# Patient Record
Sex: Female | Born: 1945 | Race: White | Hispanic: No | Marital: Married | Smoking: Former smoker
Health system: Southern US, Community
[De-identification: ages and names within clinical notes are randomized; demographics above are authoritative.]

## PROBLEM LIST (undated history)

## (undated) DIAGNOSIS — I4729 Other ventricular tachycardia: Secondary | ICD-10-CM

## (undated) DIAGNOSIS — I5022 Chronic systolic (congestive) heart failure: Secondary | ICD-10-CM

## (undated) DIAGNOSIS — H40009 Preglaucoma, unspecified, unspecified eye: Secondary | ICD-10-CM

## (undated) DIAGNOSIS — Z9289 Personal history of other medical treatment: Secondary | ICD-10-CM

## (undated) DIAGNOSIS — I428 Other cardiomyopathies: Secondary | ICD-10-CM

## (undated) DIAGNOSIS — I2510 Atherosclerotic heart disease of native coronary artery without angina pectoris: Secondary | ICD-10-CM

## (undated) DIAGNOSIS — J189 Pneumonia, unspecified organism: Secondary | ICD-10-CM

## (undated) DIAGNOSIS — I471 Supraventricular tachycardia: Secondary | ICD-10-CM

## (undated) DIAGNOSIS — Z8719 Personal history of other diseases of the digestive system: Secondary | ICD-10-CM

## (undated) DIAGNOSIS — J302 Other seasonal allergic rhinitis: Secondary | ICD-10-CM

## (undated) DIAGNOSIS — T8859XA Other complications of anesthesia, initial encounter: Secondary | ICD-10-CM

## (undated) DIAGNOSIS — G629 Polyneuropathy, unspecified: Secondary | ICD-10-CM

## (undated) DIAGNOSIS — R000 Tachycardia, unspecified: Secondary | ICD-10-CM

## (undated) DIAGNOSIS — R0600 Dyspnea, unspecified: Secondary | ICD-10-CM

## (undated) DIAGNOSIS — Z8701 Personal history of pneumonia (recurrent): Secondary | ICD-10-CM

## (undated) DIAGNOSIS — I447 Left bundle-branch block, unspecified: Secondary | ICD-10-CM

## (undated) DIAGNOSIS — T4145XA Adverse effect of unspecified anesthetic, initial encounter: Secondary | ICD-10-CM

## (undated) DIAGNOSIS — Z9889 Other specified postprocedural states: Secondary | ICD-10-CM

## (undated) DIAGNOSIS — Z95810 Presence of automatic (implantable) cardiac defibrillator: Secondary | ICD-10-CM

## (undated) DIAGNOSIS — I472 Ventricular tachycardia: Secondary | ICD-10-CM

## (undated) DIAGNOSIS — I4719 Other supraventricular tachycardia: Secondary | ICD-10-CM

## (undated) DIAGNOSIS — R112 Nausea with vomiting, unspecified: Secondary | ICD-10-CM

## (undated) DIAGNOSIS — M069 Rheumatoid arthritis, unspecified: Secondary | ICD-10-CM

## (undated) DIAGNOSIS — E119 Type 2 diabetes mellitus without complications: Secondary | ICD-10-CM

## (undated) DIAGNOSIS — D649 Anemia, unspecified: Secondary | ICD-10-CM

## (undated) DIAGNOSIS — E785 Hyperlipidemia, unspecified: Secondary | ICD-10-CM

## (undated) DIAGNOSIS — I4711 Inappropriate sinus tachycardia, so stated: Secondary | ICD-10-CM

## (undated) DIAGNOSIS — N189 Chronic kidney disease, unspecified: Secondary | ICD-10-CM

## (undated) DIAGNOSIS — E0590 Thyrotoxicosis, unspecified without thyrotoxic crisis or storm: Secondary | ICD-10-CM

## (undated) DIAGNOSIS — K219 Gastro-esophageal reflux disease without esophagitis: Secondary | ICD-10-CM

## (undated) DIAGNOSIS — M109 Gout, unspecified: Secondary | ICD-10-CM

## (undated) DIAGNOSIS — I219 Acute myocardial infarction, unspecified: Secondary | ICD-10-CM

## (undated) DIAGNOSIS — H3530 Unspecified macular degeneration: Secondary | ICD-10-CM

## (undated) DIAGNOSIS — I10 Essential (primary) hypertension: Secondary | ICD-10-CM

## (undated) HISTORY — DX: Type 2 diabetes mellitus without complications: E11.9

## (undated) HISTORY — PX: FRACTURE SURGERY: SHX138

## (undated) HISTORY — DX: Inappropriate sinus tachycardia, so stated: I47.11

## (undated) HISTORY — PX: EYE SURGERY: SHX253

## (undated) HISTORY — PX: FOOT SURGERY: SHX648

## (undated) HISTORY — PX: BACK SURGERY: SHX140

## (undated) HISTORY — PX: TONSILLECTOMY: SUR1361

## (undated) HISTORY — PX: APPENDECTOMY: SHX54

## (undated) HISTORY — DX: Other cardiomyopathies: I42.8

## (undated) HISTORY — DX: Rheumatoid arthritis, unspecified: M06.9

## (undated) HISTORY — DX: Atherosclerotic heart disease of native coronary artery without angina pectoris: I25.10

## (undated) HISTORY — DX: Other ventricular tachycardia: I47.29

## (undated) HISTORY — PX: HERNIA REPAIR: SHX51

## (undated) HISTORY — DX: Preglaucoma, unspecified, unspecified eye: H40.009

## (undated) HISTORY — PX: ABDOMINAL HYSTERECTOMY: SHX81

## (undated) HISTORY — DX: Supraventricular tachycardia: I47.1

## (undated) HISTORY — DX: Polyneuropathy, unspecified: G62.9

## (undated) HISTORY — DX: Other supraventricular tachycardia: I47.19

## (undated) HISTORY — DX: Chronic systolic (congestive) heart failure: I50.22

## (undated) HISTORY — PX: PTCA: SHX146

## (undated) HISTORY — DX: Tachycardia, unspecified: R00.0

## (undated) HISTORY — DX: Left bundle-branch block, unspecified: I44.7

## (undated) HISTORY — DX: Essential (primary) hypertension: I10

## (undated) HISTORY — DX: Ventricular tachycardia: I47.2

## (undated) HISTORY — PX: CHOLECYSTECTOMY: SHX55

## (undated) HISTORY — DX: Anemia, unspecified: D64.9

## (undated) HISTORY — DX: Hyperlipidemia, unspecified: E78.5

## (undated) MED FILL — Ferumoxytol Inj 510 MG/17ML (30 MG/ML) (Elemental Fe): INTRAVENOUS | Qty: 17 | Status: AC

---

## 1994-11-20 HISTORY — PX: CORONARY ARTERY BYPASS GRAFT: SHX141

## 2001-10-20 ENCOUNTER — Emergency Department (HOSPITAL_COMMUNITY): Admission: EM | Admit: 2001-10-20 | Discharge: 2001-10-20 | Payer: Self-pay | Admitting: Emergency Medicine

## 2002-04-24 ENCOUNTER — Ambulatory Visit (HOSPITAL_COMMUNITY): Admission: RE | Admit: 2002-04-24 | Discharge: 2002-04-24 | Payer: Self-pay | Admitting: Family Medicine

## 2002-04-24 ENCOUNTER — Encounter: Payer: Self-pay | Admitting: Family Medicine

## 2004-02-24 ENCOUNTER — Ambulatory Visit (HOSPITAL_COMMUNITY): Admission: RE | Admit: 2004-02-24 | Discharge: 2004-02-24 | Payer: Self-pay

## 2004-03-29 ENCOUNTER — Encounter: Admission: RE | Admit: 2004-03-29 | Discharge: 2004-06-27 | Payer: Self-pay | Admitting: Family Medicine

## 2004-04-28 ENCOUNTER — Ambulatory Visit (HOSPITAL_COMMUNITY): Admission: RE | Admit: 2004-04-28 | Discharge: 2004-04-28 | Payer: Self-pay | Admitting: Family Medicine

## 2004-08-25 ENCOUNTER — Encounter: Admission: RE | Admit: 2004-08-25 | Discharge: 2004-11-23 | Payer: Self-pay | Admitting: Family Medicine

## 2004-12-12 ENCOUNTER — Ambulatory Visit: Payer: Self-pay | Admitting: Internal Medicine

## 2004-12-19 ENCOUNTER — Ambulatory Visit: Payer: Self-pay

## 2005-02-08 ENCOUNTER — Ambulatory Visit: Payer: Self-pay | Admitting: Cardiovascular Disease

## 2005-02-13 ENCOUNTER — Ambulatory Visit: Payer: Self-pay | Admitting: Cardiology

## 2005-02-24 ENCOUNTER — Ambulatory Visit (HOSPITAL_COMMUNITY): Admission: RE | Admit: 2005-02-24 | Discharge: 2005-02-24 | Payer: Self-pay | Admitting: Family Medicine

## 2005-03-06 ENCOUNTER — Encounter: Admission: RE | Admit: 2005-03-06 | Discharge: 2005-03-06 | Payer: Self-pay | Admitting: Family Medicine

## 2005-08-02 ENCOUNTER — Ambulatory Visit: Payer: Self-pay | Admitting: Cardiology

## 2005-08-07 ENCOUNTER — Ambulatory Visit: Payer: Self-pay | Admitting: Cardiology

## 2005-08-31 ENCOUNTER — Encounter: Admission: RE | Admit: 2005-08-31 | Discharge: 2005-08-31 | Payer: Self-pay | Admitting: Family Medicine

## 2006-02-05 ENCOUNTER — Ambulatory Visit: Payer: Self-pay | Admitting: Cardiology

## 2006-02-08 ENCOUNTER — Ambulatory Visit: Payer: Self-pay | Admitting: Cardiology

## 2006-02-27 ENCOUNTER — Encounter: Admission: RE | Admit: 2006-02-27 | Discharge: 2006-02-27 | Payer: Self-pay | Admitting: Family Medicine

## 2006-07-24 ENCOUNTER — Ambulatory Visit: Payer: Self-pay | Admitting: Cardiology

## 2006-07-26 ENCOUNTER — Ambulatory Visit: Payer: Self-pay | Admitting: Cardiology

## 2006-10-21 ENCOUNTER — Emergency Department (HOSPITAL_COMMUNITY): Admission: EM | Admit: 2006-10-21 | Discharge: 2006-10-21 | Payer: Self-pay | Admitting: Emergency Medicine

## 2007-02-25 ENCOUNTER — Ambulatory Visit: Payer: Self-pay

## 2007-02-25 LAB — CONVERTED CEMR LAB
ALT: 21 units/L (ref 0–40)
AST: 24 units/L (ref 0–37)
Albumin: 4 g/dL (ref 3.5–5.2)
Alkaline Phosphatase: 36 units/L — ABNORMAL LOW (ref 39–117)
Bilirubin, Direct: 0.1 mg/dL (ref 0.0–0.3)
Cholesterol: 105 mg/dL (ref 0–200)
HDL: 34.7 mg/dL — ABNORMAL LOW (ref >39.0)
LDL Cholesterol: 40 mg/dL (ref 0–99)
Total Bilirubin: 0.7 mg/dL (ref 0.3–1.2)
Total CHOL/HDL Ratio: 3
Total Protein: 7.5 g/dL (ref 6.0–8.3)
Triglycerides: 152 mg/dL — ABNORMAL HIGH (ref 0–149)
VLDL: 30 mg/dL (ref 0–40)

## 2007-02-28 ENCOUNTER — Ambulatory Visit: Payer: Self-pay | Admitting: Cardiology

## 2007-03-04 ENCOUNTER — Ambulatory Visit (HOSPITAL_COMMUNITY): Admission: RE | Admit: 2007-03-04 | Discharge: 2007-03-04 | Payer: Self-pay | Admitting: Family Medicine

## 2007-08-26 ENCOUNTER — Ambulatory Visit: Payer: Self-pay | Admitting: Cardiology

## 2007-08-26 LAB — CONVERTED CEMR LAB
ALT: 21 units/L (ref 0–35)
AST: 27 units/L (ref 0–37)
Albumin: 4 g/dL (ref 3.5–5.2)
Alkaline Phosphatase: 38 units/L — ABNORMAL LOW (ref 39–117)
Bilirubin, Direct: 0.1 mg/dL (ref 0.0–0.3)
Cholesterol: 112 mg/dL (ref 0–200)
HDL: 30 mg/dL — ABNORMAL LOW (ref >39.0)
LDL Cholesterol: 51 mg/dL (ref 0–99)
Total Bilirubin: 0.9 mg/dL (ref 0.3–1.2)
Total CHOL/HDL Ratio: 3.7
Total Protein: 7.4 g/dL (ref 6.0–8.3)
Triglycerides: 156 mg/dL — ABNORMAL HIGH (ref 0–149)
VLDL: 31 mg/dL (ref 0–40)

## 2007-08-29 ENCOUNTER — Ambulatory Visit: Payer: Self-pay | Admitting: Cardiology

## 2007-12-19 ENCOUNTER — Ambulatory Visit: Payer: Self-pay

## 2007-12-19 LAB — CONVERTED CEMR LAB
ALT: 23 units/L (ref 0–35)
AST: 24 units/L (ref 0–37)
Albumin: 4 g/dL (ref 3.5–5.2)
Alkaline Phosphatase: 32 units/L — ABNORMAL LOW (ref 39–117)
Bilirubin, Direct: 0.2 mg/dL (ref 0.0–0.3)
Cholesterol: 112 mg/dL (ref 0–200)
HDL: 30.2 mg/dL — ABNORMAL LOW (ref >39.0)
LDL Cholesterol: 46 mg/dL (ref 0–99)
Total Bilirubin: 0.8 mg/dL (ref 0.3–1.2)
Total CHOL/HDL Ratio: 3.7
Total Protein: 7.2 g/dL (ref 6.0–8.3)
Triglycerides: 177 mg/dL — ABNORMAL HIGH (ref 0–149)
VLDL: 35 mg/dL (ref 0–40)

## 2008-01-02 ENCOUNTER — Ambulatory Visit: Payer: Self-pay | Admitting: Cardiology

## 2008-08-14 ENCOUNTER — Encounter: Admission: RE | Admit: 2008-08-14 | Discharge: 2008-08-14 | Payer: Self-pay | Admitting: Family Medicine

## 2008-08-24 ENCOUNTER — Emergency Department (HOSPITAL_COMMUNITY): Admission: EM | Admit: 2008-08-24 | Discharge: 2008-08-24 | Payer: Self-pay | Admitting: Emergency Medicine

## 2008-09-01 ENCOUNTER — Encounter: Admission: RE | Admit: 2008-09-01 | Discharge: 2008-09-01 | Payer: Self-pay | Admitting: Neurological Surgery

## 2009-04-23 ENCOUNTER — Encounter: Admission: RE | Admit: 2009-04-23 | Discharge: 2009-04-23 | Payer: Self-pay | Admitting: Family Medicine

## 2009-07-07 ENCOUNTER — Encounter: Admission: RE | Admit: 2009-07-07 | Discharge: 2009-07-07 | Payer: Self-pay | Admitting: Family Medicine

## 2009-07-28 DIAGNOSIS — Z951 Presence of aortocoronary bypass graft: Secondary | ICD-10-CM | POA: Insufficient documentation

## 2009-07-28 DIAGNOSIS — E785 Hyperlipidemia, unspecified: Secondary | ICD-10-CM | POA: Insufficient documentation

## 2009-07-28 DIAGNOSIS — E119 Type 2 diabetes mellitus without complications: Secondary | ICD-10-CM | POA: Insufficient documentation

## 2009-07-28 DIAGNOSIS — Z9889 Other specified postprocedural states: Secondary | ICD-10-CM | POA: Insufficient documentation

## 2009-07-28 DIAGNOSIS — Z9089 Acquired absence of other organs: Secondary | ICD-10-CM | POA: Insufficient documentation

## 2010-02-01 ENCOUNTER — Encounter: Admission: RE | Admit: 2010-02-01 | Discharge: 2010-02-01 | Payer: Self-pay | Admitting: Family Medicine

## 2010-02-02 ENCOUNTER — Ambulatory Visit: Payer: Self-pay | Admitting: Cardiology

## 2010-02-02 DIAGNOSIS — R0602 Shortness of breath: Secondary | ICD-10-CM | POA: Insufficient documentation

## 2010-02-10 ENCOUNTER — Encounter (HOSPITAL_COMMUNITY): Admission: RE | Admit: 2010-02-10 | Discharge: 2010-03-12 | Payer: Self-pay | Admitting: Cardiology

## 2010-02-10 ENCOUNTER — Ambulatory Visit: Payer: Self-pay | Admitting: Cardiology

## 2010-12-22 NOTE — Letter (Signed)
Summary: [place] [facility] (Nuc Med Stress)  [facility]  [address], [place] [address]   Phone: [phone]  Fax: [phone]    Nuclear Medicine 1-Day Stress Test Information Sheet  Re:     Katherine Larson   DOB:     23-Nov-1945 MRN:     [id number] Weight:  Appointment Date: Register at: Appointment Time: Referring MD:  ___Exercise Stress  __Adenosine   __Dobutamine  _X_Lexiscan  __Persantine   __Thallium  Urgency: ____1 (next day)   ____2 (one week)    ____3 (PRN)  Patient will receive Follow Up call with results: Patient needs follow-up appointment:  Instructions regarding medication:  How to prepare for your stress test: 1. DO NOT eat or dring 6 hours prior to your arrival time. This includes no caffeine (coffee, tea, sodas, chocolate) if you were instructed to take your medications, drink water with it. 2. DO NOT use any tobacco products for at leaset 8 hours prior to arrival. 3. DO NOT wear dresses or any clothing that may have metal clasps or buttons. 4. Wear short sleeve shirts, loose clothing, and comfortalbe walking shoes. 5. DO NOT use lotions, oils or powder on your chest before the test. 6. The test will take approximately 3-4 hours from the time you arrive until completion. 7. To register the day of the test, go to the Short Stay entrance at [facility]. 8. If you must cancel your test, call [phone] as soon as you are aware. 9. Please do not take Metformin the night before or the morning of stress test.  After you arrive for test:   When you arrive at [facility], you will go to Short Stay to be registered. They will then send you to Radiology to check in. The Nuclear Medicine Tech will get you and start an IV in your arm or hand. A small amount of a radioactive tracer will then be injected into your IV. This tracer will then have to circulate for 30-45 minutes. During this time you will wait in the waiting room and you will  be able to drink something without caffeine. A series of pictures will be taken of your heart follwoing this waiting period. After the 1st set of pictures you will go to the stress lab to get ready for your stress test. During the stress test, another small amount of a radioactive tracer will be injected through your IV. When the stress test is complete, there is a short rest period while your heart rate and blood pressure will be monitored. When this monitoring period is complete you will have another set of pictrues taken. (The same as the 1st set of pictures). These pictures are taken between 15 minutes and 1 hour after the stress test. The time depends on the type of stress test you had. Your doctor will inform you of your test results within 7 days after test.    The possibilities of certain changes are possible during the test. They include abnormal blood pressure and disorders of the heart. Side effects of persantine or adenosine can include flushing, chest pain, shortness of breath, stomach tightness, headache and light-headedness. These side effects usually do not last long and are self-resolving. Every effort will be made to keep you comfortable and to minimize complications by obtaining a medical history and by close observation during the test. Emergency equipment, medications, and trained personnel are available to deal with any unusual situation which may arise.  Please notify office at least 48 hours in advance if you are unable to keep this appt.

## 2010-12-22 NOTE — Assessment & Plan Note (Signed)
Summary: per pt phone call to re-establish/tg  Medications Added AMITRIPTYLINE HCL 10 MG TABS (AMITRIPTYLINE HCL) 1 tab two times a day PREDNISONE 10 MG TABS (PREDNISONE) take 2 tabs daily VICODIN 5-500 MG TABS (HYDROCODONE-ACETAMINOPHEN) take 1 tab at bedtime SINGULAIR 10 MG TABS (MONTELUKAST SODIUM) take 1 tab daily SYMBICORT 160-4.5 MCG/ACT AERO (BUDESONIDE-FORMOTEROL FUMARATE) use 2puffs two times a day METOPROLOL SUCCINATE 50 MG XR24H-TAB (METOPROLOL SUCCINATE) take 1 tablet by mouth once daily      Allergies Added: ! PENICILLIN ! BIAXIN  Visit Type:  Follow-up Primary Provider:  cynthia Larson [employer] [address])  CC:  sob due to asthma.  History of Present Illness: Katherine Larson comes in today after a prolonged absence from our practice.  Her primary care physician, Dr. Cliffton Larson, told her she needed to come back to see [place].  She had coronary bypass grafting in 1996. I do not have any details.  At that time she was a smoker. She quit on that day.  Her other risk factors include diabetes, hypertension, obesity, and family history.  Other than dyspnea on exertion and shortness of breath, she does not have any symptoms of ischemia. She has not had a functional study in years.  She has orthopnea, PND or peripheral edema. She's had no palpitations, syncope.  Current Medications (verified): 1)  Niaspan 1000 Mg Cr-Tabs (Niacin (Antihyperlipidemic)) .... 2 Tab At Bedtime 2)  Amitriptyline Hcl 10 Mg Tabs (Amitriptyline Hcl) .Marland Kitchen.. 1 Tab Two Times A Day 3)  Metoclopramide Hcl 5 Mg Tabs (Metoclopramide Hcl) .Marland Kitchen.. 1 Tab Two Times A Day 4)  Aspirin Ec 325 Mg Tbec (Aspirin) .... Take One Tablet By Mouth Daily 5)  Crestor 20 Mg Tabs (Rosuvastatin Calcium) .Marland Kitchen.. 1 Tab Once Daily 6)  Tricor 145 Mg Tabs (Fenofibrate) .Marland Kitchen.. 1 Tab Once Daily 7)  Hyzaar 100-25 Mg Tabs (Losartan Potassium-Hctz) .Marland Kitchen.. 1 Tab Once Daily 8)  Fish Oil 1000 Mg Caps (Omega-3 Fatty Acids) .Marland Kitchen.. 1 Cap Three Times A Day 9)   Metformin Hcl 1000 Mg Tabs (Metformin Hcl) .... 2 Tabs Two Times A Day 10)  Actos 45 Mg Tabs (Pioglitazone Hcl) .Marland Kitchen.. 1 Tab Once Daily 11)  Allegra 180 Mg Tabs (Fexofenadine Hcl) .Marland Kitchen.. 1 Tab Once Daily 12)  Prednisone 10 Mg Tabs (Prednisone) .... Take 2 Tabs Daily 13)  Vicodin 5-500 Mg Tabs (Hydrocodone-Acetaminophen) .... Take 1 Tab At Bedtime 14)  Singulair 10 Mg Tabs (Montelukast Sodium) .... Take 1 Tab Daily 15)  Symbicort 160-4.5 Mcg/act Aero (Budesonide-Formoterol Fumarate) .... Use 2puffs Two Times A Day 16)  Metoprolol Succinate 50 Mg Xr24h-Tab (Metoprolol Succinate) .... Take 1 Tablet By Mouth Once Daily  Allergies (verified): 1)  ! Penicillin 2)  ! Biaxin  Past History:  Past Medical History: Last updated: 08/05/2009 VENTRICULAR TACHYCARDIA HX OF NONSUSTAINED (ICD-427.1) DIABETES MELLITUS (ICD-250.00) CORONARY ARTERY BYPASS GRAFT, HX OF (ICD-V45.81) CORONARY ARTERY DISEASE (ICD-414.00) HYPERLIPIDEMIA (ICD-272.4)    Past Surgical History: Last updated: 08/05/09 HERNIORRHAPHY, HX OF (ICD-V45.89) CHOLECYSTECTOMY, HX OF (ICD-V45.79) HYSTERECTOMY, HX OF (ICD-V88.01) PERCUTANEOUS TRANSLUMINAL CORONARY ANGIOPLASTY, HX OF (ICD-V45.82)  Family History: Last updated: August 05, 2009 Father:Died at age 26 of MI Mother:Hx of Mi age 31  Social History: Last updated: 05-Aug-2009 Married   Review of Systems       nother than history of present illness  Vital Signs:  Patient profile:   65 year old female Height:      64 inches Weight:      199 pounds BMI:  34.28 Pulse rate:   109 / minute BP sitting:   131 / 75  (right arm)  Vitals Entered By: Katherine Saa, CNA (February 02, 2010 10:43 AM)  Physical Exam  General:  obese.  obese.   Head:  normocephalic and atraumatic Eyes:  wears glass Mouth:  Teeth, gums and palate normal. Oral mucosa normal. Neck:  Neck supple, no JVD. No masses, thyromegaly or abnormal cervical nodes. Chest Katherine Larson:  well-healed median  sternotomy Lungs:  Clear bilaterally to auscultation and percussion. Heart:  PMI nondisplaced,normal S1-S2, no gallop or rub. Her heart rates running about 100 210. EKG confirmed sinus tachycardia Abdomen:  Bowel sounds positive; abdomen soft and non-tender without masses, organomegaly, or hernias noted. No hepatosplenomegaly. Msk:  Back normal, normal gait. Muscle strength and tone normal. Pulses:  pulses normal in all 4 extremities Extremities:  No clubbing or cyanosis. Neurologic:  Alert and oriented x 3. Skin:  Intact without lesions or rashes. Psych:  Normal affect.   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 With her dyspnea on exertion and shortness of breath, not to mention her grafting 65 years old, will repeat a study. We can assess left ventricular systolic function and a question of whether she's had an anterior Katherine Larson MI in the past. Regardless, she needs to be on a beta blocker with her history of coronary artery disease, resting tachycardia, history of nonsustained VT. I prescribed metoprolol succinate 50 mg a day Her updated medication list for this problem includes:    Aspirin Ec 325 Mg Tbec (Aspirin) .Marland Kitchen... Take one tablet by mouth daily    Metoprolol Succinate 50 Mg Xr24h-tab (Metoprolol succinate) .Marland Kitchen... Take 1 tablet by mouth once daily  Problem # 2:  DYSPNEA (ICD-786.05) Assessment: Deteriorated  Her updated medication list for this problem includes:    Aspirin Ec 325 Mg Tbec (Aspirin) .Marland Kitchen... Take one tablet by mouth daily    Hyzaar 100-25 Mg Tabs (Losartan potassium-hctz) .Marland Kitchen... 1 tab  once daily    Metoprolol Succinate 50 Mg Xr24h-tab (Metoprolol succinate) .Marland Kitchen... Take 1 tablet by mouth once daily  Orders: Nuclear Stress Test (Nuc Stress Test)  Problem # 3:  VENTRICULAR TACHYCARDIA HX OF NONSUSTAINED (ICD-427.1) Assessment: Unchanged  Her updated medication list for this problem includes:    Aspirin Ec 325 Mg Tbec (Aspirin) .Marland Kitchen... Take one tablet by mouth daily    Metoprolol Succinate 50 Mg Xr24h-tab (Metoprolol succinate) .Marland Kitchen... Take 1 tablet by mouth once daily  Problem # 4:  PERCUTANEOUS TRANSLUMINAL CORONARY ANGIOPLASTY, HX OF (ICD-V45.82) Assessment: Unchanged  Problem # 5:  DIABETES MELLITUS (ICD-250.00)  Her updated medication list for this problem includes:    Aspirin Ec 325 Mg Tbec (Aspirin) .Marland Kitchen... Take one tablet by mouth daily    Hyzaar 100-25 Mg Tabs (Losartan potassium-hctz) .Marland Kitchen... 1 tab once daily    Metformin Hcl 1000 Mg Tabs (Metformin hcl) .Marland Kitchen... 2 tabs two times a day    Actos 45 Mg Tabs (Pioglitazone hcl) .Marland Kitchen... 1 tab once daily  Problem # 6:  HYPERLIPIDEMIA (ICD-272.4)  Her updated medication list for this problem  includes:    Niaspan 1000 Mg Cr-tabs (Niacin (antihyperlipidemic)) .Marland Kitchen... 2 tab at bedtime    Crestor 20 Mg Tabs (Rosuvastatin calcium) .Marland Kitchen... 1 tab once daily    Tricor 145 Mg Tabs (Fenofibrate) .Marland Kitchen... 1 tab once daily  Patient Instructions: 1)  Your physician recommends that you schedule a follow-up appointment in: 2 years 2)  Your physician has requested that you have an adenosine myoview.  For further information please visit [email or web address]  Please follow instruction sheet, as given. 3)  Your physician has recommended you make the following change in your medication: Start taking  Metoprolol 50mg  by mouth once daily  Prescriptions: METOPROLOL SUCCINATE 50 MG XR24H-TAB (METOPROLOL SUCCINATE) take 1 tablet by mouth once daily  #30 x 11   Entered by:   Larita Fife Via LPN   Authorized by:   Gaylord Shih, MD, [facility]   Signed by:   Larita Fife Via  LPN on 04/54/0981   Method used:   Electronically to        [facility] [phone]* (retail)       [address] [place], [place]  [address]       Ph: [phone]       Fax: [phone]   RxID:   [id number]   Appended Document: per pt phone call to re-establish/tg good. no change in treatment.

## 2011-03-14 ENCOUNTER — Institutional Professional Consult (permissible substitution): Payer: Self-pay | Admitting: Internal Medicine

## 2011-04-04 NOTE — Assessment & Plan Note (Signed)
[facility]                               LIPID CLINIC NOTE   Katherine Larson, Katherine Larson                      MRN:          [id number]  DATE:08/29/2007                            DOB:          [birth year]    CARDIOLOGIST:  Katherine Sans. Wall, MD, [facility].   Katherine Larson is seen back in the lipid clinic for further evaluation,  medication titration associated with her hyperlipidemia.  She states  that she expects her numbers are not perfect today as she has he denies  less walking activity.  She has had no weakness, fatigue, or other  problems.  She has been following a low fat, low cholesterol diet.  She  does not smoke.   PAST MEDICAL HISTORY:  Pertinent for:  1. Hyperlipidemia.  2. Documented coronary disease, status post coronary artery bypass      grafting in 1996.   CURRENT MEDICATIONS:  1. Amitriptyline 10 mg daily at bedtime.  2. Aspirin 325 mg daily.  3. Niaspan 2 grams daily at bedtime.  4. Crestor 20 mg daily.  5. TriCor 145 mg daily.  6. Hyzaar 100/25, one tablet daily.  7. Fish oil 3 grams daily at bedtime.  8. Metformin 2 grams twice daily with meals.  9. Actos 45 mg p.o. daily.  10.Allegra 180 mg daily.  11.Metoclopramide 5 mg daily prior to a meal.  12.She has sublingual nitroglycerin which she has not used since her      last visit.   REVIEW OF SYMPTOMS:  As stated above and otherwise negative.   VITAL SIGNS:  Weight today in the office is 218 pounds, blood pressure  is 124/70, respirations are 16 , heart rate is 68.   LABORATORY:  Reveals normal LFTs.  A total cholesterol pretty much  unchanged.  Triglycerides are 156, HDL cholesterol is 30, and LDL is at  goal.   ASSESSMENT:  The patient meets primary and secondary goals for lipid-  lowering therapies at this time.  Her HDL has dropped probably due to  her decreased exercise.   The patient will work on this, continue her medications, samples have  been given to facilitate  compliance.  She will follow up in 3-4 months.      Shelby Dubin, PharmD, BCPS, CPP  Electronically Signed      Rollene Rotunda, MD, [facility]  Electronically Signed   MP/MedQ  DD: 08/29/2007  DT: 08/29/2007  Job #: [id number]   cc:   Katherine C. Wall, MD, [facility]

## 2011-04-04 NOTE — Assessment & Plan Note (Signed)
[facility]                               LIPID CLINIC NOTE   VERNICA, WACHTEL                      MRN:          [id number]  DATE:01/02/2008                            DOB:          1946/01/22    [name] is seen back in lipid clinic for further evaluation and  medication titration, associated with her hyperlipidemia.  She has been  feeling and doing well, overall.  She has had no muscle aches, pains,  weakness, fatigue or other problems.  She is seen today with Idalia Needle, PharmD candidate.  Ms. Messing states that she has been walking,  though this has been somewhat limited, due to sickness in her extended  family and the cold weather.  She has been working, though, to improve  her overall level of activity.  She continues to follow a low-fat, low-  cholesterol diet.   PAST MEDICAL HISTORY:  Pertinent for hyperlipidemia.   CURRENT MEDICATIONS INCLUDE:  1. Amitriptyline 10 mg daily at bedtime.  2. Aspirin 325 mg daily.  3. Niaspan 2 g daily at bedtime.  4. Crestor 20 mg daily.  5. Tricor 145 mg daily.  6. Hyzaar 100/25 daily.  7. Fish oil 3 g daily at bedtime.  8. Metformin 2 g twice daily with meals.  9. Actos 45 mg daily.  10.Allegra 180 mg daily.  11.Metoclopramide 5 mg daily before meals.   REVIEW OF SYSTEMS:  As stated in the HPI and otherwise negative.   PHYSICAL EXAM:  The patient is well-appearing, in no acute distress  today.  Weight today in the office is 184 pounds.  Blood pressure is 122/64,  heart rate is 86, respirations are 16.   LABORATORY DATA:  Appended to the chart.   ASSESSMENT:  The patient is on close to maximal therapy at this time and  is not at all goals.  However, exercise has fallen off somewhat, which  has always done a good job on Mrs. Blumenfeld's HDL cholesterol.   PLAN:  We will re-work to improve exercise.  We will continue current  therapies.  We will follow up in three months.      Shelby Dubin, PharmD, BCPS, CPP  Electronically Signed      Rollene Rotunda, MD, [facility]  Electronically Signed   MP/MedQ  DD: 01/09/2008  DT: 01/10/2008  Job #: [id number]   cc:   Thomas C. Wall, MD, [facility]

## 2011-04-07 NOTE — Assessment & Plan Note (Signed)
[facility]                              CARDIOLOGY OFFICE NOTE   CARLENE, BICKLEY                      MRN:          [id number]  DATE:07/26/2006                            DOB:          Aug 25, 1946    Return office visit for lipid clinic.   PAST MEDICAL HISTORY:  1. Coronary artery disease, status post coronary artery bypass graft.  2. Hyperlipidemia.  3. Diabetes mellitus.   MEDICATIONS:  1. Amitriptyline 10 mg daily.  2. Metoclopramide 5 mg twice daily.  3. Aspirin 325 mg daily.  4. Niaspan 2 gram daily.  5. Crestor 20 mg daily.  6. TriCor 145 mg daily.  7. Hyzaar 100/25 mg daily.  8. Fish oil 3 grams daily.  9. Metformin 2 grams twice daily with meals.  10.Actos 45 mg daily.  11.Allegra 180 mg daily.   VITAL SIGNS:  Weight 187 pounds, blood pressure 120/68, heart rate 80.   LABORATORY DATA:  Total cholesterol 106, triglyceride 153, HDL 30, LDL 45.  LFTs within normal limits.   ASSESSMENT:  Katherine Larson is a very pleasant woman who returns to the lipid  clinic today with no chest pain, no shortness of breath, no muscle aches or  pains.  She states compliances with current medication regimen; however, she  is very noncompliant with her exercise regimen.  Given the extreme heat over  the last 2 months or so, she has not been able to walk outside as she had  previously been doing; therefore, she has had about a 10-pound weight gain  since March when I last saw her.  She says that she has continued on her low-  fat diet.  She is low carbohydrates and low salt, no fried foods.  She  grills everything at home and eats steamed vegetables with very little  seasoning or sodium added to it.  Occasionally she does eat a grilled  chicken sandwich as fast food but not as rare.  I have encouraged her to  restart her exercise regimen, a minimal of twice a week, going to [employer],  which is the closest large inside building to her, and walking  for about 20  minutes prior to her beginning her shopping.  She said that she will begin  doing that and, once the weather starts cooling off, she will restart her  exercise regimen outside.  I have challenged her that in the next 6 months,  prior to her next visit, she lose the 10 pounds that she had gained in the  previous 6 months, to continue her low-fat diet.  Her total cholesterol is  at goal of less than 200, triglycerides just above goal of less than 150,  HDL is less than goal of greater than 40, and LDL is at goal of less than  70.   PLAN:  1. Continue current medication regimen.  2. Restart exercise regimen.  3. Continue low-fat diet.  4. Followup visit in 6 months for lipid panel and LFTs and make      adjustments that will be needed at that time.  Leota Sauers, PharmD                            Jesse Sans. Daleen Squibb, MD, [facility]   LC/MedQ  DD:  07/26/2006  DT:  07/26/2006  Job #:  [id number]

## 2011-04-07 NOTE — Assessment & Plan Note (Signed)
[facility]                               LIPID CLINIC NOTE   Katherine Larson, Katherine Larson                      MRN:          [id number]  DATE:02/28/2007                            DOB:          January 03, 1946    Katherine Larson comes in today for followup of her hypercholesterolemia.  She has been compliant and tolerating her current cholesterol medicines,  which include:  1. Niaspan 2 gm daily.  2. Crestor 20 mg daily.  3. Tricor 145 mg daily.  4. Fish oil 3 gm daily.   Other medications have not changed.  The include:  1. Amitriptyline.  2. Metoclopramide.  3. Aspirin 325 mg.  4. Hyzaar 10/25.  5. Metformin.  6. Actos.  7. Allegra.   PHYSICAL EXAMINATION:  Includes a weight of 192 pounds.  Blood pressure  is 130/84.  Heart rate is 88.  Laboratory data includes total cholesterol of 106, triglycerides 153,  HDL 30, LDL 45.  Liver function tests are within normal limits.  Katherine Larson has been continuing to try to follow a heart-healthy diet,  but has been eating more fast food lately due to some construction on  their house.  She admits to not exercising much lately, not walking as  much as she had in the past, partially due to her husband not being able  to join her due to gout, which has resolved now.  Her weight is up by 5 pounds from her previous visit.  Triglycerides are  about the same, and slightly above the goal of less than 160.  HDL is  down from 34.7 to 30, and is not at goal of greater than 40.  LDL is 45,  which is at goal of less than 70.   PLAN:  Continue the same medications.  We encouraged her to continue to  choose low-fat diet options, and minimize the amount of fast food.  We  asked her to increase her exercise back to her previous 30 minutes per  day.  Patient agreed to that, and also has set a personal goal of losing  10 to 15 pounds by her next appointment with [place].  We scheduled followup  lipid and liver panel in 6 months.  We are  hoping that continuing with  current medications, and improving  lifestyle choices will drive her HDL up, and triglycerides down further.  This patient was seen along with Vickie Epley, PharmD candidate, and  De Hollingshead, PharmD candidate.      Charolotte Eke, PharmD  Electronically Signed      Rollene Rotunda, MD, [facility]  Electronically Signed   TP/MedQ  DD: 02/28/2007  DT: 02/28/2007  Job #: [id number]

## 2011-04-07 NOTE — Assessment & Plan Note (Signed)
[facility]                                 ON-CALL NOTE   RASHELL, SHAMBAUGH                      MRN:          [id number]  DATE:03/30/2007                            DOB:          09-09-1946    Patient of Dr. Daleen Squibb.   TELEPHONE NUMBER:  [phone]   Katherine Larson called this evening regarding pain under her rib. Katherine Larson states that  Katherine Larson has had a cold for the past several days. Katherine Larson was seen by her  primary care physician who told her Katherine Larson has bronchitis. Katherine Larson has been  prescribed Levaquin which Katherine Larson has been taking and also ibuprofen and  Advair inhaler. With this, Katherine Larson has had improvement of her symptoms, but  Katherine Larson continues to cough frequently. Katherine Larson reports that when Katherine Larson coughs Katherine Larson  has pain underneath her ribs. Katherine Larson thought that this was potentially some  chest discomfort; and says Katherine Larson did take a nitroglycerin which may have  slightly improved the pain, however it persisted with coughing. Katherine Larson is  calling in this evening regarding this discomfort. 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 Katherine Larson has done quite  well since then. Katherine Larson has not had any angina and Katherine Larson has been able to  maintain her normal activities without limitations due to chest  discomfort. Her main complaint currently is this coughing and rib pain.  I explained to Ms. Celmer that it sounds like her rib discomfort is  most likely related to her current bronchitis and muscle irritation from  coughing and the infection. However, if Katherine Larson is concerned about it or if  the pain persists and does not improve with her current medical regimen,  then Katherine Larson should come in and be evaluated. If Katherine Larson has any further  questions, Katherine Larson is instructed to call me or to go directly to the  emergency room for further evaluation.     Lorain Childes, MD  Electronically Signed    CGF/MedQ  DD: 03/30/2007  DT: 03/31/2007   Job #: [id number]   cc:   Thomas C. Wall, MD, [facility]

## 2011-04-21 ENCOUNTER — Institutional Professional Consult (permissible substitution): Payer: Self-pay | Admitting: Internal Medicine

## 2011-05-25 ENCOUNTER — Institutional Professional Consult (permissible substitution): Payer: Self-pay | Admitting: Internal Medicine

## 2011-06-23 ENCOUNTER — Institutional Professional Consult (permissible substitution): Payer: Self-pay | Admitting: Internal Medicine

## 2011-08-22 LAB — CBC
HCT: 34.6 — ABNORMAL LOW
Hemoglobin: 12
MCHC: 34.7
MCV: 90.5
Platelets: 261
RBC: 3.83 — ABNORMAL LOW
RDW: 14.5
WBC: 9

## 2011-08-22 LAB — COMPREHENSIVE METABOLIC PANEL
ALT: 18
AST: 23
Albumin: 4.2
Alkaline Phosphatase: 35 — ABNORMAL LOW
BUN: 15
CO2: 22
Calcium: 9.6
Chloride: 103
Creatinine, Ser: 0.75
GFR calc Af Amer: >60
GFR calc non Af Amer: >60
Glucose, Bld: 134 — ABNORMAL HIGH
Potassium: 3.6
Sodium: 138
Total Bilirubin: 0.7
Total Protein: 7.5

## 2011-08-22 LAB — POCT CARDIAC MARKERS
CKMB, poc: <1 — ABNORMAL LOW
Myoglobin, poc: 143
Troponin i, poc: <0.05

## 2011-08-22 LAB — DIFFERENTIAL
Basophils Absolute: 0
Basophils Relative: 0
Eosinophils Absolute: 0.2
Eosinophils Relative: 2
Lymphocytes Relative: 30
Lymphs Abs: 2.7
Monocytes Absolute: 0.6
Monocytes Relative: 6
Neutro Abs: 5.6
Neutrophils Relative %: 62

## 2011-08-25 ENCOUNTER — Telehealth: Payer: Self-pay | Admitting: Cardiology

## 2011-08-25 NOTE — Telephone Encounter (Signed)
[facility] called regarding patient needing to be off asa for 5 days for a knee procedure. Nurse was made aware that this message will be send to MD and his nurse's desktop for recommendation.

## 2011-08-25 NOTE — Telephone Encounter (Signed)
pls fax to sara at [phone] needs to go off asa 5 days , left knee orthoscopic

## 2011-08-28 ENCOUNTER — Encounter: Payer: Self-pay | Admitting: *Deleted

## 2011-08-28 ENCOUNTER — Telehealth: Payer: Self-pay | Admitting: *Deleted

## 2011-08-28 ENCOUNTER — Telehealth: Payer: Self-pay | Admitting: Cardiology

## 2011-08-28 NOTE — Telephone Encounter (Signed)
PT IS HAVING RIGHTZ KNEE ARTHROSCOPY AND NEEDS TO COME OFF ASPRIN FROM NOW TILL Thursday.

## 2011-08-28 NOTE — Telephone Encounter (Signed)
Pt needs a note faxed to Agustin Cree [phone] stating ok for her to have arthroscopy.  They have to have something in writing.

## 2011-08-28 NOTE — Telephone Encounter (Signed)
I spoke with Dr. Daleen Squibb about pt upcoming arthroscopic knee surgery agrees with plan. Mylo Red RN

## 2011-08-28 NOTE — Telephone Encounter (Signed)
I spoke with pt and she has not been having any anginal symptoms.  She is due to see Dr. Daleen Squibb 01/2012. She is having a torn cartilage repaired on Thursday.  She will stop her Aspirin today and restart after surgery per md. Mylo Red RN

## 2011-08-28 NOTE — Telephone Encounter (Signed)
I have called surgical office and was unable to receive an answer to whom would be performing surgery in order to fax clearance form.  I spoke to Katherine Larson earlier this morning and she will stop her aspirin today.  I called Katherine Larson back and Katherine Larson states Dr. Jerl Santos is doing surgery. Clearance note faxed and Dr. Daleen Squibb is aware. Mylo Red RN

## 2011-08-29 NOTE — Telephone Encounter (Signed)
Okay to stop ASA for procedure. Will need to start back as soon as possible after ok by orthopedist.  Joni Reining NP

## 2011-08-31 ENCOUNTER — Encounter: Payer: Self-pay | Admitting: Physician Assistant

## 2011-08-31 ENCOUNTER — Ambulatory Visit (INDEPENDENT_AMBULATORY_CARE_PROVIDER_SITE_OTHER): Payer: Commercial Managed Care - PPO | Admitting: Physician Assistant

## 2011-08-31 VITALS — BP 150/90 | HR 118 | Resp 18 | Ht 64.0 in | Wt 188.0 lb

## 2011-08-31 DIAGNOSIS — I498 Other specified cardiac arrhythmias: Secondary | ICD-10-CM

## 2011-08-31 DIAGNOSIS — I10 Essential (primary) hypertension: Secondary | ICD-10-CM

## 2011-08-31 DIAGNOSIS — E785 Hyperlipidemia, unspecified: Secondary | ICD-10-CM

## 2011-08-31 DIAGNOSIS — R000 Tachycardia, unspecified: Secondary | ICD-10-CM | POA: Insufficient documentation

## 2011-08-31 DIAGNOSIS — I2510 Atherosclerotic heart disease of native coronary artery without angina pectoris: Secondary | ICD-10-CM

## 2011-08-31 MED ORDER — METOPROLOL SUCCINATE ER 50 MG PO TB24: 50.0000 mg | ORAL_TABLET | Freq: Every day | ORAL | Status: DC

## 2011-08-31 NOTE — Assessment & Plan Note (Signed)
She had low risk Myoview last year. Remain on ASA. As above, if echo ok and HR better with Toprol, I would suspect she can be cleared for surgery.

## 2011-08-31 NOTE — Assessment & Plan Note (Signed)
Etiology not clear.  This is not a new problem.  Question if heart rate driven by formoterol.  Beta blocker was recommended in the past and she is no longer taking.  No alarm symptoms noted.  EKG is unchanged.  I recommend she restart Toprol XL 50 mg QD.  Patient notes recent TSH with her PCP was normal.  Obtain 2D echo.  Follow up in 1 week.  Of note, she lives in [place].  She was in [place] today for her surgery only.  It is hard for her to come to [place].  Will have her follow up in [place] [facility].  If echo normal and heart rate better, she should be able to be cleared for surgery.

## 2011-08-31 NOTE — Progress Notes (Signed)
History of Present Illness: Primary Cardiologist:  Dr. Valera Castle   Katherine Larson is a 65 y.o. female who was added on to my schedule today for sinus tachycardia.  She has a h/o CAD, s/p CABG in 1996, HTN, HLP and DM2.  She last saw Dr. Daleen Squibb in [place] in 3/11 for follow up.  She had a myoview after that visit that was felt to be low risk.  She was noted to have a high basal heart rate at that time and Toprol XL 50 mg QD was added.  She has not been seen since.  She ran out of the Toprol some time ago and has not had it refilled.  She has a torn meniscus and [facility]  She was noted to have sinus tachy with a HR 128 and the surgery was cancelled.  The patient denies chest pain, shortness of breath, syncope, orthopnea, PND or significant pedal edema.  She was fairly active until her knee injury recently.  She has been able to achieve 4 METS or greater prior to her knee injury.  She denies pleuritic chest pain. No recent travels or hospitalizations.  No palpitations.    Past Medical History  Diagnosis Date  . NSVT (nonsustained ventricular tachycardia)   . DM2 (diabetes mellitus, type 2)   . CAD (coronary artery disease)     s/p CABG 1996;  Myoview 3/11: mod partially reversible AS perfusion defect, most likely related to variable breast attenuation although an element of scar with peri-infarct ischemia also possible, EF 54% - felt to be low risk  . HTN (hypertension)   . HLD (hyperlipidemia)   . Asthma   . Neuropathy     Current Outpatient Prescriptions  Medication Sig Dispense Refill  . amitriptyline (ELAVIL) 10 MG tablet Take 10 mg by mouth 2 (two) times daily.        Marland Kitchen aspirin 325 MG tablet Take 325 mg by mouth daily.        . budesonide-formoterol (SYMBICORT) 160-4.5 MCG/ACT inhaler Inhale 2 puffs into the lungs 2 (two) times daily.        . fenofibrate (TRICOR) 145 MG tablet Take 145 mg by mouth daily.        . fexofenadine  (ALLEGRA) 180 MG tablet Take 180 mg by mouth daily.        Marland Kitchen HYDROcodone-acetaminophen (VICODIN) 5-500 MG per tablet Take 1 tablet by mouth at bedtime.        Marland Kitchen losartan-hydrochlorothiazide (HYZAAR) 100-25 MG per tablet Take 1 tablet by mouth daily.        . metFORMIN (GLUCOPHAGE) 1000 MG tablet Take 2,000 mg by mouth 2 (two) times daily with a meal.        . metoCLOPramide (REGLAN) 5 MG tablet Take 5 mg by mouth 2 (two) times daily.        . montelukast (SINGULAIR) 10 MG tablet Take 10 mg by mouth at bedtime.        . niacin (NIASPAN) 1000 MG CR tablet Take 2,000 mg by mouth at bedtime.        . nitroGLYCERIN (NITROSTAT) 0.4 MG SL tablet Place 0.4 mg under the tongue every 5 (five) minutes as needed.        . Omega-3 Fatty Acids (FISH OIL) 1000 MG CAPS Take 1 capsule by mouth 3 (three) times daily.        . pioglitazone (ACTOS) 45 MG tablet Take 45 mg  by mouth daily.        . rosuvastatin (CRESTOR) 20 MG tablet Take 20 mg by mouth daily.        . metoprolol (TOPROL XL) 50 MG 24 hr tablet Take 1 tablet (50 mg total) by mouth daily.  30 tablet  6    Allergies: Allergies  Allergen Reactions  . Clarithromycin   . Penicillins     Social hx:  Non smoker  ROS:  Please see the history of present illness.  No reported snoring or apneic episodes.  No daytime hypersomnolence.  All other systems reviewed and negative.   Vital Signs: BP 150/90  Pulse 118  Resp 18  Ht 5\' 4"  (1.626 m)  Wt 188 lb (85.276 kg)  BMI 32.27 kg/m2  PHYSICAL EXAM: Well nourished, well developed, in no acute distress HEENT: normal Neck: no JVD at 90 degrees Cardiac:  normal S1, S2; RRR; no murmur; no gallops Lungs:  clear to auscultation bilaterally, no wheezing, rhonchi or rales Abd: soft, nontender Ext: no edema Skin: warm and dry Neuro:  CNs 2-12 intact, no focal abnormalities noted Psych: normal affect  EKG:  Sinus tachy, HR 119, normal axis, NSSTTW changes, no significant change since prior  tracing  ASSESSMENT AND PLAN:

## 2011-08-31 NOTE — Patient Instructions (Signed)
Your physician recommends that you schedule a follow-up appointment in: 1 WEEK IN [place]   Your physician has recommended you make the following change in your medication: START TOPROL XL 50 MG 1 TABLET DAILY  Your physician has requested that you have an echocardiogram DX SINUS TACHYCARDIA 785.0 THIS IS TO BE DONE IN THE [place] OFFICE PER PT REQUEST. Echocardiography is a painless test that uses sound waves to create images of your heart. It provides your doctor with information about the size and shape of your heart and how well your heart's chambers and valves are working. This procedure takes approximately one hour. There are no restrictions for this procedure.

## 2011-08-31 NOTE — Assessment & Plan Note (Signed)
Managed by PCP

## 2011-09-01 ENCOUNTER — Telehealth: Payer: Self-pay | Admitting: Cardiology

## 2011-09-01 ENCOUNTER — Ambulatory Visit (HOSPITAL_COMMUNITY)
Admission: RE | Admit: 2011-09-01 | Discharge: 2011-09-01 | Disposition: A | Discharge status: Home / Self Care | Payer: Commercial Managed Care - PPO | Source: Ambulatory Visit | Attending: Physician Assistant | Admitting: Physician Assistant

## 2011-09-01 DIAGNOSIS — E119 Type 2 diabetes mellitus without complications: Secondary | ICD-10-CM | POA: Insufficient documentation

## 2011-09-01 DIAGNOSIS — I517 Cardiomegaly: Secondary | ICD-10-CM

## 2011-09-01 DIAGNOSIS — I2510 Atherosclerotic heart disease of native coronary artery without angina pectoris: Secondary | ICD-10-CM | POA: Insufficient documentation

## 2011-09-01 DIAGNOSIS — Z01810 Encounter for preprocedural cardiovascular examination: Secondary | ICD-10-CM | POA: Insufficient documentation

## 2011-09-01 DIAGNOSIS — I10 Essential (primary) hypertension: Secondary | ICD-10-CM

## 2011-09-01 NOTE — Telephone Encounter (Signed)
I called [facility] in [place]. Prescription was picked up yesterday. I spoke with pt husband. He states that it was not sent in. We discussed the name of the medication again.  He found that he had picked up the medication along with his own prescriptions. Pt will start toprol today. Mylo Red RN

## 2011-09-01 NOTE — Progress Notes (Signed)
*  PRELIMINARY RESULTS* Echocardiogram 2D Echocardiogram has been performed.  Conrad Whitfield 09/01/2011, 11:53 AM

## 2011-09-01 NOTE — Telephone Encounter (Signed)
Pt saw Scott weaver yesterday and a rx for metoprolol [facility] [place] [address] and of this morning it is not there please call

## 2011-09-05 ENCOUNTER — Ambulatory Visit (INDEPENDENT_AMBULATORY_CARE_PROVIDER_SITE_OTHER): Payer: Commercial Managed Care - PPO | Admitting: Adult Health

## 2011-09-05 ENCOUNTER — Encounter: Payer: Self-pay | Admitting: Adult Health

## 2011-09-05 VITALS — BP 124/65 | HR 73 | Resp 18 | Ht 64.0 in | Wt 195.0 lb

## 2011-09-05 DIAGNOSIS — I2510 Atherosclerotic heart disease of native coronary artery without angina pectoris: Secondary | ICD-10-CM

## 2011-09-05 NOTE — Progress Notes (Signed)
HPI: Katherine Larson is a 65 y/o patient of Dr.Wall we are seeing ongoing treatment and assessment of CAD with CABG in 1996, Hypertension, HLP, and diabetes. She is to be scheduled for Left knee meniscus repair per Dr. Fara Chute at [facility]. She was seen by Tereso Newcomer, PA on Aug 31, 2011 after pre-operative evaluation by Dr. Fara Chute because she was found to be very tachycardic.  She had not been taking Toprol as directed as she did not have a Rx for this.  On visit with Mr. Alben Spittle, Katherine Larson, she was restarted on Toprol XL 50 mg daily. A 2-D echo was ordered. She is here discuss results, check status and be cleared for surgery.    She is doing well and has had no complaints of tachycardia or palpitations. She is tolerating Toprol well.  She continues to have pain in her Left knee.  Allergies  Allergen Reactions  . Clarithromycin   . Penicillins     Current Outpatient Prescriptions  Medication Sig Dispense Refill  . amitriptyline (ELAVIL) 10 MG tablet Take 10 mg by mouth 2 (two) times daily.        Marland Kitchen aspirin 325 MG tablet Take 325 mg by mouth daily.        . budesonide-formoterol (SYMBICORT) 160-4.5 MCG/ACT inhaler Inhale 2 puffs into the lungs 2 (two) times daily.        . fenofibrate (TRICOR) 145 MG tablet Take 145 mg by mouth daily.        . fexofenadine (ALLEGRA) 180 MG tablet Take 180 mg by mouth daily.        Marland Kitchen HYDROcodone-acetaminophen (VICODIN) 5-500 MG per tablet Take 1 tablet by mouth at bedtime.        Marland Kitchen losartan-hydrochlorothiazide (HYZAAR) 100-25 MG per tablet Take 1 tablet by mouth daily.        . metFORMIN (GLUCOPHAGE) 1000 MG tablet Take 2,000 mg by mouth 2 (two) times daily with a meal.        . metoCLOPramide (REGLAN) 5 MG tablet Take 5 mg by mouth 2 (two) times daily.        . metoprolol (TOPROL XL) 50 MG 24 hr tablet Take 1 tablet (50 mg total) by mouth daily.  30 tablet  6  . montelukast (SINGULAIR) 10 MG tablet Take 10 mg by mouth at bedtime.        . niacin (NIASPAN)  1000 MG CR tablet Take 2,000 mg by mouth at bedtime.        . nitroGLYCERIN (NITROSTAT) 0.4 MG SL tablet Place 0.4 mg under the tongue every 5 (five) minutes as needed.        . Omega-3 Fatty Acids (FISH OIL) 1000 MG CAPS Take 1 capsule by mouth 3 (three) times daily.        . pioglitazone (ACTOS) 45 MG tablet Take 45 mg by mouth daily.        . rosuvastatin (CRESTOR) 20 MG tablet Take 20 mg by mouth daily.          Past Medical History  Diagnosis Date  . NSVT (nonsustained ventricular tachycardia)   . DM2 (diabetes mellitus, type 2)   . CAD (coronary artery disease)     s/p CABG 1996;  Myoview 3/11: mod partially reversible AS perfusion defect, most likely related to variable breast attenuation although an element of scar with peri-infarct ischemia also possible, EF 54% - felt to be low risk  . HTN (hypertension)   . HLD (hyperlipidemia)   .  Asthma   . Neuropathy     Past Surgical History  Procedure Date  . Hernia repair   . Cholecystectomy   . Vesicovaginal fistula closure w/ tah   . Ptca     WUJ:WJXBJY of systems complete and found to be negative unless listed above PHYSICAL EXAM BP 124/65  Pulse 73  Resp 18  Ht 5\' 4"  (1.626 m)  Wt 88.451 kg (195 lb)  BMI 33.47 kg/m2  General: Well developed, well nourished, in no acute distress Obese Head: Eyes PERRLA, No xanthomas.   Normal cephalic and atramatic  Lungs: Clear bilaterally to auscultation and percussion. Heart: HRRR S1 S2, without MRG.  Pulses are 2+ & equal.            No carotid bruit. No JVD.  No abdominal bruits. No femoral bruits. Abdomen: Bowel sounds are positive, abdomen soft and non-tender without masses or                  Hernia's noted. Msk:  Back normal, normal gait. Normal strength and tone for age. Extremities: No clubbing, cyanosis or edema.  DP +1 soreness with moving left knee and leg. Neuro: Alert and oriented X 3. Psych:  Good affect, responds appropriately    ASSESSMENT AND PLAN

## 2011-09-05 NOTE — Patient Instructions (Signed)
**Note De-identified Yader Criger Obfuscation** Your physician recommends that you continue on your current medications as directed. Please refer to the Current Medication list given to you today.  Your physician recommends that you schedule a follow-up appointment in: 4 months  

## 2011-09-05 NOTE — Assessment & Plan Note (Signed)
Heart rate and BP are well controlled on Toprol XL 50 mg daily. She has had a recent stress myoview in March of 2011 which was negative for ischemia. Review of her echo demonstrates Normal EF of 55%-60% with grade I diastolic dysfx. Mild AoV calcifications.  She is cleared for surgery from a cardiac standpoint. She is advised to continue taking all medications as directed perioperatively, especially the BB.  She verbalizes understanding. She will follow-up with [place] in 4 months.

## 2011-10-16 ENCOUNTER — Encounter: Payer: Self-pay | Admitting: Physician Assistant

## 2011-11-21 HISTORY — PX: COLONOSCOPY: SHX174

## 2011-11-21 HISTORY — PX: ESOPHAGOGASTRODUODENOSCOPY: SHX1529

## 2012-01-26 ENCOUNTER — Ambulatory Visit (INDEPENDENT_AMBULATORY_CARE_PROVIDER_SITE_OTHER): Payer: Commercial Managed Care - PPO | Admitting: Cardiology

## 2012-01-26 ENCOUNTER — Encounter: Payer: Self-pay | Admitting: Cardiology

## 2012-01-26 VITALS — BP 130/86 | HR 86 | Ht 64.0 in | Wt 188.0 lb

## 2012-01-26 DIAGNOSIS — E119 Type 2 diabetes mellitus without complications: Secondary | ICD-10-CM

## 2012-01-26 DIAGNOSIS — Z951 Presence of aortocoronary bypass graft: Secondary | ICD-10-CM

## 2012-01-26 DIAGNOSIS — E785 Hyperlipidemia, unspecified: Secondary | ICD-10-CM

## 2012-01-26 DIAGNOSIS — I2510 Atherosclerotic heart disease of native coronary artery without angina pectoris: Secondary | ICD-10-CM

## 2012-01-26 MED ORDER — NITROGLYCERIN 0.4 MG SL SUBL: 0.4000 mg | SUBLINGUAL_TABLET | SUBLINGUAL | Status: DC | PRN

## 2012-01-26 NOTE — Progress Notes (Signed)
Addended by: Reather Laurence A on: 01/26/2012 11:02 AM   Modules accepted: Orders

## 2012-01-26 NOTE — Patient Instructions (Signed)
Your physician recommends that you schedule a follow-up appointment in: 2 years  

## 2012-01-26 NOTE — Assessment & Plan Note (Signed)
Stable. Has done well with taking good care of herself. RTC in 2 years.

## 2012-01-26 NOTE — Progress Notes (Signed)
HPI Katherine Larson returns for the evaluation and management of her CAD. She is totally asx with no CP, DOE, or other ischemic equivalents.  Her last HgbA1c was 6%. No evidence of any diabetic complications. Very compliant. Followed by Dr C. White of [facility].  Past Medical History  Diagnosis Date  . NSVT (nonsustained ventricular tachycardia)   . DM2 (diabetes mellitus, type 2)   . CAD (coronary artery disease)     s/p CABG 1996;  Myoview 3/11: mod partially reversible AS perfusion defect, most likely related to variable breast attenuation although an element of scar with peri-infarct ischemia also possible, EF 54% - felt to be low risk  . HTN (hypertension)   . HLD (hyperlipidemia)   . Asthma   . Neuropathy     Current Outpatient Prescriptions  Medication Sig Dispense Refill  . amitriptyline (ELAVIL) 10 MG tablet Take 10 mg by mouth 2 (two) times daily.        Marland Kitchen aspirin 325 MG tablet Take 325 mg by mouth daily.        . budesonide-formoterol (SYMBICORT) 160-4.5 MCG/ACT inhaler Inhale 2 puffs into the lungs 2 (two) times daily.        . fenofibrate (TRICOR) 145 MG tablet Take 145 mg by mouth daily.        . fexofenadine (ALLEGRA) 180 MG tablet Take 180 mg by mouth daily.        Marland Kitchen HYDROcodone-acetaminophen (VICODIN) 5-500 MG per tablet Take 1 tablet by mouth at bedtime.        Marland Kitchen losartan-hydrochlorothiazide (HYZAAR) 100-25 MG per tablet Take 1 tablet by mouth daily.        . metFORMIN (GLUCOPHAGE) 1000 MG tablet Take 2,000 mg by mouth 2 (two) times daily with a meal.        . metoCLOPramide (REGLAN) 5 MG tablet Take 5 mg by mouth 2 (two) times daily.        . metoprolol (TOPROL XL) 50 MG 24 hr tablet Take 1 tablet (50 mg total) by mouth daily.  30 tablet  6  . montelukast (SINGULAIR) 10 MG tablet Take 10 mg by mouth at bedtime.        . niacin (NIASPAN) 1000 MG CR tablet Take 2,000 mg by mouth at bedtime.        . nitroGLYCERIN (NITROSTAT) 0.4 MG SL tablet Place 0.4 mg under the tongue  every 5 (five) minutes as needed.        . Omega-3 Fatty Acids (FISH OIL) 1000 MG CAPS Take 1 capsule by mouth 3 (three) times daily.        . pioglitazone (ACTOS) 45 MG tablet Take 45 mg by mouth daily.        . rosuvastatin (CRESTOR) 20 MG tablet Take 20 mg by mouth daily.          Allergies  Allergen Reactions  . Clarithromycin   . Penicillins     Family History  Problem Relation Age of Onset  . Heart attack Father   . Heart attack Mother     History   Social History  . Marital Status: Married    Spouse Name: N/A    Number of Children: N/A  . Years of Education: N/A   Occupational History  . Not on file.   Social History Main Topics  . Smoking status: Former Smoker    Quit date: 11/20/1994  . Smokeless tobacco: Not on file  . Alcohol Use: No  . Drug  Use: No  . Sexually Active: Not on file   Other Topics Concern  . Not on file   Social History Narrative  . No narrative on file    ROS ALL NEGATIVE EXCEPT THOSE NOTED IN HPI  PE  General Appearance: well developed, well nourished in no acute distress, obese HEENT: symmetrical face, PERRLA, good dentition  Neck: no JVD, thyromegaly, or adenopathy, trachea midline Chest: symmetric without deformity Cardiac: PMI non-displaced, RRR, normal S1, S2, no gallop or murmur Lung: clear to ausculation and percussion VascularLE pulses reduced but present, good capillary refill Abdominal: nondistended, nontender, good bowel sounds, no HSM, no bruits Extremities: no cyanosis, clubbing or edema, no sign of DVT, no varicosities  Skin: normal color, no rashes Neuro: alert and oriented x 3, non-focal Pysch: normal affect  EKG  BMET    Component Value Date/Time   NA 138 08/24/2008 1510   K 3.6 08/24/2008 1510   CL 103 08/24/2008 1510   CO2 22 08/24/2008 1510   GLUCOSE 134* 08/24/2008 1510   BUN 15 08/24/2008 1510   CREATININE 0.75 08/24/2008 1510   CALCIUM 9.6 08/24/2008 1510   GFRNONAA >60 08/24/2008 1510   GFRAA  Value:  >60        The eGFR has been calculated using the MDRD equation. This calculation has not been validated in all clinical 08/24/2008 1510    Lipid Panel     Component Value Date/Time   CHOL 112 12/19/2007 0910   TRIG 177* 12/19/2007 0910   HDL 30.2* 12/19/2007 0910   CHOLHDL 3.7 CALC 12/19/2007 0910   VLDL 35 12/19/2007 0910   LDLCALC 46 12/19/2007 0910    CBC    Component Value Date/Time   WBC 9.0 08/24/2008 1510   RBC 3.83* 08/24/2008 1510   HGB 12.0 08/24/2008 1510   HCT 34.6* 08/24/2008 1510   PLT 261 08/24/2008 1510   MCV 90.5 08/24/2008 1510   MCHC 34.7 08/24/2008 1510   RDW 14.5 08/24/2008 1510   LYMPHSABS 2.7 08/24/2008 1510   MONOABS 0.6 08/24/2008 1510   EOSABS 0.2 08/24/2008 1510   BASOSABS 0.0 08/24/2008 1510

## 2012-01-31 ENCOUNTER — Encounter: Payer: Self-pay | Admitting: Cardiology

## 2012-04-09 ENCOUNTER — Other Ambulatory Visit: Payer: Self-pay | Admitting: Physician Assistant

## 2012-06-06 ENCOUNTER — Other Ambulatory Visit: Payer: Self-pay | Admitting: Family Medicine

## 2012-06-06 DIAGNOSIS — M545 Low back pain, unspecified: Secondary | ICD-10-CM

## 2012-06-07 ENCOUNTER — Ambulatory Visit
Admission: RE | Admit: 2012-06-07 | Discharge: 2012-06-07 | Disposition: A | Discharge status: Home / Self Care | Payer: Commercial Managed Care - PPO | Source: Ambulatory Visit | Attending: Family Medicine | Admitting: Family Medicine

## 2012-06-07 DIAGNOSIS — M545 Low back pain, unspecified: Secondary | ICD-10-CM

## 2012-08-12 ENCOUNTER — Other Ambulatory Visit: Payer: Self-pay | Admitting: Family Medicine

## 2012-08-12 DIAGNOSIS — D649 Anemia, unspecified: Secondary | ICD-10-CM

## 2012-08-15 ENCOUNTER — Ambulatory Visit
Admission: RE | Admit: 2012-08-15 | Discharge: 2012-08-15 | Disposition: A | Discharge status: Home / Self Care | Payer: Commercial Managed Care - PPO | Source: Ambulatory Visit | Attending: Family Medicine | Admitting: Family Medicine

## 2012-08-15 DIAGNOSIS — D649 Anemia, unspecified: Secondary | ICD-10-CM

## 2012-08-15 MED ORDER — IOHEXOL 300 MG/ML  SOLN
100.0000 mL | Freq: Once | INTRAMUSCULAR | Status: AC | PRN
  Administered 2012-08-15: 100 mL via INTRAVENOUS

## 2012-11-02 ENCOUNTER — Other Ambulatory Visit: Payer: Self-pay | Admitting: Physician Assistant

## 2012-11-20 HISTORY — PX: GIVENS CAPSULE STUDY: SHX5432

## 2012-11-28 ENCOUNTER — Telehealth: Payer: Self-pay | Admitting: Internal Medicine

## 2012-11-28 NOTE — Telephone Encounter (Signed)
LVOM for pt return call in re to referral.

## 2012-12-02 ENCOUNTER — Telehealth: Payer: Self-pay | Admitting: Internal Medicine

## 2012-12-02 NOTE — Telephone Encounter (Signed)
C/D 12/02/12 for appt.12/17/12

## 2012-12-16 ENCOUNTER — Other Ambulatory Visit: Payer: Self-pay | Admitting: Gastroenterology

## 2012-12-16 ENCOUNTER — Other Ambulatory Visit: Payer: Self-pay | Admitting: Medical Oncology

## 2012-12-16 ENCOUNTER — Ambulatory Visit
Admission: RE | Admit: 2012-12-16 | Discharge: 2012-12-16 | Disposition: A | Discharge status: Home / Self Care | Payer: Commercial Managed Care - PPO | Source: Ambulatory Visit | Attending: Gastroenterology | Admitting: Gastroenterology

## 2012-12-16 DIAGNOSIS — K625 Hemorrhage of anus and rectum: Secondary | ICD-10-CM

## 2012-12-16 DIAGNOSIS — T189XXA Foreign body of alimentary tract, part unspecified, initial encounter: Secondary | ICD-10-CM

## 2012-12-16 NOTE — Progress Notes (Signed)
erroneous

## 2012-12-17 ENCOUNTER — Other Ambulatory Visit (HOSPITAL_BASED_OUTPATIENT_CLINIC_OR_DEPARTMENT_OTHER): Payer: Commercial Managed Care - PPO | Admitting: Lab

## 2012-12-17 ENCOUNTER — Encounter: Payer: Self-pay | Admitting: Internal Medicine

## 2012-12-17 ENCOUNTER — Other Ambulatory Visit: Payer: Self-pay | Admitting: Internal Medicine

## 2012-12-17 ENCOUNTER — Ambulatory Visit: Payer: Commercial Managed Care - PPO

## 2012-12-17 ENCOUNTER — Ambulatory Visit (HOSPITAL_BASED_OUTPATIENT_CLINIC_OR_DEPARTMENT_OTHER): Payer: Commercial Managed Care - PPO | Admitting: Internal Medicine

## 2012-12-17 ENCOUNTER — Telehealth: Payer: Self-pay | Admitting: Internal Medicine

## 2012-12-17 VITALS — BP 135/78 | HR 112 | Temp 98.4°F | Resp 20 | Ht 64.0 in | Wt 186.1 lb

## 2012-12-17 DIAGNOSIS — D649 Anemia, unspecified: Secondary | ICD-10-CM

## 2012-12-17 DIAGNOSIS — D72829 Elevated white blood cell count, unspecified: Secondary | ICD-10-CM

## 2012-12-17 DIAGNOSIS — I2510 Atherosclerotic heart disease of native coronary artery without angina pectoris: Secondary | ICD-10-CM

## 2012-12-17 DIAGNOSIS — E119 Type 2 diabetes mellitus without complications: Secondary | ICD-10-CM

## 2012-12-17 LAB — CBC & DIFF AND RETIC
BASO%: 0.2 % (ref 0.0–2.0)
Basophils Absolute: 0 10*3/uL (ref 0.0–0.1)
EOS%: 1.9 % (ref 0.0–7.0)
Eosinophils Absolute: 0.3 10*3/uL (ref 0.0–0.5)
HCT: 32.4 % — ABNORMAL LOW (ref 34.8–46.6)
HGB: 10.2 g/dL — ABNORMAL LOW (ref 11.6–15.9)
Immature Retic Fract: 27.9 % — ABNORMAL HIGH (ref 1.60–10.00)
LYMPH%: 21 % (ref 14.0–49.7)
MCH: 25.8 pg (ref 25.1–34.0)
MCHC: 31.5 g/dL (ref 31.5–36.0)
MCV: 82 fL (ref 79.5–101.0)
MONO#: 0.7 10*3/uL (ref 0.1–0.9)
MONO%: 5.3 % (ref 0.0–14.0)
NEUT#: 10 10*3/uL — ABNORMAL HIGH (ref 1.5–6.5)
NEUT%: 71.6 % (ref 38.4–76.8)
Platelets: 358 10*3/uL (ref 145–400)
RBC: 3.95 10*6/uL (ref 3.70–5.45)
RDW: 16.9 % — ABNORMAL HIGH (ref 11.2–14.5)
Retic %: 2.05 % (ref 0.70–2.10)
Retic Ct Abs: 80.98 10*3/uL (ref 33.70–90.70)
WBC: 13.9 10*3/uL — ABNORMAL HIGH (ref 3.9–10.3)
lymph#: 2.9 10*3/uL (ref 0.9–3.3)

## 2012-12-17 LAB — COMPREHENSIVE METABOLIC PANEL (CC13)
ALT: 10 U/L (ref 0–55)
AST: 14 U/L (ref 5–34)
Albumin: 3.5 g/dL (ref 3.5–5.0)
Alkaline Phosphatase: 48 U/L (ref 40–150)
BUN: 19.3 mg/dL (ref 7.0–26.0)
CO2: 21 mEq/L — ABNORMAL LOW (ref 22–29)
Calcium: 10.1 mg/dL (ref 8.4–10.4)
Chloride: 102 mEq/L (ref 98–107)
Creatinine: 0.9 mg/dL (ref 0.6–1.1)
Glucose: 115 mg/dl — ABNORMAL HIGH (ref 70–99)
Potassium: 4.3 mEq/L (ref 3.5–5.1)
Sodium: 136 mEq/L (ref 136–145)
Total Bilirubin: 0.41 mg/dL (ref 0.20–1.20)
Total Protein: 8 g/dL (ref 6.4–8.3)

## 2012-12-17 LAB — LACTATE DEHYDROGENASE (CC13): LDH: 203 U/L (ref 125–245)

## 2012-12-17 NOTE — Telephone Encounter (Signed)
appt made and printed for pt aom °

## 2012-12-17 NOTE — Progress Notes (Signed)
[facility] [phone]   [phone]  CONSULT NOTE  REFERRING PHYSICIAN: Dr. Laurann Montana  REASON FOR CONSULTATION:  67 years old white female with anemia and leukocytosis.  HPI Katherine Larson is a 67 y.o. female was past medical history significant for coronary heart disease status post myocardial infarction, ventricular tachycardia, diabetes mellitus, hypertension, dyslipidemia, asthma and peripheral neuropathy. The patient was seen by her primary care physician Dr. Cliffton Asters for routine evaluation in October of 2013 and CBC on 09/16/2012 showed white blood count of 10.5, hemoglobin 9.3, hematocrit 30.0% and platelets count of 281,000. Repeat CBC on 11/04/2012 showed elevated white blood count of 12.0, hemoglobin 10.1, hematocrit 30.7% and platelets count of 304,000. The patient was referred to me today for further evaluation and recommendation regarding her persistent anemia and leukocytosis. 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 She also had fecal Hemoccult positive test. She underwent gastrointestinal evaluation by Dr. Ewing Schlein. She had upper endoscopy and colonoscopy performed last year that was unremarkable according to the patient. She also recently underwent capsule endoscopy but she did not pass the capsule out. A recent abdominal x-ray showed a radiopaque object overlies the right colon with 2 radiopaque structures overlying the distal transverse colon. This may be related to ingested capsule. The patient is currently on oral iron tablets in the form of Nu-iron 1 tablet by mouth daily and she has been taking it for the last year. She denied having any significant complaints today except for intermittent lower abdominal pain as well as fatigue and arthralgia. She has occasional lightheadedness especially when she changes position.   @SFHPI @  Past Medical History  Diagnosis Date  . NSVT  (nonsustained ventricular tachycardia)   . DM2 (diabetes mellitus, type 2)   . CAD (coronary artery disease)     s/p CABG 1996;  Myoview 3/11: mod partially reversible AS perfusion defect, most likely related to variable breast attenuation although an element of scar with peri-infarct ischemia also possible, EF 54% - felt to be low risk  . HTN (hypertension)   . HLD (hyperlipidemia)   . Asthma   . Neuropathy     Past Surgical History  Procedure Date  . Hernia repair   . Cholecystectomy   . Vesicovaginal fistula closure w/ tah   . Ptca     Family History  Problem Relation Age of Onset  . Heart attack Father   . Heart attack Mother     Social History History  Substance Use Topics  . Smoking status: Former Smoker    Quit date: 11/20/1994  . Smokeless tobacco: Not on file  . Alcohol Use: No    Allergies  Allergen Reactions  . Clarithromycin   . Penicillins     Current Outpatient Prescriptions  Medication Sig Dispense Refill  . amitriptyline (ELAVIL) 10 MG tablet Take 10 mg by mouth 2 (two) times daily.        Marland Kitchen aspirin 325 MG tablet Take 325 mg by mouth daily.        . budesonide-formoterol (SYMBICORT) 160-4.5 MCG/ACT inhaler Inhale 2 puffs into the lungs 2 (two) times daily.        . fenofibrate (TRICOR) 145 MG tablet Take 145 mg by mouth daily.        . fexofenadine (ALLEGRA) 180 MG tablet Take 180 mg by mouth daily.        Marland Kitchen HYDROcodone-acetaminophen (VICODIN) 5-500 MG per tablet Take  1 tablet by mouth at bedtime.        Marland Kitchen losartan-hydrochlorothiazide (HYZAAR) 100-25 MG per tablet Take 1 tablet by mouth daily.        . metFORMIN (GLUCOPHAGE) 1000 MG tablet Take 2,000 mg by mouth 2 (two) times daily with a meal.        . metoCLOPramide (REGLAN) 5 MG tablet Take 5 mg by mouth 2 (two) times daily.        . metoprolol succinate (TOPROL-XL) 50 MG 24 hr tablet TAKE 1 TABLET (50 MG TOTAL) BY MOUTH DAILY.  30 tablet  6  . montelukast (SINGULAIR) 10 MG tablet Take 10 mg by  mouth at bedtime.        . niacin (NIASPAN) 1000 MG CR tablet Take 2,000 mg by mouth at bedtime.        . nitroGLYCERIN (NITROSTAT) 0.4 MG SL tablet Place 1 tablet (0.4 mg total) under the tongue every 5 (five) minutes as needed.  100 tablet  3  . Omega-3 Fatty Acids (FISH OIL) 1000 MG CAPS Take 1 capsule by mouth 3 (three) times daily.        . pioglitazone (ACTOS) 45 MG tablet Take 45 mg by mouth daily.        . rosuvastatin (CRESTOR) 20 MG tablet Take 20 mg by mouth daily.          Review of Systems  A comprehensive review of systems was negative except for: Constitutional: positive for fatigue Respiratory: positive for dyspnea on exertion Gastrointestinal: positive for abdominal pain Musculoskeletal: positive for arthralgias  Physical Exam  NFA:OZHYQ, healthy, no distress, well nourished and well developed SKIN: skin color, texture, turgor are normal HEAD: Normocephalic, No masses, lesions, tenderness or abnormalities EYES: normal, PERRLA EARS: External ears normal OROPHARYNX:no exudate and no erythema  NECK: supple, no adenopathy LYMPH:  no palpable lymphadenopathy, no hepatosplenomegaly BREAST:not examined LUNGS: clear to auscultation  HEART: regular rate & rhythm, no murmurs and no gallops ABDOMEN:abdomen soft, non-tender, normal bowel sounds and no masses or organomegaly BACK: Back symmetric, no curvature. EXTREMITIES:no edema, no skin discoloration, no clubbing  NEURO: alert & oriented x 3 with fluent speech, no focal motor/sensory deficits  PERFORMANCE STATUS: ECOG 1  LABORATORY DATA: Lab Results  Component Value Date   WBC 13.9* 12/17/2012   HGB 10.2* 12/17/2012   HCT 32.4* 12/17/2012   MCV 82.0 12/17/2012   PLT 358 12/17/2012      Chemistry      Component Value Date/Time   NA 136 12/17/2012 0953   NA 138 08/24/2008 1510   K 4.3 12/17/2012 0953   K 3.6 08/24/2008 1510   CL 102 12/17/2012 0953   CL 103 08/24/2008 1510   CO2 21* 12/17/2012 0953   CO2 22 08/24/2008  1510   BUN 19.3 12/17/2012 0953   BUN 15 08/24/2008 1510   CREATININE 0.9 12/17/2012 0953   CREATININE 0.75 08/24/2008 1510      Component Value Date/Time   CALCIUM 10.1 12/17/2012 0953   CALCIUM 9.6 08/24/2008 1510   ALKPHOS 48 12/17/2012 0953   ALKPHOS 35* 08/24/2008 1510   AST 14 12/17/2012 0953   AST 23 08/24/2008 1510   ALT 10 12/17/2012 0953   ALT 18 08/24/2008 1510   BILITOT 0.41 12/17/2012 0953   BILITOT 0.7 08/24/2008 1510       RADIOGRAPHIC STUDIES: Dg Abd 1 View  12/16/2012  *RADIOLOGY REPORT*  Clinical Data: Endoscopic capsule small 12/04/2012.  ABDOMEN - 1 VIEW  Comparison: 08/15/2012 CT.  Findings: Radiopaque object overlies the right colon with two radiopaque structures overlying the distal transverse colon.  This may be related to ingested capsule.  These findings are not seen on the prior CT.  Moderate amount of stool throughout the colon.  Granuloma right buttock region.  Post cholecystectomy.  Increased markings lung bases.  Mild levoscoliosis.  Vascular calcifications.  IMPRESSION: Radiopaque object overlies the right colon with two radiopaque structures overlying the distal transverse colon.  This may be related to ingested capsule.  These findings are not seen on the prior CT.  Moderate amount of stool throughout the colon.   Original Report Authenticated By: Lacy Duverney, M.D.     ASSESSMENT: This is a very pleasant 67 years old white female with leukocytosis and anemia. Her leukocytosis most likely reactive in nature especially with her recent inflammatory process and a steroid injection. Her anemia is most likely iron deficiency anemia secondary to gastrointestinal blood loss of unknown etiology not responding to oral iron preparation.  PLAN: I have a lengthy discussion with the patient today about her condition. I ordered several studies to evaluate her anemia including repeat CBC, comprehensive metabolic panel, LDH, iron study and ferritin, serum folate, B12 level, serum  erythropoietin as well as serum protein electrophoreses. For the persistent leukocytosis I will order a molecular study with BCR/ABL to rule out myeloproliferative disorder. She was advised to continue on Nu-iron for now but I may consider her for intravenous iron infusion if no improvement by her next visit. I would see the patient back for followup visit in 3 weeks for evaluation and discussion of the pending lab results. The patient was advised to call immediately if she has any concerning symptoms in the interval.  All questions were answered. The patient knows to call the clinic with any problems, questions or concerns. We can certainly see the patient much sooner if necessary.  Thank you so much for allowing me to participate in the care of Katherine Larson. I will continue to follow up the patient with you and assist in her care.  I spent 25 minutes counseling the patient face to face. The total time spent in the appointment was 50 minutes.  Evangeline Utley K. 12/17/2012, 11:00 AM

## 2012-12-17 NOTE — Progress Notes (Signed)
Checked in new pt with no financial concerns. °

## 2012-12-17 NOTE — Patient Instructions (Signed)
You have persistent anemia most likely secondary to iron deficiency from gastrointestinal bleed. You also have persistent leukocytosis most likely reactive in nature secondary to inflammatory process and recently steroid injections. I ordered several studies today to evaluate your condition. Followup in 3 week

## 2012-12-18 LAB — FOLATE: Folate: 9.7 ng/mL

## 2012-12-18 LAB — IRON AND TIBC
Iron: 76 ug/dL (ref 42–145)
UIBC: >575 ug/dL — ABNORMAL HIGH (ref 125–400)

## 2012-12-18 LAB — ERYTHROPOIETIN: Erythropoietin: 26.8 m[IU]/mL — ABNORMAL HIGH (ref 2.6–18.5)

## 2012-12-18 LAB — FERRITIN: Ferritin: 17 ng/mL (ref 10–291)

## 2012-12-18 LAB — VITAMIN B12: Vitamin B-12: 633 pg/mL (ref 211–911)

## 2012-12-19 LAB — PROTEIN ELECTROPHORESIS, SERUM
Albumin ELP: 48.8 % — ABNORMAL LOW (ref 55.8–66.1)
Alpha-1-Globulin: 5.9 % — ABNORMAL HIGH (ref 2.9–4.9)
Alpha-2-Globulin: 16.2 % — ABNORMAL HIGH (ref 7.1–11.8)
Beta 2: 6.6 % — ABNORMAL HIGH (ref 3.2–6.5)
Beta Globulin: 10.6 % — ABNORMAL HIGH (ref 4.7–7.2)
Gamma Globulin: 11.9 % (ref 11.1–18.8)
Total Protein, Serum Electrophoresis: 7.6 g/dL (ref 6.0–8.3)

## 2012-12-25 ENCOUNTER — Ambulatory Visit
Admission: RE | Admit: 2012-12-25 | Discharge: 2012-12-25 | Disposition: A | Discharge status: Home / Self Care | Payer: Medicare Other | Source: Ambulatory Visit | Attending: Gastroenterology | Admitting: Gastroenterology

## 2012-12-25 ENCOUNTER — Other Ambulatory Visit: Payer: Self-pay | Admitting: Gastroenterology

## 2012-12-25 DIAGNOSIS — IMO0002 Reserved for concepts with insufficient information to code with codable children: Secondary | ICD-10-CM

## 2013-01-08 ENCOUNTER — Other Ambulatory Visit (HOSPITAL_BASED_OUTPATIENT_CLINIC_OR_DEPARTMENT_OTHER): Payer: Medicare Other | Admitting: Lab

## 2013-01-08 ENCOUNTER — Encounter: Payer: Self-pay | Admitting: Internal Medicine

## 2013-01-08 ENCOUNTER — Telehealth: Payer: Self-pay | Admitting: Internal Medicine

## 2013-01-08 ENCOUNTER — Ambulatory Visit (HOSPITAL_BASED_OUTPATIENT_CLINIC_OR_DEPARTMENT_OTHER): Payer: Medicare Other | Admitting: Internal Medicine

## 2013-01-08 VITALS — BP 125/68 | HR 120 | Temp 98.8°F | Resp 20 | Ht 64.0 in | Wt 183.3 lb

## 2013-01-08 DIAGNOSIS — D638 Anemia in other chronic diseases classified elsewhere: Secondary | ICD-10-CM

## 2013-01-08 DIAGNOSIS — D649 Anemia, unspecified: Secondary | ICD-10-CM

## 2013-01-08 DIAGNOSIS — D72829 Elevated white blood cell count, unspecified: Secondary | ICD-10-CM

## 2013-01-08 LAB — CBC WITH DIFFERENTIAL/PLATELET
BASO%: 0.3 % (ref 0.0–2.0)
Basophils Absolute: 0 10*3/uL (ref 0.0–0.1)
EOS%: 1.3 % (ref 0.0–7.0)
Eosinophils Absolute: 0.2 10*3/uL (ref 0.0–0.5)
HCT: 30.3 % — ABNORMAL LOW (ref 34.8–46.6)
HGB: 9.9 g/dL — ABNORMAL LOW (ref 11.6–15.9)
LYMPH%: 20.1 % (ref 14.0–49.7)
MCH: 25.6 pg (ref 25.1–34.0)
MCHC: 32.5 g/dL (ref 31.5–36.0)
MCV: 78.7 fL — ABNORMAL LOW (ref 79.5–101.0)
MONO#: 0.9 10*3/uL (ref 0.1–0.9)
MONO%: 6.7 % (ref 0.0–14.0)
NEUT#: 9.6 10*3/uL — ABNORMAL HIGH (ref 1.5–6.5)
NEUT%: 71.6 % (ref 38.4–76.8)
Platelets: 453 10*3/uL — ABNORMAL HIGH (ref 145–400)
RBC: 3.85 10*6/uL (ref 3.70–5.45)
RDW: 16.4 % — ABNORMAL HIGH (ref 11.2–14.5)
WBC: 13.3 10*3/uL — ABNORMAL HIGH (ref 3.9–10.3)
lymph#: 2.7 10*3/uL (ref 0.9–3.3)

## 2013-01-08 NOTE — Patient Instructions (Signed)
No significant abnormalities on the previous bloodwork except for the persistent anemia and reactive leukocytosis. We discussed intravenous iron infusion. Followup in 2 months with repeat CBC and iron study.

## 2013-01-08 NOTE — Telephone Encounter (Signed)
gv and printed pt appt schedule for Feb and April...tia added fera

## 2013-01-08 NOTE — Progress Notes (Signed)
[facility] [phone]   [phone]  OFFICE PROGRESS NOTE  [name], MD [address] [place] [address]  DIAGNOSIS:  #1 reactive leukocytosis. #2 anemia of chronic disease plus/minus iron deficiency.  PRIOR THERAPY: None  CURRENT THERAPY:Nu-Iron 150 mg by mouth daily.  INTERVAL HISTORY: Katherine Larson 67 y.o. female returns to the clinic today for followup visit accompanied by her husband. The patient is feeling fine today with no specific complaints except for mild aching pain. She denied having any significant fatigue or weakness. She denied having any bleeding issues. She had several studies to evaluate the etiology of her leukocytosis as well as anemia and she is here today for evaluation and discussion of her lab results and recommendation regarding treatment of her condition. Serum protein electrophoreses showed no detectable M spike. Molecular study for BCR/ABL showed no detectable abnormalities. Erythropoietin was 26.8, serum iron 76, ferritin 17, folate 9.7, Vitamin B12 633.  MEDICAL HISTORY: Past Medical History  Diagnosis Date  . NSVT (nonsustained ventricular tachycardia)   . DM2 (diabetes mellitus, type 2)   . CAD (coronary artery disease)     s/p CABG 1996;  Myoview 3/11: mod partially reversible AS perfusion defect, most likely related to variable breast attenuation although an element of scar with peri-infarct ischemia also possible, EF 54% - felt to be low risk  . HTN (hypertension)   . HLD (hyperlipidemia)   . Asthma   . Neuropathy     ALLERGIES:  is allergic to biaxin and penicillins.  MEDICATIONS:  Current Outpatient Prescriptions  Medication Sig Dispense Refill  . amitriptyline (ELAVIL) 10 MG tablet Take 10 mg by mouth 2 (two) times daily.        Marland Kitchen aspirin 325 MG tablet Take 325 mg by mouth daily.        . budesonide-formoterol (SYMBICORT) 160-4.5 MCG/ACT inhaler Inhale 2 puffs into the lungs 2  (two) times daily.        . fenofibrate (TRICOR) 145 MG tablet Take 145 mg by mouth daily.        . fexofenadine (ALLEGRA) 180 MG tablet Take 180 mg by mouth daily.        Marland Kitchen HYDROcodone-acetaminophen (VICODIN) 5-500 MG per tablet Take 1 tablet by mouth at bedtime.        Marland Kitchen losartan-hydrochlorothiazide (HYZAAR) 100-25 MG per tablet Take 1 tablet by mouth daily.        . metFORMIN (GLUCOPHAGE) 1000 MG tablet Take 2,000 mg by mouth 2 (two) times daily with a meal.        . metoCLOPramide (REGLAN) 5 MG tablet Take 5 mg by mouth 2 (two) times daily.        . metoprolol succinate (TOPROL-XL) 50 MG 24 hr tablet TAKE 1 TABLET (50 MG TOTAL) BY MOUTH DAILY.  30 tablet  6  . montelukast (SINGULAIR) 10 MG tablet Take 10 mg by mouth at bedtime.        . niacin (NIASPAN) 1000 MG CR tablet Take 2,000 mg by mouth at bedtime.        . Omega-3 Fatty Acids (FISH OIL) 1000 MG CAPS Take 1 capsule by mouth 3 (three) times daily.        . pioglitazone (ACTOS) 45 MG tablet Take 45 mg by mouth daily.        . rosuvastatin (CRESTOR) 20 MG tablet Take 20 mg by mouth daily.        Marland Kitchen  nitroGLYCERIN (NITROSTAT) 0.4 MG SL tablet Place 1 tablet (0.4 mg total) under the tongue every 5 (five) minutes as needed.  100 tablet  3   No current facility-administered medications for this visit.    SURGICAL HISTORY:  Past Surgical History  Procedure Laterality Date  . Hernia repair    . Cholecystectomy    . Vesicovaginal fistula closure w/ tah    . Ptca      REVIEW OF SYSTEMS:  A comprehensive review of systems was negative except for: Constitutional: positive for fatigue   PHYSICAL EXAMINATION: General appearance: alert, cooperative and no distress Head: Normocephalic, without obvious abnormality, atraumatic Neck: no adenopathy Lymph nodes: Cervical, supraclavicular, and axillary nodes normal. Resp: clear to auscultation bilaterally Cardio: regular rate and rhythm, S1, S2 normal, no murmur, click, rub or gallop GI: soft,  non-tender; bowel sounds normal; no masses,  no organomegaly Extremities: extremities normal, atraumatic, no cyanosis or edema  ECOG PERFORMANCE STATUS: 1 - Symptomatic but completely ambulatory  Blood pressure 125/68, pulse 120, temperature 98.8 F (37.1 C), temperature source Oral, resp. rate 20, height 5\' 4"  (1.626 m), weight 183 lb 4.8 oz (83.144 kg).  LABORATORY DATA: Lab Results  Component Value Date   WBC 13.3* 01/08/2013   HGB 9.9* 01/08/2013   HCT 30.3* 01/08/2013   MCV 78.7* 01/08/2013   PLT 453* 01/08/2013      Chemistry      Component Value Date/Time   NA 136 12/17/2012 0953   NA 138 08/24/2008 1510   K 4.3 12/17/2012 0953   K 3.6 08/24/2008 1510   CL 102 12/17/2012 0953   CL 103 08/24/2008 1510   CO2 21* 12/17/2012 0953   CO2 22 08/24/2008 1510   BUN 19.3 12/17/2012 0953   BUN 15 08/24/2008 1510   CREATININE 0.9 12/17/2012 0953   CREATININE 0.75 08/24/2008 1510      Component Value Date/Time   CALCIUM 10.1 12/17/2012 0953   CALCIUM 9.6 08/24/2008 1510   ALKPHOS 48 12/17/2012 0953   ALKPHOS 35* 08/24/2008 1510   AST 14 12/17/2012 0953   AST 23 08/24/2008 1510   ALT 10 12/17/2012 0953   ALT 18 08/24/2008 1510   BILITOT 0.41 12/17/2012 0953   BILITOT 0.7 08/24/2008 1510       RADIOGRAPHIC STUDIES: Dg Abd 1 View  12/25/2012  *RADIOLOGY REPORT*  Clinical Data: Evaluate for Endoscopy capsule.  ABDOMEN - 1 VIEW  Comparison: 12/16/2012.  Findings: Scattered radiopaque densities are noted in the abdomen but none of these appear to be metallic. The bowel gas pattern is unremarkable.  The bony structures are stable.  IMPRESSION: Scattered radiodensities without definite metallic foreign body to suggest an endoscopy capsule.   Original Report Authenticated By: Rudie Meyer, M.D.    Dg Abd 1 View  12/18/2012  **ADDENDUM** CREATED: 12/18/2012 10:54:03  Original report read by Dr. Constance Goltz.  Addendum by Dr. Purcell Mouton.  I was asked to review this study on 12/18/2012. Aside from cholecystectomy clips,  there are no metallic foreign bodies within the abdomen.  The radiodensities do not have the typical appearance of an ingested small bowel capsule and are probably pill fragments. Correlation with the appearance of the administered capsule recommended.  **END ADDENDUM** SIGNED BY: Gerrianne Scale, M.D.   12/16/2012  *RADIOLOGY REPORT*  Clinical Data: Endoscopic capsule small 12/04/2012.  ABDOMEN - 1 VIEW  Comparison: 08/15/2012 CT.  Findings: Radiopaque object overlies the right colon with two radiopaque structures overlying the distal transverse colon.  This may be related to ingested capsule.  These findings are not seen on the prior CT.  Moderate amount of stool throughout the colon.  Granuloma right buttock region.  Post cholecystectomy.  Increased markings lung bases.  Mild levoscoliosis.  Vascular calcifications.  IMPRESSION: Radiopaque object overlies the right colon with two radiopaque structures overlying the distal transverse colon.  This may be related to ingested capsule.  These findings are not seen on the prior CT.  Moderate amount of stool throughout the colon.   Original Report Authenticated By: Lacy Duverney, M.D.     ASSESSMENT: This is a very pleasant 67 years old white female with reactive leukocytosis as well as anemia of chronic disease plus/minus iron deficiency. The patient is currently on Nu-iron orally but was no significant improvement in her anemia.  PLAN: I discussed the lab result with the patient and her husband today. I recommended for her to consider intravenous iron infusion in the form of Feraheme 510 mg intravenously x2 doses of weekly basis. I discussed with the patient adverse effect of this treatment including the risk of an anaphylactic reaction in addition to fatigue and weakness after the infusion. She would like to proceed with treatment as planned. She is expected to start the first dose of this treatment on 01/10/2013. She would come back for followup visit in 2  months with repeat CBC and iron study. She was advised to call me immediately if she has any concerning symptoms in the interval.  All questions were answered. The patient knows to call the clinic with any problems, questions or concerns. We can certainly see the patient much sooner if necessary.

## 2013-01-10 ENCOUNTER — Ambulatory Visit (HOSPITAL_BASED_OUTPATIENT_CLINIC_OR_DEPARTMENT_OTHER): Payer: Medicare Other

## 2013-01-10 VITALS — BP 122/81 | HR 103 | Temp 98.2°F

## 2013-01-10 DIAGNOSIS — D649 Anemia, unspecified: Secondary | ICD-10-CM

## 2013-01-10 MED ORDER — SODIUM CHLORIDE 0.9 % IJ SOLN
3.0000 mL | Freq: Once | INTRAMUSCULAR | Status: DC | PRN
  Filled 2013-01-10: qty 10

## 2013-01-10 MED ORDER — SODIUM CHLORIDE 0.9 % IV SOLN
Freq: Once | INTRAVENOUS | Status: AC
  Administered 2013-01-10: 15:00:00 via INTRAVENOUS

## 2013-01-10 MED ORDER — FERUMOXYTOL INJECTION 510 MG/17 ML
510.0000 mg | Freq: Once | INTRAVENOUS | Status: AC
  Administered 2013-01-10: 510 mg via INTRAVENOUS
  Filled 2013-01-10: qty 17

## 2013-01-10 NOTE — Patient Instructions (Addendum)
Ferumoxytol injection What is this medicine? FERUMOXYTOL is an iron complex. Iron is used to make healthy red blood cells, which carry oxygen and nutrients throughout the body. This medicine is used to treat iron deficiency anemia in people with chronic kidney disease. This medicine may be used for other purposes; ask your health care provider or pharmacist if you have questions. What should I tell my health care provider before I take this medicine? They need to know if you have any of these conditions: -anemia not caused by low iron levels -high levels of iron in the blood -magnetic resonance imaging (MRI) test scheduled -an unusual or allergic reaction to iron, other medicines, foods, dyes, or preservatives -pregnant or trying to get pregnant -breast-feeding How should I use this medicine? This medicine is for infusion into a vein. It is given by a health care professional in a hospital or clinic setting. Talk to your pediatrician regarding the use of this medicine in children. Special care may be needed. Overdosage: If you think you've taken too much of this medicine contact a poison control center or emergency room at once. Overdosage: If you think you have taken too much of this medicine contact a poison control center or emergency room at once. NOTE: This medicine is only for you. Do not share this medicine with others. What if I miss a dose? It is important not to miss your dose. Call your doctor or health care professional if you are unable to keep an appointment. What may interact with this medicine? This medicine may interact with the following medications: -other iron products This list may not describe all possible interactions. Give your health care provider a list of all the medicines, herbs, non-prescription drugs, or dietary supplements you use. Also tell them if you smoke, drink alcohol, or use illegal drugs. Some items may interact with your medicine. What should I watch  for while using this medicine? Visit your doctor or healthcare professional regularly. Tell your doctor or healthcare professional if your symptoms do not start to get better or if they get worse. You may need blood work done while you are taking this medicine. You may need to follow a special diet. Talk to your doctor. Foods that contain iron include: whole grains/cereals, dried fruits, beans, or peas, leafy green vegetables, and organ meats (liver, kidney). What side effects may I notice from receiving this medicine? Side effects that you should report to your doctor or health care professional as soon as possible: -allergic reactions like skin rash, itching or hives, swelling of the face, lips, or tongue -breathing problems -changes in blood pressure -feeling faint or lightheaded, falls -fever or chills -flushing, sweating, or hot feelings -swelling of the ankles or feet Side effects that usually do not require medical attention (Report these to your doctor or health care professional if they continue or are bothersome.): -diarrhea -headache -nausea, vomiting -stomach pain This list may not describe all possible side effects. Call your doctor for medical advice about side effects. You may report side effects to FDA at 1-800-FDA-1088. Where should I keep my medicine? This drug is given in a hospital or clinic and will not be stored at home. NOTE: This sheet is a summary. It may not cover all possible information. If you have questions about this medicine, talk to your doctor, pharmacist, or health care provider.  2012, [employer] Standard. (07/29/2008 9:48:25 PM) 

## 2013-01-10 NOTE — Progress Notes (Signed)
BP 75/47 prior to start of Rituxan. Spoke with Kristen Curcio,NP and ok to proceed. NS fluids at 100/hr started.

## 2013-01-17 ENCOUNTER — Ambulatory Visit (HOSPITAL_BASED_OUTPATIENT_CLINIC_OR_DEPARTMENT_OTHER): Payer: Medicare Other

## 2013-01-17 VITALS — BP 144/63 | HR 99 | Temp 98.6°F

## 2013-01-17 DIAGNOSIS — D649 Anemia, unspecified: Secondary | ICD-10-CM

## 2013-01-17 MED ORDER — FERUMOXYTOL INJECTION 510 MG/17 ML
510.0000 mg | Freq: Once | INTRAVENOUS | Status: AC
  Administered 2013-01-17: 510 mg via INTRAVENOUS
  Filled 2013-01-17: qty 17

## 2013-01-17 NOTE — Patient Instructions (Addendum)
Ferumoxytol injection What is this medicine? FERUMOXYTOL is an iron complex. Iron is used to make healthy red blood cells, which carry oxygen and nutrients throughout the body. This medicine is used to treat iron deficiency anemia in people with chronic kidney disease. This medicine may be used for other purposes; ask your health care provider or pharmacist if you have questions. What should I tell my health care provider before I take this medicine? They need to know if you have any of these conditions: -anemia not caused by low iron levels -high levels of iron in the blood -magnetic resonance imaging (MRI) test scheduled -an unusual or allergic reaction to iron, other medicines, foods, dyes, or preservatives -pregnant or trying to get pregnant -breast-feeding How should I use this medicine? This medicine is for infusion into a vein. It is given by a health care professional in a hospital or clinic setting. Talk to your pediatrician regarding the use of this medicine in children. Special care may be needed. Overdosage: If you think you've taken too much of this medicine contact a poison control center or emergency room at once. Overdosage: If you think you have taken too much of this medicine contact a poison control center or emergency room at once. NOTE: This medicine is only for you. Do not share this medicine with others. What if I miss a dose? It is important not to miss your dose. Call your doctor or health care professional if you are unable to keep an appointment. What may interact with this medicine? This medicine may interact with the following medications: -other iron products This list may not describe all possible interactions. Give your health care provider a list of all the medicines, herbs, non-prescription drugs, or dietary supplements you use. Also tell them if you smoke, drink alcohol, or use illegal drugs. Some items may interact with your medicine. What should I watch  for while using this medicine? Visit your doctor or healthcare professional regularly. Tell your doctor or healthcare professional if your symptoms do not start to get better or if they get worse. You may need blood work done while you are taking this medicine. You may need to follow a special diet. Talk to your doctor. Foods that contain iron include: whole grains/cereals, dried fruits, beans, or peas, leafy green vegetables, and organ meats (liver, kidney). What side effects may I notice from receiving this medicine? Side effects that you should report to your doctor or health care professional as soon as possible: -allergic reactions like skin rash, itching or hives, swelling of the face, lips, or tongue -breathing problems -changes in blood pressure -feeling faint or lightheaded, falls -fever or chills -flushing, sweating, or hot feelings -swelling of the ankles or feet Side effects that usually do not require medical attention (Report these to your doctor or health care professional if they continue or are bothersome.): -diarrhea -headache -nausea, vomiting -stomach pain This list may not describe all possible side effects. Call your doctor for medical advice about side effects. You may report side effects to FDA at 1-800-FDA-1088. Where should I keep my medicine? This drug is given in a hospital or clinic and will not be stored at home. NOTE: This sheet is a summary. It may not cover all possible information. If you have questions about this medicine, talk to your doctor, pharmacist, or health care provider.  2013, [employer]vier/[employer] Standard. (07/29/2008 9:48:25 PM)  

## 2013-02-04 ENCOUNTER — Telehealth: Payer: Self-pay | Admitting: *Deleted

## 2013-02-04 NOTE — Telephone Encounter (Signed)
Lab work from [facility] given to Dr Donnald Garre to review.  SLJ

## 2013-02-05 ENCOUNTER — Other Ambulatory Visit: Payer: Self-pay | Admitting: Cardiology

## 2013-02-06 ENCOUNTER — Telehealth: Payer: Self-pay | Admitting: *Deleted

## 2013-02-06 NOTE — Telephone Encounter (Signed)
Per Dr Donnald Garre, okay to use iron study drawn 02/03/13 for f/u scheduled in April.  Called left msg to inform pt that she will not need labs drawn on 4/21, she will only need to come to f/u.  SLJ

## 2013-02-06 NOTE — Telephone Encounter (Signed)
error 

## 2013-03-10 ENCOUNTER — Encounter: Payer: Self-pay | Admitting: Internal Medicine

## 2013-03-10 ENCOUNTER — Other Ambulatory Visit: Payer: Medicare Other | Admitting: Lab

## 2013-03-10 ENCOUNTER — Ambulatory Visit (HOSPITAL_BASED_OUTPATIENT_CLINIC_OR_DEPARTMENT_OTHER): Payer: Medicare Other | Admitting: Internal Medicine

## 2013-03-10 ENCOUNTER — Telehealth: Payer: Self-pay | Admitting: Internal Medicine

## 2013-03-10 VITALS — BP 113/77 | HR 97 | Temp 98.2°F | Resp 18 | Ht 64.0 in | Wt 181.1 lb

## 2013-03-10 DIAGNOSIS — D638 Anemia in other chronic diseases classified elsewhere: Secondary | ICD-10-CM

## 2013-03-10 DIAGNOSIS — D72829 Elevated white blood cell count, unspecified: Secondary | ICD-10-CM

## 2013-03-10 DIAGNOSIS — D649 Anemia, unspecified: Secondary | ICD-10-CM

## 2013-03-10 NOTE — Patient Instructions (Addendum)
Your anemia is better but he continues to have persistent elevated white blood count. Continue on oral iron tablets for now with repeat CBC in 3 months.

## 2013-03-10 NOTE — Progress Notes (Signed)
[facility] [phone]   Fax:([phone]  OFFICE PROGRESS NOTE  Katherine Bradford, MD [address] [place] [address]  DIAGNOSIS:  #1 reactive leukocytosis.  #2 anemia of chronic disease plus/minus iron deficiency.   PRIOR THERAPY: None   CURRENT THERAPY:Nu-Iron 150 mg by mouth daily.  INTERVAL HISTORY: Katherine Larson 67 y.o. female returns to the clinic today for follow up visit accompanied by her daughter. The patient is feeling fine today with no specific complaints. She denied having any significant weight loss or night sweats. She has no bruises or ecchymosis. She has no bleeding issues. The patient has no significant chest pain, shortness breath, cough or hemoptysis. She had repeat CBC performed recently at her primary care physician's office and she is here for evaluation and discussion of her lab results. It showed normal hemoglobin and hematocrit as well as iron study but persistent elevation of the white blood count to over 19,000.   MEDICAL HISTORY: Past Medical History  Diagnosis Date  . NSVT (nonsustained ventricular tachycardia)   . DM2 (diabetes mellitus, type 2)   . CAD (coronary artery disease)     s/p CABG 1996;  Myoview 3/11: mod partially reversible AS perfusion defect, most likely related to variable breast attenuation although an element of scar with peri-infarct ischemia also possible, EF 54% - felt to be low risk  . HTN (hypertension)   . HLD (hyperlipidemia)   . Asthma   . Neuropathy     ALLERGIES:  is allergic to biaxin and penicillins.  MEDICATIONS:  Current Outpatient Prescriptions  Medication Sig Dispense Refill  . amitriptyline (ELAVIL) 10 MG tablet Take 10 mg by mouth 2 (two) times daily.        Marland Kitchen aspirin 325 MG tablet Take 325 mg by mouth daily.        . budesonide-formoterol (SYMBICORT) 160-4.5 MCG/ACT inhaler Inhale 2 puffs into the lungs 2 (two) times daily.        . fenofibrate (TRICOR) 145 MG  tablet Take 145 mg by mouth daily.        . fexofenadine (ALLEGRA) 180 MG tablet Take 180 mg by mouth daily.        Marland Kitchen losartan-hydrochlorothiazide (HYZAAR) 100-25 MG per tablet Take 1 tablet by mouth daily.        . metFORMIN (GLUCOPHAGE) 1000 MG tablet Take 2,000 mg by mouth 2 (two) times daily with a meal.        . metoCLOPramide (REGLAN) 5 MG tablet Take 5 mg by mouth 2 (two) times daily.        . metoprolol succinate (TOPROL-XL) 50 MG 24 hr tablet TAKE 1 TABLET (50 MG TOTAL) BY MOUTH DAILY.  30 tablet  6  . montelukast (SINGULAIR) 10 MG tablet Take 10 mg by mouth at bedtime.        . niacin (NIASPAN) 1000 MG CR tablet Take 2,000 mg by mouth at bedtime.        Marland Kitchen NITROSTAT 0.4 MG SL tablet PLACE 1 TABLET (0.4 MG TOTAL) UNDER THE TONGUE EVERY 5 (FIVE) MINUTES AS NEEDED.  100 tablet  0  . Omega-3 Fatty Acids (FISH OIL) 1000 MG CAPS Take 1 capsule by mouth 3 (three) times daily.        . pioglitazone (ACTOS) 45 MG tablet Take 45 mg by mouth daily.        . rosuvastatin (CRESTOR) 20 MG tablet Take 20 mg by mouth daily.        Marland Kitchen  traMADol (ULTRAM) 50 MG tablet Take 50 mg by mouth every 8 (eight) hours as needed for pain.       No current facility-administered medications for this visit.    SURGICAL HISTORY:  Past Surgical History  Procedure Laterality Date  . Hernia repair    . Cholecystectomy    . Vesicovaginal fistula closure w/ tah    . Ptca      REVIEW OF SYSTEMS:  A comprehensive review of systems was negative.   PHYSICAL EXAMINATION: General appearance: alert, cooperative and no distress Head: Normocephalic, without obvious abnormality, atraumatic Neck: no adenopathy Lymph nodes: Cervical, supraclavicular, and axillary nodes normal. Resp: clear to auscultation bilaterally Cardio: regular rate and rhythm, S1, S2 normal, no murmur, click, rub or gallop GI: soft, non-tender; bowel sounds normal; no masses,  no organomegaly Extremities: extremities normal, atraumatic, no cyanosis or  edema  ECOG PERFORMANCE STATUS: 0 - Asymptomatic  Blood pressure 113/77, pulse 97, temperature 98.2 F (36.8 C), temperature source Oral, resp. rate 18, height 5\' 4"  (1.626 m), weight 181 lb 1.6 oz (82.146 kg).  LABORATORY DATA: Lab Results  Component Value Date   WBC 13.3* 01/08/2013   HGB 9.9* 01/08/2013   HCT 30.3* 01/08/2013   MCV 78.7* 01/08/2013   PLT 453* 01/08/2013      Chemistry      Component Value Date/Time   NA 136 12/17/2012 0953   NA 138 08/24/2008 1510   K 4.3 12/17/2012 0953   K 3.6 08/24/2008 1510   CL 102 12/17/2012 0953   CL 103 08/24/2008 1510   CO2 21* 12/17/2012 0953   CO2 22 08/24/2008 1510   BUN 19.3 12/17/2012 0953   BUN 15 08/24/2008 1510   CREATININE 0.9 12/17/2012 0953   CREATININE 0.75 08/24/2008 1510      Component Value Date/Time   CALCIUM 10.1 12/17/2012 0953   CALCIUM 9.6 08/24/2008 1510   ALKPHOS 48 12/17/2012 0953   ALKPHOS 35* 08/24/2008 1510   AST 14 12/17/2012 0953   AST 23 08/24/2008 1510   ALT 10 12/17/2012 0953   ALT 18 08/24/2008 1510   BILITOT 0.41 12/17/2012 0953   BILITOT 0.7 08/24/2008 1510       RADIOGRAPHIC STUDIES: No results found.  ASSESSMENT: this is a very pleasant 67 years old white female with persistent leukocytosis most likely reactive in nature as the molecular studies for BCR/ABL showed no detectable copies. Her hemoglobin and hematocrit are within the normal range.  PLAN: I recommended for the patient to continue on observation for now for the reactive leukocytosis. I also advised her to continue taking the oral and tablet on daily basis. I would see her back for follow up visit in 3 months for reevaluation and repeat blood work. If she continues to have persistent leukocytosis, I may consider the patient for a bone marrow biopsy and aspirate to rule out any other abnormalities. The patient agreed to the current plan.  All questions were answered. The patient knows to call the clinic with any problems, questions or concerns. We  can certainly see the patient much sooner if necessary.

## 2013-03-10 NOTE — Telephone Encounter (Signed)
gv pt appt schedule for July.  

## 2013-04-07 ENCOUNTER — Ambulatory Visit
Admission: RE | Admit: 2013-04-07 | Discharge: 2013-04-07 | Disposition: A | Discharge status: Home / Self Care | Payer: Medicare Other | Source: Ambulatory Visit | Attending: Family Medicine | Admitting: Family Medicine

## 2013-04-07 ENCOUNTER — Other Ambulatory Visit: Payer: Self-pay | Admitting: Family Medicine

## 2013-04-07 DIAGNOSIS — M542 Cervicalgia: Secondary | ICD-10-CM

## 2013-04-07 DIAGNOSIS — R079 Chest pain, unspecified: Secondary | ICD-10-CM

## 2013-04-07 MED ORDER — IOHEXOL 350 MG/ML SOLN
125.0000 mL | Freq: Once | INTRAVENOUS | Status: AC | PRN
  Administered 2013-04-07: 125 mL via INTRAVENOUS

## 2013-04-09 ENCOUNTER — Other Ambulatory Visit: Payer: Self-pay | Admitting: Physical Medicine and Rehabilitation

## 2013-04-09 DIAGNOSIS — M545 Low back pain, unspecified: Secondary | ICD-10-CM

## 2013-04-09 DIAGNOSIS — M1990 Unspecified osteoarthritis, unspecified site: Secondary | ICD-10-CM

## 2013-04-17 ENCOUNTER — Ambulatory Visit
Admission: RE | Admit: 2013-04-17 | Discharge: 2013-04-17 | Disposition: A | Discharge status: Home / Self Care | Payer: Medicare Other | Source: Ambulatory Visit | Attending: Physical Medicine and Rehabilitation | Admitting: Physical Medicine and Rehabilitation

## 2013-04-17 DIAGNOSIS — M545 Low back pain, unspecified: Secondary | ICD-10-CM

## 2013-04-17 DIAGNOSIS — M1990 Unspecified osteoarthritis, unspecified site: Secondary | ICD-10-CM

## 2013-06-01 ENCOUNTER — Other Ambulatory Visit: Payer: Self-pay | Admitting: Neurological Surgery

## 2013-06-01 DIAGNOSIS — M549 Dorsalgia, unspecified: Secondary | ICD-10-CM

## 2013-06-02 ENCOUNTER — Other Ambulatory Visit: Payer: Self-pay | Admitting: Physician Assistant

## 2013-06-04 ENCOUNTER — Inpatient Hospital Stay: Admission: RE | Admit: 2013-06-04 | Payer: Medicare Other | Source: Ambulatory Visit

## 2013-06-04 ENCOUNTER — Ambulatory Visit
Admission: RE | Admit: 2013-06-04 | Discharge: 2013-06-04 | Disposition: A | Discharge status: Home / Self Care | Payer: Medicare Other | Source: Ambulatory Visit | Attending: Neurological Surgery | Admitting: Neurological Surgery

## 2013-06-04 VITALS — BP 134/67 | HR 104

## 2013-06-04 DIAGNOSIS — M549 Dorsalgia, unspecified: Secondary | ICD-10-CM

## 2013-06-04 MED ORDER — IOHEXOL 180 MG/ML  SOLN
15.0000 mL | Freq: Once | INTRAMUSCULAR | Status: AC | PRN
  Administered 2013-06-04: 15 mL via INTRATHECAL

## 2013-06-04 MED ORDER — DIAZEPAM 5 MG PO TABS
5.0000 mg | ORAL_TABLET | Freq: Once | ORAL | Status: AC
  Administered 2013-06-04: 5 mg via ORAL

## 2013-06-04 NOTE — Progress Notes (Signed)
Patient states she has been off Amitriptyline for the past two days.

## 2013-06-09 ENCOUNTER — Ambulatory Visit (HOSPITAL_BASED_OUTPATIENT_CLINIC_OR_DEPARTMENT_OTHER): Payer: Medicare Other | Admitting: Internal Medicine

## 2013-06-09 ENCOUNTER — Other Ambulatory Visit (HOSPITAL_BASED_OUTPATIENT_CLINIC_OR_DEPARTMENT_OTHER): Payer: Medicare Other | Admitting: Lab

## 2013-06-09 ENCOUNTER — Telehealth: Payer: Self-pay | Admitting: Internal Medicine

## 2013-06-09 ENCOUNTER — Encounter: Payer: Self-pay | Admitting: Internal Medicine

## 2013-06-09 VITALS — BP 138/69 | HR 110 | Temp 98.5°F | Resp 19 | Ht 64.0 in | Wt 172.9 lb

## 2013-06-09 DIAGNOSIS — D72829 Elevated white blood cell count, unspecified: Secondary | ICD-10-CM

## 2013-06-09 DIAGNOSIS — D649 Anemia, unspecified: Secondary | ICD-10-CM

## 2013-06-09 LAB — CBC WITH DIFFERENTIAL/PLATELET
BASO%: 0.4 % (ref 0.0–2.0)
Basophils Absolute: 0 10*3/uL (ref 0.0–0.1)
EOS%: 1.6 % (ref 0.0–7.0)
Eosinophils Absolute: 0.2 10*3/uL (ref 0.0–0.5)
HCT: 34.8 % (ref 34.8–46.6)
HGB: 11.8 g/dL (ref 11.6–15.9)
LYMPH%: 17.3 % (ref 14.0–49.7)
MCH: 31.8 pg (ref 25.1–34.0)
MCHC: 33.9 g/dL (ref 31.5–36.0)
MCV: 93.8 fL (ref 79.5–101.0)
MONO#: 0.7 10*3/uL (ref 0.1–0.9)
MONO%: 5.3 % (ref 0.0–14.0)
NEUT#: 9.9 10*3/uL — ABNORMAL HIGH (ref 1.5–6.5)
NEUT%: 75.4 % (ref 38.4–76.8)
Platelets: 205 10*3/uL (ref 145–400)
RBC: 3.71 10*6/uL (ref 3.70–5.45)
RDW: 14.4 % (ref 11.2–14.5)
WBC: 13.2 10*3/uL — ABNORMAL HIGH (ref 3.9–10.3)
lymph#: 2.3 10*3/uL (ref 0.9–3.3)

## 2013-06-09 LAB — COMPREHENSIVE METABOLIC PANEL (CC13)
ALT: 20 U/L (ref 0–55)
AST: 16 U/L (ref 5–34)
Albumin: 3.2 g/dL — ABNORMAL LOW (ref 3.5–5.0)
Alkaline Phosphatase: 42 U/L (ref 40–150)
BUN: 11.5 mg/dL (ref 7.0–26.0)
CO2: 21 mEq/L — ABNORMAL LOW (ref 22–29)
Calcium: 9.1 mg/dL (ref 8.4–10.4)
Chloride: 105 mEq/L (ref 98–109)
Creatinine: 0.7 mg/dL (ref 0.6–1.1)
Glucose: 156 mg/dl — ABNORMAL HIGH (ref 70–140)
Potassium: 3.3 mEq/L — ABNORMAL LOW (ref 3.5–5.1)
Sodium: 140 mEq/L (ref 136–145)
Total Bilirubin: 0.44 mg/dL (ref 0.20–1.20)
Total Protein: 6.7 g/dL (ref 6.4–8.3)

## 2013-06-09 LAB — LACTATE DEHYDROGENASE (CC13): LDH: 219 U/L (ref 125–245)

## 2013-06-09 NOTE — Progress Notes (Signed)
[facility] Telephone:([phone]   Fax:(336) [phone]  OFFICE PROGRESS NOTE  [name], MD [address] [place] [address]  DIAGNOSIS:  #1 reactive leukocytosis.  #2 anemia of chronic disease plus/minus iron deficiency.   PRIOR THERAPY: None   CURRENT THERAPY:Nu-Iron 150 mg by mouth daily.   INTERVAL HISTORY: Katherine Larson 67 y.o. female returns to the clinic today for followup visit accompanied her husband. The patient is feeling fine today with no specific complaints except for mild fatigue. She denied having any bleeding issues. She denied having any weight loss or night sweats. She has no palpable lymphadenopathy. The patient has repeat CBC and iron study performed earlier today and she is here for evaluation and discussion of her lab results.  MEDICAL HISTORY: Past Medical History  Diagnosis Date  . NSVT (nonsustained ventricular tachycardia)   . DM2 (diabetes mellitus, type 2)   . CAD (coronary artery disease)     s/p CABG 1996;  Myoview 3/11: mod partially reversible AS perfusion defect, most likely related to variable breast attenuation although an element of scar with peri-infarct ischemia also possible, EF 54% - felt to be low risk  . HTN (hypertension)   . HLD (hyperlipidemia)   . Asthma   . Neuropathy     ALLERGIES:  is allergic to biaxin and penicillins.  MEDICATIONS:  Current Outpatient Prescriptions  Medication Sig Dispense Refill  . amitriptyline (ELAVIL) 10 MG tablet Take 10 mg by mouth 2 (two) times daily.        Marland Kitchen aspirin 325 MG tablet Take 325 mg by mouth daily.        . budesonide-formoterol (SYMBICORT) 160-4.5 MCG/ACT inhaler Inhale 2 puffs into the lungs 2 (two) times daily.        . fenofibrate (TRICOR) 145 MG tablet Take 145 mg by mouth daily.        . fexofenadine (ALLEGRA) 180 MG tablet Take 180 mg by mouth daily.        Marland Kitchen losartan-hydrochlorothiazide (HYZAAR) 100-25 MG per tablet Take 1 tablet by mouth  daily.        . metFORMIN (GLUCOPHAGE) 1000 MG tablet Take 2,000 mg by mouth 2 (two) times daily with a meal.        . metoCLOPramide (REGLAN) 5 MG tablet Take 5 mg by mouth 2 (two) times daily.        . metoprolol succinate (TOPROL-XL) 50 MG 24 hr tablet TAKE 1 TABLET EVERY DAY  30 tablet  5  . montelukast (SINGULAIR) 10 MG tablet Take 10 mg by mouth at bedtime.        . niacin (NIASPAN) 1000 MG CR tablet Take 2,000 mg by mouth at bedtime.        Marland Kitchen NITROSTAT 0.4 MG SL tablet PLACE 1 TABLET (0.4 MG TOTAL) UNDER THE TONGUE EVERY 5 (FIVE) MINUTES AS NEEDED.  100 tablet  0  . Omega-3 Fatty Acids (FISH OIL) 1000 MG CAPS Take 1 capsule by mouth 3 (three) times daily.        . pioglitazone (ACTOS) 45 MG tablet Take 45 mg by mouth daily.        . rosuvastatin (CRESTOR) 20 MG tablet Take 20 mg by mouth daily.        . traMADol (ULTRAM) 50 MG tablet Take 50 mg by mouth every 8 (eight) hours as needed for pain.       No current facility-administered medications for this visit.  SURGICAL HISTORY:  Past Surgical History  Procedure Laterality Date  . Hernia repair    . Cholecystectomy    . Vesicovaginal fistula closure w/ tah    . Ptca      REVIEW OF SYSTEMS:  A comprehensive review of systems was negative except for: Constitutional: positive for fatigue   PHYSICAL EXAMINATION: General appearance: alert, cooperative, fatigued and no distress Head: Normocephalic, without obvious abnormality, atraumatic Neck: no adenopathy Lymph nodes: Cervical, supraclavicular, and axillary nodes normal. Resp: clear to auscultation bilaterally Cardio: regular rate and rhythm, S1, S2 normal, no murmur, click, rub or gallop GI: soft, non-tender; bowel sounds normal; no masses,  no organomegaly Extremities: extremities normal, atraumatic, no cyanosis or edema  ECOG PERFORMANCE STATUS: 1 - Symptomatic but completely ambulatory  Blood pressure 138/69, pulse 110, temperature 98.5 F (36.9 C), temperature source  Oral, resp. rate 19, height 5\' 4"  (1.626 m), weight 172 lb 14.4 oz (78.427 kg).  LABORATORY DATA: Lab Results  Component Value Date   WBC 13.3* 01/08/2013   HGB 9.9* 01/08/2013   HCT 30.3* 01/08/2013   MCV 78.7* 01/08/2013   PLT 453* 01/08/2013      Chemistry      Component Value Date/Time   NA 136 12/17/2012 0953   NA 138 08/24/2008 1510   K 4.3 12/17/2012 0953   K 3.6 08/24/2008 1510   CL 102 12/17/2012 0953   CL 103 08/24/2008 1510   CO2 21* 12/17/2012 0953   CO2 22 08/24/2008 1510   BUN 19.3 12/17/2012 0953   BUN 15 08/24/2008 1510   CREATININE 0.9 12/17/2012 0953   CREATININE 0.75 08/24/2008 1510      Component Value Date/Time   CALCIUM 10.1 12/17/2012 0953   CALCIUM 9.6 08/24/2008 1510   ALKPHOS 48 12/17/2012 0953   ALKPHOS 35* 08/24/2008 1510   AST 14 12/17/2012 0953   AST 23 08/24/2008 1510   ALT 10 12/17/2012 0953   ALT 18 08/24/2008 1510   BILITOT 0.41 12/17/2012 0953   BILITOT 0.7 08/24/2008 1510       RADIOGRAPHIC STUDIES: Ct Lumbar Spine W Contrast  06/04/2013   *RADIOLOGY REPORT*  MYELOGRAM INJECTION  Technique:  Informed consent was obtained from the patient prior to the procedure, including potential complications of headache, allergy, infection and pain. Specific instructions were given regarding 24 hour bedrest post procedure to prevent post-LP headache.  A timeout procedure was performed.  With the patient prone, the lower back was prepped with Betadine.  1% Lidocaine was used for local anesthesia.  Lumbar puncture was performed by the radiologist at the L3-L4 level using a 22 gauge needle with return of clear CSF.  15 cc of Omnipaque 180 was injected into the subarachnoid space .  IMPRESSION: Successful injection of  intrathecal contrast for myelography.  MYELOGRAM LUMBAR  Clinical Data:  Low back pain.  Left greater than right leg pain.  Comparison: MRI lumbar spine 04/17/2013.  Findings: Good opacification lumbar subarachnoid space.  Moderate stenosis L3-L4 with bilateral L4  nerve root effacement slightly worse on the left. 1 mm anterolisthesis with the patient recumbent for  myelography.  This is unchanged with the patient upright in neutral and extension.  This increases to 2 mm in flexion.  This stenosis at L3-L4 is worst in extension due to a combination of ligamentum flavum hypertrophy and central protrusion.  Mild bulge L2-L3.  Moderate vascular calcification.  Gastrointestinal surgical clips.  Fluoroscopy Time:  1 minute and 16 seconds  IMPRESSION: As  above.  CT MYELOGRAPHY LUMBAR SPINE  Technique: Multidetector CT imaging of the lumbar spine was performed following myelography.  Multiplanar CT image reconstructions were also generated.  Findings:  No prevertebral or paraspinous masses.  Nonaneurysmal atherosclerotic calcification of the aorta.  Normal conus.  L1-2: Mild peripheral annular calcification is not associated with disc protrusion or stenosis.  L2-3: Mild peripheral annular calcification is not associated disc protrusion or stenosis.  L3-4: 1 mm facet mediated anterolisthesis. Mild central canal stenosis.  Advanced facet arthropathy and left greater than right ligamentum flavum hypertrophy.  Focal area of ligamentum flavum hypertrophy in the left paramedian dorsal epidural space, 8 mm diameter, represents a synovial cyst, extends also into the left lateral recess and in combination with a broad-based central disc protrusion showing mild peripheral annular calcification, results in left greater than right L4 nerve root impingement.  There is left-sided neural foraminal narrowing which likely displaces the left L3 nerve root.  L4-5: Mild bulge.  Mild facet arthropathy.  No impingement.  L5-S1: Severe right-sided facet arthropathy.  Mild bulge.  No impingement.  Good general agreement when compared with prior MR.  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  Mild dynamic instability with the patient standing in flexion,  up to 2 mm slip.  Stenosis at L3-L4 is worst with the patient standing in extension.  Left greater than right L4 and left L3 nerve root impingement are likely.  See discussion above.   Original Report Authenticated By: Davonna Belling, M.D.   Dg Myelogram Lumbar  06/04/2013   *RADIOLOGY REPORT*  MYELOGRAM INJECTION  Technique:  Informed consent was obtained from the patient prior to the procedure, including potential complications of headache, allergy, infection and pain. Specific instructions were given regarding 24 hour bedrest post procedure to prevent post-LP headache.  A timeout procedure was performed.  With the patient prone, the lower back was prepped with Betadine.  1% Lidocaine was used for local anesthesia.  Lumbar puncture was performed by the radiologist at the L3-L4 level using a 22 gauge needle with return of clear CSF.  15 cc of Omnipaque 180 was injected into the subarachnoid space .  IMPRESSION: Successful injection of  intrathecal contrast for myelography.  MYELOGRAM LUMBAR  Clinical Data:  Low back pain.  Left greater than right leg pain.  Comparison: MRI lumbar spine 04/17/2013.  Findings: Good opacification lumbar subarachnoid space.  Moderate stenosis L3-L4 with bilateral L4 nerve root effacement slightly worse on the left. 1 mm anterolisthesis with the patient recumbent for  myelography.  This is unchanged with the patient upright in neutral and extension.  This increases to 2 mm in flexion.  This stenosis at L3-L4 is worst in extension due to a combination of ligamentum flavum hypertrophy and central protrusion.  Mild bulge L2-L3.  Moderate vascular calcification.  Gastrointestinal surgical clips.  Fluoroscopy Time:  1 minute and 16 seconds  IMPRESSION: As above.  CT MYELOGRAPHY LUMBAR SPINE  Technique: Multidetector CT imaging of the lumbar spine was performed following myelography.  Multiplanar CT image  reconstructions were also generated.  Findings:  No prevertebral or paraspinous masses.  Nonaneurysmal atherosclerotic calcification of the aorta.  Normal conus.  L1-2: Mild peripheral annular calcification is not associated with disc protrusion or stenosis.  L2-3: Mild peripheral annular calcification is not associated disc protrusion or stenosis.  L3-4: 1 mm facet mediated anterolisthesis. Mild central  canal stenosis.  Advanced facet arthropathy and left greater than right ligamentum flavum hypertrophy.  Focal area of ligamentum flavum hypertrophy in the left paramedian dorsal epidural space, 8 mm diameter, represents a synovial cyst, extends also into the left lateral recess and in combination with a broad-based central disc protrusion showing mild peripheral annular calcification, results in left greater than right L4 nerve root impingement.  There is left-sided neural foraminal narrowing which likely displaces the left L3 nerve root.  L4-5: Mild bulge.  Mild facet arthropathy.  No impingement.  L5-S1: Severe right-sided facet arthropathy.  Mild bulge.  No impingement.  Good general agreement when compared with prior MR.  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  Mild dynamic instability with the patient standing in flexion,  up to 2 mm slip.  Stenosis at L3-L4 is worst with the patient standing in extension.  Left greater than right L4 and left L3 nerve root impingement are likely.  See discussion above.   Original Report Authenticated By: Davonna Belling, M.D.    ASSESSMENT AND PLAN: This is a very pleasant 67 years old white female with anemia of chronic disease plus/minus deficiency currently on Nu-iron and tolerating it fairly well. Her hemoglobin and hematocrit are within the acceptable range today. I recommended for the patient to continue her current treatment with  Nu-Iron. I would see her back for followup visit in 6 months with repeat CBC, comprehensive metabolic panel, LDH as well as iron study and ferritin. The patient was advised to call immediately she has any concerning symptoms in the interval.  All questions were answered. The patient knows to call the clinic with any problems, questions or concerns. We can certainly see the patient much sooner if necessary.

## 2013-06-09 NOTE — Patient Instructions (Signed)
Continue oral iron tablet.  Followup visit in 6 months with repeat CBC and iron study.

## 2013-06-09 NOTE — Telephone Encounter (Signed)
gv and printed appt sched and avs for pt  °

## 2013-07-16 ENCOUNTER — Encounter: Payer: Self-pay | Admitting: Cardiovascular Disease

## 2013-07-16 ENCOUNTER — Ambulatory Visit (INDEPENDENT_AMBULATORY_CARE_PROVIDER_SITE_OTHER): Payer: Medicare Other | Admitting: Cardiovascular Disease

## 2013-07-16 VITALS — BP 152/84 | HR 144 | Ht 64.0 in | Wt 167.0 lb

## 2013-07-16 DIAGNOSIS — E119 Type 2 diabetes mellitus without complications: Secondary | ICD-10-CM

## 2013-07-16 DIAGNOSIS — I2510 Atherosclerotic heart disease of native coronary artery without angina pectoris: Secondary | ICD-10-CM

## 2013-07-16 DIAGNOSIS — R0602 Shortness of breath: Secondary | ICD-10-CM

## 2013-07-16 DIAGNOSIS — Z951 Presence of aortocoronary bypass graft: Secondary | ICD-10-CM

## 2013-07-16 DIAGNOSIS — Z01810 Encounter for preprocedural cardiovascular examination: Secondary | ICD-10-CM

## 2013-07-16 DIAGNOSIS — I4892 Unspecified atrial flutter: Secondary | ICD-10-CM

## 2013-07-16 MED ORDER — METOPROLOL SUCCINATE ER 100 MG PO TB24: ORAL_TABLET | ORAL | Status: DC

## 2013-07-16 MED ORDER — RIVAROXABAN 20 MG PO TABS: 20.0000 mg | ORAL_TABLET | Freq: Every day | ORAL | Status: DC

## 2013-07-16 NOTE — Addendum Note (Signed)
Addended by: Thompson Grayer on: 07/16/2013 12:32 PM   Modules accepted: Orders

## 2013-07-16 NOTE — Assessment & Plan Note (Signed)
No history of COPD  Needs PFT;s with bronchodilators  Echo to r/o CHF and assess EF.  She has f/u with Aram Beecham white for infection

## 2013-07-16 NOTE — Progress Notes (Signed)
Patient ID: Katherine Larson, female   DOB: February 24, 1946, 67 y.o.   MRN: [id number] Katherine Larson is a 67 y/o patient of Dr.Wall we are seeing ongoing treatment and assessment of CAD with CABG in 1996, Hypertension, HLP, and diabetes. She is to be scheduled for Left knee meniscus repair per Dr. Fara Chute at [facility]. She was seen by Tereso Newcomer, PA on Aug 31, 2011 after pre-operative evaluation by Dr. Fara Chute because she was found to be very tachycardic. She had not been taking Toprol as directed as she did not have a Rx for this  Subsequently did well with TKR  Echo was normal   Now with back problems. Needs ? fusiion with Dr Yetta Barre with [facility].  Has had recent UTI.  Some dyspnea and wheezing.  In office she was in atrial tachycardia/flutter at rate of 144  She was unaware of this  ROS: Denies fever, malais, weight loss, blurry vision, decreased visual acuity, cough, sputum, SOB, hemoptysis, pleuritic pain, palpitaitons, heartburn, abdominal pain, melena, lower extremity edema, claudication, or rash.  All other systems reviewed and negative  General: Affect appropriate Healthy:  appears stated age HEENT: normal Neck supple with no adenopathy JVP normal no bruits no thyromegaly Lungs rhonchi and  wheezing and good diaphragmatic motion Heart:  S1/S2 no murmur, no rub, gallop or click PMI normal Abdomen: benighn, BS positve, no tenderness, no AAA no bruit.  No HSM or HJR Distal pulses intact with no bruits No edema Neuro non-focal Skin warm and dry No muscular weakness   Current Outpatient Prescriptions  Medication Sig Dispense Refill  . amitriptyline (ELAVIL) 10 MG tablet Take 10 mg by mouth 2 (two) times daily.        . fenofibrate (TRICOR) 145 MG tablet Take 145 mg by mouth daily.        . fexofenadine (ALLEGRA) 180 MG tablet Take 180 mg by mouth daily.        Marland Kitchen losartan-hydrochlorothiazide (HYZAAR) 100-25 MG per tablet Take 1 tablet by mouth daily.        . metFORMIN  (GLUCOPHAGE) 1000 MG tablet Take 2,000 mg by mouth 2 (two) times daily with a meal.        . metoCLOPramide (REGLAN) 5 MG tablet Take 5 mg by mouth 2 (two) times daily.        . metoprolol succinate (TOPROL-XL) 50 MG 24 hr tablet TAKE 1 TABLET EVERY DAY  30 tablet  5  . montelukast (SINGULAIR) 10 MG tablet Take 10 mg by mouth at bedtime.        . niacin (NIASPAN) 1000 MG CR tablet Take 2,000 mg by mouth at bedtime.        . nitrofurantoin (MACRODANTIN) 100 MG capsule Take 100 mg by mouth every 12 (twelve) hours.      Marland Kitchen NITROSTAT 0.4 MG SL tablet PLACE 1 TABLET (0.4 MG TOTAL) UNDER THE TONGUE EVERY 5 (FIVE) MINUTES AS NEEDED.  100 tablet  0  . Omega-3 Fatty Acids (FISH OIL) 1000 MG CAPS Take 1 capsule by mouth 3 (three) times daily.        . pioglitazone (ACTOS) 45 MG tablet Take 45 mg by mouth daily.        . rosuvastatin (CRESTOR) 20 MG tablet Take 20 mg by mouth daily.         No current facility-administered medications for this visit.    Allergies  Biaxin and Penicillins  Electrocardiogram:  Atrial tach/flutter rate 141  Nonspecific STT wave  changes  Assessment and Plan

## 2013-07-16 NOTE — Addendum Note (Signed)
Addended by: Thompson Grayer on: 07/16/2013 12:38 PM   Modules accepted: Orders

## 2013-07-16 NOTE — Assessment & Plan Note (Signed)
Coronary diseae is stable but surgery will have to be put off until UTI, wheezing and flutter taken care of.  Need for anticoagulation will postpone surgery the longest.  No need for myovue preop.

## 2013-07-16 NOTE — Assessment & Plan Note (Signed)
New diagnosis  No palpitations per patient  Will increase toprol to 100mg   Check echo for EF and atrial sizes Start xarelto 20mg   No contraindication to anticoagulaiton.  BMET today  F/U with Dr Ladona Ridgel in 3 weeks to see if she needs [facility] or ablation.

## 2013-07-16 NOTE — Patient Instructions (Addendum)
Your physician recommends that you schedule a follow-up appointment in: With Dr Ladona Ridgel  Your physician has requested that you have an echocardiogram. Echocardiography is a painless test that uses sound waves to create images of your heart. It provides your doctor with information about the size and shape of your heart and how well your heart's chambers and valves are working. This procedure takes approximately one hour. There are no restrictions for this procedure.  Your physician recommends that you return for lab work today. BMET  Your physician has recommended you make the following change in your medication:  1.Start Toprol 100mg  Daily 2. Start xarelto 20 mg daily.

## 2013-07-17 LAB — BASIC METABOLIC PANEL
BUN: 9 mg/dL (ref 6–23)
CO2: 23 mEq/L (ref 19–32)
Calcium: 9.1 mg/dL (ref 8.4–10.5)
Chloride: 99 mEq/L (ref 96–112)
Creat: 0.44 mg/dL — ABNORMAL LOW (ref 0.50–1.10)
Glucose, Bld: 108 mg/dL — ABNORMAL HIGH (ref 70–99)
Potassium: 3.8 mEq/L (ref 3.5–5.3)
Sodium: 136 mEq/L (ref 135–145)

## 2013-07-22 ENCOUNTER — Ambulatory Visit (INDEPENDENT_AMBULATORY_CARE_PROVIDER_SITE_OTHER): Payer: Medicare Other | Admitting: Internal Medicine

## 2013-07-22 ENCOUNTER — Encounter: Payer: Self-pay | Admitting: Internal Medicine

## 2013-07-22 VITALS — BP 134/76 | HR 101 | Ht 64.0 in | Wt 165.2 lb

## 2013-07-22 DIAGNOSIS — I4892 Unspecified atrial flutter: Secondary | ICD-10-CM

## 2013-07-22 DIAGNOSIS — I2510 Atherosclerotic heart disease of native coronary artery without angina pectoris: Secondary | ICD-10-CM

## 2013-07-22 DIAGNOSIS — Z01810 Encounter for preprocedural cardiovascular examination: Secondary | ICD-10-CM

## 2013-07-22 NOTE — Progress Notes (Signed)
HPI Katherine Larson is referred today for evaluation of atrial flutter. The patient is a 67 year old woman with known coronary artery disease, status post bypass surgery in 1996. She has diabetes and hypertension. The patient  Was found to have atrial flutter/tachycardia with a rapid ventricular response several days ago and returns today for additional evaluation. She was asymptomatic. She denies chest pain or shortness of breath. She has never had syncope. She is pending back surgery. Prior to her bypass, she had substernal chest discomfort with exertion. During her episode of tachycardia, she had none of this. She does not experience palpitations whatsoever. After being seen by Dr. Eden Emms, her dose of metoprolol was increased to 100 mg daily, and she was begun on anticoagulation. Allergies  Allergen Reactions  . Biaxin [Clarithromycin] Rash and Other (See Comments)    Blisters in mouth   . Penicillins Rash and Other (See Comments)    Blisters in mouth      Current Outpatient Prescriptions  Medication Sig Dispense Refill  . amitriptyline (ELAVIL) 10 MG tablet Take 10 mg by mouth 2 (two) times daily.        Marland Kitchen atorvastatin (LIPITOR) 40 MG tablet Take 40 mg by mouth daily.      . fenofibrate (TRICOR) 145 MG tablet Take 145 mg by mouth daily.        . fexofenadine (ALLEGRA) 180 MG tablet Take 180 mg by mouth daily.        Marland Kitchen losartan-hydrochlorothiazide (HYZAAR) 100-25 MG per tablet Take 1 tablet by mouth daily.        . metFORMIN (GLUCOPHAGE) 1000 MG tablet Take 2,000 mg by mouth 2 (two) times daily with a meal.        . metoCLOPramide (REGLAN) 5 MG tablet Take 5 mg by mouth 2 (two) times daily.        . metoprolol succinate (TOPROL-XL) 100 MG 24 hr tablet TAKE 1 TABLET EVERY DAY  30 tablet  5  . niacin (NIASPAN) 1000 MG CR tablet Take 2,000 mg by mouth at bedtime.        . nitrofurantoin (MACRODANTIN) 100 MG capsule Take 100 mg by mouth every 12 (twelve) hours.      Marland Kitchen NITROSTAT 0.4 MG SL tablet  PLACE 1 TABLET (0.4 MG TOTAL) UNDER THE TONGUE EVERY 5 (FIVE) MINUTES AS NEEDED.  100 tablet  0  . Omega-3 Fatty Acids (FISH OIL) 1000 MG CAPS Take 1 capsule by mouth 3 (three) times daily.        . pioglitazone (ACTOS) 45 MG tablet Take 45 mg by mouth daily.        . Rivaroxaban (XARELTO) 20 MG TABS tablet Take 1 tablet (20 mg total) by mouth daily.  30 tablet  3   No current facility-administered medications for this visit.     Past Medical History  Diagnosis Date  . NSVT (nonsustained ventricular tachycardia)   . DM2 (diabetes mellitus, type 2)   . CAD (coronary artery disease)     s/p CABG 1996;  Myoview 3/11: mod partially reversible AS perfusion defect, most likely related to variable breast attenuation although an element of scar with peri-infarct ischemia also possible, EF 54% - felt to be low risk  . HTN (hypertension)   . HLD (hyperlipidemia)   . Asthma   . Neuropathy     ROS:   All systems reviewed and negative except as noted in the HPI.   Past Surgical History  Procedure Laterality Date  . Hernia  repair    . Cholecystectomy    . Vesicovaginal fistula closure w/ tah    . Ptca       Family History  Problem Relation Age of Onset  . Heart attack Father   . Heart attack Mother      History   Social History  . Marital Status: Married    Spouse Name: N/A    Number of Children: N/A  . Years of Education: N/A   Occupational History  . Not on file.   Social History Main Topics  . Smoking status: Former Smoker    Quit date: 11/20/1994  . Smokeless tobacco: Not on file  . Alcohol Use: No  . Drug Use: No  . Sexual Activity: Not on file   Other Topics Concern  . Not on file   Social History Narrative  . No narrative on file     BP 134/76  Pulse 101  Ht 5\' 4"  (1.626 m)  Wt 165 lb 3.2 oz (74.934 kg)  BMI 28.34 kg/m2  Physical Exam:  Well appearing middle-age woman, NAD HEENT: Unremarkable Neck:  7 cm JVD, no thyromegally Back:  No CVA  tenderness Lungs:  Clear with no wheezes, rales, or rhonchi. HEART:  Regular rate rhythm, no murmurs, no rubs, no clicks Abd:  soft, positive bowel sounds, no organomegally, no rebound, no guarding Ext:  2 plus pulses, no edema, no cyanosis, no clubbing Skin:  No rashes no nodules Neuro:  CN II through XII intact, motor grossly intact  EKG - normal sinus rhythm at 100 beats per minute, nonspecific T wave abnormality.   Assess/Plan:

## 2013-07-22 NOTE — Assessment & Plan Note (Signed)
She denies anginal symptoms. No change in medical therapy. 

## 2013-07-22 NOTE — Patient Instructions (Addendum)
Your physician recommends that you schedule a follow-up appointment in: 3-4 months is [place] with Dr Ladona Ridgel  Low risk from a cardiovascular standpoint for surgery.  Okay to proceed.  May hold Xarelto 3 days prior to procedure and resume the day after.

## 2013-07-22 NOTE — Assessment & Plan Note (Signed)
The patient is pending spine surgery. She is low risk for major cardiovascular complications, and I do not think additional testing as ordered.

## 2013-07-22 NOTE — Assessment & Plan Note (Signed)
Her ECG is either due to atrial tachycardia or atrial flutter. I've discussed the treatment options with the patient. She has reverted back to sinus rhythm on beta blocker therapy, and then placed on anticoagulation. Because she has no symptoms, I would not recommend any additional treatment at this time. I have recommended that the patient check her heart rate daily and to notify us if her heart rate is much over 100 beats per minute at rest.

## 2013-07-23 ENCOUNTER — Ambulatory Visit (HOSPITAL_COMMUNITY)
Admission: RE | Admit: 2013-07-23 | Discharge: 2013-07-23 | Disposition: A | Discharge status: Home / Self Care | Payer: Medicare Other | Source: Ambulatory Visit | Attending: Cardiovascular Disease | Admitting: Cardiovascular Disease

## 2013-07-23 DIAGNOSIS — Z951 Presence of aortocoronary bypass graft: Secondary | ICD-10-CM

## 2013-07-23 DIAGNOSIS — E119 Type 2 diabetes mellitus without complications: Secondary | ICD-10-CM

## 2013-07-23 DIAGNOSIS — R0609 Other forms of dyspnea: Secondary | ICD-10-CM | POA: Insufficient documentation

## 2013-07-23 DIAGNOSIS — R0602 Shortness of breath: Secondary | ICD-10-CM

## 2013-07-23 DIAGNOSIS — I4892 Unspecified atrial flutter: Secondary | ICD-10-CM

## 2013-07-23 DIAGNOSIS — Z01810 Encounter for preprocedural cardiovascular examination: Secondary | ICD-10-CM

## 2013-07-23 DIAGNOSIS — I10 Essential (primary) hypertension: Secondary | ICD-10-CM | POA: Insufficient documentation

## 2013-07-23 DIAGNOSIS — I2510 Atherosclerotic heart disease of native coronary artery without angina pectoris: Secondary | ICD-10-CM

## 2013-07-23 DIAGNOSIS — R0989 Other specified symptoms and signs involving the circulatory and respiratory systems: Secondary | ICD-10-CM | POA: Insufficient documentation

## 2013-07-23 DIAGNOSIS — I359 Nonrheumatic aortic valve disorder, unspecified: Secondary | ICD-10-CM

## 2013-07-23 MED ORDER — ALBUTEROL SULFATE (5 MG/ML) 0.5% IN NEBU
2.5000 mg | INHALATION_SOLUTION | Freq: Once | RESPIRATORY_TRACT | Status: AC
  Administered 2013-07-23: 2.5 mg via RESPIRATORY_TRACT

## 2013-07-23 NOTE — Progress Notes (Signed)
*  PRELIMINARY RESULTS* Echocardiogram 2D Echocardiogram has been performed.  Katherine Larson 07/23/2013, 1:48 PM

## 2013-07-31 ENCOUNTER — Telehealth: Payer: Self-pay | Admitting: Cardiovascular Disease

## 2013-07-31 NOTE — Telephone Encounter (Signed)
New Problem  Pt state Neurosurgeon Dr. Yetta Barre @ [facility] has not received results for clearance.   Fax #[phone]

## 2013-07-31 NOTE — Telephone Encounter (Signed)
Lm w/ pt's mobile phone that Dr. Eden Emms not in office today.  Will forward note Scherrie Bateman, LPN for follow up 9/12

## 2013-08-01 ENCOUNTER — Other Ambulatory Visit: Payer: Self-pay | Admitting: Neurological Surgery

## 2013-08-01 NOTE — Telephone Encounter (Signed)
DR Lubertha Basque LAST  OFFICE NOTE  FAXED TO  LISTED FAX NUMBER PT  MAY PROCEED  WITH PROCEDURE PER DR TAYLOR./CY

## 2013-08-04 NOTE — Procedures (Signed)
[name], Katherine Larson               ACCOUNT NO.:  [id number]  MEDICAL RECORD NO.:  [id number]  LOCATION:                            FACILITY:  [place]  PHYSICIAN:  Sharell Hilmer L. Juanetta Gosling, M.D.DATE OF BIRTH:  November 08, 1946  DATE OF PROCEDURE: DATE OF DISCHARGE:                           PULMONARY FUNCTION TEST   REASON FOR PULMONARY FUNCTION TESTING:  Unspecified.  1. Spirometry shows a mild-to-moderate ventilatory defect without     definite airflow obstruction. 2. Lung volumes are normal. 3. DLCO is mildly to moderately reduced. 4. Airway resistance is normal. 5. There is no significant bronchodilator improvement.     [name] L. Juanetta Gosling, M.D.     ELH/MEDQ  D:  07/31/2013  T:  08/01/2013  Job:  [id number]  cc:   Noralyn Pick. Eden Emms, MD, [facility]

## 2013-08-06 LAB — PULMONARY FUNCTION TEST

## 2013-08-07 ENCOUNTER — Encounter (HOSPITAL_COMMUNITY): Payer: Self-pay

## 2013-08-11 ENCOUNTER — Encounter (HOSPITAL_COMMUNITY)
Admission: RE | Admit: 2013-08-11 | Discharge: 2013-08-11 | Disposition: A | Discharge status: Home / Self Care | Payer: Medicare Other | Source: Ambulatory Visit | Attending: Neurological Surgery | Admitting: Neurological Surgery

## 2013-08-11 ENCOUNTER — Encounter (HOSPITAL_COMMUNITY): Payer: Self-pay

## 2013-08-11 HISTORY — DX: Other specified postprocedural states: Z98.890

## 2013-08-11 HISTORY — DX: Other complications of anesthesia, initial encounter: T88.59XA

## 2013-08-11 HISTORY — DX: Other seasonal allergic rhinitis: J30.2

## 2013-08-11 HISTORY — DX: Adverse effect of unspecified anesthetic, initial encounter: T41.45XA

## 2013-08-11 HISTORY — DX: Nausea with vomiting, unspecified: R11.2

## 2013-08-11 HISTORY — DX: Gastro-esophageal reflux disease without esophagitis: K21.9

## 2013-08-11 LAB — PROTIME-INR
INR: 1.23 (ref 0.00–1.49)
Prothrombin Time: 15.2 seconds (ref 11.6–15.2)

## 2013-08-11 LAB — CBC WITH DIFFERENTIAL/PLATELET
Basophils Absolute: 0 K/uL (ref 0.0–0.1)
Basophils Relative: 0 % (ref 0–1)
Eosinophils Absolute: 0.2 K/uL (ref 0.0–0.7)
Eosinophils Relative: 2 % (ref 0–5)
HCT: 31.1 % — ABNORMAL LOW (ref 36.0–46.0)
Hemoglobin: 10.3 g/dL — ABNORMAL LOW (ref 12.0–15.0)
Lymphocytes Relative: 23 % (ref 12–46)
Lymphs Abs: 2.1 K/uL (ref 0.7–4.0)
MCH: 29 pg (ref 26.0–34.0)
MCHC: 33.1 g/dL (ref 30.0–36.0)
MCV: 87.6 fL (ref 78.0–100.0)
Monocytes Absolute: 0.6 K/uL (ref 0.1–1.0)
Monocytes Relative: 7 % (ref 3–12)
Neutro Abs: 6.3 K/uL (ref 1.7–7.7)
Neutrophils Relative %: 68 % (ref 43–77)
Platelets: 477 K/uL — ABNORMAL HIGH (ref 150–400)
RBC: 3.55 MIL/uL — ABNORMAL LOW (ref 3.87–5.11)
RDW: 15.6 % — ABNORMAL HIGH (ref 11.5–15.5)
WBC: 9.2 K/uL (ref 4.0–10.5)

## 2013-08-11 LAB — TYPE AND SCREEN
ABO/RH(D): A POS
Antibody Screen: NEGATIVE

## 2013-08-11 LAB — BASIC METABOLIC PANEL
BUN: 9 mg/dL (ref 6–23)
CO2: 21 mEq/L (ref 19–32)
Calcium: 8.7 mg/dL (ref 8.4–10.5)
Chloride: 99 mEq/L (ref 96–112)
Creatinine, Ser: 0.49 mg/dL — ABNORMAL LOW (ref 0.50–1.10)
GFR calc Af Amer: >90 mL/min (ref >90)
GFR calc non Af Amer: >90 mL/min (ref >90)
Glucose, Bld: 102 mg/dL — ABNORMAL HIGH (ref 70–99)
Potassium: 4 mEq/L (ref 3.5–5.1)
Sodium: 133 mEq/L — ABNORMAL LOW (ref 135–145)

## 2013-08-11 LAB — SURGICAL PCR SCREEN
MRSA, PCR: NEGATIVE
Staphylococcus aureus: NEGATIVE

## 2013-08-11 LAB — ABO/RH: ABO/RH(D): A POS

## 2013-08-11 NOTE — Pre-Procedure Instructions (Addendum)
Katherine Larson  08/11/2013   Your procedure is scheduled on:  Thursday, September 25th.  Report to [facility], Main Entrance or Entrance "A" at 7:00 AM.  Call this number if you have problems the morning of surgery: [phone]   Remember:   Do not eat food or drink liquids after midnight.   Take these medicines the morning of surgery with A SIP OF WATER:fexofenadine (ALLEGRA) ,metoCLOPramide (REGLAN),,metoprolol succinate (TOPROL-XL).  Take if needed: oxyCODONE-acetaminophen (PERCOCET/ROXICET),nitroGLYCERIN (NITROSTAT).  Stop Xarelto 3 days prior as prescribed by Dr Ladona Ridgel. Stop vitamins and all herbal medications.   Do not wear jewelry, make-up or nail polish.  Do not wear lotions, powders, or perfumes. You may wear deodorant.  Do not shave 48 hours prior to surgery. Men may shave face and neck.  Do not bring valuables to the hospital.  [facility] is not responsible  for any belongings or valuables.  Contacts, dentures or bridgework may not be worn into surgery.  Leave suitcase in the car. After surgery it may be brought to your room.  For patients admitted to the hospital, checkout time is 11:00 AM the day of discharge.   Special Instructions: Shower using CHG 2 nights before surgery and the night before surgery.  If you shower the day of surgery use CHG.  Use special wash - you have one bottle of CHG for all showers.  You should use approximately 1/3 of the bottle for each shower.   Please read over the following fact sheets that you were given: Pain Booklet, Coughing and Deep Breathing, Blood Transfusion Information and Surgical Site Infection Prevention

## 2013-08-13 MED ORDER — DEXAMETHASONE SODIUM PHOSPHATE 10 MG/ML IJ SOLN
10.0000 mg | INTRAMUSCULAR | Status: AC
  Administered 2013-08-14: 10 mg via INTRAVENOUS
  Filled 2013-08-13: qty 1

## 2013-08-13 MED ORDER — VANCOMYCIN HCL IN DEXTROSE 1-5 GM/200ML-% IV SOLN
1000.0000 mg | INTRAVENOUS | Status: AC
  Administered 2013-08-14: 1000 mg via INTRAVENOUS
  Filled 2013-08-13: qty 200

## 2013-08-14 ENCOUNTER — Encounter (HOSPITAL_COMMUNITY)
Admission: RE | Disposition: A | Discharge status: Home / Self Care | Payer: Self-pay | Source: Ambulatory Visit | Attending: Neurological Surgery

## 2013-08-14 ENCOUNTER — Inpatient Hospital Stay (HOSPITAL_COMMUNITY)
Admission: RE | Admit: 2013-08-14 | Discharge: 2013-08-16 | DRG: 460 | Disposition: A | Discharge status: Home / Self Care | Payer: Medicare Other | Source: Ambulatory Visit | Attending: Neurological Surgery | Admitting: Neurological Surgery

## 2013-08-14 ENCOUNTER — Inpatient Hospital Stay (HOSPITAL_COMMUNITY): Payer: Medicare Other | Admitting: Certified Registered"

## 2013-08-14 ENCOUNTER — Encounter (HOSPITAL_COMMUNITY): Payer: Self-pay | Admitting: *Deleted

## 2013-08-14 ENCOUNTER — Inpatient Hospital Stay (HOSPITAL_COMMUNITY): Payer: Medicare Other

## 2013-08-14 ENCOUNTER — Encounter (HOSPITAL_COMMUNITY): Payer: Self-pay | Admitting: Certified Registered"

## 2013-08-14 DIAGNOSIS — M48061 Spinal stenosis, lumbar region without neurogenic claudication: Principal | ICD-10-CM | POA: Diagnosis present

## 2013-08-14 DIAGNOSIS — Q762 Congenital spondylolisthesis: Secondary | ICD-10-CM

## 2013-08-14 DIAGNOSIS — M7130 Other bursal cyst, unspecified site: Secondary | ICD-10-CM | POA: Diagnosis present

## 2013-08-14 DIAGNOSIS — M4716 Other spondylosis with myelopathy, lumbar region: Secondary | ICD-10-CM

## 2013-08-14 HISTORY — PX: MAXIMUM ACCESS (MAS)POSTERIOR LUMBAR INTERBODY FUSION (PLIF) 1 LEVEL: SHX6368

## 2013-08-14 LAB — GLUCOSE, CAPILLARY
Glucose-Capillary: 102 mg/dL — ABNORMAL HIGH (ref 70–99)
Glucose-Capillary: 143 mg/dL — ABNORMAL HIGH (ref 70–99)
Glucose-Capillary: 149 mg/dL — ABNORMAL HIGH (ref 70–99)
Glucose-Capillary: 100 mg/dL — ABNORMAL HIGH (ref 70–99)

## 2013-08-14 SURGERY — FOR MAXIMUM ACCESS (MAS) POSTERIOR LUMBAR INTERBODY FUSION (PLIF) 1 LEVEL
Anesthesia: General | Site: Back | Wound class: Clean

## 2013-08-14 MED ORDER — DEXAMETHASONE SODIUM PHOSPHATE 4 MG/ML IJ SOLN
4.0000 mg | Freq: Four times a day (QID) | INTRAMUSCULAR | Status: DC
  Filled 2013-08-14 (×8): qty 1

## 2013-08-14 MED ORDER — HYDROMORPHONE HCL PF 1 MG/ML IJ SOLN
0.2500 mg | INTRAMUSCULAR | Status: DC | PRN
  Administered 2013-08-14 (×2): 0.5 mg via INTRAVENOUS

## 2013-08-14 MED ORDER — PROPOFOL 10 MG/ML IV BOLUS
INTRAVENOUS | Status: DC | PRN
  Administered 2013-08-14: 120 mg via INTRAVENOUS

## 2013-08-14 MED ORDER — DM-GUAIFENESIN ER 30-600 MG PO TB12
1.0000 | ORAL_TABLET | Freq: Two times a day (BID) | ORAL | Status: DC
  Administered 2013-08-14 – 2013-08-16 (×4): 1 via ORAL
  Filled 2013-08-14 (×7): qty 1

## 2013-08-14 MED ORDER — ONDANSETRON HCL 4 MG/2ML IJ SOLN
INTRAMUSCULAR | Status: DC | PRN
  Administered 2013-08-14: 4 mg via INTRAVENOUS

## 2013-08-14 MED ORDER — OXYCODONE-ACETAMINOPHEN 5-325 MG PO TABS
1.0000 | ORAL_TABLET | ORAL | Status: DC | PRN
  Administered 2013-08-14 – 2013-08-16 (×7): 2 via ORAL
  Filled 2013-08-14 (×7): qty 2

## 2013-08-14 MED ORDER — SODIUM CHLORIDE 0.9 % IR SOLN
Status: DC | PRN
  Administered 2013-08-14: 10:00:00

## 2013-08-14 MED ORDER — THROMBIN 20000 UNITS EX SOLR
CUTANEOUS | Status: DC | PRN
  Administered 2013-08-14: 10:00:00 via TOPICAL

## 2013-08-14 MED ORDER — METFORMIN HCL 500 MG PO TABS
2000.0000 mg | ORAL_TABLET | Freq: Two times a day (BID) | ORAL | Status: DC
  Administered 2013-08-14 – 2013-08-16 (×4): 2000 mg via ORAL
  Filled 2013-08-14 (×6): qty 4

## 2013-08-14 MED ORDER — SODIUM CHLORIDE 0.9 % IJ SOLN: 3.0000 mL | INTRAMUSCULAR | Status: DC | PRN

## 2013-08-14 MED ORDER — LOSARTAN POTASSIUM 50 MG PO TABS
100.0000 mg | ORAL_TABLET | Freq: Every day | ORAL | Status: DC
  Administered 2013-08-15 – 2013-08-16 (×2): 100 mg via ORAL
  Filled 2013-08-14 (×2): qty 2

## 2013-08-14 MED ORDER — METHOCARBAMOL 100 MG/ML IJ SOLN
500.0000 mg | Freq: Once | INTRAVENOUS | Status: AC
  Administered 2013-08-14: 500 mg via INTRAVENOUS
  Filled 2013-08-14: qty 5

## 2013-08-14 MED ORDER — MIDAZOLAM HCL 5 MG/5ML IJ SOLN
INTRAMUSCULAR | Status: DC | PRN
  Administered 2013-08-14: 2 mg via INTRAVENOUS

## 2013-08-14 MED ORDER — ONDANSETRON HCL 4 MG/2ML IJ SOLN: 4.0000 mg | INTRAMUSCULAR | Status: DC | PRN

## 2013-08-14 MED ORDER — ALBUMIN HUMAN 5 % IV SOLN
INTRAVENOUS | Status: DC | PRN
  Administered 2013-08-14: 11:00:00 via INTRAVENOUS

## 2013-08-14 MED ORDER — BUPIVACAINE HCL (PF) 0.25 % IJ SOLN
INTRAMUSCULAR | Status: DC | PRN
  Administered 2013-08-14: 4 mL

## 2013-08-14 MED ORDER — METHOCARBAMOL 100 MG/ML IJ SOLN
500.0000 mg | Freq: Four times a day (QID) | INTRAVENOUS | Status: DC | PRN
  Filled 2013-08-14: qty 5

## 2013-08-14 MED ORDER — 0.9 % SODIUM CHLORIDE (POUR BTL) OPTIME
TOPICAL | Status: DC | PRN
  Administered 2013-08-14: 1000 mL

## 2013-08-14 MED ORDER — LIDOCAINE HCL (CARDIAC) 20 MG/ML IV SOLN
INTRAVENOUS | Status: DC | PRN
  Administered 2013-08-14: 40 mg via INTRAVENOUS

## 2013-08-14 MED ORDER — LACTATED RINGERS IV SOLN
INTRAVENOUS | Status: DC
  Administered 2013-08-14: 08:00:00 via INTRAVENOUS

## 2013-08-14 MED ORDER — SODIUM CHLORIDE 0.9 % IJ SOLN
3.0000 mL | Freq: Two times a day (BID) | INTRAMUSCULAR | Status: DC
  Administered 2013-08-15 (×2): 3 mL via INTRAVENOUS

## 2013-08-14 MED ORDER — METOPROLOL SUCCINATE ER 100 MG PO TB24
100.0000 mg | ORAL_TABLET | Freq: Every day | ORAL | Status: DC
  Administered 2013-08-15 – 2013-08-16 (×2): 100 mg via ORAL
  Filled 2013-08-14 (×4): qty 1

## 2013-08-14 MED ORDER — DEXAMETHASONE 4 MG PO TABS
4.0000 mg | ORAL_TABLET | Freq: Four times a day (QID) | ORAL | Status: DC
  Administered 2013-08-14 – 2013-08-16 (×8): 4 mg via ORAL
  Filled 2013-08-14 (×11): qty 1

## 2013-08-14 MED ORDER — HYDROCHLOROTHIAZIDE 25 MG PO TABS
25.0000 mg | ORAL_TABLET | Freq: Every day | ORAL | Status: DC
  Administered 2013-08-15 – 2013-08-16 (×2): 25 mg via ORAL
  Filled 2013-08-14 (×2): qty 1

## 2013-08-14 MED ORDER — ONDANSETRON HCL 4 MG/2ML IJ SOLN: 4.0000 mg | Freq: Once | INTRAMUSCULAR | Status: DC | PRN

## 2013-08-14 MED ORDER — MORPHINE SULFATE 2 MG/ML IJ SOLN
1.0000 mg | INTRAMUSCULAR | Status: DC | PRN
  Administered 2013-08-14: 2 mg via INTRAVENOUS
  Administered 2013-08-14: 4 mg via INTRAVENOUS
  Administered 2013-08-15 (×2): 2 mg via INTRAVENOUS
  Filled 2013-08-14: qty 1
  Filled 2013-08-14: qty 2
  Filled 2013-08-14 (×2): qty 1

## 2013-08-14 MED ORDER — FENTANYL CITRATE 0.05 MG/ML IJ SOLN
INTRAMUSCULAR | Status: DC | PRN
  Administered 2013-08-14 (×2): 100 ug via INTRAVENOUS
  Administered 2013-08-14: 50 ug via INTRAVENOUS
  Administered 2013-08-14: 100 ug via INTRAVENOUS

## 2013-08-14 MED ORDER — HYDROMORPHONE HCL PF 1 MG/ML IJ SOLN
INTRAMUSCULAR | Status: AC
  Filled 2013-08-14: qty 1

## 2013-08-14 MED ORDER — ACETAMINOPHEN 325 MG PO TABS: 650.0000 mg | ORAL_TABLET | ORAL | Status: DC | PRN

## 2013-08-14 MED ORDER — METOCLOPRAMIDE HCL 5 MG PO TABS
5.0000 mg | ORAL_TABLET | Freq: Two times a day (BID) | ORAL | Status: DC
Start: 2013-08-14 — End: 2013-08-16
  Administered 2013-08-14 – 2013-08-16 (×4): 5 mg via ORAL
  Filled 2013-08-14 (×6): qty 1

## 2013-08-14 MED ORDER — SCOPOLAMINE 1 MG/3DAYS TD PT72
1.0000 | MEDICATED_PATCH | TRANSDERMAL | Status: AC
  Administered 2013-08-14: 1 via TRANSDERMAL
  Filled 2013-08-14: qty 1

## 2013-08-14 MED ORDER — PHENOL 1.4 % MT LIQD: 1.0000 | OROMUCOSAL | Status: DC | PRN

## 2013-08-14 MED ORDER — NIACIN ER (ANTIHYPERLIPIDEMIC) 500 MG PO TBCR
2000.0000 mg | EXTENDED_RELEASE_TABLET | Freq: Every day | ORAL | Status: DC
  Administered 2013-08-14: 2000 mg via ORAL
  Filled 2013-08-14 (×3): qty 4

## 2013-08-14 MED ORDER — POTASSIUM CHLORIDE IN NACL 20-0.9 MEQ/L-% IV SOLN
INTRAVENOUS | Status: DC
  Administered 2013-08-14 – 2013-08-15 (×2): via INTRAVENOUS
  Filled 2013-08-14 (×5): qty 1000

## 2013-08-14 MED ORDER — ACETAMINOPHEN 650 MG RE SUPP: 650.0000 mg | RECTAL | Status: DC | PRN

## 2013-08-14 MED ORDER — MENTHOL 3 MG MT LOZG: 1.0000 | LOZENGE | OROMUCOSAL | Status: DC | PRN

## 2013-08-14 MED ORDER — CEFAZOLIN SODIUM 1-5 GM-% IV SOLN
1.0000 g | Freq: Three times a day (TID) | INTRAVENOUS | Status: AC
  Administered 2013-08-14 (×2): 1 g via INTRAVENOUS
  Filled 2013-08-14 (×2): qty 50

## 2013-08-14 MED ORDER — LACTATED RINGERS IV SOLN
INTRAVENOUS | Status: DC | PRN
  Administered 2013-08-14 (×2): via INTRAVENOUS

## 2013-08-14 MED ORDER — SUCCINYLCHOLINE CHLORIDE 20 MG/ML IJ SOLN
INTRAMUSCULAR | Status: DC | PRN
  Administered 2013-08-14: 100 mg via INTRAVENOUS

## 2013-08-14 MED ORDER — BISACODYL 5 MG PO TBEC: 5.0000 mg | DELAYED_RELEASE_TABLET | Freq: Every day | ORAL | Status: DC | PRN

## 2013-08-14 MED ORDER — PIOGLITAZONE HCL 45 MG PO TABS
45.0000 mg | ORAL_TABLET | Freq: Every day | ORAL | Status: DC
  Administered 2013-08-15 – 2013-08-16 (×2): 45 mg via ORAL
  Filled 2013-08-14 (×3): qty 1

## 2013-08-14 MED ORDER — SODIUM CHLORIDE 0.9 % IV SOLN
250.0000 mL | INTRAVENOUS | Status: DC
Start: 2013-08-14 — End: 2013-08-16

## 2013-08-14 MED ORDER — HYDROMORPHONE HCL PF 1 MG/ML IJ SOLN
INTRAMUSCULAR | Status: DC | PRN
  Administered 2013-08-14: 1 mg via INTRAVENOUS

## 2013-08-14 MED ORDER — FENOFIBRATE 54 MG PO TABS
54.0000 mg | ORAL_TABLET | Freq: Every day | ORAL | Status: DC
  Administered 2013-08-14 – 2013-08-16 (×3): 54 mg via ORAL
  Filled 2013-08-14 (×3): qty 1

## 2013-08-14 MED ORDER — METHOCARBAMOL 500 MG PO TABS
500.0000 mg | ORAL_TABLET | Freq: Four times a day (QID) | ORAL | Status: DC | PRN
  Administered 2013-08-14 – 2013-08-15 (×2): 500 mg via ORAL
  Filled 2013-08-14 (×2): qty 1

## 2013-08-14 MED ORDER — NITROGLYCERIN 0.4 MG SL SUBL: 0.4000 mg | SUBLINGUAL_TABLET | SUBLINGUAL | Status: DC | PRN

## 2013-08-14 MED ORDER — AMITRIPTYLINE HCL 10 MG PO TABS
20.0000 mg | ORAL_TABLET | Freq: Every day | ORAL | Status: DC
  Administered 2013-08-14 – 2013-08-15 (×2): 20 mg via ORAL
  Filled 2013-08-14 (×2): qty 2

## 2013-08-14 MED ORDER — SENNA 8.6 MG PO TABS
1.0000 | ORAL_TABLET | Freq: Two times a day (BID) | ORAL | Status: DC
  Administered 2013-08-14 (×2): 8.6 mg via ORAL
  Filled 2013-08-14 (×5): qty 1

## 2013-08-14 MED ORDER — PHENYLEPHRINE HCL 10 MG/ML IJ SOLN
10.0000 mg | INTRAVENOUS | Status: DC | PRN
  Administered 2013-08-14: 5 ug/min via INTRAVENOUS

## 2013-08-14 MED ORDER — LOSARTAN POTASSIUM-HCTZ 100-25 MG PO TABS: 1.0000 | ORAL_TABLET | Freq: Every day | ORAL | Status: DC

## 2013-08-14 SURGICAL SUPPLY — 64 items
BAG DECANTER FOR FLEXI CONT (MISCELLANEOUS) ×2 IMPLANT
BENZOIN TINCTURE PRP APPL 2/3 (GAUZE/BANDAGES/DRESSINGS) ×2 IMPLANT
BLADE SURG ROTATE 9660 (MISCELLANEOUS) IMPLANT
BONE MATRIX OSTEOCEL PRO MED (Bone Implant) ×2 IMPLANT
BUR MATCHSTICK NEURO 3.0 LAGG (BURR) ×2 IMPLANT
CAGE COROENT MP 11X23X9 (Cage) ×4 IMPLANT
CANISTER SUCTION 2500CC (MISCELLANEOUS) ×2 IMPLANT
CLIP NEUROVISION LG (CLIP) ×2 IMPLANT
CLOTH BEACON ORANGE TIMEOUT ST (SAFETY) ×2 IMPLANT
CONT SPEC 4OZ CLIKSEAL STRL BL (MISCELLANEOUS) ×4 IMPLANT
COVER BACK TABLE 24X17X13 BIG (DRAPES) IMPLANT
COVER TABLE BACK 60X90 (DRAPES) ×2 IMPLANT
DRAPE C-ARM 42X72 X-RAY (DRAPES) ×2 IMPLANT
DRAPE C-ARMOR (DRAPES) ×2 IMPLANT
DRAPE LAPAROTOMY 100X72X124 (DRAPES) ×2 IMPLANT
DRAPE POUCH INSTRU U-SHP 10X18 (DRAPES) ×2 IMPLANT
DRAPE SURG 17X23 STRL (DRAPES) ×2 IMPLANT
DRESSING TELFA 8X3 (GAUZE/BANDAGES/DRESSINGS) ×2 IMPLANT
DRSG OPSITE 4X5.5 SM (GAUZE/BANDAGES/DRESSINGS) ×4 IMPLANT
DURAPREP 26ML APPLICATOR (WOUND CARE) ×2 IMPLANT
ELECT REM PT RETURN 9FT ADLT (ELECTROSURGICAL) ×2
ELECTRODE REM PT RTRN 9FT ADLT (ELECTROSURGICAL) ×1 IMPLANT
EVACUATOR 1/8 PVC DRAIN (DRAIN) ×2 IMPLANT
GAUZE SPONGE 4X4 16PLY XRAY LF (GAUZE/BANDAGES/DRESSINGS) IMPLANT
GLOVE BIO SURGEON STRL SZ8 (GLOVE) ×2 IMPLANT
GLOVE BIOGEL PI IND STRL 7.0 (GLOVE) ×4 IMPLANT
GLOVE BIOGEL PI INDICATOR 7.0 (GLOVE) ×4
GLOVE ECLIPSE 7.5 STRL STRAW (GLOVE) ×2 IMPLANT
GLOVE INDICATOR 7.5 STRL GRN (GLOVE) ×2 IMPLANT
GLOVE SS BIOGEL STRL SZ 6.5 (GLOVE) ×4 IMPLANT
GLOVE SUPERSENSE BIOGEL SZ 6.5 (GLOVE) ×4
GOWN BRE IMP SLV AUR LG STRL (GOWN DISPOSABLE) ×4 IMPLANT
GOWN BRE IMP SLV AUR XL STRL (GOWN DISPOSABLE) ×6 IMPLANT
GOWN STRL REIN 2XL LVL4 (GOWN DISPOSABLE) IMPLANT
HEMOSTAT POWDER KIT SURGIFOAM (HEMOSTASIS) IMPLANT
KIT BASIN OR (CUSTOM PROCEDURE TRAY) ×2 IMPLANT
KIT NEEDLE NVM5 EMG ELECT (KITS) ×1 IMPLANT
KIT NEEDLE NVM5 EMG ELECTRODE (KITS) ×1
KIT ROOM TURNOVER OR (KITS) ×2 IMPLANT
MILL MEDIUM DISP (BLADE) ×2 IMPLANT
NEEDLE HYPO 25X1 1.5 SAFETY (NEEDLE) ×2 IMPLANT
NS IRRIG 1000ML POUR BTL (IV SOLUTION) ×2 IMPLANT
PACK LAMINECTOMY NEURO (CUSTOM PROCEDURE TRAY) ×2 IMPLANT
PAD ARMBOARD 7.5X6 YLW CONV (MISCELLANEOUS) ×6 IMPLANT
ROD 5.5X40MM (Rod) ×4 IMPLANT
SCREW LOCK (Screw) ×4 IMPLANT
SCREW LOCK FXNS SPNE MAS PL (Screw) ×4 IMPLANT
SCREW MAS PLIF 5.5X30 (Screw) ×2 IMPLANT
SCREW PAS PLIF 5X30 (Screw) ×2 IMPLANT
SCREW SHANK 5.0X30MM (Screw) ×4 IMPLANT
SCREW TULIP 5.5 (Screw) ×4 IMPLANT
SPONGE LAP 4X18 X RAY DECT (DISPOSABLE) IMPLANT
SPONGE SURGIFOAM ABS GEL 100 (HEMOSTASIS) ×2 IMPLANT
STRIP CLOSURE SKIN 1/2X4 (GAUZE/BANDAGES/DRESSINGS) ×2 IMPLANT
SUT VIC AB 0 CT1 18XCR BRD8 (SUTURE) ×1 IMPLANT
SUT VIC AB 0 CT1 8-18 (SUTURE) ×1
SUT VIC AB 2-0 CP2 18 (SUTURE) ×2 IMPLANT
SUT VIC AB 3-0 SH 8-18 (SUTURE) ×4 IMPLANT
SYR 20ML ECCENTRIC (SYRINGE) ×2 IMPLANT
TOWEL OR 17X24 6PK STRL BLUE (TOWEL DISPOSABLE) ×2 IMPLANT
TOWEL OR 17X26 10 PK STRL BLUE (TOWEL DISPOSABLE) ×2 IMPLANT
TRAY FOLEY BAG SILVER LF 14FR (CATHETERS) ×2 IMPLANT
TRAY FOLEY CATH 14FRSI W/METER (CATHETERS) ×2 IMPLANT
WATER STERILE IRR 1000ML POUR (IV SOLUTION) ×2 IMPLANT

## 2013-08-14 NOTE — Transfer of Care (Signed)
Immediate Anesthesia Transfer of Care Note  Patient: Krystal Eaton  Procedure(s) Performed: Procedure(s) with comments:  MAXIMUM ACCESS SURGERY(MAS) POSTERIOR LUMBAR INTERBODY FUSION LUMBAR THREE-FOUR  (N/A) -  MAXIMUM ACCESS SURGERY(MAS) POSTERIOR LUMBAR INTERBODY FUSION LUMBAR THREE-FOUR   Patient Location: PACU  Anesthesia Type:General  Level of Consciousness: sedated  Airway & Oxygen Therapy: Patient Spontanous Breathing and Patient connected to nasal cannula oxygen  Post-op Assessment: Report given to PACU RN and Post -op Vital signs reviewed and stable  Post vital signs: Reviewed and stable  Complications: No apparent anesthesia complications

## 2013-08-14 NOTE — H&P (Signed)
Subjective: Patient is a 67 y.o. female admitted for PLIF L3-4. Onset of symptoms was several months ago, gradually worsening since that time.  The pain is rated severe, and is located at the across the lower back and radiates to legs. The pain is described as aching and occurs all day. The symptoms have been progressive. Symptoms are exacerbated by exercise. 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   Past Medical History  Diagnosis Date  . NSVT (nonsustained ventricular tachycardia)   . DM2 (diabetes mellitus, type 2)   . CAD (coronary artery disease)     s/p CABG 1996;  Myoview 3/11: mod partially reversible AS perfusion defect, most likely related to variable breast attenuation although an element of scar with peri-infarct ischemia also possible, EF 54% - felt to be low risk  . HTN (hypertension)   . HLD (hyperlipidemia)   . Asthma   . Neuropathy   . Complication of anesthesia   . PONV (postoperative nausea and vomiting)   . Shortness of breath     with activity  . Seasonal allergies   . GERD (gastroesophageal reflux disease)   . Constipation   . Diarrhea     Past Surgical History  Procedure Laterality Date  . Cholecystectomy    . Vesicovaginal fistula closure w/ tah    . Ptca    . Coronary artery bypass graft  1996    2 vessels  . Hernia repair      hital hernia  . Eye surgery Bilateral   . Abdominal hysterectomy    . Fracture surgery Right     arm    Prior to Admission medications   Medication Sig Start Date End Date Taking? Authorizing Provider  amitriptyline (ELAVIL) 10 MG tablet Take 20 mg by mouth daily.    Yes Historical Provider, MD  atorvastatin (LIPITOR) 40 MG tablet Take 40 mg by mouth daily.   Yes Historical Provider, MD  dextromethorphan-guaiFENesin (MUCINEX DM) 30-600 MG per 12 hr tablet Take 1 tablet by mouth every 12 (twelve) hours.   Yes Historical Provider, MD  fenofibrate (TRICOR) 145 MG tablet Take 145 mg by mouth daily.     Yes  Historical Provider, MD  fexofenadine (ALLEGRA) 180 MG tablet Take 180 mg by mouth daily.     Yes Historical Provider, MD  losartan-hydrochlorothiazide (HYZAAR) 100-25 MG per tablet Take 1 tablet by mouth daily.     Yes Historical Provider, MD  metFORMIN (GLUCOPHAGE) 1000 MG tablet Take 2,000 mg by mouth 2 (two) times daily with a meal.     Yes Historical Provider, MD  metoCLOPramide (REGLAN) 5 MG tablet Take 5 mg by mouth 2 (two) times daily.    Yes Historical Provider, MD  metoprolol succinate (TOPROL-XL) 100 MG 24 hr tablet Take 100 mg by mouth daily. Take with or immediately following a meal.   Yes Historical Provider, MD  niacin (NIASPAN) 1000 MG CR tablet Take 2,000 mg by mouth at bedtime.     Yes Historical Provider, MD  Omega-3 Fatty Acids (FISH OIL) 1000 MG CAPS Take 1 capsule by mouth 3 (three) times daily.     Yes Historical Provider, MD  oxyCODONE-acetaminophen (PERCOCET/ROXICET) 5-325 MG per tablet Take 1-2 tablets by mouth every 6 (six) hours as needed for pain.   Yes Historical Provider, MD  pioglitazone (ACTOS) 45 MG tablet Take 45 mg by mouth daily.     Yes Historical Provider, MD  Rivaroxaban (XARELTO) 20 MG TABS tablet Take 1  tablet (20 mg total) by mouth daily. 07/16/13  Yes Wendall Stade, MD  nitroGLYCERIN (NITROSTAT) 0.4 MG SL tablet Place 0.4 mg under the tongue every 5 (five) minutes as needed for chest pain.    Historical Provider, MD   Allergies  Allergen Reactions  . Biaxin [Clarithromycin] Rash and Other (See Comments)    Blisters in mouth   . Penicillins Rash and Other (See Comments)    Blisters in mouth     History  Substance Use Topics  . Smoking status: Former Smoker -- 15 years    Quit date: 11/20/1994  . Smokeless tobacco: Not on file  . Alcohol Use: No    Family History  Problem Relation Age of Onset  . Heart attack Father   . Heart attack Mother      Review of Systems  Positive ROS: neg  All other systems have been reviewed and were otherwise  negative with the exception of those mentioned in the HPI and as above.  Objective: Vital signs in last 24 hours: Temp:  [98.3 F (36.8 C)] 98.3 F (36.8 C) (09/25 0725) Pulse Rate:  [104] 104 (09/25 0725) Resp:  [18] 18 (09/25 0725) BP: (131)/(53) 131/53 mmHg (09/25 0725) SpO2:  [96 %] 96 % (09/25 0725)  General Appearance: Alert, cooperative, no distress, appears stated age Head: Normocephalic, without obvious abnormality, atraumatic Eyes: PERRL, conjunctiva/corneas clear, EOM's intact    Neck: Supple, symmetrical, trachea midline Back: Symmetric, no curvature, ROM normal, no CVA tenderness Lungs:  respirations unlabored Heart: Regular rate and rhythm Abdomen: Soft, non-tender Extremities: Extremities normal, atraumatic, no cyanosis or edema Pulses: 2+ and symmetric all extremities Skin: Skin color, texture, turgor normal, no rashes or lesions  NEUROLOGIC:   Mental status: Alert and oriented x4,  no aphasia, good attention span, fund of knowledge, and memory Motor Exam - grossly normal Sensory Exam - grossly normal Reflexes: trace Coordination - grossly normal Gait - grossly normal Balance - grossly normal Cranial Nerves: I: smell Not tested  II: visual acuity  OS: nl    OD: nl  II: visual fields Full to confrontation  II: pupils Equal, round, reactive to light  III,VII: ptosis None  III,IV,VI: extraocular muscles  Full ROM  V: mastication Normal  V: facial light touch sensation  Normal  V,VII: corneal reflex  Present  VII: facial muscle function - upper  Normal  VII: facial muscle function - lower Normal  VIII: hearing Not tested  IX: soft palate elevation  Normal  IX,X: gag reflex Present  XI: trapezius strength  5/5  XI: sternocleidomastoid strength 5/5  XI: neck flexion strength  5/5  XII: tongue strength  Normal    Data Review Lab Results  Component Value Date   WBC 9.2 08/11/2013   HGB 10.3* 08/11/2013   HCT 31.1* 08/11/2013   MCV 87.6 08/11/2013   PLT  477* 08/11/2013   Lab Results  Component Value Date   NA 133* 08/11/2013   K 4.0 08/11/2013   CL 99 08/11/2013   CO2 21 08/11/2013   BUN 9 08/11/2013   CREATININE 0.49* 08/11/2013   GLUCOSE 102* 08/11/2013   Lab Results  Component Value Date   INR 1.23 08/11/2013    Assessment/Plan: Patient admitted for PLIF L3-4. Patient has failed a reasonable attempt at conservative therapy.  I explained the condition and procedure to the patient and answered any questions.  Patient wishes to proceed with procedure as planned. Understands risks/ benefits and typical outcomes of  procedure.   Katherine Larson S 08/14/2013 9:14 AM

## 2013-08-14 NOTE — Anesthesia Postprocedure Evaluation (Signed)
  Anesthesia Post-op Note  Patient: Katherine Larson  Procedure(s) Performed: Procedure(s) with comments:  MAXIMUM ACCESS SURGERY(MAS) POSTERIOR LUMBAR INTERBODY FUSION LUMBAR THREE-FOUR  (N/A) -  MAXIMUM ACCESS SURGERY(MAS) POSTERIOR LUMBAR INTERBODY FUSION LUMBAR THREE-FOUR   Patient Location: PACU  Anesthesia Type:General  Level of Consciousness: awake, alert  and oriented  Airway and Oxygen Therapy: Patient Spontanous Breathing and Patient connected to nasal cannula oxygen  Post-op Pain: mild  Post-op Assessment: Post-op Vital signs reviewed, Patient's Cardiovascular Status Stable, Respiratory Function Stable, Patent Airway and Pain level controlled  Post-op Vital Signs: stable  Complications: No apparent anesthesia complications

## 2013-08-14 NOTE — Anesthesia Preprocedure Evaluation (Addendum)
Anesthesia Evaluation  Patient identified by MRN, date of birth, ID band Patient awake    Reviewed: Allergy & Precautions, H&P , NPO status , Patient's Chart, lab work & pertinent test results  History of Anesthesia Complications (+) PONV  Airway Mallampati: II      Dental  (+) Edentulous Upper, Edentulous Lower and Dental Advisory Given   Pulmonary asthma ,  breath sounds clear to auscultation        Cardiovascular hypertension, Pt. on medications + CAD + dysrhythmias Atrial Fibrillation Rhythm:Regular Rate:Normal     Neuro/Psych    GI/Hepatic Neg liver ROS, GERD-  Controlled and Medicated,  Endo/Other  diabetes  Renal/GU negative Renal ROS     Musculoskeletal   Abdominal (+) + obese,   Peds  Hematology   Anesthesia Other Findings   Reproductive/Obstetrics                          Anesthesia Physical Anesthesia Plan  ASA: III  Anesthesia Plan: General   Post-op Pain Management:    Induction: Intravenous  Airway Management Planned: Oral ETT  Additional Equipment:   Intra-op Plan:   Post-operative Plan: Extubation in OR  Informed Consent: I have reviewed the patients History and Physical, chart, labs and discussed the procedure including the risks, benefits and alternatives for the proposed anesthesia with the patient or authorized representative who has indicated his/her understanding and acceptance.     Plan Discussed with: CRNA and Anesthesiologist  Anesthesia Plan Comments: (Spondylolisthesis L3-4 CAD S/P CABG 1996. No angina, myoview (-) 01/2010, Nl EF by ECHO 07/23/13, felt to be low risk by Dr. Ladona Ridgel Recent H/O atrial flutter, resolved spontaneously, treated with Xarelto, held since 9/21 Type 2 DM glucose 102 Hypertension   Plan GA with oral ETT  Abrasion on right eye lid present on arrival to holding- pt reports from waxing eyebrow    Kipp Brood )       Anesthesia Quick Evaluation

## 2013-08-14 NOTE — Op Note (Signed)
08/14/2013  12:34 PM  PATIENT:  Katherine Larson  67 y.o. 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 Decompressive lumbar laminectomy L3-4 requiring more work than would be required for a simple exposure of the disk for PLIF in order to adequately decompress the neural elements and address the spinal stenosis 2. Posterior lumbar interbody fusion L3-4 using PEEK interbody cages packed with morcellized allograft and autograft 3. Posterior fixation L3-4 using pedicle screws.    SURGEON:  Marikay Alar, MD  ASSISTANTS: Dr. Phoebe Perch  ANESTHESIA:  General  EBL: 100 ml  Total I/O In: 1250 [I.V.:1000; IV Piggyback:250] Out: 240 [Urine:140; Blood:100]  BLOOD ADMINISTERED:none  DRAINS: Hemovac   INDICATION FOR PROCEDURE: This patient had long history of back with bilateral leg pain. She had a CT myelogram showed severe spinal stenosis at L3-4 with synovial cyst and segmental instability with a grade 1 spondylolisthesis. She tried medical management quite some time without significant relief. I recommended a decompression and instrumented fusion to address her stenosis and segmental instability. Patient understood the risks, benefits, and alternatives and potential outcomes and wished to proceed.  PROCEDURE DETAILS:  The patient was brought to the operating room. After induction of generalized endotracheal anesthesia the patient was rolled into the prone position on chest rolls and all pressure points were padded. The patient's lumbar region was cleaned and then prepped with DuraPrep and draped in the usual sterile fashion. Anesthesia was injected and then a dorsal midline incision was made and carried down to the lumbosacral fascia. The fascia was opened and the paraspinous musculature was taken down in a subperiosteal fashion to expose L3-4. A self-retaining  retractor was placed. Intraoperative fluoroscopy confirmed my level, and I started with placement of the L3 cortical pedicle screws. The pedicle screw entry zones were identified utilizing surface landmarks and  AP and lateral fluoroscopy. I scored the cortex with the high-speed drill and then used the hand drill and EMG monitoring to drill an upward and outward direction into the pedicle. I then tapped line to line, and the tap was also monitored. I then placed a 5-0 by 30 mm cortical pedicle screw into the pedicles of L3 bilaterally. I then turned my attention to the decompression and the spinous process was removed and complete lumbar laminectomies, hemi- facetectomies, and foraminotomies were performed at L3-4. She had obvious instability and diastases of L3-4 facet on the right. The patient had significant spinal stenosis and this required more work than would be required for a simple exposure of the disc for posterior lumbar interbody fusion. Much more generous decompression was undertaken in order to adequately decompress the neural elements and address the patient's leg pain. The yellow ligament was removed to expose the underlying dura and nerve roots, and generous foraminotomies were performed to adequately decompress the neural elements. Both the exiting and traversing nerve roots were decompressed on both sides until a coronary dilator passed easily along the nerve roots. Once the decompression was complete, I turned my attention to the posterior lower lumbar interbody fusion. The epidural venous vasculature was coagulated and cut sharply. Disc space was incised and the initial discectomy was performed with pituitary rongeurs. The disc space was distracted with sequential distractors to a height of 12 mm. We then used a series of scrapers and shavers to prepare the endplates for fusion. The midline was prepared with Epstein curettes.  Once the complete discectomy was finished, we packed an appropriate  sized peek interbody cage with local autograft and morcellized allograft, gently retracted the nerve root, and tapped the cage into position at L3-4.  The midline between the cages was packed with morselized autograft and allograft. We then turned our attention to the placement of the lower pedicle screws. The pedicle screw entry zones were identified utilizing surface landmarks and fluoroscopy. I drilled into each pedicle utilizing the hand drill and EMG monitoring, and tapped each pedicle with the appropriate tap. We palpated with a ball probe to assure no break in the cortex. We then placed via-0 x 30 mm pedicle screws into the pedicles bilaterally at L4. We then placed lordotic rods into the multiaxial screw heads of the pedicle screws and locked these in position with the locking caps and anti-torque device. We then checked our construct with AP and lateral fluoroscopy. Irrigated with copious amounts of bacitracin-containing saline solution. Placed a medium Hemovac drain through separate stab incision. Inspected the nerve roots once again to assure adequate decompression, lined to the dura with Gelfoam, and closed the muscle and the fascia with 0 Vicryl. Closed the subcutaneous tissues with 2-0 Vicryl and subcuticular tissues with 3-0 Vicryl. The skin was closed with benzoin and Steri-Strips. Dressing was then applied, the patient was awakened from general anesthesia and transported to the recovery room in stable condition. At the end of the procedure all sponge, needle and instrument counts were correct.   PLAN OF CARE: Admit to inpatient   PATIENT DISPOSITION:  PACU - hemodynamically stable.   Delay start of Pharmacological VTE agent (>24hrs) due to surgical blood loss or risk of bleeding:  yes

## 2013-08-14 NOTE — Preoperative (Signed)
Beta Blockers   Reason not to administer Beta Blockers:Not Applicable 

## 2013-08-14 NOTE — Progress Notes (Signed)
UR COMPLETED  

## 2013-08-14 NOTE — Anesthesia Procedure Notes (Signed)
Procedure Name: Intubation Date/Time: 08/14/2013 9:30 AM Performed by: Armandina Gemma Pre-anesthesia Checklist: Patient identified, Timeout performed, Emergency Drugs available, Suction available and Patient being monitored Patient Re-evaluated:Patient Re-evaluated prior to inductionOxygen Delivery Method: Circle system utilized Preoxygenation: Pre-oxygenation with 100% oxygen Intubation Type: IV induction Ventilation: Mask ventilation without difficulty Laryngoscope Size: Miller and 2 Grade View: Grade I Tube type: Oral Tube size: 7.0 mm Number of attempts: 1 Airway Equipment and Method: Stylet Placement Confirmation: ETT inserted through vocal cords under direct vision,  breath sounds checked- equal and bilateral and positive ETCO2 Secured at: 22 cm Tube secured with: Tape Dental Injury: Teeth and Oropharynx as per pre-operative assessment  Comments: IV induction Joslin intubation AM CRNA atraumatic - no teeth present, no damage to gums or lips

## 2013-08-15 ENCOUNTER — Encounter (HOSPITAL_COMMUNITY): Payer: Self-pay | Admitting: Neurological Surgery

## 2013-08-15 LAB — GLUCOSE, CAPILLARY
Glucose-Capillary: 139 mg/dL — ABNORMAL HIGH (ref 70–99)
Glucose-Capillary: 141 mg/dL — ABNORMAL HIGH (ref 70–99)
Glucose-Capillary: 148 mg/dL — ABNORMAL HIGH (ref 70–99)

## 2013-08-15 MED ORDER — AMITRIPTYLINE HCL 10 MG PO TABS
20.0000 mg | ORAL_TABLET | Freq: Every day | ORAL | Status: DC
  Filled 2013-08-15: qty 2

## 2013-08-15 NOTE — Evaluation (Signed)
Physical Therapy Evaluation Patient Details Name: Katherine Larson MRN: [id number] DOB: 02-25-1946 Today's Date: 08/15/2013 Time: 7829-5621 PT Time Calculation (min): 21 min  PT Assessment / Plan / Recommendation History of Present Illness  67 yo female s/p PLIF L3-4 with brace  Clinical Impression  Patient is s/p above surgery resulting in the deficits listed below (see PT Problem List). Patient will benefit from skilled PT to increase their independence and safety with mobility (while adhering to their precautions) to allow discharge home with family      PT Assessment  Patient needs continued PT services    Follow Up Recommendations  Home health PT;Supervision/Assistance - 24 hour    Does the patient have the potential to tolerate intense rehabilitation      Barriers to Discharge        Equipment Recommendations  None recommended by PT    Recommendations for Other Services     Frequency Min 5X/week    Precautions / Restrictions Precautions Precautions: Back Precaution Booklet Issued: Yes (comment) Precaution Comments: pt able to state 1/3 precautions from earlier education by OT Required Braces or Orthoses: Spinal Brace Spinal Brace: Lumbar corset;Applied in sitting position   Pertinent Vitals/Pain 5/10 back pain; patient repositioned for comfort       Mobility  Bed Mobility Bed Mobility: Not assessed Transfers Transfers: Sit to Stand;Stand to Sit Sit to Stand: 4: Min assist;With upper extremity assist Stand to Sit: 4: Min guard;With upper extremity assist;To chair/3-in-1 Details for Transfer Assistance: vc for proper alignment prior to stand (out to front edge of chair) and for safe use of RW/hand placement Ambulation/Gait Ambulation/Gait Assistance: 4: Min guard Ambulation Distance (Feet): 300 Feet Assistive device: Rolling walker Ambulation/Gait Assistance Details: vc for safe use of RW/sequencing; vc for upright posture and keeping RW closer to her  body Gait Pattern: Step-through pattern;Decreased stride length;Trunk flexed    Exercises     PT Diagnosis: Difficulty walking;Acute pain  PT Problem List: Decreased activity tolerance;Decreased balance;Decreased mobility;Decreased knowledge of use of DME;Decreased knowledge of precautions;Pain PT Treatment Interventions: DME instruction;Gait training;Stair training;Functional mobility training;Therapeutic activities;Patient/family education     PT Goals(Current goals can be found in the care plan section) Acute Rehab PT Goals Patient Stated Goal: to go home soon PT Goal Formulation: With patient Time For Goal Achievement: 08/20/13 Potential to Achieve Goals: Good  Visit Information  Last PT Received On: 08/15/13 Assistance Needed: +1 History of Present Illness: 67 yo female s/p PLIF L3-4 with brace       Prior Functioning  Home Living Family/patient expects to be discharged to:: Private residence Living Arrangements: Spouse/significant other;Children Available Help at Discharge: Family;Available 24 hours/day Type of Home: House Home Access: Stairs to enter [employer] of Steps: 2 Entrance Stairs-Rails: None Home Layout: One level Home Equipment: Toilet riser;Walker - 2 wheels Prior Function Level of Independence: Independent Communication Communication: No difficulties Dominant Hand: Right    Cognition  Cognition Arousal/Alertness: Awake/alert Behavior During Therapy: WFL for tasks assessed/performed Overall Cognitive Status: Within Functional Limits for tasks assessed    Extremity/Trunk Assessment Upper Extremity Assessment Upper Extremity Assessment: Defer to OT evaluation RUE Deficits / Details: rotator cuff injury with AROM ~70 degrees, pt uses neck flexion to reach hair LUE Deficits / Details: AROM ~90 degrees difficulty with internal rotation to don brace Lower Extremity Assessment Lower Extremity Assessment: Overall WFL for tasks  assessed Cervical / Trunk Assessment Cervical / Trunk Assessment: Kyphotic   Balance    End of Session PT -  End of Session Equipment Utilized During Treatment: Gait belt;Back brace Activity Tolerance: Patient tolerated treatment well Patient left: in chair;with call bell/phone within reach;with family/visitor present  GP     Katherine Larson 08/15/2013, 12:15 PM Pager [phone]

## 2013-08-15 NOTE — Progress Notes (Signed)
Patient ID: Katherine Larson, female   DOB: [birth year], 67 y.o.   MRN: [id number] Doing very well. No leg pain. Back sore. Good strength. Walked 300 feet with PT. Pleased with progress. Home tomorrow?

## 2013-08-15 NOTE — Care Management Note (Signed)
    Page 1 of 2   08/16/2013     5:27:23 PM   CARE MANAGEMENT NOTE 08/16/2013  Patient:  Katherine Larson, Katherine Larson   Account Number:  [id number]  Date Initiated:  08/15/2013  Documentation initiated by:  Elmer Bales  Subjective/Objective Assessment:   Patient admitted for lumbar laminectomy. Lives at home with husband     Action/Plan:   Will follow for discharge needs.   Anticipated DC Date:  08/15/2013   Anticipated DC Plan:  HOME W HOME HEALTH SERVICES      DC Planning Services  CM consult      Choice offered to / List presented to:     DME arranged  WALKER - Lavone Nian      DME agency  [facility]     HH arranged  HH-2 PT  HH-3 OT      [facility] agency  [facility]   Status of service:  Completed, signed off Medicare Important Message given?   (If response is "NO", the following Medicare IM given date fields will be blank) Date Medicare IM given:   Date Additional Medicare IM given:    Discharge Disposition:  HOME W HOME HEALTH SERVICES  Per UR Regulation:  Reviewed for med. necessity/level of care/duration of stay  If discussed at Long Length of Stay Meetings, dates discussed:    Comments:  08/16/13 17:24 CM called [facility] to notify of discharge.  No other CM needs were communicated.  Freddy Jaksch, BSN, [place] [phone].  08/15/13 1420 Elmer Bales RN, MSN, CM- Met with patient and husband to discuss home health needs.  Patient has chosen to use [facility] for PT/OT. Mary with [facility] was notified and accepted the referral.  Patient is requesting the rolling walker that was recommended.  Order was already in the system. Anticipate discharge home tomorrow.

## 2013-08-15 NOTE — Evaluation (Signed)
Occupational Therapy Evaluation Patient Details Name: Katherine Larson MRN: [id number] DOB: [birth year] Today's Date: 08/15/2013 Time: 4098-1191 OT Time Calculation (min): 29 min  OT Assessment / Plan / Recommendation History of present illness 67 yo female s/p PLIF L3-4 with brace   Clinical Impression   Patient is s/p PLIF L3-4 surgery resulting in functional limitations due to the deficits listed below (see OT problem list).  Patient will benefit from skilled OT acutely to increase independence and safety with ADLS to allow discharge HHOT.     OT Assessment  Patient needs continued OT Services    Follow Up Recommendations  Home health OT;Supervision - Intermittent    Barriers to Discharge      Equipment Recommendations  Other (comment) (RW maybe she wants to ask her husband first)    Recommendations for Other Services    Frequency  Min 2X/week    Precautions / Restrictions Precautions Precautions: Back Precaution Comments: handout provided Required Braces or Orthoses: Spinal Brace Spinal Brace: Lumbar corset;Applied in sitting position   Pertinent Vitals/Pain Minimal pain not rated    ADL  Eating/Feeding: Modified independent Where Assessed - Eating/Feeding: Chair Grooming: Denture care;Teeth care;Wash/dry hands;Wash/dry face;Supervision/safety Where Assessed - Grooming: Unsupported standing Upper Body Bathing: Chest;Left arm;Right arm;Abdomen;Min guard Where Assessed - Upper Body Bathing: Unsupported sitting Lower Body Bathing: +1 Total assistance Where Assessed - Lower Body Bathing: Supported sit to stand Upper Body Dressing: Minimal assistance (don brace) Where Assessed - Upper Body Dressing: Unsupported sitting Lower Body Dressing: +1 Total assistance Where Assessed - Lower Body Dressing: Supported sit to stand (unable to cross bil LE) Toilet Transfer: Minimal assistance Toilet Transfer Method: Sit to stand Toilet Transfer Equipment: Raised toilet seat with  arms (or 3-in-1 over toilet) Tub/Shower Transfer: Min guard Tub/Shower Transfer Method: Science writer: Walk in shower Equipment Used: Gait belt;Back brace;Rolling walker Transfers/Ambulation Related to ADLs: Pt ambulated in room at supervision level. Pt educated on safe use and placement of RW ADL Comments: Pt educated on back precautions with handout. Pt educated on don brace by placing brace over head due to difficulty with internal rotation at shoulder. Pt able to apply brace using this method. pt will have (A) of husband at home. Pt requesting hhot at this time. Pt verbalized sequence for don doff brace but with fair return demo . Pt could benefit from reinforcement with more repetition. Pt completed oral care at sink level using cups for rinsing and prevent bending. Pt with son arriving at end of session. Son educated on back handout and encouraged to practice information with patient. Pt should shower with family member present once allowed .    OT Diagnosis: Generalized weakness;Acute pain  OT Problem List: Decreased strength;Decreased activity tolerance;Impaired balance (sitting and/or standing);Decreased knowledge of use of DME or AE;Decreased safety awareness;Cardiopulmonary status limiting activity;Pain OT Treatment Interventions: Self-care/ADL training;Therapeutic exercise;Manual therapy;Therapeutic activities;Patient/family education;Balance training   OT Goals(Current goals can be found in the care plan section) Acute Rehab OT Goals Patient Stated Goal: to go home soon OT Goal Formulation: With patient Time For Goal Achievement: 08/29/13 Potential to Achieve Goals: Good  Visit Information  Last OT Received On: 08/15/13 Assistance Needed: +1 History of Present Illness: 67 yo female s/p PLIF L3-4 with brace       Prior Functioning     Home Living Family/patient expects to be discharged to:: Private residence Living Arrangements: Spouse/significant  other;Children Available Help at Discharge: Family;Available PRN/intermittently Type of Home: House Home Access: Stairs to  enter Entrance Stairs-Number of Steps: 2 Entrance Stairs-Rails: None Home Layout: One level Home Equipment: Toilet riser;Walker - 2 wheels Prior Function Level of Independence: Independent Communication Communication: No difficulties Dominant Hand: Right         Vision/Perception Vision - History Baseline Vision: No visual deficits Patient Visual Report: No change from baseline   Cognition  Cognition Arousal/Alertness: Awake/alert Behavior During Therapy: WFL for tasks assessed/performed Overall Cognitive Status: Within Functional Limits for tasks assessed    Extremity/Trunk Assessment Upper Extremity Assessment Upper Extremity Assessment: RUE deficits/detail;LUE deficits/detail RUE Deficits / Details: rotator cuff injury with AROM ~70 degrees, pt uses neck flexion to reach hair LUE Deficits / Details: AROM ~90 degrees difficulty with internal rotation to don brace Lower Extremity Assessment Lower Extremity Assessment: Defer to PT evaluation Cervical / Trunk Assessment Cervical / Trunk Assessment: Kyphotic     Mobility Bed Mobility Bed Mobility: Not assessed Transfers Transfers: Sit to Stand;Stand to Sit Sit to Stand: 4: Min assist;With upper extremity assist;From bed Stand to Sit: 4: Min guard;With upper extremity assist;To chair/3-in-1 Details for Transfer Assistance: cues for hand placement and avoid pulling on RW     Exercise     Balance     End of Session OT - End of Session Activity Tolerance: Patient tolerated treatment well Patient left: in chair;with call bell/phone within reach;with family/visitor present  GO     Boone Master B 08/15/2013, 10:32 AM Pager: [phone]

## 2013-08-16 MED ORDER — METHOCARBAMOL 500 MG PO TABS: 500.0000 mg | ORAL_TABLET | Freq: Four times a day (QID) | ORAL | Status: DC | PRN

## 2013-08-16 MED ORDER — OXYCODONE-ACETAMINOPHEN 5-325 MG PO TABS: 1.0000 | ORAL_TABLET | ORAL | Status: DC | PRN

## 2013-08-16 NOTE — Plan of Care (Signed)
Problem: Acute Rehab PT Goals(only PT should resolve) Goal: Pt Will Go Sit To Supine/Side Outcome: Not Met (add Reason) Requires min assist sit to supine for LE's Goal: Patient Will Transfer Sit To/From Stand Outcome: Adequate for Discharge Husband able to assist sit to/from stand Goal: Pt Will Go Up/Down Stairs Outcome: Adequate for Discharge Husband able to provide mod assist for up/down steps Goal: Pt Will Verbalize and Adhere to Precautions While PT Will Verbalize and Adhere to Precautions While Performing Mobility  Outcome: Not Met (add Reason) Able to recall 2/3 back precautions without cueing; able to recall 3rd with cueing

## 2013-08-16 NOTE — Progress Notes (Signed)
Patient D/Cthis morning, assessments remaining as at now. Awaiting on her equipment. Education, paperwork and prescription given. All questions answered.

## 2013-08-16 NOTE — Progress Notes (Signed)
Occupational Therapy Treatment Patient Details Name: Katherine Larson MRN: [id number] DOB: 1946-09-13 Today's Date: 08/16/2013 Time: 1240-1311 OT Time Calculation (min): 31 min  OT Assessment / Plan / Recommendation  History of present illness 68 yo female s/p PLIF L3-4 with brace   OT comments  Practiced bed mobility, donning/doffing brace, toileting, and grooming. OT provided education to patient and spoke with family too. Pt planning to d/c home with husband/family available to assist. Recommending HHOT.   Follow Up Recommendations  Home health OT;Supervision/Assistance - 24 hour    Barriers to Discharge       Equipment Recommendations  Other (comment) (RW delivered to room during session; AE-toilet aid)    Recommendations for Other Services    Frequency Min 2X/week   Progress towards OT Goals Progress towards OT goals: Progressing toward goals  Plan Discharge plan needs to be updated    Precautions / Restrictions Precautions Precautions: Back Precaution Comments: Pt able to state 3/3 back precautions. Required Braces or Orthoses: Spinal Brace Spinal Brace: Lumbar corset;Applied in sitting position Restrictions Weight Bearing Restrictions: No   Pertinent Vitals/Pain Pain 3/10 in back. Repositioned.    ADL  Grooming: Performed;Wash/dry hands;Brushing hair;Minimal assistance;Supervision/safety;Min guard;Set up (see ADL comments) Where Assessed - Grooming: Unsupported standing;Supported standing;Other (comment) (also sitting on 3 in 1) Upper Body Dressing: Minimal assistance (back brace) Where Assessed - Upper Body Dressing: Unsupported sitting Toilet Transfer: Minimal assistance;Min guard Toilet Transfer Method: Sit to stand (Min guard-stand to sit with cues to control descent) Toilet Transfer Equipment: Comfort height toilet;Grab bars Toileting - Clothing Manipulation and Hygiene: Moderate assistance Where Assessed - Toileting Clothing Manipulation and Hygiene: Sit on  3-in-1 or toilet;Sit to stand from 3-in-1 or toilet (hygiene-sitting and clothing-sit to stand) Equipment Used: Gait belt;Back brace;Rolling walker Transfers/Ambulation Related to ADLs: Min guard for ambulation. Min A/Min guard for transfers. ADL Comments: Pt practiced bed mobility. Pt requiring assistance to stand. Educated that AE was available for LB ADLs but pt states husband is going to be helping her-husband agreeable. Educated on safe shoes and using bag on walker for safety. Educated use of two cups for teeth care and putting items on side of dominant hand for grooming to avoid breaking precautions. Pt ambulated to bathroom and OT assisted with clothing as she was breaking precautions and also with hygiene. Pt wiping sitting down, but pt states she felt like she was bending. OT assisted with rest of hygiene with her standing and educated on toilet aid to assist with this at home. Pt brushed hair sitting on 3 in 1-supervision/setup and then brushed hair at sink-OT helped position patient to square her up with mirror to help keep back straight (Min A) and also washed hands standing at sink (supported and unsupported) at Min guard/supervision level.    OT Diagnosis:    OT Problem List:   OT Treatment Interventions:     OT Goals(current goals can now be found in the care plan section) Acute Rehab OT Goals Patient Stated Goal: pt ready to go home-waiting on walker OT Goal Formulation: With patient Time For Goal Achievement: 08/29/13 Potential to Achieve Goals: Good ADL Goals Pt Will Perform Upper Body Bathing: with modified independence;sitting (don doff brace) Pt Will Perform Lower Body Bathing: with modified independence;sit to/from stand;with adaptive equipment Additional ADL Goal #1: Pt will perform bed mobility MOD I with HOB 15 degrees or less  Visit Information  Last OT Received On: 08/16/13 Assistance Needed: +1 History of Present Illness: 67 yo female  s/p PLIF L3-4 with brace     Subjective Data      Prior Functioning       Cognition  Cognition Arousal/Alertness: Awake/alert Behavior During Therapy: WFL for tasks assessed/performed Overall Cognitive Status: Within Functional Limits for tasks assessed    Mobility  Bed Mobility Bed Mobility: Rolling Left;Left Sidelying to Sit;Sitting - Scoot to [place] of Bed;Sit to Sidelying Left;Rolling Right Rolling Right: 4: Min assist Rolling Left: 5: Supervision Left Sidelying to Sit: 4: Min assist Sitting - Scoot to Edge of Bed: 5: Supervision Sit to Sidelying Left: 4: Min assist Details for Bed Mobility Assistance: A to raise leg onto bed and also A to raise trunk from sidelying position. Min A to keep legs and trunk in alignment when rolling onto back Transfers Transfers: Sit to Stand;Stand to Sit Sit to Stand: 4: Min assist;With upper extremity assist;From bed;From toilet Stand to Sit: 4: Min guard;With upper extremity assist;To toilet;To chair/3-in-1;To bed Details for Transfer Assistance: husband able to assist sit to stand as needed- when standing from the bed towards beginning of session husband assisted and said he helped a lot-OT also helped minimally. When OT helped for the rest of session-Min A level. Cues to control descent when sitting.     Exercises         End of Session OT - End of Session Equipment Utilized During Treatment: Rolling walker;Back brace;Gait belt Activity Tolerance: Patient tolerated treatment well Patient left: in chair;with family/visitor present Nurse Communication: Other (comment) (ready for d/c after our session)  GO     Earlie Raveling OTR/L [phone] 08/16/2013, 2:19 PM

## 2013-08-16 NOTE — Discharge Summary (Signed)
Physician Discharge Summary  Patient ID: Katherine Larson MRN: [id number] DOB/AGE: 1946/01/04 67 y.o.  Admit date: 08/14/2013 Discharge date: 08/16/2013  Admission Diagnoses: Lumbar spondylosis with radiculopathy L3-L4, lumbar stenosis  Discharge Diagnoses: Lumbar spondylosis with radiculopathy L3-L4, lumbar stenosis  Active Problems:   * No active hospital problems. *   Discharged Condition: good  Hospital Course: A shunt was admitted to undergo surgical decompression and arthrodesis at L3-L4. She tolerated her surgery well. Initial wound drainage from the Hemovac was large but this subsequently decreased and the drain is removed.  Consults: None  Significant Diagnostic Studies: None  Treatments: surgery: Decompression L3-L4 with decompression of L3 and L4 nerve roots individually posterior lumbar interbody arthrodesis L3-L4 posterior lateral arthrodesis with local autograft and allograft L3-L4 pedicle screw fixation L3-L4  Discharge Exam: Blood pressure 151/72, pulse 81, temperature 97.9 F (36.6 C), temperature source Oral, resp. rate 18, height 5\' 5"  (1.651 m), weight 72.576 kg (160 lb), SpO2 96.00%. Incision is clean and dry. Motor function is good in lower extremities with modest weakness in quadriceps bilaterally. Absent patella reflexes.  Disposition: Discharge home  Discharge Orders   Future Appointments Provider Department Dept Phone   12/10/2013 10:00 AM Krista Blue [facility] MEDICAL ONCOLOGY [phone]   12/10/2013 10:30 AM Si Gaul, MD [facility] MEDICAL ONCOLOGY [phone]   Future Orders Complete By Expires   Call MD for:  redness, tenderness, or signs of infection (pain, swelling, redness, odor or green/yellow discharge around incision site)  As directed    Call MD for:  severe uncontrolled pain  As directed    Call MD for:  temperature >100.4  As directed    Diet - low sodium heart healthy  As directed    Increase  activity slowly  As directed        Medication List         amitriptyline 10 MG tablet  Commonly known as:  ELAVIL  Take 20 mg by mouth daily.     atorvastatin 40 MG tablet  Commonly known as:  LIPITOR  Take 40 mg by mouth daily.     dextromethorphan-guaiFENesin 30-600 MG per 12 hr tablet  Commonly known as:  MUCINEX DM  Take 1 tablet by mouth every 12 (twelve) hours.     fenofibrate 145 MG tablet  Commonly known as:  TRICOR  Take 145 mg by mouth daily.     fexofenadine 180 MG tablet  Commonly known as:  ALLEGRA  Take 180 mg by mouth daily.     Fish Oil 1000 MG Caps  Take 1 capsule by mouth 3 (three) times daily.     losartan-hydrochlorothiazide 100-25 MG per tablet  Commonly known as:  HYZAAR  Take 1 tablet by mouth daily.     metFORMIN 1000 MG tablet  Commonly known as:  GLUCOPHAGE  Take 2,000 mg by mouth 2 (two) times daily with a meal.     methocarbamol 500 MG tablet  Commonly known as:  ROBAXIN  Take 1 tablet (500 mg total) by mouth every 6 (six) hours as needed (spasm).     metoCLOPramide 5 MG tablet  Commonly known as:  REGLAN  Take 5 mg by mouth 2 (two) times daily.     metoprolol succinate 100 MG 24 hr tablet  Commonly known as:  TOPROL-XL  Take 100 mg by mouth daily. Take with or immediately following a meal.     niacin 1000 MG CR tablet  Commonly  known as:  NIASPAN  Take 2,000 mg by mouth at bedtime.     nitroGLYCERIN 0.4 MG SL tablet  Commonly known as:  NITROSTAT  Place 0.4 mg under the tongue every 5 (five) minutes as needed for chest pain.     oxyCODONE-acetaminophen 5-325 MG per tablet  Commonly known as:  PERCOCET/ROXICET  Take 1-2 tablets by mouth every 4 (four) hours as needed.     oxyCODONE-acetaminophen 5-325 MG per tablet  Commonly known as:  PERCOCET/ROXICET  Take 1-2 tablets by mouth every 6 (six) hours as needed for pain.     pioglitazone 45 MG tablet  Commonly known as:  ACTOS  Take 45 mg by mouth daily.     Rivaroxaban  20 MG Tabs tablet  Commonly known as:  XARELTO  Take 1 tablet (20 mg total) by mouth daily.         SignedStefani Dama 08/16/2013, 10:08 AM

## 2013-08-16 NOTE — Progress Notes (Signed)
Physical Therapy Treatment Patient Details Name: Katherine Larson MRN: [id number] DOB: [birth year] Today's Date: 08/16/2013 Time: 4696-2952 PT Time Calculation (min): 22 min  PT Assessment / Plan / Recommendation  History of Present Illness 67 yo female s/p PLIF L3-4 with brace   PT Comments   Patient continues to improve with mobility.  Note patient for d/c today. Patient does require assistance for bed mobility, sit to/from stand and for up/down steps.  Husband assisted with all mobility today and appears safe to assist.  Recommend patient have continued supervision/assistance at home until more independent.  Recommend HHPT to continue to progress mobility and achieve independence.    Follow Up Recommendations  Home health PT;Supervision/Assistance - 24 hour     Does the patient have the potential to tolerate intense rehabilitation     Barriers to Discharge        Equipment Recommendations  Rolling walker with 5" wheels;3in1 (PT)    Recommendations for Other Services    Frequency Min 5X/week   Progress towards PT Goals Progress towards PT goals: Progressing toward goals  Plan Current plan remains appropriate    Precautions / Restrictions Precautions Precautions: Back Precaution Comments: able to recall 2/3 today Required Braces or Orthoses: Spinal Brace Spinal Brace: Lumbar corset;Applied in sitting position Restrictions Weight Bearing Restrictions: No   Pertinent Vitals/Pain 2/10 pain    Mobility  Bed Mobility Bed Mobility: Rolling Left;Rolling Right;Left Sidelying to Sit;Sit to Sidelying Left Rolling Right: 6: Modified independent (Device/Increase time) Rolling Left: 6: Modified independent (Device/Increase time) Left Sidelying to Sit: 6: Modified independent (Device/Increase time) Sit to Sidelying Left: 4: Min assist Details for Bed Mobility Assistance: required assistance for raise LE to bed Transfers Transfers: Sit to Stand;Stand to Sit Sit to Stand: 4: Min  assist;With upper extremity assist Stand to Sit: 5: Supervision;With upper extremity assist Details for Transfer Assistance: husband able to assist sit to stand as needed Ambulation/Gait Ambulation/Gait Assistance: 5: Supervision Ambulation Distance (Feet): 100 Feet Assistive device: Rolling walker Gait Pattern: Step-through pattern Stairs: Yes Stairs Assistance: 3: Mod assist Stairs Assistance Details (indicate cue type and reason): husband provided hand-held assist up/down one 3" step.  Patient unable to step up 6" step.  Reports her steps at home are 3" steps.  Stair Management Technique: Step to pattern;Other (comment) Number of Stairs: 1    Exercises     PT Diagnosis:    PT Problem List:   PT Treatment Interventions:     PT Goals (current goals can now be found in the care plan section)    Visit Information  Last PT Received On: 08/16/13 Assistance Needed: +1 History of Present Illness: 67 yo female s/p PLIF L3-4 with brace    Subjective Data      Cognition  Cognition Arousal/Alertness: Awake/alert Overall Cognitive Status: Within Functional Limits for tasks assessed    Balance  Balance Balance Assessed: No  End of Session PT - End of Session Equipment Utilized During Treatment: Back brace Activity Tolerance: Patient tolerated treatment well Patient left: with call bell/phone within reach;with family/visitor present;in bed Nurse Communication: Mobility status;Other (comment) (equipment needs for d/c)   GP     Olivia Canter, [place] [phone] 08/16/2013, 11:32 AM

## 2013-08-18 LAB — GLUCOSE, CAPILLARY: Glucose-Capillary: 148 mg/dL — ABNORMAL HIGH (ref 70–99)

## 2013-09-09 ENCOUNTER — Other Ambulatory Visit: Payer: Self-pay | Admitting: Family Medicine

## 2013-09-09 DIAGNOSIS — R918 Other nonspecific abnormal finding of lung field: Secondary | ICD-10-CM

## 2013-09-11 ENCOUNTER — Other Ambulatory Visit: Payer: Medicare Other

## 2013-09-22 ENCOUNTER — Other Ambulatory Visit: Payer: Self-pay

## 2013-09-22 MED ORDER — METOPROLOL SUCCINATE ER 100 MG PO TB24: 100.0000 mg | ORAL_TABLET | Freq: Every day | ORAL | Status: DC

## 2013-09-22 NOTE — Telephone Encounter (Signed)
Refill authorization for Metoprolol  e[facility]scribed to [facility]

## 2013-09-22 NOTE — Telephone Encounter (Signed)
Refill authorization for Metoprolol Succ ER 100 mg po e-scribed to [facility] [place] [id number]

## 2013-09-25 ENCOUNTER — Other Ambulatory Visit: Payer: Self-pay

## 2013-10-08 ENCOUNTER — Ambulatory Visit
Admission: RE | Admit: 2013-10-08 | Discharge: 2013-10-08 | Disposition: A | Discharge status: Home / Self Care | Payer: Medicare Other | Source: Ambulatory Visit | Attending: Family Medicine | Admitting: Family Medicine

## 2013-10-08 DIAGNOSIS — R918 Other nonspecific abnormal finding of lung field: Secondary | ICD-10-CM

## 2013-10-17 ENCOUNTER — Telehealth: Payer: Self-pay | Admitting: *Deleted

## 2013-10-17 NOTE — Telephone Encounter (Signed)
Lab work (CBC, iron study, CMET) from Dr [name] office at [facility] dated 10/15/13 given to Dr Donnald Garre to review. SLJ

## 2013-12-10 ENCOUNTER — Encounter: Payer: Self-pay | Admitting: Internal Medicine

## 2013-12-10 ENCOUNTER — Other Ambulatory Visit: Payer: Self-pay | Admitting: Medical Oncology

## 2013-12-10 ENCOUNTER — Ambulatory Visit (HOSPITAL_BASED_OUTPATIENT_CLINIC_OR_DEPARTMENT_OTHER): Payer: Medicare Other | Admitting: Internal Medicine

## 2013-12-10 ENCOUNTER — Other Ambulatory Visit (HOSPITAL_BASED_OUTPATIENT_CLINIC_OR_DEPARTMENT_OTHER): Payer: Medicare Other

## 2013-12-10 ENCOUNTER — Telehealth: Payer: Self-pay | Admitting: Internal Medicine

## 2013-12-10 VITALS — BP 120/61 | HR 75 | Temp 98.0°F | Resp 18 | Ht 65.0 in | Wt 157.8 lb

## 2013-12-10 DIAGNOSIS — D649 Anemia, unspecified: Secondary | ICD-10-CM

## 2013-12-10 DIAGNOSIS — E119 Type 2 diabetes mellitus without complications: Secondary | ICD-10-CM

## 2013-12-10 DIAGNOSIS — R5383 Other fatigue: Secondary | ICD-10-CM

## 2013-12-10 DIAGNOSIS — R5381 Other malaise: Secondary | ICD-10-CM

## 2013-12-10 DIAGNOSIS — I2510 Atherosclerotic heart disease of native coronary artery without angina pectoris: Secondary | ICD-10-CM

## 2013-12-10 DIAGNOSIS — D72829 Elevated white blood cell count, unspecified: Secondary | ICD-10-CM

## 2013-12-10 DIAGNOSIS — I10 Essential (primary) hypertension: Secondary | ICD-10-CM

## 2013-12-10 LAB — COMPREHENSIVE METABOLIC PANEL (CC13)
ALT: 15 U/L (ref 0–55)
AST: 17 U/L (ref 5–34)
Albumin: 3.5 g/dL (ref 3.5–5.0)
Alkaline Phosphatase: 36 U/L — ABNORMAL LOW (ref 40–150)
Anion Gap: 9 mEq/L (ref 3–11)
BUN: 33.8 mg/dL — ABNORMAL HIGH (ref 7.0–26.0)
CO2: 23 mEq/L (ref 22–29)
Calcium: 9.8 mg/dL (ref 8.4–10.4)
Chloride: 104 mEq/L (ref 98–109)
Creatinine: 0.9 mg/dL (ref 0.6–1.1)
Glucose: 89 mg/dl (ref 70–140)
Potassium: 4.8 mEq/L (ref 3.5–5.1)
Sodium: 136 mEq/L (ref 136–145)
Total Bilirubin: 0.37 mg/dL (ref 0.20–1.20)
Total Protein: 6.9 g/dL (ref 6.4–8.3)

## 2013-12-10 LAB — LACTATE DEHYDROGENASE (CC13): LDH: 151 U/L (ref 125–245)

## 2013-12-10 LAB — CBC WITH DIFFERENTIAL/PLATELET
BASO%: 0.2 % (ref 0.0–2.0)
Basophils Absolute: 0 10*3/uL (ref 0.0–0.1)
EOS%: 1.9 % (ref 0.0–7.0)
Eosinophils Absolute: 0.3 10*3/uL (ref 0.0–0.5)
HCT: 32.4 % — ABNORMAL LOW (ref 34.8–46.6)
HGB: 10.2 g/dL — ABNORMAL LOW (ref 11.6–15.9)
LYMPH%: 37.2 % (ref 14.0–49.7)
MCH: 28.7 pg (ref 25.1–34.0)
MCHC: 31.5 g/dL (ref 31.5–36.0)
MCV: 91.3 fL (ref 79.5–101.0)
MONO#: 0.9 10*3/uL (ref 0.1–0.9)
MONO%: 6.6 % (ref 0.0–14.0)
NEUT#: 7.3 10*3/uL — ABNORMAL HIGH (ref 1.5–6.5)
NEUT%: 54.1 % (ref 38.4–76.8)
Platelets: 373 10*3/uL (ref 145–400)
RBC: 3.55 10*6/uL — ABNORMAL LOW (ref 3.70–5.45)
RDW: 16.7 % — ABNORMAL HIGH (ref 11.2–14.5)
WBC: 13.5 10*3/uL — ABNORMAL HIGH (ref 3.9–10.3)
lymph#: 5 10*3/uL — ABNORMAL HIGH (ref 0.9–3.3)

## 2013-12-10 LAB — IRON AND TIBC CHCC
%SAT: 16 % — ABNORMAL LOW (ref 21–57)
Iron: 83 ug/dL (ref 41–142)
TIBC: 513 ug/dL — ABNORMAL HIGH (ref 236–444)
UIBC: 430 ug/dL — ABNORMAL HIGH (ref 120–384)

## 2013-12-10 LAB — FERRITIN CHCC: Ferritin: 167 ng/ml (ref 9–269)

## 2013-12-10 NOTE — Patient Instructions (Signed)
Followup visit in 6 months with repeat CBC, iron study and ferritin.

## 2013-12-10 NOTE — Progress Notes (Signed)
[place] [phone]   Fax:([phone]) [place], [place] [address]  DIAGNOSIS:  #1 reactive leukocytosis.  #2 anemia of chronic disease plus/minus iron deficiency.   PRIOR THERAPY: None   CURRENT THERAPY:Nu-Iron 150 mg by mouth daily.   INTERVAL HISTORY: Katherine Larson 68 y.o. female returns to the clinic today for followup visit accompanied by her husband. The patient is feeling fine today with no specific complaints except for fatigue. She denied having any bleeding issues, bruises or ecchymosis She denied having any weight loss or night sweats. She has no palpable lymphadenopathy. The patient has repeat CBC and iron study performed earlier today and she is here for evaluation and discussion of her lab results.  MEDICAL HISTORY: Past Medical History  Diagnosis Date  . NSVT (nonsustained ventricular tachycardia)   . DM2 (diabetes mellitus, type 2)   . CAD (coronary artery disease)     s/p CABG 1996;  Myoview 3/11: mod partially reversible AS perfusion defect, most likely related to variable breast attenuation although an element of scar with peri-infarct ischemia also possible, EF 54% - felt to be low risk  . HTN (hypertension)   . HLD (hyperlipidemia)   . Asthma   . Neuropathy   . Complication of anesthesia   . PONV (postoperative nausea and vomiting)   . Shortness of breath     with activity  . Seasonal allergies   . GERD (gastroesophageal reflux disease)   . Constipation   . Diarrhea     ALLERGIES:  is allergic to biaxin and penicillins.  MEDICATIONS:  Current Outpatient Prescriptions  Medication Sig Dispense Refill  . amitriptyline (ELAVIL) 10 MG tablet Take 20 mg by mouth daily.       Marland Kitchen atorvastatin (LIPITOR) 40 MG tablet Take 40 mg by mouth daily.      Marland Kitchen dextromethorphan-guaiFENesin (MUCINEX DM) 30-600 MG per 12 hr tablet Take 1 tablet by mouth every 12  (twelve) hours.      . fenofibrate (TRICOR) 145 MG tablet Take 145 mg by mouth daily.        . fexofenadine (ALLEGRA) 180 MG tablet Take 180 mg by mouth daily.        Marland Kitchen losartan-hydrochlorothiazide (HYZAAR) 100-25 MG per tablet Take 1 tablet by mouth daily.        . metFORMIN (GLUCOPHAGE) 1000 MG tablet Take 2,000 mg by mouth 2 (two) times daily with a meal.        . metoCLOPramide (REGLAN) 5 MG tablet Take 5 mg by mouth 2 (two) times daily.       . metoprolol succinate (TOPROL-XL) 100 MG 24 hr tablet Take 1 tablet (100 mg total) by mouth daily. Take with or immediately following a meal.  30 tablet  6  . niacin (NIASPAN) 1000 MG CR tablet Take 2,000 mg by mouth at bedtime.        . Omega-3 Fatty Acids (FISH OIL) 1000 MG CAPS Take 1 capsule by mouth 3 (three) times daily.        . pioglitazone (ACTOS) 45 MG tablet Take 45 mg by mouth daily.        . Rivaroxaban (XARELTO) 20 MG TABS tablet Take 1 tablet (20 mg total) by mouth daily.  30 tablet  3  . nitroGLYCERIN (NITROSTAT) 0.4 MG SL tablet Place 0.4 mg under the tongue every 5 (five) minutes as needed for  chest pain.       No current facility-administered medications for this visit.    SURGICAL HISTORY:  Past Surgical History  Procedure Laterality Date  . Cholecystectomy    . Vesicovaginal fistula closure w/ tah    . Ptca    . Coronary artery bypass graft  1996    2 vessels  . Hernia repair      hital hernia  . Eye surgery Bilateral   . Abdominal hysterectomy    . Fracture surgery Right     arm  . Maximum access (mas)posterior lumbar interbody fusion (plif) 1 level N/A 08/14/2013    Procedure:  MAXIMUM ACCESS SURGERY(MAS) POSTERIOR LUMBAR INTERBODY FUSION LUMBAR THREE-FOUR ;  Surgeon: Eustace Moore, MD;  Location: [facility];  Service: Neurosurgery;  Laterality: N/A;   MAXIMUM ACCESS SURGERY(MAS) POSTERIOR LUMBAR INTERBODY FUSION LUMBAR THREE-FOUR     REVIEW OF SYSTEMS:  A comprehensive review of systems was negative except for:  Constitutional: positive for fatigue   PHYSICAL EXAMINATION: General appearance: alert, cooperative, fatigued and no distress Head: Normocephalic, without obvious abnormality, atraumatic Neck: no adenopathy Lymph nodes: Cervical, supraclavicular, and axillary nodes normal. Resp: clear to auscultation bilaterally Cardio: regular rate and rhythm, S1, S2 normal, no murmur, click, rub or gallop GI: soft, non-tender; bowel sounds normal; no masses,  no organomegaly Extremities: extremities normal, atraumatic, no cyanosis or edema  ECOG PERFORMANCE STATUS: 1 - Symptomatic but completely ambulatory  Blood pressure 120/61, pulse 75, temperature 98 F (36.7 C), temperature source Oral, resp. rate 18, height 5\' 5"  (1.651 m), weight 157 lb 12.8 oz (71.578 kg), SpO2 100.00%.  LABORATORY DATA: Lab Results  Component Value Date   WBC 13.5* 12/10/2013   HGB 10.2* 12/10/2013   HCT 32.4* 12/10/2013   MCV 91.3 12/10/2013   PLT 373 12/10/2013      Chemistry      Component Value Date/Time   NA 136 12/10/2013 1004   NA 133* 08/11/2013 1451   K 4.8 12/10/2013 1004   K 4.0 08/11/2013 1451   CL 99 08/11/2013 1451   CL 102 12/17/2012 0953   CO2 23 12/10/2013 1004   CO2 21 08/11/2013 1451   BUN 33.8* 12/10/2013 1004   BUN 9 08/11/2013 1451   CREATININE 0.9 12/10/2013 1004   CREATININE 0.49* 08/11/2013 1451   CREATININE 0.44* 07/16/2013 1228      Component Value Date/Time   CALCIUM 9.8 12/10/2013 1004   CALCIUM 8.7 08/11/2013 1451   ALKPHOS 36* 12/10/2013 1004   ALKPHOS 35* 08/24/2008 1510   AST 17 12/10/2013 1004   AST 23 08/24/2008 1510   ALT 15 12/10/2013 1004   ALT 18 08/24/2008 1510   BILITOT 0.37 12/10/2013 1004   BILITOT 0.7 08/24/2008 1510     Other studies: Ferritin 167, serum iron 83, total iron binding capacity 513 and iron saturation 16%.  RADIOGRAPHIC STUDIES:  ASSESSMENT AND PLAN: This is a very pleasant 68 years old white female with anemia of chronic disease plus/minus deficiency currently on  Nu-iron and tolerating it fairly well. Her hemoglobin and hematocrit are within the acceptable range today. I recommended for the patient to continue her current treatment with Nu-Iron. I would see her back for followup visit in 6 months with repeat CBC, comprehensive metabolic panel, LDH as well as iron study and ferritin. The patient was advised to call immediately she has any concerning symptoms in the interval.  All questions were answered. The patient knows to call the clinic  with any problems, questions or concerns. We can certainly see the patient much sooner if necessary.  Disclaimer: This note was dictated with voice recognition software. Similar sounding words can inadvertently be transcribed and may not be corrected upon review.

## 2013-12-10 NOTE — Telephone Encounter (Signed)
s.w. pt and advised on July appt....pt ok and aware °

## 2013-12-22 ENCOUNTER — Telehealth: Payer: Self-pay | Admitting: *Deleted

## 2013-12-22 NOTE — Telephone Encounter (Signed)
Office note from Dr Trudie Reed at [facility] dated 12/22/13 given to Dr Vista Mink to review.  SLJ

## 2014-02-27 ENCOUNTER — Other Ambulatory Visit: Payer: Self-pay | Admitting: *Deleted

## 2014-02-27 DIAGNOSIS — Z951 Presence of aortocoronary bypass graft: Secondary | ICD-10-CM

## 2014-02-27 DIAGNOSIS — Z01810 Encounter for preprocedural cardiovascular examination: Secondary | ICD-10-CM

## 2014-02-27 DIAGNOSIS — R0602 Shortness of breath: Secondary | ICD-10-CM

## 2014-02-27 DIAGNOSIS — I4892 Unspecified atrial flutter: Secondary | ICD-10-CM

## 2014-02-27 DIAGNOSIS — I2510 Atherosclerotic heart disease of native coronary artery without angina pectoris: Secondary | ICD-10-CM

## 2014-02-27 DIAGNOSIS — E119 Type 2 diabetes mellitus without complications: Secondary | ICD-10-CM

## 2014-02-27 MED ORDER — RIVAROXABAN 20 MG PO TABS: 20.0000 mg | ORAL_TABLET | Freq: Every day | ORAL | Status: DC

## 2014-04-19 ENCOUNTER — Other Ambulatory Visit: Payer: Self-pay | Admitting: Cardiovascular Disease

## 2014-06-09 ENCOUNTER — Ambulatory Visit (HOSPITAL_BASED_OUTPATIENT_CLINIC_OR_DEPARTMENT_OTHER): Payer: Medicare Other | Admitting: Internal Medicine

## 2014-06-09 ENCOUNTER — Encounter: Payer: Self-pay | Admitting: Internal Medicine

## 2014-06-09 ENCOUNTER — Telehealth: Payer: Self-pay | Admitting: Internal Medicine

## 2014-06-09 ENCOUNTER — Other Ambulatory Visit (HOSPITAL_BASED_OUTPATIENT_CLINIC_OR_DEPARTMENT_OTHER): Payer: Medicare Other

## 2014-06-09 VITALS — BP 136/43 | HR 73 | Temp 98.5°F | Resp 18 | Ht 65.0 in | Wt 183.8 lb

## 2014-06-09 DIAGNOSIS — D509 Iron deficiency anemia, unspecified: Secondary | ICD-10-CM

## 2014-06-09 DIAGNOSIS — D638 Anemia in other chronic diseases classified elsewhere: Secondary | ICD-10-CM

## 2014-06-09 DIAGNOSIS — D72829 Elevated white blood cell count, unspecified: Secondary | ICD-10-CM

## 2014-06-09 DIAGNOSIS — D649 Anemia, unspecified: Secondary | ICD-10-CM

## 2014-06-09 LAB — CBC WITH DIFFERENTIAL/PLATELET
BASO%: 0.6 % (ref 0.0–2.0)
Basophils Absolute: 0 10*3/uL (ref 0.0–0.1)
EOS%: 1.9 % (ref 0.0–7.0)
Eosinophils Absolute: 0.1 10*3/uL (ref 0.0–0.5)
HCT: 30.2 % — ABNORMAL LOW (ref 34.8–46.6)
HGB: 9.8 g/dL — ABNORMAL LOW (ref 11.6–15.9)
LYMPH%: 27.2 % (ref 14.0–49.7)
MCH: 33.4 pg (ref 25.1–34.0)
MCHC: 32.4 g/dL (ref 31.5–36.0)
MCV: 103.1 fL — ABNORMAL HIGH (ref 79.5–101.0)
MONO#: 0.3 10*3/uL (ref 0.1–0.9)
MONO%: 4.6 % (ref 0.0–14.0)
NEUT#: 4.7 10*3/uL (ref 1.5–6.5)
NEUT%: 65.7 % (ref 38.4–76.8)
Platelets: 262 10*3/uL (ref 145–400)
RBC: 2.93 10*6/uL — ABNORMAL LOW (ref 3.70–5.45)
RDW: 15 % — ABNORMAL HIGH (ref 11.2–14.5)
WBC: 7.2 10*3/uL (ref 3.9–10.3)
lymph#: 2 10*3/uL (ref 0.9–3.3)

## 2014-06-09 LAB — FERRITIN CHCC: Ferritin: 136 ng/ml (ref 9–269)

## 2014-06-09 LAB — IRON AND TIBC CHCC
%SAT: 17 % — ABNORMAL LOW (ref 21–57)
Iron: 85 ug/dL (ref 41–142)
TIBC: 506 ug/dL — ABNORMAL HIGH (ref 236–444)
UIBC: 422 ug/dL — ABNORMAL HIGH (ref 120–384)

## 2014-06-09 NOTE — Progress Notes (Signed)
[place] [phone]   Fax:([phone]) [place], [place] [phone]  DIAGNOSIS:  #1 reactive leukocytosis.  #2 anemia of chronic disease plus/minus iron deficiency.   PRIOR THERAPY: None   CURRENT THERAPY:Nu-Iron 150 mg by mouth daily.   INTERVAL HISTORY: Katherine Larson 68 y.o. female returns to the clinic today for followup visit accompanied by her husband. The patient is feeling fine today with no specific complaints except for fatigue. She is tolerating her treatment with oral iron tablet fairly well with no significant adverse effects. She denied having any bleeding issues, bruises or ecchymosis She denied having any weight loss or night sweats. She has no palpable lymphadenopathy. The patient has repeat CBC and iron study performed earlier today and she is here for evaluation and discussion of her lab results.  MEDICAL HISTORY: Past Medical History  Diagnosis Date  . NSVT (nonsustained ventricular tachycardia)   . DM2 (diabetes mellitus, type 2)   . CAD (coronary artery disease)     s/p CABG 1996;  Myoview 3/11: mod partially reversible AS perfusion defect, most likely related to variable breast attenuation although an element of scar with peri-infarct ischemia also possible, EF 54% - felt to be low risk  . HTN (hypertension)   . HLD (hyperlipidemia)   . Asthma   . Neuropathy   . Complication of anesthesia   . PONV (postoperative nausea and vomiting)   . Shortness of breath     with activity  . Seasonal allergies   . GERD (gastroesophageal reflux disease)   . Constipation   . Diarrhea     ALLERGIES:  is allergic to biaxin and penicillins.  MEDICATIONS:  Current Outpatient Prescriptions  Medication Sig Dispense Refill  . amitriptyline (ELAVIL) 10 MG tablet Take 20 mg by mouth daily.       Marland Kitchen atorvastatin (LIPITOR) 40 MG tablet Take 40 mg by mouth daily.      Marland Kitchen  dextromethorphan-guaiFENesin (MUCINEX DM) 30-600 MG per 12 hr tablet Take 1 tablet by mouth every 12 (twelve) hours.      . fenofibrate (TRICOR) 145 MG tablet Take 145 mg by mouth daily.        . fexofenadine (ALLEGRA) 180 MG tablet Take 180 mg by mouth daily.        Marland Kitchen FOLIC ACID PO Take 3 capsules by mouth daily.      Marland Kitchen losartan-hydrochlorothiazide (HYZAAR) 100-25 MG per tablet Take 1 tablet by mouth daily.        . metFORMIN (GLUCOPHAGE) 1000 MG tablet Take 2,000 mg by mouth 2 (two) times daily with a meal.        . metoCLOPramide (REGLAN) 5 MG tablet Take 5 mg by mouth 2 (two) times daily.       . metoprolol succinate (TOPROL-XL) 100 MG 24 hr tablet TAKE 1 TABLET BY MOUTH EVERY DAY  30 tablet  6  . niacin (NIASPAN) 1000 MG CR tablet Take 2,000 mg by mouth at bedtime.        . nitroGLYCERIN (NITROSTAT) 0.4 MG SL tablet Place 0.4 mg under the tongue every 5 (five) minutes as needed for chest pain.      . Omega-3 Fatty Acids (FISH OIL) 1000 MG CAPS Take 1 capsule by mouth 3 (three) times daily.        . pioglitazone (ACTOS) 45 MG tablet Take 45 mg by mouth daily.        Marland Kitchen  Rivaroxaban (XARELTO) 20 MG TABS tablet Take 1 tablet (20 mg total) by mouth daily.  30 tablet  3   No current facility-administered medications for this visit.    SURGICAL HISTORY:  Past Surgical History  Procedure Laterality Date  . Cholecystectomy    . Vesicovaginal fistula closure w/ tah    . Ptca    . Coronary artery bypass graft  1996    2 vessels  . Hernia repair      hital hernia  . Eye surgery Bilateral   . Abdominal hysterectomy    . Fracture surgery Right     arm  . Maximum access (mas)posterior lumbar interbody fusion (plif) 1 level N/A 08/14/2013    Procedure:  MAXIMUM ACCESS SURGERY(MAS) POSTERIOR LUMBAR INTERBODY FUSION LUMBAR THREE-FOUR ;  Surgeon: Eustace Moore, MD;  Location: [facility];  Service: Neurosurgery;  Laterality: N/A;   MAXIMUM ACCESS SURGERY(MAS) POSTERIOR LUMBAR INTERBODY FUSION LUMBAR  THREE-FOUR     REVIEW OF SYSTEMS:  A comprehensive review of systems was negative except for: Constitutional: positive for fatigue   PHYSICAL EXAMINATION: General appearance: alert, cooperative, fatigued and no distress Head: Normocephalic, without obvious abnormality, atraumatic Neck: no adenopathy Lymph nodes: Cervical, supraclavicular, and axillary nodes normal. Resp: clear to auscultation bilaterally Cardio: regular rate and rhythm, S1, S2 normal, no murmur, click, rub or gallop GI: soft, non-tender; bowel sounds normal; no masses,  no organomegaly Extremities: extremities normal, atraumatic, no cyanosis or edema  ECOG PERFORMANCE STATUS: 1 - Symptomatic but completely ambulatory  Blood pressure 136/43, pulse 73, temperature 98.5 F (36.9 C), temperature source Oral, resp. rate 18, height $RemoveBe'5\' 5"'jprLEdYEI$  (1.651 m), weight 183 lb 12.8 oz (83.371 kg), SpO2 99.00%.  LABORATORY DATA: Lab Results  Component Value Date   WBC 7.2 06/09/2014   HGB 9.8* 06/09/2014   HCT 30.2* 06/09/2014   MCV 103.1* 06/09/2014   PLT 262 06/09/2014      Chemistry      Component Value Date/Time   NA 136 12/10/2013 1004   NA 133* 08/11/2013 1451   K 4.8 12/10/2013 1004   K 4.0 08/11/2013 1451   CL 99 08/11/2013 1451   CL 102 12/17/2012 0953   CO2 23 12/10/2013 1004   CO2 21 08/11/2013 1451   BUN 33.8* 12/10/2013 1004   BUN 9 08/11/2013 1451   CREATININE 0.9 12/10/2013 1004   CREATININE 0.49* 08/11/2013 1451   CREATININE 0.44* 07/16/2013 1228      Component Value Date/Time   CALCIUM 9.8 12/10/2013 1004   CALCIUM 8.7 08/11/2013 1451   ALKPHOS 36* 12/10/2013 1004   ALKPHOS 35* 08/24/2008 1510   AST 17 12/10/2013 1004   AST 23 08/24/2008 1510   ALT 15 12/10/2013 1004   ALT 18 08/24/2008 1510   BILITOT 0.37 12/10/2013 1004   BILITOT 0.7 08/24/2008 1510     Other studies: Ferritin 167, serum iron 83, total iron binding capacity 513 and iron saturation 16%.  RADIOGRAPHIC STUDIES:  ASSESSMENT AND PLAN: This is a very  pleasant 68 years old white female with anemia of chronic disease plus/minus deficiency currently on Nu-iron and tolerating it fairly well.  I recommended for the patient to continue her current treatment with Nu-Iron. I would see her back for followup visit in 6 months with repeat CBC, comprehensive metabolic panel, LDH as well as iron study and ferritin. The patient was advised to call immediately she has any concerning symptoms in the interval. She continues to have mild anemia with  hemoglobin down to 9.8. Her iron study is unremarkable today. I recommended for the patient to consider a bone marrow biopsy and aspirate for further evaluation of her anemia. I would see her back for followup visit in one month's for reevaluation and discussion of her biopsy results. She was advised to call immediately if she has any concerning symptoms in the interval. All questions were answered. The patient knows to call the clinic with any problems, questions or concerns. We can certainly see the patient much sooner if necessary.  Disclaimer: This note was dictated with voice recognition software. Similar sounding words can inadvertently be transcribed and may not be corrected upon review.

## 2014-06-09 NOTE — Telephone Encounter (Signed)
Pt confirmed labs/ov per 07/21 POF, gave pt AVS...KJ °

## 2014-06-15 ENCOUNTER — Telehealth: Payer: Self-pay | Admitting: Internal Medicine

## 2014-06-15 NOTE — Telephone Encounter (Signed)
New message     Patient need to stop blood thinner prior to exam -    Procedure  8/3.   Can put information into epic.

## 2014-06-15 NOTE — Telephone Encounter (Signed)
Katherine Antis, MD wanting to hold Xarelto 24 hours prior and restart the following day. Will get approval and let her know

## 2014-06-16 ENCOUNTER — Encounter (HOSPITAL_COMMUNITY): Payer: Self-pay | Admitting: Pharmacy Technician

## 2014-06-17 ENCOUNTER — Other Ambulatory Visit: Payer: Self-pay | Admitting: Radiology

## 2014-06-18 ENCOUNTER — Other Ambulatory Visit: Payer: Self-pay | Admitting: Radiology

## 2014-06-18 ENCOUNTER — Other Ambulatory Visit: Payer: Self-pay | Admitting: Cardiology

## 2014-06-18 ENCOUNTER — Telehealth: Payer: Self-pay | Admitting: Medical Oncology

## 2014-06-18 NOTE — Telephone Encounter (Signed)
Discussed with Dr Rayann Heman, ok to hold Xarelto 24 hours prior to procedure and resume as indicated by MD doing procedure after.

## 2014-06-18 NOTE — Telephone Encounter (Signed)
Pt has questions about meds and prep for Bm asp and biopsy. I left voice message at short stay to call pt.

## 2014-06-22 ENCOUNTER — Ambulatory Visit (HOSPITAL_COMMUNITY)
Admission: RE | Admit: 2014-06-22 | Discharge: 2014-06-22 | Disposition: A | Discharge status: Home / Self Care | Payer: Medicare Other | Source: Ambulatory Visit | Attending: Internal Medicine | Admitting: Internal Medicine

## 2014-06-22 ENCOUNTER — Encounter (HOSPITAL_COMMUNITY): Payer: Self-pay

## 2014-06-22 DIAGNOSIS — D539 Nutritional anemia, unspecified: Secondary | ICD-10-CM | POA: Insufficient documentation

## 2014-06-22 DIAGNOSIS — E1149 Type 2 diabetes mellitus with other diabetic neurological complication: Secondary | ICD-10-CM | POA: Insufficient documentation

## 2014-06-22 DIAGNOSIS — I2510 Atherosclerotic heart disease of native coronary artery without angina pectoris: Secondary | ICD-10-CM | POA: Diagnosis not present

## 2014-06-22 DIAGNOSIS — D704 Cyclic neutropenia: Secondary | ICD-10-CM | POA: Insufficient documentation

## 2014-06-22 DIAGNOSIS — I10 Essential (primary) hypertension: Secondary | ICD-10-CM | POA: Insufficient documentation

## 2014-06-22 DIAGNOSIS — D638 Anemia in other chronic diseases classified elsewhere: Secondary | ICD-10-CM | POA: Insufficient documentation

## 2014-06-22 DIAGNOSIS — I472 Ventricular tachycardia, unspecified: Secondary | ICD-10-CM | POA: Insufficient documentation

## 2014-06-22 DIAGNOSIS — J45909 Unspecified asthma, uncomplicated: Secondary | ICD-10-CM | POA: Insufficient documentation

## 2014-06-22 DIAGNOSIS — Z87891 Personal history of nicotine dependence: Secondary | ICD-10-CM | POA: Diagnosis not present

## 2014-06-22 DIAGNOSIS — K219 Gastro-esophageal reflux disease without esophagitis: Secondary | ICD-10-CM | POA: Insufficient documentation

## 2014-06-22 DIAGNOSIS — I4729 Other ventricular tachycardia: Secondary | ICD-10-CM | POA: Insufficient documentation

## 2014-06-22 DIAGNOSIS — E1142 Type 2 diabetes mellitus with diabetic polyneuropathy: Secondary | ICD-10-CM | POA: Insufficient documentation

## 2014-06-22 DIAGNOSIS — Z79899 Other long term (current) drug therapy: Secondary | ICD-10-CM | POA: Insufficient documentation

## 2014-06-22 DIAGNOSIS — E785 Hyperlipidemia, unspecified: Secondary | ICD-10-CM | POA: Diagnosis not present

## 2014-06-22 DIAGNOSIS — Z951 Presence of aortocoronary bypass graft: Secondary | ICD-10-CM | POA: Diagnosis not present

## 2014-06-22 DIAGNOSIS — Z9089 Acquired absence of other organs: Secondary | ICD-10-CM | POA: Insufficient documentation

## 2014-06-22 DIAGNOSIS — D649 Anemia, unspecified: Secondary | ICD-10-CM

## 2014-06-22 LAB — CBC
HCT: 27.5 % — ABNORMAL LOW (ref 36.0–46.0)
Hemoglobin: 9 g/dL — ABNORMAL LOW (ref 12.0–15.0)
MCH: 34 pg (ref 26.0–34.0)
MCHC: 32.7 g/dL (ref 30.0–36.0)
MCV: 103.8 fL — ABNORMAL HIGH (ref 78.0–100.0)
Platelets: 254 10*3/uL (ref 150–400)
RBC: 2.65 MIL/uL — ABNORMAL LOW (ref 3.87–5.11)
RDW: 15.1 % (ref 11.5–15.5)
WBC: 6.3 10*3/uL (ref 4.0–10.5)

## 2014-06-22 LAB — GLUCOSE, CAPILLARY: Glucose-Capillary: 106 mg/dL — ABNORMAL HIGH (ref 70–99)

## 2014-06-22 LAB — PROTIME-INR
INR: 0.99 (ref 0.00–1.49)
Prothrombin Time: 13.1 seconds (ref 11.6–15.2)

## 2014-06-22 LAB — BONE MARROW EXAM

## 2014-06-22 LAB — APTT: aPTT: 27 seconds (ref 24–37)

## 2014-06-22 MED ORDER — FENTANYL CITRATE 0.05 MG/ML IJ SOLN
INTRAMUSCULAR | Status: AC
  Filled 2014-06-22: qty 6

## 2014-06-22 MED ORDER — MIDAZOLAM HCL 2 MG/2ML IJ SOLN
INTRAMUSCULAR | Status: AC
  Filled 2014-06-22: qty 6

## 2014-06-22 MED ORDER — SODIUM CHLORIDE 0.9 % IV SOLN
INTRAVENOUS | Status: DC
  Administered 2014-06-22: 08:00:00 via INTRAVENOUS

## 2014-06-22 MED ORDER — FENTANYL CITRATE 0.05 MG/ML IJ SOLN
INTRAMUSCULAR | Status: AC | PRN
  Administered 2014-06-22 (×2): 25 ug via INTRAVENOUS
  Administered 2014-06-22: 50 ug via INTRAVENOUS

## 2014-06-22 MED ORDER — MIDAZOLAM HCL 2 MG/2ML IJ SOLN
INTRAMUSCULAR | Status: AC | PRN
  Administered 2014-06-22 (×2): 0.5 mg via INTRAVENOUS
  Administered 2014-06-22: 1 mg via INTRAVENOUS

## 2014-06-22 NOTE — H&P (Signed)
Agree.  Patient seen.  For CT guided bone marrow biopsy today.

## 2014-06-22 NOTE — H&P (Signed)
Chief Complaint: "I'm here for a bone marrow biopsy" Referring Physician:Mohamed HPI: Katherine Larson is an 68 y.o. female patient being worked up for anemia of chronic disease. She is referred for bone marrow biopsy. PMHx, meds, labs reviewed. She did hold her Xarelto for the past 24hrs.  Past Medical History:  Past Medical History  Diagnosis Date  . NSVT (nonsustained ventricular tachycardia)   . DM2 (diabetes mellitus, type 2)   . CAD (coronary artery disease)     s/p CABG 1996;  Myoview 3/11: mod partially reversible AS perfusion defect, most likely related to variable breast attenuation although an element of scar with peri-infarct ischemia also possible, EF 54% - felt to be low risk  . HTN (hypertension)   . HLD (hyperlipidemia)   . Asthma   . Neuropathy   . Complication of anesthesia   . PONV (postoperative nausea and vomiting)   . Shortness of breath     with activity  . Seasonal allergies   . GERD (gastroesophageal reflux disease)   . Constipation   . Diarrhea   . Arthritis     RA    Past Surgical History:  Past Surgical History  Procedure Laterality Date  . Cholecystectomy    . Vesicovaginal fistula closure w/ tah    . Ptca    . Coronary artery bypass graft  1996    2 vessels  . Hernia repair      hital hernia  . Eye surgery Bilateral   . Abdominal hysterectomy    . Fracture surgery Right     arm  . Maximum access (mas)posterior lumbar interbody fusion (plif) 1 level N/A 08/14/2013    Procedure:  MAXIMUM ACCESS SURGERY(MAS) POSTERIOR LUMBAR INTERBODY FUSION LUMBAR THREE-FOUR ;  Surgeon: Tia Alert, MD;  Location: [facility];  Service: Neurosurgery;  Laterality: N/A;   MAXIMUM ACCESS SURGERY(MAS) POSTERIOR LUMBAR INTERBODY FUSION LUMBAR THREE-FOUR     Family History:  Family History  Problem Relation Age of Onset  . Heart attack Father   . Heart attack Mother     Social History:  reports that she quit smoking about 19 years ago. She does not have any  smokeless tobacco history on file. She reports that she does not drink alcohol or use illicit drugs.  Allergies:  Allergies  Allergen Reactions  . Biaxin [Clarithromycin] Rash and Other (See Comments)    Blisters in mouth   . Penicillins Rash and Other (See Comments)    Blisters in mouth     Medications:   Medication List    ASK your doctor about these medications       allopurinol 300 MG tablet  Commonly known as:  ZYLOPRIM  Take 300 mg by mouth daily.     amitriptyline 10 MG tablet  Commonly known as:  ELAVIL  Take 20 mg by mouth at bedtime.     atorvastatin 80 MG tablet  Commonly known as:  LIPITOR  Take 80 mg by mouth daily.     DERMEND BRUISE FORMULA EX  Apply 1 application topically daily as needed (Bruises).     dextromethorphan-guaiFENesin 30-600 MG per 12 hr tablet  Commonly known as:  MUCINEX DM  Take 1 tablet by mouth 2 (two) times daily as needed for cough.     fenofibrate 160 MG tablet  Take 160 mg by mouth at bedtime.     fexofenadine 180 MG tablet  Commonly known as:  ALLEGRA  Take 180 mg by mouth daily.  Fish Oil 1000 MG Caps  Take 3,000 mg by mouth daily.     folic acid 1 MG tablet  Commonly known as:  FOLVITE  Take 3 mg by mouth daily.     furosemide 20 MG tablet  Commonly known as:  LASIX  Take 20 mg by mouth daily.     iron polysaccharides 150 MG capsule  Commonly known as:  NIFEREX  Take 150 mg by mouth daily.     losartan-hydrochlorothiazide 100-25 MG per tablet  Commonly known as:  HYZAAR  Take 1 tablet by mouth every morning.     MACULAR VITAMIN BENEFIT PO  Take 2 tablets by mouth daily.     meloxicam 15 MG tablet  Commonly known as:  MOBIC  Take 15 mg by mouth daily.     metFORMIN 1000 MG tablet  Commonly known as:  GLUCOPHAGE  Take 1,000 mg by mouth 2 (two) times daily with a meal.     methotrexate 2.5 MG tablet  Take 20 mg by mouth once a week. Wednesday     metoCLOPramide 5 MG tablet  Commonly known as:   REGLAN  Take 5 mg by mouth at bedtime.     metoprolol succinate 50 MG 24 hr tablet  Commonly known as:  TOPROL-XL  Take 50 mg by mouth every morning. Take with or immediately following a meal.     montelukast 10 MG tablet  Commonly known as:  SINGULAIR  Take 10 mg by mouth at bedtime.     NITROSTAT 0.4 MG SL tablet  Generic drug:  nitroGLYCERIN  PLACE 1 TABLET (0.4 MG TOTAL) UNDER THE TONGUE EVERY 5 (FIVE) MINUTES AS NEEDED.     nitroGLYCERIN 0.4 MG SL tablet  Commonly known as:  NITROSTAT  Place 0.4 mg under the tongue every 5 (five) minutes as needed for chest pain.     pantoprazole 40 MG tablet  Commonly known as:  PROTONIX  Take 40 mg by mouth 2 (two) times daily.     pioglitazone 15 MG tablet  Commonly known as:  ACTOS  Take 15 mg by mouth daily.     predniSONE 5 MG tablet  Commonly known as:  DELTASONE  Take 2.5-5 mg by mouth daily. Alternates between 2.5mg  and 5 mg daily.     rivaroxaban 20 MG Tabs tablet  Commonly known as:  XARELTO  Take 1 tablet (20 mg total) by mouth daily.     SYSTANE OP  Apply 1 drop to eye 3 (three) times daily as needed (Dry Eyes).        Please HPI for pertinent positives, otherwise complete 10 system ROS negative.  Physical Exam: BP 139/71  Pulse 85  Temp(Src) 98.2 F (36.8 C) (Oral)  Resp 16  Ht 5\' 5"  (1.651 m)  Wt 183 lb (83.008 kg)  BMI 30.45 kg/m2  SpO2 99% Body mass index is 30.45 kg/(m^2).   General Appearance:  Alert, cooperative, no distress, appears stated age  Head:  Normocephalic, without obvious abnormality, atraumatic  ENT: Unremarkable  Neck: Supple, symmetrical, trachea midline  Lungs:   Clear to auscultation bilaterally, no w/r/r, respirations unlabored without use of accessory muscles.  Chest Wall:  No tenderness or deformity  Heart:  Regular rate and rhythm, S1, S2 normal, no murmur, rub or gallop.  Neurologic: Normal affect, no gross deficits.   Results for orders placed during the hospital encounter  of 06/22/14 (from the past 48 hour(s))  APTT     Status: None  Collection Time    06/22/14  7:55 AM      Result Value Ref Range   aPTT 27  24 - 37 seconds  CBC     Status: Abnormal   Collection Time    06/22/14  7:55 AM      Result Value Ref Range   WBC 6.3  4.0 - 10.5 K/uL   RBC 2.65 (*) 3.87 - 5.11 MIL/uL   Hemoglobin 9.0 (*) 12.0 - 15.0 g/dL   HCT 27.5 (*) 36.0 - 46.0 %   MCV 103.8 (*) 78.0 - 100.0 fL   MCH 34.0  26.0 - 34.0 pg   MCHC 32.7  30.0 - 36.0 g/dL   RDW 15.1  11.5 - 15.5 %   Platelets 254  150 - 400 K/uL  PROTIME-INR     Status: None   Collection Time    06/22/14  7:55 AM      Result Value Ref Range   Prothrombin Time 13.1  11.6 - 15.2 seconds   INR 0.99  0.00 - 1.49   No results found.  Assessment/Plan Anemia of chronic disease. For CT guided bone marrow biopsy Discussed procedure, risks, complications, use of sedation Labs reviewed. Consent signed in chart  Ascencion Dike PA-C 06/22/2014, 8:29 AM

## 2014-06-22 NOTE — Discharge Instructions (Signed)
Bone Marrow Aspiration, Bone Marrow Biopsy °Care After °Read the instructions outlined below and refer to this sheet in the next few weeks. These discharge instructions provide you with general information on caring for yourself after you leave the hospital. Your caregiver may also give you specific instructions. While your treatment has been planned according to the most current medical practices available, unavoidable complications occasionally occur. If you have any problems or questions after discharge, call your caregiver. °FINDING OUT THE RESULTS OF YOUR TEST °Not all test results are available during your visit. If your test results are not back during the visit, make an appointment with your caregiver to find out the results. Do not assume everything is normal if you have not heard from your caregiver or the medical facility. It is important for you to follow up on all of your test results.  °HOME CARE INSTRUCTIONS  °You have had sedation and may be sleepy or dizzy. Your thinking may not be as clear as usual. For the next 24 hours: °· Only take over-the-counter or prescription medicines for pain, discomfort, and or fever as directed by your caregiver. °· Do not drink alcohol. °· Do not smoke. °· Do not drive. °· Do not make important legal decisions. °· Do not operate heavy machinery. °· Do not care for small children by yourself. °· Keep your dressing clean and dry. You may replace dressing with a bandage after 24 hours. °· You may take a bath or shower after 24 hours. °· Use an ice pack for 20 minutes every 2 hours while awake for pain as needed. °SEEK MEDICAL CARE IF:  °· There is redness, swelling, or increasing pain at the biopsy site. °· There is pus coming from the biopsy site. °· There is drainage from a biopsy site lasting longer than one day. °· An unexplained oral temperature above 102° F (38.9° C) develops. °SEEK IMMEDIATE MEDICAL CARE IF:  °· You develop a rash. °· You have difficulty  breathing. °· You develop any reaction or side effects to medications given. °Document Released: 05/26/2005 Document Revised: 01/29/2012 Document Reviewed: 11/03/2008 °ExitCare® Patient Information ©2015 [employer]. This information is not intended to replace advice given to you by your health care provider. Make sure you discuss any questions you have with your health care provider. °Conscious Sedation °Sedation is the use of medicines to promote relaxation and relieve discomfort and anxiety. Conscious sedation is a type of sedation. Under conscious sedation you are less alert than normal but are still able to respond to instructions or stimulation. Conscious sedation is used during short medical and dental procedures. It is milder than deep sedation or general anesthesia and allows you to return to your regular activities sooner.  °LET YOUR HEALTH CARE PROVIDER KNOW ABOUT:  °· Any allergies you have. °· All medicines you are taking, including vitamins, herbs, eye drops, creams, and over-the-counter medicines. °· Use of steroids (by mouth or creams). °· Previous problems you or members of your family have had with the use of anesthetics. °· Any blood disorders you have. °· Previous surgeries you have had. °· Medical conditions you have. °· Possibility of pregnancy, if this applies. °· Use of cigarettes, alcohol, or illegal drugs. °RISKS AND COMPLICATIONS °Generally, this is a safe procedure. However, as with any procedure, problems can occur. Possible problems include: °· Oversedation. °· Trouble breathing on your own. You may need to have a breathing tube until you are awake and breathing on your own. °· Allergic reaction   to any of the medicines used for the procedure. °BEFORE THE PROCEDURE °· You may have blood tests done. These tests can help show how well your kidneys and liver are working. They can also show how well your blood clots. °· A physical exam will be done.   °· Only take medicines as directed by  your health care provider. You may need to stop taking medicines (such as blood thinners, aspirin, or nonsteroidal anti-inflammatory drugs) before the procedure.   °· Do not eat or drink at least 6 hours before the procedure or as directed by your health care provider. °· Arrange for a responsible adult, family member, or friend to take you home after the procedure. He or she should stay with you for at least 24 hours after the procedure, until the medicine has worn off. °PROCEDURE  °· An intravenous (IV) catheter will be inserted into one of your veins. Medicine will be able to flow directly into your body through this catheter. You may be given medicine through this tube to help prevent pain and help you relax. °· The medical or dental procedure will be done. °AFTER THE PROCEDURE °· You will stay in a recovery area until the medicine has worn off. Your blood pressure and pulse will be checked.   °·  Depending on the procedure you had, you may be allowed to go home when you can tolerate liquids and your pain is under control. °Document Released: 08/01/2001 Document Revised: 11/11/2013 Document Reviewed: 07/14/2013 °ExitCare® Patient Information ©2015 [employer]. This information is not intended to replace advice given to you by your health care provider. Make sure you discuss any questions you have with your health care provider. ° °

## 2014-06-22 NOTE — Procedures (Signed)
Procedure:  CT guided bone marrow biopsy Findings:  Aspirate and core biopsy of right iliac bone marrow obtained. No complications.

## 2014-06-23 LAB — GLUCOSE, CAPILLARY: Glucose-Capillary: 37 mg/dL — CL (ref 70–99)

## 2014-06-29 ENCOUNTER — Telehealth: Payer: Self-pay | Admitting: Medical Oncology

## 2014-06-29 NOTE — Telephone Encounter (Signed)
Asking for bone marrow results. I told her to call back Thursday . Note to [place].

## 2014-07-01 LAB — CHROMOSOME ANALYSIS, BONE MARROW

## 2014-07-01 LAB — TISSUE HYBRIDIZATION (BONE MARROW)-NCBH

## 2014-07-03 ENCOUNTER — Telehealth: Payer: Self-pay | Admitting: Medical Oncology

## 2014-07-03 NOTE — Telephone Encounter (Signed)
Message copied by Ardeen Garland on Fri Jul 03, 2014 11:59 AM ------      Message from: Curt Bears      Created: Wed Jul 01, 2014  5:37 PM       Will discuss next visit.       ----- Message -----         From: Ardeen Garland, RN         Sent: 06/29/2014  12:31 PM           To: Curt Bears, MD            Asking for bone marrow results. Next ov 8/24       ------

## 2014-07-03 NOTE — Telephone Encounter (Signed)
Message copied by Ardeen Garland on Fri Jul 03, 2014 11:17 AM ------      Message from: Curt Bears      Created: Wed Jul 01, 2014  5:37 PM       Will discuss next visit.       ----- Message -----         From: Ardeen Garland, RN         Sent: 06/29/2014  12:31 PM           To: Curt Bears, MD            Asking for bone marrow results. Next ov 8/24       ------

## 2014-07-03 NOTE — Telephone Encounter (Signed)
Pt.notified

## 2014-07-13 ENCOUNTER — Encounter (HOSPITAL_COMMUNITY): Payer: Self-pay

## 2014-07-13 ENCOUNTER — Ambulatory Visit (HOSPITAL_BASED_OUTPATIENT_CLINIC_OR_DEPARTMENT_OTHER): Payer: Medicare Other | Admitting: Internal Medicine

## 2014-07-13 ENCOUNTER — Other Ambulatory Visit (HOSPITAL_BASED_OUTPATIENT_CLINIC_OR_DEPARTMENT_OTHER): Payer: Medicare Other

## 2014-07-13 VITALS — BP 128/54 | HR 76 | Temp 98.4°F | Resp 19 | Ht 65.0 in | Wt 190.3 lb

## 2014-07-13 DIAGNOSIS — D638 Anemia in other chronic diseases classified elsewhere: Secondary | ICD-10-CM

## 2014-07-13 DIAGNOSIS — D649 Anemia, unspecified: Secondary | ICD-10-CM

## 2014-07-13 LAB — CBC WITH DIFFERENTIAL/PLATELET
BASO%: 0.3 % (ref 0.0–2.0)
Basophils Absolute: 0 10*3/uL (ref 0.0–0.1)
EOS%: 2.5 % (ref 0.0–7.0)
Eosinophils Absolute: 0.2 10*3/uL (ref 0.0–0.5)
HCT: 29.3 % — ABNORMAL LOW (ref 34.8–46.6)
HGB: 9.3 g/dL — ABNORMAL LOW (ref 11.6–15.9)
LYMPH%: 20.4 % (ref 14.0–49.7)
MCH: 33.2 pg (ref 25.1–34.0)
MCHC: 31.7 g/dL (ref 31.5–36.0)
MCV: 104.6 fL — ABNORMAL HIGH (ref 79.5–101.0)
MONO#: 0.3 10*3/uL (ref 0.1–0.9)
MONO%: 3.7 % (ref 0.0–14.0)
NEUT#: 6.7 10*3/uL — ABNORMAL HIGH (ref 1.5–6.5)
NEUT%: 73.1 % (ref 38.4–76.8)
Platelets: 245 10*3/uL (ref 145–400)
RBC: 2.8 10*6/uL — ABNORMAL LOW (ref 3.70–5.45)
RDW: 15.6 % — ABNORMAL HIGH (ref 11.2–14.5)
WBC: 9.2 10*3/uL (ref 3.9–10.3)
lymph#: 1.9 10*3/uL (ref 0.9–3.3)

## 2014-07-13 NOTE — Progress Notes (Signed)
[place] Telephone:([phone]   Fax:(336) [place], Katherine Larson  DIAGNOSIS:  #1 reactive leukocytosis. Resloved.  #2 anemia of chronic disease plus/minus iron deficiency.   PRIOR THERAPY: None   CURRENT THERAPY:Nu-Iron 150 mg by mouth daily.   INTERVAL HISTORY: Katherine Larson 68 y.o. female returns to the clinic today for followup visit accompanied by her husband. The patient is feeling fine today with no specific complaints except for fatigue. She recently underwent a bone marrow biopsy and aspirate for evaluation of her persistent anemia and she is here today for evaluation and discussion of her biopsy results. She is tolerating her treatment with oral iron tablet fairly well with no significant adverse effects. She denied having any bleeding issues, bruises or ecchymosis She denied having any weight loss or night sweats. She has no palpable lymphadenopathy.  MEDICAL HISTORY: Past Medical History  Diagnosis Date  . NSVT (nonsustained ventricular tachycardia)   . DM2 (diabetes mellitus, type 2)   . CAD (coronary artery disease)     s/p CABG 1996;  Myoview 3/11: mod partially reversible AS perfusion defect, most likely related to variable breast attenuation although an element of scar with peri-infarct ischemia also possible, EF 54% - felt to be low risk  . HTN (hypertension)   . HLD (hyperlipidemia)   . Asthma   . Neuropathy   . Complication of anesthesia   . PONV (postoperative nausea and vomiting)   . Shortness of breath     with activity  . Seasonal allergies   . GERD (gastroesophageal reflux disease)   . Constipation   . Diarrhea   . Arthritis     RA    ALLERGIES:  is allergic to biaxin and penicillins.  MEDICATIONS:  Current Outpatient Prescriptions  Medication Sig Dispense Refill  . allopurinol (ZYLOPRIM) 300 MG tablet Take 300 mg by mouth daily.        Marland Kitchen amitriptyline (ELAVIL) 10 MG tablet Take 20 mg by mouth at bedtime.       . fenofibrate 160 MG tablet Take 160 mg by mouth at bedtime.      . fexofenadine (ALLEGRA) 180 MG tablet Take 180 mg by mouth daily.        . folic acid (FOLVITE) 1 MG tablet Take 3 mg by mouth daily.      . furosemide (LASIX) 20 MG tablet Take 20 mg by mouth daily.      . iron polysaccharides (NIFEREX) 150 MG capsule Take 150 mg by mouth daily.      Marland Kitchen losartan-hydrochlorothiazide (HYZAAR) 100-25 MG per tablet Take 1 tablet by mouth every morning.       . meloxicam (MOBIC) 15 MG tablet Take 15 mg by mouth daily.      . metFORMIN (GLUCOPHAGE) 1000 MG tablet Take 1,000 mg by mouth 2 (two) times daily with a meal.       . methotrexate 2.5 MG tablet Take 20 mg by mouth once a week. Wednesday      . metoCLOPramide (REGLAN) 5 MG tablet Take 5 mg by mouth at bedtime.       . metoprolol succinate (TOPROL-XL) 50 MG 24 hr tablet Take 50 mg by mouth every morning. Take with or immediately following a meal.      . montelukast (SINGULAIR) 10 MG tablet Take 10 mg by mouth at bedtime.      Marland Kitchen  Multiple Vitamins-Minerals (MACULAR VITAMIN BENEFIT PO) Take 2 tablets by mouth daily.      . nitroGLYCERIN (NITROSTAT) 0.4 MG SL tablet Place 0.4 mg under the tongue every 5 (five) minutes as needed for chest pain.      . Omega-3 Fatty Acids (FISH OIL) 1000 MG CAPS Take 3,000 mg by mouth daily.      . pantoprazole (PROTONIX) 40 MG tablet Take 40 mg by mouth 2 (two) times daily.      . pioglitazone (ACTOS) 15 MG tablet Take 15 mg by mouth daily.      Vladimir Faster Glycol-Propyl Glycol (SYSTANE OP) Apply 1 drop to eye 3 (three) times daily as needed (Dry Eyes).      . predniSONE (DELTASONE) 5 MG tablet Take 2.5-5 mg by mouth daily. Alternates between 2.5mg  and 5 mg daily.      . Rivaroxaban (XARELTO) 20 MG TABS tablet Take 1 tablet (20 mg total) by mouth daily.  30 tablet  3  . atorvastatin (LIPITOR) 80 MG tablet Take 80 mg by mouth daily.      Marland Kitchen  dextromethorphan-guaiFENesin (MUCINEX DM) 30-600 MG per 12 hr tablet Take 1 tablet by mouth 2 (two) times daily as needed for cough.       . Emollient (DERMEND BRUISE FORMULA EX) Apply 1 application topically daily as needed (Bruises).       No current facility-administered medications for this visit.    SURGICAL HISTORY:  Past Surgical History  Procedure Laterality Date  . Cholecystectomy    . Vesicovaginal fistula closure w/ tah    . Ptca    . Coronary artery bypass graft  1996    2 vessels  . Hernia repair      hital hernia  . Eye surgery Bilateral   . Abdominal hysterectomy    . Fracture surgery Right     arm  . Maximum access (mas)posterior lumbar interbody fusion (plif) 1 level N/A 08/14/2013    Procedure:  MAXIMUM ACCESS SURGERY(MAS) POSTERIOR LUMBAR INTERBODY FUSION LUMBAR THREE-FOUR ;  Surgeon: Eustace Moore, MD;  Location: [facility];  Service: Neurosurgery;  Laterality: N/A;   MAXIMUM ACCESS SURGERY(MAS) POSTERIOR LUMBAR INTERBODY FUSION LUMBAR THREE-FOUR     REVIEW OF SYSTEMS:  A comprehensive review of systems was negative except for: Constitutional: positive for fatigue   PHYSICAL EXAMINATION: General appearance: alert, cooperative, fatigued and no distress Head: Normocephalic, without obvious abnormality, atraumatic Neck: no adenopathy Lymph nodes: Cervical, supraclavicular, and axillary nodes normal. Resp: clear to auscultation bilaterally Cardio: regular rate and rhythm, S1, S2 normal, no murmur, click, rub or gallop GI: soft, non-tender; bowel sounds normal; no masses,  no organomegaly Extremities: extremities normal, atraumatic, no cyanosis or edema  ECOG PERFORMANCE STATUS: 1 - Symptomatic but completely ambulatory  Blood pressure 128/54, pulse 76, temperature 98.4 F (36.9 C), temperature source Oral, resp. rate 19, height 5\' 5"  (1.651 m), weight 190 lb 4.8 oz (86.32 kg).  LABORATORY DATA: Lab Results  Component Value Date   WBC 9.2 07/13/2014   HGB  9.3* 07/13/2014   HCT 29.3* 07/13/2014   MCV 104.6* 07/13/2014   PLT 245 07/13/2014      Chemistry      Component Value Date/Time   NA 136 12/10/2013 1004   NA 133* 08/11/2013 1451   K 4.8 12/10/2013 1004   K 4.0 08/11/2013 1451   CL 99 08/11/2013 1451   CL 102 12/17/2012 0953   CO2 23 12/10/2013 1004   CO2  21 08/11/2013 1451   BUN 33.8* 12/10/2013 1004   BUN 9 08/11/2013 1451   CREATININE 0.9 12/10/2013 1004   CREATININE 0.49* 08/11/2013 1451   CREATININE 0.44* 07/16/2013 1228      Component Value Date/Time   CALCIUM 9.8 12/10/2013 1004   CALCIUM 8.7 08/11/2013 1451   ALKPHOS 36* 12/10/2013 1004   ALKPHOS 35* 08/24/2008 1510   AST 17 12/10/2013 1004   AST 23 08/24/2008 1510   ALT 15 12/10/2013 1004   ALT 18 08/24/2008 1510   BILITOT 0.37 12/10/2013 1004   BILITOT 0.7 08/24/2008 1510       BONE MARROW REPORT FINAL DIAGNOSIS Diagnosis Bone Marrow, Aspirate,Biopsy, and Clot, right iliac - SLIGHTLY HYPERCELLULAR BONE MARROW FOR AGE WITH TRILINEAGE HEMATOPOIESIS AND SUBTLE DYSPOIESIS, SEE COMMENT. PERIPHERAL BLOOD: - MACROCYTIC ANEMIA. - NEUTROPHILIC LEFT SHIFT. Diagnosis Note The bone marrow is slightly hypercellular for age with trilineage hematopoiesis. There is subtle dyspoiesis present primarily involving the erythroid and to a lesser extent the granulocytic cell lines with no increase in blastic cells. The differential diagnosis includes non-clonal processes including nutritional deficiency (vitamin B12, folate), medication, alcohol, toxins, infection, etc. as well as clonal myelodysplastic process. The findings are limited and hence correlation with cytogenetic studies is strongly recommended. (BNS:ecj 06/23/2014) Susanne Greenhouse MD Pathologist, Electronic Signature (Case signed 06/23/2014)  RADIOGRAPHIC STUDIES:  ASSESSMENT AND PLAN: This is a very pleasant 68 years old white female with anemia of chronic disease plus/minus deficiency currently on Nu-iron and tolerating it fairly well.   Her recent bone marrow biopsy and aspirate showed no significant abnormalities and the findings were nonspecific including nutritional deficiency, medications, alcohol, toxins and infection as well as clonal myelodysplastic process. The cytogenetics showed no abnormality. I discussed the biopsy results with the patient and her husband. I recommended for her to continue on the iron tablets for now. I would see her back for followup visit in 3 months with repeat CBC, iron study and ferritin. She was advised to call immediately if she has any concerning symptoms in the interval. All questions were answered. The patient knows to call the clinic with any problems, questions or concerns. We can certainly see the patient much sooner if necessary.  Disclaimer: This note was dictated with voice recognition software. Similar sounding words can inadvertently be transcribed and may not be corrected upon review.

## 2014-07-14 ENCOUNTER — Telehealth: Payer: Self-pay | Admitting: Internal Medicine

## 2014-07-14 NOTE — Telephone Encounter (Signed)
s.w. pt and advised on NOV appts.....pt ok and aware... °

## 2014-07-23 ENCOUNTER — Other Ambulatory Visit: Payer: Self-pay | Admitting: Internal Medicine

## 2014-07-24 ENCOUNTER — Other Ambulatory Visit: Payer: Self-pay

## 2014-07-24 DIAGNOSIS — R0602 Shortness of breath: Secondary | ICD-10-CM

## 2014-07-24 DIAGNOSIS — E119 Type 2 diabetes mellitus without complications: Secondary | ICD-10-CM

## 2014-07-24 DIAGNOSIS — Z951 Presence of aortocoronary bypass graft: Secondary | ICD-10-CM

## 2014-07-24 DIAGNOSIS — Z01810 Encounter for preprocedural cardiovascular examination: Secondary | ICD-10-CM

## 2014-07-24 MED ORDER — RIVAROXABAN 20 MG PO TABS: 20.0000 mg | ORAL_TABLET | Freq: Every day | ORAL | Status: DC

## 2014-08-17 ENCOUNTER — Telehealth: Payer: Self-pay | Admitting: Internal Medicine

## 2014-08-17 NOTE — Telephone Encounter (Signed)
Pt states she has not taken Xarelto for 2 weeks because she is in the doughnut hole and it is too expensive.  Pt is asking for a less expensive alternative for Xarelto.  Pt advised I will forward to Dr [name] for review and recommendations.

## 2014-08-17 NOTE — Telephone Encounter (Signed)
New message    The cost of xarelto is too expensive - looking for a gentric brand.

## 2014-08-18 NOTE — Telephone Encounter (Signed)
Spoke with patient and will leave sample out front for patient

## 2014-09-16 ENCOUNTER — Telehealth: Payer: Self-pay | Admitting: Internal Medicine

## 2014-09-16 ENCOUNTER — Other Ambulatory Visit: Payer: Self-pay | Admitting: Family Medicine

## 2014-09-16 DIAGNOSIS — R918 Other nonspecific abnormal finding of lung field: Secondary | ICD-10-CM

## 2014-09-16 NOTE — Telephone Encounter (Signed)
changed time of  11/17 to 1:45pm. s/w pt she is aware. confirmed w/pt new time for 11/17 and also 11/10 appt.

## 2014-09-28 ENCOUNTER — Telehealth: Payer: Self-pay | Admitting: Internal Medicine

## 2014-09-28 NOTE — Telephone Encounter (Signed)
Pt called lft msg to cancel labs for 11/11 due to husband in hospital, I called lft msg for pt r/s labs for 11/13 if she couldn't do this to call to r/s and that we may need to r/s MD visit .Marland Kitchen... KJ

## 2014-09-29 ENCOUNTER — Other Ambulatory Visit: Payer: Medicare Other

## 2014-10-02 ENCOUNTER — Other Ambulatory Visit (HOSPITAL_BASED_OUTPATIENT_CLINIC_OR_DEPARTMENT_OTHER): Payer: Medicare Other

## 2014-10-02 DIAGNOSIS — D649 Anemia, unspecified: Secondary | ICD-10-CM

## 2014-10-02 DIAGNOSIS — D638 Anemia in other chronic diseases classified elsewhere: Secondary | ICD-10-CM

## 2014-10-02 LAB — IRON AND TIBC CHCC
%SAT: 20 % — ABNORMAL LOW (ref 21–57)
Iron: 97 ug/dL (ref 41–142)
TIBC: 491 ug/dL — ABNORMAL HIGH (ref 236–444)
UIBC: 393 ug/dL — ABNORMAL HIGH (ref 120–384)

## 2014-10-02 LAB — CBC WITH DIFFERENTIAL/PLATELET
BASO%: 0.4 % (ref 0.0–2.0)
Basophils Absolute: 0 10*3/uL (ref 0.0–0.1)
EOS%: 4.4 % (ref 0.0–7.0)
Eosinophils Absolute: 0.3 10*3/uL (ref 0.0–0.5)
HCT: 30.2 % — ABNORMAL LOW (ref 34.8–46.6)
HGB: 9.5 g/dL — ABNORMAL LOW (ref 11.6–15.9)
LYMPH%: 25.5 % (ref 14.0–49.7)
MCH: 33.1 pg (ref 25.1–34.0)
MCHC: 31.5 g/dL (ref 31.5–36.0)
MCV: 105.2 fL — ABNORMAL HIGH (ref 79.5–101.0)
MONO#: 0.5 10*3/uL (ref 0.1–0.9)
MONO%: 6.7 % (ref 0.0–14.0)
NEUT#: 4.9 10*3/uL (ref 1.5–6.5)
NEUT%: 63 % (ref 38.4–76.8)
Platelets: 243 10*3/uL (ref 145–400)
RBC: 2.87 10*6/uL — ABNORMAL LOW (ref 3.70–5.45)
RDW: 14.6 % — ABNORMAL HIGH (ref 11.2–14.5)
WBC: 7.8 10*3/uL (ref 3.9–10.3)
lymph#: 2 10*3/uL (ref 0.9–3.3)

## 2014-10-02 LAB — FERRITIN CHCC: Ferritin: 108 ng/ml (ref 9–269)

## 2014-10-06 ENCOUNTER — Telehealth: Payer: Self-pay | Admitting: Internal Medicine

## 2014-10-06 ENCOUNTER — Encounter: Payer: Self-pay | Admitting: Internal Medicine

## 2014-10-06 ENCOUNTER — Ambulatory Visit (HOSPITAL_BASED_OUTPATIENT_CLINIC_OR_DEPARTMENT_OTHER): Payer: Medicare Other | Admitting: Internal Medicine

## 2014-10-06 VITALS — BP 153/82 | HR 86 | Temp 98.7°F | Resp 19 | Ht 65.0 in | Wt 187.7 lb

## 2014-10-06 DIAGNOSIS — D539 Nutritional anemia, unspecified: Secondary | ICD-10-CM

## 2014-10-06 DIAGNOSIS — D638 Anemia in other chronic diseases classified elsewhere: Secondary | ICD-10-CM

## 2014-10-06 NOTE — Progress Notes (Signed)
[place] Telephone:(336) [phone]   Fax:(336) [place], [place] [address]  DIAGNOSIS:  #1 reactive leukocytosis. Resloved.  #2 anemia of chronic disease plus/minus iron deficiency.   PRIOR THERAPY: None   CURRENT THERAPY:Nu-Iron 150 mg by mouth daily.   INTERVAL HISTORY: Katherine Larson 68 y.o. female returns to the clinic today for followup visit accompanied by her husband. The patient is feeling fine today with no specific complaints except for persistent fatigue.she is currently on Nu-Iron and is tolerating her treatment with oral iron tablet fairly well with no significant adverse effects. She denied having any bleeding issues, bruises or ecchymosis She denied having any weight loss or night sweats. She has no palpable lymphadenopathy. She had repeat CBC and iron study performed recently and she is here for evaluation and discussion of her lab results.  MEDICAL HISTORY: Past Medical History  Diagnosis Date  . NSVT (nonsustained ventricular tachycardia)   . DM2 (diabetes mellitus, type 2)   . CAD (coronary artery disease)     s/p CABG 1996;  Myoview 3/11: mod partially reversible AS perfusion defect, most likely related to variable breast attenuation although an element of scar with peri-infarct ischemia also possible, EF 54% - felt to be low risk  . HTN (hypertension)   . HLD (hyperlipidemia)   . Asthma   . Neuropathy   . Complication of anesthesia   . PONV (postoperative nausea and vomiting)   . Shortness of breath     with activity  . Seasonal allergies   . GERD (gastroesophageal reflux disease)   . Constipation   . Diarrhea   . Arthritis     RA    ALLERGIES:  is allergic to biaxin and penicillins.  MEDICATIONS:  Current Outpatient Prescriptions  Medication Sig Dispense Refill  . allopurinol (ZYLOPRIM) 300 MG tablet Take 300 mg by mouth daily.    Marland Kitchen amitriptyline  (ELAVIL) 10 MG tablet Take 20 mg by mouth at bedtime.     Marland Kitchen atorvastatin (LIPITOR) 80 MG tablet Take 80 mg by mouth daily.    Marland Kitchen dextromethorphan-guaiFENesin (MUCINEX DM) 30-600 MG per 12 hr tablet Take 1 tablet by mouth 2 (two) times daily as needed for cough.     . Emollient (DERMEND BRUISE FORMULA EX) Apply 1 application topically daily as needed (Bruises).    . fenofibrate 160 MG tablet Take 160 mg by mouth at bedtime.    . fexofenadine (ALLEGRA) 180 MG tablet Take 180 mg by mouth daily.      . folic acid (FOLVITE) 1 MG tablet Take 3 mg by mouth daily.    . furosemide (LASIX) 20 MG tablet Take 20 mg by mouth daily.    . iron polysaccharides (NIFEREX) 150 MG capsule Take 150 mg by mouth daily.    Marland Kitchen losartan-hydrochlorothiazide (HYZAAR) 100-25 MG per tablet Take 1 tablet by mouth every morning.     . meloxicam (MOBIC) 15 MG tablet Take 15 mg by mouth daily.    . metFORMIN (GLUCOPHAGE) 1000 MG tablet Take 1,000 mg by mouth 2 (two) times daily with a meal.     . methotrexate 2.5 MG tablet Take 20 mg by mouth once a week. Wednesday    . metoCLOPramide (REGLAN) 5 MG tablet Take 5 mg by mouth at bedtime.     . metoprolol succinate (TOPROL-XL) 50 MG 24 hr tablet Take 50 mg by mouth  every morning. Take with or immediately following a meal.    . montelukast (SINGULAIR) 10 MG tablet Take 10 mg by mouth at bedtime.    . Multiple Vitamins-Minerals (MACULAR VITAMIN BENEFIT PO) Take 2 tablets by mouth daily.    . nitroGLYCERIN (NITROSTAT) 0.4 MG SL tablet Place 0.4 mg under the tongue every 5 (five) minutes as needed for chest pain.    . Omega-3 Fatty Acids (FISH OIL) 1000 MG CAPS Take 3,000 mg by mouth daily.    . pantoprazole (PROTONIX) 40 MG tablet Take 40 mg by mouth 2 (two) times daily.    . pioglitazone (ACTOS) 15 MG tablet Take 15 mg by mouth daily.    Vladimir Faster Glycol-Propyl Glycol (SYSTANE OP) Apply 1 drop to eye 3 (three) times daily as needed (Dry Eyes).    . predniSONE (DELTASONE) 5 MG tablet  Take 2.5-5 mg by mouth daily. Alternates between 2.5mg  and 5 mg daily.    . rivaroxaban (XARELTO) 20 MG TABS tablet Take 1 tablet (20 mg total) by mouth daily. 30 tablet 0   No current facility-administered medications for this visit.    SURGICAL HISTORY:  Past Surgical History  Procedure Laterality Date  . Cholecystectomy    . Vesicovaginal fistula closure w/ tah    . Ptca    . Coronary artery bypass graft  1996    2 vessels  . Hernia repair      hital hernia  . Eye surgery Bilateral   . Abdominal hysterectomy    . Fracture surgery Right     arm  . Maximum access (mas)posterior lumbar interbody fusion (plif) 1 level N/A 08/14/2013    Procedure:  MAXIMUM ACCESS SURGERY(MAS) POSTERIOR LUMBAR INTERBODY FUSION LUMBAR THREE-FOUR ;  Surgeon: Eustace Moore, MD;  Location: [facility];  Service: Neurosurgery;  Laterality: N/A;   MAXIMUM ACCESS SURGERY(MAS) POSTERIOR LUMBAR INTERBODY FUSION LUMBAR THREE-FOUR     REVIEW OF SYSTEMS:  A comprehensive review of systems was negative except for: Constitutional: positive for fatigue   PHYSICAL EXAMINATION: General appearance: alert, cooperative, fatigued and no distress Head: Normocephalic, without obvious abnormality, atraumatic Neck: no adenopathy Lymph nodes: Cervical, supraclavicular, and axillary nodes normal. Resp: clear to auscultation bilaterally Cardio: regular rate and rhythm, S1, S2 normal, no murmur, click, rub or gallop GI: soft, non-tender; bowel sounds normal; no masses,  no organomegaly Extremities: extremities normal, atraumatic, no cyanosis or edema  ECOG PERFORMANCE STATUS: 1 - Symptomatic but completely ambulatory  Blood pressure 153/82, pulse 86, temperature 98.7 F (37.1 C), temperature source Oral, resp. rate 19, height 5\' 5"  (1.651 m), weight 187 lb 11.2 oz (85.14 kg), SpO2 97 %.  LABORATORY DATA: Lab Results  Component Value Date   WBC 7.8 10/02/2014   HGB 9.5* 10/02/2014   HCT 30.2* 10/02/2014   MCV 105.2*  10/02/2014   PLT 243 10/02/2014      Chemistry      Component Value Date/Time   NA 136 12/10/2013 1004   NA 133* 08/11/2013 1451   K 4.8 12/10/2013 1004   K 4.0 08/11/2013 1451   CL 99 08/11/2013 1451   CL 102 12/17/2012 0953   CO2 23 12/10/2013 1004   CO2 21 08/11/2013 1451   BUN 33.8* 12/10/2013 1004   BUN 9 08/11/2013 1451   CREATININE 0.9 12/10/2013 1004   CREATININE 0.49* 08/11/2013 1451   CREATININE 0.44* 07/16/2013 1228      Component Value Date/Time   CALCIUM 9.8 12/10/2013 1004  CALCIUM 8.7 08/11/2013 1451   ALKPHOS 36* 12/10/2013 1004   ALKPHOS 35* 08/24/2008 1510   AST 17 12/10/2013 1004   AST 23 08/24/2008 1510   ALT 15 12/10/2013 1004   ALT 18 08/24/2008 1510   BILITOT 0.37 12/10/2013 1004   BILITOT 0.7 08/24/2008 1510       BONE MARROW REPORT FINAL DIAGNOSIS RADIOGRAPHIC STUDIES:  ASSESSMENT AND PLAN: This is a very pleasant 68 years old white female with anemia of chronic disease plus/minus deficiency currently on Nu-iron and tolerating it fairly well.  Her recent CBC showed stable hemoglobin and hematocrit and she has normal iron study. I recommended for her to continue on the iron tablets for now. I would see her back for followup visit in 3 months with repeat CBC, iron study and ferritin. She was advised to call immediately if she has any concerning symptoms in the interval. All questions were answered. The patient knows to call the clinic with any problems, questions or concerns. We can certainly see the patient much sooner if necessary.  Disclaimer: This note was dictated with voice recognition software. Similar sounding words can inadvertently be transcribed and may not be corrected upon review.

## 2014-10-06 NOTE — Telephone Encounter (Signed)
Pt confirmed labs/ov per 11/17 POF, gave pt AVS..... KJ °

## 2014-10-12 ENCOUNTER — Inpatient Hospital Stay: Admission: RE | Admit: 2014-10-12 | Payer: Medicare Other | Source: Ambulatory Visit

## 2014-10-14 ENCOUNTER — Ambulatory Visit
Admission: RE | Admit: 2014-10-14 | Discharge: 2014-10-14 | Disposition: A | Discharge status: Home / Self Care | Payer: Medicare Other | Source: Ambulatory Visit | Attending: Family Medicine | Admitting: Family Medicine

## 2014-10-14 DIAGNOSIS — R918 Other nonspecific abnormal finding of lung field: Secondary | ICD-10-CM

## 2014-11-08 ENCOUNTER — Other Ambulatory Visit: Payer: Self-pay | Admitting: Cardiovascular Disease

## 2014-12-06 ENCOUNTER — Other Ambulatory Visit: Payer: Self-pay | Admitting: Cardiovascular Disease

## 2015-01-14 ENCOUNTER — Other Ambulatory Visit: Payer: Self-pay | Admitting: Family Medicine

## 2015-01-14 DIAGNOSIS — N183 Chronic kidney disease, stage 3 unspecified: Secondary | ICD-10-CM

## 2015-01-19 ENCOUNTER — Ambulatory Visit
Admission: RE | Admit: 2015-01-19 | Discharge: 2015-01-19 | Disposition: A | Discharge status: Home / Self Care | Payer: Medicare Other | Source: Ambulatory Visit | Attending: Family Medicine | Admitting: Family Medicine

## 2015-01-19 DIAGNOSIS — N183 Chronic kidney disease, stage 3 unspecified: Secondary | ICD-10-CM

## 2015-02-05 ENCOUNTER — Telehealth: Payer: Self-pay | Admitting: Internal Medicine

## 2015-02-05 NOTE — Telephone Encounter (Signed)
left voicemail for pt regarding to May appt 5.18 appt moved to 5.23 due to MD out of the office....mailed pt appt sched and letter

## 2015-03-02 ENCOUNTER — Encounter: Payer: Self-pay | Admitting: Internal Medicine

## 2015-03-11 ENCOUNTER — Telehealth: Payer: Self-pay | Admitting: Internal Medicine

## 2015-03-11 NOTE — Telephone Encounter (Signed)
returned pt call and confirmed that new # is updated...pt ok and aware

## 2015-03-18 ENCOUNTER — Other Ambulatory Visit: Payer: Self-pay | Admitting: Gastroenterology

## 2015-03-18 ENCOUNTER — Ambulatory Visit
Admission: RE | Admit: 2015-03-18 | Discharge: 2015-03-18 | Disposition: A | Discharge status: Home / Self Care | Payer: Medicare Other | Source: Ambulatory Visit | Attending: Gastroenterology | Admitting: Gastroenterology

## 2015-03-18 DIAGNOSIS — R109 Unspecified abdominal pain: Secondary | ICD-10-CM

## 2015-03-18 DIAGNOSIS — K5909 Other constipation: Secondary | ICD-10-CM

## 2015-04-01 ENCOUNTER — Other Ambulatory Visit: Payer: Self-pay | Admitting: Gastroenterology

## 2015-04-01 ENCOUNTER — Telehealth: Payer: Self-pay | Admitting: Internal Medicine

## 2015-04-01 DIAGNOSIS — R1084 Generalized abdominal pain: Secondary | ICD-10-CM

## 2015-04-01 DIAGNOSIS — R140 Abdominal distension (gaseous): Secondary | ICD-10-CM

## 2015-04-01 NOTE — Telephone Encounter (Signed)
s.w. pt and r/s appt to june due to pt having back problems

## 2015-04-02 ENCOUNTER — Other Ambulatory Visit: Payer: Medicare Other

## 2015-04-07 ENCOUNTER — Ambulatory Visit: Payer: Medicare Other | Admitting: Internal Medicine

## 2015-04-07 ENCOUNTER — Other Ambulatory Visit: Payer: Medicare Other

## 2015-04-12 ENCOUNTER — Ambulatory Visit: Payer: Medicare Other | Admitting: Internal Medicine

## 2015-04-13 ENCOUNTER — Ambulatory Visit
Admission: RE | Admit: 2015-04-13 | Discharge: 2015-04-13 | Disposition: A | Discharge status: Home / Self Care | Payer: Medicare Other | Source: Ambulatory Visit | Attending: Gastroenterology | Admitting: Gastroenterology

## 2015-04-13 DIAGNOSIS — R1084 Generalized abdominal pain: Secondary | ICD-10-CM

## 2015-04-13 DIAGNOSIS — R140 Abdominal distension (gaseous): Secondary | ICD-10-CM

## 2015-04-13 MED ORDER — IOHEXOL 300 MG/ML  SOLN
100.0000 mL | Freq: Once | INTRAMUSCULAR | Status: AC | PRN
  Administered 2015-04-13: 100 mL via INTRAVENOUS

## 2015-04-29 ENCOUNTER — Other Ambulatory Visit (HOSPITAL_BASED_OUTPATIENT_CLINIC_OR_DEPARTMENT_OTHER): Payer: Medicare Other

## 2015-04-29 DIAGNOSIS — D539 Nutritional anemia, unspecified: Secondary | ICD-10-CM

## 2015-04-29 DIAGNOSIS — D638 Anemia in other chronic diseases classified elsewhere: Secondary | ICD-10-CM | POA: Diagnosis not present

## 2015-04-29 LAB — IRON AND TIBC CHCC
%SAT: 23 % (ref 21–57)
Iron: 125 ug/dL (ref 41–142)
TIBC: 551 ug/dL — ABNORMAL HIGH (ref 236–444)
UIBC: 426 ug/dL — ABNORMAL HIGH (ref 120–384)

## 2015-04-29 LAB — CBC WITH DIFFERENTIAL/PLATELET
BASO%: 1.1 % (ref 0.0–2.0)
Basophils Absolute: 0.1 10*3/uL (ref 0.0–0.1)
EOS%: 3.2 % (ref 0.0–7.0)
Eosinophils Absolute: 0.2 10*3/uL (ref 0.0–0.5)
HCT: 33.6 % — ABNORMAL LOW (ref 34.8–46.6)
HGB: 11 g/dL — ABNORMAL LOW (ref 11.6–15.9)
LYMPH%: 35.3 % (ref 14.0–49.7)
MCH: 30.6 pg (ref 25.1–34.0)
MCHC: 32.8 g/dL (ref 31.5–36.0)
MCV: 93.3 fL (ref 79.5–101.0)
MONO#: 0.6 10*3/uL (ref 0.1–0.9)
MONO%: 10 % (ref 0.0–14.0)
NEUT#: 2.9 10*3/uL (ref 1.5–6.5)
NEUT%: 50.4 % (ref 38.4–76.8)
Platelets: 295 10*3/uL (ref 145–400)
RBC: 3.6 10*6/uL — ABNORMAL LOW (ref 3.70–5.45)
RDW: 15.6 % — ABNORMAL HIGH (ref 11.2–14.5)
WBC: 5.8 10*3/uL (ref 3.9–10.3)
lymph#: 2 10*3/uL (ref 0.9–3.3)

## 2015-04-29 LAB — FERRITIN CHCC: Ferritin: 308 ng/ml — ABNORMAL HIGH (ref 9–269)

## 2015-05-06 ENCOUNTER — Telehealth: Payer: Self-pay | Admitting: Internal Medicine

## 2015-05-06 ENCOUNTER — Encounter: Payer: Self-pay | Admitting: Internal Medicine

## 2015-05-06 ENCOUNTER — Ambulatory Visit (HOSPITAL_BASED_OUTPATIENT_CLINIC_OR_DEPARTMENT_OTHER): Payer: Medicare Other | Admitting: Internal Medicine

## 2015-05-06 VITALS — BP 139/65 | HR 88 | Temp 98.1°F | Resp 18 | Ht 65.0 in | Wt 169.1 lb

## 2015-05-06 DIAGNOSIS — D638 Anemia in other chronic diseases classified elsewhere: Secondary | ICD-10-CM | POA: Diagnosis not present

## 2015-05-06 DIAGNOSIS — D539 Nutritional anemia, unspecified: Secondary | ICD-10-CM

## 2015-05-06 NOTE — Telephone Encounter (Signed)
Pt confirmed labs/ov per 06/16 POF, gave pt AVS and Calender....... KJ °

## 2015-05-06 NOTE — Progress Notes (Signed)
[facility] [phone]   Fax:([phone]) [place], [place] [address]  DIAGNOSIS:  #1 reactive leukocytosis. Resloved.  #2 anemia of chronic disease plus/minus iron deficiency.   PRIOR THERAPY: None   CURRENT THERAPY:Nu-Iron 150 mg by mouth daily.   INTERVAL HISTORY: Katherine Larson 69 y.o. female returns to the clinic today for followup visit accompanied by her husband. The patient is feeling fine today with no specific complaints. She has improvement in her fatigue. She is currently on Nu-Iron and is tolerating her treatment with oral iron tablet fairly well with no significant adverse effects. She denied having any bleeding issues, bruises or ecchymosis She denied having any weight loss or night sweats. She has no palpable lymphadenopathy. She had repeat CBC and iron study performed recently and she is here for evaluation and discussion of her lab results.  MEDICAL HISTORY: Past Medical History  Diagnosis Date  . NSVT (nonsustained ventricular tachycardia)   . DM2 (diabetes mellitus, type 2)   . CAD (coronary artery disease)     s/p CABG 1996;  Myoview 3/11: mod partially reversible AS perfusion defect, most likely related to variable breast attenuation although an element of scar with peri-infarct ischemia also possible, EF 54% - felt to be low risk  . HTN (hypertension)   . HLD (hyperlipidemia)   . Asthma   . Neuropathy   . Complication of anesthesia   . PONV (postoperative nausea and vomiting)   . Shortness of breath     with activity  . Seasonal allergies   . GERD (gastroesophageal reflux disease)   . Constipation   . Diarrhea   . Arthritis     RA    ALLERGIES:  is allergic to biaxin and penicillins.  MEDICATIONS:  Current Outpatient Prescriptions  Medication Sig Dispense Refill  . allopurinol (ZYLOPRIM) 300 MG tablet Take 300 mg by mouth daily.    Marland Kitchen  amitriptyline (ELAVIL) 10 MG tablet Take 20 mg by mouth at bedtime.     Marland Kitchen atorvastatin (LIPITOR) 80 MG tablet Take 80 mg by mouth daily.    . fenofibrate 160 MG tablet Take 160 mg by mouth at bedtime.    . fexofenadine (ALLEGRA) 180 MG tablet Take 180 mg by mouth daily.      . furosemide (LASIX) 20 MG tablet Take 20 mg by mouth daily.    Marland Kitchen losartan-hydrochlorothiazide (HYZAAR) 100-25 MG per tablet Take 1 tablet by mouth every morning.     . metoCLOPramide (REGLAN) 5 MG tablet Take 5 mg by mouth at bedtime.     . metoprolol succinate (TOPROL-XL) 100 MG 24 hr tablet TAKE 1 TABLET BY MOUTH EVERY DAY 30 tablet 11  . metoprolol succinate (TOPROL-XL) 50 MG 24 hr tablet Take 50 mg by mouth every morning. Take with or immediately following a meal.    . montelukast (SINGULAIR) 10 MG tablet Take 10 mg by mouth at bedtime.    . Multiple Vitamins-Minerals (MACULAR VITAMIN BENEFIT PO) Take 2 tablets by mouth daily.    . Omega-3 Fatty Acids (FISH OIL) 1000 MG CAPS Take 3,000 mg by mouth daily.    . pantoprazole (PROTONIX) 40 MG tablet Take 40 mg by mouth 2 (two) times daily.    . pioglitazone (ACTOS) 15 MG tablet Take 15 mg by mouth daily.    Vladimir Faster Glycol-Propyl Glycol (SYSTANE OP) Apply 1 drop to eye 3 (  three) times daily as needed (Dry Eyes).    . nitroGLYCERIN (NITROSTAT) 0.4 MG SL tablet Place 0.4 mg under the tongue every 5 (five) minutes as needed for chest pain.     No current facility-administered medications for this visit.    SURGICAL HISTORY:  Past Surgical History  Procedure Laterality Date  . Cholecystectomy    . Vesicovaginal fistula closure w/ tah    . Ptca    . Coronary artery bypass graft  1996    2 vessels  . Hernia repair      hital hernia  . Eye surgery Bilateral   . Abdominal hysterectomy    . Fracture surgery Right     arm  . Maximum access (mas)posterior lumbar interbody fusion (plif) 1 level N/A 08/14/2013    Procedure:  MAXIMUM ACCESS SURGERY(MAS) POSTERIOR LUMBAR  INTERBODY FUSION LUMBAR THREE-FOUR ;  Surgeon: Eustace Moore, MD;  Location: [facility];  Service: Neurosurgery;  Laterality: N/A;   MAXIMUM ACCESS SURGERY(MAS) POSTERIOR LUMBAR INTERBODY FUSION LUMBAR THREE-FOUR     REVIEW OF SYSTEMS:  A comprehensive review of systems was negative except for: Constitutional: positive for fatigue   PHYSICAL EXAMINATION: General appearance: alert, cooperative, fatigued and no distress Head: Normocephalic, without obvious abnormality, atraumatic Neck: no adenopathy Lymph nodes: Cervical, supraclavicular, and axillary nodes normal. Resp: clear to auscultation bilaterally Cardio: regular rate and rhythm, S1, S2 normal, no murmur, click, rub or gallop GI: soft, non-tender; bowel sounds normal; no masses,  no organomegaly Extremities: extremities normal, atraumatic, no cyanosis or edema  ECOG PERFORMANCE STATUS: 1 - Symptomatic but completely ambulatory  Blood pressure 139/65, pulse 88, temperature 98.1 F (36.7 C), temperature source Oral, resp. rate 18, height 5\' 5"  (1.651 m), weight 169 lb 1.6 oz (76.703 kg), SpO2 98 %.  LABORATORY DATA: Lab Results  Component Value Date   WBC 5.8 04/29/2015   HGB 11.0* 04/29/2015   HCT 33.6* 04/29/2015   MCV 93.3 04/29/2015   PLT 295 04/29/2015      Chemistry      Component Value Date/Time   NA 136 12/10/2013 1004   NA 133* 08/11/2013 1451   K 4.8 12/10/2013 1004   K 4.0 08/11/2013 1451   CL 99 08/11/2013 1451   CL 102 12/17/2012 0953   CO2 23 12/10/2013 1004   CO2 21 08/11/2013 1451   BUN 33.8* 12/10/2013 1004   BUN 9 08/11/2013 1451   CREATININE 0.9 12/10/2013 1004   CREATININE 0.49* 08/11/2013 1451   CREATININE 0.44* 07/16/2013 1228      Component Value Date/Time   CALCIUM 9.8 12/10/2013 1004   CALCIUM 8.7 08/11/2013 1451   ALKPHOS 36* 12/10/2013 1004   ALKPHOS 35* 08/24/2008 1510   AST 17 12/10/2013 1004   AST 23 08/24/2008 1510   ALT 15 12/10/2013 1004   ALT 18 08/24/2008 1510   BILITOT  0.37 12/10/2013 1004   BILITOT 0.7 08/24/2008 1510       BONE MARROW REPORT FINAL DIAGNOSIS RADIOGRAPHIC STUDIES:  ASSESSMENT AND PLAN: This is a very pleasant 69 years old white female with anemia of chronic disease plus/minus deficiency currently on Nu-iron and tolerating it fairly well.  Her recent CBC showed improvement in hemoglobin and hematocrit and she has normal iron study. I recommended for her to continue on the iron tablets for now. I would see her back for followup visit in 6 months with repeat CBC, iron study and ferritin. She was advised to call immediately if she has  any concerning symptoms in the interval. All questions were answered. The patient knows to call the clinic with any problems, questions or concerns. We can certainly see the patient much sooner if necessary.  Disclaimer: This note was dictated with voice recognition software. Similar sounding words can inadvertently be transcribed and may not be corrected upon review.

## 2015-05-12 NOTE — Progress Notes (Signed)
Patient ID: Katherine Larson, female   DOB: 1946/04/04, 69 y.o.   MRN: [id number] Katherine Larson is a 69 y.o. patient of Dr.Wall we are seeing ongoing treatment and assessment of CAD with CABG in 1996, Hypertension, HLP, and diabetes. Last seen August 2014 Had atrial tachycardia/flutter On chronic anticoagulation.  Seen by Dr [name] who did not want to proceed with ablation since patient asymptomatic and no angina with rapid rates In office today had HRsl  Over 100  She gets fatigue and dyspnea.  Does not note chest pain or palpitations   ROS: Denies fever, malais, weight loss, blurry vision, decreased visual acuity, cough, sputum, SOB, hemoptysis, pleuritic pain, palpitaitons, heartburn, abdominal pain, melena, lower extremity edema, claudication, or rash.  All other systems reviewed and negative  General: Affect appropriate Healthy:  appears stated age 47: normal Neck supple with no adenopathy JVP normal no bruits no thyromegaly Lungs rhonchi and  wheezing and good diaphragmatic motion Heart:  S1/S2 no murmur, no rub, gallop or click PMI normal Abdomen: benighn, BS positve, no tenderness, no AAA no bruit.  No HSM or HJR Distal pulses intact with no bruits No edema Neuro non-focal Skin warm and dry No muscular weakness   Current Outpatient Prescriptions  Medication Sig Dispense Refill  . allopurinol (ZYLOPRIM) 300 MG tablet Take 300 mg by mouth daily.    Marland Kitchen amitriptyline (ELAVIL) 10 MG tablet Take 20 mg by mouth at bedtime.     Marland Kitchen atorvastatin (LIPITOR) 80 MG tablet Take 80 mg by mouth daily.    . fenofibrate 160 MG tablet Take 160 mg by mouth at bedtime.    . fexofenadine (ALLEGRA) 180 MG tablet Take 180 mg by mouth daily.      . furosemide (LASIX) 20 MG tablet Take 20 mg by mouth daily.    . metoCLOPramide (REGLAN) 5 MG tablet Take 10 mg by mouth at bedtime.     . metoprolol succinate (TOPROL-XL) 50 MG 24 hr tablet Take 50 mg by mouth every morning. Take with or immediately  following a meal.    . montelukast (SINGULAIR) 10 MG tablet Take 10 mg by mouth at bedtime.    . Multiple Vitamins-Minerals (MACULAR VITAMIN BENEFIT PO) Take 2 tablets by mouth daily.    . nitroGLYCERIN (NITROSTAT) 0.4 MG SL tablet Place 0.4 mg under the tongue every 5 (five) minutes as needed for chest pain.    . pantoprazole (PROTONIX) 40 MG tablet Take 40 mg by mouth 2 (two) times daily.    . pioglitazone (ACTOS) 15 MG tablet Take 15 mg by mouth daily.    Vladimir Faster Glycol-Propyl Glycol (SYSTANE OP) Apply 1 drop to eye 3 (three) times daily as needed (Dry Eyes).    Marland Kitchen losartan-hydrochlorothiazide (HYZAAR) 100-25 MG per tablet Take 1 tablet by mouth every morning.      No current facility-administered medications for this visit.    Allergies  Biaxin and Penicillins  Electrocardiogram:  07/16/13  Atrial tach/flutter rate 141  Nonspecific STT wave changes  07/22/13  SR rate 101 nonspecific ST changes   05/14/15 SR rate 99 nonspecific ST changes QT 362  Assessment and Plan Tachycardia:  Refer back to [facility]. ? Start antiarrhythmic vs ablation.  Change beta blocker to lopressor 75 bid CAD:  Post CABG no chest pain non ischemic myovue 2015 GERD:  Continue PPI improved DM:  On Actos  No volume overload f/u primary  Chol:  On statin labs with primary  [place]

## 2015-05-14 ENCOUNTER — Encounter: Payer: Self-pay | Admitting: Cardiovascular Disease

## 2015-05-14 ENCOUNTER — Ambulatory Visit (INDEPENDENT_AMBULATORY_CARE_PROVIDER_SITE_OTHER): Payer: Medicare Other | Admitting: Cardiovascular Disease

## 2015-05-14 VITALS — BP 140/80 | HR 101 | Ht 64.5 in | Wt 167.0 lb

## 2015-05-14 DIAGNOSIS — Z136 Encounter for screening for cardiovascular disorders: Secondary | ICD-10-CM

## 2015-05-14 MED ORDER — METOPROLOL TARTRATE 25 MG PO TABS: 75.0000 mg | ORAL_TABLET | Freq: Two times a day (BID) | ORAL | Status: DC

## 2015-05-14 NOTE — Patient Instructions (Signed)
Your physician recommends that you schedule a follow-up appointment in: next available with Dr [name], 1 year with Dr [name] Cancel    STOP Toprol   START Lopressor 75 mg twice a day    Thank you for choosing [facility] !

## 2015-05-28 ENCOUNTER — Ambulatory Visit: Payer: Medicare Other | Admitting: Cardiovascular Disease

## 2015-06-07 ENCOUNTER — Encounter: Payer: Self-pay | Admitting: Internal Medicine

## 2015-06-07 ENCOUNTER — Ambulatory Visit (INDEPENDENT_AMBULATORY_CARE_PROVIDER_SITE_OTHER): Payer: Medicare Other | Admitting: Internal Medicine

## 2015-06-07 VITALS — BP 128/72 | HR 76 | Ht 64.5 in | Wt 167.0 lb

## 2015-06-07 DIAGNOSIS — R000 Tachycardia, unspecified: Secondary | ICD-10-CM | POA: Insufficient documentation

## 2015-06-07 DIAGNOSIS — R197 Diarrhea, unspecified: Secondary | ICD-10-CM | POA: Insufficient documentation

## 2015-06-07 DIAGNOSIS — I471 Supraventricular tachycardia: Secondary | ICD-10-CM | POA: Diagnosis not present

## 2015-06-07 DIAGNOSIS — K5900 Constipation, unspecified: Secondary | ICD-10-CM

## 2015-06-07 NOTE — Patient Instructions (Signed)
  Your physician recommends that you schedule a follow-up appointment in: as needed  Your physician recommends that you continue on your current medications as directed. Please refer to the Current Medication list given to you today.   You have been referred to GI Dr Olevia Perches on [address] in [place] from [facility]     Thank you for choosing [facility] !

## 2015-06-07 NOTE — Assessment & Plan Note (Signed)
The patient notes both diarrhea and constipation. She has request a referral to a [facility] specialist. She also has a h/o anemia.

## 2015-06-07 NOTE — Assessment & Plan Note (Signed)
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 I have recommended a period of watchful waiting. She does not abuse caffeine. She will continue her current meds. I do not need to see her back unless she has recurrent tachycardia.

## 2015-06-07 NOTE — Progress Notes (Signed)
HPI Mrs. Counts returns today for evaluation of atrial tachycardia. I saw her 2 years ago with this and she was asymptomatic and did not perue evaluation. She has been stable in the interim. Her main complaint today is diarrhea and constipation. She denies palpitations. No sob though she is sedentary.  Allergies  Allergen Reactions  . Biaxin [Clarithromycin] Rash and Other (See Comments)    Blisters in mouth   . Penicillins Rash and Other (See Comments)    Blisters in mouth      Current Outpatient Prescriptions  Medication Sig Dispense Refill  . allopurinol (ZYLOPRIM) 300 MG tablet Take 300 mg by mouth daily.    Marland Kitchen amitriptyline (ELAVIL) 10 MG tablet Take 20 mg by mouth at bedtime.     Marland Kitchen atorvastatin (LIPITOR) 80 MG tablet Take 80 mg by mouth daily.    . fenofibrate 160 MG tablet Take 160 mg by mouth at bedtime.    . fexofenadine (ALLEGRA) 180 MG tablet Take 180 mg by mouth daily.      . furosemide (LASIX) 20 MG tablet Take 20 mg by mouth daily.    Marland Kitchen losartan-hydrochlorothiazide (HYZAAR) 100-25 MG per tablet Take 1 tablet by mouth every morning.     . metoCLOPramide (REGLAN) 5 MG tablet Take 10 mg by mouth at bedtime.     . metoprolol tartrate (LOPRESSOR) 25 MG tablet Take 3 tablets (75 mg total) by mouth 2 (two) times daily. 180 tablet 3  . montelukast (SINGULAIR) 10 MG tablet Take 10 mg by mouth at bedtime.    . Multiple Vitamins-Minerals (MACULAR VITAMIN BENEFIT PO) Take 2 tablets by mouth daily.    . nitroGLYCERIN (NITROSTAT) 0.4 MG SL tablet Place 0.4 mg under the tongue every 5 (five) minutes as needed for chest pain.    . Omega-3 Fatty Acids (FISH OIL) 1000 MG CAPS Take 1,000 mg by mouth 3 (three) times daily with meals.    . pantoprazole (PROTONIX) 40 MG tablet Take 40 mg by mouth 2 (two) times daily.    . pioglitazone (ACTOS) 15 MG tablet Take 15 mg by mouth daily.    Vladimir Faster Glycol-Propyl Glycol (SYSTANE OP) Apply 1 drop to eye 3 (three) times daily as needed (Dry Eyes).      No current facility-administered medications for this visit.     Past Medical History  Diagnosis Date  . NSVT (nonsustained ventricular tachycardia)   . DM2 (diabetes mellitus, type 2)   . CAD (coronary artery disease)     s/p CABG 1996;  Myoview 3/11: mod partially reversible AS perfusion defect, most likely related to variable breast attenuation although an element of scar with peri-infarct ischemia also possible, EF 54% - felt to be low risk  . HTN (hypertension)   . HLD (hyperlipidemia)   . Asthma   . Neuropathy   . Complication of anesthesia   . PONV (postoperative nausea and vomiting)   . Shortness of breath     with activity  . Seasonal allergies   . GERD (gastroesophageal reflux disease)   . Constipation   . Diarrhea   . Arthritis     RA    ROS:   All systems reviewed and negative except as noted in the HPI.   Past Surgical History  Procedure Laterality Date  . Cholecystectomy    . Vesicovaginal fistula closure w/ tah    . Ptca    . Coronary artery bypass graft  1996    2 vessels  . Hernia  repair      hital hernia  . Eye surgery Bilateral   . Abdominal hysterectomy    . Fracture surgery Right     arm  . Maximum access (mas)posterior lumbar interbody fusion (plif) 1 level N/A 08/14/2013    Procedure:  MAXIMUM ACCESS SURGERY(MAS) POSTERIOR LUMBAR INTERBODY FUSION LUMBAR THREE-FOUR ;  Surgeon: Eustace Moore, MD;  Location: [facility];  Service: Neurosurgery;  Laterality: N/A;   MAXIMUM ACCESS SURGERY(MAS) POSTERIOR LUMBAR INTERBODY FUSION LUMBAR THREE-FOUR      Family History  Problem Relation Age of Onset  . Heart attack Father   . Heart attack Mother      History   Social History  . Marital Status: Married    Spouse Name: N/A  . Number of Children: N/A  . Years of Education: N/A   Occupational History  . Not on file.   Social History Main Topics  . Smoking status: Former Smoker -- 15 years    Quit date: 11/20/1994  . Smokeless tobacco:  Not on file  . Alcohol Use: No  . Drug Use: No  . Sexual Activity: Not on file   Other Topics Concern  . Not on file   Social History Narrative     BP 128/72 mmHg  Pulse 76  Ht 5' 4.5" (1.638 m)  Wt 167 lb (75.751 kg)  BMI 28.23 kg/m2  SpO2 97%  Physical Exam:  Well appearing 69 yo woman, NAD HEENT: Unremarkable Neck:  7 cm JVD, no thyromegally Back:  No CVA tenderness Lungs:  Clear with no wheezes, rales, or rhonchi. HEART:  Regular rate rhythm, no murmurs, no rubs, no clicks Abd:  soft, positive bowel sounds, no organomegally, no rebound, no guarding Ext:  2 plus pulses, no edema, no cyanosis, no clubbing Skin:  No rashes no nodules Neuro:  CN II through XII intact, motor grossly intact  EKG from 04/2015 - sinus tachy at 100/min.   Assess/Plan:

## 2015-07-09 ENCOUNTER — Telehealth: Payer: Self-pay | Admitting: Gastroenterology

## 2015-07-09 ENCOUNTER — Telehealth: Payer: Self-pay

## 2015-07-09 NOTE — Telephone Encounter (Signed)
Rec'd from [facility] forward 40 pages to GI Historical Provider

## 2015-07-09 NOTE — Telephone Encounter (Signed)
Patient would like to transfer care from [facility]. She says her reason is, because she feels like her doctor wasn't doing enough for her. She states he only wants to give me medicine. Records given to [facility] for Dr. Ardis Hughs to review as [facility] of the Day.

## 2015-07-16 NOTE — Telephone Encounter (Signed)
Records have been reviewed and denied by both Dr. Ardis Hughs and Dr. Henrene Pastor.

## 2015-08-26 ENCOUNTER — Encounter (HOSPITAL_COMMUNITY): Payer: Self-pay

## 2015-08-26 ENCOUNTER — Observation Stay (HOSPITAL_COMMUNITY)
Admission: EM | Admit: 2015-08-26 | Discharge: 2015-08-27 | Disposition: A | Discharge status: Home / Self Care | Payer: Medicare Other | Attending: Internal Medicine | Admitting: Internal Medicine

## 2015-08-26 ENCOUNTER — Emergency Department (HOSPITAL_COMMUNITY): Payer: Medicare Other

## 2015-08-26 DIAGNOSIS — K219 Gastro-esophageal reflux disease without esophagitis: Secondary | ICD-10-CM | POA: Insufficient documentation

## 2015-08-26 DIAGNOSIS — E119 Type 2 diabetes mellitus without complications: Secondary | ICD-10-CM | POA: Insufficient documentation

## 2015-08-26 DIAGNOSIS — Z79899 Other long term (current) drug therapy: Secondary | ICD-10-CM | POA: Insufficient documentation

## 2015-08-26 DIAGNOSIS — I472 Ventricular tachycardia: Secondary | ICD-10-CM | POA: Insufficient documentation

## 2015-08-26 DIAGNOSIS — Z880 Allergy status to penicillin: Secondary | ICD-10-CM | POA: Diagnosis not present

## 2015-08-26 DIAGNOSIS — R51 Headache: Secondary | ICD-10-CM | POA: Diagnosis not present

## 2015-08-26 DIAGNOSIS — G629 Polyneuropathy, unspecified: Secondary | ICD-10-CM | POA: Diagnosis not present

## 2015-08-26 DIAGNOSIS — J45909 Unspecified asthma, uncomplicated: Secondary | ICD-10-CM | POA: Insufficient documentation

## 2015-08-26 DIAGNOSIS — I10 Essential (primary) hypertension: Secondary | ICD-10-CM | POA: Diagnosis not present

## 2015-08-26 DIAGNOSIS — M546 Pain in thoracic spine: Secondary | ICD-10-CM | POA: Diagnosis not present

## 2015-08-26 DIAGNOSIS — Z87891 Personal history of nicotine dependence: Secondary | ICD-10-CM | POA: Insufficient documentation

## 2015-08-26 DIAGNOSIS — M549 Dorsalgia, unspecified: Secondary | ICD-10-CM | POA: Diagnosis present

## 2015-08-26 DIAGNOSIS — I2510 Atherosclerotic heart disease of native coronary artery without angina pectoris: Secondary | ICD-10-CM | POA: Diagnosis not present

## 2015-08-26 DIAGNOSIS — R0602 Shortness of breath: Secondary | ICD-10-CM | POA: Diagnosis not present

## 2015-08-26 DIAGNOSIS — R519 Headache, unspecified: Secondary | ICD-10-CM

## 2015-08-26 DIAGNOSIS — M109 Gout, unspecified: Secondary | ICD-10-CM | POA: Insufficient documentation

## 2015-08-26 DIAGNOSIS — K5900 Constipation, unspecified: Secondary | ICD-10-CM | POA: Diagnosis not present

## 2015-08-26 DIAGNOSIS — M1990 Unspecified osteoarthritis, unspecified site: Secondary | ICD-10-CM | POA: Insufficient documentation

## 2015-08-26 DIAGNOSIS — E785 Hyperlipidemia, unspecified: Secondary | ICD-10-CM | POA: Diagnosis not present

## 2015-08-26 HISTORY — DX: Gout, unspecified: M10.9

## 2015-08-26 LAB — CBC WITH DIFFERENTIAL/PLATELET
Basophils Absolute: 0 10*3/uL (ref 0.0–0.1)
Basophils Relative: 0 %
Eosinophils Absolute: 0.5 10*3/uL (ref 0.0–0.7)
Eosinophils Relative: 4 %
HCT: 37.4 % (ref 36.0–46.0)
Hemoglobin: 12.4 g/dL (ref 12.0–15.0)
Lymphocytes Relative: 17 %
Lymphs Abs: 1.9 10*3/uL (ref 0.7–4.0)
MCH: 31.2 pg (ref 26.0–34.0)
MCHC: 33.2 g/dL (ref 30.0–36.0)
MCV: 94 fL (ref 78.0–100.0)
Monocytes Absolute: 1 10*3/uL (ref 0.1–1.0)
Monocytes Relative: 9 %
Neutro Abs: 7.7 10*3/uL (ref 1.7–7.7)
Neutrophils Relative %: 70 %
Platelets: 198 10*3/uL (ref 150–400)
RBC: 3.98 MIL/uL (ref 3.87–5.11)
RDW: 14.6 % (ref 11.5–15.5)
WBC: 11.1 10*3/uL — ABNORMAL HIGH (ref 4.0–10.5)

## 2015-08-26 LAB — I-STAT TROPONIN, ED: Troponin i, poc: 0 ng/mL (ref 0.00–0.08)

## 2015-08-26 LAB — TROPONIN I
Troponin I: <0.03 ng/mL (ref <0.031)
Troponin I: <0.03 ng/mL (ref <0.031)

## 2015-08-26 LAB — BASIC METABOLIC PANEL
Anion gap: 8 (ref 5–15)
BUN: 14 mg/dL (ref 6–20)
CO2: 24 mmol/L (ref 22–32)
Calcium: 8 mg/dL — ABNORMAL LOW (ref 8.9–10.3)
Chloride: 106 mmol/L (ref 101–111)
Creatinine, Ser: 0.54 mg/dL (ref 0.44–1.00)
GFR calc Af Amer: >60 mL/min (ref >60)
GFR calc non Af Amer: >60 mL/min (ref >60)
Glucose, Bld: 126 mg/dL — ABNORMAL HIGH (ref 65–99)
Potassium: 3.3 mmol/L — ABNORMAL LOW (ref 3.5–5.1)
Sodium: 138 mmol/L (ref 135–145)

## 2015-08-26 LAB — D-DIMER, QUANTITATIVE (NOT AT ARMC): D-Dimer, Quant: 0.45 ug/mL-FEU (ref 0.00–0.48)

## 2015-08-26 MED ORDER — ALBUTEROL SULFATE HFA 108 (90 BASE) MCG/ACT IN AERS: 2.0000 | INHALATION_SPRAY | Freq: Four times a day (QID) | RESPIRATORY_TRACT | Status: DC | PRN

## 2015-08-26 MED ORDER — MOMETASONE FURO-FORMOTEROL FUM 100-5 MCG/ACT IN AERO
2.0000 | INHALATION_SPRAY | Freq: Two times a day (BID) | RESPIRATORY_TRACT | Status: DC
  Administered 2015-08-26 – 2015-08-27 (×2): 2 via RESPIRATORY_TRACT
  Filled 2015-08-26: qty 8.8

## 2015-08-26 MED ORDER — ALLOPURINOL 300 MG PO TABS
300.0000 mg | ORAL_TABLET | Freq: Every day | ORAL | Status: DC
  Administered 2015-08-27: 300 mg via ORAL
  Filled 2015-08-26: qty 1

## 2015-08-26 MED ORDER — HYDROCODONE-ACETAMINOPHEN 5-325 MG PO TABS
1.0000 | ORAL_TABLET | Freq: Four times a day (QID) | ORAL | Status: DC | PRN
  Administered 2015-08-26 – 2015-08-27 (×3): 1 via ORAL
  Filled 2015-08-26 (×3): qty 1

## 2015-08-26 MED ORDER — HYDROXYCHLOROQUINE SULFATE 200 MG PO TABS
400.0000 mg | ORAL_TABLET | Freq: Every day | ORAL | Status: DC
  Filled 2015-08-26 (×2): qty 2

## 2015-08-26 MED ORDER — PANTOPRAZOLE SODIUM 40 MG PO TBEC
40.0000 mg | DELAYED_RELEASE_TABLET | Freq: Two times a day (BID) | ORAL | Status: DC
  Administered 2015-08-26 – 2015-08-27 (×2): 40 mg via ORAL
  Filled 2015-08-26 (×2): qty 1

## 2015-08-26 MED ORDER — METOPROLOL TARTRATE 50 MG PO TABS
75.0000 mg | ORAL_TABLET | Freq: Two times a day (BID) | ORAL | Status: DC
  Administered 2015-08-26 – 2015-08-27 (×2): 75 mg via ORAL
  Filled 2015-08-26 (×4): qty 1

## 2015-08-26 MED ORDER — MONTELUKAST SODIUM 10 MG PO TABS
10.0000 mg | ORAL_TABLET | Freq: Every day | ORAL | Status: DC
  Administered 2015-08-26: 10 mg via ORAL
  Filled 2015-08-26: qty 1

## 2015-08-26 MED ORDER — ONDANSETRON HCL 4 MG/2ML IJ SOLN: 4.0000 mg | Freq: Four times a day (QID) | INTRAMUSCULAR | Status: DC | PRN

## 2015-08-26 MED ORDER — ACETAMINOPHEN 325 MG PO TABS
650.0000 mg | ORAL_TABLET | ORAL | Status: DC | PRN
Start: 2015-08-26 — End: 2015-08-27

## 2015-08-26 MED ORDER — HYDROXYCHLOROQUINE SULFATE 200 MG PO TABS
400.0000 mg | ORAL_TABLET | Freq: Every day | ORAL | Status: DC
  Administered 2015-08-26: 400 mg via ORAL
  Filled 2015-08-26 (×4): qty 2

## 2015-08-26 MED ORDER — MORPHINE SULFATE (PF) 2 MG/ML IV SOLN: 2.0000 mg | INTRAVENOUS | Status: DC | PRN

## 2015-08-26 MED ORDER — AMITRIPTYLINE HCL 10 MG PO TABS
20.0000 mg | ORAL_TABLET | Freq: Every day | ORAL | Status: DC
  Administered 2015-08-26: 20 mg via ORAL
  Filled 2015-08-26: qty 2

## 2015-08-26 MED ORDER — FUROSEMIDE 20 MG PO TABS
20.0000 mg | ORAL_TABLET | Freq: Every day | ORAL | Status: DC
  Administered 2015-08-27: 20 mg via ORAL
  Filled 2015-08-26: qty 1

## 2015-08-26 MED ORDER — ATORVASTATIN CALCIUM 40 MG PO TABS
80.0000 mg | ORAL_TABLET | Freq: Every day | ORAL | Status: DC
  Administered 2015-08-26: 80 mg via ORAL
  Filled 2015-08-26: qty 2

## 2015-08-26 MED ORDER — ASPIRIN EC 81 MG PO TBEC
81.0000 mg | DELAYED_RELEASE_TABLET | Freq: Every day | ORAL | Status: DC
  Administered 2015-08-27: 81 mg via ORAL
  Filled 2015-08-26: qty 1

## 2015-08-26 MED ORDER — LEFLUNOMIDE 20 MG PO TABS
20.0000 mg | ORAL_TABLET | Freq: Every day | ORAL | Status: DC
  Administered 2015-08-27: 20 mg via ORAL
  Filled 2015-08-26 (×5): qty 1

## 2015-08-26 MED ORDER — ASPIRIN 81 MG PO CHEW
324.0000 mg | CHEWABLE_TABLET | Freq: Once | ORAL | Status: AC
  Administered 2015-08-26: 324 mg via ORAL
  Filled 2015-08-26: qty 4

## 2015-08-26 MED ORDER — FLUTICASONE PROPIONATE 50 MCG/ACT NA SUSP
2.0000 | Freq: Every day | NASAL | Status: DC
  Administered 2015-08-27: 2 via NASAL
  Filled 2015-08-26: qty 16

## 2015-08-26 MED ORDER — ACETAMINOPHEN 325 MG PO TABS: 650.0000 mg | ORAL_TABLET | Freq: Four times a day (QID) | ORAL | Status: DC | PRN

## 2015-08-26 MED ORDER — METOCLOPRAMIDE HCL 10 MG PO TABS
10.0000 mg | ORAL_TABLET | Freq: Every day | ORAL | Status: DC
  Administered 2015-08-26: 10 mg via ORAL
  Filled 2015-08-26: qty 1

## 2015-08-26 MED ORDER — GI COCKTAIL ~~LOC~~: 30.0000 mL | Freq: Four times a day (QID) | ORAL | Status: DC | PRN

## 2015-08-26 MED ORDER — ASPIRIN EC 325 MG PO TBEC: 325.0000 mg | DELAYED_RELEASE_TABLET | Freq: Every day | ORAL | Status: DC

## 2015-08-26 MED ORDER — NITROGLYCERIN 0.4 MG SL SUBL: 0.4000 mg | SUBLINGUAL_TABLET | SUBLINGUAL | Status: DC | PRN

## 2015-08-26 MED ORDER — POLYVINYL ALCOHOL 1.4 % OP SOLN: OPHTHALMIC | Status: DC | PRN

## 2015-08-26 MED ORDER — ALBUTEROL SULFATE (2.5 MG/3ML) 0.083% IN NEBU: 2.5000 mg | INHALATION_SOLUTION | Freq: Four times a day (QID) | RESPIRATORY_TRACT | Status: DC | PRN

## 2015-08-26 MED ORDER — HYDROCODONE-ACETAMINOPHEN 5-325 MG PO TABS
1.0000 | ORAL_TABLET | Freq: Once | ORAL | Status: AC
  Administered 2015-08-26: 1 via ORAL
  Filled 2015-08-26: qty 1

## 2015-08-26 NOTE — H&P (Signed)
History and Physical  Katherine Larson IHK:[id number] DOB: [birth year] DOA: 08/26/2015  Referring physician: Forde Dandy, MD PCP: [name], MD   Chief Complaint: SOB, back pain   HPI:  69 year old female with a hx of HTN, DM type 2, CAD s/p CABG, and HLD presented with headache, elevated blood pressure, back pain and SOB. While in the ED, patient was noted to be hypertensive at 197/82. Otherwise, her vitals were stable. Labs revealed a mild leukocytosis at 11.1, negative D-dimer and negative troponin. CXR was unremarkable. She was referred for observation for back pain and possible anginal equivalent.  SOB, nausea, and intrascapular back pain began 2 weeks ago, associated with bronchitis. Her back pain is burning and is worse with movement and while taking a deep breath. Her SOB is worse on exertion. She saw her PCP two weeks ago who diagnosed her with bronchitis and started her on Prednisone and Levaquin which she took as prescribed (finished 9/30). Patient only had temporary relief of her symptoms so she returned to the urgent care clinic on 10/5 where she was diagnosed with pleurisy. Last night, her symptoms worsened, prompting her visit to the ED. She also complains of a right sided frontal HA that began at 3am this morning and elevated BP (200s/92 at home). She denies any fever, chills, vomiting,  visual changes, numbness, one sided weakness, or tingling.  Patient has mild CP only while she coughs and mild centralized abdominal pain.   In the emergency department VSS. Afebrile, not hypoxic Pertinent labs: CBC unremarkable, Potassium 3.3 - BMP otherwise unremarkable,   D-dimer 0.45 EKG: Independently reviewed. SR Imaging: independently reviewed. CXR; no acute disease  Review of Systems:  Positive for SOB, back pain, HA, CP while coughing, abdominal pain, and hypertension Negative for fever, visual changes, sore throat, rash ,  dysuria, bleeding, n/v.  Past Medical History  Diagnosis  Date  . NSVT (nonsustained ventricular tachycardia) ([place])   . DM2 (diabetes mellitus, type 2) ([place])   . CAD (coronary artery disease)     s/p CABG 1996;  Myoview 3/11: mod partially reversible AS perfusion defect, most likely related to variable breast attenuation although an element of scar with peri-infarct ischemia also possible, EF 54% - felt to be low risk  . HTN (hypertension)   . HLD (hyperlipidemia)   . Asthma   . Neuropathy ([place])   . Complication of anesthesia   . PONV (postoperative nausea and vomiting)   . Shortness of breath     with activity  . Seasonal allergies   . GERD (gastroesophageal reflux disease)   . Constipation   . Diarrhea   . Arthritis     RA  . Gout     Past Surgical History  Procedure Laterality Date  . Cholecystectomy    . Vesicovaginal fistula closure w/ tah    . Ptca    . Coronary artery bypass graft  1996    2 vessels  . Hernia repair      hital hernia  . Eye surgery Bilateral   . Abdominal hysterectomy    . Fracture surgery Right     arm  . Maximum access (mas)posterior lumbar interbody fusion (plif) 1 level N/A 08/14/2013    Procedure:  MAXIMUM ACCESS SURGERY(MAS) POSTERIOR LUMBAR INTERBODY FUSION LUMBAR THREE-FOUR ;  Surgeon: Eustace Moore, MD;  Location: [facility];  Service: Neurosurgery;  Laterality: N/A;   MAXIMUM ACCESS SURGERY(MAS) POSTERIOR LUMBAR INTERBODY FUSION LUMBAR THREE-FOUR   . Appendectomy  Social History:  reports that she quit smoking about 20 years ago. She does not have any smokeless tobacco history on file. She reports that she does not drink alcohol or use illicit drugs. lives with their spouse Self-care  Allergies  Allergen Reactions  . Biaxin [Clarithromycin] Rash and Other (See Comments)    Blisters in mouth   . Penicillins Rash and Other (See Comments)    Blisters in mouth     Family History  Problem Relation Age of Onset  . Heart attack Father   . Heart attack Mother      Prior to Admission  medications   Medication Sig Start Date End Date Taking? Authorizing Provider  albuterol (PROVENTIL HFA;VENTOLIN HFA) 108 (90 BASE) MCG/ACT inhaler Inhale 2 puffs into the lungs every 6 (six) hours as needed for wheezing or shortness of breath.   Yes Historical Provider, MD  allopurinol (ZYLOPRIM) 300 MG tablet Take 300 mg by mouth daily.   Yes Historical Provider, MD  amitriptyline (ELAVIL) 10 MG tablet Take 20 mg by mouth at bedtime.    Yes Historical Provider, MD  atorvastatin (LIPITOR) 80 MG tablet Take 80 mg by mouth daily.   Yes Historical Provider, MD  fexofenadine (ALLEGRA) 180 MG tablet Take 180 mg by mouth daily.     Yes Historical Provider, MD  fluticasone (FLONASE) 50 MCG/ACT nasal spray Place 2 sprays into both nostrils daily.   Yes Historical Provider, MD  furosemide (LASIX) 20 MG tablet Take 20 mg by mouth daily.   Yes Historical Provider, MD  hydroxychloroquine (PLAQUENIL) 200 MG tablet Take 400 mg by mouth daily.   Yes Historical Provider, MD  iron polysaccharides (NIFEREX) 150 MG capsule Take 150 mg by mouth daily.   Yes Historical Provider, MD  leflunomide (ARAVA) 20 MG tablet Take 20 mg by mouth daily.   Yes Historical Provider, MD  metoCLOPramide (REGLAN) 5 MG tablet Take 10 mg by mouth at bedtime.    Yes Historical Provider, MD  metoprolol tartrate (LOPRESSOR) 25 MG tablet Take 3 tablets (75 mg total) by mouth 2 (two) times daily. 05/14/15  Yes Josue Hector, MD  mometasone-formoterol (DULERA) 100-5 MCG/ACT AERO Inhale 2 puffs into the lungs 2 (two) times daily.   Yes Historical Provider, MD  montelukast (SINGULAIR) 10 MG tablet Take 10 mg by mouth at bedtime.   Yes Historical Provider, MD  Multiple Vitamins-Minerals (MACULAR VITAMIN BENEFIT PO) Take 2 tablets by mouth daily.   Yes Historical Provider, MD  naproxen (NAPROSYN) 500 MG tablet Take 500 mg by mouth 2 (two) times daily with a meal.   Yes Historical Provider, MD  nitroGLYCERIN (NITROSTAT) 0.4 MG SL tablet Place 0.4  mg under the tongue every 5 (five) minutes as needed for chest pain.   Yes Historical Provider, MD  Omega-3 Fatty Acids (FISH OIL) 1000 MG CAPS Take 1,000 mg by mouth 3 (three) times daily with meals.   Yes Historical Provider, MD  pantoprazole (PROTONIX) 40 MG tablet Take 40 mg by mouth 2 (two) times daily.   Yes Historical Provider, MD  Polyethyl Glycol-Propyl Glycol (SYSTANE OP) Apply 1 drop to eye 3 (three) times daily as needed (Dry Eyes).   Yes Historical Provider, MD  triamcinolone cream (KENALOG) 0.1 % Apply 1 application topically 2 (two) times daily.   Yes Historical Provider, MD   Physical Exam: Filed Vitals:   08/26/15 0900 08/26/15 0930 08/26/15 1000 08/26/15 1105  BP: 168/67 149/53 153/59 144/59  Pulse: 76 70 71 73  Temp:      TempSrc:      Resp: 19 19 12 12   Height:      Weight:      SpO2: 95% 94% 95% 96%    Vss, afebrile, not hypoxic General:  Appears comfortable, calm. Eyes: PERRL, normal lids, irises ENT: grossly normal hearing, lips, tongue Neck: no LAD, masses, thyromegaly Cardiovascular: Regular rate and rhythm, no murmur, rub or gallop. No lower extremity edema. Respiratory: Clear to auscultation bilaterally, no wheezes, rales or rhonchi. Normal respiratory effort. Abdomen: soft, ntnd, normal BS, no rebound or guarding.  Skin: no rash or induration  Musculoskeletal: grossly normal tone bilateral upper and lower extremities Psychiatric: grossly normal mood and affect, speech fluent and appropriate Neurologic: grossly non-focal.   Wt Readings from Last 3 Encounters:  08/26/15 73.936 kg (163 lb)  06/07/15 75.751 kg (167 lb)  05/14/15 75.751 kg (167 lb)    Labs on Admission:  Basic Metabolic Panel:  Recent Labs Lab 08/26/15 0750  NA 138  K 3.3*  CL 106  CO2 24  GLUCOSE 126*  BUN 14  CREATININE 0.54  CALCIUM 8.0*      CBC:  Recent Labs Lab 08/26/15 0750  WBC 11.1*  NEUTROABS 7.7  HGB 12.4  HCT 37.4  MCV 94.0  PLT 198      Recent  Labs  08/26/15 0838  TROPIPOC 0.00       Radiological Exams on Admission: Dg Chest 2 View  08/26/2015   CLINICAL DATA:  69 year old female with shortness of breath, headache, pleurisy. Initial encounter.  EXAM: CHEST  2 VIEW  COMPARISON:  07/2015 and earlier.  FINDINGS: Stable lung volumes. Sequelae of CABG. Normal cardiac size and mediastinal contours. Visualized tracheal air column is within normal limits. No pneumothorax, pulmonary edema, pleural effusion or acute pulmonary opacity. Cholecystectomy and other epigastric surgical clips appears stable. Partially visible lumbar fusion hardware. Osteopenia. Chronic anterior right lower rib fractures. No acute osseous abnormality identified. Calcified atherosclerosis of the aorta.  IMPRESSION: No acute cardiopulmonary abnormality.   Electronically Signed   By: Genevie Ann M.D.   On: 08/26/2015 08:14      Principal Problem:   Back pain Active Problems:   Coronary atherosclerosis of native coronary artery   DYSPNEA   Headache   HTN (hypertension)   DM type 2 (diabetes mellitus, type 2) ([facility])   Assessment/Plan 1. Pleurisy and thoracic back painin. EKG non-acute, troponin negative. Doubt ACS / anginal component. Likely related to recent bronchitis. Ddimer negative, no edema.  2. Frontal HA without focal findings, nearly resolved, correlating with decreased blood pressure. Likely related to elevated blood pressure.  3. Accelerated HTN, resolved. 4. Essential hypertension. 5. DM type 2, stable.   6. CAD, s/p CABG   Appears stable, HA nearly resolved. Back pain atypical. Give ASA.  Obs to medical bed. Serial troponin and monitor on Tele.   Supportive care, monitor blood pressure  Anticipate discharge tomorrow if continues to improve.  Code Status: Full  DVT prophylaxis:SCDs Family Communication: Husband at bedside. Discussed with patient who understands and has no concerns at this time. Disposition Plan/Anticipated LOS: Admit to  medical bed.   Time spent: 55 minutes  Murray Hodgkins, MD  [facility] Pager [phone] 08/26/2015, 12:45 PM   By signing my name below, I, Rosalie Doctor attest that this documentation has been prepared under the direction and in the presence of Murray Hodgkins, MD Electronically signed: Rosalie Doctor, Scribe.  08/26/2015 12:41 pm  I personally  performed the services described in this documentation. All medical record entries made by the scribe were at my direction. I have reviewed the chart and agree that the record reflects my personal performance and is accurate and complete. Murray Hodgkins, MD

## 2015-08-26 NOTE — ED Notes (Signed)
I went to the walk in clinic last night and they said I had pleurisy. I was checking my blood pressure last night and it was high and I have a headache per pt.

## 2015-08-26 NOTE — ED Provider Notes (Signed)
CSN: [id number]     Arrival date & time 08/26/15  [id number] History   First MD Initiated Contact with Patient 08/26/15 [phone]     Chief Complaint  Patient presents with  . Hypertension     (Consider location/radiation/quality/duration/timing/severity/associated sxs/prior Treatment) HPI 69 year old female who presents with concern of elevated blood pressure. She has a history of hypertension, type 2 diabetes, CAD status post CABG, and hyperlipidemia. Reports 2 week history of intrascapular back pain and shortness of breath. I initially had felt congested and had seen her primary care doctor. Had a negative chest x-ray was diagnosed with likely bronchitis and was started on prednisone and Levaquin, which she has finished now. She continues to have persistent symptoms, not improved by those medications. Has felt short of breath with activity. Back pain is worse when she takes a deep breath in, but also noticeable when she moves. Yesterday evening went to urgent care to have a reevaluation and was discharged with pleurisy. She had difficulty sleeping last night, and developed tension headache and elevated blood pressure of 200 over 80s. HA 6/10 in severity, not of sudden onset maximal intensity, without vision/speech changes, numbness/weakness, gait instability.  She came to the ED for reevaluation. She has been compliant with medications and has had last medication changes in August 2016. Denies cough, fever, chills, or night sweats. No coughing, sore throat, runny nose.  Denies lower extremity swelling or pain. No prior history of thromboembolic disease, recent immobolization, malignancy, or strong family history of thromboembolic disease.   Past Medical History  Diagnosis Date  . NSVT (nonsustained ventricular tachycardia) ([place])   . DM2 (diabetes mellitus, type 2) ([place])   . CAD (coronary artery disease)     s/p CABG 1996;  Myoview 3/11: mod partially reversible AS perfusion defect, most likely related  to variable breast attenuation although an element of scar with peri-infarct ischemia also possible, EF 54% - felt to be low risk  . HTN (hypertension)   . HLD (hyperlipidemia)   . Asthma   . Neuropathy ([place])   . Complication of anesthesia   . PONV (postoperative nausea and vomiting)   . Shortness of breath     with activity  . Seasonal allergies   . GERD (gastroesophageal reflux disease)   . Constipation   . Diarrhea   . Arthritis     RA   Past Surgical History  Procedure Laterality Date  . Cholecystectomy    . Vesicovaginal fistula closure w/ tah    . Ptca    . Coronary artery bypass graft  1996    2 vessels  . Hernia repair      hital hernia  . Eye surgery Bilateral   . Abdominal hysterectomy    . Fracture surgery Right     arm  . Maximum access (mas)posterior lumbar interbody fusion (plif) 1 level N/A 08/14/2013    Procedure:  MAXIMUM ACCESS SURGERY(MAS) POSTERIOR LUMBAR INTERBODY FUSION LUMBAR THREE-FOUR ;  Surgeon: Eustace Moore, MD;  Location: [place] [facility];  Service: Neurosurgery;  Laterality: N/A;   MAXIMUM ACCESS SURGERY(MAS) POSTERIOR LUMBAR INTERBODY FUSION LUMBAR THREE-FOUR    Family History  Problem Relation Age of Onset  . Heart attack Father   . Heart attack Mother    Social History  Substance Use Topics  . Smoking status: Former Smoker -- 15 years    Quit date: 11/20/1994  . Smokeless tobacco: None  . Alcohol Use: No   OB History    No data  available     Review of Systems 10/14 systems reviewed and are negative other than those stated in the HPI   Allergies  Biaxin and Penicillins  Home Medications   Prior to Admission medications   Medication Sig Start Date End Date Taking? Authorizing Provider  albuterol (PROVENTIL HFA;VENTOLIN HFA) 108 (90 BASE) MCG/ACT inhaler Inhale 2 puffs into the lungs every 6 (six) hours as needed for wheezing or shortness of breath.   Yes Historical Provider, MD  allopurinol (ZYLOPRIM) 300 MG tablet Take 300 mg by  mouth daily.   Yes Historical Provider, MD  amitriptyline (ELAVIL) 10 MG tablet Take 20 mg by mouth at bedtime.    Yes Historical Provider, MD  atorvastatin (LIPITOR) 80 MG tablet Take 80 mg by mouth daily.   Yes Historical Provider, MD  fexofenadine (ALLEGRA) 180 MG tablet Take 180 mg by mouth daily.     Yes Historical Provider, MD  fluticasone (FLONASE) 50 MCG/ACT nasal spray Place 2 sprays into both nostrils daily.   Yes Historical Provider, MD  furosemide (LASIX) 20 MG tablet Take 20 mg by mouth daily.   Yes Historical Provider, MD  hydroxychloroquine (PLAQUENIL) 200 MG tablet Take 400 mg by mouth daily.   Yes Historical Provider, MD  iron polysaccharides (NIFEREX) 150 MG capsule Take 150 mg by mouth daily.   Yes Historical Provider, MD  leflunomide (ARAVA) 20 MG tablet Take 20 mg by mouth daily.   Yes Historical Provider, MD  metoCLOPramide (REGLAN) 5 MG tablet Take 10 mg by mouth at bedtime.    Yes Historical Provider, MD  metoprolol tartrate (LOPRESSOR) 25 MG tablet Take 3 tablets (75 mg total) by mouth 2 (two) times daily. 05/14/15  Yes Josue Hector, MD  mometasone-formoterol (DULERA) 100-5 MCG/ACT AERO Inhale 2 puffs into the lungs 2 (two) times daily.   Yes Historical Provider, MD  montelukast (SINGULAIR) 10 MG tablet Take 10 mg by mouth at bedtime.   Yes Historical Provider, MD  Multiple Vitamins-Minerals (MACULAR VITAMIN BENEFIT PO) Take 2 tablets by mouth daily.   Yes Historical Provider, MD  naproxen (NAPROSYN) 500 MG tablet Take 500 mg by mouth 2 (two) times daily with a meal.   Yes Historical Provider, MD  nitroGLYCERIN (NITROSTAT) 0.4 MG SL tablet Place 0.4 mg under the tongue every 5 (five) minutes as needed for chest pain.   Yes Historical Provider, MD  Omega-3 Fatty Acids (FISH OIL) 1000 MG CAPS Take 1,000 mg by mouth 3 (three) times daily with meals.   Yes Historical Provider, MD  pantoprazole (PROTONIX) 40 MG tablet Take 40 mg by mouth 2 (two) times daily.   Yes Historical  Provider, MD  Polyethyl Glycol-Propyl Glycol (SYSTANE OP) Apply 1 drop to eye 3 (three) times daily as needed (Dry Eyes).   Yes Historical Provider, MD  triamcinolone cream (KENALOG) 0.1 % Apply 1 application topically 2 (two) times daily.   Yes Historical Provider, MD   BP 168/67 mmHg  Pulse 76  Temp(Src) 98.5 F (36.9 C) (Oral)  Resp 19  Ht 5' 4.5" (1.638 m)  Wt 163 lb (73.936 kg)  BMI 27.56 kg/m2  SpO2 95% Physical Exam Physical Exam  Nursing note and vitals reviewed. Constitutional: Well developed, well nourished, non-toxic, and in no acute distress Head: Normocephalic and atraumatic.  Mouth/Throat: Oropharynx is clear and moist.  Neck: Normal range of motion. Neck supple. No meningismus. Cardiovascular: Normal rate and regular rhythm.  No edema. +2 symmetric peripheral pulses.  Pulmonary/Chest: Effort normal and  breath sounds normal.  Abdominal: Soft. There is no tenderness. There is no rebound and no guarding.  Musculoskeletal: Normal range of motion. No back tenderness. Neurological: Alert, no facial droop, fluent speech, moves all extremities symmetrically Skin: Skin is warm and dry.  Psychiatric: Cooperative  ED Course  Procedures (including critical care time) Labs Review Labs Reviewed  CBC WITH DIFFERENTIAL/PLATELET - Abnormal; Notable for the following:    WBC 11.1 (*)    All other components within normal limits  BASIC METABOLIC PANEL - Abnormal; Notable for the following:    Potassium 3.3 (*)    Glucose, Bld 126 (*)    Calcium 8.0 (*)    All other components within normal limits  D-DIMER, QUANTITATIVE (NOT AT [facility])  Randolm Idol, ED    Imaging Review Dg Chest 2 View  08/26/2015   CLINICAL DATA:  69 year old female with shortness of breath, headache, pleurisy. Initial encounter.  EXAM: CHEST  2 VIEW  COMPARISON:  07/2015 and earlier.  FINDINGS: Stable lung volumes. Sequelae of CABG. Normal cardiac size and mediastinal contours. Visualized tracheal air  column is within normal limits. No pneumothorax, pulmonary edema, pleural effusion or acute pulmonary opacity. Cholecystectomy and other epigastric surgical clips appears stable. Partially visible lumbar fusion hardware. Osteopenia. Chronic anterior right lower rib fractures. No acute osseous abnormality identified. Calcified atherosclerosis of the aorta.  IMPRESSION: No acute cardiopulmonary abnormality.   Electronically Signed   By: Genevie Ann M.D.   On: 08/26/2015 08:14   I have personally reviewed and evaluated these images and lab results as part of my medical decision-making.   EKG Interpretation   Date/Time:  Thursday August 26 2015 07:20:56 EDT Ventricular Rate:  78 PR Interval:  160 QRS Duration: 85 QT Interval:  400 QTC Calculation: 456 R Axis:   83 Text Interpretation:  Sinus rhythm Borderline right axis deviation  Probable anteroseptal infarct, old No significant change since last  tracing Confirmed by Maxmillian Carsey MD, Shardee Dieu [phone]) on 08/26/2015 7:24:14 AM      MDM   Final diagnoses:  Essential hypertension  Midline thoracic back pain  Shortness of breath   69 year old female with multiple cardiac risk factors who presents 2 weeks upper back pain and sob. She appears well and VS aside from HTN 190s SBP is stable. Lungs clear. Not fluid overloaded and no history of CHF. Exam otherwise unremarkable. EKG non-ischemic.  Despite treatment for bronchitis, continued symptoms and no other symptoms of cough, congestion, or other URI symptoms during illness. C/f atypical ACS presentation. D-dimer is negative and ruled out for PE as she has no major risk factors. 2 weeks of ongoing symptoms, and looks well, I doubt that this is dissection. Back pain also reported worse with movement, and likely musculoskeletal in nature. Troponin negative, and chest x-ray showing no acute cardiopulmonary processes. Given her significant risk factors and a heart score 4, I am concerned that with her hypertension her  shortness of breath with minimal exertion may be an anginal equivalent. Will admit to observation for cardiac rule out under telemetry. Discussed with [facility].   Forde Dandy, MD 08/26/15 [phone]

## 2015-08-27 DIAGNOSIS — M5489 Other dorsalgia: Secondary | ICD-10-CM

## 2015-08-27 DIAGNOSIS — R51 Headache: Secondary | ICD-10-CM

## 2015-08-27 DIAGNOSIS — I10 Essential (primary) hypertension: Secondary | ICD-10-CM

## 2015-08-27 DIAGNOSIS — R0602 Shortness of breath: Secondary | ICD-10-CM | POA: Diagnosis not present

## 2015-08-27 LAB — TROPONIN I: Troponin I: <0.03 ng/mL (ref <0.031)

## 2015-08-27 MED ORDER — HYDROCODONE-ACETAMINOPHEN 5-325 MG PO TABS: 1.0000 | ORAL_TABLET | Freq: Four times a day (QID) | ORAL | Status: DC | PRN

## 2015-08-27 NOTE — Progress Notes (Signed)
Discharge instruction reviewed with patient. Prescription given to patient. No distress noted. 

## 2015-08-27 NOTE — Care Management Note (Signed)
Case Management Note  Patient Details  Name: Katherine Larson MRN: [id number] Date of Birth: Nov 26, 1945  Expected Discharge Date:  08/28/15               Expected Discharge Plan:  Home/Self Care  In-House Referral:  NA  Discharge planning Services  CM Consult  Post Acute Care Choice:  NA Choice offered to:  NA  DME Arranged:    DME Agency:     HH Arranged:    [place] Agency:     Status of Service:  Completed, signed off  Medicare Important Message Given:    Date Medicare IM Given:    Medicare IM give by:    Date Additional Medicare IM Given:    Additional Medicare Important Message give by:     If discussed at [place] of Stay Meetings, dates discussed:    Additional Comments: Admitted with back pain, pt is from home, lives with her husband and is ind at baseline. Pt has no HH services or DME's prior to admission. Pt plans to return home with self care. Pt hopeful she will be discharged today. No CM needs noted.  Sherald Barge, RN 08/27/2015, 11:24 AM

## 2015-08-27 NOTE — Discharge Summary (Signed)
Physician Discharge Summary  ANAGABRIELA JOKERST VVO:[id number] DOB: 1946-08-25 DOA: 08/26/2015  PCP: [name], MD  Admit date: 08/26/2015 Discharge date: 08/27/2015  Time spent: 35 minutes  Recommendations for Outpatient Follow-up:  1. Follow up with PCP in 1-2 weeks.   Discharge Diagnoses:  Principal Problem:   Back pain Active Problems:   Coronary atherosclerosis of native coronary artery   DYSPNEA   Headache   HTN (hypertension)   DM type 2 (diabetes mellitus, type 2) ([place])   Discharge Condition: Improved  Diet recommendation: Heart healthy  Filed Weights   08/26/15 0646  Weight: 73.936 kg (163 lb)    History of present illness:  69 year old female with a hx of HTN, DM type 2, CAD s/p CABG, and HLD presented with headache, elevated blood pressure, back pain and SOB. While in the ED, patient was noted to be hypertensive at 197/82. Otherwise, her vitals were stable. Labs revealed a mild leukocytosis at 11.1, negative D-dimer and negative troponin. CXR was unremarkable. She was referred for observation for back pain and possible anginal equivalent.   Hospital Course:  69 y/o female with a recent diagnosis of bronchitis and pleurisy presented with back pain and SOB. Patient reported her back pain began shortly after she started coughing. CXR revealed no acute abnormality. She remained afebrile with a mildly elevated WBC. SOB and thoracic back pain improved significantly with analgesics and albuterol inhaler. Patient ruled out for ACS. EKG was non-acute, serial troponins and d-dimer were negative.  Will continue to treat supportively. Patient feels significantly improved and is ready for discharge home.   Frontal HA likely related to significantly elevated blood pressure. HA improved in correlation with BP normalizing. No acute neurological deficits.   1. Accelerated HTN, likely due to increased pain. Resolved. 2. DM type 2, stable.  3. CAD, s/p CABG   Procedures:    none  Consultations:   none  Discharge Exam: Filed Vitals:   08/27/15 0629  BP: 127/62  Pulse: 75  Temp: 98.7 F (37.1 C)  Resp: 16     General: NAD, looks comfortable  Cardiovascular: RRR, S1, S2   Respiratory: clear bilaterally, No wheezing, rales or rhonchi  Abdomen: soft, non tender, no distention , bowel sounds normal  Musculoskeletal: No edema b/l  Discharge Instructions   Discharge Instructions    Diet - low sodium heart healthy    Complete by:  As directed      Increase activity slowly    Complete by:  As directed           Current Discharge Medication List    START taking these medications   Details  HYDROcodone-acetaminophen (NORCO/VICODIN) 5-325 MG tablet Take 1 tablet by mouth every 6 (six) hours as needed for moderate pain. Qty: 30 tablet, Refills: 0      CONTINUE these medications which have NOT CHANGED   Details  albuterol (PROVENTIL HFA;VENTOLIN HFA) 108 (90 BASE) MCG/ACT inhaler Inhale 2 puffs into the lungs every 6 (six) hours as needed for wheezing or shortness of breath.    allopurinol (ZYLOPRIM) 300 MG tablet Take 300 mg by mouth daily.    amitriptyline (ELAVIL) 10 MG tablet Take 20 mg by mouth at bedtime.     atorvastatin (LIPITOR) 80 MG tablet Take 80 mg by mouth daily.    fexofenadine (ALLEGRA) 180 MG tablet Take 180 mg by mouth daily.      fluticasone (FLONASE) 50 MCG/ACT nasal spray Place 2 sprays into both nostrils daily.  furosemide (LASIX) 20 MG tablet Take 20 mg by mouth daily.    hydroxychloroquine (PLAQUENIL) 200 MG tablet Take 400 mg by mouth daily.    iron polysaccharides (NIFEREX) 150 MG capsule Take 150 mg by mouth daily.    leflunomide (ARAVA) 20 MG tablet Take 20 mg by mouth daily.    metoCLOPramide (REGLAN) 5 MG tablet Take 10 mg by mouth at bedtime.     metoprolol tartrate (LOPRESSOR) 25 MG tablet Take 3 tablets (75 mg total) by mouth 2 (two) times daily. Qty: 180 tablet, Refills: 3     mometasone-formoterol (DULERA) 100-5 MCG/ACT AERO Inhale 2 puffs into the lungs 2 (two) times daily.    montelukast (SINGULAIR) 10 MG tablet Take 10 mg by mouth at bedtime.    Multiple Vitamins-Minerals (MACULAR VITAMIN BENEFIT PO) Take 2 tablets by mouth daily.    naproxen (NAPROSYN) 500 MG tablet Take 500 mg by mouth 2 (two) times daily with a meal.    nitroGLYCERIN (NITROSTAT) 0.4 MG SL tablet Place 0.4 mg under the tongue every 5 (five) minutes as needed for chest pain.    Omega-3 Fatty Acids (FISH OIL) 1000 MG CAPS Take 1,000 mg by mouth 3 (three) times daily with meals.    pantoprazole (PROTONIX) 40 MG tablet Take 40 mg by mouth 2 (two) times daily.    Polyethyl Glycol-Propyl Glycol (SYSTANE OP) Apply 1 drop to eye 3 (three) times daily as needed (Dry Eyes).    triamcinolone cream (KENALOG) 0.1 % Apply 1 application topically 2 (two) times daily.       Allergies  Allergen Reactions  . Biaxin [Clarithromycin] Rash and Other (See Comments)    Blisters in mouth   . Penicillins Rash and Other (See Comments)    Blisters in mouth       The results of significant diagnostics from this hospitalization (including imaging, microbiology, ancillary and laboratory) are listed below for reference.    Significant Diagnostic Studies: Dg Chest 2 View  08/26/2015   CLINICAL DATA:  69 year old female with shortness of breath, headache, pleurisy. Initial encounter.  EXAM: CHEST  2 VIEW  COMPARISON:  07/2015 and earlier.  FINDINGS: Stable lung volumes. Sequelae of CABG. Normal cardiac size and mediastinal contours. Visualized tracheal air column is within normal limits. No pneumothorax, pulmonary edema, pleural effusion or acute pulmonary opacity. Cholecystectomy and other epigastric surgical clips appears stable. Partially visible lumbar fusion hardware. Osteopenia. Chronic anterior right lower rib fractures. No acute osseous abnormality identified. Calcified atherosclerosis of the aorta.   IMPRESSION: No acute cardiopulmonary abnormality.   Electronically Signed   By: Genevie Ann M.D.   On: 08/26/2015 08:14       Labs: Basic Metabolic Panel:  Recent Labs Lab 08/26/15 0750  NA 138  K 3.3*  CL 106  CO2 24  GLUCOSE 126*  BUN 14  CREATININE 0.54  CALCIUM 8.0*    CBC:  Recent Labs Lab 08/26/15 0750  WBC 11.1*  NEUTROABS 7.7  HGB 12.4  HCT 37.4  MCV 94.0  PLT 198   Cardiac Enzymes:  Recent Labs Lab 08/26/15 1532 08/26/15 2028 08/27/15 0314  TROPONINI <0.03 <0.03 <0.03     Signed:  Jolaine Artist Memon. MD  [facility] 08/27/2015, 12:11 PM    By signing my name below, I, Rosalie Doctor, attest that this documentation has been prepared under the direction and in the presence of [facility]. MD Electronically Signed: Rosalie Doctor, Scribe. 08/27/2015 11:49am  I, Dr. Kathie Dike, personally performed the  services described in this documentaiton. All medical record entries made by the scribe were at my direction and in my presence. I have reviewed the chart and agree that the record reflects my personal performance and is accurate and complete  Kathie Dike, MD, 08/27/2015 12:13 PM

## 2015-09-02 ENCOUNTER — Other Ambulatory Visit: Payer: Self-pay | Admitting: Family Medicine

## 2015-09-02 ENCOUNTER — Ambulatory Visit
Admission: RE | Admit: 2015-09-02 | Discharge: 2015-09-02 | Disposition: A | Discharge status: Home / Self Care | Payer: Medicare Other | Source: Ambulatory Visit | Attending: Family Medicine | Admitting: Family Medicine

## 2015-09-02 DIAGNOSIS — R0781 Pleurodynia: Secondary | ICD-10-CM

## 2015-09-02 MED ORDER — IOPAMIDOL (ISOVUE-370) INJECTION 76%
80.0000 mL | Freq: Once | INTRAVENOUS | Status: AC | PRN
  Administered 2015-09-02: 80 mL via INTRAVENOUS

## 2015-09-11 ENCOUNTER — Other Ambulatory Visit: Payer: Self-pay | Admitting: Cardiovascular Disease

## 2015-09-15 ENCOUNTER — Other Ambulatory Visit: Payer: Self-pay | Admitting: Orthopedic Surgery

## 2015-09-15 DIAGNOSIS — M545 Low back pain, unspecified: Secondary | ICD-10-CM

## 2015-09-16 ENCOUNTER — Ambulatory Visit
Admission: RE | Admit: 2015-09-16 | Discharge: 2015-09-16 | Disposition: A | Discharge status: Home / Self Care | Payer: Medicare Other | Source: Ambulatory Visit | Attending: Orthopedic Surgery | Admitting: Orthopedic Surgery

## 2015-09-16 DIAGNOSIS — M545 Low back pain, unspecified: Secondary | ICD-10-CM

## 2015-09-21 ENCOUNTER — Other Ambulatory Visit: Payer: Self-pay | Admitting: Orthopedic Surgery

## 2015-09-21 DIAGNOSIS — IMO0002 Reserved for concepts with insufficient information to code with codable children: Secondary | ICD-10-CM

## 2015-11-01 ENCOUNTER — Other Ambulatory Visit (HOSPITAL_BASED_OUTPATIENT_CLINIC_OR_DEPARTMENT_OTHER): Payer: Medicare Other

## 2015-11-01 ENCOUNTER — Other Ambulatory Visit: Payer: Self-pay | Admitting: Family Medicine

## 2015-11-01 DIAGNOSIS — D539 Nutritional anemia, unspecified: Secondary | ICD-10-CM

## 2015-11-01 DIAGNOSIS — N644 Mastodynia: Secondary | ICD-10-CM

## 2015-11-01 DIAGNOSIS — D638 Anemia in other chronic diseases classified elsewhere: Secondary | ICD-10-CM

## 2015-11-01 LAB — CBC WITH DIFFERENTIAL/PLATELET
BASO%: 0.5 % (ref 0.0–2.0)
Basophils Absolute: 0 10*3/uL (ref 0.0–0.1)
EOS%: 4.3 % (ref 0.0–7.0)
Eosinophils Absolute: 0.4 10*3/uL (ref 0.0–0.5)
HCT: 37.4 % (ref 34.8–46.6)
HGB: 12.4 g/dL (ref 11.6–15.9)
LYMPH%: 34.9 % (ref 14.0–49.7)
MCH: 30.4 pg (ref 25.1–34.0)
MCHC: 33.2 g/dL (ref 31.5–36.0)
MCV: 91.7 fL (ref 79.5–101.0)
MONO#: 0.7 10*3/uL (ref 0.1–0.9)
MONO%: 8.3 % (ref 0.0–14.0)
NEUT#: 4.3 10*3/uL (ref 1.5–6.5)
NEUT%: 52 % (ref 38.4–76.8)
Platelets: 205 10*3/uL (ref 145–400)
RBC: 4.08 10*6/uL (ref 3.70–5.45)
RDW: 15 % — ABNORMAL HIGH (ref 11.2–14.5)
WBC: 8.3 10*3/uL (ref 3.9–10.3)
lymph#: 2.9 10*3/uL (ref 0.9–3.3)

## 2015-11-01 LAB — FERRITIN: Ferritin: 211 ng/ml (ref 9–269)

## 2015-11-01 LAB — IRON AND TIBC
%SAT: 30 % (ref 21–57)
Iron: 115 ug/dL (ref 41–142)
TIBC: 380 ug/dL (ref 236–444)
UIBC: 265 ug/dL (ref 120–384)

## 2015-11-05 ENCOUNTER — Ambulatory Visit
Admission: RE | Admit: 2015-11-05 | Discharge: 2015-11-05 | Disposition: A | Discharge status: Home / Self Care | Payer: Medicare Other | Source: Ambulatory Visit | Attending: Family Medicine | Admitting: Family Medicine

## 2015-11-05 DIAGNOSIS — N644 Mastodynia: Secondary | ICD-10-CM

## 2015-11-08 ENCOUNTER — Encounter: Payer: Self-pay | Admitting: Internal Medicine

## 2015-11-08 ENCOUNTER — Ambulatory Visit (HOSPITAL_BASED_OUTPATIENT_CLINIC_OR_DEPARTMENT_OTHER): Payer: Medicare Other | Admitting: Internal Medicine

## 2015-11-08 VITALS — BP 132/60 | HR 84 | Temp 98.2°F | Resp 17 | Ht 64.5 in | Wt 159.3 lb

## 2015-11-08 DIAGNOSIS — G8929 Other chronic pain: Secondary | ICD-10-CM | POA: Diagnosis not present

## 2015-11-08 DIAGNOSIS — M545 Low back pain: Secondary | ICD-10-CM

## 2015-11-08 DIAGNOSIS — D638 Anemia in other chronic diseases classified elsewhere: Secondary | ICD-10-CM | POA: Diagnosis not present

## 2015-11-08 DIAGNOSIS — D509 Iron deficiency anemia, unspecified: Secondary | ICD-10-CM

## 2015-11-08 NOTE — Progress Notes (Signed)
[facility] Telephone:([phone]) [phone]   Fax:([phone]) [place], [place] [phone]  DIAGNOSIS:  #1 reactive leukocytosis. Resloved.  #2 anemia of chronic disease plus/minus iron deficiency.   PRIOR THERAPY: None   CURRENT THERAPY:Nu-Iron 150 mg by mouth daily.   INTERVAL HISTORY: Katherine Larson 69 y.o. female returns to the clinic today for followup visit accompanied by her husband. The patient is feeling fine today with no specific complaints except for the chronic back pain. She has improvement in her fatigue. She is currently on Nu-Iron and is tolerating her treatment with oral iron tablet fairly well with no significant adverse effects. She denied having any bleeding issues, bruises or ecchymosis She denied having any weight loss or night sweats. She has no palpable lymphadenopathy. She had repeat CBC and iron study performed recently and she is here for evaluation and discussion of her lab results.  MEDICAL HISTORY: Past Medical History  Diagnosis Date  . NSVT (nonsustained ventricular tachycardia) ([place])   . DM2 (diabetes mellitus, type 2) ([place])   . CAD (coronary artery disease)     s/p CABG 1996;  Myoview 3/11: mod partially reversible AS perfusion defect, most likely related to variable breast attenuation although an element of scar with peri-infarct ischemia also possible, EF 54% - felt to be low risk  . HTN (hypertension)   . HLD (hyperlipidemia)   . Asthma   . Neuropathy ([place])   . Complication of anesthesia   . PONV (postoperative nausea and vomiting)   . Shortness of breath     with activity  . Seasonal allergies   . GERD (gastroesophageal reflux disease)   . Constipation   . Diarrhea   . Arthritis     RA  . Gout     ALLERGIES:  is allergic to biaxin and penicillins.  MEDICATIONS:  Current Outpatient Prescriptions  Medication Sig Dispense Refill  . albuterol  (PROVENTIL HFA;VENTOLIN HFA) 108 (90 BASE) MCG/ACT inhaler Inhale 2 puffs into the lungs every 6 (six) hours as needed for wheezing or shortness of breath.    . allopurinol (ZYLOPRIM) 300 MG tablet Take 300 mg by mouth daily.    Marland Kitchen amitriptyline (ELAVIL) 10 MG tablet Take 20 mg by mouth at bedtime.     Marland Kitchen atorvastatin (LIPITOR) 80 MG tablet Take 80 mg by mouth daily.    . fexofenadine (ALLEGRA) 180 MG tablet Take 180 mg by mouth daily.      . fluticasone (FLONASE) 50 MCG/ACT nasal spray Place 2 sprays into both nostrils daily.    . furosemide (LASIX) 20 MG tablet Take 20 mg by mouth daily.    . hydroxychloroquine (PLAQUENIL) 200 MG tablet Take 400 mg by mouth daily.    . iron polysaccharides (NIFEREX) 150 MG capsule Take 150 mg by mouth daily.    Marland Kitchen leflunomide (ARAVA) 20 MG tablet Take 20 mg by mouth daily.    . metoCLOPramide (REGLAN) 5 MG tablet Take 10 mg by mouth at bedtime.     . metoprolol tartrate (LOPRESSOR) 25 MG tablet TAKE 3 TABLETS(75 MG) BY MOUTH TWICE DAILY 180 tablet 8  . mometasone-formoterol (DULERA) 100-5 MCG/ACT AERO Inhale 2 puffs into the lungs 2 (two) times daily.    . montelukast (SINGULAIR) 10 MG tablet Take 10 mg by mouth at bedtime.    . Multiple Vitamins-Minerals (MACULAR VITAMIN BENEFIT PO) Take 2 tablets by mouth daily.    Marland Kitchen  Omega-3 Fatty Acids (FISH OIL) 1000 MG CAPS Take 1,000 mg by mouth 3 (three) times daily with meals.    Marland Kitchen oxyCODONE-acetaminophen (PERCOCET/ROXICET) 5-325 MG tablet   0  . pantoprazole (PROTONIX) 40 MG tablet Take 40 mg by mouth 2 (two) times daily.    Vladimir Faster Glycol-Propyl Glycol (SYSTANE OP) Apply 1 drop to eye 3 (three) times daily as needed (Dry Eyes).    . triamcinolone cream (KENALOG) 0.1 % Apply 1 application topically 2 (two) times daily.    . nitroGLYCERIN (NITROSTAT) 0.4 MG SL tablet Place 0.4 mg under the tongue every 5 (five) minutes as needed for chest pain. Reported on 11/08/2015     No current facility-administered medications  for this visit.    SURGICAL HISTORY:  Past Surgical History  Procedure Laterality Date  . Cholecystectomy    . Vesicovaginal fistula closure w/ tah    . Ptca    . Coronary artery bypass graft  1996    2 vessels  . Hernia repair      hital hernia  . Eye surgery Bilateral   . Abdominal hysterectomy    . Fracture surgery Right     arm  . Maximum access (mas)posterior lumbar interbody fusion (plif) 1 level N/A 08/14/2013    Procedure:  MAXIMUM ACCESS SURGERY(MAS) POSTERIOR LUMBAR INTERBODY FUSION LUMBAR THREE-FOUR ;  Surgeon: Eustace Moore, MD;  Location: [facility];  Service: Neurosurgery;  Laterality: N/A;   MAXIMUM ACCESS SURGERY(MAS) POSTERIOR LUMBAR INTERBODY FUSION LUMBAR THREE-FOUR   . Appendectomy      REVIEW OF SYSTEMS:  A comprehensive review of systems was negative except for: Musculoskeletal: positive for back pain   PHYSICAL EXAMINATION: General appearance: alert, cooperative, fatigued and no distress Head: Normocephalic, without obvious abnormality, atraumatic Neck: no adenopathy Lymph nodes: Cervical, supraclavicular, and axillary nodes normal. Resp: clear to auscultation bilaterally Cardio: regular rate and rhythm, S1, S2 normal, no murmur, click, rub or gallop GI: soft, non-tender; bowel sounds normal; no masses,  no organomegaly Extremities: extremities normal, atraumatic, no cyanosis or edema  ECOG PERFORMANCE STATUS: 1 - Symptomatic but completely ambulatory  Blood pressure 132/60, pulse 84, temperature 98.2 F (36.8 C), temperature source Oral, resp. rate 17, height 5' 4.5" (1.638 m), weight 159 lb 4.8 oz (72.258 kg), SpO2 97 %.  LABORATORY DATA: Lab Results  Component Value Date   WBC 8.3 11/01/2015   HGB 12.4 11/01/2015   HCT 37.4 11/01/2015   MCV 91.7 11/01/2015   PLT 205 11/01/2015      Chemistry      Component Value Date/Time   NA 138 08/26/2015 0750   NA 136 12/10/2013 1004   K 3.3* 08/26/2015 0750   K 4.8 12/10/2013 1004   CL 106  08/26/2015 0750   CL 102 12/17/2012 0953   CO2 24 08/26/2015 0750   CO2 23 12/10/2013 1004   BUN 14 08/26/2015 0750   BUN 33.8* 12/10/2013 1004   CREATININE 0.54 08/26/2015 0750   CREATININE 0.9 12/10/2013 1004   CREATININE 0.44* 07/16/2013 1228      Component Value Date/Time   CALCIUM 8.0* 08/26/2015 0750   CALCIUM 9.8 12/10/2013 1004   ALKPHOS 36* 12/10/2013 1004   ALKPHOS 35* 08/24/2008 1510   AST 17 12/10/2013 1004   AST 23 08/24/2008 1510   ALT 15 12/10/2013 1004   ALT 18 08/24/2008 1510   BILITOT 0.37 12/10/2013 1004   BILITOT 0.7 08/24/2008 1510       BONE MARROW REPORT  FINAL DIAGNOSIS RADIOGRAPHIC STUDIES:  ASSESSMENT AND PLAN: This is a very pleasant 69 years old white female with anemia of chronic disease plus/minus deficiency currently on Nu-iron and tolerating it fairly well.  Her recent CBC showed normal hemoglobin and hematocrit and  normal iron study. I recommended for her to continue on the iron tablets for now. The patient has been doing fine and recommended for her to continue her routine follow-up visit with her primary care physician at this point. I'll be happy to see her in the future if needed. She was advised to call immediately if she has any concerning symptoms in the interval. All questions were answered. The patient knows to call the clinic with any problems, questions or concerns. We can certainly see the patient much sooner if necessary.  Disclaimer: This note was dictated with voice recognition software. Similar sounding words can inadvertently be transcribed and may not be corrected upon review.

## 2015-12-08 ENCOUNTER — Encounter: Payer: Self-pay | Admitting: Gastroenterology

## 2015-12-08 ENCOUNTER — Ambulatory Visit (INDEPENDENT_AMBULATORY_CARE_PROVIDER_SITE_OTHER): Payer: Medicare Other | Admitting: Gastroenterology

## 2015-12-08 VITALS — BP 120/71 | HR 83 | Temp 97.8°F | Ht 64.5 in | Wt 151.7 lb

## 2015-12-08 DIAGNOSIS — R933 Abnormal findings on diagnostic imaging of other parts of digestive tract: Secondary | ICD-10-CM

## 2015-12-08 DIAGNOSIS — K5900 Constipation, unspecified: Secondary | ICD-10-CM | POA: Diagnosis not present

## 2015-12-08 DIAGNOSIS — R1314 Dysphagia, pharyngoesophageal phase: Secondary | ICD-10-CM

## 2015-12-08 DIAGNOSIS — R1013 Epigastric pain: Secondary | ICD-10-CM | POA: Diagnosis not present

## 2015-12-08 DIAGNOSIS — D649 Anemia, unspecified: Secondary | ICD-10-CM

## 2015-12-08 DIAGNOSIS — K219 Gastro-esophageal reflux disease without esophagitis: Secondary | ICD-10-CM | POA: Diagnosis not present

## 2015-12-08 DIAGNOSIS — R1310 Dysphagia, unspecified: Secondary | ICD-10-CM | POA: Insufficient documentation

## 2015-12-08 DIAGNOSIS — R1030 Lower abdominal pain, unspecified: Secondary | ICD-10-CM

## 2015-12-08 DIAGNOSIS — R634 Abnormal weight loss: Secondary | ICD-10-CM

## 2015-12-08 DIAGNOSIS — R1319 Other dysphagia: Secondary | ICD-10-CM | POA: Insufficient documentation

## 2015-12-08 LAB — CBC WITH DIFFERENTIAL/PLATELET
Basophils Absolute: 0 10*3/uL (ref 0.0–0.1)
Basophils Relative: 0 % (ref 0–1)
Eosinophils Absolute: 0.3 10*3/uL (ref 0.0–0.7)
Eosinophils Relative: 3 % (ref 0–5)
HCT: 38.2 % (ref 36.0–46.0)
Hemoglobin: 12.7 g/dL (ref 12.0–15.0)
Lymphocytes Relative: 31 % (ref 12–46)
Lymphs Abs: 2.9 10*3/uL (ref 0.7–4.0)
MCH: 30.9 pg (ref 26.0–34.0)
MCHC: 33.2 g/dL (ref 30.0–36.0)
MCV: 92.9 fL (ref 78.0–100.0)
MPV: 10 fL (ref 8.6–12.4)
Monocytes Absolute: 0.8 10*3/uL (ref 0.1–1.0)
Monocytes Relative: 9 % (ref 3–12)
Neutro Abs: 5.3 10*3/uL (ref 1.7–7.7)
Neutrophils Relative %: 57 % (ref 43–77)
Platelets: 258 10*3/uL (ref 150–400)
RBC: 4.11 MIL/uL (ref 3.87–5.11)
RDW: 14.3 % (ref 11.5–15.5)
WBC: 9.3 10*3/uL (ref 4.0–10.5)

## 2015-12-08 MED ORDER — LUBIPROSTONE 24 MCG PO CAPS
24.0000 ug | ORAL_CAPSULE | Freq: Two times a day (BID) | ORAL | Status: DC
Start: 2015-12-08 — End: 2015-12-29

## 2015-12-08 MED ORDER — OMEPRAZOLE 20 MG PO CPDR: 20.0000 mg | DELAYED_RELEASE_CAPSULE | Freq: Two times a day (BID) | ORAL | Status: DC

## 2015-12-08 NOTE — Patient Instructions (Addendum)
1. We will retrieve copy of your records and decide next step.  2. In meantime, please stop pantoprazole and dulcolax.  3. Start Amitiza 84mcg twice daily with food. Samples provided. rx sent to pharmacy. 4. Start omeprazole twice daily 30 minutes before a meal for GERD. RX sent to pharmacy.  5. Please have additional labs done.

## 2015-12-08 NOTE — Progress Notes (Signed)
Primary Care Physician:  [name], MD  Primary Gastroenterologist:  Garfield Cornea, MD   Chief Complaint  Patient presents with  . Abdominal Pain  . Gastroesophageal Reflux  . Nausea    no vomiting  . Constipation    HPI:  Katherine Larson is a 70 y.o. female here as a new patient for further evaluation of abdominal pain, GERD, constipation, nausea at the request of Dr. Dema Severin with [facility].   Patient has previously been followed by Dr. Watt Climes and [name] for at least 10 years. Patient states that he will no longer see her. Her records were sent to [facility] but they declined to accept her as a new patient.   We have requested her GI records but have not received at time of her appointment today. She gives a history of chronic GERD, anemia. She has a history of anemia mixed with IDA, previously required iron infusions by Dr. Earlie Server, last infusion about one year ago, has been followed closely currently on oral supplements. Previous workup reportedly included EGD, colonoscopy, Givens capsule endoscopy.  Patient reports chronic GERD. She states her symptoms are refractory to therapy. She takes pantoprazole 40 mg twice daily but also takes Reglan 5-10 mg at bedtime. She reports starting Reglan about 13 years ago under the direction of her PCP for nighttime reflux. One year ago she states she was having diarrhea. She saw Dr. Watt Climes who provide her medication which caused constipation. Constipation has been worse over the past 3-4 months. Started narcotics for back pain back in the fall of last year. Takes four pain pills daily. Currently utilizing Dulcolax daily. Previously failed MiraLAX. Denies blood in the stool or melena. With regards to her reflux she has only and on pantoprazole. Has not tried any other PPIs. She reports a 50 pound weight loss in the last 6 months. States she is not able to eat. Complains of postprandial epigastric pain and lower abdominal pain. Nausea without  vomiting. Complains of solid food dysphagia especially meat and bread. Pretty much avoids these foods. Complains of soreness in the epigastrium and radiating into both lower ribs. Denies dysuria.  She has been suffering from back pain. Between back pain and abdominal pain she gets woken up every night. States that there are no surgical fixes. She wonders if she is rejecting the hardware in her spine. She states she rejected cardiac stent previously and is subsequently had a coronary artery bypass grafting. Is going to pain management clinic tomorrow to establish care.     Current Outpatient Prescriptions  Medication Sig Dispense Refill  . albuterol (PROVENTIL HFA;VENTOLIN HFA) 108 (90 BASE) MCG/ACT inhaler Inhale 2 puffs into the lungs every 6 (six) hours as needed for wheezing or shortness of breath.    . allopurinol (ZYLOPRIM) 300 MG tablet Take 300 mg by mouth daily.    Marland Kitchen amitriptyline (ELAVIL) 10 MG tablet Take 20 mg by mouth at bedtime.     Marland Kitchen atorvastatin (LIPITOR) 80 MG tablet Take 80 mg by mouth daily.    . fexofenadine (ALLEGRA) 180 MG tablet Take 180 mg by mouth daily.      . fluticasone (FLONASE) 50 MCG/ACT nasal spray Place 2 sprays into both nostrils daily.    . furosemide (LASIX) 20 MG tablet Take 20 mg by mouth daily.    . hydroxychloroquine (PLAQUENIL) 200 MG tablet Take 400 mg by mouth daily.    . iron polysaccharides (NIFEREX) 150 MG capsule Take 150 mg by mouth daily.    Marland Kitchen  leflunomide (ARAVA) 20 MG tablet Take 20 mg by mouth daily.    Marland Kitchen losartan (COZAAR) 100 MG tablet Take 100 mg by mouth daily.    . metoCLOPramide (REGLAN) 5 MG tablet Take 10 mg by mouth at bedtime.     . metoprolol tartrate (LOPRESSOR) 25 MG tablet TAKE 3 TABLETS(75 MG) BY MOUTH TWICE DAILY 180 tablet 8  . mometasone-formoterol (DULERA) 100-5 MCG/ACT AERO Inhale 2 puffs into the lungs 2 (two) times daily.    . montelukast (SINGULAIR) 10 MG tablet Take 10 mg by mouth at bedtime.    . Multiple  Vitamins-Minerals (MACULAR VITAMIN BENEFIT PO) Take 2 tablets by mouth daily.    . nitroGLYCERIN (NITROSTAT) 0.4 MG SL tablet Place 0.4 mg under the tongue every 5 (five) minutes as needed for chest pain. Reported on 11/08/2015    . Omega-3 Fatty Acids (FISH OIL) 1000 MG CAPS Take 1,000 mg by mouth 3 (three) times daily with meals.    Marland Kitchen oxyCODONE-acetaminophen (PERCOCET/ROXICET) 5-325 MG tablet   0  . pantoprazole (PROTONIX) 40 MG tablet Take 40 mg by mouth 2 (two) times daily.    Vladimir Faster Glycol-Propyl Glycol (SYSTANE OP) Apply 1 drop to eye 3 (three) times daily as needed (Dry Eyes).    . triamcinolone cream (KENALOG) 0.1 % Apply 1 application topically 2 (two) times daily.     No current facility-administered medications for this visit.    Allergies as of 12/08/2015 - Review Complete 12/08/2015  Allergen Reaction Noted  . Biaxin [clarithromycin] Rash and Other (See Comments) 02/02/2010  . Penicillins Rash and Other (See Comments) 02/02/2010    Past Medical History  Diagnosis Date  . NSVT (nonsustained ventricular tachycardia) ([place])   . DM2 (diabetes mellitus, type 2) ([place])   . CAD (coronary artery disease)     s/p CABG 1996;  Myoview 3/11: mod partially reversible AS perfusion defect, most likely related to variable breast attenuation although an element of scar with peri-infarct ischemia also possible, EF 54% - felt to be low risk  . HTN (hypertension)   . HLD (hyperlipidemia)   . Asthma   . Neuropathy ([place])   . Complication of anesthesia   . PONV (postoperative nausea and vomiting)   . Shortness of breath     with activity  . Seasonal allergies   . GERD (gastroesophageal reflux disease)   . Constipation   . Diarrhea   . Rheumatoid arthritis ([facility])     RA  . Gout   . Anemia     anemia of chronic disease +/- IDA followed by Dr. Earlie Server. received iron infusions in the past    Past Surgical History  Procedure Laterality Date  . Cholecystectomy    . Ptca    . Coronary  artery bypass graft  1996    2 vessels  . Hernia repair      hiatal hernia  . Eye surgery Bilateral   . Abdominal hysterectomy    . Fracture surgery Right     arm  . Maximum access (mas)posterior lumbar interbody fusion (plif) 1 level N/A 08/14/2013    Procedure:  MAXIMUM ACCESS SURGERY(MAS) POSTERIOR LUMBAR INTERBODY FUSION LUMBAR THREE-FOUR ;  Surgeon: Eustace Moore, MD;  Location: [place] [facility];  Service: Neurosurgery;  Laterality: N/A;   MAXIMUM ACCESS SURGERY(MAS) POSTERIOR LUMBAR INTERBODY FUSION LUMBAR THREE-FOUR   . Appendectomy    . Colonoscopy  2013    Dr. Watt Climes  . Esophagogastroduodenoscopy  2013    Dr. Watt Climes  .  Givens capsule study      Dr. Watt Climes    Family History  Problem Relation Age of Onset  . Heart attack Father   . Heart attack Mother   . Colon cancer Neg Hx   . Bladder Cancer Brother   . Cervical cancer Daughter     ?    Social History   Social History  . Marital Status: Married    Spouse Name: N/A  . Number of Children: 3  . Years of Education: N/A   Occupational History  . Not on file.   Social History Main Topics  . Smoking status: Former Smoker -- 15 years    Quit date: 11/20/1994  . Smokeless tobacco: Not on file  . Alcohol Use: No  . Drug Use: No  . Sexual Activity: Not on file   Other Topics Concern  . Not on file   Social History Narrative   2 living children, one deceased age 57         ROS:  General: Negative for anorexia,  fever, chills, fatigue, weakness. See hpi Eyes: Negative for vision changes.  ENT: Negative for hoarseness,  nasal congestion. See hpi CV: Negative for chest pain, angina, palpitations, dyspnea on exertion, peripheral edema.  Respiratory: Negative for dyspnea at rest, dyspnea on exertion, cough, sputum, wheezing.  GI: See history of present illness. GU:  Negative for dysuria, hematuria, urinary incontinence, urinary frequency, nocturnal urination.  MS: +chronic joint and back pain.  Derm: Negative for  rash or itching.  Neuro: Negative for weakness, abnormal sensation, seizure, frequent headaches, memory loss, confusion.  Psych: Negative for anxiety, depression, suicidal ideation, hallucinations.  Endo: see hpi.  Heme: Negative for bruising or bleeding. Allergy: Negative for rash or hives.    Physical Examination:  BP 120/71 mmHg  Pulse 83  Temp(Src) 97.8 F (36.6 C) (Oral)  Ht 5' 4.5" (1.638 m)  Wt 151 lb 11.2 oz (68.811 kg)  BMI 25.65 kg/m2   General: Well-nourished, well-developed in no acute distress. Accompanied by spouse.  Head: Normocephalic, atraumatic.   Eyes: Conjunctiva pink, no icterus. Mouth: Oropharyngeal mucosa moist and pink , no lesions erythema or exudate. Neck: Supple without thyromegaly, masses, or lymphadenopathy.  Lungs: Clear to auscultation bilaterally.  Heart: Regular rate and rhythm, no murmurs rubs or gallops.  Abdomen: Bowel sounds are normal, mild epigastric and lower abdominal tenderness, nondistended, no hepatosplenomegaly or masses, no abdominal bruits or    hernia , no rebound or guarding.   Rectal: not performed Extremities: No lower extremity edema. No clubbing or deformities.  Neuro: Alert and oriented x 4 , grossly normal neurologically.  Skin: Warm and dry, no rash or jaundice.   Psych: Alert and cooperative, normal mood and affect.  Labs: September 2016 white blood cell count 8400, hemoglobin 12, hematocrit 35.2, MCV 92.1 back in August 2016 her hemoglobin was 11.26 June 2015 total bilirubin 0.6, alkaline phosphatase 51, AST 28, ALT 16, calcium 9.8, hemoglobin A1c 4.6, creatinine 0.91 February 2016, ferritin 119  Imaging Studies: 04/13/15 CT A/P W CM  CLINICAL DATA: Lower abdominal pain and bloating for 1 month.  EXAM: CT ABDOMEN AND PELVIS WITH CONTRAST  TECHNIQUE: Multidetector CT imaging of the abdomen and pelvis was performed using the standard protocol following bolus administration of intravenous  contrast.  CONTRAST: 19mL OMNIPAQUE IOHEXOL 300 MG/ML SOLN  COMPARISON: 08/15/2012  FINDINGS: Lower chest: Borderline wall thickening in the distal esophagus, similar to prior exam from 3 years ago. There is mild lingular  atelectasis.  Hepatobiliary: Cholecystectomy  Pancreas: Unremarkable  Spleen: Unremarkable  Adrenals/Urinary Tract: Benign 0.9 by 1.4 cm cyst posteriorly in the right mid kidney.  Stomach/Bowel: Transverse duodenal diverticulum just below the pancreatic head. Sigmoid colon diverticulosis.  Vascular/Lymphatic: Porta hepatis node, 1.1 cm on image 22 series 2, formerly 1.0 cm.  Aortoiliac atherosclerotic vascular disease. Portacaval node 1.4 cm, formerly 1.3 cm.  Reproductive: Uterus absent. Ovaries normal.  Other: No supplemental non-categorized findings.  Musculoskeletal: Moderate degenerative arthropathy of both hips. Mild chronic deformity of the pubic symphysis. Posterolateral rod and pedicle screw fixation at L3-4 with posterior decompression.  IMPRESSION: 1. No cause for the patient's lower abdominal pain and bloating is identified. 2. Various findings include chronic mild distal esophageal wall thickening (esophagitis not excluded); mild but essentially stable enlargement of a porta hepatis and portacaval lymph node; sigmoid diverticulosis without active diverticulitis ; moderate degenerative arthropathy of both hips; and mild chronic deformity of the pubic symphysis, possibly from remote trauma.   Electronically Signed  By: Van Clines M.D.  On: 04/13/2015 12:54  Impression/plan:  70 year old female who presents today transfer GI care. Previously followed by Dr. Watt Climes. We have requested records for review. She has a history of chronic anemia/IDA followed by hematology (Dr. Earlie Server). Previously required IV iron infusions. Sounds like she had an extensive workup including EGD, colonoscopy, small bowel capsule  endoscopy. Her hemoglobin has been stable. She has been on oral iron supplements. She presents with complaints of refractory GERD along with esophageal dysphagia to solid foods. Notably CT scan last year showed persistent chronic mild distal esophageal wall thickening seen previously on CT 3 years prior. Currently on pantoprazole twice a day and Reglan at bedtime with significant breakthrough symptoms. She enforces a 50 pound weight loss over the past 6 months which she relates to diminished oral intake in the setting of postprandial epigastric pain and nausea. She also notes chronic constipation has worsened with the addition of pain medication in the past 4 months.  Discussed at length with patient and spouse today. We will go ahead and make some medication changes to see if he can get her GERD and constipation under better control. Switch from pantoprazole to omeprazole 20 mg twice a day 30 minutes before a meal, Amitiza 24 g twice a day with food for constipation. Continue Reglan for now at bedtime. In the meantime we will review her records as they become available. We will update labs and screening for celiac as well as check lipase. At a minimum she will likely need EGD with dilation. Further recommendations to follow.

## 2015-12-09 LAB — COMPREHENSIVE METABOLIC PANEL
ALT: 14 U/L (ref 6–29)
AST: 17 U/L (ref 10–35)
Albumin: 4.2 g/dL (ref 3.6–5.1)
Alkaline Phosphatase: 79 U/L (ref 33–130)
BUN: 12 mg/dL (ref 7–25)
CO2: 26 mmol/L (ref 20–31)
Calcium: 9.4 mg/dL (ref 8.6–10.4)
Chloride: 102 mmol/L (ref 98–110)
Creat: 0.9 mg/dL (ref 0.50–0.99)
Glucose, Bld: 102 mg/dL — ABNORMAL HIGH (ref 65–99)
Potassium: 3.8 mmol/L (ref 3.5–5.3)
Sodium: 139 mmol/L (ref 135–146)
Total Bilirubin: 0.5 mg/dL (ref 0.2–1.2)
Total Protein: 6.4 g/dL (ref 6.1–8.1)

## 2015-12-09 LAB — LIPASE: Lipase: 15 U/L (ref 7–60)

## 2015-12-09 LAB — IGA: IgA: 171 mg/dL (ref 69–380)

## 2015-12-09 LAB — TSH: TSH: 0.571 u[IU]/mL (ref 0.350–4.500)

## 2015-12-10 LAB — TISSUE TRANSGLUTAMINASE, IGA: Tissue Transglutaminase Ab, IgA: 1 U/mL (ref <4)

## 2015-12-13 ENCOUNTER — Telehealth: Payer: Self-pay | Admitting: Internal Medicine

## 2015-12-13 NOTE — Telephone Encounter (Signed)
Pt is asking if she can get more samples of amitiza to hold her over until her insurance gets straighten out. Please call [phone] Patient also asking if Dr Watt Climes had faxed her records over to [place]. I think they have been, but will have to check.

## 2015-12-13 NOTE — Telephone Encounter (Signed)
PA was approved. Pt is aware. Katherine Larson said the records are here. Pt wants to know if she needs to come back for a follow up ov?

## 2015-12-14 ENCOUNTER — Ambulatory Visit: Payer: Medicare Other | Admitting: Gastroenterology

## 2015-12-14 ENCOUNTER — Other Ambulatory Visit: Payer: Self-pay

## 2015-12-14 ENCOUNTER — Encounter: Payer: Self-pay | Admitting: Gastroenterology

## 2015-12-14 DIAGNOSIS — R1319 Other dysphagia: Secondary | ICD-10-CM

## 2015-12-14 DIAGNOSIS — K219 Gastro-esophageal reflux disease without esophagitis: Secondary | ICD-10-CM

## 2015-12-14 DIAGNOSIS — R1310 Dysphagia, unspecified: Secondary | ICD-10-CM

## 2015-12-14 DIAGNOSIS — R634 Abnormal weight loss: Secondary | ICD-10-CM

## 2015-12-14 DIAGNOSIS — R1013 Epigastric pain: Secondary | ICD-10-CM

## 2015-12-14 DIAGNOSIS — K2289 Other specified disease of esophagus: Secondary | ICD-10-CM

## 2015-12-14 DIAGNOSIS — K228 Other specified diseases of esophagus: Secondary | ICD-10-CM

## 2015-12-14 NOTE — Progress Notes (Signed)
Katherine Larson is requesting records from [facility] for EGD and small bowel capsule report again

## 2015-12-14 NOTE — Telephone Encounter (Signed)
Noted. Records are here. I will review today and send addendum to OV note with instructions. She will not need follow up OV. We will plan on further work up first.

## 2015-12-14 NOTE — Progress Notes (Signed)
Reviewed records received from Dr. Perley Jain office. Received only colonoscopy from 2013 and pathology report from EGD from 2013. Past surgical history updated. Did not receive Givens capsule endoscopy.  Colonoscopy from 2013 showed hemorrhoids, diverticulosis, hyperplastic polyp. EGD path showed gastritis.   1. Recommend EGD with Dr. Gala Romney for refractory GERD, esophageal dysphagia, unintentional weight loss, epigastric pain, esophageal wall thickening on CT.Phenergan 12.5 mg 30 minutes before the procedure for polypharmacy. 2. Please let her know her labs look great! 3. Please request EGD report and small bowel capsule report again. We did not get it last time.

## 2015-12-14 NOTE — Progress Notes (Signed)
Quick Note:  See addendum to OV note. ______

## 2015-12-14 NOTE — Progress Notes (Signed)
Pt is aware and instructions sent to patient.   Pt is set for 01/05/2016 @ 0930

## 2015-12-15 NOTE — Progress Notes (Signed)
CC'D TO PCP °

## 2015-12-16 ENCOUNTER — Telehealth: Payer: Self-pay | Admitting: Internal Medicine

## 2015-12-16 NOTE — Telephone Encounter (Signed)
Pt was seen last week and called today to say that the medicine [facility] put her on (omperazole) wasn't helping her any. Please advise if there is anything else she can take and would we have samples? [phone]

## 2015-12-16 NOTE — Telephone Encounter (Signed)
She is on the schedule for EGD since this telephone note.

## 2015-12-16 NOTE — Telephone Encounter (Signed)
Lets give her Linzess 268mcg samples #10. Take one daily on empty stomach.

## 2015-12-16 NOTE — Telephone Encounter (Signed)
Routing to [facility] 

## 2015-12-16 NOTE — Telephone Encounter (Signed)
Pt aware. Any further testing?

## 2015-12-16 NOTE — Telephone Encounter (Signed)
We can give her #10 Dexilant 60mg  samples to take once daily before breakfast instead of the omeprazole. DOES NOT NEED TO TAKE TWICE DAILY.

## 2015-12-16 NOTE — Telephone Encounter (Signed)
Pt is aware. Samples are at the front desk. Pt said she forgot to ask about something else for constipation. She is taking amitiza 56mcg daily and hasnt had a bm in 4 days. She has taken dulcolax and miralax in the past.

## 2015-12-17 NOTE — Telephone Encounter (Signed)
Tried to call pt- # was busy

## 2015-12-20 NOTE — Telephone Encounter (Signed)
pts husband is aware. 

## 2015-12-22 ENCOUNTER — Telehealth: Payer: Self-pay

## 2015-12-22 ENCOUNTER — Other Ambulatory Visit: Payer: Self-pay

## 2015-12-22 MED ORDER — DEXLANSOPRAZOLE 60 MG PO CPDR: 60.0000 mg | DELAYED_RELEASE_CAPSULE | Freq: Every day | ORAL | Status: DC

## 2015-12-22 MED ORDER — SUCRALFATE 1 GM/10ML PO SUSP: 1.0000 g | Freq: Four times a day (QID) | ORAL | Status: DC

## 2015-12-22 NOTE — Telephone Encounter (Signed)
RX for Carafate and Dexilant sent to pharmacy.

## 2015-12-22 NOTE — Telephone Encounter (Signed)
LMOM to call back

## 2015-12-22 NOTE — Telephone Encounter (Signed)
Pt's husband called saying that she was getting worst. They wanted to move up her EGD from 01/05/16. I was able to move her to 12/29/15. They are aware of the new date and time. I will send out new instructions. She also need a RX of Dexilant send in to  [facility]. She said that it is not helping that much. Could she try something like Carafate to see if that would help? Please advise

## 2015-12-22 NOTE — Telephone Encounter (Signed)
Pt called back to make let know she received the message

## 2015-12-23 ENCOUNTER — Emergency Department (HOSPITAL_COMMUNITY): Payer: Medicare Other

## 2015-12-23 ENCOUNTER — Emergency Department (HOSPITAL_COMMUNITY)
Admission: EM | Admit: 2015-12-23 | Discharge: 2015-12-23 | Disposition: A | Discharge status: Home / Self Care | Payer: Medicare Other | Attending: Emergency Medicine | Admitting: Emergency Medicine

## 2015-12-23 ENCOUNTER — Encounter (HOSPITAL_COMMUNITY): Payer: Self-pay | Admitting: Cardiology

## 2015-12-23 DIAGNOSIS — Z880 Allergy status to penicillin: Secondary | ICD-10-CM | POA: Diagnosis not present

## 2015-12-23 DIAGNOSIS — E785 Hyperlipidemia, unspecified: Secondary | ICD-10-CM | POA: Insufficient documentation

## 2015-12-23 DIAGNOSIS — K219 Gastro-esophageal reflux disease without esophagitis: Secondary | ICD-10-CM | POA: Diagnosis not present

## 2015-12-23 DIAGNOSIS — M069 Rheumatoid arthritis, unspecified: Secondary | ICD-10-CM | POA: Diagnosis not present

## 2015-12-23 DIAGNOSIS — D649 Anemia, unspecified: Secondary | ICD-10-CM | POA: Insufficient documentation

## 2015-12-23 DIAGNOSIS — Z7952 Long term (current) use of systemic steroids: Secondary | ICD-10-CM | POA: Insufficient documentation

## 2015-12-23 DIAGNOSIS — E119 Type 2 diabetes mellitus without complications: Secondary | ICD-10-CM | POA: Insufficient documentation

## 2015-12-23 DIAGNOSIS — J45909 Unspecified asthma, uncomplicated: Secondary | ICD-10-CM | POA: Diagnosis not present

## 2015-12-23 DIAGNOSIS — Z951 Presence of aortocoronary bypass graft: Secondary | ICD-10-CM | POA: Diagnosis not present

## 2015-12-23 DIAGNOSIS — Z79899 Other long term (current) drug therapy: Secondary | ICD-10-CM | POA: Diagnosis not present

## 2015-12-23 DIAGNOSIS — I2510 Atherosclerotic heart disease of native coronary artery without angina pectoris: Secondary | ICD-10-CM | POA: Insufficient documentation

## 2015-12-23 DIAGNOSIS — K5900 Constipation, unspecified: Secondary | ICD-10-CM | POA: Diagnosis not present

## 2015-12-23 DIAGNOSIS — M109 Gout, unspecified: Secondary | ICD-10-CM | POA: Diagnosis not present

## 2015-12-23 DIAGNOSIS — I10 Essential (primary) hypertension: Secondary | ICD-10-CM | POA: Diagnosis not present

## 2015-12-23 DIAGNOSIS — G629 Polyneuropathy, unspecified: Secondary | ICD-10-CM | POA: Diagnosis not present

## 2015-12-23 DIAGNOSIS — Z87891 Personal history of nicotine dependence: Secondary | ICD-10-CM | POA: Diagnosis not present

## 2015-12-23 DIAGNOSIS — Z7951 Long term (current) use of inhaled steroids: Secondary | ICD-10-CM | POA: Insufficient documentation

## 2015-12-23 DIAGNOSIS — R1033 Periumbilical pain: Secondary | ICD-10-CM | POA: Diagnosis present

## 2015-12-23 LAB — CBC WITH DIFFERENTIAL/PLATELET
Basophils Absolute: 0 10*3/uL (ref 0.0–0.1)
Basophils Relative: 0 %
Eosinophils Absolute: 0.2 10*3/uL (ref 0.0–0.7)
Eosinophils Relative: 2 %
HCT: 38.8 % (ref 36.0–46.0)
Hemoglobin: 13.2 g/dL (ref 12.0–15.0)
Lymphocytes Relative: 20 %
Lymphs Abs: 1.9 10*3/uL (ref 0.7–4.0)
MCH: 30.8 pg (ref 26.0–34.0)
MCHC: 34 g/dL (ref 30.0–36.0)
MCV: 90.7 fL (ref 78.0–100.0)
Monocytes Absolute: 0.7 10*3/uL (ref 0.1–1.0)
Monocytes Relative: 7 %
Neutro Abs: 6.7 10*3/uL (ref 1.7–7.7)
Neutrophils Relative %: 71 %
Platelets: 241 10*3/uL (ref 150–400)
RBC: 4.28 MIL/uL (ref 3.87–5.11)
RDW: 13.9 % (ref 11.5–15.5)
WBC: 9.4 10*3/uL (ref 4.0–10.5)

## 2015-12-23 LAB — COMPREHENSIVE METABOLIC PANEL
ALT: 20 U/L (ref 14–54)
AST: 21 U/L (ref 15–41)
Albumin: 4.2 g/dL (ref 3.5–5.0)
Alkaline Phosphatase: 75 U/L (ref 38–126)
Anion gap: 11 (ref 5–15)
BUN: 12 mg/dL (ref 6–20)
CO2: 20 mmol/L — ABNORMAL LOW (ref 22–32)
Calcium: 9.2 mg/dL (ref 8.9–10.3)
Chloride: 105 mmol/L (ref 101–111)
Creatinine, Ser: 0.72 mg/dL (ref 0.44–1.00)
GFR calc Af Amer: >60 mL/min (ref >60)
GFR calc non Af Amer: >60 mL/min (ref >60)
Glucose, Bld: 129 mg/dL — ABNORMAL HIGH (ref 65–99)
Potassium: 4 mmol/L (ref 3.5–5.1)
Sodium: 136 mmol/L (ref 135–145)
Total Bilirubin: 0.9 mg/dL (ref 0.3–1.2)
Total Protein: 7 g/dL (ref 6.5–8.1)

## 2015-12-23 LAB — LIPASE, BLOOD: Lipase: 24 U/L (ref 11–51)

## 2015-12-23 MED ORDER — PANTOPRAZOLE SODIUM 40 MG IV SOLR
40.0000 mg | Freq: Once | INTRAVENOUS | Status: AC
  Administered 2015-12-23: 40 mg via INTRAVENOUS
  Filled 2015-12-23: qty 40

## 2015-12-23 MED ORDER — ONDANSETRON HCL 4 MG/2ML IJ SOLN
4.0000 mg | Freq: Once | INTRAMUSCULAR | Status: AC
  Administered 2015-12-23: 4 mg via INTRAVENOUS
  Filled 2015-12-23: qty 2

## 2015-12-23 MED ORDER — HYDROMORPHONE HCL 1 MG/ML IJ SOLN
1.0000 mg | Freq: Once | INTRAMUSCULAR | Status: AC
  Administered 2015-12-23: 1 mg via INTRAVENOUS
  Filled 2015-12-23: qty 1

## 2015-12-23 MED ORDER — HYDROCODONE-ACETAMINOPHEN 5-325 MG PO TABS: 1.0000 | ORAL_TABLET | Freq: Four times a day (QID) | ORAL | Status: DC | PRN

## 2015-12-23 MED ORDER — IOHEXOL 300 MG/ML  SOLN
100.0000 mL | Freq: Once | INTRAMUSCULAR | Status: AC | PRN
  Administered 2015-12-23: 100 mL via INTRAVENOUS

## 2015-12-23 MED ORDER — DIATRIZOATE MEGLUMINE & SODIUM 66-10 % PO SOLN
ORAL | Status: AC
  Filled 2015-12-23: qty 30

## 2015-12-23 NOTE — Discharge Instructions (Signed)
Start taking her Carafate. Follow-up with your GI doctor next week for recheck

## 2015-12-23 NOTE — ED Notes (Signed)
Pt states she has had ongoing "heartburn and back pain for months,"  Pain  is getting worse.  States pain now is in abdomen and going up to under ribs.  C/o chest tightness.

## 2015-12-23 NOTE — ED Notes (Signed)
Pt ambulated to bathroom 

## 2015-12-23 NOTE — ED Provider Notes (Signed)
CSN: [id number]     Arrival date & time 12/23/15  0901 History  By signing my name below, I, Katherine Larson, attest that this documentation has been prepared under the direction and in the presence of Milton Ferguson, MD. Electronically Signed: Terressa Larson, ED Scribe. 12/23/2015. 9:34 AM.  Chief Complaint  Patient presents with  . Chest Pain  . Back Pain   Patient is a 70 y.o. female presenting with abdominal pain. The history is provided by the patient. No language interpreter was used.  Abdominal Pain Pain location:  Periumbilical Pain quality: aching   Pain radiates to:  Epigastric region Pain severity:  Severe Duration: Months. Timing:  Intermittent Progression:  Worsening Chronicity:  Chronic Context: medication withdrawal (ran out of carafate)   Ineffective treatments: Dexilant. Associated symptoms: no diarrhea and no hematuria    PCP: [name], MD HPI Comments: Katherine Larson is a 70 y.o. female, with PMHx noted below including GERD, who presents to the Emergency Department complaining of chronic heartburn with associated periumbilical abd pain radiating to the epigastric region onset months ago. Pt reports taking dexilant for her Sx for the past week without improvement. Pt also reports taking carafate for her Sx, but reports she has been out of said meds.   Past Medical History  Diagnosis Date  . NSVT (nonsustained ventricular tachycardia) ([place])   . DM2 (diabetes mellitus, type 2) ([place])   . CAD (coronary artery disease)     s/p CABG 1996;  Myoview 3/11: mod partially reversible AS perfusion defect, most likely related to variable breast attenuation although an element of scar with peri-infarct ischemia also possible, EF 54% - felt to be low risk  . HTN (hypertension)   . HLD (hyperlipidemia)   . Asthma   . Neuropathy ([place])   . Complication of anesthesia   . PONV (postoperative nausea and vomiting)   . Shortness of breath     with activity  . Seasonal allergies    . GERD (gastroesophageal reflux disease)   . Constipation   . Diarrhea   . Rheumatoid arthritis ([facility])     RA  . Gout   . Anemia     anemia of chronic disease +/- IDA followed by Dr. Earlie Server. received iron infusions in the past  . Borderline glaucoma    Past Surgical History  Procedure Laterality Date  . Cholecystectomy    . Ptca    . Coronary artery bypass graft  1996    2 vessels  . Hernia repair      hiatal hernia  . Eye surgery Bilateral   . Abdominal hysterectomy    . Fracture surgery Right     arm  . Maximum access (mas)posterior lumbar interbody fusion (plif) 1 level N/A 08/14/2013    Procedure:  MAXIMUM ACCESS SURGERY(MAS) POSTERIOR LUMBAR INTERBODY FUSION LUMBAR THREE-FOUR ;  Surgeon: Eustace Moore, MD;  Location: [facility];  Service: Neurosurgery;  Laterality: N/A;   MAXIMUM ACCESS SURGERY(MAS) POSTERIOR LUMBAR INTERBODY FUSION LUMBAR THREE-FOUR   . Appendectomy    . Colonoscopy  11/2011    Dr. Magod:ext/int hemorrhoids, diverticulosis sigmoid colon and distal desc colon, mid-desc colon hyperplastic polyps.   . Esophagogastroduodenoscopy  11/2011    Dr. Watt Climes: gastritis per path and OV note but op note not available  . Givens capsule study       Dr. Watt Climes   Family History  Problem Relation Age of Onset  . Heart attack Father   . Heart attack  Mother   . Colon cancer Neg Hx   . Bladder Cancer Brother   . Cervical cancer Daughter     ?   Social History  Substance Use Topics  . Smoking status: Former Smoker -- 15 years    Quit date: 11/20/1994  . Smokeless tobacco: None  . Alcohol Use: No   OB History    No data available     Review of Systems  Constitutional: Negative for appetite change.  HENT: Negative for congestion, ear discharge and sinus pressure.   Eyes: Negative for discharge.  Gastrointestinal: Positive for abdominal pain. Negative for diarrhea.  Genitourinary: Negative for frequency and hematuria.  Skin: Negative for rash.  Neurological:  Negative for seizures.  Psychiatric/Behavioral: Negative for hallucinations.   Allergies  Biaxin and Penicillins  Home Medications   Prior to Admission medications   Medication Sig Start Date End Date Taking? Authorizing Provider  albuterol (PROVENTIL HFA;VENTOLIN HFA) 108 (90 BASE) MCG/ACT inhaler Inhale 2 puffs into the lungs every 6 (six) hours as needed for wheezing or shortness of breath.    Historical Provider, MD  allopurinol (ZYLOPRIM) 300 MG tablet Take 300 mg by mouth daily.    Historical Provider, MD  amitriptyline (ELAVIL) 10 MG tablet Take 20 mg by mouth at bedtime.     Historical Provider, MD  atorvastatin (LIPITOR) 80 MG tablet Take 80 mg by mouth daily.    Historical Provider, MD  dexlansoprazole (DEXILANT) 60 MG capsule Take 1 capsule (60 mg total) by mouth daily. 12/22/15   Mahala Menghini, PA-C  fexofenadine (ALLEGRA) 180 MG tablet Take 180 mg by mouth daily.      Historical Provider, MD  fluticasone (FLONASE) 50 MCG/ACT nasal spray Place 2 sprays into both nostrils daily.    Historical Provider, MD  furosemide (LASIX) 20 MG tablet Take 20 mg by mouth daily.    Historical Provider, MD  hydroxychloroquine (PLAQUENIL) 200 MG tablet Take 400 mg by mouth daily.    Historical Provider, MD  iron polysaccharides (NIFEREX) 150 MG capsule Take 150 mg by mouth daily.    Historical Provider, MD  leflunomide (ARAVA) 20 MG tablet Take 20 mg by mouth daily.    Historical Provider, MD  Linaclotide Rolan Lipa) 290 MCG CAPS capsule Take 290 mcg by mouth daily.    Historical Provider, MD  losartan (COZAAR) 100 MG tablet Take 100 mg by mouth daily.    Historical Provider, MD  lubiprostone (AMITIZA) 24 MCG capsule Take 1 capsule (24 mcg total) by mouth 2 (two) times daily with a meal. Patient not taking: Reported on 12/20/2015 12/08/15   Mahala Menghini, PA-C  metoCLOPramide (REGLAN) 5 MG tablet Take 10 mg by mouth at bedtime.     Historical Provider, MD  metoprolol tartrate (LOPRESSOR) 25 MG tablet  TAKE 3 TABLETS(75 MG) BY MOUTH TWICE DAILY 09/13/15   Josue Hector, MD  mometasone-formoterol [facility]) 100-5 MCG/ACT AERO Inhale 2 puffs into the lungs 2 (two) times daily.    Historical Provider, MD  montelukast (SINGULAIR) 10 MG tablet Take 10 mg by mouth at bedtime.    Historical Provider, MD  Multiple Vitamins-Minerals (MACULAR VITAMIN BENEFIT PO) Take 2 tablets by mouth daily.    Historical Provider, MD  nitroGLYCERIN (NITROSTAT) 0.4 MG SL tablet Place 0.4 mg under the tongue every 5 (five) minutes as needed for chest pain. Reported on 11/08/2015    Historical Provider, MD  Omega-3 Fatty Acids (FISH OIL) 1000 MG CAPS Take 1,000 mg by mouth  3 (three) times daily with meals.    Historical Provider, MD  oxyCODONE-acetaminophen (PERCOCET/ROXICET) 5-325 MG tablet Take 1 tablet by mouth every 6 (six) hours as needed.  09/07/15   Historical Provider, MD  Polyethyl Glycol-Propyl Glycol (SYSTANE OP) Apply 1 drop to eye 3 (three) times daily as needed (D[place] Eyes).    Historical Provider, MD  sucralfate (CARAFATE) 1 GM/10ML suspension Take 10 mLs (1 g total) by mouth 4 (four) times daily. 12/22/15   Mahala Menghini, PA-C  triamcinolone cream (KENALOG) 0.1 % Apply 1 application topically 2 (two) times daily.    Historical Provider, MD   Triage Vitals: BP 178/82 mmHg  Pulse 86  Temp(Src) 97.8 F (36.6 C) (Oral)  Resp 25  Ht 5' 4.5" (1.638 m)  Wt 145 lb (65.772 kg)  BMI 24.51 kg/m2  SpO2 97% Physical Exam  Constitutional: She is oriented to person, place, and time. She appears well-developed.  HENT:  Head: Normocephalic.  Eyes: Conjunctivae and EOM are normal. No scleral icterus.  Neck: Neck supple. No thyromegaly present.  Cardiovascular: Normal rate and regular rhythm.  Exam reveals no gallop and no friction rub.   No murmur heard. Pulmonary/Chest: No stridor. She has no wheezes. She has no rales. She exhibits no tenderness.  Abdominal: She exhibits no distension. There is tenderness in the  periumbilical area. There is no rebound.  Musculoskeletal: Normal range of motion. She exhibits no edema.  Lymphadenopathy:    She has no cervical adenopathy.  Neurological: She is oriented to person, place, and time. She exhibits normal muscle tone. Coordination normal.  Skin: No rash noted. No erythema.  Psychiatric: She has a normal mood and affect. Her behavior is normal.    ED Course  Procedures (including critical care time) DIAGNOSTIC STUDIES: Oxygen Saturation is 97% on ra, nl by my interpretation.    COORDINATION OF CARE: 9:27 AM: Discussed treatment plan which includes meds, imaging and labs with pt at bedside; patient verbalizes understanding and agrees with treatment plan.  Labs Review Labs Reviewed  CBC WITH DIFFERENTIAL/PLATELET  COMPREHENSIVE METABOLIC PANEL  LIPASE, BLOOD    Imaging Review No results found. I have personally reviewed and evaluated these images and lab results as part of my medical decision-making.  MDM   Final diagnoses:  None    Labs and CT scan unremarkable. Symptoms are consistent with GERD. She is on a protein pump inhibitor and is supposed to start Carafate but has not been able to get it yet. I instructed her to get her Carafate follow-up with her GI doctor next week we will give her some Vicodin for discomfort also  The chart was scribed for me under my direct supervision.  I personally performed the history, physical, and medical decision making and all procedures in the evaluation of this patient.Milton Ferguson, MD 12/23/15 [phone]

## 2015-12-23 NOTE — Telephone Encounter (Signed)
Pt's husband called this morning to let us know that they are going to the ER because the pain is unbearable.

## 2015-12-23 NOTE — Telephone Encounter (Signed)
Reviewed.    Agree.

## 2015-12-23 NOTE — ED Notes (Signed)
Pt made aware to return if symptoms worsen or if any life threatening symptoms occur.   

## 2015-12-23 NOTE — Telephone Encounter (Signed)
Noted. Thanks.

## 2015-12-23 NOTE — Telephone Encounter (Signed)
Received fax from [facility] not covered by insurance. Called [facility], asked them to change it to the pill form and have pt crush it and mix with small amount of water or applesauce. Pharmacy confirmed pill form was covered by insurance.

## 2015-12-23 NOTE — Telephone Encounter (Signed)
Reviewed er records from today. No significant findings. egd as planned.

## 2015-12-29 ENCOUNTER — Ambulatory Visit (HOSPITAL_COMMUNITY)
Admission: RE | Admit: 2015-12-29 | Discharge: 2015-12-29 | Disposition: A | Discharge status: Home / Self Care | Payer: Medicare Other | Source: Ambulatory Visit | Attending: Internal Medicine | Admitting: Internal Medicine

## 2015-12-29 ENCOUNTER — Encounter (HOSPITAL_COMMUNITY): Payer: Self-pay | Admitting: *Deleted

## 2015-12-29 ENCOUNTER — Encounter (HOSPITAL_COMMUNITY)
Admission: RE | Disposition: A | Discharge status: Home / Self Care | Payer: Self-pay | Source: Ambulatory Visit | Attending: Internal Medicine

## 2015-12-29 DIAGNOSIS — E119 Type 2 diabetes mellitus without complications: Secondary | ICD-10-CM | POA: Insufficient documentation

## 2015-12-29 DIAGNOSIS — R634 Abnormal weight loss: Secondary | ICD-10-CM

## 2015-12-29 DIAGNOSIS — K2970 Gastritis, unspecified, without bleeding: Secondary | ICD-10-CM | POA: Diagnosis not present

## 2015-12-29 DIAGNOSIS — E785 Hyperlipidemia, unspecified: Secondary | ICD-10-CM | POA: Insufficient documentation

## 2015-12-29 DIAGNOSIS — M069 Rheumatoid arthritis, unspecified: Secondary | ICD-10-CM | POA: Insufficient documentation

## 2015-12-29 DIAGNOSIS — Z951 Presence of aortocoronary bypass graft: Secondary | ICD-10-CM | POA: Diagnosis not present

## 2015-12-29 DIAGNOSIS — R1310 Dysphagia, unspecified: Secondary | ICD-10-CM | POA: Diagnosis not present

## 2015-12-29 DIAGNOSIS — K449 Diaphragmatic hernia without obstruction or gangrene: Secondary | ICD-10-CM | POA: Insufficient documentation

## 2015-12-29 DIAGNOSIS — K219 Gastro-esophageal reflux disease without esophagitis: Secondary | ICD-10-CM | POA: Insufficient documentation

## 2015-12-29 DIAGNOSIS — Z808 Family history of malignant neoplasm of other organs or systems: Secondary | ICD-10-CM | POA: Diagnosis not present

## 2015-12-29 DIAGNOSIS — Z87891 Personal history of nicotine dependence: Secondary | ICD-10-CM | POA: Diagnosis not present

## 2015-12-29 DIAGNOSIS — Z8052 Family history of malignant neoplasm of bladder: Secondary | ICD-10-CM | POA: Insufficient documentation

## 2015-12-29 DIAGNOSIS — Z79899 Other long term (current) drug therapy: Secondary | ICD-10-CM | POA: Diagnosis not present

## 2015-12-29 DIAGNOSIS — I10 Essential (primary) hypertension: Secondary | ICD-10-CM | POA: Insufficient documentation

## 2015-12-29 DIAGNOSIS — K2289 Other specified disease of esophagus: Secondary | ICD-10-CM

## 2015-12-29 DIAGNOSIS — K3189 Other diseases of stomach and duodenum: Secondary | ICD-10-CM | POA: Diagnosis not present

## 2015-12-29 DIAGNOSIS — R1013 Epigastric pain: Secondary | ICD-10-CM | POA: Diagnosis not present

## 2015-12-29 DIAGNOSIS — I2510 Atherosclerotic heart disease of native coronary artery without angina pectoris: Secondary | ICD-10-CM | POA: Diagnosis not present

## 2015-12-29 DIAGNOSIS — K5710 Diverticulosis of small intestine without perforation or abscess without bleeding: Secondary | ICD-10-CM | POA: Diagnosis not present

## 2015-12-29 DIAGNOSIS — J45909 Unspecified asthma, uncomplicated: Secondary | ICD-10-CM | POA: Insufficient documentation

## 2015-12-29 DIAGNOSIS — R1319 Other dysphagia: Secondary | ICD-10-CM

## 2015-12-29 DIAGNOSIS — K228 Other specified diseases of esophagus: Secondary | ICD-10-CM

## 2015-12-29 HISTORY — PX: ESOPHAGOGASTRODUODENOSCOPY: SHX5428

## 2015-12-29 LAB — GLUCOSE, CAPILLARY: Glucose-Capillary: 125 mg/dL — ABNORMAL HIGH (ref 65–99)

## 2015-12-29 SURGERY — EGD (ESOPHAGOGASTRODUODENOSCOPY)
Anesthesia: Moderate Sedation

## 2015-12-29 MED ORDER — ONDANSETRON HCL 4 MG/2ML IJ SOLN
INTRAMUSCULAR | Status: AC
  Filled 2015-12-29: qty 2

## 2015-12-29 MED ORDER — SODIUM CHLORIDE 0.9 % IV SOLN
INTRAVENOUS | Status: DC
Start: 2015-12-29 — End: 2015-12-29

## 2015-12-29 MED ORDER — PROMETHAZINE HCL 25 MG/ML IJ SOLN: 12.5000 mg | Freq: Once | INTRAMUSCULAR | Status: DC

## 2015-12-29 MED ORDER — MEPERIDINE HCL 100 MG/ML IJ SOLN
INTRAMUSCULAR | Status: AC
  Filled 2015-12-29: qty 2

## 2015-12-29 MED ORDER — ONDANSETRON HCL 4 MG/2ML IJ SOLN
INTRAMUSCULAR | Status: DC | PRN
  Administered 2015-12-29: 4 mg via INTRAVENOUS

## 2015-12-29 MED ORDER — LIDOCAINE VISCOUS 2 % MT SOLN
OROMUCOSAL | Status: AC
  Filled 2015-12-29: qty 15

## 2015-12-29 MED ORDER — PROMETHAZINE HCL 25 MG/ML IJ SOLN
INTRAMUSCULAR | Status: AC
  Filled 2015-12-29: qty 1

## 2015-12-29 MED ORDER — PROMETHAZINE HCL 25 MG/ML IJ SOLN
INTRAMUSCULAR | Status: DC | PRN
  Administered 2015-12-29: 12.5 mg via INTRAVENOUS

## 2015-12-29 MED ORDER — SODIUM CHLORIDE 0.9% FLUSH
INTRAVENOUS | Status: AC
  Filled 2015-12-29: qty 10

## 2015-12-29 MED ORDER — LIDOCAINE VISCOUS 2 % MT SOLN
OROMUCOSAL | Status: DC | PRN
  Administered 2015-12-29: 5 mL via OROMUCOSAL

## 2015-12-29 MED ORDER — MEPERIDINE HCL 100 MG/ML IJ SOLN
INTRAMUSCULAR | Status: DC | PRN
  Administered 2015-12-29: 50 mg
  Administered 2015-12-29: 50 mg via INTRAVENOUS

## 2015-12-29 MED ORDER — MIDAZOLAM HCL 5 MG/5ML IJ SOLN
INTRAMUSCULAR | Status: DC | PRN
  Administered 2015-12-29 (×2): 2 mg via INTRAVENOUS

## 2015-12-29 MED ORDER — MIDAZOLAM HCL 5 MG/5ML IJ SOLN
INTRAMUSCULAR | Status: AC
  Filled 2015-12-29: qty 10

## 2015-12-29 NOTE — Op Note (Signed)
[place], [id number]   ENDOSCOPY PROCEDURE REPORT  PATIENT: Katherine Larson, Katherine Larson  MR#: [id number] BIRTHDATE: 12/12/1945 , 89  yrs. old GENDER: female ENDOSCOPIST: R.  Garfield Cornea, MD FACP FACG REFERRED BY:  Harlan Stains, M.D. PROCEDURE DATE:  01-08-2016 PROCEDURE:  EGD w/ biopsy and Maloney dilation of esophagus INDICATIONS:  Esophageal dysphagia; epigastric pain. MEDICATIONS: Versed 4 mg IV and Demerol 100 mg IV in divided doses. Zofran 4 mg IV.  Phenergan 12.5 mg IV.  Xylocaine gel orally. ASA CLASS:      Class II  CONSENT: The risks, benefits, limitations, alternatives and imponderables have been discussed.  The potential for biopsy, esophogeal dilation, etc. have also been reviewed.  Questions have been answered.  All parties agreeable.  Please see the history and physical in the medical record for more information.  DESCRIPTION OF PROCEDURE: After the risks benefits and alternatives of the procedure were thoroughly explained, informed consent was obtained.  The [id number] endoscope was introduced through the mouth and advanced to the second portion of the duodenum , limited by Without limitations. The instrument was slowly withdrawn as the mucosa was fully examined. Estimated blood loss is zero unless otherwise noted in this procedure report.    Normal, patent appearing tubular esophagus.  Stomach empty.  2 cm hiatal hernia present.  Gastric mucosa injected with patchy erythema.  No ulcer infiltrating process.  Somewhat polypoid antral mucosa.  No ulcer infiltrating process.  Patent pylorus. Examination bulb and second portion revealed 2 large duodenal diverticula; otherwise, the remainder of the duodenal mucosa seen appeared normal.  Scope was withdrawn and a 54 [place] Maloney dilator was passed to full insertion. A look back revealed no apparent complication related to this maneuver. Subsequently, biopsies of abnormal  gastric mucosa taken.          The scope was then withdrawn from the patient and the procedure completed.  COMPLICATIONS: There were no immediate complications.  ENDOSCOPIC IMPRESSION: Normal-appearing tubular esophagus status post Maloney dilation. Hiatal hernia. Abnormal appearing gastric mucosa?"status post biopsy. Duodenal diverticula  RECOMMENDATIONS: Follow-up on pathology. Further recommendations to follow.  REPEAT EXAM:  eSigned:  R. Garfield Cornea, MD Rosalita Chessman [facility] 01-08-16 2:42 PM    CC:  CPT CODES: ICD CODES:  The ICD and CPT codes recommended by this software are interpretations from the data that the clinical staff has captured with the software.  The verification of the translation of this report to the ICD and CPT codes and modifiers is the sole responsibility of the health care institution and practicing physician where this report was generated.  [employer]. will not be held responsible for the validity of the ICD and CPT codes included on this report.  AMA assumes no liability for data contained or not contained herein. CPT is a Designer, television/film set of the [employer].

## 2015-12-29 NOTE — H&P (View-Only) (Signed)
Primary Care Physician:  [name], MD  Primary Gastroenterologist:  Garfield Cornea, MD   Chief Complaint  Patient presents with  . Abdominal Pain  . Gastroesophageal Reflux  . Nausea    no vomiting  . Constipation    HPI:  Katherine Larson is a 70 y.o. female here as a new patient for further evaluation of abdominal pain, GERD, constipation, nausea at the request of Dr. Dema Severin with [facility].   Patient has previously been followed by Dr. Watt Climes and [name] for at least 10 years. Patient states that he will no longer see her. Her records were sent to [name] but they declined to accept her as a new patient.   We have requested her GI records but have not received at time of her appointment today. She gives a history of chronic GERD, anemia. She has a history of anemia mixed with IDA, previously required iron infusions by Dr. Earlie Server, last infusion about one year ago, has been followed closely currently on oral supplements. Previous workup reportedly included EGD, colonoscopy, Givens capsule endoscopy.  Patient reports chronic GERD. She states her symptoms are refractory to therapy. She takes pantoprazole 40 mg twice daily but also takes Reglan 5-10 mg at bedtime. She reports starting Reglan about 13 years ago under the direction of her PCP for nighttime reflux. One year ago she states she was having diarrhea. She saw Dr. Watt Climes who provide her medication which caused constipation. Constipation has been worse over the past 3-4 months. Started narcotics for back pain back in the fall of last year. Takes four pain pills daily. Currently utilizing Dulcolax daily. Previously failed MiraLAX. Denies blood in the stool or melena. With regards to her reflux she has only and on pantoprazole. Has not tried any other PPIs. She reports a 50 pound weight loss in the last 6 months. States she is not able to eat. Complains of postprandial epigastric pain and lower abdominal pain. Nausea without  vomiting. Complains of solid food dysphagia especially meat and bread. Pretty much avoids these foods. Complains of soreness in the epigastrium and radiating into both lower ribs. Denies dysuria.  She has been suffering from back pain. Between back pain and abdominal pain she gets woken up every night. States that there are no surgical fixes. She wonders if she is rejecting the hardware in her spine. She states she rejected cardiac stent previously and is subsequently had a coronary artery bypass grafting. Is going to pain management clinic tomorrow to establish care.     Current Outpatient Prescriptions  Medication Sig Dispense Refill  . albuterol (PROVENTIL HFA;VENTOLIN HFA) 108 (90 BASE) MCG/ACT inhaler Inhale 2 puffs into the lungs every 6 (six) hours as needed for wheezing or shortness of breath.    . allopurinol (ZYLOPRIM) 300 MG tablet Take 300 mg by mouth daily.    Marland Kitchen amitriptyline (ELAVIL) 10 MG tablet Take 20 mg by mouth at bedtime.     Marland Kitchen atorvastatin (LIPITOR) 80 MG tablet Take 80 mg by mouth daily.    . fexofenadine (ALLEGRA) 180 MG tablet Take 180 mg by mouth daily.      . fluticasone (FLONASE) 50 MCG/ACT nasal spray Place 2 sprays into both nostrils daily.    . furosemide (LASIX) 20 MG tablet Take 20 mg by mouth daily.    . hydroxychloroquine (PLAQUENIL) 200 MG tablet Take 400 mg by mouth daily.    . iron polysaccharides (NIFEREX) 150 MG capsule Take 150 mg by mouth daily.    Marland Kitchen  leflunomide (ARAVA) 20 MG tablet Take 20 mg by mouth daily.    Marland Kitchen losartan (COZAAR) 100 MG tablet Take 100 mg by mouth daily.    . metoCLOPramide (REGLAN) 5 MG tablet Take 10 mg by mouth at bedtime.     . metoprolol tartrate (LOPRESSOR) 25 MG tablet TAKE 3 TABLETS(75 MG) BY MOUTH TWICE DAILY 180 tablet 8  . mometasone-formoterol (DULERA) 100-5 MCG/ACT AERO Inhale 2 puffs into the lungs 2 (two) times daily.    . montelukast (SINGULAIR) 10 MG tablet Take 10 mg by mouth at bedtime.    . Multiple  Vitamins-Minerals (MACULAR VITAMIN BENEFIT PO) Take 2 tablets by mouth daily.    . nitroGLYCERIN (NITROSTAT) 0.4 MG SL tablet Place 0.4 mg under the tongue every 5 (five) minutes as needed for chest pain. Reported on 11/08/2015    . Omega-3 Fatty Acids (FISH OIL) 1000 MG CAPS Take 1,000 mg by mouth 3 (three) times daily with meals.    Marland Kitchen oxyCODONE-acetaminophen (PERCOCET/ROXICET) 5-325 MG tablet   0  . pantoprazole (PROTONIX) 40 MG tablet Take 40 mg by mouth 2 (two) times daily.    Vladimir Faster Glycol-Propyl Glycol (SYSTANE OP) Apply 1 drop to eye 3 (three) times daily as needed (Dry Eyes).    . triamcinolone cream (KENALOG) 0.1 % Apply 1 application topically 2 (two) times daily.     No current facility-administered medications for this visit.    Allergies as of 12/08/2015 - Review Complete 12/08/2015  Allergen Reaction Noted  . Biaxin [clarithromycin] Rash and Other (See Comments) 02/02/2010  . Penicillins Rash and Other (See Comments) 02/02/2010    Past Medical History  Diagnosis Date  . NSVT (nonsustained ventricular tachycardia) ([place])   . DM2 (diabetes mellitus, type 2) ([place])   . CAD (coronary artery disease)     s/p CABG 1996;  Myoview 3/11: mod partially reversible AS perfusion defect, most likely related to variable breast attenuation although an element of scar with peri-infarct ischemia also possible, EF 54% - felt to be low risk  . HTN (hypertension)   . HLD (hyperlipidemia)   . Asthma   . Neuropathy ([place])   . Complication of anesthesia   . PONV (postoperative nausea and vomiting)   . Shortness of breath     with activity  . Seasonal allergies   . GERD (gastroesophageal reflux disease)   . Constipation   . Diarrhea   . Rheumatoid arthritis ([facility])     RA  . Gout   . Anemia     anemia of chronic disease +/- IDA followed by Dr. Earlie Server. received iron infusions in the past    Past Surgical History  Procedure Laterality Date  . Cholecystectomy    . Ptca    . Coronary  artery bypass graft  1996    2 vessels  . Hernia repair      hiatal hernia  . Eye surgery Bilateral   . Abdominal hysterectomy    . Fracture surgery Right     arm  . Maximum access (mas)posterior lumbar interbody fusion (plif) 1 level N/A 08/14/2013    Procedure:  MAXIMUM ACCESS SURGERY(MAS) POSTERIOR LUMBAR INTERBODY FUSION LUMBAR THREE-FOUR ;  Surgeon: Eustace Moore, MD;  Location: [facility];  Service: Neurosurgery;  Laterality: N/A;   MAXIMUM ACCESS SURGERY(MAS) POSTERIOR LUMBAR INTERBODY FUSION LUMBAR THREE-FOUR   . Appendectomy    . Colonoscopy  2013    Dr. Watt Climes  . Esophagogastroduodenoscopy  2013    Dr. Watt Climes  .  Givens capsule study      Dr. Watt Climes    Family History  Problem Relation Age of Onset  . Heart attack Father   . Heart attack Mother   . Colon cancer Neg Hx   . Bladder Cancer Brother   . Cervical cancer Daughter     ?    Social History   Social History  . Marital Status: Married    Spouse Name: N/A  . Number of Children: 3  . Years of Education: N/A   Occupational History  . Not on file.   Social History Main Topics  . Smoking status: Former Smoker -- 15 years    Quit date: 11/20/1994  . Smokeless tobacco: Not on file  . Alcohol Use: No  . Drug Use: No  . Sexual Activity: Not on file   Other Topics Concern  . Not on file   Social History Narrative   2 living children, one deceased age 25         ROS:  General: Negative for anorexia,  fever, chills, fatigue, weakness. See hpi Eyes: Negative for vision changes.  ENT: Negative for hoarseness,  nasal congestion. See hpi CV: Negative for chest pain, angina, palpitations, dyspnea on exertion, peripheral edema.  Respiratory: Negative for dyspnea at rest, dyspnea on exertion, cough, sputum, wheezing.  GI: See history of present illness. GU:  Negative for dysuria, hematuria, urinary incontinence, urinary frequency, nocturnal urination.  MS: +chronic joint and back pain.  Derm: Negative for  rash or itching.  Neuro: Negative for weakness, abnormal sensation, seizure, frequent headaches, memory loss, confusion.  Psych: Negative for anxiety, depression, suicidal ideation, hallucinations.  Endo: see hpi.  Heme: Negative for bruising or bleeding. Allergy: Negative for rash or hives.    Physical Examination:  BP 120/71 mmHg  Pulse 83  Temp(Src) 97.8 F (36.6 C) (Oral)  Ht 5' 4.5" (1.638 m)  Wt 151 lb 11.2 oz (68.811 kg)  BMI 25.65 kg/m2   General: Well-nourished, well-developed in no acute distress. Accompanied by spouse.  Head: Normocephalic, atraumatic.   Eyes: Conjunctiva pink, no icterus. Mouth: Oropharyngeal mucosa moist and pink , no lesions erythema or exudate. Neck: Supple without thyromegaly, masses, or lymphadenopathy.  Lungs: Clear to auscultation bilaterally.  Heart: Regular rate and rhythm, no murmurs rubs or gallops.  Abdomen: Bowel sounds are normal, mild epigastric and lower abdominal tenderness, nondistended, no hepatosplenomegaly or masses, no abdominal bruits or    hernia , no rebound or guarding.   Rectal: not performed Extremities: No lower extremity edema. No clubbing or deformities.  Neuro: Alert and oriented x 4 , grossly normal neurologically.  Skin: Warm and dry, no rash or jaundice.   Psych: Alert and cooperative, normal mood and affect.  Labs: September 2016 white blood cell count 8400, hemoglobin 12, hematocrit 35.2, MCV 92.1 back in August 2016 her hemoglobin was 11.26 June 2015 total bilirubin 0.6, alkaline phosphatase 51, AST 28, ALT 16, calcium 9.8, hemoglobin A1c 4.6, creatinine 0.91 February 2016, ferritin 119  Imaging Studies: 04/13/15 CT A/P W CM  CLINICAL DATA: Lower abdominal pain and bloating for 1 month.  EXAM: CT ABDOMEN AND PELVIS WITH CONTRAST  TECHNIQUE: Multidetector CT imaging of the abdomen and pelvis was performed using the standard protocol following bolus administration of intravenous  contrast.  CONTRAST: 141mL OMNIPAQUE IOHEXOL 300 MG/ML SOLN  COMPARISON: 08/15/2012  FINDINGS: Lower chest: Borderline wall thickening in the distal esophagus, similar to prior exam from 3 years ago. There is mild lingular  atelectasis.  Hepatobiliary: Cholecystectomy  Pancreas: Unremarkable  Spleen: Unremarkable  Adrenals/Urinary Tract: Benign 0.9 by 1.4 cm cyst posteriorly in the right mid kidney.  Stomach/Bowel: Transverse duodenal diverticulum just below the pancreatic head. Sigmoid colon diverticulosis.  Vascular/Lymphatic: Porta hepatis node, 1.1 cm on image 22 series 2, formerly 1.0 cm.  Aortoiliac atherosclerotic vascular disease. Portacaval node 1.4 cm, formerly 1.3 cm.  Reproductive: Uterus absent. Ovaries normal.  Other: No supplemental non-categorized findings.  Musculoskeletal: Moderate degenerative arthropathy of both hips. Mild chronic deformity of the pubic symphysis. Posterolateral rod and pedicle screw fixation at L3-4 with posterior decompression.  IMPRESSION: 1. No cause for the patient's lower abdominal pain and bloating is identified. 2. Various findings include chronic mild distal esophageal wall thickening (esophagitis not excluded); mild but essentially stable enlargement of a porta hepatis and portacaval lymph node; sigmoid diverticulosis without active diverticulitis ; moderate degenerative arthropathy of both hips; and mild chronic deformity of the pubic symphysis, possibly from remote trauma.   Electronically Signed  By: Van Clines M.D.  On: 04/13/2015 12:54  Impression/plan:  70 year old female who presents today transfer GI care. Previously followed by Dr. Watt Climes. We have requested records for review. She has a history of chronic anemia/IDA followed by hematology (Dr. Earlie Server). Previously required IV iron infusions. Sounds like she had an extensive workup including EGD, colonoscopy, small bowel capsule  endoscopy. Her hemoglobin has been stable. She has been on oral iron supplements. She presents with complaints of refractory GERD along with esophageal dysphagia to solid foods. Notably CT scan last year showed persistent chronic mild distal esophageal wall thickening seen previously on CT 3 years prior. Currently on pantoprazole twice a day and Reglan at bedtime with significant breakthrough symptoms. She enforces a 50 pound weight loss over the past 6 months which she relates to diminished oral intake in the setting of postprandial epigastric pain and nausea. She also notes chronic constipation has worsened with the addition of pain medication in the past 4 months.  Discussed at length with patient and spouse today. We will go ahead and make some medication changes to see if he can get her GERD and constipation under better control. Switch from pantoprazole to omeprazole 20 mg twice a day 30 minutes before a meal, Amitiza 24 g twice a day with food for constipation. Continue Reglan for now at bedtime. In the meantime we will review her records as they become available. We will update labs and screening for celiac as well as check lipase. At a minimum she will likely need EGD with dilation. Further recommendations to follow.

## 2015-12-29 NOTE — Discharge Instructions (Signed)

## 2015-12-29 NOTE — Interval H&P Note (Signed)
History and Physical Interval Note:  12/29/2015 2:08 PM  Katherine Larson  has presented today for surgery, with the diagnosis of GERD, dysphagia, unintentional weight loss, epigastric pain, esophageal wall thickening on CT  The various methods of treatment have been discussed with the patient and family. After consideration of risks, benefits and other options for treatment, the patient has consented to  Procedure(s) with comments: ESOPHAGOGASTRODUODENOSCOPY (EGD) (N/A) - 0930 - moved to 2/8 @ 1:45 - office to notify pt as a surgical intervention .  The patient's history has been reviewed, patient examined, no change in status, stable for surgery.  I have reviewed the patient's chart and labs.  Questions were answered to the patient's satisfaction.     Katherine Larson  No change. Dysphagia persists. EGD with ED as feasible/appropriate.  The risks, benefits, limitations, alternatives and imponderables have been reviewed with the patient. Potential for esophageal dilation, biopsy, etc. have also been reviewed.  Questions have been answered. All parties agreeable.

## 2016-01-04 ENCOUNTER — Encounter (HOSPITAL_COMMUNITY): Payer: Self-pay | Admitting: Internal Medicine

## 2016-01-05 ENCOUNTER — Telehealth: Payer: Self-pay

## 2016-01-05 ENCOUNTER — Encounter: Payer: Self-pay | Admitting: Internal Medicine

## 2016-01-05 NOTE — Telephone Encounter (Signed)
APPT MADE

## 2016-01-05 NOTE — Telephone Encounter (Signed)
Letter mailed to the pt. 

## 2016-01-05 NOTE — Telephone Encounter (Signed)
Per RMR-  Send letter to patient.  Send copy of letter with path to referring provider and PCP.    Needs ov in 1 mo w extender

## 2016-01-06 ENCOUNTER — Other Ambulatory Visit: Payer: Self-pay

## 2016-01-07 MED ORDER — LINACLOTIDE 290 MCG PO CAPS: 290.0000 ug | ORAL_CAPSULE | Freq: Every day | ORAL | Status: DC

## 2016-01-08 ENCOUNTER — Encounter (HOSPITAL_COMMUNITY): Payer: Self-pay | Admitting: *Deleted

## 2016-01-08 ENCOUNTER — Emergency Department (HOSPITAL_COMMUNITY)
Admission: EM | Admit: 2016-01-08 | Discharge: 2016-01-09 | Disposition: A | Discharge status: Home / Self Care | Payer: Medicare Other | Attending: Emergency Medicine | Admitting: Emergency Medicine

## 2016-01-08 DIAGNOSIS — M069 Rheumatoid arthritis, unspecified: Secondary | ICD-10-CM | POA: Insufficient documentation

## 2016-01-08 DIAGNOSIS — I2510 Atherosclerotic heart disease of native coronary artery without angina pectoris: Secondary | ICD-10-CM | POA: Insufficient documentation

## 2016-01-08 DIAGNOSIS — K219 Gastro-esophageal reflux disease without esophagitis: Secondary | ICD-10-CM | POA: Diagnosis not present

## 2016-01-08 DIAGNOSIS — E785 Hyperlipidemia, unspecified: Secondary | ICD-10-CM | POA: Insufficient documentation

## 2016-01-08 DIAGNOSIS — K5909 Other constipation: Secondary | ICD-10-CM | POA: Insufficient documentation

## 2016-01-08 DIAGNOSIS — M549 Dorsalgia, unspecified: Secondary | ICD-10-CM | POA: Insufficient documentation

## 2016-01-08 DIAGNOSIS — G629 Polyneuropathy, unspecified: Secondary | ICD-10-CM | POA: Insufficient documentation

## 2016-01-08 DIAGNOSIS — Z87891 Personal history of nicotine dependence: Secondary | ICD-10-CM | POA: Insufficient documentation

## 2016-01-08 DIAGNOSIS — E119 Type 2 diabetes mellitus without complications: Secondary | ICD-10-CM | POA: Diagnosis not present

## 2016-01-08 DIAGNOSIS — I10 Essential (primary) hypertension: Secondary | ICD-10-CM | POA: Insufficient documentation

## 2016-01-08 DIAGNOSIS — Z79899 Other long term (current) drug therapy: Secondary | ICD-10-CM | POA: Insufficient documentation

## 2016-01-08 DIAGNOSIS — G8929 Other chronic pain: Secondary | ICD-10-CM | POA: Insufficient documentation

## 2016-01-08 DIAGNOSIS — J45909 Unspecified asthma, uncomplicated: Secondary | ICD-10-CM | POA: Insufficient documentation

## 2016-01-08 MED ORDER — ONDANSETRON 4 MG PO TBDP
4.0000 mg | ORAL_TABLET | Freq: Once | ORAL | Status: AC
  Administered 2016-01-09: 4 mg via ORAL
  Filled 2016-01-08: qty 1

## 2016-01-08 MED ORDER — HYDROMORPHONE HCL 1 MG/ML IJ SOLN
1.0000 mg | Freq: Once | INTRAMUSCULAR | Status: AC
  Administered 2016-01-09: 1 mg via INTRAMUSCULAR
  Filled 2016-01-08: qty 1

## 2016-01-08 NOTE — ED Provider Notes (Signed)
By signing my name below, I, Hansel Feinstein, attest that this documentation has been prepared under the direction and in the presence of [place], DO. Electronically Signed: Hansel Feinstein, ED Scribe. 01/08/2016. 11:48 PM.   TIME SEEN: 11:36 PM   CHIEF COMPLAINT:  Chief Complaint  Patient presents with  . Back Pain    HPI:  HPI Comments: Katherine Larson is a 70 y.o. female with h/o type II DM, HTN, HLD, GERD, rheumatoid arthritis on Plaquenil who presents to the Emergency Department complaining of moderate, gradually worsening  generalized back pain onset 6 months ago. Pt states associated seconds worth of tingling to both of her upper extremities intermittently for the past 6 months as well but not currently. No numbness or focal weakness. No bowel or bladder incontinence.  She also complains of constipation and has been taking Linzess with some relief. Pt denies recent trauma, injury, heavy lifting, falls, twisting, bending. Pt notes she had back surgery by Dr. Ronnald Ramp at [facility] years ago and was informed she had arthritis in her back. She states she had epidural injections to the lower back by [facility] several weeks ago with no relief. Per pt she was then prescribed Nucynta and she states this does not alleviate her pain. She last had MRI on 09/16/2015 showing post-surgical changes at L3/L4 where she had fusion.   She ambulates without difficulty and without an assistive device. PCP is Dr. Dema Severin. She denies fever, numbness or paresthesia to the lower extremities, weakness, bowel or bladder incontinence, dysuria, difficulty urinating.   States she has vomited today. No abdominal distention. She is passing gas. No abdominal pain.  ROS: See HPI Constitutional: no fever  Eyes: no drainage  ENT: no runny nose   Cardiovascular:  no chest pain  Resp: no SOB  GI: no vomiting. Positive for constipation  GU: no dysuria Integumentary: no rash  Allergy: no hives   Musculoskeletal: no leg swelling. Positive for back pain  Neurological: no slurred speech, weakness. Positive for numbness and paresthesia to the upper extremities (not lower) ROS otherwise negative  PAST MEDICAL HISTORY/PAST SURGICAL HISTORY:  Past Medical History  Diagnosis Date  . NSVT (nonsustained ventricular tachycardia) ([place])   . DM2 (diabetes mellitus, type 2) ([place])   . CAD (coronary artery disease)     s/p CABG 1996;  Myoview 3/11: mod partially reversible AS perfusion defect, most likely related to variable breast attenuation although an element of scar with peri-infarct ischemia also possible, EF 54% - felt to be low risk  . HTN (hypertension)   . HLD (hyperlipidemia)   . Asthma   . Neuropathy ([place])   . Complication of anesthesia   . PONV (postoperative nausea and vomiting)   . Shortness of breath     with activity  . Seasonal allergies   . GERD (gastroesophageal reflux disease)   . Constipation   . Diarrhea   . Rheumatoid arthritis (H[facility])     RA  . Gout   . Anemia     anemia of chronic disease +/- IDA followed by Dr. Earlie Server. received iron infusions in the past  . Borderline glaucoma     MEDICATIONS:  Prior to Admission medications   Medication Sig Start Date End Date Taking? Authorizing Provider  albuterol (PROVENTIL HFA;VENTOLIN HFA) 108 (90 BASE) MCG/ACT inhaler Inhale 2 puffs into the lungs every 6 (six) hours as needed for wheezing or shortness of breath.    Historical Provider, MD  allopurinol (ZYLOPRIM) 300  MG tablet Take 300 mg by mouth daily.    Historical Provider, MD  amitriptyline (ELAVIL) 10 MG tablet Take 20 mg by mouth at bedtime.     Historical Provider, MD  atorvastatin (LIPITOR) 80 MG tablet Take 80 mg by mouth daily.    Historical Provider, MD  dexlansoprazole (DEXILANT) 60 MG capsule Take 1 capsule (60 mg total) by mouth daily. 12/22/15   Mahala Menghini, PA-C  fexofenadine (ALLEGRA) 180 MG tablet Take 180 mg by mouth daily.      Historical  Provider, MD  fluticasone (FLONASE) 50 MCG/ACT nasal spray Place 2 sprays into both nostrils daily.    Historical Provider, MD  furosemide (LASIX) 20 MG tablet Take 20 mg by mouth daily.    Historical Provider, MD  HYDROcodone-acetaminophen (NORCO/VICODIN) 5-325 MG tablet Take 1 tablet by mouth every 6 (six) hours as needed. 12/23/15   Milton Ferguson, MD  hydroxychloroquine (PLAQUENIL) 200 MG tablet Take 400 mg by mouth daily.    Historical Provider, MD  iron polysaccharides (NIFEREX) 150 MG capsule Take 150 mg by mouth daily.    Historical Provider, MD  lansoprazole (PREVACID) 15 MG capsule Take 15 mg by mouth daily at 12 noon.    Historical Provider, MD  leflunomide (ARAVA) 20 MG tablet Take 20 mg by mouth daily.    Historical Provider, MD  Linaclotide Rolan Lipa) 290 MCG CAPS capsule Take 1 capsule (290 mcg total) by mouth daily. 01/07/16   Mahala Menghini, PA-C  losartan (COZAAR) 100 MG tablet Take 100 mg by mouth daily.    Historical Provider, MD  metoCLOPramide (REGLAN) 5 MG tablet Take 10 mg by mouth at bedtime.     Historical Provider, MD  metoprolol tartrate (LOPRESSOR) 25 MG tablet TAKE 3 TABLETS(75 MG) BY MOUTH TWICE DAILY 09/13/15   Josue Hector, MD  mometasone-formoterol [facility]) 100-5 MCG/ACT AERO Inhale 2 puffs into the lungs 2 (two) times daily.    Historical Provider, MD  montelukast (SINGULAIR) 10 MG tablet Take 10 mg by mouth at bedtime.    Historical Provider, MD  Multiple Vitamins-Minerals (MACULAR VITAMIN BENEFIT PO) Take 2 tablets by mouth daily.    Historical Provider, MD  nitroGLYCERIN (NITROSTAT) 0.4 MG SL tablet Place 0.4 mg under the tongue every 5 (five) minutes as needed for chest pain. Reported on 11/08/2015    Historical Provider, MD  Omega-3 Fatty Acids (FISH OIL) 1000 MG CAPS Take 1,000 mg by mouth 3 (three) times daily with meals.    Historical Provider, MD  omeprazole (PRILOSEC) 20 MG capsule Take 20 mg by mouth 2 (two) times daily before a meal.    Historical  Provider, MD  oxyCODONE-acetaminophen (PERCOCET/ROXICET) 5-325 MG tablet Take 1 tablet by mouth every 6 (six) hours as needed.  09/07/15   Historical Provider, MD  Polyethyl Glycol-Propyl Glycol (SYSTANE OP) Apply 1 drop to eye 3 (three) times daily as needed (Dry Eyes).    Historical Provider, MD  sucralfate (CARAFATE) 1 GM/10ML suspension Take 10 mLs (1 g total) by mouth 4 (four) times daily. 12/22/15   Mahala Menghini, PA-C  triamcinolone cream (KENALOG) 0.1 % Apply 1 application topically 2 (two) times daily.    Historical Provider, MD    ALLERGIES:  Allergies  Allergen Reactions  . Biaxin [Clarithromycin] Rash and Other (See Comments)    Blisters in mouth   . Penicillins Rash and Other (See Comments)   SOCIAL HISTORY:  Social History  Substance Use Topics  . Smoking status: Former  Smoker -- 15 years    Quit date: 11/20/1994  . Smokeless tobacco: Not on file  . Alcohol Use: No    FAMILY HISTORY: Family History  Problem Relation Age of Onset  . Heart attack Father   . Heart attack Mother   . Colon cancer Neg Hx   . Bladder Cancer Brother   . Cervical cancer Daughter     ?    EXAM: BP 177/78 mmHg  Pulse 93  Temp(Src) 98.4 F (36.9 C) (Oral)  Resp 16  Ht 5\' 4"  (1.626 m)  Wt 140 lb (63.504 kg)  BMI 24.02 kg/m2  SpO2 98% CONSTITUTIONAL: Alert and oriented and responds appropriately to questions. Chronically ill-appearing, appears uncomfortable, afebrile and nontoxic-appearing HEAD: Normocephalic EYES: Conjunctivae clear, PERRL ENT: normal nose; no rhinorrhea; moist mucous membranes; pharynx without lesions noted NECK: Supple, no meningismus, no LAD  CARD: RRR; S1 and S2 appreciated; no murmurs, no clicks, no rubs, no gallops RESP: Normal chest excursion without splinting or tachypnea; breath sounds clear and equal bilaterally; no wheezes, no rhonchi, no rales, no hypoxia or respiratory distress, speaking full sentences ABD/GI: Normal bowel sounds; non-distended; soft,  non-tender, no rebound, no guarding, no peritoneal signs; no tympany or fluid wave BACK:  Diffusely tender anywhere with palpation with no midline step off or deformity. The back appears normal, there is no CVA tenderness EXT: Normal ROM in all joints; non-tender to palpation; no edema; normal capillary refill; no cyanosis, no calf tenderness or swelling    SKIN: Normal color for age and race; warm; no rash or lesions noted NEURO: Moves all extremities equally, sensation to light touch intact diffusely, cranial nerves II through XII intact. Strength 5/5 in all 4 extremities. No saddle anesthesia. 2+DTRs in bilateral upper and lower extremities. No clonus.  PSYCH: The patient's mood and manner are appropriate. Grooming and personal hygiene are appropriate.  MEDICAL DECISION MAKING: Patient here with complaints of chronic back pain. She is diffusely tender anywhere I palpate her back. She has no fever, neurologic deficits on exam. States that she is on Nucynta and is followed by a pain clinic. States this is not helping her pain. Had an MRI of her lumbar spine in October that showed postsurgical changes but no other acute abnormality. I do not feel at this time that she has cauda equina, spinal stenosis, epidural abscess or hematoma, transverse myelitis or discitis. I do not feel she needs an emergent MRI of her spine. No injury to suggest fracture. Will give IM Dilaudid in the emergency department for pain control and reassess. As for her constipation I suspect this is secondary to chronic opiate use. Advised her to start using earwax and Colace regularly, increasing her water and fiber intake. Abdominal exam is completely benign with no distention and no tenderness. Doubt bowel obstruction. She is passing gas normally.  ED PROGRESS: Pt reports feeling much better after IM Dilaudid. She is able to ambulate without difficulty. I feel she is safe to be discharged home with close outpatient follow-up. She has a  neurosurgeon, pain specialist and PCP. Have advised her to call her pain management doctor on Monday morning. Given she is in a pain clinic and receiving chronic pain medication and will not discharge her with narcotics and she is comfortable with this plan. Discussed with patient and husband return precautions. She verbalizes understanding and is comfortable with this plan.    I personally performed the services described in this documentation, which was scribed in my presence.  The recorded information has been reviewed and is accurate.   [place], DO 01/09/16 [phone]

## 2016-01-08 NOTE — ED Notes (Signed)
Pt c/o generalized back pain x months. Pt states she was told by the pain clinic she had arthritis. Pt states the pain medicine Nucynta isn't helping. Pt reports being constipated and having 2 episodes of vomiting today.

## 2016-01-09 MED ORDER — DOCUSATE SODIUM 100 MG PO CAPS: 100.0000 mg | ORAL_CAPSULE | Freq: Two times a day (BID) | ORAL | Status: DC

## 2016-01-09 MED ORDER — POLYETHYLENE GLYCOL 3350 17 G PO PACK: 17.0000 g | PACK | Freq: Every day | ORAL | Status: DC

## 2016-01-09 NOTE — Discharge Instructions (Signed)
Chronic Back Pain  When back pain lasts longer than 3 months, it is called chronic back pain.People with chronic back pain often go through certain periods that are more intense (flare-ups).  CAUSES Chronic back pain can be caused by wear and tear (degeneration) on different structures in your back. These structures include:  The bones of your spine (vertebrae) and the joints surrounding your spinal cord and nerve roots (facets).  The strong, fibrous tissues that connect your vertebrae (ligaments). Degeneration of these structures may result in pressure on your nerves. This can lead to constant pain. HOME CARE INSTRUCTIONS  Avoid bending, heavy lifting, prolonged sitting, and activities which make the problem worse.  Take brief periods of rest throughout the day to reduce your pain. Lying down or standing usually is better than sitting while you are resting.  Take over-the-counter or prescription medicines only as directed by your caregiver. SEEK IMMEDIATE MEDICAL CARE IF:   You have weakness or numbness in one of your legs or feet.  You have trouble controlling your bladder or bowels.  You have nausea, vomiting, abdominal pain, shortness of breath, or fainting.   This information is not intended to replace advice given to you by your health care provider. Make sure you discuss any questions you have with your health care provider.   Document Released: 12/14/2004 Document Revised: 01/29/2012 Document Reviewed: 04/26/2015 Elsevier Interactive Patient Education 2016 [employer].  Constipation, Adult Constipation is when a person has fewer than three bowel movements a week, has difficulty having a bowel movement, or has stools that are dry, hard, or larger than normal. As people grow older, constipation is more common. A low-fiber diet, not taking in enough fluids, and taking certain medicines may make constipation worse.  CAUSES   Certain medicines, such as antidepressants, pain  medicine, iron supplements, antacids, and water pills.   Certain diseases, such as diabetes, irritable bowel syndrome (IBS), thyroid disease, or depression.   Not drinking enough water.   Not eating enough fiber-rich foods.   Stress or travel.   Lack of physical activity or exercise.   Ignoring the urge to have a bowel movement.   Using laxatives too much.  SIGNS AND SYMPTOMS   Having fewer than three bowel movements a week.   Straining to have a bowel movement.   Having stools that are hard, dry, or larger than normal.   Feeling full or bloated.   Pain in the lower abdomen.   Not feeling relief after having a bowel movement.  DIAGNOSIS  Your health care provider will take a medical history and perform a physical exam. Further testing may be done for severe constipation. Some tests may include:  A barium enema X-ray to examine your rectum, colon, and, sometimes, your small intestine.   A sigmoidoscopy to examine your lower colon.   A colonoscopy to examine your entire colon. TREATMENT  Treatment will depend on the severity of your constipation and what is causing it. Some dietary treatments include drinking more fluids and eating more fiber-rich foods. Lifestyle treatments may include regular exercise. If these diet and lifestyle recommendations do not help, your health care provider may recommend taking over-the-counter laxative medicines to help you have bowel movements. Prescription medicines may be prescribed if over-the-counter medicines do not work.  HOME CARE INSTRUCTIONS   Eat foods that have a lot of fiber, such as fruits, vegetables, whole grains, and beans.  Limit foods high in fat and processed sugars, such as french fries, hamburgers,  cookies, candies, and soda.   A fiber supplement may be added to your diet if you cannot get enough fiber from foods.   Drink enough fluids to keep your urine clear or pale yellow.   Exercise regularly or  as directed by your health care provider.   Go to the restroom when you have the urge to go. Do not hold it.   Only take over-the-counter or prescription medicines as directed by your health care provider. Do not take other medicines for constipation without talking to your health care provider first.  [place] IF:   You have bright red blood in your stool.   Your constipation lasts for more than 4 days or gets worse.   You have abdominal or rectal pain.   You have thin, pencil-like stools.   You have unexplained weight loss. MAKE SURE YOU:   Understand these instructions.  Will watch your condition.  Will get help right away if you are not doing well or get worse.   This information is not intended to replace advice given to you by your health care provider. Make sure you discuss any questions you have with your health care provider.   Document Released: 08/04/2004 Document Revised: 11/27/2014 Document Reviewed: 08/18/2013 Elsevier Interactive Patient Education [employer].

## 2016-01-12 ENCOUNTER — Encounter: Payer: Self-pay | Admitting: Gastroenterology

## 2016-01-12 NOTE — Progress Notes (Signed)
Received EGD and small bowel capsule reports and PSH updated. She has gastric ulcer.

## 2016-01-13 ENCOUNTER — Other Ambulatory Visit: Payer: Self-pay | Admitting: Family Medicine

## 2016-01-13 DIAGNOSIS — M549 Dorsalgia, unspecified: Secondary | ICD-10-CM

## 2016-01-14 ENCOUNTER — Other Ambulatory Visit: Payer: Self-pay | Admitting: Family Medicine

## 2016-01-14 DIAGNOSIS — M542 Cervicalgia: Secondary | ICD-10-CM

## 2016-01-14 DIAGNOSIS — M549 Dorsalgia, unspecified: Secondary | ICD-10-CM

## 2016-01-18 ENCOUNTER — Encounter (HOSPITAL_COMMUNITY): Payer: Self-pay

## 2016-01-18 ENCOUNTER — Emergency Department (HOSPITAL_COMMUNITY): Payer: Medicare Other

## 2016-01-18 ENCOUNTER — Inpatient Hospital Stay (HOSPITAL_COMMUNITY)
Admission: EM | Admit: 2016-01-18 | Discharge: 2016-01-24 | DRG: 871 | Disposition: A | Discharge status: Home / Self Care | Payer: Medicare Other | Attending: Internal Medicine | Admitting: Internal Medicine

## 2016-01-18 DIAGNOSIS — E0520 Thyrotoxicosis with toxic multinodular goiter without thyrotoxic crisis or storm: Secondary | ICD-10-CM | POA: Diagnosis present

## 2016-01-18 DIAGNOSIS — R0902 Hypoxemia: Secondary | ICD-10-CM

## 2016-01-18 DIAGNOSIS — E871 Hypo-osmolality and hyponatremia: Secondary | ICD-10-CM | POA: Diagnosis present

## 2016-01-18 DIAGNOSIS — E869 Volume depletion, unspecified: Secondary | ICD-10-CM | POA: Diagnosis present

## 2016-01-18 DIAGNOSIS — I011 Acute rheumatic endocarditis: Secondary | ICD-10-CM | POA: Diagnosis present

## 2016-01-18 DIAGNOSIS — E785 Hyperlipidemia, unspecified: Secondary | ICD-10-CM | POA: Diagnosis present

## 2016-01-18 DIAGNOSIS — J45909 Unspecified asthma, uncomplicated: Secondary | ICD-10-CM | POA: Diagnosis present

## 2016-01-18 DIAGNOSIS — A419 Sepsis, unspecified organism: Secondary | ICD-10-CM | POA: Diagnosis not present

## 2016-01-18 DIAGNOSIS — J189 Pneumonia, unspecified organism: Secondary | ICD-10-CM | POA: Diagnosis present

## 2016-01-18 DIAGNOSIS — E0590 Thyrotoxicosis, unspecified without thyrotoxic crisis or storm: Secondary | ICD-10-CM | POA: Diagnosis present

## 2016-01-18 DIAGNOSIS — G8929 Other chronic pain: Secondary | ICD-10-CM | POA: Diagnosis present

## 2016-01-18 DIAGNOSIS — Z8249 Family history of ischemic heart disease and other diseases of the circulatory system: Secondary | ICD-10-CM

## 2016-01-18 DIAGNOSIS — D638 Anemia in other chronic diseases classified elsewhere: Secondary | ICD-10-CM | POA: Diagnosis present

## 2016-01-18 DIAGNOSIS — J441 Chronic obstructive pulmonary disease with (acute) exacerbation: Secondary | ICD-10-CM | POA: Diagnosis present

## 2016-01-18 DIAGNOSIS — E1165 Type 2 diabetes mellitus with hyperglycemia: Secondary | ICD-10-CM | POA: Diagnosis present

## 2016-01-18 DIAGNOSIS — K219 Gastro-esophageal reflux disease without esophagitis: Secondary | ICD-10-CM | POA: Diagnosis present

## 2016-01-18 DIAGNOSIS — T380X5A Adverse effect of glucocorticoids and synthetic analogues, initial encounter: Secondary | ICD-10-CM | POA: Diagnosis present

## 2016-01-18 DIAGNOSIS — I10 Essential (primary) hypertension: Secondary | ICD-10-CM | POA: Diagnosis present

## 2016-01-18 DIAGNOSIS — R000 Tachycardia, unspecified: Secondary | ICD-10-CM | POA: Diagnosis present

## 2016-01-18 DIAGNOSIS — I2510 Atherosclerotic heart disease of native coronary artery without angina pectoris: Secondary | ICD-10-CM | POA: Diagnosis present

## 2016-01-18 DIAGNOSIS — Z8711 Personal history of peptic ulcer disease: Secondary | ICD-10-CM

## 2016-01-18 DIAGNOSIS — H409 Unspecified glaucoma: Secondary | ICD-10-CM | POA: Diagnosis present

## 2016-01-18 DIAGNOSIS — I472 Ventricular tachycardia: Secondary | ICD-10-CM | POA: Diagnosis present

## 2016-01-18 DIAGNOSIS — J440 Chronic obstructive pulmonary disease with acute lower respiratory infection: Secondary | ICD-10-CM | POA: Diagnosis present

## 2016-01-18 DIAGNOSIS — Z951 Presence of aortocoronary bypass graft: Secondary | ICD-10-CM

## 2016-01-18 DIAGNOSIS — Z87891 Personal history of nicotine dependence: Secondary | ICD-10-CM

## 2016-01-18 DIAGNOSIS — E876 Hypokalemia: Secondary | ICD-10-CM | POA: Diagnosis present

## 2016-01-18 DIAGNOSIS — M545 Low back pain: Secondary | ICD-10-CM | POA: Diagnosis present

## 2016-01-18 DIAGNOSIS — Z8049 Family history of malignant neoplasm of other genital organs: Secondary | ICD-10-CM

## 2016-01-18 DIAGNOSIS — Z8052 Family history of malignant neoplasm of bladder: Secondary | ICD-10-CM

## 2016-01-18 DIAGNOSIS — M069 Rheumatoid arthritis, unspecified: Secondary | ICD-10-CM | POA: Diagnosis present

## 2016-01-18 DIAGNOSIS — E119 Type 2 diabetes mellitus without complications: Secondary | ICD-10-CM

## 2016-01-18 HISTORY — DX: Thyrotoxicosis, unspecified without thyrotoxic crisis or storm: E05.90

## 2016-01-18 LAB — COMPREHENSIVE METABOLIC PANEL
ALT: 23 U/L (ref 14–54)
AST: 24 U/L (ref 15–41)
Albumin: 2.9 g/dL — ABNORMAL LOW (ref 3.5–5.0)
Alkaline Phosphatase: 99 U/L (ref 38–126)
Anion gap: 12 (ref 5–15)
BUN: 10 mg/dL (ref 6–20)
CO2: 22 mmol/L (ref 22–32)
Calcium: 8 mg/dL — ABNORMAL LOW (ref 8.9–10.3)
Chloride: 96 mmol/L — ABNORMAL LOW (ref 101–111)
Creatinine, Ser: 0.54 mg/dL (ref 0.44–1.00)
GFR calc Af Amer: >60 mL/min (ref >60)
GFR calc non Af Amer: >60 mL/min (ref >60)
Glucose, Bld: 187 mg/dL — ABNORMAL HIGH (ref 65–99)
Potassium: 3.3 mmol/L — ABNORMAL LOW (ref 3.5–5.1)
Sodium: 130 mmol/L — ABNORMAL LOW (ref 135–145)
Total Bilirubin: 0.6 mg/dL (ref 0.3–1.2)
Total Protein: 6.7 g/dL (ref 6.5–8.1)

## 2016-01-18 LAB — CBC
HCT: 32.6 % — ABNORMAL LOW (ref 36.0–46.0)
Hemoglobin: 11.4 g/dL — ABNORMAL LOW (ref 12.0–15.0)
MCH: 31.1 pg (ref 26.0–34.0)
MCHC: 35 g/dL (ref 30.0–36.0)
MCV: 88.8 fL (ref 78.0–100.0)
Platelets: 191 10*3/uL (ref 150–400)
RBC: 3.67 MIL/uL — ABNORMAL LOW (ref 3.87–5.11)
RDW: 14.2 % (ref 11.5–15.5)
WBC: 10.8 10*3/uL — ABNORMAL HIGH (ref 4.0–10.5)

## 2016-01-18 LAB — I-STAT CG4 LACTIC ACID, ED: Lactic Acid, Venous: 1.26 mmol/L (ref 0.5–2.0)

## 2016-01-18 LAB — BLOOD GAS, ARTERIAL
Acid-base deficit: 1.9 mmol/L (ref 0.0–2.0)
Bicarbonate: 23.5 mEq/L (ref 20.0–24.0)
Drawn by: 382351
FIO2: 0.28
O2 Saturation: 95 %
Patient temperature: 37
pCO2 arterial: 26.8 mmHg — ABNORMAL LOW (ref 35.0–45.0)
pH, Arterial: 7.502 — ABNORMAL HIGH (ref 7.350–7.450)
pO2, Arterial: 81.2 mmHg (ref 80.0–100.0)

## 2016-01-18 LAB — LIPASE, BLOOD: Lipase: 18 U/L (ref 11–51)

## 2016-01-18 MED ORDER — VANCOMYCIN HCL IN DEXTROSE 1-5 GM/200ML-% IV SOLN
1000.0000 mg | Freq: Once | INTRAVENOUS | Status: AC
  Administered 2016-01-18: 1000 mg via INTRAVENOUS
  Filled 2016-01-18: qty 200

## 2016-01-18 MED ORDER — IPRATROPIUM-ALBUTEROL 0.5-2.5 (3) MG/3ML IN SOLN
3.0000 mL | Freq: Once | RESPIRATORY_TRACT | Status: AC
  Administered 2016-01-18: 3 mL via RESPIRATORY_TRACT
  Filled 2016-01-18: qty 3

## 2016-01-18 MED ORDER — SODIUM CHLORIDE 0.9 % IV BOLUS (SEPSIS)
1000.0000 mL | INTRAVENOUS | Status: AC
  Administered 2016-01-18 – 2016-01-19 (×2): 1000 mL via INTRAVENOUS

## 2016-01-18 MED ORDER — LEVOFLOXACIN IN D5W 750 MG/150ML IV SOLN
750.0000 mg | Freq: Once | INTRAVENOUS | Status: AC
  Administered 2016-01-18: 750 mg via INTRAVENOUS
  Filled 2016-01-18: qty 150

## 2016-01-18 MED ORDER — ACETAMINOPHEN 500 MG PO TABS
1000.0000 mg | ORAL_TABLET | Freq: Once | ORAL | Status: AC
  Administered 2016-01-18: 1000 mg via ORAL
  Filled 2016-01-18: qty 2

## 2016-01-18 NOTE — ED Provider Notes (Addendum)
CSN: [id number]     Arrival date & time 01/18/16  2054 History  By signing my name below, I, Helane Gunther, attest that this documentation has been prepared under the direction and in the presence of Fredia Sorrow, MD. Electronically Signed: Helane Gunther, ED Scribe. 01/18/2016. 10:58 PM.     Chief Complaint  Patient presents with  . Generalized Body Aches   The history is provided by the patient. No language interpreter was used.   HPI Comments: ELDINE LIVA is a 70 y.o. female former smoker with a PMHX of asthma, CAD, HTN, HLD, and DM, as well as a PSHx of CABG who presents to the Emergency Department complaining of generalized body aches onset 1 week ago, worsening significantly as of yesterday. She reports associated fever, fatigue, worsening, SOB, dry cough, congestion, nausea, vomiting (tonight), and diarrhea (4x tonight). She notes she was put on Tamiflu by her PCP for 5 days last week, which she has finished taking. She states she is not on oxygen at home. Pt denies bloody stool and hematemesis. She denies a PMHx of CHF.  Pt is allergic to penicillin and Biaxin.  Past Medical History  Diagnosis Date  . NSVT (nonsustained ventricular tachycardia) ([place])   . DM2 (diabetes mellitus, type 2) ([place])   . CAD (coronary artery disease)     s/p CABG 1996;  Myoview 3/11: mod partially reversible AS perfusion defect, most likely related to variable breast attenuation although an element of scar with peri-infarct ischemia also possible, EF 54% - felt to be low risk  . HTN (hypertension)   . HLD (hyperlipidemia)   . Asthma   . Neuropathy ([place])   . Complication of anesthesia   . PONV (postoperative nausea and vomiting)   . Shortness of breath     with activity  . Seasonal allergies   . GERD (gastroesophageal reflux disease)   . Constipation   . Diarrhea   . Rheumatoid arthritis ([facility])     RA  . Gout   . Anemia     anemia of chronic disease +/- IDA followed by Dr. Earlie Server. received  iron infusions in the past  . Borderline glaucoma    Past Surgical History  Procedure Laterality Date  . Cholecystectomy    . Ptca    . Coronary artery bypass graft  1996    2 vessels  . Hernia repair      hiatal hernia  . Eye surgery Bilateral   . Abdominal hysterectomy    . Fracture surgery Right     arm  . Maximum access (mas)posterior lumbar interbody fusion (plif) 1 level N/A 08/14/2013    Procedure:  MAXIMUM ACCESS SURGERY(MAS) POSTERIOR LUMBAR INTERBODY FUSION LUMBAR THREE-FOUR ;  Surgeon: Eustace Moore, MD;  Location: [facility];  Service: Neurosurgery;  Laterality: N/A;   MAXIMUM ACCESS SURGERY(MAS) POSTERIOR LUMBAR INTERBODY FUSION LUMBAR THREE-FOUR   . Appendectomy    . Colonoscopy  11/2011    Dr. Magod:ext/int hemorrhoids, diverticulosis sigmoid colon and distal desc colon, mid-desc colon hyperplastic polyps.   . Esophagogastroduodenoscopy  11/2011    Dr. Watt Climes: small hiatal hernia, one non-bleeding superficial gastric ulcer, medium-sized diverticulum in area of papilla  . [place] capsule study  11/2012    Dr. Watt Climes: minimal gastritis, normal small bowel capsule  . Esophagogastroduodenoscopy N/A 12/29/2015    Procedure: ESOPHAGOGASTRODUODENOSCOPY (EGD);  Surgeon: Daneil Dolin, MD;  Location: [facility];  Service: Endoscopy;  Laterality: N/A;  0930 - moved to 2/8 @  1:45 - office to notify pt   Family History  Problem Relation Age of Onset  . Heart attack Father   . Heart attack Mother   . Colon cancer Neg Hx   . Bladder Cancer Brother   . Cervical cancer Daughter     ?   Social History  Substance Use Topics  . Smoking status: Former Smoker -- 15 years    Quit date: 11/20/1994  . Smokeless tobacco: None  . Alcohol Use: No   OB History    No data available     Review of Systems  Constitutional: Positive for fever, chills and fatigue.  HENT: Positive for congestion.   Eyes: Positive for visual disturbance (blurred vision).  Respiratory: Positive for cough  and shortness of breath.   Cardiovascular: Positive for chest pain. Negative for leg swelling.  Gastrointestinal: Positive for nausea, vomiting, abdominal pain and diarrhea.  Genitourinary: Positive for dysuria.  Musculoskeletal: Positive for myalgias.  Skin: Negative for rash.  Neurological: Positive for headaches.    Allergies  Biaxin and Penicillins  Home Medications   Prior to Admission medications   Medication Sig Start Date End Date Taking? Authorizing Provider  albuterol (PROVENTIL HFA;VENTOLIN HFA) 108 (90 BASE) MCG/ACT inhaler Inhale 2 puffs into the lungs every 6 (six) hours as needed for wheezing or shortness of breath.   Yes Historical Provider, MD  allopurinol (ZYLOPRIM) 300 MG tablet Take 300 mg by mouth daily.   Yes Historical Provider, MD  amitriptyline (ELAVIL) 10 MG tablet Take 20 mg by mouth at bedtime.    Yes Historical Provider, MD  atorvastatin (LIPITOR) 80 MG tablet Take 80 mg by mouth daily.   Yes Historical Provider, MD  docusate sodium (COLACE) 100 MG capsule Take 1 capsule (100 mg total) by mouth every 12 (twelve) hours. 01/09/16  Yes Kristen N Ward, DO  DULoxetine (CYMBALTA) 30 MG capsule Take 60 mg by mouth daily.   Yes Historical Provider, MD  fexofenadine (ALLEGRA) 180 MG tablet Take 180 mg by mouth daily.     Yes Historical Provider, MD  fluticasone (FLONASE) 50 MCG/ACT nasal spray Place 2 sprays into both nostrils daily.   Yes Historical Provider, MD  furosemide (LASIX) 20 MG tablet Take 20 mg by mouth daily.   Yes Historical Provider, MD  hydroxychloroquine (PLAQUENIL) 200 MG tablet Take 400 mg by mouth daily.   Yes Historical Provider, MD  iron polysaccharides (NIFEREX) 150 MG capsule Take 150 mg by mouth daily.   Yes Historical Provider, MD  leflunomide (ARAVA) 20 MG tablet Take 20 mg by mouth daily.   Yes Historical Provider, MD  losartan (COZAAR) 100 MG tablet Take 100 mg by mouth daily.   Yes Historical Provider, MD  metoCLOPramide (REGLAN) 5 MG  tablet Take 10 mg by mouth at bedtime.    Yes Historical Provider, MD  metoprolol tartrate (LOPRESSOR) 25 MG tablet TAKE 3 TABLETS(75 MG) BY MOUTH TWICE DAILY 09/13/15  Yes Josue Hector, MD  mometasone-formoterol [facility]) 100-5 MCG/ACT AERO Inhale 2 puffs into the lungs daily as needed for wheezing or shortness of breath.    Yes Historical Provider, MD  montelukast (SINGULAIR) 10 MG tablet Take 10 mg by mouth at bedtime.   Yes Historical Provider, MD  Multiple Vitamins-Minerals (MACULAR VITAMIN BENEFIT PO) Take 2 tablets by mouth daily.   Yes Historical Provider, MD  nitroGLYCERIN (NITROSTAT) 0.4 MG SL tablet Place 0.4 mg under the tongue every 5 (five) minutes as needed for chest pain. Reported on  11/08/2015   Yes Historical Provider, MD  Omega-3 Fatty Acids (FISH OIL) 1000 MG CAPS Take 1,000 mg by mouth 3 (three) times daily with meals.   Yes Historical Provider, MD  omeprazole (PRILOSEC) 20 MG capsule Take 20 mg by mouth 2 (two) times daily before a meal.   Yes Historical Provider, MD  Polyethyl Glycol-Propyl Glycol (SYSTANE OP) Apply 1 drop to eye 3 (three) times daily as needed (Dry Eyes).   Yes Historical Provider, MD  polyethylene glycol (MIRALAX) packet Take 17 g by mouth daily. 01/09/16  Yes Kristen N Ward, DO  sucralfate (CARAFATE) 1 GM/10ML suspension Take 10 mLs (1 g total) by mouth 4 (four) times daily. 12/22/15  Yes Mahala Menghini, PA-C  Tapentadol HCl (NUCYNTA) 75 MG TABS Take 75 mg by mouth every 8 (eight) hours as needed (for pain).   Yes Historical Provider, MD  triamcinolone cream (KENALOG) 0.1 % Apply 1 application topically daily as needed (for irritation).    Yes Historical Provider, MD  HYDROcodone-acetaminophen (NORCO/VICODIN) 5-325 MG tablet Take 1 tablet by mouth every 8 (eight) hours as needed. 01/07/16   Historical Provider, MD  oseltamivir (TAMIFLU) 75 MG capsule Take 75 mg by mouth daily. Reported on 01/18/2016 01/13/16   Historical Provider, MD   BP 135/67 mmHg  Pulse 120   Temp(Src) 99.8 F (37.7 C) (Oral)  Resp 24  Ht 5\' 4"  (1.626 m)  Wt 63.504 kg  BMI 24.02 kg/m2  SpO2 92% Physical Exam  Constitutional: She is oriented to person, place, and time. She appears well-developed and well-nourished.  HENT:  Head: Normocephalic and atraumatic.  Dry mucous membranes  Eyes: Conjunctivae are normal. Right eye exhibits no discharge. Left eye exhibits no discharge.  Cardiovascular: Normal rate, regular rhythm and normal heart sounds.   Pulmonary/Chest: Effort normal. No respiratory distress. She has wheezes. She has rhonchi.  Bilateral rhonchi, mild wheeze  Abdominal: Soft. Bowel sounds are normal.  Neurological: She is alert and oriented to person, place, and time. No cranial nerve deficit. She exhibits normal muscle tone. Coordination normal.  Skin: Skin is warm and dry. No rash noted. She is not diaphoretic. No erythema.  Psychiatric: She has a normal mood and affect.  Nursing note and vitals reviewed.   ED Course  Procedures  DIAGNOSTIC STUDIES: Oxygen Saturation is 92% on 2 L/min, low by my interpretation.    COORDINATION OF CARE: 10:47 PM - Discussed XR results of PNA and plans to admit. Pt advised of plan for treatment and pt agrees.  Labs Review Labs Reviewed  COMPREHENSIVE METABOLIC PANEL - Abnormal; Notable for the following:    Sodium 130 (*)    Potassium 3.3 (*)    Chloride 96 (*)    Glucose, Bld 187 (*)    Calcium 8.0 (*)    Albumin 2.9 (*)    All other components within normal limits  CBC - Abnormal; Notable for the following:    WBC 10.8 (*)    RBC 3.67 (*)    Hemoglobin 11.4 (*)    HCT 32.6 (*)    All other components within normal limits  BLOOD GAS, ARTERIAL - Abnormal; Notable for the following:    pH, Arterial 7.502 (*)    pCO2 arterial 26.8 (*)    All other components within normal limits  CULTURE, BLOOD (ROUTINE X 2)  CULTURE, BLOOD (ROUTINE X 2)  URINE CULTURE  LIPASE, BLOOD  URINALYSIS, ROUTINE W REFLEX MICROSCOPIC  (NOT AT [facility])  INFLUENZA PANEL BY  PCR (TYPE A & B, H1N1)  I-STAT CG4 LACTIC ACID, ED   Results for orders placed or performed during the hospital encounter of 01/18/16  Lipase, blood  Result Value Ref Range   Lipase 18 11 - 51 U/L  Comprehensive metabolic panel  Result Value Ref Range   Sodium 130 (L) 135 - 145 mmol/L   Potassium 3.3 (L) 3.5 - 5.1 mmol/L   Chloride 96 (L) 101 - 111 mmol/L   CO2 22 22 - 32 mmol/L   Glucose, Bld 187 (H) 65 - 99 mg/dL   BUN 10 6 - 20 mg/dL   Creatinine, Ser 0.54 0.44 - 1.00 mg/dL   Calcium 8.0 (L) 8.9 - 10.3 mg/dL   Total Protein 6.7 6.5 - 8.1 g/dL   Albumin 2.9 (L) 3.5 - 5.0 g/dL   AST 24 15 - 41 U/L   ALT 23 14 - 54 U/L   Alkaline Phosphatase 99 38 - 126 U/L   Total Bilirubin 0.6 0.3 - 1.2 mg/dL   GFR calc non Af Amer >60 >60 mL/min   GFR calc Af Amer >60 >60 mL/min   Anion gap 12 5 - 15  CBC  Result Value Ref Range   WBC 10.8 (H) 4.0 - 10.5 K/uL   RBC 3.67 (L) 3.87 - 5.11 MIL/uL   Hemoglobin 11.4 (L) 12.0 - 15.0 g/dL   HCT 32.6 (L) 36.0 - 46.0 %   MCV 88.8 78.0 - 100.0 fL   MCH 31.1 26.0 - 34.0 pg   MCHC 35.0 30.0 - 36.0 g/dL   RDW 14.2 11.5 - 15.5 %   Platelets 191 150 - 400 K/uL  Blood gas, arterial ([facility] & [facility] ONLY)  Result Value Ref Range   FIO2 0.28    Delivery systems NASAL CANNULA    pH, Arterial 7.502 (H) 7.350 - 7.450   pCO2 arterial 26.8 (L) 35.0 - 45.0 mmHg   pO2, Arterial 81.2 80.0 - 100.0 mmHg   Bicarbonate 23.5 20.0 - 24.0 mEq/L   Acid-base deficit 1.9 0.0 - 2.0 mmol/L   O2 Saturation 95.0 %   Patient temperature 37.0    Collection site RIGHT RADIAL    Drawn by [id number]    Sample type ARTERIAL DRAW    Allens test (pass/fail) PASS PASS  I-Stat CG4 Lactic Acid, ED  (not at  [facility])  Result Value Ref Range   Lactic Acid, Venous 1.26 0.5 - 2.0 mmol/L     Imaging Review Dg Chest 2 View  01/18/2016  CLINICAL DATA:  70 year old female with flu-like symptoms and fever. EXAM: CHEST  2 VIEW COMPARISON:  CT dated 09/02/2015  FINDINGS: Two views of the chest demonstrate bibasilar airspace densities most compatible with pneumonia. Clinical correlation and follow-up to resolution recommended. There is no pleural effusion or pneumothorax. The cardiac silhouette is within normal limits. Median sternotomy wires and CABG vascular clips noted. There is osteopenia with degenerative changes of the spine. No acute fracture. IMPRESSION: Bibasilar airspace densities most compatible with pneumonia. Follow-up recommended. Electronically Signed   By: Anner Crete M.D.   On: 01/18/2016 22:23   I have personally reviewed and evaluated these images and lab results as part of my medical decision-making.   EKG Interpretation   Date/Time:  Tuesday January 18 2016 23:06:32 EST Ventricular Rate:  124 PR Interval:  140 QRS Duration: 84 QT Interval:  330 QTC Calculation: 474 R Axis:   81 Text Interpretation:  Sinus tachycardia Borderline right axis deviation  Probable anteroseptal  infarct, old Nonspecific repol abnormality, lateral  leads Baseline wander in lead(s) V3 Confirmed by Camry Theiss  MD, Tyrie Porzio  [phone]) on 01/18/2016 11:12:25 PM       CRITICAL CARE Performed by: Fredia Sorrow Total critical care time: 30 minutes Critical care time was exclusive of separately billable procedures and treating other patients. Critical care was necessary to treat or prevent imminent or life-threatening deterioration. Critical care was time spent personally by me on the following activities: development of treatment plan with patient and/or surrogate as well as nursing, discussions with consultants, evaluation of patient's response to treatment, examination of patient, obtaining history from patient or surrogate, ordering and performing treatments and interventions, ordering and review of laboratory studies, ordering and review of radiographic studies, pulse oximetry and re-evaluation of patient's condition.     MDM   Final diagnoses:   CAP (community acquired pneumonia)  Sepsis, due to unspecified organism [facility])  Hypoxia    Patient with a history consistent with flulike illness for the past week. Started on Tamiflu by her primary care doctor. Finish the course of that with no real improvement today started feeling worse with 3 current some fevers shortness of breath. Arrived with significant tachycardia no hypotension. Chest x-ray showing bilateral pneumonia. Hypoxia requiring 2 L of oxygen. Satting about 92% on 2 L. Lactic acid is pending but patient started on septic parameters for pre-sepsis. Started on antibiotics for pneumonia. Patient without a recent hospitalization but be consistent with community acquired pneumonia. Patient has not been on antibiotics recently.  Blood gas without evidence of acidosis elevated carbon dioxide or significant hypoxia on 2 L.   The patient will require admission.  Flu screen will be repeated.  Suspect patient had flulike illness and then went into pneumonia. Would explain her getting worse.      I personally performed the services described in this documentation, which was scribed in my presence. The recorded information has been reviewed and is accurate.     Fredia Sorrow, MD 01/18/16 [place], MD 01/18/16 [phone]

## 2016-01-18 NOTE — ED Notes (Addendum)
States she was seen here last week and dx with the flu. States she is no better after taking Tamiflu. Reports of cough, fever and vomiting that started tonight.

## 2016-01-19 DIAGNOSIS — J45909 Unspecified asthma, uncomplicated: Secondary | ICD-10-CM | POA: Diagnosis present

## 2016-01-19 DIAGNOSIS — I471 Supraventricular tachycardia: Secondary | ICD-10-CM | POA: Diagnosis not present

## 2016-01-19 DIAGNOSIS — G8929 Other chronic pain: Secondary | ICD-10-CM | POA: Diagnosis present

## 2016-01-19 DIAGNOSIS — E119 Type 2 diabetes mellitus without complications: Secondary | ICD-10-CM

## 2016-01-19 DIAGNOSIS — H409 Unspecified glaucoma: Secondary | ICD-10-CM | POA: Diagnosis present

## 2016-01-19 DIAGNOSIS — A419 Sepsis, unspecified organism: Secondary | ICD-10-CM | POA: Diagnosis present

## 2016-01-19 DIAGNOSIS — E0520 Thyrotoxicosis with toxic multinodular goiter without thyrotoxic crisis or storm: Secondary | ICD-10-CM | POA: Diagnosis present

## 2016-01-19 DIAGNOSIS — K219 Gastro-esophageal reflux disease without esophagitis: Secondary | ICD-10-CM | POA: Diagnosis present

## 2016-01-19 DIAGNOSIS — J441 Chronic obstructive pulmonary disease with (acute) exacerbation: Secondary | ICD-10-CM | POA: Diagnosis present

## 2016-01-19 DIAGNOSIS — Z8052 Family history of malignant neoplasm of bladder: Secondary | ICD-10-CM | POA: Diagnosis not present

## 2016-01-19 DIAGNOSIS — J440 Chronic obstructive pulmonary disease with acute lower respiratory infection: Secondary | ICD-10-CM | POA: Diagnosis present

## 2016-01-19 DIAGNOSIS — E1165 Type 2 diabetes mellitus with hyperglycemia: Secondary | ICD-10-CM | POA: Diagnosis present

## 2016-01-19 DIAGNOSIS — Z8249 Family history of ischemic heart disease and other diseases of the circulatory system: Secondary | ICD-10-CM | POA: Diagnosis not present

## 2016-01-19 DIAGNOSIS — E869 Volume depletion, unspecified: Secondary | ICD-10-CM | POA: Diagnosis present

## 2016-01-19 DIAGNOSIS — D638 Anemia in other chronic diseases classified elsewhere: Secondary | ICD-10-CM | POA: Diagnosis present

## 2016-01-19 DIAGNOSIS — E876 Hypokalemia: Secondary | ICD-10-CM | POA: Diagnosis present

## 2016-01-19 DIAGNOSIS — M545 Low back pain: Secondary | ICD-10-CM | POA: Diagnosis present

## 2016-01-19 DIAGNOSIS — Z951 Presence of aortocoronary bypass graft: Secondary | ICD-10-CM | POA: Diagnosis not present

## 2016-01-19 DIAGNOSIS — R0902 Hypoxemia: Secondary | ICD-10-CM | POA: Diagnosis present

## 2016-01-19 DIAGNOSIS — Z8049 Family history of malignant neoplasm of other genital organs: Secondary | ICD-10-CM | POA: Diagnosis not present

## 2016-01-19 DIAGNOSIS — I2510 Atherosclerotic heart disease of native coronary artery without angina pectoris: Secondary | ICD-10-CM | POA: Diagnosis present

## 2016-01-19 DIAGNOSIS — E871 Hypo-osmolality and hyponatremia: Secondary | ICD-10-CM | POA: Diagnosis present

## 2016-01-19 DIAGNOSIS — M069 Rheumatoid arthritis, unspecified: Secondary | ICD-10-CM | POA: Diagnosis present

## 2016-01-19 DIAGNOSIS — T380X5A Adverse effect of glucocorticoids and synthetic analogues, initial encounter: Secondary | ICD-10-CM | POA: Diagnosis present

## 2016-01-19 DIAGNOSIS — I10 Essential (primary) hypertension: Secondary | ICD-10-CM | POA: Diagnosis present

## 2016-01-19 DIAGNOSIS — Z87891 Personal history of nicotine dependence: Secondary | ICD-10-CM | POA: Diagnosis not present

## 2016-01-19 DIAGNOSIS — Z8711 Personal history of peptic ulcer disease: Secondary | ICD-10-CM | POA: Diagnosis not present

## 2016-01-19 DIAGNOSIS — I472 Ventricular tachycardia: Secondary | ICD-10-CM | POA: Diagnosis present

## 2016-01-19 DIAGNOSIS — E785 Hyperlipidemia, unspecified: Secondary | ICD-10-CM | POA: Diagnosis present

## 2016-01-19 DIAGNOSIS — J189 Pneumonia, unspecified organism: Secondary | ICD-10-CM | POA: Diagnosis present

## 2016-01-19 LAB — URINALYSIS, ROUTINE W REFLEX MICROSCOPIC
Bilirubin Urine: NEGATIVE
Glucose, UA: NEGATIVE mg/dL
Hgb urine dipstick: NEGATIVE
Ketones, ur: 15 mg/dL — AB
Nitrite: NEGATIVE
Protein, ur: 30 mg/dL — AB
Specific Gravity, Urine: 1.025 (ref 1.005–1.030)
pH: 6 (ref 5.0–8.0)

## 2016-01-19 LAB — VITAMIN B12: Vitamin B-12: 353 pg/mL (ref 180–914)

## 2016-01-19 LAB — URINE MICROSCOPIC-ADD ON

## 2016-01-19 LAB — TSH: TSH: 0.092 u[IU]/mL — ABNORMAL LOW (ref 0.350–4.500)

## 2016-01-19 LAB — MRSA PCR SCREENING: MRSA by PCR: NEGATIVE

## 2016-01-19 LAB — IRON AND TIBC
Iron: 20 ug/dL — ABNORMAL LOW (ref 28–170)
Saturation Ratios: 9 % — ABNORMAL LOW (ref 10.4–31.8)
TIBC: 225 ug/dL — ABNORMAL LOW (ref 250–450)
UIBC: 205 ug/dL

## 2016-01-19 LAB — FERRITIN: Ferritin: 1003 ng/mL — ABNORMAL HIGH (ref 11–307)

## 2016-01-19 LAB — BRAIN NATRIURETIC PEPTIDE: B Natriuretic Peptide: 931 pg/mL — ABNORMAL HIGH (ref 0.0–100.0)

## 2016-01-19 LAB — GLUCOSE, CAPILLARY: Glucose-Capillary: 87 mg/dL (ref 65–99)

## 2016-01-19 MED ORDER — MOMETASONE FURO-FORMOTEROL FUM 100-5 MCG/ACT IN AERO
2.0000 | INHALATION_SPRAY | Freq: Two times a day (BID) | RESPIRATORY_TRACT | Status: DC
  Administered 2016-01-19 – 2016-01-24 (×11): 2 via RESPIRATORY_TRACT
  Filled 2016-01-19: qty 8.8

## 2016-01-19 MED ORDER — POLYETHYLENE GLYCOL 3350 17 G PO PACK
17.0000 g | PACK | Freq: Every day | ORAL | Status: DC
  Filled 2016-01-19: qty 1

## 2016-01-19 MED ORDER — POLYVINYL ALCOHOL 1.4 % OP SOLN: 1.0000 [drp] | OPHTHALMIC | Status: DC | PRN

## 2016-01-19 MED ORDER — HYDROCODONE-ACETAMINOPHEN 5-325 MG PO TABS
1.0000 | ORAL_TABLET | ORAL | Status: DC | PRN
Start: 2016-01-19 — End: 2016-01-24
  Administered 2016-01-19 – 2016-01-24 (×8): 1 via ORAL
  Filled 2016-01-19 (×8): qty 1

## 2016-01-19 MED ORDER — ENOXAPARIN SODIUM 40 MG/0.4ML ~~LOC~~ SOLN
40.0000 mg | SUBCUTANEOUS | Status: DC
  Administered 2016-01-19 – 2016-01-22 (×4): 40 mg via SUBCUTANEOUS
  Filled 2016-01-19 (×4): qty 0.4

## 2016-01-19 MED ORDER — ALLOPURINOL 300 MG PO TABS
300.0000 mg | ORAL_TABLET | Freq: Every day | ORAL | Status: DC
  Administered 2016-01-19 – 2016-01-24 (×6): 300 mg via ORAL
  Filled 2016-01-19 (×6): qty 1

## 2016-01-19 MED ORDER — INSULIN ASPART 100 UNIT/ML ~~LOC~~ SOLN
0.0000 [IU] | Freq: Every day | SUBCUTANEOUS | Status: DC
  Administered 2016-01-21: 2 [IU] via SUBCUTANEOUS

## 2016-01-19 MED ORDER — ALBUTEROL SULFATE (2.5 MG/3ML) 0.083% IN NEBU
3.0000 mL | INHALATION_SOLUTION | Freq: Four times a day (QID) | RESPIRATORY_TRACT | Status: DC | PRN
  Administered 2016-01-19 – 2016-01-21 (×4): 3 mL via RESPIRATORY_TRACT
  Filled 2016-01-19 (×4): qty 3

## 2016-01-19 MED ORDER — LEVOFLOXACIN IN D5W 750 MG/150ML IV SOLN
750.0000 mg | INTRAVENOUS | Status: DC
  Administered 2016-01-19 – 2016-01-23 (×5): 750 mg via INTRAVENOUS
  Filled 2016-01-19 (×5): qty 150

## 2016-01-19 MED ORDER — ATORVASTATIN CALCIUM 40 MG PO TABS
80.0000 mg | ORAL_TABLET | Freq: Every day | ORAL | Status: DC
  Administered 2016-01-19 – 2016-01-24 (×6): 80 mg via ORAL
  Filled 2016-01-19 (×6): qty 2

## 2016-01-19 MED ORDER — METOCLOPRAMIDE HCL 10 MG PO TABS
10.0000 mg | ORAL_TABLET | Freq: Every day | ORAL | Status: DC
  Administered 2016-01-19 – 2016-01-23 (×5): 10 mg via ORAL
  Filled 2016-01-19 (×5): qty 1

## 2016-01-19 MED ORDER — INSULIN ASPART 100 UNIT/ML ~~LOC~~ SOLN
0.0000 [IU] | Freq: Three times a day (TID) | SUBCUTANEOUS | Status: DC
  Administered 2016-01-20: 2 [IU] via SUBCUTANEOUS
  Administered 2016-01-20 – 2016-01-22 (×6): 3 [IU] via SUBCUTANEOUS
  Administered 2016-01-22: 5 [IU] via SUBCUTANEOUS
  Administered 2016-01-23 (×2): 3 [IU] via SUBCUTANEOUS
  Administered 2016-01-23: 5 [IU] via SUBCUTANEOUS
  Administered 2016-01-24: 3 [IU] via SUBCUTANEOUS

## 2016-01-19 MED ORDER — GUAIFENESIN-DM 100-10 MG/5ML PO SYRP
5.0000 mL | ORAL_SOLUTION | ORAL | Status: DC | PRN
Start: 2016-01-19 — End: 2016-01-24
  Administered 2016-01-19: 5 mL via ORAL
  Filled 2016-01-19: qty 5

## 2016-01-19 MED ORDER — POTASSIUM CHLORIDE CRYS ER 20 MEQ PO TBCR
40.0000 meq | EXTENDED_RELEASE_TABLET | Freq: Every day | ORAL | Status: DC
  Administered 2016-01-19 – 2016-01-21 (×3): 40 meq via ORAL
  Filled 2016-01-19 (×3): qty 2

## 2016-01-19 MED ORDER — METOPROLOL TARTRATE 25 MG PO TABS
25.0000 mg | ORAL_TABLET | Freq: Two times a day (BID) | ORAL | Status: DC
  Administered 2016-01-19 – 2016-01-20 (×3): 25 mg via ORAL
  Filled 2016-01-19 (×3): qty 1

## 2016-01-19 MED ORDER — POLYETHYL GLYCOL-PROPYL GLYCOL 0.4-0.3 % OP GEL
Freq: Once | OPHTHALMIC | Status: DC
  Filled 2016-01-19: qty 10

## 2016-01-19 MED ORDER — ACETAMINOPHEN 325 MG PO TABS: 650.0000 mg | ORAL_TABLET | Freq: Four times a day (QID) | ORAL | Status: DC | PRN

## 2016-01-19 MED ORDER — DULOXETINE HCL 60 MG PO CPEP
60.0000 mg | ORAL_CAPSULE | Freq: Every day | ORAL | Status: DC
Start: 2016-01-19 — End: 2016-01-24
  Administered 2016-01-19 – 2016-01-24 (×6): 60 mg via ORAL
  Filled 2016-01-19 (×6): qty 1

## 2016-01-19 MED ORDER — POTASSIUM CHLORIDE IN NACL 20-0.9 MEQ/L-% IV SOLN
INTRAVENOUS | Status: DC
  Administered 2016-01-19 – 2016-01-21 (×2): via INTRAVENOUS

## 2016-01-19 MED ORDER — POLYETHYLENE GLYCOL 3350 17 G PO PACK
17.0000 g | PACK | Freq: Every day | ORAL | Status: DC
  Administered 2016-01-19 – 2016-01-20 (×2): 17 g via ORAL
  Filled 2016-01-19 (×3): qty 1

## 2016-01-19 MED ORDER — DOCUSATE SODIUM 100 MG PO CAPS
100.0000 mg | ORAL_CAPSULE | Freq: Two times a day (BID) | ORAL | Status: DC
  Administered 2016-01-20 – 2016-01-23 (×6): 100 mg via ORAL
  Filled 2016-01-19 (×6): qty 1

## 2016-01-19 MED ORDER — OMEGA-3-ACID ETHYL ESTERS 1 G PO CAPS
1.0000 g | ORAL_CAPSULE | Freq: Every day | ORAL | Status: DC
  Administered 2016-01-19 – 2016-01-24 (×6): 1 g via ORAL
  Filled 2016-01-19 (×6): qty 1

## 2016-01-19 MED ORDER — PANTOPRAZOLE SODIUM 40 MG PO TBEC
40.0000 mg | DELAYED_RELEASE_TABLET | Freq: Every day | ORAL | Status: DC
Start: 2016-01-19 — End: 2016-01-24
  Administered 2016-01-19 – 2016-01-24 (×6): 40 mg via ORAL
  Filled 2016-01-19 (×6): qty 1

## 2016-01-19 MED ORDER — ACETAMINOPHEN 650 MG RE SUPP: 650.0000 mg | Freq: Four times a day (QID) | RECTAL | Status: DC | PRN

## 2016-01-19 MED ORDER — FLUTICASONE PROPIONATE 50 MCG/ACT NA SUSP
2.0000 | Freq: Every day | NASAL | Status: DC
  Administered 2016-01-19 – 2016-01-24 (×6): 2 via NASAL
  Filled 2016-01-19: qty 16

## 2016-01-19 MED ORDER — MORPHINE SULFATE (PF) 2 MG/ML IV SOLN
2.0000 mg | INTRAVENOUS | Status: DC | PRN
Start: 2016-01-19 — End: 2016-01-24
  Administered 2016-01-19 – 2016-01-23 (×19): 2 mg via INTRAVENOUS
  Filled 2016-01-19 (×19): qty 1

## 2016-01-19 MED ORDER — LOSARTAN POTASSIUM 50 MG PO TABS
100.0000 mg | ORAL_TABLET | Freq: Every day | ORAL | Status: DC
  Administered 2016-01-19 – 2016-01-20 (×2): 100 mg via ORAL
  Filled 2016-01-19 (×2): qty 2

## 2016-01-19 NOTE — H&P (Signed)
[facility] History and Physical  Katherine Larson [id number] DOB: 1946/01/13    PCP:   [name], MD   Chief Complaint: malaise, coughs, and fever.   HPI: Katherine Larson is an 70 y.o. female with hx of NSVT, DM2, CAD, HTN, HLD, asthma, RA, gout, recently diagnosed with Influenza, and finished her 5 days of Tamiflu, presented to the ER tonight with general malaise, myalgia, coughs, fever and chills.  She was SOB, but denied CP.  Work up in the ER included a CXR which showed bilateral airspace disease, WBC of 10.8K, K of 3.3, and normal renal Fx.  She was given IV Lucianne Lei and Levoquin in the ER, and IVF.  ABG 7.5/27/POx=81 on .28 % oxygen. Her Lactic acid was normal.  IVF was given, and hospitalist was asked to admit her for PNA with possible early sepsis.   Rewiew of Systems:  Constitutional: Negative for malaise, fever and chills. No significant weight loss or weight gain Eyes: Negative for eye pain, redness and discharge, diplopia, visual changes, or flashes of light. ENMT: Negative for ear pain, hoarseness, nasal congestion, sinus pressure and sore throat. No headaches; tinnitus, drooling, or problem swallowing. Cardiovascular: Negative for chest pain, palpitations, diaphoresis,  and peripheral edema. ; No orthopnea, PND Respiratory: Negative for  hemoptysis, wheezing and stridor. No pleuritic chestpain. Gastrointestinal: Negative for nausea, vomiting, diarrhea, constipation, abdominal pain, melena, blood in stool, hematemesis, jaundice and rectal bleeding.    Genitourinary: Negative for frequency, dysuria, incontinence,flank pain and hematuria; Musculoskeletal: Negative for back pain and neck pain. Negative for swelling and trauma.;  Skin: . Negative for pruritus, rash, abrasions, bruising and skin lesion.; ulcerations Neuro: Negative for headache, lightheadedness and neck stiffness. Negative for weakness, altered level of consciousness , altered mental status, burning feet,  involuntary movement, seizure and syncope.  Psych: negative for anxiety, depression, insomnia, tearfulness, panic attacks, hallucinations, paranoia, suicidal or homicidal ideation    Past Medical History  Diagnosis Date  . NSVT (nonsustained ventricular tachycardia) ([place])   . DM2 (diabetes mellitus, type 2) ([place])   . CAD (coronary artery disease)     s/p CABG 1996;  Myoview 3/11: mod partially reversible AS perfusion defect, most likely related to variable breast attenuation although an element of scar with peri-infarct ischemia also possible, EF 54% - felt to be low risk  . HTN (hypertension)   . HLD (hyperlipidemia)   . Asthma   . Neuropathy ([place])   . Complication of anesthesia   . PONV (postoperative nausea and vomiting)   . Shortness of breath     with activity  . Seasonal allergies   . GERD (gastroesophageal reflux disease)   . Constipation   . Diarrhea   . Rheumatoid arthritis ([facility])     RA  . Gout   . Anemia     anemia of chronic disease +/- IDA followed by Dr. Earlie Server. received iron infusions in the past  . Borderline glaucoma     Past Surgical History  Procedure Laterality Date  . Cholecystectomy    . Ptca    . Coronary artery bypass graft  1996    2 vessels  . Hernia repair      hiatal hernia  . Eye surgery Bilateral   . Abdominal hysterectomy    . Fracture surgery Right     arm  . Maximum access (mas)posterior lumbar interbody fusion (plif) 1 level N/A 08/14/2013    Procedure:  MAXIMUM ACCESS SURGERY(MAS) POSTERIOR LUMBAR INTERBODY FUSION LUMBAR THREE-FOUR ;  Surgeon: Eustace Moore, MD;  Location: [facility]URO ORS;  Service: Neurosurgery;  Laterality: N/A;   MAXIMUM ACCESS SURGERY(MAS) POSTERIOR LUMBAR INTERBODY FUSION LUMBAR THREE-FOUR   . Appendectomy    . Colonoscopy  11/2011    Dr. Magod:ext/int hemorrhoids, diverticulosis sigmoid colon and distal desc colon, mid-desc colon hyperplastic polyps.   . Esophagogastroduodenoscopy  11/2011    Dr. Watt Climes: small hiatal  hernia, one non-bleeding superficial gastric ulcer, medium-sized diverticulum in area of papilla  . Givens capsule study  11/2012    Dr. Watt Climes: minimal gastritis, normal small bowel capsule  . Esophagogastroduodenoscopy N/A 12/29/2015    Procedure: ESOPHAGOGASTRODUODENOSCOPY (EGD);  Surgeon: Daneil Dolin, MD;  Location: [facility];  Service: Endoscopy;  Laterality: N/A;  0930 - moved to 2/8 @ 1:45 - office to notify pt    Medications:  HOME MEDS: Prior to Admission medications   Medication Sig Start Date End Date Taking? Authorizing Provider  albuterol (PROVENTIL HFA;VENTOLIN HFA) 108 (90 BASE) MCG/ACT inhaler Inhale 2 puffs into the lungs every 6 (six) hours as needed for wheezing or shortness of breath.   Yes Historical Provider, MD  allopurinol (ZYLOPRIM) 300 MG tablet Take 300 mg by mouth daily.   Yes Historical Provider, MD  amitriptyline (ELAVIL) 10 MG tablet Take 20 mg by mouth at bedtime.    Yes Historical Provider, MD  atorvastatin (LIPITOR) 80 MG tablet Take 80 mg by mouth daily.   Yes Historical Provider, MD  docusate sodium (COLACE) 100 MG capsule Take 1 capsule (100 mg total) by mouth every 12 (twelve) hours. 01/09/16  Yes Kristen N Ward, DO  DULoxetine (CYMBALTA) 30 MG capsule Take 60 mg by mouth daily.   Yes Historical Provider, MD  fexofenadine (ALLEGRA) 180 MG tablet Take 180 mg by mouth daily.     Yes Historical Provider, MD  fluticasone (FLONASE) 50 MCG/ACT nasal spray Place 2 sprays into both nostrils daily.   Yes Historical Provider, MD  furosemide (LASIX) 20 MG tablet Take 20 mg by mouth daily.   Yes Historical Provider, MD  hydroxychloroquine (PLAQUENIL) 200 MG tablet Take 400 mg by mouth daily.   Yes Historical Provider, MD  iron polysaccharides (NIFEREX) 150 MG capsule Take 150 mg by mouth daily.   Yes Historical Provider, MD  leflunomide (ARAVA) 20 MG tablet Take 20 mg by mouth daily.   Yes Historical Provider, MD  losartan (COZAAR) 100 MG tablet Take 100 mg by  mouth daily.   Yes Historical Provider, MD  metoCLOPramide (REGLAN) 5 MG tablet Take 10 mg by mouth at bedtime.    Yes Historical Provider, MD  metoprolol tartrate (LOPRESSOR) 25 MG tablet TAKE 3 TABLETS(75 MG) BY MOUTH TWICE DAILY 09/13/15  Yes Josue Hector, MD  mometasone-formoterol [facility]) 100-5 MCG/ACT AERO Inhale 2 puffs into the lungs daily as needed for wheezing or shortness of breath.    Yes Historical Provider, MD  montelukast (SINGULAIR) 10 MG tablet Take 10 mg by mouth at bedtime.   Yes Historical Provider, MD  Multiple Vitamins-Minerals (MACULAR VITAMIN BENEFIT PO) Take 2 tablets by mouth daily.   Yes Historical Provider, MD  nitroGLYCERIN (NITROSTAT) 0.4 MG SL tablet Place 0.4 mg under the tongue every 5 (five) minutes as needed for chest pain. Reported on 11/08/2015   Yes Historical Provider, MD  Omega-3 Fatty Acids (FISH OIL) 1000 MG CAPS Take 1,000 mg by mouth 3 (three) times daily with meals.   Yes Historical Provider, MD  omeprazole (PRILOSEC) 20 MG capsule Take  20 mg by mouth 2 (two) times daily before a meal.   Yes Historical Provider, MD  Polyethyl Glycol-Propyl Glycol (SYSTANE OP) Apply 1 drop to eye 3 (three) times daily as needed (Dry Eyes).   Yes Historical Provider, MD  polyethylene glycol (MIRALAX) packet Take 17 g by mouth daily. 01/09/16  Yes Kristen N Ward, DO  sucralfate (CARAFATE) 1 GM/10ML suspension Take 10 mLs (1 g total) by mouth 4 (four) times daily. 12/22/15  Yes Mahala Menghini, PA-C  Tapentadol HCl (NUCYNTA) 75 MG TABS Take 75 mg by mouth every 8 (eight) hours as needed (for pain).   Yes Historical Provider, MD  triamcinolone cream (KENALOG) 0.1 % Apply 1 application topically daily as needed (for irritation).    Yes Historical Provider, MD  HYDROcodone-acetaminophen (NORCO/VICODIN) 5-325 MG tablet Take 1 tablet by mouth every 8 (eight) hours as needed. 01/07/16   Historical Provider, MD  oseltamivir (TAMIFLU) 75 MG capsule Take 75 mg by mouth daily. Reported on  01/18/2016 01/13/16   Historical Provider, MD     Allergies:  Allergies  Allergen Reactions  . Biaxin [Clarithromycin] Rash and Other (See Comments)    Blisters in mouth   . Penicillins Rash and Other (See Comments)    Social History:   reports that she quit smoking about 21 years ago. She does not have any smokeless tobacco history on file. She reports that she does not drink alcohol or use illicit drugs.  Family History: Family History  Problem Relation Age of Onset  . Heart attack Father   . Heart attack Mother   . Colon cancer Neg Hx   . Bladder Cancer Brother   . Cervical cancer Daughter     ?     Physical Exam: Filed Vitals:   01/18/16 2200 01/18/16 2230 01/18/16 2251 01/18/16 2300  BP: 145/79 152/75  135/67  Pulse: 132 120  120  Temp:  99.8 F (37.7 C)    TempSrc:  Oral    Resp:    24  Height:      Weight:      SpO2: 90% 92% 95% 92%   Blood pressure 135/67, pulse 120, temperature 99.8 F (37.7 C), temperature source Oral, resp. rate 24, height 5\' 4"  (1.626 m), weight 63.504 kg (140 lb), SpO2 92 %.  GEN:  Pleasant patient lying in the stretcher in no acute distress; cooperative with exam. PSYCH:  alert and oriented x4; does not appear anxious or depressed; affect is appropriate. HEENT: Mucous membranes pink and anicteric; PERRLA; EOM intact; no cervical lymphadenopathy nor thyromegaly or carotid bruit; no JVD; There were no stridor. Neck is very supple. Breasts:: Not examined CHEST WALL: No tenderness CHEST: Normal respiration, clear to auscultation bilaterally.  HEART: Regular rhythm but tachycardic.  There are no murmur, rub, or gallops.   BACK: No kyphosis or scoliosis; no CVA tenderness ABDOMEN: soft and non-tender; no masses, no organomegaly, normal abdominal bowel sounds; no pannus; no intertriginous candida. There is no rebound and no distention. Rectal Exam: Not done EXTREMITIES: No bone or joint deformity; age-appropriate arthropathy of the hands and  knees; no edema; no ulcerations.  There is no calf tenderness. Genitalia: not examined PULSES: 2+ and symmetric SKIN: Normal hydration no rash or ulceration CNS: Cranial nerves 2-12 grossly intact no focal lateralizing neurologic deficit.  Speech is fluent; uvula elevated with phonation, facial symmetry and tongue midline. DTR are normal bilaterally, cerebella exam is intact, barbinski is negative and strengths are equaled bilaterally.  No  sensory loss.   Labs on Admission:  Basic Metabolic Panel:  Recent Labs Lab 01/18/16 2140  NA 130*  K 3.3*  CL 96*  CO2 22  GLUCOSE 187*  BUN 10  CREATININE 0.54  CALCIUM 8.0*   Liver Function Tests:  Recent Labs Lab 01/18/16 2140  AST 24  ALT 23  ALKPHOS 99  BILITOT 0.6  PROT 6.7  ALBUMIN 2.9*    Recent Labs Lab 01/18/16 2140  LIPASE 18   CBC:  Recent Labs Lab 01/18/16 2140  WBC 10.8*  HGB 11.4*  HCT 32.6*  MCV 88.8  PLT 191   Radiological Exams on Admission: Dg Chest 2 View  01/18/2016  CLINICAL DATA:  70 year old female with flu-like symptoms and fever. EXAM: CHEST  2 VIEW COMPARISON:  CT dated 09/02/2015 FINDINGS: Two views of the chest demonstrate bibasilar airspace densities most compatible with pneumonia. Clinical correlation and follow-up to resolution recommended. There is no pleural effusion or pneumothorax. The cardiac silhouette is within normal limits. Median sternotomy wires and CABG vascular clips noted. There is osteopenia with degenerative changes of the spine. No acute fracture. IMPRESSION: Bibasilar airspace densities most compatible with pneumonia. Follow-up recommended. Electronically Signed   By: Anner Crete M.D.   On: 01/18/2016 22:23    EKG: Independently reviewed.    Assessment/Plan Present on Admission:  . CAP (community acquired pneumonia) . Atrial tachycardia ([place]) . GERD (gastroesophageal reflux disease) . HTN (hypertension)   PLAN:   CAP:  Will continue with IVF and IV Levoquin.   BC were obtained.  Her BP was OK.  HTN:  BP is OK, will continue with her anti HTN meds.  Influenza:  She has finished her Tamiflu course.   Hypokalemia:  Will continue with supplement.   Other plans as per orders. Code Status: FULL Haskel Khan, MD. FACP [facility] Pager [phone] 7pm to 7am.  01/19/2016, 12:07 AM

## 2016-01-19 NOTE — Progress Notes (Addendum)
Patient is a 70 year old woman with a history of NSVT, DM 2, CAD, and recent treatment with Tamiflu for influenza, who was admitted by Dr. Marin Comment this morning for shortness of breath. Patient was found to have pneumonia. She was briefly seen and examined. Her chart, vital signs, laboratory studies were reviewed. Agree with management with additions below.  -Due to the patient's hyponatremia, will start gentle IV fluids with normal saline. -We'll order an anemia panel and TSH for further evaluation. -Patient may have a UTI which will be covered by Levaquin already started for pneumonia. -We'll start sliding scale NovoLog for hyperglycemia/diabetes mellitus.

## 2016-01-19 NOTE — ED Notes (Signed)
Per hospitalist pt tested and treated for flu

## 2016-01-19 NOTE — ED Notes (Signed)
Patient assisted with bedpan, tolerated well 

## 2016-01-19 NOTE — Progress Notes (Signed)
Pharmacy Antibiotic Note  Katherine Larson is a 70 y.o. female admitted on 01/18/2016 with pneumonia.  Pharmacy has been consulted for [facility] dosing.  Plan: Levaquin 750mg  IV q24hrs Monitor labs, renal fxn, progress and c/s  Height: 5' 4.5" (163.8 cm) Weight: 139 lb 15.9 oz (63.5 kg) IBW/kg (Calculated) : 55.85  Temp (24hrs), Avg:98.9 F (37.2 C), Min:97.7 F (36.5 C), Max:99.8 F (37.7 C)   Recent Labs Lab 01/18/16 2140 01/18/16 2314  WBC 10.8*  --   CREATININE 0.54  --   LATICACIDVEN  --  1.26    Estimated Creatinine Clearance: 59.4 mL/min (by C-G formula based on Cr of 0.54).    Allergies  Allergen Reactions  . Biaxin [Clarithromycin] Rash and Other (See Comments)    Blisters in mouth   . Penicillins Rash and Other (See Comments)    Blisters in mouth Has patient had a PCN reaction causing immediate rash, facial/tongue/throat swelling, SOB or lightheadedness with hypotension: yes Has patient had a PCN reaction causing severe rash involving mucus membranes or skin necrosis: no Has patient had a PCN reaction that required hospitalization no Has patient had a PCN reaction occurring within the last 10 years: no If all of the above answers are "NO", then may proceed   Anti-infectives    Start     Dose/Rate Route Frequency Ordered Stop   01/19/16 2100  levofloxacin (LEVAQUIN) IVPB 750 mg     750 mg 100 mL/hr over 90 Minutes Intravenous Every 24 hours 01/19/16 0733     01/18/16 2300  levofloxacin (LEVAQUIN) IVPB 750 mg     750 mg 100 mL/hr over 90 Minutes Intravenous  Once 01/18/16 2253 01/19/16 0045   01/18/16 2300  vancomycin (VANCOCIN) IVPB 1000 mg/200 mL premix     1,000 mg 200 mL/hr over 60 Minutes Intravenous  Once 01/18/16 2253 01/19/16 0233     Dose adjustments this admission: n/a  Microbiology results: 3/1 BCx: pending 3/1 UCx: pending  3/1 MRSA PCR: NEGATIVE  Thank you for allowing pharmacy to be a part of this patient's care.  Hart Robinsons  A 01/19/2016 10:27 AM

## 2016-01-19 NOTE — Care Management Note (Signed)
Case Management Note  Patient Details  Name: Katherine Larson MRN: [id number] Date of Birth: 22-Apr-1946  Subjective/Objective:                  Pt admitted with CAP. Pt is from home, lives with husband and is ind with ADL's. Pt has cane and walker to use if needed but was not using them prior to admission. Pt has no HH or home O2/neb prior to admission. Pt has PCP and transportation.   Action/Plan: Pt plans to return home with self care. Will cont to follow for DC planning.    Expected Discharge Date:    01/22/2016              Expected Discharge Plan:  Home/Self Care  In-House Referral:  NA  Discharge planning Services  CM Consult  Post Acute Care Choice:  NA Choice offered to:  NA  DME Arranged:    DME Agency:     HH Arranged:    HH Agency:     Status of Service:  In process, will continue to follow  Medicare Important Message Given:    Date Medicare IM Given:    Medicare IM give by:    Date Additional Medicare IM Given:    Additional Medicare Important Message give by:     If discussed at [place] of Stay Meetings, dates discussed:    Additional Comments:  Sherald Barge, RN 01/19/2016, 3:15 PM

## 2016-01-20 DIAGNOSIS — E119 Type 2 diabetes mellitus without complications: Secondary | ICD-10-CM

## 2016-01-20 DIAGNOSIS — I10 Essential (primary) hypertension: Secondary | ICD-10-CM

## 2016-01-20 DIAGNOSIS — I011 Acute rheumatic endocarditis: Secondary | ICD-10-CM | POA: Diagnosis present

## 2016-01-20 DIAGNOSIS — R000 Tachycardia, unspecified: Secondary | ICD-10-CM

## 2016-01-20 DIAGNOSIS — E876 Hypokalemia: Secondary | ICD-10-CM

## 2016-01-20 DIAGNOSIS — E871 Hypo-osmolality and hyponatremia: Secondary | ICD-10-CM

## 2016-01-20 LAB — BASIC METABOLIC PANEL
Anion gap: 10 (ref 5–15)
BUN: 6 mg/dL (ref 6–20)
CO2: 23 mmol/L (ref 22–32)
Calcium: 8 mg/dL — ABNORMAL LOW (ref 8.9–10.3)
Chloride: 106 mmol/L (ref 101–111)
Creatinine, Ser: 0.39 mg/dL — ABNORMAL LOW (ref 0.44–1.00)
GFR calc Af Amer: >60 mL/min (ref >60)
GFR calc non Af Amer: >60 mL/min (ref >60)
Glucose, Bld: 126 mg/dL — ABNORMAL HIGH (ref 65–99)
Potassium: 3.6 mmol/L (ref 3.5–5.1)
Sodium: 139 mmol/L (ref 135–145)

## 2016-01-20 LAB — CBC
HCT: 32.3 % — ABNORMAL LOW (ref 36.0–46.0)
Hemoglobin: 10.9 g/dL — ABNORMAL LOW (ref 12.0–15.0)
MCH: 30.8 pg (ref 26.0–34.0)
MCHC: 33.7 g/dL (ref 30.0–36.0)
MCV: 91.2 fL (ref 78.0–100.0)
Platelets: 273 10*3/uL (ref 150–400)
RBC: 3.54 MIL/uL — ABNORMAL LOW (ref 3.87–5.11)
RDW: 14.4 % (ref 11.5–15.5)
WBC: 11.1 10*3/uL — ABNORMAL HIGH (ref 4.0–10.5)

## 2016-01-20 LAB — HEMOGLOBIN A1C
Hgb A1c MFr Bld: 5.8 % — ABNORMAL HIGH (ref 4.8–5.6)
Mean Plasma Glucose: 120 mg/dL

## 2016-01-20 LAB — GLUCOSE, CAPILLARY
Glucose-Capillary: 120 mg/dL — ABNORMAL HIGH (ref 65–99)
Glucose-Capillary: 111 mg/dL — ABNORMAL HIGH (ref 65–99)
Glucose-Capillary: 120 mg/dL — ABNORMAL HIGH (ref 65–99)
Glucose-Capillary: 106 mg/dL — ABNORMAL HIGH (ref 65–99)
Glucose-Capillary: 121 mg/dL — ABNORMAL HIGH (ref 65–99)
Glucose-Capillary: 156 mg/dL — ABNORMAL HIGH (ref 65–99)
Glucose-Capillary: 172 mg/dL — ABNORMAL HIGH (ref 65–99)

## 2016-01-20 LAB — URINE CULTURE

## 2016-01-20 LAB — T4, FREE: Free T4: 2.08 ng/dL — ABNORMAL HIGH (ref 0.61–1.12)

## 2016-01-20 MED ORDER — LOSARTAN POTASSIUM 50 MG PO TABS
50.0000 mg | ORAL_TABLET | Freq: Every day | ORAL | Status: DC
  Administered 2016-01-21 – 2016-01-24 (×4): 50 mg via ORAL
  Filled 2016-01-20 (×4): qty 1

## 2016-01-20 MED ORDER — METOPROLOL TARTRATE 50 MG PO TABS
50.0000 mg | ORAL_TABLET | Freq: Two times a day (BID) | ORAL | Status: DC
  Administered 2016-01-20 – 2016-01-21 (×2): 50 mg via ORAL
  Filled 2016-01-20 (×2): qty 1

## 2016-01-20 MED ORDER — METHYLPREDNISOLONE SODIUM SUCC 40 MG IJ SOLR
40.0000 mg | Freq: Three times a day (TID) | INTRAMUSCULAR | Status: DC
  Administered 2016-01-20 – 2016-01-21 (×3): 40 mg via INTRAVENOUS
  Filled 2016-01-20 (×3): qty 1

## 2016-01-20 NOTE — Progress Notes (Addendum)
[facility] PROGRESS NOTE  Katherine Larson [id number] DOB: 09-Feb-1946 DOA: 01/18/2016 PCP: [name], MD    Code Status: Full code Family Communication: Family not available Disposition Plan: Discharge when clinically appropriate, possibly in 2-3 days.   Consultants:  None  Procedures:  None  Antibiotics:  IV Levaquin 01/19/16>>  HPI/Subjective: Patient says that she is breathing a little better, but still has some chest congestion. She complains of low back pain which is chronic.  Objective: Filed Vitals:   01/20/16 0600 01/20/16 0821  BP: 127/81   Pulse: 129   Temp:  97.1 F (36.2 C)  Resp: 33   02 sat 97% on Delta 02  Intake/Output Summary (Last 24 hours) at 01/20/16 0946 Last data filed at 01/20/16 [id number]  Gross per 24 hour  Intake   1994 ml  Output    125 ml  Net   1869 ml   Filed Weights   01/18/16 2112 01/19/16 0430 01/20/16 0500  Weight: 63.504 kg (140 lb) 63.5 kg (139 lb 15.9 oz) 64.3 kg (141 lb 12.1 oz)    Exam:   General:  Ill-appearing [name] in no acute distress.  Cardiovascular: S1, S2, with tachycardia.  Respiratory: Few scattered crackles and upper airway wheezes. Breathing nonlabored at rest.  Abdomen: Positive bowel sounds, soft, nontender, nondistended.  Musculoskeletal/extremities: Mild tenderness of her lower back with no acute hot red joints; no pedal edema.  Neurologic: She is alert and oriented 3. Cranial nerves II through XII grossly intact.   Data Reviewed: Basic Metabolic Panel:  Recent Labs Lab 01/18/16 2140 01/20/16 0455  NA 130* 139  K 3.3* 3.6  CL 96* 106  CO2 22 23  GLUCOSE 187* 126*  BUN 10 6  CREATININE 0.54 0.39*  CALCIUM 8.0* 8.0*   Liver Function Tests:  Recent Labs Lab 01/18/16 2140  AST 24  ALT 23  ALKPHOS 99  BILITOT 0.6  PROT 6.7  ALBUMIN 2.9*    Recent Labs Lab 01/18/16 2140  LIPASE 18   No results for input(s): AMMONIA in the last 168  hours. CBC:  Recent Labs Lab 01/18/16 2140 01/20/16 0455  WBC 10.8* 11.1*  HGB 11.4* 10.9*  HCT 32.6* 32.3*  MCV 88.8 91.2  PLT 191 273   Cardiac Enzymes: No results for input(s): CKTOTAL, CKMB, CKMBINDEX, TROPONINI in the last 168 hours. BNP (last 3 results)  Recent Labs  01/19/16 1040  BNP 931.0*    ProBNP (last 3 results) No results for input(s): PROBNP in the last 8760 hours.  CBG:  Recent Labs Lab 01/19/16 0746 01/19/16 1130 01/19/16 1528 01/19/16 2140 01/20/16 0729  GLUCAP 87 120* 111* 120* 106*    Recent Results (from the past 240 hour(s))  Blood Culture (routine x 2)     Status: None (Preliminary result)   Collection Time: 01/18/16 10:58 PM  Result Value Ref Range Status   Specimen Description BLOOD LEFT ANTECUBITAL  Final   Special Requests BOTTLES DRAWN AEROBIC AND ANAEROBIC 8CC EACH  Final   Culture NO GROWTH < 12 HOURS  Final   Report Status PENDING  Incomplete  Blood Culture (routine x 2)     Status: None (Preliminary result)   Collection Time: 01/18/16 11:15 PM  Result Value Ref Range Status   Specimen Description BLOOD RIGHT FOREARM  Final   Special Requests BOTTLES DRAWN AEROBIC AND ANAEROBIC 6CC EACH  Final   Culture NO GROWTH < 12 HOURS  Final   Report Status PENDING  Incomplete  MRSA PCR Screening     Status: None   Collection Time: 01/19/16  4:37 AM  Result Value Ref Range Status   MRSA by PCR NEGATIVE NEGATIVE Final    Comment:        The GeneXpert MRSA Assay (FDA approved for NASAL specimens only), is one component of a comprehensive MRSA colonization surveillance program. It is not intended to diagnose MRSA infection nor to guide or monitor treatment for MRSA infections.      Studies: Dg Chest 2 View  01/18/2016  CLINICAL DATA:  70 year old female with flu-like symptoms and fever. EXAM: CHEST  2 VIEW COMPARISON:  CT dated 09/02/2015 FINDINGS: Two views of the chest demonstrate bibasilar airspace densities most compatible  with pneumonia. Clinical correlation and follow-up to resolution recommended. There is no pleural effusion or pneumothorax. The cardiac silhouette is within normal limits. Median sternotomy wires and CABG vascular clips noted. There is osteopenia with degenerative changes of the spine. No acute fracture. IMPRESSION: Bibasilar airspace densities most compatible with pneumonia. Follow-up recommended. Electronically Signed   By: Anner Crete M.D.   On: 01/18/2016 22:23    Scheduled Meds: . allopurinol  300 mg Oral Daily  . atorvastatin  80 mg Oral Daily  . docusate sodium  100 mg Oral Q12H  . DULoxetine  60 mg Oral Daily  . enoxaparin (LOVENOX) injection  40 mg Subcutaneous Q24H  . fluticasone  2 spray Each Nare Daily  . insulin aspart  0-15 Units Subcutaneous TID WC  . insulin aspart  0-5 Units Subcutaneous QHS  . levofloxacin (LEVAQUIN) IV  750 mg Intravenous Q24H  . losartan  100 mg Oral Daily  . metoCLOPramide  10 mg Oral QHS  . metoprolol tartrate  25 mg Oral BID  . mometasone-formoterol  2 puff Inhalation BID  . omega-3 acid ethyl esters  1 g Oral Daily  . pantoprazole  40 mg Oral Daily  . polyethylene glycol  17 g Oral QHS  . potassium chloride  40 mEq Oral Daily   Continuous Infusions: . 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 Community acquired pneumonia. On admission, the patient's chest x-ray revealed bibasilar airspace densities compatible with pneumonia. She was borderline febrile in the ED. Her white blood cell count was marginally elevated. She had recently completed a course of Tamiflu for influenza. She was  started on IV Levaquin. Oxygen was started and titrated to keep her oxygen saturations greater than 90%. She is still symptomatic and has a few wheezes on exam.   Possible COPD with mild exacerbation. Patient has a few wheezes on exam, which could be secondary to pneumonia. However, she uses Dulera and albuterol inhaler chronically. She also takes Singulair. She denies COPD, but may have chronic asthmatic bronchitis. Dulera inhaler was started on admission. Will restart Singulair. -We'll start Xopenex nebulizer cautiously in the setting of sinus tachycardia. Will add small dosing of IV Solu-Medrol.  Sinus tachycardia. Patient was noted to have tachycardia in the ED and it has persisted. EKG today reveals sinus tachycardia with a heart rate of 110 bpm. She was started on  gentle IV fluids for possible mild volume depletion. The dose of metoprolol was decreased, so the tachycardia may be secondary to rebound effects. She also complains of low back pain which may increase her heart rate. -TSH was ordered and was low at 0.92. Will order free T4 and free T3. ? Hyperthyroidism -We will increase metoprolol dosing 50 mg twice a day (her home dose of 75 mg daily).  Hypertension. The patient is treated with metoprolol and Cozaar chronically. Her blood pressures have been low normal, but is improving. -We will increase metoprolol to 50 mg twice a day and decrease Cozaar from 100-50 mg daily due to her persistent tachycardia.  Diet-controlled diabetes mellitus. Sliding scale NovoLog was started for an elevated blood glucose. Her hemoglobin A1c was 5.8. -We'll adjust insulin with the start of IV Solu-Medrol.  Hyponatremia. Patient serum sodium was 130 on admission. She was started on normal saline infusion. Her serum sodium has improved. Will continue gentle IV fluids. Hypokalemia. Patient's serum potassium was 3.3 on admission. She was started on potassium chloride. Her serum potassium has  improved.  Normocytic anemia. Patient's hemoglobin ranges from 10.9-11. Anemia panel reveals a total iron of 20, TIBC of 225, ferritin of 1003, and vitamin B12 of 353. -We'll restart iron supplementation at the time of discharge.  Rheumatoid arthritis. The patient has chronic pain from RA. She is treated with Plaquenil and Arava. Both are being held due to her infection. We'll treat her pain with as needed opiates.      Time spent: 35 minutes     [facility] Pager [phone]. If 7PM-7AM, please contact night-coverage at [email or web address], password [facility] 01/20/2016, 9:46 AM  LOS: 1 day

## 2016-01-20 NOTE — Progress Notes (Signed)
**Note De-Identified Mitchell Iwanicki Obfuscation** Abnormal ABG results reported to MD

## 2016-01-21 ENCOUNTER — Inpatient Hospital Stay (HOSPITAL_COMMUNITY): Payer: Medicare Other

## 2016-01-21 ENCOUNTER — Encounter (HOSPITAL_COMMUNITY): Payer: Self-pay | Admitting: Internal Medicine

## 2016-01-21 DIAGNOSIS — J441 Chronic obstructive pulmonary disease with (acute) exacerbation: Secondary | ICD-10-CM

## 2016-01-21 DIAGNOSIS — E0590 Thyrotoxicosis, unspecified without thyrotoxic crisis or storm: Secondary | ICD-10-CM

## 2016-01-21 DIAGNOSIS — J189 Pneumonia, unspecified organism: Secondary | ICD-10-CM

## 2016-01-21 HISTORY — DX: Thyrotoxicosis, unspecified without thyrotoxic crisis or storm: E05.90

## 2016-01-21 LAB — CBC
HCT: 32.1 % — ABNORMAL LOW (ref 36.0–46.0)
Hemoglobin: 10.8 g/dL — ABNORMAL LOW (ref 12.0–15.0)
MCH: 30.9 pg (ref 26.0–34.0)
MCHC: 33.6 g/dL (ref 30.0–36.0)
MCV: 92 fL (ref 78.0–100.0)
Platelets: 306 10*3/uL (ref 150–400)
RBC: 3.49 MIL/uL — ABNORMAL LOW (ref 3.87–5.11)
RDW: 14.6 % (ref 11.5–15.5)
WBC: 5.5 10*3/uL (ref 4.0–10.5)

## 2016-01-21 LAB — BASIC METABOLIC PANEL
Anion gap: 9 (ref 5–15)
BUN: 8 mg/dL (ref 6–20)
CO2: 24 mmol/L (ref 22–32)
Calcium: 8.1 mg/dL — ABNORMAL LOW (ref 8.9–10.3)
Chloride: 106 mmol/L (ref 101–111)
Creatinine, Ser: 0.4 mg/dL — ABNORMAL LOW (ref 0.44–1.00)
GFR calc Af Amer: >60 mL/min (ref >60)
GFR calc non Af Amer: >60 mL/min (ref >60)
Glucose, Bld: 152 mg/dL — ABNORMAL HIGH (ref 65–99)
Potassium: 4.5 mmol/L (ref 3.5–5.1)
Sodium: 139 mmol/L (ref 135–145)

## 2016-01-21 LAB — T3, FREE: T3, Free: 2.5 pg/mL (ref 2.0–4.4)

## 2016-01-21 LAB — GLUCOSE, CAPILLARY
Glucose-Capillary: 158 mg/dL — ABNORMAL HIGH (ref 65–99)
Glucose-Capillary: 185 mg/dL — ABNORMAL HIGH (ref 65–99)
Glucose-Capillary: 166 mg/dL — ABNORMAL HIGH (ref 65–99)
Glucose-Capillary: 207 mg/dL — ABNORMAL HIGH (ref 65–99)

## 2016-01-21 MED ORDER — DILTIAZEM HCL 30 MG PO TABS
30.0000 mg | ORAL_TABLET | Freq: Three times a day (TID) | ORAL | Status: DC
  Administered 2016-01-21 – 2016-01-23 (×6): 30 mg via ORAL
  Filled 2016-01-21 (×6): qty 1

## 2016-01-21 MED ORDER — TIOTROPIUM BROMIDE MONOHYDRATE 18 MCG IN CAPS
18.0000 ug | ORAL_CAPSULE | Freq: Every day | RESPIRATORY_TRACT | Status: DC
  Administered 2016-01-21 – 2016-01-24 (×4): 18 ug via RESPIRATORY_TRACT
  Filled 2016-01-21: qty 5

## 2016-01-21 MED ORDER — METHYLPREDNISOLONE SODIUM SUCC 125 MG IJ SOLR
60.0000 mg | Freq: Three times a day (TID) | INTRAMUSCULAR | Status: DC
Start: 2016-01-21 — End: 2016-01-24
  Administered 2016-01-21 – 2016-01-24 (×9): 60 mg via INTRAVENOUS
  Filled 2016-01-21 (×9): qty 2

## 2016-01-21 MED ORDER — MONTELUKAST SODIUM 10 MG PO TABS
10.0000 mg | ORAL_TABLET | Freq: Every day | ORAL | Status: DC
  Administered 2016-01-21 – 2016-01-23 (×3): 10 mg via ORAL
  Filled 2016-01-21 (×3): qty 1

## 2016-01-21 MED ORDER — ALBUTEROL SULFATE (2.5 MG/3ML) 0.083% IN NEBU
2.5000 mg | INHALATION_SOLUTION | Freq: Two times a day (BID) | RESPIRATORY_TRACT | Status: DC
Start: 2016-01-21 — End: 2016-01-21

## 2016-01-21 MED ORDER — METOPROLOL TARTRATE 25 MG PO TABS
25.0000 mg | ORAL_TABLET | Freq: Two times a day (BID) | ORAL | Status: DC
  Administered 2016-01-21 – 2016-01-24 (×6): 25 mg via ORAL
  Filled 2016-01-21 (×5): qty 1

## 2016-01-21 MED ORDER — FUROSEMIDE 20 MG PO TABS
20.0000 mg | ORAL_TABLET | Freq: Every day | ORAL | Status: DC
  Administered 2016-01-21 – 2016-01-24 (×4): 20 mg via ORAL
  Filled 2016-01-21 (×4): qty 1

## 2016-01-21 MED ORDER — LEVALBUTEROL HCL 0.63 MG/3ML IN NEBU
0.6300 mg | INHALATION_SOLUTION | RESPIRATORY_TRACT | Status: DC
Start: 2016-01-21 — End: 2016-01-22
  Administered 2016-01-21 – 2016-01-22 (×9): 0.63 mg via RESPIRATORY_TRACT
  Filled 2016-01-21 (×9): qty 3

## 2016-01-21 MED ORDER — SODIUM CHLORIDE 0.45 % IV SOLN
INTRAVENOUS | Status: DC
  Administered 2016-01-21: 11:00:00 via INTRAVENOUS

## 2016-01-21 NOTE — Consult Note (Signed)
Subjective:    Patient ID: Katherine Larson, female    DOB: [birth year], PCP Katherine Schwalbe, MD.   Past Medical History  Diagnosis Date  . NSVT (nonsustained ventricular tachycardia) ([place])   . DM2 (diabetes mellitus, type 2) ([place])   . CAD (coronary artery disease)     s/p CABG 1996;  Myoview 3/11: mod partially reversible AS perfusion defect, most likely related to variable breast attenuation although an element of scar with peri-infarct ischemia also possible, EF 54% - felt to be low risk  . HTN (hypertension)   . HLD (hyperlipidemia)   . Asthma   . Neuropathy ([place])   . Complication of anesthesia   . PONV (postoperative nausea and vomiting)   . Shortness of breath     with activity  . Seasonal allergies   . GERD (gastroesophageal reflux disease)   . Constipation   . Diarrhea   . Rheumatoid arthritis (HCC)     RA  . Gout   . Anemia     anemia of chronic disease +/- IDA followed by Dr. Earlie Larson. received iron infusions in the past  . Borderline glaucoma   . Hyperthyroidism 01/21/2016   Past Surgical History  Procedure Laterality Date  . Cholecystectomy    . Ptca    . Coronary artery bypass graft  1996    2 vessels  . Hernia repair      hiatal hernia  . Eye surgery Bilateral   . Abdominal hysterectomy    . Fracture surgery Right     arm  . Maximum access (mas)posterior lumbar interbody fusion (plif) 1 level N/A 08/14/2013    Procedure:  MAXIMUM ACCESS SURGERY(MAS) POSTERIOR LUMBAR INTERBODY FUSION LUMBAR THREE-FOUR ;  Surgeon: Katherine Moore, MD;  Location: [facility];  Service: Neurosurgery;  Laterality: N/A;   MAXIMUM ACCESS SURGERY(MAS) POSTERIOR LUMBAR INTERBODY FUSION LUMBAR THREE-FOUR   . Appendectomy    . Colonoscopy  11/2011    Katherine Larson:ext/int hemorrhoids, diverticulosis sigmoid colon and distal desc colon, mid-desc colon hyperplastic polyps.   . Esophagogastroduodenoscopy  11/2011    Katherine Larson: small hiatal hernia, one non-bleeding superficial gastric ulcer,  medium-sized diverticulum in area of papilla  . Givens capsule study  11/2012    Katherine Larson: minimal gastritis, normal small bowel capsule  . Esophagogastroduodenoscopy N/A 12/29/2015    Procedure: ESOPHAGOGASTRODUODENOSCOPY (EGD);  Surgeon: Katherine Dolin, MD;  Location[facility];  Service: Endoscopy;  Laterality: N/A;  0930 - moved to 2/8 @ 1:45 - office to notify pt   Social History   Social History  . Marital Status: Married    Spouse Name: N/A  . Number of Children: 3  . Years of Education: N/A   Social History Main Topics  . Smoking status: Former Smoker -- 15 years    Quit date: 11/20/1994  . Smokeless tobacco: None  . Alcohol Use: No  . Drug Use: No  . Sexual Activity: Not Asked   Other Topics Concern  . None   Social History Narrative   2 living children, one deceased age 65      @ENCMED @ ALLERGIES:  VACCINATION STATUS:  There is no immunization history on file for this patient.  HPI Katherine Larson is a 70 y.o. female with hx of  ventricular tachycardia, type 2 diabetes, coronary artery disease s/p CABG, hypertension, hyperlipidemia, asthma. She presented to the ER 2 nights ago as of general malaise, myalgia, coughs, fever and chills associated with shortness of  breath. -She was subsequently diagnosed with community-acquired pneumonia and admitted. Subsequent workup in the hospital showed that she did have suppressed TSH and elevated free T4. Endocrine consult was initiated. Further interview with the patient she reports she has been losing weight about 24 pounds since December, palpitations, tremors, heat intolerance with hot flashes. She does have family history of thyroid problem in her mother. She was never diagnosed with thyroid dysfunction. -As part of her inpatient workup she underwent CT scan of abdomen with contrast medium. The patient denies personal history of goiter. Patient is not on any anti-thyroid medications nor on any thyroid hormone  supplements.  Review of Systems  Constitutional: + weight loss, + fatigue, + subjective hyperthermia Eyes: no blurry vision, no xerophthalmia ENT: no sore throat, no nodules palpated in throat, no dysphagia/odynophagia, no hoarseness Cardiovascular: no CP, +SOB, +alpitations Respiratory: +cough, +SOB Gastrointestinal: she has had vaguely localized abdominal pain,  no N/V/D/C Musculoskeletal: no muscle/joint aches Skin: no rashes Neurological: + tremors Psychiatric: no depression/anxiety  Objective:    BP 150/79 mmHg  Pulse 108  Temp(Src) 97.2 F (36.2 C) (Oral)  Resp 14  Ht 5' 4.5" (1.638 m)  Wt 143 lb 4.8 oz (65 kg)  BMI 24.23 kg/m2  SpO2 98%  Wt Readings from Last 3 Encounters:  01/21/16 143 lb 4.8 oz (65 kg)  01/08/16 140 lb (63.504 kg)  12/29/15 145 lb (65.772 kg)    Physical Exam Constitutional: overweight, in NAD Eyes: PERRLA, EOMI, no exophthalmos ENT: moist mucous membranes, no thyromegaly, no cervical lymphadenopathy Cardiovascular: RRR, No MRG Respiratory: CTA B Gastrointestinal: abdomen soft, NT, ND, BS+ Musculoskeletal: no deformities, strength intact in all 4 Skin: moist, warm, no rashes Neurological:  + tremor with outstretched hands, DTR are brisk  in all  Extremities.  Results for orders placed or performed during the hospital encounter of 01/18/16  Blood Culture (routine x 2)  Result Value Ref Range   Specimen Description BLOOD LEFT ANTECUBITAL    Special Requests BOTTLES DRAWN AEROBIC AND ANAEROBIC 8CC EACH    Culture NO GROWTH 3 DAYS    Report Status PENDING   Blood Culture (routine x 2)  Result Value Ref Range   Specimen Description BLOOD RIGHT FOREARM    Special Requests BOTTLES DRAWN AEROBIC AND ANAEROBIC 6CC EACH    Culture NO GROWTH 3 DAYS    Report Status PENDING   Urine culture  Result Value Ref Range   Specimen Description URINE, CLEAN CATCH    Special Requests NONE    Culture      MULTIPLE SPECIES PRESENT, SUGGEST  RECOLLECTION Performed at [facility]    Report Status 01/20/2016 FINAL   MRSA PCR Screening  Result Value Ref Range   MRSA by PCR NEGATIVE NEGATIVE  Lipase, blood  Result Value Ref Range   Lipase 18 11 - 51 U/L  Comprehensive metabolic panel  Result Value Ref Range   Sodium 130 (L) 135 - 145 mmol/L   Potassium 3.3 (L) 3.5 - 5.1 mmol/L   Chloride 96 (L) 101 - 111 mmol/L   CO2 22 22 - 32 mmol/L   Glucose, Bld 187 (H) 65 - 99 mg/dL   BUN 10 6 - 20 mg/dL   Creatinine, Ser 0.54 0.44 - 1.00 mg/dL   Calcium 8.0 (L) 8.9 - 10.3 mg/dL   Total Protein 6.7 6.5 - 8.1 g/dL   Albumin 2.9 (L) 3.5 - 5.0 g/dL   AST 24 15 - 41 U/L   ALT 23  14 - 54 U/L   Alkaline Phosphatase 99 38 - 126 U/L   Total Bilirubin 0.6 0.3 - 1.2 mg/dL   GFR calc non Af Amer >60 >60 mL/min   GFR calc Af Amer >60 >60 mL/min   Anion gap 12 5 - 15  CBC  Result Value Ref Range   WBC 10.8 (H) 4.0 - 10.5 K/uL   RBC 3.67 (L) 3.87 - 5.11 MIL/uL   Hemoglobin 11.4 (L) 12.0 - 15.0 g/dL   HCT 32.6 (L) 36.0 - 46.0 %   MCV 88.8 78.0 - 100.0 fL   MCH 31.1 26.0 - 34.0 pg   MCHC 35.0 30.0 - 36.0 g/dL   RDW 14.2 11.5 - 15.5 %   Platelets 191 150 - 400 K/uL  Urinalysis, Routine w reflex microscopic (not at [facility])  Result Value Ref Range   Color, Urine YELLOW YELLOW   APPearance CLEAR CLEAR   Specific Gravity, Urine 1.025 1.005 - 1.030   pH 6.0 5.0 - 8.0   Glucose, UA NEGATIVE NEGATIVE mg/dL   Hgb urine dipstick NEGATIVE NEGATIVE   Bilirubin Urine NEGATIVE NEGATIVE   Ketones, ur 15 (A) NEGATIVE mg/dL   Protein, ur 30 (A) NEGATIVE mg/dL   Nitrite NEGATIVE NEGATIVE   Leukocytes, UA LARGE (A) NEGATIVE  Blood gas, arterial ([facility] ONLY)  Result Value Ref Range   FIO2 0.28    Delivery systems NASAL CANNULA    pH, Arterial 7.502 (H) 7.350 - 7.450   pCO2 arterial 26.8 (L) 35.0 - 45.0 mmHg   pO2, Arterial 81.2 80.0 - 100.0 mmHg   Bicarbonate 23.5 20.0 - 24.0 mEq/L   Acid-base deficit 1.9 0.0 - 2.0 mmol/L   O2  Saturation 95.0 %   Patient temperature 37.0    Collection site RIGHT RADIAL    Drawn by [id number]    Sample type ARTERIAL DRAW    Allens test (pass/fail) PASS PASS  Urine microscopic-add on  Result Value Ref Range   Squamous Epithelial / LPF TOO NUMEROUS TO COUNT (A) NONE SEEN   WBC, UA TOO NUMEROUS TO COUNT 0 - 5 WBC/hpf   RBC / HPF 0-5 0 - 5 RBC/hpf   Bacteria, UA MANY (A) NONE SEEN  Glucose, capillary  Result Value Ref Range   Glucose-Capillary 87 65 - 99 mg/dL  TSH  Result Value Ref Range   TSH 0.092 (L) 0.350 - 4.500 uIU/mL  Ferritin  Result Value Ref Range   Ferritin 1003 (H) 11 - 307 ng/mL  Iron and TIBC  Result Value Ref Range   Iron 20 (L) 28 - 170 ug/dL   TIBC 225 (L) 250 - 450 ug/dL   Saturation Ratios 9 (L) 10.4 - 31.8 %   UIBC 205 ug/dL  Vitamin B12  Result Value Ref Range   Vitamin B-12 353 180 - 914 pg/mL  Hemoglobin A1c  Result Value Ref Range   Hgb A1c MFr Bld 5.8 (H) 4.8 - 5.6 %   Mean Plasma Glucose 120 mg/dL  Brain natriuretic peptide  Result Value Ref Range   B Natriuretic Peptide 931.0 (H) 0.0 - 100.0 pg/mL  Basic metabolic panel  Result Value Ref Range   Sodium 139 135 - 145 mmol/L   Potassium 3.6 3.5 - 5.1 mmol/L   Chloride 106 101 - 111 mmol/L   CO2 23 22 - 32 mmol/L   Glucose, Bld 126 (H) 65 - 99 mg/dL   BUN 6 6 - 20 mg/dL   Creatinine, Ser  0.39 (L) 0.44 - 1.00 mg/dL   Calcium 8.0 (L) 8.9 - 10.3 mg/dL   GFR calc non Af Amer >60 >60 mL/min   GFR calc Af Amer >60 >60 mL/min   Anion gap 10 5 - 15  CBC  Result Value Ref Range   WBC 11.1 (H) 4.0 - 10.5 K/uL   RBC 3.54 (L) 3.87 - 5.11 MIL/uL   Hemoglobin 10.9 (L) 12.0 - 15.0 g/dL   HCT 32.3 (L) 36.0 - 46.0 %   MCV 91.2 78.0 - 100.0 fL   MCH 30.8 26.0 - 34.0 pg   MCHC 33.7 30.0 - 36.0 g/dL   RDW 14.4 11.5 - 15.5 %   Platelets 273 150 - 400 K/uL  Glucose, capillary  Result Value Ref Range   Glucose-Capillary 120 (H) 65 - 99 mg/dL  Glucose, capillary  Result Value Ref Range    Glucose-Capillary 111 (H) 65 - 99 mg/dL   Comment 1 Notify RN    Comment 2 Document in Chart   Glucose, capillary  Result Value Ref Range   Glucose-Capillary 120 (H) 65 - 99 mg/dL  Glucose, capillary  Result Value Ref Range   Glucose-Capillary 106 (H) 65 - 99 mg/dL   Comment 1 Notify RN    Comment 2 Document in Chart   T4, free  Result Value Ref Range   Free T4 2.08 (H) 0.61 - 1.12 ng/dL  T3, free  Result Value Ref Range   T3, Free 2.5 2.0 - 4.4 pg/mL  Glucose, capillary  Result Value Ref Range   Glucose-Capillary 121 (H) 65 - 99 mg/dL   Comment 1 Notify RN    Comment 2 Document in Chart   Glucose, capillary  Result Value Ref Range   Glucose-Capillary 156 (H) 65 - 99 mg/dL   Comment 1 Notify RN    Comment 2 Document in Chart   Basic metabolic panel  Result Value Ref Range   Sodium 139 135 - 145 mmol/L   Potassium 4.5 3.5 - 5.1 mmol/L   Chloride 106 101 - 111 mmol/L   CO2 24 22 - 32 mmol/L   Glucose, Bld 152 (H) 65 - 99 mg/dL   BUN 8 6 - 20 mg/dL   Creatinine, Ser 0.40 (L) 0.44 - 1.00 mg/dL   Calcium 8.1 (L) 8.9 - 10.3 mg/dL   GFR calc non Af Amer >60 >60 mL/min   GFR calc Af Amer >60 >60 mL/min   Anion gap 9 5 - 15  CBC  Result Value Ref Range   WBC 5.5 4.0 - 10.5 K/uL   RBC 3.49 (L) 3.87 - 5.11 MIL/uL   Hemoglobin 10.8 (L) 12.0 - 15.0 g/dL   HCT 32.1 (L) 36.0 - 46.0 %   MCV 92.0 78.0 - 100.0 fL   MCH 30.9 26.0 - 34.0 pg   MCHC 33.6 30.0 - 36.0 g/dL   RDW 14.6 11.5 - 15.5 %   Platelets 306 150 - 400 K/uL  Glucose, capillary  Result Value Ref Range   Glucose-Capillary 172 (H) 65 - 99 mg/dL  Glucose, capillary  Result Value Ref Range   Glucose-Capillary 158 (H) 65 - 99 mg/dL   Comment 1 Notify RN   Glucose, capillary  Result Value Ref Range   Glucose-Capillary 185 (H) 65 - 99 mg/dL   Comment 1 Notify RN   I-Stat CG4 Lactic Acid, ED  (not at  [facility])  Result Value Ref Range   Lactic Acid, Venous 1.26 0.5 - 2.0  mmol/L   Complete Blood Count (Most  recent): Lab Results  Component Value Date   WBC 5.5 01/21/2016   HGB 10.8* 01/21/2016   HCT 32.1* 01/21/2016   MCV 92.0 01/21/2016   PLT 306 01/21/2016   Chemistry (most recent): Lab Results  Component Value Date   NA 139 01/21/2016   K 4.5 01/21/2016   CL 106 01/21/2016   CO2 24 01/21/2016   BUN 8 01/21/2016   CREATININE 0.40* 01/21/2016   Diabetic Labs (most recent): Lab Results  Component Value Date   HGBA1C 5.8* 01/19/2016      Assessment & Plan:   Hyperthyroidism:  Patient's history and most recent labs are reviewed. 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 It will be done tomorrow a.m. -The potential risks of untreated thyrotoxicosis and the need for definitive therapy have been discussed in detail with the patient. Given her history of ventricular tachycardia and coronary artery disease, she would need definitive antithyroid therapy. -Ideally the diagnosis has to be confirmed with thyroid uptake and scan,  the problem with this patient is the fact that she had exposure to IV contrast during this hospitalization which would make it difficult to obtain thyroid uptake and scan for another 8 week. She cannot wait 8 weeks without intervention. -If  repeat labs tomorrow still show hyperthyroidism, she will be initiated on Tapazole ( which she can be discharged with with a follow-up appointment with me in 1 week after discharge) until we are able to obtain thyroid uptake and scan and deliver I 131 ablative therapy down the road.    I will follow-up her thyroid function tests in the morning.  Glade Lloyd, MD Phone: [phone]  Fax: [phone]   01/21/2016, 1:40 PM

## 2016-01-21 NOTE — Progress Notes (Signed)
Pt a/o.vss. IV patent. No complaints of any distress. Consult called to Dr. Dorris Fetch. Pt transferred to room 305. Report called to [place], RN.

## 2016-01-21 NOTE — Progress Notes (Addendum)
[facility] PROGRESS NOTE  Katherine Larson [id number] DOB: [birth year] DOA: 01/18/2016 PCP: [name], MD    Code Status: Full code Family Communication: Family not available Disposition Plan: Discharge when clinically appropriate, possibly in 2-3 days.   Consultants:  None  Procedures:  None  Antibiotics:  IV Levaquin 01/19/16>>  HPI/Subjective: Patient says that she is feeling better since the steroids were started. The morphine is helping with her chronic low back pain.  Objective: Filed Vitals:   01/21/16 0800 01/21/16 0818  BP: 129/73   Pulse: 118   Temp:  97.8 F (36.6 C)  Resp: 29   02 sat 96% on [place] 02  Intake/Output Summary (Last 24 hours) at 01/21/16 1001 Last data filed at 01/21/16 0600  Gross per 24 hour  Intake   1599 ml  Output   1150 ml  Net    449 ml   Filed Weights   01/19/16 0430 01/20/16 0500 01/21/16 0500  Weight: 63.5 kg (139 lb 15.9 oz) 64.3 kg (141 lb 12.1 oz) 65 kg (143 lb 4.8 oz)    Exam:   General:  Ill-appearing [name] in no acute distress.  Cardiovascular: S1, S2, with tachycardia.  Respiratory: Bilateral rhonchi and occasional upper airway wheezes. Breathing nonlabored at rest.  Abdomen: Positive bowel sounds, soft, nontender, nondistended.  Musculoskeletal/extremities: Mild tenderness of her lower back with no acute hot red joints; no pedal edema.  Neurologic: She is alert and oriented 3. Cranial nerves II through XII grossly intact.   Data Reviewed: Basic Metabolic Panel:  Recent Labs Lab 01/18/16 2140 01/20/16 0455 01/21/16 0429  NA 130* 139 139  K 3.3* 3.6 4.5  CL 96* 106 106  CO2 22 23 24   GLUCOSE 187* 126* 152*  BUN 10 6 8   CREATININE 0.54 0.39* 0.40*  CALCIUM 8.0* 8.0* 8.1*   Liver Function Tests:  Recent Labs Lab 01/18/16 2140  AST 24  ALT 23  ALKPHOS 99  BILITOT 0.6  PROT 6.7  ALBUMIN 2.9*    Recent Labs Lab 01/18/16 2140  LIPASE 18   No results  for input(s): AMMONIA in the last 168 hours. CBC:  Recent Labs Lab 01/18/16 2140 01/20/16 0455 01/21/16 0429  WBC 10.8* 11.1* 5.5  HGB 11.4* 10.9* 10.8*  HCT 32.6* 32.3* 32.1*  MCV 88.8 91.2 92.0  PLT 191 273 306   Cardiac Enzymes: No results for input(s): CKTOTAL, CKMB, CKMBINDEX, TROPONINI in the last 168 hours. BNP (last 3 results)  Recent Labs  01/19/16 1040  BNP 931.0*    ProBNP (last 3 results) No results for input(s): PROBNP in the last 8760 hours.  CBG:  Recent Labs Lab 01/20/16 0729 01/20/16 1137 01/20/16 1607 01/20/16 2119 01/21/16 0725  GLUCAP 106* 121* 156* 172* 158*    Recent Results (from the past 240 hour(s))  Blood Culture (routine x 2)     Status: None (Preliminary result)   Collection Time: 01/18/16 10:58 PM  Result Value Ref Range Status   Specimen Description BLOOD LEFT ANTECUBITAL  Final   Special Requests BOTTLES DRAWN AEROBIC AND ANAEROBIC [place]  Final   Culture NO GROWTH 3 DAYS  Final   Report Status PENDING  Incomplete  Blood Culture (routine x 2)     Status: None (Preliminary result)   Collection Time: 01/18/16 11:15 PM  Result Value Ref Range Status   Specimen Description BLOOD RIGHT FOREARM  Final   Special Requests BOTTLES DRAWN AEROBIC AND ANAEROBIC [place]  Final  Culture NO GROWTH 3 DAYS  Final   Report Status PENDING  Incomplete  Urine culture     Status: None   Collection Time: 01/19/16  1:30 AM  Result Value Ref Range Status   Specimen Description URINE, CLEAN CATCH  Final   Special Requests NONE  Final   Culture   Final    MULTIPLE SPECIES PRESENT, SUGGEST RECOLLECTION Performed at [facility]    Report Status 01/20/2016 FINAL  Final  MRSA PCR Screening     Status: None   Collection Time: 01/19/16  4:37 AM  Result Value Ref Range Status   MRSA by PCR NEGATIVE NEGATIVE Final    Comment:        The GeneXpert MRSA Assay (FDA approved for NASAL specimens only), is one component of a comprehensive MRSA  colonization surveillance program. It is not intended to diagnose MRSA infection nor to guide or monitor treatment for MRSA infections.      Studies: No results found.  Scheduled Meds: . albuterol  2.5 mg Nebulization BID  . allopurinol  300 mg Oral Daily  . atorvastatin  80 mg Oral Daily  . docusate sodium  100 mg Oral Q12H  . DULoxetine  60 mg Oral Daily  . enoxaparin (LOVENOX) injection  40 mg Subcutaneous Q24H  . fluticasone  2 spray Each Nare Daily  . insulin aspart  0-15 Units Subcutaneous TID WC  . insulin aspart  0-5 Units Subcutaneous QHS  . levofloxacin (LEVAQUIN) IV  750 mg Intravenous Q24H  . losartan  50 mg Oral Daily  . methylPREDNISolone (SOLU-MEDROL) injection  40 mg Intravenous 3 times per day  . metoCLOPramide  10 mg Oral QHS  . metoprolol tartrate  50 mg Oral BID  . mometasone-formoterol  2 puff Inhalation BID  . omega-3 acid ethyl esters  1 g Oral Daily  . pantoprazole  40 mg Oral Daily  . polyethylene glycol  17 g Oral QHS  . potassium chloride  40 mEq Oral Daily   Continuous Infusions: . 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 Community acquired pneumonia. On admission, the patient's chest x-ray revealed bibasilar airspace densities compatible with pneumonia. She was borderline febrile in the ED. Her white blood cell count was marginally elevated. She had recently completed a course of Tamiflu for influenza. She was started on IV Levaquin. Oxygen was started and titrated to keep her oxygen saturations greater than 90%. She is still symptomatic and has more rhonchi on exam which is indicative of  COPD or bronchitis or opening up of her airways or from the increase in dose of metoprolol.   COPD with exacerbation. Patient had a few wheezes on exam, which could be secondary to pneumonia. She developed some rhonchi. However, she uses Dulera and albuterol inhaler chronically. She also takes Singulair. She denies COPD, but may have chronic asthmatic bronchitis. Dulera inhaler was started on admission. IV Solu-Medrol was started and will be titrated up to 60 mg every 8 hours. Will restart Singulair. -We'll start Xopenex nebulizer cautiously in the setting of sinus tachycardia. -We'll decrease dose of metoprolol due  to increase in her bronchospasms.  Sinus tachycardia. Patient was noted to have tachycardia in the ED and it has persisted. EKG today revealed sinus tachycardia with a heart rate of 110 bpm. She was started on gentle IV fluids for possible mild volume depletion. The dose of metoprolol was decreased on admission so the tachycardia could've been from the rebound effects. She also complained of low back pain which may increase her heart rate. -Metoprolol was increased to 50 mg twice a day which may be contributing to her bronchospasms. We'll decrease the dose to 25 mg twice a day and start small dosing of Cardizem. -TSH was ordered and was low at 0.92. Free T4 was elevated at 2.08 and free T3 within normal limits; query new diagnosis of hyperthyroidism which could be contributing to her tachycardia.  Newly diagnosed hyperthyroidism. Patient's TSH was low at 0.92 and her free T4 was elevated at 2.08. Free T3 was within normal limits. [facility] consult endocrinology.  Hypertension. The patient is treated with metoprolol and Cozaar chronically. Her blood pressures have been low normal, but are improving. -Metoprolol was increased to 50 mg twice a day in an attempt to decrease her heart rate. Due to the increase in her rhonchus wheezing, will decrease metoprolol to 25 mg twice a day and start  small dosing of diltiazem. Cozaar was decreased from 100 to 50 mg daily.  Diet-controlled diabetes mellitus. Sliding scale NovoLog was started for an elevated blood glucose. Her hemoglobin A1c was 5.8. -We'll adjust insulin with the start of IV Solu-Medrol.  Hyponatremia. Patient serum sodium was 130 on admission. She was started on normal saline infusion. Her serum sodium has improved. Will continue gentle IV fluids. Hypokalemia. Patient's serum potassium was 3.3 on admission. She was started on potassium chloride. Her serum potassium has improved.  Normocytic anemia. Patient's hemoglobin ranges from 10.9-11. Anemia panel reveals a total iron of 20, TIBC of 225, ferritin of 1003, and vitamin B12 of 353. -We'll restart iron supplementation at the time of discharge.  Rheumatoid arthritis. The patient has chronic pain from RA. She is treated with Plaquenil and Arava. Both are being held due to her infection. We'll treat her pain with as needed opiates.      Time spent: 35 minutes     [place] [facility] Pager [phone]. If 7PM-7AM, please contact night-coverage at [email or web address], password [facility] 01/21/2016, 10:01 AM  LOS: 2 days

## 2016-01-22 LAB — GLUCOSE, CAPILLARY
Glucose-Capillary: 166 mg/dL — ABNORMAL HIGH (ref 65–99)
Glucose-Capillary: 188 mg/dL — ABNORMAL HIGH (ref 65–99)
Glucose-Capillary: 201 mg/dL — ABNORMAL HIGH (ref 65–99)
Glucose-Capillary: 187 mg/dL — ABNORMAL HIGH (ref 65–99)

## 2016-01-22 LAB — T4, FREE: Free T4: 1.77 ng/dL — ABNORMAL HIGH (ref 0.61–1.12)

## 2016-01-22 LAB — TSH: TSH: 0.129 u[IU]/mL — ABNORMAL LOW (ref 0.350–4.500)

## 2016-01-22 MED ORDER — ALBUTEROL SULFATE (2.5 MG/3ML) 0.083% IN NEBU
2.5000 mg | INHALATION_SOLUTION | RESPIRATORY_TRACT | Status: DC | PRN
  Administered 2016-01-23: 2.5 mg via RESPIRATORY_TRACT

## 2016-01-22 MED ORDER — METHIMAZOLE 5 MG PO TABS
10.0000 mg | ORAL_TABLET | Freq: Every day | ORAL | Status: DC
  Administered 2016-01-22 – 2016-01-24 (×3): 10 mg via ORAL
  Filled 2016-01-22: qty 1
  Filled 2016-01-22: qty 2
  Filled 2016-01-22: qty 1
  Filled 2016-01-22: qty 2
  Filled 2016-01-22: qty 1

## 2016-01-22 MED ORDER — LEVALBUTEROL HCL 0.63 MG/3ML IN NEBU
0.6300 mg | INHALATION_SOLUTION | RESPIRATORY_TRACT | Status: DC
  Administered 2016-01-23 – 2016-01-24 (×4): 0.63 mg via RESPIRATORY_TRACT
  Filled 2016-01-22 (×5): qty 3

## 2016-01-22 NOTE — Progress Notes (Addendum)
[facility] PROGRESS NOTE  Katherine Larson [id number] DOB: [birth year] DOA: 01/18/2016 PCP: [name], MD    Code Status: Full code Family Communication: Family not available Disposition Plan: Discharge when clinically appropriate, possibly in 2-3 days.   Consultants:  Endocrinology   Procedures:  None  Antibiotics:  IV Levaquin 01/19/16>>  HPI/Subjective: Patient has no complaints of chest pain or shortness of breath or palpitations at rest.   Objective: Filed Vitals:   01/21/16 2134 01/22/16 0617  BP: 151/75 140/74  Pulse: 109 102  Temp: 98.3 F (36.8 C) 98.3 F (36.8 C)  Resp: 20 17  02 sat 98% on [place] 02  Intake/Output Summary (Last 24 hours) at 01/22/16 1156 Last data filed at 01/22/16 0900  Gross per 24 hour  Intake 1406.75 ml  Output      0 ml  Net 1406.75 ml   Filed Weights   01/20/16 0500 01/21/16 0500 01/21/16 1752  Weight: 64.3 kg (141 lb 12.1 oz) 65 kg (143 lb 4.8 oz) 67.4 kg (148 lb 9.4 oz)    Exam:   General:  Ill-appearing [name] in no acute distress.  Cardiovascular: S1, S2, with borderline tachycardia.  Respiratory: Few scattered rhonchus wheezes, decreased from yesterday.  Breathing nonlabored at rest.  Abdomen: Positive bowel sounds, soft, nontender, nondistended.  Musculoskeletal/extremities: Mild tenderness of her lower back with no acute hot red joints; no pedal edema.  Neurologic: She is alert and oriented 3. Cranial nerves II through XII grossly intact.   Data Reviewed: Basic Metabolic Panel:  Recent Labs Lab 01/18/16 2140 01/20/16 0455 01/21/16 0429  NA 130* 139 139  K 3.3* 3.6 4.5  CL 96* 106 106  CO2 22 23 24   GLUCOSE 187* 126* 152*  BUN 10 6 8   CREATININE 0.54 0.39* 0.40*  CALCIUM 8.0* 8.0* 8.1*   Liver Function Tests:  Recent Labs Lab 01/18/16 2140  AST 24  ALT 23  ALKPHOS 99  BILITOT 0.6  PROT 6.7  ALBUMIN 2.9*    Recent Labs Lab 01/18/16 2140  LIPASE 18    No results for input(s): AMMONIA in the last 168 hours. CBC:  Recent Labs Lab 01/18/16 2140 01/20/16 0455 01/21/16 0429  WBC 10.8* 11.1* 5.5  HGB 11.4* 10.9* 10.8*  HCT 32.6* 32.3* 32.1*  MCV 88.8 91.2 92.0  PLT 191 273 306   Cardiac Enzymes: No results for input(s): CKTOTAL, CKMB, CKMBINDEX, TROPONINI in the last 168 hours. BNP (last 3 results)  Recent Labs  01/19/16 1040  BNP 931.0*    ProBNP (last 3 results) No results for input(s): PROBNP in the last 8760 hours.  CBG:  Recent Labs Lab 01/21/16 1114 01/21/16 1623 01/21/16 1955 01/22/16 0738 01/22/16 1102  GLUCAP 185* 166* 207* 166* 188*    Recent Results (from the past 240 hour(s))  Blood Culture (routine x 2)     Status: None (Preliminary result)   Collection Time: 01/18/16 10:58 PM  Result Value Ref Range Status   Specimen Description BLOOD LEFT ANTECUBITAL  Final   Special Requests BOTTLES DRAWN AEROBIC AND ANAEROBIC 8CC EACH  Final   Culture NO GROWTH 4 DAYS  Final   Report Status PENDING  Incomplete  Blood Culture (routine x 2)     Status: None (Preliminary result)   Collection Time: 01/18/16 11:15 PM  Result Value Ref Range Status   Specimen Description BLOOD RIGHT FOREARM  Final   Special Requests BOTTLES DRAWN AEROBIC AND ANAEROBIC [place]  Final  Culture NO GROWTH 4 DAYS  Final   Report Status PENDING  Incomplete  Urine culture     Status: None   Collection Time: 01/19/16  1:30 AM  Result Value Ref Range Status   Specimen Description URINE, CLEAN CATCH  Final   Special Requests NONE  Final   Culture   Final    MULTIPLE SPECIES PRESENT, SUGGEST RECOLLECTION Performed at [facility]    Report Status 01/20/2016 FINAL  Final  MRSA PCR Screening     Status: None   Collection Time: 01/19/16  4:37 AM  Result Value Ref Range Status   MRSA by PCR NEGATIVE NEGATIVE Final    Comment:        The GeneXpert MRSA Assay (FDA approved for NASAL specimens only), is one component of  a comprehensive MRSA colonization surveillance program. It is not intended to diagnose MRSA infection nor to guide or monitor treatment for MRSA infections.      Studies: Dg Chest 2 View  01/21/2016  CLINICAL DATA:  Shortness of breath, pneumonia EXAM: CHEST  2 VIEW COMPARISON:  01/18/2016 FINDINGS: Mild patchy bilateral lower lobe opacities, suspicious for pneumonia, unchanged. Possible small left pleural effusion. No pneumothorax. The heart is normal in size. Postsurgical changes related to prior CABG. Visualized osseous structures are within normal limits. IMPRESSION: Mild patchy bilateral lower lobe opacities, suspicious for pneumonia, unchanged. Possible small left pleural effusion. Electronically Signed   By: Julian Hy M.D.   On: 01/21/2016 14:40   US Thyroid  01/21/2016  CLINICAL DATA:  Hyperthyroidism. Tachycardia, diabetes, hypertension, anemia. EXAM: THYROID ULTRASOUND TECHNIQUE: Ultrasound examination of the thyroid gland and adjacent soft tissues was performed. COMPARISON:  None. FINDINGS: Right thyroid lobe Measurements: 54 x 23 x 19 mm. Mildly inhomogeneous background parenchyma. 4 x 3 x 3 mm hypoechoic nodule or cyst, inferior pole. Left thyroid lobe Measurements: 49 x 19 x 19 mm. 4 x 3 x 3 mm hypoechoic nodule or cyst, mid lobe. Isthmus Thickness: 9 mm.  6 x 3 x 6 mm hypoechoic nodule, right of midline. Lymphadenopathy None visualized. IMPRESSION: 1. 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 Findings do not meet current consensus criteria for biopsy. Follow-up by clinical exam is recommended. If patient has known risk factors for thyroid carcinoma, consider follow-up ultrasound in 12 months. If patient is clinically hyperthyroid, consider nuclear medicine thyroid uptake and scan. This recommendation follows the consensus statement: Management of Thyroid Nodules Detected as [place]: Society of Radiologists in [place]. Radiology 2005;  [id number]. Electronically Signed   By: Lucrezia Europe M.D.   On: 01/21/2016 13:26    Scheduled Meds: . allopurinol  300 mg Oral Daily  . atorvastatin  80 mg Oral Daily  . diltiazem  30 mg Oral 3 times per day  . docusate sodium  100 mg Oral Q12H  . DULoxetine  60 mg Oral Daily  . enoxaparin (LOVENOX) injection  40 mg Subcutaneous Q24H  . fluticasone  2 spray Each Nare Daily  . furosemide  20 mg Oral Daily  . insulin aspart  0-15 Units Subcutaneous TID WC  . insulin aspart  0-5 Units Subcutaneous QHS  . levalbuterol  0.63 mg Nebulization Q4H  . levofloxacin (LEVAQUIN) IV  750 mg Intravenous Q24H  . losartan  50 mg Oral Daily  . methylPREDNISolone (SOLU-MEDROL) injection  60 mg Intravenous 3 times per day  . metoCLOPramide  10 mg Oral QHS  . metoprolol tartrate  25 mg Oral BID  .  mometasone-formoterol  2 puff Inhalation BID  . montelukast  10 mg Oral QHS  . omega-3 acid ethyl esters  1 g Oral Daily  . pantoprazole  40 mg Oral Daily  . polyethylene glycol  17 g Oral QHS  . tiotropium  18 mcg Inhalation Daily   Continuous Infusions: . sodium chloride 65 mL/hr at 01/21/16 1103   Assessment and plan:  Principal Problem:   CAP (community acquired pneumonia) Active Problems:   COPD exacerbation ([place])   Diet-controlled diabetes mellitus ([place])   Sinus tachycardia ([place])   Essential hypertension   DM type 2 (diabetes mellitus, type 2) ([place])   GERD (gastroesophageal reflux disease)   Hypokalemia   Hyponatremia   Rheumatoid aortitis (HCC)   Hyperthyroidism  1. CAP. On admission, the patient's chest x-ray revealed bibasilar airspace densities compatible with pneumonia. She was borderline febrile in the ED. Her white blood cell count was marginally elevated. She had recently completed a course of Tamiflu for influenza. She was started on IV Levaquin. Oxygen was started and titrated to keep her oxygen saturations greater than 90%. She is still symptomatic and has a few wheezes on exam.    Possible COPD with mild exacerbation. Patient had a few wheezes on exam, which could be secondary to pneumonia. She developed some rhonchi. However, she uses Dulera and albuterol inhaler chronically. She also takes Singulair. She denies COPD, but may have chronic asthmatic bronchitis. Dulera inhaler was started on admission. IV Solu-Medrol was started and will be titrated up to 60 mg every 8 hours. Singulair was restarted. -Xopenex nebulizer added cautiously in the setting of sinus tachycardia. Dose of metoprolol was decreased slightly to decrease beta agonist induced bronchospasms.  Sinus tachycardia; likely secondary to thyrotoxicosis.  Patient was noted to have tachycardia in the ED and it has persisted. EKG today revealed sinus tachycardia with a heart rate of 110 bpm. She was started on gentle IV fluids for possible mild volume depletion. The initial impression was that she was tachycardic from her respiratory illness or from chronic pain or from the initial decreased dose of metoprolol on admission. The  -Metoprolol was decreased back to 25 mg twice a day and diltiazem was started for treatment of hypertension and tachycardia. -TSH was ordered and was low at 0.92. Free T4 was elevated at 2.08 and free T3 within normal limits. -Thyroid ultrasound was ordered and revealed small bilateral nodules or cysts which did not meet the consensus for biopsy; follow-up ultrasound in 12 months was recommended. -Endocrinologist, Dr. Dorris Fetch was consulted. He believed she had thyrotoxicosis. He ordered another set of thyroid function tests and if they are still consistent with hyperthyroidism, he recommended starting Tapazole. He will follow-up with her in the outpatient setting following discharge.   Newly diagnosed hyperthyroidism/thyrotoxicosis.  As above. Follow-up thyroid function test results pending.  Hypertension. The patient is treated with metoprolol and Cozaar chronically. Her blood pressures have  been low normal, but are improving. -Metoprolol was increased to 50 mg twice a day in an attempt to decrease her heart rate. Due to the increase in her rhonchus wheezing, will decrease metoprolol to 25 mg twice a day and started small dosing of diltiazem. Cozaar was decreased from 100 to 50 mg daily.  Diet-controlled diabetes mellitus. Sliding scale NovoLog was started for an elevated blood glucose. Her hemoglobin A1c was 5.8. -We'll adjust insulin with the start of IV Solu-Medrol.  Hyponatremia. Patient serum sodium was 130 on admission. She was started on normal saline infusion.  Her serum sodium has improved. Will continue gentle IV fluids. Hypokalemia. Patient's serum potassium was 3.3 on admission. She was started on potassium chloride. Her serum potassium has improved.  Normocytic anemia. Patient's hemoglobin ranges from 10.9-11. Anemia panel reveals a total iron of 20, TIBC of 225, ferritin of 1003, and vitamin B12 of 353. -We'll restart iron supplementation at the time of discharge.  Rheumatoid arthritis. The patient has chronic pain from RA. She is treated with Plaquenil and Arava. Both are being held due to her infection. We'll treat her pain with as needed opiates.      Time spent: 25 minutes     [place] [facility] Pager [phone]. If 7PM-7AM, please contact night-coverage at [email or web address], password [facility] 01/22/2016, 11:56 AM  LOS: 3 days

## 2016-01-22 NOTE — Progress Notes (Signed)
Patient O2 sats RA 96% Patient RA 96% while ambulating.

## 2016-01-23 ENCOUNTER — Other Ambulatory Visit: Payer: Medicare Other

## 2016-01-23 LAB — GLUCOSE, CAPILLARY
Glucose-Capillary: 165 mg/dL — ABNORMAL HIGH (ref 65–99)
Glucose-Capillary: 196 mg/dL — ABNORMAL HIGH (ref 65–99)
Glucose-Capillary: 214 mg/dL — ABNORMAL HIGH (ref 65–99)
Glucose-Capillary: 201 mg/dL — ABNORMAL HIGH (ref 65–99)

## 2016-01-23 LAB — T3, FREE: T3, Free: 2.7 pg/mL (ref 2.0–4.4)

## 2016-01-23 LAB — THYROID PEROXIDASE ANTIBODY: Thyroperoxidase Ab SerPl-aCnc: <6 IU/mL (ref 0–34)

## 2016-01-23 MED ORDER — INSULIN GLARGINE 100 UNIT/ML ~~LOC~~ SOLN
12.0000 [IU] | Freq: Every day | SUBCUTANEOUS | Status: DC
  Filled 2016-01-23 (×2): qty 0.12

## 2016-01-23 MED ORDER — DILTIAZEM HCL 60 MG PO TABS
60.0000 mg | ORAL_TABLET | Freq: Three times a day (TID) | ORAL | Status: DC
  Administered 2016-01-23 – 2016-01-24 (×3): 60 mg via ORAL
  Filled 2016-01-23 (×3): qty 1

## 2016-01-23 MED ORDER — POLYSACCHARIDE IRON COMPLEX 150 MG PO CAPS
150.0000 mg | ORAL_CAPSULE | Freq: Every day | ORAL | Status: DC
  Administered 2016-01-24: 150 mg via ORAL
  Filled 2016-01-23: qty 1

## 2016-01-23 NOTE — Progress Notes (Addendum)
[facility] PROGRESS NOTE  Katherine Larson [id number] DOB: 01-14-1946 DOA: 01/18/2016 PCP: [name], MD    Code Status: Full code Family Communication: Family not available Disposition Plan: Discharge when clinically appropriate, possibly in the next 24 hours   Consultants:  Endocrinology   Procedures:  None  Antibiotics:  IV Levaquin 01/19/16>>  HPI/Subjective: Patient denies short of breath at rest, but does become a little short of breath with activity.   Objective: Filed Vitals:   01/22/16 2140 01/23/16 0528  BP: 133/65 147/80  Pulse: 107 115  Temp: 98.3 F (36.8 C) 98.3 F (36.8 C)  Resp: 17 19  02 sat 95% on room air.  Intake/Output Summary (Last 24 hours) at 01/23/16 1157 Last data filed at 01/23/16 0941  Gross per 24 hour  Intake    600 ml  Output    900 ml  Net   -300 ml   Filed Weights   01/20/16 0500 01/21/16 0500 01/21/16 1752  Weight: 64.3 kg (141 lb 12.1 oz) 65 kg (143 lb 4.8 oz) 67.4 kg (148 lb 9.4 oz)    Exam:   General:  [name] in no acute distress; she looks better.  Cardiovascular: S1, S2, with borderline tachycardia.  Respiratory: Few scattered rhonchi decreased from yesterday.  Breathing nonlabored at rest.  Abdomen: Positive bowel sounds, soft, nontender, nondistended.  Musculoskeletal/extremities: Mild tenderness of her lower back with no acute hot red joints; no pedal edema.  Neurologic: She is alert and oriented 3. Cranial nerves II through XII grossly intact.   Data Reviewed: Basic Metabolic Panel:  Recent Labs Lab 01/18/16 2140 01/20/16 0455 01/21/16 0429  NA 130* 139 139  K 3.3* 3.6 4.5  CL 96* 106 106  CO2 22 23 24   GLUCOSE 187* 126* 152*  BUN 10 6 8   CREATININE 0.54 0.39* 0.40*  CALCIUM 8.0* 8.0* 8.1*   Liver Function Tests:  Recent Labs Lab 01/18/16 2140  AST 24  ALT 23  ALKPHOS 99  BILITOT 0.6  PROT 6.7  ALBUMIN 2.9*    Recent Labs Lab 01/18/16 2140   LIPASE 18   No results for input(s): AMMONIA in the last 168 hours. CBC:  Recent Labs Lab 01/18/16 2140 01/20/16 0455 01/21/16 0429  WBC 10.8* 11.1* 5.5  HGB 11.4* 10.9* 10.8*  HCT 32.6* 32.3* 32.1*  MCV 88.8 91.2 92.0  PLT 191 273 306   Cardiac Enzymes: No results for input(s): CKTOTAL, CKMB, CKMBINDEX, TROPONINI in the last 168 hours. BNP (last 3 results)  Recent Labs  01/19/16 1040  BNP 931.0*    ProBNP (last 3 results) No results for input(s): PROBNP in the last 8760 hours.  CBG:  Recent Labs Lab 01/22/16 0738 01/22/16 1102 01/22/16 1709 01/22/16 2138 01/23/16 0758  GLUCAP 166* 188* 201* 187* 165*    Recent Results (from the past 240 hour(s))  Blood Culture (routine x 2)     Status: None (Preliminary result)   Collection Time: 01/18/16 10:58 PM  Result Value Ref Range Status   Specimen Description BLOOD LEFT ANTECUBITAL  Final   Special Requests BOTTLES DRAWN AEROBIC AND ANAEROBIC 8CC EACH  Final   Culture NO GROWTH 4 DAYS  Final   Report Status PENDING  Incomplete  Blood Culture (routine x 2)     Status: None (Preliminary result)   Collection Time: 01/18/16 11:15 PM  Result Value Ref Range Status   Specimen Description BLOOD RIGHT FOREARM  Final   Special Requests BOTTLES DRAWN AEROBIC  AND ANAEROBIC 6CC EACH  Final   Culture NO GROWTH 4 DAYS  Final   Report Status PENDING  Incomplete  Urine culture     Status: None   Collection Time: 01/19/16  1:30 AM  Result Value Ref Range Status   Specimen Description URINE, CLEAN CATCH  Final   Special Requests NONE  Final   Culture   Final    MULTIPLE SPECIES PRESENT, SUGGEST RECOLLECTION Performed at [facility]    Report Status 01/20/2016 FINAL  Final  MRSA PCR Screening     Status: None   Collection Time: 01/19/16  4:37 AM  Result Value Ref Range Status   MRSA by PCR NEGATIVE NEGATIVE Final    Comment:        The GeneXpert MRSA Assay (FDA approved for NASAL specimens only), is one  component of a comprehensive MRSA colonization surveillance program. It is not intended to diagnose MRSA infection nor to guide or monitor treatment for MRSA infections.      Studies: Dg Chest 2 View  01/21/2016  CLINICAL DATA:  Shortness of breath, pneumonia EXAM: CHEST  2 VIEW COMPARISON:  01/18/2016 FINDINGS: Mild patchy bilateral lower lobe opacities, suspicious for pneumonia, unchanged. Possible small left pleural effusion. No pneumothorax. The heart is normal in size. Postsurgical changes related to prior CABG. Visualized osseous structures are within normal limits. IMPRESSION: Mild patchy bilateral lower lobe opacities, suspicious for pneumonia, unchanged. Possible small left pleural effusion. Electronically Signed   By: Julian Hy M.D.   On: 01/21/2016 14:40   US Thyroid  01/21/2016  CLINICAL DATA:  Hyperthyroidism. Tachycardia, diabetes, hypertension, anemia. EXAM: THYROID ULTRASOUND TECHNIQUE: Ultrasound examination of the thyroid gland and adjacent soft tissues was performed. COMPARISON:  None. FINDINGS: Right thyroid lobe Measurements: 54 x 23 x 19 mm. Mildly inhomogeneous background parenchyma. 4 x 3 x 3 mm hypoechoic nodule or cyst, inferior pole. Left thyroid lobe Measurements: 49 x 19 x 19 mm. 4 x 3 x 3 mm hypoechoic nodule or cyst, mid lobe. Isthmus Thickness: 9 mm.  6 x 3 x 6 mm hypoechoic nodule, right of midline. Lymphadenopathy None visualized. IMPRESSION: 1. 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 Findings do not meet current consensus criteria for biopsy. Follow-up by clinical exam is recommended. If patient has known risk factors for thyroid carcinoma, consider follow-up ultrasound in 12 months. If patient is clinically hyperthyroid, consider nuclear medicine thyroid uptake and scan. This recommendation follows the consensus statement: Management of Thyroid Nodules Detected as [place]: Society of Radiologists in [place]. Radiology  2005; [id number]. Electronically Signed   By: Lucrezia Europe M.D.   On: 01/21/2016 13:26    Scheduled Meds: . allopurinol  300 mg Oral Daily  . atorvastatin  80 mg Oral Daily  . diltiazem  30 mg Oral 3 times per day  . docusate sodium  100 mg Oral Q12H  . DULoxetine  60 mg Oral Daily  . enoxaparin (LOVENOX) injection  40 mg Subcutaneous Q24H  . fluticasone  2 spray Each Nare Daily  . furosemide  20 mg Oral Daily  . insulin aspart  0-15 Units Subcutaneous TID WC  . levalbuterol  0.63 mg Nebulization Q4H WA  . levofloxacin (LEVAQUIN) IV  750 mg Intravenous Q24H  . losartan  50 mg Oral Daily  . methimazole  10 mg Oral Daily  . methylPREDNISolone (SOLU-MEDROL) injection  60 mg Intravenous 3 times per day  . metoCLOPramide  10 mg Oral QHS  .  metoprolol tartrate  25 mg Oral BID  . mometasone-formoterol  2 puff Inhalation BID  . montelukast  10 mg Oral QHS  . omega-3 acid ethyl esters  1 g Oral Daily  . pantoprazole  40 mg Oral Daily  . polyethylene glycol  17 g Oral QHS  . 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 CAP On admission, the patient's chest x-ray revealed bibasilar airspace densities compatible with pneumonia. She was borderline febrile in the ED. Her white blood cell count was marginally elevated. She had recently completed a course of Tamiflu for influenza. She was started on IV Levaquin. Oxygen was started and titrated to keep her oxygen saturations greater than 90%. She is less symptomatic and her oxygen saturations have improved on room air, but she still has some  rhonchi. -Continue current management.   Possible COPD with mild exacerbation. Patient had a few wheezes on exam, which could be secondary to pneumonia. She developed some rhonchi. However, she uses Dulera and albuterol inhaler chronically. She also takes Singulair. She denies COPD, but may have chronic asthmatic bronchitis. Dulera inhaler was started on admission. IV Solu-Medrol was started and was titrated up to 60 mg every 8 hours. Singulair was restarted. -Xopenex nebulizer was started cautiously in the setting of sinus tachycardia. Dose of metoprolol was decreased slightly to decrease beta agonist induced bronchospasms.  Sinus tachycardia; likely secondary to thyrotoxicosis.  Patient was noted to have tachycardia in the ED and it has persisted. EKG today revealed sinus tachycardia with a heart rate of 110 bpm. She was started on gentle IV fluids for possible mild volume depletion. The initial impression was that she was tachycardic from her respiratory illness or from chronic pain or from the initial decreased dose of metoprolol on admission.   -Metoprolol was decreased back to 25 mg twice a day and diltiazem was started for treatment of hypertension and tachycardia. -TSH was ordered and was low at 0.92. Free T4 was elevated at 2.08 and free T3 within normal limits. -Thyroid ultrasound was ordered and revealed small bilateral nodules or cysts which did not meet the consensus for biopsy; follow-up ultrasound in 12 months was recommended. -Endocrinologist, Dr. Dorris Fetch was consulted. He believed she had thyrotoxicosis. He ordered another set of thyroid function tests and if they are still consistent with hyperthyroidism, he recommended starting Tapazole. He will follow-up with her in the outpatient setting following discharge.  -To follow-up thyroid indices revealed hyperthyroidism. Tapazole was started. -We'll increase diltiazem to 60 mg every 8 hours as she is still slightly tachycardic.  Newly  diagnosed hyperthyroidism/thyrotoxicosis.  As above. Tapazole was started.  Hypertension. The patient is treated with metoprolol and Cozaar chronically. Her blood pressures have been low normal, but are improving. -Metoprolol was increased to 50 mg twice a day in an attempt to decrease her heart rate. Due to the increase in her rhonchus wheezing,  metoprolol was decreased to 25 mg twice a day and started small dosing of diltiazem. Cozaar was decreased from 100 to 50 mg daily.  Diet-controlled diabetes mellitus. Sliding scale NovoLog was started for  an elevated blood glucose. Her hemoglobin A1c was 5.8. -We'll adjust insulin with the start of IV Solu-Medrol.  Hyponatremia. Patient serum sodium was 130 on admission. She was started on normal saline infusion. Her serum sodium has improved. Will continue gentle IV fluids. Hypokalemia. Patient's serum potassium was 3.3 on admission. She was started on potassium chloride. Her serum potassium has improved.  Normocytic anemia. Patient's hemoglobin ranges from 10.9-11. Anemia panel reveals a total iron of 20, TIBC of 225, ferritin of 1003, and vitamin B12 of 353. -We will iron supplementation.  Rheumatoid arthritis. The patient has chronic pain from RA. She is treated with Plaquenil and Arava. Both are being held due to her infection. Continue as needed opiates for pain. We'll restart her chronic medications at the time of discharge.      Time spent: 25 minutes     [facility] Pager [phone]. If 7PM-7AM, please contact night-coverage at [email or web address], password [facility] 01/23/2016, 11:57 AM  LOS: 4 days

## 2016-01-24 DIAGNOSIS — A419 Sepsis, unspecified organism: Secondary | ICD-10-CM | POA: Insufficient documentation

## 2016-01-24 LAB — CBC
HCT: 30.2 % — ABNORMAL LOW (ref 36.0–46.0)
Hemoglobin: 10.1 g/dL — ABNORMAL LOW (ref 12.0–15.0)
MCH: 31.1 pg (ref 26.0–34.0)
MCHC: 33.4 g/dL (ref 30.0–36.0)
MCV: 92.9 fL (ref 78.0–100.0)
Platelets: 364 10*3/uL (ref 150–400)
RBC: 3.25 MIL/uL — ABNORMAL LOW (ref 3.87–5.11)
RDW: 14.3 % (ref 11.5–15.5)
WBC: 10.3 10*3/uL (ref 4.0–10.5)

## 2016-01-24 LAB — CULTURE, BLOOD (ROUTINE X 2)
Culture: NO GROWTH
Culture: NO GROWTH

## 2016-01-24 LAB — GLUCOSE, CAPILLARY: Glucose-Capillary: 180 mg/dL — ABNORMAL HIGH (ref 65–99)

## 2016-01-24 LAB — BASIC METABOLIC PANEL
Anion gap: 9 (ref 5–15)
BUN: 20 mg/dL (ref 6–20)
CO2: 27 mmol/L (ref 22–32)
Calcium: 8.1 mg/dL — ABNORMAL LOW (ref 8.9–10.3)
Chloride: 101 mmol/L (ref 101–111)
Creatinine, Ser: 0.53 mg/dL (ref 0.44–1.00)
GFR calc Af Amer: >60 mL/min (ref >60)
GFR calc non Af Amer: >60 mL/min (ref >60)
Glucose, Bld: 217 mg/dL — ABNORMAL HIGH (ref 65–99)
Potassium: 3.9 mmol/L (ref 3.5–5.1)
Sodium: 137 mmol/L (ref 135–145)

## 2016-01-24 LAB — THYROGLOBULIN ANTIBODY: Thyroglobulin Antibody: <1 IU/mL (ref 0.0–0.9)

## 2016-01-24 MED ORDER — PREDNISONE 10 MG PO TABS: ORAL_TABLET | ORAL | Status: DC

## 2016-01-24 MED ORDER — LEVOFLOXACIN 750 MG PO TABS: 750.0000 mg | ORAL_TABLET | Freq: Every day | ORAL | Status: DC

## 2016-01-24 MED ORDER — METOPROLOL TARTRATE 25 MG PO TABS: 25.0000 mg | ORAL_TABLET | Freq: Two times a day (BID) | ORAL | Status: DC

## 2016-01-24 MED ORDER — LEVALBUTEROL HCL 0.63 MG/3ML IN NEBU: 0.6300 mg | INHALATION_SOLUTION | Freq: Two times a day (BID) | RESPIRATORY_TRACT | Status: DC

## 2016-01-24 MED ORDER — ALBUTEROL SULFATE HFA 108 (90 BASE) MCG/ACT IN AERS: INHALATION_SPRAY | RESPIRATORY_TRACT | Status: DC

## 2016-01-24 MED ORDER — LEVOFLOXACIN IN D5W 750 MG/150ML IV SOLN: 750.0000 mg | Freq: Every day | INTRAVENOUS | Status: DC

## 2016-01-24 MED ORDER — METHIMAZOLE 10 MG PO TABS: 10.0000 mg | ORAL_TABLET | Freq: Every day | ORAL | Status: DC

## 2016-01-24 MED ORDER — DILTIAZEM HCL ER COATED BEADS 120 MG PO CP24: 120.0000 mg | ORAL_CAPSULE | Freq: Every day | ORAL | Status: DC

## 2016-01-24 NOTE — Discharge Summary (Signed)
Physician Discharge Summary  Katherine Larson [id number] DOB: 07-13-1946 DOA: 01/18/2016  PCP: Katherine Schwalbe, MD  Admit date: 01/18/2016 Discharge date: 01/24/2016  Time spent: Greater than 30 minutes  Recommendations for Outpatient Follow-up:  1. Recommend reassessment of the patient's heart rate with the addition of diltiazem and Tapazole. Diltiazem could possibly be discontinued if she begins to run bradycardic. 2. Tapazole started for hyperthyroid induced tachycardia and mild thyrotoxicosis.     Discharge Diagnoses:  1. Sepsis secondary to community acquired pneumonia 2. COPD with exacerbation. 3. Newly diagnosed hyperthyroidism/thyrotoxicosis. 4. Sinus tachycardia secondary to hyperthyroidism. 5. Hypertension. 6. Diet-controlled diabetes with steroid-induced hyperglycemia. 7. Hyponatremia. 8. Hypokalemia. 9. Normocytic anemia. 10. Rheumatoid arthritis. 11. GERD.  Discharge Condition: Improved.  Diet recommendation: Heart healthy/carbohydrate modified.  Filed Weights   01/20/16 0500 01/21/16 0500 01/21/16 1752  Weight: 64.3 kg (141 lb 12.1 oz) 65 kg (143 lb 4.8 oz) 67.4 kg (148 lb 9.4 oz)    History of present illness:  Patient is a 70 year old woman with rheumatoid arthritis, diet-controlled diabetes, CAD, NSVT, and bronchitic asthma, who had recently finished a course of Tamiflu for influenza. She presented to the ED on 01/18/16 with a chief complaint of cough, fever, and malaise. In the ED, the chest x-ray showed bilateral airspace disease. She was admitted for further evaluation and management.  Hospital Course:  1. CAP On admission, the patient's chest x-ray revealed bibasilar airspace densities compatible with pneumonia. She was borderline febrile in the ED. Her white blood cell count was marginally elevated. She had recently completed a course of Tamiflu for influenza. She was started on IV Levaquin. Oxygen was started and titrated to keep her oxygen saturations  greater than 90%. She improved clinically and symptomatically. She remained afebrile. Her white blood cell count normalized. She was saturating in the mid to upper 90s on room air at rest and with ambulation at the time of discharge.-She was discharged on 3 more days of Levaquin.   Bronchitic COPD exacerbation. Patient had a few wheezes on exam, which could have been secondary to pneumonia. She developed some rhonchi. She uses Dulera and albuterol inhaler and Singulair chronically at home. Dulera inhaler was started on admission. IV Solu-Medrol was started and was titrated up to 60 mg every 8 hours. -Xopenex nebulizer was started cautiously in the setting of sinus tachycardia. Dose of metoprolol was decreased slightly to decrease beta agonist induced bronchospasms. -Patient improved clinically and symptomatically. She still had a few crackles on exam, but the extent of the wheezing has subsided. Her oxygen saturations on room air remained in the mid 90s even with ambulation. -She was discharged on a prednisone taper and was instructed to continue her chronic medications.  Sinus tachycardia; likely secondary to newly diagnosed hyperthyroidism/thyrotoxicosis.  Patient was noted to have tachycardia in the ED and it has persisted. EKG revealed sinus tachycardia with a heart rate of 110 bpm. She was started on gentle IV fluids for presumed mild volume depletion. The initial impression was that she was tachycardic from her respiratory illness or from chronic pain or from the initial decreased dose of metoprolol on admission.  -Metoprolol was decreased  to 25 mg twice a day due to wheezing and diltiazem was started for treatment of hypertension and tachycardia. -TSH was ordered and was low at 0.92. Free T4 was elevated at 2.08 and free T3 within normal limits. -Thyroid ultrasound was ordered and revealed small bilateral nodules or cysts which did not meet the consensus for biopsy. Follow-up  ultrasound in 12  months was recommended. -Endocrinologist, Katherine Larson was consulted. He believed she had thyrotoxicosis. He ordered another set of thyroid function tests and if they were still consistent with hyperthyroidism, he recommended starting Tapazole. -The follow-up thyroid indices still revealed hyperthyroidism. Tapazole was started. -She was less tachycardic on a combination of metoprolol and diltiazem. -She will follow-up with Katherine Larson for further recommendations and management.   Hypertension. The patient is treated with metoprolol and Cozaar chronically. Her blood pressures were initially low-normal, but it started trending up. -Metoprolol was decreased due to her diffuse rhonchus wheezing. Diltiazem was added for tachycardia and blood pressure control. Cozaar was continued.  -She was discharged on a lower dose of metoprolol, once daily diltiazem, and Cozaar. Her blood pressure was stable at the time of discharge.  Diet-controlled diabetes mellitus. Sliding scale NovoLog was started for an elevated blood glucose on IV Solu-Medrol. Her hemoglobin A1c was 5.8. Patient was advised to follow a low sugar diet at the time of discharge. It is anticipated that her CBGs will decrease or normalized off of the prednisone taper.  Hyponatremia. Patient serum sodium was 130 on admission. She was started on normal saline infusion. Her serum sodium has improved.   Hypokalemia. Patient's serum potassium was 3.3 on admission. She was started on potassium chloride. Her serum potassium has improved.  Normocytic anemia. Patient's hemoglobin ranges from 10.9-11. Anemia panel reveals a total iron of 20, TIBC of 225, ferritin of 1003, and vitamin B12 of 353. She was restarted on Niferex.  Rheumatoid arthritis. The patient has chronic pain from RA. She is treated with Plaquenil and Arava. Both were withheld due to her infection. Opiates were ordered as needed for pain. At the time of discharge, she was instructed to  restart her chronic medications.     Procedures:  None  Consultations:  Endocrinologist, Katherine Larson  Discharge Exam: Filed Vitals:   01/23/16 1531 01/24/16 0618  BP: 137/58 140/74  Pulse: 101 97  Temp: 98.8 F (37.1 C) 98.3 F (36.8 C)  Resp: 20 17   oxygen saturation 94% on room air.  General: Pleasant alert 70 year old woman sitting in the chair, in no acute distress. Cardiovascular: S1, S2, with borderline tachycardia and a soft systolic murmur. Respiratory: Few scattered crackles bilaterally; breathing nonlabored. Extremities: No pedal edema.  Discharge Instructions   Discharge Instructions    Diet - low sodium heart healthy    Complete by:  As directed      Discharge instructions    Complete by:  As directed   You have some new medications and some changes in your other chronic medications. Follow instructions as prescribed. Try to follow a low sugar diet while you're on prednisone.     Increase activity slowly    Complete by:  As directed           Discharge Medication List as of 01/24/2016 12:27 PM    START taking these medications   Details  diltiazem (CARDIZEM CD) 120 MG 24 hr capsule Take 1 capsule (120 mg total) by mouth daily. New add on blood pressure medicine., Starting 01/24/2016, Until Discontinued, Print    levofloxacin (LEVAQUIN) 750 MG tablet Take 1 tablet (750 mg total) by mouth daily. Antibiotic to take as directed for 3 days., Starting 01/24/2016, Until Discontinued, Print    methimazole (TAPAZOLE) 10 MG tablet Take 1 tablet (10 mg total) by mouth daily. For treatment of your thyroid disorder., Starting 01/24/2016, Until Discontinued, Print  predniSONE (DELTASONE) 10 MG tablet Starting tomorrow on 01/25/2016, take 6 tablets daily for 1 day; then 5 tablets the next day for 1 day; then 4 tablets the next day for 1 day; then 3 tablets the next day for 1 day; then 2 tablets the next day for 1 day; then 1 tablet the next day for 1 da y; then stop., Print       CONTINUE these medications which have CHANGED   Details  albuterol (PROVENTIL HFA;VENTOLIN HFA) 108 (90 Base) MCG/ACT inhaler Take 2 puffs 3 times a day and every 4 hours as needed., No Print    metoprolol tartrate (LOPRESSOR) 25 MG tablet Take 1 tablet (25 mg total) by mouth 2 (two) times daily., Starting 01/24/2016, Until Discontinued, No Print      CONTINUE these medications which have NOT CHANGED   Details  allopurinol (ZYLOPRIM) 300 MG tablet Take 300 mg by mouth daily., Until Discontinued, Historical Med    amitriptyline (ELAVIL) 10 MG tablet Take 20 mg by mouth at bedtime. , Until Discontinued, Historical Med    atorvastatin (LIPITOR) 80 MG tablet Take 80 mg by mouth daily., Until Discontinued, Historical Med    docusate sodium (COLACE) 100 MG capsule Take 1 capsule (100 mg total) by mouth every 12 (twelve) hours., Starting 01/09/2016, Until Discontinued, Print    DULoxetine (CYMBALTA) 30 MG capsule Take 60 mg by mouth daily., Until Discontinued, Historical Med    fexofenadine (ALLEGRA) 180 MG tablet Take 180 mg by mouth daily.  , Until Discontinued, Historical Med    fluticasone (FLONASE) 50 MCG/ACT nasal spray Place 2 sprays into both nostrils daily., Until Discontinued, Historical Med    furosemide (LASIX) 20 MG tablet Take 20 mg by mouth daily., Until Discontinued, Historical Med    hydroxychloroquine (PLAQUENIL) 200 MG tablet Take 400 mg by mouth daily., Until Discontinued, Historical Med    iron polysaccharides (NIFEREX) 150 MG capsule Take 150 mg by mouth daily., Until Discontinued, Historical Med    leflunomide (ARAVA) 20 MG tablet Take 20 mg by mouth daily., Until Discontinued, Historical Med    losartan (COZAAR) 100 MG tablet Take 100 mg by mouth daily., Until Discontinued, Historical Med    metoCLOPramide (REGLAN) 5 MG tablet Take 10 mg by mouth at bedtime. , Until Discontinued, Historical Med    mometasone-formoterol (DULERA) 100-5 MCG/ACT AERO Inhale 2 puffs  into the lungs daily as needed for wheezing or shortness of breath. , Until Discontinued, Historical Med    montelukast (SINGULAIR) 10 MG tablet Take 10 mg by mouth at bedtime., Until Discontinued, Historical Med    Multiple Vitamins-Minerals (MACULAR VITAMIN BENEFIT PO) Take 2 tablets by mouth daily., Until Discontinued, Historical Med    nitroGLYCERIN (NITROSTAT) 0.4 MG SL tablet Place 0.4 mg under the tongue every 5 (five) minutes as needed for chest pain. Reported on 11/08/2015, Until Discontinued, Historical Med    Omega-3 Fatty Acids (FISH OIL) 1000 MG CAPS Take 1,000 mg by mouth 3 (three) times daily with meals., Until Discontinued, Historical Med    omeprazole (PRILOSEC) 20 MG capsule Take 20 mg by mouth 2 (two) times daily before a meal., Until Discontinued, Historical Med    Polyethyl Glycol-Propyl Glycol (SYSTANE OP) Apply 1 drop to eye 3 (three) times daily as needed (Dry Eyes)., Until Discontinued, Historical Med    polyethylene glycol (MIRALAX) packet Take 17 g by mouth daily., Starting 01/09/2016, Until Discontinued, Print    sucralfate (CARAFATE) 1 GM/10ML suspension Take 10 mLs (1 g  total) by mouth 4 (four) times daily., Starting 12/22/2015, Until Discontinued, Normal    Tapentadol HCl (NUCYNTA) 75 MG TABS Take 75 mg by mouth every 8 (eight) hours as needed (for pain)., Until Discontinued, Historical Med    triamcinolone cream (KENALOG) 0.1 % Apply 1 application topically daily as needed (for irritation). , Until Discontinued, Historical Med    HYDROcodone-acetaminophen (NORCO/VICODIN) 5-325 MG tablet Take 1 tablet by mouth every 8 (eight) hours as needed., Starting 01/07/2016, Until Discontinued, Historical Med      STOP taking these medications     oseltamivir (TAMIFLU) 75 MG capsule        Allergies  Allergen Reactions  . Biaxin [Clarithromycin] Rash and Other (See Comments)    Blisters in mouth    Follow-up Information    Follow up with Katherine Lloyd, MD In 1 week.    Specialty:  Endocrinology   Contact information:   [place] [address] [phone]       Follow up with Katherine Schwalbe, MD On 02/07/2016.   Specialty:  Family Medicine   Why:  at 12:15 pm   Contact information:   [address] [place] [address] [phone]        The results of significant diagnostics from this hospitalization (including imaging, microbiology, ancillary and laboratory) are listed below for reference.    Significant Diagnostic Studies: Dg Chest 2 View  01/21/2016  CLINICAL DATA:  Shortness of breath, pneumonia EXAM: CHEST  2 VIEW COMPARISON:  01/18/2016 FINDINGS: Mild patchy bilateral lower lobe opacities, suspicious for pneumonia, unchanged. Possible small left pleural effusion. No pneumothorax. The heart is normal in size. Postsurgical changes related to prior CABG. Visualized osseous structures are within normal limits. IMPRESSION: Mild patchy bilateral lower lobe opacities, suspicious for pneumonia, unchanged. Possible small left pleural effusion. Electronically Signed   By: Julian Hy M.D.   On: 01/21/2016 14:40   Dg Chest 2 View  01/18/2016  CLINICAL DATA:  70 year old female with flu-like symptoms and fever. EXAM: CHEST  2 VIEW COMPARISON:  CT dated 09/02/2015 FINDINGS: Two views of the chest demonstrate bibasilar airspace densities most compatible with pneumonia. Clinical correlation and follow-up to resolution recommended. There is no pleural effusion or pneumothorax. The cardiac silhouette is within normal limits. Median sternotomy wires and CABG vascular clips noted. There is osteopenia with degenerative changes of the spine. No acute fracture. IMPRESSION: Bibasilar airspace densities most compatible with pneumonia. Follow-up recommended. Electronically Signed   By: Anner Crete M.D.   On: 01/18/2016 22:23   US Thyroid  01/21/2016  CLINICAL DATA:  Hyperthyroidism. Tachycardia, diabetes, hypertension, anemia.  EXAM: THYROID ULTRASOUND TECHNIQUE: Ultrasound examination of the thyroid gland and adjacent soft tissues was performed. COMPARISON:  None. FINDINGS: Right thyroid lobe Measurements: 54 x 23 x 19 mm. Mildly inhomogeneous background parenchyma. 4 x 3 x 3 mm hypoechoic nodule or cyst, inferior pole. Left thyroid lobe Measurements: 49 x 19 x 19 mm. 4 x 3 x 3 mm hypoechoic nodule or cyst, mid lobe. Isthmus Thickness: 9 mm.  6 x 3 x 6 mm hypoechoic nodule, right of midline. Lymphadenopathy None visualized. IMPRESSION: 1. 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 Findings do not meet current consensus criteria for biopsy. Follow-up by clinical exam is recommended. If patient has known risk factors for thyroid carcinoma, consider follow-up ultrasound in 12 months. If patient is clinically hyperthyroid, consider nuclear medicine thyroid uptake and scan. This recommendation follows the consensus statement: Management of  Thyroid Nodules Detected as [place]: Society of Radiologists in [place]. Radiology 2005; [id number]. Electronically Signed   By: Lucrezia Europe M.D.   On: 01/21/2016 13:26    Microbiology: Recent Results (from the past 240 hour(s))  Blood Culture (routine x 2)     Status: None   Collection Time: 01/18/16 10:58 PM  Result Value Ref Range Status   Specimen Description BLOOD LEFT ANTECUBITAL  Final   Special Requests BOTTLES DRAWN AEROBIC AND ANAEROBIC 8CC EACH  Final   Culture NO GROWTH 6 DAYS  Final   Report Status 01/24/2016 FINAL  Final  Blood Culture (routine x 2)     Status: None   Collection Time: 01/18/16 11:15 PM  Result Value Ref Range Status   Specimen Description BLOOD RIGHT FOREARM  Final   Special Requests BOTTLES DRAWN AEROBIC AND ANAEROBIC [place]  Final   Culture NO GROWTH 6 DAYS  Final   Report Status 01/24/2016 FINAL  Final  Urine culture     Status: None   Collection Time: 01/19/16  1:30 AM  Result Value Ref Range Status   Specimen  Description URINE, CLEAN CATCH  Final   Special Requests NONE  Final   Culture   Final    MULTIPLE SPECIES PRESENT, SUGGEST RECOLLECTION Performed at [facility]    Report Status 01/20/2016 FINAL  Final  MRSA PCR Screening     Status: None   Collection Time: 01/19/16  4:37 AM  Result Value Ref Range Status   MRSA by PCR NEGATIVE NEGATIVE Final    Comment:        The GeneXpert MRSA Assay (FDA approved for NASAL specimens only), is one component of a comprehensive MRSA colonization surveillance program. It is not intended to diagnose MRSA infection nor to guide or monitor treatment for MRSA infections.      Labs: Basic Metabolic Panel:  Recent Labs Lab 01/18/16 2140 01/20/16 0455 01/21/16 0429 01/24/16 0541  NA 130* 139 139 137  K 3.3* 3.6 4.5 3.9  CL 96* 106 106 101  CO2 22 23 24 27   GLUCOSE 187* 126* 152* 217*  BUN 10 6 8 20   CREATININE 0.54 0.39* 0.40* 0.53  CALCIUM 8.0* 8.0* 8.1* 8.1*   Liver Function Tests:  Recent Labs Lab 01/18/16 2140  AST 24  ALT 23  ALKPHOS 99  BILITOT 0.6  PROT 6.7  ALBUMIN 2.9*    Recent Labs Lab 01/18/16 2140  LIPASE 18   No results for input(s): AMMONIA in the last 168 hours. CBC:  Recent Labs Lab 01/18/16 2140 01/20/16 0455 01/21/16 0429 01/24/16 0541  WBC 10.8* 11.1* 5.5 10.3  HGB 11.4* 10.9* 10.8* 10.1*  HCT 32.6* 32.3* 32.1* 30.2*  MCV 88.8 91.2 92.0 92.9  PLT 191 273 306 364   Cardiac Enzymes: No results for input(s): CKTOTAL, CKMB, CKMBINDEX, TROPONINI in the last 168 hours. BNP: BNP (last 3 results)  Recent Labs  01/19/16 1040  BNP 931.0*    ProBNP (last 3 results) No results for input(s): PROBNP in the last 8760 hours.  CBG:  Recent Labs Lab 01/23/16 0758 01/23/16 1159 01/23/16 1534 01/23/16 2021 01/24/16 0729  GLUCAP 165* 196* 214* 201* 180*       Signed:  Juan Olthoff MD.  [facility] 01/24/2016, 4:38 PM

## 2016-01-24 NOTE — Care Management Note (Signed)
Case Management Note  Patient Details  Name: Katherine Larson MRN: [id number] Date of Birth: June 22, 1946   Expected Discharge Date:       01/24/2016           Expected Discharge Plan:  Home/Self Care  In-House Referral:  NA  Discharge planning Services  CM Consult  Post Acute Care Choice:  NA Choice offered to:  NA  DME Arranged:    DME Agency:     HH Arranged:    [place] Agency:     Status of Service:  Completed, signed off  Medicare Important Message Given:  Yes Date Medicare IM Given:    Medicare IM give by:    Date Additional Medicare IM Given:    Additional Medicare Important Message give by:     If discussed at [place] of Stay Meetings, dates discussed:    Additional Comments: Pt discharging home today with self care. No CM needs.  Sherald Barge, RN 01/24/2016, 10:39 AM

## 2016-01-24 NOTE — Progress Notes (Signed)
Discharge instructions and prescriptions given, verbalized understanding, out in stable condition via w/c with staff. 

## 2016-01-24 NOTE — Care Management Important Message (Signed)
Important Message  Patient Details  Name: Katherine Larson MRN: [id number] Date of Birth: May 18, 1946   Medicare Important Message Given:  Yes    Sherald Barge, RN 01/24/2016, 10:38 AM

## 2016-01-26 ENCOUNTER — Ambulatory Visit: Payer: Medicare Other | Admitting: Gastroenterology

## 2016-02-07 ENCOUNTER — Other Ambulatory Visit: Payer: Self-pay | Admitting: Family Medicine

## 2016-02-07 ENCOUNTER — Ambulatory Visit
Admission: RE | Admit: 2016-02-07 | Discharge: 2016-02-07 | Disposition: A | Discharge status: Home / Self Care | Payer: Medicare Other | Source: Ambulatory Visit | Attending: Family Medicine | Admitting: Family Medicine

## 2016-02-07 DIAGNOSIS — J189 Pneumonia, unspecified organism: Secondary | ICD-10-CM

## 2016-02-14 ENCOUNTER — Encounter: Payer: Self-pay | Admitting: "Endocrinology

## 2016-02-14 ENCOUNTER — Ambulatory Visit (INDEPENDENT_AMBULATORY_CARE_PROVIDER_SITE_OTHER): Payer: Medicare Other | Admitting: "Endocrinology

## 2016-02-14 VITALS — BP 178/96 | HR 96 | Resp 18 | Ht 64.0 in | Wt 137.0 lb

## 2016-02-14 DIAGNOSIS — E785 Hyperlipidemia, unspecified: Secondary | ICD-10-CM

## 2016-02-14 DIAGNOSIS — E119 Type 2 diabetes mellitus without complications: Secondary | ICD-10-CM | POA: Diagnosis not present

## 2016-02-14 DIAGNOSIS — I10 Essential (primary) hypertension: Secondary | ICD-10-CM

## 2016-02-14 DIAGNOSIS — E0590 Thyrotoxicosis, unspecified without thyrotoxic crisis or storm: Secondary | ICD-10-CM | POA: Diagnosis not present

## 2016-02-14 NOTE — Progress Notes (Signed)
Subjective:    Patient ID: Katherine Larson, female    DOB: 1946/10/18, PCP [name], MD.   Past Medical History  Diagnosis Date  . NSVT (nonsustained ventricular tachycardia) ([place])   . DM2 (diabetes mellitus, type 2) ([place])   . CAD (coronary artery disease)     s/p CABG 1996;  Myoview 3/11: mod partially reversible AS perfusion defect, most likely related to variable breast attenuation although an element of scar with peri-infarct ischemia also possible, EF 54% - felt to be low risk  . HTN (hypertension)   . HLD (hyperlipidemia)   . Asthma   . Neuropathy ([place])   . Complication of anesthesia   . PONV (postoperative nausea and vomiting)   . Shortness of breath     with activity  . Seasonal allergies   . GERD (gastroesophageal reflux disease)   . Constipation   . Diarrhea   . Rheumatoid arthritis (HCC)     RA  . Gout   . Anemia     anemia of chronic disease +/- IDA followed by Dr. Earlie Server. received iron infusions in the past  . Borderline glaucoma   . Hyperthyroidism 01/21/2016   Past Surgical History  Procedure Laterality Date  . Cholecystectomy    . Ptca    . Coronary artery bypass graft  1996    2 vessels  . Hernia repair      hiatal hernia  . Eye surgery Bilateral   . Abdominal hysterectomy    . Fracture surgery Right     arm  . Maximum access (mas)posterior lumbar interbody fusion (plif) 1 level N/A 08/14/2013    Procedure:  MAXIMUM ACCESS SURGERY(MAS) POSTERIOR LUMBAR INTERBODY FUSION LUMBAR THREE-FOUR ;  Surgeon: Eustace Moore, MD;  Location: [place] [facility];  Service: Neurosurgery;  Laterality: N/A;   MAXIMUM ACCESS SURGERY(MAS) POSTERIOR LUMBAR INTERBODY FUSION LUMBAR THREE-FOUR   . Appendectomy    . Colonoscopy  11/2011    Dr. Magod:ext/int hemorrhoids, diverticulosis sigmoid colon and distal desc colon, mid-desc colon hyperplastic polyps.   . Esophagogastroduodenoscopy  11/2011    Dr. Watt Climes: small hiatal hernia, one non-bleeding superficial gastric ulcer,  medium-sized diverticulum in area of papilla  . Givens capsule study  11/2012    Dr. Watt Climes: minimal gastritis, normal small bowel capsule  . Esophagogastroduodenoscopy N/A 12/29/2015    Procedure: ESOPHAGOGASTRODUODENOSCOPY (EGD);  Surgeon: Daneil Dolin, MD;  [facility];  Service: Endoscopy;  Laterality: N/A;  0930 - moved to 2/8 @ 1:45 - office to notify pt   Social History   Social History  . Marital Status: Married    Spouse Name: N/A  . Number of Children: 3  . Years of Education: N/A   Social History Main Topics  . Smoking status: Former Smoker -- 15 years    Quit date: 11/20/1994  . Smokeless tobacco: None  . Alcohol Use: No  . Drug Use: No  . Sexual Activity: Not Asked   Other Topics Concern  . None   Social History Narrative   2 living children, one deceased age 42      Outpatient Encounter Prescriptions as of 02/14/2016  Medication Sig  . albuterol (PROVENTIL HFA;VENTOLIN HFA) 108 (90 Base) MCG/ACT inhaler Take 2 puffs 3 times a day and every 4 hours as needed.  Marland Kitchen allopurinol (ZYLOPRIM) 300 MG tablet Take 300 mg by mouth daily.  Marland Kitchen amitriptyline (ELAVIL) 10 MG tablet Take 20 mg by mouth at bedtime.   Marland Kitchen  atorvastatin (LIPITOR) 80 MG tablet Take 80 mg by mouth daily.  Marland Kitchen diltiazem (CARDIZEM CD) 120 MG 24 hr capsule Take 1 capsule (120 mg total) by mouth daily. New add on blood pressure medicine.  . docusate sodium (COLACE) 100 MG capsule Take 1 capsule (100 mg total) by mouth every 12 (twelve) hours.  . DULoxetine (CYMBALTA) 30 MG capsule Take 60 mg by mouth daily.  . fexofenadine (ALLEGRA) 180 MG tablet Take 180 mg by mouth daily.    . fluticasone (FLONASE) 50 MCG/ACT nasal spray Place 2 sprays into both nostrils daily.  . furosemide (LASIX) 20 MG tablet Take 20 mg by mouth daily.  Marland Kitchen HYDROcodone-acetaminophen (NORCO/VICODIN) 5-325 MG tablet Take 1 tablet by mouth every 8 (eight) hours as needed.  . hydroxychloroquine (PLAQUENIL) 200 MG tablet Take 400 mg by  mouth daily.  . iron polysaccharides (NIFEREX) 150 MG capsule Take 150 mg by mouth daily.  Marland Kitchen leflunomide (ARAVA) 20 MG tablet Take 20 mg by mouth daily.  Marland Kitchen losartan (COZAAR) 100 MG tablet Take 100 mg by mouth daily.  . methimazole (TAPAZOLE) 10 MG tablet Take 1 tablet (10 mg total) by mouth daily. For treatment of your thyroid disorder.  . metoCLOPramide (REGLAN) 5 MG tablet Take 10 mg by mouth at bedtime.   . metoprolol tartrate (LOPRESSOR) 25 MG tablet Take 1 tablet (25 mg total) by mouth 2 (two) times daily.  . mometasone-formoterol (DULERA) 100-5 MCG/ACT AERO Inhale 2 puffs into the lungs daily as needed for wheezing or shortness of breath.   . montelukast (SINGULAIR) 10 MG tablet Take 10 mg by mouth at bedtime.  . Multiple Vitamins-Minerals (MACULAR VITAMIN BENEFIT PO) Take 2 tablets by mouth daily.  . nitroGLYCERIN (NITROSTAT) 0.4 MG SL tablet Place 0.4 mg under the tongue every 5 (five) minutes as needed for chest pain. Reported on 11/08/2015  . Omega-3 Fatty Acids (FISH OIL) 1000 MG CAPS Take 1,000 mg by mouth 3 (three) times daily with meals.  Marland Kitchen omeprazole (PRILOSEC) 20 MG capsule Take 20 mg by mouth 2 (two) times daily before a meal.  . Polyethyl Glycol-Propyl Glycol (SYSTANE OP) Apply 1 drop to eye 3 (three) times daily as needed (Dry Eyes).  . polyethylene glycol (MIRALAX) packet Take 17 g by mouth daily.  . sucralfate (CARAFATE) 1 GM/10ML suspension Take 10 mLs (1 g total) by mouth 4 (four) times daily.  . Tapentadol HCl (NUCYNTA) 75 MG TABS Take 75 mg by mouth every 8 (eight) hours as needed (for pain).  . triamcinolone cream (KENALOG) 0.1 % Apply 1 application topically daily as needed (for irritation).   . [DISCONTINUED] levofloxacin (LEVAQUIN) 750 MG tablet Take 1 tablet (750 mg total) by mouth daily. Antibiotic to take as directed for 3 days.  . [DISCONTINUED] predniSONE (DELTASONE) 10 MG tablet Starting tomorrow on 01/25/2016, take 6 tablets daily for 1 day; then 5 tablets the  next day for 1 day; then 4 tablets the next day for 1 day; then 3 tablets the next day for 1 day; then 2 tablets the next day for 1 day; then 1 tablet the next day for 1 day; then stop.   No facility-administered encounter medications on file as of 02/14/2016.   ALLERGIES:  VACCINATION STATUS:  There is no immunization history on file for this patient.  HPI  NICCI MOSSER is a 70 y.o. female with hx of ventricular tachycardia, type 2 diabetes, coronary artery disease s/p CABG, hypertension, hyperlipidemia, asthma. She Was recently hospitalized for  community-acquired pneumonia at [facility].  - Due to history of unexplained weight loss and palpitations she underwent thyroid function test which showed evidence of hyperthyroidism.  -She did not undergo thyroid uptake and scan due to the fact that she was exposed to IV contrast in the workup of abdominal pain.  -She was discharged on Tapazole 10 mg by mouth daily associated with beta blocker. She has seen significant symptomatic relief since discharge. She does not have new complaints today.    -  she has lost approximately 25 pounds since December associated with palpitations, tremors, heat intolerance and hot flashes.  She does have family history of thyroid problem in her mother. The patient denies personal history of goiter.   Review of Systems Constitutional: + weight loss, + fatigue, + subjective hyperthermia Eyes: no blurry vision, no xerophthalmia ENT: no sore throat, no nodules palpated in throat, no dysphagia/odynophagia, no hoarseness Cardiovascular: no CP, +SOB, +alpitations Respiratory: +cough, +SOB Gastrointestinal: she has had vaguely localized abdominal pain, no N/V/D/C Musculoskeletal: no muscle/joint aches Skin: no rashes Neurological: + tremors Psychiatric: no depression/anxiety Objective:    BP 178/96 mmHg  Pulse 96  Resp 18  Ht 5\' 4"  (1.626 m)  Wt 137 lb (62.143 kg)  BMI 23.50 kg/m2  SpO2 100%   Wt Readings from Last 3 Encounters:  02/14/16 137 lb (62.143 kg)  01/21/16 148 lb 9.4 oz (67.4 kg)  01/08/16 140 lb (63.504 kg)    Physical Exam   Constitutional: overweight, in NAD Eyes: PERRLA, EOMI, no exophthalmos ENT: moist mucous membranes, no thyromegaly, no cervical lymphadenopathy Cardiovascular: RRR, No MRG Respiratory: CTA B Gastrointestinal: abdomen soft, NT, ND, BS+ Musculoskeletal: no deformities, strength intact in all 4 Skin: moist, warm, no rashes Neurological: + tremor with outstretched hands Which is improving from last encounter, DTR are brisk in all Extremities.  Complete Blood Count (Most recent): Lab Results  Component Value Date   WBC 10.3 01/24/2016   HGB 10.1* 01/24/2016   HCT 30.2* 01/24/2016   MCV 92.9 01/24/2016   PLT 364 01/24/2016   Chemistry (most recent): Lab Results  Component Value Date   NA 137 01/24/2016   K 3.9 01/24/2016   CL 101 01/24/2016   CO2 27 01/24/2016   BUN 20 01/24/2016   CREATININE 0.53 01/24/2016   Diabetic Labs (most recent): Lab Results  Component Value Date   HGBA1C 5.8* 01/19/2016   Lipid Panel     Component Value Date/Time   CHOL 112 12/19/2007 0910   TRIG 177* 12/19/2007 0910   HDL 30.2* 12/19/2007 0910   CHOLHDL 3.7 CALC 12/19/2007 0910   VLDL 35 12/19/2007 0910   LDLCALC 46 12/19/2007 0910    Results for MERYEM, NEELEY (MRN [id number]) as of 02/14/2016 10:04  Ref. Range 01/19/2016 12:00 01/20/2016 04:55 01/21/2016 04:29 01/22/2016 06:10 01/24/2016 05:41  TSH Latest Ref Range: 0.350-4.500 uIU/mL    0.129 (L)   T4,Free(Direct) Latest Ref Range: 0.61-1.12 ng/dL    1.77 (H)   Triiodothyronine,Free,Serum Latest Ref Range: 2.0-4.4 pg/mL 2.5   2.7   Thyroperoxidase Ab SerPl-aCnc Latest Ref Range: 0-34 IU/mL    <6   Thyroglobulin Antibody Latest Ref Range: 0.0-0.9 IU/mL    <1.0     Assessment & Plan:   1. Hyperthyroidism  Patient's history and most recent labs are reviewed. She was diagnosed with  hyperthyroidism when she was hospitalized early March 2017. -Due to the fact that she was exposed to IV contrast, thyroid uptake and scan was  not performed. She was initiated on therapy with Tapazole 10 mg by mouth daily and maintained on metoprolol. She has improved symptomatically.  -Given her cardiac history, she would benefit from RAI thyroid ablative therapy. I will proceed to obtain thyroid uptake and scan in preparation for this. I advised her to stop Tapazole today to avoid interference in the uptake study, will perform thyroid uptake and scan in a week time and patient will return in 2 weeks for treatment decision. I have advised her to remain on metoprolol.   2. Type 2 diabetes mellitus without complication, without long-term current use of insulin (HCC) -Controlled without medications.  3. Essential hypertension - Not on target however patient has multiple medications for high blood pressure. I advised her to be consistent in her medications.  4. Hyperlipidemia -She is advised to remain on atorvastatin 80 mg by mouth daily at bedtime.  - I advised patient to maintain close follow up with [name], MD for primary care needs.  Follow up plan: Return in about 2 weeks (around 02/28/2016) for overactive thyroid.  Glade Lloyd, MD Phone: [phone]  Fax: [phone]   02/14/2016, 10:01 AM

## 2016-02-14 NOTE — Patient Instructions (Signed)
Please be advised to stop Tapazole today 02/14/2016. He will be called for spatial thyroid scan in 1 week. you will return for a follow-up visit in 2 weeks.

## 2016-02-18 ENCOUNTER — Ambulatory Visit
Admission: RE | Admit: 2016-02-18 | Discharge: 2016-02-18 | Disposition: A | Discharge status: Home / Self Care | Payer: Medicare Other | Source: Ambulatory Visit | Attending: Family Medicine | Admitting: Family Medicine

## 2016-02-18 DIAGNOSIS — M542 Cervicalgia: Secondary | ICD-10-CM

## 2016-02-18 DIAGNOSIS — M549 Dorsalgia, unspecified: Secondary | ICD-10-CM

## 2016-02-22 ENCOUNTER — Encounter (HOSPITAL_COMMUNITY)
Admission: RE | Admit: 2016-02-22 | Discharge: 2016-02-22 | Disposition: A | Discharge status: Home / Self Care | Payer: Medicare Other | Source: Ambulatory Visit | Attending: "Endocrinology | Admitting: "Endocrinology

## 2016-02-22 ENCOUNTER — Encounter (HOSPITAL_COMMUNITY): Payer: Self-pay

## 2016-02-22 DIAGNOSIS — E0590 Thyrotoxicosis, unspecified without thyrotoxic crisis or storm: Secondary | ICD-10-CM | POA: Insufficient documentation

## 2016-02-22 MED ORDER — SODIUM IODIDE I 131 CAPSULE
14.0000 | Freq: Once | INTRAVENOUS | Status: AC | PRN
  Administered 2016-02-22: 14 via ORAL

## 2016-02-23 ENCOUNTER — Encounter (HOSPITAL_COMMUNITY)
Admission: RE | Admit: 2016-02-23 | Discharge: 2016-02-23 | Disposition: A | Discharge status: Home / Self Care | Payer: Medicare Other | Source: Ambulatory Visit | Attending: "Endocrinology | Admitting: "Endocrinology

## 2016-02-23 DIAGNOSIS — E0590 Thyrotoxicosis, unspecified without thyrotoxic crisis or storm: Secondary | ICD-10-CM | POA: Diagnosis not present

## 2016-02-23 MED ORDER — SODIUM PERTECHNETATE TC 99M INJECTION
10.0000 | Freq: Once | INTRAVENOUS | Status: AC | PRN
  Administered 2016-02-23: 10 via INTRAVENOUS

## 2016-03-01 ENCOUNTER — Encounter: Payer: Self-pay | Admitting: "Endocrinology

## 2016-03-01 ENCOUNTER — Ambulatory Visit (INDEPENDENT_AMBULATORY_CARE_PROVIDER_SITE_OTHER): Payer: Medicare Other | Admitting: "Endocrinology

## 2016-03-01 VITALS — BP 136/83 | HR 106 | Ht 64.0 in | Wt 133.0 lb

## 2016-03-01 DIAGNOSIS — E0590 Thyrotoxicosis, unspecified without thyrotoxic crisis or storm: Secondary | ICD-10-CM | POA: Diagnosis not present

## 2016-03-01 MED ORDER — METOPROLOL TARTRATE 50 MG PO TABS: 50.0000 mg | ORAL_TABLET | Freq: Two times a day (BID) | ORAL | Status: DC

## 2016-03-01 MED ORDER — METHIMAZOLE 10 MG PO TABS: 10.0000 mg | ORAL_TABLET | Freq: Every day | ORAL | Status: DC

## 2016-03-01 NOTE — Progress Notes (Signed)
Subjective:    Patient ID: Katherine Larson, female    DOB: 09/27/1946, PCP [name], MD.   Past Medical History  Diagnosis Date  . NSVT (nonsustained ventricular tachycardia) ([place])   . DM2 (diabetes mellitus, type 2) ([place])   . CAD (coronary artery disease)     s/p CABG 1996;  Myoview 3/11: mod partially reversible AS perfusion defect, most likely related to variable breast attenuation although an element of scar with peri-infarct ischemia also possible, EF 54% - felt to be low risk  . HTN (hypertension)   . HLD (hyperlipidemia)   . Asthma   . Neuropathy ([place])   . Complication of anesthesia   . PONV (postoperative nausea and vomiting)   . Shortness of breath     with activity  . Seasonal allergies   . GERD (gastroesophageal reflux disease)   . Constipation   . Diarrhea   . Rheumatoid arthritis (HCC)     RA  . Gout   . Anemia     anemia of chronic disease +/- IDA followed by Dr. Earlie Server. received iron infusions in the past  . Borderline glaucoma   . Hyperthyroidism 01/21/2016   Past Surgical History  Procedure Laterality Date  . Cholecystectomy    . Ptca    . Coronary artery bypass graft  1996    2 vessels  . Hernia repair      hiatal hernia  . Eye surgery Bilateral   . Abdominal hysterectomy    . Fracture surgery Right     arm  . Maximum access (mas)posterior lumbar interbody fusion (plif) 1 level N/A 08/14/2013    Procedure:  MAXIMUM ACCESS SURGERY(MAS) POSTERIOR LUMBAR INTERBODY FUSION LUMBAR THREE-FOUR ;  Surgeon: Eustace Moore, MD;  Location: [place] [facility];  Service: Neurosurgery;  Laterality: N/A;   MAXIMUM ACCESS SURGERY(MAS) POSTERIOR LUMBAR INTERBODY FUSION LUMBAR THREE-FOUR   . Appendectomy    . Colonoscopy  11/2011    Dr. Magod:ext/int hemorrhoids, diverticulosis sigmoid colon and distal desc colon, mid-desc colon hyperplastic polyps.   . Esophagogastroduodenoscopy  11/2011    Dr. Watt Climes: small hiatal hernia, one non-bleeding superficial gastric ulcer,  medium-sized diverticulum in area of papilla  . Givens capsule study  11/2012    Dr. Watt Climes: minimal gastritis, normal small bowel capsule  . Esophagogastroduodenoscopy N/A 12/29/2015    Procedure: ESOPHAGOGASTRODUODENOSCOPY (EGD);  Surgeon: Daneil Dolin, MD;  [facility];  Service: Endoscopy;  Laterality: N/A;  0930 - moved to 2/8 @ 1:45 - office to notify pt   Social History   Social History  . Marital Status: Married    Spouse Name: N/A  . Number of Children: 3  . Years of Education: N/A   Social History Main Topics  . Smoking status: Former Smoker -- 15 years    Quit date: 11/20/1994  . Smokeless tobacco: None  . Alcohol Use: No  . Drug Use: No  . Sexual Activity: Not Asked   Other Topics Concern  . None   Social History Narrative   2 living children, one deceased age 30      Outpatient Encounter Prescriptions as of 03/01/2016  Medication Sig  . albuterol (PROVENTIL HFA;VENTOLIN HFA) 108 (90 Base) MCG/ACT inhaler Take 2 puffs 3 times a day and every 4 hours as needed.  Marland Kitchen allopurinol (ZYLOPRIM) 300 MG tablet Take 300 mg by mouth daily.  Marland Kitchen amitriptyline (ELAVIL) 10 MG tablet Take 20 mg by mouth at bedtime.   Marland Kitchen  atorvastatin (LIPITOR) 80 MG tablet Take 80 mg by mouth daily.  Marland Kitchen diltiazem (CARDIZEM CD) 120 MG 24 hr capsule Take 1 capsule (120 mg total) by mouth daily. New add on blood pressure medicine.  . docusate sodium (COLACE) 100 MG capsule Take 1 capsule (100 mg total) by mouth every 12 (twelve) hours.  . DULoxetine (CYMBALTA) 30 MG capsule Take 60 mg by mouth daily.  . fexofenadine (ALLEGRA) 180 MG tablet Take 180 mg by mouth daily.    . fluticasone (FLONASE) 50 MCG/ACT nasal spray Place 2 sprays into both nostrils daily.  . furosemide (LASIX) 20 MG tablet Take 20 mg by mouth daily.  Marland Kitchen HYDROcodone-acetaminophen (NORCO/VICODIN) 5-325 MG tablet Take 1 tablet by mouth every 8 (eight) hours as needed.  . hydroxychloroquine (PLAQUENIL) 200 MG tablet Take 400 mg by  mouth daily.  . iron polysaccharides (NIFEREX) 150 MG capsule Take 150 mg by mouth daily.  Marland Kitchen leflunomide (ARAVA) 20 MG tablet Take 20 mg by mouth daily.  Marland Kitchen losartan (COZAAR) 100 MG tablet Take 100 mg by mouth daily.  . methimazole (TAPAZOLE) 10 MG tablet Take 1 tablet (10 mg total) by mouth daily. For treatment of your thyroid disorder.  . metoCLOPramide (REGLAN) 5 MG tablet Take 10 mg by mouth at bedtime.   . metoprolol tartrate (LOPRESSOR) 50 MG tablet Take 1 tablet (50 mg total) by mouth 2 (two) times daily.  . mometasone-formoterol (DULERA) 100-5 MCG/ACT AERO Inhale 2 puffs into the lungs daily as needed for wheezing or shortness of breath.   . montelukast (SINGULAIR) 10 MG tablet Take 10 mg by mouth at bedtime.  . Multiple Vitamins-Minerals (MACULAR VITAMIN BENEFIT PO) Take 2 tablets by mouth daily.  . nitroGLYCERIN (NITROSTAT) 0.4 MG SL tablet Place 0.4 mg under the tongue every 5 (five) minutes as needed for chest pain. Reported on 11/08/2015  . Omega-3 Fatty Acids (FISH OIL) 1000 MG CAPS Take 1,000 mg by mouth 3 (three) times daily with meals.  Marland Kitchen omeprazole (PRILOSEC) 20 MG capsule Take 20 mg by mouth 2 (two) times daily before a meal.  . Polyethyl Glycol-Propyl Glycol (SYSTANE OP) Apply 1 drop to eye 3 (three) times daily as needed (Dry Eyes).  . polyethylene glycol (MIRALAX) packet Take 17 g by mouth daily.  . sucralfate (CARAFATE) 1 GM/10ML suspension Take 10 mLs (1 g total) by mouth 4 (four) times daily.  . Tapentadol HCl (NUCYNTA) 75 MG TABS Take 75 mg by mouth every 8 (eight) hours as needed (for pain).  . triamcinolone cream (KENALOG) 0.1 % Apply 1 application topically daily as needed (for irritation).   . [DISCONTINUED] methimazole (TAPAZOLE) 10 MG tablet Take 1 tablet (10 mg total) by mouth daily. For treatment of your thyroid disorder.  . [DISCONTINUED] metoprolol tartrate (LOPRESSOR) 25 MG tablet Take 1 tablet (25 mg total) by mouth 2 (two) times daily.   No  facility-administered encounter medications on file as of 03/01/2016.   ALLERGIES:  VACCINATION STATUS:  There is no immunization history on file for this patient.  HPI  Katherine Larson is a 70 y.o. female with hx of ventricular tachycardia, type 2 diabetes, coronary artery disease s/p CABG, hypertension, hyperlipidemia, asthma. She Was recently hospitalized for  community-acquired pneumonia at [facility].  - Due to history of unexplained weight loss and palpitations she underwent thyroid function test which showed evidence of hyperthyroidism. -After brief treatment with Tapazole, she underwent thyroid uptake and scan which showed low uptake of 15% with no evidence  of focal abnormality.  -  she has lost approximately 25 pounds since December associated with palpitations, tremors, heat intolerance and hot flashes.  She does have family history of thyroid problem in her mother. The patient denies personal history of goiter.   Review of Systems Constitutional: + weight loss, + fatigue, + subjective hyperthermia Eyes: no blurry vision, no xerophthalmia ENT: no sore throat, no nodules palpated in throat, no dysphagia/odynophagia, no hoarseness Cardiovascular: no CP, +SOB, +alpitations Respiratory: +cough, +SOB Gastrointestinal: she has had vaguely localized abdominal pain, no N/V/D/C Musculoskeletal: no muscle/joint aches Skin: no rashes Neurological: + tremors Psychiatric: no depression/anxiety Objective:    BP 136/83 mmHg  Pulse 106  Ht 5\' 4"  (1.626 m)  Wt 133 lb (60.328 kg)  BMI 22.82 kg/m2  SpO2 98%  Wt Readings from Last 3 Encounters:  03/01/16 133 lb (60.328 kg)  02/14/16 137 lb (62.143 kg)  01/21/16 148 lb 9.4 oz (67.4 kg)    Physical Exam   Constitutional: overweight, in NAD Eyes: PERRLA, EOMI, no exophthalmos ENT: moist mucous membranes, no thyromegaly, no cervical lymphadenopathy Cardiovascular: RRR, No MRG Respiratory: CTA B Gastrointestinal: abdomen  soft, NT, ND, BS+ Musculoskeletal: no deformities, strength intact in all 4 Skin: moist, warm, no rashes Neurological:  Mild tremor with outstretched hands Which is improving from last encounter, DTR are brisk in all Extremities.  Complete Blood Count (Most recent): Lab Results  Component Value Date   WBC 10.3 01/24/2016   HGB 10.1* 01/24/2016   HCT 30.2* 01/24/2016   MCV 92.9 01/24/2016   PLT 364 01/24/2016   Chemistry (most recent): Lab Results  Component Value Date   NA 137 01/24/2016   K 3.9 01/24/2016   CL 101 01/24/2016   CO2 27 01/24/2016   BUN 20 01/24/2016   CREATININE 0.53 01/24/2016   Diabetic Labs (most recent): Lab Results  Component Value Date   HGBA1C 5.8* 01/19/2016   Lipid Panel     Component Value Date/Time   CHOL 112 12/19/2007 0910   TRIG 177* 12/19/2007 0910   HDL 30.2* 12/19/2007 0910   CHOLHDL 3.7 CALC 12/19/2007 0910   VLDL 35 12/19/2007 0910   LDLCALC 46 12/19/2007 0910    Results for Katherine Larson, Katherine Larson (MRN [id number]) as of 02/14/2016 10:04  Ref. Range 01/19/2016 12:00 01/20/2016 04:55 01/21/2016 04:29 01/22/2016 06:10 01/24/2016 05:41  TSH Latest Ref Range: 0.350-4.500 uIU/mL    0.129 (L)   T4,Free(Direct) Latest Ref Range: 0.61-1.12 ng/dL    1.77 (H)   Triiodothyronine,Free,Serum Latest Ref Range: 2.0-4.4 pg/mL 2.5   2.7   Thyroperoxidase Ab SerPl-aCnc Latest Ref Range: 0-34 IU/mL    <6   Thyroglobulin Antibody Latest Ref Range: 0.0-0.9 IU/mL    <1.0    Thyroid uptake and scan at 15% uniform  Assessment & Plan:   1. Hyperthyroidism  Patient's history and most recent labs are reviewed. She was diagnosed with hyperthyroidism when she was hospitalized early March 2017. - Her thyroid uptake and scan does not suggest significantly active thyroid. However, given her cardiac history, she would benefit from antithyroid therapy. I decided to hold off on RAI thyroid ablative therapy.   I advised her to resume Tapazole 10 mg by mouth daily. Side  effects and precautions discussed with her. I advised her to increase her metoprolol to 50 minute grams by mouth twice a day to help with palpitations.   2. Type 2 diabetes mellitus without complication, without long-term current use of insulin (HCC) -Controlled without medications.  3. Essential hypertension - Not on target however patient has multiple medications for high blood pressure. I advised her to be consistent in her medications.  4. Hyperlipidemia -She is advised to remain on atorvastatin 80 mg by mouth daily at bedtime.  - I advised patient to maintain close follow up with [name], MD for primary care needs.  Follow up plan: Return in about 10 weeks (around 05/10/2016) for overactive thyroid.  Glade Lloyd, MD Phone: [phone]  Fax: [phone]   03/01/2016, 4:38 PM

## 2016-03-07 ENCOUNTER — Telehealth: Payer: Self-pay

## 2016-03-07 ENCOUNTER — Ambulatory Visit (INDEPENDENT_AMBULATORY_CARE_PROVIDER_SITE_OTHER): Payer: Medicare Other | Admitting: Gastroenterology

## 2016-03-07 ENCOUNTER — Encounter: Payer: Self-pay | Admitting: Gastroenterology

## 2016-03-07 VITALS — BP 125/73 | HR 94 | Temp 97.7°F | Ht 64.5 in | Wt 134.4 lb

## 2016-03-07 DIAGNOSIS — G8929 Other chronic pain: Secondary | ICD-10-CM | POA: Diagnosis not present

## 2016-03-07 DIAGNOSIS — R1030 Lower abdominal pain, unspecified: Secondary | ICD-10-CM

## 2016-03-07 DIAGNOSIS — R1013 Epigastric pain: Secondary | ICD-10-CM | POA: Diagnosis not present

## 2016-03-07 DIAGNOSIS — R634 Abnormal weight loss: Secondary | ICD-10-CM | POA: Diagnosis not present

## 2016-03-07 NOTE — Telephone Encounter (Signed)
Per Lorriane Shire at [facility] # is [id number] Valid from 03/07/2016-04/05/2016  Pt is set for CTA on 03/10/2016 @ 215pm  Pt is aware

## 2016-03-07 NOTE — Patient Instructions (Signed)
1. CT scan to evaluate blood vessels feeding your intestines as discussed. We will call you with results within 5-7 business days after your test.

## 2016-03-07 NOTE — Progress Notes (Signed)
cc'ed to pcp °

## 2016-03-07 NOTE — Progress Notes (Signed)
Primary Care Physician: [name], MD  Primary Gastroenterologist:  Garfield Cornea, MD   Chief Complaint  Patient presents with  . Follow-up    abdominal pain    HPI: Katherine Larson is a 70 y.o. female here For follow-up. She presented to establish care with [place] back in January. She has an extensive GI history, see office visit note from 12/08/2015 for complete details. Specifically has chronic abdominal pain, GERD, unintentional weight loss, solid food dysphagia. 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 Duodenal diverticula noted. She notes her dysphagia has improved status post dilation.  CT done through the ER on 12/23/2015 with contrast with old partial compression deformity of T11 vertebral body slightly increased since May 2016 CT, no explanation for patient's upper abdominal pain.  Patient was hospitalized back in March with sepsis secondary to community-acquired pneumonia, newly diagnosed hyperthyroidism/thyrotoxicosis.  Patient reports that her abdominal pain remains unchanged. It's fairly constant but definitely worse with meals. Patient wait 190 pounds back in August 2015. June 2016 she weighed 169 pounds. January 2017 she weighed 151 pounds. Today she weighs 134 pounds. Total of 65 pounds in less than 2 years. No vomiting. BM regular. No significant diarrhea. No melena, brbpr. Occasional heartburn. Carafate helps her abdominal pain somewhat. She takes it 2-3 times per day. Remains on Reglan. On omeprazole as well.     Current Outpatient Prescriptions  Medication Sig Dispense Refill  . albuterol (PROVENTIL HFA;VENTOLIN HFA) 108 (90 Base) MCG/ACT inhaler Take 2 puffs 3 times a day and every 4 hours as needed.    Marland Kitchen allopurinol (ZYLOPRIM) 300 MG tablet Take 300 mg by mouth daily.    Marland Kitchen  amitriptyline (ELAVIL) 10 MG tablet Take 20 mg by mouth at bedtime.     Marland Kitchen atorvastatin (LIPITOR) 80 MG tablet Take 80 mg by mouth daily.    Marland Kitchen diltiazem (CARDIZEM CD) 120 MG 24 hr capsule Take 1 capsule (120 mg total) by mouth daily. New add on blood pressure medicine. 30 capsule 3  . docusate sodium (COLACE) 100 MG capsule Take 1 capsule (100 mg total) by mouth every 12 (twelve) hours. 60 capsule 1  . fexofenadine (ALLEGRA) 180 MG tablet Take 180 mg by mouth daily.      . fluticasone (FLONASE) 50 MCG/ACT nasal spray Place 2 sprays into both nostrils daily.    . furosemide (LASIX) 20 MG tablet Take 20 mg by mouth daily.    . hydroxychloroquine (PLAQUENIL) 200 MG tablet Take 400 mg by mouth daily.    . iron polysaccharides (NIFEREX) 150 MG capsule Take 150 mg by mouth daily.    Marland Kitchen leflunomide (ARAVA) 20 MG tablet Take 20 mg by mouth daily.    . methimazole (TAPAZOLE) 10 MG tablet Take 1 tablet (10 mg total) by mouth daily. For treatment of your thyroid disorder. 30 tablet 3  . metoCLOPramide (REGLAN) 5 MG tablet Take 10 mg by mouth at bedtime.     . metoprolol tartrate (LOPRESSOR) 50 MG tablet Take 1 tablet (50 mg total) by mouth 2 (two) times daily. 60 tablet 2  . mometasone-formoterol (DULERA) 100-5 MCG/ACT AERO Inhale 2 puffs into the lungs daily as needed for wheezing or shortness of breath.     . montelukast (SINGULAIR) 10 MG tablet Take 10 mg by mouth at bedtime.    Marland Kitchen  Multiple Vitamins-Minerals (MACULAR VITAMIN BENEFIT PO) Take 2 tablets by mouth daily.    . Omega-3 Fatty Acids (FISH OIL) 1000 MG CAPS Take 1,000 mg by mouth 3 (three) times daily with meals.    Marland Kitchen omeprazole (PRILOSEC) 20 MG capsule Take 20 mg by mouth 2 (two) times daily before a meal.    . polyethylene glycol (MIRALAX) packet Take 17 g by mouth daily. 60 each 1  . sucralfate (CARAFATE) 1 GM/10ML suspension Take 10 mLs (1 g total) by mouth 4 (four) times daily. 420 mL 1  . Tapentadol HCl (NUCYNTA) 75 MG TABS Take 75 mg by mouth  every 8 (eight) hours as needed (for pain).    . triamcinolone cream (KENALOG) 0.1 % Apply 1 application topically daily as needed (for irritation).     . nitroGLYCERIN (NITROSTAT) 0.4 MG SL tablet Place 0.4 mg under the tongue every 5 (five) minutes as needed for chest pain. Reported on 03/07/2016    . Polyethyl Glycol-Propyl Glycol (SYSTANE OP) Apply 1 drop to eye 3 (three) times daily as needed (Dry Eyes).    . [DISCONTINUED] dexlansoprazole (DEXILANT) 60 MG capsule Take 1 capsule (60 mg total) by mouth daily. (Patient not taking: Reported on 01/08/2016) 30 capsule 5  . [DISCONTINUED] Linaclotide (LINZESS) 290 MCG CAPS capsule Take 1 capsule (290 mcg total) by mouth daily. (Patient not taking: Reported on 01/08/2016) 90 capsule 1   No current facility-administered medications for this visit.    Allergies as of 03/07/2016 - Review Complete 03/07/2016  Allergen Reaction Noted  . Biaxin [clarithromycin] Rash and Other (See Comments) 02/02/2010  . Penicillins Rash and Other (See Comments) 02/02/2010    ROS:  General: Negative for fever, chills, fatigue. See history of present illness  ENT: Negative for hoarseness, difficulty swallowing , nasal congestion. CV: Negative for chest pain, angina, palpitations, dyspnea on exertion, peripheral edema.  Respiratory: Negative for dyspnea at rest, dyspnea on exertion, cough, sputum, wheezing.  GI: See history of present illness. GU:  Negative for dysuria, hematuria, urinary incontinence, urinary frequency, nocturnal urination.  Endo: See history of present illness    Physical Examination:   BP 125/73 mmHg  Pulse 94  Temp(Src) 97.7 F (36.5 C) (Oral)  Ht 5' 4.5" (1.638 m)  Wt 134 lb 6.4 oz (60.963 kg)  BMI 22.72 kg/m2  General: Well-nourished, well-developed in no acute distress. Accompanied by husband Eyes: No icterus. Mouth: Oropharyngeal mucosa moist and pink , no lesions erythema or exudate. Lungs: Clear to auscultation bilaterally.    Heart: Regular rate and rhythm, no murmurs rubs or gallops.  Abdomen: Bowel sounds are normal, mild to moderate tenderness throughout the abdomen with palpation, increased the epigastrium, nondistended, no hepatosplenomegaly or masses, no abdominal bruits or hernia , no rebound or guarding.   Extremities: No lower extremity edema. No clubbing or deformities. Neuro: Alert and oriented x 4   Skin: Warm and dry, no jaundice.   Psych: Alert and cooperative, normal mood and affect.  Labs:  Lab Results  Component Value Date   CREATININE 0.53 01/24/2016   BUN 20 01/24/2016   NA 137 01/24/2016   K 3.9 01/24/2016   CL 101 01/24/2016   CO2 27 01/24/2016   Lab Results  Component Value Date   ALT 23 01/18/2016   AST 24 01/18/2016   ALKPHOS 99 01/18/2016   BILITOT 0.6 01/18/2016   Lab Results  Component Value Date   WBC 10.3 01/24/2016   HGB 10.1* 01/24/2016   HCT 30.2*  01/24/2016   MCV 92.9 01/24/2016   PLT 364 01/24/2016   Lab Results  Component Value Date   IRON 20* 01/19/2016   TIBC 225* 01/19/2016   FERRITIN 1003* 01/19/2016    Imaging Studies: Dg Chest 2 View  02/07/2016  CLINICAL DATA:  Recent pneumonia . EXAM: CHEST  2 VIEW COMPARISON:  01/21/2016 . FINDINGS: Mediastinum hilar structures are normal. Interim near complete clearing of bibasilar infiltrates. Prior CABG. Heart size normal. No pleural effusion or pneumothorax. Surgical clips right upper quadrant. Prior lumbar spine fusion . IMPRESSION: Interim near complete clearing of bibasilar pulmonary infiltrates. Electronically Signed   By: Marcello Moores  Register   On: 02/07/2016 15:46   Mr Cervical Spine Wo Contrast  02/19/2016  CLINICAL DATA:  Severe back pain radiating to the extremities. EXAM: MRI CERVICAL AND thoracic SPINE WITHOUT CONTRAST TECHNIQUE: Multiplanar and multiecho pulse sequences of the cervical spine, to include the craniocervical junction and cervicothoracic junction, and thoracic spine, were obtained without  intravenous contrast. COMPARISON:  05/21/2013 FINDINGS: MRI CERVICAL SPINE FINDINGS Foramen magnum is widely patent.  C1-2 and C2-3 are normal. C3-4: Chronic disc degeneration with loss of height. No canal or foraminal stenosis. C4-5: Retrolisthesis of 1 mm. Chronic disc degeneration with endplate osteophytes and bulging of the disc. Narrowing of the subarachnoid space surrounding the cord but no cord compression. Foraminal stenosis bilaterally that could affect either or both C5 nerve roots. C5-6: Spondylosis with endplate osteophytes and bulging of the disc. Narrowing of the ventral subarachnoid space but no compression of the cord. Foraminal stenosis bilaterally that could affect either or both C6 nerve roots. C6-7: Spondylosis with endplate osteophytes and bulging of the disc. Mild narrowing of the subarachnoid space but no compressive effect upon the cord. Mild foraminal narrowing bilaterally without visible neural compression. C7-T1:  No disc pathology.  Minimal facet hypertrophy.  No stenosis. No abnormal cord signal. Mild generalized worsening since the study of 05/21/2013 but without dominant change. MRI thoracic SPINE FINDINGS Thoracic curvature convex to the right. Other than the curvature, there is no significant finding at T10 or above. The intervertebral discs do not show any bulge or herniation. There is no stenosis of the canal or foramina. No focal osseous finding. The T10-11 disc bulges mildly but there is no stenosis. There is an old healed superior endplate compression deformity at T11 with loss of height anteriorly of 1/3. No residual edema. Mild facet hypertrophy at this level. T11-12 and T12-L1 appear normal. No abnormal cord signal. IMPRESSION: Cervical region: Degenerative spondylosis from C3-4 through C6-7. No cord compression or abnormal cord finding. Foraminal stenosis bilaterally at C4-5 and C5-6 that would have potential to affect the C5 and C6 nerve roots. Thoracic region: Curvature  convex to the right. Old healed compression fracture at T11 which looks like a benign fracture. No canal compromise or neural compression. No abnormal cord finding. Electronically Signed   By: Nelson Chimes M.D.   On: 02/19/2016 08:17   Mr Thoracic Spine Wo Contrast  02/19/2016  CLINICAL DATA:  Severe back pain radiating to the extremities. EXAM: MRI CERVICAL AND thoracic SPINE WITHOUT CONTRAST TECHNIQUE: Multiplanar and multiecho pulse sequences of the cervical spine, to include the craniocervical junction and cervicothoracic junction, and thoracic spine, were obtained without intravenous contrast. COMPARISON:  05/21/2013 FINDINGS: MRI CERVICAL SPINE FINDINGS Foramen magnum is widely patent.  C1-2 and C2-3 are normal. C3-4: Chronic disc degeneration with loss of height. No canal or foraminal stenosis. C4-5: Retrolisthesis of 1 mm. Chronic disc  degeneration with endplate osteophytes and bulging of the disc. Narrowing of the subarachnoid space surrounding the cord but no cord compression. Foraminal stenosis bilaterally that could affect either or both C5 nerve roots. C5-6: Spondylosis with endplate osteophytes and bulging of the disc. Narrowing of the ventral subarachnoid space but no compression of the cord. Foraminal stenosis bilaterally that could affect either or both C6 nerve roots. C6-7: Spondylosis with endplate osteophytes and bulging of the disc. Mild narrowing of the subarachnoid space but no compressive effect upon the cord. Mild foraminal narrowing bilaterally without visible neural compression. C7-T1:  No disc pathology.  Minimal facet hypertrophy.  No stenosis. No abnormal cord signal. Mild generalized worsening since the study of 05/21/2013 but without dominant change. MRI thoracic SPINE FINDINGS Thoracic curvature convex to the right. Other than the curvature, there is no significant finding at T10 or above. The intervertebral discs do not show any bulge or herniation. There is no stenosis of the  canal or foramina. No focal osseous finding. The T10-11 disc bulges mildly but there is no stenosis. There is an old healed superior endplate compression deformity at T11 with loss of height anteriorly of 1/3. No residual edema. Mild facet hypertrophy at this level. T11-12 and T12-L1 appear normal. No abnormal cord signal. IMPRESSION: Cervical region: Degenerative spondylosis from C3-4 through C6-7. No cord compression or abnormal cord finding. Foraminal stenosis bilaterally at C4-5 and C5-6 that would have potential to affect the C5 and C6 nerve roots. Thoracic region: Curvature convex to the right. Old healed compression fracture at T11 which looks like a benign fracture. No canal compromise or neural compression. No abnormal cord finding. Electronically Signed   By: Nelson Chimes M.D.   On: 02/19/2016 08:17   Nm Thyroid Sng Uptake W/imaging  02/23/2016  CLINICAL DATA:  Weight loss. Trembling and cold sweats. Decreased TSH. EXAM: THYROID SCAN AND UPTAKE - 24 HOURS TECHNIQUE: Following the per oral administration of I-131 sodium iodide, the patient returned at 24 hours and uptake measurements were acquired with the uptake probe centered on the neck. Thyroid imaging was performed following the intravenous administration of the Tc-66m Pertechnetate. RADIOPHARMACEUTICALS:  14.0 MicroCuries I-131 sodium iodide orally and 10.0 mCi Technetium-29m pertechnetate IV COMPARISON:  None FINDINGS: The 24 hour radioactive iodine uptake is equal to 15%. On the thyroid scan there is uniform activity within both lobes. No dominant hot or cold nodules identified. IMPRESSION: 1. Normal 24 hour radioactive iodine uptake and scan. Electronically Signed   By: Kerby Moors M.D.   On: 02/23/2016 16:01

## 2016-03-07 NOTE — Assessment & Plan Note (Signed)
70 year old female with chronic GI issues as previously outlined who presents for ongoing chronic abdominal pain, worse postprandially, ongoing abnormal weight loss. Since her last office visit, she was hospitalized with sepsis related to pneumonia and thyrotoxicosis. Her EGD showed chronic inactive gastritis but difficult to explain her symptoms on these findings. History is complicated by chronic back pain. Discussed with patient today, weight loss is very concerning. Need to consider chronic mesenteric ischemia in light of her significant atherosclerotic disease. Suspect her abdominal pain is multifactorial. Plan for CTA abdomen pelvis in the near future.

## 2016-03-10 ENCOUNTER — Ambulatory Visit (HOSPITAL_COMMUNITY)
Admission: RE | Admit: 2016-03-10 | Discharge: 2016-03-10 | Disposition: A | Discharge status: Home / Self Care | Payer: Medicare Other | Source: Ambulatory Visit | Attending: Gastroenterology | Admitting: Gastroenterology

## 2016-03-10 DIAGNOSIS — M4854XA Collapsed vertebra, not elsewhere classified, thoracic region, initial encounter for fracture: Secondary | ICD-10-CM | POA: Insufficient documentation

## 2016-03-10 DIAGNOSIS — G8929 Other chronic pain: Secondary | ICD-10-CM | POA: Insufficient documentation

## 2016-03-10 DIAGNOSIS — Z9049 Acquired absence of other specified parts of digestive tract: Secondary | ICD-10-CM | POA: Diagnosis not present

## 2016-03-10 DIAGNOSIS — R1030 Lower abdominal pain, unspecified: Secondary | ICD-10-CM | POA: Diagnosis not present

## 2016-03-10 DIAGNOSIS — Z90710 Acquired absence of both cervix and uterus: Secondary | ICD-10-CM | POA: Diagnosis not present

## 2016-03-10 DIAGNOSIS — K5730 Diverticulosis of large intestine without perforation or abscess without bleeding: Secondary | ICD-10-CM | POA: Insufficient documentation

## 2016-03-10 DIAGNOSIS — R634 Abnormal weight loss: Secondary | ICD-10-CM

## 2016-03-10 DIAGNOSIS — M5136 Other intervertebral disc degeneration, lumbar region: Secondary | ICD-10-CM | POA: Diagnosis not present

## 2016-03-10 DIAGNOSIS — I7090 Unspecified atherosclerosis: Secondary | ICD-10-CM | POA: Insufficient documentation

## 2016-03-10 DIAGNOSIS — K5710 Diverticulosis of small intestine without perforation or abscess without bleeding: Secondary | ICD-10-CM | POA: Insufficient documentation

## 2016-03-10 DIAGNOSIS — R1013 Epigastric pain: Secondary | ICD-10-CM | POA: Insufficient documentation

## 2016-03-10 LAB — POCT I-STAT CREATININE: Creatinine, Ser: 0.5 mg/dL (ref 0.44–1.00)

## 2016-03-10 MED ORDER — IOPAMIDOL (ISOVUE-370) INJECTION 76%
100.0000 mL | Freq: Once | INTRAVENOUS | Status: AC | PRN
  Administered 2016-03-10: 100 mL via INTRAVENOUS

## 2016-03-14 NOTE — Progress Notes (Signed)
Quick Note:  Please let patient know that the blood vessels going to her intestines are wide open. 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 The internal iliac arteries are more heavily disease. There is likely high-grade stenosis proximally on the left which supplies pelvic organs, buttocks, thigh, etc. (SHE NEEDS TO FOLLOW UP WITH HER PCP FOR RECOMMENDATIONS)  Nothing to explain her weight loss and upper abdominal pain. Cannot rule out some pelvic pain related to above mentioned stenosis.   Before considering tertiary referral for second opinion, I would like for her to return to see Dr. Gala Romney in the office within the next 2-3 weeks. ______

## 2016-03-24 ENCOUNTER — Encounter: Payer: Self-pay | Admitting: Internal Medicine

## 2016-03-24 ENCOUNTER — Ambulatory Visit (INDEPENDENT_AMBULATORY_CARE_PROVIDER_SITE_OTHER): Payer: Medicare Other | Admitting: Internal Medicine

## 2016-03-24 VITALS — BP 144/84 | HR 99 | Temp 97.4°F | Ht 64.5 in | Wt 136.2 lb

## 2016-03-24 DIAGNOSIS — K219 Gastro-esophageal reflux disease without esophagitis: Secondary | ICD-10-CM | POA: Diagnosis not present

## 2016-03-24 DIAGNOSIS — R1084 Generalized abdominal pain: Secondary | ICD-10-CM | POA: Diagnosis not present

## 2016-03-24 DIAGNOSIS — K5909 Other constipation: Secondary | ICD-10-CM | POA: Diagnosis not present

## 2016-03-24 NOTE — Progress Notes (Signed)
Primary Care Physician:  [name], MD Primary Gastroenterologist:  Dr. Gala Romney  Pre-Procedure History & Physical: HPI:  Katherine Larson is a 70 y.o. female here for further evaluation of the history of abdominal pain. States it strts in her epigastric area and may radiate into her suprapubic area  - worsened by eating. Gets nauseated but no vomiting. Takes Reglan at night this helps her reflux symptoms on top of omeprazole 20 mg twice daily. No dysphagia since her esophagus was dilated earlier in the year. She has had an extensive evaluation here and elsewhere as documented in our recent office notes.Recent CTA negative or mesenteric ischemia - high grade iliac lesion on the left  Significant weight loss documented in the past several months although she's actually gained 2 pounds since her last office visit.  Past Medical History  Diagnosis Date  . NSVT (nonsustained ventricular tachycardia) ([place])   . DM2 (diabetes mellitus, type 2) ([place])   . CAD (coronary artery disease)     s/p CABG 1996;  Myoview 3/11: mod partially reversible AS perfusion defect, most likely related to variable breast attenuation although an element of scar with peri-infarct ischemia also possible, EF 54% - felt to be low risk  . HTN (hypertension)   . HLD (hyperlipidemia)   . Asthma   . Neuropathy ([place])   . Complication of anesthesia   . PONV (postoperative nausea and vomiting)   . Shortness of breath     with activity  . Seasonal allergies   . GERD (gastroesophageal reflux disease)   . Constipation   . Diarrhea   . Rheumatoid arthritis ([facility])     RA  . Gout   . Anemia     anemia of chronic disease +/- IDA followed by Dr. Earlie Server. received iron infusions in the past  . Borderline glaucoma   . Hyperthyroidism 01/21/2016    Past Surgical History  Procedure Laterality Date  . Cholecystectomy    . Ptca    . Coronary artery bypass graft  1996    2 vessels  . Hernia repair      hiatal hernia  . Eye  surgery Bilateral   . Abdominal hysterectomy    . Fracture surgery Right     arm  . Maximum access (mas)posterior lumbar interbody fusion (plif) 1 level N/A 08/14/2013    Procedure:  MAXIMUM ACCESS SURGERY(MAS) POSTERIOR LUMBAR INTERBODY FUSION LUMBAR THREE-FOUR ;  Surgeon: Eustace Moore, MD;  Location: [facility];  Service: Neurosurgery;  Laterality: N/A;   MAXIMUM ACCESS SURGERY(MAS) POSTERIOR LUMBAR INTERBODY FUSION LUMBAR THREE-FOUR   . Appendectomy    . Colonoscopy  11/2011    Dr. Magod:ext/int hemorrhoids, diverticulosis sigmoid colon and distal desc colon, mid-desc colon hyperplastic polyps.   . Esophagogastroduodenoscopy  11/2011    Dr. Watt Climes: small hiatal hernia, one non-bleeding superficial gastric ulcer, medium-sized diverticulum in area of papilla  . Givens capsule study  11/2012    Dr. Watt Climes: minimal gastritis, normal small bowel capsule  . Esophagogastroduodenoscopy N/A 12/29/2015    Dr. Gala Romney: Chronic inactive gastritis, normal esophagus status post dilation, duodenal diverticula    Prior to Admission medications   Medication Sig Start Date End Date Taking? Authorizing Provider  albuterol (PROVENTIL HFA;VENTOLIN HFA) 108 (90 Base) MCG/ACT inhaler Take 2 puffs 3 times a day and every 4 hours as needed. 01/24/16  Yes Rexene Alberts, MD  allopurinol (ZYLOPRIM) 300 MG tablet Take 300 mg by mouth daily.   Yes Historical Provider,  MD  amitriptyline (ELAVIL) 10 MG tablet Take 20 mg by mouth at bedtime.    Yes Historical Provider, MD  atorvastatin (LIPITOR) 80 MG tablet Take 80 mg by mouth daily.   Yes Historical Provider, MD  diltiazem (CARDIZEM CD) 120 MG 24 hr capsule Take 1 capsule (120 mg total) by mouth daily. New add on blood pressure medicine. 01/24/16  Yes Rexene Alberts, MD  docusate sodium (COLACE) 100 MG capsule Take 1 capsule (100 mg total) by mouth every 12 (twelve) hours. 01/09/16  Yes Kristen N Ward, DO  fexofenadine (ALLEGRA) 180 MG tablet Take 180 mg by mouth daily.     Yes  Historical Provider, MD  fluticasone (FLONASE) 50 MCG/ACT nasal spray Place 2 sprays into both nostrils daily.   Yes Historical Provider, MD  HYDROcodone-acetaminophen (NORCO) 7.5-325 MG tablet Take 1 tablet by mouth every 6 (six) hours as needed for moderate pain.   Yes Historical Provider, MD  hydroxychloroquine (PLAQUENIL) 200 MG tablet Take 400 mg by mouth daily.   Yes Historical Provider, MD  iron polysaccharides (NIFEREX) 150 MG capsule Take 150 mg by mouth daily.   Yes Historical Provider, MD  leflunomide (ARAVA) 20 MG tablet Take 20 mg by mouth daily.   Yes Historical Provider, MD  methimazole (TAPAZOLE) 10 MG tablet Take 1 tablet (10 mg total) by mouth daily. For treatment of your thyroid disorder. 03/01/16  Yes Cassandria Anger, MD  metoCLOPramide (REGLAN) 5 MG tablet Take 10 mg by mouth at bedtime.    Yes Historical Provider, MD  metoprolol tartrate (LOPRESSOR) 50 MG tablet Take 1 tablet (50 mg total) by mouth 2 (two) times daily. 03/01/16  Yes Cassandria Anger, MD  montelukast (SINGULAIR) 10 MG tablet Take 10 mg by mouth at bedtime.   Yes Historical Provider, MD  Multiple Vitamins-Minerals (MACULAR VITAMIN BENEFIT PO) Take 2 tablets by mouth daily.   Yes Historical Provider, MD  Polyethyl Glycol-Propyl Glycol (SYSTANE OP) Apply 1 drop to eye 3 (three) times daily as needed (Dry Eyes).   Yes Historical Provider, MD  polyethylene glycol (MIRALAX) packet Take 17 g by mouth daily. 01/09/16  Yes Kristen N Ward, DO  sucralfate (CARAFATE) 1 GM/10ML suspension Take 10 mLs (1 g total) by mouth 4 (four) times daily. 12/22/15  Yes Mahala Menghini, PA-C  triamcinolone cream (KENALOG) 0.1 % Apply 1 application topically daily as needed (for irritation).    Yes Historical Provider, MD  furosemide (LASIX) 20 MG tablet Take 20 mg by mouth daily. Reported on 03/24/2016    Historical Provider, MD  mometasone-formoterol (DULERA) 100-5 MCG/ACT AERO Inhale 2 puffs into the lungs daily as needed for wheezing  or shortness of breath. Reported on 03/24/2016    Historical Provider, MD  nitroGLYCERIN (NITROSTAT) 0.4 MG SL tablet Place 0.4 mg under the tongue every 5 (five) minutes as needed for chest pain. Reported on 03/24/2016    Historical Provider, MD  Omega-3 Fatty Acids (FISH OIL) 1000 MG CAPS Take 1,000 mg by mouth 3 (three) times daily with meals. Reported on 03/24/2016    Historical Provider, MD  omeprazole (PRILOSEC) 20 MG capsule Take 20 mg by mouth 2 (two) times daily before a meal. Reported on 03/24/2016    Historical Provider, MD  Tapentadol HCl (NUCYNTA) 75 MG TABS Take 75 mg by mouth every 8 (eight) hours as needed (for pain). Reported on 03/24/2016    Historical Provider, MD    Allergies as of 03/24/2016 - Review Complete 03/24/2016  Allergen  Reaction Noted  . Biaxin [clarithromycin] Rash and Other (See Comments) 02/02/2010  . Penicillins Rash and Other (See Comments) 02/02/2010    Family History  Problem Relation Age of Onset  . Heart attack Father   . Heart attack Mother   . Colon cancer Neg Hx   . Bladder Cancer Brother   . Cervical cancer Daughter     ?    Social History   Social History  . Marital Status: Married    Spouse Name: N/A  . Number of Children: 3  . Years of Education: N/A   Occupational History  . Not on file.   Social History Main Topics  . Smoking status: Former Smoker -- 15 years    Quit date: 11/20/1994  . Smokeless tobacco: Not on file     Comment: Quit in 1996  . Alcohol Use: No  . Drug Use: No  . Sexual Activity: Not on file   Other Topics Concern  . Not on file   Social History Narrative   2 living children, one deceased age 81       Review of Systems: See HPI, otherwise negative ROS  Physical Exam: BP 144/84 mmHg  Pulse 99  Temp(Src) 97.4 F (36.3 C) (Oral)  Ht 5' 4.5" (1.638 m)  Wt 136 lb 3.2 oz (61.78 kg)  BMI 23.03 kg/m2 General:   Alert,  Well-developed, well-nourished, pleasant and cooperative in NAD.  Accompanied by her  husband Skin:  Intact without significant lesions or rashes. Neck:  Supple; no masses or thyromegaly. No significant cervical adenopathy. Lungs:  Clear throughout to auscultation.   No wheezes, crackles, or rhonchi. No acute distress. Heart:  Regular rate and rhythm; no murmurs, clicks, rubs,  or gallops. Abdomen: Non-distended, normal bowel sounds.  Soft and nontender without appreciable mass or hepatosplenomegaly.  Pulses:  Normal pulses noted. Extremities:  Without clubbing or edema.  Impression: Pleasant 70 year old lady with generalized postprandial abdominal pain in the setting of GERD and constipation along with significant, well documented, weight loss. CT angiogram did not demonstrate any significant lesions which would suggest mesenteric ischemia. She and her husband are frustrated as to the cause of her symptoms. Recent thyrotoxicosis has resolved.  I do suspect a musculoskeletal/neuropathic component. I doubt we are missing a significant occult underlying disease process.  Recommendations:  Continue omeprazole for now.  Warned about side effects of Reglan  Continue Colace.  Follow-up with PCP regarding Left iliac lesion  As discussed, she has had an extensive evaluation  of her abdominal pain without a cause being found. I feel it would be beneficial for her to see a gastroenterologist at [facility] for second opinion. This will help [place] manage symptoms locally.  After seen at [facility], we will schedule a follow-up appointment here      Notice: This dictation was prepared with Dragon dictation along with smaller phrase technology. Any transcriptional errors that result from this process are unintentional and may not be corrected upon review.

## 2016-03-24 NOTE — Patient Instructions (Signed)
Please continue omeprazole for now.  Continue Colace.  As discussed, you have had an extensive evaluation  of your abdominal pain without a cause being found. I feel it would be beneficial for you to see a gastroenterologist at [facility] for second opinion. This will help [place] manage your symptoms locally.  After you been seen at [facility], we will schedule a follow-up appointment here

## 2016-03-27 ENCOUNTER — Other Ambulatory Visit: Payer: Self-pay

## 2016-03-27 DIAGNOSIS — R109 Unspecified abdominal pain: Secondary | ICD-10-CM

## 2016-03-27 DIAGNOSIS — Z7689 Persons encountering health services in other specified circumstances: Secondary | ICD-10-CM

## 2016-03-28 ENCOUNTER — Telehealth: Payer: Self-pay | Admitting: Internal Medicine

## 2016-03-28 NOTE — Telephone Encounter (Signed)
PATIENT HUSBAND, WILLIAM, CALLED INQUIRING ON THE REFERRAL TO [facility] WE WERE TO DO FOR HER.  [phone]

## 2016-03-28 NOTE — Telephone Encounter (Signed)
Pt is aware of appt at [facility] on 06/08 with Dr. Steward Drone

## 2016-04-29 ENCOUNTER — Emergency Department (HOSPITAL_COMMUNITY): Payer: Medicare Other

## 2016-04-29 ENCOUNTER — Encounter (HOSPITAL_COMMUNITY): Payer: Self-pay | Admitting: *Deleted

## 2016-04-29 ENCOUNTER — Inpatient Hospital Stay
Admission: AD | Admit: 2016-04-29 | Payer: Self-pay | Source: Other Acute Inpatient Hospital | Admitting: Cardiovascular Disease

## 2016-04-29 ENCOUNTER — Inpatient Hospital Stay (HOSPITAL_COMMUNITY)
Admission: EM | Admit: 2016-04-29 | Discharge: 2016-05-03 | DRG: 286 | Disposition: A | Discharge status: Home / Self Care | Payer: Medicare Other | Attending: Cardiovascular Disease | Admitting: Cardiovascular Disease

## 2016-04-29 DIAGNOSIS — M0530 Rheumatoid heart disease with rheumatoid arthritis of unspecified site: Secondary | ICD-10-CM | POA: Diagnosis present

## 2016-04-29 DIAGNOSIS — K2950 Unspecified chronic gastritis without bleeding: Secondary | ICD-10-CM | POA: Diagnosis present

## 2016-04-29 DIAGNOSIS — E1140 Type 2 diabetes mellitus with diabetic neuropathy, unspecified: Secondary | ICD-10-CM | POA: Diagnosis present

## 2016-04-29 DIAGNOSIS — I5041 Acute combined systolic (congestive) and diastolic (congestive) heart failure: Secondary | ICD-10-CM | POA: Diagnosis present

## 2016-04-29 DIAGNOSIS — J810 Acute pulmonary edema: Secondary | ICD-10-CM | POA: Diagnosis not present

## 2016-04-29 DIAGNOSIS — D638 Anemia in other chronic diseases classified elsewhere: Secondary | ICD-10-CM | POA: Diagnosis present

## 2016-04-29 DIAGNOSIS — M069 Rheumatoid arthritis, unspecified: Secondary | ICD-10-CM | POA: Diagnosis present

## 2016-04-29 DIAGNOSIS — E876 Hypokalemia: Secondary | ICD-10-CM | POA: Diagnosis present

## 2016-04-29 DIAGNOSIS — H409 Unspecified glaucoma: Secondary | ICD-10-CM | POA: Diagnosis present

## 2016-04-29 DIAGNOSIS — Z951 Presence of aortocoronary bypass graft: Secondary | ICD-10-CM

## 2016-04-29 DIAGNOSIS — Z87891 Personal history of nicotine dependence: Secondary | ICD-10-CM

## 2016-04-29 DIAGNOSIS — I2510 Atherosclerotic heart disease of native coronary artery without angina pectoris: Secondary | ICD-10-CM | POA: Diagnosis present

## 2016-04-29 DIAGNOSIS — I447 Left bundle-branch block, unspecified: Secondary | ICD-10-CM | POA: Diagnosis present

## 2016-04-29 DIAGNOSIS — E0590 Thyrotoxicosis, unspecified without thyrotoxic crisis or storm: Secondary | ICD-10-CM | POA: Diagnosis present

## 2016-04-29 DIAGNOSIS — Z79899 Other long term (current) drug therapy: Secondary | ICD-10-CM

## 2016-04-29 DIAGNOSIS — Z7951 Long term (current) use of inhaled steroids: Secondary | ICD-10-CM | POA: Diagnosis not present

## 2016-04-29 DIAGNOSIS — E119 Type 2 diabetes mellitus without complications: Secondary | ICD-10-CM

## 2016-04-29 DIAGNOSIS — R1013 Epigastric pain: Secondary | ICD-10-CM

## 2016-04-29 DIAGNOSIS — K219 Gastro-esophageal reflux disease without esophagitis: Secondary | ICD-10-CM | POA: Diagnosis present

## 2016-04-29 DIAGNOSIS — R0602 Shortness of breath: Secondary | ICD-10-CM | POA: Diagnosis present

## 2016-04-29 DIAGNOSIS — J302 Other seasonal allergic rhinitis: Secondary | ICD-10-CM | POA: Diagnosis present

## 2016-04-29 DIAGNOSIS — E785 Hyperlipidemia, unspecified: Secondary | ICD-10-CM | POA: Diagnosis present

## 2016-04-29 DIAGNOSIS — I25110 Atherosclerotic heart disease of native coronary artery with unstable angina pectoris: Secondary | ICD-10-CM | POA: Diagnosis not present

## 2016-04-29 DIAGNOSIS — I509 Heart failure, unspecified: Secondary | ICD-10-CM

## 2016-04-29 DIAGNOSIS — I5022 Chronic systolic (congestive) heart failure: Secondary | ICD-10-CM | POA: Diagnosis present

## 2016-04-29 DIAGNOSIS — N189 Chronic kidney disease, unspecified: Secondary | ICD-10-CM | POA: Diagnosis present

## 2016-04-29 DIAGNOSIS — R0600 Dyspnea, unspecified: Secondary | ICD-10-CM

## 2016-04-29 DIAGNOSIS — G8929 Other chronic pain: Secondary | ICD-10-CM | POA: Diagnosis present

## 2016-04-29 DIAGNOSIS — I10 Essential (primary) hypertension: Secondary | ICD-10-CM | POA: Diagnosis present

## 2016-04-29 DIAGNOSIS — I011 Acute rheumatic endocarditis: Secondary | ICD-10-CM | POA: Diagnosis present

## 2016-04-29 DIAGNOSIS — I471 Supraventricular tachycardia: Secondary | ICD-10-CM | POA: Diagnosis present

## 2016-04-29 DIAGNOSIS — E1122 Type 2 diabetes mellitus with diabetic chronic kidney disease: Secondary | ICD-10-CM | POA: Diagnosis present

## 2016-04-29 DIAGNOSIS — I249 Acute ischemic heart disease, unspecified: Secondary | ICD-10-CM | POA: Diagnosis not present

## 2016-04-29 DIAGNOSIS — I5042 Chronic combined systolic (congestive) and diastolic (congestive) heart failure: Secondary | ICD-10-CM | POA: Diagnosis present

## 2016-04-29 DIAGNOSIS — I428 Other cardiomyopathies: Secondary | ICD-10-CM | POA: Diagnosis present

## 2016-04-29 DIAGNOSIS — I130 Hypertensive heart and chronic kidney disease with heart failure and stage 1 through stage 4 chronic kidney disease, or unspecified chronic kidney disease: Secondary | ICD-10-CM | POA: Diagnosis present

## 2016-04-29 DIAGNOSIS — I5021 Acute systolic (congestive) heart failure: Secondary | ICD-10-CM | POA: Diagnosis not present

## 2016-04-29 DIAGNOSIS — Z981 Arthrodesis status: Secondary | ICD-10-CM | POA: Diagnosis not present

## 2016-04-29 LAB — CBC WITH DIFFERENTIAL/PLATELET
Basophils Absolute: 0 10*3/uL (ref 0.0–0.1)
Basophils Relative: 0 %
Eosinophils Absolute: 0.1 10*3/uL (ref 0.0–0.7)
Eosinophils Relative: 2 %
HCT: 34.6 % — ABNORMAL LOW (ref 36.0–46.0)
Hemoglobin: 11.7 g/dL — ABNORMAL LOW (ref 12.0–15.0)
Lymphocytes Relative: 23 %
Lymphs Abs: 1.8 10*3/uL (ref 0.7–4.0)
MCH: 31.5 pg (ref 26.0–34.0)
MCHC: 33.8 g/dL (ref 30.0–36.0)
MCV: 93.3 fL (ref 78.0–100.0)
Monocytes Absolute: 0.8 10*3/uL (ref 0.1–1.0)
Monocytes Relative: 10 %
Neutro Abs: 5.2 10*3/uL (ref 1.7–7.7)
Neutrophils Relative %: 65 %
Platelets: 261 10*3/uL (ref 150–400)
RBC: 3.71 MIL/uL — ABNORMAL LOW (ref 3.87–5.11)
RDW: 13.8 % (ref 11.5–15.5)
WBC: 8 10*3/uL (ref 4.0–10.5)

## 2016-04-29 LAB — COMPREHENSIVE METABOLIC PANEL
ALT: 13 U/L — ABNORMAL LOW (ref 14–54)
AST: 18 U/L (ref 15–41)
Albumin: 3.2 g/dL — ABNORMAL LOW (ref 3.5–5.0)
Alkaline Phosphatase: 76 U/L (ref 38–126)
Anion gap: 10 (ref 5–15)
BUN: 11 mg/dL (ref 6–20)
CO2: 23 mmol/L (ref 22–32)
Calcium: 8.3 mg/dL — ABNORMAL LOW (ref 8.9–10.3)
Chloride: 109 mmol/L (ref 101–111)
Creatinine, Ser: 0.41 mg/dL — ABNORMAL LOW (ref 0.44–1.00)
GFR calc Af Amer: >60 mL/min (ref >60)
GFR calc non Af Amer: >60 mL/min (ref >60)
Glucose, Bld: 147 mg/dL — ABNORMAL HIGH (ref 65–99)
Potassium: 2.8 mmol/L — ABNORMAL LOW (ref 3.5–5.1)
Sodium: 142 mmol/L (ref 135–145)
Total Bilirubin: 0.4 mg/dL (ref 0.3–1.2)
Total Protein: 6.4 g/dL — ABNORMAL LOW (ref 6.5–8.1)

## 2016-04-29 LAB — CBC
HCT: 34 % — ABNORMAL LOW (ref 36.0–46.0)
Hemoglobin: 11.2 g/dL — ABNORMAL LOW (ref 12.0–15.0)
MCH: 30.4 pg (ref 26.0–34.0)
MCHC: 32.9 g/dL (ref 30.0–36.0)
MCV: 92.4 fL (ref 78.0–100.0)
Platelets: 229 10*3/uL (ref 150–400)
RBC: 3.68 MIL/uL — ABNORMAL LOW (ref 3.87–5.11)
RDW: 13.6 % (ref 11.5–15.5)
WBC: 6.5 10*3/uL (ref 4.0–10.5)

## 2016-04-29 LAB — GLUCOSE, CAPILLARY
Glucose-Capillary: 103 mg/dL — ABNORMAL HIGH (ref 65–99)
Glucose-Capillary: 94 mg/dL (ref 65–99)
Glucose-Capillary: 119 mg/dL — ABNORMAL HIGH (ref 65–99)

## 2016-04-29 LAB — URINALYSIS, ROUTINE W REFLEX MICROSCOPIC
Bilirubin Urine: NEGATIVE
Glucose, UA: NEGATIVE mg/dL
Hgb urine dipstick: NEGATIVE
Ketones, ur: NEGATIVE mg/dL
Leukocytes, UA: NEGATIVE
Nitrite: NEGATIVE
Protein, ur: NEGATIVE mg/dL
Specific Gravity, Urine: 1.01 (ref 1.005–1.030)
pH: 5 (ref 5.0–8.0)

## 2016-04-29 LAB — D-DIMER, QUANTITATIVE (NOT AT ARMC): D-Dimer, Quant: 0.61 ug/mL-FEU — ABNORMAL HIGH (ref 0.00–0.50)

## 2016-04-29 LAB — TROPONIN I: Troponin I: <0.03 ng/mL (ref <0.031)

## 2016-04-29 LAB — BRAIN NATRIURETIC PEPTIDE: B Natriuretic Peptide: 1552 pg/mL — ABNORMAL HIGH (ref 0.0–100.0)

## 2016-04-29 LAB — T4, FREE: Free T4: 1.01 ng/dL (ref 0.61–1.12)

## 2016-04-29 LAB — CREATININE, SERUM
Creatinine, Ser: 0.61 mg/dL (ref 0.44–1.00)
GFR calc Af Amer: >60 mL/min (ref >60)
GFR calc non Af Amer: >60 mL/min (ref >60)

## 2016-04-29 LAB — TSH: TSH: 1.793 u[IU]/mL (ref 0.350–4.500)

## 2016-04-29 MED ORDER — PANTOPRAZOLE SODIUM 40 MG PO TBEC
40.0000 mg | DELAYED_RELEASE_TABLET | Freq: Every day | ORAL | Status: DC
  Administered 2016-04-29 – 2016-05-03 (×5): 40 mg via ORAL
  Filled 2016-04-29 (×5): qty 1

## 2016-04-29 MED ORDER — POTASSIUM CHLORIDE 20 MEQ/15ML (10%) PO SOLN
10.0000 meq | Freq: Once | ORAL | Status: AC
  Administered 2016-04-29: 10 meq via ORAL
  Filled 2016-04-29: qty 30

## 2016-04-29 MED ORDER — ALLOPURINOL 300 MG PO TABS
300.0000 mg | ORAL_TABLET | Freq: Every day | ORAL | Status: DC
  Administered 2016-04-29 – 2016-05-03 (×4): 300 mg via ORAL
  Filled 2016-04-29 (×3): qty 1
  Filled 2016-04-29: qty 3
  Filled 2016-04-29: qty 1

## 2016-04-29 MED ORDER — HYDROCODONE-ACETAMINOPHEN 7.5-325 MG PO TABS
1.0000 | ORAL_TABLET | Freq: Four times a day (QID) | ORAL | Status: DC | PRN
  Administered 2016-04-29 – 2016-05-03 (×13): 1 via ORAL
  Filled 2016-04-29 (×14): qty 1

## 2016-04-29 MED ORDER — NITROGLYCERIN IN D5W 200-5 MCG/ML-% IV SOLN
0.0000 ug/min | INTRAVENOUS | Status: DC
  Filled 2016-04-29: qty 250

## 2016-04-29 MED ORDER — ONDANSETRON HCL 4 MG/2ML IJ SOLN
4.0000 mg | Freq: Once | INTRAMUSCULAR | Status: AC
  Administered 2016-04-29: 4 mg via INTRAVENOUS

## 2016-04-29 MED ORDER — HYDROXYCHLOROQUINE SULFATE 200 MG PO TABS
400.0000 mg | ORAL_TABLET | Freq: Every day | ORAL | Status: DC
  Administered 2016-04-29 – 2016-05-03 (×5): 400 mg via ORAL
  Filled 2016-04-29 (×5): qty 2

## 2016-04-29 MED ORDER — ACETAMINOPHEN 325 MG PO TABS
650.0000 mg | ORAL_TABLET | ORAL | Status: DC | PRN
  Filled 2016-04-29: qty 2

## 2016-04-29 MED ORDER — SUCRALFATE 1 GM/10ML PO SUSP
1.0000 g | Freq: Four times a day (QID) | ORAL | Status: DC
  Administered 2016-04-29 – 2016-05-03 (×12): 1 g via ORAL
  Filled 2016-04-29 (×12): qty 10

## 2016-04-29 MED ORDER — DILTIAZEM HCL ER COATED BEADS 120 MG PO CP24
120.0000 mg | ORAL_CAPSULE | Freq: Every day | ORAL | Status: DC
  Administered 2016-04-29 – 2016-05-02 (×4): 120 mg via ORAL
  Filled 2016-04-29 (×4): qty 1

## 2016-04-29 MED ORDER — ALBUTEROL SULFATE (2.5 MG/3ML) 0.083% IN NEBU: 2.5000 mg | INHALATION_SOLUTION | Freq: Four times a day (QID) | RESPIRATORY_TRACT | Status: DC | PRN

## 2016-04-29 MED ORDER — LORATADINE 10 MG PO TABS
10.0000 mg | ORAL_TABLET | Freq: Every day | ORAL | Status: DC
  Administered 2016-04-29 – 2016-05-03 (×5): 10 mg via ORAL
  Filled 2016-04-29 (×5): qty 1

## 2016-04-29 MED ORDER — SODIUM CHLORIDE 0.9 % IV SOLN: 250.0000 mL | INTRAVENOUS | Status: DC | PRN

## 2016-04-29 MED ORDER — POTASSIUM CHLORIDE CRYS ER 20 MEQ PO TBCR
40.0000 meq | EXTENDED_RELEASE_TABLET | Freq: Once | ORAL | Status: AC
  Administered 2016-04-29: 40 meq via ORAL
  Filled 2016-04-29: qty 2

## 2016-04-29 MED ORDER — POLYETHYL GLYCOL-PROPYL GLYCOL 0.4-0.3 % OP GEL
Freq: Three times a day (TID) | OPHTHALMIC | Status: DC | PRN
  Filled 2016-04-29: qty 10

## 2016-04-29 MED ORDER — POTASSIUM CHLORIDE CRYS ER 20 MEQ PO TBCR
20.0000 meq | EXTENDED_RELEASE_TABLET | Freq: Three times a day (TID) | ORAL | Status: DC
  Administered 2016-04-29 – 2016-05-03 (×12): 20 meq via ORAL
  Filled 2016-04-29 (×11): qty 1

## 2016-04-29 MED ORDER — METOPROLOL TARTRATE 50 MG PO TABS
50.0000 mg | ORAL_TABLET | Freq: Two times a day (BID) | ORAL | Status: DC
Start: 2016-04-29 — End: 2016-05-02
  Administered 2016-04-29 – 2016-05-02 (×6): 50 mg via ORAL
  Filled 2016-04-29 (×6): qty 1

## 2016-04-29 MED ORDER — ASPIRIN EC 81 MG PO TBEC
81.0000 mg | DELAYED_RELEASE_TABLET | Freq: Every day | ORAL | Status: DC
  Administered 2016-04-30 – 2016-05-03 (×4): 81 mg via ORAL
  Filled 2016-04-29 (×4): qty 1

## 2016-04-29 MED ORDER — AMITRIPTYLINE HCL 10 MG PO TABS
20.0000 mg | ORAL_TABLET | Freq: Every day | ORAL | Status: DC
  Administered 2016-04-29 – 2016-05-02 (×4): 20 mg via ORAL
  Filled 2016-04-29 (×4): qty 2

## 2016-04-29 MED ORDER — POLYVINYL ALCOHOL 1.4 % OP SOLN
1.0000 [drp] | OPHTHALMIC | Status: DC | PRN
  Administered 2016-04-30: 1 [drp] via OPHTHALMIC
  Filled 2016-04-29: qty 15

## 2016-04-29 MED ORDER — METHIMAZOLE 10 MG PO TABS
10.0000 mg | ORAL_TABLET | Freq: Every day | ORAL | Status: DC
  Administered 2016-04-29 – 2016-05-03 (×5): 10 mg via ORAL
  Filled 2016-04-29 (×5): qty 1

## 2016-04-29 MED ORDER — HYDROCODONE-ACETAMINOPHEN 7.5-325 MG PO TABS
1.0000 | ORAL_TABLET | Freq: Four times a day (QID) | ORAL | Status: DC | PRN
  Administered 2016-04-29: 1 via ORAL
  Filled 2016-04-29: qty 1

## 2016-04-29 MED ORDER — TRIAMCINOLONE ACETONIDE 0.1 % EX CREA
1.0000 "application " | TOPICAL_CREAM | Freq: Every day | CUTANEOUS | Status: DC | PRN
  Filled 2016-04-29: qty 15

## 2016-04-29 MED ORDER — LEFLUNOMIDE 20 MG PO TABS
20.0000 mg | ORAL_TABLET | Freq: Every day | ORAL | Status: DC
  Administered 2016-04-29 – 2016-05-03 (×5): 20 mg via ORAL
  Filled 2016-04-29 (×5): qty 1

## 2016-04-29 MED ORDER — MONTELUKAST SODIUM 10 MG PO TABS
10.0000 mg | ORAL_TABLET | Freq: Every day | ORAL | Status: DC
  Administered 2016-04-29 – 2016-05-02 (×4): 10 mg via ORAL
  Filled 2016-04-29 (×4): qty 1

## 2016-04-29 MED ORDER — DOCUSATE SODIUM 100 MG PO CAPS
100.0000 mg | ORAL_CAPSULE | Freq: Two times a day (BID) | ORAL | Status: DC
  Administered 2016-04-29 – 2016-05-03 (×9): 100 mg via ORAL
  Filled 2016-04-29 (×9): qty 1

## 2016-04-29 MED ORDER — ATORVASTATIN CALCIUM 80 MG PO TABS
80.0000 mg | ORAL_TABLET | Freq: Every day | ORAL | Status: DC
  Administered 2016-04-29 – 2016-05-03 (×5): 80 mg via ORAL
  Filled 2016-04-29 (×5): qty 1

## 2016-04-29 MED ORDER — METOCLOPRAMIDE HCL 10 MG PO TABS
10.0000 mg | ORAL_TABLET | Freq: Every day | ORAL | Status: DC
  Administered 2016-04-29 – 2016-05-02 (×4): 10 mg via ORAL
  Filled 2016-04-29 (×4): qty 1

## 2016-04-29 MED ORDER — ONDANSETRON HCL 4 MG/2ML IJ SOLN
INTRAMUSCULAR | Status: AC
  Administered 2016-04-29: 4 mg via INTRAVENOUS
  Filled 2016-04-29: qty 2

## 2016-04-29 MED ORDER — SODIUM CHLORIDE 0.9% FLUSH
3.0000 mL | Freq: Two times a day (BID) | INTRAVENOUS | Status: DC
  Administered 2016-04-29 – 2016-05-03 (×7): 3 mL via INTRAVENOUS

## 2016-04-29 MED ORDER — FLUTICASONE PROPIONATE 50 MCG/ACT NA SUSP
2.0000 | Freq: Every day | NASAL | Status: DC
  Administered 2016-04-29 – 2016-05-03 (×5): 2 via NASAL
  Filled 2016-04-29: qty 16

## 2016-04-29 MED ORDER — FUROSEMIDE 10 MG/ML IJ SOLN
60.0000 mg | Freq: Every day | INTRAMUSCULAR | Status: DC
  Administered 2016-04-29 – 2016-05-02 (×4): 60 mg via INTRAVENOUS
  Filled 2016-04-29 (×4): qty 6

## 2016-04-29 MED ORDER — NITROGLYCERIN 0.4 MG SL SUBL: 0.4000 mg | SUBLINGUAL_TABLET | SUBLINGUAL | Status: DC | PRN

## 2016-04-29 MED ORDER — SODIUM CHLORIDE 0.9% FLUSH: 3.0000 mL | INTRAVENOUS | Status: DC | PRN

## 2016-04-29 MED ORDER — POLYSACCHARIDE IRON COMPLEX 150 MG PO CAPS
150.0000 mg | ORAL_CAPSULE | Freq: Every day | ORAL | Status: DC
  Administered 2016-04-29 – 2016-05-03 (×5): 150 mg via ORAL
  Filled 2016-04-29 (×5): qty 1

## 2016-04-29 MED ORDER — FUROSEMIDE 10 MG/ML IJ SOLN
60.0000 mg | Freq: Once | INTRAMUSCULAR | Status: AC
  Administered 2016-04-29: 60 mg via INTRAVENOUS
  Filled 2016-04-29: qty 6

## 2016-04-29 MED ORDER — ONDANSETRON HCL 4 MG/2ML IJ SOLN: 4.0000 mg | Freq: Four times a day (QID) | INTRAMUSCULAR | Status: DC | PRN

## 2016-04-29 MED ORDER — ASPIRIN 81 MG PO CHEW
324.0000 mg | CHEWABLE_TABLET | Freq: Once | ORAL | Status: AC
  Administered 2016-04-29: 324 mg via ORAL
  Filled 2016-04-29: qty 4

## 2016-04-29 MED ORDER — ENOXAPARIN SODIUM 40 MG/0.4ML ~~LOC~~ SOLN
40.0000 mg | SUBCUTANEOUS | Status: DC
  Administered 2016-04-29 – 2016-04-30 (×2): 40 mg via SUBCUTANEOUS
  Filled 2016-04-29 (×4): qty 0.4

## 2016-04-29 MED ORDER — POLYETHYLENE GLYCOL 3350 17 G PO PACK
17.0000 g | PACK | Freq: Every day | ORAL | Status: DC
  Filled 2016-04-29 (×5): qty 1

## 2016-04-29 NOTE — Consult Note (Signed)
Medical consultation in the emergency department  Katherine Larson [id number] DOB: 10-Feb-1946 DOA: 04/29/2016  PCP: [name], MD  Requesting physician:Dr. [name] Cardiologist: Dr. [name]. Has appt with Dr. [name] Cancel 6/14 Patient coming from: Home  Chief Complaint: short of breath  HPI:  70 year old woman with history of coronary artery disease, CABG, grade 1 diastolic dysfunction by echo 2014 presented with 48 hours of increasing shortness of breath, chest tightness and orthopnea. Initial evaluation revealed pulmonary edema, elevated BNP, new left bundle branch block, accelerated hypertension. Referred for acute CHF and further treatment.  Patient with history of coronary artery disease but has been asymptomatic for some time, has not taken nitroglycerin in quite some time. Over the last 48 hours she's had increasing shortness of breath, made worse especially by exertion, somewhat improved by rest. She's had new onset orthopnea, persistent/constant chest tightness which was somewhat improved with nitroglycerin this morning. Her symptoms became much worse over the last 12 hours precipitating her visit to the emergency department. She reports diaphoresis, clamminess, nausea and vomiting and some left jaw pain earlier this a.m. Currently still has some chest tightness but this is improved, shortness of breath has improved after administration of IV Lasix.  ED Course: Respiratory rate 30s, heart rate 100s, hypertensive, no hypoxia. Presented with shortness of breath, exam and x-ray suggested CHF and hypertension. Started on nitroglycerin infusion, IV Lasix and aspirin given. Pertinent labs: Troponin negative, BNP 1552, potassium 2.8, hemoglobin 11.7, d-dimer 0.61 EKG: Independently reviewed. Sinus tachycardia, left bundle branch block. Second EKG: Sinus tachycardia, left bundle branch block. Compared to previous study 01/20/2016, left bundle branch block is new. Anteroseptal MI appears  old. Imaging: independently reviewed. Chest x-ray shows pulmonary edema.  Review of Systems:  Negative for fever, new visual changes, sore throat, rash, new muscle aches, bleeding  Positive for diaphoresis, dysuria, nausea and vomiting.  Past Medical History  Diagnosis Date  . NSVT (nonsustained ventricular tachycardia) ([place])   . DM2 (diabetes mellitus, type 2) ([place])   . CAD (coronary artery disease)     s/p CABG 1996;  Myoview 3/11: mod partially reversible AS perfusion defect, most likely related to variable breast attenuation although an element of scar with peri-infarct ischemia also possible, EF 54% - felt to be low risk  . HTN (hypertension)   . HLD (hyperlipidemia)   . Asthma   . Neuropathy ([place])   . Complication of anesthesia   . PONV (postoperative nausea and vomiting)   . Shortness of breath     with activity  . Seasonal allergies   . GERD (gastroesophageal reflux disease)   . Constipation   . Diarrhea   . Rheumatoid arthritis ([facility])     RA  . Gout   . Anemia     anemia of chronic disease +/- IDA followed by Dr. Earlie Server. received iron infusions in the past  . Borderline glaucoma   . Hyperthyroidism 01/21/2016    Past Surgical History  Procedure Laterality Date  . Cholecystectomy    . Ptca    . Coronary artery bypass graft  1996    2 vessels  . Hernia repair      hiatal hernia  . Eye surgery Bilateral   . Abdominal hysterectomy    . Fracture surgery Right     arm  . Maximum access (mas)posterior lumbar interbody fusion (plif) 1 level N/A 08/14/2013    Procedure:  MAXIMUM ACCESS SURGERY(MAS) POSTERIOR LUMBAR INTERBODY FUSION LUMBAR THREE-FOUR ;  Surgeon: Eustace Moore,  MD;  Location: [facility];  Service: Neurosurgery;  Laterality: N/A;   MAXIMUM ACCESS SURGERY(MAS) POSTERIOR LUMBAR INTERBODY FUSION LUMBAR THREE-FOUR   . Appendectomy    . Colonoscopy  11/2011    Dr. Magod:ext/int hemorrhoids, diverticulosis sigmoid colon and distal desc colon, mid-desc colon  hyperplastic polyps.   . Esophagogastroduodenoscopy  11/2011    Dr. Watt Climes: small hiatal hernia, one non-bleeding superficial gastric ulcer, medium-sized diverticulum in area of papilla  . [place]ens capsule study  11/2012    Dr. Watt Climes: minimal gastritis, normal small bowel capsule  . Esophagogastroduodenoscopy N/A 12/29/2015    Dr. Gala Romney: Chronic inactive gastritis, normal esophagus status post dilation, duodenal diverticula  . Back surgery       reports that she quit smoking about 21 years ago. She does not have any smokeless tobacco history on file. She reports that she does not drink alcohol or use illicit drugs.   Allergies  Allergen Reactions  . Biaxin [Clarithromycin] Rash and Other (See Comments)    Blisters in mouth   . Penicillins Rash and Other (See Comments)    Blisters in mouth Has patient had a PCN reaction causing immediate rash, facial/tongue/throat swelling, SOB or lightheadedness with hypotension: Yesyes Has patient had a PCN reaction causing severe rash involving mucus membranes or skin necrosis: Nono Has patient had a PCN reaction that required hospitalization Nono Has patient had a PCN reaction occurring within the last 10 years: Nono If all of the above answers are "NO", then may proceed    Family History  Problem Relation Age of Onset  . Heart attack Father   . Heart attack Mother   . Colon cancer Neg Hx   . Bladder Cancer Brother   . Cervical cancer Daughter     ?     Prior to Admission medications   Medication Sig Start Date End Date Taking? Authorizing Provider  albuterol (PROVENTIL HFA;VENTOLIN HFA) 108 (90 Base) MCG/ACT inhaler Take 2 puffs 3 times a day and every 4 hours as needed. 01/24/16   Rexene Alberts, MD  allopurinol (ZYLOPRIM) 300 MG tablet Take 300 mg by mouth daily.    Historical Provider, MD  amitriptyline (ELAVIL) 10 MG tablet Take 20 mg by mouth at bedtime.     Historical Provider, MD  atorvastatin (LIPITOR) 80 MG tablet Take 80 mg by mouth  daily.    Historical Provider, MD  diltiazem (CARDIZEM CD) 120 MG 24 hr capsule Take 1 capsule (120 mg total) by mouth daily. New add on blood pressure medicine. 01/24/16   Rexene Alberts, MD  docusate sodium (COLACE) 100 MG capsule Take 1 capsule (100 mg total) by mouth every 12 (twelve) hours. 01/09/16   Kristen N Ward, DO  fexofenadine (ALLEGRA) 180 MG tablet Take 180 mg by mouth daily.      Historical Provider, MD  fluticasone (FLONASE) 50 MCG/ACT nasal spray Place 2 sprays into both nostrils daily.    Historical Provider, MD  furosemide (LASIX) 20 MG tablet Take 20 mg by mouth daily. Reported on 03/24/2016    Historical Provider, MD  HYDROcodone-acetaminophen (NORCO) 7.5-325 MG tablet Take 1 tablet by mouth every 6 (six) hours as needed for moderate pain.    Historical Provider, MD  hydroxychloroquine (PLAQUENIL) 200 MG tablet Take 400 mg by mouth daily.    Historical Provider, MD  iron polysaccharides (NIFEREX) 150 MG capsule Take 150 mg by mouth daily.    Historical Provider, MD  leflunomide (ARAVA) 20 MG tablet Take 20 mg by  mouth daily.    Historical Provider, MD  methimazole (TAPAZOLE) 10 MG tablet Take 1 tablet (10 mg total) by mouth daily. For treatment of your thyroid disorder. 03/01/16   Cassandria Anger, MD  metoCLOPramide (REGLAN) 5 MG tablet Take 10 mg by mouth at bedtime.     Historical Provider, MD  metoprolol tartrate (LOPRESSOR) 50 MG tablet Take 1 tablet (50 mg total) by mouth 2 (two) times daily. 03/01/16   Cassandria Anger, MD  mometasone-formoterol (DULERA) 100-5 MCG/ACT AERO Inhale 2 puffs into the lungs daily as needed for wheezing or shortness of breath. Reported on 03/24/2016    Historical Provider, MD  montelukast (SINGULAIR) 10 MG tablet Take 10 mg by mouth at bedtime.    Historical Provider, MD  Multiple Vitamins-Minerals (MACULAR VITAMIN BENEFIT PO) Take 2 tablets by mouth daily.    Historical Provider, MD  nitroGLYCERIN (NITROSTAT) 0.4 MG SL tablet Place 0.4 mg under  the tongue every 5 (five) minutes as needed for chest pain. Reported on 03/24/2016    Historical Provider, MD  Omega-3 Fatty Acids (FISH OIL) 1000 MG CAPS Take 1,000 mg by mouth 3 (three) times daily with meals. Reported on 03/24/2016    Historical Provider, MD  omeprazole (PRILOSEC) 20 MG capsule Take 20 mg by mouth 2 (two) times daily before a meal. Reported on 03/24/2016    Historical Provider, MD  Polyethyl Glycol-Propyl Glycol (SYSTANE OP) Apply 1 drop to eye 3 (three) times daily as needed ([place]).    Historical Provider, MD  polyethylene glycol (MIRALAX) packet Take 17 g by mouth daily. 01/09/16   Kristen N Ward, DO  sucralfate (CARAFATE) 1 GM/10ML suspension Take 10 mLs (1 g total) by mouth 4 (four) times daily. 12/22/15   Mahala Menghini, PA-C  Tapentadol HCl (NUCYNTA) 75 MG TABS Take 75 mg by mouth every 8 (eight) hours as needed (for pain). Reported on 03/24/2016    Historical Provider, MD  triamcinolone cream (KENALOG) 0.1 % Apply 1 application topically daily as needed (for irritation).     Historical Provider, MD    Physical Exam: Filed Vitals:   04/29/16 0630 04/29/16 0645 04/29/16 0700 04/29/16 0737  BP: 179/91 179/106 179/99 166/87  Pulse: 111 126 120 117  Temp:      Resp: 26 22 39 32  Height:      Weight:      SpO2: 94% 91% 92% 91%   Constitutional:  . Appears calm and comfortable Eyes:  . PERRL and irises appear normal . Normal conjunctivae and lids ENMT:  . external ears, nose appear normal . grossly normal hearing . Lips appear normal Neck:  . neck appears normal, no masses, normal ROM, supple . no thyromegaly Respiratory:  . CTA bilaterally, no w/r/r.  . Respiratory effort normal. No retractions or accessory muscle use Cardiovascular:  . RRR, no m/r/g . No LE extremity edema   Abdomen:  . Abdomen appears normal; Mild generalized tenderness. . No hernias Musculoskeletal:  . Digits/nails: no clubbing, cyanosis, petechiae, infection . RUE, LUE, RLE, LLE    o strength and tone normal, no atrophy, no abnormal movements o No tenderness, masses Skin:  . No rashes, lesions, ulcers . palpation of skin: no induration or nodules Neurologic:  . Grossly normal Psychiatric:  . judgement and insight appear normal . Mental status o Mood, affect appropriate  Wt Readings from Last 3 Encounters:  04/29/16 63.957 kg (141 lb)  03/24/16 61.78 kg (136 lb 3.2 oz)  03/07/16  60.963 kg (134 lb 6.4 oz)    I have personally reviewed following labs and imaging studies  Labs on Admission:  CBC:  Recent Labs Lab 04/29/16 0600  WBC 8.0  NEUTROABS 5.2  HGB 11.7*  HCT 34.6*  MCV 93.3  PLT 0000000   Basic Metabolic Panel:  Recent Labs Lab 04/29/16 0600  NA 142  K 2.8*  CL 109  CO2 23  GLUCOSE 147*  BUN 11  CREATININE 0.41*  CALCIUM 8.3*   Liver Function Tests:  Recent Labs Lab 04/29/16 0600  AST 18  ALT 13*  ALKPHOS 76  BILITOT 0.4  PROT 6.4*  ALBUMIN 3.2*   Cardiac Enzymes:  Recent Labs Lab 04/29/16 0600  TROPONINI <0.03   Urine analysis:    Component Value Date/Time   COLORURINE YELLOW 01/19/2016 0130   APPEARANCEUR CLEAR 01/19/2016 0130   LABSPEC 1.025 01/19/2016 0130   PHURINE 6.0 01/19/2016 0130   GLUCOSEU NEGATIVE 01/19/2016 0130   HGBUR NEGATIVE 01/19/2016 0130   BILIRUBINUR NEGATIVE 01/19/2016 0130   KETONESUR 15* 01/19/2016 0130   PROTEINUR 30* 01/19/2016 0130   NITRITE NEGATIVE 01/19/2016 0130   LEUKOCYTESUR LARGE* 01/19/2016 0130   Radiological Exams on Admission: Dg Chest 2 View  04/29/2016  CLINICAL DATA:  Acute onset of shortness of breath and upper abdominal swelling. Initial encounter. EXAM: CHEST  2 VIEW COMPARISON:  Chest radiograph performed 02/07/2016 FINDINGS: Small bilateral pleural effusions are noted. Increased interstitial markings are seen, with underlying vascular congestion, concerning for mild pulmonary edema. No pneumothorax is seen. The heart is mildly enlarged. The patient is status post  median sternotomy. No acute osseous abnormalities are identified. Postoperative change is noted at the upper abdomen, and along the lumbar spine. IMPRESSION: Small bilateral pleural effusions. Vascular congestion and mild cardiomegaly, with increased interstitial markings, concerning for mild pulmonary edema. Electronically Signed   By: Garald Balding M.D.   On: 04/29/2016 06:33    EKG: Independently reviewed. As above.  Principal Problem:   Pulmonary edema Active Problems:   Coronary atherosclerosis of native coronary artery   DM type 2 (diabetes mellitus, type 2) (HCC)   Hypokalemia   Abdominal pain, chronic, epigastric   LBBB (left bundle branch block)   Assessment/Plan 1. Acute pulmonary edema, possible ACS, suspected acute diastolic congestive heart failure with new left bundle branch block and accelerated hypertension in the context of coronary artery disease, history of CABG. Symptoms of shortness of breath, orthopnea, "chest tightness" diaphoresis. BNP 1552. Initial Troponin negative. Improved after IV Lasix. 2. New left bundle branch block 3. Elevated d-dimer. Well's criteria low probability 1.5. No history of DVT. No history to suggest VTE. D-dimer within normal limits when adjusted for age. 4. Hypokalemia. Repleted in the emergency department. 5. Diabetes mellitus type 2 with neuropathy. Stable. Anion gap within normal limits. Random blood sugar less than 200. 6. Coronary artery disease, CABG 1996, nonsustained ventricular tachycardia, hypertension, hyperlipidemia 7. Rheumatoid arthritis on Plaquenil. 8. Hyperthyroidism on Tapazole and beta blocker.   With concern for ACS, new left bundle branch block and new heart failure, discussed with on-call cardiology Dr. [name] Cancel who recommends transfer to his service at [facility].  Discussed with patient who agrees  Continue diuresis. Patient has had aspirin and is currently stable for transfer.  Other comorbidities appears stable at  this time. We'll sign off. Please contact [place] at [phone] if we can be of further assistance.  DVT prophylaxis: Lovenox Code Status: Full Family Communication: Husband at bedside Disposition Plan:  Transfer to cardiology service at [facility] called: Cardiology    Time spent: 60 minutes  Murray Hodgkins, MD  [facility] Direct contact: [phone] --[place] [place]  --[email or web address]; password [email or web address]  7PM-7AM contact night coverage as above  04/29/2016, 8:50 AM

## 2016-04-29 NOTE — ED Notes (Signed)
Pt's husband states she had the pt take two nitro tablets around 1:15am

## 2016-04-29 NOTE — ED Notes (Addendum)
Pt reports getting sob since Thursday. Pt reports feeling tight in her chest and burning (like when she had pneumonia). Pt states she also tripped over her dog earlier and hit the front of her head and nose.

## 2016-04-29 NOTE — ED Notes (Signed)
[facility] at bedside 

## 2016-04-29 NOTE — ED Notes (Signed)
Dr Goodrich at bedside

## 2016-04-29 NOTE — ED Provider Notes (Addendum)
CSN: [id number]     Arrival date & time 04/29/16  0546 History   First MD Initiated Contact with Patient 04/29/16 0555     Chief Complaint  Patient presents with  . Shortness of Breath    HPI Patient presents to the emergency department with increasing shortness breath over the past 48 hours.  She has had some cough and feels like her chest is tight and burning similar to when she had prior pneumonia.  She reports chills without documented fever.  No productive cough.  Denies unilateral leg swelling.  She does have a history of seasonal allergies for which she is on Allegra and Flonase.  Husband reports that he had the patient take 2 nitroglycerin pills at 1:15 the morning she does have a history of nonsustained ventricular tachycardia as well as a history of coronary artery disease status post CABG in 1996   Past Medical History  Diagnosis Date  . NSVT (nonsustained ventricular tachycardia) ([place])   . DM2 (diabetes mellitus, type 2) ([place])   . CAD (coronary artery disease)     s/p CABG 1996;  Myoview 3/11: mod partially reversible AS perfusion defect, most likely related to variable breast attenuation although an element of scar with peri-infarct ischemia also possible, EF 54% - felt to be low risk  . HTN (hypertension)   . HLD (hyperlipidemia)   . Asthma   . Neuropathy ([place])   . Complication of anesthesia   . PONV (postoperative nausea and vomiting)   . Shortness of breath     with activity  . Seasonal allergies   . GERD (gastroesophageal reflux disease)   . Constipation   . Diarrhea   . Rheumatoid arthritis ([facility])     RA  . Gout   . Anemia     anemia of chronic disease +/- IDA followed by Dr. Earlie Server. received iron infusions in the past  . Borderline glaucoma   . Hyperthyroidism 01/21/2016   Past Surgical History  Procedure Laterality Date  . Cholecystectomy    . Ptca    . Coronary artery bypass graft  1996    2 vessels  . Hernia repair      hiatal hernia  . Eye surgery  Bilateral   . Abdominal hysterectomy    . Fracture surgery Right     arm  . Maximum access (mas)posterior lumbar interbody fusion (plif) 1 level N/A 08/14/2013    Procedure:  MAXIMUM ACCESS SURGERY(MAS) POSTERIOR LUMBAR INTERBODY FUSION LUMBAR THREE-FOUR ;  Surgeon: Eustace Moore, MD;  Location: [facility];  Service: Neurosurgery;  Laterality: N/A;   MAXIMUM ACCESS SURGERY(MAS) POSTERIOR LUMBAR INTERBODY FUSION LUMBAR THREE-FOUR   . Appendectomy    . Colonoscopy  11/2011    Dr. Magod:ext/int hemorrhoids, diverticulosis sigmoid colon and distal desc colon, mid-desc colon hyperplastic polyps.   . Esophagogastroduodenoscopy  11/2011    Dr. Watt Climes: small hiatal hernia, one non-bleeding superficial gastric ulcer, medium-sized diverticulum in area of papilla  . [place] capsule study  11/2012    Dr. Watt Climes: minimal gastritis, normal small bowel capsule  . Esophagogastroduodenoscopy N/A 12/29/2015    Dr. Gala Romney: Chronic inactive gastritis, normal esophagus status post dilation, duodenal diverticula   Family History  Problem Relation Age of Onset  . Heart attack Father   . Heart attack Mother   . Colon cancer Neg Hx   . Bladder Cancer Brother   . Cervical cancer Daughter     ?   Social History  Substance Use Topics  .  Smoking status: Former Smoker -- 15 years    Quit date: 11/20/1994  . Smokeless tobacco: None     Comment: Quit in 1996  . Alcohol Use: No   OB History    No data available     Review of Systems  All other systems reviewed and are negative.     Allergies  Biaxin and Penicillins  Home Medications   Prior to Admission medications   Medication Sig Start Date End Date Taking? Authorizing Provider  albuterol (PROVENTIL HFA;VENTOLIN HFA) 108 (90 Base) MCG/ACT inhaler Take 2 puffs 3 times a day and every 4 hours as needed. 01/24/16   Rexene Alberts, MD  allopurinol (ZYLOPRIM) 300 MG tablet Take 300 mg by mouth daily.    Historical Provider, MD  amitriptyline (ELAVIL) 10 MG  tablet Take 20 mg by mouth at bedtime.     Historical Provider, MD  atorvastatin (LIPITOR) 80 MG tablet Take 80 mg by mouth daily.    Historical Provider, MD  diltiazem (CARDIZEM CD) 120 MG 24 hr capsule Take 1 capsule (120 mg total) by mouth daily. New add on blood pressure medicine. 01/24/16   Rexene Alberts, MD  docusate sodium (COLACE) 100 MG capsule Take 1 capsule (100 mg total) by mouth every 12 (twelve) hours. 01/09/16   Kristen N Ward, DO  fexofenadine (ALLEGRA) 180 MG tablet Take 180 mg by mouth daily.      Historical Provider, MD  fluticasone (FLONASE) 50 MCG/ACT nasal spray Place 2 sprays into both nostrils daily.    Historical Provider, MD  furosemide (LASIX) 20 MG tablet Take 20 mg by mouth daily. Reported on 03/24/2016    Historical Provider, MD  HYDROcodone-acetaminophen (NORCO) 7.5-325 MG tablet Take 1 tablet by mouth every 6 (six) hours as needed for moderate pain.    Historical Provider, MD  hydroxychloroquine (PLAQUENIL) 200 MG tablet Take 400 mg by mouth daily.    Historical Provider, MD  iron polysaccharides (NIFEREX) 150 MG capsule Take 150 mg by mouth daily.    Historical Provider, MD  leflunomide (ARAVA) 20 MG tablet Take 20 mg by mouth daily.    Historical Provider, MD  methimazole (TAPAZOLE) 10 MG tablet Take 1 tablet (10 mg total) by mouth daily. For treatment of your thyroid disorder. 03/01/16   Cassandria Anger, MD  metoCLOPramide (REGLAN) 5 MG tablet Take 10 mg by mouth at bedtime.     Historical Provider, MD  metoprolol tartrate (LOPRESSOR) 50 MG tablet Take 1 tablet (50 mg total) by mouth 2 (two) times daily. 03/01/16   Cassandria Anger, MD  mometasone-formoterol (DULERA) 100-5 MCG/ACT AERO Inhale 2 puffs into the lungs daily as needed for wheezing or shortness of breath. Reported on 03/24/2016    Historical Provider, MD  montelukast (SINGULAIR) 10 MG tablet Take 10 mg by mouth at bedtime.    Historical Provider, MD  Multiple Vitamins-Minerals (MACULAR VITAMIN BENEFIT  PO) Take 2 tablets by mouth daily.    Historical Provider, MD  nitroGLYCERIN (NITROSTAT) 0.4 MG SL tablet Place 0.4 mg under the tongue every 5 (five) minutes as needed for chest pain. Reported on 03/24/2016    Historical Provider, MD  Omega-3 Fatty Acids (FISH OIL) 1000 MG CAPS Take 1,000 mg by mouth 3 (three) times daily with meals. Reported on 03/24/2016    Historical Provider, MD  omeprazole (PRILOSEC) 20 MG capsule Take 20 mg by mouth 2 (two) times daily before a meal. Reported on 03/24/2016    Historical Provider, MD  Polyethyl Glycol-Propyl Glycol (SYSTANE OP) Apply 1 drop to eye 3 (three) times daily as needed (Dry Eyes).    Historical Provider, MD  polyethylene glycol (MIRALAX) packet Take 17 g by mouth daily. 01/09/16   Kristen N Ward, DO  sucralfate (CARAFATE) 1 GM/10ML suspension Take 10 mLs (1 g total) by mouth 4 (four) times daily. 12/22/15   Mahala Menghini, PA-C  Tapentadol HCl (NUCYNTA) 75 MG TABS Take 75 mg by mouth every 8 (eight) hours as needed (for pain). Reported on 03/24/2016    Historical Provider, MD  triamcinolone cream (KENALOG) 0.1 % Apply 1 application topically daily as needed (for irritation).     Historical Provider, MD   BP 163/82 mmHg  Pulse 110  Resp 28  Ht 5' 4.5" (1.638 m)  Wt 141 lb (63.957 kg)  BMI 23.84 kg/m2  SpO2 90% Physical Exam  Constitutional: She is oriented to person, place, and time. She appears well-developed and well-nourished. No distress.  HENT:  Head: Normocephalic and atraumatic.  Eyes: EOM are normal.  Neck: Normal range of motion.  Cardiovascular: Normal rate, regular rhythm and normal heart sounds.   Pulmonary/Chest: Effort normal and breath sounds normal.  Abdominal: Soft. She exhibits no distension. There is no tenderness.  Musculoskeletal: Normal range of motion.  Neurological: She is alert and oriented to person, place, and time.  Skin: Skin is warm and dry.  Psychiatric: She has a normal mood and affect. Judgment normal.  Nursing note  and vitals reviewed.   ED Course  Procedures (including critical care time)  CRITICAL CARE Performed by: Hoy Morn Total critical care time: 32 minutes Critical care time was exclusive of separately billable procedures and treating other patients. Critical care was necessary to treat or prevent imminent or life-threatening deterioration. Critical care was time spent personally by me on the following activities: development of treatment plan with patient and/or surrogate as well as nursing, discussions with consultants, evaluation of patient's response to treatment, examination of patient, obtaining history from patient or surrogate, ordering and performing treatments and interventions, ordering and review of laboratory studies, ordering and review of radiographic studies, pulse oximetry and re-evaluation of patient's condition.    Labs Review Labs Reviewed  CBC WITH DIFFERENTIAL/PLATELET - Abnormal; Notable for the following:    RBC 3.71 (*)    Hemoglobin 11.7 (*)    HCT 34.6 (*)    All other components within normal limits  COMPREHENSIVE METABOLIC PANEL - Abnormal; Notable for the following:    Potassium 2.8 (*)    Glucose, Bld 147 (*)    Creatinine, Ser 0.41 (*)    Calcium 8.3 (*)    Total Protein 6.4 (*)    Albumin 3.2 (*)    ALT 13 (*)    All other components within normal limits  BRAIN NATRIURETIC PEPTIDE - Abnormal; Notable for the following:    B Natriuretic Peptide 1552.0 (*)    All other components within normal limits  D-DIMER, QUANTITATIVE (NOT AT [facility]) - Abnormal; Notable for the following:    D-Dimer, Quant 0.61 (*)    All other components within normal limits  TROPONIN I    Imaging Review Dg Chest 2 View  04/29/2016  CLINICAL DATA:  Acute onset of shortness of breath and upper abdominal swelling. Initial encounter. EXAM: CHEST  2 VIEW COMPARISON:  Chest radiograph performed 02/07/2016 FINDINGS: Small bilateral pleural effusions are noted. Increased  interstitial markings are seen, with underlying vascular congestion, concerning for mild pulmonary edema. No  pneumothorax is seen. The heart is mildly enlarged. The patient is status post median sternotomy. No acute osseous abnormalities are identified. Postoperative change is noted at the upper abdomen, and along the lumbar spine. IMPRESSION: Small bilateral pleural effusions. Vascular congestion and mild cardiomegaly, with increased interstitial markings, concerning for mild pulmonary edema. Electronically Signed   By: Garald Balding M.D.   On: 04/29/2016 06:33   I have personally reviewed and evaluated these images and lab results as part of my medical decision-making.   EKG Interpretation   Date/Time:  Saturday April 29 2016 05:55:54 EDT Ventricular Rate:  110 PR Interval:  123 QRS Duration: 140 QT Interval:  387 QTC Calculation: 524 R Axis:   121 Text Interpretation:  Sinus tachycardia Consider left ventricular  hypertrophy Probable lateral infarct, age indeterminate Borderline T  abnormalities, lateral leads Prolonged QT interval LBBB is new since prior  tracing Confirmed by Jaicey Sweaney  MD, Melenie Minniear ([id number]) on 04/29/2016 6:33:38 AM      MDM   Final diagnoses:  Dyspnea  Acute pulmonary edema ([place])    Patient worsening shortness of breath.  May represent acute congestive heart failure.  She has rales bilaterally and has evidence of small bilateral pleural effusions and vascular congestion with pulmonary edema on chest x-ray.  She was hypertensive on arrival and started on nitroglycerin drip.  IV Lasix and IV nitroglycerin now.  Aspirin given. Bundle branch block.  Patient is improving.  She'll benefit from hospitalization and IV diuresis and repeat echocardiogram as her last echo in 2014 demonstrated EF of 55%  7:39 AM Patient beginning to feel better after IV nitroglycerin and IV Lasix.  Patient will need repeat echocardiogram.  Admission to the hospital.    [name], MD 04/29/16  [place], MD 04/29/16 [place], MD 04/29/16 [phone]

## 2016-04-29 NOTE — ED Notes (Signed)
Pt oxygen 84% on RA after ambulation to the restroom. Pt placed on 2L with increase in oxygen to 92%.

## 2016-04-29 NOTE — ED Notes (Signed)
Pt became diaphoretic and nauseated with 1 episode of emesis.

## 2016-04-29 NOTE — ED Notes (Signed)
This nurse attempted to get IV twice before giving nitro IV. Second nurse requested.

## 2016-04-29 NOTE — H&P (Deleted)
Medical consultation in the emergency department  Katherine Larson [id number] DOB: 1946/09/17 DOA: 04/29/2016  PCP: [name], MD  Requesting physician:Dr. [name] Cardiologist: Dr. [name]. Has appt with Dr. [name] Cancel 6/14 Patient coming from: Home  Chief Complaint: short of breath  HPI:  70 year old woman with history of coronary artery disease, CABG, grade 1 diastolic dysfunction by echo 2014 presented with 48 hours of increasing shortness of breath, chest tightness and orthopnea. Initial evaluation revealed pulmonary edema, elevated BNP, new left bundle branch block, accelerated hypertension. Referred for acute CHF and further treatment.  Patient with history of coronary artery disease but has been asymptomatic for some time, has not taken nitroglycerin in quite some time. Over the last 48 hours she's had increasing shortness of breath, made worse especially by exertion, somewhat improved by rest. She's had new onset orthopnea, persistent/constant chest tightness which was somewhat improved with nitroglycerin this morning. Her symptoms became much worse over the last 12 hours precipitating her visit to the emergency department. She reports diaphoresis, clamminess, nausea and vomiting and some left jaw pain earlier this a.m. Currently still has some chest tightness but this is improved, shortness of breath has improved after administration of IV Lasix.  ED Course: Respiratory rate 30s, heart rate 100s, hypertensive, no hypoxia. Presented with shortness of breath, exam and x-ray suggested CHF and hypertension. Started on nitroglycerin infusion, IV Lasix and aspirin given. Pertinent labs: Troponin negative, BNP 1552, potassium 2.8, hemoglobin 11.7, d-dimer 0.61 EKG: Independently reviewed. Sinus tachycardia, left bundle branch block. Second EKG: Sinus tachycardia, left bundle branch block. Compared to previous study 01/20/2016, left bundle branch block is new. Anteroseptal MI appears  old. Imaging: independently reviewed. Chest x-ray shows pulmonary edema.  Review of Systems:  Negative for fever, new visual changes, sore throat, rash, new muscle aches, bleeding  Positive for diaphoresis, dysuria, nausea and vomiting.  Past Medical History  Diagnosis Date  . NSVT (nonsustained ventricular tachycardia) ([place])   . DM2 (diabetes mellitus, type 2) ([place])   . CAD (coronary artery disease)     s/p CABG 1996;  Myoview 3/11: mod partially reversible AS perfusion defect, most likely related to variable breast attenuation although an element of scar with peri-infarct ischemia also possible, EF 54% - felt to be low risk  . HTN (hypertension)   . HLD (hyperlipidemia)   . Asthma   . Neuropathy ([place])   . Complication of anesthesia   . PONV (postoperative nausea and vomiting)   . Shortness of breath     with activity  . Seasonal allergies   . GERD (gastroesophageal reflux disease)   . Constipation   . Diarrhea   . Rheumatoid arthritis ([facility])     RA  . Gout   . Anemia     anemia of chronic disease +/- IDA followed by Dr. Earlie Server. received iron infusions in the past  . Borderline glaucoma   . Hyperthyroidism 01/21/2016    Past Surgical History  Procedure Laterality Date  . Cholecystectomy    . Ptca    . Coronary artery bypass graft  1996    2 vessels  . Hernia repair      hiatal hernia  . Eye surgery Bilateral   . Abdominal hysterectomy    . Fracture surgery Right     arm  . Maximum access (mas)posterior lumbar interbody fusion (plif) 1 level N/A 08/14/2013    Procedure:  MAXIMUM ACCESS SURGERY(MAS) POSTERIOR LUMBAR INTERBODY FUSION LUMBAR THREE-FOUR ;  Surgeon: Eustace Moore,  MD;  Location: [facility];  Service: Neurosurgery;  Laterality: N/A;   MAXIMUM ACCESS SURGERY(MAS) POSTERIOR LUMBAR INTERBODY FUSION LUMBAR THREE-FOUR   . Appendectomy    . Colonoscopy  11/2011    Dr. Magod:ext/int hemorrhoids, diverticulosis sigmoid colon and distal desc colon, mid-desc colon  hyperplastic polyps.   . Esophagogastroduodenoscopy  11/2011    Dr. Watt Climes: small hiatal hernia, one non-bleeding superficial gastric ulcer, medium-sized diverticulum in area of papilla  . [place]ens capsule study  11/2012    Dr. Watt Climes: minimal gastritis, normal small bowel capsule  . Esophagogastroduodenoscopy N/A 12/29/2015    Dr. Gala Romney: Chronic inactive gastritis, normal esophagus status post dilation, duodenal diverticula  . Back surgery       reports that she quit smoking about 21 years ago. She does not have any smokeless tobacco history on file. She reports that she does not drink alcohol or use illicit drugs.   Allergies  Allergen Reactions  . Biaxin [Clarithromycin] Rash and Other (See Comments)    Blisters in mouth   . Penicillins Rash and Other (See Comments)    Blisters in mouth Has patient had a PCN reaction causing immediate rash, facial/tongue/throat swelling, SOB or lightheadedness with hypotension: Yesyes Has patient had a PCN reaction causing severe rash involving mucus membranes or skin necrosis: Nono Has patient had a PCN reaction that required hospitalization Nono Has patient had a PCN reaction occurring within the last 10 years: Nono If all of the above answers are "NO", then may proceed    Family History  Problem Relation Age of Onset  . Heart attack Father   . Heart attack Mother   . Colon cancer Neg Hx   . Bladder Cancer Brother   . Cervical cancer Daughter     ?     Prior to Admission medications   Medication Sig Start Date End Date Taking? Authorizing Provider  albuterol (PROVENTIL HFA;VENTOLIN HFA) 108 (90 Base) MCG/ACT inhaler Take 2 puffs 3 times a day and every 4 hours as needed. 01/24/16   Rexene Alberts, MD  allopurinol (ZYLOPRIM) 300 MG tablet Take 300 mg by mouth daily.    Historical Provider, MD  amitriptyline (ELAVIL) 10 MG tablet Take 20 mg by mouth at bedtime.     Historical Provider, MD  atorvastatin (LIPITOR) 80 MG tablet Take 80 mg by mouth  daily.    Historical Provider, MD  diltiazem (CARDIZEM CD) 120 MG 24 hr capsule Take 1 capsule (120 mg total) by mouth daily. New add on blood pressure medicine. 01/24/16   Rexene Alberts, MD  docusate sodium (COLACE) 100 MG capsule Take 1 capsule (100 mg total) by mouth every 12 (twelve) hours. 01/09/16   Kristen N Ward, DO  fexofenadine (ALLEGRA) 180 MG tablet Take 180 mg by mouth daily.      Historical Provider, MD  fluticasone (FLONASE) 50 MCG/ACT nasal spray Place 2 sprays into both nostrils daily.    Historical Provider, MD  furosemide (LASIX) 20 MG tablet Take 20 mg by mouth daily. Reported on 03/24/2016    Historical Provider, MD  HYDROcodone-acetaminophen (NORCO) 7.5-325 MG tablet Take 1 tablet by mouth every 6 (six) hours as needed for moderate pain.    Historical Provider, MD  hydroxychloroquine (PLAQUENIL) 200 MG tablet Take 400 mg by mouth daily.    Historical Provider, MD  iron polysaccharides (NIFEREX) 150 MG capsule Take 150 mg by mouth daily.    Historical Provider, MD  leflunomide (ARAVA) 20 MG tablet Take 20 mg by  mouth daily.    Historical Provider, MD  methimazole (TAPAZOLE) 10 MG tablet Take 1 tablet (10 mg total) by mouth daily. For treatment of your thyroid disorder. 03/01/16   Cassandria Anger, MD  metoCLOPramide (REGLAN) 5 MG tablet Take 10 mg by mouth at bedtime.     Historical Provider, MD  metoprolol tartrate (LOPRESSOR) 50 MG tablet Take 1 tablet (50 mg total) by mouth 2 (two) times daily. 03/01/16   Cassandria Anger, MD  mometasone-formoterol (DULERA) 100-5 MCG/ACT AERO Inhale 2 puffs into the lungs daily as needed for wheezing or shortness of breath. Reported on 03/24/2016    Historical Provider, MD  montelukast (SINGULAIR) 10 MG tablet Take 10 mg by mouth at bedtime.    Historical Provider, MD  Multiple Vitamins-Minerals (MACULAR VITAMIN BENEFIT PO) Take 2 tablets by mouth daily.    Historical Provider, MD  nitroGLYCERIN (NITROSTAT) 0.4 MG SL tablet Place 0.4 mg under  the tongue every 5 (five) minutes as needed for chest pain. Reported on 03/24/2016    Historical Provider, MD  Omega-3 Fatty Acids (FISH OIL) 1000 MG CAPS Take 1,000 mg by mouth 3 (three) times daily with meals. Reported on 03/24/2016    Historical Provider, MD  omeprazole (PRILOSEC) 20 MG capsule Take 20 mg by mouth 2 (two) times daily before a meal. Reported on 03/24/2016    Historical Provider, MD  Polyethyl Glycol-Propyl Glycol (SYSTANE OP) Apply 1 drop to eye 3 (three) times daily as needed ([place]).    Historical Provider, MD  polyethylene glycol (MIRALAX) packet Take 17 g by mouth daily. 01/09/16   Kristen N Ward, DO  sucralfate (CARAFATE) 1 GM/10ML suspension Take 10 mLs (1 g total) by mouth 4 (four) times daily. 12/22/15   Mahala Menghini, PA-C  Tapentadol HCl (NUCYNTA) 75 MG TABS Take 75 mg by mouth every 8 (eight) hours as needed (for pain). Reported on 03/24/2016    Historical Provider, MD  triamcinolone cream (KENALOG) 0.1 % Apply 1 application topically daily as needed (for irritation).     Historical Provider, MD    Physical Exam: Filed Vitals:   04/29/16 0630 04/29/16 0645 04/29/16 0700 04/29/16 0737  BP: 179/91 179/106 179/99 166/87  Pulse: 111 126 120 117  Temp:      Resp: 26 22 39 32  Height:      Weight:      SpO2: 94% 91% 92% 91%   Constitutional:  . Appears calm and comfortable Eyes:  . PERRL and irises appear normal . Normal conjunctivae and lids ENMT:  . external ears, nose appear normal . grossly normal hearing . Lips appear normal Neck:  . neck appears normal, no masses, normal ROM, supple . no thyromegaly Respiratory:  . CTA bilaterally, no w/r/r.  . Respiratory effort normal. No retractions or accessory muscle use Cardiovascular:  . RRR, no m/r/g . No LE extremity edema   Abdomen:  . Abdomen appears normal; Mild generalized tenderness. . No hernias Musculoskeletal:  . Digits/nails: no clubbing, cyanosis, petechiae, infection . RUE, LUE, RLE, LLE    o strength and tone normal, no atrophy, no abnormal movements o No tenderness, masses Skin:  . No rashes, lesions, ulcers . palpation of skin: no induration or nodules Neurologic:  . Grossly normal Psychiatric:  . judgement and insight appear normal . Mental status o Mood, affect appropriate  Wt Readings from Last 3 Encounters:  04/29/16 63.957 kg (141 lb)  03/24/16 61.78 kg (136 lb 3.2 oz)  03/07/16  60.963 kg (134 lb 6.4 oz)    I have personally reviewed following labs and imaging studies  Labs on Admission:  CBC:  Recent Labs Lab 04/29/16 0600  WBC 8.0  NEUTROABS 5.2  HGB 11.7*  HCT 34.6*  MCV 93.3  PLT 0000000   Basic Metabolic Panel:  Recent Labs Lab 04/29/16 0600  NA 142  K 2.8*  CL 109  CO2 23  GLUCOSE 147*  BUN 11  CREATININE 0.41*  CALCIUM 8.3*   Liver Function Tests:  Recent Labs Lab 04/29/16 0600  AST 18  ALT 13*  ALKPHOS 76  BILITOT 0.4  PROT 6.4*  ALBUMIN 3.2*   Cardiac Enzymes:  Recent Labs Lab 04/29/16 0600  TROPONINI <0.03   Urine analysis:    Component Value Date/Time   COLORURINE YELLOW 01/19/2016 0130   APPEARANCEUR CLEAR 01/19/2016 0130   LABSPEC 1.025 01/19/2016 0130   PHURINE 6.0 01/19/2016 0130   GLUCOSEU NEGATIVE 01/19/2016 0130   HGBUR NEGATIVE 01/19/2016 0130   BILIRUBINUR NEGATIVE 01/19/2016 0130   KETONESUR 15* 01/19/2016 0130   PROTEINUR 30* 01/19/2016 0130   NITRITE NEGATIVE 01/19/2016 0130   LEUKOCYTESUR LARGE* 01/19/2016 0130   Radiological Exams on Admission: Dg Chest 2 View  04/29/2016  CLINICAL DATA:  Acute onset of shortness of breath and upper abdominal swelling. Initial encounter. EXAM: CHEST  2 VIEW COMPARISON:  Chest radiograph performed 02/07/2016 FINDINGS: Small bilateral pleural effusions are noted. Increased interstitial markings are seen, with underlying vascular congestion, concerning for mild pulmonary edema. No pneumothorax is seen. The heart is mildly enlarged. The patient is status post  median sternotomy. No acute osseous abnormalities are identified. Postoperative change is noted at the upper abdomen, and along the lumbar spine. IMPRESSION: Small bilateral pleural effusions. Vascular congestion and mild cardiomegaly, with increased interstitial markings, concerning for mild pulmonary edema. Electronically Signed   By: Garald Balding M.D.   On: 04/29/2016 06:33    EKG: Independently reviewed. As above.  Principal Problem:   Pulmonary edema Active Problems:   Coronary atherosclerosis of native coronary artery   DM type 2 (diabetes mellitus, type 2) (HCC)   Hypokalemia   Abdominal pain, chronic, epigastric   LBBB (left bundle branch block)   Assessment/Plan 1. Acute pulmonary edema, possible ACS, suspected acute diastolic congestive heart failure with new left bundle branch block and accelerated hypertension in the context of coronary artery disease, history of CABG. Symptoms of shortness of breath, orthopnea, "chest tightness" diaphoresis. BNP 1552. Initial Troponin negative. Improved after IV Lasix. 2. New left bundle branch block 3. Elevated d-dimer. Well's criteria low probability 1.5. No history of DVT. No history to suggest VTE. D-dimer within normal limits when adjusted for age. 4. Hypokalemia. Repleted in the emergency department. 5. Diabetes mellitus type 2 with neuropathy. Stable. Anion gap within normal limits. Random blood sugar less than 200. 6. Coronary artery disease, CABG 1996, nonsustained ventricular tachycardia, hypertension, hyperlipidemia 7. Rheumatoid arthritis on Plaquenil. 8. Hyperthyroidism on Tapazole and beta blocker.   With concern for ACS, new left bundle branch block and new heart failure, discussed with on-call cardiology Dr. [name] Cancel who recommends transfer to his service at [facility].  Discussed with patient who agrees  Continue diuresis. Patient has had aspirin and is currently stable for transfer.  Other comorbidities appears stable at  this time. We'll sign off. Please contact [place] at [phone] if we can be of further assistance.  DVT prophylaxis: Lovenox Code Status: Full Family Communication: Husband at bedside Disposition Plan:  Transfer to cardiology service at [facility] called: Cardiology    Time spent: 60 minutes  Murray Hodgkins, MD  [facility] Direct contact: [phone] --Via [place]  --[email or web address]; password [email or web address]  7PM-7AM contact night coverage as above  04/29/2016, 8:22 AM

## 2016-04-29 NOTE — ED Notes (Signed)
Holding Nitro via orders from Dr. Venora Maples.

## 2016-04-29 NOTE — H&P (Signed)
Physician History and Physical     Patient ID: Katherine Larson MRN: [id number] DOB/AGE: [birth year] 70 y.o. Admit date: 04/29/2016  Primary Care Physician: [name], MD Primary Cardiologist: [name] Cancel  Principal Problem:   Pulmonary edema Active Problems:   Coronary atherosclerosis of native coronary artery   DM type 2 (diabetes mellitus, type 2) (HCC)   Hypokalemia   Abdominal pain, chronic, epigastric   LBBB (left bundle branch block)   HPI:  70 y.o. transferred from [facility] for Dyspnea, new onset CHF and LBBB with SSCP.  Patient has been ill last 3 days. Increasing fatigue and dyspnea. SSCP somewhat worse With movement and deep breath. No cough sputum or fever.  Distant history of CABG in 1996 Low risk myovue in 2011.  She has chronic back pain operated on by Dr Ronnald Ramp And takes Norco 4x/day and sees pain management. On arrival to [facility] she is chest pain free. Some dyspnea but complains mostly of back pain now. Has seen Dr [name] in Past for NSVT, Atrial tachycardia and inappropriate sinus tachycardia.  CRF;s HTN, DM and elevated lipids. Compliant with meds  Enzymes negative BNP 1552 CXR  Reviewed   Small bilateral pleural effusions. Vascular congestion and mild cardiomegaly, with increased interstitial markings, concerning for mild pulmonary edema.  Review of systems complete and found to be negative unless listed above   Past Medical History  Diagnosis Date  . NSVT (nonsustained ventricular tachycardia) ([place])   . DM2 (diabetes mellitus, type 2) ([place])   . CAD (coronary artery disease)     s/p CABG 1996;  Myoview 3/11: mod partially reversible AS perfusion defect, most likely related to variable breast attenuation although an element of scar with peri-infarct ischemia also possible, EF 54% - felt to be low risk  . HTN (hypertension)   . HLD (hyperlipidemia)   . Asthma   . Neuropathy ([place])   . Complication of anesthesia   . PONV (postoperative nausea and vomiting)   .  Shortness of breath     with activity  . Seasonal allergies   . GERD (gastroesophageal reflux disease)   . Constipation   . Diarrhea   . Rheumatoid arthritis (HCC)     RA  . Gout   . Anemia     anemia of chronic disease +/- IDA followed by Dr. Earlie Server. received iron infusions in the past  . Borderline glaucoma   . Hyperthyroidism 01/21/2016    Family History  Problem Relation Age of Onset  . Heart attack Father   . Heart attack Mother   . Colon cancer Neg Hx   . Bladder Cancer Brother   . Cervical cancer Daughter     ?    Social History   Social History  . Marital Status: Married    Spouse Name: N/A  . Number of Children: 3  . Years of Education: N/A   Occupational History  . Not on file.   Social History Main Topics  . Smoking status: Former Smoker -- 15 years    Quit date: 11/20/1994  . Smokeless tobacco: Not on file     Comment: Quit in 1996  . Alcohol Use: No  . Drug Use: No  . Sexual Activity: Not on file   Other Topics Concern  . Not on file   Social History Narrative   2 living children, one deceased age 16       Past Surgical History  Procedure Laterality Date  . Cholecystectomy    . Ptca    .  Coronary artery bypass graft  1996    2 vessels  . Hernia repair      hiatal hernia  . Eye surgery Bilateral   . Abdominal hysterectomy    . Fracture surgery Right     arm  . Maximum access (mas)posterior lumbar interbody fusion (plif) 1 level N/A 08/14/2013    Procedure:  MAXIMUM ACCESS SURGERY(MAS) POSTERIOR LUMBAR INTERBODY FUSION LUMBAR THREE-FOUR ;  Surgeon: Eustace Moore, MD;  Location: [facility];  Service: Neurosurgery;  Laterality: N/A;   MAXIMUM ACCESS SURGERY(MAS) POSTERIOR LUMBAR INTERBODY FUSION LUMBAR THREE-FOUR   . Appendectomy    . Colonoscopy  11/2011    Dr. Magod:ext/int hemorrhoids, diverticulosis sigmoid colon and distal desc colon, mid-desc colon hyperplastic polyps.   . Esophagogastroduodenoscopy  11/2011    Dr. Watt Climes: small hiatal  hernia, one non-bleeding superficial gastric ulcer, medium-sized diverticulum in area of papilla  . Givens capsule study  11/2012    Dr. Watt Climes: minimal gastritis, normal small bowel capsule  . Esophagogastroduodenoscopy N/A 12/29/2015    Dr. Gala Romney: Chronic inactive gastritis, normal esophagus status post dilation, duodenal diverticula  . Back surgery       Prescriptions prior to admission  Medication Sig Dispense Refill Last Dose  . acetaminophen (TYLENOL) 325 MG tablet Take 325 mg by mouth every 6 (six) hours as needed.   04/28/2016 at Unknown time  . albuterol (PROVENTIL HFA;VENTOLIN HFA) 108 (90 Base) MCG/ACT inhaler Take 2 puffs 3 times a day and every 4 hours as needed.   04/29/2016 at Unknown time  . allopurinol (ZYLOPRIM) 300 MG tablet Take 300 mg by mouth daily.   04/28/2016 at Unknown time  . amitriptyline (ELAVIL) 10 MG tablet Take 20 mg by mouth at bedtime.    04/28/2016 at Unknown time  . atorvastatin (LIPITOR) 80 MG tablet Take 80 mg by mouth daily.   04/28/2016 at Unknown time  . diltiazem (CARDIZEM CD) 120 MG 24 hr capsule Take 1 capsule (120 mg total) by mouth daily. New add on blood pressure medicine. 30 capsule 3 04/28/2016 at Unknown time  . docusate sodium (COLACE) 100 MG capsule Take 1 capsule (100 mg total) by mouth every 12 (twelve) hours. 60 capsule 1 04/28/2016 at Unknown time  . fexofenadine (ALLEGRA) 180 MG tablet Take 180 mg by mouth daily.     04/28/2016 at Unknown time  . fluticasone (FLONASE) 50 MCG/ACT nasal spray Place 2 sprays into both nostrils daily.   04/28/2016 at Unknown time  . furosemide (LASIX) 20 MG tablet Take 20 mg by mouth daily. Reported on 03/24/2016   04/28/2016 at Unknown time  . HYDROcodone-acetaminophen (NORCO) 7.5-325 MG tablet Take 1 tablet by mouth every 6 (six) hours as needed for moderate pain.   04/28/2016 at Unknown time  . hydroxychloroquine (PLAQUENIL) 200 MG tablet Take 400 mg by mouth daily.   04/28/2016 at Unknown time  . iron polysaccharides (NIFEREX) 150 MG  capsule Take 150 mg by mouth daily.   04/28/2016 at Unknown time  . leflunomide (ARAVA) 20 MG tablet Take 20 mg by mouth daily.   04/28/2016 at Unknown time  . methimazole (TAPAZOLE) 10 MG tablet Take 1 tablet (10 mg total) by mouth daily. For treatment of your thyroid disorder. 30 tablet 3 04/28/2016 at Unknown time  . metoCLOPramide (REGLAN) 5 MG tablet Take 10 mg by mouth at bedtime.    04/28/2016 at Unknown time  . metoprolol tartrate (LOPRESSOR) 50 MG tablet Take 1 tablet (50 mg total) by  mouth 2 (two) times daily. 60 tablet 2 04/28/2016 at 1730  . montelukast (SINGULAIR) 10 MG tablet Take 10 mg by mouth at bedtime.   04/28/2016 at Unknown time  . Multiple Vitamins-Minerals (MACULAR VITAMIN BENEFIT PO) Take 2 tablets by mouth daily.   04/28/2016 at Unknown time  . nitroGLYCERIN (NITROSTAT) 0.4 MG SL tablet Place 0.4 mg under the tongue every 5 (five) minutes as needed for chest pain. Reported on 03/24/2016   04/29/2016 at Unknown time  . Omega-3 Fatty Acids (FISH OIL) 1000 MG CAPS Take 1,000 mg by mouth 3 (three) times daily with meals. Reported on 03/24/2016   04/28/2016 at Unknown time  . omeprazole (PRILOSEC) 20 MG capsule Take 20 mg by mouth 2 (two) times daily before a meal. Reported on 03/24/2016   04/28/2016 at Unknown time  . Polyethyl Glycol-Propyl Glycol (SYSTANE OP) Apply 1 drop to eye 3 (three) times daily as needed (Dry Eyes).   04/28/2016 at Unknown time  . polyethylene glycol (MIRALAX) packet Take 17 g by mouth daily. 60 each 1 04/28/2016 at Unknown time  . sucralfate (CARAFATE) 1 GM/10ML suspension Take 10 mLs (1 g total) by mouth 4 (four) times daily. 420 mL 1 04/28/2016 at Unknown time  . triamcinolone cream (KENALOG) 0.1 % Apply 1 application topically daily as needed (for irritation).    04/28/2016 at Unknown time  . mometasone-formoterol (DULERA) 100-5 MCG/ACT AERO Inhale 2 puffs into the lungs daily as needed for wheezing or shortness of breath. Reported on 04/29/2016   Not Taking at Unknown time  .  Tapentadol HCl (NUCYNTA) 75 MG TABS Take 75 mg by mouth every 8 (eight) hours as needed (for pain). Reported on 04/29/2016   Not Taking at Unknown time    Physical Exam: Blood pressure 159/77, pulse 108, temperature 98.8 F (37.1 C), temperature source Oral, resp. rate 18, height 5\' 4"  (1.626 m), weight 62.234 kg (137 lb 3.2 oz), SpO2 99 %.    Affect appropriate Healthy:  appears stated age 29: normal Neck supple with no adenopathy JVP elevated  no bruits no thyromegaly Lungs basilar rales exp wheezing and good diaphragmatic motion Heart:  S1/S2 SEM  murmur, no rub, gallop or click PMI normal Abdomen: benighn, BS positve, no tenderness, no AAA no bruit.  No HSM or HJR Distal pulses intact with no bruits No edema Neuro non-focal Skin warm and dry No muscular weakness  No current facility-administered medications on file prior to encounter.   Current Outpatient Prescriptions on File Prior to Encounter  Medication Sig Dispense Refill  . albuterol (PROVENTIL HFA;VENTOLIN HFA) 108 (90 Base) MCG/ACT inhaler Take 2 puffs 3 times a day and every 4 hours as needed.    Marland Kitchen allopurinol (ZYLOPRIM) 300 MG tablet Take 300 mg by mouth daily.    Marland Kitchen amitriptyline (ELAVIL) 10 MG tablet Take 20 mg by mouth at bedtime.     Marland Kitchen atorvastatin (LIPITOR) 80 MG tablet Take 80 mg by mouth daily.    Marland Kitchen diltiazem (CARDIZEM CD) 120 MG 24 hr capsule Take 1 capsule (120 mg total) by mouth daily. New add on blood pressure medicine. 30 capsule 3  . docusate sodium (COLACE) 100 MG capsule Take 1 capsule (100 mg total) by mouth every 12 (twelve) hours. 60 capsule 1  . fexofenadine (ALLEGRA) 180 MG tablet Take 180 mg by mouth daily.      . fluticasone (FLONASE) 50 MCG/ACT nasal spray Place 2 sprays into both nostrils daily.    . furosemide (LASIX)  20 MG tablet Take 20 mg by mouth daily. Reported on 03/24/2016    . HYDROcodone-acetaminophen (NORCO) 7.5-325 MG tablet Take 1 tablet by mouth every 6 (six) hours as needed for  moderate pain.    . hydroxychloroquine (PLAQUENIL) 200 MG tablet Take 400 mg by mouth daily.    . iron polysaccharides (NIFEREX) 150 MG capsule Take 150 mg by mouth daily.    Marland Kitchen leflunomide (ARAVA) 20 MG tablet Take 20 mg by mouth daily.    . methimazole (TAPAZOLE) 10 MG tablet Take 1 tablet (10 mg total) by mouth daily. For treatment of your thyroid disorder. 30 tablet 3  . metoCLOPramide (REGLAN) 5 MG tablet Take 10 mg by mouth at bedtime.     . metoprolol tartrate (LOPRESSOR) 50 MG tablet Take 1 tablet (50 mg total) by mouth 2 (two) times daily. 60 tablet 2  . montelukast (SINGULAIR) 10 MG tablet Take 10 mg by mouth at bedtime.    . Multiple Vitamins-Minerals (MACULAR VITAMIN BENEFIT PO) Take 2 tablets by mouth daily.    . nitroGLYCERIN (NITROSTAT) 0.4 MG SL tablet Place 0.4 mg under the tongue every 5 (five) minutes as needed for chest pain. Reported on 03/24/2016    . Omega-3 Fatty Acids (FISH OIL) 1000 MG CAPS Take 1,000 mg by mouth 3 (three) times daily with meals. Reported on 03/24/2016    . omeprazole (PRILOSEC) 20 MG capsule Take 20 mg by mouth 2 (two) times daily before a meal. Reported on 03/24/2016    . Polyethyl Glycol-Propyl Glycol (SYSTANE OP) Apply 1 drop to eye 3 (three) times daily as needed (Dry Eyes).    . polyethylene glycol (MIRALAX) packet Take 17 g by mouth daily. 60 each 1  . sucralfate (CARAFATE) 1 GM/10ML suspension Take 10 mLs (1 g total) by mouth 4 (four) times daily. 420 mL 1  . triamcinolone cream (KENALOG) 0.1 % Apply 1 application topically daily as needed (for irritation).     . mometasone-formoterol (DULERA) 100-5 MCG/ACT AERO Inhale 2 puffs into the lungs daily as needed for wheezing or shortness of breath. Reported on 04/29/2016    . Tapentadol HCl (NUCYNTA) 75 MG TABS Take 75 mg by mouth every 8 (eight) hours as needed (for pain). Reported on 04/29/2016    . [DISCONTINUED] dexlansoprazole (DEXILANT) 60 MG capsule Take 1 capsule (60 mg total) by mouth daily. (Patient  not taking: Reported on 01/08/2016) 30 capsule 5  . [DISCONTINUED] Linaclotide (LINZESS) 290 MCG CAPS capsule Take 1 capsule (290 mcg total) by mouth daily. (Patient not taking: Reported on 01/08/2016) 90 capsule 1    Labs:   Lab Results  Component Value Date   WBC 8.0 04/29/2016   HGB 11.7* 04/29/2016   HCT 34.6* 04/29/2016   MCV 93.3 04/29/2016   PLT 261 04/29/2016    Recent Labs Lab 04/29/16 0600  NA 142  K 2.8*  CL 109  CO2 23  BUN 11  CREATININE 0.41*  CALCIUM 8.3*  PROT 6.4*  BILITOT 0.4  ALKPHOS 76  ALT 13*  AST 18  GLUCOSE 147*   Lab Results  Component Value Date   TROPONINI <0.03 04/29/2016   TROPONINI <0.03 08/27/2015   TROPONINI <0.03 08/26/2015     Lab Results  Component Value Date   CHOL 112 12/19/2007   CHOL 112 08/26/2007   CHOL 105 02/25/2007   Lab Results  Component Value Date   HDL 30.2* 12/19/2007   HDL 30.0* 08/26/2007   HDL 34.7* 02/25/2007  Lab Results  Component Value Date   LDLCALC 46 12/19/2007   LDLCALC 51 08/26/2007   LDLCALC 40 02/25/2007   Lab Results  Component Value Date   TRIG 177* 12/19/2007   TRIG 156* 08/26/2007   TRIG 152* 02/25/2007   Lab Results  Component Value Date   CHOLHDL 3.7 CALC 12/19/2007   CHOLHDL 3.7 CALC 08/26/2007   CHOLHDL 3.0 CALC 02/25/2007   No results found for: LDLDIRECT     Radiology: Dg Chest 2 View  04/29/2016  CLINICAL DATA:  Acute onset of shortness of breath and upper abdominal swelling. Initial encounter. EXAM: CHEST  2 VIEW COMPARISON:  Chest radiograph performed 02/07/2016 FINDINGS: Small bilateral pleural effusions are noted. Increased interstitial markings are seen, with underlying vascular congestion, concerning for mild pulmonary edema. No pneumothorax is seen. The heart is mildly enlarged. The patient is status post median sternotomy. No acute osseous abnormalities are identified. Postoperative change is noted at the upper abdomen, and along the lumbar spine. IMPRESSION: Small  bilateral pleural effusions. Vascular congestion and mild cardiomegaly, with increased interstitial markings, concerning for mild pulmonary edema. Electronically Signed   By: Garald Balding M.D.   On: 04/29/2016 06:33    EKG:  ? Atrial tachcyardia LBBB  ASSESSMENT AND PLAN:  CHF:  EF normal by echo in 2014.  Suppl K continue diuresis LBBB new Old grafts will need right and left heart cath on Monday Update echo.    Rhythm:  In SR/ST on telemetry now not clear if she had atrial tachycardia on ECG with rate related LBBB will repeat.  Continue lopressor 50 bid  CAD: Distant CABG with abnormal myovue 2011 ? Shifting breast artifact.  Given ECG changes, rhythm abnormality and new CHF favor right and left Cath on Monday No heparin as enzymes negative so far.    Thyroid:  On tapasole last TSH still suppressed may be related to arrhythmia continue beta blocker and tapazole Check TSH/T4  Lab Results  Component Value Date   TSH 0.129* 01/22/2016   Back Pain: chronic told nurse she could have her Norco q 6 hrs     Signed: Collier Salina Nishan6/08/2016, 1:49 PM

## 2016-04-29 NOTE — ED Notes (Signed)
Family came out to nursing desk reports that pt is throwing up, Dr Venora Maples notified, additional orders received,

## 2016-04-30 ENCOUNTER — Inpatient Hospital Stay (HOSPITAL_COMMUNITY): Payer: Medicare Other

## 2016-04-30 DIAGNOSIS — I509 Heart failure, unspecified: Secondary | ICD-10-CM

## 2016-04-30 DIAGNOSIS — I5021 Acute systolic (congestive) heart failure: Secondary | ICD-10-CM

## 2016-04-30 LAB — ECHOCARDIOGRAM COMPLETE
AO mean calculated velocity dopler: 108 cm/s
AV Area VTI index: 0.98 cm2/m2
AV Area VTI: 1.59 cm2
AV Area mean vel: 1.53 cm2
AV Mean grad: 6 mmHg
AV Peak grad: 9 mmHg
AV VEL mean LVOT/AV: 0.6
AV area mean vel ind: 0.92 cm2/m2
AV peak Index: 0.95
AV pk vel: 151 cm/s
AV vel: 1.64
Ao pk vel: 0.63 m/s
Ao-asc: 28 cm
E decel time: 176 msec
E/e' ratio: 16.41
FS: 17 % — AB (ref 28–44)
Height: 64 in
IVS/LV PW RATIO, ED: 0.87
LA ID, A-P, ES: 43 mm
LA diam end sys: 43 mm
LA diam index: 2.58 cm/m2
LA vol A4C: 74.5 ml
LA vol index: 40.3 mL/m2
LA vol: 67.2 mL
LV E/e' medial: 16.41
LV E/e'average: 16.41
LV PW d: 12.3 mm — AB (ref 0.6–1.1)
LV dias vol index: 70 mL/m2
LV dias vol: 116 mL — AB (ref 46–106)
LV e' LATERAL: 7.07 cm/s
LV sys vol index: 41 mL/m2
LV sys vol: 68 mL — AB (ref 14–42)
LVOT SV: 48 mL
LVOT VTI: 18.9 cm
LVOT area: 2.54 cm2
LVOT diameter: 18 mm
LVOT peak VTI: 0.65 cm
LVOT peak vel: 94.8 cm/s
MV Dec: 176
MV Peak grad: 5 mmHg
MV pk A vel: 110 m/s
MV pk E vel: 116 m/s
P 1/2 time: 409 ms
PV Reg grad dias: 14 mmHg
PV Reg vel dias: 189 cm/s
Simpson's disk: 41
Stroke v: 48 ml
TAPSE: 17.1 mm
TDI e' lateral: 7.07
TDI e' medial: 4.57
VTI: 29.2 cm
Valve area index: 0.98
Valve area: 1.64 cm2
Weight: 2155.2 oz

## 2016-04-30 LAB — URINE CULTURE: Culture: <10000 — AB

## 2016-04-30 LAB — BASIC METABOLIC PANEL
Anion gap: 9 (ref 5–15)
BUN: 6 mg/dL (ref 6–20)
CO2: 27 mmol/L (ref 22–32)
Calcium: 8.4 mg/dL — ABNORMAL LOW (ref 8.9–10.3)
Chloride: 105 mmol/L (ref 101–111)
Creatinine, Ser: 0.55 mg/dL (ref 0.44–1.00)
GFR calc Af Amer: >60 mL/min (ref >60)
GFR calc non Af Amer: >60 mL/min (ref >60)
Glucose, Bld: 104 mg/dL — ABNORMAL HIGH (ref 65–99)
Potassium: 3.1 mmol/L — ABNORMAL LOW (ref 3.5–5.1)
Sodium: 141 mmol/L (ref 135–145)

## 2016-04-30 LAB — GLUCOSE, CAPILLARY
Glucose-Capillary: 91 mg/dL (ref 65–99)
Glucose-Capillary: 98 mg/dL (ref 65–99)
Glucose-Capillary: 108 mg/dL — ABNORMAL HIGH (ref 65–99)

## 2016-04-30 MED ORDER — SODIUM CHLORIDE 0.9% FLUSH: 3.0000 mL | INTRAVENOUS | Status: DC | PRN

## 2016-04-30 MED ORDER — SODIUM CHLORIDE 0.9 % WEIGHT BASED INFUSION
1.0000 mL/kg/h | INTRAVENOUS | Status: DC
  Administered 2016-05-01: 1 mL/kg/h via INTRAVENOUS

## 2016-04-30 MED ORDER — SODIUM CHLORIDE 0.9 % IV SOLN: 250.0000 mL | INTRAVENOUS | Status: DC | PRN

## 2016-04-30 MED ORDER — SODIUM CHLORIDE 0.9 % WEIGHT BASED INFUSION
3.0000 mL/kg/h | INTRAVENOUS | Status: DC
  Administered 2016-05-01: 3 mL/kg/h via INTRAVENOUS

## 2016-04-30 MED ORDER — ASPIRIN 81 MG PO CHEW
81.0000 mg | CHEWABLE_TABLET | ORAL | Status: AC
  Administered 2016-05-01: 81 mg via ORAL
  Filled 2016-04-30: qty 1

## 2016-04-30 MED ORDER — SODIUM CHLORIDE 0.9% FLUSH
3.0000 mL | Freq: Two times a day (BID) | INTRAVENOUS | Status: DC
  Administered 2016-04-30: 3 mL via INTRAVENOUS

## 2016-04-30 NOTE — Progress Notes (Signed)
Patient ID: Katherine Larson, female   DOB: Mar 23, 1946, 70 y.o.   MRN: [id number]    Subjective:  Denies SSCP, palpitations or Dyspnea   Objective:  Filed Vitals:   04/29/16 1034 04/29/16 1700 04/29/16 2022 04/30/16 0532  BP: 159/77 162/74 153/73 124/63  Pulse: 108  109 90  Temp: 98.8 F (37.1 C)  99.1 F (37.3 C) 98.6 F (37 C)  TempSrc: Oral  Oral Oral  Resp: 18  18 18   Height: 5\' 4"  (1.626 m)     Weight: 62.234 kg (137 lb 3.2 oz)   61.1 kg (134 lb 11.2 oz)  SpO2: 99%  95% 98%    Intake/Output from previous day:  Intake/Output Summary (Last 24 hours) at 04/30/16 1019 Last data filed at 04/30/16 0900  Gross per 24 hour  Intake    880 ml  Output   1550 ml  Net   -670 ml    Physical Exam: Affect appropriate Healthy: appears stated age HEENT: normal Neck supple with no adenopathy JVP elevated no bruits no thyromegaly Lungs basilar rales exp wheezing and good diaphragmatic motion Heart: S1/S2 SEM murmur, no rub, gallop or click PMI normal Abdomen: benighn, BS positve, no tenderness, no AAA no bruit. No HSM or HJR Distal pulses intact with no bruits No edema Neuro non-focal Skin warm and dry No muscular weakness  Lab Results: Basic Metabolic Panel:  Recent Labs  04/29/16 0600 04/29/16 1846 04/30/16 0331  NA 142  --  141  K 2.8*  --  3.1*  CL 109  --  105  CO2 23  --  27  GLUCOSE 147*  --  104*  BUN 11  --  6  CREATININE 0.41* 0.61 0.55  CALCIUM 8.3*  --  8.4*   Liver Function Tests:  Recent Labs  04/29/16 0600  AST 18  ALT 13*  ALKPHOS 76  BILITOT 0.4  PROT 6.4*  ALBUMIN 3.2*   No results for input(s): LIPASE, AMYLASE in the last 72 hours. CBC:  Recent Labs  04/29/16 0600 04/29/16 1846  WBC 8.0 6.5  NEUTROABS 5.2  --   HGB 11.7* 11.2*  HCT 34.6* 34.0*  MCV 93.3 92.4  PLT 261 229   Cardiac Enzymes:  Recent Labs  04/29/16 0600  TROPONINI <0.03   BNP: Invalid input(s): POCBNP D-Dimer:  Recent Labs  04/29/16 0600    DDIMER 0.61*   Hemoglobin A1C: No results for input(s): HGBA1C in the last 72 hours. Fasting Lipid Panel: No results for input(s): CHOL, HDL, LDLCALC, TRIG, CHOLHDL, LDLDIRECT in the last 72 hours. Thyroid Function Tests:  Recent Labs  04/29/16 1846  TSH 1.793   Anemia Panel: No results for input(s): VITAMINB12, FOLATE, FERRITIN, TIBC, IRON, RETICCTPCT in the last 72 hours.  Imaging: Dg Chest 2 View  04/29/2016  CLINICAL DATA:  Acute onset of shortness of breath and upper abdominal swelling. Initial encounter. EXAM: CHEST  2 VIEW COMPARISON:  Chest radiograph performed 02/07/2016 FINDINGS: Small bilateral pleural effusions are noted. Increased interstitial markings are seen, with underlying vascular congestion, concerning for mild pulmonary edema. No pneumothorax is seen. The heart is mildly enlarged. The patient is status post median sternotomy. No acute osseous abnormalities are identified. Postoperative change is noted at the upper abdomen, and along the lumbar spine. IMPRESSION: Small bilateral pleural effusions. Vascular congestion and mild cardiomegaly, with increased interstitial markings, concerning for mild pulmonary edema. Electronically Signed   By: Garald Balding M.D.   On: 04/29/2016 06:33  Cardiac Studies:  ECG:  Tachycardia ? Flutter LBBB   Telemetry:  SR rate 90's LBBB   Echo: being done prelim EF 35-40%   Medications:   . allopurinol  300 mg Oral Daily  . amitriptyline  20 mg Oral QHS  . aspirin EC  81 mg Oral Daily  . atorvastatin  80 mg Oral Daily  . diltiazem  120 mg Oral Daily  . docusate sodium  100 mg Oral Q12H  . enoxaparin (LOVENOX) injection  40 mg Subcutaneous Q24H  . fluticasone  2 spray Each Nare Daily  . furosemide  60 mg Intravenous Daily  . hydroxychloroquine  400 mg Oral Daily  . iron polysaccharides  150 mg Oral Daily  . leflunomide  20 mg Oral Daily  . loratadine  10 mg Oral Daily  . methimazole  10 mg Oral Daily  . metoCLOPramide  10  mg Oral QHS  . metoprolol tartrate  50 mg Oral BID  . montelukast  10 mg Oral QHS  . pantoprazole  40 mg Oral Daily  . polyethylene glycol  17 g Oral Daily  . potassium chloride  20 mEq Oral TID  . sodium chloride flush  3 mL Intravenous Q12H  . sucralfate  1 g Oral QID     . nitroGLYCERIN      Assessment/Plan:  CHF: EF normal by echo in 2014. Suppl K continue diuresis LBBB new Old grafts will need right and left heart cath on Monday Update echo. At bedside preliminary EF 35-40%   Rhythm: In SR/ST on telemetry now not clear if she had atrial tachycardia on ECG with rate related LBBB will repeat. Continue lopressor 50 bid Telemetry this  Am with SR   CAD: Distant CABG with abnormal myovue 2011 ? Shifting breast artifact. Given ECG changes, rhythm abnormality and new CHF favor right and left Cath on Monday No heparin as enzymes negative so far.   Thyroid: Beta blocker and tapazole TSH normal now 1.79         Back Pain: chronic told nurse she could have her Norco q 6 hrs      Orders for cath written and patient placed on board  Jenkins Rouge 04/30/2016, 10:19 AM

## 2016-04-30 NOTE — Progress Notes (Signed)
*  PRELIMINARY RESULTS* Echocardiogram 2D Echocardiogram has been performed.  Beryle Beams 04/30/2016, 11:14 AM

## 2016-05-01 ENCOUNTER — Encounter (HOSPITAL_COMMUNITY)
Admission: EM | Disposition: A | Discharge status: Home / Self Care | Payer: Self-pay | Source: Home / Self Care | Attending: Cardiovascular Disease

## 2016-05-01 DIAGNOSIS — I5041 Acute combined systolic (congestive) and diastolic (congestive) heart failure: Secondary | ICD-10-CM

## 2016-05-01 DIAGNOSIS — J810 Acute pulmonary edema: Secondary | ICD-10-CM | POA: Insufficient documentation

## 2016-05-01 HISTORY — PX: CARDIAC CATHETERIZATION: SHX172

## 2016-05-01 LAB — POCT I-STAT 3, ART BLOOD GAS (G3+)
Acid-Base Excess: 5 mmol/L — ABNORMAL HIGH (ref 0.0–2.0)
Bicarbonate: 28.7 mEq/L — ABNORMAL HIGH (ref 20.0–24.0)
O2 Saturation: 91 %
TCO2: 30 mmol/L (ref 0–100)
pCO2 arterial: 38.5 mmHg (ref 35.0–45.0)
pH, Arterial: 7.482 — ABNORMAL HIGH (ref 7.350–7.450)
pO2, Arterial: 56 mmHg — ABNORMAL LOW (ref 80.0–100.0)

## 2016-05-01 LAB — POCT I-STAT 3, VENOUS BLOOD GAS (G3P V)
Acid-Base Excess: 5 mmol/L — ABNORMAL HIGH (ref 0.0–2.0)
Bicarbonate: 29.6 mEq/L — ABNORMAL HIGH (ref 20.0–24.0)
O2 Saturation: 56 %
TCO2: 31 mmol/L (ref 0–100)
pCO2, Ven: 43.2 mmHg — ABNORMAL LOW (ref 45.0–50.0)
pH, Ven: 7.444 — ABNORMAL HIGH (ref 7.250–7.300)
pO2, Ven: 28 mmHg — ABNORMAL LOW (ref 31.0–45.0)

## 2016-05-01 LAB — BASIC METABOLIC PANEL
Anion gap: 11 (ref 5–15)
BUN: 10 mg/dL (ref 6–20)
CO2: 27 mmol/L (ref 22–32)
Calcium: 8.5 mg/dL — ABNORMAL LOW (ref 8.9–10.3)
Chloride: 102 mmol/L (ref 101–111)
Creatinine, Ser: 0.56 mg/dL (ref 0.44–1.00)
GFR calc Af Amer: >60 mL/min (ref >60)
GFR calc non Af Amer: >60 mL/min (ref >60)
Glucose, Bld: 105 mg/dL — ABNORMAL HIGH (ref 65–99)
Potassium: 4.2 mmol/L (ref 3.5–5.1)
Sodium: 140 mmol/L (ref 135–145)

## 2016-05-01 LAB — GLUCOSE, CAPILLARY
Glucose-Capillary: 111 mg/dL — ABNORMAL HIGH (ref 65–99)
Glucose-Capillary: 113 mg/dL — ABNORMAL HIGH (ref 65–99)
Glucose-Capillary: 82 mg/dL (ref 65–99)
Glucose-Capillary: 93 mg/dL (ref 65–99)
Glucose-Capillary: 116 mg/dL — ABNORMAL HIGH (ref 65–99)

## 2016-05-01 LAB — PROTIME-INR
INR: 1.15 (ref 0.00–1.49)
Prothrombin Time: 14.9 seconds (ref 11.6–15.2)

## 2016-05-01 SURGERY — RIGHT/LEFT HEART CATH AND CORONARY/GRAFT ANGIOGRAPHY
Anesthesia: LOCAL

## 2016-05-01 MED ORDER — FENTANYL CITRATE (PF) 100 MCG/2ML IJ SOLN
INTRAMUSCULAR | Status: AC
  Filled 2016-05-01: qty 2

## 2016-05-01 MED ORDER — SODIUM CHLORIDE 0.9% FLUSH: 3.0000 mL | INTRAVENOUS | Status: DC | PRN

## 2016-05-01 MED ORDER — FENTANYL CITRATE (PF) 100 MCG/2ML IJ SOLN
INTRAMUSCULAR | Status: DC | PRN
  Administered 2016-05-01: 25 ug via INTRAVENOUS

## 2016-05-01 MED ORDER — SODIUM CHLORIDE 0.9 % WEIGHT BASED INFUSION
3.0000 mL/kg/h | INTRAVENOUS | Status: AC
  Administered 2016-05-01: 3 mL/kg/h via INTRAVENOUS

## 2016-05-01 MED ORDER — IOPAMIDOL (ISOVUE-370) INJECTION 76%
INTRAVENOUS | Status: DC | PRN
  Administered 2016-05-01: 30 mL via INTRA_ARTERIAL

## 2016-05-01 MED ORDER — HEPARIN (PORCINE) IN NACL 2-0.9 UNIT/ML-% IJ SOLN
INTRAMUSCULAR | Status: DC | PRN
  Administered 2016-05-01: 1500 mL

## 2016-05-01 MED ORDER — MIDAZOLAM HCL 2 MG/2ML IJ SOLN
INTRAMUSCULAR | Status: AC
  Filled 2016-05-01: qty 2

## 2016-05-01 MED ORDER — SODIUM CHLORIDE 0.9 % IV SOLN: 250.0000 mL | INTRAVENOUS | Status: DC | PRN

## 2016-05-01 MED ORDER — SODIUM CHLORIDE 0.9% FLUSH
3.0000 mL | Freq: Two times a day (BID) | INTRAVENOUS | Status: DC
  Administered 2016-05-01 – 2016-05-03 (×4): 3 mL via INTRAVENOUS

## 2016-05-01 MED ORDER — HEPARIN (PORCINE) IN NACL 2-0.9 UNIT/ML-% IJ SOLN
INTRAMUSCULAR | Status: AC
  Filled 2016-05-01: qty 1500

## 2016-05-01 MED ORDER — LIDOCAINE HCL (PF) 1 % IJ SOLN
INTRAMUSCULAR | Status: AC
  Filled 2016-05-01: qty 30

## 2016-05-01 MED ORDER — LIDOCAINE HCL (PF) 1 % IJ SOLN
INTRAMUSCULAR | Status: DC | PRN
  Administered 2016-05-01: 23 mL

## 2016-05-01 MED ORDER — IOPAMIDOL (ISOVUE-370) INJECTION 76%
INTRAVENOUS | Status: AC
  Filled 2016-05-01: qty 125

## 2016-05-01 MED ORDER — MIDAZOLAM HCL 2 MG/2ML IJ SOLN
INTRAMUSCULAR | Status: DC | PRN
Start: 2016-05-01 — End: 2016-05-01
  Administered 2016-05-01: 1 mg via INTRAVENOUS

## 2016-05-01 SURGICAL SUPPLY — 9 items
CATH INFINITI 5FR MULTPACK ANG (CATHETERS) ×2 IMPLANT
CATH SWAN GANZ 7F STRAIGHT (CATHETERS) ×2 IMPLANT
KIT HEART LEFT (KITS) ×2 IMPLANT
KIT HEART RIGHT NAMIC (KITS) ×2 IMPLANT
PACK CARDIAC CATHETERIZATION (CUSTOM PROCEDURE TRAY) ×2 IMPLANT
SHEATH PINNACLE 5F 10CM (SHEATH) ×2 IMPLANT
SHEATH PINNACLE 7F 10CM (SHEATH) ×2 IMPLANT
TRANSDUCER W/STOPCOCK (MISCELLANEOUS) ×4 IMPLANT
WIRE EMERALD 3MM-J .035X150CM (WIRE) ×2 IMPLANT

## 2016-05-01 NOTE — Clinical Documentation Improvement (Signed)
Cardiology Family Medicine  Can the diagnosis of CHF be further specified? Please document response in next progress note. Thank you!    Acuity - Acute, Chronic, Acute on Chronic   Type - Systolic, Diastolic, Systolic and Diastolic  Other  Clinically Undetermined  Document any associated diagnoses/conditions  Supporting Information:  BNP = 1552  Echo:  prelim EF 35-40%;  EF normal by echo in 2014 per Cardiology progress note on 04/30/16  Being treated with: IV Lasix 60 mg daily and Lopressor 50 mg twice daily  Please exercise your independent, professional judgment when responding. A specific answer is not anticipated or expected.  Thank You,  Zoila Shutter RN, BSN, [place] [phone]; Cell: [phone]

## 2016-05-01 NOTE — Interval H&P Note (Signed)
History and Physical Interval Note:  05/01/2016 1:53 PM  Katherine Larson  has presented today for surgery, with the diagnosis of new left bundle-branch block, known CAD-CABG and now CHF - cardiac myopathy.  The various methods of treatment have been discussed with the patient and family. After consideration of risks, benefits and other options for treatment, the patient has consented to  Procedure(s): Right/Left Heart Cath and Coronary/Graft Angiography (N/A) With Possible Percutaneous Coronary Intervention as a surgical intervention .  The patient's history has been reviewed, patient examined, no change in status, stable for surgery.  I have reviewed the patient's chart and labs.  Questions were answered to the patient's satisfaction.    Cardiomyopathies (Right and Left Heart Catheterization OR  Right Heart Catheterization Alone With/Without Left Ventriculography and Coronary Angiography)   Cath Lab Visit (complete for each Cath Lab visit)  Clinical Evaluation Leading to the Procedure:   ACS: Yes.    Non-ACS:     Anginal Classification: CCS III - dyspnea and chest tightness  Anti-ischemic medical therapy: Maximal Therapy (2 or more classes of medications)  Non-Invasive Test Results: Equivocal test results - newly noted reduced EF on echo  Prior CABG: Previous CABG  Patient Information:    Known or suspected cardiomyopathy with or without heart failure  AUC Score:   A (7)   Indication:   93  AUC FOR PCI IF Indicated.   Glenetta Hew

## 2016-05-01 NOTE — Care Management Important Message (Signed)
Important Message  Patient Details  Name: Katherine Larson MRN: [id number] Date of Birth: 07-20-1946   Medicare Important Message Given:  Yes    [name] 05/01/2016, 1:49 PM

## 2016-05-01 NOTE — Clinical Documentation Improvement (Signed)
Cardiology  Can the diagnosis of CRF documented in H&P be further specified? Please document findings in next progress note. Thank you!   CKD Stage I - GFR greater than or equal to 90  CKD Stage II - GFR 60-89  CKD Stage III - GFR 30-59  CKD Stage IV - GFR 15-29  CKD Stage V - GFR < 15  ESRD (End Stage Renal Disease)  Other condition  Unable to clinically determine  Supporting Information: : (risk factors, signs and symptoms, diagnostics, treatment)  White female  GFR's for current admission running > 60.  Please exercise your independent, professional judgment when responding. A specific answer is not anticipated or expected.  Thank You, Zoila Shutter RN, BSN, [place] [phone]; Cell: [phone]

## 2016-05-01 NOTE — H&P (View-Only) (Signed)
Patient ID: Katherine Larson, female   DOB: [birth year], 70 y.o.   MRN: [id number]    Subjective:  Denies SSCP, palpitations or Dyspnea   Objective:  Filed Vitals:   04/29/16 1034 04/29/16 1700 04/29/16 2022 04/30/16 0532  BP: 159/77 162/74 153/73 124/63  Pulse: 108  109 90  Temp: 98.8 F (37.1 C)  99.1 F (37.3 C) 98.6 F (37 C)  TempSrc: Oral  Oral Oral  Resp: 18  18 18   Height: 5\' 4"  (1.626 m)     Weight: 62.234 kg (137 lb 3.2 oz)   61.1 kg (134 lb 11.2 oz)  SpO2: 99%  95% 98%    Intake/Output from previous day:  Intake/Output Summary (Last 24 hours) at 04/30/16 1019 Last data filed at 04/30/16 0900  Gross per 24 hour  Intake    880 ml  Output   1550 ml  Net   -670 ml    Physical Exam: Affect appropriate Healthy: appears stated age HEENT: normal Neck supple with no adenopathy JVP elevated no bruits no thyromegaly Lungs basilar rales exp wheezing and good diaphragmatic motion Heart: S1/S2 SEM murmur, no rub, gallop or click PMI normal Abdomen: benighn, BS positve, no tenderness, no AAA no bruit. No HSM or HJR Distal pulses intact with no bruits No edema Neuro non-focal Skin warm and dry No muscular weakness  Lab Results: Basic Metabolic Panel:  Recent Labs  04/29/16 0600 04/29/16 1846 04/30/16 0331  NA 142  --  141  K 2.8*  --  3.1*  CL 109  --  105  CO2 23  --  27  GLUCOSE 147*  --  104*  BUN 11  --  6  CREATININE 0.41* 0.61 0.55  CALCIUM 8.3*  --  8.4*   Liver Function Tests:  Recent Labs  04/29/16 0600  AST 18  ALT 13*  ALKPHOS 76  BILITOT 0.4  PROT 6.4*  ALBUMIN 3.2*   No results for input(s): LIPASE, AMYLASE in the last 72 hours. CBC:  Recent Labs  04/29/16 0600 04/29/16 1846  WBC 8.0 6.5  NEUTROABS 5.2  --   HGB 11.7* 11.2*  HCT 34.6* 34.0*  MCV 93.3 92.4  PLT 261 229   Cardiac Enzymes:  Recent Labs  04/29/16 0600  TROPONINI <0.03   BNP: Invalid input(s): POCBNP D-Dimer:  Recent Labs  04/29/16 0600    DDIMER 0.61*   Hemoglobin A1C: No results for input(s): HGBA1C in the last 72 hours. Fasting Lipid Panel: No results for input(s): CHOL, HDL, LDLCALC, TRIG, CHOLHDL, LDLDIRECT in the last 72 hours. Thyroid Function Tests:  Recent Labs  04/29/16 1846  TSH 1.793   Anemia Panel: No results for input(s): VITAMINB12, FOLATE, FERRITIN, TIBC, IRON, RETICCTPCT in the last 72 hours.  Imaging: Dg Chest 2 View  04/29/2016  CLINICAL DATA:  Acute onset of shortness of breath and upper abdominal swelling. Initial encounter. EXAM: CHEST  2 VIEW COMPARISON:  Chest radiograph performed 02/07/2016 FINDINGS: Small bilateral pleural effusions are noted. Increased interstitial markings are seen, with underlying vascular congestion, concerning for mild pulmonary edema. No pneumothorax is seen. The heart is mildly enlarged. The patient is status post median sternotomy. No acute osseous abnormalities are identified. Postoperative change is noted at the upper abdomen, and along the lumbar spine. IMPRESSION: Small bilateral pleural effusions. Vascular congestion and mild cardiomegaly, with increased interstitial markings, concerning for mild pulmonary edema. Electronically Signed   By: Garald Balding M.D.   On: 04/29/2016 06:33  Cardiac Studies:  ECG:  Tachycardia ? Flutter LBBB   Telemetry:  SR rate 90's LBBB   Echo: being done prelim EF 35-40%   Medications:   . allopurinol  300 mg Oral Daily  . amitriptyline  20 mg Oral QHS  . aspirin EC  81 mg Oral Daily  . atorvastatin  80 mg Oral Daily  . diltiazem  120 mg Oral Daily  . docusate sodium  100 mg Oral Q12H  . enoxaparin (LOVENOX) injection  40 mg Subcutaneous Q24H  . fluticasone  2 spray Each Nare Daily  . furosemide  60 mg Intravenous Daily  . hydroxychloroquine  400 mg Oral Daily  . iron polysaccharides  150 mg Oral Daily  . leflunomide  20 mg Oral Daily  . loratadine  10 mg Oral Daily  . methimazole  10 mg Oral Daily  . metoCLOPramide  10  mg Oral QHS  . metoprolol tartrate  50 mg Oral BID  . montelukast  10 mg Oral QHS  . pantoprazole  40 mg Oral Daily  . polyethylene glycol  17 g Oral Daily  . potassium chloride  20 mEq Oral TID  . sodium chloride flush  3 mL Intravenous Q12H  . sucralfate  1 g Oral QID     . nitroGLYCERIN      Assessment/Plan:  CHF: EF normal by echo in 2014. Suppl K continue diuresis LBBB new Old grafts will need right and left heart cath on Monday Update echo. At bedside preliminary EF 35-40%   Rhythm: In SR/ST on telemetry now not clear if she had atrial tachycardia on ECG with rate related LBBB will repeat. Continue lopressor 50 bid Telemetry this  Am with SR   CAD: Distant CABG with abnormal myovue 2011 ? Shifting breast artifact. Given ECG changes, rhythm abnormality and new CHF favor right and left Cath on Monday No heparin as enzymes negative so far.   Thyroid: Beta blocker and tapazole TSH normal now 1.79         Back Pain: chronic told nurse she could have her Norco q 6 hrs      Orders for cath written and patient placed on board  Jenkins Rouge 04/30/2016, 10:19 AM

## 2016-05-01 NOTE — Progress Notes (Signed)
For cath today.  Jeff Niall Illes, MD    

## 2016-05-02 ENCOUNTER — Encounter (HOSPITAL_COMMUNITY): Payer: Self-pay | Admitting: Cardiology

## 2016-05-02 LAB — BASIC METABOLIC PANEL
Anion gap: 10 (ref 5–15)
BUN: 10 mg/dL (ref 6–20)
CO2: 27 mmol/L (ref 22–32)
Calcium: 8.6 mg/dL — ABNORMAL LOW (ref 8.9–10.3)
Chloride: 102 mmol/L (ref 101–111)
Creatinine, Ser: 0.61 mg/dL (ref 0.44–1.00)
GFR calc Af Amer: >60 mL/min (ref >60)
GFR calc non Af Amer: >60 mL/min (ref >60)
Glucose, Bld: 92 mg/dL (ref 65–99)
Potassium: 4.4 mmol/L (ref 3.5–5.1)
Sodium: 139 mmol/L (ref 135–145)

## 2016-05-02 LAB — GLUCOSE, CAPILLARY
Glucose-Capillary: 115 mg/dL — ABNORMAL HIGH (ref 65–99)
Glucose-Capillary: 87 mg/dL (ref 65–99)
Glucose-Capillary: 130 mg/dL — ABNORMAL HIGH (ref 65–99)

## 2016-05-02 MED ORDER — LISINOPRIL 2.5 MG PO TABS
2.5000 mg | ORAL_TABLET | Freq: Two times a day (BID) | ORAL | Status: DC
  Administered 2016-05-02 – 2016-05-03 (×2): 2.5 mg via ORAL
  Filled 2016-05-02 (×2): qty 1

## 2016-05-02 MED ORDER — CARVEDILOL 12.5 MG PO TABS
12.5000 mg | ORAL_TABLET | Freq: Two times a day (BID) | ORAL | Status: DC
  Administered 2016-05-02 – 2016-05-03 (×2): 12.5 mg via ORAL
  Filled 2016-05-02 (×2): qty 1

## 2016-05-02 MED ORDER — FUROSEMIDE 40 MG PO TABS
40.0000 mg | ORAL_TABLET | Freq: Two times a day (BID) | ORAL | Status: DC
  Administered 2016-05-02 – 2016-05-03 (×2): 40 mg via ORAL
  Filled 2016-05-02 (×2): qty 1

## 2016-05-02 NOTE — Progress Notes (Signed)
Patient Name: Katherine Larson Date of Encounter: 05/02/2016  Principal Problem:   Acute combined systolic and diastolic heart failure (HCC) Active Problems:   Coronary atherosclerosis of native coronary artery   DM type 2 (diabetes mellitus, type 2) (HCC)   Hypokalemia   Abdominal pain, chronic, epigastric   LBBB (left bundle branch block)   Acute pulmonary edema [facility])   Primary Cardiologist: Dr [name] Cancel  Patient Profile: 70 y.o. With hx CABG, 1996, NSVT and atrial tach, HTN, DM, HLD, transferred from [facility] for Dyspnea, new onset CHF and LBBB with SSCP  SUBJECTIVE: Still SOB with minimal activity, such as up to BR, not on O2 at home. No chest pain. Has abd pain, referred to [facility] for ?mesenteric ischemia>>mesenteric vascular disease but no high grade stenosis per Dr Serena Croissant note.  OBJECTIVE Filed Vitals:   05/01/16 2015 05/01/16 2038 05/02/16 0012 05/02/16 0431  BP:  105/51 113/59 113/74  Pulse:  83 88 86  Temp:  98.1 F (36.7 C) 98.4 F (36.9 C) 98.3 F (36.8 C)  TempSrc:  Oral Oral Oral  Resp:  18 16 16   Height:      Weight:    130 lb 11.2 oz (59.285 kg)  SpO2: 95% 98% 94% 97%    Intake/Output Summary (Last 24 hours) at 05/02/16 1346 Last data filed at 05/02/16 1300  Gross per 24 hour  Intake   1020 ml  Output   2550 ml  Net  -1530 ml   Filed Weights   04/30/16 0532 05/01/16 0631 05/02/16 0431  Weight: 134 lb 11.2 oz (61.1 kg) 133 lb 8 oz (60.555 kg) 130 lb 11.2 oz (59.285 kg)    PHYSICAL EXAM General: Well developed, well nourished, female in no acute distress. Head: Normocephalic, atraumatic.  Neck: Supple without bruits, JVD not elevated Lungs:  Resp regular and unlabored, few rales bases, good air exchange. Heart: RRR, S1, S2, no S3, S4, soft murmur; no rub. Abdomen: Soft, non-tender, non-distended, BS + x 4.  Extremities: No clubbing, cyanosis, edema. R groin cath site without ecchymosis, hematoma or bruit Neuro: Alert and oriented X 3. Moves all  extremities spontaneously. Psych: Normal affect.  LABS: CBC:  Recent Labs  04/29/16 1846  WBC 6.5  HGB 11.2*  HCT 34.0*  MCV 92.4  PLT 229   INR:  Recent Labs  05/01/16 0228  INR [id number]   Basic Metabolic Panel:  Recent Labs  05/01/16 0228 05/02/16 0236  NA 140 139  K 4.2 4.4  CL 102 102  CO2 27 27  GLUCOSE 105* 92  BUN 10 10  CREATININE 0.56 0.61  CALCIUM 8.5* 8.6*   BNP:  B NATRIURETIC PEPTIDE  Date/Time Value Ref Range Status  04/29/2016 06:00 AM 1552.0* 0.0 - 100.0 pg/mL Final  01/19/2016 10:40 AM 931.0* 0.0 - 100.0 pg/mL Final   Thyroid Function Tests:  Recent Labs  04/29/16 1846  TSH 1.793   TELE:  SR, ST   ECHO: 06/13 - Left ventricle: The cavity size was severely dilated. Wall  thickness was normal. Systolic function was severely reduced. The  estimated ejection fraction was in the range of 25% to 30%.  Diffuse hypokinesis. Doppler parameters are consistent with both  elevated ventricular end-diastolic filling pressure and elevated  left atrial filling pressure. - Aortic valve: There was trivial regurgitation. Valve area (VTI):  1.64 cm^2. Valve area (Vmax): 1.59 cm^2. Valve area (Vmean): 1.53 cm^2. - Mitral valve: There was mild regurgitation. - Left atrium: The  atrium was moderately dilated. - Atrial septum: No defect or patent foramen ovale was identified.  Cath: 06/12 1. Mid LAD lesion, 100% stenosed. The lesion was previously treated with a bare metal stent greater than two years ago. 2. Sequential LIMA-D1-LAD was injected is large, and is anatomically normal. Anastomosis is distal to occluded LAD stent 3. Essentially normal right heart cath pressures with normal LVEDP Based on the evidence of widely patent LIMA graft and native arteries nongrafted, the sudden drop in EF is likely nonischemic in nature.  Right Heart Pressures Normal right heart pressures Elevated LV EDP consistent with volume overload. Borderline elevated LVEDP  of 14 with PCWP of 7-  She appears to be relatively euvolemic and adequately diuresed. Plan:  Sheath removal in Cath Lab with manual pressure for hemostasis.  Return to nursing unit for continued management by primary service.  Expect that she should be ready for discharge soon, pending reevaluation by primary team.  Current Medications:  . allopurinol  300 mg Oral Daily  . amitriptyline  20 mg Oral QHS  . aspirin EC  81 mg Oral Daily  . atorvastatin  80 mg Oral Daily  . diltiazem  120 mg Oral Daily  . docusate sodium  100 mg Oral Q12H  . enoxaparin (LOVENOX) injection  40 mg Subcutaneous Q24H  . fluticasone  2 spray Each Nare Daily  . furosemide  60 mg Intravenous Daily  . hydroxychloroquine  400 mg Oral Daily  . iron polysaccharides  150 mg Oral Daily  . leflunomide  20 mg Oral Daily  . loratadine  10 mg Oral Daily  . methimazole  10 mg Oral Daily  . metoCLOPramide  10 mg Oral QHS  . metoprolol tartrate  50 mg Oral BID  . montelukast  10 mg Oral QHS  . pantoprazole  40 mg Oral Daily  . polyethylene glycol  17 g Oral Daily  . potassium chloride  20 mEq Oral TID  . sodium chloride flush  3 mL Intravenous Q12H  . sodium chloride flush  3 mL Intravenous Q12H  . sucralfate  1 g Oral QID   . nitroGLYCERIN      ASSESSMENT AND PLAN: Principal Problem:   Acute combined systolic and diastolic heart failure (HCC) - weight down 11 lbs since admit, I/O neg 3.7 L - LVEDP 14 and PCWP 7 at cath 06/12 - however, pt still c/o SOB with minimal exertion - continue IV Lasix for now, ck sats with ambulation off O2 - if desats with ambulation, will need to ck ABG +/- d-dimer or CTA chest  Active Problems:   Coronary atherosclerosis of native coronary artery - no ischemic sx - on ASA, Lipitor, metoprolol    DM type 2 (diabetes mellitus, type 2) (HCC) - add SSI    Hypokalemia - improved w/ supplement    Abdominal pain, chronic, epigastric - has seen GI here and at [facility] - no  critical mesenteric stenosis, abd [place] ok - f/u as OP    LBBB (left bundle branch block) - follow    Acute pulmonary edema ([place]) - improved, see above  Plan - ambulate and see how tolerated, may need PT/OT eval  Signed, Barrett, Rhonda , PA-C 1:46 PM 05/02/2016  Agree with note by Rosaria Ferries PA-C  NISCM . Graft patent. Will D/C Cardizem, change metop to coreg. She has had a good diuresis. Transition to PO lasix. Ambulate. Check O2 sats while ambulating on RA. Prob home tomorrow.   Pearletha Forge.  Gwenlyn Found, M.D., FACP, [facility] [place] [address] [place], [place]  [id number]  [phone] 05/02/2016 1:50 PM

## 2016-05-03 ENCOUNTER — Ambulatory Visit: Payer: Medicare Other | Admitting: Cardiovascular Disease

## 2016-05-03 DIAGNOSIS — I428 Other cardiomyopathies: Secondary | ICD-10-CM

## 2016-05-03 LAB — BASIC METABOLIC PANEL
Anion gap: 9 (ref 5–15)
BUN: 12 mg/dL (ref 6–20)
CO2: 28 mmol/L (ref 22–32)
Calcium: 9 mg/dL (ref 8.9–10.3)
Chloride: 98 mmol/L — ABNORMAL LOW (ref 101–111)
Creatinine, Ser: 0.85 mg/dL (ref 0.44–1.00)
GFR calc Af Amer: >60 mL/min (ref >60)
GFR calc non Af Amer: >60 mL/min (ref >60)
Glucose, Bld: 124 mg/dL — ABNORMAL HIGH (ref 65–99)
Potassium: 4.4 mmol/L (ref 3.5–5.1)
Sodium: 135 mmol/L (ref 135–145)

## 2016-05-03 LAB — GLUCOSE, CAPILLARY
Glucose-Capillary: 124 mg/dL — ABNORMAL HIGH (ref 65–99)
Glucose-Capillary: 109 mg/dL — ABNORMAL HIGH (ref 65–99)

## 2016-05-03 MED ORDER — FUROSEMIDE 40 MG PO TABS: 40.0000 mg | ORAL_TABLET | Freq: Two times a day (BID) | ORAL | Status: DC

## 2016-05-03 MED ORDER — POTASSIUM CHLORIDE CRYS ER 20 MEQ PO TBCR: 20.0000 meq | EXTENDED_RELEASE_TABLET | Freq: Two times a day (BID) | ORAL | Status: DC

## 2016-05-03 MED ORDER — CARVEDILOL 12.5 MG PO TABS: 12.5000 mg | ORAL_TABLET | Freq: Two times a day (BID) | ORAL | Status: DC

## 2016-05-03 MED ORDER — ASPIRIN 81 MG PO TBEC: 81.0000 mg | DELAYED_RELEASE_TABLET | Freq: Every day | ORAL | Status: DC

## 2016-05-03 NOTE — Discharge Instructions (Signed)
Heart Failure  Heart failure means your heart has trouble pumping blood. This makes it hard for your body to work well. Heart failure is usually a long-term (chronic) condition. You must take good care of yourself and follow your doctor's treatment plan.  HOME CARE   Take your heart medicine as told by your doctor.    Do not stop taking medicine unless your doctor tells you to.    Do not skip any dose of medicine.    Refill your medicines before they run out.    Take other medicines only as told by your doctor or pharmacist.   Stay active if told by your doctor. The elderly and people with severe heart failure should talk with a doctor about physical activity.   Eat heart-healthy foods. Choose foods that are without trans fat and are low in saturated fat, cholesterol, and salt (sodium). This includes fresh or frozen fruits and vegetables, fish, lean meats, fat-free or low-fat dairy foods, whole grains, and high-fiber foods. Lentils and dried peas and beans (legumes) are also good choices.   Limit salt if told by your doctor.   Cook in a healthy way. Roast, grill, broil, bake, poach, steam, or stir-fry foods.   Limit fluids as told by your doctor.   Weigh yourself every morning. Do this after you pee (urinate) and before you eat breakfast. Write down your weight to give to your doctor.   Take your blood pressure and write it down if your doctor tells you to.   Ask your doctor how to check your pulse. Check your pulse as told.   Lose weight if told by your doctor.   Stop smoking or chewing tobacco. Do not use gum or patches that help you quit without your doctor's approval.   Schedule and go to doctor visits as told.   Nonpregnant women should have no more than 1 drink a day. Men should have no more than 2 drinks a day. Talk to your doctor about drinking alcohol.   Stop illegal drug use.   Stay current with shots (immunizations).   Manage your health conditions as told by your doctor.   Learn to  manage your stress.   Rest when you are tired.   If it is really hot outside:    Avoid intense activities.    Use air conditioning or fans, or get in a cooler place.    Avoid caffeine and alcohol.    Wear loose-fitting, lightweight, and light-colored clothing.   If it is really cold outside:    Avoid intense activities.    Layer your clothing.    Wear mittens or gloves, a hat, and a scarf when going outside.    Avoid alcohol.   Learn about heart failure and get support as needed.   Get help to maintain or improve your quality of life and your ability to care for yourself as needed.  GET HELP IF:    You gain weight quickly.   You are more short of breath than usual.   You cannot do your normal activities.   You tire easily.   You cough more than normal, especially with activity.   You have any or more puffiness (swelling) in areas such as your hands, feet, ankles, or belly (abdomen).   You cannot sleep because it is hard to breathe.   You feel like your heart is beating fast (palpitations).   You get dizzy or light-headed when you stand up.  GET HELP   RIGHT AWAY IF:    You have trouble breathing.   There is a change in mental status, such as becoming less alert or not being able to focus.   You have chest pain or discomfort.   You faint.  MAKE SURE YOU:    Understand these instructions.   Will watch your condition.   Will get help right away if you are not doing well or get worse.     This information is not intended to replace advice given to you by your health care provider. Make sure you discuss any questions you have with your health care provider.     Document Released: 08/15/2008 Document Revised: 11/27/2014 Document Reviewed: 12/23/2012  Elsevier Interactive Patient Education 2016 [employer]

## 2016-05-03 NOTE — Care Management Note (Signed)
Case Management Note  Patient Details  Name: Katherine Larson MRN: [id number] Date of Birth: 06-18-1946  Subjective/Objective:     CHF               Action/Plan: Patient lives at home with her spouse, new CHF; she eats a heart healthy diet, does not eat out a lot; CM talked to the patient about weighing herself daily and writing it down to monitor her weight gain. Spouse has CHF and knows about daily weight, diet and he was reinforcing information to the patient about her plan of care. Patient PCP is Dr Harlan Stains; private insurance with [facility] with prescription drug coverage; pharmacy of choice is [facility]. She is independent of her ADL's; no needs identified at this time.  Expected Discharge Date:     05/03/2016             Expected Discharge Plan:  Home/Self Care  Discharge planning Services  CM Consult  Choice offered to:  Patient, Spouse  Status of Service:  Completed, signed off  Medicare Important Message Given:  Yes  Sherrilyn Rist [phone] 05/03/2016, 11:39 AM

## 2016-05-03 NOTE — Progress Notes (Signed)
Patient Name: Katherine Larson Date of Encounter: 05/03/2016  Principal Problem:   Acute combined systolic and diastolic heart failure Katherine Larson) Active Problems:   Hx of CABG '96- cath 05/01/16   NICM- new drop in EF 25-30%   Essential hypertension   DM type 2 (diabetes mellitus, type 2) (HCC)   Abdominal pain, chronic, epigastric   Hyperlipidemia   Hypokalemia   Rheumatoid aortitis (HCC)   LBBB (left bundle branch block)   Primary Cardiologist: Dr Katherine Larson  Patient Profile: 70 y.o. With hx CABG, 1996, NSVT and atrial tach, HTN, DM, HLD, transferred from [facility] for Dyspnea, new onset CHF and LBBB with SSCP  SUBJECTIVE: Still SOB with minimal activity, such as up to BR, not on O2 at home. No chest pain. Has abd pain, referred to Katherine Larson for ?mesenteric ischemia>>mesenteric vascular disease but no high grade stenosis per Dr Katherine Larson note.  OBJECTIVE Filed Vitals:   05/02/16 1444 05/02/16 2007 05/03/16 0601 05/03/16 0945  BP: 120/64 106/62 114/59 115/57  Pulse: 88 94 94   Temp: 98.4 F (36.9 C) 98.6 F (37 C) 98.4 F (36.9 C)   TempSrc: Oral Oral Oral   Resp: 18 18 18    Height:      Weight:   129 lb 9.6 oz (58.786 kg)   SpO2: 94% 97% 98%     Intake/Output Summary (Last 24 hours) at 05/03/16 1120 Last data filed at 05/03/16 0952  Gross per 24 hour  Intake   1440 ml  Output   1200 ml  Net    240 ml   Filed Weights   05/01/16 0631 05/02/16 0431 05/03/16 0601  Weight: 133 lb 8 oz (60.555 kg) 130 lb 11.2 oz (59.285 kg) 129 lb 9.6 oz (58.786 kg)    PHYSICAL EXAM General: Well developed, well nourished, female in no acute distress. Head: Normocephalic, atraumatic.  Neck: Supple without bruits, JVD not elevated Lungs:  Resp regular and unlabored, few rales bases, good air exchange. Heart: RRR, S1, S2, no S3, S4, soft murmur; no rub. Abdomen: Soft, non-tender, non-distended, BS + x 4.  Extremities: No clubbing, cyanosis, edema. R groin cath site without ecchymosis,  hematoma or bruit Neuro: Alert and oriented X 3. Moves all extremities spontaneously. Psych: Normal affect.  LABS: CBC: No results for input(s): WBC, NEUTROABS, HGB, HCT, MCV, PLT in the last 72 hours. INR:  Recent Labs  05/01/16 0228  INR [id number]   Basic Metabolic Panel:  Recent Labs  05/02/16 0236 05/03/16 0325  NA 139 135  K 4.4 4.4  CL 102 98*  CO2 27 28  GLUCOSE 92 124*  BUN 10 12  CREATININE 0.61 0.85  CALCIUM 8.6* 9.0   BNP:  B NATRIURETIC PEPTIDE  Date/Time Value Ref Range Status  04/29/2016 06:00 AM 1552.0* 0.0 - 100.0 pg/mL Final  01/19/2016 10:40 AM 931.0* 0.0 - 100.0 pg/mL Final   Thyroid Function Tests: No results for input(s): TSH, T4TOTAL, T3FREE, THYROIDAB in the last 72 hours.  Invalid input(s): FREET3 TELE:  SR, ST   ECHO: 06/13 - Left ventricle: The cavity size was severely dilated. Wall  thickness was normal. Systolic function was severely reduced. The  estimated ejection fraction was in the range of 25% to 30%.  Diffuse hypokinesis. Doppler parameters are consistent with both  elevated ventricular end-diastolic filling pressure and elevated  left atrial filling pressure. - Aortic valve: There was trivial regurgitation. Valve area (VTI):  1.64 cm^2. Valve area (Vmax): 1.59 cm^2. Valve area (  Vmean): 1.53 cm^2. - Mitral valve: There was mild regurgitation. - Left atrium: The atrium was moderately dilated. - Atrial septum: No defect or patent foramen ovale was identified.  Cath: 06/12 1. Mid Katherine Larson lesion, 100% stenosed. The lesion was previously treated with a bare metal stent greater than two years ago. 2. Sequential LIMA-D1-Katherine Larson was injected is large, and is anatomically normal. Anastomosis is distal to occluded Katherine Larson stent 3. Essentially normal right heart cath pressures with normal LVEDP Based on the evidence of widely patent LIMA graft and native arteries nongrafted, the sudden drop in EF is likely nonischemic in nature.  Right Heart  Pressures Normal right heart pressures Elevated LV EDP consistent with volume overload. Borderline elevated LVEDP of 14 with PCWP of 7-  She appears to be relatively euvolemic and adequately diuresed. Plan:  Sheath removal in Cath Lab with manual pressure for hemostasis.  Return to nursing unit for continued management by primary service.  Expect that she should be ready for discharge soon, pending reevaluation by primary team.  Current Medications:  . allopurinol  300 mg Oral Daily  . amitriptyline  20 mg Oral QHS  . aspirin EC  81 mg Oral Daily  . atorvastatin  80 mg Oral Daily  . carvedilol  12.5 mg Oral BID WC  . docusate sodium  100 mg Oral Q12H  . enoxaparin (Katherine Larson) injection  40 mg Subcutaneous Q24H  . fluticasone  2 spray Each Nare Daily  . furosemide  40 mg Oral BID  . hydroxychloroquine  400 mg Oral Daily  . iron polysaccharides  150 mg Oral Daily  . leflunomide  20 mg Oral Daily  . lisinopril  2.5 mg Oral BID  . loratadine  10 mg Oral Daily  . methimazole  10 mg Oral Daily  . metoCLOPramide  10 mg Oral QHS  . montelukast  10 mg Oral QHS  . pantoprazole  40 mg Oral Daily  . polyethylene glycol  17 g Oral Daily  . potassium chloride  20 mEq Oral TID  . potassium chloride  20 mEq Oral BID  . sodium chloride flush  3 mL Intravenous Q12H  . sodium chloride flush  3 mL Intravenous Q12H  . sucralfate  1 g Oral QID   . nitroGLYCERIN      ASSESSMENT AND PLAN: Principal Problem:   Acute combined systolic and diastolic heart failure (HCC) - weight down 12 lbs since admit, I/O neg 3.7 L - LVEDP 14 and PCWP 7 at cath 06/12   Active Problems:   Coronary atherosclerosis of native coronary artery - no ischemic sx - on ASA, Lipitor, metoprolol    DM type 2 (diabetes mellitus, type 2) ([place]) - add SSI    Hypokalemia - improved w/ supplement    Abdominal pain, chronic, epigastric - has seen GI here and at [facility] - no critical mesenteric stenosis, abd [place] ok - f/u as  OP    LBBB (left bundle branch block) - follow    Acute pulmonary edema ([place]) - improved, see above  Plan -Home today-TOC f/u with labs in 7-10 days in [place]. Decrease K+ to 20 meq BID.  [name] Form , PA-C 11:20 AM 05/03/2016 Patient seen and examined. I agree with the assessment and plan as detailed above. See also my additional thoughts below.   Patient is stable and ready to go home.  Dola Argyle, MD, [facility] 05/03/2016 12:54 PM

## 2016-05-03 NOTE — Progress Notes (Signed)
D/c instructions discussed with pt. All questions answered and pt verbalized understanding. Pt d/c home via [facility] with her husband.

## 2016-05-03 NOTE — Discharge Summary (Signed)
Patient ID: Katherine Larson,  MRN: [id number], DOB/AGE: Apr 05, 1946 70 y.o.  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 With hx CABG 1996, NSVT and atrial tach, HTN, DM, HLD, transferred from [facility] 04/29/16 for CHF with a new drop in her EF mand chest pain. Pt has diuresed 12 lbs. Cath 05/01/16 revealed an occluded native LAD at a prior stent site. She had a widely patent LIMA graft to her LAD. She had normal Rt heart pressures. It was felt that the drop in her EF was secondary to NICM. He medications were adjusted and we feel she is stable for discharge 05/03/16. She'll need a TOC f/u in 7-10 days with labs.   Discharge Vitals:  Blood pressure 115/57, pulse 94, temperature 98.4 F (36.9 C), temperature source Oral, resp. rate 18, height 5\' 4"  (1.626 m), weight 129 lb 9.6 oz (58.786 kg), SpO2 98 %.    Labs: Results for orders placed or performed during the hospital encounter of 04/29/16 (from the past 24 hour(s))  Glucose, capillary     Status: Abnormal   Collection Time: 05/02/16 11:30 AM  Result Value Ref Range   Glucose-Capillary 115 (H) 65 - 99 mg/dL   Comment 1 Notify RN   Glucose, capillary     Status: None   Collection Time: 05/02/16  5:09 PM  Result Value Ref Range   Glucose-Capillary 87 65 - 99 mg/dL  Glucose, capillary     Status: Abnormal   Collection Time: 05/02/16  8:57 PM  Result Value Ref Range   Glucose-Capillary 130  (H) 65 - 99 mg/dL  Basic metabolic panel     Status: Abnormal   Collection Time: 05/03/16  3:25 AM  Result Value Ref Range   Sodium 135 135 - 145 mmol/L   Potassium 4.4 3.5 - 5.1 mmol/L   Chloride 98 (L) 101 - 111 mmol/L   CO2 28 22 - 32 mmol/L   Glucose, Bld 124 (H) 65 - 99 mg/dL   BUN 12 6 - 20 mg/dL   Creatinine, Ser 0.85 0.44 - 1.00 mg/dL   Calcium 9.0 8.9 - 10.3 mg/dL   GFR calc non Af Amer >60 >60 mL/min   GFR calc Af Amer >60 >60 mL/min   Anion gap 9 5 - 15  Glucose, capillary     Status: Abnormal   Collection Time: 05/03/16  6:07 AM  Result Value Ref Range   Glucose-Capillary 124 (H) 65 - 99 mg/dL    Disposition:      Follow-up Information    Follow up with Jory Sims, NP.   Specialties:  Nurse Practitioner, Radiology, Cardiology   Why:  office will call you   Contact information:   [place] [address] [phone]       Discharge Medications:    Medication List    STOP taking these medications  diltiazem 120 MG 24 hr capsule  Commonly known as:  CARDIZEM CD     metoprolol 50 MG tablet  Commonly known as:  LOPRESSOR      TAKE these medications        acetaminophen 325 MG tablet  Commonly known as:  TYLENOL  Take 325 mg by mouth every 6 (six) hours as needed.     albuterol 108 (90 Base) MCG/ACT inhaler  Commonly known as:  PROVENTIL HFA;VENTOLIN HFA  Take 2 puffs 3 times a day and every 4 hours as needed.     allopurinol 300 MG tablet  Commonly known as:  ZYLOPRIM  Take 300 mg by mouth daily.     amitriptyline 10 MG tablet  Commonly known as:  ELAVIL  Take 20 mg by mouth at bedtime.     aspirin 81 MG EC tablet  Take 1 tablet (81 mg total) by mouth daily.     atorvastatin 80 MG tablet  Commonly known as:  LIPITOR  Take 80 mg by mouth daily.     carvedilol 12.5 MG tablet  Commonly known as:  COREG  Take 1 tablet (12.5 mg total) by mouth 2 (two) times daily with a meal.     docusate sodium 100 MG capsule    Commonly known as:  COLACE  Take 1 capsule (100 mg total) by mouth every 12 (twelve) hours.     fexofenadine 180 MG tablet  Commonly known as:  ALLEGRA  Take 180 mg by mouth daily.     Fish Oil 1000 MG Caps  Take 1,000 mg by mouth 3 (three) times daily with meals. Reported on 03/24/2016     fluticasone 50 MCG/ACT nasal spray  Commonly known as:  FLONASE  Place 2 sprays into both nostrils daily.     furosemide 40 MG tablet  Commonly known as:  LASIX  Take 1 tablet (40 mg total) by mouth 2 (two) times daily.     HYDROcodone-acetaminophen 7.5-325 MG tablet  Commonly known as:  NORCO  Take 1 tablet by mouth every 6 (six) hours as needed for moderate pain.     hydroxychloroquine 200 MG tablet  Commonly known as:  PLAQUENIL  Take 400 mg by mouth daily.     iron polysaccharides 150 MG capsule  Commonly known as:  NIFEREX  Take 150 mg by mouth daily.     leflunomide 20 MG tablet  Commonly known as:  ARAVA  Take 20 mg by mouth daily.     MACULAR VITAMIN BENEFIT PO  Take 2 tablets by mouth daily.     methimazole 10 MG tablet  Commonly known as:  TAPAZOLE  Take 1 tablet (10 mg total) by mouth daily. For treatment of your thyroid disorder.     metoCLOPramide 5 MG tablet  Commonly known as:  REGLAN  Take 10 mg by mouth at bedtime.     mometasone-formoterol 100-5 MCG/ACT Aero  Commonly known as:  DULERA  Inhale 2 puffs into the lungs daily as needed for wheezing or shortness of breath. Reported on 04/29/2016     montelukast 10 MG tablet  Commonly known as:  SINGULAIR  Take 10 mg by mouth at bedtime.     nitroGLYCERIN 0.4 MG SL tablet  Commonly known as:  NITROSTAT  Place 0.4 mg under the tongue every 5 (five) minutes as needed for chest pain. Reported on 03/24/2016     NUCYNTA 75 MG Tabs  Generic drug:  Tapentadol HCl  Take 75  mg by mouth every 8 (eight) hours as needed (for pain). Reported on 04/29/2016     omeprazole 20 MG capsule  Commonly known as:  PRILOSEC  Take 20  mg by mouth 2 (two) times daily before a meal. Reported on 03/24/2016     polyethylene glycol packet  Commonly known as:  MIRALAX  Take 17 g by mouth daily.     sucralfate 1 GM/10ML suspension  Commonly known as:  CARAFATE  Take 10 mLs (1 g total) by mouth 4 (four) times daily.     SYSTANE OP  Apply 1 drop to eye 3 (three) times daily as needed (Dry Eyes).     triamcinolone cream 0.1 %  Commonly known as:  KENALOG  Apply 1 application topically daily as needed (for irritation).         Duration of Discharge Encounter: Greater than 30 minutes including physician time.  [name] Form PA-C 05/03/2016 11:19 AM  Patient seen and examined. I agree with the assessment and plan as detailed above. See also my additional thoughts below.   I made the decision for discharge. I agree with note and plans above.  Dola Argyle, MD, [facility] 05/03/2016 12:55 PM

## 2016-05-10 ENCOUNTER — Other Ambulatory Visit (HOSPITAL_COMMUNITY)
Admission: RE | Admit: 2016-05-10 | Discharge: 2016-05-10 | Disposition: A | Discharge status: Home / Self Care | Payer: Medicare Other | Source: Ambulatory Visit | Attending: Physician Assistant | Admitting: Physician Assistant

## 2016-05-10 ENCOUNTER — Encounter: Payer: Self-pay | Admitting: Physician Assistant

## 2016-05-10 ENCOUNTER — Ambulatory Visit (INDEPENDENT_AMBULATORY_CARE_PROVIDER_SITE_OTHER): Payer: Medicare Other | Admitting: Physician Assistant

## 2016-05-10 ENCOUNTER — Ambulatory Visit: Payer: Medicare Other | Admitting: "Endocrinology

## 2016-05-10 VITALS — BP 126/82 | HR 130 | Ht 64.5 in | Wt 128.6 lb

## 2016-05-10 DIAGNOSIS — I10 Essential (primary) hypertension: Secondary | ICD-10-CM

## 2016-05-10 DIAGNOSIS — I428 Other cardiomyopathies: Secondary | ICD-10-CM | POA: Diagnosis not present

## 2016-05-10 DIAGNOSIS — I25810 Atherosclerosis of coronary artery bypass graft(s) without angina pectoris: Secondary | ICD-10-CM

## 2016-05-10 DIAGNOSIS — I429 Cardiomyopathy, unspecified: Secondary | ICD-10-CM | POA: Diagnosis not present

## 2016-05-10 DIAGNOSIS — I5042 Chronic combined systolic (congestive) and diastolic (congestive) heart failure: Secondary | ICD-10-CM | POA: Diagnosis not present

## 2016-05-10 DIAGNOSIS — R300 Dysuria: Secondary | ICD-10-CM

## 2016-05-10 DIAGNOSIS — R000 Tachycardia, unspecified: Secondary | ICD-10-CM

## 2016-05-10 DIAGNOSIS — I110 Hypertensive heart disease with heart failure: Secondary | ICD-10-CM | POA: Diagnosis present

## 2016-05-10 LAB — BASIC METABOLIC PANEL
Anion gap: 10 (ref 5–15)
BUN: 15 mg/dL (ref 6–20)
CO2: 27 mmol/L (ref 22–32)
Calcium: 8.7 mg/dL — ABNORMAL LOW (ref 8.9–10.3)
Chloride: 98 mmol/L — ABNORMAL LOW (ref 101–111)
Creatinine, Ser: 0.68 mg/dL (ref 0.44–1.00)
GFR calc Af Amer: >60 mL/min (ref >60)
GFR calc non Af Amer: >60 mL/min (ref >60)
Glucose, Bld: 137 mg/dL — ABNORMAL HIGH (ref 65–99)
Potassium: 3.5 mmol/L (ref 3.5–5.1)
Sodium: 135 mmol/L (ref 135–145)

## 2016-05-10 MED ORDER — CARVEDILOL 25 MG PO TABS: 25.0000 mg | ORAL_TABLET | Freq: Two times a day (BID) | ORAL | Status: DC

## 2016-05-10 NOTE — Patient Instructions (Signed)
Your physician recommends that you schedule a follow-up appointment with Dr. [name] Cancel   Your physician has recommended you make the following change in your medication:  Increase Coreg to 25 mg Two Times Daily  Your physician recommends that you have lab work done today.  Your physician recommends that you schedule a follow-up appointment on Friday for an EKG.   Call the office if your heart rate continues to stay above 100.   Thank you for choosing [facility]!

## 2016-05-10 NOTE — Progress Notes (Signed)
Cardiology Office Note    Date:  05/10/2016   ID:  Katherine Larson, DOB September 24, 1946, MRN [id number]  PCP:  [name], MD  Cardiologist: Dr. Fidel Levy) EPS: Dr. [name]  Chief complaint: palpitations shortness of breath  History of Present Illness:  Katherine Larson is a 70 y.o. female With hx CABG 1996, NSVT and atrial tach, HTN, DM, HLD, transferred from [facility] 04/29/16 for CHF with a new drop in her EF mand chest pain. Pt has diuresed 12 lbs. Cath 05/01/16 revealed an occluded native LAD at a prior stent site. She had a widely patent LIMA graft to her LAD. She had normal Rt heart pressures. It was felt that the drop in her EF was secondary to NICM. He medications were adjusted and we feel she is stable for discharge 05/03/16.  Patient last saw Dr. [name] 05/2015 because of a history of atrial tachycardia. 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 He did not change medications and recommended a period of watchful waiting.   Patient comes in for 7 day TOC posthospitalization visit. She says her heart has been racing since she was in the hospital. Her metoprolol was changed to Coreg. Her heart rates have been between 115 and 132 bpm. She is short of breath with this. She is also weak. She denies any chest pain, dizziness or presyncope. She also complains of decreased urine output despite Lasix and burning. She said she had a UTI prior to going into the hospital and finished taken her antibiotics but is still burning. She sees Dr. Mercy Moore for this. She has not contacted him. She also has chronic back pain and is scheduled for an implant for pain control in July. She also has chronic abdominal pain and is getting a second opinion at [facility] next week for possible mesenteric ischemia.      Past Medical History  Diagnosis Date  . NSVT (nonsustained ventricular tachycardia) ([place])   . DM2 (diabetes mellitus, type 2) ([place])   . CAD (coronary artery  disease)     s/p CABG 1996;  Myoview 3/11: mod partially reversible AS perfusion defect, most likely related to variable breast attenuation although an element of scar with peri-infarct ischemia also possible, EF 54% - felt to be low risk  . HTN (hypertension)   . HLD (hyperlipidemia)   . Asthma   . Neuropathy ([place])   . Complication of anesthesia   . PONV (postoperative nausea and vomiting)   . Shortness of breath     with activity  . Seasonal allergies   . GERD (gastroesophageal reflux disease)   . Constipation   . Diarrhea   . Rheumatoid arthritis (HCC)     RA  . Gout   . Anemia     anemia of chronic disease +/- IDA followed by Dr. Earlie Server. received iron infusions in the past  . Borderline glaucoma   . Hyperthyroidism 01/21/2016    Past Surgical History  Procedure Laterality Date  . Cholecystectomy    . Ptca    . Coronary artery bypass graft  1996    2 vessels  . Hernia repair      hiatal hernia  . Eye surgery Bilateral   . Abdominal hysterectomy    . Fracture surgery Right     arm  . Maximum access (mas)posterior lumbar interbody fusion (plif) 1 level N/A 08/14/2013    Procedure:  MAXIMUM ACCESS SURGERY(MAS) POSTERIOR LUMBAR INTERBODY FUSION LUMBAR  THREE-FOUR ;  Surgeon: Eustace Moore, MD;  Location: [facility] NEURO ORS;  Service: Neurosurgery;  Laterality: N/A;   MAXIMUM ACCESS SURGERY(MAS) POSTERIOR LUMBAR INTERBODY FUSION LUMBAR THREE-FOUR   . Appendectomy    . Colonoscopy  11/2011    Dr. Magod:ext/int hemorrhoids, diverticulosis sigmoid colon and distal desc colon, mid-desc colon hyperplastic polyps.   . Esophagogastroduodenoscopy  11/2011    Dr. Watt Climes: small hiatal hernia, one non-bleeding superficial gastric ulcer, medium-sized diverticulum in area of papilla  . [place]ivens capsule study  11/2012    Dr. Watt Climes: minimal gastritis, normal small bowel capsule  . Esophagogastroduodenoscopy N/A 12/29/2015    Dr. Gala Romney: Chronic inactive gastritis, normal esophagus status post dilation,  duodenal diverticula  . Back surgery    . Cardiac catheterization N/A 05/01/2016    Procedure: Right/Left Heart Cath and Coronary/Graft Angiography;  Surgeon: Leonie Man, MD;  Location: [facility] CV LAB;  Service: Cardiovascular;  Laterality: N/A;    Current Medications: Outpatient Prescriptions Prior to Visit  Medication Sig Dispense Refill  . acetaminophen (TYLENOL) 325 MG tablet Take 325 mg by mouth every 6 (six) hours as needed.    Marland Kitchen albuterol (PROVENTIL HFA;VENTOLIN HFA) 108 (90 Base) MCG/ACT inhaler Take 2 puffs 3 times a day and every 4 hours as needed.    Marland Kitchen allopurinol (ZYLOPRIM) 300 MG tablet Take 300 mg by mouth daily.    Marland Kitchen amitriptyline (ELAVIL) 10 MG tablet Take 20 mg by mouth at bedtime.     Marland Kitchen aspirin EC 81 MG EC tablet Take 1 tablet (81 mg total) by mouth daily.    Marland Kitchen atorvastatin (LIPITOR) 80 MG tablet Take 80 mg by mouth daily.    Marland Kitchen docusate sodium (COLACE) 100 MG capsule Take 1 capsule (100 mg total) by mouth every 12 (twelve) hours. 60 capsule 1  . fexofenadine (ALLEGRA) 180 MG tablet Take 180 mg by mouth daily.      . fluticasone (FLONASE) 50 MCG/ACT nasal spray Place 2 sprays into both nostrils daily.    . furosemide (LASIX) 40 MG tablet Take 1 tablet (40 mg total) by mouth 2 (two) times daily. 60 tablet 11  . HYDROcodone-acetaminophen (NORCO) 7.5-325 MG tablet Take 1 tablet by mouth every 6 (six) hours as needed for moderate pain.    . hydroxychloroquine (PLAQUENIL) 200 MG tablet Take 400 mg by mouth daily.    . iron polysaccharides (NIFEREX) 150 MG capsule Take 150 mg by mouth daily.    Marland Kitchen leflunomide (ARAVA) 20 MG tablet Take 20 mg by mouth daily.    . methimazole (TAPAZOLE) 10 MG tablet Take 1 tablet (10 mg total) by mouth daily. For treatment of your thyroid disorder. 30 tablet 3  . metoCLOPramide (REGLAN) 5 MG tablet Take 10 mg by mouth at bedtime.     . montelukast (SINGULAIR) 10 MG tablet Take 10 mg by mouth at bedtime.    . Multiple Vitamins-Minerals  (MACULAR VITAMIN BENEFIT PO) Take 2 tablets by mouth daily.    . nitroGLYCERIN (NITROSTAT) 0.4 MG SL tablet Place 0.4 mg under the tongue every 5 (five) minutes as needed for chest pain. Reported on 03/24/2016    . Omega-3 Fatty Acids (FISH OIL) 1000 MG CAPS Take 1,000 mg by mouth 3 (three) times daily with meals. Reported on 03/24/2016    . omeprazole (PRILOSEC) 20 MG capsule Take 20 mg by mouth 2 (two) times daily before a meal. Reported on 03/24/2016    . Polyethyl Glycol-Propyl Glycol (SYSTANE OP) Apply 1 drop  to eye 3 (three) times daily as needed (Dry Eyes).    . polyethylene glycol (MIRALAX) packet Take 17 g by mouth daily. 60 each 1  . potassium chloride SA (K-DUR,KLOR-CON) 20 MEQ tablet Take 1 tablet (20 mEq total) by mouth 2 (two) times daily. 40 tablet 11  . sucralfate (CARAFATE) 1 GM/10ML suspension Take 10 mLs (1 g total) by mouth 4 (four) times daily. 420 mL 1  . triamcinolone cream (KENALOG) 0.1 % Apply 1 application topically daily as needed (for irritation).     . carvedilol (COREG) 12.5 MG tablet Take 1 tablet (12.5 mg total) by mouth 2 (two) times daily with a meal. 60 tablet 11  . mometasone-formoterol (DULERA) 100-5 MCG/ACT AERO Inhale 2 puffs into the lungs daily as needed for wheezing or shortness of breath. Reported on 04/29/2016    . Tapentadol HCl (NUCYNTA) 75 MG TABS Take 75 mg by mouth every 8 (eight) hours as needed (for pain). Reported on 04/29/2016     No facility-administered medications prior to visit.     Allergies:   Biaxin and Penicillins   Social History   Social History  . Marital Status: Married    Spouse Name: N/A  . Number of Children: 3  . Years of Education: N/A   Social History Main Topics  . Smoking status: Former Smoker -- 15 years    Quit date: 11/20/1994  . Smokeless tobacco: None     Comment: Quit in 1996  . Alcohol Use: No  . Drug Use: No  . Sexual Activity: Not Asked   Other Topics Concern  . None   Social History Narrative   2 living  children, one deceased age 62        Family History:  The patient's    family history includes Bladder Cancer in her brother; Cervical cancer in her daughter; Heart attack in her father and mother. There is no history of Colon cancer.   ROS:   Please see the history of present illness.    Review of Systems  Constitution: Positive for weakness and malaise/fatigue.  Cardiovascular: Positive for dyspnea on exertion and palpitations.  Respiratory: Positive for shortness of breath.   Musculoskeletal: Positive for back pain and muscle weakness.  Gastrointestinal: Positive for abdominal pain.  Genitourinary: Positive for dysuria and hesitancy.   All other systems reviewed and are negative.   PHYSICAL EXAM:   VS:  BP 126/82 mmHg  Pulse 130  Ht 5' 4.5" (1.638 m)  Wt 128 lb 9.6 oz (58.333 kg)  BMI 21.74 kg/m2  SpO2 96%  Physical Exam  GEN: Well nourished, well developed, in no acute distress Neck: no JVD, carotid bruits, or masses Cardiac:RRR; no murmurs, rubs, or gallops  Respiratory:  clear to auscultation bilaterally, normal work of breathing GI: soft, nontender, nondistended, + BS Ext: without cyanosis, clubbing, or edema, Good distal pulses bilaterally MS: no deformity or atrophy Skin: warm and dry, no rash Neuro:  Alert and Oriented x 3, Strength and sensation are intact Psych: euthymic mood, full affect  Wt Readings from Last 3 Encounters:  05/10/16 128 lb 9.6 oz (58.333 kg)  05/03/16 129 lb 9.6 oz (58.786 kg)  03/24/16 136 lb 3.2 oz (61.78 kg)      Studies/Labs Reviewed:   EKG:  EKG is  ordered today.  The ekg ordered today demonstrates Sinus tachycardia at 130 bpm  Recent Labs: 04/29/2016: ALT 13*; B Natriuretic Peptide 1552.0*; Hemoglobin 11.2*; Platelets 229; TSH 1.793 05/03/2016: BUN 12;  Creatinine, Ser 0.85; Potassium 4.4; Sodium 135   Lipid Panel    Component Value Date/Time   CHOL 112 12/19/2007 0910   TRIG 177* 12/19/2007 0910   HDL 30.2* 12/19/2007 0910    CHOLHDL 3.7 CALC 12/19/2007 0910   VLDL 35 12/19/2007 0910   LDLCALC 46 12/19/2007 0910    Additional studies/ records that were reviewed today include:  ECHO: 05/02/16 - Left ventricle: The cavity size was severely dilated. Wall   thickness was normal. Systolic function was severely reduced. The   estimated ejection fraction was in the range of 25% to 30%.   Diffuse hypokinesis. Doppler parameters are consistent with both   elevated ventricular end-diastolic filling pressure and elevated   left atrial filling pressure. - Aortic valve: There was trivial regurgitation. Valve area (VTI):   1.64 cm^2. Valve area (Vmax): 1.59 cm^2. Valve area (Vmean): 1.53 cm^2. - Mitral valve: There was mild regurgitation. - Left atrium: The atrium was moderately dilated. - Atrial septum: No defect or patent foramen ovale was identified.  Cath: 05/01/16 1. Mid LAD lesion, 100% stenosed. The lesion was previously treated with a bare metal stent greater than two years ago. 2. Sequential LIMA-D1-LAD was injected is large, and is anatomically normal. Anastomosis is distal to occluded LAD stent 3. Essentially normal right heart cath pressures with normal LVEDP Based on the evidence of widely patent LIMA graft and native arteries nongrafted, the sudden drop in EF is likely nonischemic in nature.    Right Heart Pressures Normal right heart pressures Elevated LV EDP consistent with volume overload. Borderline elevated LVEDP of 14 with PCWP of 7-   She appears to be relatively euvolemic and adequately diuresed. Plan:  Sheath removal in Cath Lab with manual pressure for hemostasis.  Return to nursing unit for continued management by primary service.  Expect that she should be ready for discharge soon, pending reevaluation by primary team.      ASSESSMENT:    1. Chronic combined systolic and diastolic CHF (congestive heart failure) ([place])   2. NICM (nonischemic cardiomyopathy) ([place])   3. Coronary artery  disease involving coronary bypass graft of native heart without angina pectoris   4. Essential hypertension   5. Sinus tachycardia ([place])   6. Dysuria      PLAN:  In order of problems listed above:  Chronic combined systolic and diastolic heart failure EF is now 25-30%. Heart failure is compensated today. Her weight is 128 pounds. I will check a renal function to see if I need to decrease her diuretic.  Nonischemic cardiomyopathy EF 25-30% which is new for her cardiac catheterization didn't demonstrate any progression of CAD please see below for details  [id number] 05/01/16 revealed an occluded native LAD at a prior stent site. She had a widely patent LIMA graft to her LAD. She had normal Rt heart pressures.  Hypertension controlled  Sinus tachycardia at 130 bpm I suspect this is due to change in her beta blocker. Increase carvedilol to 25 mg twice a day. Return Friday for an EKG. Follow-up with Dr. [name] Cancel next week here or in [place] recommend contacting Dr. Mercy Moore who treated her for a UTI prior to hospitalization.   Medication Adjustments/Labs and Tests Ordered: Current medicines are reviewed at length with the patient today.  Concerns regarding medicines are outlined above.  Medication changes, Labs and Tests ordered today are listed in the Patient Instructions below. Patient Instructions  Your physician recommends that you schedule a follow-up appointment with Dr.  [place]   Your physician has recommended you make the following change in your medication:  Increase Coreg to 25 mg Two Times Daily  Your physician recommends that you have lab work done today.  Your physician recommends that you schedule a follow-up appointment on Friday for an EKG.   Call the office if your heart rate continues to stay above 100.   Thank you for choosing [facility]!           Sumner Boast, PA-C  05/10/2016 12:50 PM    [facility]  [facility] [address], [place], [place]  [address] Phone: [phone]; Fax: [phone]

## 2016-05-12 ENCOUNTER — Ambulatory Visit (INDEPENDENT_AMBULATORY_CARE_PROVIDER_SITE_OTHER): Payer: Medicare Other

## 2016-05-12 VITALS — BP 110/64 | HR 104 | Ht 64.5 in | Wt 127.0 lb

## 2016-05-12 DIAGNOSIS — I4892 Unspecified atrial flutter: Secondary | ICD-10-CM | POA: Diagnosis not present

## 2016-05-12 NOTE — Progress Notes (Signed)
Pt came in today for an EKG. Upon arrival pt complained of dizziness and stated that she had passed out yesterday. EKG completed. Done orthostatics per verbal order from Jory Sims, N.P.  Lasix stopped per verbal order until follow up on Wed. 6/28 with Dayna Dunn.

## 2016-05-12 NOTE — Patient Instructions (Signed)
Medication Instructions:  Stop lasix ( furosemide) until next visit   Labwork: none  Testing/Procedures: none  Follow-Up: Keep your follow up  appointment with Melina Copa, P.A. On Wednesday June 28th @ 1:30.  Any Other Special Instructions Will Be Listed Below (If Applicable).     If you need a refill on your cardiac medications before your next appointment, please call your pharmacy.

## 2016-05-17 ENCOUNTER — Other Ambulatory Visit (HOSPITAL_COMMUNITY)
Admission: RE | Admit: 2016-05-17 | Discharge: 2016-05-17 | Disposition: A | Discharge status: Home / Self Care | Payer: Medicare Other | Source: Ambulatory Visit | Attending: Physician Assistant | Admitting: Physician Assistant

## 2016-05-17 ENCOUNTER — Ambulatory Visit (HOSPITAL_COMMUNITY)
Admission: RE | Admit: 2016-05-17 | Discharge: 2016-05-17 | Disposition: A | Discharge status: Home / Self Care | Payer: Medicare Other | Source: Ambulatory Visit | Attending: Physician Assistant | Admitting: Physician Assistant

## 2016-05-17 ENCOUNTER — Ambulatory Visit (INDEPENDENT_AMBULATORY_CARE_PROVIDER_SITE_OTHER): Payer: Medicare Other

## 2016-05-17 ENCOUNTER — Ambulatory Visit (INDEPENDENT_AMBULATORY_CARE_PROVIDER_SITE_OTHER): Payer: Medicare Other | Admitting: Physician Assistant

## 2016-05-17 ENCOUNTER — Encounter: Payer: Self-pay | Admitting: Physician Assistant

## 2016-05-17 ENCOUNTER — Telehealth: Payer: Self-pay | Admitting: *Deleted

## 2016-05-17 VITALS — BP 150/82 | HR 110 | Ht 64.5 in | Wt 132.0 lb

## 2016-05-17 DIAGNOSIS — I2510 Atherosclerotic heart disease of native coronary artery without angina pectoris: Secondary | ICD-10-CM | POA: Insufficient documentation

## 2016-05-17 DIAGNOSIS — I4729 Other ventricular tachycardia: Secondary | ICD-10-CM

## 2016-05-17 DIAGNOSIS — R000 Tachycardia, unspecified: Secondary | ICD-10-CM

## 2016-05-17 DIAGNOSIS — R55 Syncope and collapse: Secondary | ICD-10-CM | POA: Diagnosis not present

## 2016-05-17 DIAGNOSIS — I5022 Chronic systolic (congestive) heart failure: Secondary | ICD-10-CM

## 2016-05-17 DIAGNOSIS — I471 Supraventricular tachycardia: Secondary | ICD-10-CM

## 2016-05-17 DIAGNOSIS — I472 Ventricular tachycardia: Secondary | ICD-10-CM

## 2016-05-17 LAB — BASIC METABOLIC PANEL
Anion gap: 5 (ref 5–15)
BUN: 9 mg/dL (ref 6–20)
CO2: 24 mmol/L (ref 22–32)
Calcium: 8.6 mg/dL — ABNORMAL LOW (ref 8.9–10.3)
Chloride: 107 mmol/L (ref 101–111)
Creatinine, Ser: 0.53 mg/dL (ref 0.44–1.00)
GFR calc Af Amer: >60 mL/min (ref >60)
GFR calc non Af Amer: >60 mL/min (ref >60)
Glucose, Bld: 104 mg/dL — ABNORMAL HIGH (ref 65–99)
Potassium: 4.2 mmol/L (ref 3.5–5.1)
Sodium: 136 mmol/L (ref 135–145)

## 2016-05-17 LAB — MAGNESIUM: Magnesium: 1.7 mg/dL (ref 1.7–2.4)

## 2016-05-17 MED ORDER — IOPAMIDOL (ISOVUE-370) INJECTION 76%
100.0000 mL | Freq: Once | INTRAVENOUS | Status: AC | PRN
  Administered 2016-05-17: 100 mL via INTRAVENOUS

## 2016-05-17 MED ORDER — MAGNESIUM OXIDE 400 MG PO TABS: 400.0000 mg | ORAL_TABLET | Freq: Every day | ORAL | Status: DC

## 2016-05-17 NOTE — Progress Notes (Signed)
Cardiology Office Note    Date:  05/17/2016  ID:  Katherine Larson, DOB 07/10/1946, MRN [id number] PCP:  [name], MD  Cardiologist:  Dr. [name] Cancel Linna Hoff), EP - Dr. [name]   Chief Complaint: f/u elevated HR, syncope  History of Present Illness:  Katherine Larson is a 70 y.o. female with history of CAD s/p CABG 1996, NSVT, paroxysmal atrial tach, inappropriate sinus tachycardia, HTN, DM, HLD, chronic systolic CHF, LBBB (during 04/2016 admission), hyperthyroidism (on Tapazole), chronic back pain, chronic abdominal pain (mesenteric vascular disease but no high grade stenosis per [facility] note), rheumatoid arthritis, anemia of chronic disease +/- IDA who presents for follow-up. She previously saw Dr. [name] in 2016 because of history of atrial tachycardia. 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 He did not change medications and recommended a period of watchful waiting. More recently she was admitted 04/29/16 for chest pain and new drop in her EF. Her EKG was felt possibly atrial tach with LBBB, with SR/ST on telemetry per notes. 2D Echo 04/30/16: EF 25-30%, diffuse HK, elevated LVEDP, mild MR, mod LAE. Cath 05/01/16 revealed an occluded native LAD at a prior stent site. She had a widely patent sequential LIMA-D1-LAD. She had normal Rt heart pressures indicating adequate diuresis. It was felt that the drop in her EF was secondary to NICM. Her metoprolol was changed to carvedilol. Discharge weight 129lb. She was seen back for a TOC visit 05/10/16 at which time HR was 115-130s. Coreg was increased. F/u BMET after OV 6/21 showed K 3.5, Cr 0.68. Recent labs otherwise notable for TSH 1.79, Hgb 11 (chronically 10-11). She presented to the office for f/u EKG 05/12/16 at which time she complained of dizziness. EKG showed sinus tachycardia (reviewed EKG with Dr. Harl Bowie since the encounter is listed under flutter by nurse tech). BP 110/64, HR 104. Orthostatic BP went from  120/70 lying to 104/58 sitting with HR 102->109. She was instructed to stop Lasix.  She presents back to clinic for follow-up. She continues to feel her HR elevated. This is associated with some dyspnea. She occasionally feels a tightness on the side of her left rib. This is not worse with exertion. No LEE. No nausea, vomiting, or bleeding. On 05/11/16 (the day before recent f/u EKG), she reports an episode of syncope. She had just sat up from lying down on the couch when she apparently went out. She doesn't recall any details of what happened - her husband said her eyes were open and he had a hard time arousing her for several seconds. When she came to she felt normal. She denies any CP, SOB, or palpitations preceding the event. It has not happened since that time. She denies any dizziness today. EKG shows borderline sinus tach 100bpm with narrow complex QRS. No LEE or orthopnea.   Past Medical History  Diagnosis Date  . NSVT (nonsustained ventricular tachycardia) ([place])   . DM2 (diabetes mellitus, type 2) ([place])   . CAD (coronary artery disease)     a. s/p CABG 1996. b. low risk nuc 2011. c. LHC 04/2016 due to drop in EF -> occluded native LAD,  widely patent sequential LIMA-D1-LAD.  Marland Kitchen HTN (hypertension)   . HLD (hyperlipidemia)   . Asthma   . Neuropathy ([place])   . Complication of anesthesia   . PONV (postoperative nausea and vomiting)   . Seasonal allergies   . GERD (gastroesophageal reflux disease)   . Rheumatoid arthritis (  HCC)     RA  . Gout   . Anemia     anemia of chronic disease +/- IDA followed by Dr. Earlie Server. received iron infusions in the past  . Borderline glaucoma   . Hyperthyroidism 01/21/2016  . PAT (paroxysmal atrial tachycardia) ([place])   . Inappropriate sinus tachycardia ([place])   . Chronic systolic CHF (congestive heart failure) ([place])   . NICM (nonischemic cardiomyopathy) ([place])     a. Dx 04/2016 - EF 25-30%, diffuse HK, elevated LVEDP, mild MR, mod LAE.  Marland Kitchen LBBB (left bundle  branch block)     a. Seen in 04/2016    Past Surgical History  Procedure Laterality Date  . Cholecystectomy    . Ptca    . Coronary artery bypass graft  1996    2 vessels  . Hernia repair      hiatal hernia  . Eye surgery Bilateral   . Abdominal hysterectomy    . Fracture surgery Right     arm  . Maximum access (mas)posterior lumbar interbody fusion (plif) 1 level N/A 08/14/2013    Procedure:  MAXIMUM ACCESS SURGERY(MAS) POSTERIOR LUMBAR INTERBODY FUSION LUMBAR THREE-FOUR ;  Surgeon: Eustace Moore, MD;  Location: [facility];  Service: Neurosurgery;  Laterality: N/A;   MAXIMUM ACCESS SURGERY(MAS) POSTERIOR LUMBAR INTERBODY FUSION LUMBAR THREE-FOUR   . Appendectomy    . Colonoscopy  11/2011    Dr. Magod:ext/int hemorrhoids, diverticulosis sigmoid colon and distal desc colon, mid-desc colon hyperplastic polyps.   . Esophagogastroduodenoscopy  11/2011    Dr. Watt Climes: small hiatal hernia, one non-bleeding superficial gastric ulcer, medium-sized diverticulum in area of papilla  . Givens capsule study  11/2012    Dr. Watt Climes: minimal gastritis, normal small bowel capsule  . Esophagogastroduodenoscopy N/A 12/29/2015    Dr. Gala Romney: Chronic inactive gastritis, normal esophagus status post dilation, duodenal diverticula  . Back surgery    . Cardiac catheterization N/A 05/01/2016    Procedure: Right/Left Heart Cath and Coronary/Graft Angiography;  Surgeon: Leonie Man, MD;  Location: [facility];  Service: Cardiovascular;  Laterality: N/A;    Current Medications: Outpatient Prescriptions Prior to Visit  Medication Sig Dispense Refill  . acetaminophen (TYLENOL) 325 MG tablet Take 325 mg by mouth every 6 (six) hours as needed.    Marland Kitchen albuterol (PROVENTIL HFA;VENTOLIN HFA) 108 (90 Base) MCG/ACT inhaler Take 2 puffs 3 times a day and every 4 hours as needed.    Marland Kitchen allopurinol (ZYLOPRIM) 300 MG tablet Take 300 mg by mouth daily.    Marland Kitchen amitriptyline (ELAVIL) 10 MG tablet Take 20 mg by mouth at  bedtime.     Marland Kitchen aspirin EC 81 MG EC tablet Take 1 tablet (81 mg total) by mouth daily.    Marland Kitchen atorvastatin (LIPITOR) 80 MG tablet Take 80 mg by mouth daily.    . carvedilol (COREG) 25 MG tablet Take 1 tablet (25 mg total) by mouth 2 (two) times daily. 180 tablet 3  . docusate sodium (COLACE) 100 MG capsule Take 1 capsule (100 mg total) by mouth every 12 (twelve) hours. 60 capsule 1  . fexofenadine (ALLEGRA) 180 MG tablet Take 180 mg by mouth daily.      . fluticasone (FLONASE) 50 MCG/ACT nasal spray Place 2 sprays into both nostrils daily.    Marland Kitchen HYDROcodone-acetaminophen (NORCO) 7.5-325 MG tablet Take 1 tablet by mouth every 6 (six) hours as needed for moderate pain.    . hydroxychloroquine (PLAQUENIL) 200 MG tablet Take 400 mg  by mouth daily.    . iron polysaccharides (NIFEREX) 150 MG capsule Take 150 mg by mouth daily.    Marland Kitchen leflunomide (ARAVA) 20 MG tablet Take 20 mg by mouth daily.    . methimazole (TAPAZOLE) 10 MG tablet Take 1 tablet (10 mg total) by mouth daily. For treatment of your thyroid disorder. 30 tablet 3  . metoCLOPramide (REGLAN) 5 MG tablet Take 10 mg by mouth at bedtime.     . montelukast (SINGULAIR) 10 MG tablet Take 10 mg by mouth at bedtime.    . Multiple Vitamins-Minerals (MACULAR VITAMIN BENEFIT PO) Take 2 tablets by mouth daily.    . nitroGLYCERIN (NITROSTAT) 0.4 MG SL tablet Place 0.4 mg under the tongue every 5 (five) minutes as needed for chest pain. Reported on 03/24/2016    . Omega-3 Fatty Acids (FISH OIL) 1000 MG CAPS Take 1,000 mg by mouth 3 (three) times daily with meals. Reported on 03/24/2016    . omeprazole (PRILOSEC) 20 MG capsule Take 20 mg by mouth 2 (two) times daily before a meal. Reported on 03/24/2016    . Polyethyl Glycol-Propyl Glycol (SYSTANE OP) Apply 1 drop to eye 3 (three) times daily as needed (Dry Eyes).    . polyethylene glycol (MIRALAX) packet Take 17 g by mouth daily. 60 each 1  . potassium chloride SA (K-DUR,KLOR-CON) 20 MEQ tablet Take 1 tablet (20 mEq  total) by mouth 2 (two) times daily. 40 tablet 11  . sucralfate (CARAFATE) 1 GM/10ML suspension Take 10 mLs (1 g total) by mouth 4 (four) times daily. 420 mL 1  . triamcinolone cream (KENALOG) 0.1 % Apply 1 application topically daily as needed (for irritation).     . furosemide (LASIX) 40 MG tablet Take 1 tablet (40 mg total) by mouth 2 (two) times daily. (Patient taking differently: Take 20 mg by mouth 2 (two) times daily. ) 60 tablet 11   No facility-administered medications prior to visit.     Allergies:   Biaxin and Penicillins   Social History   Social History  . Marital Status: Married    Spouse Name: N/A  . Number of Children: 3  . Years of Education: N/A   Social History Main Topics  . Smoking status: Former Smoker -- 15 years    Quit date: 11/20/1994  . Smokeless tobacco: None     Comment: Quit in 1996  . Alcohol Use: No  . Drug Use: No  . Sexual Activity: Not Asked   Other Topics Concern  . None   Social History Narrative   2 living children, one deceased age 5        Family History:  The patient's family history includes Bladder Cancer in her brother; Cervical cancer in her daughter; Heart attack in her father and mother. There is no history of Colon cancer.   ROS:   Please see the history of present illness.  All other systems are reviewed and otherwise negative.    PHYSICAL EXAM:   VS:  BP 150/82 mmHg  Pulse 110  Ht 5' 4.5" (1.638 m)  Wt 132 lb (59.875 kg)  BMI 22.32 kg/m2  SpO2 95%  BMI: Body mass index is 22.32 kg/(m^2). GEN: Well nourished, well developed WF, in no acute distress HEENT: normocephalic, atraumatic Neck: no JVD, carotid bruits, or masses Cardiac: RRR borderline tachycardic; no murmurs, rubs, or gallops, no edema  Respiratory:  clear to auscultation bilaterally, normal work of breathing GI: soft, nontender, nondistended, + BS MS: no deformity  or atrophy Skin: warm and dry, no rash Neuro:  Alert and Oriented x 3, Strength and  sensation are intact, follows commands Psych: euthymic mood, full affect  Wt Readings from Last 3 Encounters:  05/17/16 132 lb (59.875 kg)  05/12/16 127 lb (57.607 kg)  05/10/16 128 lb 9.6 oz (58.333 kg)      Studies/Labs Reviewed:   EKG:  EKG was ordered today and personally reviewed by me and demonstratesNSR 100bpm, QTc 472ms, QRs 73ms, nonspecific ST-T changes  Recent Labs: 04/29/2016: ALT 13*; B Natriuretic Peptide 1552.0*; Hemoglobin 11.2*; Platelets 229; TSH 1.793 05/10/2016: BUN 15; Creatinine, Ser 0.68; Potassium 3.5; Sodium 135   Lipid Panel    Component Value Date/Time   CHOL 112 12/19/2007 0910   TRIG 177* 12/19/2007 0910   HDL 30.2* 12/19/2007 0910   CHOLHDL 3.7 CALC 12/19/2007 0910   VLDL 35 12/19/2007 0910   LDLCALC 46 12/19/2007 0910    Additional studies/ records that were reviewed today include: Summarized above.    ASSESSMENT & PLAN:   1. Syncope - occurred after sitting up from lying down position. She did not seek care the day this occurred. Orthostatic vital signs taken the following day showed a drop in BP from 123456 systolic; Lasix stopped. Could represent orthostatic reaction but certainly concerning in a patient with intermittent LBBB and low EF. Will place 30 day monitor. I advised her not to drive for at least 6 months pending further evaluation. Will refer back to EP ASAP for their input. Check BMET/Mg since Lasix was stopped and she remains on potassium. Given her persistently elevated HR, complaints of SOB, and recently elevated d-dimer, will also check CT angio stat to exclude PE. 2. Chronic systolic CHF/NICM - continue to hold Lasix for now. BP is slightly elevated but given recent syncope and possible orthostasis I do not wish to advance any medications right now. Will defer to EP to weigh in on when ICD would be warranted. 3. Inappropriate sinus tachycardia - await EP input. 4. Paroxysmal atrial tach - will evaluate by monitor.  5. CAD -  continue ASA, BB, statin for now. Recent cath as above. 6. NSVT - check lytes as above and continue BB.  Disposition: F/u with Dr. [name] ASAP - if no availability within the next week, will try to schedule with EP APP on a day when Dr. [name] is in the office.   Medication Adjustments/Labs and Tests Ordered: Current medicines are reviewed at length with the patient today.  Concerns regarding medicines are outlined above. Medication changes, Labs and Tests ordered today are summarized above and listed in the Patient Instructions accessible in Encounters.   Signed, Melina Copa PA-C  05/17/2016 2:06 PM    [facility] in [place], [place] [address] Ph: [phone]; Fax [phone]

## 2016-05-17 NOTE — Patient Instructions (Signed)
You have been referred to Electrophysiology (Dr. [name]).  Your physician recommends that you continue on your current medications as directed. Please refer to the Current Medication list given to you today.  Your physician recommends that you have lab work today.  Your physician has recommended that you wear an event monitor. Event monitors are medical devices that record the heart's electrical activity. Doctors most often [place] these monitors to diagnose arrhythmias. Arrhythmias are problems with the speed or rhythm of the heartbeat. The monitor is a small, portable device. You can wear one while you do your normal daily activities. This is usually used to diagnose what is causing palpitations/syncope (passing out).  If you need a refill on your cardiac medications before your next appointment, please call your pharmacy.  Thank you for choosing [facility]!

## 2016-05-17 NOTE — Telephone Encounter (Signed)
-----   Message from Charlie Pitter, [place] sent at 05/17/2016  4:08 PM EDT ----- Please have patient start MagOx 400mg  daily. Mg borderline low. (sending to [place] in [place] who I am working with today) Melina Copa PA-C

## 2016-05-19 ENCOUNTER — Ambulatory Visit (INDEPENDENT_AMBULATORY_CARE_PROVIDER_SITE_OTHER): Payer: Medicare Other | Admitting: Internal Medicine

## 2016-05-19 ENCOUNTER — Encounter: Payer: Self-pay | Admitting: Internal Medicine

## 2016-05-19 VITALS — BP 122/78 | HR 96 | Ht 64.0 in | Wt 134.0 lb

## 2016-05-19 DIAGNOSIS — I5040 Unspecified combined systolic (congestive) and diastolic (congestive) heart failure: Secondary | ICD-10-CM | POA: Diagnosis not present

## 2016-05-19 NOTE — Progress Notes (Signed)
HPI Katherine Larson returns today for evaluation of SVT and CHF. She has multiple medical problems but was in the hospital several weeks ago with CHF and found to have LV dysfunction which is new. She had a heart cath which showed patent LIMA to LAD. She has been on medical therapy though no ARB or ACE. She has severe back pain and is undergoing evaluation and is pending a neural stimulator placement.  Allergies  Allergen Reactions  . Biaxin [Clarithromycin] Rash and Other (See Comments)    Blisters in mouth   . Penicillins Rash and Other (See Comments)    Blisters in mouth Has patient had a PCN reaction causing immediate rash, facial/tongue/throat swelling, SOB or lightheadedness with hypotension: [place] Has patient had a PCN reaction causing severe rash involving mucus membranes or skin necrosis: Nono Has patient had a PCN reaction that required hospitalization Nono Has patient had a PCN reaction occurring within the last 10 years: Nono If all of the above answers are "NO", then may proceed     Current Outpatient Prescriptions  Medication Sig Dispense Refill  . acetaminophen (TYLENOL) 325 MG tablet Take 325 mg by mouth every 6 (six) hours as needed.    Marland Kitchen albuterol (PROVENTIL HFA;VENTOLIN HFA) 108 (90 Base) MCG/ACT inhaler Take 2 puffs 3 times a day and every 4 hours as needed.    Marland Kitchen allopurinol (ZYLOPRIM) 300 MG tablet Take 300 mg by mouth daily.    Marland Kitchen amitriptyline (ELAVIL) 10 MG tablet Take 20 mg by mouth at bedtime.     Marland Kitchen aspirin EC 81 MG EC tablet Take 1 tablet (81 mg total) by mouth daily.    Marland Kitchen atorvastatin (LIPITOR) 80 MG tablet Take 80 mg by mouth daily.    . carvedilol (COREG) 25 MG tablet Take 1 tablet (25 mg total) by mouth 2 (two) times daily. 180 tablet 3  . docusate sodium (COLACE) 100 MG capsule Take 1 capsule (100 mg total) by mouth every 12 (twelve) hours. 60 capsule 1  . fexofenadine (ALLEGRA) 180 MG tablet Take 180 mg by mouth daily.      . fluticasone (FLONASE) 50 MCG/ACT  nasal spray Place 2 sprays into both nostrils daily.    . furosemide (LASIX) 20 MG tablet Take 20 mg by mouth 2 (two) times daily.    Marland Kitchen HYDROcodone-acetaminophen (NORCO) 7.5-325 MG tablet Take 1 tablet by mouth every 6 (six) hours as needed for moderate pain.    . hydroxychloroquine (PLAQUENIL) 200 MG tablet Take 400 mg by mouth daily.    . iron polysaccharides (NIFEREX) 150 MG capsule Take 150 mg by mouth daily.    Marland Kitchen leflunomide (ARAVA) 20 MG tablet Take 20 mg by mouth daily.    . magnesium oxide (MAG-OX) 400 MG tablet Take 1 tablet (400 mg total) by mouth daily. 30 tablet 11  . methimazole (TAPAZOLE) 10 MG tablet Take 1 tablet (10 mg total) by mouth daily. For treatment of your thyroid disorder. 30 tablet 3  . metoCLOPramide (REGLAN) 5 MG tablet Take 10 mg by mouth at bedtime.     . montelukast (SINGULAIR) 10 MG tablet Take 10 mg by mouth at bedtime.    . Multiple Vitamins-Minerals (MACULAR VITAMIN BENEFIT PO) Take 2 tablets by mouth daily.    . nitroGLYCERIN (NITROSTAT) 0.4 MG SL tablet Place 0.4 mg under the tongue every 5 (five) minutes as needed for chest pain. Reported on 03/24/2016    . Omega-3 Fatty Acids (FISH OIL) 1000 MG CAPS Take 1,000  mg by mouth 3 (three) times daily with meals. Reported on 03/24/2016    . omeprazole (PRILOSEC) 20 MG capsule Take 20 mg by mouth 2 (two) times daily before a meal. Reported on 03/24/2016    . Polyethyl Glycol-Propyl Glycol (SYSTANE OP) Apply 1 drop to eye 3 (three) times daily as needed (Dry Eyes).    . polyethylene glycol (MIRALAX) packet Take 17 g by mouth daily. 60 each 1  . potassium chloride SA (K-DUR,KLOR-CON) 20 MEQ tablet Take 1 tablet (20 mEq total) by mouth 2 (two) times daily. 40 tablet 11  . sucralfate (CARAFATE) 1 GM/10ML suspension Take 10 mLs (1 g total) by mouth 4 (four) times daily. 420 mL 1  . triamcinolone cream (KENALOG) 0.1 % Apply 1 application topically daily as needed (for irritation).     . [DISCONTINUED] dexlansoprazole (DEXILANT)  60 MG capsule Take 1 capsule (60 mg total) by mouth daily. (Patient not taking: Reported on 01/08/2016) 30 capsule 5  . [DISCONTINUED] Linaclotide (LINZESS) 290 MCG CAPS capsule Take 1 capsule (290 mcg total) by mouth daily. (Patient not taking: Reported on 01/08/2016) 90 capsule 1   No current facility-administered medications for this visit.     Past Medical History  Diagnosis Date  . NSVT (nonsustained ventricular tachycardia) ([place])   . DM2 (diabetes mellitus, type 2) ([place])   . CAD (coronary artery disease)     a. s/p CABG 1996. b. low risk nuc 2011. c. LHC 04/2016 due to drop in EF -> occluded native LAD,  widely patent sequential LIMA-D1-LAD.  Marland Kitchen HTN (hypertension)   . HLD (hyperlipidemia)   . Asthma   . Neuropathy ([place])   . Complication of anesthesia   . PONV (postoperative nausea and vomiting)   . Seasonal allergies   . GERD (gastroesophageal reflux disease)   . Rheumatoid arthritis (HCC)     RA  . Gout   . Anemia     anemia of chronic disease +/- IDA followed by Dr. Earlie Server. received iron infusions in the past  . Borderline glaucoma   . Hyperthyroidism 01/21/2016  . PAT (paroxysmal atrial tachycardia) ([place])   . Inappropriate sinus tachycardia ([place])   . Chronic systolic CHF (congestive heart failure) ([place])   . NICM (nonischemic cardiomyopathy) ([place])     a. Dx 04/2016 - EF 25-30%, diffuse HK, elevated LVEDP, mild MR, mod LAE.  Marland Kitchen LBBB (left bundle branch block)     a. Seen in 04/2016    ROS:   All systems reviewed and negative except as noted in the HPI.   Past Surgical History  Procedure Laterality Date  . Cholecystectomy    . Ptca    . Coronary artery bypass graft  1996    2 vessels  . Hernia repair      hiatal hernia  . Eye surgery Bilateral   . Abdominal hysterectomy    . Fracture surgery Right     arm  . Maximum access (mas)posterior lumbar interbody fusion (plif) 1 level N/A 08/14/2013    Procedure:  MAXIMUM ACCESS SURGERY(MAS) POSTERIOR LUMBAR INTERBODY  FUSION LUMBAR THREE-FOUR ;  Surgeon: Eustace Moore, MD;  Location: [facility];  Service: Neurosurgery;  Laterality: N/A;   MAXIMUM ACCESS SURGERY(MAS) POSTERIOR LUMBAR INTERBODY FUSION LUMBAR THREE-FOUR   . Appendectomy    . Colonoscopy  11/2011    Dr. Magod:ext/int hemorrhoids, diverticulosis sigmoid colon and distal desc colon, mid-desc colon hyperplastic polyps.   . Esophagogastroduodenoscopy  11/2011    Dr. Watt Climes: small  hiatal hernia, one non-bleeding superficial gastric ulcer, medium-sized diverticulum in area of papilla  . Givens capsule study  11/2012    Dr. Watt Climes: minimal gastritis, normal small bowel capsule  . Esophagogastroduodenoscopy N/A 12/29/2015    Dr. Gala Romney: Chronic inactive gastritis, normal esophagus status post dilation, duodenal diverticula  . Back surgery    . Cardiac catheterization N/A 05/01/2016    Procedure: Right/Left Heart Cath and Coronary/Graft Angiography;  Surgeon: Leonie Man, MD;  Location: [facility];  Service: Cardiovascular;  Laterality: N/A;     Family History  Problem Relation Age of Onset  . Heart attack Father   . Heart attack Mother   . Colon cancer Neg Hx   . Bladder Cancer Brother   . Cervical cancer Daughter     ?     Social History   Social History  . Marital Status: Married    Spouse Name: N/A  . Number of Children: 3  . Years of Education: N/A   Occupational History  . Not on file.   Social History Main Topics  . Smoking status: Former Smoker -- 15 years    Quit date: 11/20/1994  . Smokeless tobacco: Not on file     Comment: Quit in 1996  . Alcohol Use: No  . Drug Use: No  . Sexual Activity: Not on file   Other Topics Concern  . Not on file   Social History Narrative   2 living children, one deceased age 55        BP 122/78 mmHg  Pulse 96  Ht 5\' 4"  (1.626 m)  Wt 134 lb (60.782 kg)  BMI 22.99 kg/m2  SpO2 98%  Physical Exam:  Well appearing 70 yo woman, NAD HEENT: Unremarkable Neck:  7 cm JVD, no  thyromegally Back:  No CVA tenderness Lungs:  Clear with no wheezes, rales, or rhonchi. HEART:  Regular rate rhythm, no murmurs, no rubs, no clicks Abd:  soft, positive bowel sounds, no organomegally, no rebound, no guarding Ext:  2 plus pulses, no edema, no cyanosis, no clubbing Skin:  No rashes no nodules Neuro:  CN II through XII intact, motor grossly intact   Assess/Plan: 1. Chronic systolic heart failure - she was found to have severe LV dysfunction at her hospitalization several weeks ago. She is on medical therapy and her symptoms are stable class 2.  We will plan to repeat her echo in 4 months. 2. Atrial tachycardia - in the past this has been made worse by pain. She is currently wearing a cardiac monitor. Will make a disposition on her arrhythmias after she finishes the monitor. 3. Back pain - she is pending stimulator placement.  4. Diarrhea - this is chronic but appears to be stable. I suspect the norco is helping this.  Katherine Larson.D.

## 2016-05-19 NOTE — Patient Instructions (Signed)
Your physician wants you to follow-up in: Nov. With Dr. [name]. You will receive a reminder letter in the mail two months in advance. If you don't receive a letter, please call our office to schedule the follow-up appointment.  Your physician has requested that you have an echocardiogram. Echocardiography is a painless test that uses sound waves to create images of your heart. It provides your doctor with information about the size and shape of your heart and how well your heart's chambers and valves are working. This procedure takes approximately one hour. There are no restrictions for this procedure.  Your physician recommends that you continue on your current medications as directed. Please refer to the Current Medication list given to you today.  If you need a refill on your cardiac medications before your next appointment, please call your pharmacy.  Thank you for choosing [facility]!

## 2016-05-20 HISTORY — PX: OTHER SURGICAL HISTORY: SHX169

## 2016-05-29 ENCOUNTER — Other Ambulatory Visit: Payer: Self-pay | Admitting: Gastroenterology

## 2016-05-29 ENCOUNTER — Other Ambulatory Visit: Payer: Self-pay | Admitting: Cardiovascular Disease

## 2016-06-01 ENCOUNTER — Other Ambulatory Visit: Payer: Self-pay | Admitting: Anesthesiology

## 2016-06-02 ENCOUNTER — Telehealth: Payer: Self-pay | Admitting: Internal Medicine

## 2016-06-02 NOTE — Telephone Encounter (Signed)
Husband concerned that if patient has spinal stimulator it may effect any future ICD placement patient needs. I reassured him all issues would be addressed by Dr [name] if that were to be the case

## 2016-06-02 NOTE — Telephone Encounter (Signed)
pls call the pt's husband concerning an upcoming procedure, he has some questions concerning possible pacemaker

## 2016-06-04 ENCOUNTER — Other Ambulatory Visit: Payer: Self-pay | Admitting: Gastroenterology

## 2016-06-06 ENCOUNTER — Encounter: Payer: Self-pay | Admitting: Internal Medicine

## 2016-06-08 NOTE — Pre-Procedure Instructions (Signed)
Katherine Larson  06/08/2016      [id number] - [place] AT [address] [place] [address] Phone: [phone] Fax: [phone]  [facility], [place] - [address] AT [address] [place] [address] Phone: [phone] Fax: [phone]    Your procedure is scheduled on Friday, July 28.   Report to [facility] Admitting at 7:30 A.M.   Call this number if you have problems the morning of surgery:  [phone]   Remember:  Do not eat food or drink liquids after midnight.  Take these medicines the morning of surgery with A SIP OF WATER: acetaminophen (tylenol), albuterol if needed (please bring inhaler to hospital with you), allopurinol (zyloprim), amitriptyline (elavil), carvedilol (coreg), flonase if needed, hydrocodone (norco) if needed, leflunomide (Arava), methimazole (Tapazole), omeprazole (prilosec)  7 days prior to surgery STOP taking any Aspirin, Aleve, Naproxen, Ibuprofen, Motrin, Advil, Goody's, BC's, all herbal medications, fish oil, and all vitamins    Do not wear jewelry, make-up or nail polish.  Do not wear lotions, powders, or perfumes.  You may NOT wear deoderant.  Do not shave 48 hours prior to surgery.  Men may shave face and neck.  Do not bring valuables to the hospital.  [facility] is not responsible for any belongings or valuables.  Contacts, dentures or bridgework may not be worn into surgery.  Leave your suitcase in the car.  After surgery it may be brought to your room.  For patients admitted to the hospital, discharge time will be determined by your treatment team.  Patients discharged the day of surgery will not be allowed to drive home.    Special instructions:    [facility]- Preparing For Surgery  Before surgery, you can play an important role. Because skin is not sterile, your skin needs to be as free of  germs as possible. You can reduce the number of germs on your skin by washing with CHG (chlorahexidine gluconate) Soap before surgery.  CHG is an antiseptic cleaner which kills germs and bonds with the skin to continue killing germs even after washing.  Please do not use if you have an allergy to CHG or antibacterial soaps. If your skin becomes reddened/irritated stop using the CHG.  Do not shave (including legs and underarms) for at least 48 hours prior to first CHG shower. It is OK to shave your face.  Please follow these instructions carefully.   1. Shower the NIGHT BEFORE SURGERY and the MORNING OF SURGERY with CHG.   2. If you chose to wash your hair, wash your hair first as usual with your normal shampoo.  3. After you shampoo, rinse your hair and body thoroughly to remove the shampoo.  4. Use CHG as you would any other liquid soap. You can apply CHG directly to the skin and wash gently with a scrungie or a clean washcloth.   5. Apply the CHG Soap to your body ONLY FROM THE NECK DOWN.  Do not use on open wounds or open sores. Avoid contact with your eyes, ears, mouth and genitals (private parts). Wash genitals (private parts) with your normal soap.  6. Wash thoroughly, paying special attention to the area where your surgery will be performed.  7. Thoroughly rinse your body with warm water from the neck down.  8. DO NOT shower/wash with your  normal soap after using and rinsing off the CHG Soap.  9. Pat yourself dry with a CLEAN TOWEL.   10. Wear CLEAN PAJAMAS   11. Place CLEAN SHEETS on your bed the night of your first shower and DO NOT SLEEP WITH PETS.    Day of Surgery: Do not apply any deodorants/lotions. Please wear clean clothes to the hospital/surgery center.

## 2016-06-09 ENCOUNTER — Encounter (HOSPITAL_COMMUNITY): Payer: Self-pay

## 2016-06-09 ENCOUNTER — Encounter (HOSPITAL_COMMUNITY)
Admission: RE | Admit: 2016-06-09 | Discharge: 2016-06-09 | Disposition: A | Discharge status: Home / Self Care | Payer: Medicare Other | Source: Ambulatory Visit | Attending: Anesthesiology | Admitting: Anesthesiology

## 2016-06-09 DIAGNOSIS — I10 Essential (primary) hypertension: Secondary | ICD-10-CM | POA: Insufficient documentation

## 2016-06-09 DIAGNOSIS — E785 Hyperlipidemia, unspecified: Secondary | ICD-10-CM | POA: Diagnosis not present

## 2016-06-09 DIAGNOSIS — Z951 Presence of aortocoronary bypass graft: Secondary | ICD-10-CM | POA: Diagnosis not present

## 2016-06-09 DIAGNOSIS — Z01818 Encounter for other preprocedural examination: Secondary | ICD-10-CM | POA: Diagnosis present

## 2016-06-09 DIAGNOSIS — Z981 Arthrodesis status: Secondary | ICD-10-CM | POA: Diagnosis not present

## 2016-06-09 DIAGNOSIS — K219 Gastro-esophageal reflux disease without esophagitis: Secondary | ICD-10-CM | POA: Diagnosis not present

## 2016-06-09 DIAGNOSIS — M545 Low back pain: Secondary | ICD-10-CM | POA: Insufficient documentation

## 2016-06-09 DIAGNOSIS — Z01812 Encounter for preprocedural laboratory examination: Secondary | ICD-10-CM | POA: Diagnosis not present

## 2016-06-09 DIAGNOSIS — E119 Type 2 diabetes mellitus without complications: Secondary | ICD-10-CM | POA: Insufficient documentation

## 2016-06-09 DIAGNOSIS — I2510 Atherosclerotic heart disease of native coronary artery without angina pectoris: Secondary | ICD-10-CM | POA: Diagnosis not present

## 2016-06-09 DIAGNOSIS — Z87891 Personal history of nicotine dependence: Secondary | ICD-10-CM | POA: Insufficient documentation

## 2016-06-09 DIAGNOSIS — I428 Other cardiomyopathies: Secondary | ICD-10-CM | POA: Insufficient documentation

## 2016-06-09 DIAGNOSIS — M069 Rheumatoid arthritis, unspecified: Secondary | ICD-10-CM | POA: Insufficient documentation

## 2016-06-09 HISTORY — DX: Personal history of other diseases of the digestive system: Z87.19

## 2016-06-09 HISTORY — DX: Acute myocardial infarction, unspecified: I21.9

## 2016-06-09 HISTORY — DX: Personal history of pneumonia (recurrent): Z87.01

## 2016-06-09 HISTORY — DX: Chronic kidney disease, unspecified: N18.9

## 2016-06-09 LAB — BASIC METABOLIC PANEL
Anion gap: 5 (ref 5–15)
BUN: 8 mg/dL (ref 6–20)
CO2: 26 mmol/L (ref 22–32)
Calcium: 9.1 mg/dL (ref 8.9–10.3)
Chloride: 109 mmol/L (ref 101–111)
Creatinine, Ser: 0.56 mg/dL (ref 0.44–1.00)
GFR calc Af Amer: >60 mL/min (ref >60)
GFR calc non Af Amer: >60 mL/min (ref >60)
Glucose, Bld: 98 mg/dL (ref 65–99)
Potassium: 3.7 mmol/L (ref 3.5–5.1)
Sodium: 140 mmol/L (ref 135–145)

## 2016-06-09 LAB — CBC
HCT: 35.9 % — ABNORMAL LOW (ref 36.0–46.0)
Hemoglobin: 11.1 g/dL — ABNORMAL LOW (ref 12.0–15.0)
MCH: 29.9 pg (ref 26.0–34.0)
MCHC: 30.9 g/dL (ref 30.0–36.0)
MCV: 96.8 fL (ref 78.0–100.0)
Platelets: 184 10*3/uL (ref 150–400)
RBC: 3.71 MIL/uL — ABNORMAL LOW (ref 3.87–5.11)
RDW: 14.3 % (ref 11.5–15.5)
WBC: 6 10*3/uL (ref 4.0–10.5)

## 2016-06-09 LAB — SURGICAL PCR SCREEN
MRSA, PCR: NEGATIVE
Staphylococcus aureus: NEGATIVE

## 2016-06-09 LAB — GLUCOSE, CAPILLARY: Glucose-Capillary: 122 mg/dL — ABNORMAL HIGH (ref 65–99)

## 2016-06-09 NOTE — Progress Notes (Addendum)
PCP: Harlan Stains, MD at [facility] Cardiologist: Dr. [name] Pt recently admitted for new CHF CXR: 04/29/16 Chest CT: 05/17/16 Echo: 04/30/16 Cath: 05/01/16  Willeen Cass notified of pt recent admission. Pt states she has not had any recent chest pain and has not felt her heart race since her last visit with Dr. [name]. Pt is currently wearing event monitor. To send back via mail on Thursday July 27 per pt.   Pt last A1c requested from MD and pt educated to check CBG 2 days prior to procedure 4x/day to make sure CBG is not greater than 250 or less than 70. Pt verbalized understanding.

## 2016-06-09 NOTE — Progress Notes (Signed)
   How to Manage Your Diabetes Before and After Surgery  Why is it important to control my blood sugar before and after surgery? . Improving blood sugar levels before and after surgery helps healing and can limit problems. . A way of improving blood sugar control is eating a healthy diet by: o  Eating less sugar and carbohydrates o  Increasing activity/exercise o  Talking with your doctor about reaching your blood sugar goals . High blood sugars (greater than 180 mg/dL) can raise your risk of infections and slow your recovery, so you will need to focus on controlling your diabetes during the weeks before surgery. . Make sure that the doctor who takes care of your diabetes knows about your planned surgery including the date and location.  How do I manage my blood sugar before surgery? . Check your blood sugar at least 4 times a day, starting 2 days before surgery, to make sure that the level is not too high or low. o Check your blood sugar the morning of your surgery when you wake up and every 2 hours until you get to the Short Stay unit. . If your blood sugar is less than 70 mg/dL, you will need to treat for low blood sugar: o Do not take insulin. o Treat a low blood sugar (less than 70 mg/dL) with  cup of clear juice (cranberry or apple), 4 glucose tablets, OR glucose gel. o Recheck blood sugar in 15 minutes after treatment (to make sure it is greater than 70 mg/dL). If your blood sugar is not greater than 70 mg/dL on recheck, call [phone] for further instructions. . Report your blood sugar to the short stay nurse when you get to Short Stay.  . If you are admitted to the hospital after surgery: o Your blood sugar will be checked by the staff and you will probably be given insulin after surgery (instead of oral diabetes medicines) to make sure you have good blood sugar levels. o The goal for blood sugar control after surgery is 80-180 mg/dL.           

## 2016-06-09 NOTE — Progress Notes (Signed)
Anesthesia Chart Review:  Pt is a 70 year old female scheduled for lumbar spinal cord stimulator insertion on 06/16/2016 with Clydell Hakim, MD.   EP cardiologist is Cristopher Peru, MD, who is aware pt is pending for this procedure.   Cardiologist is Jenkins Rouge, MD, last office visit with Melina Copa, PA on 05/17/16.  Pt reported syncope, 30 day heart monitor ordered. Pt scheduled to turn it in 06/15/16. At prior cards visit 05/10/16 with Ermalinda Barrios, PA, pt had reported tachycardia and her carvedilol was increased.   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 Former smoker BMI 23.5. S/p PLIF 08/14/13  Pt hospitalized 6/10-6/14/17 for acute combined systolic and diastolic HF, newly identified nonischemic cardiomyopathy (EF 25-30%). 12 lbs diuresed off of patient during hospital stay.   Pt reported at PAT 06/09/16 that she has had no further tachycardia/palpitations since 05/17/16, has been weighing herself daily and denies weight gain, denies peripheral edema, denies SOB except her usual SOB with exertion, denies syncope or dizziness since one episode documented 05/17/16  Preoperative labs reviewed.    Chest x-ray 04/29/16 reviewed. Small bilateral pleural effusions. Vascular congestion and mild cardiomegaly, with increased interstitial markings, concerning for mild pulmonary edema.  CT angio chest 05/17/16: No pulmonary embolus. No acute abnormality identified.  EKG 05/17/16: Sinus  Rhythm. Old anteroseptal infarct. Nonspecific T-abnormality.   Right and left cardiac cath 05/01/16:  1. Mid LAD lesion, 100% stenosed. The lesion was previously treated with a bare metal stent greater than two years ago. 2. Sequential LIMA-D1-LAD was injected is large, and is anatomically normal. Anastomosis is distal to occluded LAD  stent 3. Essentially normal right heart cath pressures with normal LVEDP  Echo 04/30/16:  - Left ventricle: The cavity size was severely dilated. Wall thickness was normal. Systolic function was severely reduced. The estimated ejection fraction was in the range of 25% to 30%. Diffuse hypokinesis. Doppler parameters are consistent with both elevated ventricular end-diastolic filling pressure and elevated left atrial filling pressure. - Aortic valve: There was trivial regurgitation. Valve area (VTI): 1.64 cm^2. Valve area (Vmax): 1.59 cm^2. Valve area (Vmean): 1.53cm^2. - Mitral valve: There was mild regurgitation. - Left atrium: The atrium was moderately dilated. - Atrial septum: No defect or patent foramen ovale was identified.  Reviewed case with Dr. Orene Desanctis.   If no changes, I anticipate pt can proceed with surgery as scheduled.   Willeen Cass, FNP-BC [facility]/Anesthesiology Phone: [phone] 06/09/2016 4:29 PM

## 2016-06-12 ENCOUNTER — Telehealth: Payer: Self-pay | Admitting: Internal Medicine

## 2016-06-12 NOTE — Telephone Encounter (Signed)
New Message  Pt husband call requesting to speak with RN about pt upcoming surgery with some concerns. Please call back to discuss

## 2016-06-13 NOTE — Telephone Encounter (Signed)
Spoke with Janett Billow at [facility]. Notified her that pt has questions regarding upcoming surgery. Janett Billow states that she will call her now.

## 2016-06-13 NOTE — Progress Notes (Signed)
Error. See note from Kabbe, FNP-BC.  [facility] Short Stay Center/Anesthesiology Phone [phone] 06/13/2016 5:19 PM

## 2016-06-15 MED ORDER — VANCOMYCIN HCL IN DEXTROSE 1-5 GM/200ML-% IV SOLN
1000.0000 mg | INTRAVENOUS | Status: AC
  Administered 2016-06-16: 1000 mg via INTRAVENOUS
  Filled 2016-06-15: qty 200

## 2016-06-15 NOTE — H&P (Signed)
Katherine Larson is an 70 y.o. female.   Chief Complaint: back pain with radiation into the lower extremities HPI: 70 year old woman with a past medical history of lumbarspondylolisthesis and stenosis.  She underwent surgical decompression at L3 L4, with fusion, in 2014.  Since that time, however, she is continued to have back pain, with radiation into the lower extremities.  She has failed more conservative interventional approaches at  managing her residual postlaminectomy syndrome type symptoms, and has demonstrated either poor tolerance to, or side effects from, multiple medication trials.  It was thus felt that a trial of SCS therapy might be beneficial for her.  She underwent SCS trial recently, and had an 80% or greater improvement in her overall symptoms.  She was able to reduce her medication by half.  She increased her overall activity levels substantially.  She was quite pleased with the outcome of her trial.  She is now scheduled for permanent implantation.  Past Medical History:  Diagnosis Date  . Anemia    anemia of chronic disease +/- IDA followed by Dr. Earlie Server. received iron infusions in the past  . Asthma   . Borderline glaucoma   . CAD (coronary artery disease)    a. s/p CABG 1996. b. low risk nuc 2011. c. LHC 04/2016 due to drop in EF -> occluded native LAD,  widely patent sequential LIMA-D1-LAD.  Marland Kitchen Chronic kidney disease    "mild stage of kidney disease"  . Chronic systolic CHF (congestive heart failure) ([place])   . Complication of anesthesia   . DM2 (diabetes mellitus, type 2) ([place])    not on any medicine for this at this time, 05/2016  . GERD (gastroesophageal reflux disease)   . Gout   . History of hiatal hernia    in 35s  . History of pneumonia    March, 2016  . HLD (hyperlipidemia)   . HTN (hypertension)   . Hyperthyroidism 01/21/2016  . Inappropriate sinus tachycardia ([place])   . LBBB (left bundle branch block)    a. Seen in 04/2016  . Myocardial infarction ([place])    1996  . Neuropathy ([place])   . NICM (nonischemic cardiomyopathy) ([place])    a. Dx 04/2016 - EF 25-30%, diffuse HK, elevated LVEDP, mild MR, mod LAE.  Marland Kitchen NSVT (nonsustained ventricular tachycardia) ([place])   . PAT (paroxysmal atrial tachycardia) ([place])   . PONV (postoperative nausea and vomiting)    "after just about every surgery I've had"  . Rheumatoid arthritis ([facility])    RA  . Seasonal allergies     Past Surgical History:  Procedure Laterality Date  . ABDOMINAL HYSTERECTOMY    . APPENDECTOMY    . BACK SURGERY    . CARDIAC CATHETERIZATION N/A 05/01/2016   Procedure: Right/Left Heart Cath and Coronary/Graft Angiography;  Surgeon: Leonie Man, MD;  Location: [facility];  Service: Cardiovascular;  Laterality: N/A;  . CHOLECYSTECTOMY    . COLONOSCOPY  11/2011   Dr. Magod:ext/int hemorrhoids, diverticulosis sigmoid colon and distal desc colon, mid-desc colon hyperplastic polyps.   . CORONARY ARTERY BYPASS GRAFT  1996   2 vessels  . ESOPHAGOGASTRODUODENOSCOPY  11/2011   Dr. Watt Climes: small hiatal hernia, one non-bleeding superficial gastric ulcer, medium-sized diverticulum in area of papilla  . ESOPHAGOGASTRODUODENOSCOPY N/A 12/29/2015   Dr. Gala Romney: Chronic inactive gastritis, normal esophagus status post dilation, duodenal diverticula  . EYE SURGERY Bilateral   . FRACTURE SURGERY Right    arm  . GIVENS CAPSULE STUDY  11/2012  Dr. Watt Climes: minimal gastritis, normal small bowel capsule  . HERNIA REPAIR     hiatal hernia  . MAXIMUM ACCESS (MAS)POSTERIOR LUMBAR INTERBODY FUSION (PLIF) 1 LEVEL N/A 08/14/2013   Procedure:  MAXIMUM ACCESS SURGERY(MAS) POSTERIOR LUMBAR INTERBODY FUSION LUMBAR THREE-FOUR ;  Surgeon: Eustace Moore, MD;  Location: [facility];  Service: Neurosurgery;  Laterality: N/A;   MAXIMUM ACCESS SURGERY(MAS) POSTERIOR LUMBAR INTERBODY FUSION LUMBAR THREE-FOUR   . PTCA    . TONSILLECTOMY      Family History  Problem Relation Age of Onset  . Heart attack Father   . Heart  attack Mother   . Colon cancer Neg Hx   . Bladder Cancer Brother   . Cervical cancer Daughter     ?   Social History:  reports that she quit smoking about 21 years ago. She quit after 15.00 years of use. She does not have any smokeless tobacco history on file. She reports that she does not drink alcohol or use drugs.  Allergies:  Allergies  Allergen Reactions  . Biaxin [Clarithromycin] Rash and Other (See Comments)    Blisters in mouth   . Penicillins Rash and Other (See Comments)    Blisters in mouth Has patient had a PCN reaction causing immediate rash, facial/tongue/throat swelling, SOB or lightheadedness with hypotension: Yesyes Has patient had a PCN reaction causing severe rash involving mucus membranes or skin necrosis: Nono Has patient had a PCN reaction that required hospitalization Nono Has patient had a PCN reaction occurring within the last 10 years: Nono If all of the above answers are "NO", then may proceed    Medications Prior to Admission  Medication Sig Dispense Refill  . acetaminophen (TYLENOL) 325 MG tablet Take 325 mg by mouth every 6 (six) hours as needed for mild pain.     Marland Kitchen albuterol (PROVENTIL HFA;VENTOLIN HFA) 108 (90 Base) MCG/ACT inhaler Take 2 puffs 3 times a day and every 4 hours as needed. (Patient taking differently: Inhale into the lungs. Take 2 puffs 3 times a day and every 4 hours as needed.)    . allopurinol (ZYLOPRIM) 300 MG tablet Take 300 mg by mouth daily.    Marland Kitchen amitriptyline (ELAVIL) 10 MG tablet Take 20 mg by mouth at bedtime.     Marland Kitchen aspirin EC 81 MG EC tablet Take 1 tablet (81 mg total) by mouth daily. (Patient taking differently: Take 81 mg by mouth every morning. )    . atorvastatin (LIPITOR) 80 MG tablet Take 80 mg by mouth daily.    . carvedilol (COREG) 25 MG tablet Take 1 tablet (25 mg total) by mouth 2 (two) times daily. 180 tablet 3  . docusate sodium (COLACE) 100 MG capsule Take 1 capsule (100 mg total) by mouth every 12 (twelve) hours.  60 capsule 1  . fexofenadine (ALLEGRA) 180 MG tablet Take 180 mg by mouth daily.      . fluticasone (FLONASE) 50 MCG/ACT nasal spray Place 2 sprays into both nostrils daily.    . furosemide (LASIX) 20 MG tablet Take 20 mg by mouth 2 (two) times daily.    Marland Kitchen HYDROcodone-acetaminophen (NORCO) 7.5-325 MG tablet Take 1 tablet by mouth every 6 (six) hours as needed for moderate pain.    . hydroxychloroquine (PLAQUENIL) 200 MG tablet Take 400 mg by mouth daily.    . iron polysaccharides (NIFEREX) 150 MG capsule Take 150 mg by mouth daily.    Marland Kitchen leflunomide (ARAVA) 20 MG tablet Take 20 mg by mouth  daily.    . magnesium oxide (MAG-OX) 400 MG tablet Take 1 tablet (400 mg total) by mouth daily. 30 tablet 11  . methimazole (TAPAZOLE) 10 MG tablet Take 1 tablet (10 mg total) by mouth daily. For treatment of your thyroid disorder. 30 tablet 3  . metoCLOPramide (REGLAN) 5 MG tablet Take 10 mg by mouth at bedtime.     . montelukast (SINGULAIR) 10 MG tablet Take 10 mg by mouth at bedtime.    . Multiple Vitamins-Minerals (MACULAR VITAMIN BENEFIT PO) Take 2 tablets by mouth daily.    . nitroGLYCERIN (NITROSTAT) 0.4 MG SL tablet Place 0.4 mg under the tongue every 5 (five) minutes as needed for chest pain. Reported on 03/24/2016    . Omega-3 Fatty Acids (FISH OIL) 1000 MG CAPS Take 1,000 mg by mouth 3 (three) times daily with meals. Reported on 03/24/2016    . omeprazole (PRILOSEC) 20 MG capsule Take 20 mg by mouth 2 (two) times daily before a meal. Reported on 03/24/2016    . omeprazole (PRILOSEC) 20 MG capsule TAKE 1 CAPSULE(20 MG) BY MOUTH TWICE DAILY BEFORE A MEAL 60 capsule 5  . Polyethyl Glycol-Propyl Glycol (SYSTANE OP) Apply 1 drop to eye 3 (three) times daily as needed (Dry Eyes).    . potassium chloride SA (K-DUR,KLOR-CON) 20 MEQ tablet Take 1 tablet (20 mEq total) by mouth 2 (two) times daily. (Patient taking differently: Take 20 mEq by mouth 2 (two) times daily as needed (when taking Lasix). ) 40 tablet 11  .  sucralfate (CARAFATE) 1 g tablet TAKE 1 TABLET BY MOUTH FOUR TIMES DAILY. CRUSH PILLS AND ADD TO SMALL AMOUNT OF WATER OR APPLESAUCE 120 tablet 1  . polyethylene glycol (MIRALAX) packet Take 17 g by mouth daily. (Patient taking differently: Take 17 g by mouth daily as needed for mild constipation. ) 60 each 1  . triamcinolone cream (KENALOG) 0.1 % Apply 1 application topically daily as needed (for irritation).       Results for orders placed or performed during the hospital encounter of 06/16/16 (from the past 48 hour(s))  Glucose, capillary     Status: Abnormal   Collection Time: 06/16/16  7:14 AM  Result Value Ref Range   Glucose-Capillary 138 (H) 65 - 99 mg/dL   No results found.  Review of Systems  Constitutional: Negative.   HENT: Negative.   Eyes: Negative.   Respiratory: Negative.   Cardiovascular: Negative.   Gastrointestinal: Negative.   Genitourinary: Negative.   Musculoskeletal: Negative.   Skin: Negative.   Neurological: Negative.   Endo/Heme/Allergies: Negative.   Psychiatric/Behavioral: Negative.     Blood pressure (!) 143/58, pulse (!) 101, temperature 98.8 F (37.1 C), resp. rate 20, height 5' 4.5" (1.638 m), weight 62.6 kg (138 lb), SpO2 98 %. Physical Exam  Constitutional: She is oriented to person, place, and time. She appears well-developed and well-nourished.  HENT:  Head: Normocephalic and atraumatic.  Eyes: EOM are normal. Pupils are equal, round, and reactive to light.  Neck: Normal range of motion.  Cardiovascular: Normal rate.   Respiratory: Effort normal.  GI: Soft.  Musculoskeletal: Normal range of motion.  Neurological: She is alert and oriented to person, place, and time.  Skin: Skin is warm and dry.  Psychiatric: She has a normal mood and affect. Her behavior is normal. Thought content normal.     Assessment/Plan ASSESSMENT: Chronic pain syndrome, lumbar postlaminectomy syndrome,back pain.  PLAN: SCS implant,[employer]  Bonna Gains, MD 06/16/2016, 9:29  AM

## 2016-06-16 ENCOUNTER — Ambulatory Visit (HOSPITAL_COMMUNITY): Payer: Medicare Other

## 2016-06-16 ENCOUNTER — Ambulatory Visit (HOSPITAL_COMMUNITY)
Admission: RE | Admit: 2016-06-16 | Discharge: 2016-06-16 | Disposition: A | Discharge status: Home / Self Care | Payer: Medicare Other | Source: Ambulatory Visit | Attending: Anesthesiology | Admitting: Anesthesiology

## 2016-06-16 ENCOUNTER — Ambulatory Visit (HOSPITAL_COMMUNITY): Payer: Medicare Other | Admitting: Vascular Surgery

## 2016-06-16 ENCOUNTER — Ambulatory Visit (HOSPITAL_COMMUNITY): Payer: Medicare Other | Admitting: Certified Registered Nurse Anesthetist

## 2016-06-16 ENCOUNTER — Encounter (HOSPITAL_COMMUNITY)
Admission: RE | Disposition: A | Discharge status: Home / Self Care | Payer: Self-pay | Source: Ambulatory Visit | Attending: Anesthesiology

## 2016-06-16 ENCOUNTER — Encounter (HOSPITAL_COMMUNITY): Payer: Self-pay | Admitting: General Practice

## 2016-06-16 DIAGNOSIS — M109 Gout, unspecified: Secondary | ICD-10-CM | POA: Diagnosis not present

## 2016-06-16 DIAGNOSIS — G894 Chronic pain syndrome: Secondary | ICD-10-CM | POA: Diagnosis not present

## 2016-06-16 DIAGNOSIS — E0590 Thyrotoxicosis, unspecified without thyrotoxic crisis or storm: Secondary | ICD-10-CM | POA: Diagnosis not present

## 2016-06-16 DIAGNOSIS — E1140 Type 2 diabetes mellitus with diabetic neuropathy, unspecified: Secondary | ICD-10-CM | POA: Diagnosis not present

## 2016-06-16 DIAGNOSIS — Z79899 Other long term (current) drug therapy: Secondary | ICD-10-CM | POA: Diagnosis not present

## 2016-06-16 DIAGNOSIS — I252 Old myocardial infarction: Secondary | ICD-10-CM | POA: Diagnosis not present

## 2016-06-16 DIAGNOSIS — E785 Hyperlipidemia, unspecified: Secondary | ICD-10-CM | POA: Diagnosis not present

## 2016-06-16 DIAGNOSIS — M961 Postlaminectomy syndrome, not elsewhere classified: Secondary | ICD-10-CM | POA: Insufficient documentation

## 2016-06-16 DIAGNOSIS — J449 Chronic obstructive pulmonary disease, unspecified: Secondary | ICD-10-CM | POA: Insufficient documentation

## 2016-06-16 DIAGNOSIS — Z981 Arthrodesis status: Secondary | ICD-10-CM | POA: Diagnosis not present

## 2016-06-16 DIAGNOSIS — Z7982 Long term (current) use of aspirin: Secondary | ICD-10-CM | POA: Diagnosis not present

## 2016-06-16 DIAGNOSIS — I130 Hypertensive heart and chronic kidney disease with heart failure and stage 1 through stage 4 chronic kidney disease, or unspecified chronic kidney disease: Secondary | ICD-10-CM | POA: Diagnosis not present

## 2016-06-16 DIAGNOSIS — M5416 Radiculopathy, lumbar region: Secondary | ICD-10-CM | POA: Diagnosis not present

## 2016-06-16 DIAGNOSIS — D638 Anemia in other chronic diseases classified elsewhere: Secondary | ICD-10-CM | POA: Diagnosis not present

## 2016-06-16 DIAGNOSIS — E1122 Type 2 diabetes mellitus with diabetic chronic kidney disease: Secondary | ICD-10-CM | POA: Diagnosis not present

## 2016-06-16 DIAGNOSIS — Z87891 Personal history of nicotine dependence: Secondary | ICD-10-CM | POA: Diagnosis not present

## 2016-06-16 DIAGNOSIS — I428 Other cardiomyopathies: Secondary | ICD-10-CM | POA: Insufficient documentation

## 2016-06-16 DIAGNOSIS — K219 Gastro-esophageal reflux disease without esophagitis: Secondary | ICD-10-CM | POA: Insufficient documentation

## 2016-06-16 DIAGNOSIS — M069 Rheumatoid arthritis, unspecified: Secondary | ICD-10-CM | POA: Diagnosis not present

## 2016-06-16 DIAGNOSIS — Z7951 Long term (current) use of inhaled steroids: Secondary | ICD-10-CM | POA: Insufficient documentation

## 2016-06-16 DIAGNOSIS — I2510 Atherosclerotic heart disease of native coronary artery without angina pectoris: Secondary | ICD-10-CM | POA: Diagnosis not present

## 2016-06-16 DIAGNOSIS — I5022 Chronic systolic (congestive) heart failure: Secondary | ICD-10-CM | POA: Insufficient documentation

## 2016-06-16 DIAGNOSIS — N189 Chronic kidney disease, unspecified: Secondary | ICD-10-CM | POA: Diagnosis not present

## 2016-06-16 DIAGNOSIS — Z951 Presence of aortocoronary bypass graft: Secondary | ICD-10-CM | POA: Insufficient documentation

## 2016-06-16 DIAGNOSIS — Z419 Encounter for procedure for purposes other than remedying health state, unspecified: Secondary | ICD-10-CM

## 2016-06-16 HISTORY — PX: SPINAL CORD STIMULATOR INSERTION: SHX5378

## 2016-06-16 LAB — GLUCOSE, CAPILLARY
Glucose-Capillary: 138 mg/dL — ABNORMAL HIGH (ref 65–99)
Glucose-Capillary: 140 mg/dL — ABNORMAL HIGH (ref 65–99)

## 2016-06-16 SURGERY — INSERTION, SPINAL CORD STIMULATOR, LUMBAR
Anesthesia: Monitor Anesthesia Care

## 2016-06-16 MED ORDER — MIDAZOLAM HCL 2 MG/2ML IJ SOLN
INTRAMUSCULAR | Status: AC
  Filled 2016-06-16: qty 2

## 2016-06-16 MED ORDER — FENTANYL CITRATE (PF) 250 MCG/5ML IJ SOLN
INTRAMUSCULAR | Status: AC
  Filled 2016-06-16: qty 5

## 2016-06-16 MED ORDER — ONDANSETRON HCL 4 MG/2ML IJ SOLN
INTRAMUSCULAR | Status: DC | PRN
  Administered 2016-06-16: 4 mg via INTRAVENOUS

## 2016-06-16 MED ORDER — SODIUM CHLORIDE 0.9 % IR SOLN
Status: DC | PRN
  Administered 2016-06-16: 500 mL

## 2016-06-16 MED ORDER — BUPIVACAINE-EPINEPHRINE (PF) 0.5% -1:200000 IJ SOLN
INTRAMUSCULAR | Status: DC | PRN
  Administered 2016-06-16: 22 mL via PERINEURAL

## 2016-06-16 MED ORDER — CHLORHEXIDINE GLUCONATE CLOTH 2 % EX PADS: 6.0000 | MEDICATED_PAD | Freq: Once | CUTANEOUS | Status: DC

## 2016-06-16 MED ORDER — DEXMEDETOMIDINE HCL IN NACL 200 MCG/50ML IV SOLN
INTRAVENOUS | Status: DC | PRN
  Administered 2016-06-16: 1.2 ug/kg/h via INTRAVENOUS

## 2016-06-16 MED ORDER — PHENYLEPHRINE HCL 10 MG/ML IJ SOLN
INTRAMUSCULAR | Status: DC | PRN
  Administered 2016-06-16: 80 ug via INTRAVENOUS

## 2016-06-16 MED ORDER — HYDROCODONE-ACETAMINOPHEN 7.5-325 MG PO TABS: 1.0000 | ORAL_TABLET | ORAL | 0 refills | Status: DC | PRN

## 2016-06-16 MED ORDER — PHENYLEPHRINE 40 MCG/ML (10ML) SYRINGE FOR IV PUSH (FOR BLOOD PRESSURE SUPPORT)
PREFILLED_SYRINGE | INTRAVENOUS | Status: AC
  Filled 2016-06-16: qty 20

## 2016-06-16 MED ORDER — ONDANSETRON HCL 4 MG/2ML IJ SOLN
INTRAMUSCULAR | Status: AC
  Filled 2016-06-16: qty 2

## 2016-06-16 MED ORDER — BACITRACIN-NEOMYCIN-POLYMYXIN OINTMENT TUBE
TOPICAL_OINTMENT | CUTANEOUS | Status: DC | PRN
  Administered 2016-06-16: 1 via TOPICAL

## 2016-06-16 MED ORDER — PHENYLEPHRINE 40 MCG/ML (10ML) SYRINGE FOR IV PUSH (FOR BLOOD PRESSURE SUPPORT)
PREFILLED_SYRINGE | INTRAVENOUS | Status: AC
  Filled 2016-06-16: qty 10

## 2016-06-16 MED ORDER — PROPOFOL 500 MG/50ML IV EMUL: INTRAVENOUS | Status: DC | PRN

## 2016-06-16 MED ORDER — LACTATED RINGERS IV SOLN
INTRAVENOUS | Status: DC
  Administered 2016-06-16: 08:00:00 via INTRAVENOUS

## 2016-06-16 MED ORDER — MIDAZOLAM HCL 5 MG/5ML IJ SOLN
INTRAMUSCULAR | Status: DC | PRN
  Administered 2016-06-16: 2 mg via INTRAVENOUS

## 2016-06-16 MED ORDER — FENTANYL CITRATE (PF) 100 MCG/2ML IJ SOLN
INTRAMUSCULAR | Status: DC | PRN
  Administered 2016-06-16 (×2): 50 ug via INTRAVENOUS

## 2016-06-16 MED ORDER — 0.9 % SODIUM CHLORIDE (POUR BTL) OPTIME
TOPICAL | Status: DC | PRN
  Administered 2016-06-16: 1000 mL

## 2016-06-16 MED ORDER — PROPOFOL 10 MG/ML IV BOLUS
INTRAVENOUS | Status: DC | PRN
  Administered 2016-06-16: 30 mg via INTRAVENOUS
  Administered 2016-06-16: 20 mg via INTRAVENOUS

## 2016-06-16 SURGICAL SUPPLY — 62 items
ANCHOR CLIK X NEURO (Stimulator) ×2 IMPLANT
BAG DECANTER FOR FLEXI CONT (MISCELLANEOUS) ×2 IMPLANT
BENZOIN TINCTURE PRP APPL 2/3 (GAUZE/BANDAGES/DRESSINGS) IMPLANT
BINDER ABDOMINAL 12 ML 46-62 (SOFTGOODS) ×2 IMPLANT
BLADE CLIPPER SURG (BLADE) IMPLANT
CABLE OR STIMULATOR 2X8 61 (WIRE) ×4 IMPLANT
CHLORAPREP W/TINT 26ML (MISCELLANEOUS) ×2 IMPLANT
CLIP TI WIDE RED SMALL 6 (CLIP) IMPLANT
DRAPE C-ARM 42X72 X-RAY (DRAPES) ×2 IMPLANT
DRAPE C-ARMOR (DRAPES) ×2 IMPLANT
DRAPE LAPAROTOMY 100X72X124 (DRAPES) ×2 IMPLANT
DRAPE POUCH INSTRU U-SHP 10X18 (DRAPES) ×2 IMPLANT
DRAPE SURG 17X23 STRL (DRAPES) ×2 IMPLANT
DRSG OPSITE POSTOP 3X4 (GAUZE/BANDAGES/DRESSINGS) ×4 IMPLANT
DRSG OPSITE POSTOP 4X6 (GAUZE/BANDAGES/DRESSINGS) IMPLANT
ELECT REM PT RETURN 9FT ADLT (ELECTROSURGICAL) ×2
ELECTRODE REM PT RTRN 9FT ADLT (ELECTROSURGICAL) ×1 IMPLANT
GAUZE SPONGE 4X4 16PLY XRAY LF (GAUZE/BANDAGES/DRESSINGS) ×2 IMPLANT
GLOVE BIOGEL PI IND STRL 7.5 (GLOVE) ×1 IMPLANT
GLOVE BIOGEL PI INDICATOR 7.5 (GLOVE) ×1
GLOVE ECLIPSE 7.5 STRL STRAW (GLOVE) ×2 IMPLANT
GLOVE EXAM NITRILE LRG STRL (GLOVE) IMPLANT
GLOVE EXAM NITRILE MD LF STRL (GLOVE) IMPLANT
GLOVE EXAM NITRILE XL STR (GLOVE) IMPLANT
GLOVE EXAM NITRILE XS STR PU (GLOVE) IMPLANT
GOWN STRL REUS W/ TWL LRG LVL3 (GOWN DISPOSABLE) IMPLANT
GOWN STRL REUS W/ TWL XL LVL3 (GOWN DISPOSABLE) IMPLANT
GOWN STRL REUS W/TWL 2XL LVL3 (GOWN DISPOSABLE) IMPLANT
GOWN STRL REUS W/TWL LRG LVL3 (GOWN DISPOSABLE)
GOWN STRL REUS W/TWL XL LVL3 (GOWN DISPOSABLE)
IPG PRECISION SPECTRA (Stimulator) ×2 IMPLANT
KIT BASIN OR (CUSTOM PROCEDURE TRAY) ×2 IMPLANT
KIT CHARGING (KITS) ×1
KIT CHARGING PRECISION NEURO (KITS) ×1 IMPLANT
KIT PAT PROGRAM FREELINK (KITS) ×1 IMPLANT
KIT ROOM TURNOVER OR (KITS) ×2 IMPLANT
LEAD INFINION CX PERC 70CM (Cable) ×4 IMPLANT
LIQUID BAND (GAUZE/BANDAGES/DRESSINGS) ×2 IMPLANT
NEEDLE 18GX1X1/2 (RX/OR ONLY) (NEEDLE) IMPLANT
NEEDLE ENTRADA 4.5IN (NEEDLE) ×4 IMPLANT
NEEDLE HYPO 25X1 1.5 SAFETY (NEEDLE) ×2 IMPLANT
NS IRRIG 1000ML POUR BTL (IV SOLUTION) ×2 IMPLANT
PACK LAMINECTOMY NEURO (CUSTOM PROCEDURE TRAY) ×2 IMPLANT
PAD ARMBOARD 7.5X6 YLW CONV (MISCELLANEOUS) ×4 IMPLANT
REMOTE CONTROL KIT (KITS) ×2
SPONGE LAP 4X18 X RAY DECT (DISPOSABLE) ×2 IMPLANT
SPONGE SURGIFOAM ABS GEL SZ50 (HEMOSTASIS) IMPLANT
STAPLER SKIN PROX WIDE 3.9 (STAPLE) ×2 IMPLANT
STRIP CLOSURE SKIN 1/2X4 (GAUZE/BANDAGES/DRESSINGS) IMPLANT
SUT MNCRL AB 4-0 PS2 18 (SUTURE) IMPLANT
SUT SILK 0 (SUTURE) ×1
SUT SILK 0 MO-6 18XCR BRD 8 (SUTURE) ×1 IMPLANT
SUT SILK 0 TIES 10X30 (SUTURE) IMPLANT
SUT SILK 2 0 TIES 10X30 (SUTURE) IMPLANT
SUT VIC AB 2-0 CP2 18 (SUTURE) ×10 IMPLANT
SYR EPIDURAL 5ML GLASS (SYRINGE) ×2 IMPLANT
SYRINGE 10CC LL (SYRINGE) ×2 IMPLANT
TOOL LONG TUNNEL (SPINAL CORD STIMULATOR) ×2 IMPLANT
TOWEL OR 17X24 6PK STRL BLUE (TOWEL DISPOSABLE) ×2 IMPLANT
TOWEL OR 17X26 10 PK STRL BLUE (TOWEL DISPOSABLE) ×2 IMPLANT
WATER STERILE IRR 1000ML POUR (IV SOLUTION) ×2 IMPLANT
YANKAUER SUCT BULB TIP NO VENT (SUCTIONS) ×2 IMPLANT

## 2016-06-16 NOTE — Op Note (Signed)
PREOP DX: 1) lumbago  2) lumbar radiculopathy  3) lumbar post-laminectomy syndrome  4) chronic pain  POSTOP DX: 1) lumbago  2) lumbar radiculopathy  3) lumbar post-laminectomy syndrome  4) chronic pain PROCEDURES PERFORMED:1) intraop fluoro 2) placement of 2 16 contact [employer] Infinion leads 3) placement of Spectra SCS generator  SURGEON:Jalexus Brett  ASSISTANT: NONE  ANESTHESIA: MAC  EBL: <20cc  DESCRIPTION OF PROCEDURE: After a discussion of risks, benefits and alternatives, informed consent was obtained. The patient was taken to the OR, turned prone onto a Jackson table, all pressure points padded, SCD's placed, and an adequate plane of anesthesia induced. A timeout was taken to verify the correct patient, position, personnel, availability of appropriate equipment, and administration of perioperative antibiotics.  The thoracic and lumbar areas were widely prepped with chloraprep and draped into a sterile field. Fluoroscopy was used to plan a right paramedian incision at the L1-L2/3 levels, and an incision made with a 10 blade and carried down to the dorsolumbar fascia with the bovie and blunt dissection. Retractors were placed and a 14g [employer] tuohy needle placed into the epidural space at the T12-L1 interspace using biplanar fluoro and loss-of-resistance technique. The needle was aspirated without any return of fluid. A [employer] INFINION lead was introduced and under live AP fluoro advanced until the distal-most contact overlay the  T6-7 disc space with the rest of the contacts distributed over the T7 and T8 vertebral bodies in a position just right of anatomic midline. A second Infinion lead was placed just left of anatomic midline in the same levels using the same technique. The patient was awakened and the leads tested; impedances were  good, and the patient reported good coverage with amplitudes in the 6-9 mA range. 0 silk sutures were placed in the fascia adjacent to the needles. The needles and stylets were removed under fluoroscopy with no lead migration noted. Leads were then fixed to the fascia by chest tube-type fixation into position with the sutures; repeat images were obtained to verify that there had been no lead migration.  The incision was inspected and hemostasis obtained with the bipolar cautery.  Attention was then turned to creation of a subcutaneous pocket. At the right flank/buttock, a 3 cm incision was made with a 10 blade and using the bovie and blunt dissection a pocket of size appropriate to place a SCS generator. The pocket was trialed, and found to be of adequate size. The pocket was inspected for hemostasis, which was found to be excellent. Using reverse seldinger technique, the leads were tunneled to the pocket site, and the leads inserted into the SCS generator. Impedances were checked, and all found to be excellent. The leads were then all fixed into position with a self-torquing wrench. The wiring was all carefully coiled, placed behind the generator and placed in the pocket.  Both incisions were copiously irrigated with bacitracin-containing irrigation. The lumbar incision was closed in 2 deep layers of interrupted 2-0 vicryl and the skin closed with staples. The pocket incision was closed with a deeper layer of 2-0 vicryl interrupted sutures, and the skin closed with staples. Sterile dressings were applied. Needle, sponge, and instrument counts were correct x2 at the end of the case.  The patient was then carefully awakened from anesthesia, turned supine, an abdominal binder placed, and the patient taken to the recovery room where he underwent complex spinal cord stimulator programming.  COMPLICATIONS: NONE  CONDITION: Stable throughout the course of the procedure and immediately  afterward  DISPOSITION:  discharge to home, with antibiotics and pain medicine. Discussed care with the patient and family member. Followup in clinic will be scheduled in 10-14 days.

## 2016-06-16 NOTE — Anesthesia Procedure Notes (Signed)
Procedure Name: MAC Date/Time: 06/16/2016 10:08 AM Performed by: Salli Quarry Mallorie Norrod Pre-anesthesia Checklist: Patient identified, Emergency Drugs available, Suction available and Patient being monitored Patient Re-evaluated:Patient Re-evaluated prior to inductionOxygen Delivery Method: Simple face mask

## 2016-06-16 NOTE — Transfer of Care (Signed)
Immediate Anesthesia Transfer of Care Note  Patient: Katherine Larson  Procedure(s) Performed: Procedure(s) with comments: [place] (N/A) - [place]  Patient Location: PACU  Anesthesia Type:MAC  Level of Consciousness: awake, alert , oriented and patient cooperative  Airway & Oxygen Therapy: Patient Spontanous Breathing  Post-op Assessment: Report given to RN and Post -op Vital signs reviewed and stable  Post vital signs: Reviewed and stable  Last Vitals:  Vitals:   06/16/16 0718  BP: (!) 143/58  Pulse: (!) 101  Resp: 20  Temp: 37.1 C    Last Pain:  Vitals:   06/16/16 0726  PainSc: 7       Patients Stated Pain Goal: 4 ([id number])  Complications: No apparent anesthesia complications

## 2016-06-16 NOTE — Anesthesia Preprocedure Evaluation (Signed)
Anesthesia Evaluation  Patient identified by MRN, date of birth, ID band Patient awake    Reviewed: Allergy & Precautions, NPO status , Patient's Chart, lab work & pertinent test results  History of Anesthesia Complications (+) PONV and history of anesthetic complications  Airway Mallampati: II  TM Distance: >3 FB Neck ROM: Full    Dental   Pulmonary shortness of breath, asthma , COPD, former smoker,    breath sounds clear to auscultation       Cardiovascular + CAD, + Past MI and + Peripheral Vascular Disease   Rhythm:Regular Rate:Normal     Neuro/Psych  Headaches,    GI/Hepatic hiatal hernia, GERD  ,  Endo/Other  diabetesHyperthyroidism   Renal/GU      Musculoskeletal  (+) Arthritis ,   Abdominal   Peds  Hematology  (+) anemia ,   Anesthesia Other Findings   Reproductive/Obstetrics                             Anesthesia Physical Anesthesia Plan  ASA: III  Anesthesia Plan: MAC   Post-op Pain Management:    Induction: Intravenous  Airway Management Planned: Simple Face Mask  Additional Equipment:   Intra-op Plan:   Post-operative Plan:   Informed Consent:   Plan Discussed with: CRNA  Anesthesia Plan Comments:         Anesthesia Quick Evaluation

## 2016-06-16 NOTE — Discharge Instructions (Signed)
Dr. Nickole Adamek Post-Op Orders ° °• Ice Pack - 20 minutes on (in a pillow case), and 20 minutes off. Wear the ice pack UNDER the binder. °• Follow up in office, they will call you for an appointment in 10 days to 2 weeks. °• Increase activity gradually.   °• No lifting anything heavier than a gallon of milk (10 pounds) until seen in the office. °• Advance diet slowly as tolerated. °• Dressing care:  Keep dressing dry for 3 days, and on Post-op day 4, may shower. °• Call for fever, drainage, and redness. °• No swimming or bathing in a bathtub (do not get into standing water). °•  °

## 2016-06-16 NOTE — Anesthesia Postprocedure Evaluation (Signed)
Anesthesia Post Note  Patient: Katherine Larson  Procedure(s) Performed: Procedure(s) (LRB): LUMBAR SPINAL CORD STIMULATOR INSERTION (N/A)  Patient location during evaluation: PACU Anesthesia Type: MAC Level of consciousness: awake and alert Pain management: pain level controlled Vital Signs Assessment: post-procedure vital signs reviewed and stable Respiratory status: spontaneous breathing, nonlabored ventilation, respiratory function stable and patient connected to nasal cannula oxygen Cardiovascular status: stable and blood pressure returned to baseline Anesthetic complications: no    Last Vitals:  Vitals:   06/16/16 1210 06/16/16 1225  BP:  (!) 104/52  Pulse:  79  Resp:  19  Temp: 36.2 C     Last Pain:  Vitals:   06/16/16 0726  PainSc: 7                  Robertta Halfhill,JAMES TERRILL

## 2016-06-19 ENCOUNTER — Encounter (HOSPITAL_COMMUNITY): Payer: Self-pay | Admitting: Anesthesiology

## 2016-07-11 NOTE — Progress Notes (Signed)
Cardiology Office Note    Date:  07/13/2016  ID:  AMILEY CUCCIA, DOB 10/26/1946, MRN [id number] PCP:  [name], MD  Cardiologist:  Dr. [name] Cancel Linna Hoff), EP - Dr. [name]   Chief Complaint: f/u elevated HR, syncope  History of Present Illness:  DORSA OBENAUER is a 70 y.o. female with history of CAD s/p CABG 1996, NSVT, paroxysmal atrial tach, inappropriate sinus tachycardia, HTN, DM, HLD, chronic systolic CHF, LBBB (during 04/2016 admission), hyperthyroidism (on Tapazole), chronic back pain, chronic abdominal pain (mesenteric vascular disease but no high grade stenosis per [facility] note), rheumatoid arthritis, anemia of chronic disease +/- IDA who presents for follow-up. Primarily seen by Dr [name]   Seen in 2016 because of history of atrial tachycardia. 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 He did not change medications and recommended a period of watchful waiting. More recently she was admitted 04/29/16 for chest pain and new drop in her EF. Her EKG was felt possibly atrial tach with LBBB, with SR/ST on telemetry per notes. 2D Echo 04/30/16: EF 25-30%, diffuse HK, elevated LVEDP, mild MR, mod LAE. Cath 05/01/16 revealed an occluded native LAD at a prior stent site. She had a widely patent sequential LIMA-D1-LAD. She had normal Rt heart pressures indicating adequate diuresis. It was felt that the drop in her EF was secondary to NICM. Her metoprolol was changed to carvedilol. Discharge weight 129lb. She was seen back for a TOC visit 05/10/16 at which time HR was 115-130s. Coreg was increased. F/u BMET after OV 6/21 showed K 3.5, Cr 0.68. Recent labs otherwise notable for TSH 1.79, Hgb 11 (chronically 10-11). She presented to the office for f/u EKG 05/12/16 at which time she complained of dizziness. EKG showed sinus tachycardia  BP 110/64, HR 104. Orthostatic BP went from 120/70 lying to 104/58 sitting with HR 102->109. She was instructed to stop  Lasix.  Last seen by Dr [name] 05/19/16 and still no definitive plan for BiV AICD  Had syncopal spell June 2017 event monitor showed no arrhythmia  Past Medical History:  Diagnosis Date  . Anemia    anemia of chronic disease +/- IDA followed by Dr. Earlie Server. received iron infusions in the past  . Asthma   . Borderline glaucoma   . CAD (coronary artery disease)    a. s/p CABG 1996. b. low risk nuc 2011. c. LHC 04/2016 due to drop in EF -> occluded native LAD,  widely patent sequential LIMA-D1-LAD.  Marland Kitchen Chronic kidney disease    "mild stage of kidney disease"  . Chronic systolic CHF (congestive heart failure) ([place])   . Complication of anesthesia   . DM2 (diabetes mellitus, type 2) ([place])    not on any medicine for this at this time, 05/2016  . GERD (gastroesophageal reflux disease)   . Gout   . History of hiatal hernia    in 88s  . History of pneumonia    March, 2016  . HLD (hyperlipidemia)   . HTN (hypertension)   . Hyperthyroidism 01/21/2016  . Inappropriate sinus tachycardia ([place])   . LBBB (left bundle branch block)    a. Seen in 04/2016  . Myocardial infarction ([place])    1996  . Neuropathy ([place])   . NICM (nonischemic cardiomyopathy) ([place])    a. Dx 04/2016 - EF 25-30%, diffuse HK, elevated LVEDP, mild MR, mod LAE.  Marland Kitchen NSVT (nonsustained ventricular tachycardia) ([place])   . PAT (paroxysmal atrial tachycardia) (  [place])   . PONV (postoperative nausea and vomiting)    "after just about every surgery I've had"  . Rheumatoid arthritis ([facility])    RA  . Seasonal allergies     Past Surgical History:  Procedure Laterality Date  . ABDOMINAL HYSTERECTOMY    . APPENDECTOMY    . BACK SURGERY    . CARDIAC CATHETERIZATION N/A 05/01/2016   Procedure: Right/Left Heart Cath and Coronary/Graft Angiography;  Surgeon: Leonie Man, MD;  Location: [facility];  Service: Cardiovascular;  Laterality: N/A;  . CHOLECYSTECTOMY    . COLONOSCOPY  11/2011   Dr. Magod:ext/int hemorrhoids, diverticulosis  sigmoid colon and distal desc colon, mid-desc colon hyperplastic polyps.   . CORONARY ARTERY BYPASS GRAFT  1996   2 vessels  . ESOPHAGOGASTRODUODENOSCOPY  11/2011   Dr. Watt Climes: small hiatal hernia, one non-bleeding superficial gastric ulcer, medium-sized diverticulum in area of papilla  . ESOPHAGOGASTRODUODENOSCOPY N/A 12/29/2015   Dr. Gala Romney: Chronic inactive gastritis, normal esophagus status post dilation, duodenal diverticula  . EYE SURGERY Bilateral   . FRACTURE SURGERY Right    arm  . GIVENS CAPSULE STUDY  11/2012   Dr. Watt Climes: minimal gastritis, normal small bowel capsule  . HERNIA REPAIR     hiatal hernia  . MAXIMUM ACCESS (MAS)POSTERIOR LUMBAR INTERBODY FUSION (PLIF) 1 LEVEL N/A 08/14/2013   Procedure:  MAXIMUM ACCESS SURGERY(MAS) POSTERIOR LUMBAR INTERBODY FUSION LUMBAR THREE-FOUR ;  Surgeon: Eustace Moore, MD;  Location: [facility];  Service: Neurosurgery;  Laterality: N/A;   MAXIMUM ACCESS SURGERY(MAS) POSTERIOR LUMBAR INTERBODY FUSION LUMBAR THREE-FOUR   . PTCA    . SPINAL CORD STIMULATOR INSERTION N/A 06/16/2016   Procedure: LUMBAR SPINAL CORD STIMULATOR INSERTION;  Surgeon: Clydell Hakim, MD;  Location: [facility];  Service: Neurosurgery;  Laterality: N/A;  LUMBAR SPINAL CORD STIMULATOR INSERTION  . tens  05/2016  . TONSILLECTOMY      Current Medications: Outpatient Medications Prior to Visit  Medication Sig Dispense Refill  . acetaminophen (TYLENOL) 325 MG tablet Take 325 mg by mouth every 6 (six) hours as needed for mild pain.     Marland Kitchen albuterol (PROVENTIL HFA;VENTOLIN HFA) 108 (90 Base) MCG/ACT inhaler Take 2 puffs 3 times a day and every 4 hours as needed. (Patient taking differently: Inhale into the lungs. Take 2 puffs 3 times a day and every 4 hours as needed.)    . allopurinol (ZYLOPRIM) 300 MG tablet Take 300 mg by mouth daily.    Marland Kitchen amitriptyline (ELAVIL) 10 MG tablet Take 20 mg by mouth at bedtime.     Marland Kitchen aspirin EC 81 MG EC tablet Take 1 tablet (81 mg total) by mouth  daily. (Patient taking differently: Take 81 mg by mouth every morning. )    . atorvastatin (LIPITOR) 80 MG tablet Take 80 mg by mouth daily.    . carvedilol (COREG) 25 MG tablet Take 1 tablet (25 mg total) by mouth 2 (two) times daily. 180 tablet 3  . docusate sodium (COLACE) 100 MG capsule Take 1 capsule (100 mg total) by mouth every 12 (twelve) hours. 60 capsule 1  . fexofenadine (ALLEGRA) 180 MG tablet Take 180 mg by mouth daily.      . fluticasone (FLONASE) 50 MCG/ACT nasal spray Place 2 sprays into both nostrils daily.    . furosemide (LASIX) 20 MG tablet Take 20 mg by mouth 2 (two) times daily.    Marland Kitchen HYDROcodone-acetaminophen (NORCO) 7.5-325 MG tablet Take 1 tablet by mouth every 4 (  four) hours as needed for moderate pain. 50 tablet 0  . hydroxychloroquine (PLAQUENIL) 200 MG tablet Take 400 mg by mouth daily.    . iron polysaccharides (NIFEREX) 150 MG capsule Take 150 mg by mouth daily.    Marland Kitchen leflunomide (ARAVA) 20 MG tablet Take 20 mg by mouth daily.    . magnesium oxide (MAG-OX) 400 MG tablet Take 1 tablet (400 mg total) by mouth daily. 30 tablet 11  . methimazole (TAPAZOLE) 10 MG tablet Take 1 tablet (10 mg total) by mouth daily. For treatment of your thyroid disorder. 30 tablet 3  . metoCLOPramide (REGLAN) 5 MG tablet Take 10 mg by mouth at bedtime.     . montelukast (SINGULAIR) 10 MG tablet Take 10 mg by mouth at bedtime.    . Multiple Vitamins-Minerals (MACULAR VITAMIN BENEFIT PO) Take 2 tablets by mouth daily.    . nitroGLYCERIN (NITROSTAT) 0.4 MG SL tablet Place 0.4 mg under the tongue every 5 (five) minutes as needed for chest pain. Reported on 03/24/2016    . Omega-3 Fatty Acids (FISH OIL) 1000 MG CAPS Take 1,000 mg by mouth 3 (three) times daily with meals. Reported on 03/24/2016    . omeprazole (PRILOSEC) 20 MG capsule Take 20 mg by mouth 2 (two) times daily before a meal. Reported on 03/24/2016    . omeprazole (PRILOSEC) 20 MG capsule TAKE 1 CAPSULE(20 MG) BY MOUTH TWICE DAILY BEFORE A  MEAL 60 capsule 5  . Polyethyl Glycol-Propyl Glycol (SYSTANE OP) Apply 1 drop to eye 3 (three) times daily as needed ([place]).    . polyethylene glycol (MIRALAX) packet Take 17 g by mouth daily. (Patient taking differently: Take 17 g by mouth daily as needed for mild constipation. ) 60 each 1  . potassium chloride SA (K-DUR,KLOR-CON) 20 MEQ tablet Take 1 tablet (20 mEq total) by mouth 2 (two) times daily. (Patient taking differently: Take 20 mEq by mouth 2 (two) times daily as needed (when taking Lasix). ) 40 tablet 11  . sucralfate (CARAFATE) 1 g tablet TAKE 1 TABLET BY MOUTH FOUR TIMES DAILY. CRUSH PILLS AND ADD TO SMALL AMOUNT OF WATER OR APPLESAUCE 120 tablet 1  . triamcinolone cream (KENALOG) 0.1 % Apply 1 application topically daily as needed (for irritation).      No facility-administered medications prior to visit.      Allergies:   Biaxin [clarithromycin] and Penicillins   Social History   Social History  . Marital status: Married    Spouse name: N/A  . Number of children: 3  . Years of education: N/A   Social History Main Topics  . Smoking status: Former Smoker    Years: 15.00    Quit date: 11/20/1994  . Smokeless tobacco: Never Used     Comment: Quit in 1996  . Alcohol use No  . Drug use: No  . Sexual activity: Not Asked   Other Topics Concern  . None   Social History Narrative   2 living children, one deceased age 109        Family History:  The patient's family history includes Bladder Cancer in her brother; Cervical cancer in her daughter; Heart attack in her father and mother.   ROS:   Please see the history of present illness.  All other systems are reviewed and otherwise negative.    PHYSICAL EXAM:   VS:  BP 136/80   Pulse 90   Ht 5\' 4"  (1.626 m)   Wt 137 lb (62.1 kg)  SpO2 97%   BMI 23.52 kg/m   BMI: Body mass index is 23.52 kg/m. GEN: Well nourished, well developed WF, in no acute distress  HEENT: normocephalic, atraumatic Neck: no JVD,  carotid bruits, or masses Cardiac: RRR borderline tachycardic; no murmurs, rubs, or gallops, no edema  Respiratory:  clear to auscultation bilaterally, normal work of breathing GI: soft, nontender, nondistended, + BS MS: no deformity or atrophy  Skin: warm and dry, no rash Neuro:  Alert and Oriented x 3, Strength and sensation are intact, follows commands Psych: euthymic mood, full affect  Wt Readings from Last 3 Encounters:  07/13/16 137 lb (62.1 kg)  06/16/16 138 lb (62.6 kg)  06/09/16 138 lb 9.6 oz (62.9 kg)      Studies/Labs Reviewed:   EKG:  EKG was ordered today and personally reviewed by me and demonstratesNSR 100bpm, QTc 455ms, QRs 50ms, nonspecific ST-T changes  Recent Labs: 04/29/2016: ALT 13; B Natriuretic Peptide 1,552.0; TSH 1.793 05/17/2016: Magnesium 1.7 06/09/2016: BUN 8; Creatinine, Ser 0.56; Hemoglobin 11.1; Platelets 184; Potassium 3.7; Sodium 140   Lipid Panel    Component Value Date/Time   CHOL 112 12/19/2007 0910   TRIG 177 (H) 12/19/2007 0910   HDL 30.2 (L) 12/19/2007 0910   CHOLHDL 3.7 CALC 12/19/2007 0910   VLDL 35 12/19/2007 0910   LDLCALC 46 12/19/2007 0910    Additional studies/ records that were reviewed today include: Summarized above.    ASSESSMENT & PLAN:   1. Chronic systolic CHF/NICM - continue current meds EF 25-30% last echo 04/30/16 will repeat September and have her f/u with [facility] ? biV AICD  2. Paroxysmal atrial tach - f/u EP event monitor benign  3. CAD - continue ASA, BB, statin for now. Recent cath as above.patent LIMA 4. NSVT - continue beta blocker  5. HTN:  Well controlled.  Continue current medications and low sodium Dash type diet.   6. DM:  Discussed low carb diet.  Target hemoglobin A1c is 6.5 or less.  Continue current medications. 7. Chol:  F/u labs with primary continue statin     Jenkins Rouge

## 2016-07-13 ENCOUNTER — Encounter: Payer: Self-pay | Admitting: Cardiovascular Disease

## 2016-07-13 ENCOUNTER — Ambulatory Visit (INDEPENDENT_AMBULATORY_CARE_PROVIDER_SITE_OTHER): Payer: Medicare Other | Admitting: Cardiovascular Disease

## 2016-07-13 VITALS — BP 136/80 | HR 90 | Ht 64.0 in | Wt 137.0 lb

## 2016-07-13 DIAGNOSIS — I255 Ischemic cardiomyopathy: Secondary | ICD-10-CM

## 2016-07-13 NOTE — Patient Instructions (Addendum)
Your physician recommends that you schedule a follow-up appointment in:  6-8 weeks Dr [name], as needed with Dr [name] Cancel   Your physician recommends that you continue on your current medications as directed. Please refer to the Current Medication list given to you today.   Your physician has requested that you have an echocardiogram in early September. Echocardiography is a painless test that uses sound waves to create images of your heart. It provides your doctor with information about the size and shape of your heart and how well your heart's chambers and valves are working. This procedure takes approximately one hour. There are no restrictions for this procedure.     Thank you for choosing [facility] !

## 2016-07-21 ENCOUNTER — Ambulatory Visit (HOSPITAL_COMMUNITY)
Admission: RE | Admit: 2016-07-21 | Discharge: 2016-07-21 | Disposition: A | Discharge status: Home / Self Care | Payer: Medicare Other | Source: Ambulatory Visit | Attending: Cardiovascular Disease | Admitting: Cardiovascular Disease

## 2016-07-21 DIAGNOSIS — I059 Rheumatic mitral valve disease, unspecified: Secondary | ICD-10-CM | POA: Diagnosis not present

## 2016-07-21 DIAGNOSIS — Z951 Presence of aortocoronary bypass graft: Secondary | ICD-10-CM | POA: Insufficient documentation

## 2016-07-21 DIAGNOSIS — I255 Ischemic cardiomyopathy: Secondary | ICD-10-CM | POA: Diagnosis not present

## 2016-07-21 DIAGNOSIS — E119 Type 2 diabetes mellitus without complications: Secondary | ICD-10-CM | POA: Insufficient documentation

## 2016-07-21 DIAGNOSIS — I4892 Unspecified atrial flutter: Secondary | ICD-10-CM | POA: Insufficient documentation

## 2016-07-21 DIAGNOSIS — E785 Hyperlipidemia, unspecified: Secondary | ICD-10-CM | POA: Insufficient documentation

## 2016-07-21 LAB — ECHOCARDIOGRAM COMPLETE
AO mean calculated velocity dopler: 87.3 cm/s
AV Area VTI index: 0.95 cm2/m2
AV Area VTI: 1.82 cm2
AV Area mean vel: 1.75 cm2
AV Mean grad: 4 mmHg
AV Peak grad: 8 mmHg
AV VEL mean LVOT/AV: 0.69
AV area mean vel ind: 1.04 cm2/m2
AV peak Index: 1.08
AV pk vel: 138 cm/s
AV vel: 1.6
Ao pk vel: 0.72 m/s
E decel time: 106 msec
E/e' ratio: 22.41
FS: 10 % — AB (ref 28–44)
IVS/LV PW RATIO, ED: 0.95
LA ID, A-P, ES: 38 mm
LA diam end sys: 38 mm
LA diam index: 2.26 cm/m2
LA vol A4C: 35.8 ml
LA vol index: 24.7 mL/m2
LA vol: 41.5 mL
LV E/e' medial: 22.41
LV E/e'average: 22.41
LV PW d: 10.4 mm — AB (ref 0.6–1.1)
LV dias vol index: 50 mL/m2
LV dias vol: 84 mL (ref 46–106)
LV e' LATERAL: 5.22 cm/s
LV sys vol index: 34 mL/m2
LV sys vol: 57 mL — AB (ref 14–42)
LVOT SV: 46 mL
LVOT VTI: 18.1 cm
LVOT area: 2.54 cm2
LVOT diameter: 18 mm
LVOT peak VTI: 0.63 cm
LVOT peak grad rest: 4 mmHg
LVOT peak vel: 99 cm/s
Lateral S' vel: 8.05 cm/s
MV Dec: 106
MV Peak grad: 5 mmHg
MV pk E vel: 117 m/s
P 1/2 time: 564 ms
RV sys press: 22 mmHg
Reg peak vel: 216 cm/s
Simpson's disk: 33
Stroke v: 28 ml
TAPSE: 13.4 mm
TDI e' lateral: 5.22
TDI e' medial: 3.81
TR max vel: 216 cm/s
VTI: 28.7 cm
Valve area index: 0.95
Valve area: 1.6 cm2

## 2016-07-21 NOTE — Progress Notes (Signed)
*  PRELIMINARY RESULTS* Echocardiogram 2D Echocardiogram has been performed.  Samuel Germany 07/21/2016, 11:22 AM

## 2016-07-26 ENCOUNTER — Other Ambulatory Visit: Payer: Self-pay | Admitting: "Endocrinology

## 2016-07-26 LAB — TSH: TSH: 8.55 mIU/L — ABNORMAL HIGH

## 2016-07-26 LAB — T4, FREE: Free T4: 0.7 ng/dL — ABNORMAL LOW (ref 0.8–1.8)

## 2016-08-03 ENCOUNTER — Ambulatory Visit (INDEPENDENT_AMBULATORY_CARE_PROVIDER_SITE_OTHER): Payer: Medicare Other | Admitting: "Endocrinology

## 2016-08-03 ENCOUNTER — Encounter: Payer: Self-pay | Admitting: "Endocrinology

## 2016-08-03 VITALS — BP 144/84 | HR 92 | Wt 136.8 lb

## 2016-08-03 DIAGNOSIS — E0590 Thyrotoxicosis, unspecified without thyrotoxic crisis or storm: Secondary | ICD-10-CM | POA: Diagnosis not present

## 2016-08-03 DIAGNOSIS — E785 Hyperlipidemia, unspecified: Secondary | ICD-10-CM

## 2016-08-03 DIAGNOSIS — I10 Essential (primary) hypertension: Secondary | ICD-10-CM

## 2016-08-03 DIAGNOSIS — E119 Type 2 diabetes mellitus without complications: Secondary | ICD-10-CM | POA: Diagnosis not present

## 2016-08-03 NOTE — Progress Notes (Signed)
Subjective:    Patient ID: Katherine Larson, female    DOB: 10-23-1946, PCP [name], MD.   Past Medical History:  Diagnosis Date  . Anemia    anemia of chronic disease +/- IDA followed by Dr. Earlie Server. received iron infusions in the past  . Asthma   . Borderline glaucoma   . CAD (coronary artery disease)    a. s/p CABG 1996. b. low risk nuc 2011. c. LHC 04/2016 due to drop in EF -> occluded native LAD,  widely patent sequential LIMA-D1-LAD.  Marland Kitchen Chronic kidney disease    "mild stage of kidney disease"  . Chronic systolic CHF (congestive heart failure) ([place])   . Complication of anesthesia   . DM2 (diabetes mellitus, type 2) ([place])    not on any medicine for this at this time, 05/2016  . GERD (gastroesophageal reflux disease)   . Gout   . History of hiatal hernia    in 92s  . History of pneumonia    March, 2016  . HLD (hyperlipidemia)   . HTN (hypertension)   . Hyperthyroidism 01/21/2016  . Inappropriate sinus tachycardia ([place])   . LBBB (left bundle branch block)    a. Seen in 04/2016  . Myocardial infarction ([place])    1996  . Neuropathy ([place])   . NICM (nonischemic cardiomyopathy) ([place])    a. Dx 04/2016 - EF 25-30%, diffuse HK, elevated LVEDP, mild MR, mod LAE.  Marland Kitchen NSVT (nonsustained ventricular tachycardia) ([place])   . PAT (paroxysmal atrial tachycardia) ([place])   . PONV (postoperative nausea and vomiting)    "after just about every surgery I've had"  . Rheumatoid arthritis (HCC)    RA  . Seasonal allergies    Past Surgical History:  Procedure Laterality Date  . ABDOMINAL HYSTERECTOMY    . APPENDECTOMY    . BACK SURGERY    . CARDIAC CATHETERIZATION N/A 05/01/2016   Procedure: Right/Left Heart Cath and Coronary/Graft Angiography;  Surgeon: Leonie Man, MD;  Location: [facility];  Service: Cardiovascular;  Laterality: N/A;  . CHOLECYSTECTOMY    . COLONOSCOPY  11/2011   Dr. Magod:ext/int hemorrhoids, diverticulosis sigmoid colon and distal desc colon, mid-desc  colon hyperplastic polyps.   . CORONARY ARTERY BYPASS GRAFT  1996   2 vessels  . ESOPHAGOGASTRODUODENOSCOPY  11/2011   Dr. Watt Climes: small hiatal hernia, one non-bleeding superficial gastric ulcer, medium-sized diverticulum in area of papilla  . ESOPHAGOGASTRODUODENOSCOPY N/A 12/29/2015   Dr. Gala Romney: Chronic inactive gastritis, normal esophagus status post dilation, duodenal diverticula  . EYE SURGERY Bilateral   . FRACTURE SURGERY Right    arm  . GIVENS CAPSULE STUDY  11/2012   Dr. Watt Climes: minimal gastritis, normal small bowel capsule  . HERNIA REPAIR     hiatal hernia  . MAXIMUM ACCESS (MAS)POSTERIOR LUMBAR INTERBODY FUSION (PLIF) 1 LEVEL N/A 08/14/2013   Procedure:  MAXIMUM ACCESS SURGERY(MAS) POSTERIOR LUMBAR INTERBODY FUSION LUMBAR THREE-FOUR ;  Surgeon: Eustace Moore, MD;  Location: [facility];  Service: Neurosurgery;  Laterality: N/A;   MAXIMUM ACCESS SURGERY(MAS) POSTERIOR LUMBAR INTERBODY FUSION LUMBAR THREE-FOUR   . PTCA    . SPINAL CORD STIMULATOR INSERTION N/A 06/16/2016   Procedure: LUMBAR SPINAL CORD STIMULATOR INSERTION;  Surgeon: Clydell Hakim, MD;  Location: [facility];  Service: Neurosurgery;  Laterality: N/A;  LUMBAR SPINAL CORD STIMULATOR INSERTION  . tens  05/2016  . TONSILLECTOMY     Social History   Social History  . Marital status:  Married    Spouse name: N/A  . Number of children: 3  . Years of education: N/A   Social History Main Topics  . Smoking status: Former Smoker    Years: 15.00    Quit date: 11/20/1994  . Smokeless tobacco: Never Used     Comment: Quit in 1996  . Alcohol use No  . Drug use: No  . Sexual activity: Not Asked   Other Topics Concern  . None   Social History Narrative   2 living children, one deceased age 3      Outpatient Encounter Prescriptions as of 08/03/2016  Medication Sig  . acetaminophen (TYLENOL) 325 MG tablet Take 325 mg by mouth every 6 (six) hours as needed for mild pain.   Marland Kitchen albuterol (PROVENTIL HFA;VENTOLIN HFA) 108 (90  Base) MCG/ACT inhaler Take 2 puffs 3 times a day and every 4 hours as needed. (Patient taking differently: Inhale into the lungs. Take 2 puffs 3 times a day and every 4 hours as needed.)  . allopurinol (ZYLOPRIM) 300 MG tablet Take 300 mg by mouth daily.  Marland Kitchen amitriptyline (ELAVIL) 10 MG tablet Take 20 mg by mouth at bedtime.   Marland Kitchen aspirin EC 81 MG EC tablet Take 1 tablet (81 mg total) by mouth daily. (Patient taking differently: Take 81 mg by mouth every morning. )  . atorvastatin (LIPITOR) 80 MG tablet Take 80 mg by mouth daily.  . carvedilol (COREG) 25 MG tablet Take 1 tablet (25 mg total) by mouth 2 (two) times daily.  Marland Kitchen docusate sodium (COLACE) 100 MG capsule Take 1 capsule (100 mg total) by mouth every 12 (twelve) hours.  . fexofenadine (ALLEGRA) 180 MG tablet Take 180 mg by mouth daily.    . fluticasone (FLONASE) 50 MCG/ACT nasal spray Place 2 sprays into both nostrils daily.  . furosemide (LASIX) 20 MG tablet Take 20 mg by mouth 2 (two) times daily.  Marland Kitchen HYDROcodone-acetaminophen (NORCO) 7.5-325 MG tablet Take 1 tablet by mouth every 4 (four) hours as needed for moderate pain.  . hydroxychloroquine (PLAQUENIL) 200 MG tablet Take 400 mg by mouth daily.  . iron polysaccharides (NIFEREX) 150 MG capsule Take 150 mg by mouth daily.  Marland Kitchen leflunomide (ARAVA) 20 MG tablet Take 20 mg by mouth daily.  . magnesium oxide (MAG-OX) 400 MG tablet Take 1 tablet (400 mg total) by mouth daily.  . metoCLOPramide (REGLAN) 5 MG tablet Take 10 mg by mouth at bedtime.   . montelukast (SINGULAIR) 10 MG tablet Take 10 mg by mouth at bedtime.  . Multiple Vitamins-Minerals (MACULAR VITAMIN BENEFIT PO) Take 2 tablets by mouth daily.  . nitroGLYCERIN (NITROSTAT) 0.4 MG SL tablet Place 0.4 mg under the tongue every 5 (five) minutes as needed for chest pain. Reported on 03/24/2016  . Omega-3 Fatty Acids (FISH OIL) 1000 MG CAPS Take 1,000 mg by mouth 3 (three) times daily with meals. Reported on 03/24/2016  . omeprazole (PRILOSEC)  20 MG capsule Take 20 mg by mouth 2 (two) times daily before a meal. Reported on 03/24/2016  . omeprazole (PRILOSEC) 20 MG capsule TAKE 1 CAPSULE(20 MG) BY MOUTH TWICE DAILY BEFORE A MEAL  . Polyethyl Glycol-Propyl Glycol (SYSTANE OP) Apply 1 drop to eye 3 (three) times daily as needed (Dry Eyes).  . polyethylene glycol (MIRALAX) packet Take 17 g by mouth daily. (Patient taking differently: Take 17 g by mouth daily as needed for mild constipation. )  . potassium chloride SA (K-DUR,KLOR-CON) 20 MEQ tablet Take 1 tablet (  20 mEq total) by mouth 2 (two) times daily. (Patient taking differently: Take 20 mEq by mouth 2 (two) times daily as needed (when taking Lasix). )  . sucralfate (CARAFATE) 1 g tablet TAKE 1 TABLET BY MOUTH FOUR TIMES DAILY. CRUSH PILLS AND ADD TO SMALL AMOUNT OF WATER OR APPLESAUCE  . triamcinolone cream (KENALOG) 0.1 % Apply 1 application topically daily as needed (for irritation).   . [DISCONTINUED] methimazole (TAPAZOLE) 10 MG tablet Take 1 tablet (10 mg total) by mouth daily. For treatment of your thyroid disorder.   No facility-administered encounter medications on file as of 08/03/2016.    ALLERGIES:  VACCINATION STATUS:  There is no immunization history on file for this patient.  HPI  Katherine Larson is a 70 y.o. female with hx of ventricular tachycardia, type 2 diabetes, coronary artery disease s/p CABG, hypertension, hyperlipidemia, asthma. She Was recently hospitalized for  community-acquired pneumonia at [facility].  - Due to history of unexplained weight loss and palpitations she underwent thyroid function test which showed evidence of hyperthyroidism. -After brief treatment with Tapazole, she underwent thyroid uptake and scan which showed low uptake of 15% with no evidence of focal abnormality.  -  she has lost approximately 25 pounds since December associated with palpitations, tremors, heat intolerance and hot flashes.  She does have family history of  thyroid problem in her mother. The patient denies personal history of goiter.   Review of Systems Constitutional: - steady weight since last visit, + fatigue, - subjective hyperthermia Eyes: no blurry vision, no xerophthalmia ENT: no sore throat, no nodules palpated in throat, no dysphagia/odynophagia, no hoarseness Cardiovascular: no CP, +SOB, +alpitations Respiratory: +cough, +SOB Gastrointestinal: no N/V/D/C Musculoskeletal: no muscle/joint aches Skin: no rashes Neurological: + tremors Psychiatric: no depression/anxiety Objective:    BP (!) 144/84   Pulse 92   Wt 136 lb 12.8 oz (62.1 kg)   BMI 23.48 kg/m   Wt Readings from Last 3 Encounters:  08/03/16 136 lb 12.8 oz (62.1 kg)  07/13/16 137 lb (62.1 kg)  06/16/16 138 lb (62.6 kg)    Physical Exam   Constitutional: overweight, in NAD Eyes: PERRLA, EOMI, no exophthalmos ENT: moist mucous membranes, no thyromegaly, no cervical lymphadenopathy Cardiovascular: RRR, No MRG Respiratory: CTA B Gastrointestinal: abdomen soft, NT, ND, BS+ Musculoskeletal: no deformities, strength intact in all 4 Skin: moist, warm, no rashes Neurological:  Mild tremor with outstretched hands Which is improving from last encounter, DTR are brisk in all Extremities.  Complete Blood Count (Most recent): Lab Results  Component Value Date   WBC 6.0 06/09/2016   HGB 11.1 (L) 06/09/2016   HCT 35.9 (L) 06/09/2016   MCV 96.8 06/09/2016   PLT 184 06/09/2016   Chemistry (most recent): Lab Results  Component Value Date   NA 140 06/09/2016   K 3.7 06/09/2016   CL 109 06/09/2016   CO2 26 06/09/2016   BUN 8 06/09/2016   CREATININE 0.56 06/09/2016   Diabetic Labs (most recent): Lab Results  Component Value Date   HGBA1C 5.8 (H) 01/19/2016   Lipid Panel     Component Value Date/Time   CHOL 112 12/19/2007 0910   TRIG 177 (H) 12/19/2007 0910   HDL 30.2 (L) 12/19/2007 0910   CHOLHDL 3.7 CALC 12/19/2007 0910   VLDL 35 12/19/2007 0910    LDLCALC 46 12/19/2007 0910   Results for LINDSAY, FREVERT (MRN [id number]) as of 08/03/2016 10:04  Ref. Range 07/26/2016 11:34  TSH Latest Units: mIU/L  8.55 (H)  T4,Free(Direct) Latest Ref Range: 0.8 - 1.8 ng/dL 0.7 (L)    Thyroid uptake and scan at 15% uniform  Assessment & Plan:   1. Hyperthyroidism  - She did have adequate response to therapy with methimazole. Her labs are now showing over therapy with increased TSH above target.  - I will proceed to stop methimazole at this time. She will need repeat thyroid function test in 3 months with office visit.  2. Type 2 diabetes mellitus without complication, without long-term current use of insulin (HCC) -Controlled without medications. - She did not have recent A1c to review today.  3. Essential hypertension - Not on target however patient has multiple medications for high blood pressure. I advised her to be consistent in her medications.  4. Hyperlipidemia -She is advised to remain on atorvastatin 80 mg by mouth daily at bedtime.  - I advised patient to maintain close follow up with [name], MD for primary care needs.  Follow up plan: Return in about 3 months (around 11/02/2016) for follow up with pre-visit labs.  Glade Lloyd, MD Phone: [phone]  Fax: [phone]   08/03/2016, 10:10 AM

## 2016-08-05 ENCOUNTER — Other Ambulatory Visit: Payer: Self-pay | Admitting: Nurse Practitioner

## 2016-09-11 ENCOUNTER — Ambulatory Visit (INDEPENDENT_AMBULATORY_CARE_PROVIDER_SITE_OTHER): Payer: Medicare Other | Admitting: Internal Medicine

## 2016-09-11 ENCOUNTER — Encounter: Payer: Self-pay | Admitting: Internal Medicine

## 2016-09-11 VITALS — BP 116/58 | HR 95 | Ht 64.5 in | Wt 143.0 lb

## 2016-09-11 DIAGNOSIS — Z79899 Other long term (current) drug therapy: Secondary | ICD-10-CM

## 2016-09-11 MED ORDER — LISINOPRIL 5 MG PO TABS: 5.0000 mg | ORAL_TABLET | Freq: Every day | ORAL | 3 refills | Status: DC

## 2016-09-11 NOTE — Progress Notes (Signed)
HPI Katherine Larson returns today for evaluation of SVT and CHF. She is a pleasant 70 yo woman with an ICM, with patent LIMA to LAD, who had been referred to me to evaluate her for possible ICD. She had a repeat echo which demonstrated an EF of 35%. She has not yet been on an ACE inhibitor as her BP had been a little soft. She denies syncope and has had no chest pain. Her heart palpitations are quiet.    Current Outpatient Prescriptions  Medication Sig Dispense Refill  . acetaminophen (TYLENOL) 325 MG tablet Take 325 mg by mouth every 6 (six) hours as needed for mild pain.     Marland Kitchen albuterol (PROVENTIL HFA;VENTOLIN HFA) 108 (90 Base) MCG/ACT inhaler Take 2 puffs 3 times a day and every 4 hours as needed. (Patient taking differently: Inhale into the lungs. Take 2 puffs 3 times a day and every 4 hours as needed.)    . allopurinol (ZYLOPRIM) 300 MG tablet Take 300 mg by mouth daily.    Marland Kitchen amitriptyline (ELAVIL) 10 MG tablet Take 20 mg by mouth at bedtime.     Marland Kitchen aspirin EC 81 MG EC tablet Take 1 tablet (81 mg total) by mouth daily. (Patient taking differently: Take 81 mg by mouth every morning. )    . atorvastatin (LIPITOR) 80 MG tablet Take 80 mg by mouth daily.    . carvedilol (COREG) 25 MG tablet Take 1 tablet (25 mg total) by mouth 2 (two) times daily. 180 tablet 3  . docusate sodium (COLACE) 100 MG capsule Take 1 capsule (100 mg total) by mouth every 12 (twelve) hours. 60 capsule 1  . fexofenadine (ALLEGRA) 180 MG tablet Take 180 mg by mouth daily.      . fluticasone (FLONASE) 50 MCG/ACT nasal spray Place 2 sprays into both nostrils daily.    . furosemide (LASIX) 20 MG tablet Take 20 mg by mouth 2 (two) times daily.    Marland Kitchen HYDROcodone-acetaminophen (NORCO) 7.5-325 MG tablet Take 1 tablet by mouth every 4 (four) hours as needed for moderate pain. 50 tablet 0  . hydroxychloroquine (PLAQUENIL) 200 MG tablet Take 400 mg by mouth daily.    . iron polysaccharides (NIFEREX) 150 MG capsule Take 150 mg by mouth  daily.    Marland Kitchen leflunomide (ARAVA) 20 MG tablet Take 20 mg by mouth daily.    . magnesium oxide (MAG-OX) 400 MG tablet Take 1 tablet (400 mg total) by mouth daily. 30 tablet 11  . metoCLOPramide (REGLAN) 5 MG tablet Take 10 mg by mouth at bedtime.     . montelukast (SINGULAIR) 10 MG tablet Take 10 mg by mouth at bedtime.    . Multiple Vitamins-Minerals (MACULAR VITAMIN BENEFIT PO) Take 2 tablets by mouth daily.    . nitroGLYCERIN (NITROSTAT) 0.4 MG SL tablet Place 0.4 mg under the tongue every 5 (five) minutes as needed for chest pain. Reported on 03/24/2016    . Omega-3 Fatty Acids (FISH OIL) 1000 MG CAPS Take 1,000 mg by mouth 3 (three) times daily with meals. Reported on 03/24/2016    . omeprazole (PRILOSEC) 20 MG capsule Take 20 mg by mouth 2 (two) times daily before a meal. Reported on 03/24/2016    . omeprazole (PRILOSEC) 20 MG capsule TAKE 1 CAPSULE(20 MG) BY MOUTH TWICE DAILY BEFORE A MEAL 60 capsule 5  . Polyethyl Glycol-Propyl Glycol (SYSTANE OP) Apply 1 drop to eye 3 (three) times daily as needed (Dry Eyes).    . polyethylene glycol (  MIRALAX) packet Take 17 g by mouth daily. (Patient taking differently: Take 17 g by mouth daily as needed for mild constipation. ) 60 each 1  . potassium chloride SA (K-DUR,KLOR-CON) 20 MEQ tablet Take 1 tablet (20 mEq total) by mouth 2 (two) times daily. (Patient taking differently: Take 20 mEq by mouth 2 (two) times daily as needed (when taking Lasix). ) 40 tablet 11  . sucralfate (CARAFATE) 1 g tablet TAKE 1 TABLET BY MOUTH FOUR TIMES DAILY. CRUSH PILLS AND ADD TO A SMALL AMOUNT OF WATER OR APPLESAUSCE 120 tablet 0  . triamcinolone cream (KENALOG) 0.1 % Apply 1 application topically daily as needed (for irritation).      No current facility-administered medications for this visit.      Past Medical History:  Diagnosis Date  . Anemia    anemia of chronic disease +/- IDA followed by Dr. Earlie Server. received iron infusions in the past  . Asthma   . Borderline  glaucoma   . CAD (coronary artery disease)    a. s/p CABG 1996. b. low risk nuc 2011. c. LHC 04/2016 due to drop in EF -> occluded native LAD,  widely patent sequential LIMA-D1-LAD.  Marland Kitchen Chronic kidney disease    "mild stage of kidney disease"  . Chronic systolic CHF (congestive heart failure) ([place])   . Complication of anesthesia   . DM2 (diabetes mellitus, type 2) ([place])    not on any medicine for this at this time, 05/2016  . GERD (gastroesophageal reflux disease)   . Gout   . History of hiatal hernia    in 42s  . History of pneumonia    March, 2016  . HLD (hyperlipidemia)   . HTN (hypertension)   . Hyperthyroidism 01/21/2016  . Inappropriate sinus tachycardia   . LBBB (left bundle branch block)    a. Seen in 04/2016  . Myocardial infarction    1996  . Neuropathy ([place])   . NICM (nonischemic cardiomyopathy) ([place])    a. Dx 04/2016 - EF 25-30%, diffuse HK, elevated LVEDP, mild MR, mod LAE.  Marland Kitchen NSVT (nonsustained ventricular tachycardia) ([place])   . PAT (paroxysmal atrial tachycardia) ([place])   . PONV (postoperative nausea and vomiting)    "after just about every surgery I've had"  . Rheumatoid arthritis (HCC)    RA  . Seasonal allergies     ROS:   All systems reviewed and negative except as noted in the HPI.   Past Surgical History:  Procedure Laterality Date  . ABDOMINAL HYSTERECTOMY    . APPENDECTOMY    . BACK SURGERY    . CARDIAC CATHETERIZATION N/A 05/01/2016   Procedure: Right/Left Heart Cath and Coronary/Graft Angiography;  Surgeon: Leonie Man, MD;  Location: [facility];  Service: Cardiovascular;  Laterality: N/A;  . CHOLECYSTECTOMY    . COLONOSCOPY  11/2011   Dr. Magod:ext/int hemorrhoids, diverticulosis sigmoid colon and distal desc colon, mid-desc colon hyperplastic polyps.   . CORONARY ARTERY BYPASS GRAFT  1996   2 vessels  . ESOPHAGOGASTRODUODENOSCOPY  11/2011   Dr. Watt Climes: small hiatal hernia, one non-bleeding superficial gastric ulcer, medium-sized  diverticulum in area of papilla  . ESOPHAGOGASTRODUODENOSCOPY N/A 12/29/2015   Dr. Gala Romney: Chronic inactive gastritis, normal esophagus status post dilation, duodenal diverticula  . EYE SURGERY Bilateral   . FRACTURE SURGERY Right    arm  . GIVENS CAPSULE STUDY  11/2012   Dr. Watt Climes: minimal gastritis, normal small bowel capsule  . HERNIA REPAIR  hiatal hernia  . MAXIMUM ACCESS (MAS)POSTERIOR LUMBAR INTERBODY FUSION (PLIF) 1 LEVEL N/A 08/14/2013   Procedure:  MAXIMUM ACCESS SURGERY(MAS) POSTERIOR LUMBAR INTERBODY FUSION LUMBAR THREE-FOUR ;  Surgeon: Eustace Moore, MD;  Location: [place];  Service: Neurosurgery;  Laterality: N/A;   MAXIMUM ACCESS SURGERY(MAS) POSTERIOR LUMBAR INTERBODY FUSION LUMBAR THREE-FOUR   . PTCA    . SPINAL CORD STIMULATOR INSERTION N/A 06/16/2016   Procedure: LUMBAR SPINAL CORD STIMULATOR INSERTION;  Surgeon: Clydell Hakim, MD;  Location: [place] [facility];  Service: Neurosurgery;  Laterality: N/A;  LUMBAR SPINAL CORD STIMULATOR INSERTION  . tens  05/2016  . TONSILLECTOMY       Family History  Problem Relation Age of Onset  . Heart attack Father   . Heart attack Mother   . Bladder Cancer Brother   . Cervical cancer Daughter     ?  . Colon cancer Neg Hx      Social History   Social History  . Marital status: Married    Spouse name: N/A  . Number of children: 3  . Years of education: N/A   Occupational History  . Not on file.   Social History Main Topics  . Smoking status: Former Smoker    Years: 15.00    Quit date: 11/20/1994  . Smokeless tobacco: Never Used     Comment: Quit in 1996  . Alcohol use No  . Drug use: No  . Sexual activity: Not on file   Other Topics Concern  . Not on file   Social History Narrative   2 living children, one deceased age 79        BP (!) 116/58   Pulse 95   Ht 5' 4.5" (1.638 m)   Wt 143 lb (64.9 kg)   SpO2 96%   BMI 24.17 kg/m   Physical Exam:  Well appearing 70 yo woman, NAD HEENT: Unremarkable Neck:   7 cm JVD, no thyromegally Back:  No CVA tenderness Lungs:  Clear with no wheezes, rales, or rhonchi. HEART:  Regular rate rhythm, no murmurs, no rubs, no clicks Abd:  soft, positive bowel sounds, no organomegally, no rebound, no guarding Ext:  2 plus pulses, no edema, no cyanosis, no clubbing Skin:  No rashes no nodules Neuro:  CN II through XII intact, motor grossly intact   Assess/Plan: 1. Chronic systolic heart failure - she was found to have severe LV dysfunction at her hospitalization several month ago. She is on medical therapy and her symptoms are stable class 2. I have asked her to start lisinopril. Hopefully her soft BP will be ok. We will plan to repeat her echo in 4 months. 2. Atrial tachycardia - she had very little atrial tachycardia when she last wore a cardiac monitor. No additional treatment at this time. 3. Diarrhea - this is chronic but appears to be stable. I suspect the norco is helping this.  Mikle Bosworth.D.

## 2016-09-11 NOTE — Patient Instructions (Signed)
Your physician wants you to follow-up in: February with Dr. [name]. You will receive a reminder letter in the mail two months in advance. If you don't receive a letter, please call our office to schedule the follow-up appointment.   Your physician has recommended you make the following change in your medication:   Lisinopril 5 mg Daily   Your physician recommends that you return for lab work in:2 Weeks  BNP   If you need a refill on your cardiac medications before your next appointment, please call your pharmacy.  Thank you for choosing [facility]!

## 2016-09-25 ENCOUNTER — Other Ambulatory Visit (HOSPITAL_COMMUNITY): Payer: Medicare Other

## 2016-11-07 ENCOUNTER — Ambulatory Visit: Payer: Medicare Other | Admitting: "Endocrinology

## 2016-11-22 ENCOUNTER — Other Ambulatory Visit: Payer: Self-pay | Admitting: "Endocrinology

## 2016-11-22 LAB — COMPREHENSIVE METABOLIC PANEL
ALT: 16 U/L (ref 6–29)
AST: 16 U/L (ref 10–35)
Albumin: 4.1 g/dL (ref 3.6–5.1)
Alkaline Phosphatase: 80 U/L (ref 33–130)
BUN: 25 mg/dL (ref 7–25)
CO2: 24 mmol/L (ref 20–31)
Calcium: 9 mg/dL (ref 8.6–10.4)
Chloride: 104 mmol/L (ref 98–110)
Creat: 0.84 mg/dL (ref 0.50–0.99)
Glucose, Bld: 101 mg/dL — ABNORMAL HIGH (ref 65–99)
Potassium: 4.9 mmol/L (ref 3.5–5.3)
Sodium: 138 mmol/L (ref 135–146)
Total Bilirubin: 0.6 mg/dL (ref 0.2–1.2)
Total Protein: 6.3 g/dL (ref 6.1–8.1)

## 2016-11-22 LAB — T4, FREE: Free T4: 1 ng/dL (ref 0.8–1.8)

## 2016-11-22 LAB — HEMOGLOBIN A1C
Hgb A1c MFr Bld: 5.5 % (ref <5.7)
Mean Plasma Glucose: 111 mg/dL

## 2016-11-22 LAB — TSH: TSH: 0.69 mIU/L

## 2016-11-28 ENCOUNTER — Ambulatory Visit (INDEPENDENT_AMBULATORY_CARE_PROVIDER_SITE_OTHER): Payer: Medicare Other | Admitting: "Endocrinology

## 2016-11-28 ENCOUNTER — Encounter: Payer: Self-pay | Admitting: "Endocrinology

## 2016-11-28 VITALS — BP 113/71 | HR 80 | Ht 64.5 in | Wt 148.0 lb

## 2016-11-28 DIAGNOSIS — E0590 Thyrotoxicosis, unspecified without thyrotoxic crisis or storm: Secondary | ICD-10-CM | POA: Diagnosis not present

## 2016-11-28 DIAGNOSIS — E119 Type 2 diabetes mellitus without complications: Secondary | ICD-10-CM

## 2016-11-28 NOTE — Progress Notes (Signed)
Subjective:    Patient ID: Katherine Larson, female    DOB: 1946/06/03, PCP [name], MD.   Past Medical History:  Diagnosis Date  . Anemia    anemia of chronic disease +/- IDA followed by Dr. Earlie Server. received iron infusions in the past  . Asthma   . Borderline glaucoma   . CAD (coronary artery disease)    a. s/p CABG 1996. b. low risk nuc 2011. c. LHC 04/2016 due to drop in EF -> occluded native LAD,  widely patent sequential LIMA-D1-LAD.  Marland Kitchen Chronic kidney disease    "mild stage of kidney disease"  . Chronic systolic CHF (congestive heart failure) ([place])   . Complication of anesthesia   . DM2 (diabetes mellitus, type 2) ([place])    not on any medicine for this at this time, 05/2016  . GERD (gastroesophageal reflux disease)   . Gout   . History of hiatal hernia    in 69s  . History of pneumonia    March, 2016  . HLD (hyperlipidemia)   . HTN (hypertension)   . Hyperthyroidism 01/21/2016  . Inappropriate sinus tachycardia   . LBBB (left bundle branch block)    a. Seen in 04/2016  . Myocardial infarction    1996  . Neuropathy ([place])   . NICM (nonischemic cardiomyopathy) ([place])    a. Dx 04/2016 - EF 25-30%, diffuse HK, elevated LVEDP, mild MR, mod LAE.  Marland Kitchen NSVT (nonsustained ventricular tachycardia) ([place])   . PAT (paroxysmal atrial tachycardia) ([place])   . PONV (postoperative nausea and vomiting)    "after just about every surgery I've had"  . Rheumatoid arthritis (HCC)    RA  . Seasonal allergies    Past Surgical History:  Procedure Laterality Date  . ABDOMINAL HYSTERECTOMY    . APPENDECTOMY    . BACK SURGERY    . CARDIAC CATHETERIZATION N/A 05/01/2016   Procedure: Right/Left Heart Cath and Coronary/Graft Angiography;  Surgeon: Leonie Man, MD;  Location: [facility];  Service: Cardiovascular;  Laterality: N/A;  . CHOLECYSTECTOMY    . COLONOSCOPY  11/2011   Dr. Magod:ext/int hemorrhoids, diverticulosis sigmoid colon and distal desc colon, mid-desc colon  hyperplastic polyps.   . CORONARY ARTERY BYPASS GRAFT  1996   2 vessels  . ESOPHAGOGASTRODUODENOSCOPY  11/2011   Dr. Watt Climes: small hiatal hernia, one non-bleeding superficial gastric ulcer, medium-sized diverticulum in area of papilla  . ESOPHAGOGASTRODUODENOSCOPY N/A 12/29/2015   Dr. Gala Romney: Chronic inactive gastritis, normal esophagus status post dilation, duodenal diverticula  . EYE SURGERY Bilateral   . FRACTURE SURGERY Right    arm  . GIVENS CAPSULE STUDY  11/2012   Dr. Watt Climes: minimal gastritis, normal small bowel capsule  . HERNIA REPAIR     hiatal hernia  . MAXIMUM ACCESS (MAS)POSTERIOR LUMBAR INTERBODY FUSION (PLIF) 1 LEVEL N/A 08/14/2013   Procedure:  MAXIMUM ACCESS SURGERY(MAS) POSTERIOR LUMBAR INTERBODY FUSION LUMBAR THREE-FOUR ;  Surgeon: Eustace Moore, MD;  Location: [facility];  Service: Neurosurgery;  Laterality: N/A;   MAXIMUM ACCESS SURGERY(MAS) POSTERIOR LUMBAR INTERBODY FUSION LUMBAR THREE-FOUR   . PTCA    . SPINAL CORD STIMULATOR INSERTION N/A 06/16/2016   Procedure: LUMBAR SPINAL CORD STIMULATOR INSERTION;  Surgeon: Clydell Hakim, MD;  Location: [facility];  Service: Neurosurgery;  Laterality: N/A;  LUMBAR SPINAL CORD STIMULATOR INSERTION  . tens  05/2016  . TONSILLECTOMY     Social History   Social History  . Marital status: Married  Spouse name: N/A  . Number of children: 3  . Years of education: N/A   Social History Main Topics  . Smoking status: Former Smoker    Years: 15.00    Quit date: 11/20/1994  . Smokeless tobacco: Never Used     Comment: Quit in 1996  . Alcohol use No  . Drug use: No  . Sexual activity: Not Asked   Other Topics Concern  . None   Social History Narrative   2 living children, one deceased age 58      Outpatient Encounter Prescriptions as of 11/28/2016  Medication Sig  . acetaminophen (TYLENOL) 325 MG tablet Take 325 mg by mouth every 6 (six) hours as needed for mild pain.   Marland Kitchen albuterol (PROVENTIL HFA;VENTOLIN HFA) 108 (90 Base)  MCG/ACT inhaler Take 2 puffs 3 times a day and every 4 hours as needed. (Patient taking differently: Inhale into the lungs. Take 2 puffs 3 times a day and every 4 hours as needed.)  . allopurinol (ZYLOPRIM) 300 MG tablet Take 300 mg by mouth daily.  Marland Kitchen amitriptyline (ELAVIL) 10 MG tablet Take 20 mg by mouth at bedtime.   Marland Kitchen aspirin EC 81 MG EC tablet Take 1 tablet (81 mg total) by mouth daily. (Patient taking differently: Take 81 mg by mouth every morning. )  . atorvastatin (LIPITOR) 80 MG tablet Take 80 mg by mouth daily.  . carvedilol (COREG) 25 MG tablet Take 1 tablet (25 mg total) by mouth 2 (two) times daily.  Marland Kitchen docusate sodium (COLACE) 100 MG capsule Take 1 capsule (100 mg total) by mouth every 12 (twelve) hours.  . fexofenadine (ALLEGRA) 180 MG tablet Take 180 mg by mouth daily.    . fluticasone (FLONASE) 50 MCG/ACT nasal spray Place 2 sprays into both nostrils daily.  . furosemide (LASIX) 20 MG tablet Take 20 mg by mouth 2 (two) times daily.  Marland Kitchen HYDROcodone-acetaminophen (NORCO) 7.5-325 MG tablet Take 1 tablet by mouth every 4 (four) hours as needed for moderate pain.  . hydroxychloroquine (PLAQUENIL) 200 MG tablet Take 400 mg by mouth daily.  . iron polysaccharides (NIFEREX) 150 MG capsule Take 150 mg by mouth daily.  Marland Kitchen leflunomide (ARAVA) 20 MG tablet Take 20 mg by mouth daily.  Marland Kitchen lisinopril (PRINIVIL,ZESTRIL) 5 MG tablet Take 1 tablet (5 mg total) by mouth daily.  . magnesium oxide (MAG-OX) 400 MG tablet Take 1 tablet (400 mg total) by mouth daily.  . metoCLOPramide (REGLAN) 5 MG tablet Take 10 mg by mouth at bedtime.   . montelukast (SINGULAIR) 10 MG tablet Take 10 mg by mouth at bedtime.  . Multiple Vitamins-Minerals (MACULAR VITAMIN BENEFIT PO) Take 2 tablets by mouth daily.  . nitroGLYCERIN (NITROSTAT) 0.4 MG SL tablet Place 0.4 mg under the tongue every 5 (five) minutes as needed for chest pain. Reported on 03/24/2016  . Omega-3 Fatty Acids (FISH OIL) 1000 MG CAPS Take 1,000 mg by  mouth 3 (three) times daily with meals. Reported on 03/24/2016  . omeprazole (PRILOSEC) 20 MG capsule Take 20 mg by mouth 2 (two) times daily before a meal. Reported on 03/24/2016  . Polyethyl Glycol-Propyl Glycol (SYSTANE OP) Apply 1 drop to eye 3 (three) times daily as needed (Dry Eyes).  . polyethylene glycol (MIRALAX) packet Take 17 g by mouth daily. (Patient taking differently: Take 17 g by mouth daily as needed for mild constipation. )  . potassium chloride SA (K-DUR,KLOR-CON) 20 MEQ tablet Take 1 tablet (20 mEq total) by mouth 2 (  two) times daily. (Patient taking differently: Take 20 mEq by mouth 2 (two) times daily as needed (when taking Lasix). )  . sucralfate (CARAFATE) 1 g tablet TAKE 1 TABLET BY MOUTH FOUR TIMES DAILY. CRUSH PILLS AND ADD TO A SMALL AMOUNT OF WATER OR APPLESAUSCE  . triamcinolone cream (KENALOG) 0.1 % Apply 1 application topically daily as needed (for irritation).   . [DISCONTINUED] omeprazole (PRILOSEC) 20 MG capsule TAKE 1 CAPSULE(20 MG) BY MOUTH TWICE DAILY BEFORE A MEAL   No facility-administered encounter medications on file as of 11/28/2016.    ALLERGIES:  VACCINATION STATUS:  There is no immunization history on file for this patient.  HPI  Katherine Larson is a 71 y.o. female with hx of ventricular tachycardia, type 2 diabetes, coronary artery disease s/p CABG, hypertension, hyperlipidemia, asthma. - She is being seen in follow-up for recent history of hyperthyroidism status post therapy with methimazole. - She continued to feel better, reversed the weight that she lost. - She has no new complaints today.  She does have family history of thyroid problem in her mother. The patient denies personal history of goiter.   Review of Systems Constitutional: - steady weight since last visit, -  fatigue, - subjective hyperthermia Eyes: no blurry vision, no xerophthalmia ENT: no sore throat, no nodules palpated in throat, no dysphagia/odynophagia, no  hoarseness Cardiovascular: no CP, +SOB, +alpitations Respiratory: +cough, +SOB Gastrointestinal: no N/V/D/C Musculoskeletal: no muscle/joint aches Skin: no rashes Neurological: + tremors Psychiatric: no depression/anxiety Objective:    BP 113/71   Pulse 80   Ht 5' 4.5" (1.638 m)   Wt 148 lb (67.1 kg)   BMI 25.01 kg/m   Wt Readings from Last 3 Encounters:  11/28/16 148 lb (67.1 kg)  09/11/16 143 lb (64.9 kg)  08/03/16 136 lb 12.8 oz (62.1 kg)    Physical Exam   Constitutional:  in NAD Eyes: PERRLA, EOMI, no exophthalmos ENT: moist mucous membranes, no thyromegaly, no cervical lymphadenopathy Cardiovascular: RRR, No MRG Respiratory: CTA B Gastrointestinal: abdomen soft, NT, ND, BS+ Musculoskeletal: no deformities, strength intact in all 4 Skin: moist, warm, no rashes Neurological:  Mild tremor with outstretched hands Which is improving from last encounter, DTR are brisk in all Extremities.  Complete Blood Count (Most recent): Lab Results  Component Value Date   WBC 6.0 06/09/2016   HGB 11.1 (L) 06/09/2016   HCT 35.9 (L) 06/09/2016   MCV 96.8 06/09/2016   PLT 184 06/09/2016   Chemistry (most recent): Lab Results  Component Value Date   NA 138 11/22/2016   K 4.9 11/22/2016   CL 104 11/22/2016   CO2 24 11/22/2016   BUN 25 11/22/2016   CREATININE 0.84 11/22/2016   Diabetic Labs (most recent): Lab Results  Component Value Date   HGBA1C 5.5 11/22/2016   HGBA1C 5.8 (H) 01/19/2016   Lipid Panel     Component Value Date/Time   CHOL 112 12/19/2007 0910   TRIG 177 (H) 12/19/2007 0910   HDL 30.2 (L) 12/19/2007 0910   CHOLHDL 3.7 CALC 12/19/2007 0910   VLDL 35 12/19/2007 0910   LDLCALC 46 12/19/2007 0910    Results for XITLALY, HANDLER (MRN [id number]) as of 11/28/2016 11:41  Ref. Range 01/22/2016 06:10 04/29/2016 18:46 07/26/2016 11:34 11/22/2016 11:34  TSH Latest Units: mIU/L 0.129 (L) 1.793 8.55 (H) 0.69  Triiodothyronine,Free,Serum Latest Ref Range: 2.0 - 4.4  pg/mL 2.7     T4,Free(Direct) Latest Ref Range: 0.8 - 1.8 ng/dL 1.77 (H) 1.01 0.7 (  L) 1.0    Thyroid uptake and scan at 15% uniform  Assessment & Plan:   1. Hyperthyroidism  - She did have adequate response to therapy with methimazole. Her labs are now showing normal thyroid function test.  - I will proceed without medication at this time. She will need repeat thyroid function test in 6 months with office visit.  2. Type 2 diabetes mellitus without complication, without long-term current use of insulin (HCC) -Controlled without medications. -  Her labs shows that her A1c is 5.5%.  3. Essential hypertension - Not on target however patient has multiple medications for high blood pressure. I advised her to be consistent in her medications.  4. Hyperlipidemia -She is advised to remain on atorvastatin 80 mg by mouth daily at bedtime.  - I advised patient to maintain close follow up with [name], MD for primary care needs.  Follow up plan: Return in about 6 months (around 05/28/2017) for follow up with pre-visit labs.  Glade Lloyd, MD Phone: [phone]  Fax: [phone]   11/28/2016, 11:45 AM

## 2016-12-17 ENCOUNTER — Other Ambulatory Visit: Payer: Self-pay | Admitting: Gastroenterology

## 2016-12-26 ENCOUNTER — Other Ambulatory Visit: Payer: Self-pay | Admitting: Family Medicine

## 2016-12-26 DIAGNOSIS — Z1231 Encounter for screening mammogram for malignant neoplasm of breast: Secondary | ICD-10-CM

## 2017-01-15 ENCOUNTER — Encounter: Payer: Self-pay | Admitting: Internal Medicine

## 2017-01-15 ENCOUNTER — Ambulatory Visit (INDEPENDENT_AMBULATORY_CARE_PROVIDER_SITE_OTHER): Payer: Medicare Other | Admitting: Internal Medicine

## 2017-01-15 VITALS — BP 130/80 | HR 81 | Ht 64.5 in | Wt 151.0 lb

## 2017-01-15 DIAGNOSIS — I5041 Acute combined systolic (congestive) and diastolic (congestive) heart failure: Secondary | ICD-10-CM

## 2017-01-15 NOTE — Progress Notes (Signed)
HPI Katherine Larson returns today for evaluation of SVT and CHF. She is a pleasant 71 yo woman with an ICM, with patent LIMA to LAD, who had been referred to me to evaluate her for possible ICD. She had an echo which demonstrated an EF of 35%. She was initially not on maximal medical therapy but has been started on lisinopril. She feels well. No edema. No syncope and no chest pain.    Current Outpatient Prescriptions  Medication Sig Dispense Refill  . acetaminophen (TYLENOL) 325 MG tablet Take 325 mg by mouth every 6 (six) hours as needed for mild pain.     Marland Kitchen albuterol (PROVENTIL HFA;VENTOLIN HFA) 108 (90 Base) MCG/ACT inhaler Take 2 puffs 3 times a day and every 4 hours as needed. (Patient taking differently: Inhale into the lungs. Take 2 puffs 3 times a day and every 4 hours as needed.)    . allopurinol (ZYLOPRIM) 300 MG tablet Take 300 mg by mouth daily.    Marland Kitchen amitriptyline (ELAVIL) 10 MG tablet Take 20 mg by mouth at bedtime.     Marland Kitchen aspirin EC 81 MG EC tablet Take 1 tablet (81 mg total) by mouth daily. (Patient taking differently: Take 81 mg by mouth every morning. )    . atorvastatin (LIPITOR) 80 MG tablet Take 80 mg by mouth daily.    . carvedilol (COREG) 25 MG tablet Take 1 tablet (25 mg total) by mouth 2 (two) times daily. 180 tablet 3  . docusate sodium (COLACE) 100 MG capsule Take 1 capsule (100 mg total) by mouth every 12 (twelve) hours. 60 capsule 1  . fexofenadine (ALLEGRA) 180 MG tablet Take 180 mg by mouth daily.      . fluticasone (FLONASE) 50 MCG/ACT nasal spray Place 2 sprays into both nostrils daily.    . furosemide (LASIX) 20 MG tablet Take 20 mg by mouth 2 (two) times daily.    Marland Kitchen HYDROcodone-acetaminophen (NORCO) 7.5-325 MG tablet Take 1 tablet by mouth every 4 (four) hours as needed for moderate pain. 50 tablet 0  . hydroxychloroquine (PLAQUENIL) 200 MG tablet Take 400 mg by mouth daily.    . iron polysaccharides (NIFEREX) 150 MG capsule Take 150 mg by mouth daily.    Marland Kitchen  leflunomide (ARAVA) 20 MG tablet Take 20 mg by mouth daily.    . magnesium oxide (MAG-OX) 400 MG tablet Take 1 tablet (400 mg total) by mouth daily. 30 tablet 11  . metoCLOPramide (REGLAN) 5 MG tablet Take 10 mg by mouth at bedtime.     . montelukast (SINGULAIR) 10 MG tablet Take 10 mg by mouth at bedtime.    . Multiple Vitamins-Minerals (MACULAR VITAMIN BENEFIT PO) Take 2 tablets by mouth daily.    . nitroGLYCERIN (NITROSTAT) 0.4 MG SL tablet Place 0.4 mg under the tongue every 5 (five) minutes as needed for chest pain. Reported on 03/24/2016    . Omega-3 Fatty Acids (FISH OIL) 1000 MG CAPS Take 1,000 mg by mouth 3 (three) times daily with meals. Reported on 03/24/2016    . omeprazole (PRILOSEC) 20 MG capsule Take 20 mg by mouth 2 (two) times daily before a meal. Reported on 03/24/2016    . omeprazole (PRILOSEC) 20 MG capsule TAKE 1 CAPSULE(20 MG) BY MOUTH TWICE DAILY BEFORE A MEAL 60 capsule 3  . Polyethyl Glycol-Propyl Glycol (SYSTANE OP) Apply 1 drop to eye 3 (three) times daily as needed (Dry Eyes).    . polyethylene glycol (MIRALAX) packet Take 17 g by mouth  daily. (Patient taking differently: Take 17 g by mouth daily as needed for mild constipation. ) 60 each 1  . potassium chloride SA (K-DUR,KLOR-CON) 20 MEQ tablet Take 1 tablet (20 mEq total) by mouth 2 (two) times daily. (Patient taking differently: Take 20 mEq by mouth 2 (two) times daily as needed (when taking Lasix). ) 40 tablet 11  . sucralfate (CARAFATE) 1 g tablet TAKE 1 TABLET BY MOUTH FOUR TIMES DAILY. CRUSH PILLS AND ADD TO A SMALL AMOUNT OF WATER OR APPLESAUSCE 120 tablet 0  . triamcinolone cream (KENALOG) 0.1 % Apply 1 application topically daily as needed (for irritation).     Marland Kitchen lisinopril (PRINIVIL,ZESTRIL) 5 MG tablet Take 1 tablet (5 mg total) by mouth daily. 90 tablet 3   No current facility-administered medications for this visit.      Past Medical History:  Diagnosis Date  . Anemia    anemia of chronic disease +/- IDA  followed by Dr. Earlie Server. received iron infusions in the past  . Asthma   . Borderline glaucoma   . CAD (coronary artery disease)    a. s/p CABG 1996. b. low risk nuc 2011. c. LHC 04/2016 due to drop in EF -> occluded native LAD,  widely patent sequential LIMA-D1-LAD.  Marland Kitchen Chronic kidney disease    "mild stage of kidney disease"  . Chronic systolic CHF (congestive heart failure) ([place])   . Complication of anesthesia   . DM2 (diabetes mellitus, type 2) ([place])    not on any medicine for this at this time, 05/2016  . GERD (gastroesophageal reflux disease)   . Gout   . History of hiatal hernia    in 59s  . History of pneumonia    March, 2016  . HLD (hyperlipidemia)   . HTN (hypertension)   . Hyperthyroidism 01/21/2016  . Inappropriate sinus tachycardia   . LBBB (left bundle branch block)    a. Seen in 04/2016  . Myocardial infarction    1996  . Neuropathy ([place])   . NICM (nonischemic cardiomyopathy) ([place])    a. Dx 04/2016 - EF 25-30%, diffuse HK, elevated LVEDP, mild MR, mod LAE.  Marland Kitchen NSVT (nonsustained ventricular tachycardia) ([place])   . PAT (paroxysmal atrial tachycardia) ([place])   . PONV (postoperative nausea and vomiting)    "after just about every surgery I've had"  . Rheumatoid arthritis (HCC)    RA  . Seasonal allergies     ROS:   All systems reviewed and negative except as noted in the HPI.   Past Surgical History:  Procedure Laterality Date  . ABDOMINAL HYSTERECTOMY    . APPENDECTOMY    . BACK SURGERY    . CARDIAC CATHETERIZATION N/A 05/01/2016   Procedure: Right/Left Heart Cath and Coronary/Graft Angiography;  Surgeon: Leonie Man, MD;  Location: [facility];  Service: Cardiovascular;  Laterality: N/A;  . CHOLECYSTECTOMY    . COLONOSCOPY  11/2011   Dr. Magod:ext/int hemorrhoids, diverticulosis sigmoid colon and distal desc colon, mid-desc colon hyperplastic polyps.   . CORONARY ARTERY BYPASS GRAFT  1996   2 vessels  . ESOPHAGOGASTRODUODENOSCOPY  11/2011   Dr.  Watt Climes: small hiatal hernia, one non-bleeding superficial gastric ulcer, medium-sized diverticulum in area of papilla  . ESOPHAGOGASTRODUODENOSCOPY N/A 12/29/2015   Dr. Gala Romney: Chronic inactive gastritis, normal esophagus status post dilation, duodenal diverticula  . EYE SURGERY Bilateral   . FRACTURE SURGERY Right    arm  . GIVENS CAPSULE STUDY  11/2012   Dr. Watt Climes: minimal  gastritis, normal small bowel capsule  . HERNIA REPAIR     hiatal hernia  . MAXIMUM ACCESS (MAS)POSTERIOR LUMBAR INTERBODY FUSION (PLIF) 1 LEVEL N/A 08/14/2013   Procedure:  MAXIMUM ACCESS SURGERY(MAS) POSTERIOR LUMBAR INTERBODY FUSION LUMBAR THREE-FOUR ;  Surgeon: Eustace Moore, MD;  Location: [facility];  Service: Neurosurgery;  Laterality: N/A;   MAXIMUM ACCESS SURGERY(MAS) POSTERIOR LUMBAR INTERBODY FUSION LUMBAR THREE-FOUR   . PTCA    . SPINAL CORD STIMULATOR INSERTION N/A 06/16/2016   Procedure: LUMBAR SPINAL CORD STIMULATOR INSERTION;  Surgeon: Clydell Hakim, MD;  Location: [facility];  Service: Neurosurgery;  Laterality: N/A;  LUMBAR SPINAL CORD STIMULATOR INSERTION  . tens  05/2016  . TONSILLECTOMY       Family History  Problem Relation Age of Onset  . Heart attack Father   . Heart attack Mother   . Bladder Cancer Brother   . Cervical cancer Daughter     ?  . Colon cancer Neg Hx      Social History   Social History  . Marital status: Married    Spouse name: N/A  . Number of children: 3  . Years of education: N/A   Occupational History  . Not on file.   Social History Main Topics  . Smoking status: Former Smoker    Years: 15.00    Quit date: 11/20/1994  . Smokeless tobacco: Never Used     Comment: Quit in 1996  . Alcohol use No  . Drug use: No  . Sexual activity: Not on file   Other Topics Concern  . Not on file   Social History Narrative   2 living children, one deceased age 49        BP 130/80   Pulse 81   Ht 5' 4.5" (1.638 m)   Wt 151 lb (68.5 kg)   SpO2 98%   BMI 25.52 kg/m    Physical Exam:  Well appearing 71 yo woman, NAD HEENT: Unremarkable Neck:  7 cm JVD, no thyromegally Back:  No CVA tenderness Lungs:  Clear with no wheezes, rales, or rhonchi. HEART:  Regular rate rhythm, no murmurs, no rubs, no clicks Abd:  soft, positive bowel sounds, no organomegally, no rebound, no guarding Ext:  2 plus pulses, no edema, no cyanosis, no clubbing Skin:  No rashes no nodules Neuro:  CN II through XII intact, motor grossly intact  ECG - reviewed - nsr  Assess/Plan: 1. Chronic systolic heart failure - she is well compensated and on maximal medical therapy. Will recheck her 2D echo. If her EF is less than or equal to 35%, will plan to place an ICD.  2. Atrial tachycardia - she had very little atrial tachycardia when she last wore a cardiac monitor. No additional treatment at this time. She is currently asymptomatic. 3. Diarrhea - this is chronic but appears to be stable. I suspect the norco is helping this.  Mikle Bosworth.D.

## 2017-01-15 NOTE — Patient Instructions (Signed)
Your physician recommends that you schedule a follow-up appointment based on test results   Your physician recommends that you continue on your current medications as directed. Please refer to the Current Medication list given to you today.  Your physician has requested that you have an echocardiogram. Echocardiography is a painless test that uses sound waves to create images of your heart. It provides your doctor with information about the size and shape of your heart and how well your heart's chambers and valves are working. This procedure takes approximately one hour. There are no restrictions for this procedure.  If you need a refill on your cardiac medications before your next appointment, please call your pharmacy.  Thank you for choosing [facility]!

## 2017-01-19 ENCOUNTER — Ambulatory Visit (HOSPITAL_COMMUNITY)
Admission: RE | Admit: 2017-01-19 | Discharge: 2017-01-19 | Disposition: A | Discharge status: Home / Self Care | Payer: Medicare Other | Source: Ambulatory Visit | Attending: Internal Medicine | Admitting: Internal Medicine

## 2017-01-19 DIAGNOSIS — I110 Hypertensive heart disease with heart failure: Secondary | ICD-10-CM | POA: Diagnosis not present

## 2017-01-19 DIAGNOSIS — E119 Type 2 diabetes mellitus without complications: Secondary | ICD-10-CM | POA: Insufficient documentation

## 2017-01-19 DIAGNOSIS — I358 Other nonrheumatic aortic valve disorders: Secondary | ICD-10-CM | POA: Insufficient documentation

## 2017-01-19 DIAGNOSIS — Z951 Presence of aortocoronary bypass graft: Secondary | ICD-10-CM | POA: Diagnosis not present

## 2017-01-19 DIAGNOSIS — I5041 Acute combined systolic (congestive) and diastolic (congestive) heart failure: Secondary | ICD-10-CM | POA: Diagnosis not present

## 2017-01-19 DIAGNOSIS — I429 Cardiomyopathy, unspecified: Secondary | ICD-10-CM | POA: Diagnosis present

## 2017-01-19 DIAGNOSIS — E785 Hyperlipidemia, unspecified: Secondary | ICD-10-CM | POA: Diagnosis not present

## 2017-01-19 DIAGNOSIS — I348 Other nonrheumatic mitral valve disorders: Secondary | ICD-10-CM | POA: Insufficient documentation

## 2017-01-19 LAB — ECHOCARDIOGRAM COMPLETE
E decel time: 246 msec
E/e' ratio: 11.28
FS: 25 % — AB (ref 28–44)
IVS/LV PW RATIO, ED: 0.99
LA ID, A-P, ES: 36 mm
LA diam end sys: 36 mm
LA diam index: 2.06 cm/m2
LA vol A4C: 39.3 ml
LA vol index: 23.5 mL/m2
LA vol: 41.2 mL
LV E/e' medial: 11.28
LV E/e'average: 11.28
LV PW d: 10.1 mm — AB (ref 0.6–1.1)
LV dias vol index: 43 mL/m2
LV dias vol: 76 mL (ref 46–106)
LV e' LATERAL: 7.18 cm/s
LV sys vol index: 20 mL/m2
LV sys vol: 35 mL (ref 14–42)
LVOT SV: 54 mL
LVOT VTI: 21.1 cm
LVOT area: 2.54 cm2
LVOT diameter: 18 mm
LVOT peak grad rest: 3 mmHg
LVOT peak vel: 90.1 cm/s
Lateral S' vel: 9.14 cm/s
MV Dec: 246
MV Peak grad: 3 mmHg
MV pk A vel: 114 m/s
MV pk E vel: 81 m/s
RV sys press: 16 mmHg
Reg peak vel: 180 cm/s
Simpson's disk: 54
Stroke v: 41 ml
TAPSE: 14.6 mm
TDI e' lateral: 7.18
TDI e' medial: 6.42
TR max vel: 180 cm/s

## 2017-01-19 NOTE — Progress Notes (Signed)
*  PRELIMINARY RESULTS* Echocardiogram 2D Echocardiogram has been performed.  Katherine Larson 01/19/2017, 11:05 AM

## 2017-01-25 ENCOUNTER — Ambulatory Visit
Admission: RE | Admit: 2017-01-25 | Discharge: 2017-01-25 | Disposition: A | Discharge status: Home / Self Care | Payer: Medicare Other | Source: Ambulatory Visit | Attending: Family Medicine | Admitting: Family Medicine

## 2017-01-25 DIAGNOSIS — Z1231 Encounter for screening mammogram for malignant neoplasm of breast: Secondary | ICD-10-CM

## 2017-02-16 IMAGING — DX DG CHEST 2V
2 series · 2 of 2 positions shown · non-contrast
Comparison: 01/18/2016

CLINICAL DATA: Shortness of breath, pneumonia

EXAM:
CHEST  2 VIEW

[chest pa]
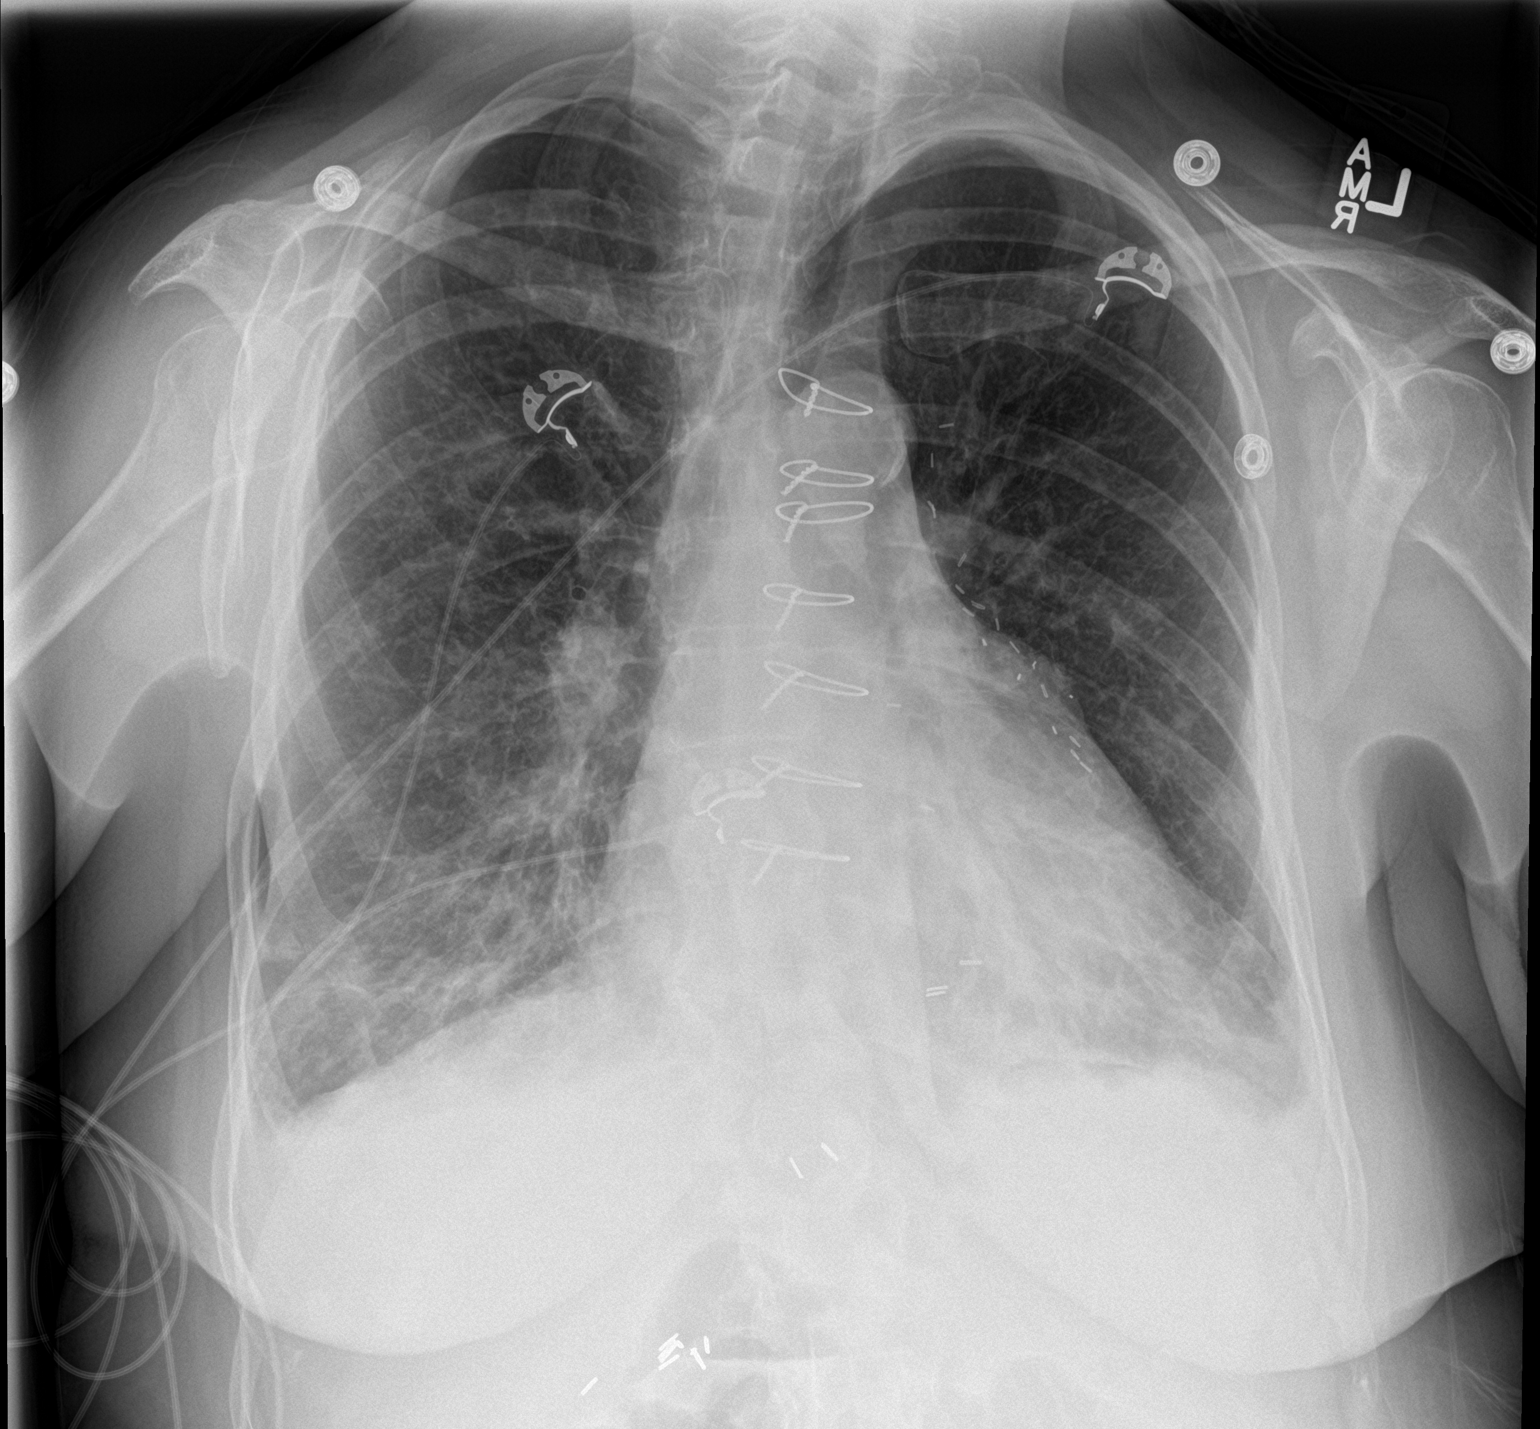

[chest lat]
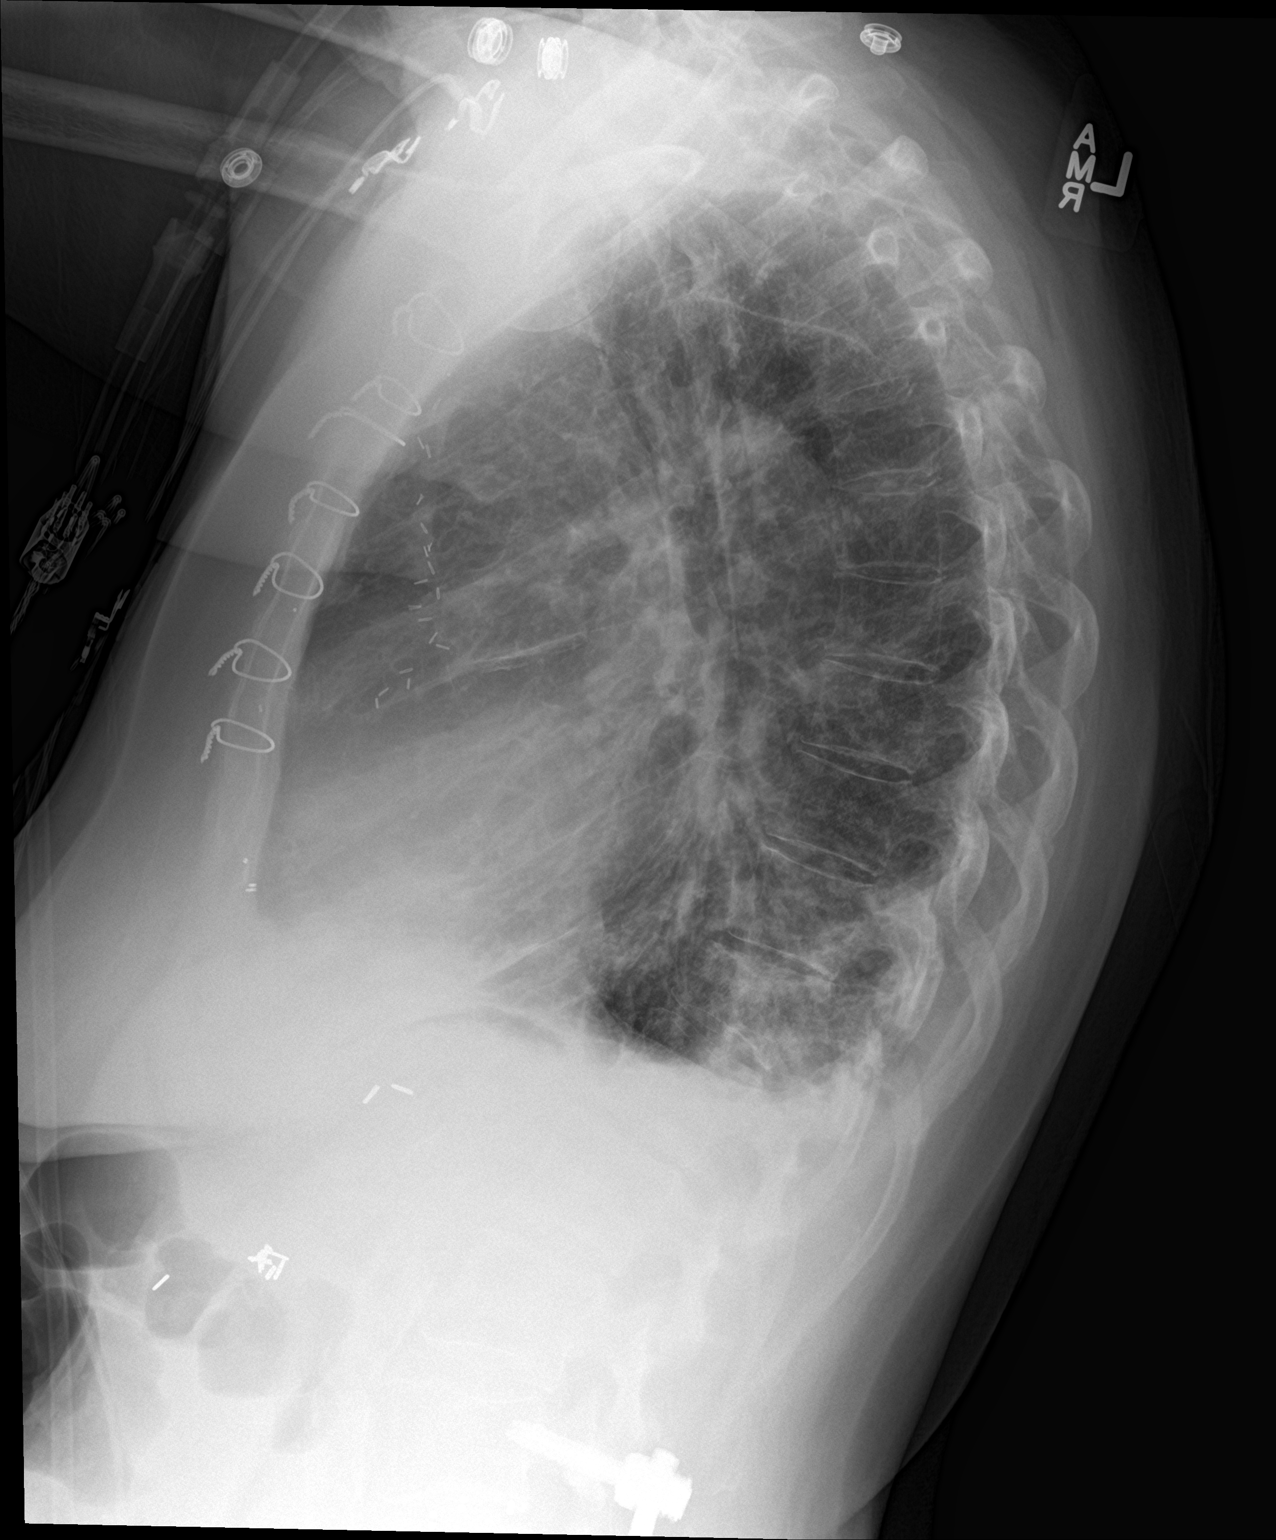

[2 of 2 positions shown; findings below may reference images not displayed]

FINDINGS: Mild patchy bilateral lower lobe opacities, suspicious for
pneumonia, unchanged. Possible small left pleural effusion. No
pneumothorax.

The heart is normal in size. Postsurgical changes related to prior
CABG.

Visualized osseous structures are within normal limits.
IMPRESSION: Mild patchy bilateral lower lobe opacities, suspicious for
pneumonia, unchanged. Possible small left pleural effusion.

## 2017-03-25 ENCOUNTER — Encounter: Payer: Self-pay | Admitting: Internal Medicine

## 2017-03-27 MED ORDER — CARVEDILOL 12.5 MG PO TABS
12.5000 mg | ORAL_TABLET | Freq: Two times a day (BID) | ORAL | 6 refills | Status: DC
Start: 2017-03-27 — End: 2017-05-03

## 2017-03-27 NOTE — Telephone Encounter (Signed)
Advised patient to decrease Carvedilol to 12.5 BID per Dr. [name]. Asked pt to let office know if this does not improve BPs.  Patient verbalized understanding and agreeable to plan.

## 2017-04-14 ENCOUNTER — Other Ambulatory Visit: Payer: Self-pay | Admitting: Gastroenterology

## 2017-05-03 ENCOUNTER — Other Ambulatory Visit: Payer: Self-pay

## 2017-05-03 MED ORDER — CARVEDILOL 12.5 MG PO TABS: 12.5000 mg | ORAL_TABLET | Freq: Two times a day (BID) | ORAL | 3 refills | Status: DC

## 2017-05-03 MED ORDER — LISINOPRIL 5 MG PO TABS: 5.0000 mg | ORAL_TABLET | Freq: Every day | ORAL | 3 refills | Status: DC

## 2017-05-03 NOTE — Telephone Encounter (Signed)
Refilled per fax to [facility]

## 2017-05-10 ENCOUNTER — Other Ambulatory Visit: Payer: Self-pay | Admitting: Physician Assistant

## 2017-05-24 ENCOUNTER — Other Ambulatory Visit: Payer: Self-pay | Admitting: "Endocrinology

## 2017-05-24 LAB — COMPREHENSIVE METABOLIC PANEL
ALT: 13 U/L (ref 6–29)
AST: 15 U/L (ref 10–35)
Albumin: 4.2 g/dL (ref 3.6–5.1)
Alkaline Phosphatase: 91 U/L (ref 33–130)
BUN: 17 mg/dL (ref 7–25)
CO2: 26 mmol/L (ref 20–31)
Calcium: 9 mg/dL (ref 8.6–10.4)
Chloride: 101 mmol/L (ref 98–110)
Creat: 0.82 mg/dL (ref 0.50–0.99)
Glucose, Bld: 92 mg/dL (ref 65–99)
Potassium: 4.4 mmol/L (ref 3.5–5.3)
Sodium: 137 mmol/L (ref 135–146)
Total Bilirubin: 0.5 mg/dL (ref 0.2–1.2)
Total Protein: 6.2 g/dL (ref 6.1–8.1)

## 2017-05-24 LAB — TSH: TSH: 0.56 mIU/L

## 2017-05-24 LAB — T4, FREE: Free T4: 1 ng/dL (ref 0.8–1.8)

## 2017-05-25 LAB — HEMOGLOBIN A1C
Hgb A1c MFr Bld: 5.6 % (ref <5.7)
Mean Plasma Glucose: 114 mg/dL

## 2017-05-26 IMAGING — DX DG CHEST 2V
2 series · 2 of 2 positions shown · non-contrast
Comparison: Chest radiograph performed 02/07/2016

CLINICAL DATA: Acute onset of shortness of breath and upper
abdominal swelling. Initial encounter.

EXAM:
CHEST  2 VIEW

[chest pa]
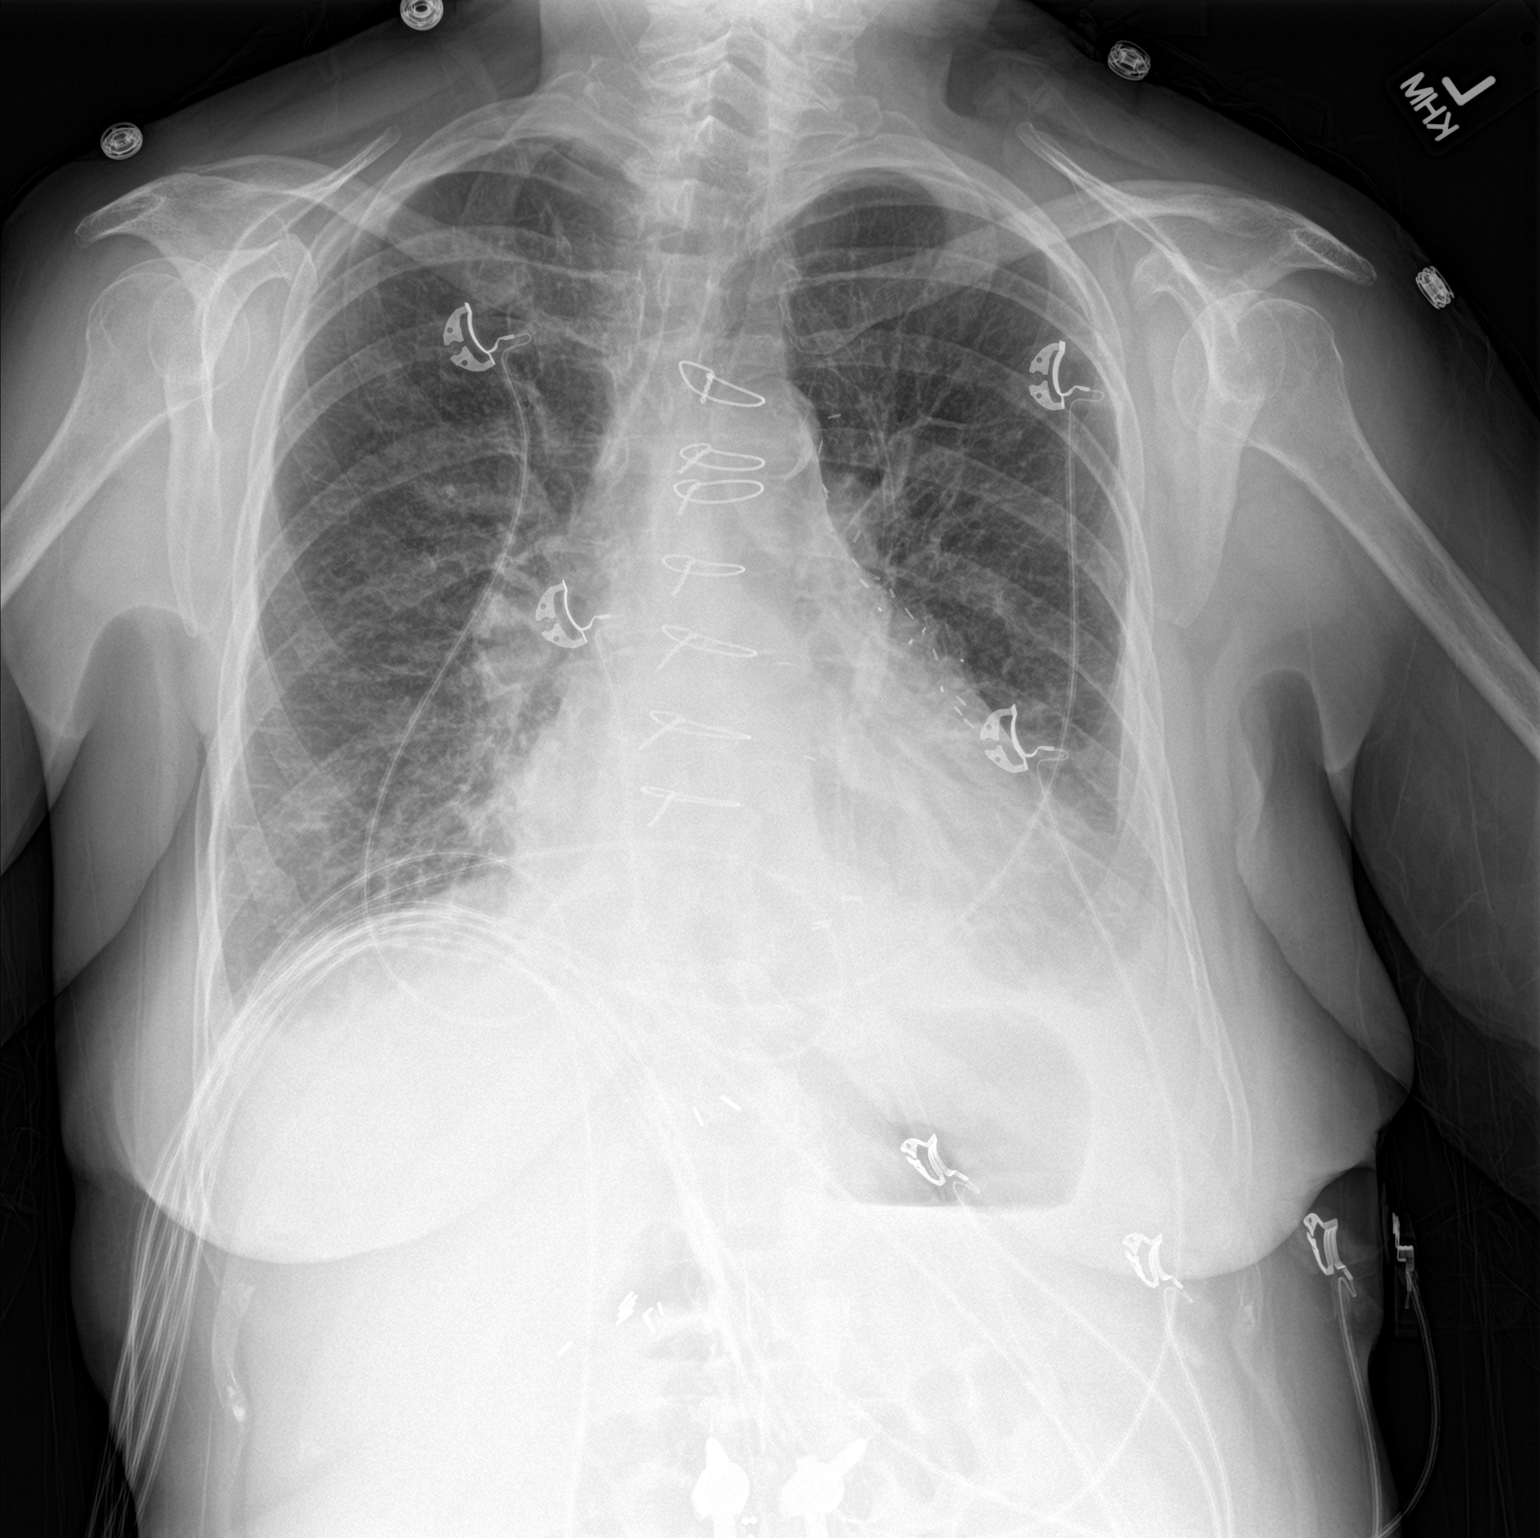

[chest lat]
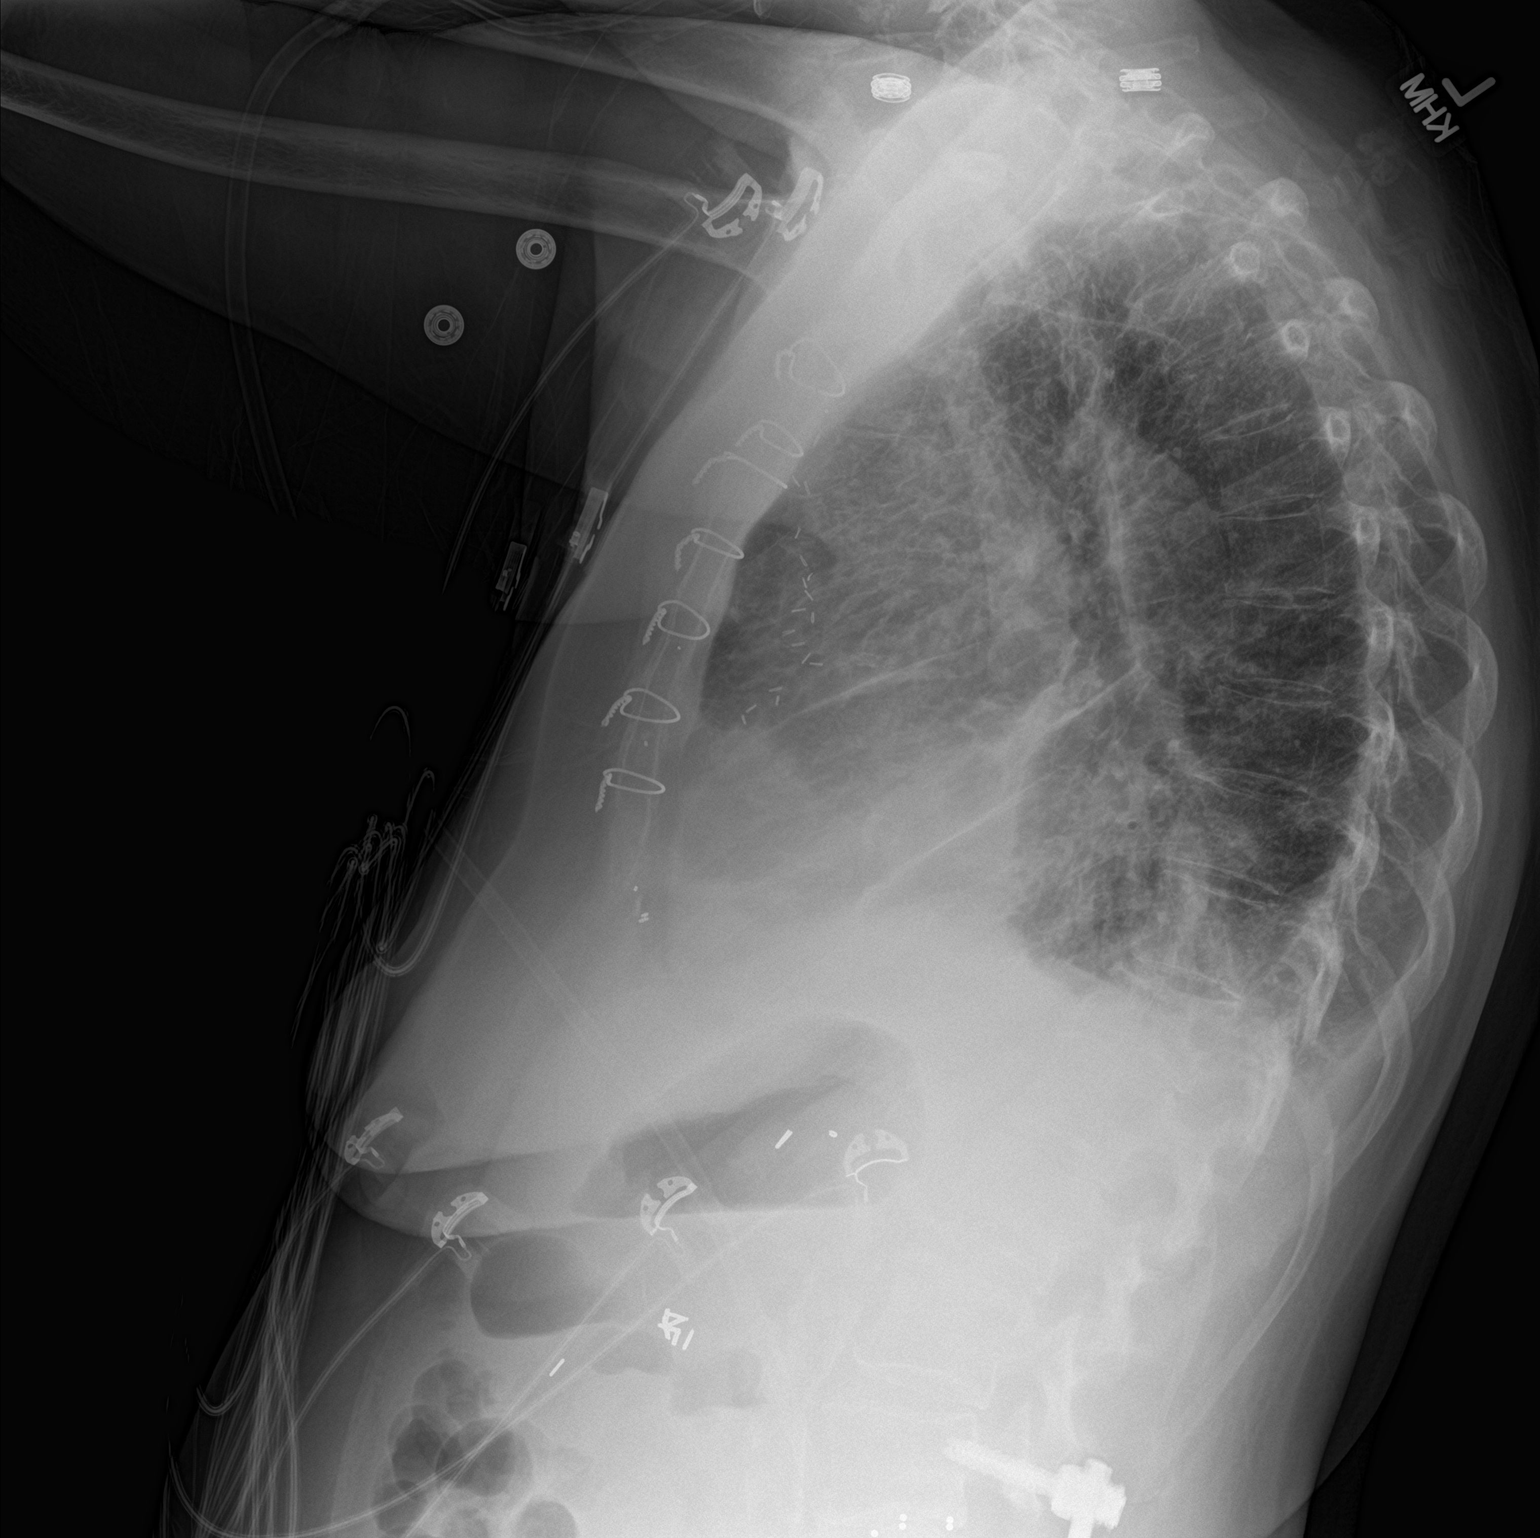

[2 of 2 positions shown; findings below may reference images not displayed]

FINDINGS: Small bilateral pleural effusions are noted. Increased interstitial
markings are seen, with underlying vascular congestion, concerning
for mild pulmonary edema. No pneumothorax is seen.

The heart is mildly enlarged. The patient is status post median
sternotomy. No acute osseous abnormalities are identified.
Postoperative change is noted at the upper abdomen, and along the
lumbar spine.
IMPRESSION: Small bilateral pleural effusions. Vascular congestion and mild
cardiomegaly, with increased interstitial markings, concerning for
mild pulmonary edema.

## 2017-05-28 ENCOUNTER — Other Ambulatory Visit: Payer: Self-pay | Admitting: Physician Assistant

## 2017-05-29 ENCOUNTER — Ambulatory Visit (INDEPENDENT_AMBULATORY_CARE_PROVIDER_SITE_OTHER): Payer: Medicare Other | Admitting: "Endocrinology

## 2017-05-29 ENCOUNTER — Encounter: Payer: Self-pay | Admitting: "Endocrinology

## 2017-05-29 VITALS — BP 111/71 | HR 69 | Ht 64.5 in | Wt 160.0 lb

## 2017-05-29 DIAGNOSIS — E0590 Thyrotoxicosis, unspecified without thyrotoxic crisis or storm: Secondary | ICD-10-CM | POA: Diagnosis not present

## 2017-05-29 DIAGNOSIS — I10 Essential (primary) hypertension: Secondary | ICD-10-CM

## 2017-05-29 DIAGNOSIS — E119 Type 2 diabetes mellitus without complications: Secondary | ICD-10-CM | POA: Diagnosis not present

## 2017-05-29 DIAGNOSIS — E782 Mixed hyperlipidemia: Secondary | ICD-10-CM | POA: Diagnosis not present

## 2017-05-29 NOTE — Progress Notes (Signed)
Subjective:    Patient ID: Katherine Larson, female    DOB: 1946/04/10, PCP Harlan Stains, MD.   Past Medical History:  Diagnosis Date  . Anemia    anemia of chronic disease +/- IDA followed by Dr. Earlie Server. received iron infusions in the past  . Asthma   . Borderline glaucoma   . CAD (coronary artery disease)    a. s/p CABG 1996. b. low risk nuc 2011. c. LHC 04/2016 due to drop in EF -> occluded native LAD,  widely patent sequential LIMA-D1-LAD.  Marland Kitchen Chronic kidney disease    "mild stage of kidney disease"  . Chronic systolic CHF (congestive heart failure) ([place])   . Complication of anesthesia   . DM2 (diabetes mellitus, type 2) ([place])    not on any medicine for this at this time, 05/2016  . GERD (gastroesophageal reflux disease)   . Gout   . History of hiatal hernia    in 59s  . History of pneumonia    March, 2016  . HLD (hyperlipidemia)   . HTN (hypertension)   . Hyperthyroidism 01/21/2016  . Inappropriate sinus tachycardia   . LBBB (left bundle branch block)    a. Seen in 04/2016  . Myocardial infarction ([place])    1996  . Neuropathy   . NICM (nonischemic cardiomyopathy) ([place])    a. Dx 04/2016 - EF 25-30%, diffuse HK, elevated LVEDP, mild MR, mod LAE.  Marland Kitchen NSVT (nonsustained ventricular tachycardia) ([place])   . PAT (paroxysmal atrial tachycardia) ([place])   . PONV (postoperative nausea and vomiting)    "after just about every surgery I've had"  . Rheumatoid arthritis (HCC)    RA  . Seasonal allergies    Past Surgical History:  Procedure Laterality Date  . ABDOMINAL HYSTERECTOMY    . APPENDECTOMY    . BACK SURGERY    . CARDIAC CATHETERIZATION N/A 05/01/2016   Procedure: Right/Left Heart Cath and Coronary/Graft Angiography;  Surgeon: Leonie Man, MD;  Location: [facility];  Service: Cardiovascular;  Laterality: N/A;  . CHOLECYSTECTOMY    . COLONOSCOPY  11/2011   Dr. Magod:ext/int hemorrhoids, diverticulosis sigmoid colon and distal desc colon, mid-desc colon  hyperplastic polyps.   . CORONARY ARTERY BYPASS GRAFT  1996   2 vessels  . ESOPHAGOGASTRODUODENOSCOPY  11/2011   Dr. Watt Climes: small hiatal hernia, one non-bleeding superficial gastric ulcer, medium-sized diverticulum in area of papilla  . ESOPHAGOGASTRODUODENOSCOPY N/A 12/29/2015   Dr. Gala Romney: Chronic inactive gastritis, normal esophagus status post dilation, duodenal diverticula  . EYE SURGERY Bilateral   . FRACTURE SURGERY Right    arm  . GIVENS CAPSULE STUDY  11/2012   Dr. Watt Climes: minimal gastritis, normal small bowel capsule  . HERNIA REPAIR     hiatal hernia  . MAXIMUM ACCESS (MAS)POSTERIOR LUMBAR INTERBODY FUSION (PLIF) 1 LEVEL N/A 08/14/2013   Procedure:  MAXIMUM ACCESS SURGERY(MAS) POSTERIOR LUMBAR INTERBODY FUSION LUMBAR THREE-FOUR ;  Surgeon: Eustace Moore, MD;  Location: [facility];  Service: Neurosurgery;  Laterality: N/A;   MAXIMUM ACCESS SURGERY(MAS) POSTERIOR LUMBAR INTERBODY FUSION LUMBAR THREE-FOUR   . PTCA    . SPINAL CORD STIMULATOR INSERTION N/A 06/16/2016   Procedure: LUMBAR SPINAL CORD STIMULATOR INSERTION;  Surgeon: Clydell Hakim, MD;  Location: [facility];  Service: Neurosurgery;  Laterality: N/A;  LUMBAR SPINAL CORD STIMULATOR INSERTION  . tens  05/2016  . TONSILLECTOMY     Social History   Social History  . Marital status: Married  Spouse name: N/A  . Number of children: 3  . Years of education: N/A   Social History Main Topics  . Smoking status: Former Smoker    Years: 15.00    Quit date: 11/20/1994  . Smokeless tobacco: Never Used     Comment: Quit in 1996  . Alcohol use No  . Drug use: No  . Sexual activity: Not Asked   Other Topics Concern  . None   Social History Narrative   2 living children, one deceased age 62      Outpatient Encounter Prescriptions as of 05/29/2017  Medication Sig  . acetaminophen (TYLENOL) 325 MG tablet Take 325 mg by mouth every 6 (six) hours as needed for mild pain.   Marland Kitchen albuterol (PROVENTIL HFA;VENTOLIN HFA) 108 (90 Base)  MCG/ACT inhaler Take 2 puffs 3 times a day and every 4 hours as needed. (Patient taking differently: Inhale into the lungs. Take 2 puffs 3 times a day and every 4 hours as needed.)  . allopurinol (ZYLOPRIM) 300 MG tablet Take 300 mg by mouth daily.  Marland Kitchen amitriptyline (ELAVIL) 10 MG tablet Take 20 mg by mouth at bedtime.   Marland Kitchen aspirin EC 81 MG EC tablet Take 1 tablet (81 mg total) by mouth daily. (Patient taking differently: Take 81 mg by mouth every morning. )  . atorvastatin (LIPITOR) 80 MG tablet Take 80 mg by mouth daily.  . carvedilol (COREG) 12.5 MG tablet Take 1 tablet (12.5 mg total) by mouth 2 (two) times daily.  . carvedilol (COREG) 25 MG tablet TAKE 1 TABLET BY MOUTH TWICE DAILY  . docusate sodium (COLACE) 100 MG capsule Take 1 capsule (100 mg total) by mouth every 12 (twelve) hours.  . fexofenadine (ALLEGRA) 180 MG tablet Take 180 mg by mouth daily.    . fluticasone (FLONASE) 50 MCG/ACT nasal spray Place 2 sprays into both nostrils daily.  . furosemide (LASIX) 20 MG tablet Take 20 mg by mouth 2 (two) times daily.  Marland Kitchen HYDROcodone-acetaminophen (NORCO) 7.5-325 MG tablet Take 1 tablet by mouth every 4 (four) hours as needed for moderate pain.  . hydroxychloroquine (PLAQUENIL) 200 MG tablet Take 400 mg by mouth daily.  . iron polysaccharides (NIFEREX) 150 MG capsule Take 150 mg by mouth daily.  Marland Kitchen leflunomide (ARAVA) 20 MG tablet Take 20 mg by mouth daily.  Marland Kitchen lisinopril (PRINIVIL,ZESTRIL) 5 MG tablet Take 1 tablet (5 mg total) by mouth daily.  Marland Kitchen MAGNESIUM-OXIDE 400 (241.3 Mg) MG tablet TAKE 1 TABLET BY MOUTH DAILY  . metoCLOPramide (REGLAN) 5 MG tablet Take 10 mg by mouth at bedtime.   . montelukast (SINGULAIR) 10 MG tablet Take 10 mg by mouth at bedtime.  . Multiple Vitamins-Minerals (MACULAR VITAMIN BENEFIT PO) Take 2 tablets by mouth daily.  . nitroGLYCERIN (NITROSTAT) 0.4 MG SL tablet Place 0.4 mg under the tongue every 5 (five) minutes as needed for chest pain. Reported on 03/24/2016  .  Omega-3 Fatty Acids (FISH OIL) 1000 MG CAPS Take 1,000 mg by mouth 3 (three) times daily with meals. Reported on 03/24/2016  . omeprazole (PRILOSEC) 20 MG capsule TAKE 1 CAPSULE(20 MG) BY MOUTH TWICE DAILY BEFORE A MEAL  . Polyethyl Glycol-Propyl Glycol (SYSTANE OP) Apply 1 drop to eye 3 (three) times daily as needed (Dry Eyes).  . polyethylene glycol (MIRALAX) packet Take 17 g by mouth daily. (Patient taking differently: Take 17 g by mouth daily as needed for mild constipation. )  . potassium chloride SA (K-DUR,KLOR-CON) 20 MEQ tablet Take 1  tablet (20 mEq total) by mouth 2 (two) times daily. (Patient taking differently: Take 20 mEq by mouth 2 (two) times daily as needed (when taking Lasix). )  . sucralfate (CARAFATE) 1 g tablet TAKE 1 TABLET BY MOUTH FOUR TIMES DAILY. CRUSH PILLS AND ADD TO A SMALL AMOUNT OF WATER OR APPLESAUSCE  . triamcinolone cream (KENALOG) 0.1 % Apply 1 application topically daily as needed (for irritation).   . [DISCONTINUED] omeprazole (PRILOSEC) 20 MG capsule Take 20 mg by mouth 2 (two) times daily before a meal. Reported on 03/24/2016   No facility-administered encounter medications on file as of 05/29/2017.    ALLERGIES:  VACCINATION STATUS:  There is no immunization history on file for this patient.  HPI  Katherine Larson is a 71 y.o. female with hx of ventricular tachycardia, type 2 diabetes, coronary artery disease s/p CABG, hypertension, hyperlipidemia, asthma. - She is being seen in follow-up for recent history of hyperthyroidism status post therapy with methimazole. - She continued to feel better, reversed the weight that she lost. - She has no new complaints today. She does have family history of thyroid problem in her mother. The patient denies personal history of goiter.   Review of Systems Constitutional: - steady weight gain But still below her routine weight of 190 pounds, -  fatigue, - subjective hyperthermia Eyes: no blurry vision, no  xerophthalmia ENT: no sore throat, no nodules palpated in throat, no dysphagia/odynophagia, no hoarseness Cardiovascular: no CP, +SOB, +alpitations Respiratory: +cough, +SOB Gastrointestinal: no N/V/D/C Musculoskeletal: no muscle/joint aches Skin: no rashes Neurological: + tremors Psychiatric: no depression/anxiety Objective:    BP 111/71   Pulse 69   Ht 5' 4.5" (1.638 m)   Wt 160 lb (72.6 kg)   BMI 27.04 kg/m   Wt Readings from Last 3 Encounters:  05/29/17 160 lb (72.6 kg)  01/15/17 151 lb (68.5 kg)  11/28/16 148 lb (67.1 kg)    Physical Exam   Constitutional:  in NAD Eyes: PERRLA, EOMI, no exophthalmos ENT: moist mucous membranes, no thyromegaly, no cervical lymphadenopathy Cardiovascular: RRR, No MRG Respiratory: CTA B Gastrointestinal: abdomen soft, NT, ND, BS+ Musculoskeletal: no deformities, strength intact in all 4 Skin: moist, warm, no rashes Neurological:  Mild tremor with outstretched hands Which is improving from last encounter, DTR are brisk in all Extremities.  Complete Blood Count (Most recent): Lab Results  Component Value Date   WBC 6.0 06/09/2016   HGB 11.1 (L) 06/09/2016   HCT 35.9 (L) 06/09/2016   MCV 96.8 06/09/2016   PLT 184 06/09/2016   Chemistry (most recent): Lab Results  Component Value Date   NA 137 05/24/2017   K 4.4 05/24/2017   CL 101 05/24/2017   CO2 26 05/24/2017   BUN 17 05/24/2017   CREATININE 0.82 05/24/2017   Diabetic Labs (most recent): Lab Results  Component Value Date   HGBA1C 5.6 05/24/2017   HGBA1C 5.5 11/22/2016   HGBA1C 5.8 (H) 01/19/2016   Lipid Panel     Component Value Date/Time   CHOL 112 12/19/2007 0910   TRIG 177 (H) 12/19/2007 0910   HDL 30.2 (L) 12/19/2007 0910   CHOLHDL 3.7 CALC 12/19/2007 0910   VLDL 35 12/19/2007 0910   LDLCALC 46 12/19/2007 0910   Results for Katherine Larson, Katherine Larson (MRN [id number]) as of 05/29/2017 11:11  Ref. Range 07/26/2016 11:34 11/22/2016 11:34 05/24/2017 10:15  TSH Latest  Units: mIU/L 8.55 (H) 0.69 0.56  T4,Free(Direct) Latest Ref Range: 0.8 - 1.8 ng/dL 0.7 (L)  1.0 1.0    Thyroid uptake and scan at 15% uniform  Assessment & Plan:   1. Hyperthyroidism  - She did have adequate response to therapy with methimazole. Her labs are now showing normal thyroid function test.  - I will proceed without medication at this time. She will need repeat thyroid function test in 12 months with office visit.  2. Type 2 diabetes mellitus without complication, without long-term current use of insulin (HCC)  -Controlled without medications. -  Her labs shows that her A1c is 5.6%.  3. Essential hypertension -  on target, I advised her to be consistent in her medications.  4. Hyperlipidemia -She is advised to remain on atorvastatin 80 mg by mouth daily at bedtime.  - I advised patient to maintain close follow up with Harlan Stains, MD for primary care needs.  Follow up plan: Return in about 1 year (around 05/29/2018) for follow up with pre-visit labs.  Glade Lloyd, MD Phone: [phone]  Fax: [phone]   05/29/2017, 11:20 AM

## 2017-06-14 ENCOUNTER — Ambulatory Visit (INDEPENDENT_AMBULATORY_CARE_PROVIDER_SITE_OTHER): Payer: Medicare Other | Admitting: Internal Medicine

## 2017-06-14 ENCOUNTER — Encounter: Payer: Self-pay | Admitting: Internal Medicine

## 2017-06-14 VITALS — BP 122/68 | HR 88 | Ht 64.5 in | Wt 161.0 lb

## 2017-06-14 DIAGNOSIS — I4892 Unspecified atrial flutter: Secondary | ICD-10-CM | POA: Diagnosis not present

## 2017-06-14 NOTE — Patient Instructions (Signed)
Medication Instructions:  Your physician recommends that you continue on your current medications as directed. Please refer to the Current Medication list given to you today.   Labwork: NONE  Testing/Procedures: NONE  Follow-Up: Your physician recommends that you schedule a follow-up appointment with Dr. [name] as Needed.  Your physician recommends that you schedule a follow-up appointment with Dr. [name] Cancel in 6 Months   Any Other Special Instructions Will Be Listed Below (If Applicable).     If you need a refill on your cardiac medications before your next appointment, please call your pharmacy.

## 2017-06-14 NOTE — Progress Notes (Signed)
HPI Katherine Larson returns today for evaluation of SVT and CHF. She is a pleasant 71 yo woman with an ICM, with patent LIMA to LAD, who had been referred to me to evaluate her for possible ICD. She had an echo which demonstrated an EF of 35%. She was initially not on maximal medical therapy but has been started on lisinopril. She feels well. No edema. No syncope and no chest pain. Her repeat EF is 45-50%. Her heart failure symptoms are class 2A. She has had a spinal stimulator in the interim and feels well.    Current Outpatient Prescriptions  Medication Sig Dispense Refill  . acetaminophen (TYLENOL) 325 MG tablet Take 325 mg by mouth every 6 (six) hours as needed for mild pain.     Marland Kitchen albuterol (PROVENTIL HFA;VENTOLIN HFA) 108 (90 Base) MCG/ACT inhaler Take 2 puffs 3 times a day and every 4 hours as needed. (Patient taking differently: Inhale into the lungs. Take 2 puffs 3 times a day and every 4 hours as needed.)    . allopurinol (ZYLOPRIM) 300 MG tablet Take 300 mg by mouth daily.    Marland Kitchen amitriptyline (ELAVIL) 10 MG tablet Take 20 mg by mouth at bedtime.     Marland Kitchen aspirin EC 81 MG EC tablet Take 1 tablet (81 mg total) by mouth daily. (Patient taking differently: Take 81 mg by mouth every morning. )    . atorvastatin (LIPITOR) 80 MG tablet Take 80 mg by mouth daily.    . carvedilol (COREG) 12.5 MG tablet Take 1 tablet (12.5 mg total) by mouth 2 (two) times daily. 180 tablet 3  . carvedilol (COREG) 25 MG tablet TAKE 1 TABLET BY MOUTH TWICE DAILY 180 tablet 1  . docusate sodium (COLACE) 100 MG capsule Take 1 capsule (100 mg total) by mouth every 12 (twelve) hours. 60 capsule 1  . fexofenadine (ALLEGRA) 180 MG tablet Take 180 mg by mouth daily.      . fluticasone (FLONASE) 50 MCG/ACT nasal spray Place 2 sprays into both nostrils daily.    . furosemide (LASIX) 20 MG tablet Take 20 mg by mouth 2 (two) times daily.    Marland Kitchen HYDROcodone-acetaminophen (NORCO) 7.5-325 MG tablet Take 1 tablet by mouth every 4 (four)  hours as needed for moderate pain. 50 tablet 0  . hydroxychloroquine (PLAQUENIL) 200 MG tablet Take 400 mg by mouth daily.    . iron polysaccharides (NIFEREX) 150 MG capsule Take 150 mg by mouth daily.    Marland Kitchen leflunomide (ARAVA) 20 MG tablet Take 20 mg by mouth daily.    Marland Kitchen lisinopril (PRINIVIL,ZESTRIL) 5 MG tablet Take 1 tablet (5 mg total) by mouth daily. 90 tablet 3  . MAGNESIUM-OXIDE 400 (241.3 Mg) MG tablet TAKE 1 TABLET BY MOUTH DAILY 30 tablet 0  . metoCLOPramide (REGLAN) 5 MG tablet Take 10 mg by mouth at bedtime.     . montelukast (SINGULAIR) 10 MG tablet Take 10 mg by mouth at bedtime.    . Multiple Vitamins-Minerals (MACULAR VITAMIN BENEFIT PO) Take 2 tablets by mouth daily.    . nitroGLYCERIN (NITROSTAT) 0.4 MG SL tablet Place 0.4 mg under the tongue every 5 (five) minutes as needed for chest pain. Reported on 03/24/2016    . Omega-3 Fatty Acids (FISH OIL) 1000 MG CAPS Take 1,000 mg by mouth 3 (three) times daily with meals. Reported on 03/24/2016    . omeprazole (PRILOSEC) 20 MG capsule TAKE 1 CAPSULE(20 MG) BY MOUTH TWICE DAILY BEFORE A MEAL 60 capsule 5  .  Polyethyl Glycol-Propyl Glycol (SYSTANE OP) Apply 1 drop to eye 3 (three) times daily as needed (Dry Eyes).    . polyethylene glycol (MIRALAX) packet Take 17 g by mouth daily. (Patient taking differently: Take 17 g by mouth daily as needed for mild constipation. ) 60 each 1  . potassium chloride SA (K-DUR,KLOR-CON) 20 MEQ tablet Take 1 tablet (20 mEq total) by mouth 2 (two) times daily. (Patient taking differently: Take 20 mEq by mouth 2 (two) times daily as needed (when taking Lasix). ) 40 tablet 11  . sucralfate (CARAFATE) 1 g tablet TAKE 1 TABLET BY MOUTH FOUR TIMES DAILY. CRUSH PILLS AND ADD TO A SMALL AMOUNT OF WATER OR APPLESAUSCE 120 tablet 0  . triamcinolone cream (KENALOG) 0.1 % Apply 1 application topically daily as needed (for irritation).      No current facility-administered medications for this visit.      Past Medical  History:  Diagnosis Date  . Anemia    anemia of chronic disease +/- IDA followed by Dr. Earlie Server. received iron infusions in the past  . Asthma   . Borderline glaucoma   . CAD (coronary artery disease)    a. s/p CABG 1996. b. low risk nuc 2011. c. LHC 04/2016 due to drop in EF -> occluded native LAD,  widely patent sequential LIMA-D1-LAD.  Marland Kitchen Chronic kidney disease    "mild stage of kidney disease"  . Chronic systolic CHF (congestive heart failure) ([place])   . Complication of anesthesia   . DM2 (diabetes mellitus, type 2) ([place])    not on any medicine for this at this time, 05/2016  . GERD (gastroesophageal reflux disease)   . Gout   . History of hiatal hernia    in 69s  . History of pneumonia    March, 2016  . HLD (hyperlipidemia)   . HTN (hypertension)   . Hyperthyroidism 01/21/2016  . Inappropriate sinus tachycardia   . LBBB (left bundle branch block)    a. Seen in 04/2016  . Myocardial infarction ([place])    1996  . Neuropathy   . NICM (nonischemic cardiomyopathy) ([place])    a. Dx 04/2016 - EF 25-30%, diffuse HK, elevated LVEDP, mild MR, mod LAE.  Marland Kitchen NSVT (nonsustained ventricular tachycardia) ([place])   . PAT (paroxysmal atrial tachycardia) ([place])   . PONV (postoperative nausea and vomiting)    "after just about every surgery I've had"  . Rheumatoid arthritis (HCC)    RA  . Seasonal allergies     ROS:   All systems reviewed and negative except as noted in the HPI.   Past Surgical History:  Procedure Laterality Date  . ABDOMINAL HYSTERECTOMY    . APPENDECTOMY    . BACK SURGERY    . CARDIAC CATHETERIZATION N/A 05/01/2016   Procedure: Right/Left Heart Cath and Coronary/Graft Angiography;  Surgeon: Leonie Man, MD;  Location: [facility];  Service: Cardiovascular;  Laterality: N/A;  . CHOLECYSTECTOMY    . COLONOSCOPY  11/2011   Dr. Magod:ext/int hemorrhoids, diverticulosis sigmoid colon and distal desc colon, mid-desc colon hyperplastic polyps.   . CORONARY ARTERY BYPASS  GRAFT  1996   2 vessels  . ESOPHAGOGASTRODUODENOSCOPY  11/2011   Dr. Watt Climes: small hiatal hernia, one non-bleeding superficial gastric ulcer, medium-sized diverticulum in area of papilla  . ESOPHAGOGASTRODUODENOSCOPY N/A 12/29/2015   Dr. Gala Romney: Chronic inactive gastritis, normal esophagus status post dilation, duodenal diverticula  . EYE SURGERY Bilateral   . FRACTURE SURGERY Right    arm  .  GIVENS CAPSULE STUDY  11/2012   Dr. Watt Climes: minimal gastritis, normal small bowel capsule  . HERNIA REPAIR     hiatal hernia  . MAXIMUM ACCESS (MAS)POSTERIOR LUMBAR INTERBODY FUSION (PLIF) 1 LEVEL N/A 08/14/2013   Procedure:  MAXIMUM ACCESS SURGERY(MAS) POSTERIOR LUMBAR INTERBODY FUSION LUMBAR THREE-FOUR ;  Surgeon: Eustace Moore, MD;  Location: [facility];  Service: Neurosurgery;  Laterality: N/A;   MAXIMUM ACCESS SURGERY(MAS) POSTERIOR LUMBAR INTERBODY FUSION LUMBAR THREE-FOUR   . PTCA    . SPINAL CORD STIMULATOR INSERTION N/A 06/16/2016   Procedure: LUMBAR SPINAL CORD STIMULATOR INSERTION;  Surgeon: Clydell Hakim, MD;  Location: [facility];  Service: Neurosurgery;  Laterality: N/A;  LUMBAR SPINAL CORD STIMULATOR INSERTION  . tens  05/2016  . TONSILLECTOMY       Family History  Problem Relation Age of Onset  . Heart attack Father   . Heart attack Mother   . Bladder Cancer Brother   . Cervical cancer Daughter        ?  . Colon cancer Neg Hx      Social History   Social History  . Marital status: Married    Spouse name: N/A  . Number of children: 3  . Years of education: N/A   Occupational History  . Not on file.   Social History Main Topics  . Smoking status: Former Smoker    Years: 15.00    Quit date: 11/20/1994  . Smokeless tobacco: Never Used     Comment: Quit in 1996  . Alcohol use No  . Drug use: No  . Sexual activity: Yes   Other Topics Concern  . Not on file   Social History Narrative   2 living children, one deceased age 47        BP 122/68   Pulse 88   Ht 5'  4.5" (1.638 m)   Wt 161 lb (73 kg)   SpO2 98%   BMI 27.21 kg/m   Physical Exam:  Well appearing 71 yo woman, NAD HEENT: Unremarkable Neck:  no JVD, no thyromegally Back:  No CVA tenderness Lungs:  Clear with no wheezes, rales, or rhonchi. HEART:  Regular rate rhythm, no murmurs, no rubs, no clicks Abd:  soft, positive bowel sounds, no organomegally, no rebound, no guarding Ext:  2 plus pulses, no edema, no cyanosis, no clubbing Skin:  No rashes no nodules Neuro:  CN II through XII intact, motor grossly intact  ECG - reviewed - nsr with noise artifact  Assess/Plan: 1. Chronic systolic heart failure - she is well compensated and on maximal medical therapy. Her EF is 45-50%. No indication for an ICD. I have encouraged the patient to continue her current meds, maintain a low sodium diet, and start walking. 2. Atrial tachycardia - she had very little atrial tachycardia when she last wore a cardiac monitor. No additional treatment at this time. She is currently asymptomatic. 3. Arthritis - she is s/p spinal stimulator and her symptoms are improved. I encouraged her to increase her physical activity.  Mikle Bosworth.D.

## 2017-06-22 ENCOUNTER — Other Ambulatory Visit: Payer: Self-pay | Admitting: *Deleted

## 2017-06-22 ENCOUNTER — Encounter: Payer: Self-pay | Admitting: Internal Medicine

## 2017-06-22 MED ORDER — NITROGLYCERIN 0.4 MG SL SUBL: 0.4000 mg | SUBLINGUAL_TABLET | SUBLINGUAL | 99 refills | Status: DC | PRN

## 2017-06-26 ENCOUNTER — Other Ambulatory Visit: Payer: Self-pay | Admitting: Physician Assistant

## 2017-06-27 NOTE — Telephone Encounter (Signed)
This is a [facility] pt.  °

## 2017-07-25 ENCOUNTER — Other Ambulatory Visit: Payer: Self-pay | Admitting: Physician Assistant

## 2017-09-26 IMAGING — US US THYROID
1 series · 14 of 25 positions shown · non-contrast
Comparison: None.

CLINICAL DATA: Hyperthyroidism. Tachycardia, diabetes,
hypertension, anemia.

EXAM:
THYROID ULTRASOUND
TECHNIQUE: Ultrasound examination of the thyroid gland and adjacent soft
tissues was performed.

[Series 1: us thyroid · 0.06mm/px · 14 of 143 slices shown]
[im 1/143]
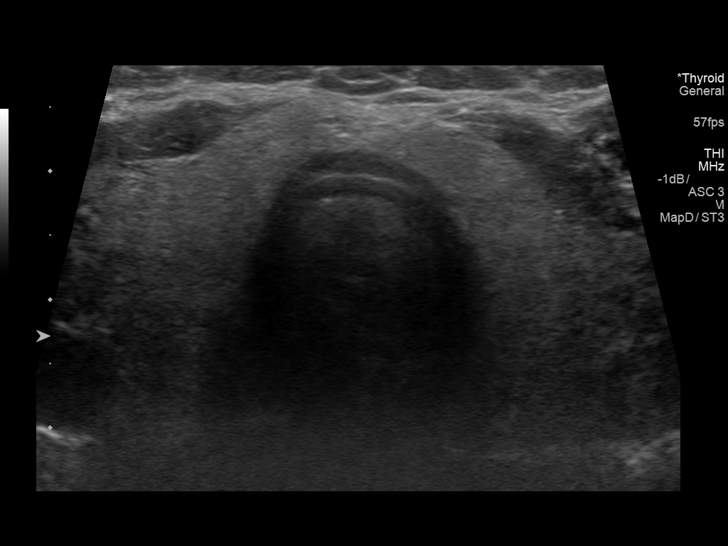
[im 12/143]
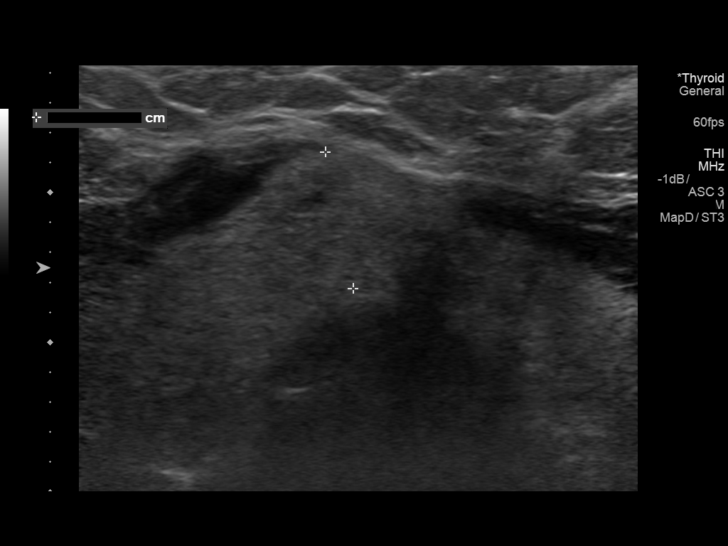
[im 24/143]
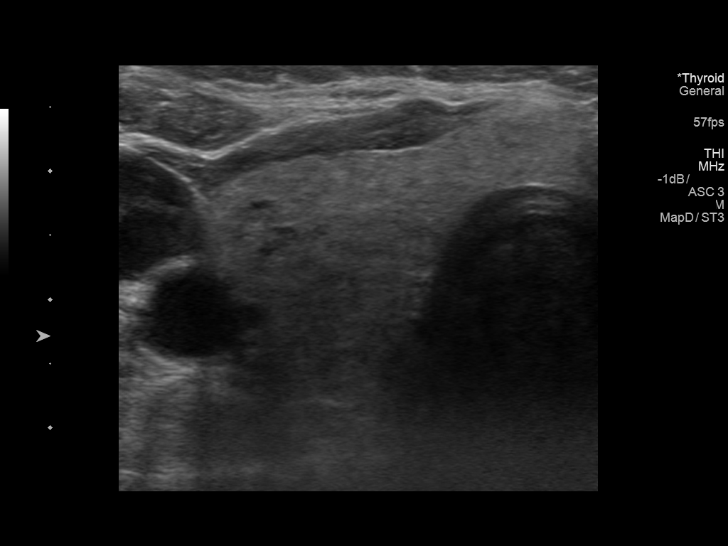
[im 36/143]
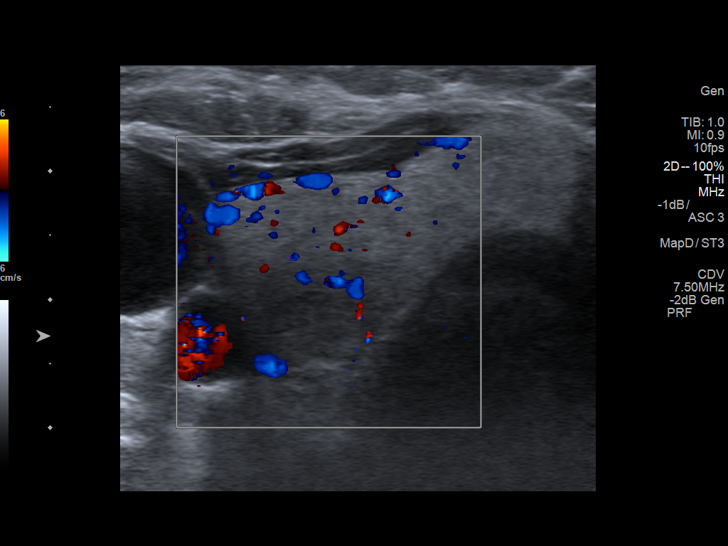
[im 48/143]
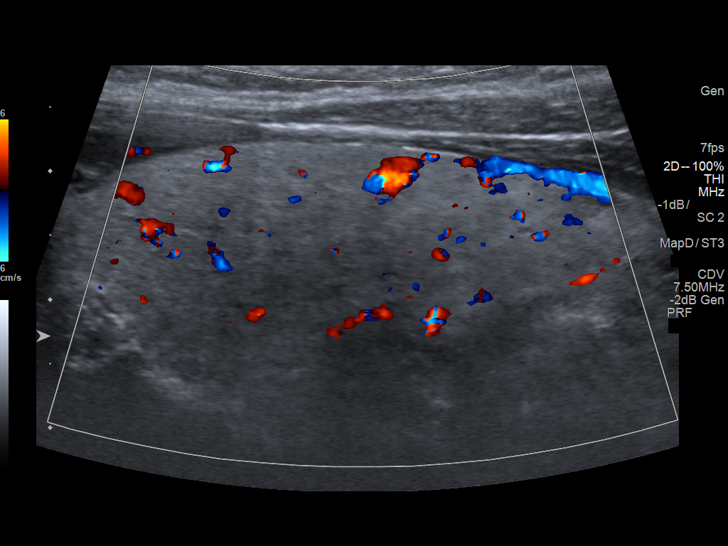
[im 54/143]
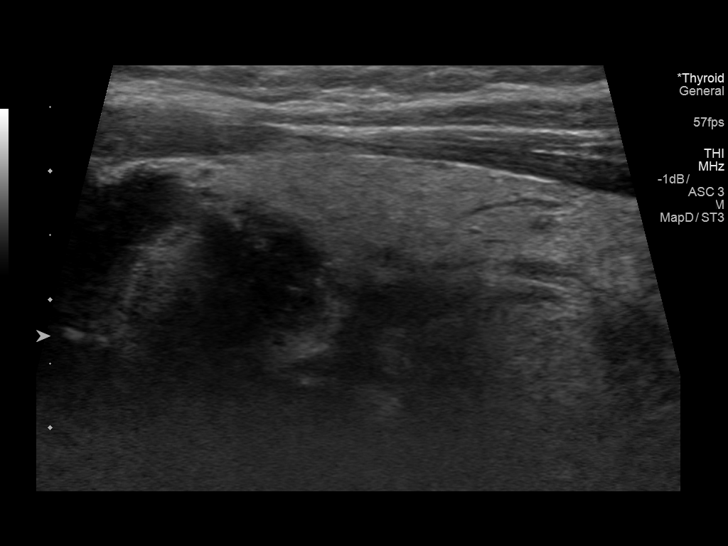
[im 66/143]
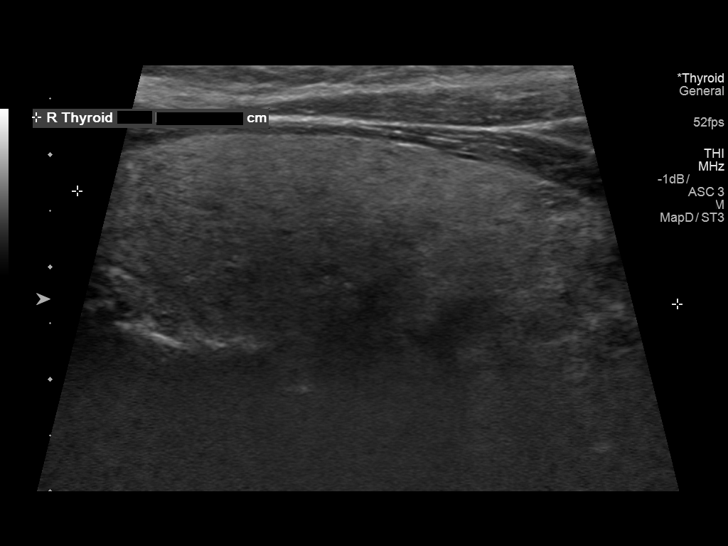
[im 77/143]
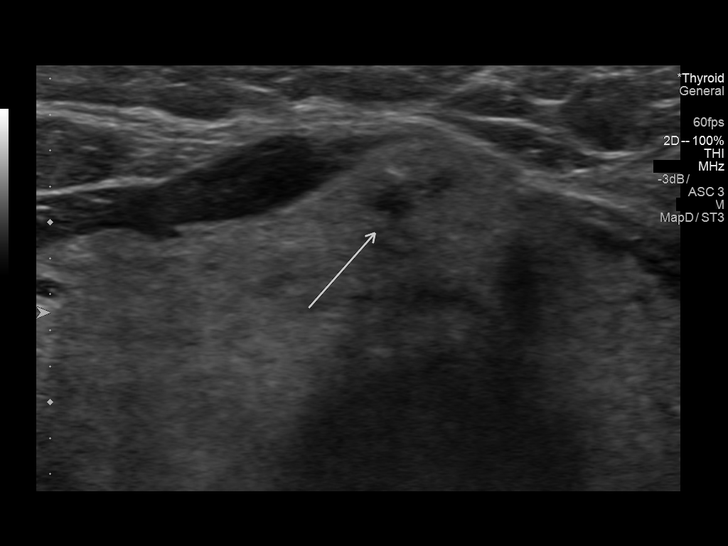
[im 89/143]
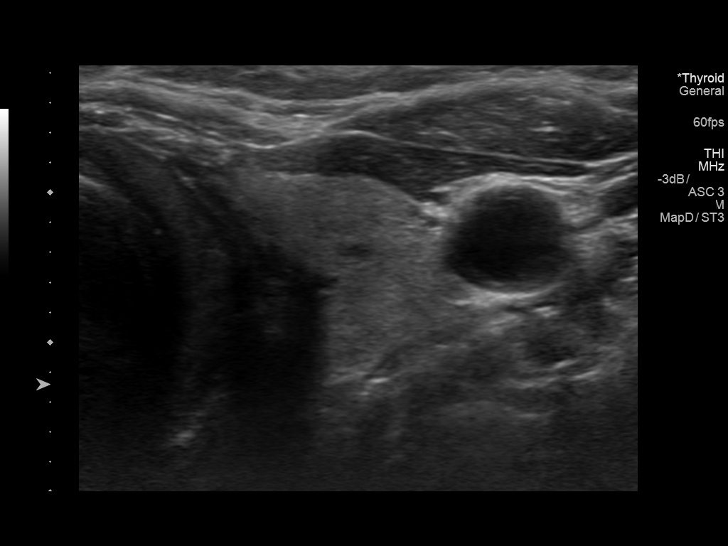
[im 95/143]
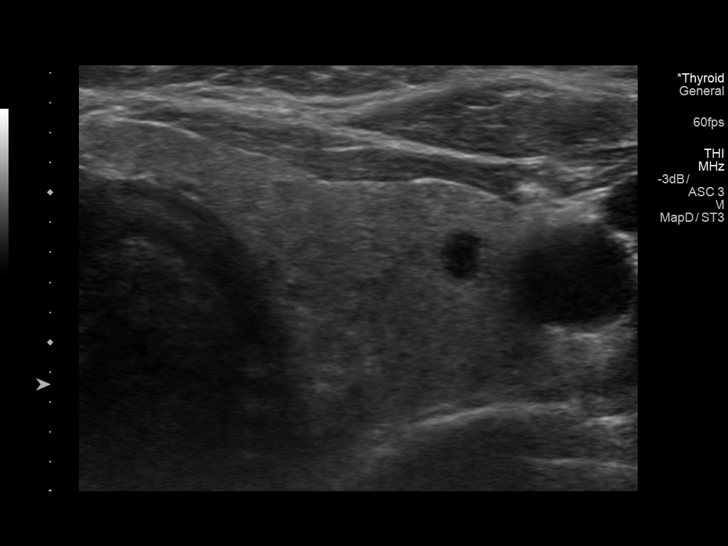
[im 107/143]
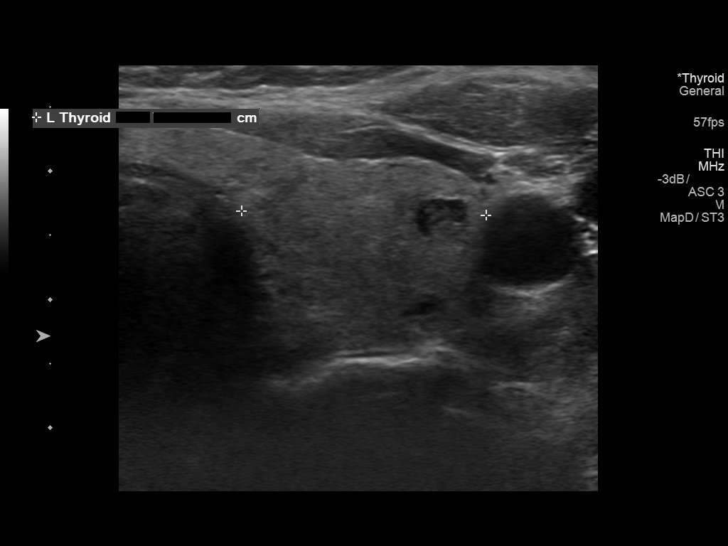
[im 119/143]
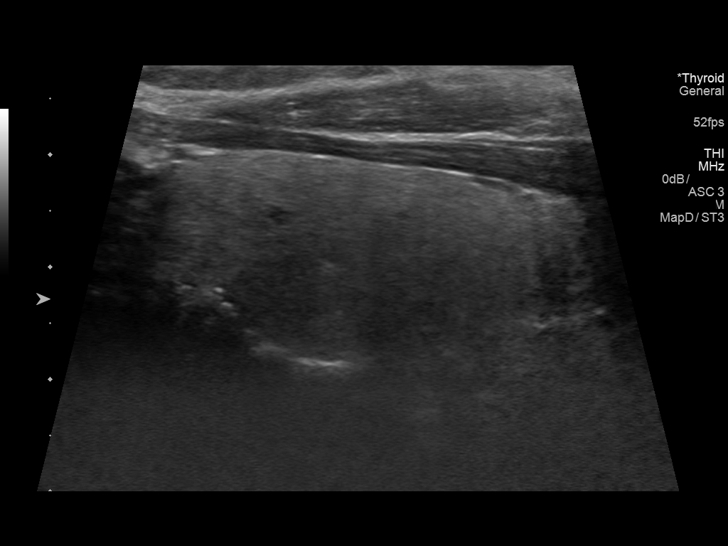
[im 131/143]
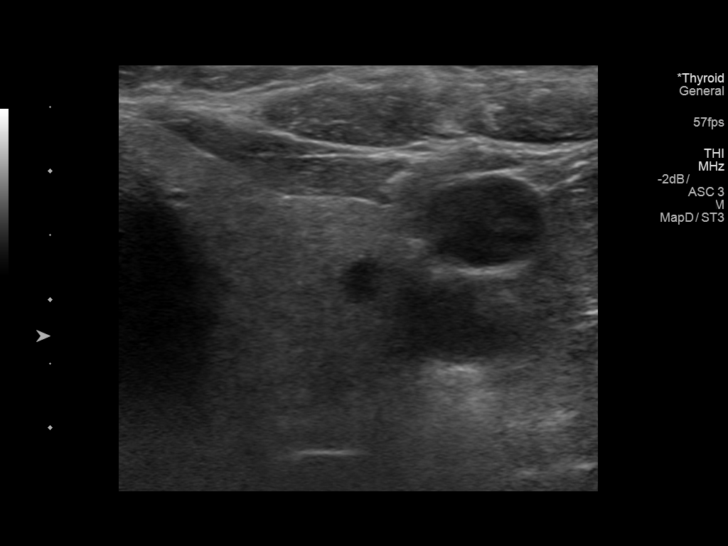
[im 143/143]
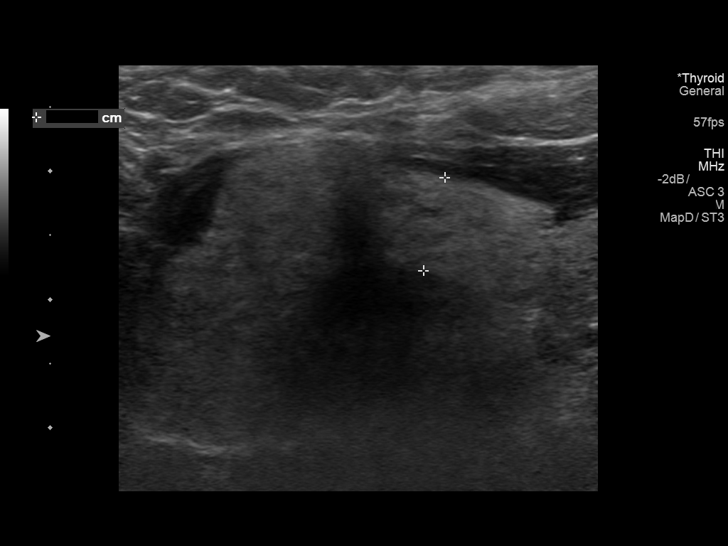

[14 of 25 positions shown; findings below may reference images not displayed]

FINDINGS: Right thyroid lobe

Measurements: 54 x 23 x 19 mm. Mildly inhomogeneous background
parenchyma. 4 x 3 x 3 mm hypoechoic nodule or cyst, inferior pole.

Left thyroid lobe

Measurements: 49 x 19 x 19 mm. 4 x 3 x 3 mm hypoechoic nodule or
cyst, mid lobe.

Isthmus

Thickness: 9 mm.  6 x 3 x 6 mm hypoechoic nodule, right of midline.

Lymphadenopathy

None visualized.
IMPRESSION: 1. Thyromegaly with small bilateral nodules or cysts. Findings do
not meet current consensus criteria for biopsy. Follow-up by
clinical exam is recommended. If patient has known risk factors for
thyroid carcinoma, consider follow-up ultrasound in 12 months. If
patient is clinically hyperthyroid, consider nuclear medicine
thyroid uptake and scan. This recommendation follows the consensus
statement: Management of Thyroid Nodules Detected as US: Society of
Radiologists in Ultrasound Consensus Conference Statement. Radiology

## 2017-12-24 ENCOUNTER — Other Ambulatory Visit: Payer: Self-pay | Admitting: Family Medicine

## 2017-12-24 DIAGNOSIS — Z139 Encounter for screening, unspecified: Secondary | ICD-10-CM

## 2018-01-12 ENCOUNTER — Other Ambulatory Visit: Payer: Self-pay | Admitting: Cardiovascular Disease

## 2018-01-28 ENCOUNTER — Ambulatory Visit
Admission: RE | Admit: 2018-01-28 | Discharge: 2018-01-28 | Disposition: A | Discharge status: Home / Self Care | Payer: Medicare Other | Source: Ambulatory Visit | Attending: Family Medicine | Admitting: Family Medicine

## 2018-01-28 DIAGNOSIS — Z139 Encounter for screening, unspecified: Secondary | ICD-10-CM

## 2018-02-02 NOTE — Progress Notes (Signed)
Cardiology Office Note    Date:  02/05/2018  ID:  TIMISHA MONDRY, DOB Oct 24, 1946, MRN [id number] PCP:  Harlan Stains, MD  Cardiologist:  Dr. [name] Cancel Linna Hoff), EP - Dr. [name]   Chief Complaint: CABG   History of Present Illness:    72 y.o. CABG with LIMA 1996 Most recent EF 45-50% NSVT, PAT, LBBB Hyperthyroidism activity limited by chronic back pain. Seen a lot by GT for evaluation AICD not needed. Tends to be tachycardic    Had syncopal spell June 2017 event monitor showed no arrhythmia Cath 05/01/16 patent LIMA normal right heart pressures No significant circumflex or RCA disease  No chest pain Husband having some hip issues Gave him Zenia Resides name  Past Medical History:  Diagnosis Date  . Anemia    anemia of chronic disease +/- IDA followed by Dr. Earlie Server. received iron infusions in the past  . Asthma   . Borderline glaucoma   . CAD (coronary artery disease)    a. s/p CABG 1996. b. low risk nuc 2011. c. LHC 04/2016 due to drop in EF -> occluded native LAD,  widely patent sequential LIMA-D1-LAD.  Marland Kitchen Chronic kidney disease    "mild stage of kidney disease"  . Chronic systolic CHF (congestive heart failure) ([place])   . Complication of anesthesia   . DM2 (diabetes mellitus, type 2) ([place])    not on any medicine for this at this time, 05/2016  . GERD (gastroesophageal reflux disease)   . Gout   . History of hiatal hernia    in 76s  . History of pneumonia    March, 2016  . HLD (hyperlipidemia)   . HTN (hypertension)   . Hyperthyroidism 01/21/2016  . Inappropriate sinus tachycardia   . LBBB (left bundle branch block)    a. Seen in 04/2016  . Myocardial infarction ([place])    1996  . Neuropathy   . NICM (nonischemic cardiomyopathy) ([place])    a. Dx 04/2016 - EF 25-30%, diffuse HK, elevated LVEDP, mild MR, mod LAE.  Marland Kitchen NSVT (nonsustained ventricular tachycardia) ([place])   . PAT (paroxysmal atrial tachycardia) ([place])   . PONV (postoperative nausea and vomiting)    "after just  about every surgery I've had"  . Rheumatoid arthritis (HCC)    RA  . Seasonal allergies     Past Surgical History:  Procedure Laterality Date  . ABDOMINAL HYSTERECTOMY    . APPENDECTOMY    . BACK SURGERY    . CARDIAC CATHETERIZATION N/A 05/01/2016   Procedure: Right/Left Heart Cath and Coronary/Graft Angiography;  Surgeon: Leonie Man, MD;  Location: [facility];  Service: Cardiovascular;  Laterality: N/A;  . CHOLECYSTECTOMY    . COLONOSCOPY  11/2011   Dr. Magod:ext/int hemorrhoids, diverticulosis sigmoid colon and distal desc colon, mid-desc colon hyperplastic polyps.   . CORONARY ARTERY BYPASS GRAFT  1996   2 vessels  . ESOPHAGOGASTRODUODENOSCOPY  11/2011   Dr. Watt Climes: small hiatal hernia, one non-bleeding superficial gastric ulcer, medium-sized diverticulum in area of papilla  . ESOPHAGOGASTRODUODENOSCOPY N/A 12/29/2015   Dr. Gala Romney: Chronic inactive gastritis, normal esophagus status post dilation, duodenal diverticula  . EYE SURGERY Bilateral   . FRACTURE SURGERY Right    arm  . GIVENS CAPSULE STUDY  11/2012   Dr. Watt Climes: minimal gastritis, normal small bowel capsule  . HERNIA REPAIR     hiatal hernia  . MAXIMUM ACCESS (MAS)POSTERIOR LUMBAR INTERBODY FUSION (PLIF) 1 LEVEL N/A 08/14/2013   Procedure:  MAXIMUM ACCESS SURGERY(MAS)  POSTERIOR LUMBAR INTERBODY FUSION LUMBAR THREE-FOUR ;  Surgeon: Eustace Moore, MD;  Location: [facility];  Service: Neurosurgery;  Laterality: N/A;   MAXIMUM ACCESS SURGERY(MAS) POSTERIOR LUMBAR INTERBODY FUSION LUMBAR THREE-FOUR   . PTCA    . SPINAL CORD STIMULATOR INSERTION N/A 06/16/2016   Procedure: LUMBAR SPINAL CORD STIMULATOR INSERTION;  Surgeon: Clydell Hakim, MD;  Location: [facility];  Service: Neurosurgery;  Laterality: N/A;  LUMBAR SPINAL CORD STIMULATOR INSERTION  . tens  05/2016  . TONSILLECTOMY      Current Medications: Outpatient Medications Prior to Visit  Medication Sig Dispense Refill  . acetaminophen (TYLENOL) 325 MG tablet  Take 325 mg by mouth every 6 (six) hours as needed for mild pain.     Marland Kitchen albuterol (PROVENTIL HFA;VENTOLIN HFA) 108 (90 Base) MCG/ACT inhaler Take 2 puffs 3 times a day and every 4 hours as needed. (Patient taking differently: Inhale into the lungs. Take 2 puffs 3 times a day and every 4 hours as needed.)    . allopurinol (ZYLOPRIM) 300 MG tablet Take 300 mg by mouth daily.    Marland Kitchen amitriptyline (ELAVIL) 10 MG tablet Take 20 mg by mouth at bedtime.     Marland Kitchen aspirin EC 81 MG EC tablet Take 1 tablet (81 mg total) by mouth daily. (Patient taking differently: Take 81 mg by mouth every morning. )    . atorvastatin (LIPITOR) 80 MG tablet Take 80 mg by mouth daily.    . carvedilol (COREG) 12.5 MG tablet Take 1 tablet (12.5 mg total) by mouth 2 (two) times daily. 180 tablet 3  . fexofenadine (ALLEGRA) 180 MG tablet Take 180 mg by mouth daily.      . fluticasone (FLONASE) 50 MCG/ACT nasal spray Place 2 sprays into both nostrils daily.    . furosemide (LASIX) 20 MG tablet Take 20 mg by mouth 2 (two) times daily.    Marland Kitchen HYDROcodone-acetaminophen (NORCO) 7.5-325 MG tablet Take 1 tablet by mouth every 4 (four) hours as needed for moderate pain. 50 tablet 0  . hydroxychloroquine (PLAQUENIL) 200 MG tablet Take 400 mg by mouth daily.    . iron polysaccharides (NIFEREX) 150 MG capsule Take 150 mg by mouth daily.    Marland Kitchen lisinopril (PRINIVIL,ZESTRIL) 5 MG tablet Take 1 tablet (5 mg total) by mouth daily. 90 tablet 3  . metoCLOPramide (REGLAN) 5 MG tablet Take 10 mg by mouth at bedtime.     . montelukast (SINGULAIR) 10 MG tablet Take 10 mg by mouth at bedtime.    . Multiple Vitamins-Minerals (MACULAR VITAMIN BENEFIT PO) Take 2 tablets by mouth daily.    . nitroGLYCERIN (NITROSTAT) 0.4 MG SL tablet Place 1 tablet (0.4 mg total) under the tongue every 5 (five) minutes as needed for chest pain. Reported on 03/24/2016 25 tablet prn  . Omega-3 Fatty Acids (FISH OIL) 1000 MG CAPS Take 1,000 mg by mouth 3 (three) times daily with meals.  Reported on 03/24/2016    . omeprazole (PRILOSEC) 20 MG capsule TAKE 1 CAPSULE(20 MG) BY MOUTH TWICE DAILY BEFORE A MEAL 60 capsule 5  . Polyethyl Glycol-Propyl Glycol (SYSTANE OP) Apply 1 drop to eye 3 (three) times daily as needed (Dry Eyes).    . polyethylene glycol (MIRALAX) packet Take 17 g by mouth daily. (Patient taking differently: Take 17 g by mouth daily as needed for mild constipation. ) 60 each 1  . potassium chloride SA (K-DUR,KLOR-CON) 20 MEQ tablet Take 1 tablet (20 mEq total) by mouth 2 (two)  times daily. (Patient taking differently: Take 20 mEq by mouth 2 (two) times daily as needed (when taking Lasix). ) 40 tablet 11  . triamcinolone cream (KENALOG) 0.1 % Apply 1 application topically daily as needed (for irritation).     . carvedilol (COREG) 25 MG tablet TAKE 1 TABLET BY MOUTH TWICE DAILY 180 tablet 1  . docusate sodium (COLACE) 100 MG capsule Take 1 capsule (100 mg total) by mouth every 12 (twelve) hours. 60 capsule 1  . leflunomide (ARAVA) 20 MG tablet Take 20 mg by mouth daily.    Marland Kitchen MAGNESIUM-OXIDE 400 (241.3 Mg) MG tablet TAKE 1 TABLET BY MOUTH EVERY DAY 30 tablet 6  . sucralfate (CARAFATE) 1 g tablet TAKE 1 TABLET BY MOUTH FOUR TIMES DAILY. CRUSH PILLS AND ADD TO A SMALL AMOUNT OF WATER OR APPLESAUSCE 120 tablet 0   No facility-administered medications prior to visit.      Allergies:   Biaxin [clarithromycin] and Penicillins   Social History   Socioeconomic History  . Marital status: Married    Spouse name: None  . Number of children: 3  . Years of education: None  . Highest education level: None  Social Needs  . Financial resource strain: None  . Food insecurity - worry: None  . Food insecurity - inability: None  . Transportation needs - medical: None  . Transportation needs - non-medical: None  Occupational History  . None  Tobacco Use  . Smoking status: Former Smoker    Years: 15.00    Last attempt to quit: 11/20/1994    Years since quitting: 23.2  .  Smokeless tobacco: Never Used  . Tobacco comment: Quit in 1996  Substance and Sexual Activity  . Alcohol use: No    Alcohol/week: 0.0 oz  . Drug use: No  . Sexual activity: Yes  Other Topics Concern  . None  Social History Narrative   2 living children, one deceased age 3     Family History:  The patient's family history includes Bladder Cancer in her brother; Cervical cancer in her daughter; Heart attack in her father and mother.   ROS:   Please see the history of present illness.  All other systems are reviewed and otherwise negative.    PHYSICAL EXAM:   VS:  BP 116/66   Pulse 93   Ht 5\' 2"  (1.575 m)   Wt 167 lb (75.8 kg)   SpO2 94%   BMI 30.54 kg/m   BMI: Body mass index is 30.54 kg/m. Affect appropriate Healthy:  appears stated age 40: normal Neck supple with no adenopathy JVP normal no bruits no thyromegaly Lungs clear with no wheezing and good diaphragmatic motion Heart:  S1/S2 no murmur, no rub, gallop or click PMI normal post sternotomy  Abdomen: benighn, BS positve, no tenderness, no AAA no bruit.  No HSM or HJR Distal pulses intact with no bruits No edema Neuro non-focal Skin warm and dry No muscular weakness   Wt Readings from Last 3 Encounters:  02/05/18 167 lb (75.8 kg)  06/14/17 161 lb (73 kg)  05/29/17 160 lb (72.6 kg)      Studies/Labs Reviewed:   EKG:  EKG was ordered today and personally reviewed by me and demonstratesNSR 100bpm, QTc 463ms, QRs 17ms, nonspecific ST-T changes  Recent Labs: 05/24/2017: ALT 13; BUN 17; Creat 0.82; Potassium 4.4; Sodium 137; TSH 0.56   Lipid Panel    Component Value Date/Time   CHOL 112 12/19/2007 0910   TRIG 177 (H)  12/19/2007 0910   HDL 30.2 (L) 12/19/2007 0910   CHOLHDL 3.7 CALC 12/19/2007 0910   VLDL 35 12/19/2007 0910   LDLCALC 46 12/19/2007 0910    Additional studies/ records that were reviewed today include: Summarized above.    ASSESSMENT & PLAN:   1. CHF: echo 01/19/17 EF 45-50%  stable euvolemic  Continue medical Rx  2. Paroxysmal atrial tach - resolved medical Rx  3. CAD - continue ASA, BB, statin for now. Cath June 2017  as above.patent LIMA 4. NSVT - continue beta blocker Seen by GT given improved EF on medical Rx no need for AICD  5. HTN:  Well controlled.  Continue current medications and low sodium Dash type diet.   6. DM:  Discussed low carb diet.  Target hemoglobin A1c is 6.5 or less.  Continue current medications. 7. Chol:  F/u labs with primary continue statin     Jenkins Rouge

## 2018-02-05 ENCOUNTER — Encounter: Payer: Self-pay | Admitting: Cardiovascular Disease

## 2018-02-05 ENCOUNTER — Ambulatory Visit: Payer: Medicare Other | Admitting: Cardiovascular Disease

## 2018-02-05 VITALS — BP 116/66 | HR 93 | Ht 62.0 in | Wt 167.0 lb

## 2018-02-05 DIAGNOSIS — I2510 Atherosclerotic heart disease of native coronary artery without angina pectoris: Secondary | ICD-10-CM

## 2018-02-05 NOTE — Patient Instructions (Signed)

## 2018-02-14 ENCOUNTER — Ambulatory Visit: Payer: Medicare Other | Admitting: Podiatry

## 2018-02-14 ENCOUNTER — Encounter: Payer: Self-pay | Admitting: Podiatry

## 2018-02-14 DIAGNOSIS — E1151 Type 2 diabetes mellitus with diabetic peripheral angiopathy without gangrene: Secondary | ICD-10-CM

## 2018-02-14 DIAGNOSIS — L600 Ingrowing nail: Secondary | ICD-10-CM | POA: Diagnosis not present

## 2018-02-14 DIAGNOSIS — M79676 Pain in unspecified toe(s): Secondary | ICD-10-CM

## 2018-02-14 DIAGNOSIS — M0630 Rheumatoid nodule, unspecified site: Secondary | ICD-10-CM

## 2018-02-14 DIAGNOSIS — E119 Type 2 diabetes mellitus without complications: Secondary | ICD-10-CM

## 2018-02-14 DIAGNOSIS — B351 Tinea unguium: Secondary | ICD-10-CM

## 2018-02-14 NOTE — Progress Notes (Signed)
Subjective:  Patient ID: Katherine Larson, female    DOB: Apr 05, 1946,  MRN: [id number]  Chief Complaint  Patient presents with  . Nail Problem    right great toenail thick and ingrowing - very painful  . Foot Pain    left foot swelling under great toe   72 y.o. female presents with the above complaint.  History of diabetes mellitus controlled with diet.  Also reports history of rheumatoid.  Complains of swelling underneath the great toe of the left foot.  Unsure of etiology.  Reports thick ingrowing nails to the right great toe.  Difficult caring for themselves.  Denies nausea vomiting fever chills  Past Medical History:  Diagnosis Date  . Anemia    anemia of chronic disease +/- IDA followed by Dr. Earlie Server. received iron infusions in the past  . Asthma   . Borderline glaucoma   . CAD (coronary artery disease)    a. s/p CABG 1996. b. low risk nuc 2011. c. LHC 04/2016 due to drop in EF -> occluded native LAD,  widely patent sequential LIMA-D1-LAD.  Marland Kitchen Chronic kidney disease    "mild stage of kidney disease"  . Chronic systolic CHF (congestive heart failure) ([place])   . Complication of anesthesia   . DM2 (diabetes mellitus, type 2) ([place])    not on any medicine for this at this time, 05/2016  . GERD (gastroesophageal reflux disease)   . Gout   . History of hiatal hernia    in 13s  . History of pneumonia    March, 2016  . HLD (hyperlipidemia)   . HTN (hypertension)   . Hyperthyroidism 01/21/2016  . Inappropriate sinus tachycardia   . LBBB (left bundle branch block)    a. Seen in 04/2016  . Myocardial infarction ([place])    1996  . Neuropathy   . NICM (nonischemic cardiomyopathy) ([place])    a. Dx 04/2016 - EF 25-30%, diffuse HK, elevated LVEDP, mild MR, mod LAE.  Marland Kitchen NSVT (nonsustained ventricular tachycardia) ([place])   . PAT (paroxysmal atrial tachycardia) ([place])   . PONV (postoperative nausea and vomiting)    "after just about every surgery I've had"  . Rheumatoid arthritis (HCC)    RA  .  Seasonal allergies    Past Surgical History:  Procedure Laterality Date  . ABDOMINAL HYSTERECTOMY    . APPENDECTOMY    . BACK SURGERY    . CARDIAC CATHETERIZATION N/A 05/01/2016   Procedure: Right/Left Heart Cath and Coronary/Graft Angiography;  Surgeon: Leonie Man, MD;  Location: [facility];  Service: Cardiovascular;  Laterality: N/A;  . CHOLECYSTECTOMY    . COLONOSCOPY  11/2011   Dr. Magod:ext/int hemorrhoids, diverticulosis sigmoid colon and distal desc colon, mid-desc colon hyperplastic polyps.   . CORONARY ARTERY BYPASS GRAFT  1996   2 vessels  . ESOPHAGOGASTRODUODENOSCOPY  11/2011   Dr. Watt Climes: small hiatal hernia, one non-bleeding superficial gastric ulcer, medium-sized diverticulum in area of papilla  . ESOPHAGOGASTRODUODENOSCOPY N/A 12/29/2015   Dr. Gala Romney: Chronic inactive gastritis, normal esophagus status post dilation, duodenal diverticula  . EYE SURGERY Bilateral   . FRACTURE SURGERY Right    arm  . GIVENS CAPSULE STUDY  11/2012   Dr. Watt Climes: minimal gastritis, normal small bowel capsule  . HERNIA REPAIR     hiatal hernia  . MAXIMUM ACCESS (MAS)POSTERIOR LUMBAR INTERBODY FUSION (PLIF) 1 LEVEL N/A 08/14/2013   Procedure:  MAXIMUM ACCESS SURGERY(MAS) POSTERIOR LUMBAR INTERBODY FUSION LUMBAR THREE-FOUR ;  Surgeon: Eustace Moore,  MD;  Location: [facility];  Service: Neurosurgery;  Laterality: N/A;   MAXIMUM ACCESS SURGERY(MAS) POSTERIOR LUMBAR INTERBODY FUSION LUMBAR THREE-FOUR   . PTCA    . SPINAL CORD STIMULATOR INSERTION N/A 06/16/2016   Procedure: LUMBAR SPINAL CORD STIMULATOR INSERTION;  Surgeon: Clydell Hakim, MD;  Location: [facility];  Service: Neurosurgery;  Laterality: N/A;  LUMBAR SPINAL CORD STIMULATOR INSERTION  . tens  05/2016  . TONSILLECTOMY      Current Outpatient Medications:  .  acetaminophen (TYLENOL) 325 MG tablet, Take 325 mg by mouth every 6 (six) hours as needed for mild pain. , Disp: , Rfl:  .  albuterol (PROVENTIL HFA;VENTOLIN HFA) 108 (90  Base) MCG/ACT inhaler, Take 2 puffs 3 times a day and every 4 hours as needed. (Patient taking differently: Inhale into the lungs. Take 2 puffs 3 times a day and every 4 hours as needed.), Disp: , Rfl:  .  allopurinol (ZYLOPRIM) 300 MG tablet, Take 300 mg by mouth daily., Disp: , Rfl:  .  amitriptyline (ELAVIL) 10 MG tablet, Take 20 mg by mouth at bedtime. , Disp: , Rfl:  .  aspirin EC 81 MG EC tablet, Take 1 tablet (81 mg total) by mouth daily. (Patient taking differently: Take 81 mg by mouth every morning. ), Disp: , Rfl:  .  atorvastatin (LIPITOR) 80 MG tablet, Take 80 mg by mouth daily., Disp: , Rfl:  .  carvedilol (COREG) 12.5 MG tablet, Take 1 tablet (12.5 mg total) by mouth 2 (two) times daily., Disp: 180 tablet, Rfl: 3 .  fexofenadine (ALLEGRA) 180 MG tablet, Take 180 mg by mouth daily.  , Disp: , Rfl:  .  fluticasone (FLONASE) 50 MCG/ACT nasal spray, Place 2 sprays into both nostrils daily., Disp: , Rfl:  .  furosemide (LASIX) 20 MG tablet, Take 20 mg by mouth 2 (two) times daily., Disp: , Rfl:  .  HYDROcodone-acetaminophen (NORCO) 7.5-325 MG tablet, Take 1 tablet by mouth every 4 (four) hours as needed for moderate pain., Disp: 50 tablet, Rfl: 0 .  hydroxychloroquine (PLAQUENIL) 200 MG tablet, Take 400 mg by mouth daily., Disp: , Rfl:  .  iron polysaccharides (NIFEREX) 150 MG capsule, Take 150 mg by mouth daily., Disp: , Rfl:  .  lisinopril (PRINIVIL,ZESTRIL) 5 MG tablet, Take 1 tablet (5 mg total) by mouth daily., Disp: 90 tablet, Rfl: 3 .  metoCLOPramide (REGLAN) 5 MG tablet, Take 10 mg by mouth at bedtime. , Disp: , Rfl:  .  montelukast (SINGULAIR) 10 MG tablet, Take 10 mg by mouth at bedtime., Disp: , Rfl:  .  Multiple Vitamins-Minerals (MACULAR VITAMIN BENEFIT PO), Take 2 tablets by mouth daily., Disp: , Rfl:  .  nitroGLYCERIN (NITROSTAT) 0.4 MG SL tablet, Place 1 tablet (0.4 mg total) under the tongue every 5 (five) minutes as needed for chest pain. Reported on 03/24/2016, Disp: 25  tablet, Rfl: prn .  Omega-3 Fatty Acids (FISH OIL) 1000 MG CAPS, Take 1,000 mg by mouth 3 (three) times daily with meals. Reported on 03/24/2016, Disp: , Rfl:  .  omeprazole (PRILOSEC) 20 MG capsule, TAKE 1 CAPSULE(20 MG) BY MOUTH TWICE DAILY BEFORE A MEAL, Disp: 60 capsule, Rfl: 5 .  Polyethyl Glycol-Propyl Glycol (SYSTANE OP), Apply 1 drop to eye 3 (three) times daily as needed (Dry Eyes)., Disp: , Rfl:  .  polyethylene glycol (MIRALAX) packet, Take 17 g by mouth daily. (Patient taking differently: Take 17 g by mouth daily as needed for mild constipation. ),  Disp: 60 each, Rfl: 1 .  potassium chloride SA (K-DUR,KLOR-CON) 20 MEQ tablet, Take 1 tablet (20 mEq total) by mouth 2 (two) times daily. (Patient taking differently: Take 20 mEq by mouth 2 (two) times daily as needed (when taking Lasix). ), Disp: 40 tablet, Rfl: 11 .  triamcinolone cream (KENALOG) 0.1 %, Apply 1 application topically daily as needed (for irritation). , Disp: , Rfl:   Allergies  Allergen Reactions  . Biaxin [Clarithromycin] Rash and Other (See Comments)    Blisters in mouth    Review of Systems Objective:   General AA&O x3. Normal mood and affect.  Vascular Dorsalis pedis pulses present 1+ bilaterally  Posterior tibial pulses absent bilaterally  Capillary refill normal to all digits. Pedal hair growth normal.  Neurologic Epicritic sensation present bilaterally. Protective sensation with 5.07 monofilament  present bilaterally. Vibratory sensation present bilaterally.  Dermatologic No open lesions. Interspaces clear of maceration.  Normal skin temperature and turgor. Hyperkeratotic lesions: None bilaterally. Nails: brittle, onychomycosis, thickening, elongation  Orthopedic: No history of amputation. MMT 5/5 in dorsiflexion, plantarflexion, inversion, and eversion. Normal lower extremity joint ROM without pain or crepitus. Firm nodule under L great toe.   Assessment & Plan:  Patient was evaluated and treated and  all questions answered.  Diabetes with PAD, Onychomycosis -Educated on diabetic footcare. Diabetic risk level 1 -Nails x10 debrided sharply and manually with large nail nipper and rotary burr.  Procedure: Nail Debridement Rationale: Patient meets criteria for routine foot care due to Class B findings Type of Debridement: manual, sharp debridement. Instrumentation: Nail nipper, rotary burr. Number of Nails: 10  Rheumatoid nodule left great toe -Discussed conservative versus surgical therapy.  Would hold off on surgical correction due to likely recurrence.  Patient understands and agrees to proceed in this manner.  Return in about 3 months (around 05/17/2018) for Diabetic Foot Care.

## 2018-02-23 ENCOUNTER — Other Ambulatory Visit: Payer: Self-pay | Admitting: Internal Medicine

## 2018-02-25 NOTE — Telephone Encounter (Signed)
This is a [place] pt.  °

## 2018-02-26 ENCOUNTER — Other Ambulatory Visit: Payer: Medicare Other

## 2018-04-02 ENCOUNTER — Ambulatory Visit (INDEPENDENT_AMBULATORY_CARE_PROVIDER_SITE_OTHER): Payer: Medicare Other | Admitting: Orthotics

## 2018-04-02 DIAGNOSIS — M0630 Rheumatoid nodule, unspecified site: Secondary | ICD-10-CM

## 2018-04-02 DIAGNOSIS — E1151 Type 2 diabetes mellitus with diabetic peripheral angiopathy without gangrene: Secondary | ICD-10-CM

## 2018-04-02 DIAGNOSIS — E119 Type 2 diabetes mellitus without complications: Secondary | ICD-10-CM

## 2018-04-03 NOTE — Progress Notes (Signed)

## 2018-05-08 ENCOUNTER — Other Ambulatory Visit: Payer: Self-pay | Admitting: Internal Medicine

## 2018-05-16 ENCOUNTER — Ambulatory Visit: Payer: Medicare Other | Admitting: Podiatry

## 2018-05-30 ENCOUNTER — Ambulatory Visit: Payer: Medicare Other | Admitting: Podiatry

## 2018-05-30 ENCOUNTER — Encounter: Payer: Self-pay | Admitting: Podiatry

## 2018-05-30 DIAGNOSIS — M0630 Rheumatoid nodule, unspecified site: Secondary | ICD-10-CM

## 2018-06-01 ENCOUNTER — Telehealth: Payer: Self-pay | Admitting: Student

## 2018-06-01 NOTE — Telephone Encounter (Addendum)
    Received a call on the after-hours line from [facility] with [facility] in regards to the patient having gained 5 lbs overnight and experiencing worsening swelling and edema. Attempted to call the patient multiple times but unable to reach on home phone. LVM on her cell-phone with return number to call.   Signed, Erma Heritage, PA-C 06/01/2018, 11:29 AM Pager: [phone]   Called the patient later and was successful in reaching her. She reports she missed both doses of Lasix yesterday and consumed a large amount of tea. She already took her Lasix tablet this morning and has urinated multiple times. Reports improvement in her edema. Says breathing has been at baseline. Denies any other symptoms. Recommended she continue on her usual dose of Lasix 20mg  BID. May take an additional 20mg  tomorrow morning if weight has not returned to baseline. She voiced understanding of this and was appreciative of the call.   Signed, Erma Heritage, PA-C 06/01/2018, 11:39 AM Pager: [phone]

## 2018-06-11 NOTE — Progress Notes (Addendum)
  Subjective:  Patient ID: Katherine Larson, female    DOB: Sep 03, 1946,  MRN: [id number]  Chief Complaint  Patient presents with  . Debridement    bilateral nail trim; pt stated, "want to talk about orthotics, feet still hurt wearing them, especially under big toe area left foot where i have arthritis"   72 y.o. female returns for the above complaint.  Having pain under the left big toe area  Objective:   General AA&O x3. Normal mood and affect.  Vascular Dorsalis pedis pulses present 1+ bilaterally  Posterior tibial pulses absent bilaterally  Capillary refill normal to all digits. Pedal hair growth normal.  Neurologic Epicritic sensation present bilaterally. Protective sensation with 5.07 monofilament  present bilaterally. Vibratory sensation present bilaterally.  Dermatologic No open lesions. Interspaces clear of maceration.  Normal skin temperature and turgor. Hyperkeratotic lesions: None bilaterally. Nails: brittle, onychomycosis, thickening  Orthopedic: No history of amputation. MMT 5/5 in dorsiflexion, plantarflexion, inversion, and eversion. Normal lower extremity joint ROM without pain or crepitus. Firm nodule under L great toe.   Assessment & Plan:  Patient was evaluated and treated and all questions answered.  Rheumatoid Nodule -Injection to rheumatoid nodule as below -Should pain persist would consider possible removal  Procedure: Injection Tendon/Ligament Consent: Verbal consent obtained. Location: Rheumatoid nodule left great toe Skin Prep: Alcohol. Injectate: 1 cc 0.5% marcaine plain, 1 cc dexamethasone phosphate, 0.5 cc kenalog 10. Disposition: Patient tolerated procedure well. Injection site dressed with a band-aid.     Return in about 6 weeks (around 07/11/2018) for Rheumatoid nodule f/u.

## 2018-07-12 ENCOUNTER — Ambulatory Visit: Payer: Medicare Other | Admitting: Podiatry

## 2018-08-12 ENCOUNTER — Other Ambulatory Visit: Payer: Self-pay | Admitting: Cardiovascular Disease

## 2018-08-13 ENCOUNTER — Other Ambulatory Visit: Payer: Self-pay | Admitting: Cardiovascular Disease

## 2018-08-15 ENCOUNTER — Encounter: Payer: Self-pay | Admitting: Podiatry

## 2018-08-15 ENCOUNTER — Ambulatory Visit: Payer: Medicare Other | Admitting: Podiatry

## 2018-08-15 DIAGNOSIS — L600 Ingrowing nail: Secondary | ICD-10-CM

## 2018-08-15 DIAGNOSIS — M0630 Rheumatoid nodule, unspecified site: Secondary | ICD-10-CM | POA: Diagnosis not present

## 2018-08-15 MED ORDER — NEOMYCIN-POLYMYXIN-HC 3.5-10000-1 OT SOLN: OTIC | 0 refills | Status: DC

## 2018-08-15 NOTE — Patient Instructions (Signed)
Soak Instructions    THE DAY AFTER THE PROCEDURE  Place 1/4 cup of epsom salts in a quart of warm tap water.  Submerge your foot or feet with outer bandage intact for the initial soak; this will allow the bandage to become moist and wet for easy lift off.  Once you remove your bandage, continue to soak in the solution for 20 minutes.  This soak should be done twice a day.  Next, remove your foot or feet from solution, blot dry the affected area and cover.  You may use a band aid large enough to cover the area or use gauze and tape.  Apply other medications to the area as directed by the doctor such as polysporin neosporin.  IF YOUR SKIN BECOMES IRRITATED WHILE USING THESE INSTRUCTIONS, IT IS OKAY TO SWITCH TO  WHITE VINEGAR AND WATER. Or you may use antibacterial soap and water to keep the toe clean  Monitor for any signs/symptoms of infection. Call the office immediately if any occur or go directly to the emergency room. Call with any questions/concerns.    Long Term Care Instructions-Post Nail Surgery  You have had your ingrown toenail and root treated with a chemical.  This chemical causes a burn that will drain and ooze like a blister.  This can drain for 6-8 weeks or longer.  It is important to keep this area clean, covered, and follow the soaking instructions dispensed at the time of your surgery.  This area will eventually dry and form a scab.  Once the scab forms you no longer need to soak or apply a dressing.  If at any time you experience an increase in pain, redness, swelling, or drainage, you should contact the office as soon as possible. Long Term Care Instructions-Post Nail Surgery  You have had your ingrown toenail and root treated with a chemical.  This chemical causes a burn that will drain and ooze like a blister.  This can drain for 6-8 weeks or longer.  It is important to keep this area clean, covered, and follow the soaking instructions dispensed at the time of your surgery.   This area will eventually dry and form a scab.  Once the scab forms you no longer need to soak or apply a dressing.  If at any time you experience an increase in pain, redness, swelling, or drainage, you should contact the office as soon as possible. 

## 2018-08-15 NOTE — Progress Notes (Signed)
Subjective:  Patient ID: Katherine Larson, female    DOB: 1946-08-08,  MRN: [id number]  Chief Complaint  Patient presents with  . Rheumatoid Nodule    follow up; pt stated, "still having pain, no new concerns"  . Nail Problem    right foot great toe lateral side; pt stated, "think i have an ingrown"    72 y.o. female presents with the above complaint.  Still having pain at the nodule.  States that the injection did help for several weeks.  Reports new complaint of right great toenail ingrown nail of outside aspect of the right nail.  Endorses redness and pain.  Denies drainage.  Review of Systems: Negative except as noted in the HPI. Denies N/V/F/Ch.  Past Medical History:  Diagnosis Date  . Anemia    anemia of chronic disease +/- IDA followed by Dr. Earlie Server. received iron infusions in the past  . Asthma   . Borderline glaucoma   . CAD (coronary artery disease)    a. s/p CABG 1996. b. low risk nuc 2011. c. LHC 04/2016 due to drop in EF -> occluded native LAD,  widely patent sequential LIMA-D1-LAD.  Marland Kitchen Chronic kidney disease    "mild stage of kidney disease"  . Chronic systolic CHF (congestive heart failure) ([place])   . Complication of anesthesia   . DM2 (diabetes mellitus, type 2) ([place])    not on any medicine for this at this time, 05/2016  . GERD (gastroesophageal reflux disease)   . Gout   . History of hiatal hernia    in 109s  . History of pneumonia    March, 2016  . HLD (hyperlipidemia)   . HTN (hypertension)   . Hyperthyroidism 01/21/2016  . Inappropriate sinus tachycardia   . LBBB (left bundle branch block)    a. Seen in 04/2016  . Myocardial infarction ([place])    1996  . Neuropathy   . NICM (nonischemic cardiomyopathy) ([place])    a. Dx 04/2016 - EF 25-30%, diffuse HK, elevated LVEDP, mild MR, mod LAE.  Marland Kitchen NSVT (nonsustained ventricular tachycardia) ([place])   . PAT (paroxysmal atrial tachycardia) ([place])   . PONV (postoperative nausea and vomiting)    "after just about every  surgery I've had"  . Rheumatoid arthritis ([facility])    RA  . Seasonal allergies     Current Outpatient Medications:  .  acetaminophen (TYLENOL) 325 MG tablet, Take 325 mg by mouth every 6 (six) hours as needed for mild pain. , Disp: , Rfl:  .  albuterol (PROVENTIL HFA;VENTOLIN HFA) 108 (90 Base) MCG/ACT inhaler, Take 2 puffs 3 times a day and every 4 hours as needed. (Patient taking differently: Inhale into the lungs. Take 2 puffs 3 times a day and every 4 hours as needed.), Disp: , Rfl:  .  allopurinol (ZYLOPRIM) 300 MG tablet, Take 300 mg by mouth daily., Disp: , Rfl:  .  amitriptyline (ELAVIL) 10 MG tablet, Take 20 mg by mouth at bedtime. , Disp: , Rfl:  .  aspirin EC 81 MG EC tablet, Take 1 tablet (81 mg total) by mouth daily. (Patient taking differently: Take 81 mg by mouth every morning. ), Disp: , Rfl:  .  atorvastatin (LIPITOR) 80 MG tablet, Take 80 mg by mouth daily., Disp: , Rfl:  .  carvedilol (COREG) 12.5 MG tablet, TAKE 1 TABLET BY MOUTH TWO  TIMES DAILY, Disp: 180 tablet, Rfl: 2 .  fexofenadine (ALLEGRA) 180 MG tablet, Take 180 mg by mouth daily.  ,  Disp: , Rfl:  .  fluticasone (FLONASE) 50 MCG/ACT nasal spray, Place 2 sprays into both nostrils daily., Disp: , Rfl:  .  furosemide (LASIX) 20 MG tablet, Take 20 mg by mouth 2 (two) times daily., Disp: , Rfl:  .  gabapentin (NEURONTIN) 300 MG capsule, , Disp: , Rfl:  .  HYDROcodone-acetaminophen (NORCO) 7.5-325 MG tablet, Take 1 tablet by mouth every 4 (four) hours as needed for moderate pain., Disp: 50 tablet, Rfl: 0 .  hydroxychloroquine (PLAQUENIL) 200 MG tablet, Take 400 mg by mouth daily., Disp: , Rfl:  .  iron polysaccharides (NIFEREX) 150 MG capsule, Take 150 mg by mouth daily., Disp: , Rfl:  .  lisinopril (PRINIVIL,ZESTRIL) 5 MG tablet, TAKE 1 TABLET BY MOUTH  DAILY, Disp: 90 tablet, Rfl: 3 .  metoCLOPramide (REGLAN) 5 MG tablet, Take 10 mg by mouth at bedtime. , Disp: , Rfl:  .  montelukast (SINGULAIR) 10 MG tablet, Take 10 mg  by mouth at bedtime., Disp: , Rfl:  .  Multiple Vitamins-Minerals (MACULAR VITAMIN BENEFIT PO), Take 2 tablets by mouth daily., Disp: , Rfl:  .  nitroGLYCERIN (NITROSTAT) 0.4 MG SL tablet, Place 1 tablet (0.4 mg total) under the tongue every 5 (five) minutes as needed for chest pain. Reported on 03/24/2016, Disp: 25 tablet, Rfl: prn .  Omega-3 Fatty Acids (FISH OIL) 1000 MG CAPS, Take 1,000 mg by mouth 3 (three) times daily with meals. Reported on 03/24/2016, Disp: , Rfl:  .  omeprazole (PRILOSEC) 20 MG capsule, TAKE 1 CAPSULE(20 MG) BY MOUTH TWICE DAILY BEFORE A MEAL, Disp: 60 capsule, Rfl: 5 .  Polyethyl Glycol-Propyl Glycol (SYSTANE OP), Apply 1 drop to eye 3 (three) times daily as needed (Dry Eyes)., Disp: , Rfl:  .  polyethylene glycol (MIRALAX) packet, Take 17 g by mouth daily. (Patient taking differently: Take 17 g by mouth daily as needed for mild constipation. ), Disp: 60 each, Rfl: 1 .  potassium chloride SA (K-DUR,KLOR-CON) 20 MEQ tablet, Take 1 tablet (20 mEq total) by mouth 2 (two) times daily. (Patient taking differently: Take 20 mEq by mouth 2 (two) times daily as needed (when taking Lasix). ), Disp: 40 tablet, Rfl: 11 .  triamcinolone cream (KENALOG) 0.1 %, Apply 1 application topically daily as needed (for irritation). , Disp: , Rfl:  .  neomycin-polymyxin-hydrocortisone (CORTISPORIN) OTIC solution, Apply 2 drops to the ingrown toenail site twice daily. Cover with band-aid., Disp: 10 mL, Rfl: 0  Social History   Tobacco Use  Smoking Status Former Smoker  . Years: 15.00  . Last attempt to quit: 11/20/1994  . Years since quitting: 23.7  Smokeless Tobacco Never Used  Tobacco Comment   Quit in 1996    Allergies  Allergen Reactions  . Biaxin [Clarithromycin] Rash and Other (See Comments)    Blisters in mouth   . Penicillins Rash and Other (See Comments)    Blisters in mouth   Objective:  There were no vitals filed for this visit. There is no height or weight on file to  calculate BMI. Constitutional Well developed. Well nourished.  Vascular Dorsalis pedis pulses palpable bilaterally. Posterior tibial pulses palpable bilaterally. Capillary refill normal to all digits.  No cyanosis or clubbing noted. Pedal hair growth normal.  Neurologic Normal speech. Oriented to person, place, and time. Epicritic sensation to light touch grossly present bilaterally.  Dermatologic Painful ingrowing nail at lateral nail borders of the hallux nail right. No other open wounds. No skin lesions.  Orthopedic: Normal joint  ROM without pain or crepitus bilaterally. No visible deformities. No bony tenderness.   Radiographs: None Assessment:   1. Ingrown nail   2. Rheumatoid nodule ([place])    Plan:  Patient was evaluated and treated and all questions answered.  Ingrown Nail, right -Patient elects to proceed with minor surgery to remove ingrown toenail removal today. Consent reviewed and signed by patient. -Ingrown nail excised. See procedure note. -Educated on post-procedure care including soaking. Written instructions provided and reviewed. -Patient to follow up in 2 weeks for nail check.  Procedure: Excision of Ingrown Toenail Location: Right 1st toe lateral nail borders. Anesthesia: Lidocaine 1% plain; 1.5 mL and Marcaine 0.5% plain; 1.5 mL, digital block. Skin Prep: Betadine. Dressing: Silvadene; telfa; dry, sterile, compression dressing. Technique: Following skin prep, the toe was exsanguinated and a tourniquet was secured at the base of the toe. The affected nail border was freed, split with a nail splitter, and excised. Chemical matrixectomy was then performed with phenol and irrigated out with alcohol. The tourniquet was then removed and sterile dressing applied. Disposition: Patient tolerated procedure well. Patient to return in 2 weeks for follow-up.   Left first MPJ rheumatoid nodule -Repeat injection as per below -Should pain persist will consider surgical  excision.  Procedure: Fibroma injection Location: Left first MPJ Skin Prep: Alcohol. Injectate: 0.5 cc 1% lidocaine plain, 0.5 cc dexamethasone phosphate. Disposition: Patient tolerated procedure well. Injection site dressed with a band-aid.  Return in about 2 weeks (around 08/29/2018) for nail check R , f/u L rheumatoid nodule.

## 2018-08-17 ENCOUNTER — Other Ambulatory Visit: Payer: Self-pay | Admitting: Cardiovascular Disease

## 2018-08-30 ENCOUNTER — Ambulatory Visit: Payer: Medicare Other | Admitting: Podiatry

## 2018-09-12 ENCOUNTER — Encounter: Payer: Self-pay | Admitting: Podiatry

## 2018-09-12 ENCOUNTER — Ambulatory Visit: Payer: Medicare Other | Admitting: Podiatry

## 2018-09-12 DIAGNOSIS — M7989 Other specified soft tissue disorders: Secondary | ICD-10-CM | POA: Diagnosis not present

## 2018-09-12 DIAGNOSIS — M0630 Rheumatoid nodule, unspecified site: Secondary | ICD-10-CM | POA: Diagnosis not present

## 2018-09-19 ENCOUNTER — Telehealth: Payer: Self-pay | Admitting: *Deleted

## 2018-09-19 DIAGNOSIS — M19079 Primary osteoarthritis, unspecified ankle and foot: Secondary | ICD-10-CM

## 2018-09-19 DIAGNOSIS — Z01812 Encounter for preprocedural laboratory examination: Secondary | ICD-10-CM

## 2018-09-19 NOTE — Telephone Encounter (Signed)
-----   Message from Evelina Bucy, DPM sent at 09/19/2018 12:01 PM EDT ----- Can we order MRI left foot to evaluate for rheumatoid nodule.  With and without contrast please.

## 2018-09-19 NOTE — Progress Notes (Signed)
Subjective:  Patient ID: Katherine Larson, female    DOB: [birth year],  MRN: [id number]  Chief Complaint  Patient presents with  . Nail Problem    right foot great toe follow up; pt stated, "doing good, no pain, no new concerns"  . Callouses    left foot lateral of 5th digit debridement    72 y.o. female presents with the above complaint.  Still having pain from the rheumatoid nodule.  Injections helped only for a little bit  Review of Systems: Negative except as noted in the HPI. Denies N/V/F/Ch.  Past Medical History:  Diagnosis Date  . Anemia    anemia of chronic disease +/- IDA followed by Dr. Earlie Server. received iron infusions in the past  . Asthma   . Borderline glaucoma   . CAD (coronary artery disease)    a. s/p CABG 1996. b. low risk nuc 2011. c. LHC 04/2016 due to drop in EF -> occluded native LAD,  widely patent sequential LIMA-D1-LAD.  Marland Kitchen Chronic kidney disease    "mild stage of kidney disease"  . Chronic systolic CHF (congestive heart failure) ([place])   . Complication of anesthesia   . DM2 (diabetes mellitus, type 2) ([place])    not on any medicine for this at this time, 05/2016  . GERD (gastroesophageal reflux disease)   . Gout   . History of hiatal hernia    in 32s  . History of pneumonia    March, 2016  . HLD (hyperlipidemia)   . HTN (hypertension)   . Hyperthyroidism 01/21/2016  . Inappropriate sinus tachycardia   . LBBB (left bundle branch block)    a. Seen in 04/2016  . Myocardial infarction ([place])    1996  . Neuropathy   . NICM (nonischemic cardiomyopathy) ([place])    a. Dx 04/2016 - EF 25-30%, diffuse HK, elevated LVEDP, mild MR, mod LAE.  Marland Kitchen NSVT (nonsustained ventricular tachycardia) ([place])   . PAT (paroxysmal atrial tachycardia) ([place])   . PONV (postoperative nausea and vomiting)    "after just about every surgery I've had"  . Rheumatoid arthritis ([facility])    RA  . Seasonal allergies     Current Outpatient Medications:  .  acetaminophen (TYLENOL) 325 MG  tablet, Take 325 mg by mouth every 6 (six) hours as needed for mild pain. , Disp: , Rfl:  .  albuterol (PROVENTIL HFA;VENTOLIN HFA) 108 (90 Base) MCG/ACT inhaler, Take 2 puffs 3 times a day and every 4 hours as needed. (Patient taking differently: Inhale into the lungs. Take 2 puffs 3 times a day and every 4 hours as needed.), Disp: , Rfl:  .  allopurinol (ZYLOPRIM) 300 MG tablet, Take 300 mg by mouth daily., Disp: , Rfl:  .  amitriptyline (ELAVIL) 10 MG tablet, Take 20 mg by mouth at bedtime. , Disp: , Rfl:  .  aspirin EC 81 MG EC tablet, Take 1 tablet (81 mg total) by mouth daily. (Patient taking differently: Take 81 mg by mouth every morning. ), Disp: , Rfl:  .  atorvastatin (LIPITOR) 80 MG tablet, Take 80 mg by mouth daily., Disp: , Rfl:  .  carvedilol (COREG) 12.5 MG tablet, TAKE 1 TABLET BY MOUTH TWO  TIMES DAILY, Disp: 180 tablet, Rfl: 2 .  fexofenadine (ALLEGRA) 180 MG tablet, Take 180 mg by mouth daily.  , Disp: , Rfl:  .  fluticasone (FLONASE) 50 MCG/ACT nasal spray, Place 2 sprays into both nostrils daily., Disp: , Rfl:  .  furosemide (LASIX)  20 MG tablet, Take 20 mg by mouth 2 (two) times daily., Disp: , Rfl:  .  gabapentin (NEURONTIN) 300 MG capsule, , Disp: , Rfl:  .  HYDROcodone-acetaminophen (NORCO) 7.5-325 MG tablet, Take 1 tablet by mouth every 4 (four) hours as needed for moderate pain., Disp: 50 tablet, Rfl: 0 .  hydroxychloroquine (PLAQUENIL) 200 MG tablet, Take 400 mg by mouth daily., Disp: , Rfl:  .  iron polysaccharides (NIFEREX) 150 MG capsule, Take 150 mg by mouth daily., Disp: , Rfl:  .  lisinopril (PRINIVIL,ZESTRIL) 5 MG tablet, TAKE 1 TABLET BY MOUTH  DAILY, Disp: 90 tablet, Rfl: 3 .  MAGNESIUM-OXIDE 400 (241.3 Mg) MG tablet, TAKE 1 TABLET BY MOUTH EVERY DAY, Disp: 30 tablet, Rfl: 6 .  metoCLOPramide (REGLAN) 5 MG tablet, Take 10 mg by mouth at bedtime. , Disp: , Rfl:  .  montelukast (SINGULAIR) 10 MG tablet, Take 10 mg by mouth at bedtime., Disp: , Rfl:  .  Multiple  Vitamins-Minerals (MACULAR VITAMIN BENEFIT PO), Take 2 tablets by mouth daily., Disp: , Rfl:  .  neomycin-polymyxin-hydrocortisone (CORTISPORIN) OTIC solution, Apply 2 drops to the ingrown toenail site twice daily. Cover with band-aid., Disp: 10 mL, Rfl: 0 .  nitroGLYCERIN (NITROSTAT) 0.4 MG SL tablet, Place 1 tablet (0.4 mg total) under the tongue every 5 (five) minutes as needed for chest pain. Reported on 03/24/2016, Disp: 25 tablet, Rfl: prn .  Omega-3 Fatty Acids (FISH OIL) 1000 MG CAPS, Take 1,000 mg by mouth 3 (three) times daily with meals. Reported on 03/24/2016, Disp: , Rfl:  .  omeprazole (PRILOSEC) 20 MG capsule, TAKE 1 CAPSULE(20 MG) BY MOUTH TWICE DAILY BEFORE A MEAL, Disp: 60 capsule, Rfl: 5 .  Polyethyl Glycol-Propyl Glycol (SYSTANE OP), Apply 1 drop to eye 3 (three) times daily as needed (Dry Eyes)., Disp: , Rfl:  .  polyethylene glycol (MIRALAX) packet, Take 17 g by mouth daily. (Patient taking differently: Take 17 g by mouth daily as needed for mild constipation. ), Disp: 60 each, Rfl: 1 .  potassium chloride SA (K-DUR,KLOR-CON) 20 MEQ tablet, Take 1 tablet (20 mEq total) by mouth 2 (two) times daily. (Patient taking differently: Take 20 mEq by mouth 2 (two) times daily as needed (when taking Lasix). ), Disp: 40 tablet, Rfl: 11 .  triamcinolone cream (KENALOG) 0.1 %, Apply 1 application topically daily as needed (for irritation). , Disp: , Rfl:   Social History   Tobacco Use  Smoking Status Former Smoker  . Years: 15.00  . Last attempt to quit: 11/20/1994  . Years since quitting: 23.8  Smokeless Tobacco Never Used  Tobacco Comment   Quit in 1996    Allergies  Allergen Reactions  . Biaxin [Clarithromycin] Rash and Other (See Comments)    Blisters in mouth   . Penicillins Rash and Other (See Comments)    Blisters in mouth   Objective:  There were no vitals filed for this visit. There is no height or weight on file to calculate BMI. Constitutional Well developed. Well  nourished.  Vascular Dorsalis pedis pulses palpable bilaterally. Posterior tibial pulses palpable bilaterally. Capillary refill normal to all digits.  No cyanosis or clubbing noted. Pedal hair growth normal.  Neurologic Normal speech. Oriented to person, place, and time. Epicritic sensation to light touch grossly present bilaterally.  Dermatologic No open wounds. Painful firm nodule sub-left first MPJ with pain to palpation  Orthopedic: Normal joint ROM without pain or crepitus bilaterally. No visible deformities. No bony tenderness.  Radiographs: None Assessment:   1. Rheumatoid nodule ([place])   2. Soft tissue mass    Plan:  Patient was evaluated and treated and all questions answered.  Left first MPJ rheumatoid nodule -Nodules still painful to palpation.  Would consider surgical excision.  Will order MRI prior to the excision.  Patient has failed conservative therapy of offloading, injection therapy. -We will follow-up after MRI to discuss surgical plan  No follow-ups on file.

## 2018-09-19 NOTE — Telephone Encounter (Signed)
Orders to J. Quintana, RN for pre-cert, faxed to [facility]. 

## 2018-09-23 NOTE — Telephone Encounter (Signed)
Received fax notice from [facility], pt can not have MRI due to pain stimulator implant.

## 2018-09-23 NOTE — Telephone Encounter (Signed)
It'll have to be CT with contrast however

## 2018-09-23 NOTE — Addendum Note (Signed)
Addended by: Harriett Sine D on: 09/23/2018 10:26 AM   Modules accepted: Orders

## 2018-09-24 ENCOUNTER — Telehealth: Payer: Self-pay | Admitting: Podiatry

## 2018-09-24 DIAGNOSIS — M19079 Primary osteoarthritis, unspecified ankle and foot: Secondary | ICD-10-CM

## 2018-09-24 NOTE — Telephone Encounter (Signed)
I informed pt of the change to [facility] for CT.

## 2018-09-24 NOTE — Addendum Note (Signed)
Addended by: Harriett Sine D on: 09/24/2018 11:39 AM   Modules accepted: Orders

## 2018-09-24 NOTE — Telephone Encounter (Signed)
I spoke with Katherine Larson main scheduling and she states she will cancel the CT at [facility] on 10/02/2018, and to put order in Epic for [facility]. Orders for CT changed to [facility] and given to Gretta Arab, RN for pre-cert.

## 2018-09-24 NOTE — Telephone Encounter (Signed)
Patient husband called and asked why can't his wife go to [facility] to get her CT scan since that's around the corner from their house. Please give patient husband a call back.

## 2018-10-01 ENCOUNTER — Other Ambulatory Visit: Payer: Self-pay

## 2018-10-01 ENCOUNTER — Encounter: Payer: Self-pay | Admitting: Internal Medicine

## 2018-10-01 ENCOUNTER — Ambulatory Visit: Payer: Medicare Other | Admitting: Internal Medicine

## 2018-10-01 VITALS — BP 135/68 | HR 79 | Temp 97.8°F | Ht 62.0 in | Wt 172.8 lb

## 2018-10-01 DIAGNOSIS — R197 Diarrhea, unspecified: Secondary | ICD-10-CM

## 2018-10-01 DIAGNOSIS — K219 Gastro-esophageal reflux disease without esophagitis: Secondary | ICD-10-CM

## 2018-10-01 NOTE — Patient Instructions (Addendum)
GERD information provided  Increase omeprazole to 40 mg twice daily (Disp 60 tablets) 3 refills  Use Imodium as needed for diarrhea  Stoll sample for Cdiff, GIP  Serum TTG IGA and total IGA  Further recommendations to follow - and further testing may beineeded   We will retrieve record from [facility] - 2017     Thank you for entrusting me with your care. I appreciate the opportunity to create valuable relationships with patients and family members. You may receive a questionnaire in the mail regarding your visit here today.  It would be appreciated if you would take the time to return it.  If you were not completely satisfied with your experience, I would love to discuss any concerns with you.   Bridgette Habermann, M.D. [facility]

## 2018-10-01 NOTE — Progress Notes (Signed)
Primary Care Physician:  Harlan Stains, MD Primary Gastroenterologist:  Dr. Gala Romney  Pre-Procedure History & Physical: HPI:  Katherine Larson is a 72 y.o. female here for recurrent diarrhea.  Now 6-week history of incessant nonbloody, watery diarrhea.  Day and night.  Occasional accidents.  No associated abdominal pain, no nausea or vomiting.  Has been on Reglan for years now prescribed elsewhere for presumed gastroparesis; long-standing GERD -  takes omeprazole 20 mg twice daily still has breakthrough nocturnal reflux symptoms;  no dysphagia since her esophagus was dilated by me a few years ago.  Last colonoscopy 2013-hyperplastic polyp. Has had long episodes of diarrhea in the past which have resolved.  She is a type II diabetic -  on no medications at this time. No artificial sweeteners.  No history of dairy intolerance.  Patient states stools are not greasy particularly malodorous or difficult to flush.  She has is never been screened for celiac disease.  He has rheumatoid arthritis felt to be under good control.  She also history has a history of glaucoma.  Is been taking Pepto-Bismol and Kaopectate for the diarrhea.  EGD, colonoscopy and capsule study for anemia previously without significant findings by report.  Past Medical History:  Diagnosis Date  . Anemia    anemia of chronic disease +/- IDA followed by Dr. Earlie Server. received iron infusions in the past  . Asthma   . Borderline glaucoma   . CAD (coronary artery disease)    a. s/p CABG 1996. b. low risk nuc 2011. c. LHC 04/2016 due to drop in EF -> occluded native LAD,  widely patent sequential LIMA-D1-LAD.  Marland Kitchen Chronic kidney disease    "mild stage of kidney disease"  . Chronic systolic CHF (congestive heart failure) ([place])   . Complication of anesthesia   . DM2 (diabetes mellitus, type 2) ([place])    not on any medicine for this at this time, 05/2016  . GERD (gastroesophageal reflux disease)   . Gout   . History of hiatal hernia      in 33s  . History of pneumonia    March, 2016  . HLD (hyperlipidemia)   . HTN (hypertension)   . Hyperthyroidism 01/21/2016  . Inappropriate sinus tachycardia   . LBBB (left bundle branch block)    a. Seen in 04/2016  . Myocardial infarction ([place])    1996  . Neuropathy   . NICM (nonischemic cardiomyopathy) ([place])    a. Dx 04/2016 - EF 25-30%, diffuse HK, elevated LVEDP, mild MR, mod LAE.  Marland Kitchen NSVT (nonsustained ventricular tachycardia) ([place])   . PAT (paroxysmal atrial tachycardia) ([place])   . PONV (postoperative nausea and vomiting)    "after just about every surgery I've had"  . Rheumatoid arthritis ([facility])    RA  . Seasonal allergies     Past Surgical History:  Procedure Laterality Date  . ABDOMINAL HYSTERECTOMY    . APPENDECTOMY    . BACK SURGERY    . CARDIAC CATHETERIZATION N/A 05/01/2016   Procedure: Right/Left Heart Cath and Coronary/Graft Angiography;  Surgeon: Leonie Man, MD;  Location: [facility];  Service: Cardiovascular;  Laterality: N/A;  . CHOLECYSTECTOMY    . COLONOSCOPY  11/2011   Dr. Magod:ext/int hemorrhoids, diverticulosis sigmoid colon and distal desc colon, mid-desc colon hyperplastic polyps.   . CORONARY ARTERY BYPASS GRAFT  1996   2 vessels  . ESOPHAGOGASTRODUODENOSCOPY  11/2011   Dr. Watt Climes: small hiatal hernia, one non-bleeding superficial gastric ulcer, medium-sized  diverticulum in area of papilla  . ESOPHAGOGASTRODUODENOSCOPY N/A 12/29/2015   Dr. Gala Romney: Chronic inactive gastritis, normal esophagus status post dilation, duodenal diverticula  . EYE SURGERY Bilateral   . FRACTURE SURGERY Right    arm  . GIVENS CAPSULE STUDY  11/2012   Dr. Watt Climes: minimal gastritis, normal small bowel capsule  . HERNIA REPAIR     hiatal hernia  . MAXIMUM ACCESS (MAS)POSTERIOR LUMBAR INTERBODY FUSION (PLIF) 1 LEVEL N/A 08/14/2013   Procedure:  MAXIMUM ACCESS SURGERY(MAS) POSTERIOR LUMBAR INTERBODY FUSION LUMBAR THREE-FOUR ;  Surgeon: Eustace Moore, MD;  Location: [facility];  Service: Neurosurgery;  Laterality: N/A;   MAXIMUM ACCESS SURGERY(MAS) POSTERIOR LUMBAR INTERBODY FUSION LUMBAR THREE-FOUR   . PTCA    . SPINAL CORD STIMULATOR INSERTION N/A 06/16/2016   Procedure: LUMBAR SPINAL CORD STIMULATOR INSERTION;  Surgeon: Clydell Hakim, MD;  Location: [facility];  Service: Neurosurgery;  Laterality: N/A;  LUMBAR SPINAL CORD STIMULATOR INSERTION  . tens  05/2016  . TONSILLECTOMY      Prior to Admission medications   Medication Sig Start Date End Date Taking? Authorizing Provider  acetaminophen (TYLENOL) 325 MG tablet Take 325 mg by mouth every 6 (six) hours as needed for mild pain.    Yes [provider]  albuterol (PROVENTIL HFA;VENTOLIN HFA) 108 (90 Base) MCG/ACT inhaler Take 2 puffs 3 times a day and every 4 hours as needed. Patient taking differently: Inhale into the lungs. Take 2 puffs 3 times a day and every 4 hours as needed. 01/24/16  Yes Rexene Alberts, MD  allopurinol (ZYLOPRIM) 300 MG tablet Take 300 mg by mouth daily.   Yes [provider]  aspirin EC 81 MG EC tablet Take 1 tablet (81 mg total) by mouth daily. Patient taking differently: Take 81 mg by mouth every morning.  05/03/16  Yes Kilroy, Luke K, PA-C  atorvastatin (LIPITOR) 80 MG tablet Take 80 mg by mouth daily.   Yes [provider]  carvedilol (COREG) 12.5 MG tablet TAKE 1 TABLET BY MOUTH TWO  TIMES DAILY 05/08/18  Yes Evans Lance, MD  fexofenadine (ALLEGRA) 180 MG tablet Take 180 mg by mouth daily.     Yes [provider]  fluticasone (FLONASE) 50 MCG/ACT nasal spray Place 2 sprays into both nostrils daily.   Yes [provider]  furosemide (LASIX) 20 MG tablet Take 20 mg by mouth 2 (two) times daily.   Yes [provider]  gabapentin (NEURONTIN) 300 MG capsule Take 300 mg by mouth at bedtime.  03/01/18  Yes [provider]  HYDROcodone-acetaminophen (NORCO) 7.5-325 MG tablet Take 1 tablet by mouth every 4 (four) hours as  needed for moderate pain. 06/16/16  Yes Clydell Hakim, MD  hydroxychloroquine (PLAQUENIL) 200 MG tablet Take 400 mg by mouth daily.   Yes [provider]  lisinopril (PRINIVIL,ZESTRIL) 5 MG tablet TAKE 1 TABLET BY MOUTH  DAILY 02/25/18  Yes Josue Hector, MD  MAGNESIUM-OXIDE 400 (241.3 Mg) MG tablet TAKE 1 TABLET BY MOUTH EVERY DAY 08/19/18  Yes Josue Hector, MD  metoCLOPramide (REGLAN) 5 MG tablet Take 10 mg by mouth at bedtime.    Yes [provider]  montelukast (SINGULAIR) 10 MG tablet Take 10 mg by mouth at bedtime.   Yes [provider]  Multiple Vitamins-Minerals (MACULAR VITAMIN BENEFIT PO) Take 2 tablets by mouth daily.   Yes [provider]  nitroGLYCERIN (NITROSTAT) 0.4 MG SL tablet Place 1 tablet (0.4 mg total)  under the tongue every 5 (five) minutes as needed for chest pain. Reported on 03/24/2016 06/22/17  Yes Evans Lance, MD  omeprazole (PRILOSEC) 20 MG capsule TAKE 1 CAPSULE(20 MG) BY MOUTH TWICE DAILY BEFORE A MEAL 04/17/17  Yes Carlis Stable, NP  Polyethyl Glycol-Propyl Glycol (SYSTANE OP) Apply 1 drop to eye 3 (three) times daily as needed (Dry Eyes).   Yes [provider]  polyethylene glycol (MIRALAX) packet Take 17 g by mouth daily. Patient taking differently: Take 17 g by mouth daily as needed for mild constipation.  01/09/16  Yes Ward, Delice Bison, DO  potassium chloride SA (K-DUR,KLOR-CON) 20 MEQ tablet Take 1 tablet (20 mEq total) by mouth 2 (two) times daily. Patient taking differently: Take 20 mEq by mouth 2 (two) times daily as needed (when taking Lasix).  05/03/16  Yes Kilroy, Luke K, PA-C  triamcinolone cream (KENALOG) 0.1 % Apply 1 application topically daily as needed (for irritation).    Yes [provider]  amitriptyline (ELAVIL) 10 MG tablet Take 20 mg by mouth at bedtime.     [provider]  iron polysaccharides (NIFEREX) 150 MG capsule Take 150 mg by mouth daily.    [provider]    neomycin-polymyxin-hydrocortisone (CORTISPORIN) OTIC solution Apply 2 drops to the ingrown toenail site twice daily. Cover with band-aid. Patient not taking: Reported on 10/01/2018 08/15/18   Evelina Bucy, DPM  Omega-3 Fatty Acids (FISH OIL) 1000 MG CAPS Take 1,000 mg by mouth 3 (three) times daily with meals. Reported on 03/24/2016    [provider]    Allergies as of 10/01/2018 - Review Complete 10/01/2018  Allergen Reaction Noted  . Biaxin [clarithromycin] Rash and Other (See Comments) 02/02/2010  . Penicillins Rash and Other (See Comments) 02/02/2010    Family History  Problem Relation Age of Onset  . Heart attack Father   . Heart attack Mother   . Bladder Cancer Brother   . Cervical cancer Daughter        ?  . Colon cancer Neg Hx     Social History   Socioeconomic History  . Marital status: Married    Spouse name: Not on file  . Number of children: 3  . Years of education: Not on file  . Highest education level: Not on file  Occupational History  . Not on file  Social Needs  . Financial resource strain: Not on file  . Food insecurity:    Worry: Not on file    Inability: Not on file  . Transportation needs:    Medical: Not on file    Non-medical: Not on file  Tobacco Use  . Smoking status: Former Smoker    Years: 15.00    Last attempt to quit: 11/20/1994    Years since quitting: 23.8  . Smokeless tobacco: Never Used  . Tobacco comment: Quit in 1996  Substance and Sexual Activity  . Alcohol use: No    Alcohol/week: 0.0 standard drinks  . Drug use: No  . Sexual activity: Yes  Lifestyle  . Physical activity:    Days per week: Not on file    Minutes per session: Not on file  . Stress: Not on file  Relationships  . Social connections:    Talks on phone: Not on file    Gets together: Not on file    Attends religious service: Not on file    Active member of club or organization: Not on file  Attends meetings of clubs or organizations: Not on  file    Relationship status: Not on file  . Intimate partner violence:    Fear of current or ex partner: Not on file    Emotionally abused: Not on file    Physically abused: Not on file    Forced sexual activity: Not on file  Other Topics Concern  . Not on file  Social History Narrative   2 living children, one deceased age 32    Review of Systems: See HPI, otherwise negative ROS  Physical Exam: BP 135/68   Pulse 79   Temp 97.8 F (36.6 C) (Oral)   Ht 5\' 2"  (1.575 m)   Wt 172 lb 12.8 oz (78.4 kg)   BMI 31.61 kg/m  General:   Alert,  pleasant and cooperative in NAD -  Skin:  Intact without significant lesions or rashes. Eyes:  Sclera clear, no icterus.   Conjunctiva pink. Ears:  Normal auditory acuity. Nose:  No deformity, discharge,  or lesions. Mouth:  No deformity or lesions. Neck:  Supple; no masses or thyromegaly. No significant cervical adenopathy. Lungs:  Clear throughout to auscultation.   No wheezes, crackles, or rhonchi. No acute distress. Heart:  Regular rate and rhythm; no murmurs, clicks, rubs,  or gallops. Abdomen: Non-distended, normal bowel sounds.  Soft and nontender without appreciable mass or hepatosplenomegaly.  Pulses:  Normal pulses noted. Extremities:  Without clubbing or edema.  Impression: Pleasant 72 year old lady with multiple medical problems now with a 6-week history of incessant nonbloody watery diarrhea.  Need to rule out an infectious process.  I do not see any medications to implicate.  At this point differential would include, microscopic colitis, diabetic enteropathy or pancreatic exocrine insufficiency and occult celiac disease . Underlying irritable bowel syndrome also remains in the differential at this time.  Recommendations:  GERD information provided  Increase omeprazole to 40 mg twice daily (Disp 60 tablets) 3 refills  Use Imodium as needed for diarrhea  Stoll sample for Cdiff, GIP  Serum TTG IGA and total IGA  Further  recommendations to follow - and further testing may beineeded   We will retrieve record from [facility] - 2017    Notice: This dictation was prepared with Dragon dictation along with smaller phrase technology. Any transcriptional errors that result from this process are unintentional and may not be corrected upon review.

## 2018-10-02 ENCOUNTER — Ambulatory Visit (HOSPITAL_COMMUNITY): Payer: Medicare Other

## 2018-10-02 ENCOUNTER — Other Ambulatory Visit: Payer: Self-pay

## 2018-10-02 ENCOUNTER — Encounter (HOSPITAL_COMMUNITY): Payer: Self-pay

## 2018-10-02 MED ORDER — OMEPRAZOLE 40 MG PO CPDR: 40.0000 mg | DELAYED_RELEASE_CAPSULE | Freq: Two times a day (BID) | ORAL | 3 refills | Status: DC

## 2018-10-07 LAB — GASTROINTESTINAL PATHOGEN PANEL PCR
C. difficile Tox A/B, PCR: NOT DETECTED
Campylobacter, PCR: NOT DETECTED
Cryptosporidium, PCR: NOT DETECTED
E coli (ETEC) LT/ST PCR: NOT DETECTED
E coli (STEC) stx1/stx2, PCR: NOT DETECTED
E coli 0157, PCR: NOT DETECTED
Giardia lamblia, PCR: NOT DETECTED
Norovirus, PCR: NOT DETECTED
Rotavirus A, PCR: NOT DETECTED
Salmonella, PCR: NOT DETECTED
Shigella, PCR: NOT DETECTED

## 2018-10-07 LAB — C. DIFFICILE GDH AND TOXIN A/B
GDH ANTIGEN: NOT DETECTED
MICRO NUMBER:: 91381646
SPECIMEN QUALITY:: ADEQUATE
TOXIN A AND B: NOT DETECTED

## 2018-10-07 LAB — IGA: Immunoglobulin A: 140 mg/dL (ref 20–320)

## 2018-10-07 LAB — TISSUE TRANSGLUTAMINASE, IGG: (tTG) Ab, IgG: 1 U/mL

## 2018-10-21 ENCOUNTER — Ambulatory Visit (HOSPITAL_COMMUNITY)
Admission: RE | Admit: 2018-10-21 | Discharge: 2018-10-21 | Disposition: A | Discharge status: Home / Self Care | Payer: Medicare Other | Source: Ambulatory Visit | Attending: Podiatry | Admitting: Podiatry

## 2018-10-21 DIAGNOSIS — M19079 Primary osteoarthritis, unspecified ankle and foot: Secondary | ICD-10-CM | POA: Insufficient documentation

## 2018-12-05 ENCOUNTER — Ambulatory Visit: Payer: Medicare Other | Admitting: Podiatry

## 2018-12-19 ENCOUNTER — Ambulatory Visit: Payer: Medicare Other | Admitting: Podiatry

## 2018-12-19 DIAGNOSIS — M7989 Other specified soft tissue disorders: Secondary | ICD-10-CM | POA: Diagnosis not present

## 2018-12-19 DIAGNOSIS — M0630 Rheumatoid nodule, unspecified site: Secondary | ICD-10-CM

## 2018-12-19 DIAGNOSIS — E1142 Type 2 diabetes mellitus with diabetic polyneuropathy: Secondary | ICD-10-CM | POA: Diagnosis not present

## 2018-12-19 NOTE — Progress Notes (Signed)
Subjective:  Patient ID: Katherine Larson, female    DOB: 03-28-1946,  MRN: [id number]  Chief Complaint  Patient presents with  . Foot Pain    left foot - CT results today    73 y.o. female presents with the above complaint.  Still having pain from the rheumatoid nodules worst with standing.  States that the nodule is making her left great toe cocked up in the top of her shoe.  Would like to discuss the CT scan results and possible removal.  Review of Systems: Negative except as noted in the HPI. Denies N/V/F/Ch.  Past Medical History:  Diagnosis Date  . Anemia    anemia of chronic disease +/- IDA followed by Dr. Earlie Server. received iron infusions in the past  . Asthma   . Borderline glaucoma   . CAD (coronary artery disease)    a. s/p CABG 1996. b. low risk nuc 2011. c. LHC 04/2016 due to drop in EF -> occluded native LAD,  widely patent sequential LIMA-D1-LAD.  Marland Kitchen Chronic kidney disease    "mild stage of kidney disease"  . Chronic systolic CHF (congestive heart failure) ([place])   . Complication of anesthesia   . DM2 (diabetes mellitus, type 2) ([place])    not on any medicine for this at this time, 05/2016  . GERD (gastroesophageal reflux disease)   . Gout   . History of hiatal hernia    in 65s  . History of pneumonia    March, 2016  . HLD (hyperlipidemia)   . HTN (hypertension)   . Hyperthyroidism 01/21/2016  . Inappropriate sinus tachycardia   . LBBB (left bundle branch block)    a. Seen in 04/2016  . Myocardial infarction ([place])    1996  . Neuropathy   . NICM (nonischemic cardiomyopathy) ([place])    a. Dx 04/2016 - EF 25-30%, diffuse HK, elevated LVEDP, mild MR, mod LAE.  Marland Kitchen NSVT (nonsustained ventricular tachycardia) ([place])   . PAT (paroxysmal atrial tachycardia) ([place])   . PONV (postoperative nausea and vomiting)    "after just about every surgery I've had"  . Rheumatoid arthritis ([facility])    RA  . Seasonal allergies     Current Outpatient Medications:  .  acetaminophen  (TYLENOL) 325 MG tablet, Take 325 mg by mouth every 6 (six) hours as needed for mild pain. , Disp: , Rfl:  .  albuterol (PROVENTIL HFA;VENTOLIN HFA) 108 (90 Base) MCG/ACT inhaler, Take 2 puffs 3 times a day and every 4 hours as needed. (Patient taking differently: Inhale into the lungs. Take 2 puffs 3 times a day and every 4 hours as needed.), Disp: , Rfl:  .  allopurinol (ZYLOPRIM) 300 MG tablet, Take 300 mg by mouth daily., Disp: , Rfl:  .  amitriptyline (ELAVIL) 10 MG tablet, Take 20 mg by mouth at bedtime. , Disp: , Rfl:  .  aspirin EC 81 MG EC tablet, Take 1 tablet (81 mg total) by mouth daily. (Patient taking differently: Take 81 mg by mouth every morning. ), Disp: , Rfl:  .  atorvastatin (LIPITOR) 80 MG tablet, Take 80 mg by mouth daily., Disp: , Rfl:  .  carvedilol (COREG) 12.5 MG tablet, TAKE 1 TABLET BY MOUTH TWO  TIMES DAILY, Disp: 180 tablet, Rfl: 2 .  fexofenadine (ALLEGRA) 180 MG tablet, Take 180 mg by mouth daily.  , Disp: , Rfl:  .  fluticasone (FLONASE) 50 MCG/ACT nasal spray, Place 2 sprays into both nostrils daily., Disp: , Rfl:  .  furosemide (LASIX) 20 MG tablet, Take 20 mg by mouth 2 (two) times daily., Disp: , Rfl:  .  gabapentin (NEURONTIN) 300 MG capsule, Take 300 mg by mouth at bedtime. , Disp: , Rfl:  .  HYDROcodone-acetaminophen (NORCO) 7.5-325 MG tablet, Take 1 tablet by mouth every 4 (four) hours as needed for moderate pain., Disp: 50 tablet, Rfl: 0 .  hydroxychloroquine (PLAQUENIL) 200 MG tablet, Take 400 mg by mouth daily., Disp: , Rfl:  .  iron polysaccharides (NIFEREX) 150 MG capsule, Take 150 mg by mouth daily., Disp: , Rfl:  .  lisinopril (PRINIVIL,ZESTRIL) 5 MG tablet, TAKE 1 TABLET BY MOUTH  DAILY, Disp: 90 tablet, Rfl: 3 .  MAGNESIUM-OXIDE 400 (241.3 Mg) MG tablet, TAKE 1 TABLET BY MOUTH EVERY DAY, Disp: 30 tablet, Rfl: 6 .  metoCLOPramide (REGLAN) 5 MG tablet, Take 10 mg by mouth at bedtime. , Disp: , Rfl:  .  montelukast (SINGULAIR) 10 MG tablet, Take 10 mg  by mouth at bedtime., Disp: , Rfl:  .  Multiple Vitamins-Minerals (MACULAR VITAMIN BENEFIT PO), Take 2 tablets by mouth daily., Disp: , Rfl:  .  neomycin-polymyxin-hydrocortisone (CORTISPORIN) OTIC solution, Apply 2 drops to the ingrown toenail site twice daily. Cover with band-aid. (Patient not taking: Reported on 10/01/2018), Disp: 10 mL, Rfl: 0 .  nitroGLYCERIN (NITROSTAT) 0.4 MG SL tablet, Place 1 tablet (0.4 mg total) under the tongue every 5 (five) minutes as needed for chest pain. Reported on 03/24/2016, Disp: 25 tablet, Rfl: prn .  Omega-3 Fatty Acids (FISH OIL) 1000 MG CAPS, Take 1,000 mg by mouth 3 (three) times daily with meals. Reported on 03/24/2016, Disp: , Rfl:  .  omeprazole (PRILOSEC) 20 MG capsule, TAKE 1 CAPSULE(20 MG) BY MOUTH TWICE DAILY BEFORE A MEAL, Disp: 60 capsule, Rfl: 5 .  omeprazole (PRILOSEC) 40 MG capsule, Take 1 capsule (40 mg total) by mouth 2 (two) times daily., Disp: 60 capsule, Rfl: 3 .  Polyethyl Glycol-Propyl Glycol (SYSTANE OP), Apply 1 drop to eye 3 (three) times daily as needed (Dry Eyes)., Disp: , Rfl:  .  polyethylene glycol (MIRALAX) packet, Take 17 g by mouth daily. (Patient taking differently: Take 17 g by mouth daily as needed for mild constipation. ), Disp: 60 each, Rfl: 1 .  potassium chloride SA (K-DUR,KLOR-CON) 20 MEQ tablet, Take 1 tablet (20 mEq total) by mouth 2 (two) times daily. (Patient taking differently: Take 20 mEq by mouth 2 (two) times daily as needed (when taking Lasix). ), Disp: 40 tablet, Rfl: 11 .  triamcinolone cream (KENALOG) 0.1 %, Apply 1 application topically daily as needed (for irritation). , Disp: , Rfl:   Social History   Tobacco Use  Smoking Status Former Smoker  . Years: 15.00  . Last attempt to quit: 11/20/1994  . Years since quitting: 24.0  Smokeless Tobacco Never Used  Tobacco Comment   Quit in 1996    Allergies  Allergen Reactions  . Biaxin [Clarithromycin] Rash and Other (See Comments)    Blisters in mouth   .  Penicillins Rash and Other (See Comments)    Blisters in mouth   Objective:  There were no vitals filed for this visit. There is no height or weight on file to calculate BMI. Constitutional Well developed. Well nourished.  Vascular Dorsalis pedis pulses palpable bilaterally. Posterior tibial pulses palpable bilaterally. Capillary refill normal to all digits.  No cyanosis or clubbing noted. Pedal hair growth normal.  Neurologic Normal speech. Oriented to person, place, and time. Epicritic  sensation to light touch grossly present bilaterally.  Dermatologic No open wounds. Painful firm nodule sub-left first MPJ with pain to palpation  Orthopedic: Normal joint ROM without pain or crepitus bilaterally. No visible deformities. No bony tenderness.   Radiographs: None Assessment:   1. Rheumatoid nodule ([place])   2. Soft tissue mass   3. DM type 2 with diabetic peripheral neuropathy ([place])    Plan:  Patient was evaluated and treated and all questions answered.  Left first MPJ rheumatoid nodules -CT reviewed with patient consisting of masses of the first and fifth MPJ first MPJ 3 x 3 x 3 0.1 x 1/5 MPJ 1.7 x 1.6 x 0.6. -Patient having pain from these masses worse with standing and states that the mass is causing her toe to cock up.  Desires removal. -Diabetic with well-controlled with last A1c less than 6. -Patient has failed all conservative therapy and wishes to proceed with surgical intervention. All risks, benefits, and alternatives discussed with patient. No guarantees given. Consent reviewed and signed by patient. -Planned procedures: Removal rheumatoid nodules left 1st/5th MPJ -Needs PCP clearance.   Return for price post op.

## 2018-12-19 NOTE — Patient Instructions (Signed)
Pre-Operative Instructions  Congratulations, you have decided to take an important step towards improving your quality of life.  You can be assured that the doctors and staff at [facility] will be with you every step of the way.  Here are some important things you should know:  1. Plan to be at the surgery center/hospital at least 1 (one) hour prior to your scheduled time, unless otherwise directed by the surgical center/hospital staff.  You must have a responsible adult accompany you, remain during the surgery and drive you home.  Make sure you have directions to the surgical center/hospital to ensure you arrive on time. 2. If you are having surgery at [facility] or [facility] hospitals, you will need a copy of your medical history and physical form from your family physician within one month prior to the date of surgery. We will give you a form for your primary physician to complete.  3. We make every effort to accommodate the date you request for surgery.  However, there are times where surgery dates or times have to be moved.  We will contact you as soon as possible if a change in schedule is required.   4. No aspirin/ibuprofen for one week before surgery.  If you are on aspirin, any non-steroidal anti-inflammatory medications (Mobic, Aleve, Ibuprofen) should not be taken seven (7) days prior to your surgery.  You make take Tylenol for pain prior to surgery.  5. Medications - If you are taking daily heart and blood pressure medications, seizure, reflux, allergy, asthma, anxiety, pain or diabetes medications, make sure you notify the surgery center/hospital before the day of surgery so they can tell you which medications you should take or avoid the day of surgery. 6. No food or drink after midnight the night before surgery unless directed otherwise by surgical center/hospital staff. 7. No alcoholic beverages 24-hours prior to surgery.  No smoking 24-hours prior or 24-hours after  surgery. 8. Wear loose pants or shorts. They should be loose enough to fit over bandages, boots, and casts. 9. Don't wear slip-on shoes. Sneakers are preferred. 10. Bring your boot with you to the surgery center/hospital.  Also bring crutches or a walker if your physician has prescribed it for you.  If you do not have this equipment, it will be provided for you after surgery. 11. If you have not been contacted by the surgery center/hospital by the day before your surgery, call to confirm the date and time of your surgery. 12. Leave-time from work may vary depending on the type of surgery you have.  Appropriate arrangements should be made prior to surgery with your employer. 13. Prescriptions will be provided immediately following surgery by your doctor.  Fill these as soon as possible after surgery and take the medication as directed. Pain medications will not be refilled on weekends and must be approved by the doctor. 14. Remove nail polish on the operative foot and avoid getting pedicures prior to surgery. 15. Wash the night before surgery.  The night before surgery wash the foot and leg well with water and the antibacterial soap provided. Be sure to pay special attention to beneath the toenails and in between the toes.  Wash for at least three (3) minutes. Rinse thoroughly with water and dry well with a towel.  Perform this wash unless told not to do so by your physician.  Enclosed: 1 Ice pack (please put in freezer the night before surgery)   1 Hibiclens skin cleaner     Pre-op instructions  If you have any questions regarding the instructions, please do not hesitate to call our office.  [place]: [address], [place], [place] [address] -- [phone]  [place]: [address] , [place] [address] -- [phone]  [place]: [address]  [place], [place] [address] -- [phone]  [place]: [address], [address], [place], [place] [address] -- [phone]  Website:  [email or web address] 

## 2018-12-24 ENCOUNTER — Telehealth: Payer: Self-pay | Admitting: *Deleted

## 2018-12-24 NOTE — Telephone Encounter (Signed)
"  I'm scheduled to have surgery with Dr. March Rummage.  I lost my paperwork.  Can you tell me where my surgery will be done?"  Your surgery will be at [facility] and they are located at [place]. [employer].  "Do you know what time I'm supposed to be there?"  Someone from the surgical center will call you a day or two prior to your surgery date and they will give you your arrival time.

## 2018-12-31 ENCOUNTER — Telehealth: Payer: Self-pay | Admitting: *Deleted

## 2018-12-31 NOTE — Telephone Encounter (Signed)
"  I am scheduled for surgery on February 26 with Dr. March Rummage.  I called my insurance and I'm for sure that they said the surgery center is not in-network with my [facility]."  The surgical center is in-network with  [facility].  "I haven't been given a time for my surgery yet."  Someone from the surgical center will call you a day or two prior to your surgical date and they will give you your arrival time.

## 2019-01-02 NOTE — Progress Notes (Signed)
Cardiology Office Note    Date:  01/06/2019  ID:  Katherine Larson, DOB February 20, 1946, MRN [id number] PCP:  Harlan Stains, MD  Cardiologist:  Dr. [name] Cancel Linna Hoff), EP - Dr. [name]   Chief Complaint: CABG   History of Present Illness:    73 y.o. CABG with LIMA 1996 Most recent EF 45-50% NSVT, PAT, LBBB Hyperthyroidism activity limited by chronic back pain. Seen a lot by GT for evaluation AICD not needed. Tends to be tachycardic    Had syncopal spell June 2017 event monitor showed no arrhythmia Cath 05/01/16 patent LIMA normal right heart pressures No significant circumflex or RCA disease  No chest pain some LE edema and weight gain  She needs left foot surgery with Dr March Rummage should be no issues with her heart Has spinal stimulator for her back  Past Medical History:  Diagnosis Date  . Anemia    anemia of chronic disease +/- IDA followed by Dr. Earlie Server. received iron infusions in the past  . Asthma   . Borderline glaucoma   . CAD (coronary artery disease)    a. s/p CABG 1996. b. low risk nuc 2011. c. LHC 04/2016 due to drop in EF -> occluded native LAD,  widely patent sequential LIMA-D1-LAD.  Marland Kitchen Chronic kidney disease    "mild stage of kidney disease"  . Chronic systolic CHF (congestive heart failure) ([place])   . Complication of anesthesia   . DM2 (diabetes mellitus, type 2) ([place])    not on any medicine for this at this time, 05/2016  . GERD (gastroesophageal reflux disease)   . Gout   . History of hiatal hernia    in 35s  . History of pneumonia    March, 2016  . HLD (hyperlipidemia)   . HTN (hypertension)   . Hyperthyroidism 01/21/2016  . Inappropriate sinus tachycardia   . LBBB (left bundle branch block)    a. Seen in 04/2016  . Myocardial infarction ([place])    1996  . Neuropathy   . NICM (nonischemic cardiomyopathy) ([place])    a. Dx 04/2016 - EF 25-30%, diffuse HK, elevated LVEDP, mild MR, mod LAE.  Marland Kitchen NSVT (nonsustained ventricular tachycardia) ([place])   . PAT (paroxysmal  atrial tachycardia) ([place])   . PONV (postoperative nausea and vomiting)    "after just about every surgery I've had"  . Rheumatoid arthritis (H[facility])    RA  . Seasonal allergies     Past Surgical History:  Procedure Laterality Date  . ABDOMINAL HYSTERECTOMY    . APPENDECTOMY    . BACK SURGERY    . CARDIAC CATHETERIZATION N/A 05/01/2016   Procedure: Right/Left Heart Cath and Coronary/Graft Angiography;  Surgeon: Leonie Man, MD;  Location: [facility];  Service: Cardiovascular;  Laterality: N/A;  . CHOLECYSTECTOMY    . COLONOSCOPY  11/2011   Dr. Magod:ext/int hemorrhoids, diverticulosis sigmoid colon and distal desc colon, mid-desc colon hyperplastic polyps.   . CORONARY ARTERY BYPASS GRAFT  1996   2 vessels  . ESOPHAGOGASTRODUODENOSCOPY  11/2011   Dr. Watt Climes: small hiatal hernia, one non-bleeding superficial gastric ulcer, medium-sized diverticulum in area of papilla  . ESOPHAGOGASTRODUODENOSCOPY N/A 12/29/2015   Dr. Gala Romney: Chronic inactive gastritis, normal esophagus status post dilation, duodenal diverticula  . EYE SURGERY Bilateral   . FRACTURE SURGERY Right    arm  . GIVENS CAPSULE STUDY  11/2012   Dr. Watt Climes: minimal gastritis, normal small bowel capsule  . HERNIA REPAIR     hiatal hernia  .  MAXIMUM ACCESS (MAS)POSTERIOR LUMBAR INTERBODY FUSION (PLIF) 1 LEVEL N/A 08/14/2013   Procedure:  MAXIMUM ACCESS SURGERY(MAS) POSTERIOR LUMBAR INTERBODY FUSION LUMBAR THREE-FOUR ;  Surgeon: Eustace Moore, MD;  Location: [place] [facility];  Service: Neurosurgery;  Laterality: N/A;   MAXIMUM ACCESS SURGERY(MAS) POSTERIOR LUMBAR INTERBODY FUSION LUMBAR THREE-FOUR   . PTCA    . SPINAL CORD STIMULATOR INSERTION N/A 06/16/2016   Procedure: LUMBAR SPINAL CORD STIMULATOR INSERTION;  Surgeon: Clydell Hakim, MD;  Location: [place] [facility];  Service: Neurosurgery;  Laterality: N/A;  LUMBAR SPINAL CORD STIMULATOR INSERTION  . tens  05/2016  . TONSILLECTOMY      Current Medications: Outpatient Medications  Prior to Visit  Medication Sig Dispense Refill  . acetaminophen (TYLENOL) 325 MG tablet Take 325 mg by mouth every 6 (six) hours as needed for mild pain.     Marland Kitchen albuterol (PROVENTIL HFA;VENTOLIN HFA) 108 (90 Base) MCG/ACT inhaler Take 2 puffs 3 times a day and every 4 hours as needed. (Patient taking differently: Inhale into the lungs. Take 2 puffs 3 times a day and every 4 hours as needed.)    . allopurinol (ZYLOPRIM) 300 MG tablet Take 300 mg by mouth daily.    Marland Kitchen amitriptyline (ELAVIL) 10 MG tablet Take 20 mg by mouth at bedtime.     Marland Kitchen aspirin EC 81 MG EC tablet Take 1 tablet (81 mg total) by mouth daily. (Patient taking differently: Take 81 mg by mouth every morning. )    . atorvastatin (LIPITOR) 80 MG tablet Take 80 mg by mouth daily.    . carvedilol (COREG) 12.5 MG tablet TAKE 1 TABLET BY MOUTH TWO  TIMES DAILY 180 tablet 2  . fexofenadine (ALLEGRA) 180 MG tablet Take 180 mg by mouth daily.      . fluticasone (FLONASE) 50 MCG/ACT nasal spray Place 2 sprays into both nostrils daily.    . furosemide (LASIX) 20 MG tablet Take 20 mg by mouth 2 (two) times daily.    Marland Kitchen gabapentin (NEURONTIN) 300 MG capsule Take 300 mg by mouth at bedtime.     Marland Kitchen HYDROcodone-acetaminophen (NORCO) 7.5-325 MG tablet Take 1 tablet by mouth every 4 (four) hours as needed for moderate pain. 50 tablet 0  . hydroxychloroquine (PLAQUENIL) 200 MG tablet Take 400 mg by mouth daily.    . iron polysaccharides (NIFEREX) 150 MG capsule Take 150 mg by mouth daily.    Marland Kitchen lisinopril (PRINIVIL,ZESTRIL) 5 MG tablet TAKE 1 TABLET BY MOUTH  DAILY 90 tablet 3  . MAGNESIUM-OXIDE 400 (241.3 Mg) MG tablet TAKE 1 TABLET BY MOUTH EVERY DAY 30 tablet 6  . metoCLOPramide (REGLAN) 5 MG tablet Take 10 mg by mouth at bedtime.     . montelukast (SINGULAIR) 10 MG tablet Take 10 mg by mouth at bedtime.    . Multiple Vitamins-Minerals (MACULAR VITAMIN BENEFIT PO) Take 2 tablets by mouth daily.    Marland Kitchen neomycin-polymyxin-hydrocortisone (CORTISPORIN) OTIC  solution Apply 2 drops to the ingrown toenail site twice daily. Cover with band-aid. 10 mL 0  . nitroGLYCERIN (NITROSTAT) 0.4 MG SL tablet Place 1 tablet (0.4 mg total) under the tongue every 5 (five) minutes as needed for chest pain. Reported on 03/24/2016 25 tablet prn  . Omega-3 Fatty Acids (FISH OIL) 1000 MG CAPS Take 1,000 mg by mouth 3 (three) times daily with meals. Reported on 03/24/2016    . omeprazole (PRILOSEC) 20 MG capsule TAKE 1 CAPSULE(20 MG) BY MOUTH TWICE DAILY BEFORE A MEAL 60 capsule 5  .  omeprazole (PRILOSEC) 40 MG capsule Take 1 capsule (40 mg total) by mouth 2 (two) times daily. 60 capsule 3  . Polyethyl Glycol-Propyl Glycol (SYSTANE OP) Apply 1 drop to eye 3 (three) times daily as needed (Dry Eyes).    . polyethylene glycol (MIRALAX) packet Take 17 g by mouth daily. (Patient taking differently: Take 17 g by mouth daily as needed for mild constipation. ) 60 each 1  . potassium chloride SA (K-DUR,KLOR-CON) 20 MEQ tablet Take 1 tablet (20 mEq total) by mouth 2 (two) times daily. (Patient taking differently: Take 20 mEq by mouth 2 (two) times daily as needed (when taking Lasix). ) 40 tablet 11  . triamcinolone cream (KENALOG) 0.1 % Apply 1 application topically daily as needed (for irritation).      No facility-administered medications prior to visit.      Allergies:   Biaxin [clarithromycin] and Penicillins   Social History   Socioeconomic History  . Marital status: Married    Spouse name: Not on file  . Number of children: 3  . Years of education: Not on file  . Highest education level: Not on file  Occupational History  . Not on file  Social Needs  . Financial resource strain: Not on file  . Food insecurity:    Worry: Not on file    Inability: Not on file  . Transportation needs:    Medical: Not on file    Non-medical: Not on file  Tobacco Use  . Smoking status: Former Smoker    Years: 15.00    Last attempt to quit: 11/20/1994    Years since quitting: 24.1  .  Smokeless tobacco: Never Used  . Tobacco comment: Quit in 1996  Substance and Sexual Activity  . Alcohol use: No    Alcohol/week: 0.0 standard drinks  . Drug use: No  . Sexual activity: Yes  Lifestyle  . Physical activity:    Days per week: Not on file    Minutes per session: Not on file  . Stress: Not on file  Relationships  . Social connections:    Talks on phone: Not on file    Gets together: Not on file    Attends religious service: Not on file    Active member of club or organization: Not on file    Attends meetings of clubs or organizations: Not on file    Relationship status: Not on file  Other Topics Concern  . Not on file  Social History Narrative   2 living children, one deceased age 3     Family History:  The patient's family history includes Bladder Cancer in her brother; Cervical cancer in her daughter; Heart attack in her father and mother.   ROS:   Please see the history of present illness.  All other systems are reviewed and otherwise negative.    PHYSICAL EXAM:   VS:  BP 130/80   Pulse 93   Ht 5\' 4"  (1.626 m)   Wt 78.5 kg   SpO2 94%   BMI 29.70 kg/m   BMI: Body mass index is 29.7 kg/m. Affect appropriate Healthy:  appears stated age 25: normal Neck supple with no adenopathy JVP normal no bruits no thyromegaly Lungs clear with no wheezing and good diaphragmatic motion Heart:  S1/S2 no murmur, no rub, gallop or click PMI normal post sternotomy  Abdomen: benighn, BS positve, no tenderness, no AAA no bruit.  No HSM or HJR Distal pulses intact with no bruits No edema Neuro  non-focal Skin warm and dry No muscular weakness   Wt Readings from Last 3 Encounters:  01/06/19 78.5 kg  10/01/18 78.4 kg  02/05/18 75.8 kg      Studies/Labs Reviewed:   EKG:  SR LBBB chronic no acute changes   Recent Labs: No results found for requested labs within last 8760 hours.   Lipid Panel    Component Value Date/Time   CHOL 112 12/19/2007 0910    TRIG 177 (H) 12/19/2007 0910   HDL 30.2 (L) 12/19/2007 0910   CHOLHDL 3.7 CALC 12/19/2007 0910   VLDL 35 12/19/2007 0910   LDLCALC 46 12/19/2007 0910    Additional studies/ records that were reviewed today include: Summarized above.    ASSESSMENT & PLAN:   1. CHF: echo 01/19/17 EF 45-50%    2. Paroxysmal atrial tach - resolved medical Rx  3. CAD - continue ASA, BB, statin for now. Cath June 2017  as above.patent LIMA 4. NSVT - continue beta blocker Seen by GT given improved EF on medical Rx no need for AICD  5. HTN:  Well controlled.  Continue current medications and low sodium Dash type diet.   6. DM:  Discussed low carb diet.  Target hemoglobin A1c is 6.5 or less.  Continue current medications. 7. Chol:  F/u labs with primary continue statin  8. Pre-operative ok to have foot surgery with Dr March Rummage  9. Ortho spine stimulator working well     [employer]

## 2019-01-03 ENCOUNTER — Encounter: Payer: Self-pay | Admitting: *Deleted

## 2019-01-03 NOTE — Progress Notes (Signed)
Per Dr. March Rummage, I sent a surgical medical clearance request to Dr. [name] Cancel.

## 2019-01-06 ENCOUNTER — Ambulatory Visit: Payer: Medicare Other | Admitting: Cardiovascular Disease

## 2019-01-06 ENCOUNTER — Encounter: Payer: Self-pay | Admitting: Cardiovascular Disease

## 2019-01-06 VITALS — BP 130/80 | HR 93 | Ht 64.0 in | Wt 173.0 lb

## 2019-01-06 DIAGNOSIS — I4892 Unspecified atrial flutter: Secondary | ICD-10-CM

## 2019-01-06 NOTE — Patient Instructions (Signed)
Medication Instructions:  Your physician recommends that you continue on your current medications as directed. Please refer to the Current Medication list given to you today.  If you need a refill on your cardiac medications before your next appointment, please call your pharmacy.   Lab work: NONE  If you have labs (blood work) drawn today and your tests are completely normal, you will receive your results only by: . MyChart Message (if you have MyChart) OR . A paper copy in the mail If you have any lab test that is abnormal or we need to change your treatment, we will call you to review the results.  Testing/Procedures: NONE   Follow-Up: At [facility], you and your health needs are our priority.  As part of our continuing mission to provide you with exceptional heart care, we have created designated Provider Care Teams.  These Care Teams include your primary Cardiologist (physician) and Advanced Practice Providers (APPs -  Physician Assistants and Nurse Practitioners) who all work together to provide you with the care you need, when you need it. You will need a follow up appointment in 1 years.  Please call our office 2 months in advance to schedule this appointment.  You may see No primary care provider on file. or one of the following Advanced Practice Providers on your designated Care Team:   Brittany Strader, PA-C ([place] Office) . Michele Lenze, PA-C ([place] Office)  Any Other Special Instructions Will Be Listed Below (If Applicable). Thank you for choosing [facility]!     

## 2019-01-10 ENCOUNTER — Other Ambulatory Visit: Payer: Self-pay | Admitting: Family Medicine

## 2019-01-10 DIAGNOSIS — Z1231 Encounter for screening mammogram for malignant neoplasm of breast: Secondary | ICD-10-CM

## 2019-01-15 ENCOUNTER — Other Ambulatory Visit: Payer: Self-pay | Admitting: Podiatry

## 2019-01-15 ENCOUNTER — Encounter: Payer: Self-pay | Admitting: Podiatry

## 2019-01-15 ENCOUNTER — Telehealth: Payer: Self-pay | Admitting: Podiatry

## 2019-01-15 DIAGNOSIS — M0630 Rheumatoid nodule, unspecified site: Secondary | ICD-10-CM

## 2019-01-15 MED ORDER — CLINDAMYCIN HCL 150 MG PO CAPS: 150.0000 mg | ORAL_CAPSULE | Freq: Two times a day (BID) | ORAL | 0 refills | Status: DC

## 2019-01-15 MED ORDER — OXYCODONE-ACETAMINOPHEN 10-325 MG PO TABS: 1.0000 | ORAL_TABLET | ORAL | 0 refills | Status: DC | PRN

## 2019-01-15 MED ORDER — ONDANSETRON HCL 4 MG PO TABS: 4.0000 mg | ORAL_TABLET | Freq: Three times a day (TID) | ORAL | 0 refills | Status: DC | PRN

## 2019-01-15 NOTE — Progress Notes (Signed)
Rx sent to pharmacy for outpatient surgery. °

## 2019-01-15 NOTE — Telephone Encounter (Signed)
I informed pt Dr. March Rummage had wanted her to take the percocet only. Pt states understanding and that was what her pain management doctor had instructed too.

## 2019-01-15 NOTE — Telephone Encounter (Signed)
I spoke with [facility] pharmacy staff and informed, that Dr. March Rummage had okayed the percocet.

## 2019-01-15 NOTE — Telephone Encounter (Signed)
Katherine Larson, Pharmacist called in regards to Rx sent over for oxycodone 10-325. Katherine Larson already currently takes hydrocodone 7.5-325 1 up to 4 times a day as needed for pain. I wanted to verify if Dr. March Rummage wants to proceed with Rx. We will not fill Rx until we receive a call back from your office.

## 2019-01-15 NOTE — Telephone Encounter (Signed)
Yes please fill post-op will need increased pain medication.

## 2019-01-16 ENCOUNTER — Telehealth: Payer: Self-pay

## 2019-01-16 NOTE — Telephone Encounter (Signed)
POST OP CALL-    1) General condition stated by the patient: Doing fine  2) Is the pt having pain? Throbbing but manageable   3) Pain score:   4) Has the pt taken Rx'd pain medication, regularly or PRN?   5) Is the pain medication giving relief?Yes  6) Any fever, chills, nausea, or vomiting, shortness of breath or tightness in calf?No  7) Is the bandage clean, dry and intact? Yes  8) Is there excessive tightness, bleeding or drainage coming through the bandage?  9) Did you understand all of the post op instruction sheet given?Yes  10) Any questions or concerns regarding post op care/recovery? No   Confirmed POV appointment with patient

## 2019-01-16 NOTE — Telephone Encounter (Signed)
post

## 2019-01-24 ENCOUNTER — Ambulatory Visit (INDEPENDENT_AMBULATORY_CARE_PROVIDER_SITE_OTHER): Payer: Self-pay | Admitting: Podiatry

## 2019-01-24 VITALS — Temp 98.7°F

## 2019-01-24 DIAGNOSIS — M0630 Rheumatoid nodule, unspecified site: Secondary | ICD-10-CM

## 2019-01-24 DIAGNOSIS — M7989 Other specified soft tissue disorders: Secondary | ICD-10-CM

## 2019-01-25 NOTE — Progress Notes (Signed)
Subjective: SHON MANSOURI is a 73 y.o. is seen today in office s/p LEFT foot removal of soft tissue masses preformed on 01-15-2019 with Dr. March Rummage. Overall she states that she is improving in regards to pain. She has remained in the [place] wedge shoe. Denies any systemic complaints such as fevers, chills, nausea, vomiting. No calf pain, chest pain, shortness of breath.   Objective: General: No acute distress, AAOx3  DP/PT pulses palpable 2/4, CRT < 3 sec to all digits.  Protective sensation intact. Motor function intact.  LEFT foot: Incision is well coapted without any evidence of dehiscence and sutures are intact. There is no surrounding erythema, ascending cellulitis, fluctuance, crepitus, malodor, drainage/purulence. There is mild edema around the surgical site. There is mild pain along the surgical site. Incisions healing well without any signs of infection or dehiscence.  No other areas of tenderness to bilateral lower extremities.  No other open lesions or pre-ulcerative lesions.  No pain with calf compression, swelling, warmth, erythema.   Assessment and Plan:  Status post LEFT foot, doing well with no complications   -Treatment options discussed including all alternatives, risks, and complications -Ice/elevation -Antibiotic ointment and a bandage was applied. Keep the dressing clean, dry, intact. -Continue with offloading shoe -Pain medication as needed. -Monitor for any clinical signs or symptoms of infection and DVT/PE and directed to call the office immediately should any occur or go to the ER. -Follow-up in 1 week with Dr. March Rummage or sooner if any problems arise. In the meantime, encouraged to call the office with any questions, concerns, change in symptoms.   Celesta Gentile, DPM

## 2019-01-27 ENCOUNTER — Other Ambulatory Visit: Payer: Self-pay | Admitting: Internal Medicine

## 2019-01-27 ENCOUNTER — Other Ambulatory Visit: Payer: Self-pay | Admitting: Cardiovascular Disease

## 2019-01-27 ENCOUNTER — Telehealth: Payer: Self-pay | Admitting: Podiatry

## 2019-01-27 NOTE — Telephone Encounter (Signed)
I called pt and she states she had been up on the foot more over the weekend with family, the area is dry now. I told pt that I felt she had been on the foot more than necessary, but often couldn't be helped when family was in. I told pt to decrease activity, rest and keep appt 01/30/2019 1:45pm.

## 2019-01-27 NOTE — Telephone Encounter (Signed)
Pt has surgery on 2.26.20 and had the bandages changed last Friday and states that there seems to be some drainage on the sock she is wearing. Pt said that the sock is dry but can see where there was some drainage. Please give patient a call back.

## 2019-01-30 ENCOUNTER — Encounter: Payer: Self-pay | Admitting: Podiatry

## 2019-01-30 ENCOUNTER — Ambulatory Visit (INDEPENDENT_AMBULATORY_CARE_PROVIDER_SITE_OTHER): Payer: Medicare Other | Admitting: Podiatry

## 2019-01-30 ENCOUNTER — Other Ambulatory Visit: Payer: Self-pay

## 2019-01-30 VITALS — BP 117/62 | HR 92 | Temp 98.0°F | Resp 16

## 2019-01-30 DIAGNOSIS — Z9889 Other specified postprocedural states: Secondary | ICD-10-CM

## 2019-01-30 DIAGNOSIS — M0630 Rheumatoid nodule, unspecified site: Secondary | ICD-10-CM

## 2019-01-30 NOTE — Progress Notes (Signed)
Subjective:  Patient ID: Katherine Larson, female    DOB: Mar 12, 1946,  MRN: [id number]  Chief Complaint  Patient presents with  . Routine Post Op    ov#2 dos 02.26.2020 Excision Painful Masses/Nodules Lt * PT.states,"  doing okay, my foot only hurts when I move it around; 2/10." Tx: sx shoe, elelvation, icing and tylenol -pt denies n/V/F/Ch -w/ slight fever a day after sx    DOS: 01/15/2019 Procedure: Excision of painful nodules left foot  73 y.o. female returns for post-op check. History as above.  Review of Systems: Negative except as noted in the HPI. Denies N/V/F/Ch.  Past Medical History:  Diagnosis Date  . Anemia    anemia of chronic disease +/- IDA followed by Dr. Earlie Server. received iron infusions in the past  . Asthma   . Borderline glaucoma   . CAD (coronary artery disease)    a. s/p CABG 1996. b. low risk nuc 2011. c. LHC 04/2016 due to drop in EF -> occluded native LAD,  widely patent sequential LIMA-D1-LAD.  Marland Kitchen Chronic kidney disease    "mild stage of kidney disease"  . Chronic systolic CHF (congestive heart failure) ([place])   . Complication of anesthesia   . DM2 (diabetes mellitus, type 2) ([place])    not on any medicine for this at this time, 05/2016  . GERD (gastroesophageal reflux disease)   . Gout   . History of hiatal hernia    in 84s  . History of pneumonia    March, 2016  . HLD (hyperlipidemia)   . HTN (hypertension)   . Hyperthyroidism 01/21/2016  . Inappropriate sinus tachycardia   . LBBB (left bundle branch block)    a. Seen in 04/2016  . Myocardial infarction ([place])    1996  . Neuropathy   . NICM (nonischemic cardiomyopathy) ([place])    a. Dx 04/2016 - EF 25-30%, diffuse HK, elevated LVEDP, mild MR, mod LAE.  Marland Kitchen NSVT (nonsustained ventricular tachycardia) ([place])   . PAT (paroxysmal atrial tachycardia) ([place])   . PONV (postoperative nausea and vomiting)    "after just about every surgery I've had"  . Rheumatoid arthritis (HCC)    RA  . Seasonal allergies     Current Outpatient Medications:  .  acetaminophen (TYLENOL) 325 MG tablet, Take 325 mg by mouth every 6 (six) hours as needed for mild pain. , Disp: , Rfl:  .  albuterol (PROVENTIL HFA;VENTOLIN HFA) 108 (90 Base) MCG/ACT inhaler, Take 2 puffs 3 times a day and every 4 hours as needed. (Patient taking differently: Inhale into the lungs. Take 2 puffs 3 times a day and every 4 hours as needed.), Disp: , Rfl:  .  allopurinol (ZYLOPRIM) 300 MG tablet, Take 300 mg by mouth daily., Disp: , Rfl:  .  amitriptyline (ELAVIL) 10 MG tablet, Take 20 mg by mouth at bedtime. , Disp: , Rfl:  .  aspirin EC 81 MG EC tablet, Take 1 tablet (81 mg total) by mouth daily. (Patient taking differently: Take 81 mg by mouth every morning. ), Disp: , Rfl:  .  atorvastatin (LIPITOR) 80 MG tablet, Take 80 mg by mouth daily., Disp: , Rfl:  .  carvedilol (COREG) 12.5 MG tablet, TAKE 1 TABLET BY MOUTH TWO  TIMES DAILY, Disp: 180 tablet, Rfl: 3 .  clindamycin (CLEOCIN) 150 MG capsule, Take 1 capsule (150 mg total) by mouth 2 (two) times daily., Disp: 14 capsule, Rfl: 0 .  fexofenadine (ALLEGRA) 180 MG tablet, Take 180 mg by  mouth daily.  , Disp: , Rfl:  .  fluticasone (FLONASE) 50 MCG/ACT nasal spray, Place 2 sprays into both nostrils daily., Disp: , Rfl:  .  furosemide (LASIX) 20 MG tablet, Take 20 mg by mouth 2 (two) times daily., Disp: , Rfl:  .  gabapentin (NEURONTIN) 300 MG capsule, Take 300 mg by mouth at bedtime. , Disp: , Rfl:  .  HYDROcodone-acetaminophen (NORCO) 7.5-325 MG tablet, Take 1 tablet by mouth every 4 (four) hours as needed for moderate pain., Disp: 50 tablet, Rfl: 0 .  hydroxychloroquine (PLAQUENIL) 200 MG tablet, Take 400 mg by mouth daily., Disp: , Rfl:  .  iron polysaccharides (NIFEREX) 150 MG capsule, Take 150 mg by mouth daily., Disp: , Rfl:  .  Lancets (ONETOUCH DELICA PLUS [id number]) MISC, , Disp: , Rfl:  .  lisinopril (PRINIVIL,ZESTRIL) 5 MG tablet, TAKE 1 TABLET BY MOUTH  DAILY, Disp: 90 tablet, Rfl: 3  .  MAGNESIUM-OXIDE 400 (241.3 Mg) MG tablet, TAKE 1 TABLET BY MOUTH EVERY DAY, Disp: 30 tablet, Rfl: 6 .  metoCLOPramide (REGLAN) 5 MG tablet, Take 10 mg by mouth at bedtime. , Disp: , Rfl:  .  montelukast (SINGULAIR) 10 MG tablet, Take 10 mg by mouth at bedtime., Disp: , Rfl:  .  Multiple Vitamins-Minerals (MACULAR VITAMIN BENEFIT PO), Take 2 tablets by mouth daily., Disp: , Rfl:  .  neomycin-polymyxin-hydrocortisone (CORTISPORIN) OTIC solution, Apply 2 drops to the ingrown toenail site twice daily. Cover with band-aid., Disp: 10 mL, Rfl: 0 .  nitroGLYCERIN (NITROSTAT) 0.4 MG SL tablet, Place 1 tablet (0.4 mg total) under the tongue every 5 (five) minutes as needed for chest pain. Reported on 03/24/2016, Disp: 25 tablet, Rfl: prn .  Omega-3 Fatty Acids (FISH OIL) 1000 MG CAPS, Take 1,000 mg by mouth 3 (three) times daily with meals. Reported on 03/24/2016, Disp: , Rfl:  .  omeprazole (PRILOSEC) 20 MG capsule, TAKE 1 CAPSULE(20 MG) BY MOUTH TWICE DAILY BEFORE A MEAL, Disp: 60 capsule, Rfl: 5 .  omeprazole (PRILOSEC) 40 MG capsule, Take 1 capsule (40 mg total) by mouth 2 (two) times daily., Disp: 60 capsule, Rfl: 3 .  ondansetron (ZOFRAN) 4 MG tablet, Take 1 tablet (4 mg total) by mouth every 8 (eight) hours as needed for nausea or vomiting., Disp: 20 tablet, Rfl: 0 .  oxyCODONE-acetaminophen (PERCOCET) 10-325 MG tablet, Take 1 tablet by mouth every 4 (four) hours as needed for pain., Disp: 20 tablet, Rfl: 0 .  Polyethyl Glycol-Propyl Glycol (SYSTANE OP), Apply 1 drop to eye 3 (three) times daily as needed (Dry Eyes)., Disp: , Rfl:  .  polyethylene glycol (MIRALAX) packet, Take 17 g by mouth daily. (Patient taking differently: Take 17 g by mouth daily as needed for mild constipation. ), Disp: 60 each, Rfl: 1 .  potassium chloride SA (K-DUR,KLOR-CON) 20 MEQ tablet, Take 1 tablet (20 mEq total) by mouth 2 (two) times daily. (Patient taking differently: Take 20 mEq by mouth 2 (two) times daily as needed (when  taking Lasix). ), Disp: 40 tablet, Rfl: 11 .  triamcinolone cream (KENALOG) 0.1 %, Apply 1 application topically daily as needed (for irritation). , Disp: , Rfl:   Social History   Tobacco Use  Smoking Status Former Smoker  . Years: 15.00  . Last attempt to quit: 11/20/1994  . Years since quitting: 24.2  Smokeless Tobacco Never Used  Tobacco Comment   Quit in 1996   Objective:   Vitals:   01/30/19 1351  BP: 117/62  Pulse: 92  Resp: 16  Temp: 98 F (36.7 C)   There is no height or weight on file to calculate BMI. Constitutional Well developed. Well nourished.  Vascular Foot warm and well perfused. Capillary refill normal to all digits.   Neurologic Normal speech. Oriented to person, place, and time. Epicritic sensation to light touch grossly present bilaterally.  Dermatologic Skin healing well without signs of infection. Skin edges well coapted without signs of infection.  Orthopedic: Tenderness to palpation noted about the surgical site.   Radiographs: NOne Assessment:   1. Rheumatoid nodule ([place])   2. Post-operative state    Plan:  Patient was evaluated and treated and all questions answered.  S/p foot surgery left -Progressing as expected post-operatively. -XR: None -WB Status: WBAT in surgical shoe -Sutures: intact. Will remove next week. Allow to shower, apply ointment and band-aid. -Medications: None refilled today. -Foot redressed.  Return in about 1 week (around 02/06/2019).

## 2019-02-06 ENCOUNTER — Ambulatory Visit (INDEPENDENT_AMBULATORY_CARE_PROVIDER_SITE_OTHER): Payer: Self-pay | Admitting: Podiatry

## 2019-02-06 ENCOUNTER — Other Ambulatory Visit: Payer: Self-pay

## 2019-02-06 DIAGNOSIS — M7989 Other specified soft tissue disorders: Secondary | ICD-10-CM

## 2019-02-06 DIAGNOSIS — M0630 Rheumatoid nodule, unspecified site: Secondary | ICD-10-CM

## 2019-02-06 DIAGNOSIS — Z9889 Other specified postprocedural states: Secondary | ICD-10-CM

## 2019-02-06 NOTE — Progress Notes (Signed)
She presents today date of surgery 01/15/2019 with excision of soft tissue mass or nodule to the fifth metatarsal head area of the left foot.  She states that her foot does not hurt she denies fever chills nausea vomiting muscle aches pains calf pain back pain chest pain shortness of breath.  Objective: Vital signs are stable alert and oriented x3.  Pulses are palpable.  Sutures are intact margins are well coapted there is no signs of infection.  Sutures removed today margins remain well coapted we will place her in a regular Darco shoe.  Assessment: Well-healing surgical foot.  Plan: Sutures removed today redressed lightly placed in a regular Darco shoe follow-up with Dr. March Rummage in 2 weeks

## 2019-02-14 ENCOUNTER — Other Ambulatory Visit: Payer: Self-pay | Admitting: Family Medicine

## 2019-02-14 DIAGNOSIS — E2839 Other primary ovarian failure: Secondary | ICD-10-CM

## 2019-02-18 ENCOUNTER — Ambulatory Visit: Payer: Medicare Other

## 2019-02-19 ENCOUNTER — Telehealth: Payer: Self-pay | Admitting: *Deleted

## 2019-02-19 NOTE — Telephone Encounter (Signed)
I called pt and informed we were following CDC guidelines and not allowing pts to congregate in the lobby, we were triaging with temperatures and CDC questionnaires, then taking pt to the room. Pt states understanding.

## 2019-02-19 NOTE — Telephone Encounter (Signed)
Pt states she is worried about her appt tomorrow and wanted to know how many people we are allowing in.

## 2019-02-20 ENCOUNTER — Encounter: Payer: Self-pay | Admitting: Podiatry

## 2019-02-20 ENCOUNTER — Other Ambulatory Visit: Payer: Self-pay

## 2019-02-20 ENCOUNTER — Ambulatory Visit (INDEPENDENT_AMBULATORY_CARE_PROVIDER_SITE_OTHER): Payer: Medicare Other | Admitting: Podiatry

## 2019-02-20 VITALS — Temp 97.5°F

## 2019-02-20 DIAGNOSIS — M205X2 Other deformities of toe(s) (acquired), left foot: Secondary | ICD-10-CM | POA: Diagnosis not present

## 2019-02-20 DIAGNOSIS — M0630 Rheumatoid nodule, unspecified site: Secondary | ICD-10-CM

## 2019-02-20 DIAGNOSIS — Z9889 Other specified postprocedural states: Secondary | ICD-10-CM

## 2019-02-20 NOTE — Progress Notes (Addendum)
Subjective:  Patient ID: Katherine Larson, female    DOB: [birth year],  MRN: [id number]  Chief Complaint  Patient presents with  . Routine Post Op    DOS 01/15/2019 Excision of painful masses/nodules Left   "Its feeling okay. My husband just put bandaids over it"   DOS: 01/15/2019 Procedure: Excision of painful nodules left foot  73 y.o. female returns for post-op check. History as above.  Review of Systems: Negative except as noted in the HPI. Denies N/V/F/Ch.  Past Medical History:  Diagnosis Date  . Anemia    anemia of chronic disease +/- IDA followed by Dr. Earlie Server. received iron infusions in the past  . Asthma   . Borderline glaucoma   . CAD (coronary artery disease)    a. s/p CABG 1996. b. low risk nuc 2011. c. LHC 04/2016 due to drop in EF -> occluded native LAD,  widely patent sequential LIMA-D1-LAD.  Marland Kitchen Chronic kidney disease    "mild stage of kidney disease"  . Chronic systolic CHF (congestive heart failure) ([place])   . Complication of anesthesia   . DM2 (diabetes mellitus, type 2) ([place])    not on any medicine for this at this time, 05/2016  . GERD (gastroesophageal reflux disease)   . Gout   . History of hiatal hernia    in 18s  . History of pneumonia    March, 2016  . HLD (hyperlipidemia)   . HTN (hypertension)   . Hyperthyroidism 01/21/2016  . Inappropriate sinus tachycardia   . LBBB (left bundle branch block)    a. Seen in 04/2016  . Myocardial infarction ([place])    1996  . Neuropathy   . NICM (nonischemic cardiomyopathy) ([place])    a. Dx 04/2016 - EF 25-30%, diffuse HK, elevated LVEDP, mild MR, mod LAE.  Marland Kitchen NSVT (nonsustained ventricular tachycardia) ([place])   . PAT (paroxysmal atrial tachycardia) ([place])   . PONV (postoperative nausea and vomiting)    "after just about every surgery I've had"  . Rheumatoid arthritis (HCC)    RA  . Seasonal allergies     Current Outpatient Medications:  .  acetaminophen (TYLENOL) 325 MG tablet, Take 325 mg by mouth every 6 (six)  hours as needed for mild pain. , Disp: , Rfl:  .  albuterol (PROVENTIL HFA;VENTOLIN HFA) 108 (90 Base) MCG/ACT inhaler, Take 2 puffs 3 times a day and every 4 hours as needed. (Patient taking differently: Inhale into the lungs. Take 2 puffs 3 times a day and every 4 hours as needed.), Disp: , Rfl:  .  allopurinol (ZYLOPRIM) 300 MG tablet, Take 300 mg by mouth daily., Disp: , Rfl:  .  amitriptyline (ELAVIL) 10 MG tablet, Take 20 mg by mouth at bedtime. , Disp: , Rfl:  .  aspirin EC 81 MG EC tablet, Take 1 tablet (81 mg total) by mouth daily. (Patient taking differently: Take 81 mg by mouth every morning. ), Disp: , Rfl:  .  atorvastatin (LIPITOR) 80 MG tablet, Take 80 mg by mouth daily., Disp: , Rfl:  .  carvedilol (COREG) 12.5 MG tablet, TAKE 1 TABLET BY MOUTH TWO  TIMES DAILY, Disp: 180 tablet, Rfl: 3 .  fexofenadine (ALLEGRA) 180 MG tablet, Take 180 mg by mouth daily.  , Disp: , Rfl:  .  fluticasone (FLONASE) 50 MCG/ACT nasal spray, Place 2 sprays into both nostrils daily., Disp: , Rfl:  .  furosemide (LASIX) 20 MG tablet, Take 20 mg by mouth 2 (two) times daily., Disp: ,  Rfl:  .  gabapentin (NEURONTIN) 300 MG capsule, Take 300 mg by mouth at bedtime. , Disp: , Rfl:  .  hydroxychloroquine (PLAQUENIL) 200 MG tablet, Take 400 mg by mouth daily., Disp: , Rfl:  .  iron polysaccharides (NIFEREX) 150 MG capsule, Take 150 mg by mouth daily., Disp: , Rfl:  .  Lancets (ONETOUCH DELICA PLUS [id number]) MISC, , Disp: , Rfl:  .  lisinopril (PRINIVIL,ZESTRIL) 5 MG tablet, TAKE 1 TABLET BY MOUTH  DAILY, Disp: 90 tablet, Rfl: 3 .  MAGNESIUM-OXIDE 400 (241.3 Mg) MG tablet, TAKE 1 TABLET BY MOUTH EVERY DAY, Disp: 30 tablet, Rfl: 6 .  metoCLOPramide (REGLAN) 5 MG tablet, Take 10 mg by mouth at bedtime. , Disp: , Rfl:  .  montelukast (SINGULAIR) 10 MG tablet, Take 10 mg by mouth at bedtime., Disp: , Rfl:  .  Multiple Vitamins-Minerals (MACULAR VITAMIN BENEFIT PO), Take 2 tablets by mouth daily., Disp: , Rfl:  .   nitroGLYCERIN (NITROSTAT) 0.4 MG SL tablet, Place 1 tablet (0.4 mg total) under the tongue every 5 (five) minutes as needed for chest pain. Reported on 03/24/2016, Disp: 25 tablet, Rfl: prn .  Omega-3 Fatty Acids (FISH OIL) 1000 MG CAPS, Take 1,000 mg by mouth 3 (three) times daily with meals. Reported on 03/24/2016, Disp: , Rfl:  .  omeprazole (PRILOSEC) 20 MG capsule, TAKE 1 CAPSULE(20 MG) BY MOUTH TWICE DAILY BEFORE A MEAL, Disp: 60 capsule, Rfl: 5 .  omeprazole (PRILOSEC) 40 MG capsule, Take 1 capsule (40 mg total) by mouth 2 (two) times daily., Disp: 60 capsule, Rfl: 3 .  ondansetron (ZOFRAN) 4 MG tablet, Take 1 tablet (4 mg total) by mouth every 8 (eight) hours as needed for nausea or vomiting., Disp: 20 tablet, Rfl: 0 .  oxyCODONE-acetaminophen (PERCOCET) 10-325 MG tablet, Take 1 tablet by mouth every 4 (four) hours as needed for pain., Disp: 20 tablet, Rfl: 0 .  Polyethyl Glycol-Propyl Glycol (SYSTANE OP), Apply 1 drop to eye 3 (three) times daily as needed (Dry Eyes)., Disp: , Rfl:  .  polyethylene glycol (MIRALAX) packet, Take 17 g by mouth daily. (Patient taking differently: Take 17 g by mouth daily as needed for mild constipation. ), Disp: 60 each, Rfl: 1 .  potassium chloride SA (K-DUR,KLOR-CON) 20 MEQ tablet, Take 1 tablet (20 mEq total) by mouth 2 (two) times daily. (Patient taking differently: Take 20 mEq by mouth 2 (two) times daily as needed (when taking Lasix). ), Disp: 40 tablet, Rfl: 11 .  triamcinolone cream (KENALOG) 0.1 %, Apply 1 application topically daily as needed (for irritation). , Disp: , Rfl:   Social History   Tobacco Use  Smoking Status Former Smoker  . Years: 15.00  . Last attempt to quit: 11/20/1994  . Years since quitting: 24.2  Smokeless Tobacco Never Used  Tobacco Comment   Quit in 1996    Allergies  Allergen Reactions  . Biaxin [Clarithromycin] Rash and Other (See Comments)    Blisters in mouth   . Penicillins Rash and Other (See Comments)    Blisters in  mouth Has patient had a PCN reaction causing immediate rash, facial/tongue/throat swelling, SOB or lightheadedness with hypotension: [place]es Has patient had a PCN reaction causing severe rash involving mucus membranes or skin necrosis: Nono Has patient had a PCN reaction that required hospitalization Nono Has patient had a PCN reaction occurring within the last 10 years: Nono If all of the above answers are "NO", then may proceed   Objective:  Vitals:   02/20/19 1354  Temp: (!) 97.5 F (36.4 C)   There is no height or weight on file to calculate BMI. Constitutional Well developed. Well nourished.  Vascular Foot warm and well perfused. Capillary refill normal to all digits.   Neurologic Normal speech. Oriented to person, place, and time. Epicritic sensation to light touch grossly present bilaterally.  Dermatologic Skin healed.  Orthopedic: No tenderness to palpation noted about the surgical site. Slight hallux extensus noted.   Radiographs: None Assessment:   1. Rheumatoid nodule ([place])   2. Post-operative state    Plan:  Patient was evaluated and treated and all questions answered.  S/p foot surgery left -Progressing as expected post-operatively. -XR: None -WB Status: WBAT in normal shoes. Transition slowly -Sutures: out -Medications: None refilled today.  Hallux extensus -Hallux control strapping dispensed and applied.  Return in about 4 weeks (around 03/20/2019) for Post-op.

## 2019-03-13 ENCOUNTER — Other Ambulatory Visit: Payer: Self-pay | Admitting: Cardiovascular Disease

## 2019-03-18 ENCOUNTER — Ambulatory Visit: Payer: Medicare Other

## 2019-03-20 ENCOUNTER — Other Ambulatory Visit: Payer: Self-pay

## 2019-03-20 ENCOUNTER — Ambulatory Visit (INDEPENDENT_AMBULATORY_CARE_PROVIDER_SITE_OTHER): Payer: Medicare Other | Admitting: Podiatry

## 2019-03-20 VITALS — Temp 97.9°F

## 2019-03-20 DIAGNOSIS — Z9889 Other specified postprocedural states: Secondary | ICD-10-CM

## 2019-03-20 NOTE — Progress Notes (Signed)
Subjective:  Patient ID: Katherine Larson, female    DOB: [birth year],  MRN: [id number]  Chief Complaint  Patient presents with  . Routine Post Op    DOS 01/15/2019 Excision of painful masses/nodules Left foot: " my foot feels fine, it does get a little sore at times and is a little red"    DOS: 01/15/2019 Procedure: Excision of painful nodules left foot  73 y.o. female returns for post-op check. History as above.  Review of Systems: Negative except as noted in the HPI. Denies N/V/F/Ch.  Past Medical History:  Diagnosis Date  . Anemia    anemia of chronic disease +/- IDA followed by Dr. Earlie Server. received iron infusions in the past  . Asthma   . Borderline glaucoma   . CAD (coronary artery disease)    a. s/p CABG 1996. b. low risk nuc 2011. c. LHC 04/2016 due to drop in EF -> occluded native LAD,  widely patent sequential LIMA-D1-LAD.  Marland Kitchen Chronic kidney disease    "mild stage of kidney disease"  . Chronic systolic CHF (congestive heart failure) ([place])   . Complication of anesthesia   . DM2 (diabetes mellitus, type 2) ([place])    not on any medicine for this at this time, 05/2016  . GERD (gastroesophageal reflux disease)   . Gout   . History of hiatal hernia    in 58s  . History of pneumonia    March, 2016  . HLD (hyperlipidemia)   . HTN (hypertension)   . Hyperthyroidism 01/21/2016  . Inappropriate sinus tachycardia   . LBBB (left bundle branch block)    a. Seen in 04/2016  . Myocardial infarction ([place])    1996  . Neuropathy   . NICM (nonischemic cardiomyopathy) ([place])    a. Dx 04/2016 - EF 25-30%, diffuse HK, elevated LVEDP, mild MR, mod LAE.  Marland Kitchen NSVT (nonsustained ventricular tachycardia) ([place])   . PAT (paroxysmal atrial tachycardia) ([place])   . PONV (postoperative nausea and vomiting)    "after just about every surgery I've had"  . Rheumatoid arthritis (HCC)    RA  . Seasonal allergies     Current Outpatient Medications:  .  acetaminophen (TYLENOL) 325 MG tablet, Take 325 mg  by mouth every 6 (six) hours as needed for mild pain. , Disp: , Rfl:  .  albuterol (PROVENTIL HFA;VENTOLIN HFA) 108 (90 Base) MCG/ACT inhaler, Take 2 puffs 3 times a day and every 4 hours as needed. (Patient taking differently: Inhale into the lungs. Take 2 puffs 3 times a day and every 4 hours as needed.), Disp: , Rfl:  .  allopurinol (ZYLOPRIM) 300 MG tablet, Take 300 mg by mouth daily., Disp: , Rfl:  .  amitriptyline (ELAVIL) 10 MG tablet, Take 20 mg by mouth at bedtime. , Disp: , Rfl:  .  aspirin EC 81 MG EC tablet, Take 1 tablet (81 mg total) by mouth daily. (Patient taking differently: Take 81 mg by mouth every morning. ), Disp: , Rfl:  .  atorvastatin (LIPITOR) 80 MG tablet, Take 80 mg by mouth daily., Disp: , Rfl:  .  carvedilol (COREG) 12.5 MG tablet, TAKE 1 TABLET BY MOUTH TWO  TIMES DAILY, Disp: 180 tablet, Rfl: 3 .  fexofenadine (ALLEGRA) 180 MG tablet, Take 180 mg by mouth daily.  , Disp: , Rfl:  .  fluticasone (FLONASE) 50 MCG/ACT nasal spray, Place 2 sprays into both nostrils daily., Disp: , Rfl:  .  furosemide (LASIX) 20 MG tablet, Take 20  mg by mouth 2 (two) times daily., Disp: , Rfl:  .  gabapentin (NEURONTIN) 300 MG capsule, Take 300 mg by mouth at bedtime. , Disp: , Rfl:  .  hydroxychloroquine (PLAQUENIL) 200 MG tablet, Take 400 mg by mouth daily., Disp: , Rfl:  .  iron polysaccharides (NIFEREX) 150 MG capsule, Take 150 mg by mouth daily., Disp: , Rfl:  .  Lancets (ONETOUCH DELICA PLUS [id number]) MISC, , Disp: , Rfl:  .  lisinopril (PRINIVIL,ZESTRIL) 5 MG tablet, TAKE 1 TABLET BY MOUTH  DAILY, Disp: 90 tablet, Rfl: 3 .  MAGNESIUM-OXIDE 400 (241.3 Mg) MG tablet, TAKE 1 TABLET BY MOUTH EVERY DAY, Disp: 30 tablet, Rfl: 6 .  metoCLOPramide (REGLAN) 5 MG tablet, Take 10 mg by mouth at bedtime. , Disp: , Rfl:  .  montelukast (SINGULAIR) 10 MG tablet, Take 10 mg by mouth at bedtime., Disp: , Rfl:  .  Multiple Vitamins-Minerals (MACULAR VITAMIN BENEFIT PO), Take 2 tablets by mouth  daily., Disp: , Rfl:  .  nitroGLYCERIN (NITROSTAT) 0.4 MG SL tablet, Place 1 tablet (0.4 mg total) under the tongue every 5 (five) minutes as needed for chest pain. Reported on 03/24/2016, Disp: 25 tablet, Rfl: prn .  Omega-3 Fatty Acids (FISH OIL) 1000 MG CAPS, Take 1,000 mg by mouth 3 (three) times daily with meals. Reported on 03/24/2016, Disp: , Rfl:  .  omeprazole (PRILOSEC) 20 MG capsule, TAKE 1 CAPSULE(20 MG) BY MOUTH TWICE DAILY BEFORE A MEAL, Disp: 60 capsule, Rfl: 5 .  omeprazole (PRILOSEC) 40 MG capsule, Take 1 capsule (40 mg total) by mouth 2 (two) times daily., Disp: 60 capsule, Rfl: 3 .  ondansetron (ZOFRAN) 4 MG tablet, Take 1 tablet (4 mg total) by mouth every 8 (eight) hours as needed for nausea or vomiting., Disp: 20 tablet, Rfl: 0 .  oxyCODONE-acetaminophen (PERCOCET) 10-325 MG tablet, Take 1 tablet by mouth every 4 (four) hours as needed for pain., Disp: 20 tablet, Rfl: 0 .  Polyethyl Glycol-Propyl Glycol (SYSTANE OP), Apply 1 drop to eye 3 (three) times daily as needed (Dry Eyes)., Disp: , Rfl:  .  polyethylene glycol (MIRALAX) packet, Take 17 g by mouth daily. (Patient taking differently: Take 17 g by mouth daily as needed for mild constipation. ), Disp: 60 each, Rfl: 1 .  potassium chloride SA (K-DUR,KLOR-CON) 20 MEQ tablet, Take 1 tablet (20 mEq total) by mouth 2 (two) times daily. (Patient taking differently: Take 20 mEq by mouth 2 (two) times daily as needed (when taking Lasix). ), Disp: 40 tablet, Rfl: 11 .  triamcinolone cream (KENALOG) 0.1 %, Apply 1 application topically daily as needed (for irritation). , Disp: , Rfl:   Social History   Tobacco Use  Smoking Status Former Smoker  . Years: 15.00  . Last attempt to quit: 11/20/1994  . Years since quitting: 24.3  Smokeless Tobacco Never Used  Tobacco Comment   Quit in 1996    Allergies  Allergen Reactions  . Biaxin [Clarithromycin] Rash and Other (See Comments)    Blisters in mouth   . Penicillins Rash and Other (See  Comments)    Blisters in mouth Has patient had a PCN reaction causing immediate rash, facial/tongue/throat swelling, SOB or lightheadedness with hypotension: [place]es Has patient had a PCN reaction causing severe rash involving mucus membranes or skin necrosis: Nono Has patient had a PCN reaction that required hospitalization Nono Has patient had a PCN reaction occurring within the last 10 years: Nono If all of the above answers  are "NO", then may proceed   Objective:   Vitals:   03/20/19 1338  Temp: 97.9 F (36.6 C)   There is no height or weight on file to calculate BMI. Constitutional Well developed. Well nourished.  Vascular Foot warm and well perfused. Capillary refill normal to all digits.   Neurologic Normal speech. Oriented to person, place, and time. Epicritic sensation to light touch grossly present bilaterally.  Dermatologic Skin healed.  Orthopedic: No tenderness to palpation noted about the surgical site. Slight hallux extensus noted.   Radiographs: None Assessment:   1. Post-operative state    Plan:  Patient was evaluated and treated and all questions answered.  S/p foot surgery left -Doing well all surgical wounds healed still having some occasional pain and tenderness.  Discussed transition to full-time use of her normal shoe gear.  Continue use of hallux control strap for slight hallux extensus deformity  Return in about 2 months (around 05/20/2019) for Post-op.

## 2019-04-23 ENCOUNTER — Encounter: Payer: Self-pay | Admitting: Internal Medicine

## 2019-05-05 ENCOUNTER — Ambulatory Visit: Payer: Medicare Other | Admitting: Physician Assistant

## 2019-05-08 ENCOUNTER — Ambulatory Visit
Admission: RE | Admit: 2019-05-08 | Discharge: 2019-05-08 | Disposition: A | Discharge status: Home / Self Care | Payer: Medicare Other | Source: Ambulatory Visit | Attending: Family Medicine | Admitting: Family Medicine

## 2019-05-08 ENCOUNTER — Other Ambulatory Visit: Payer: Self-pay

## 2019-05-08 DIAGNOSIS — Z1231 Encounter for screening mammogram for malignant neoplasm of breast: Secondary | ICD-10-CM

## 2019-05-15 ENCOUNTER — Other Ambulatory Visit: Payer: Self-pay

## 2019-05-15 ENCOUNTER — Ambulatory Visit (INDEPENDENT_AMBULATORY_CARE_PROVIDER_SITE_OTHER): Payer: Medicare Other | Admitting: Podiatry

## 2019-05-15 VITALS — Temp 97.7°F

## 2019-05-15 DIAGNOSIS — M5432 Sciatica, left side: Secondary | ICD-10-CM | POA: Diagnosis not present

## 2019-05-15 DIAGNOSIS — M7672 Peroneal tendinitis, left leg: Secondary | ICD-10-CM

## 2019-05-15 NOTE — Progress Notes (Signed)
Subjective:  Patient ID: Katherine Larson, female    DOB: [birth year],  MRN: [id number]  Chief Complaint  Patient presents with  . Ankle Pain    Pt states having pain in lateral aspect of left ankle after wearing cam boot. Pt states pain travels up leg and is present constantly when walking.    73 y.o. female presents with the above complaint. Hx as above. States the foot is doing well but having pain outside the ankle.  Review of Systems: Negative except as noted in the HPI. Denies N/V/F/Ch.  Past Medical History:  Diagnosis Date  . Anemia    anemia of chronic disease +/- IDA followed by Dr. Earlie Server. received iron infusions in the past  . Asthma   . Borderline glaucoma   . CAD (coronary artery disease)    a. s/p CABG 1996. b. low risk nuc 2011. c. LHC 04/2016 due to drop in EF -> occluded native LAD,  widely patent sequential LIMA-D1-LAD.  Marland Kitchen Chronic kidney disease    "mild stage of kidney disease"  . Chronic systolic CHF (congestive heart failure) ([place])   . Complication of anesthesia   . DM2 (diabetes mellitus, type 2) ([place])    not on any medicine for this at this time, 05/2016  . GERD (gastroesophageal reflux disease)   . Gout   . History of hiatal hernia    in 67s  . History of pneumonia    March, 2016  . HLD (hyperlipidemia)   . HTN (hypertension)   . Hyperthyroidism 01/21/2016  . Inappropriate sinus tachycardia   . LBBB (left bundle branch block)    a. Seen in 04/2016  . Myocardial infarction ([place])    1996  . Neuropathy   . NICM (nonischemic cardiomyopathy) ([place])    a. Dx 04/2016 - EF 25-30%, diffuse HK, elevated LVEDP, mild MR, mod LAE.  Marland Kitchen NSVT (nonsustained ventricular tachycardia) ([place])   . PAT (paroxysmal atrial tachycardia) ([place])   . PONV (postoperative nausea and vomiting)    "after just about every surgery I've had"  . Rheumatoid arthritis ([facility])    RA  . Seasonal allergies     Current Outpatient Medications:  .  acetaminophen (TYLENOL) 325 MG tablet,  Take 325 mg by mouth every 6 (six) hours as needed for mild pain. , Disp: , Rfl:  .  albuterol (PROVENTIL HFA;VENTOLIN HFA) 108 (90 Base) MCG/ACT inhaler, Take 2 puffs 3 times a day and every 4 hours as needed. (Patient taking differently: Inhale into the lungs. Take 2 puffs 3 times a day and every 4 hours as needed.), Disp: , Rfl:  .  allopurinol (ZYLOPRIM) 300 MG tablet, Take 300 mg by mouth daily., Disp: , Rfl:  .  amitriptyline (ELAVIL) 10 MG tablet, Take 20 mg by mouth at bedtime. , Disp: , Rfl:  .  aspirin EC 81 MG EC tablet, Take 1 tablet (81 mg total) by mouth daily. (Patient taking differently: Take 81 mg by mouth every morning. ), Disp: , Rfl:  .  atorvastatin (LIPITOR) 80 MG tablet, Take 80 mg by mouth daily., Disp: , Rfl:  .  carvedilol (COREG) 12.5 MG tablet, TAKE 1 TABLET BY MOUTH TWO  TIMES DAILY, Disp: 180 tablet, Rfl: 3 .  fexofenadine (ALLEGRA) 180 MG tablet, Take 180 mg by mouth daily.  , Disp: , Rfl:  .  fluticasone (FLONASE) 50 MCG/ACT nasal spray, Place 2 sprays into both nostrils daily., Disp: , Rfl:  .  furosemide (LASIX) 20 MG tablet,  Take 20 mg by mouth 2 (two) times daily., Disp: , Rfl:  .  gabapentin (NEURONTIN) 300 MG capsule, Take 300 mg by mouth at bedtime. , Disp: , Rfl:  .  hydroxychloroquine (PLAQUENIL) 200 MG tablet, Take 400 mg by mouth daily., Disp: , Rfl:  .  iron polysaccharides (NIFEREX) 150 MG capsule, Take 150 mg by mouth daily., Disp: , Rfl:  .  Lancets (ONETOUCH DELICA PLUS [id number]) MISC, , Disp: , Rfl:  .  leflunomide (ARAVA) 20 MG tablet, , Disp: , Rfl:  .  lisinopril (PRINIVIL,ZESTRIL) 5 MG tablet, TAKE 1 TABLET BY MOUTH  DAILY, Disp: 90 tablet, Rfl: 3 .  MAGNESIUM-OXIDE 400 (241.3 Mg) MG tablet, TAKE 1 TABLET BY MOUTH EVERY DAY, Disp: 30 tablet, Rfl: 6 .  metoCLOPramide (REGLAN) 5 MG tablet, Take 10 mg by mouth at bedtime. , Disp: , Rfl:  .  montelukast (SINGULAIR) 10 MG tablet, Take 10 mg by mouth at bedtime., Disp: , Rfl:  .  Multiple  Vitamins-Minerals (MACULAR VITAMIN BENEFIT PO), Take 2 tablets by mouth daily., Disp: , Rfl:  .  nitroGLYCERIN (NITROSTAT) 0.4 MG SL tablet, Place 1 tablet (0.4 mg total) under the tongue every 5 (five) minutes as needed for chest pain. Reported on 03/24/2016, Disp: 25 tablet, Rfl: prn .  Omega-3 Fatty Acids (FISH OIL) 1000 MG CAPS, Take 1,000 mg by mouth 3 (three) times daily with meals. Reported on 03/24/2016, Disp: , Rfl:  .  omeprazole (PRILOSEC) 20 MG capsule, TAKE 1 CAPSULE(20 MG) BY MOUTH TWICE DAILY BEFORE A MEAL, Disp: 60 capsule, Rfl: 5 .  omeprazole (PRILOSEC) 40 MG capsule, Take 1 capsule (40 mg total) by mouth 2 (two) times daily., Disp: 60 capsule, Rfl: 3 .  ondansetron (ZOFRAN) 4 MG tablet, Take 1 tablet (4 mg total) by mouth every 8 (eight) hours as needed for nausea or vomiting., Disp: 20 tablet, Rfl: 0 .  oxyCODONE-acetaminophen (PERCOCET) 10-325 MG tablet, Take 1 tablet by mouth every 4 (four) hours as needed for pain., Disp: 20 tablet, Rfl: 0 .  Polyethyl Glycol-Propyl Glycol (SYSTANE OP), Apply 1 drop to eye 3 (three) times daily as needed ([place])., Disp: , Rfl:  .  polyethylene glycol (MIRALAX) packet, Take 17 g by mouth daily. (Patient taking differently: Take 17 g by mouth daily as needed for mild constipation. ), Disp: 60 each, Rfl: 1 .  potassium chloride SA (K-DUR,KLOR-CON) 20 MEQ tablet, Take 1 tablet (20 mEq total) by mouth 2 (two) times daily. (Patient taking differently: Take 20 mEq by mouth 2 (two) times daily as needed (when taking Lasix). ), Disp: 40 tablet, Rfl: 11 .  triamcinolone cream (KENALOG) 0.1 %, Apply 1 application topically daily as needed (for irritation). , Disp: , Rfl:   Social History   Tobacco Use  Smoking Status Former Smoker  . Years: 15.00  . Quit date: 11/20/1994  . Years since quitting: 24.4  Smokeless Tobacco Never Used  Tobacco Comment   Quit in 1996    Objective:   Vitals:   05/15/19 1334  Temp: 97.7 F (36.5 C)   There is no  height or weight on file to calculate BMI. Constitutional Well developed. Well nourished.  Vascular Dorsalis pedis pulses palpable bilaterally. Posterior tibial pulses palpable bilaterally. Capillary refill normal to all digits.  No cyanosis or clubbing noted. Pedal hair growth normal.  Neurologic Normal speech. Oriented to person, place, and time. Epicritic sensation to light touch grossly present bilaterally.  Dermatologic Nails well groomed and  normal in appearance. No open wounds. No skin lesions.  Orthopedic: POP Left ATFL and along the peroneal tendons. No pain at Left submet 5 and submet 1 at area of excision of the nodules.   Radiographs: None today. Assessment:   1. Peroneal tendonitis of left lower extremity   2. Sciatica of left side    Plan:  Patient was evaluated and treated and all questions answered.  Peroneal Tendonitis Left, possible Sciatica -Educated on above -Trial triliock brace x6 weeks -F/u in 4 weeks for recheck -Should pain persist would consider PCP f/u for discussion about sciatica. Has a hx of low back issues and the boot could have aggravated this.  Return in about 6 weeks (around 06/26/2019).

## 2019-05-15 NOTE — Patient Instructions (Signed)
Sciatica    Sciatica is pain, numbness, weakness, or tingling along your sciatic nerve. The sciatic nerve starts in the lower back and goes down the back of each leg. Sciatica happens when this nerve is pinched or has pressure put on it. Sciatica usually goes away on its own or with treatment. Sometimes, sciatica may keep coming back (recur).  Follow these instructions at home:  Medicines  · Take over-the-counter and prescription medicines only as told by your doctor.  · Do not drive or use heavy machinery while taking prescription pain medicine.  Managing pain  · If directed, put ice on the affected area.  ? Put ice in a plastic bag.  ? Place a towel between your skin and the bag.  ? Leave the ice on for 20 minutes, 2-3 times a day.  · After icing, apply heat to the affected area before you exercise or as often as told by your doctor. Use the heat source that your doctor tells you to use, such as a moist heat pack or a heating pad.  ? Place a towel between your skin and the heat source.  ? Leave the heat on for 20-30 minutes.  ? Remove the heat if your skin turns bright red. This is especially important if you are unable to feel pain, heat, or cold. You may have a greater risk of getting burned.  Activity  · Return to your normal activities as told by your doctor. Ask your doctor what activities are safe for you.  ? Avoid activities that make your sciatica worse.  · Take short rests during the day. Rest in a lying or standing position. This is usually better than sitting to rest.  ? When you rest for a long time, do some physical activity or stretching between periods of rest.  ? Avoid sitting for a long time without moving. Get up and move around at least one time each hour.  · Exercise and stretch regularly, as told by your doctor.  · Do not lift anything that is heavier than 10 lb (4.5 kg) while you have symptoms of sciatica.  ? Avoid lifting heavy things even when you do not have symptoms.  ? Avoid lifting  heavy things over and over.  · When you lift objects, always lift in a way that is safe for your body. To do this, you should:  ? Bend your knees.  ? Keep the object close to your body.  ? Avoid twisting.  General instructions  · Use good posture.  ? Avoid leaning forward when you are sitting.  ? Avoid hunching over when you are standing.  · Stay at a healthy weight.  · Wear comfortable shoes that support your feet. Avoid wearing high heels.  · Avoid sleeping on a mattress that is too soft or too hard. You might have less pain if you sleep on a mattress that is firm enough to support your back.  · Keep all follow-up visits as told by your doctor. This is important.  Contact a doctor if:  · You have pain that:  ? Wakes you up when you are sleeping.  ? Gets worse when you lie down.  ? Is worse than the pain you have had in the past.  ? Lasts longer than 4 weeks.  · You lose weight for without trying.  Get help right away if:  · You cannot control when you pee (urinate) or poop (have a bowel movement).  · You   have weakness in any of these areas and it gets worse.  ? Lower back.  ? Lower belly (pelvis).  ? Butt (buttocks).  ? Legs.  · You have redness or swelling of your back.  · You have a burning feeling when you pee.  This information is not intended to replace advice given to you by your health care provider. Make sure you discuss any questions you have with your health care provider.  Document Released: 08/15/2008 Document Revised: 04/13/2016 Document Reviewed: 07/16/2015  [employer] Interactive Patient Education © 2019 [employer]

## 2019-05-27 ENCOUNTER — Other Ambulatory Visit: Payer: Self-pay | Admitting: Neurosurgery

## 2019-05-27 ENCOUNTER — Other Ambulatory Visit (HOSPITAL_COMMUNITY): Payer: Self-pay | Admitting: Neurosurgery

## 2019-05-27 DIAGNOSIS — M47816 Spondylosis without myelopathy or radiculopathy, lumbar region: Secondary | ICD-10-CM

## 2019-06-03 ENCOUNTER — Ambulatory Visit
Admission: RE | Admit: 2019-06-03 | Discharge: 2019-06-03 | Disposition: A | Discharge status: Home / Self Care | Payer: Medicare Other | Source: Ambulatory Visit | Attending: Family Medicine | Admitting: Family Medicine

## 2019-06-03 ENCOUNTER — Other Ambulatory Visit: Payer: Self-pay

## 2019-06-03 DIAGNOSIS — E2839 Other primary ovarian failure: Secondary | ICD-10-CM

## 2019-06-05 ENCOUNTER — Ambulatory Visit: Payer: Medicare Other | Admitting: Gastroenterology

## 2019-06-05 ENCOUNTER — Other Ambulatory Visit: Payer: Self-pay

## 2019-06-05 ENCOUNTER — Encounter: Payer: Self-pay | Admitting: Gastroenterology

## 2019-06-05 VITALS — BP 131/74 | HR 84 | Temp 98.4°F | Ht 62.0 in | Wt 172.2 lb

## 2019-06-05 DIAGNOSIS — R1310 Dysphagia, unspecified: Secondary | ICD-10-CM | POA: Diagnosis not present

## 2019-06-05 DIAGNOSIS — R1319 Other dysphagia: Secondary | ICD-10-CM

## 2019-06-05 DIAGNOSIS — K219 Gastro-esophageal reflux disease without esophagitis: Secondary | ICD-10-CM

## 2019-06-05 MED ORDER — OMEPRAZOLE 40 MG PO CPDR: 40.0000 mg | DELAYED_RELEASE_CAPSULE | Freq: Two times a day (BID) | ORAL | 2 refills | Status: DC

## 2019-06-05 NOTE — Progress Notes (Signed)
Primary Care Physician:  Harlan Stains, MD  Primary Gastroenterologist:  Garfield Cornea, MD   Chief Complaint  Patient presents with  . Heartburn    Food feels like it is getting stuck,burning sensation in chest    HPI:  Katherine Larson is a 73 y.o. female here for couple of months solid food and pill dysphagia. Pain in lower chest when it happens. Also feels like something in throat when she takes pills.  Symptoms occurring for several months.  Previously had her esophagus dilated with good results.  Continues to have daily heartburn, worse with certain foods.  Currently on omeprazole 20 mg twice daily.  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  When she drinks coffee in the morning she will have regurgitation.  She denies any abdominal pain.  Bowel movements regular.  No melena or rectal bleeding.  EGD in February 2017 with chronic inactive gastritis, normal esophagus status post dilation, duodenal diverticulum.  Work-up for IDA by Dr. Watt Climes. Colonoscopy in 2013, external/internal hemorrhoids, diverticulosis, hyperplastic polyps. Givens capsule 2014 for IDA with minimal gastritis.  EGD in 2013, one nonbleeding superficial gastric ulcer, medium size diverticulum in the area of the papilloma, hiatal hernia.  Celiac serologies negative in November 2019.  Current Outpatient Medications  Medication Sig Dispense Refill  . acetaminophen (TYLENOL) 325 MG tablet Take 325 mg by mouth every 6 (six) hours as needed for mild pain.     Marland Kitchen albuterol (PROVENTIL HFA;VENTOLIN HFA) 108 (90 Base) MCG/ACT inhaler Take 2 puffs 3 times a day and every 4 hours as needed. (Patient taking differently: Inhale into the lungs. Take 2 puffs 3 times a day and every 4 hours as needed.)    . allopurinol (ZYLOPRIM) 300 MG tablet Take 300 mg by mouth daily.    Marland Kitchen aspirin EC 81 MG EC tablet Take 1 tablet (81 mg total) by mouth daily. (Patient taking differently: Take 81 mg by mouth  every morning. )    . atorvastatin (LIPITOR) 80 MG tablet Take 80 mg by mouth daily.    . carvedilol (COREG) 12.5 MG tablet TAKE 1 TABLET BY MOUTH TWO  TIMES DAILY 180 tablet 3  . fexofenadine (ALLEGRA) 180 MG tablet Take 180 mg by mouth daily.      . fluticasone (FLONASE) 50 MCG/ACT nasal spray Place 2 sprays into both nostrils daily.    . furosemide (LASIX) 20 MG tablet Take 40 mg by mouth 2 (two) times daily.     Marland Kitchen gabapentin (NEURONTIN) 300 MG capsule Take 300 mg by mouth 3 (three) times daily.     Marland Kitchen HYDROcodone-acetaminophen (NORCO) 7.5-325 MG tablet Take 1 tablet by mouth every 6 (six) hours as needed for moderate pain.    . Lancets (ONETOUCH DELICA PLUS [id number]) [place]     . leflunomide (ARAVA) 20 MG tablet     . lisinopril (PRINIVIL,ZESTRIL) 5 MG tablet TAKE 1 TABLET BY MOUTH  DAILY 90 tablet 3  . MAGNESIUM-OXIDE 400 (241.3 Mg) MG tablet TAKE 1 TABLET BY MOUTH EVERY DAY 30 tablet 6  . metoCLOPramide (REGLAN) 5 MG tablet Take 10 mg by mouth at bedtime.     . montelukast (SINGULAIR) 10 MG tablet Take 10 mg by mouth at bedtime.    . nitroGLYCERIN (NITROSTAT) 0.4 MG SL tablet Place 1 tablet (0.4 mg total) under the tongue every 5 (five) minutes as needed for chest pain. Reported on 03/24/2016 25 tablet prn  . omeprazole (PRILOSEC) 20  MG capsule TAKE 1 CAPSULE(20 MG) BY MOUTH TWICE DAILY BEFORE A MEAL 60 capsule 5  . Polyethyl Glycol-Propyl Glycol (SYSTANE OP) Apply 1 drop to eye 3 (three) times daily as needed (Dry Eyes).    . potassium chloride SA (K-DUR,KLOR-CON) 20 MEQ tablet Take 1 tablet (20 mEq total) by mouth 2 (two) times daily. (Patient taking differently: Take 20 mEq by mouth 2 (two) times daily as needed (when taking Lasix). ) 40 tablet 11  . triamcinolone cream (KENALOG) 0.1 % Apply 1 application topically daily as needed (for irritation).     Marland Kitchen UNABLE TO FIND 2 tablets daily. Macular Degeneration Supplement    . Multiple Vitamins-Minerals (MACULAR VITAMIN BENEFIT PO) Take 2 tablets  by mouth daily.     No current facility-administered medications for this visit.     Allergies as of 06/05/2019 - Review Complete 06/05/2019  Allergen Reaction Noted  . Biaxin [clarithromycin] Rash and Other (See Comments) 02/02/2010  . Penicillins Rash and Other (See Comments) 02/02/2010    Past Medical History:  Diagnosis Date  . Anemia    anemia of chronic disease +/- IDA followed by Dr. Earlie Server. received iron infusions in the past  . Asthma   . Borderline glaucoma   . CAD (coronary artery disease)    a. s/p CABG 1996. b. low risk nuc 2011. c. LHC 04/2016 due to drop in EF -> occluded native LAD,  widely patent sequential LIMA-D1-LAD.  Marland Kitchen Chronic kidney disease    "mild stage of kidney disease"  . Chronic systolic CHF (congestive heart failure) ([place])   . Complication of anesthesia   . DM2 (diabetes mellitus, type 2) ([facility])    not on any medicine for this at this time, 05/2016  . GERD (gastroesophageal reflux disease)   . Gout   . History of hiatal hernia    in 89s  . History of pneumonia    March, 2016  . HLD (hyperlipidemia)   . HTN (hypertension)   . Hyperthyroidism 01/21/2016  . Inappropriate sinus tachycardia   . LBBB (left bundle branch block)    a. Seen in 04/2016  . Myocardial infarction ([place])    1996  . Neuropathy   . NICM (nonischemic cardiomyopathy) ([place])    a. Dx 04/2016 - EF 25-30%, diffuse HK, elevated LVEDP, mild MR, mod LAE.  Marland Kitchen NSVT (nonsustained ventricular tachycardia) ([place])   . PAT (paroxysmal atrial tachycardia) ([place])   . PONV (postoperative nausea and vomiting)    "after just about every surgery I've had"  . Rheumatoid arthritis ([facility])    RA  . Seasonal allergies     Past Surgical History:  Procedure Laterality Date  . ABDOMINAL HYSTERECTOMY    . APPENDECTOMY    . BACK SURGERY    . CARDIAC CATHETERIZATION N/A 05/01/2016   Procedure: Right/Left Heart Cath and Coronary/Graft Angiography;  Surgeon: Leonie Man, MD;  Location: [facility];   Service: Cardiovascular;  Laterality: N/A;  . CHOLECYSTECTOMY    . COLONOSCOPY  11/2011   Dr. Magod:ext/int hemorrhoids, diverticulosis sigmoid colon and distal desc colon, mid-desc colon hyperplastic polyps.   . CORONARY ARTERY BYPASS GRAFT  1996   2 vessels  . ESOPHAGOGASTRODUODENOSCOPY  11/2011   Dr. Watt Climes: small hiatal hernia, one non-bleeding superficial gastric ulcer, medium-sized diverticulum in area of papilla  . ESOPHAGOGASTRODUODENOSCOPY N/A 12/29/2015   Dr. Gala Romney: Chronic inactive gastritis, normal esophagus status post dilation, duodenal diverticula  . EYE SURGERY Bilateral   . FRACTURE SURGERY Right  arm  . GIVENS CAPSULE STUDY  11/2012   Dr. Watt Climes: minimal gastritis, normal small bowel capsule  . HERNIA REPAIR     hiatal hernia  . MAXIMUM ACCESS (MAS)POSTERIOR LUMBAR INTERBODY FUSION (PLIF) 1 LEVEL N/A 08/14/2013   Procedure:  MAXIMUM ACCESS SURGERY(MAS) POSTERIOR LUMBAR INTERBODY FUSION LUMBAR THREE-FOUR ;  Surgeon: Eustace Moore, MD;  Location: [facility];  Service: Neurosurgery;  Laterality: N/A;   MAXIMUM ACCESS SURGERY(MAS) POSTERIOR LUMBAR INTERBODY FUSION LUMBAR THREE-FOUR   . PTCA    . SPINAL CORD STIMULATOR INSERTION N/A 06/16/2016   Procedure: LUMBAR SPINAL CORD STIMULATOR INSERTION;  Surgeon: Clydell Hakim, MD;  Location: [facility];  Service: Neurosurgery;  Laterality: N/A;  LUMBAR SPINAL CORD STIMULATOR INSERTION  . tens  05/2016  . TONSILLECTOMY      Family History  Problem Relation Age of Onset  . Heart attack Father   . Heart attack Mother   . Bladder Cancer Brother   . Cervical cancer Daughter        ?  . Colon cancer Neg Hx     Social History   Socioeconomic History  . Marital status: Married    Spouse name: Not on file  . Number of children: 3  . Years of education: Not on file  . Highest education level: Not on file  Occupational History  . Not on file  Social Needs  . Financial resource strain: Not on file  . Food insecurity    Worry:  Not on file    Inability: Not on file  . Transportation needs    Medical: Not on file    Non-medical: Not on file  Tobacco Use  . Smoking status: Former Smoker    Years: 15.00    Quit date: 11/20/1994    Years since quitting: 24.5  . Smokeless tobacco: Never Used  . Tobacco comment: Quit in 1996  Substance and Sexual Activity  . Alcohol use: No    Alcohol/week: 0.0 standard drinks  . Drug use: No  . Sexual activity: Yes  Lifestyle  . Physical activity    Days per week: Not on file    Minutes per session: Not on file  . Stress: Not on file  Relationships  . Social Herbalist on phone: Not on file    Gets together: Not on file    Attends religious service: Not on file    Active member of club or organization: Not on file    Attends meetings of clubs or organizations: Not on file    Relationship status: Not on file  . Intimate partner violence    Fear of current or ex partner: Not on file    Emotionally abused: Not on file    Physically abused: Not on file    Forced sexual activity: Not on file  Other Topics Concern  . Not on file  Social History Narrative   2 living children, one deceased age 32      ROS:  General: Negative for anorexia, weight loss, fever, chills, fatigue, weakness. Eyes: Negative for vision changes.  ENT: Negative for hoarseness, nasal congestion.  See HPI CV: Negative for chest pain, angina, palpitations, dyspnea on exertion, peripheral edema.  Respiratory: Negative for dyspnea at rest, dyspnea on exertion, cough, sputum, wheezing.  GI: See history of present illness. GU:  Negative for dysuria, hematuria, urinary incontinence, urinary frequency, nocturnal urination.  MS: Negative for joint pain, low back pain.  Derm: Negative for  rash or itching.  Neuro: Negative for weakness, abnormal sensation, seizure, frequent headaches, memory loss, confusion.  Psych: Negative for anxiety, depression, suicidal ideation, hallucinations.  Endo:  Negative for unusual weight change.  Heme: Negative for bruising or bleeding. Allergy: Negative for rash or hives.    Physical Examination:  BP 131/74   Pulse 84   Temp 98.4 F (36.9 C)   Ht 5\' 2"  (1.575 m)   Wt 172 lb 3.2 oz (78.1 kg)   BMI 31.50 kg/m    General: Well-nourished, well-developed in no acute distress.  Head: Normocephalic, atraumatic.   Eyes: Conjunctiva pink, no icterus. Mouth: Oropharyngeal mucosa moist and pink , no lesions erythema or exudate. Neck: Supple without thyromegaly, masses, or lymphadenopathy.  Lungs: Clear to auscultation bilaterally.  Heart: Regular rate and rhythm, no murmurs rubs or gallops.  Abdomen: Bowel sounds are normal, nontender, nondistended, no hepatosplenomegaly or masses, no abdominal bruits or    hernia , no rebound or guarding.   Rectal: Not performed Extremities: No lower extremity edema. No clubbing or deformities.  Neuro: Alert and oriented x 4 , grossly normal neurologically.  Skin: Warm and dry, no rash or jaundice.   Psych: Alert and cooperative, normal mood and affect.    Imaging Studies: Dg Bone Density (dxa)  Result Date: 06/03/2019 EXAM: DUAL X-RAY ABSORPTIOMETRY (DXA) FOR BONE MINERAL DENSITY IMPRESSION: Referring Physician:  [facility] Your patient completed a BMD test using Lunar IDXA DXA system ( analysis version: 16 ) manufactured by [employer]. Technologist: AW PATIENT: Name: Katherine, Larson Patient ID: [id number] Birth Date: Apr 09, 1946 Height: 62.0 in. Sex: Female Measured: 06/03/2019 Weight: 173.0 lbs. Indications: Advanced Age, Bilateral Ovariectomy (65.51), Caucasian, Gabapentin, Hysterectomy, Omeprazole, Postmenopausal Fractures: None Treatments: None ASSESSMENT: The BMD measured at Femur Neck Right is 0.742 g/cm2 with a T-score of -2.1. This patient is considered osteopenic according to [facility]) criteria. The scan quality is good. Lumbar spine was not utilized due to advanced  degenerative changes. Site Region Measured Date Measured Age YA BMD Significant CHANGE T-score DualFemur Neck Right 06/03/2019 71.5 -2.1 0.742 g/cm2 DualFemur Total Mean 06/03/2019 71.5 -0.1 1.001 g/cm2 Left Forearm Radius 33% 06/03/2019 71.5 -0.2 0.868 g/cm2 World Health Organization [facility]) criteria for post-menopausal, Caucasian Women: Normal       T-score at or above -1 SD Osteopenia   T-score between -1 and -2.5 SD Osteoporosis T-score at or below -2.5 SD RECOMMENDATION: 1. All patients should optimize calcium and vitamin D intake. 2. Consider FDA approved medical therapies in postmenopausal women and men aged 81 years and older, based on the following: a. A hip or vertebral (clinical or morphometric) fracture b. T- score < or = -2.5 at the femoral neck or spine after appropriate evaluation to exclude secondary causes c. Low bone mass (T-score between -1.0 and -2.5 at the femoral neck or spine) and a 10 year probability of a hip fracture > or = 3% or a 10 year probability of a major osteoporosis-related fracture > or = 20% based on the [place]-adapted WHO algorithm d. Clinician judgment and/or patient preferences may indicate treatment for people with 10-year fracture probabilities above or below these levels FOLLOW-UP: People with diagnosed cases of osteoporosis or at high risk for fracture should have regular bone mineral density tests. For patients eligible for Medicare, routine testing is allowed once every 2 years. The testing frequency can be increased to one year for patients who have rapidly progressing disease, those who are receiving or discontinuing medical  therapy to restore bone mass, or have additional risk factors. I have reviewed this report and agree with the above findings. [facility] FRAX* 10-year Probability of Fracture Based on femoral neck BMD: DualFemur (Right) Major Osteoporotic Fracture: 12.5% Hip Fracture:                2.8% Population:                  [place] (Caucasian) Risk  Factors:                None *FRAX is a Materials engineer of the [employer] of [employer] for Metabolic Bone Disease, a World Pharmacologist (WHO) [facility]. ASSESSMENT: The probability of a major osteoporotic fracture is 12.5 % within the next ten years. The probability of a hip fracture is 2.8 % within the next ten years. Electronically Signed   By: Dorise Bullion III M.D   On: 06/03/2019 12:47   Mm 3d Screen Breast Bilateral  Result Date: 05/08/2019 CLINICAL DATA:  Screening. EXAM: DIGITAL SCREENING BILATERAL MAMMOGRAM WITH TOMO AND CAD COMPARISON:  Previous exam(s). ACR Breast Density Category b: There are scattered areas of fibroglandular density. FINDINGS: There are no findings suspicious for malignancy. Images were processed with CAD. IMPRESSION: No mammographic evidence of malignancy. A result letter of this screening mammogram will be mailed directly to the patient. RECOMMENDATION: Screening mammogram in one year. (Code:SM-B-01Y) BI-RADS CATEGORY  1: Negative. Electronically Signed   By: Claudie Revering M.D.   On: 05/08/2019 11:46

## 2019-06-05 NOTE — Patient Instructions (Signed)
1. Increase omeprazole to 40mg  twice daily before a meal. RX sent to [facility]. 2. Check your omeprazole at home, IF it is 20mg  capsules you can take two twice a day before a meal until you get your new prescription.  3. Upper endoscopy as scheduled. See separate instructions.  4. We will request copy of your labs for review.

## 2019-06-06 ENCOUNTER — Telehealth: Payer: Self-pay

## 2019-06-06 NOTE — Assessment & Plan Note (Addendum)
Pleasant 73 year old lady presenting with refractory reflux, solid food/pill dysphagia.  Suspect esophageal web, ring, or stricture.  Plan for EGD with esophageal dilation with Dr. Gala Romney in the near future.  Plan for deep sedation given polypharmacy.  I have discussed the risks, alternatives, benefits with regards to but not limited to the risk of reaction to medication, bleeding, infection, perforation and the patient is agreeable to proceed. Written consent to be obtained.  Increase omeprazole to 40 mg twice daily before meals.  PATIENT HAS SPINAL CORD STIMULATOR

## 2019-06-06 NOTE — Telephone Encounter (Signed)
EGD/DIL w/Propofol w/RMR was scheduled yesterday during [place] for 08/07/19 at 10:15am. Instructions were given to pt. Pre-op appt 08/05/19 at 12:45pm. Appt letter mailed.

## 2019-06-09 NOTE — Progress Notes (Signed)
cc'ed to pcp °

## 2019-06-10 ENCOUNTER — Telehealth: Payer: Self-pay | Admitting: *Deleted

## 2019-06-10 ENCOUNTER — Encounter: Payer: Self-pay | Admitting: *Deleted

## 2019-06-10 NOTE — Telephone Encounter (Signed)
Called patient. She is aware RMR will not be available on 9/17 for procedure. She has rescheduled to 9/3 at 10:15am. Patient aware will mail new instructions with new pre-op appt. Called endo and LMOVM making aware of change.

## 2019-06-12 ENCOUNTER — Ambulatory Visit (HOSPITAL_COMMUNITY)
Admission: RE | Admit: 2019-06-12 | Discharge: 2019-06-12 | Disposition: A | Discharge status: Home / Self Care | Payer: Medicare Other | Source: Ambulatory Visit | Attending: Neurosurgery | Admitting: Neurosurgery

## 2019-06-12 ENCOUNTER — Other Ambulatory Visit: Payer: Self-pay

## 2019-06-12 DIAGNOSIS — M47816 Spondylosis without myelopathy or radiculopathy, lumbar region: Secondary | ICD-10-CM | POA: Insufficient documentation

## 2019-07-18 NOTE — Patient Instructions (Signed)
Katherine Larson  07/18/2019     @PREFPERIOPPHARMACY @   Your procedure is scheduled on  07/24/2019.  Report to [facility] at  0900   A.M.  Call this number if you have problems the morning of surgery:  [phone]   Remember:  Follow the diet instructions given to you by Dr Roseanne Kaufman office.                      Take these medicines the morning of surgery with A SIP OF WATER  Allopurinol, carvedilol, allegra, gabapentin, arava, prilosec. Use your inhaler before you come.    Do not wear jewelry, make-up or nail polish.  Do not wear lotions, powders, or perfumes. Please wear deodorant and brush your teeth.  Do not shave 48 hours prior to surgery.  Men may shave face and neck.  Do not bring valuables to the hospital.  [facility] is not responsible for any belongings or valuables.  Contacts, dentures or bridgework may not be worn into surgery.  Leave your suitcase in the car.  After surgery it may be brought to your room.  For patients admitted to the hospital, discharge time will be determined by your treatment team.  Patients discharged the day of surgery will not be allowed to drive home.   Name and phone number of your driver:   family Special instructions:  None  Please read over the following fact sheets that you were given. Anesthesia Post-op Instructions and Care and Recovery After Surgery       Upper Endoscopy, Adult, Care After This sheet gives you information about how to care for yourself after your procedure. Your health care provider may also give you more specific instructions. If you have problems or questions, contact your health care provider. What can I expect after the procedure? After the procedure, it is common to have:  A sore throat.  Mild stomach pain or discomfort.  Bloating.  Nausea. Follow these instructions at home:   Follow instructions from your health care provider about what to eat or drink after your procedure.  Return  to your normal activities as told by your health care provider. Ask your health care provider what activities are safe for you.  Take over-the-counter and prescription medicines only as told by your health care provider.  Do not drive for 24 hours if you were given a sedative during your procedure.  Keep all follow-up visits as told by your health care provider. This is important. Contact a health care provider if you have:  A sore throat that lasts longer than one day.  Trouble swallowing. Get help right away if:  You vomit blood or your vomit looks like coffee grounds.  You have: ? A fever. ? Bloody, black, or tarry stools. ? A severe sore throat or you cannot swallow. ? Difficulty breathing. ? Severe pain in your chest or abdomen. Summary  After the procedure, it is common to have a sore throat, mild stomach discomfort, bloating, and nausea.  Do not drive for 24 hours if you were given a sedative during the procedure.  Follow instructions from your health care provider about what to eat or drink after your procedure.  Return to your normal activities as told by your health care provider. This information is not intended to replace advice given to you by your health care provider. Make sure you discuss any questions you have with your health care  provider. Document Released: 05/07/2012 Document Revised: 04/30/2018 Document Reviewed: 04/08/2018 Elsevier Patient Education  2020 [place].  Esophageal Dilatation Esophageal dilatation, also called esophageal dilation, is a procedure to widen or open (dilate) a blocked or narrowed part of the esophagus. The esophagus is the part of the body that moves food and liquid from the mouth to the stomach. You may need this procedure if:  You have a buildup of scar tissue in your esophagus that makes it difficult, painful, or impossible to swallow. This can be caused by gastroesophageal reflux disease (GERD).  You have cancer of the  esophagus.  There is a problem with how food moves through your esophagus. In some cases, you may need this procedure repeated at a later time to dilate the esophagus gradually. Tell a health care provider about:  Any allergies you have.  All medicines you are taking, including vitamins, herbs, eye drops, creams, and over-the-counter medicines.  Any problems you or family members have had with anesthetic medicines.  Any blood disorders you have.  Any surgeries you have had.  Any medical conditions you have.  Any antibiotic medicines you are required to take before dental procedures.  Whether you are pregnant or may be pregnant. What are the risks? Generally, this is a safe procedure. However, problems may occur, including:  Bleeding due to a tear in the lining of the esophagus.  A hole (perforation) in the esophagus. What happens before the procedure?  Follow instructions from your health care provider about eating or drinking restrictions.  Ask your health care provider about changing or stopping your regular medicines. This is especially important if you are taking diabetes medicines or blood thinners.  Plan to have someone take you home from the hospital or clinic.  Plan to have a responsible adult care for you for at least 24 hours after you leave the hospital or clinic. This is important. What happens during the procedure?  You may be given a medicine to help you relax (sedative).  A numbing medicine may be sprayed into the back of your throat, or you may gargle the medicine.  Your health care provider may perform the dilatation using various surgical instruments, such as: ? Simple dilators. This instrument is carefully placed in the esophagus to stretch it. ? Guided wire bougies. This involves using an endoscope to insert a wire into the esophagus. A dilator is passed over this wire to enlarge the esophagus. Then the wire is removed. ? Balloon dilators. An endoscope  with a small balloon at the end is inserted into the esophagus. The balloon is inflated to stretch the esophagus and open it up. The procedure may vary among health care providers and hospitals. What happens after the procedure?  Your blood pressure, heart rate, breathing rate, and blood oxygen level will be monitored until the medicines you were given have worn off.  Your throat may feel slightly sore and numb. This will improve slowly over time.  You will not be allowed to eat or drink until your throat is no longer numb.  When you are able to drink, urinate, and sit on the edge of the bed without nausea or dizziness, you may be able to return home. Follow these instructions at home:  Take over-the-counter and prescription medicines only as told by your health care provider.  Do not drive for 24 hours if you were given a sedative during your procedure.  You should have a responsible adult with you for 24 hours after  the procedure.  Follow instructions from your health care provider about any eating or drinking restrictions.  Do not use any products that contain nicotine or tobacco, such as cigarettes and e-cigarettes. If you need help quitting, ask your health care provider.  Keep all follow-up visits as told by your health care provider. This is important. Get help right away if you:  Have a fever.  Have chest pain.  Have pain that is not relieved by medication.  Have trouble breathing.  Have trouble swallowing.  Vomit blood. Summary  Esophageal dilatation, also called esophageal dilation, is a procedure to widen or open (dilate) a blocked or narrowed part of the esophagus.  Plan to have someone take you home from the hospital or clinic.  For this procedure, a numbing medicine may be sprayed into the back of your throat, or you may gargle the medicine.  Do not drive for 24 hours if you were given a sedative during your procedure. This information is not intended to  replace advice given to you by your health care provider. Make sure you discuss any questions you have with your health care provider. Document Released: 12/28/2005 Document Revised: 10/19/2017 Document Reviewed: 09/11/2017 Elsevier Patient Education  2020 [place] After These instructions provide you with information about caring for yourself after your procedure. Your health care provider may also give you more specific instructions. Your treatment has been planned according to current medical practices, but problems sometimes occur. Call your health care provider if you have any problems or questions after your procedure. What can I expect after the procedure? After your procedure, you may:  Feel sleepy for several hours.  Feel clumsy and have poor balance for several hours.  Feel forgetful about what happened after the procedure.  Have poor judgment for several hours.  Feel nauseous or vomit.  Have a sore throat if you had a breathing tube during the procedure. Follow these instructions at home: For at least 24 hours after the procedure:      Have a responsible adult stay with you. It is important to have someone help care for you until you are awake and alert.  Rest as needed.  Do not: ? Participate in activities in which you could fall or become injured. ? Drive. ? Use heavy machinery. ? Drink alcohol. ? Take sleeping pills or medicines that cause drowsiness. ? Make important decisions or sign legal documents. ? Take care of children on your own. Eating and drinking  Follow the diet that is recommended by your health care provider.  If you vomit, drink water, juice, or soup when you can drink without vomiting.  Make sure you have little or no nausea before eating solid foods. General instructions  Take over-the-counter and prescription medicines only as told by your health care provider.  If you have sleep apnea, surgery and  certain medicines can increase your risk for breathing problems. Follow instructions from your health care provider about wearing your sleep device: ? Anytime you are sleeping, including during daytime naps. ? While taking prescription pain medicines, sleeping medicines, or medicines that make you drowsy.  If you smoke, do not smoke without supervision.  Keep all follow-up visits as told by your health care provider. This is important. Contact a health care provider if:  You keep feeling nauseous or you keep vomiting.  You feel light-headed.  You develop a rash.  You have a fever. Get help right away if:  You have  trouble breathing. Summary  For several hours after your procedure, you may feel sleepy and have poor judgment.  Have a responsible adult stay with you for at least 24 hours or until you are awake and alert. This information is not intended to replace advice given to you by your health care provider. Make sure you discuss any questions you have with your health care provider. Document Released: 02/27/2016 Document Revised: 02/04/2018 Document Reviewed: 02/27/2016 Elsevier Patient Education  2020 [employer].

## 2019-07-22 ENCOUNTER — Encounter (HOSPITAL_COMMUNITY): Payer: Self-pay

## 2019-07-22 ENCOUNTER — Other Ambulatory Visit (HOSPITAL_COMMUNITY)
Admission: RE | Admit: 2019-07-22 | Discharge: 2019-07-22 | Disposition: A | Discharge status: Home / Self Care | Payer: Medicare Other | Source: Ambulatory Visit | Attending: Internal Medicine | Admitting: Internal Medicine

## 2019-07-22 ENCOUNTER — Other Ambulatory Visit: Payer: Self-pay

## 2019-07-22 ENCOUNTER — Encounter (HOSPITAL_COMMUNITY)
Admission: RE | Admit: 2019-07-22 | Discharge: 2019-07-22 | Disposition: A | Discharge status: Home / Self Care | Payer: Medicare Other | Source: Ambulatory Visit | Attending: Internal Medicine | Admitting: Internal Medicine

## 2019-07-22 DIAGNOSIS — N189 Chronic kidney disease, unspecified: Secondary | ICD-10-CM | POA: Diagnosis not present

## 2019-07-22 DIAGNOSIS — E785 Hyperlipidemia, unspecified: Secondary | ICD-10-CM | POA: Diagnosis not present

## 2019-07-22 DIAGNOSIS — Z87891 Personal history of nicotine dependence: Secondary | ICD-10-CM | POA: Diagnosis not present

## 2019-07-22 DIAGNOSIS — I428 Other cardiomyopathies: Secondary | ICD-10-CM | POA: Diagnosis not present

## 2019-07-22 DIAGNOSIS — I5022 Chronic systolic (congestive) heart failure: Secondary | ICD-10-CM | POA: Diagnosis not present

## 2019-07-22 DIAGNOSIS — Z7982 Long term (current) use of aspirin: Secondary | ICD-10-CM | POA: Diagnosis not present

## 2019-07-22 DIAGNOSIS — Z9049 Acquired absence of other specified parts of digestive tract: Secondary | ICD-10-CM | POA: Diagnosis not present

## 2019-07-22 DIAGNOSIS — E1140 Type 2 diabetes mellitus with diabetic neuropathy, unspecified: Secondary | ICD-10-CM | POA: Diagnosis not present

## 2019-07-22 DIAGNOSIS — I2510 Atherosclerotic heart disease of native coronary artery without angina pectoris: Secondary | ICD-10-CM | POA: Diagnosis not present

## 2019-07-22 DIAGNOSIS — Z951 Presence of aortocoronary bypass graft: Secondary | ICD-10-CM | POA: Diagnosis not present

## 2019-07-22 DIAGNOSIS — K5710 Diverticulosis of small intestine without perforation or abscess without bleeding: Secondary | ICD-10-CM | POA: Diagnosis not present

## 2019-07-22 DIAGNOSIS — Z79899 Other long term (current) drug therapy: Secondary | ICD-10-CM | POA: Diagnosis not present

## 2019-07-22 DIAGNOSIS — I130 Hypertensive heart and chronic kidney disease with heart failure and stage 1 through stage 4 chronic kidney disease, or unspecified chronic kidney disease: Secondary | ICD-10-CM | POA: Diagnosis not present

## 2019-07-22 DIAGNOSIS — J302 Other seasonal allergic rhinitis: Secondary | ICD-10-CM | POA: Diagnosis not present

## 2019-07-22 DIAGNOSIS — E1122 Type 2 diabetes mellitus with diabetic chronic kidney disease: Secondary | ICD-10-CM | POA: Diagnosis not present

## 2019-07-22 DIAGNOSIS — J449 Chronic obstructive pulmonary disease, unspecified: Secondary | ICD-10-CM | POA: Diagnosis not present

## 2019-07-22 DIAGNOSIS — Z7984 Long term (current) use of oral hypoglycemic drugs: Secondary | ICD-10-CM | POA: Diagnosis not present

## 2019-07-22 DIAGNOSIS — I252 Old myocardial infarction: Secondary | ICD-10-CM | POA: Diagnosis not present

## 2019-07-22 DIAGNOSIS — K219 Gastro-esophageal reflux disease without esophagitis: Secondary | ICD-10-CM | POA: Diagnosis not present

## 2019-07-22 DIAGNOSIS — M109 Gout, unspecified: Secondary | ICD-10-CM | POA: Diagnosis not present

## 2019-07-22 DIAGNOSIS — R1314 Dysphagia, pharyngoesophageal phase: Secondary | ICD-10-CM | POA: Diagnosis present

## 2019-07-22 DIAGNOSIS — K449 Diaphragmatic hernia without obstruction or gangrene: Secondary | ICD-10-CM | POA: Diagnosis not present

## 2019-07-22 HISTORY — DX: Unspecified macular degeneration: H35.30

## 2019-07-22 LAB — BASIC METABOLIC PANEL
Anion gap: 12 (ref 5–15)
BUN: 17 mg/dL (ref 8–23)
CO2: 22 mmol/L (ref 22–32)
Calcium: 9 mg/dL (ref 8.9–10.3)
Chloride: 101 mmol/L (ref 98–111)
Creatinine, Ser: 0.85 mg/dL (ref 0.44–1.00)
GFR calc Af Amer: >60 mL/min (ref >60)
GFR calc non Af Amer: >60 mL/min (ref >60)
Glucose, Bld: 146 mg/dL — ABNORMAL HIGH (ref 70–99)
Potassium: 4.4 mmol/L (ref 3.5–5.1)
Sodium: 135 mmol/L (ref 135–145)

## 2019-07-22 LAB — SARS CORONAVIRUS 2 (TAT 6-24 HRS): SARS Coronavirus 2: NEGATIVE

## 2019-07-23 ENCOUNTER — Telehealth: Payer: Self-pay

## 2019-07-23 NOTE — Telephone Encounter (Signed)
Pt called to ask if she should take her Metformin before her procedure. Pt wasn't taking Metformin when she was scheduled for her EGD. Pt is going to take her Metformin today and hold her morning dose. Discussed with LSL.

## 2019-07-24 ENCOUNTER — Encounter (HOSPITAL_COMMUNITY)
Admission: RE | Disposition: A | Discharge status: Home / Self Care | Payer: Self-pay | Source: Ambulatory Visit | Attending: Internal Medicine

## 2019-07-24 ENCOUNTER — Ambulatory Visit (HOSPITAL_COMMUNITY): Payer: Medicare Other | Admitting: Anesthesiology

## 2019-07-24 ENCOUNTER — Ambulatory Visit (HOSPITAL_COMMUNITY)
Admission: RE | Admit: 2019-07-24 | Discharge: 2019-07-24 | Disposition: A | Discharge status: Home / Self Care | Payer: Medicare Other | Source: Ambulatory Visit | Attending: Internal Medicine | Admitting: Internal Medicine

## 2019-07-24 ENCOUNTER — Encounter (HOSPITAL_COMMUNITY): Payer: Self-pay | Admitting: *Deleted

## 2019-07-24 DIAGNOSIS — E1122 Type 2 diabetes mellitus with diabetic chronic kidney disease: Secondary | ICD-10-CM | POA: Insufficient documentation

## 2019-07-24 DIAGNOSIS — I2510 Atherosclerotic heart disease of native coronary artery without angina pectoris: Secondary | ICD-10-CM | POA: Insufficient documentation

## 2019-07-24 DIAGNOSIS — E1140 Type 2 diabetes mellitus with diabetic neuropathy, unspecified: Secondary | ICD-10-CM | POA: Insufficient documentation

## 2019-07-24 DIAGNOSIS — N189 Chronic kidney disease, unspecified: Secondary | ICD-10-CM | POA: Insufficient documentation

## 2019-07-24 DIAGNOSIS — R1319 Other dysphagia: Secondary | ICD-10-CM

## 2019-07-24 DIAGNOSIS — K219 Gastro-esophageal reflux disease without esophagitis: Secondary | ICD-10-CM | POA: Diagnosis not present

## 2019-07-24 DIAGNOSIS — K449 Diaphragmatic hernia without obstruction or gangrene: Secondary | ICD-10-CM | POA: Diagnosis not present

## 2019-07-24 DIAGNOSIS — J449 Chronic obstructive pulmonary disease, unspecified: Secondary | ICD-10-CM | POA: Insufficient documentation

## 2019-07-24 DIAGNOSIS — K5710 Diverticulosis of small intestine without perforation or abscess without bleeding: Secondary | ICD-10-CM | POA: Diagnosis not present

## 2019-07-24 DIAGNOSIS — Z7982 Long term (current) use of aspirin: Secondary | ICD-10-CM | POA: Insufficient documentation

## 2019-07-24 DIAGNOSIS — R1310 Dysphagia, unspecified: Secondary | ICD-10-CM | POA: Diagnosis not present

## 2019-07-24 DIAGNOSIS — Z7984 Long term (current) use of oral hypoglycemic drugs: Secondary | ICD-10-CM | POA: Insufficient documentation

## 2019-07-24 DIAGNOSIS — I130 Hypertensive heart and chronic kidney disease with heart failure and stage 1 through stage 4 chronic kidney disease, or unspecified chronic kidney disease: Secondary | ICD-10-CM | POA: Insufficient documentation

## 2019-07-24 DIAGNOSIS — Z9049 Acquired absence of other specified parts of digestive tract: Secondary | ICD-10-CM | POA: Insufficient documentation

## 2019-07-24 DIAGNOSIS — Z951 Presence of aortocoronary bypass graft: Secondary | ICD-10-CM | POA: Insufficient documentation

## 2019-07-24 DIAGNOSIS — E785 Hyperlipidemia, unspecified: Secondary | ICD-10-CM | POA: Insufficient documentation

## 2019-07-24 DIAGNOSIS — I428 Other cardiomyopathies: Secondary | ICD-10-CM | POA: Insufficient documentation

## 2019-07-24 DIAGNOSIS — I252 Old myocardial infarction: Secondary | ICD-10-CM | POA: Insufficient documentation

## 2019-07-24 DIAGNOSIS — I5022 Chronic systolic (congestive) heart failure: Secondary | ICD-10-CM | POA: Insufficient documentation

## 2019-07-24 DIAGNOSIS — J302 Other seasonal allergic rhinitis: Secondary | ICD-10-CM | POA: Insufficient documentation

## 2019-07-24 DIAGNOSIS — Z79899 Other long term (current) drug therapy: Secondary | ICD-10-CM | POA: Insufficient documentation

## 2019-07-24 DIAGNOSIS — R1314 Dysphagia, pharyngoesophageal phase: Secondary | ICD-10-CM | POA: Diagnosis not present

## 2019-07-24 DIAGNOSIS — Z87891 Personal history of nicotine dependence: Secondary | ICD-10-CM | POA: Insufficient documentation

## 2019-07-24 DIAGNOSIS — M109 Gout, unspecified: Secondary | ICD-10-CM | POA: Insufficient documentation

## 2019-07-24 HISTORY — PX: MALONEY DILATION: SHX5535

## 2019-07-24 HISTORY — PX: ESOPHAGOGASTRODUODENOSCOPY (EGD) WITH PROPOFOL: SHX5813

## 2019-07-24 LAB — GLUCOSE, CAPILLARY
Glucose-Capillary: 140 mg/dL — ABNORMAL HIGH (ref 70–99)
Glucose-Capillary: 123 mg/dL — ABNORMAL HIGH (ref 70–99)

## 2019-07-24 SURGERY — ESOPHAGOGASTRODUODENOSCOPY (EGD) WITH PROPOFOL
Anesthesia: General

## 2019-07-24 MED ORDER — PROPOFOL 10 MG/ML IV BOLUS
INTRAVENOUS | Status: AC
  Filled 2019-07-24: qty 40

## 2019-07-24 MED ORDER — PROPOFOL 500 MG/50ML IV EMUL
INTRAVENOUS | Status: DC | PRN
  Administered 2019-07-24: 150 ug/kg/min via INTRAVENOUS

## 2019-07-24 MED ORDER — LACTATED RINGERS IV SOLN
INTRAVENOUS | Status: DC | PRN
  Administered 2019-07-24: 10:00:00 via INTRAVENOUS

## 2019-07-24 MED ORDER — GLYCOPYRROLATE PF 0.2 MG/ML IJ SOSY
PREFILLED_SYRINGE | INTRAMUSCULAR | Status: AC
  Filled 2019-07-24: qty 2

## 2019-07-24 MED ORDER — LIDOCAINE 2% (20 MG/ML) 5 ML SYRINGE
INTRAMUSCULAR | Status: AC
  Filled 2019-07-24: qty 10

## 2019-07-24 MED ORDER — CHLORHEXIDINE GLUCONATE CLOTH 2 % EX PADS: 6.0000 | MEDICATED_PAD | Freq: Once | CUTANEOUS | Status: DC

## 2019-07-24 MED ORDER — PHENYLEPHRINE 40 MCG/ML (10ML) SYRINGE FOR IV PUSH (FOR BLOOD PRESSURE SUPPORT)
PREFILLED_SYRINGE | INTRAVENOUS | Status: AC
  Filled 2019-07-24: qty 10

## 2019-07-24 MED ORDER — EPHEDRINE 5 MG/ML INJ
INTRAVENOUS | Status: AC
  Filled 2019-07-24: qty 20

## 2019-07-24 MED ORDER — KETAMINE HCL 50 MG/5ML IJ SOSY
PREFILLED_SYRINGE | INTRAMUSCULAR | Status: AC
  Filled 2019-07-24: qty 5

## 2019-07-24 MED ORDER — KETAMINE HCL 10 MG/ML IJ SOLN
INTRAMUSCULAR | Status: DC | PRN
  Administered 2019-07-24: 20 mg via INTRAVENOUS

## 2019-07-24 MED ORDER — LIDOCAINE HCL (CARDIAC) PF 100 MG/5ML IV SOSY
PREFILLED_SYRINGE | INTRAVENOUS | Status: DC | PRN
  Administered 2019-07-24: 50 mg via INTRAVENOUS

## 2019-07-24 NOTE — H&P (Signed)
@LOGO @   Primary Care Physician:  Harlan Stains, MD Primary Gastroenterologist:  Dr. Gala Romney  Pre-Procedure History & Physical: HPI:  Katherine Larson is a 73 y.o. female here for a full refractory GERD in spite of escalating acid suppression to omeprazole 40 mg twice daily.  Recurrent esophageal dysphagia.  Normal-appearing esophagus 2017 dysphagia improved dramatically at that time after empiric passage of a 54 French Maloney dilator.  Past Medical History:  Diagnosis Date  . Anemia    anemia of chronic disease +/- IDA followed by Dr. Earlie Server. received iron infusions in the past  . Asthma   . Borderline glaucoma   . CAD (coronary artery disease)    a. s/p CABG 1996. b. low risk nuc 2011. c. LHC 04/2016 due to drop in EF -> occluded native LAD,  widely patent sequential LIMA-D1-LAD.  Marland Kitchen Chronic kidney disease    "mild stage of kidney disease"  . Chronic systolic CHF (congestive heart failure) ([place])   . Complication of anesthesia   . DM2 (diabetes mellitus, type 2) ([place])    not on any medicine for this at this time, 05/2016  . GERD (gastroesophageal reflux disease)   . Gout   . History of hiatal hernia    in 13s  . History of pneumonia    March, 2016  . HLD (hyperlipidemia)   . HTN (hypertension)   . Hyperthyroidism 01/21/2016  . Inappropriate sinus tachycardia   . LBBB (left bundle branch block)    a. Seen in 04/2016  . Macular degeneration    left  . Myocardial infarction ([place])    1996  . Neuropathy   . NICM (nonischemic cardiomyopathy) ([place])    a. Dx 04/2016 - EF 25-30%, diffuse HK, elevated LVEDP, mild MR, mod LAE.  Marland Kitchen NSVT (nonsustained ventricular tachycardia) ([place])   . PAT (paroxysmal atrial tachycardia) ([place])   . PONV (postoperative nausea and vomiting)    "after just about every surgery I've had"  . Rheumatoid arthritis ([facility])    RA  . Seasonal allergies     Past Surgical History:  Procedure Laterality Date  . ABDOMINAL HYSTERECTOMY    . APPENDECTOMY    . BACK  SURGERY    . CARDIAC CATHETERIZATION N/A 05/01/2016   Procedure: Right/Left Heart Cath and Coronary/Graft Angiography;  Surgeon: Leonie Man, MD;  Location: [facility];  Service: Cardiovascular;  Laterality: N/A;  . CHOLECYSTECTOMY    . COLONOSCOPY  11/2011   Dr. Magod:ext/int hemorrhoids, diverticulosis sigmoid colon and distal desc colon, mid-desc colon hyperplastic polyps.   . CORONARY ARTERY BYPASS GRAFT  1996   2 vessels  . ESOPHAGOGASTRODUODENOSCOPY  11/2011   Dr. Watt Climes: small hiatal hernia, one non-bleeding superficial gastric ulcer, medium-sized diverticulum in area of papilla  . ESOPHAGOGASTRODUODENOSCOPY N/A 12/29/2015   Dr. Gala Romney: Chronic inactive gastritis, normal esophagus status post dilation, duodenal diverticula  . EYE SURGERY Bilateral    cataract removal  . FOOT SURGERY     removal bone spur  . FRACTURE SURGERY Right    arm  . GIVENS CAPSULE STUDY  11/2012   Dr. Watt Climes: minimal gastritis, normal small bowel capsule  . HERNIA REPAIR     hiatal hernia  . MAXIMUM ACCESS (MAS)POSTERIOR LUMBAR INTERBODY FUSION (PLIF) 1 LEVEL N/A 08/14/2013   Procedure:  MAXIMUM ACCESS SURGERY(MAS) POSTERIOR LUMBAR INTERBODY FUSION LUMBAR THREE-FOUR ;  Surgeon: Eustace Moore, MD;  Location: [facility];  Service: Neurosurgery;  Laterality: N/A;   MAXIMUM ACCESS SURGERY(MAS) POSTERIOR  LUMBAR INTERBODY FUSION LUMBAR THREE-FOUR   . PTCA    . SPINAL CORD STIMULATOR INSERTION N/A 06/16/2016   Procedure: LUMBAR SPINAL CORD STIMULATOR INSERTION;  Surgeon: Clydell Hakim, MD;  Location: [facility];  Service: Neurosurgery;  Laterality: N/A;  LUMBAR SPINAL CORD STIMULATOR INSERTION  . tens  05/2016  . TONSILLECTOMY      Prior to Admission medications   Medication Sig Start Date End Date Taking? Authorizing Provider  acetaminophen (TYLENOL) 325 MG tablet Take 325 mg by mouth every 6 (six) hours as needed for mild pain.    Yes [provider]  albuterol (PROVENTIL HFA;VENTOLIN HFA) 108  (90 Base) MCG/ACT inhaler Take 2 puffs 3 times a day and every 4 hours as needed. Patient taking differently: Inhale into the lungs. Take 2 puffs 3 times a day and every 4 hours as needed. 01/24/16  Yes Rexene Alberts, MD  allopurinol (ZYLOPRIM) 300 MG tablet Take 300 mg by mouth daily.   Yes [provider]  aspirin EC 81 MG EC tablet Take 1 tablet (81 mg total) by mouth daily. Patient taking differently: Take 81 mg by mouth every morning.  05/03/16  Yes Kilroy, Luke K, PA-C  atorvastatin (LIPITOR) 80 MG tablet Take 80 mg by mouth daily.   Yes [provider]  carvedilol (COREG) 12.5 MG tablet TAKE 1 TABLET BY MOUTH TWO  TIMES DAILY Patient taking differently: Take 12.5 mg by mouth daily.  01/28/19  Yes Josue Hector, MD  cholecalciferol (VITAMIN D3) 25 MCG (1000 UT) tablet Take 1,000 Units by mouth daily.   Yes [provider]  fexofenadine (ALLEGRA) 180 MG tablet Take 180 mg by mouth daily.     Yes [provider]  fluticasone (FLONASE) 50 MCG/ACT nasal spray Place 2 sprays into both nostrils daily as needed for allergies.    Yes [provider]  furosemide (LASIX) 40 MG tablet Take 40 mg by mouth 2 (two) times daily.    Yes [provider]  gabapentin (NEURONTIN) 300 MG capsule Take 300 mg by mouth 3 (three) times daily.  03/01/18  Yes [provider]  HYDROcodone-acetaminophen (NORCO) 7.5-325 MG tablet Take 1 tablet by mouth every 6 (six) hours as needed for moderate pain.   Yes [provider]  leflunomide (ARAVA) 20 MG tablet Take 20 mg by mouth daily.  03/04/19  Yes [provider]  lisinopril (PRINIVIL,ZESTRIL) 5 MG tablet TAKE 1 TABLET BY MOUTH  DAILY 01/28/19  Yes Josue Hector, MD  MAGNESIUM-OXIDE 400 (241.3 Mg) MG tablet TAKE 1 TABLET BY MOUTH EVERY DAY 03/13/19  Yes Josue Hector, MD  metFORMIN (GLUCOPHAGE) 500 MG tablet Take 1,000 mg by mouth 2 (two) times daily with a meal.   Yes [provider]   metoCLOPramide (REGLAN) 5 MG tablet Take 10 mg by mouth at bedtime.    Yes [provider]  montelukast (SINGULAIR) 10 MG tablet Take 10 mg by mouth at bedtime.   Yes [provider]  Multiple Vitamins-Minerals (MACULAR VITAMIN BENEFIT PO) Take 2 tablets by mouth daily.   Yes [provider]  nitroGLYCERIN (NITROSTAT) 0.4 MG SL tablet Place 1 tablet (0.4 mg total) under the tongue every 5 (five) minutes as needed for chest pain. Reported on 03/24/2016 06/22/17  Yes Evans Lance, MD  omeprazole (PRILOSEC) 40 MG capsule Take 1 capsule (40 mg total) by mouth 2 (two) times daily before a meal. 06/05/19  Yes Mahala Menghini, PA-C  Polyethyl  Glycol-Propyl Glycol (SYSTANE OP) Apply 1 drop to eye 3 (three) times daily as needed (Dry Eyes).   Yes [provider]  potassium chloride SA (K-DUR,KLOR-CON) 20 MEQ tablet Take 1 tablet (20 mEq total) by mouth 2 (two) times daily. 05/03/16  Yes Kilroy, Luke K, PA-C  triamcinolone cream (KENALOG) 0.1 % Apply 1 application topically daily as needed (for irritation).    Yes [provider]  Lancets (ONETOUCH DELICA PLUS 123XX123) [place]  01/06/19   [provider]    Allergies as of 06/05/2019 - Review Complete 06/05/2019  Allergen Reaction Noted  . Biaxin [clarithromycin] Rash and Other (See Comments) 02/02/2010  . Penicillins Rash and Other (See Comments) 02/02/2010    Family History  Problem Relation Age of Onset  . Heart attack Father   . Heart attack Mother   . Bladder Cancer Brother   . Cervical cancer Daughter        ?  . Colon cancer Neg Hx     Social History   Socioeconomic History  . Marital status: Married    Spouse name: Not on file  . Number of children: 3  . Years of education: Not on file  . Highest education level: Not on file  Occupational History  . Not on file  Social Needs  . Financial resource strain: Not on file  . Food insecurity    Worry: Not on file    Inability: Not on  file  . Transportation needs    Medical: Not on file    Non-medical: Not on file  Tobacco Use  . Smoking status: Former Smoker    Packs/day: 0.75    Years: 15.00    Pack years: 11.25    Types: Cigarettes    Quit date: 11/20/1994    Years since quitting: 24.6  . Smokeless tobacco: Never Used  . Tobacco comment: Quit in 1996  Substance and Sexual Activity  . Alcohol use: No    Alcohol/week: 0.0 standard drinks  . Drug use: No  . Sexual activity: Yes  Lifestyle  . Physical activity    Days per week: Not on file    Minutes per session: Not on file  . Stress: Not on file  Relationships  . Social Herbalist on phone: Not on file    Gets together: Not on file    Attends religious service: Not on file    Active member of club or organization: Not on file    Attends meetings of clubs or organizations: Not on file    Relationship status: Not on file  . Intimate partner violence    Fear of current or ex partner: Not on file    Emotionally abused: Not on file    Physically abused: Not on file    Forced sexual activity: Not on file  Other Topics Concern  . Not on file  Social History Narrative   2 living children, one deceased age 98    Review of Systems: See HPI, otherwise negative ROS  Physical Exam: BP (!) 144/59   Pulse (!) 102   Temp 98 F (36.7 C) (Oral)   Resp 14   Ht 5\' 2"  (1.575 m)   Wt 74.2 kg   SpO2 96%   BMI 29.92 kg/m  General:   Alert,  Well-developed, well-nourished, pleasant and cooperative in NAD Neck:  Supple; no masses or thyromegaly. No significant cervical adenopathy. Lungs:  Clear throughout to auscultation.   No wheezes, crackles,  or rhonchi. No acute distress. Heart:  Regular rate and rhythm; no murmurs, clicks, rubs,  or gallops. Abdomen: Non-distended, normal bowel sounds.  Soft and nontender without appreciable mass or hepatosplenomegaly.  Pulses:  Normal pulses noted. Extremities:  Without clubbing or edema.  Impression/Plan:  Pleasant 73 year old lady with poorly controlled GERD now with recurrent esophageal dysphagia.  She is here for EGD with esophageal dilation as feasible/appropriate per plan. The risks, benefits, limitations, alternatives and imponderables have been reviewed with the patient. Potential for esophageal dilation, biopsy, etc. have also been reviewed.  Questions have been answered. All parties agreeable.     Notice: This dictation was prepared with Dragon dictation along with smaller phrase technology. Any transcriptional errors that result from this process are unintentional and may not be corrected upon review.

## 2019-07-24 NOTE — Discharge Instructions (Signed)
EGD Discharge instructions Please read the instructions outlined below and refer to this sheet in the next few weeks. These discharge instructions provide you with general information on caring for yourself after you leave the hospital. Your doctor may also give you specific instructions. While your treatment has been planned according to the most current medical practices available, unavoidable complications occasionally occur. If you have any problems or questions after discharge, please call your doctor. ACTIVITY  You may resume your regular activity but move at a slower pace for the next 24 hours.   Take frequent rest periods for the next 24 hours.   Walking will help expel (get rid of) the air and reduce the bloated feeling in your abdomen.   No driving for 24 hours (because of the anesthesia (medicine) used during the test).   You may shower.   Do not sign any important legal documents or operate any machinery for 24 hours (because of the anesthesia used during the test).  NUTRITION  Drink plenty of fluids.   You may resume your normal diet.   Begin with a light meal and progress to your normal diet.   Avoid alcoholic beverages for 24 hours or as instructed by your caregiver.  MEDICATIONS  You may resume your normal medications unless your caregiver tells you otherwise.  WHAT YOU CAN EXPECT TODAY  You may experience abdominal discomfort such as a feeling of fullness or gas pains.  FOLLOW-UP  Your doctor will discuss the results of your test with you.  SEEK IMMEDIATE MEDICAL ATTENTION IF ANY OF THE FOLLOWING OCCUR:  Excessive nausea (feeling sick to your stomach) and/or vomiting.   Severe abdominal pain and distention (swelling).   Trouble swallowing.   Temperature over 101 F (37.8 C).   Rectal bleeding or vomiting of blood.   GERD information provided  Stop Nexium; begin Protonix 40 mg twice daily   Gastroesophageal Reflux Disease, Adult Gastroesophageal  reflux (GER) happens when acid from the stomach flows up into the tube that connects the mouth and the stomach (esophagus). Normally, food travels down the esophagus and stays in the stomach to be digested. With GER, food and stomach acid sometimes move back up into the esophagus. You may have a disease called gastroesophageal reflux disease (GERD) if the reflux: Happens often. Causes frequent or very bad symptoms. Causes problems such as damage to the esophagus. When this happens, the esophagus becomes sore and swollen (inflamed). Over time, GERD can make small holes (ulcers) in the lining of the esophagus. What are the causes? This condition is caused by a problem with the muscle between the esophagus and the stomach. When this muscle is weak or not normal, it does not close properly to keep food and acid from coming back up from the stomach. The muscle can be weak because of: Tobacco use. Pregnancy. Having a certain type of hernia (hiatal hernia). Alcohol use. Certain foods and drinks, such as coffee, chocolate, onions, and peppermint. What increases the risk? You are more likely to develop this condition if you: Are overweight. Have a disease that affects your connective tissue. Use NSAID medicines. What are the signs or symptoms? Symptoms of this condition include: Heartburn. Difficult or painful swallowing. The feeling of having a lump in the throat. A bitter taste in the mouth. Bad breath. Having a lot of saliva. Having an upset or bloated stomach. Belching. Chest pain. Different conditions can cause chest pain. Make sure you see your doctor if you have chest pain.  Shortness of breath or noisy breathing (wheezing). Ongoing (chronic) cough or a cough at night. Wearing away of the surface of teeth (tooth enamel). Weight loss. How is this treated? Treatment will depend on how bad your symptoms are. Your doctor may suggest: Changes to your diet. Medicine. Surgery. Follow these  instructions at home: Eating and drinking  Follow a diet as told by your doctor. You may need to avoid foods and drinks such as: Coffee and tea (with or without caffeine). Drinks that contain alcohol. Energy drinks and sports drinks. Bubbly (carbonated) drinks or sodas. Chocolate and cocoa. Peppermint and mint flavorings. Garlic and onions. Horseradish. Spicy and acidic foods. These include peppers, chili powder, curry powder, vinegar, hot sauces, and BBQ sauce. Citrus fruit juices and citrus fruits, such as oranges, lemons, and limes. Tomato-based foods. These include red sauce, chili, salsa, and pizza with red sauce. Fried and fatty foods. These include donuts, french fries, potato chips, and high-fat dressings. High-fat meats. These include hot dogs, rib eye steak, sausage, ham, and bacon. High-fat dairy items, such as whole milk, butter, and cream cheese. Eat small meals often. Avoid eating large meals. Avoid drinking large amounts of liquid with your meals. Avoid eating meals during the 2-3 hours before bedtime. Avoid lying down right after you eat. Do not exercise right after you eat. Lifestyle  Do not use any products that contain nicotine or tobacco. These include cigarettes, e-cigarettes, and chewing tobacco. If you need help quitting, ask your doctor. Try to lower your stress. If you need help doing this, ask your doctor. If you are overweight, lose an amount of weight that is healthy for you. Ask your doctor about a safe weight loss goal. General instructions Pay attention to any changes in your symptoms. Take over-the-counter and prescription medicines only as told by your doctor. Do not take aspirin, ibuprofen, or other NSAIDs unless your doctor says it is okay. Wear loose clothes. Do not wear anything tight around your waist. Raise (elevate) the head of your bed about 6 inches (15 cm). Avoid bending over if this makes your symptoms worse. Keep all follow-up visits as  told by your doctor. This is important. Contact a doctor if: You have new symptoms. You lose weight and you do not know why. You have trouble swallowing or it hurts to swallow. You have wheezing or a cough that keeps happening. Your symptoms do not get better with treatment. You have a hoarse voice. Get help right away if: You have pain in your arms, neck, jaw, teeth, or back. You feel sweaty, dizzy, or light-headed. You have chest pain or shortness of breath. You throw up (vomit) and your throw-up looks like blood or coffee grounds. You pass out (faint). Your poop (stool) is bloody or black. You cannot swallow, drink, or eat. Summary If a person has gastroesophageal reflux disease (GERD), food and stomach acid move back up into the esophagus and cause symptoms or problems such as damage to the esophagus. Treatment will depend on how bad your symptoms are. Follow a diet as told by your doctor. Take all medicines only as told by your doctor. This information is not intended to replace advice given to you by your health care provider. Make sure you discuss any questions you have with your health care provider. Document Released: 04/24/2008 Document Revised: 05/15/2018 Document Reviewed: 05/15/2018 Elsevier Patient Education  2020 [place] visit with [place] in 3 months  At patient's request I called Ross Ludwig,  husband, at [phone] to discuss results but no answer

## 2019-07-24 NOTE — Transfer of Care (Signed)
Immediate Anesthesia Transfer of Care Note  Patient: Katherine Larson  Procedure(s) Performed: ESOPHAGOGASTRODUODENOSCOPY (EGD) WITH PROPOFOL (N/A ) MALONEY DILATION (N/A )  Patient Location: PACU  Anesthesia Type:MAC  Level of Consciousness: awake, alert  and oriented  Airway & Oxygen Therapy: Patient Spontanous Breathing and Patient connected to nasal cannula oxygen  Post-op Assessment: Report given to RN and Post -op Vital signs reviewed and stable  Post vital signs: Reviewed and stable  Last Vitals:  Vitals Value Taken Time  BP    Temp    Pulse    Resp    SpO2      Last Pain:  Vitals:   07/24/19 1012  TempSrc:   PainSc: 0-No pain         Complications: No apparent anesthesia complications

## 2019-07-24 NOTE — Anesthesia Postprocedure Evaluation (Signed)
Anesthesia Post Note  Patient: Jilda Roche  Procedure(s) Performed: ESOPHAGOGASTRODUODENOSCOPY (EGD) WITH PROPOFOL (N/A ) MALONEY DILATION (N/A )  Patient location during evaluation: PACU Anesthesia Type: MAC Level of consciousness: awake and alert, oriented, patient cooperative and awake Pain management: pain level controlled Vital Signs Assessment: post-procedure vital signs reviewed and stable Respiratory status: spontaneous breathing, nonlabored ventilation, respiratory function stable and patient connected to nasal cannula oxygen Cardiovascular status: stable Postop Assessment: no apparent nausea or vomiting Anesthetic complications: no     Last Vitals:  Vitals:   07/24/19 0934 07/24/19 1026  BP: (!) 144/59 (!) (P) 126/45  Pulse: (!) 102 (P) 97  Resp: 14   Temp: 36.7 C (P) 36.8 C  SpO2: 96% (P) 97%    Last Pain:  Vitals:   07/24/19 1012  TempSrc:   PainSc: 0-No pain                 Kolyn Rozario

## 2019-07-24 NOTE — Anesthesia Preprocedure Evaluation (Signed)
Anesthesia Evaluation  Patient identified by MRN, date of birth, ID band Patient awake    Reviewed: Allergy & Precautions, NPO status , Patient's Chart, lab work & pertinent test results, reviewed documented beta blocker date and time   History of Anesthesia Complications (+) PONV  Airway Mallampati: II  TM Distance: >3 FB Neck ROM: Full    Dental no notable dental hx. (+) Edentulous Upper   Pulmonary shortness of breath and with exertion, asthma , pneumonia, resolved, COPD,  COPD inhaler, former smoker,    Pulmonary exam normal breath sounds clear to auscultation       Cardiovascular Exercise Tolerance: Poor hypertension, Pt. on medications and Pt. on home beta blockers + CAD, + Past MI and +CHF  Normal cardiovascular exam+ dysrhythmias II Rhythm:Regular Rate:Normal  LBBB Followed by cardiology last 12/2018 Denies recent CP or NTG use in over 1 year   Neuro/Psych  Headaches, negative psych ROS   GI/Hepatic Neg liver ROS, hiatal hernia, GERD  Medicated and Controlled,  Endo/Other  negative endocrine ROSdiabetes, Well Controlled, Type 2, Oral Hypoglycemic AgentsEuthyroid in 2018,  Renal/GU Renal InsufficiencyRenal disease  negative genitourinary   Musculoskeletal  (+) Arthritis , Osteoarthritis,  Has SCS-will turn off for procedure and turn on after   Abdominal   Peds negative pediatric ROS (+)  Hematology negative hematology ROS (+) anemia ,   Anesthesia Other Findings   Reproductive/Obstetrics negative OB ROS                             Anesthesia Physical Anesthesia Plan  ASA: IV  Anesthesia Plan: General   Post-op Pain Management:    Induction: Intravenous  PONV Risk Score and Plan: 4 or greater and Ondansetron, Propofol infusion, TIVA and Treatment may vary due to age or medical condition  Airway Management Planned: Nasal Cannula and Simple Face Mask  Additional Equipment:    Intra-op Plan:   Post-operative Plan:   Informed Consent: I have reviewed the patients History and Physical, chart, labs and discussed the procedure including the risks, benefits and alternatives for the proposed anesthesia with the patient or authorized representative who has indicated his/her understanding and acceptance.     Dental advisory given  Plan Discussed with: CRNA  Anesthesia Plan Comments: (Plan Full PPE use  Plan GA with GETA as needed d/w pt -WTP with same after Q&A)        Anesthesia Quick Evaluation

## 2019-07-24 NOTE — Op Note (Signed)
[facility] Patient Name: Katherine Larson Procedure Date: 07/24/2019 9:54 AM MRN: [id number] Date of Birth: [birth year] Attending MD: Norvel Richards , MD CSN: [id number] Age: 73 Admit Type: Outpatient Procedure:                Upper GI endoscopy Indications:              Dysphagia, Heartburn Providers:                Norvel Richards, MD, Jeanann Lewandowsky. Sharon Seller, RN,                            Aram Candela Referring MD:             Harlan Stains Medicines:                Propofol per Anesthesia Complications:            No immediate complications. Estimated Blood Loss:     Estimated blood loss: none. Procedure:                Pre-Anesthesia Assessment:                           - Prior to the procedure, a History and Physical                            was performed, and patient medications and                            allergies were reviewed. The patient's tolerance of                            previous anesthesia was also reviewed. The risks                            and benefits of the procedure and the sedation                            options and risks were discussed with the patient.                            All questions were answered, and informed consent                            was obtained. Prior Anticoagulants: The patient has                            taken no previous anticoagulant or antiplatelet                            agents. ASA Grade Assessment: II - A patient with                            mild systemic disease. After reviewing the risks  and benefits, the patient was deemed in                            satisfactory condition to undergo the procedure.                           After obtaining informed consent, the endoscope was                            passed under direct vision. Throughout the                            procedure, the patient's blood pressure, pulse, and                            oxygen  saturations were monitored continuously. The                            [id number]) scope was introduced through the                            mouth, and advanced to the second part of duodenum.                            The upper GI endoscopy was accomplished without                            difficulty. The patient tolerated the procedure                            well. Scope In: 10:17:09 AM Scope Out: 10:22:22 AM Total Procedure Duration: 0 hours 5 minutes 13 seconds  Findings:      The examined esophagus was normal.      A small hiatal hernia was present.      The duodenal bulb and second portion were examined. Normal except for a       large D2 diverticulum. The scope was withdrawn. Dilation was performed       with a Maloney dilator with mild resistance at 56 Fr. The dilation site       was examined following endoscope reinsertion and showed no change.       Estimated blood loss: none. Impression:               - Normal esophagus. Dilated.                           - Small hiatal hernia.                           - Normal duodenal bulb and second portion of the                            duodenum(duodenal diverticulum)                           - No specimens collected. Moderate  Sedation:      Moderate (conscious) sedation was personally administered by an       anesthesia professional. The following parameters were monitored: oxygen       saturation, heart rate, blood pressure, respiratory rate, EKG, adequacy       of pulmonary ventilation, and response to care. Recommendation:           - Patient has a contact number available for                            emergencies. The signs and symptoms of potential                            delayed complications were discussed with the                            patient. Return to normal activities tomorrow.                            Written discharge instructions were provided to the                            patient.                            - Resume previous diet.                           - Continue present medications. However, stop                            Nexium; begin Protonix 40 mg twice daily.                           - Return to GI office in 1 month. Procedure Code(s):        --- Professional ---                           [phone], Esophagogastroduodenoscopy, flexible,                            transoral; diagnostic, including collection of                            specimen(s) by brushing or washing, when performed                            (separate procedure)                           43450, Dilation of esophagus, by unguided sound or                            bougie, single or multiple passes Diagnosis Code(s):        --- Professional ---  K44.9, Diaphragmatic hernia without obstruction or                            gangrene                           R13.10, Dysphagia, unspecified                           R12, Heartburn CPT copyright 2019 American Medical Association. All rights reserved. The codes documented in this report are preliminary and upon coder review may  be revised to meet current compliance requirements. Cristopher Estimable. Gurjit Loconte, MD Norvel Richards, MD 07/24/2019 10:35:42 AM This report has been signed electronically. Number of Addenda: 0

## 2019-07-30 ENCOUNTER — Telehealth: Payer: Self-pay | Admitting: *Deleted

## 2019-07-30 ENCOUNTER — Encounter (HOSPITAL_COMMUNITY): Payer: Self-pay | Admitting: Internal Medicine

## 2019-07-30 MED ORDER — ONDANSETRON HCL 4 MG PO TABS: 4.0000 mg | ORAL_TABLET | Freq: Three times a day (TID) | ORAL | 0 refills | Status: DC

## 2019-07-30 NOTE — Telephone Encounter (Signed)
Recent switch in PPI to pantoprazole 40mg  BID, may be too soon to tell if this will make the difference. Make sure she is taking pantoprazole 30 minutes before a meal, breakfast and evening meal.   I would recommend she eat small meals/snacks 5-6 per day instead of large meals at one time.   She can use over the counter TUMS or Pepcid for breakthrough reflux symptoms per package label.   Needs follow up office visit in 4-6 weeks.

## 2019-07-30 NOTE — Telephone Encounter (Signed)
Hopefully pantoprazole will help the postprandial n/v, give a little more time.   Eating smaller amts of food at a time. Bland foods while we give time for the pantoprazole has time to work.   Can use Zofran 4mg  before meals. RX sent to pharmacy.

## 2019-07-30 NOTE — Telephone Encounter (Signed)
Per patient advice sent in by patient: I'm still having reflux and nasua with no appetite at all, also constantly burping, how long should this last or is this normal?    Thanks. Katherine Larson

## 2019-07-30 NOTE — Addendum Note (Signed)
Addended by: Mahala Menghini on: 07/30/2019 09:35 PM   Modules accepted: Orders

## 2019-07-30 NOTE — Telephone Encounter (Signed)
Spoke with pt. Pt's EGD was 07/24/2019. Pt was changed from Omeprazole to Pantoprazole 40 mg bid. Pt hasn't had much of an appetite since her EGD. She decided to eat a hamburger on 07/28/2019 and all of it came back up. Pt is belching a lot and has some burning sensations in her chest. Pt also reports some dizziness, nausea and diarrhea that she feels is coming from starting Metformin again. Pt has an appointment with her PCP this Friday 08/01/2019 to address those symptoms.

## 2019-07-30 NOTE — Telephone Encounter (Signed)
Pt notified of LSL recommendations. Pt wants to know if there is anything she can do for the nausea/vomiting after meals?  Please make a 4-6 weeks apt.

## 2019-07-31 ENCOUNTER — Encounter: Payer: Self-pay | Admitting: Internal Medicine

## 2019-07-31 NOTE — Telephone Encounter (Signed)
Pt is aware of LSL's recommendations.

## 2019-07-31 NOTE — Telephone Encounter (Signed)
PATIENT SCHEDULED  °

## 2019-08-05 ENCOUNTER — Other Ambulatory Visit (HOSPITAL_COMMUNITY): Payer: Medicare Other

## 2019-08-25 ENCOUNTER — Other Ambulatory Visit: Payer: Self-pay | Admitting: Family Medicine

## 2019-08-25 ENCOUNTER — Other Ambulatory Visit (HOSPITAL_COMMUNITY): Payer: Self-pay | Admitting: Family Medicine

## 2019-08-25 DIAGNOSIS — M7989 Other specified soft tissue disorders: Secondary | ICD-10-CM

## 2019-08-29 ENCOUNTER — Other Ambulatory Visit: Payer: Self-pay

## 2019-08-29 ENCOUNTER — Ambulatory Visit (HOSPITAL_COMMUNITY)
Admission: RE | Admit: 2019-08-29 | Discharge: 2019-08-29 | Disposition: A | Discharge status: Home / Self Care | Payer: Medicare Other | Source: Ambulatory Visit | Attending: Family Medicine | Admitting: Family Medicine

## 2019-08-29 DIAGNOSIS — M7989 Other specified soft tissue disorders: Secondary | ICD-10-CM | POA: Diagnosis present

## 2019-09-02 ENCOUNTER — Other Ambulatory Visit: Payer: Self-pay

## 2019-09-02 ENCOUNTER — Encounter: Payer: Self-pay | Admitting: Gastroenterology

## 2019-09-02 ENCOUNTER — Ambulatory Visit: Payer: Medicare Other | Admitting: Gastroenterology

## 2019-09-02 VITALS — BP 127/73 | HR 96 | Temp 97.1°F | Ht 62.5 in | Wt 162.8 lb

## 2019-09-02 DIAGNOSIS — R1310 Dysphagia, unspecified: Secondary | ICD-10-CM | POA: Diagnosis not present

## 2019-09-02 DIAGNOSIS — K219 Gastro-esophageal reflux disease without esophagitis: Secondary | ICD-10-CM | POA: Diagnosis not present

## 2019-09-02 DIAGNOSIS — R1319 Other dysphagia: Secondary | ICD-10-CM

## 2019-09-02 MED ORDER — PANTOPRAZOLE SODIUM 40 MG PO TBEC: 40.0000 mg | DELAYED_RELEASE_TABLET | Freq: Two times a day (BID) | ORAL | 1 refills | Status: DC

## 2019-09-02 NOTE — Patient Instructions (Addendum)
1. Continue pantoprazole 40mg  twice daily before breakfast and evening meal. I sent RX to [facility]. 2. Call if you decide you want to pursue swallowing xray. Otherwise, be really careful to chew hard foods very thoroughly and use plenty of liquids to get food down.  3. We will see you back in six months.

## 2019-09-02 NOTE — Progress Notes (Signed)
Primary Care Physician: Harlan Stains, MD  Primary Gastroenterologist:    Chief Complaint  Patient presents with  . Dysphagia  . Gastroesophageal Reflux    improved    HPI: Katherine Larson is a 73 y.o. female here for follow-up.  She was seen back in July for dysphagia and reflux symptoms.  EGD on July 24, 2019 showed normal esophagus status post empiric dilation.  She had a large D2 diverticulum, small hiatal hernia.  Her omeprazole was switched to pantoprazole 40 mg twice daily at that time.  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  Remote work-up for IDA by Dr. Watt Climes. Colonoscopy in 2013, external/internal hemorrhoids, diverticulosis, hyperplastic polyps. Givens capsule 2014 for IDA with minimal gastritis.  EGD in 2013, one nonbleeding superficial gastric ulcer, medium size diverticulum in the area of the papilloma, hiatal hernia. Celiac serologies negative in November 2019.  Since we last saw the patient she underwent ultrasound for soft tissue mass of the neck on October 9.  Area of palpable concern in the left submandibular region appears to correspond to slightly enlarged left submandibular lymph node and favored to be reactive.  Reflux is better controlled on pantoprazole 40 mg twice daily.  Vomiting has resolved.  No abdominal pain.  She continues to have issues swallowing meat and bread.  Dilation helped some but did not resolve her problems 100%.  She feels like the food is slow going down.  Not necessarily getting stuck anymore.  Recently had some diarrhea in the setting of Metformin, has improved with decreased dose.  No melena or rectal bleeding.     Current Outpatient Medications  Medication Sig Dispense Refill  . acetaminophen (TYLENOL) 325 MG tablet Take 325 mg by mouth every 6 (six) hours as needed for mild pain.     Marland Kitchen albuterol (PROVENTIL HFA;VENTOLIN HFA) 108 (90 Base) MCG/ACT inhaler Take 2 puffs 3 times a  day and every 4 hours as needed. (Patient taking differently: Inhale into the lungs. Take 2 puffs 3 times a day and every 4 hours as needed.)    . allopurinol (ZYLOPRIM) 300 MG tablet Take 300 mg by mouth daily.    Marland Kitchen aspirin EC 81 MG EC tablet Take 1 tablet (81 mg total) by mouth daily. (Patient taking differently: Take 81 mg by mouth every morning. )    . atorvastatin (LIPITOR) 80 MG tablet Take 80 mg by mouth daily.    . carvedilol (COREG) 12.5 MG tablet TAKE 1 TABLET BY MOUTH TWO  TIMES DAILY (Patient taking differently: Take 12.5 mg by mouth daily. ) 180 tablet 3  . cholecalciferol (VITAMIN D3) 25 MCG (1000 UT) tablet Take 1,000 Units by mouth daily.    . fexofenadine (ALLEGRA) 180 MG tablet Take 180 mg by mouth daily.      . fluticasone (FLONASE) 50 MCG/ACT nasal spray Place 2 sprays into both nostrils daily as needed for allergies.     . furosemide (LASIX) 40 MG tablet Take 40 mg by mouth 2 (two) times daily.     Marland Kitchen gabapentin (NEURONTIN) 300 MG capsule Take 300 mg by mouth 3 (three) times daily.     Marland Kitchen HYDROcodone-acetaminophen (NORCO) 7.5-325 MG tablet Take 1 tablet by mouth every 6 (six) hours as needed for moderate pain.    . Lancets (ONETOUCH DELICA PLUS [id number]) [place]     . leflunomide (ARAVA) 20 MG tablet Take 20 mg by mouth daily.     Marland Kitchen  lisinopril (PRINIVIL,ZESTRIL) 5 MG tablet TAKE 1 TABLET BY MOUTH  DAILY 90 tablet 3  . MAGNESIUM-OXIDE 400 (241.3 Mg) MG tablet TAKE 1 TABLET BY MOUTH EVERY DAY 30 tablet 6  . metFORMIN (GLUCOPHAGE) 500 MG tablet Take 1,000 mg by mouth daily with breakfast.     . montelukast (SINGULAIR) 10 MG tablet Take 10 mg by mouth at bedtime.    . Multiple Vitamins-Minerals (MACULAR VITAMIN BENEFIT PO) Take 2 tablets by mouth daily.    . nitroGLYCERIN (NITROSTAT) 0.4 MG SL tablet Place 1 tablet (0.4 mg total) under the tongue every 5 (five) minutes as needed for chest pain. Reported on 03/24/2016 25 tablet prn  . ondansetron (ZOFRAN) 4 MG tablet Take 1 tablet (4 mg  total) by mouth 3 (three) times daily before meals. 90 tablet 0  . pantoprazole (PROTONIX) 40 MG tablet Take 40 mg by mouth 2 (two) times daily.    Vladimir Faster Glycol-Propyl Glycol (SYSTANE OP) Apply 1 drop to eye 3 (three) times daily as needed (Dry Eyes).    . potassium chloride SA (K-DUR,KLOR-CON) 20 MEQ tablet Take 1 tablet (20 mEq total) by mouth 2 (two) times daily. 40 tablet 11  . triamcinolone cream (KENALOG) 0.1 % Apply 1 application topically daily as needed (for irritation).      No current facility-administered medications for this visit.     Allergies as of 09/02/2019 - Review Complete 09/02/2019  Allergen Reaction Noted  . Biaxin [clarithromycin] Rash and Other (See Comments) 02/02/2010  . Penicillins Rash and Other (See Comments) 02/02/2010    ROS:  General: Negative for anorexia, weight loss, fever, chills, fatigue, weakness. ENT: Negative for hoarseness, difficulty swallowing , nasal congestion. CV: Negative for chest pain, angina, palpitations, dyspnea on exertion, peripheral edema.  Respiratory: Negative for dyspnea at rest, dyspnea on exertion, cough, sputum, wheezing.  GI: See history of present illness. GU:  Negative for dysuria, hematuria, urinary incontinence, urinary frequency, nocturnal urination.  Endo: Negative for unusual weight change.    Physical Examination:   BP 127/73   Pulse 96   Temp (!) 97.1 F (36.2 C) (Oral)   Ht 5' 2.5" (1.588 m)   Wt 162 lb 12.8 oz (73.8 kg)   BMI 29.30 kg/m   General: Well-nourished, well-developed in no acute distress.  Eyes: No icterus. Mouth: Oropharyngeal mucosa moist and pink , no lesions erythema or exudate. Lungs: Clear to auscultation bilaterally.  Heart: Regular rate and rhythm, no murmurs rubs or gallops.  Abdomen: Bowel sounds are normal, nontender, nondistended, no hepatosplenomegaly or masses, no abdominal bruits or hernia , no rebound or guarding.   Extremities: No lower extremity edema. No clubbing or  deformities. Neuro: Alert and oriented x 4   Skin: Warm and dry, no jaundice.   Psych: Alert and cooperative, normal mood and affect.   Imaging Studies: US Soft Tissue Head & Neck (non-thyroid)  Result Date: 08/29/2019 CLINICAL DATA:  Mass of soft tissue of neck EXAM: ULTRASOUND OF HEAD/NECK SOFT TISSUES TECHNIQUE: Ultrasound examination of the head and neck soft tissues was performed in the area of clinical concern. COMPARISON:  None. FINDINGS: In the patient's palpable area of concern in the left submandibular region, there is a soft tissue nodule measuring 1 x 0.9 x 0.9 cm. This nodule appears to demonstrate a fatty hilum and is favored to represent a mildly enlarged lymph node. IMPRESSION: The patient's palpable area of concern in the left submandibular region appears to correspond to a slightly enlarged left submandibular  lymph node. This lymph node appears to demonstrate a normal fatty hilum and is favored to be reactive. If there is continued interval growth of this lymph node, short-term follow-up with ultrasound or CT is recommended. Electronically Signed   By: Constance Holster M.D.   On: 08/29/2019 19:50

## 2019-09-02 NOTE — Assessment & Plan Note (Signed)
Typical reflux better controlled on pantoprazole 40 mg twice daily.  Having some solid food dysphagia to bread and meat, sensation of food going down slowly.  Dilation helped some but not completely.  Question underlying motility issues.  Offered barium esophagram as next step to evaluate.  She would like to postpone for now given recent work-up neck mass.  She will call and let me know if she changes Remeron.  Otherwise we will see her back in 6 months.

## 2019-09-18 ENCOUNTER — Telehealth: Payer: Self-pay | Admitting: Internal Medicine

## 2019-09-18 DIAGNOSIS — R1310 Dysphagia, unspecified: Secondary | ICD-10-CM

## 2019-09-18 NOTE — Telephone Encounter (Signed)
Called pt, food and pills feel like they're not going down like they should. No problem with liquids.  Routing to [facility] for advice.

## 2019-09-18 NOTE — Telephone Encounter (Signed)
BPE scheduled for 09/25/19 at 11:00am, arrive at 10:45am. NPO 3 hours prior to test. Called and informed pt of appt. Letter mailed.

## 2019-09-18 NOTE — Telephone Encounter (Signed)
Ok to schedule BPE for dysphagia

## 2019-09-18 NOTE — Addendum Note (Signed)
Addended by: Hassan Rowan on: 09/18/2019 01:13 PM   Modules accepted: Orders

## 2019-09-18 NOTE — Telephone Encounter (Signed)
Pt said when she saw [facility] a few weeks ago that she was told if she wasn't feeling any better to call the office and we would schedule her a barium. Please advise. [phone]

## 2019-09-25 ENCOUNTER — Other Ambulatory Visit (HOSPITAL_COMMUNITY): Payer: Medicare Other

## 2019-09-26 ENCOUNTER — Other Ambulatory Visit: Payer: Self-pay

## 2019-09-26 ENCOUNTER — Ambulatory Visit (HOSPITAL_COMMUNITY)
Admission: RE | Admit: 2019-09-26 | Discharge: 2019-09-26 | Disposition: A | Discharge status: Home / Self Care | Payer: Medicare Other | Source: Ambulatory Visit | Attending: Gastroenterology | Admitting: Gastroenterology

## 2019-09-26 DIAGNOSIS — R1310 Dysphagia, unspecified: Secondary | ICD-10-CM

## 2019-10-02 ENCOUNTER — Telehealth: Payer: Self-pay | Admitting: Gastroenterology

## 2019-10-02 NOTE — Telephone Encounter (Signed)
Can we request last cbc, iron labs from PCP, likely done in the last 3 months.?

## 2019-10-03 NOTE — Telephone Encounter (Signed)
requested

## 2019-10-08 ENCOUNTER — Other Ambulatory Visit: Payer: Self-pay | Admitting: Gastroenterology

## 2019-10-08 MED ORDER — RABEPRAZOLE SODIUM 20 MG PO TBEC: 20.0000 mg | DELAYED_RELEASE_TABLET | Freq: Two times a day (BID) | ORAL | 2 refills | Status: DC

## 2019-10-10 ENCOUNTER — Other Ambulatory Visit: Payer: Self-pay | Admitting: Family Medicine

## 2019-10-10 ENCOUNTER — Ambulatory Visit
Admission: RE | Admit: 2019-10-10 | Discharge: 2019-10-10 | Disposition: A | Discharge status: Home / Self Care | Payer: Medicare Other | Source: Ambulatory Visit | Attending: Family Medicine | Admitting: Family Medicine

## 2019-10-10 DIAGNOSIS — R591 Generalized enlarged lymph nodes: Secondary | ICD-10-CM

## 2019-10-28 ENCOUNTER — Other Ambulatory Visit: Payer: Self-pay

## 2019-10-28 MED ORDER — MAGNESIUM OXIDE 400 (241.3 MG) MG PO TABS: 1.0000 | ORAL_TABLET | Freq: Every day | ORAL | 6 refills | Status: DC

## 2019-10-28 NOTE — Telephone Encounter (Signed)
Refilled magnesium

## 2019-10-29 ENCOUNTER — Ambulatory Visit: Payer: Medicare Other | Admitting: Gastroenterology

## 2019-11-26 ENCOUNTER — Ambulatory Visit (HOSPITAL_COMMUNITY)
Admission: RE | Admit: 2019-11-26 | Discharge: 2019-11-26 | Disposition: A | Discharge status: Home / Self Care | Payer: Medicare Other | Source: Ambulatory Visit | Attending: Neurosurgery | Admitting: Neurosurgery

## 2019-11-26 ENCOUNTER — Other Ambulatory Visit (HOSPITAL_COMMUNITY): Payer: Self-pay | Admitting: Neurosurgery

## 2019-11-26 ENCOUNTER — Other Ambulatory Visit: Payer: Self-pay

## 2019-11-26 DIAGNOSIS — M542 Cervicalgia: Secondary | ICD-10-CM | POA: Diagnosis not present

## 2020-01-30 ENCOUNTER — Other Ambulatory Visit: Payer: Self-pay | Admitting: Family Medicine

## 2020-01-30 ENCOUNTER — Other Ambulatory Visit: Payer: Self-pay

## 2020-01-30 ENCOUNTER — Encounter: Payer: Self-pay | Admitting: Gastroenterology

## 2020-01-30 ENCOUNTER — Ambulatory Visit
Admission: RE | Admit: 2020-01-30 | Discharge: 2020-01-30 | Disposition: A | Discharge status: Home / Self Care | Payer: Medicare Other | Source: Ambulatory Visit | Attending: Family Medicine | Admitting: Family Medicine

## 2020-01-30 DIAGNOSIS — R634 Abnormal weight loss: Secondary | ICD-10-CM

## 2020-02-05 ENCOUNTER — Other Ambulatory Visit (HOSPITAL_COMMUNITY): Payer: Self-pay | Admitting: Family Medicine

## 2020-02-05 ENCOUNTER — Other Ambulatory Visit: Payer: Self-pay | Admitting: Family Medicine

## 2020-02-05 DIAGNOSIS — R634 Abnormal weight loss: Secondary | ICD-10-CM

## 2020-02-17 NOTE — Progress Notes (Signed)
Cardiology Office Note    Date:  02/20/2020  ID:  Katherine Larson December 28, 1945, MRN [id number] PCP:  [name]  Cardiologist:  Dr. [name] Cancel Linna Hoff), EP - Dr. [name]   Chief Complaint: CABG   History of Present Illness:    74 y.o. CABG with LIMA 1996 Most recent EF 45-50% by TTE 01/19/17  NSVT, PAT, LBBB Hyperthyroidism  Activity limited by chronic back pain. Seen a lot by GT for evaluation AICD not needed. Tends to be tachycardic    Had syncopal spell June 2017 event monitor showed no arrhythmia Cath 05/01/16 patent LIMA normal right heart pressures No significant circumflex or RCA disease  No chest pain some LE edema and weight gain  Has spinal stimulator for her back  GI issues with GERD/Dysphagia EGD 07/24/19 with empiric dilatation on protonix   Having some episodes of dyspnea chest tightness and dyspnea Some wake her at night BS ok during Episodes   Past Medical History:  Diagnosis Date  . Anemia    anemia of chronic disease +/- IDA followed by Dr. Earlie Server. received iron infusions in the past  . Asthma   . Borderline glaucoma   . CAD (coronary artery disease)    a. s/p CABG 1996. b. low risk nuc 2011. c. LHC 04/2016 due to drop in EF -> occluded native LAD,  widely patent sequential LIMA-D1-LAD.  Marland Kitchen Chronic kidney disease    "mild stage of kidney disease"  . Chronic systolic CHF (congestive heart failure) ([place])   . Complication of anesthesia   . DM2 (diabetes mellitus, type 2) ([place])    not on any medicine for this at this time, 05/2016  . GERD (gastroesophageal reflux disease)   . Gout   . History of hiatal hernia    in 31s  . History of pneumonia    March, 2016  . HLD (hyperlipidemia)   . HTN (hypertension)   . Hyperthyroidism 01/21/2016  . Inappropriate sinus tachycardia   . LBBB (left bundle branch block)    a. Seen in 04/2016  . Macular degeneration    left  . Myocardial infarction ([place])    1996  . Neuropathy   . NICM  (nonischemic cardiomyopathy) ([place])    a. Dx 04/2016 - EF 25-30%, diffuse HK, elevated LVEDP, mild MR, mod LAE.  Marland Kitchen NSVT (nonsustained ventricular tachycardia) ([place])   . PAT (paroxysmal atrial tachycardia) ([place])   . PONV (postoperative nausea and vomiting)    "after just about every surgery I've had"  . Rheumatoid arthritis (H[facility])    RA  . Seasonal allergies     Past Surgical History:  Procedure Laterality Date  . ABDOMINAL HYSTERECTOMY    . APPENDECTOMY    . BACK SURGERY    . CARDIAC CATHETERIZATION N/A 05/01/2016   Procedure: Right/Left Heart Cath and Coronary/Graft Angiography;  Surgeon: Leonie Man, MD;  Location: [facility];  Service: Cardiovascular;  Laterality: N/A;  . CHOLECYSTECTOMY    . COLONOSCOPY  11/2011   Dr. Magod:ext/int hemorrhoids, diverticulosis sigmoid colon and distal desc colon, mid-desc colon hyperplastic polyps.   . CORONARY ARTERY BYPASS GRAFT  1996   2 vessels  . ESOPHAGOGASTRODUODENOSCOPY  11/2011   Dr. Watt Climes: small hiatal hernia, one non-bleeding superficial gastric ulcer, medium-sized diverticulum in area of papilla  . ESOPHAGOGASTRODUODENOSCOPY N/A 12/29/2015   Dr. Gala Romney: Chronic inactive gastritis, normal esophagus status post dilation, duodenal diverticula  . ESOPHAGOGASTRODUODENOSCOPY (EGD) WITH PROPOFOL N/A 07/24/2019   Dr.  Rourk: Normal esophagus status post empiric dilation, small hiatal hernia, large D2 diverticulum  . EYE SURGERY Bilateral    cataract removal  . FOOT SURGERY     removal bone spur  . FRACTURE SURGERY Right    arm  . GIVENS CAPSULE STUDY  11/2012   Dr. Watt Climes: minimal gastritis, normal small bowel capsule  . HERNIA REPAIR     hiatal hernia  . MALONEY DILATION N/A 07/24/2019   Procedure: Venia Minks DILATION;  Surgeon: Daneil Dolin, MD;  [facility];  Service: Endoscopy;  Laterality: N/A;  . MAXIMUM ACCESS (MAS)POSTERIOR LUMBAR INTERBODY FUSION (PLIF) 1 LEVEL N/A 08/14/2013   Procedure:  MAXIMUM ACCESS SURGERY(MAS)  POSTERIOR LUMBAR INTERBODY FUSION LUMBAR THREE-FOUR ;  Surgeon: Eustace Moore, MD;  Location: [facility];  Service: Neurosurgery;  Laterality: N/A;   MAXIMUM ACCESS SURGERY(MAS) POSTERIOR LUMBAR INTERBODY FUSION LUMBAR THREE-FOUR   . PTCA    . SPINAL CORD STIMULATOR INSERTION N/A 06/16/2016   Procedure: LUMBAR SPINAL CORD STIMULATOR INSERTION;  Surgeon: Clydell Hakim, MD;  Location: [facility];  Service: Neurosurgery;  Laterality: N/A;  LUMBAR SPINAL CORD STIMULATOR INSERTION  . tens  05/2016  . TONSILLECTOMY      Current Medications: Outpatient Medications Prior to Visit  Medication Sig Dispense Refill  . acetaminophen (TYLENOL) 325 MG tablet Take 325 mg by mouth every 6 (six) hours as needed for mild pain.     Marland Kitchen albuterol (PROVENTIL HFA;VENTOLIN HFA) 108 (90 Base) MCG/ACT inhaler Take 2 puffs 3 times a day and every 4 hours as needed. (Patient taking differently: Inhale into the lungs. Take 2 puffs 3 times a day and every 4 hours as needed.)    . allopurinol (ZYLOPRIM) 300 MG tablet Take 300 mg by mouth daily.    Marland Kitchen aspirin EC 81 MG EC tablet Take 1 tablet (81 mg total) by mouth daily. (Patient taking differently: Take 81 mg by mouth every morning. )    . atorvastatin (LIPITOR) 80 MG tablet Take 80 mg by mouth daily.    . carvedilol (COREG) 12.5 MG tablet TAKE 1 TABLET BY MOUTH TWO  TIMES DAILY (Patient taking differently: Take 12.5 mg by mouth daily. ) 180 tablet 3  . cholecalciferol (VITAMIN D3) 25 MCG (1000 UT) tablet Take 1,000 Units by mouth daily.    . fexofenadine (ALLEGRA) 180 MG tablet Take 180 mg by mouth daily.      . fluticasone (FLONASE) 50 MCG/ACT nasal spray Place 2 sprays into both nostrils daily as needed for allergies.     . furosemide (LASIX) 40 MG tablet Take 40 mg by mouth 2 (two) times daily.     Marland Kitchen gabapentin (NEURONTIN) 300 MG capsule Take 300 mg by mouth 3 (three) times daily.     Marland Kitchen HYDROcodone-acetaminophen (NORCO) 7.5-325 MG tablet Take 1 tablet by mouth every 6  (six) hours as needed for moderate pain.    . Lancets (ONETOUCH DELICA PLUS 123XX123) [place]     . leflunomide (ARAVA) 20 MG tablet Take 20 mg by mouth daily.     Marland Kitchen lisinopril (PRINIVIL,ZESTRIL) 5 MG tablet TAKE 1 TABLET BY MOUTH  DAILY 90 tablet 3  . magnesium oxide (MAGNESIUM-OXIDE) 400 (241.3 Mg) MG tablet Take 1 tablet (400 mg total) by mouth daily. 30 tablet 6  . metFORMIN (GLUCOPHAGE) 500 MG tablet Take 1,000 mg by mouth daily with breakfast.     . montelukast (SINGULAIR) 10 MG tablet Take 10 mg by mouth at bedtime.    Marland Kitchen  Multiple Vitamins-Minerals (MACULAR VITAMIN BENEFIT PO) Take 2 tablets by mouth daily.    . nitroGLYCERIN (NITROSTAT) 0.4 MG SL tablet Place 1 tablet (0.4 mg total) under the tongue every 5 (five) minutes as needed for chest pain. Reported on 03/24/2016 25 tablet prn  . ondansetron (ZOFRAN) 4 MG tablet Take 1 tablet (4 mg total) by mouth 3 (three) times daily before meals. 90 tablet 0  . pantoprazole (PROTONIX) 40 MG tablet Take 1 tablet (40 mg total) by mouth 2 (two) times daily before a meal. 180 tablet 1  . Polyethyl Glycol-Propyl Glycol (SYSTANE OP) Apply 1 drop to eye 3 (three) times daily as needed (Dry Eyes).    . potassium chloride SA (K-DUR,KLOR-CON) 20 MEQ tablet Take 1 tablet (20 mEq total) by mouth 2 (two) times daily. 40 tablet 11  . RABEprazole (ACIPHEX) 20 MG tablet Take 1 tablet (20 mg total) by mouth 2 (two) times daily before a meal. 60 tablet 2  . triamcinolone cream (KENALOG) 0.1 % Apply 1 application topically daily as needed (for irritation).      No facility-administered medications prior to visit.     Allergies:   Biaxin [clarithromycin] and Penicillins   Social History   Socioeconomic History  . Marital status: Married    Spouse name: Not on file  . Number of children: 3  . Years of education: Not on file  . Highest education level: Not on file  Occupational History  . Not on file  Tobacco Use  . Smoking status: Former Smoker    Packs/day:  0.75    Years: 15.00    Pack years: 11.25    Types: Cigarettes    Quit date: 11/20/1994    Years since quitting: 25.2  . Smokeless tobacco: Never Used  . Tobacco comment: Quit in 1996  Substance and Sexual Activity  . Alcohol use: No    Alcohol/week: 0.0 standard drinks  . Drug use: No  . Sexual activity: Yes  Other Topics Concern  . Not on file  Social History Narrative   2 living children, one deceased age 26   Social Determinants of Health   Financial Resource Strain:   . Difficulty of Paying Living Expenses:   Food Insecurity:   . Worried About Charity fundraiser in the Last Year:   . Arboriculturist in the Last Year:   Transportation Needs:   . Film/video editor (Medical):   Marland Kitchen Lack of Transportation (Non-Medical):   Physical Activity:   . Days of Exercise per Week:   . Minutes of Exercise per Session:   Stress:   . Feeling of Stress :   Social Connections:   . Frequency of Communication with Friends and Family:   . Frequency of Social Gatherings with Friends and Family:   . Attends Religious Services:   . Active Member of Clubs or Organizations:   . Attends Archivist Meetings:   Marland Kitchen Marital Status:      Family History:  The patient's family history includes Bladder Cancer in her brother; Cervical cancer in her daughter; Heart attack in her father and mother.   ROS:   Please see the history of present illness.  All other systems are reviewed and otherwise negative.    PHYSICAL EXAM:   VS:  BP 128/66   Pulse 92   Temp 98 F (36.7 C)   Ht 5' 2.5" (1.588 m)   Wt 146 lb (66.2 kg)   SpO2 99%  BMI 26.28 kg/m   BMI: Body mass index is 26.28 kg/m. Affect appropriate Healthy:  appears stated age 66: normal Neck supple with no adenopathy JVP normal no bruits no thyromegaly Lungs clear with no wheezing and good diaphragmatic motion Heart:  S1/S2 no murmur, no rub, gallop or click PMI normal post sternotomy  Abdomen: benighn, BS positve, no  tenderness, no AAA no bruit.  No HSM or HJR Distal pulses intact with no bruits No edema Neuro non-focal Skin warm and dry No muscular weakness   Wt Readings from Last 3 Encounters:  02/20/20 146 lb (66.2 kg)  09/02/19 162 lb 12.8 oz (73.8 kg)  07/24/19 163 lb 9.6 oz (74.2 kg)      Studies/Labs Reviewed:   EKG:  SR LBBB chronic no acute changes   Recent Labs: 07/22/2019: BUN 17; Potassium 4.4; Sodium 135 02/18/2020: Creatinine, Ser 1.00   Lipid Panel    Component Value Date/Time   CHOL 112 12/19/2007 0910   TRIG 177 (H) 12/19/2007 0910   HDL 30.2 (L) 12/19/2007 0910   CHOLHDL 3.7 CALC 12/19/2007 0910   VLDL 35 12/19/2007 0910   LDLCALC 46 12/19/2007 0910    Additional studies/ records that were reviewed today include:  Echo:  01/19/17 EF 45-50% trivial MR AV sclerosis      ASSESSMENT & PLAN:   1. CHF: echo 01/19/17 EF 45-50%  Continue lasix,ACE,Coreg will update echo since it's been 3 years  2. Paroxysmal atrial tach - resolved medical Rx  3. CAD - continue ASA, BB, statin for now. Cath June 2017  as above.patent LIMA episodes of tightness in chest and dyspnea will check myovue  4. NSVT - continue beta blocker Seen by GT given improved EF on medical Rx no need for AICD  5. HTN:  Well controlled.  Continue current medications and low sodium Dash type diet.   6. DM:  Discussed low carb diet.  Target hemoglobin A1c is 6.5 or less.  Continue current medications. 7. Chol:  F/u labs with primary continue statin  8. Ortho spine stimulator working well  9. GI:  GERD dysphagia f/u Magod post dilatation continue protonix    Echo for DCM/CAD/CHF Myovue : CABG/Chest Tightness  F/U in a year if EF stable   [employer]

## 2020-02-18 ENCOUNTER — Ambulatory Visit (HOSPITAL_COMMUNITY)
Admission: RE | Admit: 2020-02-18 | Discharge: 2020-02-18 | Disposition: A | Discharge status: Home / Self Care | Payer: Medicare Other | Source: Ambulatory Visit | Attending: Family Medicine | Admitting: Family Medicine

## 2020-02-18 ENCOUNTER — Other Ambulatory Visit: Payer: Self-pay

## 2020-02-18 DIAGNOSIS — R634 Abnormal weight loss: Secondary | ICD-10-CM | POA: Diagnosis not present

## 2020-02-18 LAB — POCT I-STAT CREATININE: Creatinine, Ser: 1 mg/dL (ref 0.44–1.00)

## 2020-02-18 MED ORDER — IOHEXOL 300 MG/ML  SOLN
100.0000 mL | Freq: Once | INTRAMUSCULAR | Status: AC | PRN
  Administered 2020-02-18: 100 mL via INTRAVENOUS

## 2020-02-20 ENCOUNTER — Ambulatory Visit: Payer: Medicare Other | Admitting: Cardiovascular Disease

## 2020-02-20 ENCOUNTER — Encounter: Payer: Self-pay | Admitting: Cardiovascular Disease

## 2020-02-20 ENCOUNTER — Other Ambulatory Visit: Payer: Self-pay

## 2020-02-20 VITALS — BP 128/66 | HR 92 | Temp 98.0°F | Ht 62.5 in | Wt 146.0 lb

## 2020-02-20 DIAGNOSIS — Z951 Presence of aortocoronary bypass graft: Secondary | ICD-10-CM | POA: Diagnosis not present

## 2020-02-20 DIAGNOSIS — R079 Chest pain, unspecified: Secondary | ICD-10-CM

## 2020-02-20 DIAGNOSIS — R55 Syncope and collapse: Secondary | ICD-10-CM | POA: Diagnosis not present

## 2020-02-20 DIAGNOSIS — R0602 Shortness of breath: Secondary | ICD-10-CM | POA: Diagnosis not present

## 2020-02-20 NOTE — Patient Instructions (Signed)
Medication Instructions:  Your physician recommends that you continue on your current medications as directed. Please refer to the Current Medication list given to you today.   Labwork: NONE  Testing/Procedures: Your physician has requested that you have an echocardiogram. Echocardiography is a painless test that uses sound waves to create images of your heart. It provides your doctor with information about the size and shape of your heart and how well your heart's chambers and valves are working. This procedure takes approximately one hour. There are no restrictions for this procedure.  Your physician has requested that you have a lexiscan myoview. For further information please visit [email or web address]. Please follow instruction sheet, as given.    Follow-Up: Your physician wants you to follow-up in:  6 MONTHS.  You will receive a reminder letter in the mail two months in advance. If you don't receive a letter, please call our office to schedule the follow-up appointment.   Any Other Special Instructions Will Be Listed Below (If Applicable).     If you need a refill on your cardiac medications before your next appointment, please call your pharmacy.   

## 2020-03-02 ENCOUNTER — Other Ambulatory Visit: Payer: Self-pay

## 2020-03-02 ENCOUNTER — Encounter: Payer: Self-pay | Admitting: *Deleted

## 2020-03-02 ENCOUNTER — Other Ambulatory Visit (HOSPITAL_COMMUNITY): Payer: Self-pay | Admitting: Family Medicine

## 2020-03-02 ENCOUNTER — Telehealth: Payer: Self-pay | Admitting: Gastroenterology

## 2020-03-02 ENCOUNTER — Encounter: Payer: Self-pay | Admitting: Gastroenterology

## 2020-03-02 ENCOUNTER — Ambulatory Visit: Payer: Medicare Other | Admitting: Gastroenterology

## 2020-03-02 ENCOUNTER — Other Ambulatory Visit: Payer: Self-pay | Admitting: Family Medicine

## 2020-03-02 VITALS — BP 100/58 | HR 91 | Temp 97.1°F | Ht 62.5 in | Wt 142.8 lb

## 2020-03-02 DIAGNOSIS — K219 Gastro-esophageal reflux disease without esophagitis: Secondary | ICD-10-CM | POA: Diagnosis not present

## 2020-03-02 DIAGNOSIS — R634 Abnormal weight loss: Secondary | ICD-10-CM

## 2020-03-02 DIAGNOSIS — R1310 Dysphagia, unspecified: Secondary | ICD-10-CM

## 2020-03-02 DIAGNOSIS — R911 Solitary pulmonary nodule: Secondary | ICD-10-CM

## 2020-03-02 DIAGNOSIS — K529 Noninfective gastroenteritis and colitis, unspecified: Secondary | ICD-10-CM | POA: Insufficient documentation

## 2020-03-02 DIAGNOSIS — R1319 Other dysphagia: Secondary | ICD-10-CM

## 2020-03-02 MED ORDER — NA SULFATE-K SULFATE-MG SULF 17.5-3.13-1.6 GM/177ML PO SOLN: 1.0000 | Freq: Once | ORAL | 0 refills | Status: AC

## 2020-03-02 MED ORDER — CHOLESTYRAMINE 4 GM/DOSE PO POWD: 4.0000 g | Freq: Every day | ORAL | 1 refills | Status: DC

## 2020-03-02 NOTE — Telephone Encounter (Signed)
Opened in error

## 2020-03-02 NOTE — Progress Notes (Signed)
Primary Care Physician: [name]  Primary Gastroenterologist:  Garfield Cornea, MD   Chief Complaint  Patient presents with  . Dysphagia    trouble with meats and certain veggies  . Gastroesophageal Reflux    will have to take tums about 1 hours fater taking reflux medication  . change in bowels    very watery stools daily    HPI: Katherine Larson is a 74 y.o. female here for follow up. Last seen in 08/2019. H/o dysphagia, GERD, diarrhea.   EGD on July 24, 2019 showed normal esophagus status post empiric dilation.  She had a large D2 diverticulum, small hiatal hernia.  Her omeprazole was switched to pantoprazole 40 mg twice daily at that time.  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  Remote work-up for IDA by Dr. Watt Climes. Colonoscopy in 2013,external/internal hemorrhoids, diverticulosis, hyperplastic polyps. Givens capsule 2014 for IDA with minimal gastritis. EGD in 2013, one nonbleeding superficial gastric ulcer, medium size diverticulum in the area of the papilloma, hiatal hernia. Celiac serologies negative in November 2019.  Barium pill esophagram November 2020 with moderate esophageal dysmotility.  Poor clearance by primary peristaltic waves.  Numerous secondary and tertiary waves.  Prolonged thoracic retention of contrast.  Laryngeal penetration to the level of vocal cords without aspiration or cough.  Prominent GERD to the level of clavicular heads.  Barium tablet passed without difficulty.  We stopped pantoprazole twice daily and started AcipHex twice daily but too expensive.  CT abdomen pelvis with contrast February 18, 2020: Extensive left-sided colonic diverticulosis without diverticulitis.  Nothing to explain patient's abdominal pain and diarrhea.  Able to swallow hamburger, chicken tenders, fish. Harder meats she cannot get down.sometimes pills get in the corner of her throat and don't want to  go down, happens with smaller pills too. Dissolve and burn when this happens. Currently back on Pantoprazole bid. Uses TUMS at times, depending on meals she may have nocturnal heartburn. Eats last time at 4-5 pm. Off reglan for few years. Some daytime heartburn too. No n/v. No abdominal pain. Continues to have diarrhea. Incontinent at times. Takes two imodium before leaving the house each day. Stools are watery. Only has a solid stool if loads up on imodium. Symptoms ever since her gb surgery in the 70s. Worse postprandially. Will avoid eating out etc. Will have multiple stools after meals. Some nocturnal stools. No melena, brbpr. Never tried levsin,bentyl, or questran.  Prevaicd, omeprazole, aciphex. Has tried prevacid, omeprazole, aciphex without good relief of heartburn. Does not think she has tried Dexilant or Nexium. Right now she has so much of pantoprazole wants to use all. Will call when low and consider switching PPI. Not interested in esophageal manometry or modified barium swallow study right now due to multiple other pending issues. Echo and another heart thing end of month. May consider after gets all that done, and husband better.   Six mm nodule in lung noted on CT, primary care planning follow up chest ct.    Current Outpatient Medications  Medication Sig Dispense Refill  . albuterol (PROVENTIL HFA;VENTOLIN HFA) 108 (90 Base) MCG/ACT inhaler Take 2 puffs 3 times a day and every 4 hours as needed. (Patient taking differently: Inhale into the lungs. Take 2 puffs 3 times a day and every 4 hours as needed.)    . allopurinol (ZYLOPRIM) 300 MG tablet Take 300 mg by mouth daily.    Marland Kitchen aspirin  EC 81 MG EC tablet Take 1 tablet (81 mg total) by mouth daily. (Patient taking differently: Take 81 mg by mouth every morning. )    . atorvastatin (LIPITOR) 80 MG tablet Take 80 mg by mouth daily.    . calcium carbonate (TUMS - DOSED IN MG ELEMENTAL CALCIUM) 500 MG chewable tablet Chew 1 tablet by mouth as  needed for indigestion or heartburn.    . carvedilol (COREG) 12.5 MG tablet TAKE 1 TABLET BY MOUTH TWO  TIMES DAILY (Patient taking differently: Take 12.5 mg by mouth 2 (two) times daily with a meal. ) 180 tablet 3  . cholecalciferol (VITAMIN D3) 25 MCG (1000 UT) tablet Take 1,000 Units by mouth daily.    . fexofenadine (ALLEGRA) 180 MG tablet Take 180 mg by mouth daily.      . fluticasone (FLONASE) 50 MCG/ACT nasal spray Place 2 sprays into both nostrils daily as needed for allergies.     . furosemide (LASIX) 40 MG tablet Take 40 mg by mouth 2 (two) times daily.     Marland Kitchen gabapentin (NEURONTIN) 300 MG capsule Take 300 mg by mouth 3 (three) times daily.     Marland Kitchen HYDROcodone-acetaminophen (NORCO) 7.5-325 MG tablet Take 1 tablet by mouth every 6 (six) hours as needed for moderate pain.    . Lancets (ONETOUCH DELICA PLUS [id number]) [place]     . lisinopril (PRINIVIL,ZESTRIL) 5 MG tablet TAKE 1 TABLET BY MOUTH  DAILY 90 tablet 3  . loperamide (IMODIUM A-D) 2 MG tablet Take 2 mg by mouth 4 (four) times daily as needed for diarrhea or loose stools.    . magnesium oxide (MAGNESIUM-OXIDE) 400 (241.3 Mg) MG tablet Take 1 tablet (400 mg total) by mouth daily. 30 tablet 6  . metFORMIN (GLUCOPHAGE) 500 MG tablet Take 1,000 mg by mouth. At night    . montelukast (SINGULAIR) 10 MG tablet Take 10 mg by mouth at bedtime.    . Multiple Vitamins-Minerals (MACULAR VITAMIN BENEFIT PO) Take 2 tablets by mouth daily.    . nitroGLYCERIN (NITROSTAT) 0.4 MG SL tablet Place 1 tablet (0.4 mg total) under the tongue every 5 (five) minutes as needed for chest pain. Reported on 03/24/2016 25 tablet prn  . pantoprazole (PROTONIX) 40 MG tablet Take 1 tablet (40 mg total) by mouth 2 (two) times daily before a meal. 180 tablet 1  . Polyethyl Glycol-Propyl Glycol (SYSTANE OP) Apply 1 drop to eye 3 (three) times daily as needed ([place]).    . potassium chloride SA (K-DUR,KLOR-CON) 20 MEQ tablet Take 1 tablet (20 mEq total) by mouth 2 (two)  times daily. 40 tablet 11  . triamcinolone cream (KENALOG) 0.1 % Apply 1 application topically daily as needed (for irritation).      No current facility-administered medications for this visit.    Allergies as of 03/02/2020 - Review Complete 03/02/2020  Allergen Reaction Noted  . Biaxin [clarithromycin] Rash and Other (See Comments) 02/02/2010  . Penicillins Rash and Other (See Comments) 02/02/2010   Past Medical History:  Diagnosis Date  . Anemia    anemia of chronic disease +/- IDA followed by Dr. Earlie Server. received iron infusions in the past  . Asthma   . Borderline glaucoma   . CAD (coronary artery disease)    a. s/p CABG 1996. b. low risk nuc 2011. c. LHC 04/2016 due to drop in EF -> occluded native LAD,  widely patent sequential LIMA-D1-LAD.  Marland Kitchen Chronic kidney disease    "mild stage of kidney  disease"  . Chronic systolic CHF (congestive heart failure) ([place])   . Complication of anesthesia   . DM2 (diabetes mellitus, type 2) ([place])    not on any medicine for this at this time, 05/2016  . GERD (gastroesophageal reflux disease)   . Gout   . History of hiatal hernia    in 82s  . History of pneumonia    March, 2016  . HLD (hyperlipidemia)   . HTN (hypertension)   . Hyperthyroidism 01/21/2016  . Inappropriate sinus tachycardia   . LBBB (left bundle branch block)    a. Seen in 04/2016  . Macular degeneration    left  . Myocardial infarction ([place])    1996  . Neuropathy   . NICM (nonischemic cardiomyopathy) ([place])    a. Dx 04/2016 - EF 25-30%, diffuse HK, elevated LVEDP, mild MR, mod LAE.  Marland Kitchen NSVT (nonsustained ventricular tachycardia) ([place])   . PAT (paroxysmal atrial tachycardia) ([place])   . PONV (postoperative nausea and vomiting)    "after just about every surgery I've had"  . Rheumatoid arthritis ([facility])    RA  . Seasonal allergies    Past Surgical History:  Procedure Laterality Date  . ABDOMINAL HYSTERECTOMY    . APPENDECTOMY    . BACK SURGERY    . CARDIAC CATHETERIZATION  N/A 05/01/2016   Procedure: Right/Left Heart Cath and Coronary/Graft Angiography;  Surgeon: Leonie Man, MD;  Location: [facility];  Service: Cardiovascular;  Laterality: N/A;  . CHOLECYSTECTOMY    . COLONOSCOPY  11/2011   Dr. Magod:ext/int hemorrhoids, diverticulosis sigmoid colon and distal desc colon, mid-desc colon hyperplastic polyps.   . CORONARY ARTERY BYPASS GRAFT  1996   2 vessels  . ESOPHAGOGASTRODUODENOSCOPY  11/2011   Dr. Watt Climes: small hiatal hernia, one non-bleeding superficial gastric ulcer, medium-sized diverticulum in area of papilla  . ESOPHAGOGASTRODUODENOSCOPY N/A 12/29/2015   Dr. Gala Romney: Chronic inactive gastritis, normal esophagus status post dilation, duodenal diverticula  . ESOPHAGOGASTRODUODENOSCOPY (EGD) WITH PROPOFOL N/A 07/24/2019   Dr. Gala Romney: Normal esophagus status post empiric dilation, small hiatal hernia, large D2 diverticulum  . EYE SURGERY Bilateral    cataract removal  . FOOT SURGERY     removal bone spur  . FRACTURE SURGERY Right    arm  . GIVENS CAPSULE STUDY  11/2012   Dr. Watt Climes: minimal gastritis, normal small bowel capsule  . HERNIA REPAIR     hiatal hernia  . MALONEY DILATION N/A 07/24/2019   Procedure: Venia Minks DILATION;  Surgeon: Daneil Dolin, MD;  [facility];  Service: Endoscopy;  Laterality: N/A;  . MAXIMUM ACCESS (MAS)POSTERIOR LUMBAR INTERBODY FUSION (PLIF) 1 LEVEL N/A 08/14/2013   Procedure:  MAXIMUM ACCESS SURGERY(MAS) POSTERIOR LUMBAR INTERBODY FUSION LUMBAR THREE-FOUR ;  Surgeon: Eustace Moore, MD;  Location: [facility];  Service: Neurosurgery;  Laterality: N/A;   MAXIMUM ACCESS SURGERY(MAS) POSTERIOR LUMBAR INTERBODY FUSION LUMBAR THREE-FOUR   . PTCA    . SPINAL CORD STIMULATOR INSERTION N/A 06/16/2016   Procedure: LUMBAR SPINAL CORD STIMULATOR INSERTION;  Surgeon: Clydell Hakim, MD;  Location: [facility];  Service: Neurosurgery;  Laterality: N/A;  LUMBAR SPINAL CORD STIMULATOR INSERTION  . tens  05/2016  .  TONSILLECTOMY     Family History  Problem Relation Age of Onset  . Heart attack Father   . Heart attack Mother   . Bladder Cancer Brother   . Cervical cancer Daughter        ?  . Colon cancer  Neg Hx    Social History   Tobacco Use  . Smoking status: Former Smoker    Packs/day: 0.75    Years: 15.00    Pack years: 11.25    Types: Cigarettes    Quit date: 11/20/1994    Years since quitting: 25.3  . Smokeless tobacco: Never Used  . Tobacco comment: Quit in 1996  Substance Use Topics  . Alcohol use: No    Alcohol/week: 0.0 standard drinks  . Drug use: No    ROS:  General: Negative for anorexia, weight loss, fever, chills, fatigue, weakness. ENT: Negative for hoarseness, difficulty swallowing , nasal congestion. CV: Negative for chest pain, angina, palpitations, dyspnea on exertion, peripheral edema.  Respiratory: Negative for dyspnea at rest, dyspnea on exertion, cough, sputum, wheezing.  GI: See history of present illness. GU:  Negative for dysuria, hematuria, urinary incontinence, urinary frequency, nocturnal urination.  Endo: Negative for unusual weight change.    Physical Examination:   BP (!) 100/58   Pulse 91   Temp (!) 97.1 F (36.2 C) (Oral)   Ht 5' 2.5" (1.588 m)   Wt 142 lb 12.8 oz (64.8 kg)   BMI 25.70 kg/m   General: Well-nourished, well-developed in no acute distress.  Eyes: No icterus. Mouth: masked Lungs: Clear to auscultation bilaterally.  Heart: Regular rate and rhythm, no murmurs rubs or gallops.  Abdomen: Bowel sounds are normal, nontender, nondistended, no hepatosplenomegaly or masses, no abdominal bruits or hernia , no rebound or guarding.   Extremities: No lower extremity edema. No clubbing or deformities. Neuro: Alert and oriented x 4   Skin: Warm and dry, no jaundice.   Psych: Alert and cooperative, normal mood and affect.  Labs:  Lab Results  Component Value Date   CREATININE 1.00 02/18/2020   BUN 17 07/22/2019   NA 135 07/22/2019    K 4.4 07/22/2019   CL 101 07/22/2019   CO2 22 07/22/2019       Imaging Studies: CT ABDOMEN PELVIS W CONTRAST  Result Date: 02/18/2020 CLINICAL DATA:  Diarrhea, abdominal pain, nausea/vomiting EXAM: CT ABDOMEN AND PELVIS WITH CONTRAST TECHNIQUE: Multidetector CT imaging of the abdomen and pelvis was performed using the standard protocol following bolus administration of intravenous contrast. CONTRAST:  154mL OMNIPAQUE IOHEXOL 300 MG/ML  SOLN COMPARISON:  CT abdomen/pelvis dated 03/10/2016 FINDINGS: Lower chest: 6 mm nodule inferiorly in the right middle lobe, new. Mild subpleural reticulation at the lung bases. Hepatobiliary: Liver is within normal limits. Status post cholecystectomy. No intrahepatic or extrahepatic ductal dilatation. Pancreas: Within normal limits. Spleen: Within normal limits. Adrenals/Urinary Tract: Adrenal glands are within normal limits. Kidneys are within normal limits.  No hydronephrosis. Bladder is mildly thick-walled although underdistended. Stomach/Bowel: Stomach is within normal limits. No evidence of bowel obstruction.  Small duodenal diverticulum. Appendix is not discretely visualized, reportedly surgically absent. Extensive left colonic diverticulosis, without evidence of diverticulitis. Vascular/Lymphatic: No evidence of abdominal aortic aneurysm. Atherosclerotic calcifications of the abdominal aorta and branch vessels. Circumaortic left renal vein. Small upper abdominal lymph nodes, unchanged from 2017, likely reactive. Reproductive: Status post hysterectomy. Bilateral ovaries are within normal limits. Other: No abdominopelvic ascites. Musculoskeletal: Status post PLIF at L3-4. Mild to moderate superior endplate compression fracture deformity at T11, chronic. IMPRESSION: No evidence of bowel obstruction. Prior appendectomy. Extensive left colonic diverticulosis, without evidence of diverticulitis. Status post cholecystectomy and hysterectomy. No CT findings to account for  the patient's abdominal pain/diarrhea. 6 mm nodule in the right middle lobe, new. Consider dedicated CT  chest for further evaluation. Electronically Signed   By: Julian Hy M.D.   On: 02/18/2020 23:32

## 2020-03-02 NOTE — Patient Instructions (Addendum)
1. For diarrhea: Trial of Questran powder 4 grams once daily. Do not take within 3 hours of other medications. If you get constipated, you can hold dose for a day and then restart at 1/2 dose (2 grams daily).  2. Colonoscopy as scheduled. See separate instructions. Hold Questran and any other antidiarrhea medications the week before your procedure. 3. Once things settle down, call if you decide you want to pursue appointment with speech therapy for swallowing test.  4. Let me know if you have any questions or concerns!

## 2020-03-07 ENCOUNTER — Encounter: Payer: Self-pay | Admitting: Gastroenterology

## 2020-03-07 ENCOUNTER — Telehealth: Payer: Self-pay | Admitting: Gastroenterology

## 2020-03-07 NOTE — Telephone Encounter (Signed)
Please request labs done in the past six months from PCP.

## 2020-03-07 NOTE — Assessment & Plan Note (Signed)
Possibly due to bile acid diarrhea vs IBS vs microscopic colitis. Last colonoscopy 2013 but no random colon biopsies. She has been screened for celiac disease. Trial of questran 4 grams daily. She has been advised to avoid taking other medications within 3 hours of the [place]. We can adjust dose based on clinical response. Plan for colonoscopy in near future. Consider random colon biopsies to evaluate her chronic diarrhea. Plan for deep sedation given polypharmacy. She is ASA II classification.  I have discussed the risks, alternatives, benefits with regards to but not limited to the risk of reaction to medication, bleeding, infection, perforation and the patient is agreeable to proceed. Written consent to be obtained.

## 2020-03-07 NOTE — Assessment & Plan Note (Signed)
Weight loss noted. May be multifactorial. Patient avoids food at times due to postprandial diarrhea. Chest CT pending. Plan for colonoscopy as outlined. Will request labs from PCP for review.

## 2020-03-07 NOTE — Assessment & Plan Note (Signed)
Persistent dysphagia s/p esophageal dilation. BPE 09/2019 with moderate esophageal dysmotility with prolonged thoracic retention of contrast, laryngeal penetration to level of vocal cords, prominent GERD. She may also have element of oropharyngeal component based on clinical description. I have offered her modified barium swallow study +/- esophageal manometry but at this time she is not interested due to other pending medical issues. She will continue to avoid hard meats, concentrate on soft foods. Drink plenty of liquids when taking her medications.   She will continue pantoprazole 40mg  BID and use TUMS prn for reflux. When she runs out of pantoprazole, consider trial of Dexilant or Nexium.

## 2020-03-10 ENCOUNTER — Encounter (HOSPITAL_COMMUNITY): Payer: Medicare Other

## 2020-03-10 ENCOUNTER — Other Ambulatory Visit (HOSPITAL_COMMUNITY): Payer: Medicare Other

## 2020-03-11 NOTE — Telephone Encounter (Signed)
requested

## 2020-03-17 ENCOUNTER — Other Ambulatory Visit: Payer: Self-pay

## 2020-03-17 ENCOUNTER — Ambulatory Visit (HOSPITAL_COMMUNITY)
Admission: RE | Admit: 2020-03-17 | Discharge: 2020-03-17 | Disposition: A | Discharge status: Home / Self Care | Payer: Medicare Other | Source: Ambulatory Visit | Attending: Cardiovascular Disease | Admitting: Cardiovascular Disease

## 2020-03-17 ENCOUNTER — Encounter (HOSPITAL_BASED_OUTPATIENT_CLINIC_OR_DEPARTMENT_OTHER)
Admission: RE | Admit: 2020-03-17 | Discharge: 2020-03-17 | Disposition: A | Discharge status: Home / Self Care | Payer: Medicare Other | Source: Ambulatory Visit | Attending: Cardiovascular Disease | Admitting: Cardiovascular Disease

## 2020-03-17 ENCOUNTER — Encounter (HOSPITAL_COMMUNITY)
Admission: RE | Admit: 2020-03-17 | Discharge: 2020-03-17 | Disposition: A | Discharge status: Home / Self Care | Payer: Medicare Other | Source: Ambulatory Visit | Attending: Cardiovascular Disease | Admitting: Cardiovascular Disease

## 2020-03-17 DIAGNOSIS — Z951 Presence of aortocoronary bypass graft: Secondary | ICD-10-CM | POA: Diagnosis not present

## 2020-03-17 DIAGNOSIS — R0602 Shortness of breath: Secondary | ICD-10-CM | POA: Insufficient documentation

## 2020-03-17 DIAGNOSIS — R079 Chest pain, unspecified: Secondary | ICD-10-CM | POA: Insufficient documentation

## 2020-03-17 DIAGNOSIS — R55 Syncope and collapse: Secondary | ICD-10-CM | POA: Diagnosis present

## 2020-03-17 LAB — NM MYOCAR MULTI W/SPECT W/WALL MOTION / EF
LV dias vol: 96 mL (ref 46–106)
LV sys vol: 63 mL
Peak HR: 113 {beats}/min
RATE: 0.39
Rest HR: 85 {beats}/min
SDS: 4
SRS: 5
SSS: 9
TID: 0.93

## 2020-03-17 MED ORDER — TECHNETIUM TC 99M TETROFOSMIN IV KIT
10.0000 | PACK | Freq: Once | INTRAVENOUS | Status: AC | PRN
  Administered 2020-03-17: 10 via INTRAVENOUS

## 2020-03-17 MED ORDER — SODIUM CHLORIDE FLUSH 0.9 % IV SOLN
INTRAVENOUS | Status: AC
  Administered 2020-03-17: 10 mL via INTRAVENOUS
  Filled 2020-03-17: qty 10

## 2020-03-17 MED ORDER — REGADENOSON 0.4 MG/5ML IV SOLN
INTRAVENOUS | Status: AC
  Administered 2020-03-17: 0.4 mg via INTRAVENOUS
  Filled 2020-03-17: qty 5

## 2020-03-17 MED ORDER — TECHNETIUM TC 99M TETROFOSMIN IV KIT
30.0000 | PACK | Freq: Once | INTRAVENOUS | Status: AC | PRN
  Administered 2020-03-17: 30 via INTRAVENOUS

## 2020-03-17 NOTE — Progress Notes (Signed)
*  PRELIMINARY RESULTS* Echocardiogram 2D Echocardiogram has been performed.  Katherine Larson 03/17/2020, 2:21 PM

## 2020-03-18 ENCOUNTER — Ambulatory Visit (HOSPITAL_COMMUNITY)
Admission: RE | Admit: 2020-03-18 | Discharge: 2020-03-18 | Disposition: A | Discharge status: Home / Self Care | Payer: Medicare Other | Source: Ambulatory Visit | Attending: Family Medicine | Admitting: Family Medicine

## 2020-03-18 DIAGNOSIS — R911 Solitary pulmonary nodule: Secondary | ICD-10-CM

## 2020-03-18 MED ORDER — IOHEXOL 300 MG/ML  SOLN
75.0000 mL | Freq: Once | INTRAMUSCULAR | Status: AC | PRN
  Administered 2020-03-18: 75 mL via INTRAVENOUS

## 2020-03-22 NOTE — Progress Notes (Signed)
Cardiology Office Note    Date:  03/23/2020   ID:  Katherine Larson, Katherine Larson Jan 13, 1946, MRN [id number]  PCP:  [name]  Cardiologist: No primary care provider on file. EPS: None  No chief complaint on file.   History of Present Illness:  Katherine Larson is a 74 y.o. female with history of CAD status post CABG in 1996 with a LIMA to the LAD and diagonal, graft patent on cath in 2017, nonischemic cardiomyopathy EF 35% 2017 improved to 40 to 45% 2018, NSVT, PAT, LBBB, nerve stimulator for chronic back pain, GERD   Patient saw Dr. [name] Cancel 02/20/2020 with episodes of dyspnea and chest tightness.  NST 03/17/2020 prior anterior MI with moderate peri-infarct ischemia, EF 35% high risk study based on LV dysfunction and moderate peri-infarct ischemia.  2D echo 03/17/2020 moderate LV dysfunction EF 35-40% down from last echo.  Dr. [name] Cancel recommends cardiac catheterization to further evaluate.  Patient comes in accompanied by her husband. No further chest pain. Dyspnea on exertion has worsened. Gets out of breath doing housework. Walks 1/2 way across  grocery store and has to stop.  Past Medical History:  Diagnosis Date  . Anemia    anemia of chronic disease +/- IDA followed by Dr. Earlie Server. received iron infusions in the past  . Asthma   . Borderline glaucoma   . CAD (coronary artery disease)    a. s/p CABG 1996. b. low risk nuc 2011. c. LHC 04/2016 due to drop in EF -> occluded native LAD,  widely patent sequential LIMA-D1-LAD.  Marland Kitchen Chronic kidney disease    "mild stage of kidney disease"  . Chronic systolic CHF (congestive heart failure) ([place])   . Complication of anesthesia   . DM2 (diabetes mellitus, type 2) ([place])    not on any medicine for this at this time, 05/2016  . GERD (gastroesophageal reflux disease)   . Gout   . History of hiatal hernia    in 25s  . History of pneumonia    March, 2016  . HLD (hyperlipidemia)   . HTN (hypertension)   . Hyperthyroidism  01/21/2016  . Inappropriate sinus tachycardia   . LBBB (left bundle branch block)    a. Seen in 04/2016  . Macular degeneration    left  . Myocardial infarction ([place])    1996  . Neuropathy   . NICM (nonischemic cardiomyopathy) ([place])    a. Dx 04/2016 - EF 25-30%, diffuse HK, elevated LVEDP, mild MR, mod LAE.  Marland Kitchen NSVT (nonsustained ventricular tachycardia) ([place])   . PAT (paroxysmal atrial tachycardia) ([place])   . PONV (postoperative nausea and vomiting)    "after just about every surgery I've had"  . Rheumatoid arthritis (HCC)    RA  . Seasonal allergies     Past Surgical History:  Procedure Laterality Date  . ABDOMINAL HYSTERECTOMY    . APPENDECTOMY    . BACK SURGERY    . CARDIAC CATHETERIZATION N/A 05/01/2016   Procedure: Right/Left Heart Cath and Coronary/Graft Angiography;  Surgeon: Leonie Man, MD;  Location: [facility];  Service: Cardiovascular;  Laterality: N/A;  . CHOLECYSTECTOMY    . COLONOSCOPY  11/2011   Dr. Magod:ext/int hemorrhoids, diverticulosis sigmoid colon and distal desc colon, mid-desc colon hyperplastic polyps.   . CORONARY ARTERY BYPASS GRAFT  1996   2 vessels  . ESOPHAGOGASTRODUODENOSCOPY  11/2011   Dr. Watt Climes: small hiatal hernia, one non-bleeding superficial gastric ulcer, medium-sized diverticulum in area of papilla  .  ESOPHAGOGASTRODUODENOSCOPY N/A 12/29/2015   Dr. Gala Romney: Chronic inactive gastritis, normal esophagus status post dilation, duodenal diverticula  . ESOPHAGOGASTRODUODENOSCOPY (EGD) WITH PROPOFOL N/A 07/24/2019   Dr. Gala Romney: Normal esophagus status post empiric dilation, small hiatal hernia, large D2 diverticulum  . EYE SURGERY Bilateral    cataract removal  . FOOT SURGERY     removal bone spur  . FRACTURE SURGERY Right    arm  . GIVENS CAPSULE STUDY  11/2012   Dr. Watt Climes: minimal gastritis, normal small bowel capsule  . HERNIA REPAIR     hiatal hernia  . MALONEY DILATION N/A 07/24/2019   Procedure: Venia Minks DILATION;  Surgeon: Daneil Dolin, MD;  [facility];  Service: Endoscopy;  Laterality: N/A;  . MAXIMUM ACCESS (MAS)POSTERIOR LUMBAR INTERBODY FUSION (PLIF) 1 LEVEL N/A 08/14/2013   Procedure:  MAXIMUM ACCESS SURGERY(MAS) POSTERIOR LUMBAR INTERBODY FUSION LUMBAR THREE-FOUR ;  Surgeon: Eustace Moore, MD;  Location: [facility] [facility];  Service: Neurosurgery;  Laterality: N/A;   MAXIMUM ACCESS SURGERY(MAS) POSTERIOR LUMBAR INTERBODY FUSION LUMBAR THREE-FOUR   . PTCA    . SPINAL CORD STIMULATOR INSERTION N/A 06/16/2016   Procedure: LUMBAR SPINAL CORD STIMULATOR INSERTION;  Surgeon: Clydell Hakim, MD;  Location: [facility];  Service: Neurosurgery;  Laterality: N/A;  LUMBAR SPINAL CORD STIMULATOR INSERTION  . tens  05/2016  . TONSILLECTOMY      Current Medications: Current Meds  Medication Sig  . albuterol (PROVENTIL HFA;VENTOLIN HFA) 108 (90 Base) MCG/ACT inhaler Take 2 puffs 3 times a day and every 4 hours as needed. (Patient taking differently: Inhale into the lungs. Take 2 puffs 3 times a day and every 4 hours as needed.)  . allopurinol (ZYLOPRIM) 300 MG tablet Take 300 mg by mouth daily.  Marland Kitchen aspirin EC 81 MG EC tablet Take 1 tablet (81 mg total) by mouth daily. (Patient taking differently: Take 81 mg by mouth every morning. )  . atorvastatin (LIPITOR) 80 MG tablet Take 80 mg by mouth daily.  . calcium carbonate (TUMS - DOSED IN MG ELEMENTAL CALCIUM) 500 MG chewable tablet Chew 1 tablet by mouth as needed for indigestion or heartburn.  . carvedilol (COREG) 12.5 MG tablet TAKE 1 TABLET BY MOUTH TWO  TIMES DAILY (Patient taking differently: Take 12.5 mg by mouth 2 (two) times daily with a meal. )  . cholecalciferol (VITAMIN D3) 25 MCG (1000 UT) tablet Take 1,000 Units by mouth daily.  . cholestyramine (QUESTRAN) 4 GM/DOSE powder Take 1 packet (4 g total) by mouth daily. Do not take within 3 hours of other medications  . fexofenadine (ALLEGRA) 180 MG tablet Take 180 mg by mouth daily.    . fluticasone (FLONASE) 50 MCG/ACT  nasal spray Place 2 sprays into both nostrils daily as needed for allergies.   . furosemide (LASIX) 40 MG tablet Take 40 mg by mouth 2 (two) times daily.   Marland Kitchen gabapentin (NEURONTIN) 300 MG capsule Take 300 mg by mouth 3 (three) times daily.   Marland Kitchen HYDROcodone-acetaminophen (NORCO) 7.5-325 MG tablet Take 1 tablet by mouth every 6 (six) hours as needed for moderate pain.  . Lancets (ONETOUCH DELICA PLUS [id number]) [place]ake   . lisinopril (PRINIVIL,ZESTRIL) 5 MG tablet TAKE 1 TABLET BY MOUTH  DAILY  . loperamide (IMODIUM A-D) 2 MG tablet Take 2 mg by mouth 4 (four) times daily as needed for diarrhea or loose stools.  . magnesium oxide (MAGNESIUM-OXIDE) 400 (241.3 Mg) MG tablet Take 1 tablet (400 mg total) by mouth daily.  Marland Kitchen  metFORMIN (GLUCOPHAGE) 500 MG tablet Take 1,000 mg by mouth. At night  . montelukast (SINGULAIR) 10 MG tablet Take 10 mg by mouth at bedtime.  . Multiple Vitamins-Minerals (MACULAR VITAMIN BENEFIT PO) Take 2 tablets by mouth daily.  . nitroGLYCERIN (NITROSTAT) 0.4 MG SL tablet Place 1 tablet (0.4 mg total) under the tongue every 5 (five) minutes as needed for chest pain. Reported on 03/24/2016  . pantoprazole (PROTONIX) 40 MG tablet Take 1 tablet (40 mg total) by mouth 2 (two) times daily before a meal.  . Polyethyl Glycol-Propyl Glycol (SYSTANE OP) Apply 1 drop to eye 3 (three) times daily as needed (Dry Eyes).  . potassium chloride SA (K-DUR,KLOR-CON) 20 MEQ tablet Take 1 tablet (20 mEq total) by mouth 2 (two) times daily.  Marland Kitchen triamcinolone cream (KENALOG) 0.1 % Apply 1 application topically daily as needed (for irritation).    Current Facility-Administered Medications for the 03/23/20 encounter (Office Visit) with Imogene Burn, PA-C  Medication  . sodium chloride flush (NS) 0.9 % injection 3 mL     Allergies:   Biaxin [clarithromycin] and Penicillins   Social History   Socioeconomic History  . Marital status: Married    Spouse name: Not on file  . Number of children: 3  .  Years of education: Not on file  . Highest education level: Not on file  Occupational History  . Not on file  Tobacco Use  . Smoking status: Former Smoker    Packs/day: 0.75    Years: 15.00    Pack years: 11.25    Types: Cigarettes    Quit date: 11/20/1994    Years since quitting: 25.3  . Smokeless tobacco: Never Used  . Tobacco comment: Quit in 1996  Substance and Sexual Activity  . Alcohol use: No    Alcohol/week: 0.0 standard drinks  . Drug use: No  . Sexual activity: Yes  Other Topics Concern  . Not on file  Social History Narrative   2 living children, one deceased age 33   Social Determinants of Health   Financial Resource Strain:   . Difficulty of Paying Living Expenses:   Food Insecurity:   . Worried About Charity fundraiser in the Last Year:   . Arboriculturist in the Last Year:   Transportation Needs:   . Film/video editor (Medical):   Marland Kitchen Lack of Transportation (Non-Medical):   Physical Activity:   . Days of Exercise per Week:   . Minutes of Exercise per Session:   Stress:   . Feeling of Stress :   Social Connections:   . Frequency of Communication with Friends and Family:   . Frequency of Social Gatherings with Friends and Family:   . Attends Religious Services:   . Active Member of Clubs or Organizations:   . Attends Archivist Meetings:   Marland Kitchen Marital Status:      Family History:  The patient's   family history includes Bladder Cancer in her brother; Cervical cancer in her daughter; Heart attack in her father and mother.   ROS:   Please see the history of present illness.    Review of Systems  Constitution: Negative.  HENT: Negative.   Eyes: Negative.   Cardiovascular: Positive for dyspnea on exertion.  Respiratory: Positive for shortness of breath.   Hematologic/Lymphatic: Negative.   Musculoskeletal: Negative.  Negative for joint pain.  Gastrointestinal: Negative.   Genitourinary: Negative.   Neurological: Negative.    All other  systems  reviewed and are negative.   PHYSICAL EXAM:   VS:  BP 118/60   Pulse 78   Temp (!) 96.4 F (35.8 C)   Ht 5' 2.5" (1.588 m)   Wt 141 lb 9.6 oz (64.2 kg)   SpO2 94%   BMI 25.49 kg/m   Physical Exam  GEN: Well nourished, well developed, in no acute distress  HEENT: normal  Neck: no JVD, carotid bruits, or masses Cardiac:RRR; no murmurs, rubs, or gallops  Respiratory:  clear to auscultation bilaterally, normal work of breathing GI: soft, nontender, nondistended, + BS Ext: without cyanosis, clubbing, or edema, Good distal pulses bilaterally MS: no deformity or atrophy  Skin: warm and dry, no rash Neuro:  Alert and Oriented x 3 Psych: euthymic mood, full affect  Wt Readings from Last 3 Encounters:  03/23/20 141 lb 9.6 oz (64.2 kg)  03/02/20 142 lb 12.8 oz (64.8 kg)  02/20/20 146 lb (66.2 kg)      Studies/Labs Reviewed:   EKG:  EKG is not ordered today.  The ekg reviewed from stress test 03/17/20-NSR with LBBB unchanged. Recent Labs: 07/22/2019: BUN 17; Potassium 4.4; Sodium 135 02/18/2020: Creatinine, Ser 1.00   Lipid Panel    Component Value Date/Time   CHOL 112 12/19/2007 0910   TRIG 177 (H) 12/19/2007 0910   HDL 30.2 (L) 12/19/2007 0910   CHOLHDL 3.7 CALC 12/19/2007 0910   VLDL 35 12/19/2007 0910   LDLCALC 46 12/19/2007 0910    Additional studies/ records that were reviewed today include:  2D echo 03/17/2020  IMPRESSIONS     1. Left ventricular ejection fraction, by estimation, is 35 to 40%. The  left ventricle has normal function. The left ventricle demonstrates global  hypokinesis. Left ventricular diastolic parameters are indeterminate.   2. Right ventricular systolic function is mildly reduced. The right  ventricular size is normal. There is normal pulmonary artery systolic  pressure.   3. The mitral valve is normal in structure. No evidence of mitral valve  regurgitation. No evidence of mitral stenosis.   4. The aortic valve is tricuspid. Aortic  valve regurgitation is mild. No  aortic stenosis is present.   5. The inferior vena cava is normal in size with greater than 50%  respiratory variability, suggesting right atrial pressure of 3 mmHg.   FINDINGS   Left Ventricle: Left ventricular ejection fraction, by estimation, is 35  to 40%. The left ventricle has normal function. The left ventricle  demonstrates global hypokinesis. The left ventricular internal cavity size  was normal in size. There is no left  ventricular hypertrophy. Left ventricular diastolic parameters are  indeterminate.   Right Ventricle: The right ventricular size is normal. No increase in  right ventricular wall thickness. Right ventricular systolic function is  mildly reduced. There is normal pulmonary artery systolic pressure. The  tricuspid regurgitant velocity is 2.20  m/s, and with an assumed right atrial pressure of 3 mmHg, the estimated  right ventricular systolic pressure is [id number] mmHg.   Left Atrium: Left atrial size was normal in size.   Right Atrium: Right atrial size was normal in size.   Pericardium: There is no evidence of pericardial effusion.   Mitral Valve: The mitral valve is normal in structure. No evidence of  mitral valve regurgitation. No evidence of mitral valve stenosis.   Tricuspid Valve: The tricuspid valve is normal in structure. Tricuspid  valve regurgitation is mild . No evidence of tricuspid stenosis.   Aortic Valve: The aortic valve is  tricuspid. Aortic valve regurgitation is  mild. No aortic stenosis is present. Aortic valve mean gradient measures  3.4 mmHg. Aortic valve peak gradient measures 7.4 mmHg. Aortic valve area,  by VTI measures 2.36 cm.   Pulmonic Valve: The pulmonic valve was not well visualized. Pulmonic valve  regurgitation is not visualized. No evidence of pulmonic stenosis.   Aorta: The aortic root is normal in size and structure.   Pulmonary Artery: No evidence of pulmonary HTN, PASP is 22 mmHg.    Venous: The inferior vena cava is normal in size with greater than 50%  respiratory variability, suggesting right atrial pressure of 3 mmHg.   IAS/Shunts: No atrial level shunt detected by color flow Doppler.      NST 4/28/2021There was no ST segment deviation noted during stress.  Findings consistent with prior anterior myocardial infarction with moderate peri-infarct ischemia.  This is a high risk study. Risk based on combined LV dysfunction and moderate peri-infarct ischemia.  The left ventricular ejection fraction is moderately decreased (35%).    Cardiac catheterization 2017 1. Mid LAD lesion, 100% stenosed. The lesion was previously treated with a bare metal stent greater than two years ago. 2. Sequential LIMA-D1-LAD was injected is large, and is anatomically normal. Anastomosis is distal to occluded LAD stent 3. Essentially normal right heart cath pressures with normal LVEDP     Based on the evidence of widely patent LIMA graft and native arteries nongrafted, the sudden drop in EF is likely nonischemic in nature. She appears to be relatively euvolemic and adequately diuresed.   Plan:  Sheath removal in Cath Lab with manual pressure for hemostasis.  Return to nursing unit for continued management by primary service.  Expect that she should be ready for discharge soon, pending reevaluation by primary team.           ASSESSMENT:    1. Chest pain, unspecified type   2. Abnormal stress test   3. Coronary artery disease involving coronary bypass graft of native heart with angina pectoris ([place])   4. NICM (nonischemic cardiomyopathy) ([place])   5. Essential hypertension   6. Type 2 diabetes mellitus without complication, without long-term current use of insulin (HCC)   7. LBBB (left bundle branch block)      PLAN:  In order of problems listed above:  Chest pain and dyspnea on exertion with Abnormal NST with prior anterior MI with moderate peri-infarct ischemia EF  35% high risk study based on combined LV dysfunction and moderate peri-infarct ischemia.  Right and left Cardiac catheterization recommended by Dr. [name] Cancel.  Last cath 2017 LIMA to the diagonal and LAD was patent. Reviewed in detail with patient. I have reviewed the risks, indications, and alternatives to angioplasty and stenting with the patient. Risks include but are not limited to bleeding, infection, vascular injury, stroke, myocardial infection, arrhythmia, kidney injury, radiation-related injury in the case of prolonged fluoroscopy use, emergency cardiac surgery, and death. The patient understands the risks of serious complication is low (123456) and patient agrees to proceed.    CAD S/P CABG 1996, cath as above 2017   History of nonischemic cardiomyopathy in 2017 35% improved to 45-50% echo 2018, now 35-40%. Plan to titrate entresto up after cath.   Essential hypertension BP controlled.  Diabetes mellitus managed by PCP  Left bundle branch block-chronic    Medication Adjustments/Labs and Tests Ordered: Current medicines are reviewed at length with the patient today.  Concerns regarding medicines are outlined above.  Medication changes, Labs  and Tests ordered today are listed in the Patient Instructions below. Patient Instructions  Medication Instructions:  Your physician recommends that you continue on your current medications as directed. Please refer to the Current Medication list given to you today.  *If you need a refill on your cardiac medications before your next appointment, please call your pharmacy*   Lab Work: Your physician recommends that you return for lab work in: Today   If you have labs (blood work) drawn today and your tests are completely normal, you will receive your results only by: Marland Kitchen MyChart Message (if you have MyChart) OR . A paper copy in the mail If you have any lab test that is abnormal or we need to change your treatment, we will call you to review the  results.   Testing/Procedures: Your physician has requested that you have a cardiac catheterization. Cardiac catheterization is used to diagnose and/or treat various heart conditions. Doctors may recommend this procedure for a number of different reasons. The most common reason is to evaluate chest pain. Chest pain can be a symptom of coronary artery disease (CAD), and cardiac catheterization can show whether plaque is narrowing or blocking your heart's arteries. This procedure is also used to evaluate the valves, as well as measure the blood flow and oxygen levels in different parts of your heart. For further information please visit [email or web address]. Please follow instruction sheet, as given.     Follow-Up: At [facility], you and your health needs are our priority.  As part of our continuing mission to provide you with exceptional heart care, we have created designated Provider Care Teams.  These Care Teams include your primary Cardiologist (physician) and Advanced Practice Providers (APPs -  Physician Assistants and Nurse Practitioners) who all work together to provide you with the care you need, when you need it.  We recommend signing up for the patient portal called "MyChart".  Sign up information is provided on this After Visit Summary.  MyChart is used to connect with patients for Virtual Visits (Telemedicine).  Patients are able to view lab/test results, encounter notes, upcoming appointments, etc.  Non-urgent messages can be sent to your provider as well.   To learn more about what you can do with MyChart, go to [email or web address].    Your next appointment:   2-3 week(s)  The format for your next appointment:   In Person  Provider:   Ermalinda Barrios, PA-C   Other Instructions Thank you for choosing [facility]!     [address] [place] [address] Dept:  [phone] Loc: [phone]  Karilynn Sherbondy  03/23/2020  You are scheduled for a Cardiac Catheterization on Friday, May 7 with Dr. Glenetta Hew.  1. Please arrive at the [facility] (Main Entrance A) at [facility]: [address] [place], [place] [address] at 10:00 AM (This time is two hours before your procedure to ensure your preparation). Free valet parking service is available.   Special note: Every effort is made to have your procedure done on time. Please understand that emergencies sometimes delay scheduled procedures.  2. Diet: Do not eat solid foods after midnight.  The patient may have clear liquids until 5am upon the day of the procedure.  3. Labs: You will need to have blood drawn on Tuesday, May 4 at [place]. [address], [place]  Open: 7am - 6pm, Sat 8am - 12 noon  Phone: [phone]. You do not need to be fasting.  4. Medication instructions in preparation for your procedure:   Contrast Allergy: No  Stop taking, Lasix (Furosemide)  Friday, May 7,  Do not take Diabetes Med Glucophage (Metformin) on the day of the procedure and HOLD 48 HOURS AFTER THE PROCEDURE.  On the morning of your procedure, take your Aspirin and any morning medicines NOT listed above.  You may use sips of water.  5. Plan for one night stay--bring personal belongings. 6. Bring a current list of your medications and current insurance cards. 7. You MUST have a responsible person to drive you home. 8. Someone MUST be with you the first 24 hours after you arrive home or your discharge will be delayed. 9. Please wear clothes that are easy to get on and off and wear slip-on shoes.  Thank you for allowing [place] to care for you!   -- [facility] Invasive Cardiovascular services       Signed, Ermalinda Barrios, PA-C  03/23/2020 12:20 PM    [address], [place], [place]  [address] Phone: [phone]; Fax: [phone]

## 2020-03-22 NOTE — H&P (View-Only) (Signed)
Cardiology Office Note    Date:  03/23/2020   ID:  Katherine Larson, Katherine Larson 13-May-1946, MRN [id number]  PCP:  [name]  Cardiologist: No primary care provider on file. EPS: None  No chief complaint on file.   History of Present Illness:  Katherine Larson is a 74 y.o. female with history of CAD status post CABG in 1996 with a LIMA to the LAD and diagonal, graft patent on cath in 2017, nonischemic cardiomyopathy EF 35% 2017 improved to 40 to 45% 2018, NSVT, PAT, LBBB, nerve stimulator for chronic back pain, GERD   Patient saw Dr. [name] Cancel 02/20/2020 with episodes of dyspnea and chest tightness.  NST 03/17/2020 prior anterior MI with moderate peri-infarct ischemia, EF 35% high risk study based on LV dysfunction and moderate peri-infarct ischemia.  2D echo 03/17/2020 moderate LV dysfunction EF 35-40% down from last echo.  Dr. [name] Cancel recommends cardiac catheterization to further evaluate.  Patient comes in accompanied by her husband. No further chest pain. Dyspnea on exertion has worsened. Gets out of breath doing housework. Walks 1/2 way across  grocery store and has to stop.  Past Medical History:  Diagnosis Date  . Anemia    anemia of chronic disease +/- IDA followed by Dr. Earlie Server. received iron infusions in the past  . Asthma   . Borderline glaucoma   . CAD (coronary artery disease)    a. s/p CABG 1996. b. low risk nuc 2011. c. LHC 04/2016 due to drop in EF -> occluded native LAD,  widely patent sequential LIMA-D1-LAD.  Marland Kitchen Chronic kidney disease    "mild stage of kidney disease"  . Chronic systolic CHF (congestive heart failure) ([place])   . Complication of anesthesia   . DM2 (diabetes mellitus, type 2) ([place])    not on any medicine for this at this time, 05/2016  . GERD (gastroesophageal reflux disease)   . Gout   . History of hiatal hernia    in 102s  . History of pneumonia    March, 2016  . HLD (hyperlipidemia)   . HTN (hypertension)   . Hyperthyroidism  01/21/2016  . Inappropriate sinus tachycardia   . LBBB (left bundle branch block)    a. Seen in 04/2016  . Macular degeneration    left  . Myocardial infarction ([place])    1996  . Neuropathy   . NICM (nonischemic cardiomyopathy) ([place])    a. Dx 04/2016 - EF 25-30%, diffuse HK, elevated LVEDP, mild MR, mod LAE.  Marland Kitchen NSVT (nonsustained ventricular tachycardia) ([place])   . PAT (paroxysmal atrial tachycardia) ([place])   . PONV (postoperative nausea and vomiting)    "after just about every surgery I've had"  . Rheumatoid arthritis (HCC)    RA  . Seasonal allergies     Past Surgical History:  Procedure Laterality Date  . ABDOMINAL HYSTERECTOMY    . APPENDECTOMY    . BACK SURGERY    . CARDIAC CATHETERIZATION N/A 05/01/2016   Procedure: Right/Left Heart Cath and Coronary/Graft Angiography;  Surgeon: Leonie Man, MD;  Location: [place] [facility];  Service: Cardiovascular;  Laterality: N/A;  . CHOLECYSTECTOMY    . COLONOSCOPY  11/2011   Dr. Magod:ext/int hemorrhoids, diverticulosis sigmoid colon and distal desc colon, mid-desc colon hyperplastic polyps.   . CORONARY ARTERY BYPASS GRAFT  1996   2 vessels  . ESOPHAGOGASTRODUODENOSCOPY  11/2011   Dr. Watt Climes: small hiatal hernia, one non-bleeding superficial gastric ulcer, medium-sized diverticulum in area of papilla  .  ESOPHAGOGASTRODUODENOSCOPY N/A 12/29/2015   Dr. Gala Romney: Chronic inactive gastritis, normal esophagus status post dilation, duodenal diverticula  . ESOPHAGOGASTRODUODENOSCOPY (EGD) WITH PROPOFOL N/A 07/24/2019   Dr. Gala Romney: Normal esophagus status post empiric dilation, small hiatal hernia, large D2 diverticulum  . EYE SURGERY Bilateral    cataract removal  . FOOT SURGERY     removal bone spur  . FRACTURE SURGERY Right    arm  . GIVENS CAPSULE STUDY  11/2012   Dr. Watt Climes: minimal gastritis, normal small bowel capsule  . HERNIA REPAIR     hiatal hernia  . MALONEY DILATION N/A 07/24/2019   Procedure: Venia Minks DILATION;  Surgeon: Daneil Dolin, MD;  [facility];  Service: Endoscopy;  Laterality: N/A;  . MAXIMUM ACCESS (MAS)POSTERIOR LUMBAR INTERBODY FUSION (PLIF) 1 LEVEL N/A 08/14/2013   Procedure:  MAXIMUM ACCESS SURGERY(MAS) POSTERIOR LUMBAR INTERBODY FUSION LUMBAR THREE-FOUR ;  Surgeon: Eustace Moore, MD;  Location: [place] [facility];  Service: Neurosurgery;  Laterality: N/A;   MAXIMUM ACCESS SURGERY(MAS) POSTERIOR LUMBAR INTERBODY FUSION LUMBAR THREE-FOUR   . PTCA    . SPINAL CORD STIMULATOR INSERTION N/A 06/16/2016   Procedure: LUMBAR SPINAL CORD STIMULATOR INSERTION;  Surgeon: Clydell Hakim, MD;  Location: [place] [facility];  Service: Neurosurgery;  Laterality: N/A;  LUMBAR SPINAL CORD STIMULATOR INSERTION  . tens  05/2016  . TONSILLECTOMY      Current Medications: Current Meds  Medication Sig  . albuterol (PROVENTIL HFA;VENTOLIN HFA) 108 (90 Base) MCG/ACT inhaler Take 2 puffs 3 times a day and every 4 hours as needed. (Patient taking differently: Inhale into the lungs. Take 2 puffs 3 times a day and every 4 hours as needed.)  . allopurinol (ZYLOPRIM) 300 MG tablet Take 300 mg by mouth daily.  Marland Kitchen aspirin EC 81 MG EC tablet Take 1 tablet (81 mg total) by mouth daily. (Patient taking differently: Take 81 mg by mouth every morning. )  . atorvastatin (LIPITOR) 80 MG tablet Take 80 mg by mouth daily.  . calcium carbonate (TUMS - DOSED IN MG ELEMENTAL CALCIUM) 500 MG chewable tablet Chew 1 tablet by mouth as needed for indigestion or heartburn.  . carvedilol (COREG) 12.5 MG tablet TAKE 1 TABLET BY MOUTH TWO  TIMES DAILY (Patient taking differently: Take 12.5 mg by mouth 2 (two) times daily with a meal. )  . cholecalciferol (VITAMIN D3) 25 MCG (1000 UT) tablet Take 1,000 Units by mouth daily.  . cholestyramine (QUESTRAN) 4 GM/DOSE powder Take 1 packet (4 g total) by mouth daily. Do not take within 3 hours of other medications  . fexofenadine (ALLEGRA) 180 MG tablet Take 180 mg by mouth daily.    . fluticasone (FLONASE) 50 MCG/ACT  nasal spray Place 2 sprays into both nostrils daily as needed for allergies.   . furosemide (LASIX) 40 MG tablet Take 40 mg by mouth 2 (two) times daily.   Marland Kitchen gabapentin (NEURONTIN) 300 MG capsule Take 300 mg by mouth 3 (three) times daily.   Marland Kitchen HYDROcodone-acetaminophen (NORCO) 7.5-325 MG tablet Take 1 tablet by mouth every 6 (six) hours as needed for moderate pain.  . Lancets (ONETOUCH DELICA PLUS [id number]) [place]   . lisinopril (PRINIVIL,ZESTRIL) 5 MG tablet TAKE 1 TABLET BY MOUTH  DAILY  . loperamide (IMODIUM A-D) 2 MG tablet Take 2 mg by mouth 4 (four) times daily as needed for diarrhea or loose stools.  . magnesium oxide (MAGNESIUM-OXIDE) 400 (241.3 Mg) MG tablet Take 1 tablet (400 mg total) by mouth daily.  Marland Kitchen  metFORMIN (GLUCOPHAGE) 500 MG tablet Take 1,000 mg by mouth. At night  . montelukast (SINGULAIR) 10 MG tablet Take 10 mg by mouth at bedtime.  . Multiple Vitamins-Minerals (MACULAR VITAMIN BENEFIT PO) Take 2 tablets by mouth daily.  . nitroGLYCERIN (NITROSTAT) 0.4 MG SL tablet Place 1 tablet (0.4 mg total) under the tongue every 5 (five) minutes as needed for chest pain. Reported on 03/24/2016  . pantoprazole (PROTONIX) 40 MG tablet Take 1 tablet (40 mg total) by mouth 2 (two) times daily before a meal.  . Polyethyl Glycol-Propyl Glycol (SYSTANE OP) Apply 1 drop to eye 3 (three) times daily as needed (Dry Eyes).  . potassium chloride SA (K-DUR,KLOR-CON) 20 MEQ tablet Take 1 tablet (20 mEq total) by mouth 2 (two) times daily.  Marland Kitchen triamcinolone cream (KENALOG) 0.1 % Apply 1 application topically daily as needed (for irritation).    Current Facility-Administered Medications for the 03/23/20 encounter (Office Visit) with Imogene Burn, PA-C  Medication  . sodium chloride flush (NS) 0.9 % injection 3 mL     Allergies:   Biaxin [clarithromycin] and Penicillins   Social History   Socioeconomic History  . Marital status: Married    Spouse name: Not on file  . Number of children: 3  .  Years of education: Not on file  . Highest education level: Not on file  Occupational History  . Not on file  Tobacco Use  . Smoking status: Former Smoker    Packs/day: 0.75    Years: 15.00    Pack years: 11.25    Types: Cigarettes    Quit date: 11/20/1994    Years since quitting: 25.3  . Smokeless tobacco: Never Used  . Tobacco comment: Quit in 1996  Substance and Sexual Activity  . Alcohol use: No    Alcohol/week: 0.0 standard drinks  . Drug use: No  . Sexual activity: Yes  Other Topics Concern  . Not on file  Social History Narrative   2 living children, one deceased age 26   Social Determinants of Health   Financial Resource Strain:   . Difficulty of Paying Living Expenses:   Food Insecurity:   . Worried About Charity fundraiser in the Last Year:   . Arboriculturist in the Last Year:   Transportation Needs:   . Film/video editor (Medical):   Marland Kitchen Lack of Transportation (Non-Medical):   Physical Activity:   . Days of Exercise per Week:   . Minutes of Exercise per Session:   Stress:   . Feeling of Stress :   Social Connections:   . Frequency of Communication with Friends and Family:   . Frequency of Social Gatherings with Friends and Family:   . Attends Religious Services:   . Active Member of Clubs or Organizations:   . Attends Archivist Meetings:   Marland Kitchen Marital Status:      Family History:  The patient's   family history includes Bladder Cancer in her brother; Cervical cancer in her daughter; Heart attack in her father and mother.   ROS:   Please see the history of present illness.    Review of Systems  Constitution: Negative.  HENT: Negative.   Eyes: Negative.   Cardiovascular: Positive for dyspnea on exertion.  Respiratory: Positive for shortness of breath.   Hematologic/Lymphatic: Negative.   Musculoskeletal: Negative.  Negative for joint pain.  Gastrointestinal: Negative.   Genitourinary: Negative.   Neurological: Negative.    All other  systems  reviewed and are negative.   PHYSICAL EXAM:   VS:  BP 118/60   Pulse 78   Temp (!) 96.4 F (35.8 C)   Ht 5' 2.5" (1.588 m)   Wt 141 lb 9.6 oz (64.2 kg)   SpO2 94%   BMI 25.49 kg/m   Physical Exam  GEN: Well nourished, well developed, in no acute distress  HEENT: normal  Neck: no JVD, carotid bruits, or masses Cardiac:RRR; no murmurs, rubs, or gallops  Respiratory:  clear to auscultation bilaterally, normal work of breathing GI: soft, nontender, nondistended, + BS Ext: without cyanosis, clubbing, or edema, Good distal pulses bilaterally MS: no deformity or atrophy  Skin: warm and dry, no rash Neuro:  Alert and Oriented x 3 Psych: euthymic mood, full affect  Wt Readings from Last 3 Encounters:  03/23/20 141 lb 9.6 oz (64.2 kg)  03/02/20 142 lb 12.8 oz (64.8 kg)  02/20/20 146 lb (66.2 kg)      Studies/Labs Reviewed:   EKG:  EKG is not ordered today.  The ekg reviewed from stress test 03/17/20-NSR with LBBB unchanged. Recent Labs: 07/22/2019: BUN 17; Potassium 4.4; Sodium 135 02/18/2020: Creatinine, Ser 1.00   Lipid Panel    Component Value Date/Time   CHOL 112 12/19/2007 0910   TRIG 177 (H) 12/19/2007 0910   HDL 30.2 (L) 12/19/2007 0910   CHOLHDL 3.7 CALC 12/19/2007 0910   VLDL 35 12/19/2007 0910   LDLCALC 46 12/19/2007 0910    Additional studies/ records that were reviewed today include:  2D echo 03/17/2020  IMPRESSIONS     1. Left ventricular ejection fraction, by estimation, is 35 to 40%. The  left ventricle has normal function. The left ventricle demonstrates global  hypokinesis. Left ventricular diastolic parameters are indeterminate.   2. Right ventricular systolic function is mildly reduced. The right  ventricular size is normal. There is normal pulmonary artery systolic  pressure.   3. The mitral valve is normal in structure. No evidence of mitral valve  regurgitation. No evidence of mitral stenosis.   4. The aortic valve is tricuspid. Aortic  valve regurgitation is mild. No  aortic stenosis is present.   5. The inferior vena cava is normal in size with greater than 50%  respiratory variability, suggesting right atrial pressure of 3 mmHg.   FINDINGS   Left Ventricle: Left ventricular ejection fraction, by estimation, is 35  to 40%. The left ventricle has normal function. The left ventricle  demonstrates global hypokinesis. The left ventricular internal cavity size  was normal in size. There is no left  ventricular hypertrophy. Left ventricular diastolic parameters are  indeterminate.   Right Ventricle: The right ventricular size is normal. No increase in  right ventricular wall thickness. Right ventricular systolic function is  mildly reduced. There is normal pulmonary artery systolic pressure. The  tricuspid regurgitant velocity is 2.20  m/s, and with an assumed right atrial pressure of 3 mmHg, the estimated  right ventricular systolic pressure is [id number] mmHg.   Left Atrium: Left atrial size was normal in size.   Right Atrium: Right atrial size was normal in size.   Pericardium: There is no evidence of pericardial effusion.   Mitral Valve: The mitral valve is normal in structure. No evidence of  mitral valve regurgitation. No evidence of mitral valve stenosis.   Tricuspid Valve: The tricuspid valve is normal in structure. Tricuspid  valve regurgitation is mild . No evidence of tricuspid stenosis.   Aortic Valve: The aortic valve is  tricuspid. Aortic valve regurgitation is  mild. No aortic stenosis is present. Aortic valve mean gradient measures  3.4 mmHg. Aortic valve peak gradient measures 7.4 mmHg. Aortic valve area,  by VTI measures 2.36 cm.   Pulmonic Valve: The pulmonic valve was not well visualized. Pulmonic valve  regurgitation is not visualized. No evidence of pulmonic stenosis.   Aorta: The aortic root is normal in size and structure.   Pulmonary Artery: No evidence of pulmonary HTN, PASP is 22 mmHg.    Venous: The inferior vena cava is normal in size with greater than 50%  respiratory variability, suggesting right atrial pressure of 3 mmHg.   IAS/Shunts: No atrial level shunt detected by color flow Doppler.      NST 4/28/2021There was no ST segment deviation noted during stress.  Findings consistent with prior anterior myocardial infarction with moderate peri-infarct ischemia.  This is a high risk study. Risk based on combined LV dysfunction and moderate peri-infarct ischemia.  The left ventricular ejection fraction is moderately decreased (35%).    Cardiac catheterization 2017 1. Mid LAD lesion, 100% stenosed. The lesion was previously treated with a bare metal stent greater than two years ago. 2. Sequential LIMA-D1-LAD was injected is large, and is anatomically normal. Anastomosis is distal to occluded LAD stent 3. Essentially normal right heart cath pressures with normal LVEDP     Based on the evidence of widely patent LIMA graft and native arteries nongrafted, the sudden drop in EF is likely nonischemic in nature. She appears to be relatively euvolemic and adequately diuresed.   Plan:  Sheath removal in Cath Lab with manual pressure for hemostasis.  Return to nursing unit for continued management by primary service.  Expect that she should be ready for discharge soon, pending reevaluation by primary team.           ASSESSMENT:    1. Chest pain, unspecified type   2. Abnormal stress test   3. Coronary artery disease involving coronary bypass graft of native heart with angina pectoris ([place])   4. NICM (nonischemic cardiomyopathy) ([place])   5. Essential hypertension   6. Type 2 diabetes mellitus without complication, without long-term current use of insulin (HCC)   7. LBBB (left bundle branch block)      PLAN:  In order of problems listed above:  Chest pain and dyspnea on exertion with Abnormal NST with prior anterior MI with moderate peri-infarct ischemia EF  35% high risk study based on combined LV dysfunction and moderate peri-infarct ischemia.  Right and left Cardiac catheterization recommended by Dr. [name] Cancel.  Last cath 2017 LIMA to the diagonal and LAD was patent. Reviewed in detail with patient. I have reviewed the risks, indications, and alternatives to angioplasty and stenting with the patient. Risks include but are not limited to bleeding, infection, vascular injury, stroke, myocardial infection, arrhythmia, kidney injury, radiation-related injury in the case of prolonged fluoroscopy use, emergency cardiac surgery, and death. The patient understands the risks of serious complication is low (123456) and patient agrees to proceed.    CAD S/P CABG 1996, cath as above 2017   History of nonischemic cardiomyopathy in 2017 35% improved to 45-50% echo 2018, now 35-40%. Plan to titrate entresto up after cath.   Essential hypertension BP controlled.  Diabetes mellitus managed by PCP  Left bundle branch block-chronic    Medication Adjustments/Labs and Tests Ordered: Current medicines are reviewed at length with the patient today.  Concerns regarding medicines are outlined above.  Medication changes, Labs  and Tests ordered today are listed in the Patient Instructions below. Patient Instructions  Medication Instructions:  Your physician recommends that you continue on your current medications as directed. Please refer to the Current Medication list given to you today.  *If you need a refill on your cardiac medications before your next appointment, please call your pharmacy*   Lab Work: Your physician recommends that you return for lab work in: Today   If you have labs (blood work) drawn today and your tests are completely normal, you will receive your results only by: Marland Kitchen MyChart Message (if you have MyChart) OR . A paper copy in the mail If you have any lab test that is abnormal or we need to change your treatment, we will call you to review the  results.   Testing/Procedures: Your physician has requested that you have a cardiac catheterization. Cardiac catheterization is used to diagnose and/or treat various heart conditions. Doctors may recommend this procedure for a number of different reasons. The most common reason is to evaluate chest pain. Chest pain can be a symptom of coronary artery disease (CAD), and cardiac catheterization can show whether plaque is narrowing or blocking your heart's arteries. This procedure is also used to evaluate the valves, as well as measure the blood flow and oxygen levels in different parts of your heart. For further information please visit [email or web address]. Please follow instruction sheet, as given.     Follow-Up: At [facility], you and your health needs are our priority.  As part of our continuing mission to provide you with exceptional heart care, we have created designated Provider Care Teams.  These Care Teams include your primary Cardiologist (physician) and Advanced Practice Providers (APPs -  Physician Assistants and Nurse Practitioners) who all work together to provide you with the care you need, when you need it.  We recommend signing up for the patient portal called "MyChart".  Sign up information is provided on this After Visit Summary.  MyChart is used to connect with patients for Virtual Visits (Telemedicine).  Patients are able to view lab/test results, encounter notes, upcoming appointments, etc.  Non-urgent messages can be sent to your provider as well.   To learn more about what you can do with MyChart, go to [email or web address].    Your next appointment:   2-3 week(s)  The format for your next appointment:   In Person  Provider:   Ermalinda Barrios, PA-C   Other Instructions Thank you for choosing [facility]!     [address] [place] [address] Dept:  [phone] Loc: [phone]  Japleen Ceja  03/23/2020  You are scheduled for a Cardiac Catheterization on Friday, May 7 with Dr. Glenetta Hew.  1. Please arrive at the [facility] (Main Entrance A) at [facility]: [address] [place], [place] [address] at 10:00 AM (This time is two hours before your procedure to ensure your preparation). Free valet parking service is available.   Special note: Every effort is made to have your procedure done on time. Please understand that emergencies sometimes delay scheduled procedures.  2. Diet: Do not eat solid foods after midnight.  The patient may have clear liquids until 5am upon the day of the procedure.  3. Labs: You will need to have blood drawn on Tuesday, May 4 at [place]. [address], [place]  Open: 7am - 6pm, Sat 8am - 12 noon  Phone: [phone]. You do not need to be fasting.  4. Medication instructions in preparation for your procedure:   Contrast Allergy: No  Stop taking, Lasix (Furosemide)  Friday, May 7,  Do not take Diabetes Med Glucophage (Metformin) on the day of the procedure and HOLD 48 HOURS AFTER THE PROCEDURE.  On the morning of your procedure, take your Aspirin and any morning medicines NOT listed above.  You may use sips of water.  5. Plan for one night stay--bring personal belongings. 6. Bring a current list of your medications and current insurance cards. 7. You MUST have a responsible person to drive you home. 8. Someone MUST be with you the first 24 hours after you arrive home or your discharge will be delayed. 9. Please wear clothes that are easy to get on and off and wear slip-on shoes.  Thank you for allowing [place] to care for you!   -- [facility] Invasive Cardiovascular services       Signed, Ermalinda Barrios, PA-C  03/23/2020 12:20 PM    [address], [place], [place]  [address] Phone: [phone]; Fax: [phone]

## 2020-03-23 ENCOUNTER — Ambulatory Visit: Payer: Medicare Other | Admitting: Physician Assistant

## 2020-03-23 ENCOUNTER — Other Ambulatory Visit (HOSPITAL_COMMUNITY)
Admission: RE | Admit: 2020-03-23 | Discharge: 2020-03-23 | Disposition: A | Discharge status: Home / Self Care | Payer: Medicare Other | Source: Ambulatory Visit | Attending: Physician Assistant | Admitting: Physician Assistant

## 2020-03-23 ENCOUNTER — Other Ambulatory Visit (HOSPITAL_COMMUNITY)
Admission: RE | Admit: 2020-03-23 | Discharge: 2020-03-23 | Disposition: A | Discharge status: Home / Self Care | Payer: Medicare Other | Source: Ambulatory Visit | Attending: Cardiology | Admitting: Cardiology

## 2020-03-23 ENCOUNTER — Encounter: Payer: Self-pay | Admitting: Physician Assistant

## 2020-03-23 ENCOUNTER — Other Ambulatory Visit: Payer: Self-pay

## 2020-03-23 VITALS — BP 118/60 | HR 78 | Temp 96.4°F | Ht 62.5 in | Wt 141.6 lb

## 2020-03-23 DIAGNOSIS — Z20822 Contact with and (suspected) exposure to covid-19: Secondary | ICD-10-CM | POA: Insufficient documentation

## 2020-03-23 DIAGNOSIS — R9439 Abnormal result of other cardiovascular function study: Secondary | ICD-10-CM | POA: Diagnosis not present

## 2020-03-23 DIAGNOSIS — I25709 Atherosclerosis of coronary artery bypass graft(s), unspecified, with unspecified angina pectoris: Secondary | ICD-10-CM

## 2020-03-23 DIAGNOSIS — I428 Other cardiomyopathies: Secondary | ICD-10-CM | POA: Diagnosis not present

## 2020-03-23 DIAGNOSIS — R079 Chest pain, unspecified: Secondary | ICD-10-CM | POA: Insufficient documentation

## 2020-03-23 DIAGNOSIS — E119 Type 2 diabetes mellitus without complications: Secondary | ICD-10-CM

## 2020-03-23 DIAGNOSIS — Z01812 Encounter for preprocedural laboratory examination: Secondary | ICD-10-CM | POA: Insufficient documentation

## 2020-03-23 DIAGNOSIS — I10 Essential (primary) hypertension: Secondary | ICD-10-CM

## 2020-03-23 DIAGNOSIS — I447 Left bundle-branch block, unspecified: Secondary | ICD-10-CM

## 2020-03-23 LAB — CBC
HCT: 33.9 % — ABNORMAL LOW (ref 36.0–46.0)
Hemoglobin: 10.4 g/dL — ABNORMAL LOW (ref 12.0–15.0)
MCH: 30.5 pg (ref 26.0–34.0)
MCHC: 30.7 g/dL (ref 30.0–36.0)
MCV: 99.4 fL (ref 80.0–100.0)
Platelets: 225 10*3/uL (ref 150–400)
RBC: 3.41 MIL/uL — ABNORMAL LOW (ref 3.87–5.11)
RDW: 15.7 % — ABNORMAL HIGH (ref 11.5–15.5)
WBC: 12.8 10*3/uL — ABNORMAL HIGH (ref 4.0–10.5)
nRBC: 0 % (ref 0.0–0.2)

## 2020-03-23 LAB — BASIC METABOLIC PANEL
Anion gap: 13 (ref 5–15)
BUN: 40 mg/dL — ABNORMAL HIGH (ref 8–23)
CO2: 25 mmol/L (ref 22–32)
Calcium: 10 mg/dL (ref 8.9–10.3)
Chloride: 99 mmol/L (ref 98–111)
Creatinine, Ser: 1.03 mg/dL — ABNORMAL HIGH (ref 0.44–1.00)
GFR calc Af Amer: >60 mL/min (ref >60)
GFR calc non Af Amer: 54 mL/min — ABNORMAL LOW (ref >60)
Glucose, Bld: 105 mg/dL — ABNORMAL HIGH (ref 70–99)
Potassium: 4.3 mmol/L (ref 3.5–5.1)
Sodium: 137 mmol/L (ref 135–145)

## 2020-03-23 MED ORDER — SODIUM CHLORIDE 0.9% FLUSH: 3.0000 mL | Freq: Two times a day (BID) | INTRAVENOUS | Status: DC

## 2020-03-23 NOTE — Patient Instructions (Signed)
Medication Instructions:  Your physician recommends that you continue on your current medications as directed. Please refer to the Current Medication list given to you today.  *If you need a refill on your cardiac medications before your next appointment, please call your pharmacy*   Lab Work: Your physician recommends that you return for lab work in: Today   If you have labs (blood work) drawn today and your tests are completely normal, you will receive your results only by: Marland Kitchen MyChart Message (if you have MyChart) OR . A paper copy in the mail If you have any lab test that is abnormal or we need to change your treatment, we will call you to review the results.   Testing/Procedures: Your physician has requested that you have a cardiac catheterization. Cardiac catheterization is used to diagnose and/or treat various heart conditions. Doctors may recommend this procedure for a number of different reasons. The most common reason is to evaluate chest pain. Chest pain can be a symptom of coronary artery disease (CAD), and cardiac catheterization can show whether plaque is narrowing or blocking your heart's arteries. This procedure is also used to evaluate the valves, as well as measure the blood flow and oxygen levels in different parts of your heart. For further information please visit [email or web address]. Please follow instruction sheet, as given.     Follow-Up: At [facility], you and your health needs are our priority.  As part of our continuing mission to provide you with exceptional heart care, we have created designated Provider Care Teams.  These Care Teams include your primary Cardiologist (physician) and Advanced Practice Providers (APPs -  Physician Assistants and Nurse Practitioners) who all work together to provide you with the care you need, when you need it.  We recommend signing up for the patient portal called "MyChart".  Sign up information is provided on this After Visit  Summary.  MyChart is used to connect with patients for Virtual Visits (Telemedicine).  Patients are able to view lab/test results, encounter notes, upcoming appointments, etc.  Non-urgent messages can be sent to your provider as well.   To learn more about what you can do with MyChart, go to [email or web address].    Your next appointment:   2-3 week(s)  The format for your next appointment:   In Person  Provider:   Ermalinda Barrios, PA-C   Other Instructions Thank you for choosing [facility]!     [address] [place] [address] Dept: [phone] Loc: [phone]  Katherine Larson  03/23/2020  You are scheduled for a Cardiac Catheterization on Friday, May 7 with Dr. Glenetta Hew.  1. Please arrive at the [facility] (Main Entrance A) at [facility]: [address] [place], [place] [address] at 10:00 AM (This time is two hours before your procedure to ensure your preparation). Free valet parking service is available.   Special note: Every effort is made to have your procedure done on time. Please understand that emergencies sometimes delay scheduled procedures.  2. Diet: Do not eat solid foods after midnight.  The patient may have clear liquids until 5am upon the day of the procedure.  3. Labs: You will need to have blood drawn on Tuesday, May 4 at [place]. [address], [place]  Open: 7am - 6pm, Sat 8am - 12 noon   Phone: [phone]. You do not need to be fasting.  4. Medication instructions  in preparation for your procedure:   Contrast Allergy: No  Stop taking, Lasix (Furosemide)  Friday, May 7,  Do not take Diabetes Med Glucophage (Metformin) on the day of the procedure and HOLD 48 HOURS AFTER THE PROCEDURE.  On the morning of your procedure, take your Aspirin and any morning medicines NOT listed above.  You may use sips of water.  5. Plan  for one night stay--bring personal belongings. 6. Bring a current list of your medications and current insurance cards. 7. You MUST have a responsible person to drive you home. 8. Someone MUST be with you the first 24 hours after you arrive home or your discharge will be delayed. 9. Please wear clothes that are easy to get on and off and wear slip-on shoes.  Thank you for allowing [place] to care for you!   -- [facility]

## 2020-03-24 LAB — SARS CORONAVIRUS 2 (TAT 6-24 HRS): SARS Coronavirus 2: NEGATIVE

## 2020-03-25 ENCOUNTER — Telehealth: Payer: Self-pay | Admitting: *Deleted

## 2020-03-25 NOTE — Telephone Encounter (Signed)
03/23/20 WBC 12.4-pt denies cough, dysuria, fever, recent infection, or other signs/symptoms of infection.

## 2020-03-25 NOTE — Telephone Encounter (Signed)
Pt contacted pre-catheterization scheduled at [facility] for: Friday Mar 26, 2020 12 noon Verified arrival time and place: [facility]) at: 10 AM   No solid food after midnight prior to cath, clear liquids until 5 AM day of procedure.  Hold: Lasix- AM of procedure Metformin-day of procedure and 48 hours post procedure  Except hold medications AM meds can be  taken pre-cath with sip of water including: ASA 81 mg   Confirmed patient has responsible adult to drive home post procedure and observe 24 hours after arriving home: yes  You are allowed ONE visitor in the waiting room during your procedures. Both you and your visitor must wear masks.      COVID-19 Pre-Screening Questions:  . In the past 7 to 10 days have you had a cough,  shortness of breath, headache, congestion, fever (100 or greater) body aches, chills, sore throat, or sudden loss of taste or sense of smell? no . Have you been around anyone with known Covid 19 in the past 7 to 10 days? no . Have you been around anyone who is awaiting Covid 19 test results in the past 7 to 10 days? no . Have you been around anyone who has mentioned symptoms of Covid 19 within the past 7 to 10 days? no  I reviewed procedure/mask/visitor instructions, COVID-19 screening questions with patient.

## 2020-03-26 ENCOUNTER — Encounter (HOSPITAL_COMMUNITY)
Admission: RE | Disposition: A | Discharge status: Home / Self Care | Payer: Self-pay | Source: Ambulatory Visit | Attending: Cardiology

## 2020-03-26 ENCOUNTER — Ambulatory Visit (HOSPITAL_COMMUNITY)
Admission: RE | Admit: 2020-03-26 | Discharge: 2020-03-26 | Disposition: A | Discharge status: Home / Self Care | Payer: Medicare Other | Source: Ambulatory Visit | Attending: Cardiology | Admitting: Cardiology

## 2020-03-26 ENCOUNTER — Other Ambulatory Visit: Payer: Self-pay

## 2020-03-26 DIAGNOSIS — Z79899 Other long term (current) drug therapy: Secondary | ICD-10-CM | POA: Insufficient documentation

## 2020-03-26 DIAGNOSIS — Z7982 Long term (current) use of aspirin: Secondary | ICD-10-CM | POA: Insufficient documentation

## 2020-03-26 DIAGNOSIS — I2510 Atherosclerotic heart disease of native coronary artery without angina pectoris: Secondary | ICD-10-CM | POA: Diagnosis not present

## 2020-03-26 DIAGNOSIS — E1140 Type 2 diabetes mellitus with diabetic neuropathy, unspecified: Secondary | ICD-10-CM | POA: Insufficient documentation

## 2020-03-26 DIAGNOSIS — E1122 Type 2 diabetes mellitus with diabetic chronic kidney disease: Secondary | ICD-10-CM | POA: Insufficient documentation

## 2020-03-26 DIAGNOSIS — M069 Rheumatoid arthritis, unspecified: Secondary | ICD-10-CM | POA: Insufficient documentation

## 2020-03-26 DIAGNOSIS — R9439 Abnormal result of other cardiovascular function study: Secondary | ICD-10-CM | POA: Diagnosis present

## 2020-03-26 DIAGNOSIS — I5042 Chronic combined systolic (congestive) and diastolic (congestive) heart failure: Secondary | ICD-10-CM | POA: Diagnosis not present

## 2020-03-26 DIAGNOSIS — E785 Hyperlipidemia, unspecified: Secondary | ICD-10-CM | POA: Diagnosis not present

## 2020-03-26 DIAGNOSIS — I252 Old myocardial infarction: Secondary | ICD-10-CM | POA: Insufficient documentation

## 2020-03-26 DIAGNOSIS — I420 Dilated cardiomyopathy: Secondary | ICD-10-CM | POA: Diagnosis not present

## 2020-03-26 DIAGNOSIS — Z87891 Personal history of nicotine dependence: Secondary | ICD-10-CM | POA: Diagnosis not present

## 2020-03-26 DIAGNOSIS — I447 Left bundle-branch block, unspecified: Secondary | ICD-10-CM | POA: Diagnosis not present

## 2020-03-26 DIAGNOSIS — Z951 Presence of aortocoronary bypass graft: Secondary | ICD-10-CM | POA: Insufficient documentation

## 2020-03-26 DIAGNOSIS — Z7984 Long term (current) use of oral hypoglycemic drugs: Secondary | ICD-10-CM | POA: Insufficient documentation

## 2020-03-26 DIAGNOSIS — Z880 Allergy status to penicillin: Secondary | ICD-10-CM | POA: Insufficient documentation

## 2020-03-26 DIAGNOSIS — J45909 Unspecified asthma, uncomplicated: Secondary | ICD-10-CM | POA: Insufficient documentation

## 2020-03-26 DIAGNOSIS — K219 Gastro-esophageal reflux disease without esophagitis: Secondary | ICD-10-CM | POA: Diagnosis not present

## 2020-03-26 DIAGNOSIS — M109 Gout, unspecified: Secondary | ICD-10-CM | POA: Diagnosis not present

## 2020-03-26 DIAGNOSIS — I428 Other cardiomyopathies: Secondary | ICD-10-CM

## 2020-03-26 DIAGNOSIS — N189 Chronic kidney disease, unspecified: Secondary | ICD-10-CM | POA: Insufficient documentation

## 2020-03-26 DIAGNOSIS — E0590 Thyrotoxicosis, unspecified without thyrotoxic crisis or storm: Secondary | ICD-10-CM | POA: Diagnosis not present

## 2020-03-26 DIAGNOSIS — I130 Hypertensive heart and chronic kidney disease with heart failure and stage 1 through stage 4 chronic kidney disease, or unspecified chronic kidney disease: Secondary | ICD-10-CM | POA: Insufficient documentation

## 2020-03-26 DIAGNOSIS — I5022 Chronic systolic (congestive) heart failure: Secondary | ICD-10-CM | POA: Insufficient documentation

## 2020-03-26 DIAGNOSIS — I25709 Atherosclerosis of coronary artery bypass graft(s), unspecified, with unspecified angina pectoris: Secondary | ICD-10-CM | POA: Diagnosis present

## 2020-03-26 HISTORY — PX: RIGHT/LEFT HEART CATH AND CORONARY ANGIOGRAPHY: CATH118266

## 2020-03-26 LAB — POCT I-STAT 7, (LYTES, BLD GAS, ICA,H+H)
Acid-Base Excess: 2 mmol/L (ref 0.0–2.0)
Bicarbonate: 27.1 mmol/L (ref 20.0–28.0)
Calcium, Ion: 1.14 mmol/L — ABNORMAL LOW (ref 1.15–1.40)
HCT: 31 % — ABNORMAL LOW (ref 36.0–46.0)
Hemoglobin: 10.5 g/dL — ABNORMAL LOW (ref 12.0–15.0)
O2 Saturation: 99 %
Potassium: 4.3 mmol/L (ref 3.5–5.1)
Sodium: 140 mmol/L (ref 135–145)
TCO2: 28 mmol/L (ref 22–32)
pCO2 arterial: 41.9 mmHg (ref 32.0–48.0)
pH, Arterial: 7.418 (ref 7.350–7.450)
pO2, Arterial: 132 mmHg — ABNORMAL HIGH (ref 83.0–108.0)

## 2020-03-26 LAB — POCT I-STAT EG7
Acid-Base Excess: 2 mmol/L (ref 0.0–2.0)
Acid-Base Excess: 3 mmol/L — ABNORMAL HIGH (ref 0.0–2.0)
Bicarbonate: 27 mmol/L (ref 20.0–28.0)
Bicarbonate: 28 mmol/L (ref 20.0–28.0)
Calcium, Ion: 1.06 mmol/L — ABNORMAL LOW (ref 1.15–1.40)
Calcium, Ion: 1.09 mmol/L — ABNORMAL LOW (ref 1.15–1.40)
HCT: 30 % — ABNORMAL LOW (ref 36.0–46.0)
HCT: 30 % — ABNORMAL LOW (ref 36.0–46.0)
Hemoglobin: 10.2 g/dL — ABNORMAL LOW (ref 12.0–15.0)
Hemoglobin: 10.2 g/dL — ABNORMAL LOW (ref 12.0–15.0)
O2 Saturation: 70 %
O2 Saturation: 70 %
Potassium: 4.1 mmol/L (ref 3.5–5.1)
Potassium: 4.1 mmol/L (ref 3.5–5.1)
Sodium: 142 mmol/L (ref 135–145)
Sodium: 142 mmol/L (ref 135–145)
TCO2: 28 mmol/L (ref 22–32)
TCO2: 29 mmol/L (ref 22–32)
pCO2, Ven: 44 mmHg (ref 44.0–60.0)
pCO2, Ven: 45.5 mmHg (ref 44.0–60.0)
pH, Ven: 7.396 (ref 7.250–7.430)
pH, Ven: 7.397 (ref 7.250–7.430)
pO2, Ven: 37 mmHg (ref 32.0–45.0)
pO2, Ven: 37 mmHg (ref 32.0–45.0)

## 2020-03-26 LAB — GLUCOSE, CAPILLARY: Glucose-Capillary: 126 mg/dL — ABNORMAL HIGH (ref 70–99)

## 2020-03-26 SURGERY — RIGHT/LEFT HEART CATH AND CORONARY ANGIOGRAPHY
Anesthesia: LOCAL

## 2020-03-26 MED ORDER — FENTANYL CITRATE (PF) 100 MCG/2ML IJ SOLN
INTRAMUSCULAR | Status: AC
  Filled 2020-03-26: qty 2

## 2020-03-26 MED ORDER — MIDAZOLAM HCL 2 MG/2ML IJ SOLN
INTRAMUSCULAR | Status: DC | PRN
  Administered 2020-03-26: 1 mg via INTRAVENOUS

## 2020-03-26 MED ORDER — LIDOCAINE HCL (PF) 1 % IJ SOLN
INTRAMUSCULAR | Status: AC
  Filled 2020-03-26: qty 30

## 2020-03-26 MED ORDER — HEPARIN (PORCINE) IN NACL 1000-0.9 UT/500ML-% IV SOLN
INTRAVENOUS | Status: AC
  Filled 2020-03-26: qty 500

## 2020-03-26 MED ORDER — FENTANYL CITRATE (PF) 100 MCG/2ML IJ SOLN
INTRAMUSCULAR | Status: DC | PRN
  Administered 2020-03-26: 25 ug via INTRAVENOUS

## 2020-03-26 MED ORDER — HEPARIN SODIUM (PORCINE) 1000 UNIT/ML IJ SOLN
INTRAMUSCULAR | Status: DC | PRN
  Administered 2020-03-26: 3500 [IU] via INTRAVENOUS

## 2020-03-26 MED ORDER — MIDAZOLAM HCL 2 MG/2ML IJ SOLN
INTRAMUSCULAR | Status: AC
  Filled 2020-03-26: qty 2

## 2020-03-26 MED ORDER — LABETALOL HCL 5 MG/ML IV SOLN: 10.0000 mg | INTRAVENOUS | Status: DC | PRN

## 2020-03-26 MED ORDER — SODIUM CHLORIDE 0.9% FLUSH: 3.0000 mL | INTRAVENOUS | Status: DC | PRN

## 2020-03-26 MED ORDER — SODIUM CHLORIDE 0.9% FLUSH: 3.0000 mL | Freq: Two times a day (BID) | INTRAVENOUS | Status: DC

## 2020-03-26 MED ORDER — SODIUM CHLORIDE 0.9 % IV SOLN: 250.0000 mL | INTRAVENOUS | Status: DC | PRN

## 2020-03-26 MED ORDER — VERAPAMIL HCL 2.5 MG/ML IV SOLN: INTRAVENOUS | Status: DC | PRN

## 2020-03-26 MED ORDER — HEPARIN (PORCINE) IN NACL 1000-0.9 UT/500ML-% IV SOLN
INTRAVENOUS | Status: DC | PRN
  Administered 2020-03-26 (×2): 500 mL

## 2020-03-26 MED ORDER — ONDANSETRON HCL 4 MG/2ML IJ SOLN: 4.0000 mg | Freq: Four times a day (QID) | INTRAMUSCULAR | Status: DC | PRN

## 2020-03-26 MED ORDER — VERAPAMIL HCL 2.5 MG/ML IV SOLN
INTRAVENOUS | Status: AC
  Filled 2020-03-26: qty 2

## 2020-03-26 MED ORDER — ASPIRIN 81 MG PO CHEW: 81.0000 mg | CHEWABLE_TABLET | ORAL | Status: DC

## 2020-03-26 MED ORDER — IOHEXOL 350 MG/ML SOLN
INTRAVENOUS | Status: DC | PRN
  Administered 2020-03-26: 40 mL

## 2020-03-26 MED ORDER — ACETAMINOPHEN 325 MG PO TABS: 650.0000 mg | ORAL_TABLET | ORAL | Status: DC | PRN

## 2020-03-26 MED ORDER — LIDOCAINE HCL (PF) 1 % IJ SOLN
INTRAMUSCULAR | Status: DC | PRN
  Administered 2020-03-26: 2 mL

## 2020-03-26 MED ORDER — HYDRALAZINE HCL 20 MG/ML IJ SOLN: 10.0000 mg | INTRAMUSCULAR | Status: DC | PRN

## 2020-03-26 MED ORDER — HEPARIN SODIUM (PORCINE) 1000 UNIT/ML IJ SOLN
INTRAMUSCULAR | Status: AC
  Filled 2020-03-26: qty 1

## 2020-03-26 MED ORDER — SODIUM CHLORIDE 0.9 % IV SOLN: INTRAVENOUS | Status: DC

## 2020-03-26 SURGICAL SUPPLY — 13 items
CATH BALLN WEDGE 5F 110CM (CATHETERS) ×1 IMPLANT
CATH INFINITI 5 FR IM (CATHETERS) ×1 IMPLANT
CATH INFINITI JR4 5F (CATHETERS) ×1 IMPLANT
CATH OPTITORQUE TIG 4.0 6F (CATHETERS) ×1 IMPLANT
DEVICE RAD COMP TR BAND LRG (VASCULAR PRODUCTS) ×1 IMPLANT
GLIDESHEATH SLEND SS 6F .021 (SHEATH) ×1 IMPLANT
GUIDEWIRE INQWIRE 1.5J.035X260 (WIRE) IMPLANT
INQWIRE 1.5J .035X260CM (WIRE) ×2
KIT HEART LEFT (KITS) ×2 IMPLANT
PACK CARDIAC CATHETERIZATION (CUSTOM PROCEDURE TRAY) ×2 IMPLANT
SHEATH GLIDE SLENDER 4/5FR (SHEATH) ×1 IMPLANT
TRANSDUCER W/STOPCOCK (MISCELLANEOUS) ×2 IMPLANT
TUBING CIL FLEX 10 FLL-RA (TUBING) ×2 IMPLANT

## 2020-03-26 NOTE — Interval H&P Note (Signed)
History and Physical Interval Note:  03/26/2020 1:01 PM  Katherine Larson  has presented today for surgery, with the diagnosis of Chest Pain - Abnormal Nuclear Stress Test & Reduced LVEF on Echo.  The various methods of treatment have been discussed with the patient and family. After consideration of risks, benefits and other options for treatment, the patient has consented to  Procedure(s): RIGHT/LEFT HEART CATH AND CORONARY ANGIOGRAPHY (N/A)  PERCUTANEOUS CORONARY INTERVENTION   as a surgical intervention.  The patient's history has been reviewed, patient examined, no change in status, stable for surgery.  I have reviewed the patient's chart and labs.  Questions were answered to the patient's satisfaction.    Cath Lab Visit (complete for each Cath Lab visit)  Clinical Evaluation Leading to the Procedure:   ACS: No.  Non-ACS:    Anginal Classification: CCS II  Anti-ischemic medical therapy: Minimal Therapy (1 class of medications)  Non-Invasive Test Results: High-risk stress test findings: cardiac mortality >3%/year  Prior CABG: Previous CABG    Glenetta Hew

## 2020-03-26 NOTE — Discharge Instructions (Signed)
Drink plenty of fluids for 48 hours and keep wrist elevated at heart level for 24 hours  Radial Site Care   This sheet gives you information about how to care for yourself after your procedure. Your health care provider may also give you more specific instructions. If you have problems or questions, contact your health care provider. What can I expect after the procedure? After the procedure, it is common to have:  Bruising and tenderness at the catheter insertion area. Follow these instructions at home: Medicines  Take over-the-counter and prescription medicines only as told by your health care provider. Insertion site care 1. Follow instructions from your health care provider about how to take care of your insertion site. Make sure you: ? Wash your hands with soap and water before you change your bandage (dressing). If soap and water are not available, use hand sanitizer. ? Remove your dressing as told by your health care provider. In 24 hours 2. Check your insertion site every day for signs of infection. Check for: ? Redness, swelling, or pain. ? Fluid or blood. ? Pus or a bad smell. ? Warmth. 3. Do not take baths, swim, or use a hot tub until your health care provider approves. 4. You may shower 24-48 hours after the procedure, or as directed by your health care provider. ? Remove the dressing and gently wash the site with plain soap and water. ? Pat the area dry with a clean towel. ? Do not rub the site. That could cause bleeding. 5. Do not apply powder or lotion to the site. Activity   1. For 24 hours after the procedure, or as directed by your health care provider: ? Do not flex or bend the affected arm. ? Do not push or pull heavy objects with the affected arm. ? Do not drive yourself home from the hospital or clinic. You may drive 24 hours after the procedure unless your health care provider tells you not to. ? Do not operate machinery or power tools. 2. Do not lift  anything that is heavier than 10 lb (4.5 kg), or the limit that you are told, until your health care provider says that it is safe.  For 4 days 3. Ask your health care provider when it is okay to: ? Return to work or school. ? Resume usual physical activities or sports. ? Resume sexual activity. General instructions  If the catheter site starts to bleed, raise your arm and put firm pressure on the site. If the bleeding does not stop, get help right away. This is a medical emergency.  If you went home on the same day as your procedure, a responsible adult should be with you for the first 24 hours after you arrive home.  Keep all follow-up visits as told by your health care provider. This is important. Contact a health care provider if:  You have a fever.  You have redness, swelling, or yellow drainage around your insertion site. Get help right away if:  You have unusual pain at the radial site.  The catheter insertion area swells very fast.  The insertion area is bleeding, and the bleeding does not stop when you hold steady pressure on the area.  Your arm or hand becomes pale, cool, tingly, or numb. These symptoms may represent a serious problem that is an emergency. Do not wait to see if the symptoms will go away. Get medical help right away. Call your local emergency services (911 in the [place] Do   not drive yourself to the hospital. Summary  After the procedure, it is common to have bruising and tenderness at the site.  Follow instructions from your health care provider about how to take care of your radial site wound. Check the wound every day for signs of infection.  Do not lift anything that is heavier than 10 lb (4.5 kg), or the limit that you are told, until your health care provider says that it is safe. This information is not intended to replace advice given to you by your health care provider. Make sure you discuss any questions you have with your health care  provider. Document Revised: 12/12/2017 Document Reviewed: 12/12/2017 Elsevier Patient Education  2020 [employer]  

## 2020-03-26 NOTE — Progress Notes (Signed)
D/C instructions reviewed with pt and pt's husband Gwyndolyn Saxon. Verbalized understanding.

## 2020-03-30 NOTE — Progress Notes (Signed)
Cardiology Office Note    Date:  04/07/2020   ID:  Katherine, Larson 22-Oct-1946, MRN [id number]  PCP:  Harlan Stains, MD  Cardiologist: Jenkins Rouge, MD EPS: None  Chief Complaint  Patient presents with  . Hospitalization Follow-up    History of Present Illness:  Katherine Larson is a 74 y.o. female with history of CAD status post CABG in 1996 with a LIMA to the LAD and diagonal, graft patent on cath in 2017, nonischemic cardiomyopathy EF 35% 2017 improved to 40 to 45% 2018, NSVT, PAT, LBBB, nerve stimulator for chronic back pain, GERD    Patient saw Dr. [name] Cancel 02/20/2020 with episodes of dyspnea and chest tightness.  NST 03/17/2020 prior anterior MI with moderate peri-infarct ischemia, EF 35% high risk study based on LV dysfunction and moderate peri-infarct ischemia.  2D echo 03/17/2020 moderate LV dysfunction EF 35-40% down from last echo.  Dr. [name] Cancel recommends cardiac catheterization to further evaluate.   Cardiac cath 03/26/20 ocluded LAD and patent Cfx. Drop in EF felt to be nonischemic vs microvascular., normal right heart cath, possibly dehydrated.   She is also scheduled for colonoscopy next month and she needs cardiac clearance.  Patient says she is tired/fatigued all the time. No chest pain, shortness of breath, dizziness or presyncope. Cleans house, grocery shops, stays pretty active.     Past Medical History:  Diagnosis Date  . Anemia    anemia of chronic disease +/- IDA followed by Dr. Earlie Server. received iron infusions in the past  . Asthma   . Borderline glaucoma   . CAD (coronary artery disease)    a. s/p CABG 1996. b. low risk nuc 2011. c. LHC 04/2016 due to drop in EF -> occluded native LAD,  widely patent sequential LIMA-D1-LAD.  Marland Kitchen Chronic kidney disease    "mild stage of kidney disease"  . Chronic systolic CHF (congestive heart failure) ([place])   . Complication of anesthesia   . DM2 (diabetes mellitus, type 2) ([place])    not on any medicine for this at  this time, 05/2016  . GERD (gastroesophageal reflux disease)   . Gout   . History of hiatal hernia    in 31s  . History of pneumonia    March, 2016  . HLD (hyperlipidemia)   . HTN (hypertension)   . Hyperthyroidism 01/21/2016  . Inappropriate sinus tachycardia   . LBBB (left bundle branch block)    a. Seen in 04/2016  . Macular degeneration    left  . Myocardial infarction ([place])    1996  . Neuropathy   . NICM (nonischemic cardiomyopathy) ([place])    a. Dx 04/2016 - EF 25-30%, diffuse HK, elevated LVEDP, mild MR, mod LAE.  Marland Kitchen NSVT (nonsustained ventricular tachycardia) ([place])   . PAT (paroxysmal atrial tachycardia) ([place])   . PONV (postoperative nausea and vomiting)    "after just about every surgery I've had"  . Rheumatoid arthritis (HCC)    RA  . Seasonal allergies     Past Surgical History:  Procedure Laterality Date  . ABDOMINAL HYSTERECTOMY    . APPENDECTOMY    . BACK SURGERY    . CARDIAC CATHETERIZATION N/A 05/01/2016   Procedure: Right/Left Heart Cath and Coronary/Graft Angiography;  Surgeon: Leonie Man, MD;  Location: [facility];  Service: Cardiovascular;  Laterality: N/A;  . CHOLECYSTECTOMY    . COLONOSCOPY  11/2011   Dr. Magod:ext/int hemorrhoids, diverticulosis sigmoid colon and distal desc colon, mid-desc colon hyperplastic polyps.   Marland Kitchen  CORONARY ARTERY BYPASS GRAFT  1996   2 vessels  . ESOPHAGOGASTRODUODENOSCOPY  11/2011   Dr. Watt Climes: small hiatal hernia, one non-bleeding superficial gastric ulcer, medium-sized diverticulum in area of papilla  . ESOPHAGOGASTRODUODENOSCOPY N/A 12/29/2015   Dr. Gala Romney: Chronic inactive gastritis, normal esophagus status post dilation, duodenal diverticula  . ESOPHAGOGASTRODUODENOSCOPY (EGD) WITH PROPOFOL N/A 07/24/2019   Dr. Gala Romney: Normal esophagus status post empiric dilation, small hiatal hernia, large D2 diverticulum  . EYE SURGERY Bilateral    cataract removal  . FOOT SURGERY     removal bone spur  . FRACTURE SURGERY Right     arm  . GIVENS CAPSULE STUDY  11/2012   Dr. Watt Climes: minimal gastritis, normal small bowel capsule  . HERNIA REPAIR     hiatal hernia  . MALONEY DILATION N/A 07/24/2019   Procedure: Venia Minks DILATION;  Surgeon: Daneil Dolin, MD;  [facility];  Service: Endoscopy;  Laterality: N/A;  . MAXIMUM ACCESS (MAS)POSTERIOR LUMBAR INTERBODY FUSION (PLIF) 1 LEVEL N/A 08/14/2013   Procedure:  MAXIMUM ACCESS SURGERY(MAS) POSTERIOR LUMBAR INTERBODY FUSION LUMBAR THREE-FOUR ;  Surgeon: Eustace Moore, MD;  Location: [facility];  Service: Neurosurgery;  Laterality: N/A;   MAXIMUM ACCESS SURGERY(MAS) POSTERIOR LUMBAR INTERBODY FUSION LUMBAR THREE-FOUR   . PTCA    . RIGHT/LEFT HEART CATH AND CORONARY ANGIOGRAPHY N/A 03/26/2020   Procedure: RIGHT/LEFT HEART CATH AND CORONARY ANGIOGRAPHY;  Surgeon: Leonie Man, MD;  Location: [facility];  Service: Cardiovascular;  Laterality: N/A;  . SPINAL CORD STIMULATOR INSERTION N/A 06/16/2016   Procedure: LUMBAR SPINAL CORD STIMULATOR INSERTION;  Surgeon: Clydell Hakim, MD;  Location: [facility];  Service: Neurosurgery;  Laterality: N/A;  LUMBAR SPINAL CORD STIMULATOR INSERTION  . tens  05/2016  . TONSILLECTOMY      Current Medications: Current Meds  Medication Sig  . albuterol (PROVENTIL HFA;VENTOLIN HFA) 108 (90 Base) MCG/ACT inhaler Take 2 puffs 3 times a day and every 4 hours as needed. (Patient taking differently: Inhale into the lungs. Take 2 puffs 3 times a day and every 4 hours as needed.)  . allopurinol (ZYLOPRIM) 300 MG tablet Take 300 mg by mouth daily.  Marland Kitchen aspirin EC 81 MG EC tablet Take 1 tablet (81 mg total) by mouth daily. (Patient taking differently: Take 81 mg by mouth every morning. )  . atorvastatin (LIPITOR) 80 MG tablet Take 80 mg by mouth daily.  . calcium carbonate (TUMS - DOSED IN MG ELEMENTAL CALCIUM) 500 MG chewable tablet Chew 1 tablet by mouth as needed for indigestion or heartburn.  . carvedilol (COREG) 12.5 MG tablet TAKE 1  TABLET BY MOUTH TWO  TIMES DAILY (Patient taking differently: Take 12.5 mg by mouth 2 (two) times daily with a meal. )  . cholecalciferol (VITAMIN D3) 25 MCG (1000 UT) tablet Take 1,000 Units by mouth daily.  . cholestyramine (QUESTRAN) 4 GM/DOSE powder Take 1 packet (4 g total) by mouth daily. Do not take within 3 hours of other medications  . fexofenadine (ALLEGRA) 180 MG tablet Take 180 mg by mouth daily.    . fluticasone (FLONASE) 50 MCG/ACT nasal spray Place 2 sprays into both nostrils daily as needed for allergies.   . furosemide (LASIX) 40 MG tablet Take 40 mg by mouth 2 (two) times daily.   Marland Kitchen gabapentin (NEURONTIN) 300 MG capsule Take 300 mg by mouth 3 (three) times daily.   Marland Kitchen HYDROcodone-acetaminophen (NORCO) 7.5-325 MG tablet Take 1 tablet by mouth every 6 (six)  hours as needed for moderate pain.  . Lancets (ONETOUCH DELICA PLUS [id number]) [place]   . loperamide (IMODIUM A-D) 2 MG tablet Take 2 mg by mouth 4 (four) times daily as needed for diarrhea or loose stools.  . magnesium oxide (MAGNESIUM-OXIDE) 400 (241.3 Mg) MG tablet Take 1 tablet (400 mg total) by mouth daily.  . metFORMIN (GLUCOPHAGE) 500 MG tablet Take 1,000 mg by mouth at bedtime. At night  . montelukast (SINGULAIR) 10 MG tablet Take 10 mg by mouth at bedtime.  . Multiple Vitamins-Minerals (MACULAR VITAMIN BENEFIT PO) Take 2 tablets by mouth daily.  . nitroGLYCERIN (NITROSTAT) 0.4 MG SL tablet Place 1 tablet (0.4 mg total) under the tongue every 5 (five) minutes as needed for chest pain. Reported on 03/24/2016  . pantoprazole (PROTONIX) 40 MG tablet Take 1 tablet (40 mg total) by mouth 2 (two) times daily before a meal.  . Polyethyl Glycol-Propyl Glycol (SYSTANE OP) Apply 1 drop to eye 3 (three) times daily as needed (Dry Eyes).  . potassium chloride SA (K-DUR,KLOR-CON) 20 MEQ tablet Take 1 tablet (20 mEq total) by mouth 2 (two) times daily.  . [DISCONTINUED] lisinopril (PRINIVIL,ZESTRIL) 5 MG tablet TAKE 1 TABLET BY MOUTH  DAILY  (Patient taking differently: Take 5 mg by mouth daily. )   Current Facility-Administered Medications for the 04/07/20 encounter (Office Visit) with Imogene Burn, PA-C  Medication  . sodium chloride flush (NS) 0.9 % injection 3 mL     Allergies:   Biaxin [clarithromycin] and Penicillins   Social History   Socioeconomic History  . Marital status: Married    Spouse name: Not on file  . Number of children: 3  . Years of education: Not on file  . Highest education level: Not on file  Occupational History  . Not on file  Tobacco Use  . Smoking status: Former Smoker    Packs/day: 0.75    Years: 15.00    Pack years: 11.25    Types: Cigarettes    Quit date: 11/20/1994    Years since quitting: 25.3  . Smokeless tobacco: Never Used  . Tobacco comment: Quit in 1996  Substance and Sexual Activity  . Alcohol use: No    Alcohol/week: 0.0 standard drinks  . Drug use: No  . Sexual activity: Yes  Other Topics Concern  . Not on file  Social History Narrative   2 living children, one deceased age 3   Social Determinants of Health   Financial Resource Strain:   . Difficulty of Paying Living Expenses:   Food Insecurity:   . Worried About Charity fundraiser in the Last Year:   . Arboriculturist in the Last Year:   Transportation Needs:   . Film/video editor (Medical):   Marland Kitchen Lack of Transportation (Non-Medical):   Physical Activity:   . Days of Exercise per Week:   . Minutes of Exercise per Session:   Stress:   . Feeling of Stress :   Social Connections:   . Frequency of Communication with Friends and Family:   . Frequency of Social Gatherings with Friends and Family:   . Attends Religious Services:   . Active Member of Clubs or Organizations:   . Attends Archivist Meetings:   Marland Kitchen Marital Status:      Family History:  The patient'sfamily history includes Bladder Cancer in her brother; Cervical cancer in her daughter; Heart attack in her father and mother.   ROS:    Please  see the history of present illness.    ROS All other systems reviewed and are negative.   PHYSICAL EXAM:   VS:  BP 118/64   Pulse 86   Ht 5' 2.5" (1.588 m)   Wt 136 lb 12.8 oz (62.1 kg)   SpO2 95%   BMI 24.62 kg/m   Physical Exam  GEN: Well nourished, well developed, in no acute distress  HEENT: normal  Neck: no JVD, carotid bruits, or masses Cardiac:RRR; no murmurs, rubs, or gallops  Respiratory:  clear to auscultation bilaterally, normal work of breathing GI: soft, nontender, nondistended, + BS Ext: without cyanosis, clubbing, or edema, Good distal pulses bilaterally MS: no deformity or atrophy  Skin: warm and dry, no rash Neuro:  Alert and Oriented x 3, Strength and sensation are intact Psych: euthymic mood, full affect  Wt Readings from Last 3 Encounters:  04/07/20 136 lb 12.8 oz (62.1 kg)  03/26/20 136 lb 6.4 oz (61.9 kg)  03/23/20 141 lb 9.6 oz (64.2 kg)      Studies/Labs Reviewed:   EKG:  EKG is not ordered today.    Recent Labs: 03/23/2020: BUN 40; Creatinine, Ser 1.03; Platelets 225 03/26/2020: Hemoglobin 10.2; Hemoglobin 10.2; Potassium 4.1; Potassium 4.1; Sodium 142; Sodium 142   Lipid Panel    Component Value Date/Time   CHOL 112 12/19/2007 0910   TRIG 177 (H) 12/19/2007 0910   HDL 30.2 (L) 12/19/2007 0910   CHOLHDL 3.7 CALC 12/19/2007 0910   VLDL 35 12/19/2007 0910   LDLCALC 46 12/19/2007 0910    Additional studies/ records that were reviewed today include:  Cardiac cath [id number]   LV END DIASTOLIC PRESSURE and PCWP is normal.  There is no aortic valve stenosis.  Dist LM to Mid LAD lesion is 70% stenosed with 30% stenosed side branch in 1st Sept -> leading up to the stent  Previously placed bare-metal stent in the mid LAD is 100% stenosed.  LIMA-LAD graft was visualized by angiography and is normal in caliber. The graft exhibits no disease. The flow is not reversed. There is no competitive flow   SUMMARY  Severe single-vessel disease  with occluded LAD prior to bifurcation into the diagonal branch.  Otherwise widely patent native LCx, RCA and diagonal-LAD that are grafted. ? I suspect that the drop in EF is probably related to a nonischemic etiology or for microvascular ischemia.   ? Nuclear stress test does show mild basal anterior and anteroseptal reversibility in the setting of a large anteroseptal infarct.  I suspect that this is probably due to reduced flow at the site of the occluded LAD stent with minimal upstream septal perforators.  Normal RIGHT HEART CATH pressures with low LVEDP and PCWP indicating mild dehydration -> indicates adequate diuresis  Relatively normal cardiac output and index of 5.41 and 3.24 respectively.     The patient will be discharged home to follow-up with primary cardiologist.     Glenetta Hew, MD  2D echo 03/17/2020  IMPRESSIONS     1. Left ventricular ejection fraction, by estimation, is 35 to 40%. The  left ventricle has normal function. The left ventricle demonstrates global  hypokinesis. Left ventricular diastolic parameters are indeterminate.   2. Right ventricular systolic function is mildly reduced. The right  ventricular size is normal. There is normal pulmonary artery systolic  pressure.   3. The mitral valve is normal in structure. No evidence of mitral valve  regurgitation. No evidence of mitral stenosis.   4. The aortic  valve is tricuspid. Aortic valve regurgitation is mild. No  aortic stenosis is present.   5. The inferior vena cava is normal in size with greater than 50%  respiratory variability, suggesting right atrial pressure of 3 mmHg.   FINDINGS   Left Ventricle: Left ventricular ejection fraction, by estimation, is 35  to 40%. The left ventricle has normal function. The left ventricle  demonstrates global hypokinesis. The left ventricular internal cavity size  was normal in size. There is no left  ventricular hypertrophy. Left ventricular diastolic  parameters are  indeterminate.   Right Ventricle: The right ventricular size is normal. No increase in  right ventricular wall thickness. Right ventricular systolic function is  mildly reduced. There is normal pulmonary artery systolic pressure. The  tricuspid regurgitant velocity is 2.20  m/s, and with an assumed right atrial pressure of 3 mmHg, the estimated  right ventricular systolic pressure is [id number] mmHg.   Left Atrium: Left atrial size was normal in size.   Right Atrium: Right atrial size was normal in size.   Pericardium: There is no evidence of pericardial effusion.   Mitral Valve: The mitral valve is normal in structure. No evidence of  mitral valve regurgitation. No evidence of mitral valve stenosis.   Tricuspid Valve: The tricuspid valve is normal in structure. Tricuspid  valve regurgitation is mild . No evidence of tricuspid stenosis.   Aortic Valve: The aortic valve is tricuspid. Aortic valve regurgitation is  mild. No aortic stenosis is present. Aortic valve mean gradient measures  3.4 mmHg. Aortic valve peak gradient measures 7.4 mmHg. Aortic valve area,  by VTI measures 2.36 cm.   Pulmonic Valve: The pulmonic valve was not well visualized. Pulmonic valve  regurgitation is not visualized. No evidence of pulmonic stenosis.   Aorta: The aortic root is normal in size and structure.   Pulmonary Artery: No evidence of pulmonary HTN, PASP is 22 mmHg.   Venous: The inferior vena cava is normal in size with greater than 50%  respiratory variability, suggesting right atrial pressure of 3 mmHg.   IAS/Shunts: No atrial level shunt detected by color flow Doppler.      NST 4/28/2021There was no ST segment deviation noted during stress.  Findings consistent with prior anterior myocardial infarction with moderate peri-infarct ischemia.  This is a high risk study. Risk based on combined LV dysfunction and moderate peri-infarct ischemia.  The left ventricular ejection  fraction is moderately decreased (35%).     Cardiac catheterization 2017 1. Mid LAD lesion, 100% stenosed. The lesion was previously treated with a bare metal stent greater than two years ago. 2. Sequential LIMA-D1-LAD was injected is large, and is anatomically normal. Anastomosis is distal to occluded LAD stent 3. Essentially normal right heart cath pressures with normal LVEDP     Based on the evidence of widely patent LIMA graft and native arteries nongrafted, the sudden drop in EF is likely nonischemic in nature. She appears to be relatively euvolemic and adequately diuresed.   Plan:  Sheath removal in Cath Lab with manual pressure for hemostasis.  Return to nursing unit for continued management by primary service.  Expect that she should be ready for discharge soon, pending reevaluation by primary team.       ASSESSMENT:    1. Coronary artery disease involving coronary bypass graft of native heart without angina pectoris   2. NICM (nonischemic cardiomyopathy) ([place])   3. Preoperative clearance   4. Essential hypertension   5. Hyperlipidemia, unspecified hyperlipidemia  type   6. Type 2 diabetes mellitus without complication, without long-term current use of insulin (HCC)      PLAN:  In order of problems listed above:  Chest pain and dyspnea on exertion with Abnormal NST with prior anterior MI with moderate peri-infarct ischemia EF 35% high risk study based on combined LV dysfunction and moderate peri-infarct ischemia.  Right and left Cardiac catheterization 03/26/20 drop in EF  Nonischemic vs microvascular. Medical therapy recommended.  No recent chest pain or dyspnea  Cardiomyopathy felt to be either nonischemic or microvascular LVEF 35%(prior NICM 2017 EF 35% improved 45-50% echo 2018)-we will stop lisinopril and start Entresto 24/26 mg twice daily.  Follow-up renal in 1-2 weeks with office visit to see if we can titrate further and plans for follow-up echo in 3 months after  maximum therapy  Preoperative clearance for colonoscopy 04/27/20 Dr. Gala Romney According to the Revised Cardiac Risk Index (RCRI), her Perioperative Risk of Major Cardiac Event is (%): 6.6  Her Functional Capacity in METs is: 4.4 according to the Duke Activity Status Index (DASI).  Patient has reduced heart function but is asymptomatic today and METS is 4.4.  Dr. [name] Cancel approved her to proceed with colonoscopy.  Would use low volume prep with her reduced LV function.   HTN controlled and stable to start Entresto  HLD managed by PCP on atorvastatin  DM2 managed by PCP  Medication Adjustments/Labs and Tests Ordered: Current medicines are reviewed at length with the patient today.  Concerns regarding medicines are outlined above.  Medication changes, Labs and Tests ordered today are listed in the Patient Instructions below. Patient Instructions  Medication Instructions:  Your physician recommends that you continue on your current medications as directed. Please refer to the Current Medication list given to you today. Stop Taking Lisinopril Today  Start Taking Entresto on Saturday Morning   *If you need a refill on your cardiac medications before your next appointment, please call your pharmacy*   Lab Work: Your physician recommends that you return for lab work in: 3 weeks   If you have labs (blood work) drawn today and your tests are completely normal, you will receive your results only by: Marland Kitchen MyChart Message (if you have MyChart) OR . A paper copy in the mail If you have any lab test that is abnormal or we need to change your treatment, we will call you to review the results.   Testing/Procedures: NONE   Follow-Up: At [facility], you and your health needs are our priority.  As part of our continuing mission to provide you with exceptional heart care, we have created designated Provider Care Teams.  These Care Teams include your primary Cardiologist (physician) and Advanced Practice  Providers (APPs -  Physician Assistants and Nurse Practitioners) who all work together to provide you with the care you need, when you need it.  We recommend signing up for the patient portal called "MyChart".  Sign up information is provided on this After Visit Summary.  MyChart is used to connect with patients for Virtual Visits (Telemedicine).  Patients are able to view lab/test results, encounter notes, upcoming appointments, etc.  Non-urgent messages can be sent to your provider as well.   To learn more about what you can do with MyChart, go to [email or web address].    Your next appointment:   4 week(s)  The format for your next appointment:   In Person  Provider:   Ermalinda Barrios, PA-C   Other Instructions Thank you for  choosing [facility]!        Sumner Boast, PA-C  04/07/2020 12:50 PM    [facility] [address], [place], [place]  [address] Phone: [phone]; Fax: [phone]

## 2020-04-05 ENCOUNTER — Telehealth: Payer: Self-pay | Admitting: Gastroenterology

## 2020-04-05 NOTE — Telephone Encounter (Signed)
Following up on previous labs from outside source 01/2020.   WBC 11,200 H/H 10.6/32.6 MCV 94.7 Plt 300,000 cre 1.06 Iron 99 TIBC 423 Iron sat 23  Lab Results  Component Value Date   WBC 12.8 (H) 03/23/2020   HGB 10.2 (L) 03/26/2020   HGB 10.2 (L) 03/26/2020   HCT 30.0 (L) 03/26/2020   HCT 30.0 (L) 03/26/2020   MCV 99.4 03/23/2020   PLT 225 03/23/2020   Lab Results  Component Value Date   CREATININE 1.03 (H) 03/23/2020   BUN 40 (H) 03/23/2020   NA 142 03/26/2020   NA 142 03/26/2020   K 4.1 03/26/2020   K 4.1 03/26/2020   CL 99 03/23/2020   CO2 25 03/23/2020    SHE IS ON SCHEDULE FOR COLONOSCOPY NEXT MONTH. SHE HAD CARDIAC CATH SINCE SEEN HERE.   SEES CARDIOLOGY THIS WEEK. LET'S GET CARDIAC CLEARANCE FOR UPCOMING COLONOSCOPY.

## 2020-04-06 ENCOUNTER — Telehealth: Payer: Self-pay

## 2020-04-06 NOTE — Telephone Encounter (Signed)
Noted fyi to kim and pam

## 2020-04-06 NOTE — Telephone Encounter (Signed)
OK thanks. She is already scheduled for 04/27/20. We can make sure pre-op is aware that cardiac clearance has been provided.

## 2020-04-06 NOTE — Telephone Encounter (Signed)
Ok to have colonoscopy

## 2020-04-06 NOTE — Telephone Encounter (Signed)
Dear Dr. [name] Cancel,  Pt is scheduled for a colonoscopy with Dr. Gala Romney on 04/27/2020. Neil Crouch, PA would like to get cardiac clearance on pt prior to having colonoscopy.

## 2020-04-06 NOTE — Telephone Encounter (Signed)
Addressed by Dr. [name] Cancel.

## 2020-04-06 NOTE — Telephone Encounter (Signed)
Noted. Routing to [facility].

## 2020-04-06 NOTE — Telephone Encounter (Signed)
Message sent to provider 

## 2020-04-07 ENCOUNTER — Ambulatory Visit: Payer: Medicare Other | Admitting: Physician Assistant

## 2020-04-07 ENCOUNTER — Encounter: Payer: Self-pay | Admitting: Physician Assistant

## 2020-04-07 ENCOUNTER — Other Ambulatory Visit: Payer: Self-pay

## 2020-04-07 VITALS — BP 118/64 | HR 86 | Ht 62.5 in | Wt 136.8 lb

## 2020-04-07 DIAGNOSIS — I10 Essential (primary) hypertension: Secondary | ICD-10-CM | POA: Diagnosis not present

## 2020-04-07 DIAGNOSIS — I428 Other cardiomyopathies: Secondary | ICD-10-CM | POA: Diagnosis not present

## 2020-04-07 DIAGNOSIS — E785 Hyperlipidemia, unspecified: Secondary | ICD-10-CM

## 2020-04-07 DIAGNOSIS — I25810 Atherosclerosis of coronary artery bypass graft(s) without angina pectoris: Secondary | ICD-10-CM | POA: Diagnosis not present

## 2020-04-07 DIAGNOSIS — Z01818 Encounter for other preprocedural examination: Secondary | ICD-10-CM

## 2020-04-07 DIAGNOSIS — E119 Type 2 diabetes mellitus without complications: Secondary | ICD-10-CM

## 2020-04-07 MED ORDER — ENTRESTO 24-26 MG PO TABS: 1.0000 | ORAL_TABLET | Freq: Two times a day (BID) | ORAL | 11 refills | Status: DC

## 2020-04-07 NOTE — Patient Instructions (Signed)
Medication Instructions:  Your physician recommends that you continue on your current medications as directed. Please refer to the Current Medication list given to you today. Stop Taking Lisinopril Today  Start Taking Entresto on Saturday Morning   *If you need a refill on your cardiac medications before your next appointment, please call your pharmacy*   Lab Work: Your physician recommends that you return for lab work in: 3 weeks   If you have labs (blood work) drawn today and your tests are completely normal, you will receive your results only by: Marland Kitchen MyChart Message (if you have MyChart) OR . A paper copy in the mail If you have any lab test that is abnormal or we need to change your treatment, we will call you to review the results.   Testing/Procedures: NONE   Follow-Up: At [facility], you and your health needs are our priority.  As part of our continuing mission to provide you with exceptional heart care, we have created designated Provider Care Teams.  These Care Teams include your primary Cardiologist (physician) and Advanced Practice Providers (APPs -  Physician Assistants and Nurse Practitioners) who all work together to provide you with the care you need, when you need it.  We recommend signing up for the patient portal called "MyChart".  Sign up information is provided on this After Visit Summary.  MyChart is used to connect with patients for Virtual Visits (Telemedicine).  Patients are able to view lab/test results, encounter notes, upcoming appointments, etc.  Non-urgent messages can be sent to your provider as well.   To learn more about what you can do with MyChart, go to [email or web address].    Your next appointment:   4 week(s)  The format for your next appointment:   In Person  Provider:   Ermalinda Barrios, PA-C   Other Instructions Thank you for choosing [facility]!

## 2020-04-16 ENCOUNTER — Telehealth: Payer: Self-pay

## 2020-04-16 NOTE — Telephone Encounter (Signed)
[id number] 

## 2020-04-16 NOTE — Telephone Encounter (Signed)
Pt feels recent medication changes is causing a change in urinating.   Please call [phone]  Thanks renee

## 2020-04-20 DIAGNOSIS — K52832 Lymphocytic colitis: Secondary | ICD-10-CM

## 2020-04-20 HISTORY — DX: Lymphocytic colitis: K52.832

## 2020-04-20 NOTE — Telephone Encounter (Signed)
Agree this sounds more like UTI would get back on entresto if UA positive

## 2020-04-20 NOTE — Progress Notes (Signed)
Cardiology Office Note    Date:  05/03/2020   ID:  Katherine Larson, DOB December 22, 1945, MRN [id number]  PCP:  Harlan Stains, MD  Cardiologist: Jenkins Rouge, MD EPS: None  No chief complaint on file.   History of Present Illness:  Katherine Larson is a 74 y.o. female with history of CAD status post CABG in 1996 with a LIMA to the LAD and diagonal, graft patent on cath in 2017, nonischemic cardiomyopathy EF 35% 2017 improved to 40 to 45% 2018, NSVT, PAT, LBBB, nerve stimulator for chronic back pain, GERD    Patient saw Dr. [name] Cancel 02/20/2020 with episodes of dyspnea and chest tightness.  NST 03/17/2020 prior anterior MI with moderate peri-infarct ischemia, EF 35% high risk study based on LV dysfunction and moderate peri-infarct ischemia.  2D echo 03/17/2020 moderate LV dysfunction EF 35-40% down from last echo.  Dr. [name] Cancel recommends cardiac catheterization to further evaluate.    Cardiac cath 03/26/20 occluded LAD and patent Cfx. Drop in EF felt to be nonischemic vs microvascular., normal right heart cath, possibly dehydrated.   I saw the patient 04/07/20 and stopped lisinopril and started Entresto.  Patient comes in for f/u. She took it for 1 week and then developed a UTI. She is in the donut hole already. Doesn't think she can afford it. Tolerating it well.   Past Medical History:  Diagnosis Date  . Anemia    anemia of chronic disease +/- IDA followed by Dr. Earlie Server. received iron infusions in the past  . Asthma   . Borderline glaucoma   . CAD (coronary artery disease)    a. s/p CABG 1996. b. low risk nuc 2011. c. LHC 04/2016 due to drop in EF -> occluded native LAD,  widely patent sequential LIMA-D1-LAD.  Marland Kitchen Chronic kidney disease    "mild stage of kidney disease"  . Chronic systolic CHF (congestive heart failure) ([place])   . Complication of anesthesia   . DM2 (diabetes mellitus, type 2) ([facility])    not on any medicine for this at this time, 05/2016  . GERD (gastroesophageal reflux disease)    . Gout   . History of hiatal hernia    in 8s  . History of pneumonia    March, 2016  . HLD (hyperlipidemia)   . HTN (hypertension)   . Hyperthyroidism 01/21/2016  . Inappropriate sinus tachycardia   . LBBB (left bundle branch block)    a. Seen in 04/2016  . Macular degeneration    left  . Myocardial infarction ([place])    1996  . Neuropathy   . NICM (nonischemic cardiomyopathy) ([place])    a. Dx 04/2016 - EF 25-30%, diffuse HK, elevated LVEDP, mild MR, mod LAE.  Marland Kitchen NSVT (nonsustained ventricular tachycardia) ([place])   . PAT (paroxysmal atrial tachycardia) ([place])   . PONV (postoperative nausea and vomiting)    "after just about every surgery I've had"  . Rheumatoid arthritis (HCC)    RA  . Seasonal allergies     Past Surgical History:  Procedure Laterality Date  . ABDOMINAL HYSTERECTOMY    . APPENDECTOMY    . BACK SURGERY    . BIOPSY  04/27/2020   Procedure: BIOPSY;  Surgeon: Daneil Dolin, MD;  [facility];  Service: Endoscopy;;  ascending colon biopsy  . CARDIAC CATHETERIZATION N/A 05/01/2016   Procedure: Right/Left Heart Cath and Coronary/Graft Angiography;  Surgeon: Leonie Man, MD;  Location: [facility];  Service: Cardiovascular;  Laterality: N/A;  .  CHOLECYSTECTOMY    . COLONOSCOPY  11/2011   Dr. Magod:ext/int hemorrhoids, diverticulosis sigmoid colon and distal desc colon, mid-desc colon hyperplastic polyps.   . COLONOSCOPY WITH PROPOFOL N/A 04/27/2020   Procedure: COLONOSCOPY WITH PROPOFOL;  Surgeon: Daneil Dolin, MD;  [facility];  Service: Endoscopy;  Laterality: N/A;  8:30am  . CORONARY ARTERY BYPASS GRAFT  1996   2 vessels  . ESOPHAGOGASTRODUODENOSCOPY  11/2011   Dr. Watt Climes: small hiatal hernia, one non-bleeding superficial gastric ulcer, medium-sized diverticulum in area of papilla  . ESOPHAGOGASTRODUODENOSCOPY N/A 12/29/2015   Dr. Gala Romney: Chronic inactive gastritis, normal esophagus status post dilation, duodenal diverticula  .  ESOPHAGOGASTRODUODENOSCOPY (EGD) WITH PROPOFOL N/A 07/24/2019   Dr. Gala Romney: Normal esophagus status post empiric dilation, small hiatal hernia, large D2 diverticulum  . EYE SURGERY Bilateral    cataract removal  . FOOT SURGERY     removal bone spur  . FRACTURE SURGERY Right    arm  . GIVENS CAPSULE STUDY  11/2012   Dr. Watt Climes: minimal gastritis, normal small bowel capsule  . HERNIA REPAIR     hiatal hernia  . MALONEY DILATION N/A 07/24/2019   Procedure: Venia Minks DILATION;  Surgeon: Daneil Dolin, MD;  [facility];  Service: Endoscopy;  Laterality: N/A;  . MAXIMUM ACCESS (MAS)POSTERIOR LUMBAR INTERBODY FUSION (PLIF) 1 LEVEL N/A 08/14/2013   Procedure:  MAXIMUM ACCESS SURGERY(MAS) POSTERIOR LUMBAR INTERBODY FUSION LUMBAR THREE-FOUR ;  Surgeon: Eustace Moore, MD;  Location: [facility];  Service: Neurosurgery;  Laterality: N/A;   MAXIMUM ACCESS SURGERY(MAS) POSTERIOR LUMBAR INTERBODY FUSION LUMBAR THREE-FOUR   . PTCA    . RIGHT/LEFT HEART CATH AND CORONARY ANGIOGRAPHY N/A 03/26/2020   Procedure: RIGHT/LEFT HEART CATH AND CORONARY ANGIOGRAPHY;  Surgeon: Leonie Man, MD;  Location: [facility];  Service: Cardiovascular;  Laterality: N/A;  . SPINAL CORD STIMULATOR INSERTION N/A 06/16/2016   Procedure: LUMBAR SPINAL CORD STIMULATOR INSERTION;  Surgeon: Clydell Hakim, MD;  Location: [facility];  Service: Neurosurgery;  Laterality: N/A;  LUMBAR SPINAL CORD STIMULATOR INSERTION  . tens  05/2016  . TONSILLECTOMY      Current Medications: Current Meds  Medication Sig  . albuterol (PROVENTIL HFA;VENTOLIN HFA) 108 (90 Base) MCG/ACT inhaler Take 2 puffs 3 times a day and every 4 hours as needed.  Marland Kitchen allopurinol (ZYLOPRIM) 300 MG tablet Take 300 mg by mouth daily.  Marland Kitchen aspirin EC 81 MG EC tablet Take 1 tablet (81 mg total) by mouth daily.  Marland Kitchen atorvastatin (LIPITOR) 80 MG tablet Take 80 mg by mouth daily.  . calcium carbonate (TUMS - DOSED IN MG ELEMENTAL CALCIUM) 500 MG chewable tablet  Chew 1 tablet by mouth as needed for indigestion or heartburn.  . carvedilol (COREG) 12.5 MG tablet TAKE 1 TABLET BY MOUTH TWO  TIMES DAILY  . cholecalciferol (VITAMIN D3) 25 MCG (1000 UT) tablet Take 1,000 Units by mouth daily.  . cholestyramine (QUESTRAN) 4 GM/DOSE powder Take 1 packet (4 g total) by mouth daily. Do not take within 3 hours of other medications  . fexofenadine (ALLEGRA) 180 MG tablet Take 180 mg by mouth daily.    . fluticasone (FLONASE) 50 MCG/ACT nasal spray Place 2 sprays into both nostrils daily as needed for allergies.   . furosemide (LASIX) 40 MG tablet Take 40 mg by mouth 2 (two) times daily.   Marland Kitchen gabapentin (NEURONTIN) 300 MG capsule Take 300 mg by mouth 3 (three) times daily.   Marland Kitchen HYDROcodone-acetaminophen (  NORCO) 7.5-325 MG tablet Take 1 tablet by mouth every 6 (six) hours.   . iron polysaccharides (NIFEREX) 150 MG capsule Take 150 mg by mouth daily.  . Lancets (ONETOUCH DELICA PLUS [id number]) [place]   . leflunomide (ARAVA) 20 MG tablet Take 20 mg by mouth daily.  Marland Kitchen loperamide (IMODIUM A-D) 2 MG tablet Take 2 mg by mouth 4 (four) times daily as needed for diarrhea or loose stools.  . magnesium oxide (MAGNESIUM-OXIDE) 400 (241.3 Mg) MG tablet Take 1 tablet (400 mg total) by mouth daily.  . meclizine (ANTIVERT) 12.5 MG tablet Take 12.5 mg by mouth 3 (three) times daily as needed for dizziness.  . metFORMIN (GLUCOPHAGE) 500 MG tablet Take 500 mg by mouth in the morning and at bedtime.   . montelukast (SINGULAIR) 10 MG tablet Take 10 mg by mouth at bedtime.  . Multiple Vitamins-Minerals (OCUVITE ADULT 50+ PO) Take 1 tablet by mouth daily.  . nitroGLYCERIN (NITROSTAT) 0.4 MG SL tablet Place 1 tablet (0.4 mg total) under the tongue every 5 (five) minutes as needed for chest pain. Reported on 03/24/2016  . pantoprazole (PROTONIX) 40 MG tablet Take 1 tablet (40 mg total) by mouth 2 (two) times daily before a meal.  . Polyethyl Glycol-Propyl Glycol (SYSTANE OP) Apply 1 drop to eye 3  (three) times daily as needed ([place]).  . potassium chloride SA (K-DUR,KLOR-CON) 20 MEQ tablet Take 1 tablet (20 mEq total) by mouth 2 (two) times daily.  Marland Kitchen triamcinolone cream (KENALOG) 0.1 % Apply 1 application topically daily as needed (for irritation).   . [DISCONTINUED] sacubitril-valsartan (ENTRESTO) 24-26 MG Take 1 tablet by mouth 2 (two) times daily.   Current Facility-Administered Medications for the 05/03/20 encounter (Office Visit) with Imogene Burn, PA-C  Medication  . sodium chloride flush (NS) 0.9 % injection 3 mL     Allergies:   Biaxin [clarithromycin] and Penicillins   Social History   Socioeconomic History  . Marital status: Married    Spouse name: Not on file  . Number of children: 3  . Years of education: Not on file  . Highest education level: Not on file  Occupational History  . Not on file  Tobacco Use  . Smoking status: Former Smoker    Packs/day: 0.75    Years: 15.00    Pack years: 11.25    Types: Cigarettes    Quit date: 11/20/1994    Years since quitting: 25.4  . Smokeless tobacco: Never Used  . Tobacco comment: Quit in 1996  Vaping Use  . Vaping Use: Never used  Substance and Sexual Activity  . Alcohol use: No    Alcohol/week: 0.0 standard drinks  . Drug use: No  . Sexual activity: Yes  Other Topics Concern  . Not on file  Social History Narrative   2 living children, one deceased age 42   Social Determinants of Health   Financial Resource Strain:   . Difficulty of Paying Living Expenses:   Food Insecurity:   . Worried About Charity fundraiser in the Last Year:   . Arboriculturist in the Last Year:   Transportation Needs:   . Film/video editor (Medical):   Marland Kitchen Lack of Transportation (Non-Medical):   Physical Activity:   . Days of Exercise per Week:   . Minutes of Exercise per Session:   Stress:   . Feeling of Stress :   Social Connections:   . Frequency of Communication with Friends and Family:   .  Frequency of Social  Gatherings with Friends and Family:   . Attends Religious Services:   . Active Member of Clubs or Organizations:   . Attends Archivist Meetings:   Marland Kitchen Marital Status:      Family History:  The patient's family history includes Bladder Cancer in her brother; Cervical cancer in her daughter; Heart attack in her father and mother.   ROS:   Please see the history of present illness.    ROS All other systems reviewed and are negative.   PHYSICAL EXAM:   VS:  BP 120/60   Pulse 85   Ht 5' 2.5" (1.588 m)   Wt 141 lb (64 kg)   SpO2 95%   BMI 25.38 kg/m   Physical Exam  GEN: Well nourished, well developed, in no acute distress  Neck: no JVD, carotid bruits, or masses Cardiac:RRR; no murmurs, rubs, or gallops  Respiratory:  clear to auscultation bilaterally, normal work of breathing GI: soft, nontender, nondistended, + BS Ext: without cyanosis, clubbing, or edema, Good distal pulses bilaterally Neuro:  Alert and Oriented x 3 Psych: euthymic mood, full affect  Wt Readings from Last 3 Encounters:  05/03/20 141 lb (64 kg)  04/27/20 136 lb (61.7 kg)  04/23/20 137 lb (62.1 kg)      Studies/Labs Reviewed:   EKG:  EKG is not ordered today.   Recent Labs: 03/23/2020: Platelets 225 03/26/2020: Hemoglobin 10.2; Hemoglobin 10.2 04/23/2020: BUN 22; Creatinine, Ser 0.73; Potassium 3.8; Sodium 138   Lipid Panel    Component Value Date/Time   CHOL 112 12/19/2007 0910   TRIG 177 (H) 12/19/2007 0910   HDL 30.2 (L) 12/19/2007 0910   CHOLHDL 3.7 CALC 12/19/2007 0910   VLDL 35 12/19/2007 0910   LDLCALC 46 12/19/2007 0910    Additional studies/ records that were reviewed today include:  Cardiac cath 03/26/20    LV END DIASTOLIC PRESSURE and PCWP is normal.  There is no aortic valve stenosis.  Dist LM to Mid LAD lesion is 70% stenosed with 30% stenosed side branch in 1st Sept -> leading up to the stent  Previously placed bare-metal stent in the mid LAD is 100%  stenosed.  LIMA-LAD graft was visualized by angiography and is normal in caliber. The graft exhibits no disease. The flow is not reversed. There is no competitive flow   SUMMARY  Severe single-vessel disease with occluded LAD prior to bifurcation into the diagonal branch.  Otherwise widely patent native LCx, RCA and diagonal-LAD that are grafted. ? I suspect that the drop in EF is probably related to a nonischemic etiology or for microvascular ischemia.   ? Nuclear stress test does show mild basal anterior and anteroseptal reversibility in the setting of a large anteroseptal infarct.  I suspect that this is probably due to reduced flow at the site of the occluded LAD stent with minimal upstream septal perforators.  Normal RIGHT HEART CATH pressures with low LVEDP and PCWP indicating mild dehydration -> indicates adequate diuresis  Relatively normal cardiac output and index of 5.41 and 3.24 respectively.     The patient will be discharged home to follow-up with primary cardiologist.     Glenetta Hew, MD   2D echo 03/17/2020  IMPRESSIONS     1. Left ventricular ejection fraction, by estimation, is 35 to 40%. The  left ventricle has normal function. The left ventricle demonstrates global  hypokinesis. Left ventricular diastolic parameters are indeterminate.   2. Right ventricular systolic function is mildly  reduced. The right  ventricular size is normal. There is normal pulmonary artery systolic  pressure.   3. The mitral valve is normal in structure. No evidence of mitral valve  regurgitation. No evidence of mitral stenosis.   4. The aortic valve is tricuspid. Aortic valve regurgitation is mild. No  aortic stenosis is present.   5. The inferior vena cava is normal in size with greater than 50%  respiratory variability, suggesting right atrial pressure of 3 mmHg.   FINDINGS   Left Ventricle: Left ventricular ejection fraction, by estimation, is 35  to 40%. The left ventricle has  normal function. The left ventricle  demonstrates global hypokinesis. The left ventricular internal cavity size  was normal in size. There is no left  ventricular hypertrophy. Left ventricular diastolic parameters are  indeterminate.   Right Ventricle: The right ventricular size is normal. No increase in  right ventricular wall thickness. Right ventricular systolic function is  mildly reduced. There is normal pulmonary artery systolic pressure. The  tricuspid regurgitant velocity is 2.20  m/s, and with an assumed right atrial pressure of 3 mmHg, the estimated  right ventricular systolic pressure is [id number] mmHg.   Left Atrium: Left atrial size was normal in size.   Right Atrium: Right atrial size was normal in size.   Pericardium: There is no evidence of pericardial effusion.   Mitral Valve: The mitral valve is normal in structure. No evidence of  mitral valve regurgitation. No evidence of mitral valve stenosis.   Tricuspid Valve: The tricuspid valve is normal in structure. Tricuspid  valve regurgitation is mild . No evidence of tricuspid stenosis.   Aortic Valve: The aortic valve is tricuspid. Aortic valve regurgitation is  mild. No aortic stenosis is present. Aortic valve mean gradient measures  3.4 mmHg. Aortic valve peak gradient measures 7.4 mmHg. Aortic valve area,  by VTI measures 2.36 cm.   Pulmonic Valve: The pulmonic valve was not well visualized. Pulmonic valve  regurgitation is not visualized. No evidence of pulmonic stenosis.   Aorta: The aortic root is normal in size and structure.   Pulmonary Artery: No evidence of pulmonary HTN, PASP is 22 mmHg.   Venous: The inferior vena cava is normal in size with greater than 50%  respiratory variability, suggesting right atrial pressure of 3 mmHg.   IAS/Shunts: No atrial level shunt detected by color flow Doppler.      NST 4/28/2021There was no ST segment deviation noted during stress.  Findings consistent with prior  anterior myocardial infarction with moderate peri-infarct ischemia.  This is a high risk study. Risk based on combined LV dysfunction and moderate peri-infarct ischemia.  The left ventricular ejection fraction is moderately decreased (35%).     Cardiac catheterization 2017 1. Mid LAD lesion, 100% stenosed. The lesion was previously treated with a bare metal stent greater than two years ago. 2. Sequential LIMA-D1-LAD was injected is large, and is anatomically normal. Anastomosis is distal to occluded LAD stent 3. Essentially normal right heart cath pressures with normal LVEDP     Based on the evidence of widely patent LIMA graft and native arteries nongrafted, the sudden drop in EF is likely nonischemic in nature. She appears to be relatively euvolemic and adequately diuresed.   Plan:  Sheath removal in Cath Lab with manual pressure for hemostasis.  Return to nursing unit for continued management by primary service.  Expect that she should be ready for discharge soon, pending reevaluation by primary team.  ASSESSMENT:    1. Chest pain, unspecified type   2. NICM (nonischemic cardiomyopathy) ([place])   3. Essential hypertension   4. Hyperlipidemia, unspecified hyperlipidemia type   5. Type 2 diabetes mellitus without complication, without long-term current use of insulin (HCC)      PLAN:  In order of problems listed above:  Chest pain and dyspnea on exertion with Abnormal NST with prior anterior MI with moderate peri-infarct ischemia EF 35% high risk study based on combined LV dysfunction and moderate peri-infarct ischemia.  Right and left Cardiac catheterization 03/26/20 drop in EF  Nonischemic vs microvascular. Medical therapy recommended.  No recent chest pain or dyspnea   Cardiomyopathy felt to be either nonischemic or microvascular LVEF 35%(prior NICM 2017 EF 35% improved 45-50% echo 2018)-on   Entresto 24/26 mg twice daily.  will increase to 49/51 mg bid. Will give  her samples and have her apply for assistance. If she doesn't qualify, we will have to put her back on lisinopril. Check renal in 1-2 weeks.   HTN controlled and stable    HLD managed by PCP on atorvastatin   DM2 managed by PCP     Medication Adjustments/Labs and Tests Ordered: Current medicines are reviewed at length with the patient today.  Concerns regarding medicines are outlined above.  Medication changes, Labs and Tests ordered today are listed in the Patient Instructions below. Patient Instructions  Medication Instructions:  Your physician has recommended you make the following change in your medication:  Increase Entresto to 49-51 mg Two times Daily   *If you need a refill on your cardiac medications before your next appointment, please call your pharmacy*   Lab Work: Your physician recommends that you return for lab work in: BMET in 2 Weeks   If you have labs (blood work) drawn today and your tests are completely normal, you will receive your results only by: Marland Kitchen MyChart Message (if you have MyChart) OR . A paper copy in the mail If you have any lab test that is abnormal or we need to change your treatment, we will call you to review the results.   Testing/Procedures: NONE   Follow-Up: At [facility], you and your health needs are our priority.  As part of our continuing mission to provide you with exceptional heart care, we have created designated Provider Care Teams.  These Care Teams include your primary Cardiologist (physician) and Advanced Practice Providers (APPs -  Physician Assistants and Nurse Practitioners) who all work together to provide you with the care you need, when you need it.  We recommend signing up for the patient portal called "MyChart".  Sign up information is provided on this After Visit Summary.  MyChart is used to connect with patients for Virtual Visits (Telemedicine).  Patients are able to view lab/test results, encounter notes, upcoming  appointments, etc.  Non-urgent messages can be sent to your provider as well.   To learn more about what you can do with MyChart, go to [email or web address].    Your next appointment:   3 week(s)  The format for your next appointment:   In Person  Provider:   Ermalinda Barrios, PA-C   Other Instructions Thank you for choosing [facility]!       Sumner Boast, PA-C  05/03/2020 11:25 AM    [facility] [place], [place], [place]  [address] Phone: [phone]; Fax: [phone]

## 2020-04-20 NOTE — Patient Instructions (Signed)
Your procedure is scheduled on: 04/27/2020  Report to [facility] at    7:00 AM.  Call this number if you have problems the morning of surgery: [phone]   Remember:              Follow Directions on the letter you received from Your Physician's office regarding the Bowel Prep              No Smoking the day of Procedure :   Take these medicines the morning of surgery with A SIP OF WATER: Coreg, allergra, flonase, gabapentin, pantoprazole and entresto   Do not wear jewelry, make-up or nail polish.    Do not bring valuables to the hospital.  Contacts, dentures or bridgework may not be worn into surgery.  .   Patients discharged the day of surgery will not be allowed to drive home.     Colonoscopy, Adult, Care After This sheet gives you information about how to care for yourself after your procedure. Your health care provider may also give you more specific instructions. If you have problems or questions, contact your health care provider. What can I expect after the procedure? After the procedure, it is common to have:  A small amount of blood in your stool for 24 hours after the procedure.  Some gas.  Mild abdominal cramping or bloating.  Follow these instructions at home: General instructions   For the first 24 hours after the procedure: ? Do not drive or use machinery. ? Do not sign important documents. ? Do not drink alcohol. ? Do your regular daily activities at a slower pace than normal. ? Eat soft, easy-to-digest foods. ? Rest often.  Take over-the-counter or prescription medicines only as told by your health care provider.  It is up to you to get the results of your procedure. Ask your health care provider, or the department performing the procedure, when your results will be ready. Relieving cramping and bloating  Try walking around when you have cramps or feel bloated.  Apply heat to your abdomen as told by your health care provider. Use a heat source  that your health care provider recommends, such as a moist heat pack or a heating pad. ? Place a towel between your skin and the heat source. ? Leave the heat on for 20-30 minutes. ? Remove the heat if your skin turns bright red. This is especially important if you are unable to feel pain, heat, or cold. You may have a greater risk of getting burned. Eating and drinking  Drink enough fluid to keep your urine clear or pale yellow.  Resume your normal diet as instructed by your health care provider. Avoid heavy or fried foods that are hard to digest.  Avoid drinking alcohol for as long as instructed by your health care provider. Contact a health care provider if:  You have blood in your stool 2-3 days after the procedure. Get help right away if:  You have more than a small spotting of blood in your stool.  You pass large blood clots in your stool.  Your abdomen is swollen.  You have nausea or vomiting.  You have a fever.  You have increasing abdominal pain that is not relieved with medicine. This information is not intended to replace advice given to you by your health care provider. Make sure you discuss any questions you have with your health care provider. Document Released: 06/20/2004 Document Revised: 07/31/2016 Document Reviewed: 01/18/2016 Elsevier Interactive Patient  Education  2018 [employer].

## 2020-04-20 NOTE — Telephone Encounter (Signed)
Patient states she has had bladder fullness and scant urination with burning since last week. She felt it was related to the [facility] and stopped taking it over the weekend. She says she urinated slightly more today but still has burning.I encouraged her to go to her pcp and get a U/A and that I would inform Dr.Nishan.

## 2020-04-21 NOTE — Telephone Encounter (Signed)
Please give pt a call-- needs to know where specimen needs to go

## 2020-04-21 NOTE — Telephone Encounter (Signed)
PCP is ordering U/A. Pt will call me back later today with what they tell her/

## 2020-04-23 ENCOUNTER — Other Ambulatory Visit: Payer: Self-pay

## 2020-04-23 ENCOUNTER — Encounter (HOSPITAL_COMMUNITY)
Admission: RE | Admit: 2020-04-23 | Discharge: 2020-04-23 | Disposition: A | Discharge status: Home / Self Care | Payer: Medicare Other | Source: Ambulatory Visit | Attending: Internal Medicine | Admitting: Internal Medicine

## 2020-04-23 ENCOUNTER — Other Ambulatory Visit (HOSPITAL_COMMUNITY)
Admission: RE | Admit: 2020-04-23 | Discharge: 2020-04-23 | Disposition: A | Discharge status: Home / Self Care | Payer: Medicare Other | Source: Ambulatory Visit | Attending: Internal Medicine | Admitting: Internal Medicine

## 2020-04-23 DIAGNOSIS — Z01812 Encounter for preprocedural laboratory examination: Secondary | ICD-10-CM | POA: Diagnosis present

## 2020-04-23 DIAGNOSIS — Z20822 Contact with and (suspected) exposure to covid-19: Secondary | ICD-10-CM | POA: Insufficient documentation

## 2020-04-23 LAB — BASIC METABOLIC PANEL
Anion gap: 12 (ref 5–15)
BUN: 22 mg/dL (ref 8–23)
CO2: 23 mmol/L (ref 22–32)
Calcium: 9.1 mg/dL (ref 8.9–10.3)
Chloride: 103 mmol/L (ref 98–111)
Creatinine, Ser: 0.73 mg/dL (ref 0.44–1.00)
GFR calc Af Amer: >60 mL/min (ref >60)
GFR calc non Af Amer: >60 mL/min (ref >60)
Glucose, Bld: 117 mg/dL — ABNORMAL HIGH (ref 70–99)
Potassium: 3.8 mmol/L (ref 3.5–5.1)
Sodium: 138 mmol/L (ref 135–145)

## 2020-04-23 LAB — SARS CORONAVIRUS 2 (TAT 6-24 HRS): SARS Coronavirus 2: NEGATIVE

## 2020-04-23 NOTE — Progress Notes (Signed)
Patient called to short stay day surgery with request for her colonoscopy preparation to start Sunday to be called in.  She reports "I went to [facility] on [address] and they have no record of the prescription sent".  "I tried to call office and it is closed".  WIll discuss with Dr. Gala Romney.

## 2020-04-27 ENCOUNTER — Encounter: Payer: Self-pay | Admitting: Internal Medicine

## 2020-04-27 ENCOUNTER — Encounter (HOSPITAL_COMMUNITY)
Admission: RE | Disposition: A | Discharge status: Home / Self Care | Payer: Self-pay | Source: Home / Self Care | Attending: Internal Medicine

## 2020-04-27 ENCOUNTER — Ambulatory Visit (HOSPITAL_COMMUNITY)
Admission: RE | Admit: 2020-04-27 | Discharge: 2020-04-27 | Disposition: A | Discharge status: Home / Self Care | Payer: Medicare Other | Attending: Internal Medicine | Admitting: Internal Medicine

## 2020-04-27 ENCOUNTER — Ambulatory Visit (HOSPITAL_COMMUNITY): Payer: Medicare Other | Admitting: Anesthesiology

## 2020-04-27 ENCOUNTER — Encounter (HOSPITAL_COMMUNITY): Payer: Self-pay | Admitting: Internal Medicine

## 2020-04-27 ENCOUNTER — Other Ambulatory Visit: Payer: Self-pay

## 2020-04-27 DIAGNOSIS — I252 Old myocardial infarction: Secondary | ICD-10-CM | POA: Diagnosis not present

## 2020-04-27 DIAGNOSIS — K5730 Diverticulosis of large intestine without perforation or abscess without bleeding: Secondary | ICD-10-CM | POA: Diagnosis not present

## 2020-04-27 DIAGNOSIS — M069 Rheumatoid arthritis, unspecified: Secondary | ICD-10-CM | POA: Diagnosis not present

## 2020-04-27 DIAGNOSIS — Z881 Allergy status to other antibiotic agents status: Secondary | ICD-10-CM | POA: Insufficient documentation

## 2020-04-27 DIAGNOSIS — Z7982 Long term (current) use of aspirin: Secondary | ICD-10-CM | POA: Diagnosis not present

## 2020-04-27 DIAGNOSIS — I2510 Atherosclerotic heart disease of native coronary artery without angina pectoris: Secondary | ICD-10-CM | POA: Diagnosis not present

## 2020-04-27 DIAGNOSIS — Z880 Allergy status to penicillin: Secondary | ICD-10-CM | POA: Insufficient documentation

## 2020-04-27 DIAGNOSIS — Z79899 Other long term (current) drug therapy: Secondary | ICD-10-CM | POA: Diagnosis not present

## 2020-04-27 DIAGNOSIS — K640 First degree hemorrhoids: Secondary | ICD-10-CM | POA: Insufficient documentation

## 2020-04-27 DIAGNOSIS — E785 Hyperlipidemia, unspecified: Secondary | ICD-10-CM | POA: Insufficient documentation

## 2020-04-27 DIAGNOSIS — J45909 Unspecified asthma, uncomplicated: Secondary | ICD-10-CM | POA: Diagnosis not present

## 2020-04-27 DIAGNOSIS — I428 Other cardiomyopathies: Secondary | ICD-10-CM | POA: Diagnosis not present

## 2020-04-27 DIAGNOSIS — M109 Gout, unspecified: Secondary | ICD-10-CM | POA: Insufficient documentation

## 2020-04-27 DIAGNOSIS — K529 Noninfective gastroenteritis and colitis, unspecified: Secondary | ICD-10-CM

## 2020-04-27 DIAGNOSIS — E0590 Thyrotoxicosis, unspecified without thyrotoxic crisis or storm: Secondary | ICD-10-CM | POA: Diagnosis not present

## 2020-04-27 DIAGNOSIS — I130 Hypertensive heart and chronic kidney disease with heart failure and stage 1 through stage 4 chronic kidney disease, or unspecified chronic kidney disease: Secondary | ICD-10-CM | POA: Insufficient documentation

## 2020-04-27 DIAGNOSIS — Z87891 Personal history of nicotine dependence: Secondary | ICD-10-CM | POA: Diagnosis not present

## 2020-04-27 DIAGNOSIS — E1122 Type 2 diabetes mellitus with diabetic chronic kidney disease: Secondary | ICD-10-CM | POA: Diagnosis not present

## 2020-04-27 DIAGNOSIS — K52832 Lymphocytic colitis: Secondary | ICD-10-CM | POA: Diagnosis not present

## 2020-04-27 DIAGNOSIS — Z951 Presence of aortocoronary bypass graft: Secondary | ICD-10-CM | POA: Insufficient documentation

## 2020-04-27 DIAGNOSIS — Z7984 Long term (current) use of oral hypoglycemic drugs: Secondary | ICD-10-CM | POA: Diagnosis not present

## 2020-04-27 DIAGNOSIS — N189 Chronic kidney disease, unspecified: Secondary | ICD-10-CM | POA: Diagnosis not present

## 2020-04-27 DIAGNOSIS — Z86010 Personal history of colonic polyps: Secondary | ICD-10-CM | POA: Diagnosis not present

## 2020-04-27 DIAGNOSIS — I5022 Chronic systolic (congestive) heart failure: Secondary | ICD-10-CM | POA: Diagnosis not present

## 2020-04-27 DIAGNOSIS — Z8249 Family history of ischemic heart disease and other diseases of the circulatory system: Secondary | ICD-10-CM | POA: Insufficient documentation

## 2020-04-27 DIAGNOSIS — K219 Gastro-esophageal reflux disease without esophagitis: Secondary | ICD-10-CM | POA: Diagnosis not present

## 2020-04-27 HISTORY — PX: BIOPSY: SHX5522

## 2020-04-27 HISTORY — PX: COLONOSCOPY WITH PROPOFOL: SHX5780

## 2020-04-27 LAB — GLUCOSE, CAPILLARY: Glucose-Capillary: 160 mg/dL — ABNORMAL HIGH (ref 70–99)

## 2020-04-27 SURGERY — COLONOSCOPY WITH PROPOFOL
Anesthesia: General

## 2020-04-27 MED ORDER — PROPOFOL 10 MG/ML IV BOLUS
INTRAVENOUS | Status: AC
  Filled 2020-04-27: qty 40

## 2020-04-27 MED ORDER — PROPOFOL 500 MG/50ML IV EMUL
INTRAVENOUS | Status: DC | PRN
  Administered 2020-04-27: 150 ug/kg/min via INTRAVENOUS

## 2020-04-27 MED ORDER — LACTATED RINGERS IV SOLN: Freq: Once | INTRAVENOUS | Status: AC

## 2020-04-27 MED ORDER — CHLORHEXIDINE GLUCONATE CLOTH 2 % EX PADS: 6.0000 | MEDICATED_PAD | Freq: Once | CUTANEOUS | Status: DC

## 2020-04-27 MED ORDER — LACTATED RINGERS IV SOLN
INTRAVENOUS | Status: DC | PRN
Start: 2020-04-27 — End: 2020-04-27

## 2020-04-27 MED ORDER — PROPOFOL 10 MG/ML IV BOLUS
INTRAVENOUS | Status: DC | PRN
  Administered 2020-04-27 (×2): 20 mg via INTRAVENOUS

## 2020-04-27 NOTE — Discharge Instructions (Signed)
Diverticulosis  Diverticulosis is a condition that develops when small pouches (diverticula) form in the wall of the large intestine (colon). The colon is where water is absorbed and stool (feces) is formed. The pouches form when the inside layer of the colon pushes through weak spots in the outer layers of the colon. You may have a few pouches or many of them. The pouches usually do not cause problems unless they become inflamed or infected. When this happens, the condition is called diverticulitis. What are the causes? The cause of this condition is not known. What increases the risk? The following factors may make you more likely to develop this condition:  Being older than age 18. Your risk for this condition increases with age. Diverticulosis is rare among people younger than age 47. By age 73, many people have it.  Eating a low-fiber diet.  Having frequent constipation.  Being overweight.  Not getting enough exercise.  Smoking.  Taking over-the-counter pain medicines, like aspirin and ibuprofen.  Having a family history of diverticulosis. What are the signs or symptoms? In most people, there are no symptoms of this condition. If you do have symptoms, they may include:  Bloating.  Cramps in the abdomen.  Constipation or diarrhea.  Pain in the lower left side of the abdomen. How is this diagnosed? Because diverticulosis usually has no symptoms, it is most often diagnosed during an exam for other colon problems. The condition may be diagnosed by:  Using a flexible scope to examine the colon (colonoscopy).  Taking an X-ray of the colon after dye has been put into the colon (barium enema).  Having a CT scan. How is this treated? You may not need treatment for this condition. Your health care provider may recommend treatment to prevent problems. You may need treatment if you have symptoms or if you previously had diverticulitis. Treatment may include:  Eating a high-fiber  diet.  Taking a fiber supplement.  Taking a live bacteria supplement (probiotic).  Taking medicine to relax your colon. Follow these instructions at home: Medicines  Take over-the-counter and prescription medicines only as told by your health care provider.  If told by your health care provider, take a fiber supplement or probiotic. Constipation prevention Your condition may cause constipation. To prevent or treat constipation, you may need to:  Drink enough fluid to keep your urine pale yellow.  Take over-the-counter or prescription medicines.  Eat foods that are high in fiber, such as beans, whole grains, and fresh fruits and vegetables.  Limit foods that are high in fat and processed sugars, such as fried or sweet foods.  General instructions  Try not to strain when you have a bowel movement.  Keep all follow-up visits as told by your health care provider. This is important. Contact a health care provider if you:  Have pain in your abdomen.  Have bloating.  Have cramps.  Have not had a bowel movement in 3 days. Get help right away if:  Your pain gets worse.  Your bloating becomes very bad.  You have a fever or chills, and your symptoms suddenly get worse.  You vomit.  You have bowel movements that are bloody or black.  You have bleeding from your rectum. Summary  Diverticulosis is a condition that develops when small pouches (diverticula) form in the wall of the large intestine (colon).  You may have a few pouches or many of them.  This condition is most often diagnosed during an exam for other colon  problems.  Treatment may include increasing the fiber in your diet, taking supplements, or taking medicines. This information is not intended to replace advice given to you by your health care provider. Make sure you discuss any questions you have with your health care provider. Document Revised: 06/05/2019 Document Reviewed: 06/05/2019 Elsevier Patient  Education  [place] After These instructions provide you with information about caring for yourself after your procedure. Your health care provider may also give you more specific instructions. Your treatment has been planned according to current medical practices, but problems sometimes occur. Call your health care provider if you have any problems or questions after your procedure. What can I expect after the procedure? After your procedure, you may:  Feel sleepy for several hours.  Feel clumsy and have poor balance for several hours.  Feel forgetful about what happened after the procedure.  Have poor judgment for several hours.  Feel nauseous or vomit.  Have a sore throat if you had a breathing tube during the procedure. Follow these instructions at home: For at least 24 hours after the procedure:      Have a responsible adult stay with you. It is important to have someone help care for you until you are awake and alert.  Rest as needed.  Do not: ? Participate in activities in which you could fall or become injured. ? Drive. ? Use heavy machinery. ? Drink alcohol. ? Take sleeping pills or medicines that cause drowsiness. ? Make important decisions or sign legal documents. ? Take care of children on your own. Eating and drinking  Follow the diet that is recommended by your health care provider.  If you vomit, drink water, juice, or soup when you can drink without vomiting.  Make sure you have little or no nausea before eating solid foods. General instructions  Take over-the-counter and prescription medicines only as told by your health care provider.  If you have sleep apnea, surgery and certain medicines can increase your risk for breathing problems. Follow instructions from your health care provider about wearing your sleep device: ? Anytime you are sleeping, including during daytime naps. ? While taking prescription  pain medicines, sleeping medicines, or medicines that make you drowsy.  If you smoke, do not smoke without supervision.  Keep all follow-up visits as told by your health care provider. This is important. Contact a health care provider if:  You keep feeling nauseous or you keep vomiting.  You feel light-headed.  You develop a rash.  You have a fever. Get help right away if:  You have trouble breathing. Summary  For several hours after your procedure, you may feel sleepy and have poor judgment.  Have a responsible adult stay with you for at least 24 hours or until you are awake and alert. This information is not intended to replace advice given to you by your health care provider. Make sure you discuss any questions you have with your health care provider. Document Revised: 02/04/2018 Document Reviewed: 02/27/2016 Elsevier Patient Education  [place].   Colonoscopy Discharge Instructions  Read the instructions outlined below and refer to this sheet in the next few weeks. These discharge instructions provide you with general information on caring for yourself after you leave the hospital. Your doctor may also give you specific instructions. While your treatment has been planned according to the most current medical practices available, unavoidable complications occasionally occur. If you have any problems or questions after  discharge, call Dr. Gala Romney at [phone]. ACTIVITY  You may resume your regular activity, but move at a slower pace for the next 24 hours.   Take frequent rest periods for the next 24 hours.   Walking will help get rid of the air and reduce the bloated feeling in your belly (abdomen).   No driving for 24 hours (because of the medicine (anesthesia) used during the test).    Do not sign any important legal documents or operate any machinery for 24 hours (because of the anesthesia used during the test).  NUTRITION  Drink plenty of fluids.   You may  resume your normal diet as instructed by your doctor.   Begin with a light meal and progress to your normal diet. Heavy or fried foods are harder to digest and may make you feel sick to your stomach (nauseated).   Avoid alcoholic beverages for 24 hours or as instructed.  MEDICATIONS  You may resume your normal medications unless your doctor tells you otherwise.  WHAT YOU CAN EXPECT TODAY  Some feelings of bloating in the abdomen.   Passage of more gas than usual.   Spotting of blood in your stool or on the toilet paper.  IF YOU HAD POLYPS REMOVED DURING THE COLONOSCOPY:  No aspirin products for 7 days or as instructed.   No alcohol for 7 days or as instructed.   Eat a soft diet for the next 24 hours.  FINDING OUT THE RESULTS OF YOUR TEST Not all test results are available during your visit. If your test results are not back during the visit, make an appointment with your caregiver to find out the results. Do not assume everything is normal if you have not heard from your caregiver or the medical facility. It is important for you to follow up on all of your test results.  SEEK IMMEDIATE MEDICAL ATTENTION IF:  You have more than a spotting of blood in your stool.   Your belly is swollen (abdominal distention).   You are nauseated or vomiting.   You have a temperature over 101.   You have abdominal pain or discomfort that is severe or gets worse throughout the day.    Colon diverticulosis information provided  I took samples of your colon to check for the cause of diarrhea today  I do not recommend a future colonoscopy unless new symptoms develop.  At patient request, I called Ross Ludwig at [phone] and reviewed results

## 2020-04-27 NOTE — Transfer of Care (Signed)
Immediate Anesthesia Transfer of Care Note  Patient: Katherine Larson  Procedure(s) Performed: COLONOSCOPY WITH PROPOFOL (N/A ) BIOPSY  Patient Location: PACU  Anesthesia Type:General  Level of Consciousness: awake  Airway & Oxygen Therapy: Patient Spontanous Breathing  Post-op Assessment: Report given to RN  Post vital signs: Reviewed  Last Vitals:  Vitals Value Taken Time  BP 71/32 04/27/20 0846  Temp    Pulse 91 04/27/20 0848  Resp 17 04/27/20 0848  SpO2 94 % 04/27/20 0848  Vitals shown include unvalidated device data.  Last Pain:  Vitals:   04/27/20 0822  TempSrc:   PainSc: 0-No pain      Patients Stated Pain Goal: 5 (32/00/94 1791)  Complications: No apparent anesthesia complications

## 2020-04-27 NOTE — H&P (Signed)
@LOGO @   Primary Care Physician:  Harlan Stains, MD Primary Gastroenterologist:  Dr. Gala Romney  Pre-Procedure History & Physical: HPI:  Katherine Larson is a 74 y.o. female here for diagnostic colonoscopy.  Chronic diarrhea.  Some improvement with Questran and then it stopped working.  Past Medical History:  Diagnosis Date  . Anemia    anemia of chronic disease +/- IDA followed by Dr. Earlie Server. received iron infusions in the past  . Asthma   . Borderline glaucoma   . CAD (coronary artery disease)    a. s/p CABG 1996. b. low risk nuc 2011. c. LHC 04/2016 due to drop in EF -> occluded native LAD,  widely patent sequential LIMA-D1-LAD.  Marland Kitchen Chronic kidney disease    "mild stage of kidney disease"  . Chronic systolic CHF (congestive heart failure) ([place])   . Complication of anesthesia   . DM2 (diabetes mellitus, type 2) ([place])    not on any medicine for this at this time, 05/2016  . GERD (gastroesophageal reflux disease)   . Gout   . History of hiatal hernia    in 14s  . History of pneumonia    March, 2016  . HLD (hyperlipidemia)   . HTN (hypertension)   . Hyperthyroidism 01/21/2016  . Inappropriate sinus tachycardia   . LBBB (left bundle branch block)    a. Seen in 04/2016  . Macular degeneration    left  . Myocardial infarction ([place])    1996  . Neuropathy   . NICM (nonischemic cardiomyopathy) ([place])    a. Dx 04/2016 - EF 25-30%, diffuse HK, elevated LVEDP, mild MR, mod LAE.  Marland Kitchen NSVT (nonsustained ventricular tachycardia) ([place])   . PAT (paroxysmal atrial tachycardia) ([place])   . PONV (postoperative nausea and vomiting)    "after just about every surgery I've had"  . Rheumatoid arthritis ([facility])    RA  . Seasonal allergies     Past Surgical History:  Procedure Laterality Date  . ABDOMINAL HYSTERECTOMY    . APPENDECTOMY    . BACK SURGERY    . CARDIAC CATHETERIZATION N/A 05/01/2016   Procedure: Right/Left Heart Cath and Coronary/Graft Angiography;  Surgeon: Leonie Man, MD;   Location: [place] [facility];  Service: Cardiovascular;  Laterality: N/A;  . CHOLECYSTECTOMY    . COLONOSCOPY  11/2011   Dr. Magod:ext/int hemorrhoids, diverticulosis sigmoid colon and distal desc colon, mid-desc colon hyperplastic polyps.   . CORONARY ARTERY BYPASS GRAFT  1996   2 vessels  . ESOPHAGOGASTRODUODENOSCOPY  11/2011   Dr. Watt Climes: small hiatal hernia, one non-bleeding superficial gastric ulcer, medium-sized diverticulum in area of papilla  . ESOPHAGOGASTRODUODENOSCOPY N/A 12/29/2015   Dr. Gala Romney: Chronic inactive gastritis, normal esophagus status post dilation, duodenal diverticula  . ESOPHAGOGASTRODUODENOSCOPY (EGD) WITH PROPOFOL N/A 07/24/2019   Dr. Gala Romney: Normal esophagus status post empiric dilation, small hiatal hernia, large D2 diverticulum  . EYE SURGERY Bilateral    cataract removal  . FOOT SURGERY     removal bone spur  . FRACTURE SURGERY Right    arm  . GIVENS CAPSULE STUDY  11/2012   Dr. Watt Climes: minimal gastritis, normal small bowel capsule  . HERNIA REPAIR     hiatal hernia  . MALONEY DILATION N/A 07/24/2019   Procedure: Venia Minks DILATION;  Surgeon: Daneil Dolin, MD;  [facility];  Service: Endoscopy;  Laterality: N/A;  . MAXIMUM ACCESS (MAS)POSTERIOR LUMBAR INTERBODY FUSION (PLIF) 1 LEVEL N/A 08/14/2013   Procedure:  MAXIMUM ACCESS SURGERY(MAS) POSTERIOR LUMBAR  INTERBODY FUSION LUMBAR THREE-FOUR ;  Surgeon: Eustace Moore, MD;  Location: [place] [facility];  Service: Neurosurgery;  Laterality: N/A;   MAXIMUM ACCESS SURGERY(MAS) POSTERIOR LUMBAR INTERBODY FUSION LUMBAR THREE-FOUR   . PTCA    . RIGHT/LEFT HEART CATH AND CORONARY ANGIOGRAPHY N/A 03/26/2020   Procedure: RIGHT/LEFT HEART CATH AND CORONARY ANGIOGRAPHY;  Surgeon: Leonie Man, MD;  Location: [place] [facility];  Service: Cardiovascular;  Laterality: N/A;  . SPINAL CORD STIMULATOR INSERTION N/A 06/16/2016   Procedure: LUMBAR SPINAL CORD STIMULATOR INSERTION;  Surgeon: Clydell Hakim, MD;  Location: [place] [facility];  Service: Neurosurgery;  Laterality: N/A;  LUMBAR SPINAL CORD STIMULATOR INSERTION  . tens  05/2016  . TONSILLECTOMY      Prior to Admission medications   Medication Sig Start Date End Date Taking? Authorizing Provider  albuterol (PROVENTIL HFA;VENTOLIN HFA) 108 (90 Base) MCG/ACT inhaler Take 2 puffs 3 times a day and every 4 hours as needed. Patient taking differently: Inhale into the lungs. Take 2 puffs 3 times a day and every 4 hours as needed. 01/24/16  Yes Rexene Alberts, MD  allopurinol (ZYLOPRIM) 300 MG tablet Take 300 mg by mouth daily.   Yes [provider]  aspirin EC 81 MG EC tablet Take 1 tablet (81 mg total) by mouth daily. 05/03/16  Yes Kilroy, Luke K, PA-C  atorvastatin (LIPITOR) 80 MG tablet Take 80 mg by mouth daily.   Yes [provider]  calcium carbonate (TUMS - DOSED IN MG ELEMENTAL CALCIUM) 500 MG chewable tablet Chew 1 tablet by mouth as needed for indigestion or heartburn.   Yes [provider]  carvedilol (COREG) 12.5 MG tablet TAKE 1 TABLET BY MOUTH TWO  TIMES DAILY Patient taking differently: Take 12.5 mg by mouth 2 (two) times daily with a meal.  01/28/19  Yes Josue Hector, MD  cholecalciferol (VITAMIN D3) 25 MCG (1000 UT) tablet Take 1,000 Units by mouth daily.   Yes [provider]  cholestyramine Lucrezia Starch) 4 GM/DOSE powder Take 1 packet (4 g total) by mouth daily. Do not take within 3 hours of other medications Patient taking differently: Take 4 g by mouth daily with lunch. Do not take within 3 hours of other medications 03/02/20 04/13/21 Yes Mahala Menghini, PA-C  fexofenadine (ALLEGRA) 180 MG tablet Take 180 mg by mouth daily.     Yes [provider]  fluticasone (FLONASE) 50 MCG/ACT nasal spray Place 2 sprays into both nostrils daily as needed for allergies.    Yes [provider]  furosemide (LASIX) 40 MG tablet Take 40 mg by mouth 2 (two) times daily.    Yes [provider]  gabapentin  (NEURONTIN) 300 MG capsule Take 300 mg by mouth 3 (three) times daily.  03/01/18  Yes [provider]  HYDROcodone-acetaminophen (NORCO) 7.5-325 MG tablet Take 1 tablet by mouth every 6 (six) hours.    Yes [provider]  iron polysaccharides (NIFEREX) 150 MG capsule Take 150 mg by mouth daily.   Yes [provider]  leflunomide (ARAVA) 20 MG tablet Take 20 mg by mouth daily.   Yes [provider]  loperamide (IMODIUM A-D) 2 MG tablet Take 2 mg by mouth 4 (four) times daily as needed for diarrhea or loose stools.   Yes [provider]  magnesium oxide (MAGNESIUM-OXIDE) 400 (241.3 Mg) MG tablet Take 1 tablet (400 mg total) by mouth daily. 10/28/19  Yes Josue Hector, MD  meclizine (ANTIVERT) 12.5 MG  tablet Take 12.5 mg by mouth 3 (three) times daily as needed for dizziness.   Yes [provider]  metFORMIN (GLUCOPHAGE) 500 MG tablet Take 500 mg by mouth in the morning and at bedtime.    Yes [provider]  montelukast (SINGULAIR) 10 MG tablet Take 10 mg by mouth at bedtime.   Yes [provider]  Multiple Vitamins-Minerals (OCUVITE ADULT 50+ PO) Take 1 tablet by mouth daily.   Yes [provider]  pantoprazole (PROTONIX) 40 MG tablet Take 1 tablet (40 mg total) by mouth 2 (two) times daily before a meal. 09/02/19  Yes Mahala Menghini, PA-C  Polyethyl Glycol-Propyl Glycol (SYSTANE OP) Apply 1 drop to eye 3 (three) times daily as needed (Dry Eyes).   Yes [provider]  potassium chloride SA (K-DUR,KLOR-CON) 20 MEQ tablet Take 1 tablet (20 mEq total) by mouth 2 (two) times daily. 05/03/16  Yes Kilroy, Luke K, PA-C  sacubitril-valsartan (ENTRESTO) 24-26 MG Take 1 tablet by mouth 2 (two) times daily. 04/07/20  Yes Imogene Burn, PA-C  triamcinolone cream (KENALOG) 0.1 % Apply 1 application topically daily as needed (for irritation).    Yes [provider]  Lancets (ONETOUCH DELICA PLUS [id number]) [place]   01/06/19   [provider]  nitroGLYCERIN (NITROSTAT) 0.4 MG SL tablet Place 1 tablet (0.4 mg total) under the tongue every 5 (five) minutes as needed for chest pain. Reported on 03/24/2016 06/22/17   Evans Lance, MD    Allergies as of 03/02/2020 - Review Complete 03/02/2020  Allergen Reaction Noted  . Biaxin [clarithromycin] Rash and Other (See Comments) 02/02/2010  . Penicillins Rash and Other (See Comments) 02/02/2010    Family History  Problem Relation Age of Onset  . Heart attack Father   . Heart attack Mother   . Bladder Cancer Brother   . Cervical cancer Daughter        ?  . Colon cancer Neg Hx     Social History   Socioeconomic History  . Marital status: Married    Spouse name: Not on file  . Number of children: 3  . Years of education: Not on file  . Highest education level: Not on file  Occupational History  . Not on file  Tobacco Use  . Smoking status: Former Smoker    Packs/day: 0.75    Years: 15.00    Pack years: 11.25    Types: Cigarettes    Quit date: 11/20/1994    Years since quitting: 25.4  . Smokeless tobacco: Never Used  . Tobacco comment: Quit in 1996  Substance and Sexual Activity  . Alcohol use: No    Alcohol/week: 0.0 standard drinks  . Drug use: No  . Sexual activity: Yes  Other Topics Concern  . Not on file  Social History Narrative   2 living children, one deceased age 29   Social Determinants of Health   Financial Resource Strain:   . Difficulty of Paying Living Expenses:   Food Insecurity:   . Worried About Charity fundraiser in the Last Year:   . Arboriculturist in the Last Year:   Transportation Needs:   . Film/video editor (Medical):   Marland Kitchen Lack of Transportation (Non-Medical):   Physical Activity:   . Days of Exercise per Week:   . Minutes of Exercise per Session:   Stress:   . Feeling of Stress :   Social Connections:   . Frequency of Communication  with Friends and Family:   . Frequency of Social Gatherings  with Friends and Family:   . Attends Religious Services:   . Active Member of Clubs or Organizations:   . Attends Archivist Meetings:   Marland Kitchen Marital Status:   Intimate Partner Violence:   . Fear of Current or Ex-Partner:   . Emotionally Abused:   Marland Kitchen Physically Abused:   . Sexually Abused:     Review of Systems: See HPI, otherwise negative ROS  Physical Exam: BP 101/62   Pulse 100   Temp 99.2 F (37.3 C) (Oral)   Resp 18   Ht 5' 2.5" (1.588 m)   Wt 61.7 kg   SpO2 94%   BMI 24.48 kg/m  General:   Alert,  Well-developed, well-nourished, pleasant and cooperative in NAD Mouth:  No deformity or lesions. Neck:  Supple; no masses or thyromegaly. No significant cervical adenopathy. Lungs:  Clear throughout to auscultation.   No wheezes, crackles, or rhonchi. No acute distress. Heart:  Regular rate and rhythm; no murmurs, clicks, rubs,  or gallops. Abdomen: Non-distended, normal bowel sounds.  Soft and nontender without appreciable mass or hepatosplenomegaly.  Pulses:  Normal pulses noted. Extremities:  Without clubbing or edema.  Impression/Plan: 74 year old lady with chronic diarrhea.  Here for colonoscopy to further evaluate per plan.  The risks, benefits, limitations, alternatives and imponderables have been reviewed with the patient. Questions have been answered. All parties are agreeable.     Notice: This dictation was prepared with Dragon dictation along with smaller phrase technology. Any transcriptional errors that result from this process are unintentional and may not be corrected upon review.

## 2020-04-27 NOTE — Anesthesia Postprocedure Evaluation (Signed)
Anesthesia Post Note  Patient: Katherine Larson  Procedure(s) Performed: COLONOSCOPY WITH PROPOFOL (N/A ) BIOPSY  Patient location during evaluation: PACU Anesthesia Type: General Level of consciousness: awake and alert Pain management: pain level controlled Vital Signs Assessment: post-procedure vital signs reviewed and stable Respiratory status: spontaneous breathing Cardiovascular status: blood pressure returned to baseline and stable Postop Assessment: no apparent nausea or vomiting Anesthetic complications: no     Last Vitals:  Vitals:   04/27/20 0845 04/27/20 0852  BP:  (!) 87/55  Pulse: 94   Resp: 18   Temp: 36.6 C   SpO2: 94%     Last Pain:  Vitals:   04/27/20 0845  TempSrc:   PainSc: 0-No pain                 Dakin Madani

## 2020-04-27 NOTE — Anesthesia Preprocedure Evaluation (Signed)
Anesthesia Evaluation  Patient identified by MRN, date of birth, ID band Patient awake    Reviewed: Allergy & Precautions, NPO status , Patient's Chart, lab work & pertinent test results, reviewed documented beta blocker date and time   History of Anesthesia Complications (+) PONV and history of anesthetic complications  Airway Mallampati: II  TM Distance: >3 FB Neck ROM: Full    Dental  (+) Edentulous Upper   Pulmonary shortness of breath and with exertion, asthma , pneumonia, COPD, former smoker,    Pulmonary exam normal breath sounds clear to auscultation       Cardiovascular Exercise Tolerance: Poor METS (SEVERE BACK PAIN): hypertension, Pt. on medications and Pt. on home beta blockers + CAD, + Past MI and +CHF (EF 35-40%)  Normal cardiovascular exam+ dysrhythmias Atrial Fibrillation and Ventricular Tachycardia  Rhythm:Regular Rate:Normal  1. Left ventricular ejection fraction, by estimation, is 35 to 40%. The  left ventricle has normal function. The left ventricle demonstrates global  hypokinesis. Left ventricular diastolic parameters are indeterminate.  2. Right ventricular systolic function is mildly reduced. The right  ventricular size is normal. There is normal pulmonary artery systolic  pressure.  3. The mitral valve is normal in structure. No evidence of mitral valve  regurgitation. No evidence of mitral stenosis.  4. The aortic valve is tricuspid. Aortic valve regurgitation is mild. No  aortic stenosis is present.  5. The inferior vena cava is normal in size with greater than 50%  respiratory variability, suggesting right atrial pressure of 3 mmHg.    Neuro/Psych  Headaches, negative psych ROS   GI/Hepatic hiatal hernia, GERD  Medicated,  Endo/Other  diabetes, Well Controlled, Type 2, Insulin DependentHyperthyroidism   Renal/GU Renal InsufficiencyRenal disease  negative genitourinary    Musculoskeletal  (+) Arthritis  (RA), Spinal cord stimulators   Abdominal   Peds  Hematology  (+) anemia ,   Anesthesia Other Findings   Reproductive/Obstetrics negative OB ROS                             Anesthesia Physical Anesthesia Plan  ASA: IV  Anesthesia Plan: General   Post-op Pain Management:    Induction:   PONV Risk Score and Plan: TIVA  Airway Management Planned: Nasal Cannula, Natural Airway and Simple Face Mask  Additional Equipment:   Intra-op Plan:   Post-operative Plan:   Informed Consent: I have reviewed the patients History and Physical, chart, labs and discussed the procedure including the risks, benefits and alternatives for the proposed anesthesia with the patient or authorized representative who has indicated his/her understanding and acceptance.     Dental advisory given  Plan Discussed with: CRNA and Surgeon  Anesthesia Plan Comments:         Anesthesia Quick Evaluation

## 2020-04-29 ENCOUNTER — Encounter: Payer: Self-pay | Admitting: Internal Medicine

## 2020-04-29 ENCOUNTER — Other Ambulatory Visit: Payer: Self-pay

## 2020-04-29 LAB — SURGICAL PATHOLOGY

## 2020-04-29 NOTE — Op Note (Signed)
[facility] Patient Name: Katherine Larson Procedure Date: 04/27/2020 7:56 AM MRN: [id number] Date of Birth: 06-17-1946 Attending MD: Norvel Richards , MD CSN: [id number] Age: 74 Admit Type: Outpatient Procedure:                Colonoscopy Indications:              Chronic diarrhea Providers:                Norvel Richards, MD, Janeece Riggers, RN, Randa Spike, Technician Referring MD:              Medicines:                Propofol per Anesthesia Complications:            No immediate complications. Estimated Blood Loss:     Estimated blood loss was minimal. Procedure:                Pre-Anesthesia Assessment:                           - Prior to the procedure, a History and Physical                            was performed, and patient medications and                            allergies were reviewed. The patient's tolerance of                            previous anesthesia was also reviewed. The risks                            and benefits of the procedure and the sedation                            options and risks were discussed with the patient.                            All questions were answered, and informed consent                            was obtained. Prior Anticoagulants: The patient has                            taken no previous anticoagulant or antiplatelet                            agents. ASA Grade Assessment: II - A patient with                            mild systemic disease. After reviewing the risks  and benefits, the patient was deemed in                            satisfactory condition to undergo the procedure.                           After obtaining informed consent, the colonoscope                            was passed under direct vision. Throughout the                            procedure, the patient's blood pressure, pulse, and                            oxygen saturations were  monitored continuously. The                            colonoscopy was performed without difficulty. The                            patient tolerated the procedure well. The quality                            of the bowel preparation was adequate. The                            colonoscopy was performed without difficulty. The                            patient tolerated the procedure well. The quality                            of the bowel preparation was adequate. The                            CF-[id number] ([id number]) scope was introduced through                            the and advanced to the 5 cm into the ileum. Scope In: 8:29:29 AM Scope Out: 8:40:20 AM Scope Withdrawal Time: 0 hours 7 minutes 2 seconds  Total Procedure Duration: 0 hours 10 minutes 51 seconds  Findings:      The perianal and digital rectal examinations were normal.      Non-bleeding internal hemorrhoids were found during retroflexion. The       hemorrhoids were moderate, medium-sized and Grade I (internal       hemorrhoids that do not prolapse).      Many medium-mouthed diverticula were found in the sigmoid colon and       descending colon. Segmental biopsy of the right and left colon taken for       histologic study. Distal 5 cm of terminal ileum appeared normal.      The exam was otherwise without abnormality on direct and retroflexion       views. Impression:               -  Non-bleeding internal hemorrhoids.                           - Diverticulosis in the sigmoid colon and in the                            descending colon. Status post segmental biopsy.                           - The examination was otherwise normal on direct                            and retroflexion views. Normal-appearing terminal                            ileum. Moderate Sedation:      Moderate (conscious) sedation was personally administered by an       anesthesia professional. The following parameters were monitored: oxygen        saturation, heart rate, blood pressure, respiratory rate, EKG, adequacy       of pulmonary ventilation, and response to care. Recommendation:           - Patient has a contact number available for                            emergencies. The signs and symptoms of potential                            delayed complications were discussed with the                            patient. Return to normal activities tomorrow.                            Written discharge instructions were provided to the                            patient.                           - Advance diet as tolerated.                           - Continue present medications.                           - No repeat colonoscopy due to age.                           - Return to GI clinic in 3 months. Procedure Code(s):        --- Professional ---                           [id number], Colonoscopy, flexible; diagnostic, including  collection of specimen(s) by brushing or washing,                            when performed (separate procedure) Diagnosis Code(s):        --- Professional ---                           K64.0, First degree hemorrhoids                           K52.9, Noninfective gastroenteritis and colitis,                            unspecified                           K57.30, Diverticulosis of large intestine without                            perforation or abscess without bleeding CPT copyright 2019 [employer]. All rights reserved. The codes documented in this report are preliminary and upon coder review may  be revised to meet current compliance requirements. Cristopher Estimable. Yusif Gnau, MD Norvel Richards, MD 04/29/2020 3:32:54 PM This report has been signed electronically. Number of Addenda: 0

## 2020-04-30 ENCOUNTER — Encounter (HOSPITAL_COMMUNITY): Payer: Self-pay | Admitting: Internal Medicine

## 2020-05-03 ENCOUNTER — Other Ambulatory Visit: Payer: Self-pay

## 2020-05-03 ENCOUNTER — Encounter: Payer: Self-pay | Admitting: Physician Assistant

## 2020-05-03 ENCOUNTER — Ambulatory Visit (INDEPENDENT_AMBULATORY_CARE_PROVIDER_SITE_OTHER): Payer: Medicare Other | Admitting: Physician Assistant

## 2020-05-03 VITALS — BP 120/60 | HR 85 | Ht 62.5 in | Wt 141.0 lb

## 2020-05-03 DIAGNOSIS — I10 Essential (primary) hypertension: Secondary | ICD-10-CM

## 2020-05-03 DIAGNOSIS — E785 Hyperlipidemia, unspecified: Secondary | ICD-10-CM | POA: Diagnosis not present

## 2020-05-03 DIAGNOSIS — R079 Chest pain, unspecified: Secondary | ICD-10-CM | POA: Diagnosis not present

## 2020-05-03 DIAGNOSIS — I428 Other cardiomyopathies: Secondary | ICD-10-CM

## 2020-05-03 DIAGNOSIS — E119 Type 2 diabetes mellitus without complications: Secondary | ICD-10-CM

## 2020-05-03 MED ORDER — ENTRESTO 49-51 MG PO TABS: 1.0000 | ORAL_TABLET | Freq: Two times a day (BID) | ORAL | 11 refills | Status: DC

## 2020-05-03 NOTE — Patient Instructions (Signed)
Medication Instructions:  Your physician has recommended you make the following change in your medication:  Increase Entresto to 49-51 mg Two times Daily   *If you need a refill on your cardiac medications before your next appointment, please call your pharmacy*   Lab Work: Your physician recommends that you return for lab work in: BMET in 2 Weeks   If you have labs (blood work) drawn today and your tests are completely normal, you will receive your results only by: Marland Kitchen MyChart Message (if you have MyChart) OR . A paper copy in the mail If you have any lab test that is abnormal or we need to change your treatment, we will call you to review the results.   Testing/Procedures: NONE   Follow-Up: At [facility], you and your health needs are our priority.  As part of our continuing mission to provide you with exceptional heart care, we have created designated Provider Care Teams.  These Care Teams include your primary Cardiologist (physician) and Advanced Practice Providers (APPs -  Physician Assistants and Nurse Practitioners) who all work together to provide you with the care you need, when you need it.  We recommend signing up for the patient portal called "MyChart".  Sign up information is provided on this After Visit Summary.  MyChart is used to connect with patients for Virtual Visits (Telemedicine).  Patients are able to view lab/test results, encounter notes, upcoming appointments, etc.  Non-urgent messages can be sent to your provider as well.   To learn more about what you can do with MyChart, go to [email or web address].    Your next appointment:   3 week(s)  The format for your next appointment:   In Person  Provider:   Ermalinda Barrios, PA-C   Other Instructions Thank you for choosing [facility]!

## 2020-05-10 ENCOUNTER — Telehealth: Payer: Self-pay

## 2020-05-10 MED ORDER — BUDESONIDE 3 MG PO CPEP
9.0000 mg | ORAL_CAPSULE | Freq: Every day | ORAL | 1 refills | Status: DC
Start: 2020-05-10 — End: 2020-07-02

## 2020-05-10 NOTE — Telephone Encounter (Signed)
Pt returned call. Pt had her TCS on 04/29/20 and had no diarrhea for 3 days. Watery diarrhea started back on 05/03/20. Pt is having diarrhea 30 mins-2 hours after eating meals. Abdominal pain starts before pt has a BM and goes away after the BM is completed. Pt takes Imodium to help with the diarrhea. Pt received TCS results. 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

## 2020-05-10 NOTE — Telephone Encounter (Signed)
Spoke with pt. Pt is aware that RX was sent to pts pharmacy.

## 2020-05-10 NOTE — Addendum Note (Signed)
Addended by: Claudina Lick on: 05/10/2020 04:09 PM   Modules accepted: Orders

## 2020-05-10 NOTE — Telephone Encounter (Addendum)
Katherine Larson, I spoke with RMR- pt needs entocort 9mg  daily for 6 weeks with 1 rf. Pt also needs ov in 6 weeks. Please let her know. I have sent in rx to [facility] on [address], please schedule ov.

## 2020-05-10 NOTE — Telephone Encounter (Signed)
VM left on 05/07/20. VM retrieved 05/10/20 due to being out of office. Pt would like a call to discuss diarrhea.   Called pt back, waiting on a return call.

## 2020-05-11 ENCOUNTER — Encounter: Payer: Self-pay | Admitting: Internal Medicine

## 2020-05-11 NOTE — Telephone Encounter (Signed)
Noted  

## 2020-05-11 NOTE — Telephone Encounter (Signed)
Patient scheduled and letter sent  °

## 2020-05-18 NOTE — Progress Notes (Signed)
Cardiology Office Note    Date:  05/31/2020   ID:  Katherine Larson, DOB 03-Feb-1946, MRN [id number]  PCP:  Harlan Stains, MD  Cardiologist: Jenkins Rouge, MD EPS: None  Chief Complaint  Patient presents with  . Follow-up    History of Present Illness:  Katherine Larson is a 74 y.o. female with history of CAD status post CABG in 1996 with a LIMA to the LAD and diagonal, graft patent on cath in 2017, nonischemic cardiomyopathy EF 35% 2017 improved to 40 to 45% 2018, NSVT, PAT, LBBB, nerve stimulator for chronic back pain, GERD    Patient saw Dr. [name] Cancel 02/20/2020 with episodes of dyspnea and chest tightness.  NST 03/17/2020 prior anterior MI with moderate peri-infarct ischemia, EF 35% high risk study based on LV dysfunction and moderate peri-infarct ischemia.  2D echo 03/17/2020 moderate LV dysfunction EF 35-40% down from last echo.  Dr. [name] Cancel recommends cardiac catheterization to further evaluate.    Cardiac cath 03/26/20 occluded LAD and patent Cfx. Drop in EF felt to be nonischemic vs microvascular., normal right heart cath, possibly dehydrated.   I saw the patient 04/07/20 and stopped lisinopril and started Entresto.   I saw the patient back 05/03/2020 and she was concerned about being able to pay for the [facility].  She was tolerating it well and we gave her samples and increase it to 49/51 mg twice daily and had her apply for assistance.  Patient comes in for f/u. Has an inner ear infection with some dizziness but doing better on antibiotic. Denies chest pain, shortness of breath, or edema.      Past Medical History:  Diagnosis Date  . Anemia    anemia of chronic disease +/- IDA followed by Dr. Earlie Server. received iron infusions in the past  . Asthma   . Borderline glaucoma   . CAD (coronary artery disease)    a. s/p CABG 1996. b. low risk nuc 2011. c. LHC 04/2016 due to drop in EF -> occluded native LAD,  widely patent sequential LIMA-D1-LAD.  Marland Kitchen Chronic kidney disease    "mild  stage of kidney disease"  . Chronic systolic CHF (congestive heart failure) ([place])   . Complication of anesthesia   . DM2 (diabetes mellitus, type 2) ([place])    not on any medicine for this at this time, 05/2016  . GERD (gastroesophageal reflux disease)   . Gout   . History of hiatal hernia    in 38s  . History of pneumonia    March, 2016  . HLD (hyperlipidemia)   . HTN (hypertension)   . Hyperthyroidism 01/21/2016  . Inappropriate sinus tachycardia   . LBBB (left bundle branch block)    a. Seen in 04/2016  . Macular degeneration    left  . Myocardial infarction ([place])    1996  . Neuropathy   . NICM (nonischemic cardiomyopathy) ([place])    a. Dx 04/2016 - EF 25-30%, diffuse HK, elevated LVEDP, mild MR, mod LAE.  Marland Kitchen NSVT (nonsustained ventricular tachycardia) ([place])   . PAT (paroxysmal atrial tachycardia) ([place])   . PONV (postoperative nausea and vomiting)    "after just about every surgery I've had"  . Rheumatoid arthritis (HCC)    RA  . Seasonal allergies     Past Surgical History:  Procedure Laterality Date  . ABDOMINAL HYSTERECTOMY    . APPENDECTOMY    . BACK SURGERY    . BIOPSY  04/27/2020   Procedure: BIOPSY;  Surgeon: Manus Rudd  M, MD;  [facility];  Service: Endoscopy;;  ascending colon biopsy  . CARDIAC CATHETERIZATION N/A 05/01/2016   Procedure: Right/Left Heart Cath and Coronary/Graft Angiography;  Surgeon: Leonie Man, MD;  Location: [place] [facility];  Service: Cardiovascular;  Laterality: N/A;  . CHOLECYSTECTOMY    . COLONOSCOPY  11/2011   Dr. Magod:ext/int hemorrhoids, diverticulosis sigmoid colon and distal desc colon, mid-desc colon hyperplastic polyps.   . COLONOSCOPY WITH PROPOFOL N/A 04/27/2020   Procedure: COLONOSCOPY WITH PROPOFOL;  Surgeon: Daneil Dolin, MD;  [facility];  Service: Endoscopy;  Laterality: N/A;  8:30am  . CORONARY ARTERY BYPASS GRAFT  1996   2 vessels  . ESOPHAGOGASTRODUODENOSCOPY  11/2011   Dr. Watt Climes: small hiatal  hernia, one non-bleeding superficial gastric ulcer, medium-sized diverticulum in area of papilla  . ESOPHAGOGASTRODUODENOSCOPY N/A 12/29/2015   Dr. Gala Romney: Chronic inactive gastritis, normal esophagus status post dilation, duodenal diverticula  . ESOPHAGOGASTRODUODENOSCOPY (EGD) WITH PROPOFOL N/A 07/24/2019   Dr. Gala Romney: Normal esophagus status post empiric dilation, small hiatal hernia, large D2 diverticulum  . EYE SURGERY Bilateral    cataract removal  . FOOT SURGERY     removal bone spur  . FRACTURE SURGERY Right    arm  . GIVENS CAPSULE STUDY  11/2012   Dr. Watt Climes: minimal gastritis, normal small bowel capsule  . HERNIA REPAIR     hiatal hernia  . MALONEY DILATION N/A 07/24/2019   Procedure: Venia Minks DILATION;  Surgeon: Daneil Dolin, MD;  [facility];  Service: Endoscopy;  Laterality: N/A;  . MAXIMUM ACCESS (MAS)POSTERIOR LUMBAR INTERBODY FUSION (PLIF) 1 LEVEL N/A 08/14/2013   Procedure:  MAXIMUM ACCESS SURGERY(MAS) POSTERIOR LUMBAR INTERBODY FUSION LUMBAR THREE-FOUR ;  Surgeon: Eustace Moore, MD;  Location: [place] [facility];  Service: Neurosurgery;  Laterality: N/A;   MAXIMUM ACCESS SURGERY(MAS) POSTERIOR LUMBAR INTERBODY FUSION LUMBAR THREE-FOUR   . PTCA    . RIGHT/LEFT HEART CATH AND CORONARY ANGIOGRAPHY N/A 03/26/2020   Procedure: RIGHT/LEFT HEART CATH AND CORONARY ANGIOGRAPHY;  Surgeon: Leonie Man, MD;  Location: [place] [facility];  Service: Cardiovascular;  Laterality: N/A;  . SPINAL CORD STIMULATOR INSERTION N/A 06/16/2016   Procedure: LUMBAR SPINAL CORD STIMULATOR INSERTION;  Surgeon: Clydell Hakim, MD;  Location: [place] [facility];  Service: Neurosurgery;  Laterality: N/A;  LUMBAR SPINAL CORD STIMULATOR INSERTION  . tens  05/2016  . TONSILLECTOMY      Current Medications: Current Meds  Medication Sig  . albuterol (PROVENTIL HFA;VENTOLIN HFA) 108 (90 Base) MCG/ACT inhaler Take 2 puffs 3 times a day and every 4 hours as needed.  Marland Kitchen allopurinol (ZYLOPRIM) 300 MG tablet Take 300  mg by mouth daily.  Marland Kitchen aspirin EC 81 MG EC tablet Take 1 tablet (81 mg total) by mouth daily.  Marland Kitchen atorvastatin (LIPITOR) 80 MG tablet Take 80 mg by mouth daily.  . budesonide (ENTOCORT EC) 3 MG 24 hr capsule Take 3 capsules (9 mg total) by mouth daily.  . calcium carbonate (TUMS - DOSED IN MG ELEMENTAL CALCIUM) 500 MG chewable tablet Chew 1 tablet by mouth as needed for indigestion or heartburn.  . carvedilol (COREG) 12.5 MG tablet TAKE 1 TABLET BY MOUTH TWO  TIMES DAILY  . cholecalciferol (VITAMIN D3) 25 MCG (1000 UT) tablet Take 1,000 Units by mouth daily.  . fexofenadine (ALLEGRA) 180 MG tablet Take 180 mg by mouth daily.    . fluticasone (FLONASE) 50 MCG/ACT nasal spray Place 2 sprays into both nostrils  daily as needed for allergies.   . furosemide (LASIX) 40 MG tablet Take 40 mg by mouth 2 (two) times daily.   Marland Kitchen gabapentin (NEURONTIN) 300 MG capsule Take 300 mg by mouth 3 (three) times daily.   Marland Kitchen HYDROcodone-acetaminophen (NORCO) 7.5-325 MG tablet Take 1 tablet by mouth every 6 (six) hours.   . iron polysaccharides (NIFEREX) 150 MG capsule Take 150 mg by mouth daily.  . Lancets (ONETOUCH DELICA PLUS [id number]) [place]   . leflunomide (ARAVA) 20 MG tablet Take 20 mg by mouth daily.  Marland Kitchen loperamide (IMODIUM A-D) 2 MG tablet Take 2 mg by mouth 4 (four) times daily as needed for diarrhea or loose stools.  . magnesium oxide (MAGNESIUM-OXIDE) 400 (241.3 Mg) MG tablet Take 1 tablet (400 mg total) by mouth daily.  . meclizine (ANTIVERT) 12.5 MG tablet Take 12.5 mg by mouth 3 (three) times daily as needed for dizziness.  . metFORMIN (GLUCOPHAGE) 500 MG tablet Take 500 mg by mouth in the morning and at bedtime.   . montelukast (SINGULAIR) 10 MG tablet Take 10 mg by mouth at bedtime.  . Multiple Vitamins-Minerals (OCUVITE ADULT 50+ PO) Take 1 tablet by mouth daily.  . nitroGLYCERIN (NITROSTAT) 0.4 MG SL tablet Place 1 tablet (0.4 mg total) under the tongue every 5 (five) minutes as needed for chest pain.  Reported on 03/24/2016  . pantoprazole (PROTONIX) 40 MG tablet Take 1 tablet (40 mg total) by mouth 2 (two) times daily before a meal.  . Polyethyl Glycol-Propyl Glycol (SYSTANE OP) Apply 1 drop to eye 3 (three) times daily as needed ([place]).  . potassium chloride SA (K-DUR,KLOR-CON) 20 MEQ tablet Take 1 tablet (20 mEq total) by mouth 2 (two) times daily.  . sacubitril-valsartan (ENTRESTO) 49-51 MG Take 1 tablet by mouth 2 (two) times daily.  Marland Kitchen triamcinolone cream (KENALOG) 0.1 % Apply 1 application topically daily as needed (for irritation).    Current Facility-Administered Medications for the 05/31/20 encounter (Office Visit) with Imogene Burn, PA-C  Medication  . sodium chloride flush (NS) 0.9 % injection 3 mL     Allergies:   Biaxin [clarithromycin] and Penicillins   Social History   Socioeconomic History  . Marital status: Married    Spouse name: Not on file  . Number of children: 3  . Years of education: Not on file  . Highest education level: Not on file  Occupational History  . Not on file  Tobacco Use  . Smoking status: Former Smoker    Packs/day: 0.75    Years: 15.00    Pack years: 11.25    Types: Cigarettes    Quit date: 11/20/1994    Years since quitting: 25.5  . Smokeless tobacco: Never Used  . Tobacco comment: Quit in 1996  Vaping Use  . Vaping Use: Never used  Substance and Sexual Activity  . Alcohol use: No    Alcohol/week: 0.0 standard drinks  . Drug use: No  . Sexual activity: Yes  Other Topics Concern  . Not on file  Social History Narrative   2 living children, one deceased age 65   Social Determinants of Health   Financial Resource Strain:   . Difficulty of Paying Living Expenses:   Food Insecurity:   . Worried About Charity fundraiser in the Last Year:   . Arboriculturist in the Last Year:   Transportation Needs:   . Film/video editor (Medical):   Marland Kitchen Lack of Transportation (Non-Medical):  Physical Activity:   . Days of Exercise per  Week:   . Minutes of Exercise per Session:   Stress:   . Feeling of Stress :   Social Connections:   . Frequency of Communication with Friends and Family:   . Frequency of Social Gatherings with Friends and Family:   . Attends Religious Services:   . Active Member of Clubs or Organizations:   . Attends Archivist Meetings:   Marland Kitchen Marital Status:      Family History:  The patient's family history includes Bladder Cancer in her brother; Cervical cancer in her daughter; Heart attack in her father and mother.   ROS:   Please see the history of present illness.    ROS All other systems reviewed and are negative.   PHYSICAL EXAM:   VS:  BP 116/62   Pulse 88   Ht 5' 2.5" (1.588 m)   Wt 139 lb (63 kg)   SpO2 96%   BMI 25.02 kg/m   Physical Exam  GEN: Well nourished, well developed, in no acute distress  Neck: no JVD, carotid bruits, or masses Cardiac:RRR; no murmurs, rubs, or gallops  Respiratory:  clear to auscultation bilaterally, normal work of breathing GI: soft, nontender, nondistended, + BS Ext: without cyanosis, clubbing, or edema, Good distal pulses bilaterally Neuro:  Alert and Oriented x 3 Psych: euthymic mood, full affect  Wt Readings from Last 3 Encounters:  05/31/20 139 lb (63 kg)  05/03/20 141 lb (64 kg)  04/27/20 136 lb (61.7 kg)      Studies/Labs Reviewed:   EKG:  EKG is not ordered today.   Recent Labs: 03/23/2020: Platelets 225 03/26/2020: Hemoglobin 10.2; Hemoglobin 10.2 05/26/2020: BUN 20; Creatinine, Ser 0.88; Potassium 3.8; Sodium 137   Lipid Panel    Component Value Date/Time   CHOL 112 12/19/2007 0910   TRIG 177 (H) 12/19/2007 0910   HDL 30.2 (L) 12/19/2007 0910   CHOLHDL 3.7 CALC 12/19/2007 0910   VLDL 35 12/19/2007 0910   LDLCALC 46 12/19/2007 0910    Additional studies/ records that were reviewed today include:  Cardiac cath 03/26/20    LV END DIASTOLIC PRESSURE and PCWP is normal.  There is no aortic valve stenosis.  Dist LM  to Mid LAD lesion is 70% stenosed with 30% stenosed side branch in 1st Sept -> leading up to the stent  Previously placed bare-metal stent in the mid LAD is 100% stenosed.  LIMA-LAD graft was visualized by angiography and is normal in caliber. The graft exhibits no disease. The flow is not reversed. There is no competitive flow   SUMMARY  Severe single-vessel disease with occluded LAD prior to bifurcation into the diagonal branch.  Otherwise widely patent native LCx, RCA and diagonal-LAD that are grafted. ? I suspect that the drop in EF is probably related to a nonischemic etiology or for microvascular ischemia.   ? Nuclear stress test does show mild basal anterior and anteroseptal reversibility in the setting of a large anteroseptal infarct.  I suspect that this is probably due to reduced flow at the site of the occluded LAD stent with minimal upstream septal perforators.  Normal RIGHT HEART CATH pressures with low LVEDP and PCWP indicating mild dehydration -> indicates adequate diuresis  Relatively normal cardiac output and index of 5.41 and 3.24 respectively.     The patient will be discharged home to follow-up with primary cardiologist.     Glenetta Hew, MD   2D echo 03/17/2020  IMPRESSIONS     1. Left ventricular ejection fraction, by estimation, is 35 to 40%. The  left ventricle has normal function. The left ventricle demonstrates global  hypokinesis. Left ventricular diastolic parameters are indeterminate.   2. Right ventricular systolic function is mildly reduced. The right  ventricular size is normal. There is normal pulmonary artery systolic  pressure.   3. The mitral valve is normal in structure. No evidence of mitral valve  regurgitation. No evidence of mitral stenosis.   4. The aortic valve is tricuspid. Aortic valve regurgitation is mild. No  aortic stenosis is present.   5. The inferior vena cava is normal in size with greater than 50%  respiratory variability,  suggesting right atrial pressure of 3 mmHg.   FINDINGS   Left Ventricle: Left ventricular ejection fraction, by estimation, is 35  to 40%. The left ventricle has normal function. The left ventricle  demonstrates global hypokinesis. The left ventricular internal cavity size  was normal in size. There is no left  ventricular hypertrophy. Left ventricular diastolic parameters are  indeterminate.   Right Ventricle: The right ventricular size is normal. No increase in  right ventricular wall thickness. Right ventricular systolic function is  mildly reduced. There is normal pulmonary artery systolic pressure. The  tricuspid regurgitant velocity is 2.20  m/s, and with an assumed right atrial pressure of 3 mmHg, the estimated  right ventricular systolic pressure is 60.6 mmHg.   Left Atrium: Left atrial size was normal in size.   Right Atrium: Right atrial size was normal in size.   Pericardium: There is no evidence of pericardial effusion.   Mitral Valve: The mitral valve is normal in structure. No evidence of  mitral valve regurgitation. No evidence of mitral valve stenosis.   Tricuspid Valve: The tricuspid valve is normal in structure. Tricuspid  valve regurgitation is mild . No evidence of tricuspid stenosis.   Aortic Valve: The aortic valve is tricuspid. Aortic valve regurgitation is  mild. No aortic stenosis is present. Aortic valve mean gradient measures  3.4 mmHg. Aortic valve peak gradient measures 7.4 mmHg. Aortic valve area,  by VTI measures 2.36 cm.   Pulmonic Valve: The pulmonic valve was not well visualized. Pulmonic valve  regurgitation is not visualized. No evidence of pulmonic stenosis.   Aorta: The aortic root is normal in size and structure.   Pulmonary Artery: No evidence of pulmonary HTN, PASP is 22 mmHg.   Venous: The inferior vena cava is normal in size with greater than 50%  respiratory variability, suggesting right atrial pressure of 3 mmHg.   IAS/Shunts:  No atrial level shunt detected by color flow Doppler.      NST 4/28/2021There was no ST segment deviation noted during stress.  Findings consistent with prior anterior myocardial infarction with moderate peri-infarct ischemia.  This is a high risk study. Risk based on combined LV dysfunction and moderate peri-infarct ischemia.  The left ventricular ejection fraction is moderately decreased (35%).     Cardiac catheterization 2017 1. Mid LAD lesion, 100% stenosed. The lesion was previously treated with a bare metal stent greater than two years ago. 2. Sequential LIMA-D1-LAD was injected is large, and is anatomically normal. Anastomosis is distal to occluded LAD stent 3. Essentially normal right heart cath pressures with normal LVEDP     Based on the evidence of widely patent LIMA graft and native arteries nongrafted, the sudden drop in EF is likely nonischemic in nature. She appears to be relatively euvolemic and adequately  diuresed.   Plan:  Sheath removal in Cath Lab with manual pressure for hemostasis.  Return to nursing unit for continued management by primary service.  Expect that she should be ready for discharge soon, pending reevaluation by primary team.             ASSESSMENT:    1. Other chest pain   2. NICM (nonischemic cardiomyopathy) ([place])   3. Essential hypertension   4. Hyperlipidemia, unspecified hyperlipidemia type   5. Type 2 diabetes mellitus without complication, without long-term current use of insulin (HCC)      PLAN:  In order of problems listed above:  Chest pain and dyspnea on exertion with Abnormal NST with prior anterior MI with moderate peri-infarct ischemia EF 35% high risk study based on combined LV dysfunction and moderate peri-infarct ischemia.  Right and left Cardiac catheterization 03/26/20 drop in EF  Nonischemic vs microvascular. Medical therapy recommended.  No recent chest pain or dyspnea   Cardiomyopathy felt to be either  nonischemic or microvascular LVEF 35%(prior NICM 2017 EF 35% improved 45-50% echo 2018)-on   Entresto  49/51 mg bid. No CHF on exam. Will continue current meds. Repeat echo in 3 months with f/u after. BMET today   HTN controlled and stable    HLD managed by PCP on atorvastatin   DM2 managed by PCP              Medication Adjustments/Labs and Tests Ordered: Current medicines are reviewed at length with the patient today.  Concerns regarding medicines are outlined above.  Medication changes, Labs and Tests ordered today are listed in the Patient Instructions below. There are no Patient Instructions on file for this visit.   Sumner Boast, PA-C  05/31/2020 11:28 AM    [facility] [address], [place], [place]  [address] Phone: [phone]; Fax: [phone]

## 2020-05-26 ENCOUNTER — Other Ambulatory Visit (HOSPITAL_COMMUNITY)
Admission: RE | Admit: 2020-05-26 | Discharge: 2020-05-26 | Disposition: A | Discharge status: Home / Self Care | Payer: Medicare Other | Source: Ambulatory Visit | Attending: Physician Assistant | Admitting: Physician Assistant

## 2020-05-26 ENCOUNTER — Other Ambulatory Visit: Payer: Self-pay

## 2020-05-26 DIAGNOSIS — I10 Essential (primary) hypertension: Secondary | ICD-10-CM | POA: Insufficient documentation

## 2020-05-26 LAB — BASIC METABOLIC PANEL
Anion gap: 10 (ref 5–15)
BUN: 20 mg/dL (ref 8–23)
CO2: 27 mmol/L (ref 22–32)
Calcium: 8.8 mg/dL — ABNORMAL LOW (ref 8.9–10.3)
Chloride: 100 mmol/L (ref 98–111)
Creatinine, Ser: 0.88 mg/dL (ref 0.44–1.00)
GFR calc Af Amer: >60 mL/min (ref >60)
GFR calc non Af Amer: >60 mL/min (ref >60)
Glucose, Bld: 152 mg/dL — ABNORMAL HIGH (ref 70–99)
Potassium: 3.8 mmol/L (ref 3.5–5.1)
Sodium: 137 mmol/L (ref 135–145)

## 2020-05-31 ENCOUNTER — Other Ambulatory Visit (HOSPITAL_COMMUNITY)
Admission: RE | Admit: 2020-05-31 | Discharge: 2020-05-31 | Disposition: A | Discharge status: Home / Self Care | Payer: Medicare Other | Source: Ambulatory Visit | Attending: Physician Assistant | Admitting: Physician Assistant

## 2020-05-31 ENCOUNTER — Encounter: Payer: Self-pay | Admitting: Physician Assistant

## 2020-05-31 ENCOUNTER — Other Ambulatory Visit: Payer: Self-pay

## 2020-05-31 ENCOUNTER — Ambulatory Visit: Payer: Medicare Other | Admitting: Physician Assistant

## 2020-05-31 VITALS — BP 116/62 | HR 88 | Ht 62.5 in | Wt 139.0 lb

## 2020-05-31 DIAGNOSIS — R0789 Other chest pain: Secondary | ICD-10-CM

## 2020-05-31 DIAGNOSIS — I428 Other cardiomyopathies: Secondary | ICD-10-CM | POA: Insufficient documentation

## 2020-05-31 DIAGNOSIS — I10 Essential (primary) hypertension: Secondary | ICD-10-CM | POA: Diagnosis present

## 2020-05-31 DIAGNOSIS — E785 Hyperlipidemia, unspecified: Secondary | ICD-10-CM | POA: Diagnosis not present

## 2020-05-31 DIAGNOSIS — E119 Type 2 diabetes mellitus without complications: Secondary | ICD-10-CM

## 2020-05-31 LAB — BASIC METABOLIC PANEL
Anion gap: 15 (ref 5–15)
BUN: 25 mg/dL — ABNORMAL HIGH (ref 8–23)
CO2: 25 mmol/L (ref 22–32)
Calcium: 9.4 mg/dL (ref 8.9–10.3)
Chloride: 98 mmol/L (ref 98–111)
Creatinine, Ser: 0.83 mg/dL (ref 0.44–1.00)
GFR calc Af Amer: >60 mL/min (ref >60)
GFR calc non Af Amer: >60 mL/min (ref >60)
Glucose, Bld: 122 mg/dL — ABNORMAL HIGH (ref 70–99)
Potassium: 3.8 mmol/L (ref 3.5–5.1)
Sodium: 138 mmol/L (ref 135–145)

## 2020-05-31 NOTE — Addendum Note (Signed)
Addended by: Levonne Hubert on: 05/31/2020 11:40 AM   Modules accepted: Orders

## 2020-05-31 NOTE — Patient Instructions (Signed)
Medication Instructions:  Your physician recommends that you continue on your current medications as directed. Please refer to the Current Medication list given to you today.  *If you need a refill on your cardiac medications before your next appointment, please call your pharmacy*   Lab Work: Your physician recommends that you return for lab work in: Today   If you have labs (blood work) drawn today and your tests are completely normal, you will receive your results only by: . MyChart Message (if you have MyChart) OR . A paper copy in the mail If you have any lab test that is abnormal or we need to change your treatment, we will call you to review the results.   Testing/Procedures: Your physician has requested that you have an echocardiogram. Echocardiography is a painless test that uses sound waves to create images of your heart. It provides your doctor with information about the size and shape of your heart and how well your heart's chambers and valves are working. This procedure takes approximately one hour. There are no restrictions for this procedure.   Follow-Up: At [facility], you and your health needs are our priority.  As part of our continuing mission to provide you with exceptional heart care, we have created designated Provider Care Teams.  These Care Teams include your primary Cardiologist (physician) and Advanced Practice Providers (APPs -  Physician Assistants and Nurse Practitioners) who all work together to provide you with the care you need, when you need it.  We recommend signing up for the patient portal called "MyChart".  Sign up information is provided on this After Visit Summary.  MyChart is used to connect with patients for Virtual Visits (Telemedicine).  Patients are able to view lab/test results, encounter notes, upcoming appointments, etc.  Non-urgent messages can be sent to your provider as well.   To learn more about what you can do with MyChart, go to  [email or web address].    Your next appointment:   3 month(s)  The format for your next appointment:   In Person  Provider:   Peter Nishan, MD   Other Instructions Thank you for choosing [facility]!    

## 2020-06-06 ENCOUNTER — Other Ambulatory Visit: Payer: Self-pay | Admitting: Gastroenterology

## 2020-06-06 ENCOUNTER — Other Ambulatory Visit: Payer: Self-pay | Admitting: Cardiovascular Disease

## 2020-07-02 ENCOUNTER — Other Ambulatory Visit: Payer: Self-pay

## 2020-07-02 ENCOUNTER — Ambulatory Visit: Payer: Medicare Other | Admitting: Gastroenterology

## 2020-07-02 ENCOUNTER — Encounter: Payer: Self-pay | Admitting: Gastroenterology

## 2020-07-02 VITALS — BP 120/65 | HR 88 | Temp 96.2°F | Ht 63.0 in | Wt 141.2 lb

## 2020-07-02 DIAGNOSIS — R1319 Other dysphagia: Secondary | ICD-10-CM

## 2020-07-02 DIAGNOSIS — K52832 Lymphocytic colitis: Secondary | ICD-10-CM | POA: Diagnosis not present

## 2020-07-02 DIAGNOSIS — R1310 Dysphagia, unspecified: Secondary | ICD-10-CM | POA: Diagnosis not present

## 2020-07-02 DIAGNOSIS — D649 Anemia, unspecified: Secondary | ICD-10-CM

## 2020-07-02 DIAGNOSIS — K219 Gastro-esophageal reflux disease without esophagitis: Secondary | ICD-10-CM

## 2020-07-02 NOTE — Patient Instructions (Addendum)
Lymphocytic colitis: Try taking Entocort 3 mg daily for the next 30 days.  If you develop constipation, please let us know.  Ideally would like you to take 3 mg daily for at least 30 days, if diarrhea has resolved then you can stop the medication at that point.  If diarrhea persist, then would extend for an additional 30 days.  Limit Imodium for occasional use only especially if you are developing constipation.  Medications such as pantoprazole can be linked with lymphocytic colitis.  Try weaning off of pantoprazole if tolerated.  Start with backing down to once daily for 1 week.  If you tolerate this, then stop altogether.  In the place of pantoprazole, you can take Pepcid 20 mg twice daily.  Anemia: Please have your lab work done to follow-up on anemia.  We will contact you with results as available.  Swallowing concerns: If you desire further evaluation of difficulty swallowing (pills and food becoming stuck), next step would be esophageal manometry testing in [place].  This test involves placing a catheter through the nose into the esophagus, you will perform several swallows which will be able to measure pressures to determine if you have a motility disorder.  Return to the office in 4 months, call sooner if needed.

## 2020-07-02 NOTE — Progress Notes (Signed)
Primary Care Physician: Harlan Stains, MD  Primary Gastroenterologist:  Garfield Cornea, MD   Chief Complaint  Patient presents with  . Dysphagia    occ with meat    HPI: Katherine Larson is a 74 y.o. female here for follow-up.  She was seen in the office back in April with history of dysphagia, GERD, diarrhea.  Dysphagia previously evaluated with EGD, esophagus dilated but she had persistent symptoms.  Barium pill esophagram November 2020 with moderate esophageal dysmotility with prolonged thoracic retention the contrast, laryngeal penetration to the level of vocal cords, prominent GERD.  She was offered modified barium swallow study plus or minus esophageal manometry but was not interested at the time due to other pending medical issues.  Complaints of chronic diarrhea with previous negative random colon biopsies in 2013, negative celiac screen.  Since her last office visit she completed a colonoscopy that showed diverticulosis, hemorrhoids, normal terminal ileum.  Random colon biopsies significant for chronic lymphocytic colitis.  She was started on Entocort 9 mg daily.  Patient states she was on Entocort 9mg  for one week and then got constipated. No BM for two weeks. Dropped back to 6mg  daily. Did ok for awhile but continues with constipation so she stopped completely about one month ago. Now having BM about twice per day, no longer watery but still liquid. Stools are very dark and foul order. Some of urgency she has she feels is nerves related. Takes imodium the night before plans to go somewhere. Will skip BM for couple of days. Doesn't have to use often, just doesn't go anywhere much anymore due to Covid.   Heartburn has been ok. She is on pantoprazole BID. Occasionally uses TUMS. If she runs out of pantoprazole she has used pepcid. Recently her potassium got hung in her throat. Tasted the pill and coughed the entire day before she got relief. Still avoids meat due to dysphagia. No  n/v. No abdominal pain. No brbpr. No unintentional weight loss.     Current Outpatient Medications  Medication Sig Dispense Refill  . albuterol (PROVENTIL HFA;VENTOLIN HFA) 108 (90 Base) MCG/ACT inhaler Take 2 puffs 3 times a day and every 4 hours as needed.    Marland Kitchen allopurinol (ZYLOPRIM) 300 MG tablet Take 300 mg by mouth daily.    Marland Kitchen aspirin EC 81 MG EC tablet Take 1 tablet (81 mg total) by mouth daily.    Marland Kitchen atorvastatin (LIPITOR) 80 MG tablet Take 80 mg by mouth daily.    . budesonide (ENTOCORT EC) 3 MG 24 hr capsule Take 3 capsules (9 mg total) by mouth daily. (Patient taking differently: Take 6 mg by mouth daily. ) 90 capsule 1  . calcium carbonate (TUMS - DOSED IN MG ELEMENTAL CALCIUM) 500 MG chewable tablet Chew 1 tablet by mouth as needed for indigestion or heartburn.    . carvedilol (COREG) 12.5 MG tablet TAKE 1 TABLET BY MOUTH TWO  TIMES DAILY 180 tablet 3  . cholecalciferol (VITAMIN D3) 25 MCG (1000 UT) tablet Take 1,000 Units by mouth daily.    . fexofenadine (ALLEGRA) 180 MG tablet Take 180 mg by mouth daily.      . fluticasone (FLONASE) 50 MCG/ACT nasal spray Place 2 sprays into both nostrils daily as needed for allergies.     . furosemide (LASIX) 40 MG tablet Take 40 mg by mouth 2 (two) times daily.     Marland Kitchen gabapentin (NEURONTIN) 300 MG capsule Take 300 mg by mouth 3 (three)  times daily.     Marland Kitchen HYDROcodone-acetaminophen (NORCO) 7.5-325 MG tablet Take 1 tablet by mouth every 6 (six) hours.     . iron polysaccharides (NIFEREX) 150 MG capsule Take 150 mg by mouth daily.    . Lancets (ONETOUCH DELICA PLUS [id number]) [place]     . leflunomide (ARAVA) 20 MG tablet Take 20 mg by mouth daily.    Marland Kitchen loperamide (IMODIUM A-D) 2 MG tablet Take 2 mg by mouth 4 (four) times daily as needed for diarrhea or loose stools.    Marland Kitchen MAGNESIUM-OXIDE 400 (241.3 Mg) MG tablet TAKE 1 TABLET(400 MG) BY MOUTH DAILY 30 tablet 6  . meclizine (ANTIVERT) 12.5 MG tablet Take 12.5 mg by mouth 3 (three) times daily as needed  for dizziness.    . metFORMIN (GLUCOPHAGE) 500 MG tablet Take 500 mg by mouth in the morning and at bedtime.     . montelukast (SINGULAIR) 10 MG tablet Take 10 mg by mouth at bedtime.    . Multiple Vitamins-Minerals (OCUVITE ADULT 50+ PO) Take 1 tablet by mouth daily.    . nitroGLYCERIN (NITROSTAT) 0.4 MG SL tablet Place 1 tablet (0.4 mg total) under the tongue every 5 (five) minutes as needed for chest pain. Reported on 03/24/2016 25 tablet prn  . pantoprazole (PROTONIX) 40 MG tablet TAKE 1 TABLET BY MOUTH  TWICE DAILY BEFORE A MEAL 180 tablet 3  . Polyethyl Glycol-Propyl Glycol (SYSTANE OP) Apply 1 drop to eye 3 (three) times daily as needed (Dry Eyes).    . potassium chloride SA (K-DUR,KLOR-CON) 20 MEQ tablet Take 1 tablet (20 mEq total) by mouth 2 (two) times daily. 40 tablet 11  . sacubitril-valsartan (ENTRESTO) 49-51 MG Take 1 tablet by mouth 2 (two) times daily. 60 tablet 11  . triamcinolone cream (KENALOG) 0.1 % Apply 1 application topically daily as needed (for irritation).      Current Facility-Administered Medications  Medication Dose Route Frequency Provider Last Rate Last Admin  . sodium chloride flush (NS) 0.9 % injection 3 mL  3 mL Intravenous Q12H Imogene Burn, PA-C        Allergies as of 07/02/2020 - Review Complete 07/02/2020  Allergen Reaction Noted  . Biaxin [clarithromycin] Rash and Other (See Comments) 02/02/2010  . Penicillins Rash and Other (See Comments) 02/02/2010    ROS:  General: Negative for anorexia, weight loss, fever, chills, fatigue, weakness. ENT: Negative for hoarseness, nasal congestion. See hpi CV: Negative for chest pain, angina, palpitations, dyspnea on exertion, peripheral edema.  Respiratory: Negative for dyspnea at rest, dyspnea on exertion, cough, sputum, wheezing.  GI: See history of present illness. GU:  Negative for dysuria, hematuria, urinary incontinence, urinary frequency, nocturnal urination.  Endo: Negative for unusual weight change.      Physical Examination:   BP 120/65   Pulse 88   Temp (!) 96.2 F (35.7 C) (Temporal)   Ht 5\' 3"  (1.6 m)   Wt 141 lb 3.2 oz (64 kg)   BMI 25.01 kg/m   General: Well-nourished, well-developed in no acute distress.  Eyes: No icterus. Mouth: masked Abdomen: Bowel sounds are normal, nontender, nondistended, no hepatosplenomegaly or masses, no abdominal bruits or hernia , no rebound or guarding.   Extremities: No lower extremity edema. No clubbing or deformities. Neuro: Alert and oriented x 4   Skin: Warm and dry, no jaundice.   Psych: Alert and cooperative, normal mood and affect.  Labs:  Lab Results  Component Value Date   CREATININE 0.83 05/31/2020  BUN 25 (H) 05/31/2020   NA 138 05/31/2020   K 3.8 05/31/2020   CL 98 05/31/2020   CO2 25 05/31/2020   Lab Results  Component Value Date   WBC 12.8 (H) 03/23/2020   HGB 10.2 (L) 03/26/2020   HGB 10.2 (L) 03/26/2020   HCT 30.0 (L) 03/26/2020   HCT 30.0 (L) 03/26/2020   MCV 99.4 03/23/2020   PLT 225 03/23/2020      Imaging Studies: No results found.  Impression/plan:  Esophageal dysphagia: Predominantly to meet in large pills.  Previous esophageal dilation as outlined above.  BPE last year with moderate esophageal dysmotility with prolonged thoracic retention of contrast, laryngeal penetration to the level of vocal cords, prominent GERD.  She has been offered further work-up including modified barium swallow study as well as esophageal manometry.  We will continue to monitor for now and if she desires work-up she will let us know.  GERD: Typical symptoms well controlled.  With recent diagnosis of lymphocytic colitis, consider transitioning from PPI to H2 blockers as PPIs have been associated with microscopic colitis.  I am doubtful that she will be able to tolerated the patient desires trying.  Initially taper down to pantoprazole once daily for 1 week, then try to stop completely.  May transition to Pepcid 20 mg twice  daily.  Lymphocytic colitis: Noted on recent colonoscopy.  She became significantly constipated on Entocort 9 mg daily.  She reports constipation persisted with 6 mg daily.  She has been off Entocort for 30 days.  She is having to liquid stools daily and takes Imodium if she has to go out.  Plan to try 3 mg daily for 30 days.  If tolerated and diarrhea resolves, she can consider stopping.  If tolerated and diarrhea persist, would continue 30 more days for total of 60 days on 3 mg daily.  Use Imodium for occasional use only.  Return to the office in 4 months.  Anemia: Due for repeat labs at this time.  Patient reports dark stools but not necessarily black stools.  Endoscopy and colonoscopy up-to-date.  Further recommendations to follow.

## 2020-07-20 ENCOUNTER — Other Ambulatory Visit: Payer: Self-pay | Admitting: Family Medicine

## 2020-07-20 DIAGNOSIS — Z1231 Encounter for screening mammogram for malignant neoplasm of breast: Secondary | ICD-10-CM

## 2020-07-30 ENCOUNTER — Other Ambulatory Visit: Payer: Self-pay

## 2020-07-30 ENCOUNTER — Ambulatory Visit
Admission: RE | Admit: 2020-07-30 | Discharge: 2020-07-30 | Disposition: A | Discharge status: Home / Self Care | Payer: Medicare Other | Source: Ambulatory Visit | Attending: Family Medicine | Admitting: Family Medicine

## 2020-07-30 DIAGNOSIS — Z1231 Encounter for screening mammogram for malignant neoplasm of breast: Secondary | ICD-10-CM

## 2020-08-31 ENCOUNTER — Ambulatory Visit (HOSPITAL_COMMUNITY): Payer: Medicare Other

## 2020-09-08 ENCOUNTER — Telehealth: Payer: Self-pay

## 2020-09-08 NOTE — Telephone Encounter (Signed)
Pt returned call. Pt isn't stopping her Pantoprazole. Pt's insurance company tried to get her to d/c medication. Pt continued to have symptoms after coming off of Pantoprazole and went back on it. Will disregard letter from pts insurance company at this time.

## 2020-09-08 NOTE — Telephone Encounter (Signed)
Katherine Larson, waiting on a return call. Received a letter from [employer] stating that pt is asking to d/c med. Will discuss further with pt when she calls. At last ov, pt could d/c med if her symptoms were improved or try Pepcid.

## 2020-09-21 ENCOUNTER — Other Ambulatory Visit: Payer: Self-pay

## 2020-09-21 ENCOUNTER — Ambulatory Visit (HOSPITAL_COMMUNITY)
Admission: RE | Admit: 2020-09-21 | Discharge: 2020-09-21 | Disposition: A | Discharge status: Home / Self Care | Payer: Medicare Other | Source: Ambulatory Visit | Attending: Physician Assistant | Admitting: Physician Assistant

## 2020-09-21 DIAGNOSIS — I428 Other cardiomyopathies: Secondary | ICD-10-CM

## 2020-09-21 DIAGNOSIS — I10 Essential (primary) hypertension: Secondary | ICD-10-CM | POA: Diagnosis not present

## 2020-09-21 LAB — ECHOCARDIOGRAM COMPLETE
AR max vel: 1.82 cm2
AV Area VTI: 1.92 cm2
AV Area mean vel: 1.86 cm2
AV Mean grad: 4.3 mmHg
AV Peak grad: 8.4 mmHg
Ao pk vel: 1.45 m/s
Area-P 1/2: 3.39 cm2
Calc EF: 40.3 %
P 1/2 time: 318 msec
S' Lateral: 4.51 cm
Single Plane A2C EF: 38.3 %
Single Plane A4C EF: 45.9 %

## 2020-09-21 NOTE — Progress Notes (Signed)
*  PRELIMINARY RESULTS* Echocardiogram 2D Echocardiogram has been performed.  Katherine Larson 09/21/2020, 11:21 AM

## 2020-10-04 NOTE — Progress Notes (Signed)
Cardiology Office Note    Date:  10/12/2020  ID:  Katherine Larson, DOB 1946-10-28, MRN [id number] PCP:  Harlan Stains, MD  Cardiologist:  Dr. [name] Cancel Linna Hoff), EP - Dr. [name]   Chief Complaint: CABG   History of Present Illness:    74 y.o. CABG with LIMA 1996 EF 45-50% by TTE 01/19/17  NSVT, PAT, LBBB Hyperthyroidism  Activity limited by chronic back pain.  Has stimulator  TTE  03/17/20 showed drop in EF to 35-40% CAth 03/26/20 patient LIMA to LAD no significant dx in RCA/Circumflex. F/U echo 09/21/20 EF estimated at 30-35% Started on entresto Has been seen in past by GT with no AICD indicated but With newly decreased EF needs f/u cardiac MRI and reassment   She cannot afford Entresto and feels it makes her very sleepy No chest pain   Has family coming up from [facility] and [place] for Thanksgiving     Past Medical History:  Diagnosis Date  . Anemia    anemia of chronic disease +/- IDA followed by Dr. Earlie Server. received iron infusions in the past  . Asthma   . Borderline glaucoma   . CAD (coronary artery disease)    a. s/p CABG 1996. b. low risk nuc 2011. c. LHC 04/2016 due to drop in EF -> occluded native LAD,  widely patent sequential LIMA-D1-LAD.  Marland Kitchen Chronic kidney disease    "mild stage of kidney disease"  . Chronic systolic CHF (congestive heart failure) ([place])   . Complication of anesthesia   . DM2 (diabetes mellitus, type 2) ([place])    not on any medicine for this at this time, 05/2016  . GERD (gastroesophageal reflux disease)   . Gout   . History of hiatal hernia    in 70s  . History of pneumonia    March, 2016  . HLD (hyperlipidemia)   . HTN (hypertension)   . Hyperthyroidism 01/21/2016  . Inappropriate sinus tachycardia   . LBBB (left bundle branch block)    a. Seen in 04/2016  . Macular degeneration    left  . Myocardial infarction ([place])    1996  . Neuropathy   . NICM (nonischemic cardiomyopathy) ([place])    a. Dx 04/2016 - EF 25-30%, diffuse HK, elevated LVEDP, mild  MR, mod LAE.  Marland Kitchen NSVT (nonsustained ventricular tachycardia) ([facility])   . PAT (paroxysmal atrial tachycardia) ([place])   . PONV (postoperative nausea and vomiting)    "after just about every surgery I've had"  . Rheumatoid arthritis ([facility])    RA  . Seasonal allergies     Past Surgical History:  Procedure Laterality Date  . ABDOMINAL HYSTERECTOMY    . APPENDECTOMY    . BACK SURGERY    . BIOPSY  04/27/2020   Procedure: BIOPSY;  Surgeon: Daneil Dolin, MD;  [facility];  Service: Endoscopy;;  ascending colon biopsy  . CARDIAC CATHETERIZATION N/A 05/01/2016   Procedure: Right/Left Heart Cath and Coronary/Graft Angiography;  Surgeon: Leonie Man, MD;  Location: [place] [facility];  Service: Cardiovascular;  Laterality: N/A;  . CHOLECYSTECTOMY    . COLONOSCOPY  11/2011   Dr. Magod:ext/int hemorrhoids, diverticulosis sigmoid colon and distal desc colon, mid-desc colon hyperplastic polyps.   . COLONOSCOPY WITH PROPOFOL N/A 04/27/2020   Rourk: Diverticulosis, hemorrhoids, normal terminal ileum.  Random colon biopsies significant for chronic lymphocytic colitis  . CORONARY ARTERY BYPASS GRAFT  1996   2 vessels  . ESOPHAGOGASTRODUODENOSCOPY  11/2011   Dr. Watt Climes: small  hiatal hernia, one non-bleeding superficial gastric ulcer, medium-sized diverticulum in area of papilla  . ESOPHAGOGASTRODUODENOSCOPY N/A 12/29/2015   Dr. Gala Romney: Chronic inactive gastritis, normal esophagus status post dilation, duodenal diverticula  . ESOPHAGOGASTRODUODENOSCOPY (EGD) WITH PROPOFOL N/A 07/24/2019   Dr. Gala Romney: Normal esophagus status post empiric dilation, small hiatal hernia, large D2 diverticulum  . EYE SURGERY Bilateral    cataract removal  . FOOT SURGERY     removal bone spur  . FRACTURE SURGERY Right    arm  . GIVENS CAPSULE STUDY  11/2012   Dr. Watt Climes: minimal gastritis, normal small bowel capsule  . HERNIA REPAIR     hiatal hernia  . MALONEY DILATION N/A 07/24/2019   Procedure: Venia Minks DILATION;   Surgeon: Daneil Dolin, MD;  [facility];  Service: Endoscopy;  Laterality: N/A;  . MAXIMUM ACCESS (MAS)POSTERIOR LUMBAR INTERBODY FUSION (PLIF) 1 LEVEL N/A 08/14/2013   Procedure:  MAXIMUM ACCESS SURGERY(MAS) POSTERIOR LUMBAR INTERBODY FUSION LUMBAR THREE-FOUR ;  Surgeon: Eustace Moore, MD;  Location: [place] [facility];  Service: Neurosurgery;  Laterality: N/A;   MAXIMUM ACCESS SURGERY(MAS) POSTERIOR LUMBAR INTERBODY FUSION LUMBAR THREE-FOUR   . PTCA    . RIGHT/LEFT HEART CATH AND CORONARY ANGIOGRAPHY N/A 03/26/2020   Procedure: RIGHT/LEFT HEART CATH AND CORONARY ANGIOGRAPHY;  Surgeon: Leonie Man, MD;  Location: [place] [facility];  Service: Cardiovascular;  Laterality: N/A;  . SPINAL CORD STIMULATOR INSERTION N/A 06/16/2016   Procedure: LUMBAR SPINAL CORD STIMULATOR INSERTION;  Surgeon: Clydell Hakim, MD;  Location: [place] [facility]UR[facility] OR[facility];  Service: Neurosurgery;  Laterality: N/A;  LUMBAR SPINAL CORD STIMULATOR INSERTION  . tens  05/2016  . TONSILLECTOMY      Current Medications: Outpatient Medications Prior to Visit  Medication Sig Dispense Refill  . albuterol (PROVENTIL HFA;VENTOLIN HFA) 108 (90 Base) MCG/ACT inhaler Take 2 puffs 3 times a day and every 4 hours as needed.    Marland Kitchen allopurinol (ZYLOPRIM) 300 MG tablet Take 300 mg by mouth daily.    Marland Kitchen aspirin EC 81 MG EC tablet Take 1 tablet (81 mg total) by mouth daily.    Marland Kitchen atorvastatin (LIPITOR) 80 MG tablet Take 80 mg by mouth daily.    . budesonide (ENTOCORT EC) 3 MG 24 hr capsule Take 1 capsule (3 mg total) by mouth daily. 90 capsule 1  . calcium carbonate (TUMS - DOSED IN MG ELEMENTAL CALCIUM) 500 MG chewable tablet Chew 1 tablet by mouth as needed for indigestion or heartburn.    . carvedilol (COREG) 12.5 MG tablet TAKE 1 TABLET BY MOUTH TWO  TIMES DAILY 180 tablet 3  . cholecalciferol (VITAMIN D3) 25 MCG (1000 UT) tablet Take 1,000 Units by mouth daily.    . fexofenadine (ALLEGRA) 180 MG tablet Take 180 mg by mouth daily.      .  fluticasone (FLONASE) 50 MCG/ACT nasal spray Place 2 sprays into both nostrils daily as needed for allergies.     . furosemide (LASIX) 40 MG tablet Take 40 mg by mouth 2 (two) times daily.     Marland Kitchen gabapentin (NEURONTIN) 300 MG capsule Take 300 mg by mouth 3 (three) times daily.     Marland Kitchen HYDROcodone-acetaminophen (NORCO) 7.5-325 MG tablet Take 1 tablet by mouth every 6 (six) hours.     . iron polysaccharides (NIFEREX) 150 MG capsule Take 150 mg by mouth daily.    . Lancets (ONETOUCH DELICA PLUS [id number]) [place]     . leflunomide (ARAVA) 20 MG tablet Take 20 mg by  mouth daily.    Marland Kitchen loperamide (IMODIUM A-D) 2 MG tablet Take 2 mg by mouth 4 (four) times daily as needed for diarrhea or loose stools.    Marland Kitchen MAGNESIUM-OXIDE 400 (241.3 Mg) MG tablet TAKE 1 TABLET(400 MG) BY MOUTH DAILY 30 tablet 6  . meclizine (ANTIVERT) 12.5 MG tablet Take 12.5 mg by mouth 3 (three) times daily as needed for dizziness.    . metFORMIN (GLUCOPHAGE) 500 MG tablet Take 500 mg by mouth in the morning and at bedtime.     . montelukast (SINGULAIR) 10 MG tablet Take 10 mg by mouth at bedtime.    . Multiple Vitamins-Minerals (OCUVITE ADULT 50+ PO) Take 1 tablet by mouth daily.    . nitroGLYCERIN (NITROSTAT) 0.4 MG SL tablet Place 1 tablet (0.4 mg total) under the tongue every 5 (five) minutes as needed for chest pain. Reported on 03/24/2016 25 tablet prn  . pantoprazole (PROTONIX) 40 MG tablet TAKE 1 TABLET BY MOUTH  TWICE DAILY BEFORE A MEAL 180 tablet 3  . Polyethyl Glycol-Propyl Glycol (SYSTANE OP) Apply 1 drop to eye 3 (three) times daily as needed (Dry Eyes).    . potassium chloride SA (K-DUR,KLOR-CON) 20 MEQ tablet Take 1 tablet (20 mEq total) by mouth 2 (two) times daily. 40 tablet 11  . sacubitril-valsartan (ENTRESTO) 49-51 MG Take 1 tablet by mouth 2 (two) times daily. 60 tablet 11  . triamcinolone cream (KENALOG) 0.1 % Apply 1 application topically daily as needed (for irritation).      Facility-Administered Medications Prior to  Visit  Medication Dose Route Frequency Provider Last Rate Last Admin  . sodium chloride flush (NS) 0.9 % injection 3 mL  3 mL Intravenous Q12H Imogene Burn, PA-C         Allergies:   Biaxin [clarithromycin] and Penicillins   Social History   Socioeconomic History  . Marital status: Married    Spouse name: Not on file  . Number of children: 3  . Years of education: Not on file  . Highest education level: Not on file  Occupational History  . Not on file  Tobacco Use  . Smoking status: Former Smoker    Packs/day: 0.75    Years: 15.00    Pack years: 11.25    Types: Cigarettes    Quit date: 11/20/1994    Years since quitting: 25.9  . Smokeless tobacco: Never Used  . Tobacco comment: Quit in 1996  Vaping Use  . Vaping Use: Never used  Substance and Sexual Activity  . Alcohol use: No    Alcohol/week: 0.0 standard drinks  . Drug use: No  . Sexual activity: Yes  Other Topics Concern  . Not on file  Social History Narrative   2 living children, one deceased age 59   Social Determinants of Health   Financial Resource Strain:   . Difficulty of Paying Living Expenses: Not on file  Food Insecurity:   . Worried About Charity fundraiser in the Last Year: Not on file  . Ran Out of Food in the Last Year: Not on file  Transportation Needs:   . Lack of Transportation (Medical): Not on file  . Lack of Transportation (Non-Medical): Not on file  Physical Activity:   . Days of Exercise per Week: Not on file  . Minutes of Exercise per Session: Not on file  Stress:   . Feeling of Stress : Not on file  Social Connections:   . Frequency of Communication with Friends  and Family: Not on file  . Frequency of Social Gatherings with Friends and Family: Not on file  . Attends Religious Services: Not on file  . Active Member of Clubs or Organizations: Not on file  . Attends Archivist Meetings: Not on file  . Marital Status: Not on file     Family History:  The patient's  family history includes Bladder Cancer in her brother; Cervical cancer in her daughter; Heart attack in her father and mother.   ROS:   Please see the history of present illness.  All other systems are reviewed and otherwise negative.    PHYSICAL EXAM:   VS:  BP 136/70   Pulse 82   Ht 5' 2.5" (1.588 m)   Wt 65.3 kg   SpO2 95%   BMI 25.92 kg/m   BMI: Body mass index is 25.92 kg/m. Affect appropriate Healthy:  appears stated age 5: normal Neck supple with no adenopathy JVP normal no bruits no thyromegaly Lungs clear with no wheezing and good diaphragmatic motion Heart:  S1/S2 no murmur, no rub, gallop or click PMI normal post sternotomy  Abdomen: benighn, BS positve, no tenderness, no AAA no bruit.  No HSM or HJR Distal pulses intact with no bruits No edema Neuro non-focal Skin warm and dry No muscular weakness   Wt Readings from Last 3 Encounters:  10/12/20 65.3 kg  07/02/20 64 kg  05/31/20 63 kg      Studies/Labs Reviewed:   EKG:  SR LBBB chronic no acute changes   Recent Labs: 03/23/2020: Platelets 225 03/26/2020: Hemoglobin 10.2; Hemoglobin 10.2 05/31/2020: BUN 25; Creatinine, Ser 0.83; Potassium 3.8; Sodium 138   Lipid Panel    Component Value Date/Time   CHOL 112 12/19/2007 0910   TRIG 177 (H) 12/19/2007 0910   HDL 30.2 (L) 12/19/2007 0910   CHOLHDL 3.7 CALC 12/19/2007 0910   VLDL 35 12/19/2007 0910   LDLCALC 46 12/19/2007 0910    Additional studies/ records that were reviewed today include:  Echo:  01/19/17 EF 45-50% trivial MR AV sclerosis      ASSESSMENT & PLAN:   1. CHF: echo 01/19/17 EF 45-50%  Most recent 09/21/20 down to 30-35%  Unable to afford/tolerate Entresto Change to cozaar 100 mg daily f/u cardiac MRI in 3 months and see GT to decide on biV AICD  2. CAD - continue ASA, BB, statin for now. Cath 03/26/20 patent LIMA to LAD with no significant disease in RCA/Circumflex  3. NSVT - continue beta blocker Seen by GT in past. Will order cardiac  MRI to assess EF/viability and refer back if EF <35%  4. HTN:  Well controlled.  Continue current medications and low sodium Dash type diet.   5. DM:  Discussed low carb diet.  Target hemoglobin A1c is 6.5 or less.  Continue current medications. 6. Chol:  F/u labs with primary continue statin  7. Ortho spine stimulator working well  8. GI:  GERD dysphagia f/u Magod post dilatation continue protonix    Cardiac MRI viability Refer Dr [name] EP if EF <35%  D/C Delene Loll start [place]

## 2020-10-12 ENCOUNTER — Other Ambulatory Visit: Payer: Self-pay

## 2020-10-12 ENCOUNTER — Other Ambulatory Visit: Payer: Self-pay | Admitting: Cardiovascular Disease

## 2020-10-12 ENCOUNTER — Encounter: Payer: Self-pay | Admitting: Cardiovascular Disease

## 2020-10-12 ENCOUNTER — Ambulatory Visit: Payer: Medicare Other | Admitting: Cardiovascular Disease

## 2020-10-12 VITALS — BP 136/70 | HR 82 | Ht 62.5 in | Wt 144.0 lb

## 2020-10-12 DIAGNOSIS — I255 Ischemic cardiomyopathy: Secondary | ICD-10-CM | POA: Diagnosis not present

## 2020-10-12 DIAGNOSIS — I517 Cardiomegaly: Secondary | ICD-10-CM

## 2020-10-12 MED ORDER — LOSARTAN POTASSIUM 100 MG PO TABS
100.0000 mg | ORAL_TABLET | Freq: Every day | ORAL | 3 refills | Status: DC
Start: 2020-10-12 — End: 2020-10-12

## 2020-10-12 MED ORDER — LOSARTAN POTASSIUM 100 MG PO TABS: 100.0000 mg | ORAL_TABLET | Freq: Every day | ORAL | 3 refills | Status: DC

## 2020-10-12 NOTE — Addendum Note (Signed)
Addended by: Janan Halter F on: 10/12/2020 10:59 AM   Modules accepted: Orders

## 2020-10-12 NOTE — Telephone Encounter (Signed)
Refilled cozaar to [facility]

## 2020-10-12 NOTE — Telephone Encounter (Signed)
NEW MESSAGE    CHANGE PHARMACY FOR THE COZAAR  PT NEEDS IT TO GO TO [facility] ON [address] -  NOT MAIL ORDER SHE IS OUT OF MEDS

## 2020-10-12 NOTE — Patient Instructions (Addendum)
Medication Instructions:  STOP Entresto    START Cozaar 100 mg daily    *If you need a refill on your cardiac medications before your next appointment, please call your pharmacy*   Lab Work: None today If you have labs (blood work) drawn today and your tests are completely normal, you will receive your results only by: Marland Kitchen MyChart Message (if you have MyChart) OR . A paper copy in the mail If you have any lab test that is abnormal or we need to change your treatment, we will call you to review the results.   Testing/Procedures: We will schedule you a cardiac MRI.We will call you with apt.    Follow-Up: At [facility], you and your health needs are our priority.  As part of our continuing mission to provide you with exceptional heart care, we have created designated Provider Care Teams.  These Care Teams include your primary Cardiologist (physician) and Advanced Practice Providers (APPs -  Physician Assistants and Nurse Practitioners) who all work together to provide you with the care you need, when you need it.  We recommend signing up for the patient portal called "MyChart".  Sign up information is provided on this After Visit Summary.  MyChart is used to connect with patients for Virtual Visits (Telemedicine).  Patients are able to view lab/test results, encounter notes, upcoming appointments, etc.  Non-urgent messages can be sent to your provider as well.   To learn more about what you can do with MyChart, go to [email or web address].    Your next appointment:   3 month(s)  The format for your next appointment:   In Person  Provider:   Cristopher Peru, MD after your cardiac MRI   Other Instructions None

## 2020-10-12 NOTE — Addendum Note (Signed)
Addended by: Janan Halter F on: 10/12/2020 11:03 AM   Modules accepted: Orders

## 2020-11-02 ENCOUNTER — Ambulatory Visit: Payer: Medicare Other | Admitting: Gastroenterology

## 2020-11-02 ENCOUNTER — Encounter: Payer: Self-pay | Admitting: Gastroenterology

## 2020-11-02 ENCOUNTER — Other Ambulatory Visit: Payer: Self-pay

## 2020-11-02 VITALS — BP 118/66 | HR 81 | Temp 97.3°F | Ht 62.5 in | Wt 149.2 lb

## 2020-11-02 DIAGNOSIS — K52832 Lymphocytic colitis: Secondary | ICD-10-CM | POA: Diagnosis not present

## 2020-11-02 DIAGNOSIS — K219 Gastro-esophageal reflux disease without esophagitis: Secondary | ICD-10-CM

## 2020-11-02 DIAGNOSIS — D649 Anemia, unspecified: Secondary | ICD-10-CM

## 2020-11-02 NOTE — Progress Notes (Signed)
Primary Care Physician: Harlan Stains, MD  Primary Gastroenterologist:  Garfield Cornea, MD   Chief Complaint  Patient presents with  . Abdominal Pain    F/U anemia, occasional acid reflux    HPI: Katherine Larson is a 74 y.o. female here for follow-up.  She was last seen in August.  She has a history of dysphagia, GERD, diarrhea.  Dysphagia previously evaluated with EGD, esophagus dilated but she had persistent symptoms. Barium pill esophagram November 2020 with moderate esophageal dysmotility with prolonged thoracic retention the contrast, laryngeal penetration to the level of vocal cords, prominent GERD. She was offered modified barium swallow study plus or minus esophageal manometry but was not interested at the time due to other pending medical issues.  Colonoscopy June 2021 showed diverticulosis, hemorrhoids, normal TI.  Random colon biopsies significant for chronic lymphocytic colitis.  She became significantly constipated on Entocort 9 mg daily and persisted on 6mg  daily. When she stopped Entorcort the diarrhea returned. After last OV, suggested trying 3mg  daily and encouraged stopping PPI if tolerated and transitioning to H2 blocker duet to some association with PPI and microscopic colitis. Also scheduled her for labs for f/u anemia.   Patient states that she did not recall these instructions.  She did go back on 3 mg of budesonide daily as needed.  States she only took it if she had diarrhea.  She did not complete blood work.  She still remains on PPI.  Currently taking budesonide 3mg  only taking about three times per week. Would start when diarrhea starts and then hold for constipation. Diarrhea has not been as bad last several months.  Stools can be from hard to loose.  No melena or rectal bleeding.  Denies abdominal pain.  No heartburn.  No nausea or vomiting.    Current Outpatient Medications  Medication Sig Dispense Refill  . albuterol (PROVENTIL HFA;VENTOLIN HFA) 108  (90 Base) MCG/ACT inhaler Take 2 puffs 3 times a day and every 4 hours as needed.    Marland Kitchen allopurinol (ZYLOPRIM) 300 MG tablet Take 300 mg by mouth daily.    Marland Kitchen aspirin EC 81 MG EC tablet Take 1 tablet (81 mg total) by mouth daily.    Marland Kitchen atorvastatin (LIPITOR) 80 MG tablet Take 80 mg by mouth daily.    . budesonide (ENTOCORT EC) 3 MG 24 hr capsule Take 3 mg by mouth as needed. 90 capsule 1  . calcium carbonate (TUMS - DOSED IN MG ELEMENTAL CALCIUM) 500 MG chewable tablet Chew 1 tablet by mouth as needed for indigestion or heartburn.    . carvedilol (COREG) 12.5 MG tablet TAKE 1 TABLET BY MOUTH TWO  TIMES DAILY 180 tablet 3  . cholecalciferol (VITAMIN D3) 25 MCG (1000 UT) tablet Take 1,000 Units by mouth daily.    . fexofenadine (ALLEGRA) 180 MG tablet Take 180 mg by mouth daily.    . fluticasone (FLONASE) 50 MCG/ACT nasal spray Place 2 sprays into both nostrils daily as needed for allergies.     . furosemide (LASIX) 40 MG tablet Take 40 mg by mouth 2 (two) times daily.     Marland Kitchen gabapentin (NEURONTIN) 300 MG capsule Take 300 mg by mouth 3 (three) times daily.     Marland Kitchen HYDROcodone-acetaminophen (NORCO) 7.5-325 MG tablet Take 1 tablet by mouth every 6 (six) hours.    . iron polysaccharides (NIFEREX) 150 MG capsule Take 150 mg by mouth daily.    . Lancets (ONETOUCH DELICA PLUS [id number]) [place]     .  leflunomide (ARAVA) 20 MG tablet Take 20 mg by mouth daily.    Marland Kitchen loperamide (IMODIUM A-D) 2 MG tablet Take 2 mg by mouth as needed for diarrhea or loose stools.    Marland Kitchen losartan (COZAAR) 100 MG tablet Take 1 tablet (100 mg total) by mouth daily. 90 tablet 3  . MAGNESIUM-OXIDE 400 (241.3 Mg) MG tablet TAKE 1 TABLET(400 MG) BY MOUTH DAILY 30 tablet 6  . meclizine (ANTIVERT) 12.5 MG tablet Take 12.5 mg by mouth 3 (three) times daily as needed for dizziness.    . metFORMIN (GLUCOPHAGE) 500 MG tablet Take 500 mg by mouth in the morning and at bedtime.     . montelukast (SINGULAIR) 10 MG tablet Take 10 mg by mouth at  bedtime.    . Multiple Vitamins-Minerals (OCUVITE ADULT 50+ PO) Take 1 tablet by mouth daily.    . nitroGLYCERIN (NITROSTAT) 0.4 MG SL tablet Place 1 tablet (0.4 mg total) under the tongue every 5 (five) minutes as needed for chest pain. Reported on 03/24/2016 25 tablet prn  . pantoprazole (PROTONIX) 40 MG tablet TAKE 1 TABLET BY MOUTH  TWICE DAILY BEFORE A MEAL 180 tablet 3  . Polyethyl Glycol-Propyl Glycol (SYSTANE OP) Apply 1 drop to eye 3 (three) times daily as needed (Dry Eyes).    . potassium chloride SA (K-DUR,KLOR-CON) 20 MEQ tablet Take 1 tablet (20 mEq total) by mouth 2 (two) times daily. 40 tablet 11  . triamcinolone cream (KENALOG) 0.1 % Apply 1 application topically daily as needed (for irritation).      Current Facility-Administered Medications  Medication Dose Route Frequency Provider Last Rate Last Admin  . sodium chloride flush (NS) 0.9 % injection 3 mL  3 mL Intravenous Q12H Imogene Burn, PA-C        Allergies as of 11/02/2020 - Review Complete 11/02/2020  Allergen Reaction Noted  . Biaxin [clarithromycin] Rash and Other (See Comments) 02/02/2010  . Penicillins Rash and Other (See Comments) 02/02/2010    ROS:  General: Negative for anorexia, weight loss, fever, chills, fatigue, weakness. ENT: Negative for hoarseness, difficulty swallowing , nasal congestion. CV: Negative for chest pain, angina, palpitations, dyspnea on exertion, peripheral edema.  Respiratory: Negative for dyspnea at rest, dyspnea on exertion, cough, sputum, wheezing.  GI: See history of present illness. GU:  Negative for dysuria, hematuria, urinary incontinence, urinary frequency, nocturnal urination.  Endo: Negative for unusual weight change.    Physical Examination:   BP 118/66   Pulse 81   Temp (!) 97.3 F (36.3 C) (Temporal)   Ht 5' 2.5" (1.588 m)   Wt 149 lb 3.2 oz (67.7 kg)   BMI 26.85 kg/m   General: Well-nourished, well-developed in no acute distress.  Eyes: No icterus. Mouth:  masked Lungs: Clear to auscultation bilaterally.  Heart: Regular rate and rhythm, no murmurs rubs or gallops.  Abdomen: Bowel sounds are normal, nontender, nondistended, no hepatosplenomegaly or masses, no abdominal bruits or hernia , no rebound or guarding.   Extremities: No lower extremity edema. No clubbing or deformities. Neuro: Alert and oriented x 4   Skin: Warm and dry, no jaundice.   Psych: Alert and cooperative, normal mood and affect.  Imaging Studies: No results found.  Impression/plan:  74 year old female presenting for follow-up of lymphocytic colitis and GERD.  Lymphocytic colitis: Interestingly, she develops constipation fairly rapidly with Entocort.  Did not tolerate 9 mg daily or 6 mg daily.  Currently does have recurrent diarrhea when she does not take Entocort.  Taking Entocort when diarrhea starts.  Discussed with patient, Entocort is used to try to obtain remission and to be effective she needs to use on a regular basis. Suggest she try Entocort 3 mg daily for the next 30 days.  If develops constipation, she can try taking every other day.  Once 30 days completed, stop medication.  Monitor stools.  If having more than 3 stools daily and stools are loose to watery then she should let us know.  She can also use Imodium for occasional diarrhea.  GERD: Since medication such as pantoprazole can be linked with lymphocytic colitis, suggest she try to wean off pantoprazole if tolerated.  Start by backing down to once daily for 2 weeks.  If tolerated then stop pantoprazole.  May use Pepcid 20 mg twice daily.  Anemia: update labs.

## 2020-11-02 NOTE — Patient Instructions (Signed)
Lymphocytic colitis: Try taking Entocort 3 mg daily for the next 30 days.  If you develop constipation, you can take it every other day. Once you complete 30 days, stop the medication. Monitor your stools. If you are having more than 3 stools daily and stools are loose to watery, then let me know. You can use imodium for occasional diarrhea.    GERD: Medications such as pantoprazole can be linked with lymphocytic colitis. Try weaning off of pantoprazole if tolerated.  Start with backing down to once daily for 2 weeks.  If you tolerate this, then stop altogether.  In the place of pantoprazole, you can take Pepcid 20 mg twice daily.  Anemia: Please have your lab work done to follow-up on anemia.  We will contact you with results as available.   Return to the office in 4 months, call sooner if needed.

## 2020-11-04 LAB — CBC WITH DIFFERENTIAL/PLATELET
Absolute Monocytes: 770 cells/uL (ref 200–950)
Basophils Absolute: 33 cells/uL (ref 0–200)
Basophils Relative: 0.3 %
Eosinophils Absolute: 264 cells/uL (ref 15–500)
Eosinophils Relative: 2.4 %
HCT: 32.8 % — ABNORMAL LOW (ref 35.0–45.0)
Hemoglobin: 10.9 g/dL — ABNORMAL LOW (ref 11.7–15.5)
Lymphs Abs: 3102 cells/uL (ref 850–3900)
MCH: 30.3 pg (ref 27.0–33.0)
MCHC: 33.2 g/dL (ref 32.0–36.0)
MCV: 91.1 fL (ref 80.0–100.0)
MPV: 10.4 fL (ref 7.5–12.5)
Monocytes Relative: 7 %
Neutro Abs: 6831 cells/uL (ref 1500–7800)
Neutrophils Relative %: 62.1 %
Platelets: 264 10*3/uL (ref 140–400)
RBC: 3.6 10*6/uL — ABNORMAL LOW (ref 3.80–5.10)
RDW: 13.8 % (ref 11.0–15.0)
Total Lymphocyte: 28.2 %
WBC: 11 10*3/uL — ABNORMAL HIGH (ref 3.8–10.8)

## 2020-11-04 LAB — IRON,TIBC AND FERRITIN PANEL
%SAT: 16 % (calc) (ref 16–45)
Ferritin: 42 ng/mL (ref 16–288)
Iron: 65 ug/dL (ref 45–160)
TIBC: 417 mcg/dL (calc) (ref 250–450)

## 2020-11-05 ENCOUNTER — Encounter: Payer: Self-pay | Admitting: Gastroenterology

## 2020-11-08 NOTE — Progress Notes (Signed)
CC'ED TO PCP 

## 2020-11-17 ENCOUNTER — Other Ambulatory Visit: Payer: Self-pay

## 2020-11-17 ENCOUNTER — Telehealth: Payer: Self-pay | Admitting: *Deleted

## 2020-11-17 DIAGNOSIS — I255 Ischemic cardiomyopathy: Secondary | ICD-10-CM

## 2020-11-17 DIAGNOSIS — D649 Anemia, unspecified: Secondary | ICD-10-CM

## 2020-11-17 DIAGNOSIS — I428 Other cardiomyopathies: Secondary | ICD-10-CM

## 2020-11-17 NOTE — Telephone Encounter (Signed)
   [facility] Pre-operative Risk Assessment    HEARTCARE STAFF: - Please ensure there is not already an duplicate clearance open for this procedure. - Under Visit Info/Reason for Call, type in Other and utilize the format Clearance MM/DD/YY or Clearance TBD. Do not use dashes or single digits. - If request is for dental extraction, please clarify the # of teeth to be extracted.  Request for surgical clearance:  1. What type of surgery is being performed?  SPINAL CORD STIMULATOR BATTERY CHANGE   2. When is this surgery scheduled?  TBD   3. What type of clearance is required (medical clearance vs. Pharmacy clearance to hold med vs. Both)?  BOTH  4. Are there any medications that need to be held prior to surgery and how long? ASPIRIN   5. Practice name and name of physician performing surgery?  [facility]   6. What is the office phone number?  [phone]   7.   What is the office fax number?  [phone] ATTN:  JESSICA  8.   Anesthesia type (None, local, MAC, general) ?  MAC   Jeanann Lewandowsky 11/17/2020, 10:25 AM  _________________________________________________________________   (provider comments below)

## 2020-11-17 NOTE — Telephone Encounter (Signed)
   Primary Cardiologist: Charlton Haws, MD  Chart reviewed as part of pre-operative protocol coverage. Katherine Larson is a 74 yo female with hx of CHF, CAD, NSVT, HTN, DM2. She has an ortho spine stimulator. Last seen 10/12/20 by Dr. Eden Emms, her [name] was transitioned to Losartan due to cost and she was recommended for cardiac MRI for reassessment of LVEF. MRI not yet scheduled.  Will reach out to primary cardiologist regarding if he would like cardiac MRI completed prior to spinal cord stimulator battery change and recommendations regarding holding Aspirin.   Alver Sorrow, NP

## 2020-11-22 NOTE — Telephone Encounter (Signed)
Ok to change battery out and hold ASA Can have f/u echo instead of MRI for stimulator

## 2020-11-22 NOTE — Telephone Encounter (Signed)
Called pt and notified that she will need ECHO for ICD battery change. Verbalized understanding. Message sent to [facility] scheduling.

## 2020-11-23 DIAGNOSIS — M47816 Spondylosis without myelopathy or radiculopathy, lumbar region: Secondary | ICD-10-CM | POA: Diagnosis not present

## 2020-11-23 NOTE — Telephone Encounter (Signed)
    Echocardiogram scheduled for 12/22/20 at MDs request.

## 2020-11-25 DIAGNOSIS — I5022 Chronic systolic (congestive) heart failure: Secondary | ICD-10-CM | POA: Diagnosis not present

## 2020-11-25 DIAGNOSIS — E1140 Type 2 diabetes mellitus with diabetic neuropathy, unspecified: Secondary | ICD-10-CM | POA: Diagnosis not present

## 2020-11-25 DIAGNOSIS — M109 Gout, unspecified: Secondary | ICD-10-CM | POA: Diagnosis not present

## 2020-11-25 DIAGNOSIS — E1121 Type 2 diabetes mellitus with diabetic nephropathy: Secondary | ICD-10-CM | POA: Diagnosis not present

## 2020-11-25 DIAGNOSIS — E785 Hyperlipidemia, unspecified: Secondary | ICD-10-CM | POA: Diagnosis not present

## 2020-11-28 ENCOUNTER — Other Ambulatory Visit: Payer: Self-pay | Admitting: Internal Medicine

## 2020-11-29 ENCOUNTER — Encounter: Payer: Self-pay | Admitting: Internal Medicine

## 2020-11-30 ENCOUNTER — Telehealth: Payer: Self-pay | Admitting: Cardiovascular Disease

## 2020-11-30 NOTE — Telephone Encounter (Signed)
Spoke with patient regarding preferred scheduling for the Cardiac MRI ordered by Dr. [name] Cancel for [facility] patient as soon as we hear from the insurance prior authorization, I will be in touch with the appointment

## 2020-12-03 NOTE — Telephone Encounter (Signed)
This patient has a pain stimulator and will not be able to have the Cardiac MRI ordered by Dr. [name] Cancel.  Please advise if other testing is ordered.

## 2020-12-03 NOTE — Telephone Encounter (Signed)
Called to speak with patient regarding the appointment 01/06/21 for the Cardiac MRI ordered by Dr. Esmeralda Links states she has a pain stimulator---informed patient I will call the technician and check to see if we can still do the study.  She voiced her understanding.

## 2020-12-03 NOTE — Telephone Encounter (Signed)
Ok to do echo instead in February and then have her see Dr [name]

## 2020-12-03 NOTE — Telephone Encounter (Signed)
Left message informing patient Cardiac MRI cannot be done due to the patient having a pain stimulator----pt to keep appointment on 12/22/20 for the Echo and her follow up with Dr. [name] on 01/18/21.  Requested patient call with questions or concerns.

## 2020-12-16 DIAGNOSIS — Z4542 Encounter for adjustment and management of neuropacemaker (brain) (peripheral nerve) (spinal cord): Secondary | ICD-10-CM | POA: Diagnosis not present

## 2020-12-16 DIAGNOSIS — Z9689 Presence of other specified functional implants: Secondary | ICD-10-CM | POA: Diagnosis not present

## 2020-12-16 DIAGNOSIS — M47816 Spondylosis without myelopathy or radiculopathy, lumbar region: Secondary | ICD-10-CM | POA: Diagnosis not present

## 2020-12-22 ENCOUNTER — Encounter: Payer: Self-pay | Admitting: *Deleted

## 2020-12-22 ENCOUNTER — Other Ambulatory Visit: Payer: Self-pay

## 2020-12-22 ENCOUNTER — Ambulatory Visit (HOSPITAL_COMMUNITY): Payer: Medicare Other | Attending: Cardiology

## 2020-12-22 ENCOUNTER — Other Ambulatory Visit: Payer: Self-pay | Admitting: *Deleted

## 2020-12-22 DIAGNOSIS — D649 Anemia, unspecified: Secondary | ICD-10-CM

## 2020-12-22 DIAGNOSIS — I428 Other cardiomyopathies: Secondary | ICD-10-CM

## 2020-12-22 DIAGNOSIS — I255 Ischemic cardiomyopathy: Secondary | ICD-10-CM

## 2020-12-22 LAB — ECHOCARDIOGRAM COMPLETE
Area-P 1/2: 3.48 cm2
S' Lateral: 3.8 cm

## 2020-12-24 NOTE — Telephone Encounter (Addendum)
   Primary Cardiologist: Jenkins Rouge, MD  Chart reviewed as part of pre-operative protocol coverage. Per Dr. [name] Cancel, Katherine Larson would be at acceptable risk for the planned procedure without further cardiovascular testing.   Patient can hold aspirin 5-7 days prior to her upcoming procedure with plans to restart as soon as she is cleared to do so by her surgeon.   I will route this recommendation to the requesting party via Epic fax function and remove from pre-op pool.  Please call with questions.  Abigail Butts, PA-C 12/24/2020, 3:18 PM

## 2020-12-27 ENCOUNTER — Other Ambulatory Visit: Payer: Self-pay | Admitting: Pain Medicine

## 2021-01-06 ENCOUNTER — Other Ambulatory Visit (HOSPITAL_COMMUNITY): Payer: Medicare Other

## 2021-01-10 ENCOUNTER — Other Ambulatory Visit: Payer: Self-pay | Admitting: Cardiovascular Disease

## 2021-01-11 ENCOUNTER — Other Ambulatory Visit: Payer: Self-pay | Admitting: Family Medicine

## 2021-01-11 DIAGNOSIS — R911 Solitary pulmonary nodule: Secondary | ICD-10-CM

## 2021-01-11 NOTE — Telephone Encounter (Signed)
This is a [place] pt.  °

## 2021-01-17 ENCOUNTER — Other Ambulatory Visit (HOSPITAL_COMMUNITY)
Admission: RE | Admit: 2021-01-17 | Discharge: 2021-01-17 | Disposition: A | Discharge status: Home / Self Care | Payer: Medicare Other | Source: Ambulatory Visit | Attending: Pain Medicine | Admitting: Pain Medicine

## 2021-01-17 DIAGNOSIS — Z01812 Encounter for preprocedural laboratory examination: Secondary | ICD-10-CM | POA: Insufficient documentation

## 2021-01-17 DIAGNOSIS — Z20822 Contact with and (suspected) exposure to covid-19: Secondary | ICD-10-CM | POA: Diagnosis not present

## 2021-01-18 ENCOUNTER — Other Ambulatory Visit (HOSPITAL_COMMUNITY)
Admission: RE | Admit: 2021-01-18 | Discharge: 2021-01-18 | Disposition: A | Discharge status: Home / Self Care | Payer: Medicare Other | Source: Ambulatory Visit | Attending: Internal Medicine | Admitting: Internal Medicine

## 2021-01-18 ENCOUNTER — Encounter: Payer: Self-pay | Admitting: *Deleted

## 2021-01-18 ENCOUNTER — Encounter (HOSPITAL_COMMUNITY): Payer: Self-pay | Admitting: Pain Medicine

## 2021-01-18 ENCOUNTER — Encounter: Payer: Self-pay | Admitting: Internal Medicine

## 2021-01-18 ENCOUNTER — Other Ambulatory Visit: Payer: Self-pay

## 2021-01-18 ENCOUNTER — Ambulatory Visit: Payer: Medicare Other | Admitting: Internal Medicine

## 2021-01-18 VITALS — BP 122/68 | HR 88 | Ht 62.5 in | Wt 147.8 lb

## 2021-01-18 DIAGNOSIS — I5042 Chronic combined systolic (congestive) and diastolic (congestive) heart failure: Secondary | ICD-10-CM | POA: Diagnosis not present

## 2021-01-18 LAB — BASIC METABOLIC PANEL
Anion gap: 11 (ref 5–15)
BUN: 21 mg/dL (ref 8–23)
CO2: 28 mmol/L (ref 22–32)
Calcium: 9.4 mg/dL (ref 8.9–10.3)
Chloride: 100 mmol/L (ref 98–111)
Creatinine, Ser: 0.82 mg/dL (ref 0.44–1.00)
GFR, Estimated: >60 mL/min (ref >60)
Glucose, Bld: 125 mg/dL — ABNORMAL HIGH (ref 70–99)
Potassium: 4.4 mmol/L (ref 3.5–5.1)
Sodium: 139 mmol/L (ref 135–145)

## 2021-01-18 LAB — CBC WITH DIFFERENTIAL/PLATELET
Abs Immature Granulocytes: 0.12 10*3/uL — ABNORMAL HIGH (ref 0.00–0.07)
Basophils Absolute: 0.1 10*3/uL (ref 0.0–0.1)
Basophils Relative: 0 %
Eosinophils Absolute: 0.1 10*3/uL (ref 0.0–0.5)
Eosinophils Relative: 1 %
HCT: 35.6 % — ABNORMAL LOW (ref 36.0–46.0)
Hemoglobin: 11.1 g/dL — ABNORMAL LOW (ref 12.0–15.0)
Immature Granulocytes: 1 %
Lymphocytes Relative: 22 %
Lymphs Abs: 2.8 10*3/uL (ref 0.7–4.0)
MCH: 31 pg (ref 26.0–34.0)
MCHC: 31.2 g/dL (ref 30.0–36.0)
MCV: 99.4 fL (ref 80.0–100.0)
Monocytes Absolute: 0.7 10*3/uL (ref 0.1–1.0)
Monocytes Relative: 5 %
Neutro Abs: 9.2 10*3/uL — ABNORMAL HIGH (ref 1.7–7.7)
Neutrophils Relative %: 71 %
Platelets: 223 10*3/uL (ref 150–400)
RBC: 3.58 MIL/uL — ABNORMAL LOW (ref 3.87–5.11)
RDW: 16.1 % — ABNORMAL HIGH (ref 11.5–15.5)
WBC: 12.9 10*3/uL — ABNORMAL HIGH (ref 4.0–10.5)
nRBC: 0 % (ref 0.0–0.2)

## 2021-01-18 LAB — SARS CORONAVIRUS 2 (TAT 6-24 HRS): SARS Coronavirus 2: NEGATIVE

## 2021-01-18 MED ORDER — NITROGLYCERIN 0.4 MG SL SUBL: 0.4000 mg | SUBLINGUAL_TABLET | SUBLINGUAL | 3 refills | Status: DC | PRN

## 2021-01-18 NOTE — Patient Instructions (Addendum)
Medication Instructions:  Your physician recommends that you continue on your current medications as directed. Please refer to the Current Medication list given to you today.  Dates for Placement : 3/16, 3/18, 3/21, 3/28 Covid Screening 02/02/21   *If you need a refill on your cardiac medications before your next appointment, please call your pharmacy*   Lab Work: Your physician recommends that you return for lab work in: Today    If you have labs (blood work) drawn today and your tests are completely normal, you will receive your results only by: Marland Kitchen MyChart Message (if you have MyChart) OR . A paper copy in the mail If you have any lab test that is abnormal or we need to change your treatment, we will call you to review the results.   Testing/Procedures: NONE   Follow-Up: At [facility], you and your health needs are our priority.  As part of our continuing mission to provide you with exceptional heart care, we have created designated Provider Care Teams.  These Care Teams include your primary Cardiologist (physician) and Advanced Practice Providers (APPs -  Physician Assistants and Nurse Practitioners) who all work together to provide you with the care you need, when you need it.  We recommend signing up for the patient portal called "MyChart".  Sign up information is provided on this After Visit Summary.  MyChart is used to connect with patients for Virtual Visits (Telemedicine).  Patients are able to view lab/test results, encounter notes, upcoming appointments, etc.  Non-urgent messages can be sent to your provider as well.   To learn more about what you can do with MyChart, go to [email or web address].    Your next appointment:    3 Months after placement   The format for your next appointment:   In Person  Provider:   Cristopher Peru, MD   Other Instructions Thank you for choosing [facility]!

## 2021-01-18 NOTE — Anesthesia Preprocedure Evaluation (Addendum)
Anesthesia Evaluation  Patient identified by MRN, date of birth, ID band Patient awake    Reviewed: Allergy & Precautions, NPO status , Patient's Chart, lab work & pertinent test results  History of Anesthesia Complications (+) PONV and history of anesthetic complications  Airway Mallampati: III  TM Distance: >3 FB Neck ROM: Full    Dental  (+) Edentulous Upper, Edentulous Lower   Pulmonary shortness of breath and with exertion, asthma , COPD,  COPD inhaler, former smoker,    Pulmonary exam normal breath sounds clear to auscultation       Cardiovascular hypertension, Pt. on home beta blockers and Pt. on medications + CAD, + CABG and +CHF  Normal cardiovascular exam Rhythm:Regular Rate:Normal  Echo 12/22/20: 1. Left ventricular ejection fraction, by estimation, is 30%. The left ventricle has moderate to severely decreased function. The left ventricle demonstrates regional wall motion abnormalities with anteroseptal,  anterior, and apical severe hypokinesis. There is septal-lateral dyssynchrony consistent with LBBB. Left  ventricular diastolic parameters are consistent with Grade I diastolic dysfunction (impaired relaxation).  2. Right ventricular systolic function is normal. The right ventricular size is normal. Tricuspid regurgitation signal is inadequate for assessing PA pressure.  3. The mitral valve is normal in structure. Trivial mitral valve regurgitation. No evidence of mitral stenosis.  4. The aortic valve is tricuspid. Aortic valve regurgitation is trivial.  Mild aortic valve sclerosis is present, with no evidence of aortic valve stenosis.  5. The inferior vena cava is normal in size with greater than 50%  respiratory variability, suggesting right atrial pressure of 3 mmHg.    RHC/LHC 04/23/83: LV END DIASTOLIC PRESSURE and PCWP is normal. There is no aortic valve stenosis. Dist LM to Mid LAD lesion is 70% stenosed with  30% stenosed side branch in 1st Sept -> leading up to the stent Previously placed bare-metal stent in the mid LAD is 100% stenosed. LIMA-LAD graft was visualized by angiography and is normal in caliber. The graft exhibits no disease. The flow is not reversed. There is no competitive flow   SUMMARY Severe single-vessel disease with occluded LAD prior to bifurcation into the diagonal branch.  Otherwise widely patent native LCx, RCA and diagonal-LAD that are grafted. I suspect that the drop in EF is probably related to a nonischemic etiology or for microvascular ischemia.   Nuclear stress test does show mild basal anterior and anteroseptal reversibility in the setting of a large anteroseptal infarct.  I suspect that this is probably due to reduced flow at the site of the occluded LAD stent with minimal upstream septal perforators. Normal RIGHT HEART CATH pressures with low LVEDP and PCWP indicating mild dehydration -> indicates adequate diuresis Relatively normal cardiac output and index of 5.41 and 3.24 respectively.    Neuro/Psych  Headaches, negative psych ROS   GI/Hepatic Neg liver ROS, hiatal hernia, GERD  Medicated and Controlled,  Endo/Other  diabetes, Oral Hypoglycemic Agents  Renal/GU Renal disease     Musculoskeletal  (+) Arthritis , Gout   Abdominal   Peds  Hematology  (+) anemia , HLD   Anesthesia Other Findings Battery end of life of spinal cord stimulator  Reproductive/Obstetrics                           Anesthesia Physical Anesthesia Plan  ASA: III  Anesthesia Plan: MAC   Post-op Pain Management:    Induction: Intravenous  PONV Risk Score and Plan: 3 and Ondansetron, Dexamethasone, Propofol  infusion and Treatment may vary due to age or medical condition  Airway Management Planned: Nasal Cannula  Additional Equipment:   Intra-op Plan:   Post-operative Plan:   Informed Consent: I have reviewed the patients History and Physical,  chart, labs and discussed the procedure including the risks, benefits and alternatives for the proposed anesthesia with the patient or authorized representative who has indicated his/her understanding and acceptance.       Plan Discussed with: CRNA  Anesthesia Plan Comments: (PAT note written 01/18/2021 by Myra Gianotti, PA-C. Primary cardiologist is Dr. [name] Cancel. EP cardiologist is Dr. [name] (for ICD 02/04/21).  Preoperative cardiology input (for spinal cord stimulator battery change) outlined on 12/24/20 by Roby Lofts, PA-C: "Per Dr. Pierce Crane be at acceptable risk for the planned procedure without further cardiovascular testing.   Patient can hold aspirin 5-7 days prior to her upcoming procedure with plans to restart as soon as she is cleared to do so by her surgeon.")      Anesthesia Quick Evaluation

## 2021-01-18 NOTE — H&P (View-Only) (Signed)
HPI Mrs. Pharris returns today for followup. She is a pleasant 75 yo woman with a h/o HFrEF, on maximal medical therapy, who I saw almost 4 years ago for possible ICD insertion. Her EF has improved and we held off. She developed worsening dyspnea about a year ago and her meds have been adjusted but her EF remains down. She has not had syncope. She is pending a spinal stimulator change out.   Current Outpatient Medications  Medication Sig Dispense Refill  . albuterol (PROVENTIL HFA;VENTOLIN HFA) 108 (90 Base) MCG/ACT inhaler Take 2 puffs 3 times a day and every 4 hours as needed. (Patient taking differently: Inhale 2 puffs into the lungs every 4 (four) hours as needed for wheezing or shortness of breath.)    . allopurinol (ZYLOPRIM) 300 MG tablet Take 300 mg by mouth daily.    Marland Kitchen aspirin EC 81 MG EC tablet Take 1 tablet (81 mg total) by mouth daily.    Marland Kitchen atorvastatin (LIPITOR) 80 MG tablet Take 80 mg by mouth daily.    . budesonide (ENTOCORT EC) 3 MG 24 hr capsule Take 3 mg by mouth daily. 90 capsule 1  . calcium carbonate (TUMS - DOSED IN MG ELEMENTAL CALCIUM) 500 MG chewable tablet Chew 1 tablet by mouth as needed for indigestion or heartburn.    . Carboxymethylcellulose Sodium (THERATEARS OP) Place 1 drop into both eyes 3 (three) times daily as needed (dry eyes).    . carvedilol (COREG) 6.25 MG tablet Take 6.25 mg by mouth 2 (two) times daily with a meal.    . cholecalciferol (VITAMIN D3) 25 MCG (1000 UT) tablet Take 1,000 Units by mouth daily.    . fexofenadine (ALLEGRA) 180 MG tablet Take 180 mg by mouth daily.    . fluticasone (FLONASE) 50 MCG/ACT nasal spray Place 2 sprays into both nostrils daily as needed for allergies.    . furosemide (LASIX) 40 MG tablet Take 40 mg by mouth 2 (two) times daily.     Marland Kitchen gabapentin (NEURONTIN) 300 MG capsule Take 300 mg by mouth 3 (three) times daily.     Marland Kitchen HYDROcodone-acetaminophen (NORCO) 10-325 MG tablet Take 1 tablet by mouth every 6 (six) hours  as needed for moderate pain.    . iron polysaccharides (NIFEREX) 150 MG capsule Take 150 mg by mouth daily.    . Lancets (ONETOUCH DELICA PLUS [id number]) [place]     . leflunomide (ARAVA) 20 MG tablet Take 20 mg by mouth daily.    Marland Kitchen losartan (COZAAR) 100 MG tablet Take 1 tablet (100 mg total) by mouth daily. 90 tablet 3  . MAGNESIUM-OXIDE 400 (241.3 Mg) MG tablet TAKE 1 TABLET(400 MG) BY MOUTH DAILY 30 tablet 6  . meclizine (ANTIVERT) 12.5 MG tablet Take 12.5 mg by mouth 3 (three) times daily as needed for dizziness.    . metFORMIN (GLUCOPHAGE) 500 MG tablet Take 500 mg by mouth in the morning and at bedtime.     . montelukast (SINGULAIR) 10 MG tablet Take 10 mg by mouth at bedtime.    . Multiple Vitamins-Minerals (OCUVITE ADULT 50+ PO) Take 1 tablet by mouth daily.    . nitroGLYCERIN (NITROSTAT) 0.4 MG SL tablet Place 1 tablet (0.4 mg total) under the tongue every 5 (five) minutes as needed for chest pain. Reported on 03/24/2016 25 tablet prn  . pantoprazole (PROTONIX) 40 MG tablet TAKE 1 TABLET BY MOUTH  TWICE DAILY BEFORE A MEAL (Patient taking differently: Take 40 mg by  mouth 2 (two) times daily before a meal.) 180 tablet 3  . Polyethyl Glycol-Propyl Glycol (SYSTANE OP) Apply 1 drop to eye 3 (three) times daily as needed (Dry Eyes).    . potassium chloride SA (K-DUR,KLOR-CON) 20 MEQ tablet Take 1 tablet (20 mEq total) by mouth 2 (two) times daily. 40 tablet 11  . triamcinolone cream (KENALOG) 0.1 % Apply 1 application topically daily as needed (for irritation).     Current Facility-Administered Medications  Medication Dose Route Frequency Provider Last Rate Last Admin  . sodium chloride flush (NS) 0.9 % injection 3 mL  3 mL Intravenous Q12H Imogene Burn, PA-C         Past Medical History:  Diagnosis Date  . Anemia    anemia of chronic disease +/- IDA followed by Dr. Earlie Server. received iron infusions in the past  . Asthma   . Borderline glaucoma   . CAD (coronary artery disease)    a.  s/p CABG 1996. b. low risk nuc 2011. c. LHC 04/2016 due to drop in EF -> occluded native LAD,  widely patent sequential LIMA-D1-LAD.  Marland Kitchen Chronic kidney disease    "mild stage of kidney disease"  . Chronic systolic CHF (congestive heart failure) ([place])   . Complication of anesthesia   . DM2 (diabetes mellitus, type 2) ([place])    not on any medicine for this at this time, 05/2016  . GERD (gastroesophageal reflux disease)   . Gout   . History of hiatal hernia    in 60s  . History of pneumonia    March, 2016  . HLD (hyperlipidemia)   . HTN (hypertension)   . Hyperthyroidism 01/21/2016  . Inappropriate sinus tachycardia   . LBBB (left bundle branch block)    a. Seen in 04/2016  . Macular degeneration    left  . Myocardial infarction ([place])    1996  . Neuropathy   . NICM (nonischemic cardiomyopathy) ([place])    a. Dx 04/2016 - EF 25-30%, diffuse HK, elevated LVEDP, mild MR, mod LAE.  Marland Kitchen NSVT (nonsustained ventricular tachycardia) ([place])   . PAT (paroxysmal atrial tachycardia) ([place])   . PONV (postoperative nausea and vomiting)    "after just about every surgery I've had"  . Rheumatoid arthritis (HCC)    RA  . Seasonal allergies     ROS:   All systems reviewed and negative except as noted in the HPI.   Past Surgical History:  Procedure Laterality Date  . ABDOMINAL HYSTERECTOMY    . APPENDECTOMY    . BACK SURGERY    . BIOPSY  04/27/2020   Procedure: BIOPSY;  Surgeon: Daneil Dolin, MD;  [facility];  Service: Endoscopy;;  ascending colon biopsy  . CARDIAC CATHETERIZATION N/A 05/01/2016   Procedure: Right/Left Heart Cath and Coronary/Graft Angiography;  Surgeon: Leonie Man, MD;  Location: [place] [facility];  Service: Cardiovascular;  Laterality: N/A;  . CHOLECYSTECTOMY    . COLONOSCOPY  11/2011   Dr. Magod:ext/int hemorrhoids, diverticulosis sigmoid colon and distal desc colon, mid-desc colon hyperplastic polyps.   . COLONOSCOPY WITH PROPOFOL N/A 04/27/2020   Rourk:  Diverticulosis, hemorrhoids, normal terminal ileum.  Random colon biopsies significant for chronic lymphocytic colitis  . CORONARY ARTERY BYPASS GRAFT  1996   2 vessels  . ESOPHAGOGASTRODUODENOSCOPY  11/2011   Dr. Watt Climes: small hiatal hernia, one non-bleeding superficial gastric ulcer, medium-sized diverticulum in area of papilla  . ESOPHAGOGASTRODUODENOSCOPY N/A 12/29/2015   Dr. Gala Romney: Chronic inactive gastritis,  normal esophagus status post dilation, duodenal diverticula  . ESOPHAGOGASTRODUODENOSCOPY (EGD) WITH PROPOFOL N/A 07/24/2019   Dr. Gala Romney: Normal esophagus status post empiric dilation, small hiatal hernia, large D2 diverticulum  . EYE SURGERY Bilateral    cataract removal  . FOOT SURGERY     removal bone spur  . FRACTURE SURGERY Right    arm  . GIVENS CAPSULE STUDY  11/2012   Dr. Watt Climes: minimal gastritis, normal small bowel capsule  . HERNIA REPAIR     hiatal hernia  . MALONEY DILATION N/A 07/24/2019   Procedure: Venia Minks DILATION;  Surgeon: Daneil Dolin, MD;  [facility];  Service: Endoscopy;  Laterality: N/A;  . MAXIMUM ACCESS (MAS)POSTERIOR LUMBAR INTERBODY FUSION (PLIF) 1 LEVEL N/A 08/14/2013   Procedure:  MAXIMUM ACCESS SURGERY(MAS) POSTERIOR LUMBAR INTERBODY FUSION LUMBAR THREE-FOUR ;  Surgeon: Eustace Moore, MD;  Location: [place] [facility];  Service: Neurosurgery;  Laterality: N/A;   MAXIMUM ACCESS SURGERY(MAS) POSTERIOR LUMBAR INTERBODY FUSION LUMBAR THREE-FOUR   . PTCA    . RIGHT/LEFT HEART CATH AND CORONARY ANGIOGRAPHY N/A 03/26/2020   Procedure: RIGHT/LEFT HEART CATH AND CORONARY ANGIOGRAPHY;  Surgeon: Leonie Man, MD;  Location: [place] [facility];  Service: Cardiovascular;  Laterality: N/A;  . SPINAL CORD STIMULATOR INSERTION N/A 06/16/2016   Procedure: LUMBAR SPINAL CORD STIMULATOR INSERTION;  Surgeon: Clydell Hakim, MD;  Location: [place] [facility];  Service: Neurosurgery;  Laterality: N/A;  LUMBAR SPINAL CORD STIMULATOR INSERTION  . tens  05/2016  . TONSILLECTOMY        Family History  Problem Relation Age of Onset  . Heart attack Father   . Heart attack Mother   . Bladder Cancer Brother   . Cervical cancer Daughter        ?  . Colon cancer Neg Hx      Social History   Socioeconomic History  . Marital status: Married    Spouse name: Not on file  . Number of children: 3  . Years of education: Not on file  . Highest education level: Not on file  Occupational History  . Not on file  Tobacco Use  . Smoking status: Former Smoker    Packs/day: 0.75    Years: 15.00    Pack years: 11.25    Types: Cigarettes    Quit date: 11/20/1994    Years since quitting: 26.1  . Smokeless tobacco: Never Used  . Tobacco comment: Quit in 1996  Vaping Use  . Vaping Use: Never used  Substance and Sexual Activity  . Alcohol use: No    Alcohol/week: 0.0 standard drinks  . Drug use: No  . Sexual activity: Yes  Other Topics Concern  . Not on file  Social History Narrative   2 living children, one deceased age 35   Social Determinants of Health   Financial Resource Strain: Not on file  Food Insecurity: Not on file  Transportation Needs: Not on file  Physical Activity: Not on file  Stress: Not on file  Social Connections: Not on file  Intimate Partner Violence: Not on file     BP 122/68   Pulse 88   Ht 5' 2.5" (1.588 m)   Wt 147 lb 12.8 oz (67 kg)   SpO2 97%   BMI 26.60 kg/m   Physical Exam:  Well appearing NAD HEENT: Unremarkable Neck:  No JVD, no thyromegally Lymphatics:  No adenopathy Back:  No CVA tenderness Lungs:  Clear HEART:  Regular rate rhythm, no murmurs,  no rubs, no clicks Abd:  soft, positive bowel sounds, no organomegally, no rebound, no guarding Ext:  2 plus pulses, no edema, no cyanosis, no clubbing Skin:  No rashes no nodules Neuro:  CN II through XII intact, motor grossly intact  Assess/Plan: 1. Chronic systolic heart failure - her symptoms are class 2 and her EF is 30% despite maximal medical therapy. I have  discussed the indications/risks/benefits/goals/expectations and she will call us if she wishes to proceed. 2. Atrial tachy - she denies palpitations. Continue her coreg.  Carleene Overlie Jakita Dutkiewicz,MD

## 2021-01-18 NOTE — Addendum Note (Signed)
Addended by: Levonne Hubert on: 01/18/2021 11:28 AM   Modules accepted: Orders

## 2021-01-18 NOTE — Progress Notes (Signed)
HPI Katherine Larson returns today for followup. She is a pleasant 75 yo woman with a h/o HFrEF, on maximal medical therapy, who I saw almost 4 years ago for possible ICD insertion. Her EF has improved and we held off. She developed worsening dyspnea about a year ago and her meds have been adjusted but her EF remains down. She has not had syncope. She is pending a spinal stimulator change out.   Current Outpatient Medications  Medication Sig Dispense Refill  . albuterol (PROVENTIL HFA;VENTOLIN HFA) 108 (90 Base) MCG/ACT inhaler Take 2 puffs 3 times a day and every 4 hours as needed. (Patient taking differently: Inhale 2 puffs into the lungs every 4 (four) hours as needed for wheezing or shortness of breath.)    . allopurinol (ZYLOPRIM) 300 MG tablet Take 300 mg by mouth daily.    Marland Kitchen aspirin EC 81 MG EC tablet Take 1 tablet (81 mg total) by mouth daily.    Marland Kitchen atorvastatin (LIPITOR) 80 MG tablet Take 80 mg by mouth daily.    . budesonide (ENTOCORT EC) 3 MG 24 hr capsule Take 3 mg by mouth daily. 90 capsule 1  . calcium carbonate (TUMS - DOSED IN MG ELEMENTAL CALCIUM) 500 MG chewable tablet Chew 1 tablet by mouth as needed for indigestion or heartburn.    . Carboxymethylcellulose Sodium (THERATEARS OP) Place 1 drop into both eyes 3 (three) times daily as needed (dry eyes).    . carvedilol (COREG) 6.25 MG tablet Take 6.25 mg by mouth 2 (two) times daily with a meal.    . cholecalciferol (VITAMIN D3) 25 MCG (1000 UT) tablet Take 1,000 Units by mouth daily.    . fexofenadine (ALLEGRA) 180 MG tablet Take 180 mg by mouth daily.    . fluticasone (FLONASE) 50 MCG/ACT nasal spray Place 2 sprays into both nostrils daily as needed for allergies.    . furosemide (LASIX) 40 MG tablet Take 40 mg by mouth 2 (two) times daily.     Marland Kitchen gabapentin (NEURONTIN) 300 MG capsule Take 300 mg by mouth 3 (three) times daily.     Marland Kitchen HYDROcodone-acetaminophen (NORCO) 10-325 MG tablet Take 1 tablet by mouth every 6 (six) hours  as needed for moderate pain.    . iron polysaccharides (NIFEREX) 150 MG capsule Take 150 mg by mouth daily.    . Lancets (ONETOUCH DELICA PLUS [id number]) [place]     . leflunomide (ARAVA) 20 MG tablet Take 20 mg by mouth daily.    Marland Kitchen losartan (COZAAR) 100 MG tablet Take 1 tablet (100 mg total) by mouth daily. 90 tablet 3  . MAGNESIUM-OXIDE 400 (241.3 Mg) MG tablet TAKE 1 TABLET(400 MG) BY MOUTH DAILY 30 tablet 6  . meclizine (ANTIVERT) 12.5 MG tablet Take 12.5 mg by mouth 3 (three) times daily as needed for dizziness.    . metFORMIN (GLUCOPHAGE) 500 MG tablet Take 500 mg by mouth in the morning and at bedtime.     . montelukast (SINGULAIR) 10 MG tablet Take 10 mg by mouth at bedtime.    . Multiple Vitamins-Minerals (OCUVITE ADULT 50+ PO) Take 1 tablet by mouth daily.    . nitroGLYCERIN (NITROSTAT) 0.4 MG SL tablet Place 1 tablet (0.4 mg total) under the tongue every 5 (five) minutes as needed for chest pain. Reported on 03/24/2016 25 tablet prn  . pantoprazole (PROTONIX) 40 MG tablet TAKE 1 TABLET BY MOUTH  TWICE DAILY BEFORE A MEAL (Patient taking differently: Take 40 mg by  mouth 2 (two) times daily before a meal.) 180 tablet 3  . Polyethyl Glycol-Propyl Glycol (SYSTANE OP) Apply 1 drop to eye 3 (three) times daily as needed (Dry Eyes).    . potassium chloride SA (K-DUR,KLOR-CON) 20 MEQ tablet Take 1 tablet (20 mEq total) by mouth 2 (two) times daily. 40 tablet 11  . triamcinolone cream (KENALOG) 0.1 % Apply 1 application topically daily as needed (for irritation).     Current Facility-Administered Medications  Medication Dose Route Frequency Provider Last Rate Last Admin  . sodium chloride flush (NS) 0.9 % injection 3 mL  3 mL Intravenous Q12H Imogene Burn, PA-C         Past Medical History:  Diagnosis Date  . Anemia    anemia of chronic disease +/- IDA followed by Dr. Earlie Server. received iron infusions in the past  . Asthma   . Borderline glaucoma   . CAD (coronary artery disease)    a.  s/p CABG 1996. b. low risk nuc 2011. c. LHC 04/2016 due to drop in EF -> occluded native LAD,  widely patent sequential LIMA-D1-LAD.  Marland Kitchen Chronic kidney disease    "mild stage of kidney disease"  . Chronic systolic CHF (congestive heart failure) ([place])   . Complication of anesthesia   . DM2 (diabetes mellitus, type 2) ([place])    not on any medicine for this at this time, 05/2016  . GERD (gastroesophageal reflux disease)   . Gout   . History of hiatal hernia    in 63s  . History of pneumonia    March, 2016  . HLD (hyperlipidemia)   . HTN (hypertension)   . Hyperthyroidism 01/21/2016  . Inappropriate sinus tachycardia   . LBBB (left bundle branch block)    a. Seen in 04/2016  . Macular degeneration    left  . Myocardial infarction ([place])    1996  . Neuropathy   . NICM (nonischemic cardiomyopathy) ([place])    a. Dx 04/2016 - EF 25-30%, diffuse HK, elevated LVEDP, mild MR, mod LAE.  Marland Kitchen NSVT (nonsustained ventricular tachycardia) ([place])   . PAT (paroxysmal atrial tachycardia) ([place])   . PONV (postoperative nausea and vomiting)    "after just about every surgery I've had"  . Rheumatoid arthritis (HCC)    RA  . Seasonal allergies     ROS:   All systems reviewed and negative except as noted in the HPI.   Past Surgical History:  Procedure Laterality Date  . ABDOMINAL HYSTERECTOMY    . APPENDECTOMY    . BACK SURGERY    . BIOPSY  04/27/2020   Procedure: BIOPSY;  Surgeon: Daneil Dolin, MD;  [facility];  Service: Endoscopy;;  ascending colon biopsy  . CARDIAC CATHETERIZATION N/A 05/01/2016   Procedure: Right/Left Heart Cath and Coronary/Graft Angiography;  Surgeon: Leonie Man, MD;  Location: [place] [facility];  Service: Cardiovascular;  Laterality: N/A;  . CHOLECYSTECTOMY    . COLONOSCOPY  11/2011   Dr. Magod:ext/int hemorrhoids, diverticulosis sigmoid colon and distal desc colon, mid-desc colon hyperplastic polyps.   . COLONOSCOPY WITH PROPOFOL N/A 04/27/2020   Rourk:  Diverticulosis, hemorrhoids, normal terminal ileum.  Random colon biopsies significant for chronic lymphocytic colitis  . CORONARY ARTERY BYPASS GRAFT  1996   2 vessels  . ESOPHAGOGASTRODUODENOSCOPY  11/2011   Dr. Watt Climes: small hiatal hernia, one non-bleeding superficial gastric ulcer, medium-sized diverticulum in area of papilla  . ESOPHAGOGASTRODUODENOSCOPY N/A 12/29/2015   Dr. Gala Romney: Chronic inactive gastritis,  normal esophagus status post dilation, duodenal diverticula  . ESOPHAGOGASTRODUODENOSCOPY (EGD) WITH PROPOFOL N/A 07/24/2019   Dr. Gala Romney: Normal esophagus status post empiric dilation, small hiatal hernia, large D2 diverticulum  . EYE SURGERY Bilateral    cataract removal  . FOOT SURGERY     removal bone spur  . FRACTURE SURGERY Right    arm  . GIVENS CAPSULE STUDY  11/2012   Dr. Watt Climes: minimal gastritis, normal small bowel capsule  . HERNIA REPAIR     hiatal hernia  . MALONEY DILATION N/A 07/24/2019   Procedure: Venia Minks DILATION;  Surgeon: Daneil Dolin, MD;  [facility];  Service: Endoscopy;  Laterality: N/A;  . MAXIMUM ACCESS (MAS)POSTERIOR LUMBAR INTERBODY FUSION (PLIF) 1 LEVEL N/A 08/14/2013   Procedure:  MAXIMUM ACCESS SURGERY(MAS) POSTERIOR LUMBAR INTERBODY FUSION LUMBAR THREE-FOUR ;  Surgeon: Eustace Moore, MD;  Location: [place] [facility];  Service: Neurosurgery;  Laterality: N/A;   MAXIMUM ACCESS SURGERY(MAS) POSTERIOR LUMBAR INTERBODY FUSION LUMBAR THREE-FOUR   . PTCA    . RIGHT/LEFT HEART CATH AND CORONARY ANGIOGRAPHY N/A 03/26/2020   Procedure: RIGHT/LEFT HEART CATH AND CORONARY ANGIOGRAPHY;  Surgeon: Leonie Man, MD;  Location: [place] [facility];  Service: Cardiovascular;  Laterality: N/A;  . SPINAL CORD STIMULATOR INSERTION N/A 06/16/2016   Procedure: LUMBAR SPINAL CORD STIMULATOR INSERTION;  Surgeon: Clydell Hakim, MD;  Location: [facility];  Service: Neurosurgery;  Laterality: N/A;  LUMBAR SPINAL CORD STIMULATOR INSERTION  . tens  05/2016  . TONSILLECTOMY        Family History  Problem Relation Age of Onset  . Heart attack Father   . Heart attack Mother   . Bladder Cancer Brother   . Cervical cancer Daughter        ?  . Colon cancer Neg Hx      Social History   Socioeconomic History  . Marital status: Married    Spouse name: Not on file  . Number of children: 3  . Years of education: Not on file  . Highest education level: Not on file  Occupational History  . Not on file  Tobacco Use  . Smoking status: Former Smoker    Packs/day: 0.75    Years: 15.00    Pack years: 11.25    Types: Cigarettes    Quit date: 11/20/1994    Years since quitting: 26.1  . Smokeless tobacco: Never Used  . Tobacco comment: Quit in 1996  Vaping Use  . Vaping Use: Never used  Substance and Sexual Activity  . Alcohol use: No    Alcohol/week: 0.0 standard drinks  . Drug use: No  . Sexual activity: Yes  Other Topics Concern  . Not on file  Social History Narrative   2 living children, one deceased age 49   Social Determinants of Health   Financial Resource Strain: Not on file  Food Insecurity: Not on file  Transportation Needs: Not on file  Physical Activity: Not on file  Stress: Not on file  Social Connections: Not on file  Intimate Partner Violence: Not on file     BP 122/68   Pulse 88   Ht 5' 2.5" (1.588 m)   Wt 147 lb 12.8 oz (67 kg)   SpO2 97%   BMI 26.60 kg/m   Physical Exam:  Well appearing NAD HEENT: Unremarkable Neck:  No JVD, no thyromegally Lymphatics:  No adenopathy Back:  No CVA tenderness Lungs:  Clear HEART:  Regular rate rhythm, no murmurs,  no rubs, no clicks Abd:  soft, positive bowel sounds, no organomegally, no rebound, no guarding Ext:  2 plus pulses, no edema, no cyanosis, no clubbing Skin:  No rashes no nodules Neuro:  CN II through XII intact, motor grossly intact  Assess/Plan: 1. Chronic systolic heart failure - her symptoms are class 2 and her EF is 30% despite maximal medical therapy. I have  discussed the indications/risks/benefits/goals/expectations and she will call us if she wishes to proceed. 2. Atrial tachy - she denies palpitations. Continue her coreg.  Katherine Overlie Jesly Hartmann,MD

## 2021-01-18 NOTE — Progress Notes (Signed)
Anesthesia Chart Review: SAME DAY WORK-UP   Case: [id number] Date/Time: 01/19/21 1350   Procedure: Spinal cord stimulator battery change (N/A )   Anesthesia type: Monitor Anesthesia Care   Pre-op diagnosis: battery end of life of spinal cord stimulator   Location: [facility] OR ROOM 41 / [facility]   Surgeons: Reece Agar, MD      DISCUSSION: Patient is a 75 year old female scheduled for the above procedure.   History includes former smoker (quit 11/20/94), post-operative N/V, CAD (MI, s/p CABG 1996, LIMA-LAD), nonischemic cardiomyopathy (diagnoed 04/2016; 05/01/16 cath: patent LIMA-LAD, EF 33-29%), chronic systolic and diastolic CHF, LBBB, dysrhythmia (NSVT, PAT, inappropriate ST), HTN, DM2, CKD, HLD, hyperthyroidism (2017, treated with methimazole), asthma, RA, neuropathy, GERD, hiatal hernia, anemia, glaucoma (borderline), back surgery (S/p L3-4 PLIF 08/14/13; lumbar spinal cord stimulator implant 06/16/16).  Primary cardiologist is Dr. [name] Cancel. She also see EP cardiologist Dr. [name] who has intermittently seen her since 2014. She was seen by Dr. [name] in 2017 following new diagnosis of LV dysfunction (EF 25-30%) in 04/2016. Her EF finally improved to 45-50% in 01/2017 after consistently being on maximal drug therapy, so ICD was not recommended at that time. She primarily saw Dr. [name] Cancel afterwards, but was re-referred after 09/21/20 echo showed EF down to 30-35%. (03/2020 cath showed 1V CAD with patent LIMA-LAD. EF 35% by stress test.) She was unable to afford Entresto, so he changed her to Cozaar. He attempted to get a cardiac MRI, but unable due to spinal cord stimulator and instead she underwent echo 01/11/21 which did not show any improvement in her EF (30%). Dr. [name] Cancel felt it was okay for her to undergo spinal cord stimulator battery exchange. In the interim, she had her new EP visit with with Dr. [name] on 01/18/21. He notes her pending spinal stimulator change battery change procedure. No palpitations. Since her EF  remained under 35%, he discussed indications for ICD. She was to call if she wished to proceed--now has that procedure scheduled for 02/04/21 (with COVID-19 test scheduled 02/02/21).  Preoperative cardiology input [for spinal cord stimulator battery change] outlined on 12/24/20 by Roby Lofts, PA-C: "Per Dr. [name] Cancel, [name] would be at acceptable risk for the planned procedure without further cardiovascular testing.   Patient can hold aspirin 5-7 days prior to her upcoming procedure with plans to restart as soon as she is cleared to do so by her surgeon."  2nd Moderna COVID-19 vaccine 01/16/20. 01/17/21 presurgical COVID-19 test negative. She had CBC and BMET done on 01/18/21. Anesthesia team to evaluate on the day of procedure.     VS:   BP Readings from Last 3 Encounters:  01/18/21 122/68  11/02/20 118/66  10/12/20 136/70   Pulse Readings from Last 3 Encounters:  01/18/21 88  11/02/20 81  10/12/20 82    PROVIDERS: Harlan Stains, MD is PCP Jenkins Rouge, MD is cardiologist Cristopher Peru, MD is EP cardiologist    LABS: Most recent lab results include: Lab Results  Component Value Date   WBC 12.9 (H) 01/18/2021   HGB 11.1 (L) 01/18/2021   HCT 35.6 (L) 01/18/2021   PLT 223 01/18/2021   GLUCOSE 125 (H) 01/18/2021   NA 139 01/18/2021   K 4.4 01/18/2021   CL 100 01/18/2021   CREATININE 0.82 01/18/2021   BUN 21 01/18/2021   CO2 28 01/18/2021  A1c 6.1% and TSH 1.20 07/21/20 ([place]).    OTHER: Colonoscopy 04/27/20: IMPRESSION: - Non-bleeding internal hemorrhoids. - Diverticulosis  in the sigmoid colon and in the descending colon. Status post segmental biopsy. - The examination was otherwise normal on direct and retroflexion views. Normal-appearing terminal ileum.   IMAGES: CT Chest 03/18/20: IMPRESSION: 1. Area of concern in the RIGHT middle lobe measures 6 mm as compared to 5 mm in 2017 (image 110, series 4) and appears slightly better defined, nodule  stable since very recent comparison. This areas better defined on the current study than the study of 2013. Based on very subtle change over 3 year. Would consider the following. Would assess for stability on CT of the chest at 6-12 months. 2. Small hiatal hernia and mild circumferential distal esophageal thickening, nonspecific but can be seen with esophagitis. 3. Three-vessel coronary artery disease status post sternotomy for coronary revascularization. 4. Aortic atherosclerosis.  CT Abd/pelvis 02/18/20: IMPRESSION: - No evidence of bowel obstruction. Prior appendectomy. Extensive left colonic diverticulosis, without evidence of diverticulitis. - Status post cholecystectomy and hysterectomy. - No CT findings to account for the patient's abdominal pain/diarrhea. - 6 mm nodule in the right middle lobe, new. Consider dedicated CT chest for further evaluation.   EKG: 03/26/20: Normal sinus rhythm Rightward axis Non-specific intra-ventricular conduction block Abnormal ECG Confirmed by Thompson Grayer ([id number]) on 03/26/2020 10:21:50 PM   CV: Echo 12/22/20: IMPRESSIONS  1. Left ventricular ejection fraction, by estimation, is 30%. The left  ventricle has moderate to severely decreased function. The left ventricle  demonstrates regional wall motion abnormalities with anteroseptal,  anterior, and apical severe hypokinesis.  There is septal-lateral dyssynchrony consistent with LBBB. Left  ventricular diastolic parameters are consistent with Grade I diastolic  dysfunction (impaired relaxation).  2. Right ventricular systolic function is normal. The right ventricular  size is normal. Tricuspid regurgitation signal is inadequate for assessing  PA pressure.  3. The mitral valve is normal in structure. Trivial mitral valve  regurgitation. No evidence of mitral stenosis.  4. The aortic valve is tricuspid. Aortic valve regurgitation is trivial.  Mild aortic valve sclerosis is present, with  no evidence of aortic valve  stenosis.  5. The inferior vena cava is normal in size with greater than 50%  respiratory variability, suggesting right atrial pressure of 3 mmHg.  (Comparison: LVEF 30-35% 09/21/20; EF 35-40% 03/17/20; EF 45-50% 01/19/17; EF 35% 07/21/16 with severe hypokinesis in the inferior, mid/distal posterior, anteroseptal, distal inferoseptal, and apical walls; EF 25-30% diffuse hypokinesis 04/30/16; EF 55-60% 07/23/13)   RHC/LHC 03/26/20 Ellyn Hack, Shanon Brow, MD; done following abnormal stress test 03/17/20: prior anterior MI with moderate peri-infarct ischemia, EF 35%):  LV END DIASTOLIC PRESSURE and PCWP is normal.  There is no aortic valve stenosis.  Dist LM to Mid LAD lesion is 70% stenosed with 30% stenosed side branch in 1st Sept -> leading up to the stent  Previously placed bare-metal stent in the mid LAD is 100% stenosed.  LIMA-LAD graft was visualized by angiography and is normal in caliber. The graft exhibits no disease. The flow is not reversed. There is no competitive flow   SUMMARY  Severe single-vessel disease with occluded LAD prior to bifurcation into the diagonal branch.  Otherwise widely patent native LCx, RCA and diagonal-LAD that are grafted. ? I suspect that the drop in EF is probably related to a nonischemic etiology or for microvascular ischemia.   ? Nuclear stress test does show mild basal anterior and anteroseptal reversibility in the setting of a large anteroseptal infarct.  I suspect that this is probably due to reduced flow at the site of  the occluded LAD stent with minimal upstream septal perforators.  Normal RIGHT HEART CATH pressures with low LVEDP and PCWP indicating mild dehydration -> indicates adequate diuresis  Relatively normal cardiac output and index of 5.41 and 3.24 respectively.   Cardiac event monitor 05/17/16-06/15/16: Study Highlights: NSR No significant arrhythmia   Past Medical History:  Diagnosis Date  . Anemia    anemia of  chronic disease +/- IDA followed by Dr. Earlie Server. received iron infusions in the past  . Asthma   . Borderline glaucoma   . CAD (coronary artery disease)    a. s/p CABG 1996. b. low risk nuc 2011. c. LHC 04/2016 due to drop in EF -> occluded native LAD,  widely patent sequential LIMA-D1-LAD.  Marland Kitchen Chronic kidney disease    "mild stage of kidney disease"  . Chronic systolic CHF (congestive heart failure) ([place])   . Complication of anesthesia   . DM2 (diabetes mellitus, type 2) ([place])    not on any medicine for this at this time, 05/2016  . GERD (gastroesophageal reflux disease)   . Gout   . History of hiatal hernia    in 34s  . History of pneumonia    March, 2016  . HLD (hyperlipidemia)   . HTN (hypertension)   . Hyperthyroidism 01/21/2016  . Inappropriate sinus tachycardia   . LBBB (left bundle branch block)    a. Seen in 04/2016  . Macular degeneration    left  . Myocardial infarction ([place])    1996  . Neuropathy   . NICM (nonischemic cardiomyopathy) ([place])    a. Dx 04/2016 - EF 25-30%, diffuse HK, elevated LVEDP, mild MR, mod LAE.  Marland Kitchen NSVT (nonsustained ventricular tachycardia) ([place])   . PAT (paroxysmal atrial tachycardia) ([place])   . PONV (postoperative nausea and vomiting)    "after just about every surgery I've had"  . Rheumatoid arthritis (HCC)    RA  . Seasonal allergies     Past Surgical History:  Procedure Laterality Date  . ABDOMINAL HYSTERECTOMY    . APPENDECTOMY    . BACK SURGERY    . BIOPSY  04/27/2020   Procedure: BIOPSY;  Surgeon: Daneil Dolin, MD;  [facility];  Service: Endoscopy;;  ascending colon biopsy  . CARDIAC CATHETERIZATION N/A 05/01/2016   Procedure: Right/Left Heart Cath and Coronary/Graft Angiography;  Surgeon: Leonie Man, MD;  Location: [facility] CV LAB;  Service: Cardiovascular;  Laterality: N/A;  . CHOLECYSTECTOMY    . COLONOSCOPY  11/2011   Dr. Magod:ext/int hemorrhoids, diverticulosis sigmoid colon and distal desc colon, mid-desc  colon hyperplastic polyps.   . COLONOSCOPY WITH PROPOFOL N/A 04/27/2020   Rourk: Diverticulosis, hemorrhoids, normal terminal ileum.  Random colon biopsies significant for chronic lymphocytic colitis  . CORONARY ARTERY BYPASS GRAFT  1996   2 vessels  . ESOPHAGOGASTRODUODENOSCOPY  11/2011   Dr. Watt Climes: small hiatal hernia, one non-bleeding superficial gastric ulcer, medium-sized diverticulum in area of papilla  . ESOPHAGOGASTRODUODENOSCOPY N/A 12/29/2015   Dr. Gala Romney: Chronic inactive gastritis, normal esophagus status post dilation, duodenal diverticula  . ESOPHAGOGASTRODUODENOSCOPY (EGD) WITH PROPOFOL N/A 07/24/2019   Dr. Gala Romney: Normal esophagus status post empiric dilation, small hiatal hernia, large D2 diverticulum  . EYE SURGERY Bilateral    cataract removal  . FOOT SURGERY     removal bone spur  . FRACTURE SURGERY Right    arm  . GIVENS CAPSULE STUDY  11/2012   Dr. Watt Climes: minimal gastritis, normal small bowel capsule  . HERNIA  REPAIR     hiatal hernia  . MALONEY DILATION N/A 07/24/2019   Procedure: Venia Minks DILATION;  Surgeon: Daneil Dolin, MD;  [facility];  Service: Endoscopy;  Laterality: N/A;  . MAXIMUM ACCESS (MAS)POSTERIOR LUMBAR INTERBODY FUSION (PLIF) 1 LEVEL N/A 08/14/2013   Procedure:  MAXIMUM ACCESS SURGERY(MAS) POSTERIOR LUMBAR INTERBODY FUSION LUMBAR THREE-FOUR ;  Surgeon: Eustace Moore, MD;  Location: [facility] NEURO ORS;  Service: Neurosurgery;  Laterality: N/A;   MAXIMUM ACCESS SURGERY(MAS) POSTERIOR LUMBAR INTERBODY FUSION LUMBAR THREE-FOUR   . PTCA    . RIGHT/LEFT HEART CATH AND CORONARY ANGIOGRAPHY N/A 03/26/2020   Procedure: RIGHT/LEFT HEART CATH AND CORONARY ANGIOGRAPHY;  Surgeon: Leonie Man, MD;  Location: [facility] CV LAB;  Service: Cardiovascular;  Laterality: N/A;  . SPINAL CORD STIMULATOR INSERTION N/A 06/16/2016   Procedure: LUMBAR SPINAL CORD STIMULATOR INSERTION;  Surgeon: Clydell Hakim, MD;  Location: [facility] NEURO ORS;  Service: Neurosurgery;  Laterality:  N/A;  LUMBAR SPINAL CORD STIMULATOR INSERTION  . tens  05/2016  . TONSILLECTOMY      MEDICATIONS: . sodium chloride flush (NS) 0.9 % injection 3 mL   . albuterol (PROVENTIL HFA;VENTOLIN HFA) 108 (90 Base) MCG/ACT inhaler  . allopurinol (ZYLOPRIM) 300 MG tablet  . atorvastatin (LIPITOR) 80 MG tablet  . budesonide (ENTOCORT EC) 3 MG 24 hr capsule  . Carboxymethylcellulose Sodium (THERATEARS OP)  . carvedilol (COREG) 6.25 MG tablet  . fexofenadine (ALLEGRA) 180 MG tablet  . furosemide (LASIX) 40 MG tablet  . gabapentin (NEURONTIN) 300 MG capsule  . HYDROcodone-acetaminophen (NORCO) 10-325 MG tablet  . leflunomide (ARAVA) 20 MG tablet  . losartan (COZAAR) 100 MG tablet  . meclizine (ANTIVERT) 12.5 MG tablet  . metFORMIN (GLUCOPHAGE) 500 MG tablet  . montelukast (SINGULAIR) 10 MG tablet  . Multiple Vitamins-Minerals (OCUVITE ADULT 50+ PO)  . pantoprazole (PROTONIX) 40 MG tablet  . potassium chloride SA (K-DUR,KLOR-CON) 20 MEQ tablet  . aspirin EC 81 MG EC tablet  . calcium carbonate (TUMS - DOSED IN MG ELEMENTAL CALCIUM) 500 MG chewable tablet  . cholecalciferol (VITAMIN D3) 25 MCG (1000 UT) tablet  . fluticasone (FLONASE) 50 MCG/ACT nasal spray  . iron polysaccharides (NIFEREX) 150 MG capsule  . Lancets (ONETOUCH DELICA PLUS [id number]) MISC  . MAGNESIUM-OXIDE 400 (241.3 Mg) MG tablet  . nitroGLYCERIN (NITROSTAT) 0.4 MG SL tablet  . Polyethyl Glycol-Propyl Glycol (SYSTANE OP)  . triamcinolone cream (KENALOG) 0.1 %  Per list, not currently taking calcium carbonate, vitamin D3, Flonase nasal spray, Niferex, Systane ophthalmic, Kenalog.   Myra Gianotti, PA-C Surgical Short Stay/Anesthesiology [facility] Phone [phone] [facility] Phone [phone] 01/18/2021 3:45 PM

## 2021-01-18 NOTE — Progress Notes (Signed)
[name] denies chest pain or shortness of breath. Patient tested negative for Covid on 01/17/21 and has been in quarantine since that time.  [name] has type II diabetes, patient chest a fasting 3 times awake, it averages 120.  Patient does not know what the last A1C was.  I instructed [name] to not take Metformin in am.I instructed patient to check CBG after awaking and every 2 hours until arrival  to the hospital.  I Instructed patient if CBG is less than 70 to drink  1/2 cup of a clear juice. Recheck CBG in 15 minutes then call pre- op desk at [phone] for further instructions. If scheduled to receive Insulin, do not take Insulin.

## 2021-01-19 ENCOUNTER — Ambulatory Visit (HOSPITAL_COMMUNITY): Payer: Medicare Other | Admitting: Vascular Surgery

## 2021-01-19 ENCOUNTER — Encounter (HOSPITAL_COMMUNITY): Payer: Self-pay | Admitting: Pain Medicine

## 2021-01-19 ENCOUNTER — Encounter (HOSPITAL_COMMUNITY)
Admission: RE | Disposition: A | Discharge status: Home / Self Care | Payer: Self-pay | Source: Home / Self Care | Attending: Pain Medicine

## 2021-01-19 ENCOUNTER — Other Ambulatory Visit: Payer: Self-pay

## 2021-01-19 ENCOUNTER — Ambulatory Visit (HOSPITAL_COMMUNITY)
Admission: RE | Admit: 2021-01-19 | Discharge: 2021-01-19 | Disposition: A | Discharge status: Home / Self Care | Payer: Medicare Other | Attending: Pain Medicine | Admitting: Pain Medicine

## 2021-01-19 DIAGNOSIS — T85193A Other mechanical complication of implanted electronic neurostimulator, generator, initial encounter: Secondary | ICD-10-CM | POA: Diagnosis not present

## 2021-01-19 DIAGNOSIS — I447 Left bundle-branch block, unspecified: Secondary | ICD-10-CM | POA: Diagnosis not present

## 2021-01-19 DIAGNOSIS — I110 Hypertensive heart disease with heart failure: Secondary | ICD-10-CM | POA: Insufficient documentation

## 2021-01-19 DIAGNOSIS — X58XXXA Exposure to other specified factors, initial encounter: Secondary | ICD-10-CM | POA: Diagnosis not present

## 2021-01-19 DIAGNOSIS — Z87891 Personal history of nicotine dependence: Secondary | ICD-10-CM | POA: Diagnosis not present

## 2021-01-19 DIAGNOSIS — Z4542 Encounter for adjustment and management of neuropacemaker (brain) (peripheral nerve) (spinal cord): Secondary | ICD-10-CM | POA: Diagnosis not present

## 2021-01-19 DIAGNOSIS — E118 Type 2 diabetes mellitus with unspecified complications: Secondary | ICD-10-CM | POA: Insufficient documentation

## 2021-01-19 DIAGNOSIS — I5030 Unspecified diastolic (congestive) heart failure: Secondary | ICD-10-CM | POA: Insufficient documentation

## 2021-01-19 DIAGNOSIS — G894 Chronic pain syndrome: Secondary | ICD-10-CM | POA: Diagnosis not present

## 2021-01-19 DIAGNOSIS — Z7984 Long term (current) use of oral hypoglycemic drugs: Secondary | ICD-10-CM | POA: Diagnosis not present

## 2021-01-19 DIAGNOSIS — Z888 Allergy status to other drugs, medicaments and biological substances status: Secondary | ICD-10-CM | POA: Diagnosis not present

## 2021-01-19 DIAGNOSIS — E871 Hypo-osmolality and hyponatremia: Secondary | ICD-10-CM | POA: Diagnosis not present

## 2021-01-19 HISTORY — DX: Dyspnea, unspecified: R06.00

## 2021-01-19 HISTORY — PX: SPINAL CORD STIMULATOR BATTERY EXCHANGE: SHX6202

## 2021-01-19 HISTORY — DX: Personal history of other medical treatment: Z92.89

## 2021-01-19 HISTORY — DX: Pneumonia, unspecified organism: J18.9

## 2021-01-19 LAB — GLUCOSE, CAPILLARY
Glucose-Capillary: 128 mg/dL — ABNORMAL HIGH (ref 70–99)
Glucose-Capillary: 119 mg/dL — ABNORMAL HIGH (ref 70–99)

## 2021-01-19 SURGERY — SPINAL CORD STIMULATOR BATTERY EXCHANGE
Anesthesia: Monitor Anesthesia Care | Site: Back

## 2021-01-19 MED ORDER — MIDAZOLAM HCL 2 MG/2ML IJ SOLN
INTRAMUSCULAR | Status: AC
  Filled 2021-01-19: qty 2

## 2021-01-19 MED ORDER — VANCOMYCIN HCL IN DEXTROSE 1-5 GM/200ML-% IV SOLN: 1000.0000 mg | INTRAVENOUS | Status: AC

## 2021-01-19 MED ORDER — FENTANYL CITRATE (PF) 250 MCG/5ML IJ SOLN
INTRAMUSCULAR | Status: AC
  Filled 2021-01-19: qty 5

## 2021-01-19 MED ORDER — PROPOFOL 10 MG/ML IV BOLUS
INTRAVENOUS | Status: AC
  Filled 2021-01-19: qty 20

## 2021-01-19 MED ORDER — ACETAMINOPHEN 10 MG/ML IV SOLN
1000.0000 mg | Freq: Once | INTRAVENOUS | Status: DC | PRN
Start: 2021-01-19 — End: 2021-01-19

## 2021-01-19 MED ORDER — PHENYLEPHRINE 40 MCG/ML (10ML) SYRINGE FOR IV PUSH (FOR BLOOD PRESSURE SUPPORT)
PREFILLED_SYRINGE | INTRAVENOUS | Status: AC
  Filled 2021-01-19: qty 10

## 2021-01-19 MED ORDER — ONDANSETRON HCL 4 MG/2ML IJ SOLN
INTRAMUSCULAR | Status: DC | PRN
  Administered 2021-01-19: 4 mg via INTRAVENOUS

## 2021-01-19 MED ORDER — FENTANYL CITRATE (PF) 100 MCG/2ML IJ SOLN
INTRAMUSCULAR | Status: DC | PRN
  Administered 2021-01-19: 50 ug via INTRAVENOUS

## 2021-01-19 MED ORDER — BUPIVACAINE HCL (PF) 0.25 % IJ SOLN
INTRAMUSCULAR | Status: AC
  Filled 2021-01-19: qty 30

## 2021-01-19 MED ORDER — CHLORHEXIDINE GLUCONATE 0.12 % MT SOLN
OROMUCOSAL | Status: AC
  Administered 2021-01-19: 15 mL via OROMUCOSAL
  Filled 2021-01-19: qty 15

## 2021-01-19 MED ORDER — CHLORHEXIDINE GLUCONATE CLOTH 2 % EX PADS: 6.0000 | MEDICATED_PAD | Freq: Once | CUTANEOUS | Status: DC

## 2021-01-19 MED ORDER — LIDOCAINE-EPINEPHRINE 2 %-1:100000 IJ SOLN
INTRAMUSCULAR | Status: AC
  Filled 2021-01-19: qty 1

## 2021-01-19 MED ORDER — PROPOFOL 10 MG/ML IV BOLUS
INTRAVENOUS | Status: DC | PRN
  Administered 2021-01-19: 20 mg via INTRAVENOUS

## 2021-01-19 MED ORDER — ONDANSETRON HCL 4 MG/2ML IJ SOLN: 4.0000 mg | Freq: Once | INTRAMUSCULAR | Status: DC | PRN

## 2021-01-19 MED ORDER — 0.9 % SODIUM CHLORIDE (POUR BTL) OPTIME
TOPICAL | Status: DC | PRN
  Administered 2021-01-19: 1000 mL

## 2021-01-19 MED ORDER — ORAL CARE MOUTH RINSE: 15.0000 mL | Freq: Once | OROMUCOSAL | Status: AC

## 2021-01-19 MED ORDER — LIDOCAINE HCL (CARDIAC) PF 100 MG/5ML IV SOSY
PREFILLED_SYRINGE | INTRAVENOUS | Status: DC | PRN
  Administered 2021-01-19: 50 mg via INTRAVENOUS

## 2021-01-19 MED ORDER — CHLORHEXIDINE GLUCONATE 0.12 % MT SOLN: 15.0000 mL | Freq: Once | OROMUCOSAL | Status: AC

## 2021-01-19 MED ORDER — FENTANYL CITRATE (PF) 100 MCG/2ML IJ SOLN: 25.0000 ug | INTRAMUSCULAR | Status: DC | PRN

## 2021-01-19 MED ORDER — LACTATED RINGERS IV SOLN: INTRAVENOUS | Status: DC

## 2021-01-19 MED ORDER — PROPOFOL 500 MG/50ML IV EMUL
INTRAVENOUS | Status: DC | PRN
  Administered 2021-01-19: 100 ug/kg/min via INTRAVENOUS

## 2021-01-19 MED ORDER — VANCOMYCIN HCL IN DEXTROSE 1-5 GM/200ML-% IV SOLN
INTRAVENOUS | Status: AC
  Administered 2021-01-19: 1000 mg via INTRAVENOUS
  Filled 2021-01-19: qty 200

## 2021-01-19 MED ORDER — LIDOCAINE-EPINEPHRINE 2 %-1:100000 IJ SOLN
INTRAMUSCULAR | Status: DC | PRN
  Administered 2021-01-19: 5 mL

## 2021-01-19 MED ORDER — ONDANSETRON HCL 4 MG/2ML IJ SOLN
INTRAMUSCULAR | Status: AC
  Filled 2021-01-19: qty 2

## 2021-01-19 SURGICAL SUPPLY — 56 items
BAG DECANTER FOR FLEXI CONT (MISCELLANEOUS) ×2 IMPLANT
BENZOIN TINCTURE PRP APPL 2/3 (GAUZE/BANDAGES/DRESSINGS) IMPLANT
BINDER ABDOMINAL 12 ML 46-62 (SOFTGOODS) ×2 IMPLANT
BLADE CLIPPER SURG (BLADE) IMPLANT
CHLORAPREP W/TINT 26 (MISCELLANEOUS) ×2 IMPLANT
CLEANER TIP ELECTROSURG 2X2 (MISCELLANEOUS) ×2 IMPLANT
CLIP VESOCCLUDE SM WIDE 6/CT (CLIP) IMPLANT
CONTROL REMOTE FREELINK ALPHA (NEUROSURGERY SUPPLIES) ×2 IMPLANT
COVER WAND RF STERILE (DRAPES) ×2 IMPLANT
DERMABOND ADVANCED (GAUZE/BANDAGES/DRESSINGS) ×1
DERMABOND ADVANCED .7 DNX12 (GAUZE/BANDAGES/DRESSINGS) ×1 IMPLANT
DRAPE C-ARM 42X72 X-RAY (DRAPES) ×2 IMPLANT
DRAPE C-ARMOR (DRAPES) ×2 IMPLANT
DRAPE LAPAROTOMY 100X72X124 (DRAPES) ×2 IMPLANT
DRAPE SURG 17X23 STRL (DRAPES) ×8 IMPLANT
DRSG AQUACEL AG ADV 3.5X 6 (GAUZE/BANDAGES/DRESSINGS) ×2 IMPLANT
DRSG OPSITE POSTOP 3X4 (GAUZE/BANDAGES/DRESSINGS) IMPLANT
ELECT REM PT RETURN 9FT ADLT (ELECTROSURGICAL) ×2
ELECTRODE REM PT RTRN 9FT ADLT (ELECTROSURGICAL) ×1 IMPLANT
GAUZE 4X4 16PLY RFD (DISPOSABLE) ×2 IMPLANT
GLOVE BIO SURGEON STRL SZ8 (GLOVE) ×2 IMPLANT
GLOVE EXAM NITRILE XL STR (GLOVE) IMPLANT
GLOVE EXAM NITRILE XS STR PU (GLOVE) IMPLANT
GLOVE SRG 8 PF TXTR STRL LF DI (GLOVE) ×1 IMPLANT
GLOVE SURG UNDER POLY LF SZ8 (GLOVE) ×2
GOWN STRL REUS W/ TWL LRG LVL3 (GOWN DISPOSABLE) IMPLANT
GOWN STRL REUS W/ TWL XL LVL3 (GOWN DISPOSABLE) IMPLANT
GOWN STRL REUS W/TWL 2XL LVL3 (GOWN DISPOSABLE) IMPLANT
GOWN STRL REUS W/TWL LRG LVL3 (GOWN DISPOSABLE)
GOWN STRL REUS W/TWL XL LVL3 (GOWN DISPOSABLE)
KIT BASIN OR (CUSTOM PROCEDURE TRAY) ×2 IMPLANT
KIT CHARGING (KITS) ×1
KIT CHARGING PRECISION NEURO (KITS) ×1 IMPLANT
KIT IPG ALPHA WAVEWRITER (Stimulator) ×2 IMPLANT
KIT TURNOVER KIT B (KITS) ×2 IMPLANT
NEEDLE 18GX1X1/2 (RX/OR ONLY) (NEEDLE) IMPLANT
NEEDLE HYPO 25X1 1.5 SAFETY (NEEDLE) ×2 IMPLANT
NS IRRIG 1000ML POUR BTL (IV SOLUTION) ×2 IMPLANT
PACK LAMINECTOMY NEURO (CUSTOM PROCEDURE TRAY) ×2 IMPLANT
PAD ARMBOARD 7.5X6 YLW CONV (MISCELLANEOUS) ×2 IMPLANT
SPONGE LAP 4X18 RFD (DISPOSABLE) ×2 IMPLANT
SPONGE SURGIFOAM ABS GEL SZ50 (HEMOSTASIS) IMPLANT
STAPLER SKIN PROX WIDE 3.9 (STAPLE) ×2 IMPLANT
STRIP CLOSURE SKIN 1/2X4 (GAUZE/BANDAGES/DRESSINGS) IMPLANT
SUT MNCRL AB 4-0 PS2 18 (SUTURE) IMPLANT
SUT SILK 0 (SUTURE) ×2
SUT SILK 0 MO-6 18XCR BRD 8 (SUTURE) ×1 IMPLANT
SUT SILK 0 TIES 10X30 (SUTURE) IMPLANT
SUT SILK 2 0 TIES 10X30 (SUTURE) IMPLANT
SUT VIC AB 2-0 CP2 18 (SUTURE) ×4 IMPLANT
SYR 10ML LL (SYRINGE) IMPLANT
SYR EPIDURAL 5ML GLASS (SYRINGE) ×2 IMPLANT
TOWEL GREEN STERILE (TOWEL DISPOSABLE) ×2 IMPLANT
TOWEL GREEN STERILE FF (TOWEL DISPOSABLE) ×2 IMPLANT
WATER STERILE IRR 1000ML POUR (IV SOLUTION) ×2 IMPLANT
YANKAUER SUCT BULB TIP NO VENT (SUCTIONS) ×2 IMPLANT

## 2021-01-19 NOTE — Transfer of Care (Signed)
Immediate Anesthesia Transfer of Care Note  Patient: Katherine Larson  Procedure(s) Performed: Spinal cord stimulator battery change (N/A Back)  Patient Location: PACU  Anesthesia Type:MAC  Level of Consciousness: awake, alert  and oriented  Airway & Oxygen Therapy: Patient Spontanous Breathing  Post-op Assessment: Report given to RN, Post -op Vital signs reviewed and stable and Patient moving all extremities X 4  Post vital signs: Reviewed and stable  Last Vitals:  Vitals Value Taken Time  BP 124/56 01/19/21 1451  Temp 36.8 C 01/19/21 1451  Pulse 65 01/19/21 1500  Resp 20 01/19/21 1500  SpO2 96 % 01/19/21 1500  Vitals shown include unvalidated device data.  Last Pain:  Vitals:   01/19/21 1451  TempSrc:   PainSc: 0-No pain         Complications: No complications documented.

## 2021-01-19 NOTE — H&P (Signed)
Patient with SCS battery end of life, here for battery exchange.  PMH: reviewed in canopy  FH: [place]  Soc: no tobacco  Meds: reviewed in canopy  Allergies: penicillin, robaxin  ROS: no fevers  PE:   Vitals reviewed in canopy  Alert, appropriate No upper or lower extemity weakness Regular breathing pattern Regular pulse Generator site noted in right gluteal region  A/P Here for SCS battery change due to battery at end of life  1.  Battery change today  2.  Discharge home with 5 day clinic follow up.

## 2021-01-19 NOTE — Anesthesia Postprocedure Evaluation (Signed)
Anesthesia Post Note  Patient: Katherine Larson  Procedure(s) Performed: Spinal cord stimulator battery change (N/A Back)     Patient location during evaluation: PACU Anesthesia Type: MAC Level of consciousness: awake Pain management: pain level controlled Vital Signs Assessment: post-procedure vital signs reviewed and stable Respiratory status: spontaneous breathing, nonlabored ventilation, respiratory function stable and patient connected to nasal cannula oxygen Cardiovascular status: stable and blood pressure returned to baseline Postop Assessment: no apparent nausea or vomiting Anesthetic complications: no   No complications documented.  Last Vitals:  Vitals:   01/19/21 1521 01/19/21 1536  BP: (!) 131/59 (!) 129/58  Pulse: 67 66  Resp: 19 19  Temp:  36.6 C  SpO2: 97% 97%    Last Pain:  Vitals:   01/19/21 1521  TempSrc:   PainSc: 0-No pain                 Clotilde Loth P Rembert Browe

## 2021-01-19 NOTE — Anesthesia Procedure Notes (Signed)
Procedure Name: MAC Date/Time: 01/19/2021 1:55 PM Performed by: Inda Coke, CRNA Pre-anesthesia Checklist: Patient identified, Emergency Drugs available, Suction available, Timeout performed and Patient being monitored Patient Re-evaluated:Patient Re-evaluated prior to induction Oxygen Delivery Method: Nasal cannula Induction Type: IV induction Dental Injury: Teeth and Oropharynx as per pre-operative assessment

## 2021-01-19 NOTE — Op Note (Signed)
Procedures: 1. Spinal cord stimulator battery replacement. 2.  Spinal cord stimulator battery pocket revision.  Pre Operative diagnosis:   Spinal cord stimulator end of battery life Chronic pain syndrome  Post Operative diagnosis:  Same  Surgeon: Davy Pique  Assistant: None  Procedure Note:  Informed consent.  Patient taken to OR.  Placed prone on OR table, all pressure points padded.  Antibiotics administered.  Placed under anesthesia care.  Time out performed.  Sterile prep and drape.  Approximate 2 inch linear region anesthetized with 2% lidocaine with epinephrine.  Incision made.  Battery removed.  New battery connnected.  All parameters appropriate.  Pocket revised to account for increase size of new battery.  Irrigated with saline.  Hemostasis achieved.  Closed with series of interrupted 2-0 vicryls followed by staples.  Sterile dressing applied.  Complications: None  Implant: Wavewriter battery  Estimated blood loss: minimal  Fluids: Per anesthesia record.  Disposition: PACU, then home.  5 day clinic follow up.

## 2021-01-20 ENCOUNTER — Encounter (HOSPITAL_COMMUNITY): Payer: Self-pay | Admitting: Pain Medicine

## 2021-01-27 ENCOUNTER — Other Ambulatory Visit: Payer: Self-pay | Admitting: Family Medicine

## 2021-01-27 DIAGNOSIS — Z79899 Other long term (current) drug therapy: Secondary | ICD-10-CM | POA: Diagnosis not present

## 2021-01-27 DIAGNOSIS — N183 Chronic kidney disease, stage 3 unspecified: Secondary | ICD-10-CM | POA: Diagnosis not present

## 2021-01-27 DIAGNOSIS — R42 Dizziness and giddiness: Secondary | ICD-10-CM

## 2021-01-27 DIAGNOSIS — R202 Paresthesia of skin: Secondary | ICD-10-CM | POA: Diagnosis not present

## 2021-01-27 DIAGNOSIS — E1140 Type 2 diabetes mellitus with diabetic neuropathy, unspecified: Secondary | ICD-10-CM | POA: Diagnosis not present

## 2021-01-27 DIAGNOSIS — I5022 Chronic systolic (congestive) heart failure: Secondary | ICD-10-CM | POA: Diagnosis not present

## 2021-01-27 DIAGNOSIS — E785 Hyperlipidemia, unspecified: Secondary | ICD-10-CM | POA: Diagnosis not present

## 2021-01-27 DIAGNOSIS — E08319 Diabetes mellitus due to underlying condition with unspecified diabetic retinopathy without macular edema: Secondary | ICD-10-CM | POA: Diagnosis not present

## 2021-01-27 DIAGNOSIS — I129 Hypertensive chronic kidney disease with stage 1 through stage 4 chronic kidney disease, or unspecified chronic kidney disease: Secondary | ICD-10-CM | POA: Diagnosis not present

## 2021-01-28 ENCOUNTER — Ambulatory Visit
Admission: RE | Admit: 2021-01-28 | Discharge: 2021-01-28 | Disposition: A | Discharge status: Home / Self Care | Payer: Medicare Other | Source: Ambulatory Visit | Attending: Family Medicine | Admitting: Family Medicine

## 2021-01-28 ENCOUNTER — Other Ambulatory Visit: Payer: Self-pay

## 2021-01-28 DIAGNOSIS — R918 Other nonspecific abnormal finding of lung field: Secondary | ICD-10-CM | POA: Diagnosis not present

## 2021-01-28 DIAGNOSIS — R911 Solitary pulmonary nodule: Secondary | ICD-10-CM

## 2021-02-01 DIAGNOSIS — D72829 Elevated white blood cell count, unspecified: Secondary | ICD-10-CM | POA: Diagnosis not present

## 2021-02-02 ENCOUNTER — Other Ambulatory Visit (HOSPITAL_COMMUNITY)
Admission: RE | Admit: 2021-02-02 | Discharge: 2021-02-02 | Disposition: A | Discharge status: Home / Self Care | Payer: Medicare Other | Source: Ambulatory Visit | Attending: Internal Medicine | Admitting: Internal Medicine

## 2021-02-02 DIAGNOSIS — Z20822 Contact with and (suspected) exposure to covid-19: Secondary | ICD-10-CM | POA: Insufficient documentation

## 2021-02-02 DIAGNOSIS — Z01812 Encounter for preprocedural laboratory examination: Secondary | ICD-10-CM | POA: Insufficient documentation

## 2021-02-02 LAB — SARS CORONAVIRUS 2 (TAT 6-24 HRS): SARS Coronavirus 2: NEGATIVE

## 2021-02-04 ENCOUNTER — Ambulatory Visit (HOSPITAL_COMMUNITY)
Admission: RE | Disposition: A | Discharge status: Home / Self Care | Payer: Self-pay | Source: Home / Self Care | Attending: Internal Medicine

## 2021-02-04 ENCOUNTER — Ambulatory Visit (HOSPITAL_COMMUNITY)
Admission: RE | Admit: 2021-02-04 | Discharge: 2021-02-04 | Disposition: A | Discharge status: Home / Self Care | Payer: Medicare Other | Attending: Internal Medicine | Admitting: Internal Medicine

## 2021-02-04 ENCOUNTER — Ambulatory Visit (HOSPITAL_COMMUNITY): Payer: Medicare Other

## 2021-02-04 ENCOUNTER — Other Ambulatory Visit: Payer: Self-pay

## 2021-02-04 DIAGNOSIS — N189 Chronic kidney disease, unspecified: Secondary | ICD-10-CM | POA: Diagnosis not present

## 2021-02-04 DIAGNOSIS — I428 Other cardiomyopathies: Secondary | ICD-10-CM | POA: Diagnosis not present

## 2021-02-04 DIAGNOSIS — I5022 Chronic systolic (congestive) heart failure: Secondary | ICD-10-CM | POA: Insufficient documentation

## 2021-02-04 DIAGNOSIS — Z87891 Personal history of nicotine dependence: Secondary | ICD-10-CM | POA: Diagnosis not present

## 2021-02-04 DIAGNOSIS — Z7984 Long term (current) use of oral hypoglycemic drugs: Secondary | ICD-10-CM | POA: Diagnosis not present

## 2021-02-04 DIAGNOSIS — Z7982 Long term (current) use of aspirin: Secondary | ICD-10-CM | POA: Diagnosis not present

## 2021-02-04 DIAGNOSIS — I429 Cardiomyopathy, unspecified: Secondary | ICD-10-CM

## 2021-02-04 DIAGNOSIS — E1122 Type 2 diabetes mellitus with diabetic chronic kidney disease: Secondary | ICD-10-CM | POA: Insufficient documentation

## 2021-02-04 DIAGNOSIS — I130 Hypertensive heart and chronic kidney disease with heart failure and stage 1 through stage 4 chronic kidney disease, or unspecified chronic kidney disease: Secondary | ICD-10-CM | POA: Insufficient documentation

## 2021-02-04 DIAGNOSIS — Z79899 Other long term (current) drug therapy: Secondary | ICD-10-CM | POA: Insufficient documentation

## 2021-02-04 DIAGNOSIS — Z95810 Presence of automatic (implantable) cardiac defibrillator: Secondary | ICD-10-CM

## 2021-02-04 DIAGNOSIS — I700 Atherosclerosis of aorta: Secondary | ICD-10-CM | POA: Diagnosis not present

## 2021-02-04 HISTORY — PX: ICD IMPLANT: EP1208

## 2021-02-04 LAB — GLUCOSE, CAPILLARY: Glucose-Capillary: 137 mg/dL — ABNORMAL HIGH (ref 70–99)

## 2021-02-04 SURGERY — ICD IMPLANT

## 2021-02-04 MED ORDER — HEPARIN (PORCINE) IN NACL 1000-0.9 UT/500ML-% IV SOLN
INTRAVENOUS | Status: DC | PRN
  Administered 2021-02-04: 500 mL

## 2021-02-04 MED ORDER — MIDAZOLAM HCL 5 MG/5ML IJ SOLN
INTRAMUSCULAR | Status: AC
  Filled 2021-02-04: qty 5

## 2021-02-04 MED ORDER — ACETAMINOPHEN 325 MG PO TABS: 325.0000 mg | ORAL_TABLET | ORAL | Status: DC | PRN

## 2021-02-04 MED ORDER — ONDANSETRON HCL 4 MG/2ML IJ SOLN: 4.0000 mg | Freq: Four times a day (QID) | INTRAMUSCULAR | Status: DC | PRN

## 2021-02-04 MED ORDER — VANCOMYCIN HCL IN DEXTROSE 1-5 GM/200ML-% IV SOLN
INTRAVENOUS | Status: AC
  Filled 2021-02-04: qty 200

## 2021-02-04 MED ORDER — SODIUM CHLORIDE 0.9 % IV SOLN: INTRAVENOUS | Status: DC

## 2021-02-04 MED ORDER — VANCOMYCIN HCL IN DEXTROSE 1-5 GM/200ML-% IV SOLN
1000.0000 mg | INTRAVENOUS | Status: AC
  Administered 2021-02-04: 1000 mg via INTRAVENOUS

## 2021-02-04 MED ORDER — LIDOCAINE HCL 1 % IJ SOLN
INTRAMUSCULAR | Status: AC
  Filled 2021-02-04: qty 20

## 2021-02-04 MED ORDER — LIDOCAINE HCL (PF) 1 % IJ SOLN
INTRAMUSCULAR | Status: AC
  Filled 2021-02-04: qty 60

## 2021-02-04 MED ORDER — IOHEXOL 350 MG/ML SOLN
INTRAVENOUS | Status: DC | PRN
  Administered 2021-02-04: 10 mL via INTRAVENOUS

## 2021-02-04 MED ORDER — FENTANYL CITRATE (PF) 100 MCG/2ML IJ SOLN
INTRAMUSCULAR | Status: AC
  Filled 2021-02-04: qty 2

## 2021-02-04 MED ORDER — SODIUM CHLORIDE 0.9 % IV SOLN
80.0000 mg | INTRAVENOUS | Status: AC
  Administered 2021-02-04: 80 mg

## 2021-02-04 MED ORDER — FENTANYL CITRATE (PF) 100 MCG/2ML IJ SOLN
INTRAMUSCULAR | Status: DC | PRN
  Administered 2021-02-04 (×4): 25 ug via INTRAVENOUS

## 2021-02-04 MED ORDER — MIDAZOLAM HCL 5 MG/5ML IJ SOLN
INTRAMUSCULAR | Status: DC | PRN
  Administered 2021-02-04 (×5): 2 mg via INTRAVENOUS

## 2021-02-04 MED ORDER — POVIDONE-IODINE 10 % EX SWAB: 2.0000 "application " | Freq: Once | CUTANEOUS | Status: DC

## 2021-02-04 MED ORDER — SODIUM CHLORIDE 0.9 % IV SOLN
INTRAVENOUS | Status: AC
  Filled 2021-02-04: qty 2

## 2021-02-04 MED ORDER — HEPARIN (PORCINE) IN NACL 1000-0.9 UT/500ML-% IV SOLN
INTRAVENOUS | Status: AC
  Filled 2021-02-04: qty 500

## 2021-02-04 MED ORDER — LIDOCAINE HCL (PF) 1 % IJ SOLN
INTRAMUSCULAR | Status: DC | PRN
  Administered 2021-02-04: 25 mL

## 2021-02-04 MED ORDER — CHLORHEXIDINE GLUCONATE 4 % EX LIQD: 4.0000 "application " | Freq: Once | CUTANEOUS | Status: DC

## 2021-02-04 SURGICAL SUPPLY — 8 items
CABLE SURGICAL S-101-97-12 (CABLE) ×2 IMPLANT
ELECT DEFIB PAD ADLT CADENCE (PAD) IMPLANT
ICD GALLANT VR CDVRA500Q (ICD Generator) ×2 IMPLANT
LEAD DURATA 7122Q-58CM (Lead) ×2 IMPLANT
PAD PRO RADIOLUCENT 2001M-C (PAD) ×2 IMPLANT
SHEATH 7FR PRELUDE SNAP 13 (SHEATH) ×2 IMPLANT
SYR CONTROL 10ML ANGIOGRAPHIC (SYRINGE) ×2 IMPLANT
TRAY PACEMAKER INSERTION (PACKS) ×2 IMPLANT

## 2021-02-04 NOTE — Interval H&P Note (Signed)
History and Physical Interval Note:  02/04/2021 7:34 AM  Katherine Larson  has presented today for surgery, with the diagnosis of cardiomyopathy.  The various methods of treatment have been discussed with the patient and family. After consideration of risks, benefits and other options for treatment, the patient has consented to  Procedure(s): ICD IMPLANT (N/A) as a surgical intervention.  The patient's history has been reviewed, patient examined, no change in status, stable for surgery.  I have reviewed the patient's chart and labs.  Questions were answered to the patient's satisfaction.     Cristopher Peru

## 2021-02-04 NOTE — Discharge Instructions (Signed)
    Supplemental Discharge Instructions for  Pacemaker/Defibrillator Patients  Tomorrow, 02/05/21, send in a device transmission  Activity No heavy lifting or vigorous activity with your left/right arm for 6 to 8 weeks.  Do not raise your left/right arm above your head for one week.  Gradually raise your affected arm as drawn below.              02/09/21                    02/10/21                    02/11/21                  02/12/21 __  NO DRIVING until cleared to at your wound check visit.  WOUND CARE - Keep the wound area clean and dry.  Do not get this area wet , no showers until cleared to at your wound check visit . - Tomorrow, 02/05/21, remove the arm sling - Tomorrow, 02/05/21 remove the outer plastic bandage.  Underneath the plastic bandage there are steri strips (paper tapes), DO NOT remove these. - The tape/steri-strips on your wound will fall off; do not pull them off.  No bandage is needed on the site.  DO  NOT apply any creams, oils, or ointments to the wound area. - If you notice any drainage or discharge from the wound, any swelling or bruising at the site, or you develop a fever > 101? F after you are discharged home, call the office at once.  Special Instructions - You are still able to use cellular telephones; use the ear opposite the side where you have your pacemaker/defibrillator.  Avoid carrying your cellular phone near your device. - When traveling through airports, show security personnel your identification card to avoid being screened in the metal detectors.  Ask the security personnel to use the hand wand. - Avoid arc welding equipment, MRI testing (magnetic resonance imaging), TENS units (transcutaneous nerve stimulators).  Call the office for questions about other devices. - Avoid electrical appliances that are in poor condition or are not properly grounded. - Microwave ovens are safe to be near or to operate.  Additional information for defibrillator patients  should your device go off: - If your device goes off ONCE and you feel fine afterward, notify the device clinic nurses. - If your device goes off ONCE and you do not feel well afterward, call 911. - If your device goes off TWICE, call 911. - If your device goes off THREE times in one day, call 911.  DO NOT DRIVE YOURSELF OR A FAMILY MEMBER WITH A DEFIBRILLATOR TO THE HOSPITAL--CALL 911.

## 2021-02-04 NOTE — Progress Notes (Signed)
Report received from [facility]

## 2021-02-04 NOTE — Progress Notes (Signed)
Dr [name] in and ok to d/c home

## 2021-02-04 NOTE — H&P (Signed)
I have seen Katherine Larson is a 75 y.o. female in the office today who has been referred by Dr. [name] Cancel for consideration of ICD implant for primary prevention of sudden death.  The patient's chart has been reviewed and they meet criteria for ICD implant.  I have had a thorough discussion with the patient reviewing options.  The patient and their family (if available) have had opportunities to ask questions and have them answered. The patient and I have decided together through a shared decision making process to proceed with ICD at this time.  Risks, benefits, alternatives to ICD implantation were discussed in detail with the patient today. The patient  understands that the risks include but are not limited to bleeding, infection, pneumothorax, perforation, tamponade, vascular damage, renal failure, MI, stroke, death, inappropriate shocks, and lead dislodgement and she wishes to proceed.  We will therefore schedule device implantation at the next available time.

## 2021-02-07 ENCOUNTER — Telehealth: Payer: Self-pay

## 2021-02-07 ENCOUNTER — Encounter (HOSPITAL_COMMUNITY): Payer: Self-pay | Admitting: Internal Medicine

## 2021-02-07 MED FILL — Midazolam HCl Inj 5 MG/5ML (Base Equivalent): INTRAMUSCULAR | Qty: 5 | Status: AC

## 2021-02-07 MED FILL — Fentanyl Citrate Preservative Free (PF) Inj 100 MCG/2ML: INTRAMUSCULAR | Qty: 2 | Status: AC

## 2021-02-07 MED FILL — Lidocaine HCl Local Preservative Free (PF) Inj 1%: INTRAMUSCULAR | Qty: 25 | Status: AC

## 2021-02-07 NOTE — Telephone Encounter (Signed)
Pt returned phone call.  She has removed outer bandage and denies any s/s of infection.  Reviwed activity restrictions.    Pt reporting some pain that is being managed well with prescription pain medication.

## 2021-02-07 NOTE — Telephone Encounter (Signed)
Attempted to reach pt, no answer.  LVM requesting callback to DC with phone # and hours.    Follow-up after same day discharge: Implant date: 02/04/21 MD: Cristopher Peru, MD Device: SJM ICD  Location: Left chest   Wound check visit: 02/17/21 90 day MD follow-up: 05/10/21  Remote Transmission received:No- Direct comm on [place] 02/07/21  Dressing removed: TBD

## 2021-02-07 NOTE — Telephone Encounter (Signed)
-----   Message from [place], [place] sent at 02/04/2021 12:11 PM EDT ----- Same day d/c  SJM ICD implant  GT

## 2021-02-08 ENCOUNTER — Encounter: Payer: Self-pay | Admitting: Neurology

## 2021-02-17 ENCOUNTER — Ambulatory Visit (INDEPENDENT_AMBULATORY_CARE_PROVIDER_SITE_OTHER): Payer: Medicare Other | Admitting: Emergency Medicine

## 2021-02-17 ENCOUNTER — Other Ambulatory Visit: Payer: Self-pay

## 2021-02-17 DIAGNOSIS — I5042 Chronic combined systolic (congestive) and diastolic (congestive) heart failure: Secondary | ICD-10-CM

## 2021-02-17 NOTE — Progress Notes (Signed)
Wound check appointment. Steri-strips removed. Wound without redness or edema. Incision edges approximated, wound well healed. Normal device function. Thresholds, sensing, and impedances consistent with implant measurements. Device programmed at 3.5V for extra safety margin until 3 month visit. Histogram distribution appropriate for patient and level of activity. No ventricular arrhythmias noted. Patient educated about wound care, arm mobility, lifting restrictions, shock plan. ROV with Dr. [name] on 05/10/21.  Patient is enrolled in remote monitoring, next scheduled check 05/06/21.

## 2021-02-24 DIAGNOSIS — M47816 Spondylosis without myelopathy or radiculopathy, lumbar region: Secondary | ICD-10-CM | POA: Diagnosis not present

## 2021-02-24 DIAGNOSIS — M4722 Other spondylosis with radiculopathy, cervical region: Secondary | ICD-10-CM | POA: Diagnosis not present

## 2021-02-24 DIAGNOSIS — Z9689 Presence of other specified functional implants: Secondary | ICD-10-CM | POA: Diagnosis not present

## 2021-02-24 DIAGNOSIS — M546 Pain in thoracic spine: Secondary | ICD-10-CM | POA: Diagnosis not present

## 2021-03-01 ENCOUNTER — Other Ambulatory Visit: Payer: Self-pay | Admitting: Internal Medicine

## 2021-03-04 ENCOUNTER — Ambulatory Visit: Payer: Medicare Other | Admitting: Gastroenterology

## 2021-03-05 NOTE — Progress Notes (Signed)
Referring Provider: Harlan Stains, MD Primary Care Physician:  Harlan Stains, MD Primary GI Physician: Dr. Gala Romney  Chief Complaint  Patient presents with  . Gastroesophageal Reflux    Bad at times  . Diarrhea  . black stool    Takes Iron    HPI:   Katherine Larson is a 75 y.o. female with history of dysphagia, GERD, anemia, and lymphocytic colitis.  Dysphagia previously evaluated with EGD in 2020, esophagus dilated, but she has persistent symptoms.  BPE November 2020 with moderate esophageal dysmotility with prolonged thoracic retention, laryngeal penetration to the level of vocal cords, prominent GERD.  She was offered modified barium swallow study with possible esophageal manometry, but patient declined.  Colonoscopy June 2021 with diverticulosis, hemorrhoids, normal TI.  Random colon biopsies significant for chronic lymphocytic colitis.  She became significantly constipated on Entocort 9 mg daily and persisted on 6 mg daily.  When she stopped Entocort, the diarrhea returned.  Last seen in our office 11/02/2020.  She has gone back to Entocort 3 mg daily as needed if having diarrhea.  Diarrhea had not been as bad for the last several months.  Taking budesonide about 3 times per week.  She did not have blood work completed to follow-up on anemia.   Denied melena, rectal bleeding, abdominal pain, heartburn, nausea, or vomiting.  She was advised to take Entocort 3 mg daily for the next 30 days.  If she develops constipation, try taking every other day.  Once 30 days is completed, stop medication and monitor.  If more than 3 stools daily and stools are loose to watery, let us know.  Okay to use Imodium for occasional diarrhea.  Advised to wean off pantoprazole with taking once daily for 2 weeks then stop if tolerated.  Use Pepcid 20 mg twice daily for GERD.  Would also update labs for anemia.  Labs completed 11/03/2020 with hemoglobin 10.9 (stable), iron panel within normal limits.   Recommended repeat CBC and iron panel with ferritin in March 2022.   Additional labs we ordered were not completed. Most recent CBC 01/18/21 with hemoglobin 11.1.   Today:   Lymphocytic colitis: States she has been taking Entocort 3 mg daily since her last office visit until last week when she ran out.  She had 2 surgeries in March (spinal cord stimulator exchange and ICD implant).  Since the surgeries, diarrhea has been more of an issue.  Typically with 3-4 BMs daily with 1 BM overnight.  Last night, she had about 5 BMs.  Stools are watery.  Denies BRBPR.  Stools are chronically black on iron.  Denies abdominal pain.  Currently using Imodium.  States prior to surgery, she would have occasional loose bowel movements maybe 1 day a week.  Denies recent antibiotics or overnight hospitalizations. No sick contacts. Doesn't drink well water.   NSAIDs: None.  Smoking:  No.  GERD: Breakthrough symptoms daily after lunch, but may also occur at other times throughout the day. Doesn't matter what she eats. She tried tapering off Protonix, but she did not tolerate this.  Intermittent dysphagia. Has about stopped eating meats.  Also trouble with pills. No trouble with soft foods.   No nausea or vomiting. She had been having some nausea/vomiting in the morning after her surgery, but this has resolved for now. This always happens after surgery. She does admit to early satiety. Used to be on Reglan. States pharmacist recommended she stop the medication. Has been off for a  few years.  No prior GES on file.   Anemia:  No overt GI bleeding.  Denies hematuria or vaginal bleeding.    Denies fever, chills, shortness of breath, or chest pain.  Denies presyncope, but admits to intermittent dizziness like the room is spinning. Seeing a neurologist.   Past Medical History:  Diagnosis Date  . Anemia    anemia of chronic disease +/- IDA followed by Dr. Earlie Server. received iron infusions in the past  . Asthma   .  Borderline glaucoma   . CAD (coronary artery disease)    a. s/p CABG 1996. b. low risk nuc 2011. c. LHC 04/2016 due to drop in EF -> occluded native LAD,  widely patent sequential LIMA-D1-LAD.  Marland Kitchen Chronic kidney disease    "mild stage of kidney disease"  . Chronic systolic CHF (congestive heart failure) ([place])   . Complication of anesthesia   . DM2 (diabetes mellitus, type 2) ([place])    not on any medicine for this at this time, 05/2016  . Dyspnea    with exertion  . GERD (gastroesophageal reflux disease)   . Gout   . History of blood transfusion   . History of hiatal hernia    in 60s  . History of pneumonia    March, 2016  . HLD (hyperlipidemia)   . HTN (hypertension)   . Hyperthyroidism 01/21/2016  . Inappropriate sinus tachycardia   . LBBB (left bundle branch block)    a. Seen in 04/2016  . 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 Macular degeneration    left  . Myocardial infarction ([place])    1996  . Neuropathy   . Neuropathy   . NICM (nonischemic cardiomyopathy) ([place])    a. Dx 04/2016 - EF 25-30%, diffuse HK, elevated LVEDP, mild MR, mod LAE.  Marland Kitchen NSVT (nonsustained ventricular tachycardia) ([place])   . PAT (paroxysmal atrial tachycardia) ([place])   . Pneumonia   . PONV (postoperative nausea and vomiting)    "after just about every surgery I've had"  . Rheumatoid arthritis ([facility])    RA- hands  . Seasonal allergies     Past Surgical History:  Procedure Laterality Date  . ABDOMINAL HYSTERECTOMY    . APPENDECTOMY    . BACK SURGERY    . BIOPSY  04/27/2020   Procedure: BIOPSY;  Surgeon: Daneil Dolin, MD;  [facility];  Service: Endoscopy;;  ascending colon biopsy  . CARDIAC CATHETERIZATION N/A 05/01/2016   Procedure: Right/Left Heart Cath and Coronary/Graft Angiography;  Surgeon: Leonie Man, MD;  Location: [facility];  Service: Cardiovascular;  Laterality: N/A;  . CHOLECYSTECTOMY    . COLONOSCOPY  11/2011   Dr. Magod:ext/int hemorrhoids, diverticulosis sigmoid colon  and distal desc colon, mid-desc colon hyperplastic polyps.   . COLONOSCOPY WITH PROPOFOL N/A 04/27/2020   Rourk: Diverticulosis, hemorrhoids, normal terminal ileum.  Random colon biopsies significant for chronic lymphocytic colitis  . CORONARY ARTERY BYPASS GRAFT  1996   2 vessels  . ESOPHAGOGASTRODUODENOSCOPY  11/2011   Dr. Watt Climes: small hiatal hernia, one non-bleeding superficial gastric ulcer, medium-sized diverticulum in area of papilla  . ESOPHAGOGASTRODUODENOSCOPY N/A 12/29/2015   Dr. Gala Romney: Chronic inactive gastritis, normal esophagus status post dilation, duodenal diverticula  . ESOPHAGOGASTRODUODENOSCOPY (EGD) WITH PROPOFOL N/A 07/24/2019   Dr. Gala Romney: Normal esophagus status post empiric dilation, small hiatal hernia, large D2 diverticulum  . EYE SURGERY Bilateral    cataract removal  . FOOT SURGERY     removal bone spur  .  FRACTURE SURGERY Right    arm  . GIVENS CAPSULE STUDY  11/2012   Dr. Watt Climes: minimal gastritis, normal small bowel capsule  . HERNIA REPAIR     hiatal hernia  . ICD IMPLANT N/A 02/04/2021   Procedure: ICD IMPLANT;  Surgeon: Evans Lance, MD;  Location: [facility];  Service: Cardiovascular;  Laterality: N/A;  . Venia Minks DILATION N/A 07/24/2019   Procedure: Venia Minks DILATION;  Surgeon: Daneil Dolin, MD;  [facility];  Service: Endoscopy;  Laterality: N/A;  . MAXIMUM ACCESS (MAS)POSTERIOR LUMBAR INTERBODY FUSION (PLIF) 1 LEVEL N/A 08/14/2013   Procedure:  MAXIMUM ACCESS SURGERY(MAS) POSTERIOR LUMBAR INTERBODY FUSION LUMBAR THREE-FOUR ;  Surgeon: Eustace Moore, MD;  Location: [facility];  Service: Neurosurgery;  Laterality: N/A;   MAXIMUM ACCESS SURGERY(MAS) POSTERIOR LUMBAR INTERBODY FUSION LUMBAR THREE-FOUR   . PTCA    . RIGHT/LEFT HEART CATH AND CORONARY ANGIOGRAPHY N/A 03/26/2020   Procedure: RIGHT/LEFT HEART CATH AND CORONARY ANGIOGRAPHY;  Surgeon: Leonie Man, MD;  Location: [place] [facility];  Service: Cardiovascular;  Laterality: N/A;  .  SPINAL CORD STIMULATOR BATTERY EXCHANGE N/A 01/19/2021   Procedure: Spinal cord stimulator battery change;  Surgeon: Reece Agar, MD;  Location: [place];  Service: Neurosurgery;  Laterality: N/A;  . SPINAL CORD STIMULATOR INSERTION N/A 06/16/2016   Procedure: LUMBAR SPINAL CORD STIMULATOR INSERTION;  Surgeon: Clydell Hakim, MD;  Location: [facility] [facility];  Service: Neurosurgery;  Laterality: N/A;  LUMBAR SPINAL CORD STIMULATOR INSERTION  . tens  05/2016  . TONSILLECTOMY      Current Outpatient Medications  Medication Sig Dispense Refill  . albuterol (PROVENTIL HFA;VENTOLIN HFA) 108 (90 Base) MCG/ACT inhaler Take 2 puffs 3 times a day and every 4 hours as needed. (Patient taking differently: Inhale 2 puffs into the lungs every 4 (four) hours as needed for wheezing or shortness of breath.)    . allopurinol (ZYLOPRIM) 300 MG tablet Take 300 mg by mouth daily.    Marland Kitchen aspirin EC 81 MG EC tablet Take 1 tablet (81 mg total) by mouth daily.    Marland Kitchen atorvastatin (LIPITOR) 80 MG tablet Take 80 mg by mouth daily.    . calcium carbonate (TUMS - DOSED IN MG ELEMENTAL CALCIUM) 500 MG chewable tablet Chew 1 tablet by mouth as needed for indigestion or heartburn.    . Carboxymethylcellulose Sodium (THERATEARS OP) Place 1 drop into both eyes 3 (three) times daily as needed (dry eyes).    . carvedilol (COREG) 6.25 MG tablet Take 6.25 mg by mouth 2 (two) times daily with a meal.    . cholecalciferol (VITAMIN D3) 25 MCG (1000 UT) tablet Take 1,000 Units by mouth daily.    Marland Kitchen dexlansoprazole (DEXILANT) 60 MG capsule Take 1 capsule (60 mg total) by mouth daily before breakfast. 90 capsule 1  . fexofenadine (ALLEGRA) 180 MG tablet Take 180 mg by mouth daily.    . fluticasone (FLONASE) 50 MCG/ACT nasal spray Place 2 sprays into both nostrils daily as needed for allergies.    . furosemide (LASIX) 40 MG tablet Take 40 mg by mouth 2 (two) times daily.     Marland Kitchen gabapentin (NEURONTIN) 300 MG capsule Take 300 mg by mouth 3 (three) times  daily.     Marland Kitchen HYDROcodone-acetaminophen (NORCO) 10-325 MG tablet Take 1 tablet by mouth every 6 (six) hours as needed for moderate pain.    . iron polysaccharides (NIFEREX) 150 MG capsule Take 150 mg by mouth daily.    Marland Kitchen  Lancets (ONETOUCH DELICA PLUS [id number]) MISC     . leflunomide (ARAVA) 20 MG tablet Take 20 mg by mouth daily.    Marland Kitchen losartan (COZAAR) 100 MG tablet Take 100 mg by mouth daily.    Marland Kitchen MAGNESIUM-OXIDE 400 (241.3 Mg) MG tablet TAKE 1 TABLET(400 MG) BY MOUTH DAILY (Patient taking differently: Take 400 mg by mouth daily.) 30 tablet 6  . meclizine (ANTIVERT) 12.5 MG tablet Take 12.5 mg by mouth 3 (three) times daily as needed for dizziness.    . metFORMIN (GLUCOPHAGE) 500 MG tablet Take 500 mg by mouth in the morning and at bedtime.     . montelukast (SINGULAIR) 10 MG tablet Take 10 mg by mouth at bedtime.    . Multiple Vitamins-Minerals (OCUVITE ADULT 50+ PO) Take 1 tablet by mouth daily.    . nitroGLYCERIN (NITROSTAT) 0.4 MG SL tablet Place 1 tablet (0.4 mg total) under the tongue every 5 (five) minutes as needed for chest pain. Reported on 03/24/2016 25 tablet 3  . Polyethyl Glycol-Propyl Glycol (SYSTANE OP) Apply 1 drop to eye 3 (three) times daily as needed (Dry Eyes).    . potassium chloride SA (K-DUR,KLOR-CON) 20 MEQ tablet Take 1 tablet (20 mEq total) by mouth 2 (two) times daily. 40 tablet 11  . triamcinolone cream (KENALOG) 0.1 % Apply 1 application topically daily as needed (for irritation).    . budesonide (ENTOCORT EC) 3 MG 24 hr capsule Take 6 mg by mouth daily. (Patient not taking: Reported on 03/07/2021) 90 capsule 1  . oxyCODONE (OXY IR/ROXICODONE) 5 MG immediate release tablet Take 5 mg by mouth every 6 (six) hours as needed for severe pain.     Current Facility-Administered Medications  Medication Dose Route Frequency Provider Last Rate Last Admin  . sodium chloride flush (NS) 0.9 % injection 3 mL  3 mL Intravenous Q12H Imogene Burn, PA-C        Allergies as of  03/07/2021 - Review Complete 03/07/2021  Allergen Reaction Noted  . Biaxin [clarithromycin] Rash and Other (See Comments) 02/02/2010  . Penicillins Rash and Other (See Comments) 02/02/2010  . Penicillin v Other (See Comments) 11/25/2020    Family History  Problem Relation Age of Onset  . Heart attack Father   . Heart attack Mother   . Bladder Cancer Brother   . Cervical cancer Daughter        ?  . Colon cancer Neg Hx     Social History   Socioeconomic History  . Marital status: Married    Spouse name: Not on file  . Number of children: 3  . Years of education: Not on file  . Highest education level: Not on file  Occupational History  . Not on file  Tobacco Use  . Smoking status: Former Smoker    Packs/day: 0.75    Years: 15.00    Pack years: 11.25    Types: Cigarettes    Quit date: 11/20/1994    Years since quitting: 26.3  . Smokeless tobacco: Never Used  . Tobacco comment: Quit in 1996  Vaping Use  . Vaping Use: Never used  Substance and Sexual Activity  . Alcohol use: Never    Alcohol/week: 0.0 standard drinks  . Drug use: Never  . Sexual activity: Yes  Other Topics Concern  . Not on file  Social History Narrative   2 living children, one deceased age 80   Social Determinants of Health   Financial Resource Strain: Not on file  Food Insecurity: Not on file  Transportation Needs: Not on file  Physical Activity: Not on file  Stress: Not on file  Social Connections: Not on file    Review of Systems: Gen: See HPI CV: See HPI Resp: See HPI GI: See HPI Heme: See HPI  Physical Exam: BP 117/70   Pulse 93   Temp (!) 96.9 F (36.1 C) (Temporal)   Ht 5\' 3"  (1.6 m)   Wt 148 lb 12.8 oz (67.5 kg)   BMI 26.36 kg/m  General:   Alert and oriented. No distress noted. Pleasant and cooperative.  Head:  Normocephalic and atraumatic. Eyes:  Conjuctiva clear without scleral icterus. Heart:  S1, S2 present without murmurs appreciated. Lungs:  Clear to auscultation  bilaterally. No wheezes, rales, or rhonchi. No distress.  Abdomen:  +BS, soft, non-tender and non-distended. No rebound or guarding. No HSM or masses noted. Msk:  Symmetrical without gross deformities. Normal posture. Extremities:  Without edema. Neurologic:  Alert and  oriented x4 Psych: Normal mood and affect.   Assessment: 75 year old female with history of dysphagia, GERD, anemia, and lymphocytic colitis presenting today for follow-up with chief complaint of worsening diarrhea, ongoing dysphagia, and uncontrolled GERD.  Diarrhea: History of lymphocytic colitis diagnosed in June 2021.  She had been doing well on Entocort 3 mg daily, but after 2 surgeries in March 2022, she reports worsening diarrhea with at least 3-4 watery BMs daily and 1 nocturnal BM; however, last night, she had about 5 watery BMs.  Denies BRBPR or abdominal pain.  Stools are chronically dark on iron.  Denies recent antibiotics, overnight hospitalizations, sick contacts, or well water.  She does not smoke.  She may be experiencing a flare of lymphocytic colitis, but cannot rule out infectious diarrhea.  We will need to check stool studies.  If negative, we will increase Entocort.  GERD: Uncontrolled on Protonix 40 mg twice daily.  She previously tried tapering off of Protonix due to lymphocytic colitis, but she could not tolerate this.  Interestingly, she does report that she used to be on Reglan years ago, but the pharmacist recommended she stop the medication.  She denies any adverse reactions to the medication.  Unclear why she was on Reglan.  No prior gastric emptying study on file.  She does admit to early satiety.  She may be losing effect to pantoprazole.  We will try her on Dexilant 60 mg daily.  With report of early satiety and history of diabetes, have to consider gastroparesis which could also be influencing GERD symptoms.  If she continues with symptoms despite change of PPI, consider GES at that  time.  Dysphagia: Solid food and pill dysphagia.  Previously evaluated with EGD in 2020 s/p empiric esophageal dilation without improvement. BPE November 2020 with moderate esophageal dysmotility with prolonged thoracic retention, laryngeal penetration to the level of vocal cords, prominent GERD.  She was offered MBSS with possible esophageal manometry at that time, but patient declined.   Suspect primary etiology is esophageal dysmotility.  She could be developing achalasia.  In setting of chronic uncontrolled GERD, cannot rule out esophageal web, ring, or stricture.  We will proceed BPE for further evaluation.  Anemia: Chronic history of anemia on oral iron.  Denies overt GI bleeding or other obvious blood loss.  Stools are chronically dark in the setting of oral iron.  Most recent EGD in 2020 with normal esophagus, small hiatal hernia, large D2 diverticulum.  Colonoscopy June 2021 with diverticulosis, hemorrhoids, and normal  TI, random colon biopsies significant for chronic lymphocytic colitis.  Prior Givens capsule with Dr. Watt Climes in 2014 with minimal gastritis, normal small bowel capsule.  Last labs on file 01/18/2021 with hemoglobin 11.1 (stable) with normocytic indices.  Last iron panel on file December 2021 with ferritin 42, saturation 16%, iron 65 (all low normal).  We will plan to update labs at this time.   Plan: 1.  CBC, BMP, iron panel, C. difficile GDH and toxin A/B and GI pathogen panel.   2.  Barium pill esophagram  3.  Stop Protonix and start Dexilant 60 mg daily.   4.  Reinforced GERD diet/lifestyle.  Written instructions were provided.  5.  Continue oral iron daily.  6.  Continue to avoid all NSAIDs.  7.  Follow-up in 8 weeks.     Aliene Altes, PA-C [facility] 03/07/2021

## 2021-03-07 ENCOUNTER — Encounter: Payer: Self-pay | Admitting: Gastroenterology

## 2021-03-07 ENCOUNTER — Encounter: Payer: Self-pay | Admitting: *Deleted

## 2021-03-07 ENCOUNTER — Ambulatory Visit: Payer: Medicare Other | Admitting: Gastroenterology

## 2021-03-07 ENCOUNTER — Other Ambulatory Visit: Payer: Self-pay

## 2021-03-07 VITALS — BP 117/70 | HR 93 | Temp 96.9°F | Ht 63.0 in | Wt 148.8 lb

## 2021-03-07 DIAGNOSIS — R197 Diarrhea, unspecified: Secondary | ICD-10-CM

## 2021-03-07 DIAGNOSIS — K52832 Lymphocytic colitis: Secondary | ICD-10-CM

## 2021-03-07 DIAGNOSIS — K219 Gastro-esophageal reflux disease without esophagitis: Secondary | ICD-10-CM | POA: Diagnosis not present

## 2021-03-07 DIAGNOSIS — D649 Anemia, unspecified: Secondary | ICD-10-CM | POA: Diagnosis not present

## 2021-03-07 DIAGNOSIS — R1319 Other dysphagia: Secondary | ICD-10-CM | POA: Diagnosis not present

## 2021-03-07 MED ORDER — DEXLANSOPRAZOLE 60 MG PO CPDR
60.0000 mg | DELAYED_RELEASE_CAPSULE | Freq: Every day | ORAL | 1 refills | Status: DC
Start: 2021-03-07 — End: 2021-03-29

## 2021-03-07 NOTE — Patient Instructions (Addendum)
Please have labs and stool studies completed at [facility].  If stool studies are negative, we will start Entocort 6 mg daily.  We will arrange for you to have a swallowing study at [facility].  Stop pantoprazole and try Dexilant 60 mg daily for acid reflux. Please let me know if this is too expensive.   Follow a GERD diet/lifestyle:  Avoid fried, fatty, greasy, spicy, citrus foods. Avoid caffeine and carbonated beverages. Avoid chocolate. Try eating 4-6 small meals a day rather than 3 large meals. Do not eat within 3 hours of laying down. Prop head of bed up on wood or bricks to create a 6 inch incline.  Continue taking iron daily.  Continue to avoid all NSAID products.   We will plan to see you back in 8 weeks. Please call with questions or concerns prior.    Aliene Altes, PA-C [facility]

## 2021-03-10 ENCOUNTER — Ambulatory Visit (HOSPITAL_COMMUNITY): Payer: Medicare Other

## 2021-03-10 DIAGNOSIS — R197 Diarrhea, unspecified: Secondary | ICD-10-CM | POA: Diagnosis not present

## 2021-03-10 DIAGNOSIS — D649 Anemia, unspecified: Secondary | ICD-10-CM | POA: Diagnosis not present

## 2021-03-11 LAB — IRON,TIBC AND FERRITIN PANEL
%SAT: 20 % (calc) (ref 16–45)
Ferritin: 48 ng/mL (ref 16–288)
Iron: 72 ug/dL (ref 45–160)
TIBC: 363 mcg/dL (calc) (ref 250–450)

## 2021-03-11 LAB — CBC WITH DIFFERENTIAL/PLATELET
Absolute Monocytes: 756 cells/uL (ref 200–950)
Basophils Absolute: 50 cells/uL (ref 0–200)
Basophils Relative: 0.4 %
Eosinophils Absolute: 273 cells/uL (ref 15–500)
Eosinophils Relative: 2.2 %
HCT: 31.9 % — ABNORMAL LOW (ref 35.0–45.0)
Hemoglobin: 10.6 g/dL — ABNORMAL LOW (ref 11.7–15.5)
Lymphs Abs: 3249 cells/uL (ref 850–3900)
MCH: 31.6 pg (ref 27.0–33.0)
MCHC: 33.2 g/dL (ref 32.0–36.0)
MCV: 95.2 fL (ref 80.0–100.0)
MPV: 10.3 fL (ref 7.5–12.5)
Monocytes Relative: 6.1 %
Neutro Abs: 8072 cells/uL — ABNORMAL HIGH (ref 1500–7800)
Neutrophils Relative %: 65.1 %
Platelets: 218 10*3/uL (ref 140–400)
RBC: 3.35 10*6/uL — ABNORMAL LOW (ref 3.80–5.10)
RDW: 14 % (ref 11.0–15.0)
Total Lymphocyte: 26.2 %
WBC: 12.4 10*3/uL — ABNORMAL HIGH (ref 3.8–10.8)

## 2021-03-11 LAB — BASIC METABOLIC PANEL
BUN: 19 mg/dL (ref 7–25)
CO2: 28 mmol/L (ref 20–32)
Calcium: 9.5 mg/dL (ref 8.6–10.4)
Chloride: 101 mmol/L (ref 98–110)
Creat: 0.83 mg/dL (ref 0.60–0.93)
Glucose, Bld: 113 mg/dL — ABNORMAL HIGH (ref 65–99)
Potassium: 4.2 mmol/L (ref 3.5–5.3)
Sodium: 139 mmol/L (ref 135–146)

## 2021-03-13 ENCOUNTER — Encounter: Payer: Self-pay | Admitting: Gastroenterology

## 2021-03-15 ENCOUNTER — Ambulatory Visit (HOSPITAL_COMMUNITY)
Admission: RE | Admit: 2021-03-15 | Discharge: 2021-03-15 | Disposition: A | Discharge status: Home / Self Care | Payer: Medicare Other | Source: Ambulatory Visit | Attending: Gastroenterology | Admitting: Gastroenterology

## 2021-03-15 DIAGNOSIS — T17320A Food in larynx causing asphyxiation, initial encounter: Secondary | ICD-10-CM | POA: Diagnosis not present

## 2021-03-15 DIAGNOSIS — R1319 Other dysphagia: Secondary | ICD-10-CM | POA: Diagnosis not present

## 2021-03-15 DIAGNOSIS — R1310 Dysphagia, unspecified: Secondary | ICD-10-CM | POA: Diagnosis not present

## 2021-03-22 ENCOUNTER — Encounter: Payer: Self-pay | Admitting: Internal Medicine

## 2021-03-28 DIAGNOSIS — E1140 Type 2 diabetes mellitus with diabetic neuropathy, unspecified: Secondary | ICD-10-CM | POA: Diagnosis not present

## 2021-03-28 DIAGNOSIS — D509 Iron deficiency anemia, unspecified: Secondary | ICD-10-CM | POA: Diagnosis not present

## 2021-03-28 DIAGNOSIS — R42 Dizziness and giddiness: Secondary | ICD-10-CM | POA: Diagnosis not present

## 2021-03-28 DIAGNOSIS — I129 Hypertensive chronic kidney disease with stage 1 through stage 4 chronic kidney disease, or unspecified chronic kidney disease: Secondary | ICD-10-CM | POA: Diagnosis not present

## 2021-03-28 DIAGNOSIS — R202 Paresthesia of skin: Secondary | ICD-10-CM | POA: Diagnosis not present

## 2021-03-28 DIAGNOSIS — Z0000 Encounter for general adult medical examination without abnormal findings: Secondary | ICD-10-CM | POA: Diagnosis not present

## 2021-03-28 DIAGNOSIS — I700 Atherosclerosis of aorta: Secondary | ICD-10-CM | POA: Diagnosis not present

## 2021-03-28 DIAGNOSIS — E538 Deficiency of other specified B group vitamins: Secondary | ICD-10-CM | POA: Diagnosis not present

## 2021-03-28 DIAGNOSIS — N183 Chronic kidney disease, stage 3 unspecified: Secondary | ICD-10-CM | POA: Diagnosis not present

## 2021-03-28 DIAGNOSIS — E785 Hyperlipidemia, unspecified: Secondary | ICD-10-CM | POA: Diagnosis not present

## 2021-03-28 DIAGNOSIS — M109 Gout, unspecified: Secondary | ICD-10-CM | POA: Diagnosis not present

## 2021-03-28 DIAGNOSIS — E1121 Type 2 diabetes mellitus with diabetic nephropathy: Secondary | ICD-10-CM | POA: Diagnosis not present

## 2021-03-29 ENCOUNTER — Other Ambulatory Visit: Payer: Self-pay | Admitting: Gastroenterology

## 2021-03-29 ENCOUNTER — Telehealth: Payer: Self-pay | Admitting: Internal Medicine

## 2021-03-29 ENCOUNTER — Telehealth: Payer: Self-pay | Admitting: *Deleted

## 2021-03-29 DIAGNOSIS — K219 Gastro-esophageal reflux disease without esophagitis: Secondary | ICD-10-CM

## 2021-03-29 MED ORDER — ESOMEPRAZOLE MAGNESIUM 40 MG PO CPDR: 40.0000 mg | DELAYED_RELEASE_CAPSULE | Freq: Two times a day (BID) | ORAL | 3 refills | Status: DC

## 2021-03-29 NOTE — Telephone Encounter (Signed)
Per BPE result note: [facility] Clinical Pool: Patient will need EGD +/- dilation with propofol with Dr. Gala Romney pending cardiac clearance.  ASA III *Please make note that patient has ICD* Hold iron x 7 days prior.  Hold metformin morning of procedure.   Will await cardiac clearance for EGD/-/+DIL.

## 2021-03-29 NOTE — Telephone Encounter (Signed)
Spoke with patient.  Advised that she go ahead and submit her stool studies.  I have also sent a prescription for Nexium 40 mg twice daily to her pharmacy in place of [place] which was too expensive.   I also reviewed her lab results and BPE results with her.  She would like to proceed with EGD.  See result note for details.

## 2021-03-29 NOTE — Telephone Encounter (Addendum)
Our provider Aliene Altes, PA would like to see if we could obtain cardiac clearance for pt due to history of ischemic cardiomyopathy, CHF with recent ICD in March.  She is ready to proceed with EGD if cleared.

## 2021-03-29 NOTE — Telephone Encounter (Signed)
Spoke with the pt was advised that she couldn't get a stool sample because she was advised by the girl who drew her blood that they can't except watery of formed stools. Pt was upset because she couldn't get anything yet besides diarrhea. I advised her to take that up there because that can be tested, its the formed stools that they cannot test. She is going to wait a couple of days and try it. The other reason she called was because the Rx you prescribed for her she stated it was $300.00 and she can't afford it. Pt states is there something else you could do.

## 2021-03-29 NOTE — Telephone Encounter (Signed)
Pt said that she wanted to leave a message for Aliene Altes, PA that she is unable to do her stool sample and the medication she was put on is over $300. Please call [phone]

## 2021-03-29 NOTE — Telephone Encounter (Signed)
Pre-op covering staff, can you please help get form clearance form scanned into the chart?  Thank you!

## 2021-03-29 NOTE — Telephone Encounter (Signed)
NOTED    LMOVM Rx (Nexium 40 mg) sent in to pharmacy with instructions.

## 2021-03-30 NOTE — Telephone Encounter (Signed)
   Name: Katherine Larson  DOB: 07-28-1946  MRN: [id number]   Primary Cardiologist: Jenkins Rouge, MD  Chart reviewed as part of pre-operative protocol coverage.   Left message for patient to call back for ongoing preop assessment.  Per previous recommendation by Dr. [name] Cancel and given lack of interval change in cardiac history since that time, patient can hold aspirin 5-7 days prior to her upcoming EGD +/- dilation with plans to restart as soon as she is cleared to do so by her gastroenterologist.   Abigail Butts, PA-C 03/30/2021, 2:40 PM

## 2021-03-30 NOTE — Telephone Encounter (Signed)
   [facility] Pre-operative Risk Assessment    Patient Name: Katherine Larson  DOB: [birth year] MRN: [id number]  HEARTCARE STAFF: - Please ensure there is not already an duplicate clearance open for this procedure. - Under Visit Info/Reason for Call, type in Other and utilize the format Clearance MM/DD/YY or Clearance TBD. Do not use dashes or single digits. - If request is for dental extraction, please clarify the # of teeth to be extracted.  Request for surgical clearance:  1. What type of surgery is being performed? EGD +/- DILATION   2. When is this surgery scheduled? TBD   3. What type of clearance is required (medical clearance vs. Pharmacy clearance to hold med vs. Both)? MEDICAL  4. Are there any medications that need to be held prior to surgery and how long? ASA   5. Practice name and name of physician performing surgery? [facility]; DR. Gala Romney   6. What is the office phone number? [phone]   7.   What is the office fax number? [phone]  8.   Anesthesia type (None, local, MAC, general) ? PROPOFOL   Julaine Hua 03/30/2021, 9:12 AM  _________________________________________________________________   (provider comments below)

## 2021-03-31 NOTE — Telephone Encounter (Signed)
    Katherine Larson DOB:  1946/02/22  MRN:  [id number]   Primary Cardiologist: Jenkins Rouge, MD  Chart reviewed as part of pre-operative protocol coverage.   LeftVM requesting call back.  Loel Dubonnet, NP 03/31/2021, 2:18 PM

## 2021-04-01 NOTE — Telephone Encounter (Signed)
   Name: Katherine Larson  DOB: [birth year]  MRN: [id number]   Primary Cardiologist: Jenkins Rouge, MD  Chart reviewed as part of pre-operative protocol coverage. Patient was contacted 04/01/2021 in reference to pre-operative risk assessment for pending surgery as outlined below.  Katherine Larson was last seen in clinic on 01/18/21 by Dr. [name]. She had ICD placed 02/04/21 due to LVEF <35%. Since that day, Katherine Larson has done well. She reports no anginal symptoms and exercise tolerance of >4 METS.   Per previous recommendations she may hold Aspirin 5-7 days prior to upcoming EGD if needed.   Therefore, based on ACC/AHA guidelines, the patient would be at acceptable risk for the planned procedure without further cardiovascular testing.   The patient was advised that if she develops new symptoms prior to surgery to contact our office to arrange for a follow-up visit, and she verbalized understanding.  I will route this recommendation to the requesting party via Epic fax function and remove from pre-op pool. Please call with questions.  Loel Dubonnet, NP 04/01/2021, 12:52 PM

## 2021-04-04 NOTE — Telephone Encounter (Signed)
Clearance received. Will call patient to schedule once we receive his future procedure schedule

## 2021-04-13 NOTE — Progress Notes (Addendum)
NEUROLOGY CONSULTATION NOTE  Katherine Larson MRN: [id number] DOB: 1946/09/01  Referring provider: Harlan Stains, MD Primary care provider: Harlan Stains, MD  Reason for consult:  Scalp paresthesias, vertigo  Assessment/Plan:   1.  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  Right sided peripheral vertigo - either BPPV or cervicogenic 3.  Cervical spondylosis  1.  Increase gabapentin to 400mg  three times daily 2.  Consider vestibular rehab (patient defers at this time) 3.  Follow up 6 months.   Subjective:  Katherine Larson is a 75 year old right-handed female with CAD, CKD, CHF, DM2, rheumatoid arthritis, cervical spondylosis, lumbar DDD with chronic low back pain s/p neurostimulator, asthma and s/p ICD who presents for scalp paresthesias and vertigo.  History supplemented by referring provider's note.  In October, she started experiencing numbness and tingling over the right occipital region with throbbing pain.  She also notes soreness on the right side of her neck but no pain radiating down the arm.  Around the same time, she started experiencing dizziness, described as "everything coming in on me".  It occurs if she turns her head to the right or if she rolls over in bed on her right side.  It lasts for just a few seconds.  It occurs daily.  No nausea.  She already takes gabapentin 300mg  TID for chronic back pain.     She does have cervical spondylosis as demonstrated on MRI from 02/18/2016 involving C3-4 through C6-7 with bilateral foraminal stenosis at C4-5 and C5-6 but no spinal canal stenosis or cord abnormality.  PAST MEDICAL HISTORY: Past Medical History:  Diagnosis Date  . Anemia    anemia of chronic disease +/- IDA followed by Dr. Earlie Server. received iron infusions in the past  . Asthma   . Borderline glaucoma   . CAD (coronary artery disease)    a. s/p CABG 1996. b. low risk nuc 2011. c. LHC 04/2016 due to drop in EF -> occluded native LAD,  widely  patent sequential LIMA-D1-LAD.  Marland Kitchen Chronic kidney disease    "mild stage of kidney disease"  . Chronic systolic CHF (congestive heart failure) ([place])   . Complication of anesthesia   . DM2 (diabetes mellitus, type 2) ([place])    not on any medicine for this at this time, 05/2016  . Dyspnea    with exertion  . GERD (gastroesophageal reflux disease)   . Gout   . History of blood transfusion   . History of hiatal hernia    in 14s  . History of pneumonia    March, 2016  . HLD (hyperlipidemia)   . HTN (hypertension)   . Hyperthyroidism 01/21/2016  . Inappropriate sinus tachycardia   . LBBB (left bundle branch block)    a. Seen in 04/2016  . 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 Macular degeneration    left  . Myocardial infarction ([place])    1996  . Neuropathy   . Neuropathy   . NICM (nonischemic cardiomyopathy) ([place])    a. Dx 04/2016 - EF 25-30%, diffuse HK, elevated LVEDP, mild MR, mod LAE.  Marland Kitchen NSVT (nonsustained ventricular tachycardia) ([place])   . PAT (paroxysmal atrial tachycardia) ([place])   . Pneumonia   . PONV (postoperative nausea and vomiting)    "after just about every surgery I've had"  . Rheumatoid arthritis ([facility])    RA- hands  . Seasonal allergies     PAST SURGICAL HISTORY: Past Surgical History:  Procedure Laterality Date  .  ABDOMINAL HYSTERECTOMY    . APPENDECTOMY    . BACK SURGERY    . BIOPSY  04/27/2020   Procedure: BIOPSY;  Surgeon: Daneil Dolin, MD;  [facility];  Service: Endoscopy;;  ascending colon biopsy  . CARDIAC CATHETERIZATION N/A 05/01/2016   Procedure: Right/Left Heart Cath and Coronary/Graft Angiography;  Surgeon: Leonie Man, MD;  Location: [facility];  Service: Cardiovascular;  Laterality: N/A;  . CHOLECYSTECTOMY    . COLONOSCOPY  11/2011   Dr. Magod:ext/int hemorrhoids, diverticulosis sigmoid colon and distal desc colon, mid-desc colon hyperplastic polyps.   . COLONOSCOPY WITH PROPOFOL N/A 04/27/2020   Rourk: Diverticulosis, hemorrhoids,  normal terminal ileum.  Random colon biopsies significant for chronic lymphocytic colitis  . CORONARY ARTERY BYPASS GRAFT  1996   2 vessels  . ESOPHAGOGASTRODUODENOSCOPY  11/2011   Dr. Watt Climes: small hiatal hernia, one non-bleeding superficial gastric ulcer, medium-sized diverticulum in area of papilla  . ESOPHAGOGASTRODUODENOSCOPY N/A 12/29/2015   Dr. Gala Romney: Chronic inactive gastritis, normal esophagus status post dilation, duodenal diverticula  . ESOPHAGOGASTRODUODENOSCOPY (EGD) WITH PROPOFOL N/A 07/24/2019   Dr. Gala Romney: Normal esophagus status post empiric dilation, small hiatal hernia, large D2 diverticulum  . EYE SURGERY Bilateral    cataract removal  . FOOT SURGERY     removal bone spur  . FRACTURE SURGERY Right    arm  . GIVENS CAPSULE STUDY  11/2012   Dr. Watt Climes: minimal gastritis, normal small bowel capsule  . HERNIA REPAIR     hiatal hernia  . ICD IMPLANT N/A 02/04/2021   Procedure: ICD IMPLANT;  Surgeon: Evans Lance, MD;  Location: [facility];  Service: Cardiovascular;  Laterality: N/A;  . Venia Minks DILATION N/A 07/24/2019   Procedure: Venia Minks DILATION;  Surgeon: Daneil Dolin, MD;  [facility];  Service: Endoscopy;  Laterality: N/A;  . MAXIMUM ACCESS (MAS)POSTERIOR LUMBAR INTERBODY FUSION (PLIF) 1 LEVEL N/A 08/14/2013   Procedure:  MAXIMUM ACCESS SURGERY(MAS) POSTERIOR LUMBAR INTERBODY FUSION LUMBAR THREE-FOUR ;  Surgeon: Eustace Moore, MD;  Location: [facility];  Service: Neurosurgery;  Laterality: N/A;   MAXIMUM ACCESS SURGERY(MAS) POSTERIOR LUMBAR INTERBODY FUSION LUMBAR THREE-FOUR   . PTCA    . RIGHT/LEFT HEART CATH AND CORONARY ANGIOGRAPHY N/A 03/26/2020   Procedure: RIGHT/LEFT HEART CATH AND CORONARY ANGIOGRAPHY;  Surgeon: Leonie Man, MD;  Location: [facility];  Service: Cardiovascular;  Laterality: N/A;  . SPINAL CORD STIMULATOR BATTERY EXCHANGE N/A 01/19/2021   Procedure: Spinal cord stimulator battery change;  Surgeon: Reece Agar, MD;   Location: [facility];  Service: Neurosurgery;  Laterality: N/A;  . SPINAL CORD STIMULATOR INSERTION N/A 06/16/2016   Procedure: LUMBAR SPINAL CORD STIMULATOR INSERTION;  Surgeon: Clydell Hakim, MD;  Location: [facility];  Service: Neurosurgery;  Laterality: N/A;  LUMBAR SPINAL CORD STIMULATOR INSERTION  . tens  05/2016  . TONSILLECTOMY      MEDICATIONS: Current Outpatient Medications on File Prior to Visit  Medication Sig Dispense Refill  . albuterol (PROVENTIL HFA;VENTOLIN HFA) 108 (90 Base) MCG/ACT inhaler Take 2 puffs 3 times a day and every 4 hours as needed. (Patient taking differently: Inhale 2 puffs into the lungs every 4 (four) hours as needed for wheezing or shortness of breath.)    . allopurinol (ZYLOPRIM) 300 MG tablet Take 300 mg by mouth daily.    Marland Kitchen aspirin EC 81 MG EC tablet Take 1 tablet (81 mg total) by mouth daily.    Marland Kitchen atorvastatin (  LIPITOR) 80 MG tablet Take 80 mg by mouth daily.    . budesonide (ENTOCORT EC) 3 MG 24 hr capsule Take 6 mg by mouth daily. (Patient not taking: Reported on 03/07/2021) 90 capsule 1  . calcium carbonate (TUMS - DOSED IN MG ELEMENTAL CALCIUM) 500 MG chewable tablet Chew 1 tablet by mouth as needed for indigestion or heartburn.    . Carboxymethylcellulose Sodium (THERATEARS OP) Place 1 drop into both eyes 3 (three) times daily as needed (dry eyes).    . carvedilol (COREG) 6.25 MG tablet Take 6.25 mg by mouth 2 (two) times daily with a meal.    . cholecalciferol (VITAMIN D3) 25 MCG (1000 UT) tablet Take 1,000 Units by mouth daily.    Marland Kitchen esomeprazole (NEXIUM) 40 MG capsule Take 1 capsule (40 mg total) by mouth 2 (two) times daily before a meal. 60 capsule 3  . fexofenadine (ALLEGRA) 180 MG tablet Take 180 mg by mouth daily.    . fluticasone (FLONASE) 50 MCG/ACT nasal spray Place 2 sprays into both nostrils daily as needed for allergies.    . furosemide (LASIX) 40 MG tablet Take 40 mg by mouth 2 (two) times daily.     Marland Kitchen gabapentin (NEURONTIN) 300 MG capsule  Take 300 mg by mouth 3 (three) times daily.     Marland Kitchen HYDROcodone-acetaminophen (NORCO) 10-325 MG tablet Take 1 tablet by mouth every 6 (six) hours as needed for moderate pain.    . iron polysaccharides (NIFEREX) 150 MG capsule Take 150 mg by mouth daily.    . Lancets (ONETOUCH DELICA PLUS [id number]) [place]     . leflunomide (ARAVA) 20 MG tablet Take 20 mg by mouth daily.    Marland Kitchen losartan (COZAAR) 100 MG tablet Take 100 mg by mouth daily.    Marland Kitchen MAGNESIUM-OXIDE 400 (241.3 Mg) MG tablet TAKE 1 TABLET(400 MG) BY MOUTH DAILY (Patient taking differently: Take 400 mg by mouth daily.) 30 tablet 6  . meclizine (ANTIVERT) 12.5 MG tablet Take 12.5 mg by mouth 3 (three) times daily as needed for dizziness.    . metFORMIN (GLUCOPHAGE) 500 MG tablet Take 500 mg by mouth in the morning and at bedtime.     . montelukast (SINGULAIR) 10 MG tablet Take 10 mg by mouth at bedtime.    . Multiple Vitamins-Minerals (OCUVITE ADULT 50+ PO) Take 1 tablet by mouth daily.    . nitroGLYCERIN (NITROSTAT) 0.4 MG SL tablet Place 1 tablet (0.4 mg total) under the tongue every 5 (five) minutes as needed for chest pain. Reported on 03/24/2016 25 tablet 3  . oxyCODONE (OXY IR/ROXICODONE) 5 MG immediate release tablet Take 5 mg by mouth every 6 (six) hours as needed for severe pain.    Vladimir Faster Glycol-Propyl Glycol (SYSTANE OP) Apply 1 drop to eye 3 (three) times daily as needed (Dry Eyes).    . potassium chloride SA (K-DUR,KLOR-CON) 20 MEQ tablet Take 1 tablet (20 mEq total) by mouth 2 (two) times daily. 40 tablet 11  . triamcinolone cream (KENALOG) 0.1 % Apply 1 application topically daily as needed (for irritation).     Current Facility-Administered Medications on File Prior to Visit  Medication Dose Route Frequency Provider Last Rate Last Admin  . sodium chloride flush (NS) 0.9 % injection 3 mL  3 mL Intravenous Q12H Imogene Burn, PA-C        ALLERGIES: Clarithromycin Penicilin  FAMILY HISTORY: Family History  Problem  Relation Age of Onset  . Heart attack Father   .  Heart attack Mother   . Bladder Cancer Brother   . Cervical cancer Daughter        ?  . Colon cancer Neg Hx     Objective:  Blood pressure 102/87, pulse (!) 102, resp. rate 20, height 5\' 2"  (1.575 m), weight 147 lb (66.7 kg), SpO2 95 %. General: No acute distress.  Patient appears well-groomed.   Head:  Normocephalic/atraumatic Eyes:  fundi examined but not visualized Neck: supple, no paraspinal tenderness, full range of motion Back: No paraspinal tenderness Heart: regular rate and rhythm Lungs: Clear to auscultation bilaterally. Vascular: No carotid bruits. Neurological Exam: Mental status: alert and oriented to person, place, and time, peech fluent and not dysarthric, language intact. Cranial nerves: CN I: not tested CN II: pupils equal, round and reactive to light, visual fields intact CN III, IV, VI:  full range of motion, no nystagmus, no ptosis CN V: facial sensation intact. CN VII: upper and lower face symmetric CN VIII: hearing intact CN IX, X: gag intact, uvula midline CN XI: sternocleidomastoid and trapezius muscles intact CN XII: tongue midline Bulk & Tone: normal, no fasciculations. Motor:  muscle strength 5/5 throughout Sensation:  Pinprick, temperature and vibratory sensation intact. Deep Tendon Reflexes:  1+ throughout,  toes downgoing.   Finger to nose testing:  Without dysmetria.   Heel to shin:  Without dysmetria.   Gait:  Cautious gait.  Romberg positive.    Thank you for allowing me to take part in the care of this patient.  Metta Clines, DO  CC:  Harlan Stains, MD

## 2021-04-14 ENCOUNTER — Other Ambulatory Visit: Payer: Self-pay

## 2021-04-14 ENCOUNTER — Encounter: Payer: Self-pay | Admitting: Neurology

## 2021-04-14 ENCOUNTER — Ambulatory Visit: Payer: Medicare Other | Admitting: Neurology

## 2021-04-14 VITALS — BP 102/87 | HR 102 | Resp 20 | Ht 62.0 in | Wt 147.0 lb

## 2021-04-14 DIAGNOSIS — M47812 Spondylosis without myelopathy or radiculopathy, cervical region: Secondary | ICD-10-CM

## 2021-04-14 DIAGNOSIS — H81391 Other peripheral vertigo, right ear: Secondary | ICD-10-CM

## 2021-04-14 DIAGNOSIS — M5481 Occipital neuralgia: Secondary | ICD-10-CM

## 2021-04-14 MED ORDER — GABAPENTIN 400 MG PO CAPS: 400.0000 mg | ORAL_CAPSULE | Freq: Three times a day (TID) | ORAL | 5 refills | Status: DC

## 2021-04-14 NOTE — Patient Instructions (Signed)
The headache is likely occipital neuralgia.  I will increase gabapentin to 400mg  three times daily The dizziness is likely due to an inner ear problem or due to arthritis in the neck.  Consider physical therapy for dizziness Follow up 6 months.

## 2021-04-15 ENCOUNTER — Telehealth: Payer: Self-pay | Admitting: Internal Medicine

## 2021-04-15 NOTE — Telephone Encounter (Signed)
Pt called stating she received a my chart message concerning her Nitro- she's not sure what it's about   Please call pt

## 2021-04-15 NOTE — Telephone Encounter (Signed)
Left message to return call 

## 2021-04-19 NOTE — Telephone Encounter (Signed)
Spoke the pt who states that she doesn't recall what the msg said and she is unable to pull it back up. Pt states that she just refill her Nitro 3 weeks ago. Pt will call back if she remembers what the msg said.

## 2021-04-20 DIAGNOSIS — M4722 Other spondylosis with radiculopathy, cervical region: Secondary | ICD-10-CM | POA: Diagnosis not present

## 2021-04-20 DIAGNOSIS — Z9689 Presence of other specified functional implants: Secondary | ICD-10-CM | POA: Diagnosis not present

## 2021-04-20 DIAGNOSIS — M47816 Spondylosis without myelopathy or radiculopathy, lumbar region: Secondary | ICD-10-CM | POA: Diagnosis not present

## 2021-04-20 DIAGNOSIS — M546 Pain in thoracic spine: Secondary | ICD-10-CM | POA: Diagnosis not present

## 2021-04-25 NOTE — Telephone Encounter (Signed)
Called pt, LMOVM to call back to schedule 

## 2021-04-27 NOTE — Telephone Encounter (Signed)
Letter mailed

## 2021-05-02 NOTE — Telephone Encounter (Signed)
Pt received letter to schedule procedure. Informed her Dr. Roseanne Kaufman July schedule is now full. Will call her when next schedule is available.

## 2021-05-06 ENCOUNTER — Ambulatory Visit (INDEPENDENT_AMBULATORY_CARE_PROVIDER_SITE_OTHER): Payer: Medicare Other

## 2021-05-06 ENCOUNTER — Telehealth: Payer: Self-pay

## 2021-05-06 DIAGNOSIS — I5042 Chronic combined systolic (congestive) and diastolic (congestive) heart failure: Secondary | ICD-10-CM

## 2021-05-06 LAB — CUP PACEART REMOTE DEVICE CHECK
Battery Remaining Longevity: 110 mo
Battery Remaining Percentage: 95 %
Battery Voltage: 3.07 V
Brady Statistic RV Percent Paced: 0 %
Date Time Interrogation Session: 20220617020007
HighPow Impedance: 57 Ohm
Implantable Lead Implant Date: 20220318
Implantable Lead Location: 753860
Implantable Pulse Generator Implant Date: 20220318
Lead Channel Impedance Value: 490 Ohm
Lead Channel Pacing Threshold Amplitude: 0.5 V
Lead Channel Pacing Threshold Pulse Width: 0.5 ms
Lead Channel Sensing Intrinsic Amplitude: 12 mV
Lead Channel Setting Pacing Amplitude: 3.5 V
Lead Channel Setting Pacing Pulse Width: 0.5 ms
Lead Channel Setting Sensing Sensitivity: 0.5 mV
Pulse Gen Serial Number: 810021948

## 2021-05-06 NOTE — Telephone Encounter (Signed)
Pre-op appt 06/03/21 at 9:00am. Appt letter mailed with procedure instructions.

## 2021-05-06 NOTE — Telephone Encounter (Signed)
Called pt, EGD/-/+DIL w/Propofol w/Dr. Gala Romney ASA 3 scheduled for 06/03/21 at 9:00am. Orders entered.   PA for EGD/DIL submitted via [facility] website. PA# [id number], valid 06/03/21-09/01/21.

## 2021-05-10 ENCOUNTER — Other Ambulatory Visit: Payer: Self-pay

## 2021-05-10 ENCOUNTER — Encounter: Payer: Self-pay | Admitting: Internal Medicine

## 2021-05-10 ENCOUNTER — Ambulatory Visit: Payer: Medicare Other | Admitting: Internal Medicine

## 2021-05-10 VITALS — BP 114/62 | HR 102 | Ht 62.5 in | Wt 146.4 lb

## 2021-05-10 DIAGNOSIS — I428 Other cardiomyopathies: Secondary | ICD-10-CM

## 2021-05-10 NOTE — Patient Instructions (Signed)
Medication Instructions:  Your physician recommends that you continue on your current medications as directed. Please refer to the Current Medication list given to you today.  *If you need a refill on your cardiac medications before your next appointment, please call your pharmacy*   Lab Work: None today  If you have labs (blood work) drawn today and your tests are completely normal, you will receive your results only by: [place] (if you have MyChart) OR A paper copy in the mail If you have any lab test that is abnormal or we need to change your treatment, we will call you to review the results.   Testing/Procedures: None today    Follow-Up: At [facility], you and your health needs are our priority.  As part of our continuing mission to provide you with exceptional heart care, we have created designated Provider Care Teams.  These Care Teams include your primary Cardiologist (physician) and Advanced Practice Providers (APPs -  Physician Assistants and Nurse Practitioners) who all work together to provide you with the care you need, when you need it.  We recommend signing up for the patient portal called "MyChart".  Sign up information is provided on this After Visit Summary.  MyChart is used to connect with patients for Virtual Visits (Telemedicine).  Patients are able to view lab/test results, encounter notes, upcoming appointments, etc.  Non-urgent messages can be sent to your provider as well.   To learn more about what you can do with MyChart, go to [email or web address].    Your next appointment:   1 year(s)  The format for your next appointment:   In Person  Provider:   Cristopher Peru, MD   Other Instructions None

## 2021-05-10 NOTE — Progress Notes (Signed)
HPI Katherine Larson returns today for followup. She is a pleasant 75 yo woman with a h/o HFrEF, on maximal medical therapy, who I saw almost 4 years ago for possible ICD insertion. Her EF has improved and we held off. She developed worsening dyspnea about a year ago and her meds have been adjusted but her EF remains down. She has not had syncope. She is pending a spinal stimulator change out. She has undergone ICD insertion and has done well.     Current Outpatient Medications  Medication Sig Dispense Refill   albuterol (PROVENTIL HFA;VENTOLIN HFA) 108 (90 Base) MCG/ACT inhaler Take 2 puffs 3 times a day and every 4 hours as needed. (Patient taking differently: Inhale 2 puffs into the lungs every 4 (four) hours as needed for wheezing or shortness of breath.)     allopurinol (ZYLOPRIM) 300 MG tablet Take 300 mg by mouth daily.     aspirin EC 81 MG EC tablet Take 1 tablet (81 mg total) by mouth daily.     atorvastatin (LIPITOR) 80 MG tablet Take 80 mg by mouth daily.     budesonide (ENTOCORT EC) 3 MG 24 hr capsule Take 6 mg by mouth daily. 90 capsule 1   calcium carbonate (TUMS - DOSED IN MG ELEMENTAL CALCIUM) 500 MG chewable tablet Chew 1 tablet by mouth as needed for indigestion or heartburn.     Carboxymethylcellulose Sodium (THERATEARS OP) Place 1 drop into both eyes 3 (three) times daily as needed (dry eyes).     carvedilol (COREG) 6.25 MG tablet Take 6.25 mg by mouth 2 (two) times daily with a meal.     cholecalciferol (VITAMIN D3) 25 MCG (1000 UT) tablet Take 1,000 Units by mouth daily.     esomeprazole (NEXIUM) 40 MG capsule Take 1 capsule (40 mg total) by mouth 2 (two) times daily before a meal. 60 capsule 3   fexofenadine (ALLEGRA) 180 MG tablet Take 180 mg by mouth daily.     fluticasone (FLONASE) 50 MCG/ACT nasal spray Place 2 sprays into both nostrils daily as needed for allergies.     furosemide (LASIX) 40 MG tablet Take 40 mg by mouth 2 (two) times daily.      gabapentin  (NEURONTIN) 400 MG capsule Take 1 capsule (400 mg total) by mouth 3 (three) times daily. 90 capsule 5   HYDROcodone-acetaminophen (NORCO) 10-325 MG tablet Take 1 tablet by mouth every 6 (six) hours as needed for moderate pain.     iron polysaccharides (NIFEREX) 150 MG capsule Take 150 mg by mouth daily.     Lancets (ONETOUCH DELICA PLUS [id number]) MISC      leflunomide (ARAVA) 20 MG tablet Take 20 mg by mouth daily.     losartan (COZAAR) 100 MG tablet Take 100 mg by mouth daily.     MAGNESIUM-OXIDE 400 (241.3 Mg) MG tablet TAKE 1 TABLET(400 MG) BY MOUTH DAILY (Patient taking differently: Take 400 mg by mouth daily.) 30 tablet 6   meclizine (ANTIVERT) 12.5 MG tablet Take 12.5 mg by mouth 3 (three) times daily as needed for dizziness.     metFORMIN (GLUCOPHAGE) 500 MG tablet Take 500 mg by mouth in the morning and at bedtime.      montelukast (SINGULAIR) 10 MG tablet Take 10 mg by mouth at bedtime.     Multiple Vitamins-Minerals (OCUVITE ADULT 50+ PO) Take 1 tablet by mouth daily.     nitroGLYCERIN (NITROSTAT) 0.4 MG SL tablet Place 1 tablet (0.4 mg  total) under the tongue every 5 (five) minutes as needed for chest pain. Reported on 03/24/2016 25 tablet 3   Polyethyl Glycol-Propyl Glycol (SYSTANE OP) Apply 1 drop to eye 3 (three) times daily as needed (Dry Eyes).     potassium chloride SA (K-DUR,KLOR-CON) 20 MEQ tablet Take 1 tablet (20 mEq total) by mouth 2 (two) times daily. 40 tablet 11   triamcinolone cream (KENALOG) 0.1 % Apply 1 application topically daily as needed (for irritation).     Current Facility-Administered Medications  Medication Dose Route Frequency Provider Last Rate Last Admin   sodium chloride flush (NS) 0.9 % injection 3 mL  3 mL Intravenous Q12H Imogene Burn, PA-C         Past Medical History:  Diagnosis Date   Anemia    anemia of chronic disease +/- IDA followed by Dr. Earlie Server. received iron infusions in the past   Asthma    Borderline glaucoma    CAD (coronary  artery disease)    a. s/p CABG 1996. b. low risk nuc 2011. c. LHC 04/2016 due to drop in EF -> occluded native LAD,  widely patent sequential LIMA-D1-LAD.   Chronic kidney disease    "mild stage of kidney disease"   Chronic systolic CHF (congestive heart failure) (HCC)    Complication of anesthesia    DM2 (diabetes mellitus, type 2) ([place])    not on any medicine for this at this time, 05/2016   Dyspnea    with exertion   GERD (gastroesophageal reflux disease)    Gout    History of blood transfusion    History of hiatal hernia    in 20s   History of pneumonia    March, 2016   HLD (hyperlipidemia)    HTN (hypertension)    Hyperthyroidism 01/21/2016   Inappropriate sinus tachycardia    LBBB (left bundle branch block)    a. Seen in 04/2016   Lymphocytic colitis 04/2020   Macular degeneration    left   Myocardial infarction [facility])    1996   Neuropathy    Neuropathy    NICM (nonischemic cardiomyopathy) ([place])    a. Dx 04/2016 - EF 25-30%, diffuse HK, elevated LVEDP, mild MR, mod LAE.   NSVT (nonsustained ventricular tachycardia) (HCC)    PAT (paroxysmal atrial tachycardia) (HCC)    Pneumonia    PONV (postoperative nausea and vomiting)    "after just about every surgery I've had"   Rheumatoid arthritis ([place])    RA- hands   Seasonal allergies     ROS:   All systems reviewed and negative except as noted in the HPI.   Past Surgical History:  Procedure Laterality Date   ABDOMINAL HYSTERECTOMY     APPENDECTOMY     BACK SURGERY     BIOPSY  04/27/2020   Procedure: BIOPSY;  Surgeon: Daneil Dolin, MD;  [facility];  Service: Endoscopy;;  ascending colon biopsy   CARDIAC CATHETERIZATION N/A 05/01/2016   Procedure: Right/Left Heart Cath and Coronary/Graft Angiography;  Surgeon: Leonie Man, MD;  Location: [place] [facility];  Service: Cardiovascular;  Laterality: N/A;   CHOLECYSTECTOMY     COLONOSCOPY  11/2011   Dr. Magod:ext/int hemorrhoids, diverticulosis sigmoid colon  and distal desc colon, mid-desc colon hyperplastic polyps.    COLONOSCOPY WITH PROPOFOL N/A 04/27/2020   Rourk: Diverticulosis, hemorrhoids, normal terminal ileum.  Random colon biopsies significant for chronic lymphocytic colitis   CORONARY ARTERY BYPASS GRAFT  1996  2 vessels   ESOPHAGOGASTRODUODENOSCOPY  11/2011   Dr. Watt Climes: small hiatal hernia, one non-bleeding superficial gastric ulcer, medium-sized diverticulum in area of papilla   ESOPHAGOGASTRODUODENOSCOPY N/A 12/29/2015   Dr. Gala Romney: Chronic inactive gastritis, normal esophagus status post dilation, duodenal diverticula   ESOPHAGOGASTRODUODENOSCOPY (EGD) WITH PROPOFOL N/A 07/24/2019   Dr. Gala Romney: Normal esophagus status post empiric dilation, small hiatal hernia, large D2 diverticulum   EYE SURGERY Bilateral    cataract removal   FOOT SURGERY     removal bone spur   FRACTURE SURGERY Right    arm   GIVENS CAPSULE STUDY  11/2012   Dr. Watt Climes: minimal gastritis, normal small bowel capsule   HERNIA REPAIR     hiatal hernia   ICD IMPLANT N/A 02/04/2021   Procedure: ICD IMPLANT;  Surgeon: Evans Lance, MD;  Location: [place] [facility];  Service: Cardiovascular;  Laterality: N/A;   MALONEY DILATION N/A 07/24/2019   Procedure: Venia Minks DILATION;  Surgeon: Daneil Dolin, MD;  [facility];  Service: Endoscopy;  Laterality: N/A;   MAXIMUM ACCESS (MAS)POSTERIOR LUMBAR INTERBODY FUSION (PLIF) 1 LEVEL N/A 08/14/2013   Procedure:  MAXIMUM ACCESS SURGERY(MAS) POSTERIOR LUMBAR INTERBODY FUSION LUMBAR THREE-FOUR ;  Surgeon: Eustace Moore, MD;  Location: [place] [facility];  Service: Neurosurgery;  Laterality: N/A;   MAXIMUM ACCESS SURGERY(MAS) POSTERIOR LUMBAR INTERBODY FUSION LUMBAR THREE-FOUR    PTCA     RIGHT/LEFT HEART CATH AND CORONARY ANGIOGRAPHY N/A 03/26/2020   Procedure: RIGHT/LEFT HEART CATH AND CORONARY ANGIOGRAPHY;  Surgeon: Leonie Man, MD;  Location: [place] [facility];  Service: Cardiovascular;  Laterality: N/A;   SPINAL CORD  STIMULATOR BATTERY EXCHANGE N/A 01/19/2021   Procedure: Spinal cord stimulator battery change;  Surgeon: Reece Agar, MD;  Location: [place];  Service: Neurosurgery;  Laterality: N/A;   SPINAL CORD STIMULATOR INSERTION N/A 06/16/2016   Procedure: LUMBAR SPINAL CORD STIMULATOR INSERTION;  Surgeon: Clydell Hakim, MD;  Location: [place] [facility];  Service: Neurosurgery;  Laterality: N/A;  LUMBAR SPINAL CORD STIMULATOR INSERTION   tens  05/2016   TONSILLECTOMY       Family History  Problem Relation Age of Onset   Heart attack Father    Heart attack Mother    Bladder Cancer Brother    Cervical cancer Daughter        ?   Colon cancer Neg Hx      Social History   Socioeconomic History   Marital status: Married    Spouse name: Not on file   Number of children: 3   Years of education: Not on file   Highest education level: Not on file  Occupational History   Not on file  Tobacco Use   Smoking status: Former    Packs/day: 0.75    Years: 15.00    Pack years: 11.25    Types: Cigarettes    Quit date: 11/20/1994    Years since quitting: 26.4   Smokeless tobacco: Never   Tobacco comments:    Quit in 1996  Vaping Use   Vaping Use: Never used  Substance and Sexual Activity   Alcohol use: Never    Alcohol/week: 0.0 standard drinks   Drug use: Never   Sexual activity: Yes  Other Topics Concern   Not on file  Social History Narrative   2 living children, one deceased age 25   Right handed   One story home   Drinks coffee am   Social Determinants of Health  Financial Resource Strain: Not on file  Food Insecurity: Not on file  Transportation Needs: Not on file  Physical Activity: Not on file  Stress: Not on file  Social Connections: Not on file  Intimate Partner Violence: Not on file     BP 114/62   Pulse (!) 102   Ht 5' 2.5" (1.588 m)   Wt 146 lb 6.4 oz (66.4 kg)   SpO2 94%   BMI 26.35 kg/m   Physical Exam:  Well appearing NAD HEENT: Unremarkable Neck:  No JVD, no  thyromegally Lymphatics:  No adenopathy Back:  No CVA tenderness Lungs:  Clear HEART:  Regular rate rhythm, no murmurs, no rubs, no clicks Abd:  soft, positive bowel sounds, no organomegally, no rebound, no guarding Ext:  2 plus pulses, no edema, no cyanosis, no clubbing Skin:  No rashes no nodules Neuro:  CN II through XII intact, motor grossly intact   DEVICE  Normal device function.  See PaceArt for details.   Assess/Plan:  1. Chronic systolic heart failure - her symptoms are class 2 and her EF is 30% despite maximal medical therapy.  2. Atrial tachy - she denies palpitations. Continue her coreg. 3. ICD - she is s/p insertion of a [employer]. Jude single chamber ICD. Her device is working normally. 4. Dyslipidemia - she will continue her statin therapy with lipitor.    Carleene Overlie Michalle Rademaker,MD

## 2021-05-11 DIAGNOSIS — J4540 Moderate persistent asthma, uncomplicated: Secondary | ICD-10-CM | POA: Diagnosis not present

## 2021-05-11 DIAGNOSIS — I129 Hypertensive chronic kidney disease with stage 1 through stage 4 chronic kidney disease, or unspecified chronic kidney disease: Secondary | ICD-10-CM | POA: Diagnosis not present

## 2021-05-11 DIAGNOSIS — E1140 Type 2 diabetes mellitus with diabetic neuropathy, unspecified: Secondary | ICD-10-CM | POA: Diagnosis not present

## 2021-05-11 DIAGNOSIS — M069 Rheumatoid arthritis, unspecified: Secondary | ICD-10-CM | POA: Diagnosis not present

## 2021-05-11 DIAGNOSIS — I5022 Chronic systolic (congestive) heart failure: Secondary | ICD-10-CM | POA: Diagnosis not present

## 2021-05-11 DIAGNOSIS — D509 Iron deficiency anemia, unspecified: Secondary | ICD-10-CM | POA: Diagnosis not present

## 2021-05-11 DIAGNOSIS — N183 Chronic kidney disease, stage 3 unspecified: Secondary | ICD-10-CM | POA: Diagnosis not present

## 2021-05-11 DIAGNOSIS — E785 Hyperlipidemia, unspecified: Secondary | ICD-10-CM | POA: Diagnosis not present

## 2021-05-11 DIAGNOSIS — J4520 Mild intermittent asthma, uncomplicated: Secondary | ICD-10-CM | POA: Diagnosis not present

## 2021-05-11 DIAGNOSIS — I2510 Atherosclerotic heart disease of native coronary artery without angina pectoris: Secondary | ICD-10-CM | POA: Diagnosis not present

## 2021-05-11 DIAGNOSIS — E1121 Type 2 diabetes mellitus with diabetic nephropathy: Secondary | ICD-10-CM | POA: Diagnosis not present

## 2021-05-16 ENCOUNTER — Other Ambulatory Visit: Payer: Self-pay | Admitting: Internal Medicine

## 2021-05-20 ENCOUNTER — Ambulatory Visit: Payer: Medicare Other | Admitting: Gastroenterology

## 2021-05-25 NOTE — Progress Notes (Signed)
Remote ICD transmission.   

## 2021-05-30 ENCOUNTER — Encounter: Payer: Self-pay | Admitting: Internal Medicine

## 2021-05-30 NOTE — Progress Notes (Signed)
Pre-procedure device clearance completed and faxed to [facility].  See scanned media for details.

## 2021-05-30 NOTE — Patient Instructions (Addendum)
Katherine Larson  05/30/2021     @PREFPERIOPPHARMACY @   Your procedure is scheduled on  06/03/2021.   Report to [name] at  0700  A.M.  Call this number if you have problems the morning of surgery:  [phone]   Remember:  Follow the diet and prep instructions given to you by the office.    Take these medicines the morning of surgery with A SIP OF WATER    allopurinol, carvedilol, allegra, gabapentin, hydrocodone(if needed), antivert (if needed).    Use your inhaler before you come and bring your rescue inhaler with you.    Do not wear jewelry, make-up or nail polish.  Do not wear lotions, powders, or perfumes, or deodorant.  Do not shave 48 hours prior to surgery.  Men may shave face and neck.  Do not bring valuables to the hospital.  [facility] is not responsible for any belongings or valuables.  Contacts, dentures or bridgework may not be worn into surgery.  Leave your suitcase in the car.  After surgery it may be brought to your room.  For patients admitted to the hospital, discharge time will be determined by your treatment team.  Patients discharged the day of surgery will not be allowed to drive home and must have some one with them for 24 hours.    Special instructions:   DO NOT smoke tobacco or vape for 24 hours before your procedure.  Please read over the following fact sheets that you were given. Anesthesia Post-op Instructions and Care and Recovery After Surgery      Upper Endoscopy, Adult, Care After This sheet gives you information about how to care for yourself after your procedure. Your health care provider may also give you more specific instructions. If you have problems or questions, contact your health careprovider. What can I expect after the procedure? After the procedure, it is common to have: A sore throat. Mild stomach pain or discomfort. Bloating. Nausea. Follow these instructions at home:  Follow instructions from your health  care provider about what to eat or drink after your procedure. Return to your normal activities as told by your health care provider. Ask your health care provider what activities are safe for you. Take over-the-counter and prescription medicines only as told by your health care provider. If you were given a sedative during the procedure, it can affect you for several hours. Do not drive or operate machinery until your health care provider says that it is safe. Keep all follow-up visits as told by your health care provider. This is important. Contact a health care provider if you have: A sore throat that lasts longer than one day. Trouble swallowing. Get help right away if: You vomit blood or your vomit looks like coffee grounds. You have: A fever. Bloody, black, or tarry stools. A severe sore throat or you cannot swallow. Difficulty breathing. Severe pain in your chest or abdomen. Summary After the procedure, it is common to have a sore throat, mild stomach discomfort, bloating, and nausea. If you were given a sedative during the procedure, it can affect you for several hours. Do not drive or operate machinery until your health care provider says that it is safe. Follow instructions from your health care provider about what to eat or drink after your procedure. Return to your normal activities as told by your health care provider. This information is not intended to replace advice given to you by your health  care provider. Make sure you discuss any questions you have with your healthcare provider. Document Revised: 11/04/2019 Document Reviewed: 04/08/2018 Elsevier Patient Education  2022 [employer]. https://www.asge.org/home/for-patients/patient-information/understanding-eso-dilation-updated">  Esophageal Dilatation Esophageal dilatation, also called esophageal dilation, is a procedure to widen or open a blocked or narrowed part of the esophagus. The esophagus is the part of the body  that moves food and liquid from the mouth to the stomach. You may need this procedure if: You have a buildup of scar tissue in your esophagus that makes it difficult, painful, or impossible to swallow. This can be caused by gastroesophageal reflux disease (GERD). You have cancer of the esophagus. There is a problem with how food moves through your esophagus. In some cases, you may need this procedure repeated at a later time to dilatethe esophagus gradually. Tell a health care provider about: Any allergies you have. All medicines you are taking, including vitamins, herbs, eye drops, creams, and over-the-counter medicines. Any problems you or family members have had with anesthetic medicines. Any blood disorders you have. Any surgeries you have had. Any medical conditions you have. Any antibiotic medicines you are required to take before dental procedures. Whether you are pregnant or may be pregnant. What are the risks? Generally, this is a safe procedure. However, problems may occur, including: Bleeding due to a tear in the lining of the esophagus. A hole, or perforation, in the esophagus. What happens before the procedure? Ask your health care provider about: Changing or stopping your regular medicines. This is especially important if you are taking diabetes medicines or blood thinners. Taking medicines such as aspirin and ibuprofen. These medicines can thin your blood. Do not take these medicines unless your health care provider tells you to take them. Taking over-the-counter medicines, vitamins, herbs, and supplements. Follow instructions from your health care provider about eating or drinking restrictions. Plan to have a responsible adult take you home from the hospital or clinic. Plan to have a responsible adult care for you for the time you are told after you leave the hospital or clinic. This is important. What happens during the procedure? You may be given a medicine to help you  relax (sedative). A numbing medicine may be sprayed into the back of your throat, or you may gargle the medicine. Your health care provider may perform the dilatation using various surgical instruments, such as: Simple dilators. This instrument is carefully placed in the esophagus to stretch it. Guided wire bougies. This involves using an endoscope to insert a wire into the esophagus. A dilator is passed over this wire to enlarge the esophagus. Then the wire is removed. Balloon dilators. An endoscope with a small balloon is inserted into the esophagus. The balloon is inflated to stretch the esophagus and open it up. The procedure may vary among health care providers and hospitals. What can I expect after the procedure? Your blood pressure, heart rate, breathing rate, and blood oxygen level will be monitored until you leave the hospital or clinic. Your throat may feel slightly sore and numb. This will get better over time. You will not be allowed to eat or drink until your throat is no longer numb. When you are able to drink, urinate, and sit on the edge of the bed without nausea or dizziness, you may be able to return home. Follow these instructions at home: Take over-the-counter and prescription medicines only as told by your health care provider. If you were given a sedative during the procedure, it can affect  you for several hours. Do not drive or operate machinery until your health care provider says that it is safe. Plan to have a responsible adult care for you for the time you are told. This is important. Follow instructions from your health care provider about any eating or drinking restrictions. Do not use any products that contain nicotine or tobacco, such as cigarettes, e-cigarettes, and chewing tobacco. If you need help quitting, ask your health care provider. Keep all follow-up visits. This is important. Contact a health care provider if: You have a fever. You have pain that is not  relieved by medicine. Get help right away if: You have chest pain. You have trouble breathing. You have trouble swallowing. You vomit blood. You have black, tarry, or bloody stools. These symptoms may represent a serious problem that is an emergency. Do not wait to see if the symptoms will go away. Get medical help right away. Call your local emergency services (911 in the [place] Do not drive yourself to the hospital. Summary Esophageal dilatation, also called esophageal dilation, is a procedure to widen or open a blocked or narrowed part of the esophagus. Plan to have a responsible adult take you home from the hospital or clinic. For this procedure, a numbing medicine may be sprayed into the back of your throat, or you may gargle the medicine. Do not drive or operate machinery until your health care provider says that it is safe. This information is not intended to replace advice given to you by your health care provider. Make sure you discuss any questions you have with your healthcare provider. Document Revised: 03/24/2020 Document Reviewed: 03/24/2020 Elsevier Patient Education  [place] After This sheet gives you information about how to care for yourself after your procedure. Your health care provider may also give you more specific instructions. If you have problems or questions, contact your health careprovider. What can I expect after the procedure? After the procedure, it is common to have: Tiredness. Forgetfulness about what happened after the procedure. Impaired judgment for important decisions. Nausea or vomiting. Some difficulty with balance. Follow these instructions at home: For the time period you were told by your health care provider:     Rest as needed. Do not participate in activities where you could fall or become injured. Do not drive or use machinery. Do not drink alcohol. Do not take sleeping pills or medicines that  cause drowsiness. Do not make important decisions or sign legal documents. Do not take care of children on your own. Eating and drinking Follow the diet that is recommended by your health care provider. Drink enough fluid to keep your urine pale yellow. If you vomit: Drink water, juice, or soup when you can drink without vomiting. Make sure you have little or no nausea before eating solid foods. General instructions Have a responsible adult stay with you for the time you are told. It is important to have someone help care for you until you are awake and alert. Take over-the-counter and prescription medicines only as told by your health care provider. If you have sleep apnea, surgery and certain medicines can increase your risk for breathing problems. Follow instructions from your health care provider about wearing your sleep device: Anytime you are sleeping, including during daytime naps. While taking prescription pain medicines, sleeping medicines, or medicines that make you drowsy. Avoid smoking. Keep all follow-up visits as told by your health care provider. This is important. Contact a health  care provider if: You keep feeling nauseous or you keep vomiting. You feel light-headed. You are still sleepy or having trouble with balance after 24 hours. You develop a rash. You have a fever. You have redness or swelling around the IV site. Get help right away if: You have trouble breathing. You have new-onset confusion at home. Summary For several hours after your procedure, you may feel tired. You may also be forgetful and have poor judgment. Have a responsible adult stay with you for the time you are told. It is important to have someone help care for you until you are awake and alert. Rest as told. Do not drive or operate machinery. Do not drink alcohol or take sleeping pills. Get help right away if you have trouble breathing, or if you suddenly become confused. This information is not  intended to replace advice given to you by your health care provider. Make sure you discuss any questions you have with your healthcare provider. Document Revised: 07/22/2020 Document Reviewed: 10/09/2019 Elsevier Patient Education  2022 [employer].

## 2021-05-31 ENCOUNTER — Other Ambulatory Visit: Payer: Self-pay

## 2021-05-31 ENCOUNTER — Encounter (HOSPITAL_COMMUNITY): Payer: Self-pay

## 2021-05-31 ENCOUNTER — Encounter (HOSPITAL_COMMUNITY)
Admission: RE | Admit: 2021-05-31 | Discharge: 2021-05-31 | Disposition: A | Discharge status: Home / Self Care | Payer: Medicare Other | Source: Ambulatory Visit | Attending: Internal Medicine | Admitting: Internal Medicine

## 2021-05-31 DIAGNOSIS — Z01812 Encounter for preprocedural laboratory examination: Secondary | ICD-10-CM | POA: Insufficient documentation

## 2021-05-31 LAB — CBC WITH DIFFERENTIAL/PLATELET
Abs Immature Granulocytes: 0.05 10*3/uL (ref 0.00–0.07)
Basophils Absolute: 0 10*3/uL (ref 0.0–0.1)
Basophils Relative: 0 %
Eosinophils Absolute: 0.2 10*3/uL (ref 0.0–0.5)
Eosinophils Relative: 2 %
HCT: 31.7 % — ABNORMAL LOW (ref 36.0–46.0)
Hemoglobin: 10 g/dL — ABNORMAL LOW (ref 12.0–15.0)
Immature Granulocytes: 1 %
Lymphocytes Relative: 20 %
Lymphs Abs: 2.1 10*3/uL (ref 0.7–4.0)
MCH: 30.7 pg (ref 26.0–34.0)
MCHC: 31.5 g/dL (ref 30.0–36.0)
MCV: 97.2 fL (ref 80.0–100.0)
Monocytes Absolute: 0.8 10*3/uL (ref 0.1–1.0)
Monocytes Relative: 7 %
Neutro Abs: 7.6 10*3/uL (ref 1.7–7.7)
Neutrophils Relative %: 70 %
Platelets: 293 10*3/uL (ref 150–400)
RBC: 3.26 MIL/uL — ABNORMAL LOW (ref 3.87–5.11)
RDW: 14.9 % (ref 11.5–15.5)
WBC: 10.8 10*3/uL — ABNORMAL HIGH (ref 4.0–10.5)
nRBC: 0 % (ref 0.0–0.2)

## 2021-05-31 LAB — BASIC METABOLIC PANEL
Anion gap: 12 (ref 5–15)
BUN: 15 mg/dL (ref 8–23)
CO2: 28 mmol/L (ref 22–32)
Calcium: 10.3 mg/dL (ref 8.9–10.3)
Chloride: 96 mmol/L — ABNORMAL LOW (ref 98–111)
Creatinine, Ser: 0.83 mg/dL (ref 0.44–1.00)
GFR, Estimated: >60 mL/min (ref >60)
Glucose, Bld: 141 mg/dL — ABNORMAL HIGH (ref 70–99)
Potassium: 3.6 mmol/L (ref 3.5–5.1)
Sodium: 136 mmol/L (ref 135–145)

## 2021-05-31 NOTE — Pre-Procedure Instructions (Addendum)
Interoffice note from Dr Gala Romney stating he is okay to proceed with procedure on Friday.

## 2021-05-31 NOTE — Pre-Procedure Instructions (Signed)
Patient did not stop her iron on 05/27/2021 as instructed by office. Note sent to Dr Gala Romney to inform him of this.

## 2021-06-03 ENCOUNTER — Encounter (HOSPITAL_COMMUNITY): Payer: Self-pay | Admitting: Internal Medicine

## 2021-06-03 ENCOUNTER — Encounter (HOSPITAL_COMMUNITY)
Admission: RE | Disposition: A | Discharge status: Home / Self Care | Payer: Self-pay | Source: Ambulatory Visit | Attending: Internal Medicine

## 2021-06-03 ENCOUNTER — Ambulatory Visit (HOSPITAL_COMMUNITY): Payer: Medicare Other | Admitting: Certified Registered Nurse Anesthetist

## 2021-06-03 ENCOUNTER — Ambulatory Visit (HOSPITAL_COMMUNITY)
Admission: RE | Admit: 2021-06-03 | Discharge: 2021-06-03 | Disposition: A | Discharge status: Home / Self Care | Payer: Medicare Other | Source: Ambulatory Visit | Attending: Internal Medicine | Admitting: Internal Medicine

## 2021-06-03 DIAGNOSIS — Z7982 Long term (current) use of aspirin: Secondary | ICD-10-CM | POA: Diagnosis not present

## 2021-06-03 DIAGNOSIS — I509 Heart failure, unspecified: Secondary | ICD-10-CM | POA: Diagnosis not present

## 2021-06-03 DIAGNOSIS — Z951 Presence of aortocoronary bypass graft: Secondary | ICD-10-CM | POA: Insufficient documentation

## 2021-06-03 DIAGNOSIS — J392 Other diseases of pharynx: Secondary | ICD-10-CM | POA: Insufficient documentation

## 2021-06-03 DIAGNOSIS — Z87891 Personal history of nicotine dependence: Secondary | ICD-10-CM | POA: Diagnosis not present

## 2021-06-03 DIAGNOSIS — Z880 Allergy status to penicillin: Secondary | ICD-10-CM | POA: Diagnosis not present

## 2021-06-03 DIAGNOSIS — R1310 Dysphagia, unspecified: Secondary | ICD-10-CM | POA: Diagnosis not present

## 2021-06-03 DIAGNOSIS — Z7984 Long term (current) use of oral hypoglycemic drugs: Secondary | ICD-10-CM | POA: Insufficient documentation

## 2021-06-03 DIAGNOSIS — R933 Abnormal findings on diagnostic imaging of other parts of digestive tract: Secondary | ICD-10-CM | POA: Diagnosis not present

## 2021-06-03 DIAGNOSIS — N189 Chronic kidney disease, unspecified: Secondary | ICD-10-CM | POA: Diagnosis not present

## 2021-06-03 DIAGNOSIS — Z79899 Other long term (current) drug therapy: Secondary | ICD-10-CM | POA: Diagnosis not present

## 2021-06-03 DIAGNOSIS — Z881 Allergy status to other antibiotic agents status: Secondary | ICD-10-CM | POA: Insufficient documentation

## 2021-06-03 DIAGNOSIS — I130 Hypertensive heart and chronic kidney disease with heart failure and stage 1 through stage 4 chronic kidney disease, or unspecified chronic kidney disease: Secondary | ICD-10-CM | POA: Diagnosis not present

## 2021-06-03 HISTORY — PX: MALONEY DILATION: SHX5535

## 2021-06-03 HISTORY — PX: ESOPHAGOGASTRODUODENOSCOPY (EGD) WITH PROPOFOL: SHX5813

## 2021-06-03 LAB — GLUCOSE, CAPILLARY: Glucose-Capillary: 135 mg/dL — ABNORMAL HIGH (ref 70–99)

## 2021-06-03 SURGERY — ESOPHAGOGASTRODUODENOSCOPY (EGD) WITH PROPOFOL
Anesthesia: General

## 2021-06-03 MED ORDER — LIDOCAINE HCL (CARDIAC) PF 100 MG/5ML IV SOSY
PREFILLED_SYRINGE | INTRAVENOUS | Status: DC | PRN
  Administered 2021-06-03: 50 mg via INTRAVENOUS

## 2021-06-03 MED ORDER — PROPOFOL 10 MG/ML IV BOLUS
INTRAVENOUS | Status: DC | PRN
  Administered 2021-06-03: 20 mg via INTRAVENOUS
  Administered 2021-06-03: 90 mg via INTRAVENOUS
  Administered 2021-06-03: 20 mg via INTRAVENOUS

## 2021-06-03 MED ORDER — LACTATED RINGERS IV SOLN: INTRAVENOUS | Status: DC

## 2021-06-03 NOTE — Anesthesia Postprocedure Evaluation (Signed)
Anesthesia Post Note  Patient: Katherine Larson  Procedure(s) Performed: ESOPHAGOGASTRODUODENOSCOPY (EGD) WITH PROPOFOL [name]  Patient location during evaluation: Phase II Anesthesia Type: General Level of consciousness: awake Pain management: pain level controlled Vital Signs Assessment: post-procedure vital signs reviewed and stable Respiratory status: spontaneous breathing and respiratory function stable Cardiovascular status: blood pressure returned to baseline and stable Postop Assessment: no headache and no apparent nausea or vomiting Anesthetic complications: no Comments: Late entry   No notable events documented.   Last Vitals:  Vitals:   06/03/21 0830 06/03/21 0917  BP:  99/61  Pulse: 99 (!) 101  Resp: 20 20  Temp:  36.8 C  SpO2: 94% 99%    Last Pain:  Vitals:   06/03/21 0917  TempSrc: Oral  PainSc: 0-No pain                 Louann Sjogren

## 2021-06-03 NOTE — Discharge Instructions (Addendum)
Continue Dexilant 60 mg daily  The back of your throat did appear abnormal to me.  This is not in your esophagus.  It may be contributing to your swallowing difficulties.  As discussed, I recommend you see an ENT of physician for further evaluation  We will plan to see you back in the office for follow-up in 3 months  At patient request, I called Ross Ludwig at [phone] reviewed findings and recommendations

## 2021-06-03 NOTE — Transfer of Care (Signed)
Immediate Anesthesia Transfer of Care Note  Patient: Katherine Larson  Procedure(s) Performed: ESOPHAGOGASTRODUODENOSCOPY (EGD) WITH PROPOFOL MALONEY DILATION  Patient Location: Short Stay  Anesthesia Type:General  Level of Consciousness: awake, alert  and oriented  Airway & Oxygen Therapy: Patient Spontanous Breathing  Post-op Assessment: Report given to RN and Post -op Vital signs reviewed and stable  Post vital signs: Reviewed and stable  Last Vitals:  Vitals Value Taken Time  BP    Temp    Pulse    Resp    SpO2      Last Pain:  Vitals:   06/03/21 0901  PainSc: 2          Complications: No notable events documented.

## 2021-06-03 NOTE — H&P (Signed)
@LOGO @   Primary Care Physician:  Harlan Stains, MD Primary Gastroenterologist:  Dr.   Pre-Procedure History & Physical: HPI:  Katherine Larson is a 75 y.o. female here for further evaluation of ongoing dysphagia.  Patient describes something in her neck.  Recent barium pill esophagram demonstrated no impediment to passage of the barium pill however there was a hypopharyngeal impression that is new from prior studies.  It is notable that did not see anything in her prior EGD and passed a large bore (56 [place] Maloney) bougie previously did not not improve her dysphagia in any way.  Past Medical History:  Diagnosis Date   Anemia    anemia of chronic disease +/- IDA followed by Dr. Earlie Server. received iron infusions in the past   Asthma    Borderline glaucoma    CAD (coronary artery disease)    a. s/p CABG 1996. b. low risk nuc 2011. c. LHC 04/2016 due to drop in EF -> occluded native LAD,  widely patent sequential LIMA-D1-LAD.   Chronic kidney disease    "mild stage of kidney disease"   Chronic systolic CHF (congestive heart failure) (HCC)    Complication of anesthesia    DM2 (diabetes mellitus, type 2) ([place])    not on any medicine for this at this time, 05/2016   Dyspnea    with exertion   GERD (gastroesophageal reflux disease)    Gout    History of blood transfusion    History of hiatal hernia    in 20s   History of pneumonia    March, 2016   HLD (hyperlipidemia)    HTN (hypertension)    Hyperthyroidism 01/21/2016   Inappropriate sinus tachycardia    LBBB (left bundle branch block)    a. 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 Dx 04/2016 - EF 25-30%, diffuse HK, elevated LVEDP, mild MR, mod LAE.   NSVT (nonsustained ventricular tachycardia) (HCC)    PAT (paroxysmal atrial tachycardia) (HCC)    Pneumonia    PONV  (postoperative nausea and vomiting)    "after just about every surgery I've had"   Rheumatoid arthritis ([place])    RA- hands   Seasonal allergies     Past Surgical History:  Procedure Laterality Date   ABDOMINAL HYSTERECTOMY     APPENDECTOMY     BACK SURGERY     BIOPSY  04/27/2020   Procedure: BIOPSY;  Surgeon: Daneil Dolin, MD;  [facility];  Service: Endoscopy;;  ascending colon biopsy   CARDIAC CATHETERIZATION N/A 05/01/2016   Procedure: Right/Left Heart Cath and Coronary/Graft Angiography;  Surgeon: Leonie Man, MD;  Location: [place] CV LAB;  Service: Cardiovascular;  Laterality: N/A;   CHOLECYSTECTOMY     COLONOSCOPY  11/2011   Dr. Magod:ext/int hemorrhoids, diverticulosis sigmoid colon and distal desc colon, mid-desc colon hyperplastic polyps.    COLONOSCOPY WITH PROPOFOL N/A 04/27/2020   Carolann Brazell: Diverticulosis, hemorrhoids, normal terminal ileum.  Random colon biopsies significant for chronic lymphocytic colitis   CORONARY ARTERY BYPASS GRAFT  1996   2 vessels   ESOPHAGOGASTRODUODENOSCOPY  11/2011   Dr. Watt Climes: small hiatal hernia, one non-bleeding superficial gastric ulcer, medium-sized diverticulum in area of papilla   ESOPHAGOGASTRODUODENOSCOPY N/A 12/29/2015   Dr. Gala Romney: Chronic inactive gastritis, normal esophagus status  post dilation, duodenal diverticula   ESOPHAGOGASTRODUODENOSCOPY (EGD) WITH PROPOFOL N/A 07/24/2019   Dr. Gala Romney: Normal esophagus status post empiric dilation, small hiatal hernia, large D2 diverticulum   EYE SURGERY Bilateral    cataract removal   FOOT SURGERY     removal bone spur   FRACTURE SURGERY Right    arm   GIVENS CAPSULE STUDY  11/2012   Dr. Watt Climes: minimal gastritis, normal small bowel capsule   HERNIA REPAIR     hiatal hernia   ICD IMPLANT N/A 02/04/2021   Procedure: ICD IMPLANT;  Surgeon: Evans Lance, MD;  Location: [place] CV LAB;  Service: Cardiovascular;  Laterality: N/A;   MALONEY DILATION N/A 07/24/2019   Procedure:  Venia Minks DILATION;  Surgeon: Daneil Dolin, MD;  [facility];  Service: Endoscopy;  Laterality: N/A;   MAXIMUM ACCESS (MAS)POSTERIOR LUMBAR INTERBODY FUSION (PLIF) 1 LEVEL N/A 08/14/2013   Procedure:  MAXIMUM ACCESS SURGERY(MAS) POSTERIOR LUMBAR INTERBODY FUSION LUMBAR THREE-FOUR ;  Surgeon: Eustace Moore, MD;  Location: [place] [facility];  Service: Neurosurgery;  Laterality: N/A;   MAXIMUM ACCESS SURGERY(MAS) POSTERIOR LUMBAR INTERBODY FUSION LUMBAR THREE-FOUR    PTCA     RIGHT/LEFT HEART CATH AND CORONARY ANGIOGRAPHY N/A 03/26/2020   Procedure: RIGHT/LEFT HEART CATH AND CORONARY ANGIOGRAPHY;  Surgeon: Leonie Man, MD;  Location: [place] CV LAB;  Service: Cardiovascular;  Laterality: N/A;   SPINAL CORD STIMULATOR BATTERY EXCHANGE N/A 01/19/2021   Procedure: Spinal cord stimulator battery change;  Surgeon: Reece Agar, MD;  Location: [place];  Service: Neurosurgery;  Laterality: N/A;   SPINAL CORD STIMULATOR INSERTION N/A 06/16/2016   Procedure: LUMBAR SPINAL CORD STIMULATOR INSERTION;  Surgeon: Clydell Hakim, MD;  Location: [place] [facility] ORS;  Service: Neurosurgery;  Laterality: N/A;  LUMBAR SPINAL CORD STIMULATOR INSERTION   tens  05/2016   TONSILLECTOMY      Prior to Admission medications   Medication Sig Start Date End Date Taking? Authorizing Provider  albuterol (PROVENTIL HFA;VENTOLIN HFA) 108 (90 Base) MCG/ACT inhaler Take 2 puffs 3 times a day and every 4 hours as needed. Patient taking differently: Inhale 2 puffs into the lungs every 4 (four) hours as needed for wheezing or shortness of breath. 01/24/16  Yes Rexene Alberts, MD  allopurinol (ZYLOPRIM) 300 MG tablet Take 300 mg by mouth daily.   Yes [provider]  aspirin EC 81 MG EC tablet Take 1 tablet (81 mg total) by mouth daily. 05/03/16  Yes Kilroy, Luke K, PA-C  atorvastatin (LIPITOR) 80 MG tablet Take 80 mg by mouth daily.   Yes [provider]  calcium carbonate (TUMS - DOSED IN MG ELEMENTAL CALCIUM) 500  MG chewable tablet Chew 1 tablet by mouth as needed for indigestion or heartburn.   Yes [provider]  Carboxymethylcellulose Sodium (THERATEARS OP) Place 1 drop into both eyes 3 (three) times daily as needed (dry eyes).   Yes [provider]  carvedilol (COREG) 6.25 MG tablet Take 6.25 mg by mouth 2 (two) times daily with a meal.   Yes [provider]  cholecalciferol (VITAMIN D3) 25 MCG (1000 UT) tablet Take 1,000 Units by mouth daily.   Yes [provider]  ferrous sulfate 325 (65 FE) MG tablet Take 325 mg by mouth daily with breakfast.   Yes [provider]  fexofenadine (ALLEGRA) 180 MG tablet Take 180 mg by mouth daily.   Yes [provider]  fluticasone (FLONASE) 50 MCG/ACT nasal spray Place 2  sprays into both nostrils daily.   Yes [provider]  furosemide (LASIX) 40 MG tablet Take 40 mg by mouth 2 (two) times daily.    Yes [provider]  gabapentin (NEURONTIN) 400 MG capsule Take 1 capsule (400 mg total) by mouth 3 (three) times daily. 04/14/21  Yes Tomi Likens, Adam R, DO  HYDROcodone-acetaminophen (NORCO) 10-325 MG tablet Take 1 tablet by mouth every 6 (six) hours as needed for moderate pain.   Yes [provider]  leflunomide (ARAVA) 20 MG tablet Take 20 mg by mouth daily.   Yes [provider]  losartan (COZAAR) 100 MG tablet Take 100 mg by mouth daily.   Yes [provider]  MAGNESIUM-OXIDE 400 (241.3 Mg) MG tablet TAKE 1 TABLET(400 MG) BY MOUTH DAILY Patient taking differently: Take 400 mg by mouth daily. 01/11/21  Yes Josue Hector, MD  meclizine (ANTIVERT) 12.5 MG tablet Take 12.5 mg by mouth 3 (three) times daily as needed for dizziness.   Yes [provider]  metFORMIN (GLUCOPHAGE-XR) 500 MG 24 hr tablet Take 1,000 mg by mouth 2 (two) times daily. 02/17/21  Yes [provider]  montelukast (SINGULAIR) 10 MG tablet Take 10 mg by mouth at bedtime.   Yes [provider]  Multiple Vitamins-Minerals (OCUVITE ADULT 50+ PO) Take 1 tablet by mouth daily.   Yes [provider]  nitroGLYCERIN (NITROSTAT) 0.4 MG SL tablet Place 1 tablet (0.4 mg total) under the tongue every 5 (five) minutes as needed for chest pain. Reported on 03/24/2016 01/18/21  Yes Evans Lance, MD  potassium chloride SA (K-DUR,KLOR-CON) 20 MEQ tablet Take 1 tablet (20 mEq total) by mouth 2 (two) times daily. 05/03/16  Yes Kilroy, Luke K, PA-C  triamcinolone cream (KENALOG) 0.1 % Apply 1 application topically daily as needed (for irritation).   Yes [provider]  esomeprazole (NEXIUM) 40 MG capsule Take 1 capsule (40 mg total) by mouth 2 (two) times daily before a meal. Patient not taking: No sig reported 03/29/21   Erenest Rasher, PA-C  Lancets (ONETOUCH DELICA PLUS [id number]) [place]  01/06/19   [provider]    Allergies as of 05/06/2021 - Review Complete 04/14/2021  Allergen Reaction Noted   Biaxin [clarithromycin] Rash and Other (See Comments) 02/02/2010   Penicillins Rash and Other (See Comments) 02/02/2010   Penicillin v Other (See Comments) 11/25/2020    Family History  Problem Relation Age of Onset   Heart attack Father    Heart attack Mother    Bladder Cancer Brother    Cervical cancer Daughter        ?   Colon cancer Neg Hx     Social History   Socioeconomic History   Marital status: Married    Spouse name: Not on file   Number of children: 3   Years of education: Not on file   Highest education level: Not on file  Occupational History   Not on file  Tobacco Use   Smoking status: Former    Packs/day: 0.75    Years: 15.00    Pack years: 11.25    Types: Cigarettes    Quit date: 11/20/1994    Years since quitting: 26.5   Smokeless tobacco: Never   Tobacco comments:    Quit in 1996  Vaping Use   Vaping Use: Never used  Substance and Sexual Activity   Alcohol use: Never    Alcohol/week: 0.0 standard drinks   Drug use:  Never  Sexual activity: Yes  Other Topics Concern   Not on file  Social History Narrative   2 living children, one deceased age 53   Right handed   One story home   Drinks coffee am   Social Determinants of Health   Financial Resource Strain: Not on file  Food Insecurity: Not on file  Transportation Needs: Not on file  Physical Activity: Not on file  Stress: Not on file  Social Connections: Not on file  Intimate Partner Violence: Not on file    Review of Systems: See HPI, otherwise negative ROS  Physical Exam: BP 122/65 (BP Location: Right Arm)   Pulse 99   Temp 98.5 F (36.9 C)   Resp 20   SpO2 94%  General:   Alert,  Well-developed, well-nourished, pleasant and cooperative in NAD Mouth:  No deformity or lesions. Neck:  Supple; no masses or thyromegaly. No significant cervical adenopathy. Lungs:  Clear throughout to auscultation.   No wheezes, crackles, or rhonchi. No acute distress. Heart:  Regular rate and rhythm; no murmurs, clicks, rubs,  or gallops. Abdomen: Non-distended, normal bowel sounds.  Soft and nontender without appreciable mass or hepatosplenomegaly.  Pulses:  Normal pulses noted. Extremities:  Without clubbing or edema.  Impression/Plan: 75 year old lady with dysphagia.  Pointing to her neck.  Recent barium pill esophagram implied hypopharyngeal mucosal abnormality.  EGD unrevealing last year.  Barium pill traversed her hypopharyngeal area and the tubular esophagus without impediment.  Prior Maloney dilation did not help.  Recommendations of offer the patient a diagnostic EGD to get a relook at her upper GI tract.  I told her today that she may ultimately need ENT evaluation, however. The risks, benefits, limitations, alternatives and imponderables have been reviewed with the patient. Potential for esophageal dilation, biopsy, etc. have also been reviewed.  Questions have been answered. All parties agreeable.      Notice: This dictation was prepared  with Dragon dictation along with smaller phrase technology. Any transcriptional errors that result from this process are unintentional and may not be corrected upon review.

## 2021-06-03 NOTE — Op Note (Signed)
[facility] Patient Name: Katherine Larson Procedure Date: 06/03/2021 8:45 AM MRN: [id number] Date of Birth: 06/10/1946 Attending MD: Norvel Richards , MD CSN: [id number] Age: 75 Admit Type: Outpatient Procedure:                Upper GI endoscopy Indications:              Dysphagia, Abnormal UGI series Providers:                Norvel Richards, MD, Gwenlyn Fudge, RN,                            Kristine L. Risa Grill, Technician Referring MD:              Medicines:                Propofol per Anesthesia Complications:            No immediate complications. Estimated Blood Loss:     Estimated blood loss: none. Procedure:                Pre-Anesthesia Assessment:                           - Prior to the procedure, a History and Physical                            was performed, and patient medications and                            allergies were reviewed. The patient's tolerance of                            previous anesthesia was also reviewed. The risks                            and benefits of the procedure and the sedation                            options and risks were discussed with the patient.                            All questions were answered, and informed consent                            was obtained. Prior Anticoagulants: The patient has                            taken no previous anticoagulant or antiplatelet                            agents. ASA Grade Assessment: III - A patient with                            severe systemic disease. After reviewing the risks  and benefits, the patient was deemed in                            satisfactory condition to undergo the procedure.                           After obtaining informed consent, the endoscope was                            passed under direct vision. Throughout the                            procedure, the patient's blood pressure, pulse, and                             oxygen saturations were monitored continuously. The                            [id number]-H190 ([id number]) scope was introduced through the                            mouth, and advanced to the second part of duodenum.                            The upper GI endoscopy was accomplished without                            difficulty. The patient tolerated the procedure                            well. Scope In: 9:05:14 AM Scope Out: 9:13:13 AM Total Procedure Duration: 0 hours 7 minutes 59 seconds  Findings:      Examination of the hypopharyngeal area demonstrated abnormal mucosa       bilaterally just distal to the base of the tongue (right-sided greater       than left) overlying mucosa appeared irregular and somewhat verrucous in       appearance. Please see photographs.      The examined esophagus was normal.      The entire examined stomach was normal.      The duodenal bulb and second portion of the duodenum were normal. Impression:               - Normal esophagus.                           - Normal stomach.                           - Normal duodenal bulb and second portion of the                            duodenum.                           - Abnormal hypopharyngeal mucosa as outlined above  and photographed. This is of uncertain                            significance. Patient perceives "something there",                            pointing to her neck, when she tries to swallow.                            This abnormality correlates to area of concern on                            recent barium pill esophagram. Moderate Sedation:      Moderate (conscious) sedation was personally administered by an       anesthesia professional. The following parameters were monitored: oxygen       saturation, heart rate, blood pressure, respiratory rate, EKG, adequacy       of pulmonary ventilation, and response to care. Recommendation:           - Patient has a contact  number available for                            emergencies. The signs and symptoms of potential                            delayed complications were discussed with the                            patient. Return to normal activities tomorrow.                            Written discharge instructions were provided to the                            patient.                           - Advance diet as tolerated. Continue Dexilant 60                            mg daily. ENT evaluation recommended. I reached out                            to Dr. Dema Severin. We will arrange a follow-up                            appointment in our office in 3 months Procedure Code(s):        --- Professional ---                           [phone], Esophagogastroduodenoscopy, flexible,                            transoral; diagnostic, including collection of  specimen(s) by brushing or washing, when performed                            (separate procedure) Diagnosis Code(s):        --- Professional ---                           R13.10, Dysphagia, unspecified                           R93.3, Abnormal findings on diagnostic imaging of                            other parts of digestive tract CPT copyright 2019 [employer]. All rights reserved. The codes documented in this report are preliminary and upon coder review may  be revised to meet current compliance requirements. Cristopher Estimable. Jalei Shibley, MD Norvel Richards, MD 06/03/2021 9:30:54 AM This report has been signed electronically. Number of Addenda: 0

## 2021-06-03 NOTE — Anesthesia Preprocedure Evaluation (Addendum)
Anesthesia Evaluation  Patient identified by MRN, date of birth, ID band Patient awake    Reviewed: Allergy & Precautions, H&P , NPO status , Patient's Chart, lab work & pertinent test results, reviewed documented beta blocker date and time   History of Anesthesia Complications (+) PONV and history of anesthetic complications  Airway Mallampati: II  TM Distance: >3 FB Neck ROM: full    Dental no notable dental hx.    Pulmonary shortness of breath, asthma , pneumonia, COPD, former smoker,    Pulmonary exam normal breath sounds clear to auscultation       Cardiovascular Exercise Tolerance: Good hypertension, + CAD, + Past MI, + Cardiac Stents and +CHF  + dysrhythmias Supra Ventricular Tachycardia + pacemaker + Cardiac Defibrillator  Rhythm:regular Rate:Normal     Neuro/Psych  Headaches, negative psych ROS   GI/Hepatic Neg liver ROS, hiatal hernia, GERD  Medicated,  Endo/Other  diabetesHyperthyroidism   Renal/GU CRFRenal disease  negative genitourinary   Musculoskeletal  (+) Arthritis ,   Abdominal   Peds  Hematology  (+) Blood dyscrasia, anemia ,   Anesthesia Other Findings 1. Left ventricular ejection fraction, by estimation, is 30%. The left  ventricle has moderate to severely decreased function. The left ventricle  demonstrates regional wall motion abnormalities with anteroseptal,  anterior, and apical severe hypokinesis.  There is septal-lateral dyssynchrony consistent with LBBB. Left  ventricular diastolic parameters are consistent with Grade I diastolic  dysfunction (impaired relaxation).  2. Right ventricular systolic function is normal. The right ventricular  size is normal. Tricuspid regurgitation signal is inadequate for assessing  PA pressure.  3. The mitral valve is normal in structure. Trivial mitral valve  regurgitation. No evidence of mitral stenosis.  4. The aortic valve is tricuspid. Aortic  valve regurgitation is trivial.  Mild aortic valve sclerosis is present, with no evidence of aortic valve  stenosis.  5. The inferior vena cava is normal in size with greater than 50%  respiratory variability, suggesting right atrial pressure of 3 mmHg.    ? Severe single-vessel disease with occluded LAD prior to bifurcation into the diagonal branch.  Otherwise widely patent native LCx, RCA and diagonal-LAD that are grafted. ? I suspect that the drop in EF is probably related to a nonischemic etiology or for microvascular ischemia.   ? Nuclear stress test does show mild basal anterior and anteroseptal reversibility in the setting of a large anteroseptal infarct.  I suspect that this is probably due to reduced flow at the site of the occluded LAD stent with minimal upstream septal perforators. ? Normal RIGHT HEART CATH pressures with low LVEDP and PCWP indicating mild dehydration -> indicates adequate diuresis ? Relatively normal cardiac output and index of 5.41 and 3.24 respectively.      Reproductive/Obstetrics negative OB ROS                            Anesthesia Physical Anesthesia Plan  ASA: 3  Anesthesia Plan: General   Post-op Pain Management:    Induction:   PONV Risk Score and Plan: Propofol infusion  Airway Management Planned:   Additional Equipment:   Intra-op Plan:   Post-operative Plan:   Informed Consent: I have reviewed the patients History and Physical, chart, labs and discussed the procedure including the risks, benefits and alternatives for the proposed anesthesia with the patient or authorized representative who has indicated his/her understanding and acceptance.     Dental Advisory Given  Plan Discussed with: CRNA  Anesthesia Plan Comments:        Anesthesia Quick Evaluation

## 2021-06-10 ENCOUNTER — Encounter (HOSPITAL_COMMUNITY): Payer: Self-pay | Admitting: Internal Medicine

## 2021-06-23 ENCOUNTER — Encounter (HOSPITAL_COMMUNITY): Payer: Self-pay

## 2021-06-23 ENCOUNTER — Emergency Department (HOSPITAL_COMMUNITY): Payer: Medicare Other

## 2021-06-23 ENCOUNTER — Emergency Department (HOSPITAL_COMMUNITY)
Admission: EM | Admit: 2021-06-23 | Discharge: 2021-06-23 | Disposition: A | Discharge status: Home / Self Care | Payer: Medicare Other | Attending: Emergency Medicine | Admitting: Emergency Medicine

## 2021-06-23 ENCOUNTER — Other Ambulatory Visit: Payer: Self-pay

## 2021-06-23 DIAGNOSIS — E119 Type 2 diabetes mellitus without complications: Secondary | ICD-10-CM | POA: Diagnosis not present

## 2021-06-23 DIAGNOSIS — Z7984 Long term (current) use of oral hypoglycemic drugs: Secondary | ICD-10-CM | POA: Insufficient documentation

## 2021-06-23 DIAGNOSIS — Z79899 Other long term (current) drug therapy: Secondary | ICD-10-CM | POA: Insufficient documentation

## 2021-06-23 DIAGNOSIS — R0602 Shortness of breath: Secondary | ICD-10-CM | POA: Diagnosis not present

## 2021-06-23 DIAGNOSIS — R0600 Dyspnea, unspecified: Secondary | ICD-10-CM | POA: Diagnosis not present

## 2021-06-23 DIAGNOSIS — I130 Hypertensive heart and chronic kidney disease with heart failure and stage 1 through stage 4 chronic kidney disease, or unspecified chronic kidney disease: Secondary | ICD-10-CM | POA: Insufficient documentation

## 2021-06-23 DIAGNOSIS — N189 Chronic kidney disease, unspecified: Secondary | ICD-10-CM | POA: Diagnosis not present

## 2021-06-23 DIAGNOSIS — R0609 Other forms of dyspnea: Secondary | ICD-10-CM | POA: Diagnosis not present

## 2021-06-23 DIAGNOSIS — I5022 Chronic systolic (congestive) heart failure: Secondary | ICD-10-CM | POA: Diagnosis not present

## 2021-06-23 DIAGNOSIS — Z20822 Contact with and (suspected) exposure to covid-19: Secondary | ICD-10-CM | POA: Insufficient documentation

## 2021-06-23 DIAGNOSIS — R509 Fever, unspecified: Secondary | ICD-10-CM | POA: Diagnosis not present

## 2021-06-23 DIAGNOSIS — Z87891 Personal history of nicotine dependence: Secondary | ICD-10-CM | POA: Insufficient documentation

## 2021-06-23 DIAGNOSIS — J45909 Unspecified asthma, uncomplicated: Secondary | ICD-10-CM | POA: Insufficient documentation

## 2021-06-23 DIAGNOSIS — I517 Cardiomegaly: Secondary | ICD-10-CM | POA: Diagnosis not present

## 2021-06-23 LAB — CBC WITH DIFFERENTIAL/PLATELET
Abs Immature Granulocytes: 0.07 10*3/uL (ref 0.00–0.07)
Basophils Absolute: 0.1 10*3/uL (ref 0.0–0.1)
Basophils Relative: 1 %
Eosinophils Absolute: 0.3 10*3/uL (ref 0.0–0.5)
Eosinophils Relative: 3 %
HCT: 31.4 % — ABNORMAL LOW (ref 36.0–46.0)
Hemoglobin: 9.7 g/dL — ABNORMAL LOW (ref 12.0–15.0)
Immature Granulocytes: 1 %
Lymphocytes Relative: 19 %
Lymphs Abs: 2.5 10*3/uL (ref 0.7–4.0)
MCH: 30.1 pg (ref 26.0–34.0)
MCHC: 30.9 g/dL (ref 30.0–36.0)
MCV: 97.5 fL (ref 80.0–100.0)
Monocytes Absolute: 0.8 10*3/uL (ref 0.1–1.0)
Monocytes Relative: 6 %
Neutro Abs: 9.2 10*3/uL — ABNORMAL HIGH (ref 1.7–7.7)
Neutrophils Relative %: 70 %
Platelets: 364 10*3/uL (ref 150–400)
RBC: 3.22 MIL/uL — ABNORMAL LOW (ref 3.87–5.11)
RDW: 15.3 % (ref 11.5–15.5)
WBC: 13 10*3/uL — ABNORMAL HIGH (ref 4.0–10.5)
nRBC: 0 % (ref 0.0–0.2)

## 2021-06-23 LAB — COMPREHENSIVE METABOLIC PANEL
ALT: 11 U/L (ref 0–44)
AST: 16 U/L (ref 15–41)
Albumin: 3.6 g/dL (ref 3.5–5.0)
Alkaline Phosphatase: 84 U/L (ref 38–126)
Anion gap: 9 (ref 5–15)
BUN: 18 mg/dL (ref 8–23)
CO2: 27 mmol/L (ref 22–32)
Calcium: 8.7 mg/dL — ABNORMAL LOW (ref 8.9–10.3)
Chloride: 100 mmol/L (ref 98–111)
Creatinine, Ser: 0.94 mg/dL (ref 0.44–1.00)
GFR, Estimated: >60 mL/min (ref >60)
Glucose, Bld: 122 mg/dL — ABNORMAL HIGH (ref 70–99)
Potassium: 4.1 mmol/L (ref 3.5–5.1)
Sodium: 136 mmol/L (ref 135–145)
Total Bilirubin: 0.6 mg/dL (ref 0.3–1.2)
Total Protein: 7.4 g/dL (ref 6.5–8.1)

## 2021-06-23 LAB — RESP PANEL BY RT-PCR (FLU A&B, COVID) ARPGX2
Influenza A by PCR: NEGATIVE
Influenza B by PCR: NEGATIVE
SARS Coronavirus 2 by RT PCR: NEGATIVE

## 2021-06-23 LAB — BRAIN NATRIURETIC PEPTIDE: B Natriuretic Peptide: 461 pg/mL — ABNORMAL HIGH (ref 0.0–100.0)

## 2021-06-23 NOTE — ED Provider Notes (Signed)
[facility] EMERGENCY DEPARTMENT Provider Note   CSN: [id number] Arrival date & time: 06/23/21  1746     History Chief Complaint  Patient presents with   Shortness of Breath    Katherine Larson is a 75 y.o. female.  HPI She is here for evaluation of shortness of breath, present for couple days.  She took COVID tests at home and they were negative.  She denies fever, cough, nausea, vomiting.  She feels that she has swelling in her legs.  She is taking her usual medications.  She contacted her doctor and was instructed to come here for further care and treatment.  She had an upper endoscopy, 2 weeks ago.  This was for difficulty swallowing and an abnormal upper GI series.  Findings were normal.  No intervention was undertaken.  She has a history of congestive heart failure with an EF around 30%.  She had a defibrillator/pacemaker placed, 5 months ago.  No recent evaluations by cardiology.  No known sick contacts.  There are no other known modifying factors.    Past Medical History:  Diagnosis Date   Anemia    anemia of chronic disease +/- IDA followed by Dr. Earlie Server. received iron infusions in the past   Asthma    Borderline glaucoma    CAD (coronary artery disease)    a. s/p CABG 1996. b. low risk nuc 2011. c. LHC 04/2016 due to drop in EF -> occluded native LAD,  widely patent sequential LIMA-D1-LAD.   Chronic kidney disease    "mild stage of kidney disease"   Chronic systolic CHF (congestive heart failure) (HCC)    Complication of anesthesia    DM2 (diabetes mellitus, type 2) ([place])    not on any medicine for this at this time, 05/2016   Dyspnea    with exertion   GERD (gastroesophageal reflux disease)    Gout    History of blood transfusion    History of hiatal hernia    in 20s   History of pneumonia    March, 2016   HLD (hyperlipidemia)    HTN (hypertension)    Hyperthyroidism 01/21/2016   Inappropriate sinus tachycardia    LBBB (left bundle branch block)    a. 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 Dx 04/2016 - EF 25-30%, diffuse HK, elevated LVEDP, mild MR, mod LAE.   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   Seasonal allergies     Patient Active Problem List   Diagnosis Date Noted   Lymphocytic colitis 07/02/2020   Coronary artery disease, occlusive: CTO proxLAD 03/26/2020   Abnormal nuclear stress test 03/26/2020   Chronic diarrhea 03/02/2020   NICM- new drop in EF 25-30% 05/03/2016   Acute pulmonary edema (HCC)    LBBB (left bundle branch block) 04/29/2016   Chronic combined systolic and diastolic heart failure ([place]) 04/29/2016   Abdominal pain, chronic, epigastric 03/07/2016   Sepsis ([place])    COPD exacerbation ([place]) 01/21/2016   Hyperthyroidism 01/21/2016   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   Hypokalemia 01/19/2016  Hyponatremia 01/19/2016   Diet-controlled diabetes mellitus ([place]) 01/19/2016   CAP (community acquired pneumonia) 01/18/2016   Mucosal abnormality of stomach    Dysphagia    Constipation 12/08/2015   Gastroesophageal reflux disease 12/08/2015   Abnormal CT scan, esophagus 12/08/2015   Esophageal dysphagia 12/08/2015   Abdominal pain, epigastric 12/08/2015   Abnormal weight loss 12/08/2015   Lower abdominal pain 12/08/2015   Back pain 08/26/2015   Headache 08/26/2015   Essential hypertension 08/26/2015   DM type 2 (diabetes mellitus, type 2) ([place]) 08/26/2015   Sinus tachycardia 06/07/2015   Diarrhea 06/07/2015   Preop cardiovascular exam 07/16/2013   Atrial flutter ([place]) 07/16/2013   Anemia 01/08/2013   Leukocytosis 12/17/2012   DYSPNEA 02/02/2010   DIABETES  MELLITUS 07/28/2009   Hyperlipidemia 07/28/2009   CHOLECYSTECTOMY, HX OF 07/28/2009   Hx of CABG '96- cath 05/01/16 07/28/2009   HERNIORRHAPHY, HX OF 07/28/2009    Past Surgical History:  Procedure Laterality Date   ABDOMINAL HYSTERECTOMY     APPENDECTOMY     BACK SURGERY     BIOPSY  04/27/2020   Procedure: BIOPSY;  Surgeon: Daneil Dolin, MD;  [facility];  Service: Endoscopy;;  ascending colon biopsy   CARDIAC CATHETERIZATION N/A 05/01/2016   Procedure: Right/Left Heart Cath and Coronary/Graft Angiography;  Surgeon: Leonie Man, MD;  Location: [place] CV LAB;  Service: Cardiovascular;  Laterality: N/A;   CHOLECYSTECTOMY     COLONOSCOPY  11/2011   Dr. Magod:ext/int hemorrhoids, diverticulosis sigmoid colon and distal desc colon, mid-desc colon hyperplastic polyps.    COLONOSCOPY WITH PROPOFOL N/A 04/27/2020   Rourk: Diverticulosis, hemorrhoids, normal terminal ileum.  Random colon biopsies significant for chronic lymphocytic colitis   CORONARY ARTERY BYPASS GRAFT  1996   2 vessels   ESOPHAGOGASTRODUODENOSCOPY  11/2011   Dr. Watt Climes: small hiatal hernia, one non-bleeding superficial gastric ulcer, medium-sized diverticulum in area of papilla   ESOPHAGOGASTRODUODENOSCOPY N/A 12/29/2015   Dr. Gala Romney: Chronic inactive gastritis, normal esophagus status post dilation, duodenal diverticula   ESOPHAGOGASTRODUODENOSCOPY (EGD) WITH PROPOFOL N/A 07/24/2019   Dr. Gala Romney: Normal esophagus status post empiric dilation, small hiatal hernia, large D2 diverticulum   ESOPHAGOGASTRODUODENOSCOPY (EGD) WITH PROPOFOL N/A 06/03/2021   Procedure: ESOPHAGOGASTRODUODENOSCOPY (EGD) WITH PROPOFOL;  Surgeon: Daneil Dolin, MD;  Location[facility];  Service: Endoscopy;  Laterality: N/A;  9:00am   EYE SURGERY Bilateral    cataract removal   FOOT SURGERY     removal bone spur   FRACTURE SURGERY Right    arm   GIVENS CAPSULE STUDY  11/2012   Dr. Watt Climes: minimal gastritis, normal small bowel capsule    HERNIA REPAIR     hiatal hernia   ICD IMPLANT N/A 02/04/2021   Procedure: ICD IMPLANT;  Surgeon: Evans Lance, MD;  Location: [place] CV LAB;  Service: Cardiovascular;  Laterality: N/A;   MALONEY DILATION N/A 07/24/2019   Procedure: Venia Minks DILATION;  Surgeon: Daneil Dolin, MD;  [facility];  Service: Endoscopy;  Laterality: N/A;   MALONEY DILATION N/A 06/03/2021   Procedure: Venia Minks DILATION;  Surgeon: Daneil Dolin, MD;  [facility];  Service: Endoscopy;  Laterality: N/A;   MAXIMUM ACCESS (MAS)POSTERIOR LUMBAR INTERBODY FUSION (PLIF) 1 LEVEL N/A 08/14/2013   Procedure:  MAXIMUM ACCESS SURGERY(MAS) POSTERIOR LUMBAR INTERBODY FUSION LUMBAR THREE-FOUR ;  Surgeon: Eustace Moore, MD;  Location: [place] NEURO ORS;  Service: Neurosurgery;  Laterality: N/A;   MAXIMUM ACCESS SURGERY(MAS) POSTERIOR LUMBAR  INTERBODY FUSION LUMBAR THREE-FOUR    PTCA     RIGHT/LEFT HEART CATH AND CORONARY ANGIOGRAPHY N/A 03/26/2020   Procedure: RIGHT/LEFT HEART CATH AND CORONARY ANGIOGRAPHY;  Surgeon: Leonie Man, MD;  Location: [place] CV LAB;  Service: Cardiovascular;  Laterality: N/A;   SPINAL CORD STIMULATOR BATTERY EXCHANGE N/A 01/19/2021   Procedure: Spinal cord stimulator battery change;  Surgeon: Reece Agar, MD;  Location: [place];  Service: Neurosurgery;  Laterality: N/A;   SPINAL CORD STIMULATOR INSERTION N/A 06/16/2016   Procedure: LUMBAR SPINAL CORD STIMULATOR INSERTION;  Surgeon: Clydell Hakim, MD;  Location: [place] NEURO ORS;  Service: Neurosurgery;  Laterality: N/A;  LUMBAR SPINAL CORD STIMULATOR INSERTION   tens  05/2016   TONSILLECTOMY       OB History   No obstetric history on file.     Family History  Problem Relation Age of Onset   Heart attack Father    Heart attack Mother    Bladder Cancer Brother    Cervical cancer Daughter        ?   Colon cancer Neg Hx     Social History   Tobacco Use   Smoking status: Former    Packs/day: 0.75    Years: 15.00     Pack years: 11.25    Types: Cigarettes    Quit date: 11/20/1994    Years since quitting: 26.6   Smokeless tobacco: Never   Tobacco comments:    Quit in 1996  Vaping Use   Vaping Use: Never used  Substance Use Topics   Alcohol use: Never    Alcohol/week: 0.0 standard drinks   Drug use: Never    Home Medications Prior to Admission medications   Medication Sig Start Date End Date Taking? Authorizing Provider  albuterol (PROVENTIL HFA;VENTOLIN HFA) 108 (90 Base) MCG/ACT inhaler Take 2 puffs 3 times a day and every 4 hours as needed. Patient taking differently: Inhale 2 puffs into the lungs every 4 (four) hours as needed for wheezing or shortness of breath. 01/24/16  Yes Rexene Alberts, MD  allopurinol (ZYLOPRIM) 300 MG tablet Take 300 mg by mouth daily.   Yes [provider]  aspirin EC 81 MG EC tablet Take 1 tablet (81 mg total) by mouth daily. 05/03/16  Yes Kilroy, Luke K, PA-C  atorvastatin (LIPITOR) 80 MG tablet Take 80 mg by mouth daily.   Yes [provider]  calcium carbonate (TUMS - DOSED IN MG ELEMENTAL CALCIUM) 500 MG chewable tablet Chew 1 tablet by mouth as needed for indigestion or heartburn.   Yes [provider]  Carboxymethylcellulose Sodium (THERATEARS OP) Place 1 drop into both eyes 3 (three) times daily as needed (dry eyes).   Yes [provider]  carvedilol (COREG) 6.25 MG tablet Take 6.25 mg by mouth 2 (two) times daily with a meal.   Yes [provider]  cholecalciferol (VITAMIN D3) 25 MCG (1000 UT) tablet Take 1,000 Units by mouth daily.   Yes [provider]  ferrous sulfate 325 (65 FE) MG tablet Take 325 mg by mouth daily with breakfast.   Yes [provider]  fexofenadine (ALLEGRA) 180 MG tablet Take 180 mg by mouth daily.   Yes [provider]  fluticasone (FLONASE) 50 MCG/ACT nasal spray Place 2 sprays into both nostrils daily.   Yes [provider]  furosemide (LASIX) 40 MG tablet Take  40 mg by mouth 2 (two) times daily.    Yes [provider]  gabapentin (  NEURONTIN) 400 MG capsule Take 1 capsule (400 mg total) by mouth 3 (three) times daily. 04/14/21  Yes Tomi Likens, Adam R, DO  HYDROcodone-acetaminophen (NORCO) 10-325 MG tablet Take 1 tablet by mouth every 6 (six) hours as needed for moderate pain.   Yes [provider]  Lancets (ONETOUCH DELICA PLUS [id number]) [place]  01/06/19  Yes [provider]  leflunomide (ARAVA) 20 MG tablet Take 20 mg by mouth daily.   Yes [provider]  losartan (COZAAR) 100 MG tablet Take 100 mg by mouth daily.   Yes [provider]  MAGNESIUM-OXIDE 400 (241.3 Mg) MG tablet TAKE 1 TABLET(400 MG) BY MOUTH DAILY Patient taking differently: Take 400 mg by mouth daily. 01/11/21  Yes Josue Hector, MD  meclizine (ANTIVERT) 12.5 MG tablet Take 12.5 mg by mouth 3 (three) times daily as needed for dizziness.   Yes [provider]  metFORMIN (GLUCOPHAGE-XR) 500 MG 24 hr tablet Take 1,000 mg by mouth 2 (two) times daily. 02/17/21  Yes [provider]  montelukast (SINGULAIR) 10 MG tablet Take 10 mg by mouth at bedtime.   Yes [provider]  Multiple Vitamins-Minerals (OCUVITE ADULT 50+ PO) Take 1 tablet by mouth daily.   Yes [provider]  nitroGLYCERIN (NITROSTAT) 0.4 MG SL tablet Place 1 tablet (0.4 mg total) under the tongue every 5 (five) minutes as needed for chest pain. Reported on 03/24/2016 01/18/21  Yes Evans Lance, MD  potassium chloride SA (K-DUR,KLOR-CON) 20 MEQ tablet Take 1 tablet (20 mEq total) by mouth 2 (two) times daily. 05/03/16  Yes Kilroy, Luke K, PA-C  triamcinolone cream (KENALOG) 0.1 % Apply 1 application topically daily as needed (for irritation).   Yes [provider]    Allergies    Biaxin [clarithromycin] and Penicillins  Review of Systems   Review of Systems  All other systems reviewed and are negative.  Physical Exam Updated Vital Signs BP  111/61 (BP Location: Left Arm)   Pulse (!) 101   Temp 98.9 F (37.2 C)   Resp (!) 26   Ht '5\' 2"'$  (1.575 m)   Wt 64.4 kg   SpO2 95%   BMI 25.97 kg/m   Physical Exam Vitals and nursing note reviewed.  Constitutional:      General: She is not in acute distress.    Appearance: She is well-developed. She is not ill-appearing or diaphoretic.  HENT:     Head: Normocephalic and atraumatic.     Right Ear: External ear normal.     Left Ear: External ear normal.  Eyes:     Conjunctiva/sclera: Conjunctivae normal.     Pupils: Pupils are equal, round, and reactive to light.  Neck:     Trachea: Phonation normal.  Cardiovascular:     Rate and Rhythm: Normal rate and regular rhythm.     Heart sounds: Normal heart sounds.  Pulmonary:     Effort: Pulmonary effort is normal. No respiratory distress.     Breath sounds: No stridor.     Comments: Rales bilaterally, decreased air movement to mid to upper lung zones bilaterally. Abdominal:     Palpations: Abdomen is soft.     Tenderness: There is no abdominal tenderness.  Musculoskeletal:        General: Normal range of motion.     Cervical back: Normal range of motion and neck supple.     Comments: Mild edema of the ankles and feet.  Symmetric.  Skin:    General:  Skin is warm and dry.  Neurological:     Mental Status: She is alert and oriented to person, place, and time.     Cranial Nerves: No cranial nerve deficit.     Sensory: No sensory deficit.     Motor: No abnormal muscle tone.     Coordination: Coordination normal.  Psychiatric:        Mood and Affect: Mood normal.        Behavior: Behavior normal.        Thought Content: Thought content normal.        Judgment: Judgment normal.    ED Results / Procedures / Treatments   Labs (all labs ordered are listed, but only abnormal results are displayed) Labs Reviewed  COMPREHENSIVE METABOLIC PANEL - Abnormal; Notable for the following components:      Result Value   Glucose, Bld 122  (*)    Calcium 8.7 (*)    All other components within normal limits  CBC WITH DIFFERENTIAL/PLATELET - Abnormal; Notable for the following components:   WBC 13.0 (*)    RBC 3.22 (*)    Hemoglobin 9.7 (*)    HCT 31.4 (*)    Neutro Abs 9.2 (*)    All other components within normal limits  BRAIN NATRIURETIC PEPTIDE - Abnormal; Notable for the following components:   B Natriuretic Peptide 461.0 (*)    All other components within normal limits  RESP PANEL BY RT-PCR (FLU A&B, COVID) [id number]    EKG EKG Interpretation  Date/Time:  Thursday June 23 2021 21:16:36 EDT Ventricular Rate:  103 PR Interval:  147 QRS Duration: 142 QT Interval:  419 QTC Calculation: 549 R Axis:   109 Text Interpretation: Sinus tachycardia Nonspecific intraventricular conduction delay Non-specific ST-t changes since last tracing no significant change Confirmed by Daleen Bo [phone]) on 06/23/2021 9:38:05 PM  Radiology DG Chest Port 1 View  Result Date: 06/23/2021 CLINICAL DATA:  Dyspnea EXAM: PORTABLE CHEST 1 VIEW COMPARISON:  02/04/2021 FINDINGS: Lungs are clear. No pneumothorax or pleural effusion. Stable cardiomegaly. Coronary artery bypass grafting has been performed. Left subclavian single lead pacemaker defibrillator is unchanged. Pulmonary vascularity is normal. No acute bone abnormality. Dorsal column stimulator leads again noted and are unchanged. IMPRESSION: No active disease.  Stable cardiomegaly. Electronically Signed   By: Fidela Salisbury MD   On: 06/23/2021 20:04    Procedures Procedures   Medications Ordered in ED Medications - No data to display  ED Course  I have reviewed the triage vital signs and the nursing notes.  Pertinent labs & imaging results that were available during my care of the patient were reviewed by me and considered in my medical decision making (see chart for details).    MDM Rules/Calculators/A&P                            Patient Vitals for the past 24 hrs:  BP  Temp Temp src Pulse Resp SpO2 Height Weight  06/23/21 2130 111/61 -- -- (!) 101 (!) 26 95 % -- --  06/23/21 2115 -- -- -- (!) 105 20 91 % -- --  06/23/21 2112 -- -- -- (!) 107 (!) 24 91 % -- --  06/23/21 2100 132/69 98.9 F (37.2 C) -- (!) 120 (!) 22 92 % -- --  06/23/21 2045 -- -- -- (!) 107 20 93 % -- --  06/23/21 2030 121/78 -- -- (!) 103 (!) 22 91 % -- --  06/23/21 2015 -- -- -- (!) 108 17 94 % -- --  06/23/21 2000 123/73 -- -- (!) 111 19 92 % -- --  06/23/21 1945 -- -- -- (!) 110 20 93 % -- --  06/23/21 1930 91/70 99 F (37.2 C) Oral (!) 116 (!) 22 91 % -- --  06/23/21 1915 -- -- -- (!) 106 -- 91 % -- --  06/23/21 1900 -- -- -- (!) 108 20 91 % -- --  06/23/21 1845 -- -- -- (!) 107 18 91 % -- --  06/23/21 1830 -- -- -- (!) 107 (!) 24 91 % -- --  06/23/21 1815 -- -- -- (!) 109 18 92 % -- --  06/23/21 1800 128/68 -- -- (!) 117 -- (!) 88 % -- --  06/23/21 1755 128/75 -- -- 100 20 99 % -- --  06/23/21 1753 -- -- -- -- -- -- '5\' 2"'$  (1.575 m) 64.4 kg  06/23/21 1751 -- 98.9 F (37.2 C) Oral -- -- -- -- --    10:07 PM reevaluation with update and discussion. After initial assessment and treatment, an updated evaluation reveals she is comfortable and has no further complaints.  Findings discussed with the patient and all questions were answered. Daleen Bo   Medical Decision Making:  This patient is presenting for evaluation of shortness of breath, which does require a range of treatment options, and is a complaint that involves a moderate risk of morbidity and mortality. The differential diagnoses include CHF, asthma, COPD, viral infection. I decided to review old records, and in summary female with history of heart failure presenting with nonspecific shortness of breath without other concerning symptoms.  I did not require additional historical information from anyone.  Clinical Laboratory Tests Ordered, included CBC, Metabolic panel, and viral panel, BNP . Review indicates normal  except glucose high, calcium low, BNP high, white count high, hemoglobin low, viral panel is normal. Radiologic Tests Ordered, included chest x-ray.  I independently Visualized: Radiograph images, which show no acute CHF or infiltrate  Cardiac Monitor Tracing which shows sinus tachycardia    Critical Interventions-clinical evaluation, laboratory testing, cardiac monitor, EKG, radiography, observation and reassessment  After These Interventions, the Patient was reevaluated and was found with chest x-ray that does not show failure.  BNP elevated but much better than prior baseline.  She has baseline elevation of white count and low hemoglobin.  Overall she appears stable without signs of acute heart failure or pneumonia. Vital signs at 2130 are reassuring.  CRITICAL CARE- no Performed by: Daleen Bo  Nursing Notes Reviewed/ Care Coordinated Applicable Imaging Reviewed Interpretation of Laboratory Data incorporated into ED treatment  The patient appears reasonably screened and/or stabilized for discharge and I doubt any other medical condition or other [facility] requiring further screening, evaluation, or treatment in the ED at this time prior to discharge.  Plan: Home Medications-continue usual; Home Treatments-low-salt diet; return here if the recommended treatment, does not improve the symptoms; Recommended follow up-PCP follow-up 1 week and as needed     Final Clinical Impression(s) / ED Diagnoses Final diagnoses:  Dyspnea, unspecified type    Rx / DC Orders ED Discharge Orders     None        Daleen Bo, MD 06/26/21 1124

## 2021-06-23 NOTE — ED Notes (Signed)
Ambulating room air oxygen sat = 92%, HR = 111. Denies sob or other complaints.

## 2021-06-23 NOTE — ED Notes (Signed)
Pt placed on oxygen nasal cannula 2L/min for decreased oxygen sat with increased activity.

## 2021-06-23 NOTE — Discharge Instructions (Addendum)
Continue taking your regular medications.  Stay on a low-salt diet.  Follow-up with your PCP in a week or so for checkup.  She may want you to see your cardiologist as well.

## 2021-06-23 NOTE — ED Notes (Signed)
[place] sandwich, chips, and lemon lime soda provided to patient per her request.

## 2021-06-23 NOTE — ED Triage Notes (Signed)
Doctor told her to come up here and get a breathing treatment and X ray.  Pt having DIB for a couple days, pt COVID negative as today.  Skin warm and dry, pale.

## 2021-06-23 NOTE — ED Notes (Signed)
Front desk notified by the unit secretary that the patient may have visitors.

## 2021-06-27 ENCOUNTER — Ambulatory Visit: Payer: Medicare Other | Admitting: Gastroenterology

## 2021-06-30 DIAGNOSIS — E1121 Type 2 diabetes mellitus with diabetic nephropathy: Secondary | ICD-10-CM | POA: Diagnosis not present

## 2021-06-30 DIAGNOSIS — E1140 Type 2 diabetes mellitus with diabetic neuropathy, unspecified: Secondary | ICD-10-CM | POA: Diagnosis not present

## 2021-06-30 DIAGNOSIS — E538 Deficiency of other specified B group vitamins: Secondary | ICD-10-CM | POA: Diagnosis not present

## 2021-06-30 DIAGNOSIS — D509 Iron deficiency anemia, unspecified: Secondary | ICD-10-CM | POA: Diagnosis not present

## 2021-06-30 DIAGNOSIS — E785 Hyperlipidemia, unspecified: Secondary | ICD-10-CM | POA: Diagnosis not present

## 2021-06-30 DIAGNOSIS — I129 Hypertensive chronic kidney disease with stage 1 through stage 4 chronic kidney disease, or unspecified chronic kidney disease: Secondary | ICD-10-CM | POA: Diagnosis not present

## 2021-06-30 DIAGNOSIS — J4540 Moderate persistent asthma, uncomplicated: Secondary | ICD-10-CM | POA: Diagnosis not present

## 2021-06-30 DIAGNOSIS — N183 Chronic kidney disease, stage 3 unspecified: Secondary | ICD-10-CM | POA: Diagnosis not present

## 2021-06-30 DIAGNOSIS — J0100 Acute maxillary sinusitis, unspecified: Secondary | ICD-10-CM | POA: Diagnosis not present

## 2021-07-06 DIAGNOSIS — M5416 Radiculopathy, lumbar region: Secondary | ICD-10-CM | POA: Diagnosis not present

## 2021-07-06 DIAGNOSIS — M47816 Spondylosis without myelopathy or radiculopathy, lumbar region: Secondary | ICD-10-CM | POA: Diagnosis not present

## 2021-07-06 DIAGNOSIS — M4722 Other spondylosis with radiculopathy, cervical region: Secondary | ICD-10-CM | POA: Diagnosis not present

## 2021-07-06 DIAGNOSIS — Z9689 Presence of other specified functional implants: Secondary | ICD-10-CM | POA: Diagnosis not present

## 2021-07-07 ENCOUNTER — Encounter (HOSPITAL_COMMUNITY)
Admission: RE | Admit: 2021-07-07 | Discharge: 2021-07-07 | Disposition: A | Discharge status: Home / Self Care | Payer: Medicare Other | Source: Ambulatory Visit | Attending: Family Medicine | Admitting: Family Medicine

## 2021-07-07 ENCOUNTER — Other Ambulatory Visit: Payer: Self-pay

## 2021-07-07 DIAGNOSIS — D509 Iron deficiency anemia, unspecified: Secondary | ICD-10-CM | POA: Insufficient documentation

## 2021-07-07 MED ORDER — SODIUM CHLORIDE 0.9 % IV SOLN
510.0000 mg | Freq: Once | INTRAVENOUS | Status: AC
  Administered 2021-07-07: 510 mg via INTRAVENOUS
  Filled 2021-07-07: qty 17

## 2021-07-07 MED ORDER — SODIUM CHLORIDE 0.9 % IV SOLN: INTRAVENOUS | Status: DC

## 2021-07-08 DIAGNOSIS — K219 Gastro-esophageal reflux disease without esophagitis: Secondary | ICD-10-CM | POA: Diagnosis not present

## 2021-07-08 DIAGNOSIS — R1314 Dysphagia, pharyngoesophageal phase: Secondary | ICD-10-CM | POA: Diagnosis not present

## 2021-07-14 ENCOUNTER — Other Ambulatory Visit: Payer: Self-pay

## 2021-07-14 ENCOUNTER — Encounter (HOSPITAL_COMMUNITY)
Admission: RE | Admit: 2021-07-14 | Discharge: 2021-07-14 | Disposition: A | Discharge status: Home / Self Care | Payer: Medicare Other | Source: Ambulatory Visit | Attending: Family Medicine | Admitting: Family Medicine

## 2021-07-14 ENCOUNTER — Encounter (HOSPITAL_COMMUNITY): Payer: Self-pay

## 2021-07-14 DIAGNOSIS — R059 Cough, unspecified: Secondary | ICD-10-CM | POA: Diagnosis not present

## 2021-07-14 DIAGNOSIS — D509 Iron deficiency anemia, unspecified: Secondary | ICD-10-CM | POA: Diagnosis not present

## 2021-07-14 MED ORDER — SODIUM CHLORIDE 0.9 % IV SOLN: INTRAVENOUS | Status: DC

## 2021-07-14 MED ORDER — SODIUM CHLORIDE 0.9 % IV SOLN
510.0000 mg | Freq: Once | INTRAVENOUS | Status: AC
  Administered 2021-07-14: 510 mg via INTRAVENOUS
  Filled 2021-07-14: qty 510

## 2021-07-20 DIAGNOSIS — R609 Edema, unspecified: Secondary | ICD-10-CM | POA: Diagnosis not present

## 2021-07-22 ENCOUNTER — Telehealth: Payer: Self-pay | Admitting: Cardiovascular Disease

## 2021-07-22 DIAGNOSIS — I5042 Chronic combined systolic (congestive) and diastolic (congestive) heart failure: Secondary | ICD-10-CM

## 2021-07-22 NOTE — Telephone Encounter (Signed)
Called patient about her message. Pateint stated her swelling has gone down and she is not having any problems urinating now. Patient wants to know if she needs an echo. Will see what lab work shows when fax is received. Will forward to Dr. [name] Cancel for advisement.

## 2021-07-22 NOTE — Telephone Encounter (Signed)
Katherine Larson is calling stating she was having some swelling and she was not urinating. Due to this her Doctor advised her to go in to get labs since [place] feared it could be her kidneys. She went to [facility] and had them performed. She was called and told that she has congestive heart failure. The also advised her to call our office and request an Echo be performed. Katherine Larson states the swelling has since went down and is going to have them fax the results. Please advise.

## 2021-07-25 ENCOUNTER — Telehealth: Payer: Self-pay | Admitting: Physician Assistant

## 2021-07-25 NOTE — Telephone Encounter (Signed)
Patient seen by [facility] walk-in clinic last week and said she has congestive heart failure based on lab work.  I am unable to review lab.  Patient states that her kidney function was normal.  She takes Lasix 40 mg twice daily on regular basis.  She took extra 40 mg on Saturday and Sunday.  Reports improvement in lower extremity edema.  Still has swollen belly.  Reports dyspnea, orthopnea and PND.  No chest pain.  She has been feeling fatigue.   Most recent blood pressure 98/60.  Heart rate 95.  She has already taken her morning dose of Coreg and losartan.   Still 2lb above from baseline.  Have recommended to take Coreg only in evening if systolic blood pressure above 90.  She already took 40 mg of Lasix in the morning.  I have recommended to go ahead and take another 80 mg of Lasix to see response.  If no improvement, patient will come to ER.

## 2021-07-26 ENCOUNTER — Telehealth: Payer: Self-pay | Admitting: Cardiovascular Disease

## 2021-07-26 NOTE — Telephone Encounter (Signed)
Patient's husband called in asking that Dr [name] Cancel review her lab work that was sent over from [place]. They told the patient she was having heart failure. Please advise

## 2021-07-26 NOTE — Telephone Encounter (Signed)
Per Dr. [name] Cancel, okay to do f/u echo she has known CHF. Ordered echo. Informed patient that lab work was received and Dr. [name] Cancel will review. Informed patient that someone will give her a call to schedule echo.

## 2021-07-26 NOTE — Telephone Encounter (Signed)
Called patient and let her know lab work was received at [facility] office and was scanned into her chart. Will send to Dr. [name] Cancel.

## 2021-08-02 ENCOUNTER — Other Ambulatory Visit: Payer: Self-pay | Admitting: Family Medicine

## 2021-08-02 ENCOUNTER — Ambulatory Visit
Admission: RE | Admit: 2021-08-02 | Discharge: 2021-08-02 | Disposition: A | Discharge status: Home / Self Care | Payer: Medicare Other | Source: Ambulatory Visit | Attending: Family Medicine | Admitting: Family Medicine

## 2021-08-02 ENCOUNTER — Telehealth: Payer: Self-pay

## 2021-08-02 DIAGNOSIS — I5042 Chronic combined systolic (congestive) and diastolic (congestive) heart failure: Secondary | ICD-10-CM

## 2021-08-02 DIAGNOSIS — R059 Cough, unspecified: Secondary | ICD-10-CM | POA: Diagnosis not present

## 2021-08-02 DIAGNOSIS — R053 Chronic cough: Secondary | ICD-10-CM

## 2021-08-02 MED ORDER — SPIRONOLACTONE 25 MG PO TABS: 12.5000 mg | ORAL_TABLET | Freq: Every day | ORAL | 3 refills | Status: DC

## 2021-08-02 NOTE — Telephone Encounter (Signed)
Called patient with changes and lab results. Patient will go to [place] office for lab work in two weeks. Placed order for referral for CHF clinic.

## 2021-08-02 NOTE — Telephone Encounter (Signed)
Michaelyn Barter, RN    Can you make sure patient knows where to go for lab work in 2 weeks, please?

## 2021-08-02 NOTE — Telephone Encounter (Signed)
Pt notified where to go to have lab work drawn at [facility]

## 2021-08-02 NOTE — Telephone Encounter (Signed)
-----   Message from Josue Hector, MD sent at 08/02/2021 11:32 AM EDT ----- BNP elevated 536 K/Cr normal should be taking lasix 40 bid add aldactone 12.5 mg daily and have her f/u CHF clinic Check BMET / BNP in 2 weeks after starting aldactone

## 2021-08-03 DIAGNOSIS — I5022 Chronic systolic (congestive) heart failure: Secondary | ICD-10-CM | POA: Diagnosis not present

## 2021-08-03 DIAGNOSIS — J449 Chronic obstructive pulmonary disease, unspecified: Secondary | ICD-10-CM | POA: Diagnosis not present

## 2021-08-03 DIAGNOSIS — R052 Subacute cough: Secondary | ICD-10-CM | POA: Diagnosis not present

## 2021-08-03 DIAGNOSIS — J4540 Moderate persistent asthma, uncomplicated: Secondary | ICD-10-CM | POA: Diagnosis not present

## 2021-08-03 DIAGNOSIS — D509 Iron deficiency anemia, unspecified: Secondary | ICD-10-CM | POA: Diagnosis not present

## 2021-08-05 ENCOUNTER — Ambulatory Visit (INDEPENDENT_AMBULATORY_CARE_PROVIDER_SITE_OTHER): Payer: Medicare Other

## 2021-08-05 DIAGNOSIS — I5042 Chronic combined systolic (congestive) and diastolic (congestive) heart failure: Secondary | ICD-10-CM | POA: Diagnosis not present

## 2021-08-08 LAB — CUP PACEART REMOTE DEVICE CHECK
Battery Remaining Longevity: 115 mo
Battery Remaining Percentage: 94 %
Battery Voltage: 3.02 V
Brady Statistic RV Percent Paced: 0 %
Date Time Interrogation Session: 20220916022716
HighPow Impedance: 52 Ohm
Implantable Lead Implant Date: 20220318
Implantable Lead Location: 753860
Implantable Pulse Generator Implant Date: 20220318
Lead Channel Impedance Value: 410 Ohm
Lead Channel Pacing Threshold Amplitude: 0.75 V
Lead Channel Pacing Threshold Pulse Width: 0.5 ms
Lead Channel Sensing Intrinsic Amplitude: 12 mV
Lead Channel Setting Pacing Amplitude: 2.5 V
Lead Channel Setting Pacing Pulse Width: 0.5 ms
Lead Channel Setting Sensing Sensitivity: 0.5 mV
Pulse Gen Serial Number: 810021948

## 2021-08-09 DIAGNOSIS — E1121 Type 2 diabetes mellitus with diabetic nephropathy: Secondary | ICD-10-CM | POA: Diagnosis not present

## 2021-08-09 DIAGNOSIS — N183 Chronic kidney disease, stage 3 unspecified: Secondary | ICD-10-CM | POA: Diagnosis not present

## 2021-08-09 DIAGNOSIS — E785 Hyperlipidemia, unspecified: Secondary | ICD-10-CM | POA: Diagnosis not present

## 2021-08-09 DIAGNOSIS — J4540 Moderate persistent asthma, uncomplicated: Secondary | ICD-10-CM | POA: Diagnosis not present

## 2021-08-09 DIAGNOSIS — E08319 Diabetes mellitus due to underlying condition with unspecified diabetic retinopathy without macular edema: Secondary | ICD-10-CM | POA: Diagnosis not present

## 2021-08-09 DIAGNOSIS — J449 Chronic obstructive pulmonary disease, unspecified: Secondary | ICD-10-CM | POA: Diagnosis not present

## 2021-08-09 DIAGNOSIS — E1140 Type 2 diabetes mellitus with diabetic neuropathy, unspecified: Secondary | ICD-10-CM | POA: Diagnosis not present

## 2021-08-09 DIAGNOSIS — I129 Hypertensive chronic kidney disease with stage 1 through stage 4 chronic kidney disease, or unspecified chronic kidney disease: Secondary | ICD-10-CM | POA: Diagnosis not present

## 2021-08-09 DIAGNOSIS — J4520 Mild intermittent asthma, uncomplicated: Secondary | ICD-10-CM | POA: Diagnosis not present

## 2021-08-09 DIAGNOSIS — I5022 Chronic systolic (congestive) heart failure: Secondary | ICD-10-CM | POA: Diagnosis not present

## 2021-08-11 NOTE — Progress Notes (Signed)
Remote ICD transmission.   

## 2021-08-16 ENCOUNTER — Ambulatory Visit (HOSPITAL_COMMUNITY): Payer: Medicare Other | Attending: Internal Medicine

## 2021-08-16 ENCOUNTER — Other Ambulatory Visit: Payer: Self-pay

## 2021-08-16 DIAGNOSIS — I5042 Chronic combined systolic (congestive) and diastolic (congestive) heart failure: Secondary | ICD-10-CM

## 2021-08-16 LAB — ECHOCARDIOGRAM COMPLETE
Area-P 1/2: 6.32 cm2
S' Lateral: 5.5 cm

## 2021-08-20 ENCOUNTER — Other Ambulatory Visit: Payer: Self-pay | Admitting: Cardiovascular Disease

## 2021-09-01 ENCOUNTER — Telehealth: Payer: Self-pay | Admitting: Cardiovascular Disease

## 2021-09-01 NOTE — Telephone Encounter (Signed)
Courtney with Dr. Orest Dikes office is calling to inform Dr. [name] Cancel the pt's BNP was elevated at 537 and she had signs of edema in lower legs. Dr. Dema Severin doubled her furosemide for 1 week due to this. She also stated a call to the office was made a week prior and they never heard back. If feed back is necessary or the patient is needing to be seen in regards to this a callback can be made.

## 2021-09-02 ENCOUNTER — Other Ambulatory Visit: Payer: Self-pay | Admitting: Family Medicine

## 2021-09-02 DIAGNOSIS — Z1231 Encounter for screening mammogram for malignant neoplasm of breast: Secondary | ICD-10-CM

## 2021-09-02 NOTE — Telephone Encounter (Signed)
Left message for patient to call back  

## 2021-09-07 DIAGNOSIS — R490 Dysphonia: Secondary | ICD-10-CM | POA: Diagnosis not present

## 2021-09-07 DIAGNOSIS — R1310 Dysphagia, unspecified: Secondary | ICD-10-CM | POA: Diagnosis not present

## 2021-09-07 DIAGNOSIS — R1313 Dysphagia, pharyngeal phase: Secondary | ICD-10-CM | POA: Diagnosis not present

## 2021-09-07 DIAGNOSIS — R1314 Dysphagia, pharyngoesophageal phase: Secondary | ICD-10-CM | POA: Diagnosis not present

## 2021-09-14 NOTE — Telephone Encounter (Signed)
Per Dr. [name] Cancel "Her care was addressed 9/28 was supposed to see if Entresto affordable and f/u with Pharm D if so to start continue lasix and aldactone BNP minimally elevated"   Per Result Note from echo on 9/28. "Patient stated she cannot tolerate Entresto. Patient stated she was on it before and it just made her tired and she slept all the time. Patient stated cost was also an issue but being tired is the main reason she does not want to take it."  Dr. Tawanna Cooler' office never called back. Will close encounter.

## 2021-09-16 DIAGNOSIS — I5022 Chronic systolic (congestive) heart failure: Secondary | ICD-10-CM | POA: Diagnosis not present

## 2021-09-16 DIAGNOSIS — M47812 Spondylosis without myelopathy or radiculopathy, cervical region: Secondary | ICD-10-CM | POA: Diagnosis not present

## 2021-09-16 DIAGNOSIS — D638 Anemia in other chronic diseases classified elsewhere: Secondary | ICD-10-CM | POA: Diagnosis not present

## 2021-09-16 DIAGNOSIS — J4540 Moderate persistent asthma, uncomplicated: Secondary | ICD-10-CM | POA: Diagnosis not present

## 2021-09-16 DIAGNOSIS — D72829 Elevated white blood cell count, unspecified: Secondary | ICD-10-CM | POA: Diagnosis not present

## 2021-09-30 DIAGNOSIS — U071 COVID-19: Secondary | ICD-10-CM | POA: Diagnosis not present

## 2021-09-30 DIAGNOSIS — N183 Chronic kidney disease, stage 3 unspecified: Secondary | ICD-10-CM | POA: Diagnosis not present

## 2021-09-30 DIAGNOSIS — I129 Hypertensive chronic kidney disease with stage 1 through stage 4 chronic kidney disease, or unspecified chronic kidney disease: Secondary | ICD-10-CM | POA: Diagnosis not present

## 2021-09-30 DIAGNOSIS — E1140 Type 2 diabetes mellitus with diabetic neuropathy, unspecified: Secondary | ICD-10-CM | POA: Diagnosis not present

## 2021-10-03 ENCOUNTER — Ambulatory Visit: Payer: Medicare Other

## 2021-10-05 ENCOUNTER — Ambulatory Visit: Payer: Medicare Other | Admitting: Gastroenterology

## 2021-10-09 DIAGNOSIS — Z8616 Personal history of COVID-19: Secondary | ICD-10-CM | POA: Diagnosis not present

## 2021-10-09 DIAGNOSIS — R052 Subacute cough: Secondary | ICD-10-CM | POA: Diagnosis not present

## 2021-10-10 DIAGNOSIS — D0461 Carcinoma in situ of skin of right upper limb, including shoulder: Secondary | ICD-10-CM | POA: Diagnosis not present

## 2021-10-10 DIAGNOSIS — Z85828 Personal history of other malignant neoplasm of skin: Secondary | ICD-10-CM | POA: Diagnosis not present

## 2021-10-10 DIAGNOSIS — D1801 Hemangioma of skin and subcutaneous tissue: Secondary | ICD-10-CM | POA: Diagnosis not present

## 2021-10-10 DIAGNOSIS — L570 Actinic keratosis: Secondary | ICD-10-CM | POA: Diagnosis not present

## 2021-10-10 DIAGNOSIS — L821 Other seborrheic keratosis: Secondary | ICD-10-CM | POA: Diagnosis not present

## 2021-10-14 NOTE — Progress Notes (Signed)
[facility] [address] [place] [address]   CLINIC:  Medical Oncology/Hematology  CONSULT NOTE  Patient Care Team: Katherine Montana, MD as PCP - General (Family Medicine) Katherine Stade, MD as PCP - Cardiology (Cardiology) Katherine Stade, MD as Consulting Physician (Cardiology) Katherine Gauss Gerrit Friends, MD as Consulting Physician (Gastroenterology)  CHIEF COMPLAINTS/PURPOSE OF CONSULTATION:  Iron deficiency anemia  HISTORY OF PRESENTING ILLNESS:  Katherine Larson 75 y.o. female is here at the request of her primary care physician, Dr. Laurann Larson, due to iron deficiency anemia and leukocytosis.  Labs sent by her PCP were obtained on 09/16/2021, and showed Hgb 9.6/MCV 92.2.  WBC elevated at 13.3 with elevated neutrophils 10.1.  Iron saturation 11% with serum iron 42 and TIBC 373.  (Ferritin not checked).  Katherine Larson was previously seen by hematology (Katherine Larson at [facility] in 2015/2016) for her iron deficiency anemia and her leukocytosis.  She received Feraheme in 2015.  She was discharged from hematology clinic in 2016, as her iron deficiency anemia was stable on oral iron supplementation and her leukocytosis was ruled to be reactive leukocytosis, and had resolved.    She reports that she had an episode of dark bowel movements over the summer, and followed up with gastroenterology.  Her EGD (06/03/2021) and previous colonoscopy (04/29/2020) were negative for any signs of blood loss.  She received IV Feraheme x2 in August 2022.  She denies any history of blood transfusions.  She is currently taking Nu-Iron daily.  She takes daily omeprazole in the morning at the same time as her iron supplement.  She takes aspirin 81 mg daily, but is not on any other blood thinners.  She denies any NSAID medications.  [name] is symptomatic from her anemia with significant fatigue.  She rates her energy at about 40%, and reports that she has not had energy to do  housework or go grocery shopping for the past few months, and has been relying on her husband to complete these chores instead.  She also admits to restless leg symptoms at night, palpitations, and dyspnea on exertion.  She denies any chest pain, lightheadedness, or syncope.  Regarding her leukocytosis, she denies any B symptoms such as unexplained fever, chills, night sweats, unintentional weight loss.  She has not noted any new lumps or bumps.  She did have recent infection with COVID-19 on 09/29/2021.  She uses steroid inhaler (Trelegy) since October 2022.  She intermittently uses Kenalog cream for skin rash.  She is currently a non-smoker, previously smoked 0.5 PPD for 18 years, but quit in 1996.  She has underlying rheumatoid arthritis.  She had no prior history or diagnosis of cancer. Her age appropriate screening programs are up-to-date.  Her PMH is otherwise notable for rheumatoid arthritis, systolic CHF, type 2 diabetes mellitus, hyperlipidemia, hypertension, coronary artery disease with history of MI, GERD, and esophageal dysmotility, and asthma.  She lives at home with her husband.  She is retired from work as a Garment/textile technologist.  She is a former smoker, smoked 0.5 packs per day for 18 years, but quit in 1996.  She denies any alcohol or illicit drug use.  She reports that she has a daughter with melanoma plus another unspecified type. Half brother is deceased from leukemia.  Grandparents on both sides of her family had unspecified cancer.   MEDICAL HISTORY:  Past Medical History:  Diagnosis Date   Anemia    anemia of  chronic disease +/- IDA followed by Katherine Larson. received iron infusions in the past   Asthma    Borderline glaucoma    CAD (coronary artery disease)    a. s/p CABG 1996. b. low risk nuc 2011. c. LHC 04/2016 due to drop in EF -> occluded native LAD,  widely patent sequential LIMA-D1-LAD.   Chronic kidney disease    "mild stage of kidney disease"   Chronic systolic CHF  (congestive heart failure) (HCC)    Complication of anesthesia    DM2 (diabetes mellitus, type 2) ([place])    not on any medicine for this at this time, 05/2016   Dyspnea    with exertion   GERD (gastroesophageal reflux disease)    Gout    History of blood transfusion    History of hiatal hernia    in 20s   History of pneumonia    March, 2016   HLD (hyperlipidemia)    HTN (hypertension)    Hyperthyroidism 01/21/2016   Inappropriate sinus tachycardia    LBBB (left bundle branch block)    a. Seen in 04/2016   Lymphocytic colitis 04/2020   Macular degeneration    left   Myocardial infarction [facility])    1996   Neuropathy    Neuropathy    NICM (nonischemic cardiomyopathy) ([place])    a. Dx 04/2016 - EF 25-30%, diffuse HK, elevated LVEDP, mild MR, mod LAE.   NSVT (nonsustained ventricular tachycardia) (HCC)    PAT (paroxysmal atrial tachycardia) (HCC)    Pneumonia    PONV (postoperative nausea and vomiting)    "after just about every surgery I've had"   Rheumatoid arthritis ([place])    RA- hands   Seasonal allergies     SURGICAL HISTORY: Past Surgical History:  Procedure Laterality Date   ABDOMINAL HYSTERECTOMY     APPENDECTOMY     BACK SURGERY     BIOPSY  04/27/2020   Procedure: BIOPSY;  Surgeon: Daneil Dolin, MD;  [facility];  Service: Endoscopy;;  ascending colon biopsy   CARDIAC CATHETERIZATION N/A 05/01/2016   Procedure: Right/Left Heart Cath and Coronary/Graft Angiography;  Surgeon: Leonie Man, MD;  Location: [place] [facility];  Service: Cardiovascular;  Laterality: N/A;   CHOLECYSTECTOMY     COLONOSCOPY  11/2011   Dr. Magod:ext/int hemorrhoids, diverticulosis sigmoid colon and distal desc colon, mid-desc colon hyperplastic polyps.    COLONOSCOPY WITH PROPOFOL N/A 04/27/2020   Rourk: Diverticulosis, hemorrhoids, normal terminal ileum.  Random colon biopsies significant for chronic lymphocytic colitis   CORONARY ARTERY BYPASS GRAFT  1996   2 vessels    ESOPHAGOGASTRODUODENOSCOPY  11/2011   Dr. Watt Climes: small hiatal hernia, one non-bleeding superficial gastric ulcer, medium-sized diverticulum in area of papilla   ESOPHAGOGASTRODUODENOSCOPY N/A 12/29/2015   Dr. Gala Romney: Chronic inactive gastritis, normal esophagus status post dilation, duodenal diverticula   ESOPHAGOGASTRODUODENOSCOPY (EGD) WITH PROPOFOL N/A 07/24/2019   Dr. Gala Romney: Normal esophagus status post empiric dilation, small hiatal hernia, large D2 diverticulum   ESOPHAGOGASTRODUODENOSCOPY (EGD) WITH PROPOFOL N/A 06/03/2021   Procedure: ESOPHAGOGASTRODUODENOSCOPY (EGD) WITH PROPOFOL;  Surgeon: Daneil Dolin, MD;  [facility];  Service: Endoscopy;  Laterality: N/A;  9:00am   EYE SURGERY Bilateral    cataract removal   FOOT SURGERY     removal bone spur   FRACTURE SURGERY Right    arm   GIVENS CAPSULE STUDY  11/2012   Dr. Watt Climes: minimal gastritis, normal small bowel capsule   HERNIA REPAIR  hiatal hernia   ICD IMPLANT N/A 02/04/2021   Procedure: ICD IMPLANT;  Surgeon: Evans Lance, MD;  Location: [place] [facility];  Service: Cardiovascular;  Laterality: N/A;   MALONEY DILATION N/A 07/24/2019   Procedure: Venia Minks DILATION;  Surgeon: Daneil Dolin, MD;  [facility];  Service: Endoscopy;  Laterality: N/A;   MALONEY DILATION N/A 06/03/2021   Procedure: Venia Minks DILATION;  Surgeon: Daneil Dolin, MD;  [facility];  Service: Endoscopy;  Laterality: N/A;   MAXIMUM ACCESS (MAS)POSTERIOR LUMBAR INTERBODY FUSION (PLIF) 1 LEVEL N/A 08/14/2013   Procedure:  MAXIMUM ACCESS SURGERY(MAS) POSTERIOR LUMBAR INTERBODY FUSION LUMBAR THREE-FOUR ;  Surgeon: Eustace Moore, MD;  Location: [place] [facility];  Service: Neurosurgery;  Laterality: N/A;   MAXIMUM ACCESS SURGERY(MAS) POSTERIOR LUMBAR INTERBODY FUSION LUMBAR THREE-FOUR    PTCA     RIGHT/LEFT HEART CATH AND CORONARY ANGIOGRAPHY N/A 03/26/2020   Procedure: RIGHT/LEFT HEART CATH AND CORONARY ANGIOGRAPHY;  Surgeon: Leonie Man, MD;  Location: [place] [facility];  Service: Cardiovascular;  Laterality: N/A;   SPINAL CORD STIMULATOR BATTERY EXCHANGE N/A 01/19/2021   Procedure: Spinal cord stimulator battery change;  Surgeon: Reece Agar, MD;  Location: [place];  Service: Neurosurgery;  Laterality: N/A;   SPINAL CORD STIMULATOR INSERTION N/A 06/16/2016   Procedure: LUMBAR SPINAL CORD STIMULATOR INSERTION;  Surgeon: Clydell Hakim, MD;  Location: [place] [facility];  Service: Neurosurgery;  Laterality: N/A;  LUMBAR SPINAL CORD STIMULATOR INSERTION   tens  05/2016   TONSILLECTOMY      SOCIAL HISTORY: Social History   Socioeconomic History   Marital status: Married    Spouse name: Not on file   Number of children: 3   Years of education: Not on file   Highest education level: Not on file  Occupational History   Not on file  Tobacco Use   Smoking status: Former    Packs/day: 0.75    Years: 15.00    Pack years: 11.25    Types: Cigarettes    Quit date: 11/20/1994    Years since quitting: 26.9   Smokeless tobacco: Never   Tobacco comments:    Quit in 1996  Vaping Use   Vaping Use: Never used  Substance and Sexual Activity   Alcohol use: Never    Alcohol/week: 0.0 standard drinks   Drug use: Never   Sexual activity: Yes  Other Topics Concern   Not on file  Social History Narrative   2 living children, one deceased age 74   Right handed   One story home   Drinks coffee am   Social Determinants of Health   Financial Resource Strain: Not on file  Food Insecurity: Not on file  Transportation Needs: Not on file  Physical Activity: Not on file  Stress: Not on file  Social Connections: Not on file  Intimate Partner Violence: Not on file    FAMILY HISTORY: Family History  Problem Relation Age of Onset   Heart attack Father    Heart attack Mother    Bladder Cancer Brother    Cervical cancer Daughter        ?   Colon cancer Neg Hx     ALLERGIES:  is allergic to biaxin [clarithromycin] and  penicillins.  MEDICATIONS:  Current Outpatient Medications  Medication Sig Dispense Refill   albuterol (PROVENTIL HFA;VENTOLIN HFA) 108 (90 Base) MCG/ACT inhaler Take 2 puffs 3 times a day and every 4 hours as needed. (Patient  taking differently: Inhale 2 puffs into the lungs every 4 (four) hours as needed for wheezing or shortness of breath.)     allopurinol (ZYLOPRIM) 300 MG tablet Take 300 mg by mouth daily.     aspirin EC 81 MG EC tablet Take 1 tablet (81 mg total) by mouth daily.     atorvastatin (LIPITOR) 80 MG tablet Take 80 mg by mouth daily.     calcium carbonate (TUMS - DOSED IN MG ELEMENTAL CALCIUM) 500 MG chewable tablet Chew 1 tablet by mouth as needed for indigestion or heartburn.     Carboxymethylcellulose Sodium (THERATEARS OP) Place 1 drop into both eyes 3 (three) times daily as needed (dry eyes).     carvedilol (COREG) 6.25 MG tablet Take 6.25 mg by mouth 2 (two) times daily with a meal.     cholecalciferol (VITAMIN D3) 25 MCG (1000 UT) tablet Take 1,000 Units by mouth daily.     ferrous sulfate 325 (65 FE) MG tablet Take 325 mg by mouth daily with breakfast.     fexofenadine (ALLEGRA) 180 MG tablet Take 180 mg by mouth daily.     fluticasone (FLONASE) 50 MCG/ACT nasal spray Place 2 sprays into both nostrils daily.     furosemide (LASIX) 40 MG tablet Take 40 mg by mouth 2 (two) times daily.      gabapentin (NEURONTIN) 400 MG capsule Take 1 capsule (400 mg total) by mouth 3 (three) times daily. 90 capsule 5   HYDROcodone-acetaminophen (NORCO) 10-325 MG tablet Take 1 tablet by mouth every 6 (six) hours as needed for moderate pain.     Lancets (ONETOUCH DELICA PLUS [id number]) MISC      leflunomide (ARAVA) 20 MG tablet Take 20 mg by mouth daily.     losartan (COZAAR) 100 MG tablet Take 100 mg by mouth daily.     MAGNESIUM-OXIDE 400 (240 Mg) MG tablet TAKE 1 TABLET(400 MG) BY MOUTH DAILY 30 tablet 2   meclizine (ANTIVERT) 12.5 MG tablet Take 12.5 mg by mouth 3 (three) times daily  as needed for dizziness.     metFORMIN (GLUCOPHAGE-XR) 500 MG 24 hr tablet Take 1,000 mg by mouth 2 (two) times daily.     montelukast (SINGULAIR) 10 MG tablet Take 10 mg by mouth at bedtime.     Multiple Vitamins-Minerals (OCUVITE ADULT 50+ PO) Take 1 tablet by mouth daily.     nitroGLYCERIN (NITROSTAT) 0.4 MG SL tablet Place 1 tablet (0.4 mg total) under the tongue every 5 (five) minutes as needed for chest pain. Reported on 03/24/2016 25 tablet 3   potassium chloride SA (K-DUR,KLOR-CON) 20 MEQ tablet Take 1 tablet (20 mEq total) by mouth 2 (two) times daily. 40 tablet 11   spironolactone (ALDACTONE) 25 MG tablet Take 0.5 tablets (12.5 mg total) by mouth daily. 45 tablet 3   triamcinolone cream (KENALOG) 0.1 % Apply 1 application topically daily as needed (for irritation).     No current facility-administered medications for this visit.    REVIEW OF SYSTEMS:   Review of Systems  Constitutional:  Positive for fatigue. Negative for appetite change, chills, diaphoresis, fever and unexpected weight change.  HENT:   Positive for trouble swallowing (seeing ENT). Negative for lump/mass and nosebleeds.   Eyes:  Negative for eye problems.  Respiratory:  Positive for shortness of breath (with exertion). Negative for cough and hemoptysis.   Cardiovascular:  Positive for palpitations. Negative for chest pain and leg swelling.  Gastrointestinal:  Negative for abdominal pain, blood in  stool, constipation, diarrhea, nausea and vomiting.  Genitourinary:  Negative for hematuria.   Skin: Negative.   Neurological:  Positive for dizziness and numbness (hands and feet). Negative for headaches and light-headedness.  Hematological:  Does not bruise/bleed easily.     PHYSICAL EXAMINATION: ECOG PERFORMANCE STATUS: 2 - Symptomatic, <50% confined to bed  There were no vitals filed for this visit. There were no vitals filed for this visit.  Physical Exam Constitutional:      Appearance: Normal appearance.   HENT:     Head: Normocephalic and atraumatic.     Mouth/Throat:     Mouth: Mucous membranes are moist.  Eyes:     Extraocular Movements: Extraocular movements intact.     Pupils: Pupils are equal, round, and reactive to light.  Cardiovascular:     Rate and Rhythm: Regular rhythm. Tachycardia present.     Pulses: Normal pulses.     Heart sounds: Normal heart sounds.  Pulmonary:     Effort: Pulmonary effort is normal.     Breath sounds: Normal breath sounds.  Abdominal:     General: Bowel sounds are normal.     Palpations: Abdomen is soft.     Tenderness: There is no abdominal tenderness.  Musculoskeletal:        General: No swelling.     Right hand: Deformity present.     Left hand: Deformity present.     Right lower leg: No edema.     Left lower leg: No edema.     Comments: Contractures and nodules of bilateral hands in the setting of rheumatoid arthritis  Lymphadenopathy:     Cervical: No cervical adenopathy.  Skin:    General: Skin is warm and dry.  Neurological:     General: No focal deficit present.     Mental Status: She is alert and oriented to person, place, and time.  Psychiatric:        Mood and Affect: Mood normal.        Behavior: Behavior normal.      LABORATORY DATA:  I have reviewed the data as listed Recent Results (from the past 2160 hour(s))  CUP PACEART REMOTE DEVICE CHECK     Status: None   Collection Time: 08/05/21  2:27 AM  Result Value Ref Range   Date Time Interrogation Session [phone]    Pulse Generator Manufacturer OTHER    Pulse Gen Model [id number] Gallant VR    Pulse Gen Serial Number [id number]    Cl[facility] Name [facility]    Implantable Pulse Generator Type Implantable Cardiac Defibulator    Implantable Pulse Generator Implant Date [id number]    Implantable Lead Manufacturer [facility]    Implantable Lead Model [id number] Durata SJ4    Implantable Lead Serial Number [id number]    Implantable Lead Implant Date [id number]    Implantable Lead  Location Detail 1 UNKNOWN    Implantable Lead Location [phone]    Lead Channel Setting Sensing Sensitivity 0.5 mV   Lead Channel Setting Sensing Adaptation Mode Adaptive Sensing    Lead Channel Setting Pacing Pulse Width 0.5 ms   Lead Channel Setting Pacing Amplitude 2.5 V   Lead Channel Status NULL    Lead Channel Impedance Value 410 ohm   Lead Channel Sensing Intrinsic Amplitude 12.0 mV   Lead Channel Pacing Threshold Amplitude 0.75 V   Lead Channel Pacing Threshold Pulse Width 0.5 ms   HighPow Impedance 52 ohm   HighPow Imped Status NULL    Battery Status  MOS    Battery Remaining Longevity 115 mo   Battery Remaining Percentage 94.0 %   Battery Voltage 3.02 V   Brady Statistic RV Percent Paced 0 %  ECHOCARDIOGRAM COMPLETE     Status: None   Collection Time: 08/16/21  1:51 PM  Result Value Ref Range   Area-P 1/2 6.32 cm2   S' Lateral 5.50 cm    RADIOGRAPHIC STUDIES: I have personally reviewed the radiological images as listed and agreed with the findings in the report. No results found.   ASSESSMENT & PLAN: 1.  Normocytic anemia  - Previously seen by Katherine Larson for anemia of chronic disease +/- iron deficiency.  She was discharged from hematology clinic in 2016 as her anemia had been stable on Nu-iron supplement. - Labs sent by PCP (09/16/2021) shows normocytic anemia with Hgb 9.6/MCV 92.2, iron saturation 11%, TIBC 373.  (Ferritin not checked.)  She had normal creatinine 0.94 when checked on 06/23/2021. - No history of blood transfusion.  Received IV Feraheme in 2015 and in August 2022. - Currently taking iron supplement Nu-iron once daily.  Also takes pantoprazole and aspirin 81 mg daily at home. - Reports episode of dark stool in the summer 2022, and followed with GI.  No current symptoms of bright red blood per rectum or melena. - EGD (06/03/2021 by Dr. Gala Romney): Normal esophagus, stomach, duodenum - Colonoscopy (04/27/2020 by Dr. Gala Romney): Internal hemorrhoids and  diverticulosis - Symptomatic with significant fatigue, palpitations, restless legs, and dyspnea on exertion - Iron panel obtained today showed iron saturation 15% but with elevated ferritin 329.  High suspicion that elevated ferritin is acute phase reactant, as multiple inflammatory markers are also positive, and that elevated ferritin is masking a true iron deficiency.  Therefore, we will proceed with IV iron as noted below. - PLAN: Work-up for anemia with labs today as below.  RTC in 2 to 3 weeks to discuss results. - We will go ahead and schedule her for IV Feraheme x2, pending lab results today. - Patient instructed to continue oral iron supplementation, but to take it at least 4 hours after her PPI, along with a glass of orange juice to improve absorption. - Check CBC and CMP, as well as reticulocytes LDH - Iron panel and stool cards x3 - We will investigate other causes of anemia with nutritional panel (B12/methylmalonic acid, folate, copper, vitamin D) and myeloma panel (SPEP, IFE, light chains)  2.  Leukocytosis - She has a longstanding history of intermittent leukocytosis with WBC ranging from normal to 14.0, neutrophil predominant - Most recent CBC (09/16/2021) with WBC 13.3, ANC 10.1, normal platelets 344. - Previously seen by Katherine Larson, who thought patient to have reactive leukocytosis. - BCR/ABL by PCR testing was negative in January 2014 - She had COVID-19 infection in November 2022. - She uses steroid inhaler (Trelegy) and intermittent Kenalog cream. - She is a current non-smoker. - She has underlying rheumatoid arthritis. - She denies any B symptoms such as fever, chills, night sweats, unintentional weight loss.  No new lumps or bumps. - PLAN: Check CBC, and JAK2 with reflex. - Evaluate for underlying pro inflammatory states with ESR, CRP, and LDH. - We will discuss results at follow-up appointment in 2 to 3 weeks.   PLAN SUMMARY & DISPOSITION: Labs today Stool cards  x3 IV Feraheme x2 RTC in 2 to 3 weeks to discuss results and next steps  All questions were answered. The patient knows to call the clinic with any problems,  questions or concerns.   Medical decision making: Moderate  Time spent on visit: I spent 25 minutes counseling the patient face to face. The total time spent in the appointment was 40 minutes and more than 50% was on counseling.  I, Tarri Abernethy PA-C, have seen this patient in conjunction with Dr. Derek Jack. Greater than 50% of visit was performed by Dr. Delton Coombes.    Harriett Rush, PA-C 10/17/2021 6:33 PM  DR. Idalis Hoelting: I have independently examined this patient and formulated assessment and plan.  I agree with HPI written by Casey Burkitt, PA-C.  Patient evaluated for macrocytic anemia.  She has no history of CKD.  She did receive Feraheme on 07/07/2021 and 07/14/2021.  We will do anemia work-up including nutritional deficiencies and bone marrow infiltrative process.  Will also evaluate for leukocytosis by checking JAK2 V617F mutation.  Previous BCR/ABL was negative.  If the above work-up is negative, she will require bone marrow biopsy.

## 2021-10-16 ENCOUNTER — Other Ambulatory Visit: Payer: Self-pay | Admitting: Neurology

## 2021-10-17 ENCOUNTER — Other Ambulatory Visit: Payer: Self-pay

## 2021-10-17 ENCOUNTER — Inpatient Hospital Stay (HOSPITAL_COMMUNITY): Payer: Medicare Other | Attending: Hematology | Admitting: Hematology

## 2021-10-17 ENCOUNTER — Inpatient Hospital Stay (HOSPITAL_COMMUNITY): Payer: Medicare Other

## 2021-10-17 ENCOUNTER — Encounter (HOSPITAL_COMMUNITY): Payer: Self-pay | Admitting: Hematology

## 2021-10-17 VITALS — BP 115/76 | HR 109 | Temp 96.8°F | Resp 17 | Ht 62.5 in | Wt 139.3 lb

## 2021-10-17 DIAGNOSIS — D72829 Elevated white blood cell count, unspecified: Secondary | ICD-10-CM | POA: Diagnosis not present

## 2021-10-17 DIAGNOSIS — Z7984 Long term (current) use of oral hypoglycemic drugs: Secondary | ICD-10-CM | POA: Insufficient documentation

## 2021-10-17 DIAGNOSIS — D509 Iron deficiency anemia, unspecified: Secondary | ICD-10-CM | POA: Insufficient documentation

## 2021-10-17 DIAGNOSIS — Z7982 Long term (current) use of aspirin: Secondary | ICD-10-CM | POA: Insufficient documentation

## 2021-10-17 DIAGNOSIS — E785 Hyperlipidemia, unspecified: Secondary | ICD-10-CM | POA: Insufficient documentation

## 2021-10-17 DIAGNOSIS — I2510 Atherosclerotic heart disease of native coronary artery without angina pectoris: Secondary | ICD-10-CM | POA: Insufficient documentation

## 2021-10-17 DIAGNOSIS — I252 Old myocardial infarction: Secondary | ICD-10-CM | POA: Insufficient documentation

## 2021-10-17 DIAGNOSIS — M069 Rheumatoid arthritis, unspecified: Secondary | ICD-10-CM | POA: Insufficient documentation

## 2021-10-17 DIAGNOSIS — I5022 Chronic systolic (congestive) heart failure: Secondary | ICD-10-CM | POA: Insufficient documentation

## 2021-10-17 DIAGNOSIS — D72828 Other elevated white blood cell count: Secondary | ICD-10-CM

## 2021-10-17 DIAGNOSIS — D649 Anemia, unspecified: Secondary | ICD-10-CM

## 2021-10-17 DIAGNOSIS — G2581 Restless legs syndrome: Secondary | ICD-10-CM | POA: Diagnosis not present

## 2021-10-17 DIAGNOSIS — E119 Type 2 diabetes mellitus without complications: Secondary | ICD-10-CM | POA: Insufficient documentation

## 2021-10-17 DIAGNOSIS — I110 Hypertensive heart disease with heart failure: Secondary | ICD-10-CM | POA: Insufficient documentation

## 2021-10-17 DIAGNOSIS — Z79899 Other long term (current) drug therapy: Secondary | ICD-10-CM | POA: Diagnosis not present

## 2021-10-17 DIAGNOSIS — R002 Palpitations: Secondary | ICD-10-CM | POA: Insufficient documentation

## 2021-10-17 DIAGNOSIS — Z87891 Personal history of nicotine dependence: Secondary | ICD-10-CM | POA: Diagnosis not present

## 2021-10-17 HISTORY — DX: Anemia, unspecified: D64.9

## 2021-10-17 LAB — CBC WITH DIFFERENTIAL/PLATELET
Abs Immature Granulocytes: 0.08 10*3/uL — ABNORMAL HIGH (ref 0.00–0.07)
Basophils Absolute: 0.1 10*3/uL (ref 0.0–0.1)
Basophils Relative: 1 %
Eosinophils Absolute: 0.2 10*3/uL (ref 0.0–0.5)
Eosinophils Relative: 2 %
HCT: 29 % — ABNORMAL LOW (ref 36.0–46.0)
Hemoglobin: 8.9 g/dL — ABNORMAL LOW (ref 12.0–15.0)
Immature Granulocytes: 1 %
Lymphocytes Relative: 17 %
Lymphs Abs: 1.9 10*3/uL (ref 0.7–4.0)
MCH: 31 pg (ref 26.0–34.0)
MCHC: 30.7 g/dL (ref 30.0–36.0)
MCV: 101 fL — ABNORMAL HIGH (ref 80.0–100.0)
Monocytes Absolute: 0.7 10*3/uL (ref 0.1–1.0)
Monocytes Relative: 6 %
Neutro Abs: 8.1 10*3/uL — ABNORMAL HIGH (ref 1.7–7.7)
Neutrophils Relative %: 73 %
Platelets: 326 10*3/uL (ref 150–400)
RBC: 2.87 MIL/uL — ABNORMAL LOW (ref 3.87–5.11)
RDW: 18.1 % — ABNORMAL HIGH (ref 11.5–15.5)
WBC: 11 10*3/uL — ABNORMAL HIGH (ref 4.0–10.5)
nRBC: 0 % (ref 0.0–0.2)

## 2021-10-17 LAB — COMPREHENSIVE METABOLIC PANEL
ALT: 23 U/L (ref 0–44)
AST: 18 U/L (ref 15–41)
Albumin: 3.3 g/dL — ABNORMAL LOW (ref 3.5–5.0)
Alkaline Phosphatase: 120 U/L (ref 38–126)
Anion gap: 10 (ref 5–15)
BUN: 21 mg/dL (ref 8–23)
CO2: 25 mmol/L (ref 22–32)
Calcium: 9.3 mg/dL (ref 8.9–10.3)
Chloride: 100 mmol/L (ref 98–111)
Creatinine, Ser: 0.95 mg/dL (ref 0.44–1.00)
GFR, Estimated: >60 mL/min (ref >60)
Glucose, Bld: 123 mg/dL — ABNORMAL HIGH (ref 70–99)
Potassium: 4.9 mmol/L (ref 3.5–5.1)
Sodium: 135 mmol/L (ref 135–145)
Total Bilirubin: 0.4 mg/dL (ref 0.3–1.2)
Total Protein: 6.7 g/dL (ref 6.5–8.1)

## 2021-10-17 LAB — RETICULOCYTES
Immature Retic Fract: 20.7 % — ABNORMAL HIGH (ref 2.3–15.9)
RBC.: 2.88 MIL/uL — ABNORMAL LOW (ref 3.87–5.11)
Retic Count, Absolute: 86.7 10*3/uL (ref 19.0–186.0)
Retic Ct Pct: 3 % (ref 0.4–3.1)

## 2021-10-17 LAB — VITAMIN B12: Vitamin B-12: 2718 pg/mL — ABNORMAL HIGH (ref 180–914)

## 2021-10-17 LAB — IRON AND TIBC
Iron: 59 ug/dL (ref 28–170)
Saturation Ratios: 15 % (ref 10.4–31.8)
TIBC: 386 ug/dL (ref 250–450)
UIBC: 327 ug/dL

## 2021-10-17 LAB — FOLATE: Folate: 7.3 ng/mL (ref >5.9)

## 2021-10-17 LAB — SEDIMENTATION RATE: Sed Rate: 76 mm/hr — ABNORMAL HIGH (ref 0–22)

## 2021-10-17 LAB — FERRITIN: Ferritin: 329 ng/mL — ABNORMAL HIGH (ref 11–307)

## 2021-10-17 LAB — C-REACTIVE PROTEIN: CRP: 2.4 mg/dL — ABNORMAL HIGH (ref <1.0)

## 2021-10-17 LAB — LACTATE DEHYDROGENASE: LDH: 156 U/L (ref 98–192)

## 2021-10-17 NOTE — Patient Instructions (Signed)
[place] at [facility] Discharge Instructions  You were seen today by Dr. Delton Coombes and Tarri Abernethy PA-C for your iron deficiency anemia and elevated white blood cells.    We will check several lab tests today to evaluate the cause of your anemia.  We will check stool cards to see if you have any signs of blood in your stool.  We will schedule you for IV iron (Feraheme x 2 doses), pending the results of your labs today.  LABS: Labs today before leaving the hospital building   OTHER TESTS: Stool cards  MEDICATIONS: Continue iron pill, but take it at a DIFFERENT TIME of ay than your Omeprazole.  You can also take a glass of orange juice with your iron pill to help your body absorb it.  FOLLOW-UP APPOINTMENT: Office visit in 3 weeks to discuss results   Thank you for choosing [place] at [facility] to provide your oncology and hematology care.  To afford each patient quality time with our provider, please arrive at least 15 minutes before your scheduled appointment time.   If you have a lab appointment with the [place] please come in thru the Main Entrance and check in at the main information desk.  You need to re-schedule your appointment should you arrive 10 or more minutes late.  We strive to give you quality time with our providers, and arriving late affects you and other patients whose appointments are after yours.  Also, if you no show three or more times for appointments you may be dismissed from the clinic at the providers discretion.     Again, thank you for choosing [facility].  Our hope is that these requests will decrease the amount of time that you wait before being seen by our physicians.       _____________________________________________________________  Should you have questions after your visit to [facility], please contact our office at [phone] and follow the prompts.  Our  office hours are 8:00 a.m. and 4:30 p.m. Monday - Friday.  Please note that voicemails left after 4:00 p.m. may not be returned until the following business day.  We are closed weekends and major holidays.  You do have access to a nurse 24-7, just call the main number to the clinic [phone] and do not press any options, hold on the line and a nurse will answer the phone.    For prescription refill requests, have your pharmacy contact our office and allow 72 hours.    Due to Covid, you will need to wear a mask upon entering the hospital. If you do not have a mask, a mask will be given to you at the Main Entrance upon arrival. For doctor visits, patients may have 1 support person age 74 or older with them. For treatment visits, patients can not have anyone with them due to social distancing guidelines and our immunocompromised population.

## 2021-10-18 ENCOUNTER — Ambulatory Visit (HOSPITAL_COMMUNITY): Payer: Medicare Other

## 2021-10-18 LAB — KAPPA/LAMBDA LIGHT CHAINS
Kappa free light chain: 41.1 mg/L — ABNORMAL HIGH (ref 3.3–19.4)
Kappa, lambda light chain ratio: 1.61 (ref 0.26–1.65)
Lambda free light chains: 25.6 mg/L (ref 5.7–26.3)

## 2021-10-19 ENCOUNTER — Ambulatory Visit: Payer: Medicare Other | Admitting: Neurology

## 2021-10-19 DIAGNOSIS — Z9689 Presence of other specified functional implants: Secondary | ICD-10-CM | POA: Diagnosis not present

## 2021-10-19 DIAGNOSIS — M961 Postlaminectomy syndrome, not elsewhere classified: Secondary | ICD-10-CM | POA: Diagnosis not present

## 2021-10-19 DIAGNOSIS — M47816 Spondylosis without myelopathy or radiculopathy, lumbar region: Secondary | ICD-10-CM | POA: Diagnosis not present

## 2021-10-19 LAB — PROTEIN ELECTROPHORESIS, SERUM
A/G Ratio: 1.1 (ref 0.7–1.7)
Albumin ELP: 3.2 g/dL (ref 2.9–4.4)
Alpha-1-Globulin: 0.3 g/dL (ref 0.0–0.4)
Alpha-2-Globulin: 1.1 g/dL — ABNORMAL HIGH (ref 0.4–1.0)
Beta Globulin: 1 g/dL (ref 0.7–1.3)
Gamma Globulin: 0.5 g/dL (ref 0.4–1.8)
Globulin, Total: 2.9 g/dL (ref 2.2–3.9)
Total Protein ELP: 6.1 g/dL (ref 6.0–8.5)

## 2021-10-19 LAB — METHYLMALONIC ACID, SERUM: Methylmalonic Acid, Quantitative: 125 nmol/L (ref 0–378)

## 2021-10-19 LAB — COPPER, SERUM: Copper: 212 ug/dL — ABNORMAL HIGH (ref 80–158)

## 2021-10-20 ENCOUNTER — Ambulatory Visit (HOSPITAL_COMMUNITY): Payer: Medicare Other

## 2021-10-21 ENCOUNTER — Inpatient Hospital Stay (HOSPITAL_COMMUNITY): Payer: Medicare Other | Attending: Hematology

## 2021-10-21 ENCOUNTER — Other Ambulatory Visit: Payer: Self-pay

## 2021-10-21 VITALS — BP 102/58 | HR 89 | Temp 97.4°F | Resp 18 | Ht 62.0 in | Wt 137.0 lb

## 2021-10-21 DIAGNOSIS — D509 Iron deficiency anemia, unspecified: Secondary | ICD-10-CM | POA: Diagnosis not present

## 2021-10-21 DIAGNOSIS — D649 Anemia, unspecified: Secondary | ICD-10-CM

## 2021-10-21 DIAGNOSIS — D72829 Elevated white blood cell count, unspecified: Secondary | ICD-10-CM | POA: Diagnosis not present

## 2021-10-21 LAB — OCCULT BLOOD X 1 CARD TO LAB, STOOL
Fecal Occult Bld: NEGATIVE
Fecal Occult Bld: NEGATIVE
Fecal Occult Bld: NEGATIVE

## 2021-10-21 LAB — IMMUNOFIXATION ELECTROPHORESIS
IgA: 165 mg/dL (ref 64–422)
IgG (Immunoglobin G), Serum: 676 mg/dL (ref 586–1602)
IgM (Immunoglobulin M), Srm: 64 mg/dL (ref 26–217)
Total Protein ELP: 6.5 g/dL (ref 6.0–8.5)

## 2021-10-21 MED ORDER — ACETAMINOPHEN 325 MG PO TABS
650.0000 mg | ORAL_TABLET | Freq: Once | ORAL | Status: AC
  Administered 2021-10-21: 650 mg via ORAL
  Filled 2021-10-21: qty 2

## 2021-10-21 MED ORDER — SODIUM CHLORIDE 0.9 % IV SOLN: Freq: Once | INTRAVENOUS | Status: AC

## 2021-10-21 MED ORDER — SODIUM CHLORIDE 0.9 % IV SOLN
510.0000 mg | Freq: Once | INTRAVENOUS | Status: AC
  Administered 2021-10-21: 510 mg via INTRAVENOUS
  Filled 2021-10-21: qty 510

## 2021-10-21 MED ORDER — LORATADINE 10 MG PO TABS
10.0000 mg | ORAL_TABLET | Freq: Once | ORAL | Status: AC
  Administered 2021-10-21: 10 mg via ORAL
  Filled 2021-10-21: qty 1

## 2021-10-21 NOTE — Patient Instructions (Signed)
[facility]  Discharge Instructions: Thank you for choosing [facility] to provide your oncology and hematology care.  If you have a lab appointment with the Cancer Center, please come in thru the Main Entrance and check in at the main information desk.  Wear comfortable clothing and clothing appropriate for easy access to any Portacath or PICC line.   We strive to give you quality time with your provider. You may need to reschedule your appointment if you arrive late (15 or more minutes).  Arriving late affects you and other patients whose appointments are after yours.  Also, if you miss three or more appointments without notifying the office, you may be dismissed from the clinic at the provider's discretion.      For prescription refill requests, have your pharmacy contact our office and allow 72 hours for refills to be completed.        To help prevent nausea and vomiting after your treatment, we encourage you to take your nausea medication as directed.  BELOW ARE SYMPTOMS THAT SHOULD BE REPORTED IMMEDIATELY: *FEVER GREATER THAN 100.4 F (38 C) OR HIGHER *CHILLS OR SWEATING *NAUSEA AND VOMITING THAT IS NOT CONTROLLED WITH YOUR NAUSEA MEDICATION *UNUSUAL SHORTNESS OF BREATH *UNUSUAL BRUISING OR BLEEDING *URINARY PROBLEMS (pain or burning when urinating, or frequent urination) *BOWEL PROBLEMS (unusual diarrhea, constipation, pain near the anus) TENDERNESS IN MOUTH AND THROAT WITH OR WITHOUT PRESENCE OF ULCERS (sore throat, sores in mouth, or a toothache) UNUSUAL RASH, SWELLING OR PAIN  UNUSUAL VAGINAL DISCHARGE OR ITCHING   Items with * indicate a potential emergency and should be followed up as soon as possible or go to the Emergency Department if any problems should occur.  Please show the CHEMOTHERAPY ALERT CARD or IMMUNOTHERAPY ALERT CARD at check-in to the Emergency Department and triage nurse.  Should you have questions after your visit or need to cancel  or reschedule your appointment, please contact [facility] [phone]  and follow the prompts.  Office hours are 8:00 a.m. to 4:30 p.m. Monday - Friday. Please note that voicemails left after 4:00 p.m. may not be returned until the following business day.  We are closed weekends and major holidays. You have access to a nurse at all times for urgent questions. Please call the main number to the clinic [phone] and follow the prompts.  For any non-urgent questions, you may also contact your provider using MyChart. We now offer e-Visits for anyone 18 and older to request care online for non-urgent symptoms. For details visit [email or web address].   Also download the MyChart app! Go to the app store, search "MyChart", open the app, select [facility], and log in with your MyChart username and password.  Due to Covid, a mask is required upon entering the hospital/clinic. If you do not have a mask, one will be given to you upon arrival. For doctor visits, patients may have 1 support person aged 18 or older with them. For treatment visits, patients cannot have anyone with them due to current Covid guidelines and our immunocompromised population.  

## 2021-10-21 NOTE — Progress Notes (Signed)
Patient presents today for Feraheme infusion.  Patient is in satisfactory condition with no complaints voiced.  Vital signs are stable.  We will proceed with treatment per MD orders.   Patient tolerated treatment well with no complaints voiced.  Patient left via wheelchair in stable condition.  Vital signs stable at discharge.  Follow up as scheduled.

## 2021-10-25 ENCOUNTER — Ambulatory Visit (HOSPITAL_COMMUNITY): Payer: Medicare Other

## 2021-10-26 LAB — CALR + JAK2 E12-15 + MPL (REFLEXED)

## 2021-10-26 LAB — JAK2 V617F, W REFLEX TO CALR/E12/MPL

## 2021-10-27 ENCOUNTER — Ambulatory Visit (HOSPITAL_COMMUNITY): Payer: Medicare Other

## 2021-10-28 ENCOUNTER — Other Ambulatory Visit: Payer: Self-pay

## 2021-10-28 ENCOUNTER — Inpatient Hospital Stay (HOSPITAL_COMMUNITY): Payer: Medicare Other

## 2021-10-28 VITALS — BP 90/65 | HR 83 | Temp 97.9°F | Resp 18

## 2021-10-28 DIAGNOSIS — D72829 Elevated white blood cell count, unspecified: Secondary | ICD-10-CM | POA: Diagnosis not present

## 2021-10-28 DIAGNOSIS — D649 Anemia, unspecified: Secondary | ICD-10-CM

## 2021-10-28 DIAGNOSIS — D509 Iron deficiency anemia, unspecified: Secondary | ICD-10-CM | POA: Diagnosis not present

## 2021-10-28 MED ORDER — SODIUM CHLORIDE 0.9 % IV SOLN
510.0000 mg | Freq: Once | INTRAVENOUS | Status: AC
  Administered 2021-10-28: 510 mg via INTRAVENOUS
  Filled 2021-10-28: qty 17

## 2021-10-28 MED ORDER — ACETAMINOPHEN 325 MG PO TABS
650.0000 mg | ORAL_TABLET | Freq: Once | ORAL | Status: AC
  Administered 2021-10-28: 650 mg via ORAL
  Filled 2021-10-28: qty 2

## 2021-10-28 MED ORDER — SODIUM CHLORIDE 0.9 % IV SOLN: Freq: Once | INTRAVENOUS | Status: AC

## 2021-10-28 MED ORDER — LORATADINE 10 MG PO TABS
10.0000 mg | ORAL_TABLET | Freq: Once | ORAL | Status: AC
  Administered 2021-10-28: 10 mg via ORAL
  Filled 2021-10-28: qty 1

## 2021-10-28 NOTE — Progress Notes (Signed)
Patient presents today for Feraheme infusion per providers order.  Vital signs WNL.  Patient has no new complaints at this time.  Peripheral IV started and blood return noted pre and post infusion.  Feraheme infusion given today per MD orders.  Stable during infusion without adverse affects.  Vital signs stable.  No complaints at this time.  Discharge from clinic via wheelchair in stable condition.  Alert and oriented X 3.  Follow up with [facility] as scheduled.

## 2021-10-28 NOTE — Patient Instructions (Signed)
[facility]  Discharge Instructions: °Thank you for choosing [facility] to provide your oncology and hematology care.  °If you have a lab appointment with the Cancer Center, please come in thru the Main Entrance and check in at the main information desk. ° °Wear comfortable clothing and clothing appropriate for easy access to any Portacath or PICC line.  ° °We strive to give you quality time with your provider. You may need to reschedule your appointment if you arrive late (15 or more minutes).  Arriving late affects you and other patients whose appointments are after yours.  Also, if you miss three or more appointments without notifying the office, you may be dismissed from the clinic at the provider’s discretion.    °  °For prescription refill requests, have your pharmacy contact our office and allow 72 hours for refills to be completed.   ° °Today you received the following chemotherapy and/or immunotherapy agents Feraheme    °  °To help prevent nausea and vomiting after your treatment, we encourage you to take your nausea medication as directed. ° °BELOW ARE SYMPTOMS THAT SHOULD BE REPORTED IMMEDIATELY: °*FEVER GREATER THAN 100.4 F (38 °C) OR HIGHER °*CHILLS OR SWEATING °*NAUSEA AND VOMITING THAT IS NOT CONTROLLED WITH YOUR NAUSEA MEDICATION °*UNUSUAL SHORTNESS OF BREATH °*UNUSUAL BRUISING OR BLEEDING °*URINARY PROBLEMS (pain or burning when urinating, or frequent urination) °*BOWEL PROBLEMS (unusual diarrhea, constipation, pain near the anus) °TENDERNESS IN MOUTH AND THROAT WITH OR WITHOUT PRESENCE OF ULCERS (sore throat, sores in mouth, or a toothache) °UNUSUAL RASH, SWELLING OR PAIN  °UNUSUAL VAGINAL DISCHARGE OR ITCHING  ° °Items with * indicate a potential emergency and should be followed up as soon as possible or go to the Emergency Department if any problems should occur. ° °Please show the CHEMOTHERAPY ALERT CARD or IMMUNOTHERAPY ALERT CARD at check-in to the Emergency  Department and triage nurse. ° °Should you have questions after your visit or need to cancel or reschedule your appointment, please contact [facility] [phone]  and follow the prompts.  Office hours are 8:00 a.m. to 4:30 p.m. Monday - Friday. Please note that voicemails left after 4:00 p.m. may not be returned until the following business day.  We are closed weekends and major holidays. You have access to a nurse at all times for urgent questions. Please call the main number to the clinic [phone] and follow the prompts. ° °For any non-urgent questions, you may also contact your provider using MyChart. We now offer e-Visits for anyone 18 and older to request care online for non-urgent symptoms. For details visit [email or web address]. °  °Also download the MyChart app! Go to the app store, search "MyChart", open the app, select [facility], and log in with your MyChart username and password. ° °Due to Covid, a mask is required upon entering the hospital/clinic. If you do not have a mask, one will be given to you upon arrival. For doctor visits, patients may have 1 support person aged 18 or older with them. For treatment visits, patients cannot have anyone with them due to current Covid guidelines and our immunocompromised population.  °

## 2021-10-31 DIAGNOSIS — M1711 Unilateral primary osteoarthritis, right knee: Secondary | ICD-10-CM | POA: Diagnosis not present

## 2021-10-31 DIAGNOSIS — M1712 Unilateral primary osteoarthritis, left knee: Secondary | ICD-10-CM | POA: Diagnosis not present

## 2021-10-31 DIAGNOSIS — G5602 Carpal tunnel syndrome, left upper limb: Secondary | ICD-10-CM | POA: Diagnosis not present

## 2021-11-04 ENCOUNTER — Ambulatory Visit (INDEPENDENT_AMBULATORY_CARE_PROVIDER_SITE_OTHER): Payer: Medicare Other

## 2021-11-04 ENCOUNTER — Ambulatory Visit: Payer: Medicare Other

## 2021-11-04 DIAGNOSIS — I255 Ischemic cardiomyopathy: Secondary | ICD-10-CM | POA: Diagnosis not present

## 2021-11-04 LAB — CUP PACEART REMOTE DEVICE CHECK
Battery Remaining Longevity: 110 mo
Battery Remaining Percentage: 91 %
Battery Voltage: 3.02 V
Brady Statistic RV Percent Paced: 0 %
Date Time Interrogation Session: 20221216010043
HighPow Impedance: 59 Ohm
Implantable Lead Implant Date: 20220318
Implantable Lead Location: 753860
Implantable Pulse Generator Implant Date: 20220318
Lead Channel Impedance Value: 380 Ohm
Lead Channel Pacing Threshold Amplitude: 0.75 V
Lead Channel Pacing Threshold Pulse Width: 0.5 ms
Lead Channel Sensing Intrinsic Amplitude: 12 mV
Lead Channel Setting Pacing Amplitude: 2.5 V
Lead Channel Setting Pacing Pulse Width: 0.5 ms
Lead Channel Setting Sensing Sensitivity: 0.5 mV
Pulse Gen Serial Number: 810021948

## 2021-11-07 NOTE — Progress Notes (Signed)
Iron deficiency anemia  [address], [place] [address]   CLINIC:  Medical Oncology/Hematology  PCP:  Harlan Stains, MD [place] [address] [phone]   REASON FOR VISIT:  Follow-up for iron deficiency anemia and leukocytosis  CURRENT THERAPY: Intermittent IV iron infusions  INTERVAL HISTORY:  Katherine Larson 75 y.o. female returns for routine follow-up of her iron deficiency anemia and leukocytosis.  She was seen for initial consultation by Dr. Delton Coombes and Tarri Abernethy PA-C on 10/17/2021.  She received IV iron supplementation with Feraheme x2 on 10/21/2021 and 10/28/2021.  At today's visit, she reports feeling about the same.  No recent hospitalizations, surgeries, or changes in baseline health status.  She did not note any improvement or change in her symptoms after her IV iron.  She continues to experience fatigue, palpitations, restless legs, dyspnea on exertion.  She has not had any chest pain, syncope, or pica cravings.  No bleeding episodes such as epistaxis, hematemesis, hematochezia, or melena.  She has 25% energy and 25% appetite. She endorses that she is maintaining a stable weight.   REVIEW OF SYSTEMS:  Review of Systems  Constitutional:  Positive for fatigue. Negative for appetite change, chills, diaphoresis, fever and unexpected weight change.  HENT:   Negative for lump/mass and nosebleeds.   Eyes:  Negative for eye problems.  Respiratory:  Negative for cough, hemoptysis and shortness of breath.   Cardiovascular:  Positive for palpitations. Negative for chest pain and leg swelling.  Gastrointestinal:  Negative for abdominal pain, blood in stool, constipation, diarrhea, nausea and vomiting.  Genitourinary:  Negative for hematuria.   Musculoskeletal:  Positive for arthralgias and neck pain.  Skin: Negative.   Neurological:  Negative for dizziness, headaches and light-headedness.  Hematological:   Does not bruise/bleed easily.     PAST MEDICAL/SURGICAL HISTORY:  Past Medical History:  Diagnosis Date   Anemia    anemia of chronic disease +/- IDA followed by Dr. Earlie Server. received iron infusions in the past   Asthma    Borderline glaucoma    CAD (coronary artery disease)    a. s/p CABG 1996. b. low risk nuc 2011. c. LHC 04/2016 due to drop in EF -> occluded native LAD,  widely patent sequential LIMA-D1-LAD.   Chronic kidney disease    "mild stage of kidney disease"   Chronic systolic CHF (congestive heart failure) (HCC)    Complication of anesthesia    DM2 (diabetes mellitus, type 2) ([place])    not on any medicine for this at this time, 05/2016   Dyspnea    with exertion   GERD (gastroesophageal reflux disease)    Gout    History of blood transfusion    History of hiatal hernia    in 20s   History of pneumonia    March, 2016   HLD (hyperlipidemia)    HTN (hypertension)    Hyperthyroidism 01/21/2016   Inappropriate sinus tachycardia    LBBB (left bundle branch block)    a. 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 Dx 04/2016 - EF 25-30%, diffuse HK, elevated LVEDP, mild MR, mod LAE.   Normocytic anemia 10/17/2021   NSVT (nonsustained ventricular tachycardia)    PAT (paroxysmal atrial tachycardia) ([place])  Pneumonia    PONV (postoperative nausea and vomiting)    "after just about every surgery I've had"   Rheumatoid arthritis ([place])    RA- hands   Seasonal allergies    Past Surgical History:  Procedure Laterality Date   ABDOMINAL HYSTERECTOMY     APPENDECTOMY     BACK SURGERY     BIOPSY  04/27/2020   Procedure: BIOPSY;  Surgeon: Daneil Dolin, MD;  [facility];  Service: Endoscopy;;  ascending colon biopsy   CARDIAC CATHETERIZATION N/A 05/01/2016   Procedure: Right/Left Heart Cath and Coronary/Graft  Angiography;  Surgeon: Leonie Man, MD;  Location: [place];  Service: Cardiovascular;  Laterality: N/A;   CHOLECYSTECTOMY     COLONOSCOPY  11/2011   Dr. Magod:ext/int hemorrhoids, diverticulosis sigmoid colon and distal desc colon, mid-desc colon hyperplastic polyps.    COLONOSCOPY WITH PROPOFOL N/A 04/27/2020   Rourk: Diverticulosis, hemorrhoids, normal terminal ileum.  Random colon biopsies significant for chronic lymphocytic colitis   CORONARY ARTERY BYPASS GRAFT  1996   2 vessels   ESOPHAGOGASTRODUODENOSCOPY  11/2011   Dr. Watt Climes: small hiatal hernia, one non-bleeding superficial gastric ulcer, medium-sized diverticulum in area of papilla   ESOPHAGOGASTRODUODENOSCOPY N/A 12/29/2015   Dr. Gala Romney: Chronic inactive gastritis, normal esophagus status post dilation, duodenal diverticula   ESOPHAGOGASTRODUODENOSCOPY (EGD) WITH PROPOFOL N/A 07/24/2019   Dr. Gala Romney: Normal esophagus status post empiric dilation, small hiatal hernia, large D2 diverticulum   ESOPHAGOGASTRODUODENOSCOPY (EGD) WITH PROPOFOL N/A 06/03/2021   Procedure: ESOPHAGOGASTRODUODENOSCOPY (EGD) WITH PROPOFOL;  Surgeon: Daneil Dolin, MD;  [facility];  Service: Endoscopy;  Laterality: N/A;  9:00am   EYE SURGERY Bilateral    cataract removal   FOOT SURGERY     removal bone spur   FRACTURE SURGERY Right    arm   GIVENS CAPSULE STUDY  11/2012   Dr. Watt Climes: minimal gastritis, normal small bowel capsule   HERNIA REPAIR     hiatal hernia   ICD IMPLANT N/A 02/04/2021   Procedure: ICD IMPLANT;  Surgeon: Evans Lance, MD;  Location: [facility];  Service: Cardiovascular;  Laterality: N/A;   MALONEY DILATION N/A 07/24/2019   Procedure: Venia Minks DILATION;  Surgeon: Daneil Dolin, MD;  [facility];  Service: Endoscopy;  Laterality: N/A;   MALONEY DILATION N/A 06/03/2021   Procedure: Venia Minks DILATION;  Surgeon: Daneil Dolin, MD;  [facility];  Service: Endoscopy;  Laterality: N/A;    MAXIMUM ACCESS (MAS)POSTERIOR LUMBAR INTERBODY FUSION (PLIF) 1 LEVEL N/A 08/14/2013   Procedure:  MAXIMUM ACCESS SURGERY(MAS) POSTERIOR LUMBAR INTERBODY FUSION LUMBAR THREE-FOUR ;  Surgeon: Eustace Moore, MD;  Location: [facility];  Service: Neurosurgery;  Laterality: N/A;   MAXIMUM ACCESS SURGERY(MAS) POSTERIOR LUMBAR INTERBODY FUSION LUMBAR THREE-FOUR    PTCA     RIGHT/LEFT HEART CATH AND CORONARY ANGIOGRAPHY N/A 03/26/2020   Procedure: RIGHT/LEFT HEART CATH AND CORONARY ANGIOGRAPHY;  Surgeon: Leonie Man, MD;  Location: [facility];  Service: Cardiovascular;  Laterality: N/A;   SPINAL CORD STIMULATOR BATTERY EXCHANGE N/A 01/19/2021   Procedure: Spinal cord stimulator battery change;  Surgeon: Reece Agar, MD;  Location: [facility];  Service: Neurosurgery;  Laterality: N/A;   SPINAL CORD STIMULATOR INSERTION N/A 06/16/2016   Procedure: LUMBAR SPINAL CORD STIMULATOR INSERTION;  Surgeon: Clydell Hakim, MD;  Location: [facility];  Service: Neurosurgery;  Laterality: N/A;  LUMBAR SPINAL CORD STIMULATOR INSERTION   tens  05/2016   TONSILLECTOMY       SOCIAL HISTORY:  Social History   Socioeconomic History   Marital status: Married    Spouse name: Not on file   Number of children: 3   Years of education: Not on file   Highest education level: Not on file  Occupational History   Not on file  Tobacco Use   Smoking status: Former    Packs/day: 0.75    Years: 15.00    Pack years: 11.25    Types: Cigarettes    Quit date: 11/20/1994    Years since quitting: 26.9   Smokeless tobacco: Never   Tobacco comments:    Quit in 1996  Vaping Use   Vaping Use: Never used  Substance and Sexual Activity   Alcohol use: Never    Alcohol/week: 0.0 standard drinks   Drug use: Never   Sexual activity: Yes  Other Topics Concern   Not on file  Social History Narrative   2 living children, one deceased age 72   Right handed   One story home   Drinks coffee am   Social Determinants of Health    Financial Resource Strain: Not on file  Food Insecurity: Not on file  Transportation Needs: Not on file  Physical Activity: Not on file  Stress: Not on file  Social Connections: Not on file  Intimate Partner Violence: Not on file    FAMILY HISTORY:  Family History  Problem Relation Age of Onset   Heart attack Father    Heart attack Mother    Bladder Cancer Brother    Cervical cancer Daughter        ?   Colon cancer Neg Hx     CURRENT MEDICATIONS:  Outpatient Encounter Medications as of 11/08/2021  Medication Sig   albuterol (PROVENTIL HFA;VENTOLIN HFA) 108 (90 Base) MCG/ACT inhaler Take 2 puffs 3 times a day and every 4 hours as needed.   allopurinol (ZYLOPRIM) 300 MG tablet Take 300 mg by mouth daily.   aspirin EC 81 MG EC tablet Take 1 tablet (81 mg total) by mouth daily.   atorvastatin (LIPITOR) 80 MG tablet Take 80 mg by mouth daily.   calcium carbonate (TUMS - DOSED IN MG ELEMENTAL CALCIUM) 500 MG chewable tablet Chew 1 tablet by mouth as needed for indigestion or heartburn.   Carboxymethylcellulose Sodium (THERATEARS OP) Place 1 drop into both eyes 3 (three) times daily as needed (dry eyes).   carvedilol (COREG) 6.25 MG tablet Take 6.25 mg by mouth 2 (two) times daily with a meal.   cholecalciferol (VITAMIN D3) 25 MCG (1000 UT) tablet Take 1,000 Units by mouth daily.   ferrous sulfate 325 (65 FE) MG tablet Take 325 mg by mouth daily with breakfast.   fexofenadine (ALLEGRA) 180 MG tablet Take 180 mg by mouth daily.   fluticasone (FLONASE) 50 MCG/ACT nasal spray Place 2 sprays into both nostrils daily.   furosemide (LASIX) 40 MG tablet Take 40 mg by mouth 2 (two) times daily.    gabapentin (NEURONTIN) 400 MG capsule TAKE 1 CAPSULE(400 MG) BY MOUTH THREE TIMES DAILY   HYDROcodone-acetaminophen (NORCO) 10-325 MG tablet Take 1 tablet by mouth every 6 (six) hours as needed for moderate pain.   Lancets (ONETOUCH DELICA PLUS [id number]) MISC    leflunomide (ARAVA) 20 MG tablet  Take 20 mg by mouth daily.   losartan (COZAAR) 100 MG tablet Take 100 mg by mouth daily.   MAGNESIUM-OXIDE 400 (240 Mg) MG tablet  TAKE 1 TABLET(400 MG) BY MOUTH DAILY   meclizine (ANTIVERT) 12.5 MG tablet Take 12.5 mg by mouth 3 (three) times daily as needed for dizziness.   metFORMIN (GLUCOPHAGE-XR) 500 MG 24 hr tablet Take 1,000 mg by mouth 2 (two) times daily.   montelukast (SINGULAIR) 10 MG tablet Take 10 mg by mouth at bedtime.   Multiple Vitamins-Minerals (OCUVITE ADULT 50+ PO) Take 1 tablet by mouth daily.   nitroGLYCERIN (NITROSTAT) 0.4 MG SL tablet Place 1 tablet (0.4 mg total) under the tongue every 5 (five) minutes as needed for chest pain. Reported on 03/24/2016   omeprazole (PRILOSEC) 40 MG capsule    potassium chloride SA (K-DUR,KLOR-CON) 20 MEQ tablet Take 1 tablet (20 mEq total) by mouth 2 (two) times daily.   spironolactone (ALDACTONE) 25 MG tablet Take 0.5 tablets (12.5 mg total) by mouth daily.   triamcinolone cream (KENALOG) 0.1 % Apply 1 application topically daily as needed (for irritation).   No facility-administered encounter medications on file as of 11/08/2021.    ALLERGIES:  Allergies  Allergen Reactions   Biaxin [Clarithromycin] Rash and Other (See Comments)    Blisters in mouth    Penicillins Rash and Other (See Comments)    Blisters in mouth Has patient had a PCN reaction causing immediate rash, facial/tongue/throat swelling, SOB or lightheadedness with hypotension: Yesyes Has patient had a PCN reaction causing severe rash involving mucus membranes or skin necrosis: Nono Has patient had a PCN reaction that required hospitalization Nono Has patient had a PCN reaction occurring within the last 10 years: Nono If all of the above answers are "NO", then may proceed   Losartan Potassium     Other reaction(s): elevated creatinine   Metformin Hcl Er     Other reaction(s): diarrhea with 1000 mg     PHYSICAL EXAM:  ECOG PERFORMANCE STATUS: 2 - Symptomatic, <50%  confined to bed  Vitals:   11/08/21 0757  BP: 101/74  Pulse: 96  Resp: 17  Temp: (!) 96.6 F (35.9 C)  SpO2: 95%   Filed Weights   11/08/21 0757  Weight: 133 lb 4.8 oz (60.5 kg)   Physical Exam Constitutional:      Appearance: Normal appearance.  HENT:     Head: Normocephalic and atraumatic.     Mouth/Throat:     Mouth: Mucous membranes are moist.  Eyes:     Extraocular Movements: Extraocular movements intact.     Pupils: Pupils are equal, round, and reactive to light.  Cardiovascular:     Rate and Rhythm: Regular rhythm. Tachycardia present.     Pulses: Normal pulses.     Heart sounds: Normal heart sounds.  Pulmonary:     Effort: Pulmonary effort is normal.     Breath sounds: Normal breath sounds.  Abdominal:     General: Bowel sounds are normal.     Palpations: Abdomen is soft.     Tenderness: There is no abdominal tenderness.  Musculoskeletal:        General: No swelling.     Right hand: Deformity present.     Left hand: Deformity present.     Right lower leg: No edema.     Left lower leg: No edema.     Comments: Contractures and nodules of bilateral hands in the setting of rheumatoid arthritis  Lymphadenopathy:     Cervical: No cervical adenopathy.  Skin:    General: Skin is warm and dry.  Neurological:     General: No focal deficit present.  Mental Status: She is alert and oriented to person, place, and time.  Psychiatric:        Mood and Affect: Mood normal.        Behavior: Behavior normal.     LABORATORY DATA:  I have reviewed the labs as listed.  CBC    Component Value Date/Time   WBC 11.0 (H) 10/17/2021 1013   RBC 2.87 (L) 10/17/2021 1013   RBC 2.88 (L) 10/17/2021 1013   HGB 8.9 (L) 10/17/2021 1013   HGB 12.4 11/01/2015 1134   HCT 29.0 (L) 10/17/2021 1013   HCT 37.4 11/01/2015 1134   PLT 326 10/17/2021 1013   PLT 205 11/01/2015 1134   MCV 101.0 (H) 10/17/2021 1013   MCV 91.7 11/01/2015 1134   MCH 31.0 10/17/2021 1013   MCHC 30.7  10/17/2021 1013   RDW 18.1 (H) 10/17/2021 1013   RDW 15.0 (H) 11/01/2015 1134   LYMPHSABS 1.9 10/17/2021 1013   LYMPHSABS 2.9 11/01/2015 1134   MONOABS 0.7 10/17/2021 1013   MONOABS 0.7 11/01/2015 1134   EOSABS 0.2 10/17/2021 1013   EOSABS 0.4 11/01/2015 1134   BASOSABS 0.1 10/17/2021 1013   BASOSABS 0.0 11/01/2015 1134   CMP Latest Ref Rng & Units 10/17/2021 06/23/2021 05/31/2021  Glucose 70 - 99 mg/dL 123(H) 122(H) 141(H)  BUN 8 - 23 mg/dL _0 Creatinine 0.44 - 1.00 mg/dL 0.95 0.94 0.83  Sodium 135 - 145 mmol/L 135 136 136  Potassium 3.5 - 5.1 mmol/L 4.9 4.1 3.6  Chloride 98 - 111 mmol/L 100 100 96(L)  CO2 22 - 32 mmol/L _1 Calcium 8.9 - 10.3 mg/dL 9.3 8.7(L) 10.3  Total Protein 6.5 - 8.1 g/dL 6.7 7.4 -  Total Bilirubin 0.3 - 1.2 mg/dL 0.4 0.6 -  Alkaline Phos 38 - 126 U/L 120 84 -  AST 15 - 41 U/L 18 16 -  ALT 0 - 44 U/L 23 11 -    DIAGNOSTIC IMAGING:  I have independently reviewed the relevant imaging and discussed with the patient.  ASSESSMENT & PLAN: 1.  Normocytic anemia  - Previously seen by Dr. Earlie Server for anemia of chronic disease +/- iron deficiency.  She was discharged from hematology clinic in 2016 as her anemia had been stable on Nu-iron supplement. - Labs sent by PCP (09/16/2021) shows normocytic anemia with Hgb 9.6/MCV 92.2, iron saturation 11%, TIBC 373.  (Ferritin not checked.)  She had normal creatinine 0.94 when checked on 06/23/2021. - No history of blood transfusion.  Received IV Feraheme in 2015 and in August 2022. - Currently taking iron supplement Nu-iron once daily.  Also takes pantoprazole and aspirin 81 mg daily at home. - Reports episode of dark stool in the summer 2022, and followed with GI.  No current symptoms of bright red blood per rectum or melena.   - EGD (06/03/2021 by Dr. Gala Romney): Normal esophagus, stomach, duodenum - Colonoscopy (04/27/2020 by Dr. Gala Romney): Internal hemorrhoids and diverticulosis - Hemoccult stool x3 negative (December  2022) - Work-up of anemia (10/17/2021) showed Hgb 8.9/MCV 101.0.  Revealed iron saturation 15% but elevated ferritin 329 (high suspicion that elevated ferritin was acute phase reactant, as multiple inflammatory markers were also positive and patient has underlying RA).  Normal folate, copper, methylmalonic acid.  Elevated B12 2718 (likely acute phase reactant).  SPEP negative for M spike.  Immunofixation negative for monoclonal protein.  Mildly elevated kappa light chains 41.1/normal lambda 25.6, normal ratio 1.61. - She received IV Feraheme  x2 on 10/21/2021 and 10/28/2021, but without any significant improvement in symptoms.  She continues to have fatigue, palpitations, restless legs, dyspnea on exertion. - PLAN: Repeat CBC and iron panel in 8 weeks.  RTC after labs. - Patient instructed to continue oral iron supplementation, but to take it at least 4 hours after her PPI, along with a glass of orange juice to improve absorption. - If she has persistent anemia despite iron repletion, we will consider bone marrow biopsy.  2.  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 Earlie Server, who thought patient to have reactive leukocytosis. - BCR/ABL by PCR testing was negative in January 2014 - JAK2 with reflex was negative - Inflammatory markers positive (10/17/2021): CRP 2.4, ESR 76, elevated B12 and ferritin - She had COVID-19 infection in November 2022. - She uses steroid inhaler (Trelegy) and intermittent Kenalog cream. - She is a current non-smoker. - She has underlying rheumatoid arthritis. - She denies any B symptoms such as fever, chills, night sweats, unintentional weight loss.  No new lumps or bumps.  - Most recent labs (10/17/2021): WBC 11.0/ANC 8.1.  LDH normal. - Differential diagnosis favors reactive leukocytosis in the setting of rheumatoid arthritis and chronic disease - PLAN:  No further work-up or intervention at this time.  We will repeat CBC at follow-up visit in 2 months.   PLAN SUMMARY & DISPOSITION: Labs in 8 weeks (CBC, iron panel) Office visit after labs  All questions were answered. The patient knows to call the clinic with any problems, questions or concerns.  Medical decision making: Moderate  Time spent on visit: I spent 20 minutes counseling the patient face to face. The total time spent in the appointment was 30 minutes and more than 50% was on counseling.   Harriett Rush, PA-C  11/08/2021 8:31 AM

## 2021-11-08 ENCOUNTER — Inpatient Hospital Stay (HOSPITAL_BASED_OUTPATIENT_CLINIC_OR_DEPARTMENT_OTHER): Payer: Medicare Other | Admitting: Physician Assistant

## 2021-11-08 ENCOUNTER — Other Ambulatory Visit: Payer: Self-pay

## 2021-11-08 ENCOUNTER — Encounter (HOSPITAL_COMMUNITY): Payer: Self-pay | Admitting: Physician Assistant

## 2021-11-08 VITALS — BP 101/74 | HR 96 | Temp 96.6°F | Resp 17 | Ht 62.0 in | Wt 133.3 lb

## 2021-11-08 DIAGNOSIS — D72829 Elevated white blood cell count, unspecified: Secondary | ICD-10-CM | POA: Diagnosis not present

## 2021-11-08 DIAGNOSIS — D509 Iron deficiency anemia, unspecified: Secondary | ICD-10-CM | POA: Diagnosis not present

## 2021-11-08 DIAGNOSIS — D649 Anemia, unspecified: Secondary | ICD-10-CM | POA: Diagnosis not present

## 2021-11-08 NOTE — Patient Instructions (Signed)
[place] at [facility] Discharge Instructions  You were seen today by Tarri Abernethy PA-C for your iron deficiency anemia and elevated white blood cell counts.  We will check your iron and blood levels again in 8 weeks, to see if the IV iron you received has helped your levels.  Your white blood cell counts are elevated as reaction to your chronic disease and inflammation.  LABS: Return in 8 weeks for repeat labs  OTHER TESTS: No other tests at this time  MEDICATIONS: No changes to home medications  FOLLOW-UP APPOINTMENT: Office visit in 8 weeks, after labs   Thank you for choosing [place] at [facility] to provide your oncology and hematology care.  To afford each patient quality time with our provider, please arrive at least 15 minutes before your scheduled appointment time.   If you have a lab appointment with the [facility] please come in thru the Main Entrance and check in at the main information desk.  You need to re-schedule your appointment should you arrive 10 or more minutes late.  We strive to give you quality time with our providers, and arriving late affects you and other patients whose appointments are after yours.  Also, if you no show three or more times for appointments you may be dismissed from the clinic at the providers discretion.     Again, thank you for choosing [facility].  Our hope is that these requests will decrease the amount of time that you wait before being seen by our physicians.       _____________________________________________________________  Should you have questions after your visit to [facility], please contact our office at [phone] and follow the prompts.  Our office hours are 8:00 a.m. and 4:30 p.m. Monday - Friday.  Please note that voicemails left after 4:00 p.m. may not be returned until the following business day.  We are closed weekends and major  holidays.  You do have access to a nurse 24-7, just call the main number to the clinic [phone] and do not press any options, hold on the line and a nurse will answer the phone.    For prescription refill requests, have your pharmacy contact our office and allow 72 hours.    Due to Covid, you will need to wear a mask upon entering the hospital. If you do not have a mask, a mask will be given to you at the Main Entrance upon arrival. For doctor visits, patients may have 1 support person age 29 or older with them. For treatment visits, patients can not have anyone with them due to social distancing guidelines and our immunocompromised population.

## 2021-11-16 NOTE — Progress Notes (Signed)
Remote ICD transmission.   

## 2021-11-24 DIAGNOSIS — M4722 Other spondylosis with radiculopathy, cervical region: Secondary | ICD-10-CM | POA: Diagnosis not present

## 2021-11-24 DIAGNOSIS — M961 Postlaminectomy syndrome, not elsewhere classified: Secondary | ICD-10-CM | POA: Diagnosis not present

## 2021-11-24 DIAGNOSIS — Z9689 Presence of other specified functional implants: Secondary | ICD-10-CM | POA: Diagnosis not present

## 2021-11-28 ENCOUNTER — Other Ambulatory Visit: Payer: Self-pay | Admitting: Neurosurgery

## 2021-11-28 DIAGNOSIS — M4722 Other spondylosis with radiculopathy, cervical region: Secondary | ICD-10-CM

## 2021-11-28 DIAGNOSIS — M1711 Unilateral primary osteoarthritis, right knee: Secondary | ICD-10-CM | POA: Diagnosis not present

## 2021-11-28 DIAGNOSIS — M1712 Unilateral primary osteoarthritis, left knee: Secondary | ICD-10-CM | POA: Diagnosis not present

## 2021-11-30 DIAGNOSIS — D0461 Carcinoma in situ of skin of right upper limb, including shoulder: Secondary | ICD-10-CM | POA: Diagnosis not present

## 2021-12-01 ENCOUNTER — Other Ambulatory Visit: Payer: Self-pay

## 2021-12-01 ENCOUNTER — Ambulatory Visit
Admission: RE | Admit: 2021-12-01 | Discharge: 2021-12-01 | Disposition: A | Discharge status: Home / Self Care | Payer: Medicare Other | Source: Ambulatory Visit | Attending: Neurosurgery | Admitting: Neurosurgery

## 2021-12-01 DIAGNOSIS — M4802 Spinal stenosis, cervical region: Secondary | ICD-10-CM | POA: Diagnosis not present

## 2021-12-01 DIAGNOSIS — M4722 Other spondylosis with radiculopathy, cervical region: Secondary | ICD-10-CM | POA: Diagnosis not present

## 2021-12-01 DIAGNOSIS — M5031 Other cervical disc degeneration,  high cervical region: Secondary | ICD-10-CM | POA: Diagnosis not present

## 2021-12-01 DIAGNOSIS — M50321 Other cervical disc degeneration at C4-C5 level: Secondary | ICD-10-CM | POA: Diagnosis not present

## 2021-12-01 MED ORDER — DIAZEPAM 5 MG PO TABS
5.0000 mg | ORAL_TABLET | Freq: Once | ORAL | Status: AC
  Administered 2021-12-01: 5 mg via ORAL

## 2021-12-01 MED ORDER — ONDANSETRON HCL 4 MG/2ML IJ SOLN: 4.0000 mg | Freq: Once | INTRAMUSCULAR | Status: DC | PRN

## 2021-12-01 MED ORDER — IOPAMIDOL (ISOVUE-M 300) INJECTION 61%
10.0000 mL | Freq: Once | INTRAMUSCULAR | Status: AC
  Administered 2021-12-01: 10 mL via INTRATHECAL

## 2021-12-01 MED ORDER — MEPERIDINE HCL 50 MG/ML IJ SOLN: 50.0000 mg | Freq: Once | INTRAMUSCULAR | Status: DC | PRN

## 2021-12-01 NOTE — Discharge Instructions (Signed)

## 2021-12-06 ENCOUNTER — Ambulatory Visit
Admission: RE | Admit: 2021-12-06 | Discharge: 2021-12-06 | Disposition: A | Discharge status: Home / Self Care | Payer: Medicare Other | Source: Ambulatory Visit | Attending: Family Medicine | Admitting: Family Medicine

## 2021-12-06 ENCOUNTER — Other Ambulatory Visit: Payer: Self-pay

## 2021-12-06 DIAGNOSIS — Z1231 Encounter for screening mammogram for malignant neoplasm of breast: Secondary | ICD-10-CM | POA: Diagnosis not present

## 2021-12-07 ENCOUNTER — Other Ambulatory Visit: Payer: Self-pay | Admitting: Family Medicine

## 2021-12-07 DIAGNOSIS — R928 Other abnormal and inconclusive findings on diagnostic imaging of breast: Secondary | ICD-10-CM

## 2021-12-09 ENCOUNTER — Other Ambulatory Visit: Payer: Self-pay | Admitting: Cardiovascular Disease

## 2021-12-16 DIAGNOSIS — M4722 Other spondylosis with radiculopathy, cervical region: Secondary | ICD-10-CM | POA: Diagnosis not present

## 2021-12-19 ENCOUNTER — Other Ambulatory Visit: Payer: Self-pay

## 2021-12-19 ENCOUNTER — Encounter (HOSPITAL_COMMUNITY): Payer: Self-pay | Admitting: Cardiology

## 2021-12-19 ENCOUNTER — Encounter (HOSPITAL_COMMUNITY): Payer: Self-pay | Admitting: Hematology

## 2021-12-19 ENCOUNTER — Other Ambulatory Visit (HOSPITAL_COMMUNITY): Payer: Self-pay

## 2021-12-19 ENCOUNTER — Ambulatory Visit (HOSPITAL_COMMUNITY)
Admission: RE | Admit: 2021-12-19 | Discharge: 2021-12-19 | Disposition: A | Discharge status: Home / Self Care | Payer: Medicare Other | Source: Ambulatory Visit | Attending: Cardiology | Admitting: Cardiology

## 2021-12-19 VITALS — BP 124/70 | HR 75 | Wt 132.0 lb

## 2021-12-19 DIAGNOSIS — Z87891 Personal history of nicotine dependence: Secondary | ICD-10-CM | POA: Diagnosis not present

## 2021-12-19 DIAGNOSIS — M545 Low back pain, unspecified: Secondary | ICD-10-CM | POA: Diagnosis not present

## 2021-12-19 DIAGNOSIS — I2510 Atherosclerotic heart disease of native coronary artery without angina pectoris: Secondary | ICD-10-CM | POA: Insufficient documentation

## 2021-12-19 DIAGNOSIS — Z951 Presence of aortocoronary bypass graft: Secondary | ICD-10-CM | POA: Diagnosis not present

## 2021-12-19 DIAGNOSIS — Z7982 Long term (current) use of aspirin: Secondary | ICD-10-CM | POA: Diagnosis not present

## 2021-12-19 DIAGNOSIS — I5042 Chronic combined systolic (congestive) and diastolic (congestive) heart failure: Secondary | ICD-10-CM | POA: Diagnosis not present

## 2021-12-19 DIAGNOSIS — I2582 Chronic total occlusion of coronary artery: Secondary | ICD-10-CM | POA: Diagnosis not present

## 2021-12-19 DIAGNOSIS — Z79899 Other long term (current) drug therapy: Secondary | ICD-10-CM | POA: Insufficient documentation

## 2021-12-19 DIAGNOSIS — Z7901 Long term (current) use of anticoagulants: Secondary | ICD-10-CM | POA: Diagnosis not present

## 2021-12-19 DIAGNOSIS — I447 Left bundle-branch block, unspecified: Secondary | ICD-10-CM | POA: Diagnosis not present

## 2021-12-19 DIAGNOSIS — Z7984 Long term (current) use of oral hypoglycemic drugs: Secondary | ICD-10-CM | POA: Insufficient documentation

## 2021-12-19 DIAGNOSIS — I5022 Chronic systolic (congestive) heart failure: Secondary | ICD-10-CM | POA: Diagnosis not present

## 2021-12-19 DIAGNOSIS — M069 Rheumatoid arthritis, unspecified: Secondary | ICD-10-CM | POA: Diagnosis not present

## 2021-12-19 DIAGNOSIS — I255 Ischemic cardiomyopathy: Secondary | ICD-10-CM | POA: Diagnosis not present

## 2021-12-19 DIAGNOSIS — I25810 Atherosclerosis of coronary artery bypass graft(s) without angina pectoris: Secondary | ICD-10-CM

## 2021-12-19 DIAGNOSIS — I110 Hypertensive heart disease with heart failure: Secondary | ICD-10-CM | POA: Insufficient documentation

## 2021-12-19 LAB — BASIC METABOLIC PANEL
Anion gap: 11 (ref 5–15)
BUN: 33 mg/dL — ABNORMAL HIGH (ref 8–23)
CO2: 26 mmol/L (ref 22–32)
Calcium: 9.3 mg/dL (ref 8.9–10.3)
Chloride: 98 mmol/L (ref 98–111)
Creatinine, Ser: 1.01 mg/dL — ABNORMAL HIGH (ref 0.44–1.00)
GFR, Estimated: 58 mL/min — ABNORMAL LOW (ref >60)
Glucose, Bld: 147 mg/dL — ABNORMAL HIGH (ref 70–99)
Potassium: 4.5 mmol/L (ref 3.5–5.1)
Sodium: 135 mmol/L (ref 135–145)

## 2021-12-19 LAB — BRAIN NATRIURETIC PEPTIDE: B Natriuretic Peptide: 498.3 pg/mL — ABNORMAL HIGH (ref 0.0–100.0)

## 2021-12-19 MED ORDER — POTASSIUM CHLORIDE CRYS ER 20 MEQ PO TBCR: 20.0000 meq | EXTENDED_RELEASE_TABLET | Freq: Every day | ORAL | 11 refills | Status: DC

## 2021-12-19 MED ORDER — DAPAGLIFLOZIN PROPANEDIOL 10 MG PO TABS: 10.0000 mg | ORAL_TABLET | Freq: Every day | ORAL | 11 refills | Status: DC

## 2021-12-19 MED ORDER — SPIRONOLACTONE 25 MG PO TABS: 25.0000 mg | ORAL_TABLET | Freq: Every day | ORAL | 3 refills | Status: DC

## 2021-12-19 NOTE — Patient Instructions (Addendum)
EKG done today.  Labs done today. We will contact you only if your labs are abnormal.  START Farixga 10mg  (1 tablet) by mouth daily.  INCREASE Spironolactone to 25mg  (1 tablet) by mouth daily.   DECREASE Potassium to 24meq(1 tablet) by mouth daily.   No other medication changes were made. Please continue all current medications as prescribed.  Your physician recommends that you schedule a follow-up appointment in: 10 days for a lab only appointment, 3 weeks with our Clinic Pharmacist and in 2 months with Dr. Aundra Dubin  If you have any questions or concerns before your next appointment please send [place] a message through [facility] or call our office at [phone].    TO LEAVE A MESSAGE FOR THE NURSE SELECT OPTION 2, PLEASE LEAVE A MESSAGE INCLUDING: YOUR NAME DATE OF BIRTH CALL BACK NUMBER REASON FOR CALL**this is important as we prioritize the call backs  YOU WILL RECEIVE A CALL BACK THE SAME DAY AS LONG AS YOU CALL BEFORE 4:00 PM   Do the following things EVERYDAY: Weigh yourself in the morning before breakfast. Write it down and keep it in a log. Take your medicines as prescribed Eat low salt foods--Limit salt (sodium) to 2000 mg per day.  Stay as active as you can everyday Limit all fluids for the day to less than 2 liters   At the [facility], you and your health needs are our priority. As part of our continuing mission to provide you with exceptional heart care, we have created designated Provider Care Teams. These Care Teams include your primary Cardiologist (physician) and Advanced Practice Providers (APPs- Physician Assistants and Nurse Practitioners) who all work together to provide you with the care you need, when you need it.   You may see any of the following providers on your designated Care Team at your next follow up: Dr Glori Bickers Dr Haynes Kerns, NP Lyda Jester, [place] Audry Riles, PharmD   Please be sure to bring in all your  medications bottles to every appointment.

## 2021-12-20 NOTE — Progress Notes (Signed)
PCP: Harlan Stains, MD Cardiology: Dr. [name] Cancel HF Cardiology: Dr. Aundra Dubin  76 y.o. with history of CAD s/p CABG and ischemic cardiomyopathy was referred by Dr. [name] Cancel for evaluation of CHF.  Patient has a long h/o heart disease.  In 1996, she had CABG with sequential LIMA-LAD and diagonal.  Last cath in 5/21 showed totally occluded LAD with patent LIMA-LAD and diagonal, minimal disease in LCx and RCA. LV systolic function has been reduced.  She has a [employer], [employer] ICD.  Last echo in 9/22 showed EF 25-30%, low normal RV function, moderate MR.  She has a chronic LBBB.  She additionally has rheumatoid arthritis and significant low back pain for which she has a spinal stimulator.   Her main reported limitation is low back pain.  This significantly limits her activity.  She does not report exertional dyspnea when walking short distances, and unable to walk long distances due to back pain.  No chest pain.  No orthopnea/PND.  No lightheadedness.  She tried to take [facility] in the past and was very tired/fatigued on it.  She was unable to tolerate it even at low dose and stopped it.  She is now on losartan.   ECG (personally reviewed): NSR, LBBB 144 msec  Labs (11/22): K 4.9, creatinine 0.95  PMH: 1. HTN 2. Hyperlipidemia 3. GERD 4. Gout 5. Fe deficiency anemia 6. CAD: CABG in 1996 with sequential LIMA-diagonal and LAD.  - LHC (5/21): totally occluded LAD with patent sequent LIMA-D and LAD, no significant LCx or RCA disease.  7. Chronic systolic CHF: Ischemic cardiomyopathy.  [employer] ICD.  - Echo (9/22): EF 25-30%, low normal RV function, moderate MR.  8. Low back pain: Arthritis, has spinal stimulator.  9. Chronic LBBB 10. Rheumatoid arthritis.   Social History   Socioeconomic History   Marital status: Married    Spouse name: Not on file   Number of children: 3   Years of education: Not on file   Highest education level: Not on file  Occupational History   Not on file  Tobacco Use   Smoking  status: Former    Packs/day: 0.75    Years: 15.00    Pack years: 11.25    Types: Cigarettes    Quit date: 11/20/1994    Years since quitting: 27.1   Smokeless tobacco: Never   Tobacco comments:    Quit in 1996  Vaping Use   Vaping Use: Never used  Substance and Sexual Activity   Alcohol use: Never    Alcohol/week: 0.0 standard drinks   Drug use: Never   Sexual activity: Yes  Other Topics Concern   Not on file  Social History Narrative   2 living children, one deceased age 32   Right handed   One story home   Drinks coffee am   Social Determinants of Health   Financial Resource Strain: Not on file  Food Insecurity: Not on file  Transportation Needs: Not on file  Physical Activity: Not on file  Stress: Not on file  Social Connections: Not on file  Intimate Partner Violence: Not on file   Family History  Problem Relation Age of Onset   Heart attack Father    Heart attack Mother    Bladder Cancer Brother    Cervical cancer Daughter        ?   Colon cancer Neg Hx    ROS: All systems reviewed and negative except as per HPI.   Current Outpatient Medications  Medication  Sig Dispense Refill   albuterol (PROVENTIL HFA;VENTOLIN HFA) 108 (90 Base) MCG/ACT inhaler Take 2 puffs 3 times a day and every 4 hours as needed.     allopurinol (ZYLOPRIM) 300 MG tablet Take 300 mg by mouth daily.     aspirin EC 81 MG EC tablet Take 1 tablet (81 mg total) by mouth daily.     atorvastatin (LIPITOR) 80 MG tablet Take 80 mg by mouth daily.     calcium carbonate (TUMS - DOSED IN MG ELEMENTAL CALCIUM) 500 MG chewable tablet Chew 1 tablet by mouth as needed for indigestion or heartburn.     Carboxymethylcellulose Sodium (THERATEARS OP) Place 1 drop into both eyes 3 (three) times daily as needed (dry eyes).     carvedilol (COREG) 6.25 MG tablet Take 6.25 mg by mouth 2 (two) times daily with a meal.     cholecalciferol (VITAMIN D3) 25 MCG (1000 UT) tablet Take 1,000 Units by mouth daily.      dapagliflozin propanediol (FARXIGA) 10 MG TABS tablet Take 1 tablet (10 mg total) by mouth daily before breakfast. 30 tablet 11   ferrous sulfate 325 (65 FE) MG tablet Take 325 mg by mouth daily with breakfast.     fexofenadine (ALLEGRA) 180 MG tablet Take 180 mg by mouth daily.     fluticasone (FLONASE) 50 MCG/ACT nasal spray Place 2 sprays into both nostrils daily.     furosemide (LASIX) 40 MG tablet Take 40 mg by mouth 2 (two) times daily.      gabapentin (NEURONTIN) 400 MG capsule TAKE 1 CAPSULE(400 MG) BY MOUTH THREE TIMES DAILY 90 capsule 5   HYDROcodone-acetaminophen (NORCO) 10-325 MG tablet Take 1 tablet by mouth every 6 (six) hours as needed for moderate pain.     Lancets (ONETOUCH DELICA PLUS [id number]) MISC      leflunomide (ARAVA) 20 MG tablet Take 20 mg by mouth daily.     losartan (COZAAR) 100 MG tablet Take 100 mg by mouth daily.     MAGNESIUM-OXIDE 400 (240 Mg) MG tablet TAKE 1 TABLET(400 MG) BY MOUTH DAILY 30 tablet 2   meclizine (ANTIVERT) 12.5 MG tablet Take 12.5 mg by mouth 3 (three) times daily as needed for dizziness.     metFORMIN (GLUCOPHAGE-XR) 500 MG 24 hr tablet Take 1,000 mg by mouth 2 (two) times daily.     montelukast (SINGULAIR) 10 MG tablet Take 10 mg by mouth at bedtime.     Multiple Vitamins-Minerals (OCUVITE ADULT 50+ PO) Take 1 tablet by mouth daily.     nitroGLYCERIN (NITROSTAT) 0.4 MG SL tablet Place 1 tablet (0.4 mg total) under the tongue every 5 (five) minutes as needed for chest pain. Reported on 03/24/2016 25 tablet 3   omeprazole (PRILOSEC) 20 MG capsule Take 20 mg by mouth daily.     triamcinolone cream (KENALOG) 0.1 % Apply 1 application topically daily as needed (for irritation).     potassium chloride SA (KLOR-CON M) 20 MEQ tablet Take 1 tablet (20 mEq total) by mouth daily. 30 tablet 11   spironolactone (ALDACTONE) 25 MG tablet Take 1 tablet (25 mg total) by mouth daily. 90 tablet 3   No current facility-administered medications for this encounter.    BP 124/70    Pulse 75    Wt 59.9 kg (132 lb)    SpO2 94%    BMI 24.14 kg/m  General: NAD Neck: No JVD, no thyromegaly or thyroid nodule.  Lungs: Clear to auscultation bilaterally with normal respiratory  effort. CV: Nondisplaced PMI.  Heart regular S1/S2, no S3/S4, no murmur.  No peripheral edema.  No carotid bruit.  Normal pedal pulses.  Abdomen: Soft, nontender, no hepatosplenomegaly, no distention.  Skin: Intact without lesions or rashes.  Neurologic: Alert and oriented x 3.  Psych: Normal affect. Extremities: No clubbing or cyanosis.  HEENT: Normal.   Assessment/Plan: 1. Chronic systolic CHF: Probably ischemic cardiomyopathy (h/o occluded LAD, s/p CABG). Coronaries have been unchanged since CABG in 1996. However, EF has declined over the years and I worry that another process may be present.  She has a chronic LBBB, so LBBB cardiomyopathy may play a role. Unable to obtain cardiac MRI with spinal cord stimulator. NYHA class II symptoms likely (though difficult to tell with limitation from back pain).  She is not volume overloaded on exam.  - We discussed Entresto, she does not feel like she can re-try this.  Therefore, will continue losartan 100 mg daily.  - Increase spironolactone to 25 mg daily and decrease KCl to 20 mEq daily.  - Start Farxiga 10 mg daily.  - Continue Coreg 6.25 mg bid.  - Continue losartan 100 mg daily.  - She is on Lasix 40 mg bid, will continue this for now. May be able to cut back with starting Farxiga.  - BMET/BNP today, BMET 10 days.  - I would like her to be re-evaluated by EP for possible CRT upgrade.  She has a LBBB of borderline width (144 msec on ECG today).  2. CAD: CABG 1996.  Cath in 5/21 due to fall in EF showed totally occluded LAD, patent RCA and LCx, patent LIMA-LAD and diagonal.  No change, no interventional target.  No chest pain.  - Continue ASA 81 daily.  - Continue atorvastatin 80 daily.   Loralie Champagne 12/20/2021

## 2021-12-24 ENCOUNTER — Other Ambulatory Visit: Payer: Self-pay

## 2021-12-24 ENCOUNTER — Other Ambulatory Visit: Payer: Self-pay | Admitting: Family Medicine

## 2021-12-24 ENCOUNTER — Ambulatory Visit
Admission: RE | Admit: 2021-12-24 | Discharge: 2021-12-24 | Disposition: A | Discharge status: Home / Self Care | Payer: Medicare Other | Source: Ambulatory Visit | Attending: Family Medicine | Admitting: Family Medicine

## 2021-12-24 DIAGNOSIS — R922 Inconclusive mammogram: Secondary | ICD-10-CM | POA: Diagnosis not present

## 2021-12-24 DIAGNOSIS — R928 Other abnormal and inconclusive findings on diagnostic imaging of breast: Secondary | ICD-10-CM

## 2021-12-24 DIAGNOSIS — R921 Mammographic calcification found on diagnostic imaging of breast: Secondary | ICD-10-CM

## 2021-12-26 NOTE — Progress Notes (Signed)
Advanced Heart Failure Clinic Note   PCP: Harlan Stains, MD Cardiology: Dr. [name] Cancel HF Cardiology: Dr. Aundra Dubin  HPI:  76 y.o. with history of CAD s/p CABG and ischemic cardiomyopathy was referred by Dr. [name] Cancel for evaluation of CHF.  Patient has a long h/o heart disease.  In 1996, she had CABG with sequential LIMA-LAD and diagonal.  Last cath in 03/2020 showed totally occluded LAD with patent LIMA-LAD and diagonal, minimal disease in LCx and RCA. LV systolic function has been reduced.  She has a [employer] officer, [employer] ICD.  Echo in 07/2021 showed EF 25-30%, low normal RV function, moderate MR.  She has a chronic LBBB.  She additionally has rheumatoid arthritis and significant low back pain for which she has a spinal stimulator.    Presented to AHF Clinic with Dr. Aundra Dubin on 12/19/21. Her main reported limitation was low back pain which significantly limits her activity.  Denied exertional dyspnea when walking short distances, and unable to walk long distances due to back pain.  No chest pain.  No orthopnea/PND.  No lightheadedness.  She tried to take [facility] in the past and was very tired/fatigued on it.  She was unable to tolerate it even at low dose and stopped it.  She is now on losartan.   Today she returns to HF clinic for pharmacist medication titration. At last visit with MD Wilder Glade 10 mg daily was initiated. Additionally, spironolactone was increased to 25 mg daily and KCL was decreased to 20 mEq daily. Overall she is feeling well today. Notes she will feel tired if she does a lot of activities the day before. She has very frequent dizziness that does not appear to be related to her BP (BP remains ~120/60s during these spells). Per patient report, she has follow up with a neurosurgeon for this and some "neck issues". No SOB/DOE. Weight at home has been stable at 128-132 lbs. She takes Lasix 40 mg BID and will take an extra tablet when her weight increases. No LEE, PND or orthopnea.    HF  Medications: Carvedilol 6.25 mg BID Losartan 100 mg daily Spironolactone 25 mg daily Farxiga 10 mg daily Lasix 40 mg BID Potassium chloride 20 mEq daily  Has the patient been experiencing any side effects to the medications prescribed?  no  Does the patient have any problems obtaining medications due to transportation or finances?   No; [employer] [facility] Medicare insurance  Understanding of regimen: good Understanding of indications: good Potential of compliance: good Patient understands to avoid NSAIDs. Patient understands to avoid decongestants.    Pertinent Lab Values: 12/19/21: Serum creatinine 1.01, BUN 33, Potassium 4.5, Sodium 135 BMET today pending  Vital Signs: Weight: 132.2 lbs (last clinic weight: 130.7 lbs) Blood pressure: 122/66  Heart rate: 90   Assessment/Plan: 1. Chronic systolic CHF: Probably ischemic cardiomyopathy (h/o occluded LAD, s/p CABG). Coronaries have been unchanged since CABG in 1996. However, EF has declined over the years and it is possible another process may be present.  She has a chronic LBBB, so LBBB cardiomyopathy may play a role. Unable to obtain cardiac MRI with spinal cord stimulator.  - NYHA class II symptoms likely (though difficult to tell with limitation from back pain).  She is not volume overloaded on exam.  - BMET today pending - Continue Lasix 40 mg BID.  - Increase carvedilol to 12.5 mg BID.  - Continue losartan 100 mg daily. Previously discussed Entresto, she does not feel like she can re-try this.   -  Continue spironolactone 25 mg daily  - Continue Farxiga 10 mg daily.  -Dr. Aundra Dubin previously referred her to be re-evaluated by EP for possible CRT upgrade.  She has a LBBB of borderline width (144 msec on ECG 12/19/21).  2. CAD: CABG 1996.  Cath in 03/2020 due to fall in EF showed totally occluded LAD, patent RCA and LCx, patent LIMA-LAD and diagonal.  No change, no interventional target.  No chest pain.  - Continue ASA 81 daily.  - Continue  atorvastatin 80 daily.   Follow up 6 weeks with Dr. Rush Farmer, PharmD, BCPS, [facility], [facility] Pharmacist [phone]

## 2021-12-27 ENCOUNTER — Inpatient Hospital Stay (HOSPITAL_COMMUNITY): Payer: Medicare Other | Attending: Hematology

## 2021-12-27 ENCOUNTER — Other Ambulatory Visit: Payer: Self-pay

## 2021-12-27 DIAGNOSIS — Z7982 Long term (current) use of aspirin: Secondary | ICD-10-CM | POA: Diagnosis not present

## 2021-12-27 DIAGNOSIS — D649 Anemia, unspecified: Secondary | ICD-10-CM

## 2021-12-27 DIAGNOSIS — D509 Iron deficiency anemia, unspecified: Secondary | ICD-10-CM | POA: Insufficient documentation

## 2021-12-27 DIAGNOSIS — Z79899 Other long term (current) drug therapy: Secondary | ICD-10-CM | POA: Insufficient documentation

## 2021-12-27 DIAGNOSIS — Z7984 Long term (current) use of oral hypoglycemic drugs: Secondary | ICD-10-CM | POA: Diagnosis not present

## 2021-12-27 DIAGNOSIS — M542 Cervicalgia: Secondary | ICD-10-CM | POA: Diagnosis not present

## 2021-12-27 DIAGNOSIS — M069 Rheumatoid arthritis, unspecified: Secondary | ICD-10-CM | POA: Insufficient documentation

## 2021-12-27 DIAGNOSIS — D72829 Elevated white blood cell count, unspecified: Secondary | ICD-10-CM | POA: Insufficient documentation

## 2021-12-27 LAB — CBC WITH DIFFERENTIAL/PLATELET
Abs Immature Granulocytes: 0.11 10*3/uL — ABNORMAL HIGH (ref 0.00–0.07)
Basophils Absolute: 0.1 10*3/uL (ref 0.0–0.1)
Basophils Relative: 1 %
Eosinophils Absolute: 0.2 10*3/uL (ref 0.0–0.5)
Eosinophils Relative: 2 %
HCT: 36 % (ref 36.0–46.0)
Hemoglobin: 10.9 g/dL — ABNORMAL LOW (ref 12.0–15.0)
Immature Granulocytes: 1 %
Lymphocytes Relative: 24 %
Lymphs Abs: 2.7 10*3/uL (ref 0.7–4.0)
MCH: 29.5 pg (ref 26.0–34.0)
MCHC: 30.3 g/dL (ref 30.0–36.0)
MCV: 97.6 fL (ref 80.0–100.0)
Monocytes Absolute: 0.8 10*3/uL (ref 0.1–1.0)
Monocytes Relative: 7 %
Neutro Abs: 7.2 10*3/uL (ref 1.7–7.7)
Neutrophils Relative %: 65 %
Platelets: 186 10*3/uL (ref 150–400)
RBC: 3.69 MIL/uL — ABNORMAL LOW (ref 3.87–5.11)
RDW: 15.5 % (ref 11.5–15.5)
WBC: 11.1 10*3/uL — ABNORMAL HIGH (ref 4.0–10.5)
nRBC: 0 % (ref 0.0–0.2)

## 2021-12-27 LAB — IRON AND TIBC
Iron: 103 ug/dL (ref 28–170)
Saturation Ratios: 25 % (ref 10.4–31.8)
TIBC: 420 ug/dL (ref 250–450)
UIBC: 317 ug/dL

## 2021-12-27 LAB — FERRITIN: Ferritin: 961 ng/mL — ABNORMAL HIGH (ref 11–307)

## 2021-12-30 ENCOUNTER — Other Ambulatory Visit: Payer: Self-pay | Admitting: Internal Medicine

## 2021-12-30 ENCOUNTER — Other Ambulatory Visit: Payer: Self-pay | Admitting: Gastroenterology

## 2022-01-02 NOTE — Progress Notes (Signed)
[facility] [address], [place] [address]   CLINIC:  Medical Oncology/Hematology  PCP:  Laurann Montana, MD [address] [place] [address] [phone]   REASON FOR VISIT:  Follow-up for iron deficiency anemia and leukocytosis   CURRENT THERAPY: Intermittent IV iron infusions (Feraheme on 10/21/2021 and 10/28/2021)  INTERVAL HISTORY:  [name] 76 y.o. female returns for routine follow-up of her iron deficiency anemia and leukocytosis.  She was last seen by Rojelio Brenner PA-C on 11/08/2021.  At today's visit, she reports feeling fair.  No recent hospitalizations, surgeries, or changes in baseline health status.  She has not noticed any recent bleeding such as epistaxis, hematemesis, hematochezia, or melena.  She continues to have chronic fatigue that waxes and wanes from day today.  She does note some improvement in her energy levels after her IV iron infusions in December 2022.  She admits to chronic headaches.  She denies any pica, restless legs, palpitations, chest pain, dyspnea on exertion, lightheadedness, or syncope.  She denies any B symptoms such as fever, chills, night sweats, unintentional weight loss.  No new lumps or bumps.   She has 40% energy and 80% appetite. She endorses that she is maintaining a stable weight.   REVIEW OF SYSTEMS:  Review of Systems  Constitutional:  Positive for fatigue. Negative for appetite change, chills, diaphoresis, fever and unexpected weight change.  HENT:   Negative for lump/mass and nosebleeds.   Eyes:  Negative for eye problems.  Respiratory:  Negative for cough, hemoptysis and shortness of breath.   Cardiovascular:  Negative for chest pain, leg swelling and palpitations.  Gastrointestinal:  Negative for abdominal pain, blood in stool, constipation, diarrhea, nausea and vomiting.  Genitourinary:  Negative for hematuria.   Musculoskeletal:  Positive for neck pain.  Skin: Negative.    Neurological:  Positive for dizziness and headaches. Negative for light-headedness.  Hematological:  Does not bruise/bleed easily.  Psychiatric/Behavioral:  Positive for sleep disturbance.      PAST MEDICAL/SURGICAL HISTORY:  Past Medical History:  Diagnosis Date   Anemia    anemia of chronic disease +/- IDA followed by Dr. Shirline Frees. received iron infusions in the past   Asthma    Borderline glaucoma    CAD (coronary artery disease)    a. s/p CABG 1996. b. low risk nuc 2011. c. LHC 04/2016 due to drop in EF -> occluded native LAD,  widely patent sequential LIMA-D1-LAD.   Chronic kidney disease    "mild stage of kidney disease"   Chronic systolic CHF (congestive heart failure) (HCC)    Complication of anesthesia    DM2 (diabetes mellitus, type 2) (HCC)    not on any medicine for this at this time, 05/2016   Dyspnea    with exertion   GERD (gastroesophageal reflux disease)    Gout    History of blood transfusion    History of hiatal hernia    in 20s   History of pneumonia    March, 2016   HLD (hyperlipidemia)    HTN (hypertension)    Hyperthyroidism 01/21/2016   Inappropriate sinus tachycardia    LBBB (left bundle branch block)    a. Seen in 04/2016   Lymphocytic colitis 04/2020   Macular degeneration    left   Myocardial infarction [facility])    1996   Neuropathy    Neuropathy    NICM (nonischemic cardiomyopathy) (HCC)    a. Dx 04/2016 - EF 25-30%,  diffuse HK, elevated LVEDP, mild MR, mod LAE.   Normocytic anemia 10/17/2021   NSVT (nonsustained ventricular tachycardia)    PAT (paroxysmal atrial tachycardia) (HCC)    Pneumonia    PONV (postoperative nausea and vomiting)    "after just about every surgery I've had"   Rheumatoid arthritis ([place])    RA- hands   Seasonal allergies    Past Surgical History:  Procedure Laterality Date   ABDOMINAL HYSTERECTOMY     APPENDECTOMY     BACK SURGERY     BIOPSY  04/27/2020   Procedure: BIOPSY;  Surgeon: Daneil Dolin, MD;  [facility];  Service: Endoscopy;;  ascending colon biopsy   CARDIAC CATHETERIZATION N/A 05/01/2016   Procedure: Right/Left Heart Cath and Coronary/Graft Angiography;  Surgeon: Leonie Man, MD;  Location: [place] [facility];  Service: Cardiovascular;  Laterality: N/A;   CHOLECYSTECTOMY     COLONOSCOPY  11/2011   Dr. Magod:ext/int hemorrhoids, diverticulosis sigmoid colon and distal desc colon, mid-desc colon hyperplastic polyps.    COLONOSCOPY WITH PROPOFOL N/A 04/27/2020   Rourk: Diverticulosis, hemorrhoids, normal terminal ileum.  Random colon biopsies significant for chronic lymphocytic colitis   CORONARY ARTERY BYPASS GRAFT  1996   2 vessels   ESOPHAGOGASTRODUODENOSCOPY  11/2011   Dr. Watt Climes: small hiatal hernia, one non-bleeding superficial gastric ulcer, medium-sized diverticulum in area of papilla   ESOPHAGOGASTRODUODENOSCOPY N/A 12/29/2015   Dr. Gala Romney: Chronic inactive gastritis, normal esophagus status post dilation, duodenal diverticula   ESOPHAGOGASTRODUODENOSCOPY (EGD) WITH PROPOFOL N/A 07/24/2019   Dr. Gala Romney: Normal esophagus status post empiric dilation, small hiatal hernia, large D2 diverticulum   ESOPHAGOGASTRODUODENOSCOPY (EGD) WITH PROPOFOL N/A 06/03/2021   Procedure: ESOPHAGOGASTRODUODENOSCOPY (EGD) WITH PROPOFOL;  Surgeon: Daneil Dolin, MD;  [facility];  Service: Endoscopy;  Laterality: N/A;  9:00am   EYE SURGERY Bilateral    cataract removal   FOOT SURGERY     removal bone spur   FRACTURE SURGERY Right    arm   GIVENS CAPSULE STUDY  11/2012   Dr. Watt Climes: minimal gastritis, normal small bowel capsule   HERNIA REPAIR     hiatal hernia   ICD IMPLANT N/A 02/04/2021   Procedure: ICD IMPLANT;  Surgeon: Evans Lance, MD;  Location: [place] [facility];  Service: Cardiovascular;  Laterality: N/A;   MALONEY DILATION N/A 07/24/2019   Procedure: Venia Minks DILATION;  Surgeon: Daneil Dolin, MD;  [facility];  Service: Endoscopy;  Laterality: N/A;   MALONEY  DILATION N/A 06/03/2021   Procedure: Venia Minks DILATION;  Surgeon: Daneil Dolin, MD;  [facility];  Service: Endoscopy;  Laterality: N/A;   MAXIMUM ACCESS (MAS)POSTERIOR LUMBAR INTERBODY FUSION (PLIF) 1 LEVEL N/A 08/14/2013   Procedure:  MAXIMUM ACCESS SURGERY(MAS) POSTERIOR LUMBAR INTERBODY FUSION LUMBAR THREE-FOUR ;  Surgeon: Eustace Moore, MD;  Location: [place] [facility];  Service: Neurosurgery;  Laterality: N/A;   MAXIMUM ACCESS SURGERY(MAS) POSTERIOR LUMBAR INTERBODY FUSION LUMBAR THREE-FOUR    PTCA     RIGHT/LEFT HEART CATH AND CORONARY ANGIOGRAPHY N/A 03/26/2020   Procedure: RIGHT/LEFT HEART CATH AND CORONARY ANGIOGRAPHY;  Surgeon: Leonie Man, MD;  Location: [place] [facility];  Service: Cardiovascular;  Laterality: N/A;   SPINAL CORD STIMULATOR BATTERY EXCHANGE N/A 01/19/2021   Procedure: Spinal cord stimulator battery change;  Surgeon: Reece Agar, MD;  Location: [place];  Service: Neurosurgery;  Laterality: N/A;   SPINAL CORD STIMULATOR INSERTION N/A 06/16/2016   Procedure: LUMBAR  SPINAL CORD STIMULATOR INSERTION;  Surgeon: Clydell Hakim, MD;  Location: [place] [facility];  Service: Neurosurgery;  Laterality: N/A;  LUMBAR SPINAL CORD STIMULATOR INSERTION   tens  05/2016   TONSILLECTOMY       SOCIAL HISTORY:  Social History   Socioeconomic History   Marital status: Married    Spouse name: Not on file   Number of children: 3   Years of education: Not on file   Highest education level: Not on file  Occupational History   Not on file  Tobacco Use   Smoking status: Former    Packs/day: 0.75    Years: 15.00    Pack years: 11.25    Types: Cigarettes    Quit date: 11/20/1994    Years since quitting: 27.1   Smokeless tobacco: Never   Tobacco comments:    Quit in 1996  Vaping Use   Vaping Use: Never used  Substance and Sexual Activity   Alcohol use: Never    Alcohol/week: 0.0 standard drinks   Drug use: Never   Sexual activity: Yes  Other Topics Concern   Not on file   Social History Narrative   2 living children, one deceased age 45   Right handed   One story home   Drinks coffee am   Social Determinants of Health   Financial Resource Strain: Not on file  Food Insecurity: Not on file  Transportation Needs: Not on file  Physical Activity: Not on file  Stress: Not on file  Social Connections: Not on file  Intimate Partner Violence: Not on file    FAMILY HISTORY:  Family History  Problem Relation Age of Onset   Heart attack Father    Heart attack Mother    Bladder Cancer Brother    Cervical cancer Daughter        ?   Colon cancer Neg Hx     CURRENT MEDICATIONS:  Outpatient Encounter Medications as of 01/03/2022  Medication Sig   albuterol (PROVENTIL HFA;VENTOLIN HFA) 108 (90 Base) MCG/ACT inhaler Take 2 puffs 3 times a day and every 4 hours as needed.   allopurinol (ZYLOPRIM) 300 MG tablet Take 300 mg by mouth daily.   aspirin EC 81 MG EC tablet Take 1 tablet (81 mg total) by mouth daily.   atorvastatin (LIPITOR) 80 MG tablet Take 80 mg by mouth daily.   calcium carbonate (TUMS - DOSED IN MG ELEMENTAL CALCIUM) 500 MG chewable tablet Chew 1 tablet by mouth as needed for indigestion or heartburn.   Carboxymethylcellulose Sodium (THERATEARS OP) Place 1 drop into both eyes 3 (three) times daily as needed (dry eyes).   carvedilol (COREG) 6.25 MG tablet Take 6.25 mg by mouth 2 (two) times daily with a meal.   cholecalciferol (VITAMIN D3) 25 MCG (1000 UT) tablet Take 1,000 Units by mouth daily.   dapagliflozin propanediol (FARXIGA) 10 MG TABS tablet Take 1 tablet (10 mg total) by mouth daily before breakfast.   ferrous sulfate 325 (65 FE) MG tablet Take 325 mg by mouth daily with breakfast.   fexofenadine (ALLEGRA) 180 MG tablet Take 180 mg by mouth daily.   fluticasone (FLONASE) 50 MCG/ACT nasal spray Place 2 sprays into both nostrils daily.   furosemide (LASIX) 40 MG tablet Take 40 mg by mouth 2 (two) times daily.    gabapentin (NEURONTIN) 400  MG capsule TAKE 1 CAPSULE(400 MG) BY MOUTH THREE TIMES DAILY   HYDROcodone-acetaminophen (NORCO) 10-325 MG tablet Take 1 tablet by mouth every 6 (  six) hours as needed for moderate pain.   Lancets (ONETOUCH DELICA PLUS [id number]) MISC    leflunomide (ARAVA) 20 MG tablet Take 20 mg by mouth daily.   losartan (COZAAR) 100 MG tablet Take 100 mg by mouth daily.   MAGNESIUM-OXIDE 400 (240 Mg) MG tablet TAKE 1 TABLET(400 MG) BY MOUTH DAILY   meclizine (ANTIVERT) 12.5 MG tablet Take 12.5 mg by mouth 3 (three) times daily as needed for dizziness.   metFORMIN (GLUCOPHAGE-XR) 500 MG 24 hr tablet Take 1,000 mg by mouth 2 (two) times daily.   montelukast (SINGULAIR) 10 MG tablet Take 10 mg by mouth at bedtime.   Multiple Vitamins-Minerals (OCUVITE ADULT 50+ PO) Take 1 tablet by mouth daily.   nitroGLYCERIN (NITROSTAT) 0.4 MG SL tablet DISSOLVE 1 TABLET UNDER THE TONGUE EVERY 5 MINUTES AS  NEEDED FOR CHEST PAIN. MAX  OF 3 TABLETS IN 15 MINUTES. CALL 911 IF PAIN PERSISTS.   omeprazole (PRILOSEC) 20 MG capsule Take 20 mg by mouth daily.   potassium chloride SA (KLOR-CON M) 20 MEQ tablet Take 1 tablet (20 mEq total) by mouth daily.   spironolactone (ALDACTONE) 25 MG tablet Take 1 tablet (25 mg total) by mouth daily.   triamcinolone cream (KENALOG) 0.1 % Apply 1 application topically daily as needed (for irritation).   No facility-administered encounter medications on file as of 01/03/2022.    ALLERGIES:  Allergies  Allergen Reactions   Biaxin [Clarithromycin] Rash and Other (See Comments)    Blisters in mouth    Penicillins Rash and Other (See Comments)    Blisters in mouth Has patient had a PCN reaction causing immediate rash, facial/tongue/throat swelling, SOB or lightheadedness with hypotension: Yesyes Has patient had a PCN reaction causing severe rash involving mucus membranes or skin necrosis: Nono Has patient had a PCN reaction that required hospitalization Nono Has patient had a PCN reaction  occurring within the last 10 years: Nono If all of the above answers are "NO", then may proceed   Losartan Potassium     Other reaction(s): elevated creatinine   Metformin Hcl Er     Other reaction(s): diarrhea with 1000 mg     PHYSICAL EXAM:  ECOG PERFORMANCE STATUS: 1 - Symptomatic but completely ambulatory  There were no vitals filed for this visit. There were no vitals filed for this visit. Physical Exam Constitutional:      Appearance: Normal appearance.  HENT:     Head: Normocephalic and atraumatic.     Mouth/Throat:     Mouth: Mucous membranes are moist.  Eyes:     Extraocular Movements: Extraocular movements intact.     Pupils: Pupils are equal, round, and reactive to light.  Cardiovascular:     Rate and Rhythm: Regular rhythm. Tachycardia present.     Pulses: Normal pulses.     Heart sounds: Normal heart sounds.     Comments: HR 95 Pulmonary:     Effort: Pulmonary effort is normal.     Breath sounds: Normal breath sounds. Decreased air movement present.  Abdominal:     General: Bowel sounds are normal.     Palpations: Abdomen is soft.     Tenderness: There is no abdominal tenderness.  Musculoskeletal:        General: No swelling.     Right hand: Deformity present.     Left hand: Deformity present.     Right lower leg: No edema.     Left lower leg: No edema.     Comments: Contractures  and nodules of bilateral hands in the setting of rheumatoid arthritis  Lymphadenopathy:     Cervical: No cervical adenopathy.  Skin:    General: Skin is warm and dry.  Neurological:     General: No focal deficit present.     Mental Status: She is alert and oriented to person, place, and time.  Psychiatric:        Mood and Affect: Mood normal.        Behavior: Behavior normal.     LABORATORY DATA:  I have reviewed the labs as listed.  CBC    Component Value Date/Time   WBC 11.1 (H) 12/27/2021 1050   RBC 3.69 (L) 12/27/2021 1050   HGB 10.9 (L) 12/27/2021 1050   HGB  12.4 11/01/2015 1134   HCT 36.0 12/27/2021 1050   HCT 37.4 11/01/2015 1134   PLT 186 12/27/2021 1050   PLT 205 11/01/2015 1134   MCV 97.6 12/27/2021 1050   MCV 91.7 11/01/2015 1134   MCH 29.5 12/27/2021 1050   MCHC 30.3 12/27/2021 1050   RDW 15.5 12/27/2021 1050   RDW 15.0 (H) 11/01/2015 1134   LYMPHSABS 2.7 12/27/2021 1050   LYMPHSABS 2.9 11/01/2015 1134   MONOABS 0.8 12/27/2021 1050   MONOABS 0.7 11/01/2015 1134   EOSABS 0.2 12/27/2021 1050   EOSABS 0.4 11/01/2015 1134   BASOSABS 0.1 12/27/2021 1050   BASOSABS 0.0 11/01/2015 1134   CMP Latest Ref Rng & Units 12/19/2021 10/17/2021 06/23/2021  Glucose 70 - 99 mg/dL 147(H) 123(H) 122(H)  BUN 8 - 23 mg/dL 33(H) 21 18  Creatinine 0.44 - 1.00 mg/dL 1.01(H) 0.95 0.94  Sodium 135 - 145 mmol/L 135 135 136  Potassium 3.5 - 5.1 mmol/L 4.5 4.9 4.1  Chloride 98 - 111 mmol/L 98 100 100  CO2 22 - 32 mmol/L $RemoveB'26 25 27  'GMXnlHzq$ Calcium 8.9 - 10.3 mg/dL 9.3 9.3 8.7(L)  Total Protein 6.5 - 8.1 g/dL - 6.7 7.4  Total Bilirubin 0.3 - 1.2 mg/dL - 0.4 0.6  Alkaline Phos 38 - 126 U/L - 120 84  AST 15 - 41 U/L - 18 16  ALT 0 - 44 U/L - 23 11    DIAGNOSTIC IMAGING:  I have independently reviewed the relevant imaging and discussed with the patient.  ASSESSMENT & PLAN: 1.  Normocytic anemia  - Previously seen by Dr. Earlie Server for anemia of chronic disease +/- iron deficiency.  She was discharged from hematology clinic in 2016 as her anemia had been stable on Nu-iron supplement. - Labs sent by PCP (09/16/2021) shows normocytic anemia with Hgb 9.6/MCV 92.2, iron saturation 11%, TIBC 373.  (Ferritin not checked.)  She had normal creatinine 0.94 when checked on 06/23/2021. - No history of blood transfusion.  Received IV Feraheme in 2015 and in August 2022. - Currently taking iron supplement Nu-iron once daily.  Also takes pantoprazole and aspirin 81 mg daily at home. - Reports episode of dark stool in the summer 2022, and followed with GI.  No current symptoms of  bright red blood per rectum or melena.   - EGD (06/03/2021 by Dr. Gala Romney): Normal esophagus, stomach, duodenum - Colonoscopy (04/27/2020 by Dr. Gala Romney): Internal hemorrhoids and diverticulosis - Hemoccult stool x3 negative (December 2022) - Work-up of anemia (10/17/2021) showed Hgb 8.9/MCV 101.0.  Revealed iron saturation 15% but elevated ferritin 329 (high suspicion that elevated ferritin was acute phase reactant, as multiple inflammatory markers were also positive and patient has underlying RA).  Normal folate, copper, methylmalonic acid.  Elevated B12 2718 (likely acute phase reactant).  SPEP negative for M spike.  Immunofixation negative for monoclonal protein.  Mildly elevated kappa light chains 41.1/normal lambda 25.6, normal ratio 1.61. - She received IV Feraheme x2 on 10/21/2021 and 10/28/2021, but without any significant improvement in symptoms.  She continues to have fatigue, palpitations, restless legs, dyspnea on exertion.  Blank - No bright red blood per rectum or melena - She continues to have chronic fatigue that is somewhat improved after IV iron - Most recent labs (12/27/2021): Hgb 10.9, ferritin 961, iron saturation 25% with TIBC 420 - Differential diagnosis favors anemia of chronic disease with functional iron deficiency - PLAN: No indication for IV iron supplementation at this time.  Hemoglobin improved after IV iron in December 2022. - Repeat labs and RTC in 4 months.   - Patient instructed to continue oral iron supplementation, but to take it at least 4 hours after her PPI, along with a glass of orange juice to improve absorption. - If she has persistent anemia despite iron repletion, we will consider bone marrow biopsy.  2.  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 Earlie Server, who thought patient to have reactive leukocytosis. - BCR/ABL by PCR testing was negative in  January 2014 - JAK2 with reflex was negative - Inflammatory markers positive (10/17/2021): CRP 2.4, ESR 76, elevated B12 and ferritin - She had COVID-19 infection in November 2022. - She uses steroid inhaler (Trelegy) and intermittent Kenalog cream. - She is a current non-smoker. - She has underlying rheumatoid arthritis. - She denies any B symptoms such as fever, chills, night sweats, unintentional weight loss.  No new lumps or bumps.  - Most recent labs (12/27/2021): WBC 11.1 with normal differential - Differential diagnosis favors reactive leukocytosis in the setting of rheumatoid arthritis and chronic disease - PLAN: No further work-up or intervention at this time.  We will repeat CBC at follow-up visit in 4 months.   PLAN SUMMARY & DISPOSITION: Repeat labs and RTC in 4 months  All questions were answered. The patient knows to call the clinic with any problems, questions or concerns.  Medical decision making: Low  Time spent on visit: I spent 15 minutes counseling the patient face to face. The total time spent in the appointment was 25 minutes and more than 50% was on counseling.   Harriett Rush, PA-C  01/03/2022 8:44 AM

## 2022-01-03 ENCOUNTER — Inpatient Hospital Stay (HOSPITAL_COMMUNITY): Payer: Medicare Other | Admitting: Physician Assistant

## 2022-01-03 ENCOUNTER — Other Ambulatory Visit: Payer: Self-pay

## 2022-01-03 VITALS — BP 118/65 | HR 95 | Temp 98.0°F | Resp 17 | Ht 62.0 in | Wt 130.7 lb

## 2022-01-03 DIAGNOSIS — D72828 Other elevated white blood cell count: Secondary | ICD-10-CM | POA: Diagnosis not present

## 2022-01-03 DIAGNOSIS — D72829 Elevated white blood cell count, unspecified: Secondary | ICD-10-CM | POA: Diagnosis not present

## 2022-01-03 DIAGNOSIS — Z7984 Long term (current) use of oral hypoglycemic drugs: Secondary | ICD-10-CM | POA: Diagnosis not present

## 2022-01-03 DIAGNOSIS — D509 Iron deficiency anemia, unspecified: Secondary | ICD-10-CM | POA: Diagnosis not present

## 2022-01-03 DIAGNOSIS — M542 Cervicalgia: Secondary | ICD-10-CM | POA: Diagnosis not present

## 2022-01-03 DIAGNOSIS — D649 Anemia, unspecified: Secondary | ICD-10-CM

## 2022-01-03 DIAGNOSIS — Z7982 Long term (current) use of aspirin: Secondary | ICD-10-CM | POA: Diagnosis not present

## 2022-01-03 DIAGNOSIS — Z79899 Other long term (current) drug therapy: Secondary | ICD-10-CM | POA: Diagnosis not present

## 2022-01-03 DIAGNOSIS — M069 Rheumatoid arthritis, unspecified: Secondary | ICD-10-CM | POA: Diagnosis not present

## 2022-01-03 NOTE — Patient Instructions (Addendum)
Katherine Larson at [facility] Discharge Instructions  You were seen today by Tarri Abernethy PA-C for your anemia related to chronic disease and low iron.  Your blood and iron levels are improved after the IV iron you received in December.  You do not need any additional IV iron at this time.  Your white blood cell count remains mildly elevated, but stable.  As we discussed, this is likely reactive due to your underlying rheumatoid arthritis and inhaled steroid use (Trelegy).  MEDICATIONS: Temporarily stop your iron pill until your iron levels have come down a bit.  LABS: Return in 4 months for repeat labs  FOLLOW-UP APPOINTMENT: Office visit in 4 months, after labs   Thank you for choosing [place] at [facility] to provide your oncology and hematology care.  To afford each patient quality time with our provider, please arrive at least 15 minutes before your scheduled appointment time.   If you have a lab appointment with the [facility] please come in thru the Main Entrance and check in at the main information desk.  You need to re-schedule your appointment should you arrive 10 or more minutes late.  We strive to give you quality time with our providers, and arriving late affects you and other patients whose appointments are after yours.  Also, if you no show three or more times for appointments you may be dismissed from the clinic at the providers discretion.     Again, thank you for choosing [facility].  Our hope is that these requests will decrease the amount of time that you wait before being seen by our physicians.       _____________________________________________________________  Should you have questions after your visit to [facility], please contact our office at [phone] and follow the prompts.  Our office hours are 8:00 a.m. and 4:30 p.m. Monday - Friday.  Please note that voicemails left after 4:00  p.m. may not be returned until the following business day.  We are closed weekends and major holidays.  You do have access to a nurse 24-7, just call the main number to the clinic [phone] and do not press any options, hold on the line and a nurse will answer the phone.    For prescription refill requests, have your pharmacy contact our office and allow 72 hours.    Due to Covid, you will need to wear a mask upon entering the hospital. If you do not have a mask, a mask will be given to you at the Main Entrance upon arrival. For doctor visits, patients may have 1 support person age 64 or older with them. For treatment visits, patients can not have anyone with them due to social distancing guidelines and our immunocompromised population.

## 2022-01-09 DIAGNOSIS — M25561 Pain in right knee: Secondary | ICD-10-CM | POA: Diagnosis not present

## 2022-01-09 DIAGNOSIS — M1712 Unilateral primary osteoarthritis, left knee: Secondary | ICD-10-CM | POA: Diagnosis not present

## 2022-01-09 DIAGNOSIS — M25562 Pain in left knee: Secondary | ICD-10-CM | POA: Diagnosis not present

## 2022-01-09 DIAGNOSIS — M1711 Unilateral primary osteoarthritis, right knee: Secondary | ICD-10-CM | POA: Diagnosis not present

## 2022-01-10 ENCOUNTER — Other Ambulatory Visit: Payer: Self-pay | Admitting: Gastroenterology

## 2022-01-10 ENCOUNTER — Other Ambulatory Visit: Payer: Self-pay | Admitting: Internal Medicine

## 2022-01-10 ENCOUNTER — Ambulatory Visit (HOSPITAL_COMMUNITY)
Admission: RE | Admit: 2022-01-10 | Discharge: 2022-01-10 | Disposition: A | Discharge status: Home / Self Care | Payer: Medicare Other | Source: Ambulatory Visit | Attending: Internal Medicine | Admitting: Internal Medicine

## 2022-01-10 ENCOUNTER — Other Ambulatory Visit: Payer: Self-pay

## 2022-01-10 VITALS — BP 122/66 | HR 90 | Wt 132.2 lb

## 2022-01-10 DIAGNOSIS — Z7982 Long term (current) use of aspirin: Secondary | ICD-10-CM | POA: Diagnosis not present

## 2022-01-10 DIAGNOSIS — I255 Ischemic cardiomyopathy: Secondary | ICD-10-CM | POA: Insufficient documentation

## 2022-01-10 DIAGNOSIS — M069 Rheumatoid arthritis, unspecified: Secondary | ICD-10-CM | POA: Insufficient documentation

## 2022-01-10 DIAGNOSIS — Z79899 Other long term (current) drug therapy: Secondary | ICD-10-CM | POA: Diagnosis not present

## 2022-01-10 DIAGNOSIS — Z951 Presence of aortocoronary bypass graft: Secondary | ICD-10-CM | POA: Diagnosis not present

## 2022-01-10 DIAGNOSIS — M545 Low back pain, unspecified: Secondary | ICD-10-CM | POA: Diagnosis not present

## 2022-01-10 DIAGNOSIS — Z7984 Long term (current) use of oral hypoglycemic drugs: Secondary | ICD-10-CM | POA: Diagnosis not present

## 2022-01-10 DIAGNOSIS — I5022 Chronic systolic (congestive) heart failure: Secondary | ICD-10-CM | POA: Diagnosis not present

## 2022-01-10 DIAGNOSIS — I2510 Atherosclerotic heart disease of native coronary artery without angina pectoris: Secondary | ICD-10-CM | POA: Diagnosis not present

## 2022-01-10 DIAGNOSIS — I447 Left bundle-branch block, unspecified: Secondary | ICD-10-CM | POA: Diagnosis not present

## 2022-01-10 LAB — BASIC METABOLIC PANEL
Anion gap: 9 (ref 5–15)
BUN: 34 mg/dL — ABNORMAL HIGH (ref 8–23)
CO2: 30 mmol/L (ref 22–32)
Calcium: 9.6 mg/dL (ref 8.9–10.3)
Chloride: 96 mmol/L — ABNORMAL LOW (ref 98–111)
Creatinine, Ser: 1.06 mg/dL — ABNORMAL HIGH (ref 0.44–1.00)
GFR, Estimated: 55 mL/min — ABNORMAL LOW (ref >60)
Glucose, Bld: 126 mg/dL — ABNORMAL HIGH (ref 70–99)
Potassium: 4.7 mmol/L (ref 3.5–5.1)
Sodium: 135 mmol/L (ref 135–145)

## 2022-01-10 MED ORDER — CARVEDILOL 12.5 MG PO TABS: 12.5000 mg | ORAL_TABLET | Freq: Two times a day (BID) | ORAL | 3 refills | Status: DC

## 2022-01-10 NOTE — Patient Instructions (Addendum)
It was a pleasure seeing you today!  MEDICATIONS: -We are changing your medications today -Increase carvedilol to 12.5 mg (1 tablet) twice daily. You may take 2 tablets of the 6.25 mg twice daily until you receive the new strength.  -Call if you have questions about your medications.  LABS: -We will call you if your labs need attention.  NEXT APPOINTMENT: Return to clinic in 6 weeks with Dr. Aundra Dubin.  In general, to take care of your heart failure: -Limit your fluid intake to 2 Liters (half-gallon) per day.   -Limit your salt intake to ideally 2-3 grams (2000-3000 mg) per day. -Weigh yourself daily and record, and bring that "weight diary" to your next appointment.  (Weight gain of 2-3 pounds in 1 day typically means fluid weight.) -The medications for your heart are to help your heart and help you live longer.   -Please contact us before stopping any of your heart medications.  Call the clinic at [phone] with questions or to reschedule future appointments.

## 2022-01-10 NOTE — Telephone Encounter (Signed)
This is a [place] pt.  °

## 2022-01-12 DIAGNOSIS — M0579 Rheumatoid arthritis with rheumatoid factor of multiple sites without organ or systems involvement: Secondary | ICD-10-CM | POA: Diagnosis not present

## 2022-01-12 DIAGNOSIS — M1009 Idiopathic gout, multiple sites: Secondary | ICD-10-CM | POA: Diagnosis not present

## 2022-01-12 DIAGNOSIS — Z79899 Other long term (current) drug therapy: Secondary | ICD-10-CM | POA: Diagnosis not present

## 2022-02-02 DIAGNOSIS — M25561 Pain in right knee: Secondary | ICD-10-CM | POA: Diagnosis not present

## 2022-02-02 DIAGNOSIS — M1712 Unilateral primary osteoarthritis, left knee: Secondary | ICD-10-CM | POA: Diagnosis not present

## 2022-02-02 DIAGNOSIS — M1711 Unilateral primary osteoarthritis, right knee: Secondary | ICD-10-CM | POA: Diagnosis not present

## 2022-02-03 ENCOUNTER — Ambulatory Visit (INDEPENDENT_AMBULATORY_CARE_PROVIDER_SITE_OTHER): Payer: Medicare Other

## 2022-02-03 DIAGNOSIS — I5022 Chronic systolic (congestive) heart failure: Secondary | ICD-10-CM

## 2022-02-03 DIAGNOSIS — I255 Ischemic cardiomyopathy: Secondary | ICD-10-CM

## 2022-02-03 LAB — CUP PACEART REMOTE DEVICE CHECK
Battery Remaining Longevity: 110 mo
Battery Remaining Percentage: 90 %
Battery Voltage: 3.01 V
Brady Statistic RV Percent Paced: 1 %
Date Time Interrogation Session: 20230317030124
HighPow Impedance: 75 Ohm
Implantable Lead Implant Date: 20220318
Implantable Lead Location: 753860
Implantable Pulse Generator Implant Date: 20220318
Lead Channel Impedance Value: 450 Ohm
Lead Channel Pacing Threshold Amplitude: 0.75 V
Lead Channel Pacing Threshold Pulse Width: 0.5 ms
Lead Channel Sensing Intrinsic Amplitude: 12 mV
Lead Channel Setting Pacing Amplitude: 2.5 V
Lead Channel Setting Pacing Pulse Width: 0.5 ms
Lead Channel Setting Sensing Sensitivity: 0.5 mV
Pulse Gen Serial Number: 810021948

## 2022-02-07 DIAGNOSIS — M542 Cervicalgia: Secondary | ICD-10-CM | POA: Diagnosis not present

## 2022-02-09 DIAGNOSIS — M1712 Unilateral primary osteoarthritis, left knee: Secondary | ICD-10-CM | POA: Diagnosis not present

## 2022-02-09 DIAGNOSIS — M1711 Unilateral primary osteoarthritis, right knee: Secondary | ICD-10-CM | POA: Diagnosis not present

## 2022-02-09 NOTE — Progress Notes (Signed)
Remote ICD transmission.   

## 2022-02-09 NOTE — Progress Notes (Signed)
? ? ?Referring Provider: Harlan Stains, MD ?Primary Care Physician:  Harlan Stains, MD ?Primary GI Physician: Dr. Gala Romney ? ?Chief Complaint  ?Patient presents with  ? Follow-up  ? ? ?HPI:   ?Katherine Larson is a 76 y.o. 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  ? ?Dysphagia previously evaluated with EGD in 2020, esophagus dilated, but she has persistent symptoms.  BPE November 2020 with moderate esophageal dysmotility with prolonged thoracic retention, laryngeal penetration to the level of vocal cords, prominent GERD.  She was offered modified barium swallow study and possible esophageal manometry, but patient declined. ? ?Last seen in our office March 07, 2021.  Reported she had been taking Entocort 3 mg daily for the last 4 months, but had run out 1 week ago.  She had been having more trouble with diarrhea since having 2 surgeries in March (spinal cord stimulator exchange and ICD implant).  Typically with 3-4 bowel movements daily with 1 bowel movement overnight, though the night prior to her office visit, she had 5 bowel movements.  She was using Imodium.  No rectal bleeding.  Stools are chronically dark on iron.  Denied abdominal pain.  Also with daily GERD symptoms on Protonix 40 mg twice daily.  Continued with intermittent solid food and pill dysphagia.  Also reported early satiety and stated she had previously been on Reglan, but pharmacist recommended stopping this a few years ago.  Plan included checking stool studies and if negative, then increase Entocort, stop Protonix and start Dexilant, BPE to evaluate dysphagia, and update anemia labs.  Recommended 8-week follow-up. ? ?Labs completed 03/10/2021: Hemoglobin 10.6, iron panel within normal limits, BMP without significant abnormalities. ? ?Stool studies were not completed.  Spoke with patient on 6/14.  She reported alternating between constipation and  diarrhea.  Having no more than 1 watery stool per day and no more than 2 bowel movements per day in general.  Recommended starting Benefiber 2 teaspoons daily x2 weeks and increasing to twice daily as well as adding a daily probiotic.  Requested 4-week progress report. ? ?BPE 03/15/2021: Esophageal dysmotility, likely presbyesophagus, small hiatal hernia, impression upon contrast column from posterior hypopharynx with recommendations to consider direct visualization, laryngeal penetration without aspiration, spontaneous GERD. ? ?She was scheduled for EGD with possible dilation on 06/03/2021 which revealed a normal esophagus, normal stomach, normal duodenum.  Abnormal hypopharyngeal mucosa bilaterally just distal to the base of the tongue (right side greater than left), overlying mucosa appeared irregular and somewhat verrucous in appearance.  Recommended ENT evaluation. ? ?Examined by ENT on 07/08/21 who stated patient had no mucosal lesions in the hypopharynx or larynx on exam. 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 Recommend consideration for Botox injections of the cricopharyngeus. Last resort option would be surgical resection of the cricopharyngeal muscle.  ? ?She saw Dr. Joya Gaskins with ENT/Head and Neck Surgery on 09/07/21. She had FEES the same day just prior to this visit. She was found to have slight-mild pharyngeal dysphagia, likely impacted by generalized deconditioning and age-related changes to the swallowing mechanism. Recommended regular solids/thin liquids, with adherence to aspiration precautions and swallowing strategies including alternating liquids and solids and performing intermittent post-swallow throat clearing with liquids. Overall, Dr. Joya Gaskins felt she would do relatively well with conservative treatment. Recommended formal esophagram and an evaluation in the voice lab and return for follow-up on the same day. I do not  see that this was completed.   ? ?Today:  ?BP is low at 85/54.  Reports on a day to day basis since January, her BP is 80s-90s/50s.  Reports her carvedilol was increased at that time to twice daily.  Chronic issues with intermittent lightheadedness without change.  No blurry vision or one-sided weakness. ? ?Prior diarrhea has resolved.  ? ?Having constipation. 2-3 Bms weekly. Takes stool 1 stool softener every other daily. No brbpr or melena.  No abdominal pain, but has associated abdominal bloating. ? ?GERD: ?Does not think she ever tried Dexilant.  States she actually started doing better and completely stopped all of her acid reflux medications.  Then she started having return of reflux and her primary care resume Protonix twice a day which is now working very well for her.  Denies nausea or vomiting. ? ?Weight loss: ?Down 17 lbs since last visit.  Unintentional.  Reports some early satiety, but also not eating when she is hungry.  States she will eat, get full quickly, but then an hour be hungry again, but does not eat. Just nibbling during the day. Eats breakfast and small portion for dinner per her husband. ? ?Dysphagia:  ?Better than it has been in a while.  She has been taking the recommendations from the speech therapist that she saw and is being careful with what she is eating.  She avoids certain meats, but tends to do okay with ground meats and chicken.  Eat slowly.  Occasionally, she has some trouble with pills, but nothing significant. ? ?Anemia:  ?No BRBPR, melena, vaginal bleeding, hematuria, epistaxis. ?No NSAIDs.  ? ? ?Past Medical History:  ?Diagnosis Date  ? Anemia   ? anemia of chronic disease +/- IDA followed by Dr. Earlie Server. received iron infusions in the past  ? Asthma   ? Borderline glaucoma   ? CAD (coronary artery disease)   ? a. s/p CABG 1996. b. low risk nuc 2011. c. LHC 04/2016 due to drop in EF -> occluded native LAD,  widely patent sequential LIMA-D1-LAD.  ? Chronic kidney disease   ? "mild stage of kidney disease"   ? Chronic systolic CHF (congestive heart failure) ([place])   ? Complication of anesthesia   ? DM2 (diabetes mellitus, type 2) ([place])   ? not on any medicine for this at this time, 05/2016  ? Dyspnea   ? with exertion  ? GERD (gastroesophageal reflux disease)   ? Gout   ? History of blood transfusion   ? History of hiatal hernia   ? in 57s  ? History of pneumonia   ? March, 2016  ? HLD (hyperlipidemia)   ? HTN (hypertension)   ? Hyperthyroidism 01/21/2016  ? Inappropriate sinus tachycardia   ? LBBB (left bundle branch block)   ? a. Seen in 04/2016  ? 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 Macular degeneration   ? left  ? Myocardial infarction [facility])   ? 1996  ? Neuropathy   ? Neuropathy   ? NICM (nonischemic cardiomyopathy) ([place])   ? a. Dx 04/2016 - EF 25-30%, diffuse HK, elevated LVEDP, mild MR, mod LAE.  ? Normocytic anemia 10/17/2021  ? NSVT (nonsustained ventricular tachycardia)   ? PAT (paroxysmal atrial tachycardia) ([place])   ? Pneumonia   ? PONV (postoperative nausea and vomiting)   ? "after just about every surgery I've had"  ? Rheumatoid arthritis ([place])   ? RA- hands  ? Seasonal allergies   ? ? ?Past Surgical History:  ?  Procedure Laterality Date  ? ABDOMINAL HYSTERECTOMY    ? APPENDECTOMY    ? BACK SURGERY    ? BIOPSY  04/27/2020  ? Procedure: BIOPSY;  Surgeon: Daneil Dolin, MD;  Location: [facility];  Service: Endoscopy;;  ascending colon biopsy  ? CARDIAC CATHETERIZATION N/A 05/01/2016  ? Procedure: Right/Left Heart Cath and Coronary/Graft Angiography;  Surgeon: Leonie Man, MD;  Location: [facility];  Service: Cardiovascular;  Laterality: N/A;  ? CHOLECYSTECTOMY    ? COLONOSCOPY  11/2011  ? Dr. Magod:ext/int hemorrhoids, diverticulosis sigmoid colon and distal desc colon, mid-desc colon hyperplastic polyps.   ? COLONOSCOPY WITH PROPOFOL N/A 04/27/2020  ? Rourk: Diverticulosis, hemorrhoids, normal terminal ileum.  Random colon biopsies significant for chronic lymphocytic colitis  ? CORONARY ARTERY BYPASS  GRAFT  1996  ? 2 vessels  ? ESOPHAGOGASTRODUODENOSCOPY  11/2011  ? Dr. Watt Climes: small hiatal hernia, one non-bleeding superficial gastric ulcer, medium-sized diverticulum in area of papilla  ? ESOPHAGOGASTRODUOD

## 2022-02-10 ENCOUNTER — Other Ambulatory Visit: Payer: Self-pay

## 2022-02-10 ENCOUNTER — Telehealth: Payer: Self-pay | Admitting: Internal Medicine

## 2022-02-10 ENCOUNTER — Ambulatory Visit: Payer: Medicare Other | Admitting: Gastroenterology

## 2022-02-10 ENCOUNTER — Encounter: Payer: Self-pay | Admitting: Gastroenterology

## 2022-02-10 VITALS — BP 82/58 | HR 76 | Temp 97.4°F | Ht 62.0 in | Wt 131.6 lb

## 2022-02-10 DIAGNOSIS — I959 Hypotension, unspecified: Secondary | ICD-10-CM | POA: Diagnosis not present

## 2022-02-10 DIAGNOSIS — K219 Gastro-esophageal reflux disease without esophagitis: Secondary | ICD-10-CM | POA: Diagnosis not present

## 2022-02-10 DIAGNOSIS — R634 Abnormal weight loss: Secondary | ICD-10-CM | POA: Diagnosis not present

## 2022-02-10 DIAGNOSIS — K5900 Constipation, unspecified: Secondary | ICD-10-CM | POA: Diagnosis not present

## 2022-02-10 DIAGNOSIS — D649 Anemia, unspecified: Secondary | ICD-10-CM | POA: Diagnosis not present

## 2022-02-10 DIAGNOSIS — R1310 Dysphagia, unspecified: Secondary | ICD-10-CM | POA: Diagnosis not present

## 2022-02-10 MED ORDER — CARVEDILOL 6.25 MG PO TABS: 6.2500 mg | ORAL_TABLET | Freq: Two times a day (BID) | ORAL | 3 refills | Status: DC

## 2022-02-10 NOTE — Telephone Encounter (Signed)
Hold coreg. ? ?When systolic gets back over [id number] back carvedilol 6.25 mg PO BID. ? ?GT discussed with GI doctor.  See above orders. ?

## 2022-02-10 NOTE — Telephone Encounter (Signed)
Pt c/o BP issue: STAT if pt c/o blurred vision, one-sided weakness or slurred speech ? ?1. What are your last 5 BP readings?  ?In office: 85/54 ?At home: 76/54 ? ?2. Are you having any other symptoms (ex. Dizziness, headache, blurred vision, passed out)? Intermittent lightheadedness, but it is chronic  ? ?3. What is your BP issue? Patient is at [facility] and they need advice as to what to do for this patient regaridng her BP readings ? ?The patient says her BP has been running low after the addition of an additional carvediolol  ?

## 2022-02-10 NOTE — Patient Instructions (Addendum)
Your blood pressure is low today:  ?I spoke with Dr. Tanna Furry office as I was not able to reach Dr. Claris Gladden office. They have recommend holding carvedilol and monitoring your blood pressure. If your blood pressure goes up above 120, top number, then you can resume 1/2 tablet carvedilol twice daily.  ?Please call your cardiologist on Monday to follow-up.  ?If you have any lightheadedness, dizziness, feel like you are going to pass out, significant weakness over the weekend, or if your blood pressure is persistently low, proceed to the emergency room. ? ?For constipation: ?Start taking your stool softener every day.  After 1 week, if you are still not having productive bowel movements regularly, you can try increasing to 2 stool softeners daily. ?If the stool softeners alone are not working well for you, start MiraLAX 1 capful (17 g) daily in 8 ounces of water. ? ?For reflux: ?Continue taking Protonix 40 mg twice daily 30 minutes before breakfast and dinner. ? ?Swallowing trouble: ?I am glad you are doing better with this overall. ?Follow-up with [facility] when you are able. ? ?For weight loss: ?I suspect this is secondary to decreased oral intake. ?Please try eating 4-6 small meals daily. ?I also recommend you add 1-2 protein shakes every day to supplement your oral intake. ?Please come back to the office in 8 weeks for a weight check. ? ?We will follow-up with you in 3 months. Please call sooner if needed.  ? ?It was good to see you today! ? ?Aliene Altes, PA-C ?[facility] ? ?

## 2022-02-13 DIAGNOSIS — M961 Postlaminectomy syndrome, not elsewhere classified: Secondary | ICD-10-CM | POA: Diagnosis not present

## 2022-02-13 DIAGNOSIS — Z9689 Presence of other specified functional implants: Secondary | ICD-10-CM | POA: Diagnosis not present

## 2022-02-13 DIAGNOSIS — M542 Cervicalgia: Secondary | ICD-10-CM | POA: Diagnosis not present

## 2022-02-14 NOTE — Telephone Encounter (Signed)
Called Katherine Larson back about her message Katherine Larson has been holding her coreg and yesterday BP 108/68 HR 98 and today BP 105/63 HR 91. Katherine Larson stated she is worried about her HR being too high and was told it needs to be in the 60's. Will forward to Dr. [name] and his nurse for further advisement. ?

## 2022-02-14 NOTE — Telephone Encounter (Signed)
Sent mychart message advising normal heart rate is 60-100. ? ?Advised to continue to hold Coreg. ? ?She sees heart failure next week.  Then can address concerns at that visit. ?

## 2022-02-14 NOTE — Telephone Encounter (Signed)
Remote transmission reviewed. Normal device function and no events noted on transmission. Patient called and updated.  ?

## 2022-02-14 NOTE — Telephone Encounter (Signed)
Pt c/o BP issue: STAT if pt c/o blurred vision, one-sided weakness or slurred speech ? ?1. What are your last 5 BP readings?  ?82/51  ?83/57 92 ?78/54 91 ?84/58 86 ?86/57 92 ? ?2. Are you having any other symptoms (ex. Dizziness, headache, blurred vision, passed out)?  ?No symptoms  ? ?3. What is your BP issue?  ? ?Patient is following up to report BP/HR readings due to medication changes. She states her BP has still been low and HR is elevated. ?

## 2022-02-16 DIAGNOSIS — M1711 Unilateral primary osteoarthritis, right knee: Secondary | ICD-10-CM | POA: Diagnosis not present

## 2022-02-16 DIAGNOSIS — M1712 Unilateral primary osteoarthritis, left knee: Secondary | ICD-10-CM | POA: Diagnosis not present

## 2022-02-20 ENCOUNTER — Other Ambulatory Visit: Payer: Self-pay | Admitting: Family Medicine

## 2022-02-20 DIAGNOSIS — R911 Solitary pulmonary nodule: Secondary | ICD-10-CM

## 2022-02-21 ENCOUNTER — Encounter (HOSPITAL_COMMUNITY): Payer: Self-pay | Admitting: Cardiology

## 2022-02-21 ENCOUNTER — Ambulatory Visit (HOSPITAL_COMMUNITY)
Admission: RE | Admit: 2022-02-21 | Discharge: 2022-02-21 | Disposition: A | Discharge status: Home / Self Care | Payer: Medicare Other | Source: Ambulatory Visit | Attending: Cardiology | Admitting: Cardiology

## 2022-02-21 VITALS — BP 138/70 | HR 100 | Wt 133.6 lb

## 2022-02-21 DIAGNOSIS — I255 Ischemic cardiomyopathy: Secondary | ICD-10-CM | POA: Diagnosis not present

## 2022-02-21 DIAGNOSIS — I25810 Atherosclerosis of coronary artery bypass graft(s) without angina pectoris: Secondary | ICD-10-CM

## 2022-02-21 DIAGNOSIS — I110 Hypertensive heart disease with heart failure: Secondary | ICD-10-CM | POA: Diagnosis not present

## 2022-02-21 DIAGNOSIS — I5022 Chronic systolic (congestive) heart failure: Secondary | ICD-10-CM | POA: Insufficient documentation

## 2022-02-21 DIAGNOSIS — Z79899 Other long term (current) drug therapy: Secondary | ICD-10-CM | POA: Diagnosis not present

## 2022-02-21 DIAGNOSIS — M069 Rheumatoid arthritis, unspecified: Secondary | ICD-10-CM | POA: Insufficient documentation

## 2022-02-21 DIAGNOSIS — Z951 Presence of aortocoronary bypass graft: Secondary | ICD-10-CM | POA: Diagnosis not present

## 2022-02-21 DIAGNOSIS — I5042 Chronic combined systolic (congestive) and diastolic (congestive) heart failure: Secondary | ICD-10-CM | POA: Diagnosis not present

## 2022-02-21 DIAGNOSIS — Z7982 Long term (current) use of aspirin: Secondary | ICD-10-CM | POA: Diagnosis not present

## 2022-02-21 DIAGNOSIS — M545 Low back pain, unspecified: Secondary | ICD-10-CM | POA: Diagnosis not present

## 2022-02-21 DIAGNOSIS — E782 Mixed hyperlipidemia: Secondary | ICD-10-CM

## 2022-02-21 DIAGNOSIS — I2510 Atherosclerotic heart disease of native coronary artery without angina pectoris: Secondary | ICD-10-CM | POA: Diagnosis not present

## 2022-02-21 DIAGNOSIS — I447 Left bundle-branch block, unspecified: Secondary | ICD-10-CM | POA: Diagnosis not present

## 2022-02-21 LAB — LIPID PANEL
Cholesterol: 120 mg/dL (ref 0–200)
HDL: 27 mg/dL — ABNORMAL LOW (ref >40)
LDL Cholesterol: 45 mg/dL (ref 0–99)
Total CHOL/HDL Ratio: 4.4 RATIO
Triglycerides: 238 mg/dL — ABNORMAL HIGH (ref <150)
VLDL: 48 mg/dL — ABNORMAL HIGH (ref 0–40)

## 2022-02-21 LAB — BASIC METABOLIC PANEL
Anion gap: 12 (ref 5–15)
BUN: 32 mg/dL — ABNORMAL HIGH (ref 8–23)
CO2: 27 mmol/L (ref 22–32)
Calcium: 9.6 mg/dL (ref 8.9–10.3)
Chloride: 99 mmol/L (ref 98–111)
Creatinine, Ser: 1.21 mg/dL — ABNORMAL HIGH (ref 0.44–1.00)
GFR, Estimated: 47 mL/min — ABNORMAL LOW (ref >60)
Glucose, Bld: 113 mg/dL — ABNORMAL HIGH (ref 70–99)
Potassium: 4.5 mmol/L (ref 3.5–5.1)
Sodium: 138 mmol/L (ref 135–145)

## 2022-02-21 LAB — BRAIN NATRIURETIC PEPTIDE: B Natriuretic Peptide: 393.4 pg/mL — ABNORMAL HIGH (ref 0.0–100.0)

## 2022-02-21 MED ORDER — CARVEDILOL 6.25 MG PO TABS: 9.3750 mg | ORAL_TABLET | Freq: Two times a day (BID) | ORAL | 3 refills | Status: DC

## 2022-02-21 NOTE — Progress Notes (Signed)
PCP: Harlan Stains, MD ?Cardiology: Dr. [name] Cancel ?HF Cardiology: Dr. Aundra Dubin ? ?76 y.o. with history of CAD s/p CABG and ischemic cardiomyopathy was referred by Dr. [name] Cancel for evaluation of CHF.  Patient has a long h/o heart disease.  In 1996, she had CABG with sequential LIMA-LAD and diagonal.  Last cath in 5/21 showed totally occluded LAD with patent LIMA-LAD and diagonal, minimal disease in LCx and RCA. LV systolic function has been reduced.  She has a [employer] ICD.  Last echo in 9/22 showed EF 25-30%, low normal RV function, moderate MR.  She has a chronic LBBB.  She additionally has rheumatoid arthritis and significant low back pain for which she has a spinal stimulator.  ? ?She tried to take [facility] in the past and was very tired/fatigued on it.  She was unable to tolerate it even at low dose and stopped it.  She was lightheaded when we tried to increase Coreg to 12.5 mg bid.  ? ?Patient returns for followup of CHF.  Main complaint today is knee pain, she had a recent injection.  No chest pain.  Mild dyspnea with inclines, carrying something.  Generally no dyspnea walking on flat ground.  No chest pain.  Weight stable.  No orthopnea/PND.  ? ?ECG (personally reviewed): NSR, IVCD 136 msec ? ?Labs (11/22): K 4.9, creatinine 0.95 ?Labs (2/23): K 4.7, creatinine 1.06 ? ?St Jude ICD: Stable thoracic impedance.  ? ?PMH: ?1. HTN ?2. Hyperlipidemia ?3. GERD ?4. Gout ?5. Fe deficiency anemia ?6. CAD: CABG in 1996 with sequential LIMA-diagonal and LAD.  ?- LHC (5/21): totally occluded LAD with patent sequent LIMA-D and LAD, no significant LCx or RCA disease.  ?7. Chronic systolic CHF: Ischemic cardiomyopathy.  St Jude ICD.  ?- Echo (9/22): EF 25-30%, low normal RV function, moderate MR.  ?8. Low back pain: Arthritis, has spinal stimulator.  ?9. Chronic LBBB ?10. Rheumatoid arthritis.  ?11. COPD: Prior smoker.  ? ?Social History  ? ?Socioeconomic History  ? Marital status: Married  ?  Spouse name: Not on file  ? Number of  children: 3  ? Years of education: Not on file  ? Highest education level: Not on file  ?Occupational History  ? Not on file  ?Tobacco Use  ? Smoking status: Former  ?  Packs/day: 0.75  ?  Years: 15.00  ?  Pack years: 11.25  ?  Types: Cigarettes  ?  Quit date: 11/20/1994  ?  Years since quitting: 27.2  ? Smokeless tobacco: Never  ? Tobacco comments:  ?  Quit in 1996  ?Vaping Use  ? Vaping Use: Never used  ?Substance and Sexual Activity  ? Alcohol use: Never  ?  Alcohol/week: 0.0 standard drinks  ? Drug use: Never  ? Sexual activity: Yes  ?Other Topics Concern  ? Not on file  ?Social History Narrative  ? 2 living children, one deceased age 54  ? Right handed  ? One story home  ? Drinks coffee am  ? ?Social Determinants of Health  ? ?Financial Resource Strain: Not on file  ?Food Insecurity: Not on file  ?Transportation Needs: Not on file  ?Physical Activity: Not on file  ?Stress: Not on file  ?Social Connections: Not on file  ?Intimate Partner Violence: Not on file  ? ?Family History  ?Problem Relation Age of Onset  ? Heart attack Father   ? Heart attack Mother   ? Bladder Cancer Brother   ? Cervical cancer Daughter   ?     ?  ?  Colon cancer Neg Hx   ? ?ROS: All systems reviewed and negative except as per HPI.  ? ?Current Outpatient Medications  ?Medication Sig Dispense Refill  ? albuterol (PROVENTIL HFA;VENTOLIN HFA) 108 (90 Base) MCG/ACT inhaler Take 2 puffs 3 times a day and every 4 hours as needed.    ? allopurinol (ZYLOPRIM) 300 MG tablet Take 300 mg by mouth daily.    ? aspirin EC 81 MG EC tablet Take 1 tablet (81 mg total) by mouth daily.    ? atorvastatin (LIPITOR) 80 MG tablet Take 80 mg by mouth daily.    ? calcium carbonate (TUMS - DOSED IN MG ELEMENTAL CALCIUM) 500 MG chewable tablet Chew 1 tablet by mouth as needed for indigestion or heartburn.    ? Carboxymethylcellulose Sodium (THERATEARS OP) Place 1 drop into both eyes 3 (three) times daily as needed (dry eyes).    ? cholecalciferol (VITAMIN D3) 25 MCG  (1000 UT) tablet Take 1,000 Units by mouth daily.    ? dapagliflozin propanediol (FARXIGA) 10 MG TABS tablet Take 1 tablet (10 mg total) by mouth daily before breakfast. 30 tablet 11  ? ferrous sulfate 325 (65 FE) MG tablet Take 325 mg by mouth daily with breakfast.    ? fexofenadine (ALLEGRA) 180 MG tablet Take 180 mg by mouth daily.    ? fluticasone (FLONASE) 50 MCG/ACT nasal spray Place 2 sprays into both nostrils daily.    ? furosemide (LASIX) 40 MG tablet Take 40 mg by mouth 2 (two) times daily.     ? gabapentin (NEURONTIN) 400 MG capsule TAKE 1 CAPSULE(400 MG) BY MOUTH THREE TIMES DAILY 90 capsule 5  ? HYDROcodone-acetaminophen (NORCO) 10-325 MG tablet Take 1 tablet by mouth every 6 (six) hours as needed for moderate pain.    ? Lancets (ONETOUCH DELICA PLUS [id number]) MISC     ? leflunomide (ARAVA) 20 MG tablet Take 20 mg by mouth daily.    ? losartan (COZAAR) 100 MG tablet Take 100 mg by mouth daily.    ? MAGNESIUM-OXIDE 400 (240 Mg) MG tablet TAKE 1 TABLET(400 MG) BY MOUTH DAILY 30 tablet 2  ? meclizine (ANTIVERT) 12.5 MG tablet Take 12.5 mg by mouth 3 (three) times daily as needed for dizziness.    ? montelukast (SINGULAIR) 10 MG tablet Take 10 mg by mouth at bedtime.    ? Multiple Vitamins-Minerals (OCUVITE ADULT 50+ PO) Take 1 tablet by mouth daily.    ? nitroGLYCERIN (NITROSTAT) 0.4 MG SL tablet DISSOLVE 1 TABLET UNDER THE  TONGUE EVERY 5 MINUTES AS NEEDED FOR CHEST PAIN. MAX OF 3 TABLETS IN 15 MINUTES. CALL 911 IF PAIN  PERSISTS. 75 tablet 4  ? pantoprazole (PROTONIX) 40 MG tablet TAKE 1 TABLET BY MOUTH  TWICE DAILY BEFORE A MEAL 180 tablet 3  ? potassium chloride SA (KLOR-CON M) 20 MEQ tablet Take 1 tablet (20 mEq total) by mouth daily. 30 tablet 11  ? spironolactone (ALDACTONE) 25 MG tablet Take 1 tablet (25 mg total) by mouth daily. 90 tablet 3  ? TRELEGY ELLIPTA 200-62.5-25 MCG/ACT AEPB Inhale 1 puff into the lungs daily.    ? carvedilol (COREG) 6.25 MG tablet Take 1.5 tablets (9.375 mg total) by  mouth 2 (two) times daily. 180 tablet 3  ? ?No current facility-administered medications for this encounter.  ? ?BP 138/70   Pulse 100   Wt 60.6 kg (133 lb 9.6 oz)   SpO2 96%   BMI 24.44 kg/m?  ?General: NAD ?Neck: No JVD,  no thyromegaly or thyroid nodule.  ?Lungs: Clear to auscultation bilaterally with normal respiratory effort. ?CV: Nondisplaced PMI.  Heart regular S1/S2 with paradoxically split S2, no S3/S4, no murmur.  No peripheral edema.  No carotid bruit.  Normal pedal pulses.  ?Abdomen: Soft, nontender, no hepatosplenomegaly, no distention.  ?Skin: Intact without lesions or rashes.  ?Neurologic: Alert and oriented x 3.  ?Psych: Normal affect. ?Extremities: No clubbing or cyanosis.  ?HEENT: Normal.  ? ?Assessment/Plan: ?1. Chronic systolic CHF: Probably ischemic cardiomyopathy (h/o occluded LAD, s/p CABG). Coronaries have been unchanged since CABG in 1996. However, EF has declined over the years and I worry that another process may be present.  She has a chronic LBBB, so LBBB cardiomyopathy may play a role. Unable to obtain cardiac MRI with spinal cord stimulator. NYHA class II symptoms likely (though difficult to tell with limitation from knee pain).  She is not volume overloaded on exam or by [place].   ?- We have discussed Entresto, she does not feel like she can re-try this.  Therefore, will continue losartan 100 mg daily.  ?- Continue spironolactone 25 mg daily. BMET today.  ?- Continue Farxiga 10 mg daily.  ?- She did not tolerate increase in Coreg to 12.5 mg bid due to orthostasis.  I will increase Coreg to 9.375 mg bid.  ?- Continue losartan 100 mg daily.  ?- She is on Lasix 40 mg bid, will continue this for now.  ?- I would like her to be re-evaluated by EP for possible CRT upgrade.  She has a LBBB of borderline width (144 msec on prior ECG, 136 msec today).   ?2. CAD: CABG 1996.  Cath in 5/21 due to fall in EF showed totally occluded LAD, patent RCA and LCx, patent LIMA-LAD and diagonal.  No  change, no interventional target.  No chest pain.  ?- Continue ASA 81 daily.  ?- Continue atorvastatin 80 daily, check lipids.  ? ?Followup in 3 months with [facility].  ? ?Loralie Champagne ?02/21/2022 ? ?

## 2022-02-21 NOTE — Patient Instructions (Signed)
Medication Changes: ? ?Increase Carvedilol to 9.375 mg Twice daily (1 1/2 Tab) ? ?Lab Work: ? ?Labs done today, your results will be available in MyChart, we will contact you for abnormal readings. ? ? ?Testing/Procedures: ? ?none ? ?Referrals: ? ?none ? ?Special Instructions // Education: ? ?none ? ?Follow-Up in: 3 months  ? ?At the [facility], you and your health needs are our priority. We have a designated team specialized in the treatment of Heart Failure. This Care Team includes your primary Heart Failure Specialized Cardiologist (physician), Advanced Practice Providers (APPs- Physician Assistants and Nurse Practitioners), and Pharmacist who all work together to provide you with the care you need, when you need it.  ? ?You may see any of the following providers on your designated Care Team at your next follow up: ? ?Dr Glori Bickers ?Dr Loralie Champagne ?Darrick Grinder, NP ?Lyda Jester, PA ?Jessica Milford,NP ?Marlyce Huge, PA ?Audry Riles, PharmD ? ? ?Please be sure to bring in all your medications bottles to every appointment.  ? ?Need to Contact us: ? ?If you have any questions or concerns before your next appointment please send [place] a message through [place] or call our office at [phone].   ? ?TO LEAVE A MESSAGE FOR THE NURSE SELECT OPTION 2, PLEASE LEAVE A MESSAGE INCLUDING: ?YOUR NAME ?DATE OF BIRTH ?CALL BACK NUMBER ?REASON FOR CALL**this is important as we prioritize the call backs ? ?YOU WILL RECEIVE A CALL BACK THE SAME DAY AS LONG AS YOU CALL BEFORE 4:00 PM ? ? ?

## 2022-02-23 ENCOUNTER — Ambulatory Visit (HOSPITAL_COMMUNITY): Payer: Medicare Other | Attending: Neurological Surgery | Admitting: Physical Therapy

## 2022-02-23 ENCOUNTER — Encounter (HOSPITAL_COMMUNITY): Payer: Self-pay | Admitting: Physical Therapy

## 2022-02-23 DIAGNOSIS — M542 Cervicalgia: Secondary | ICD-10-CM | POA: Diagnosis not present

## 2022-02-23 DIAGNOSIS — R293 Abnormal posture: Secondary | ICD-10-CM | POA: Diagnosis not present

## 2022-02-23 NOTE — Patient Instructions (Signed)

## 2022-02-23 NOTE — Therapy (Signed)
?OUTPATIENT PHYSICAL THERAPY CERVICAL EVALUATION ? ? ?Patient Name: Katherine Larson ?MRN: [id number] ?DOB:[birth year], 76 y.o., female ?Today's Date: 02/23/2022 ? ? PT End of Session - 02/23/22 0940   ? ? Visit Number 1   ? Number of Visits 8   ? Date for PT Re-Evaluation 03/23/22   ? Authorization Type UHC Medicare   ? PT Start Time [phone]   ? PT Stop Time [phone]   ? PT Time Calculation (min) 40 min   ? Activity Tolerance Patient limited by pain   ? Behavior During Therapy [facility] for tasks assessed/performed   ? ?  ?  ? ?  ? ? ?Past Medical History:  ?Diagnosis Date  ? Anemia   ? anemia of chronic disease +/- IDA followed by Dr. Earlie Server. received iron infusions in the past  ? Asthma   ? Borderline glaucoma   ? CAD (coronary artery disease)   ? a. s/p CABG 1996. b. low risk nuc 2011. c. LHC 04/2016 due to drop in EF -> occluded native LAD,  widely patent sequential LIMA-D1-LAD.  ? Chronic kidney disease   ? "mild stage of kidney disease"  ? Chronic systolic CHF (congestive heart failure) ([place])   ? Complication of anesthesia   ? DM2 (diabetes mellitus, type 2) ([place])   ? not on any medicine for this at this time, 05/2016  ? Dyspnea   ? with exertion  ? GERD (gastroesophageal reflux disease)   ? Gout   ? History of blood transfusion   ? History of hiatal hernia   ? in 56s  ? History of pneumonia   ? March, 2016  ? HLD (hyperlipidemia)   ? HTN (hypertension)   ? Hyperthyroidism 01/21/2016  ? Inappropriate sinus tachycardia   ? LBBB (left bundle branch block)   ? a. Seen in 04/2016  ? 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 Macular degeneration   ? left  ? Myocardial infarction [facility])   ? 1996  ? Neuropathy   ? Neuropathy   ? NICM (nonischemic cardiomyopathy) ([place])   ? a. Dx 04/2016 - EF 25-30%, diffuse HK, elevated LVEDP, mild MR, mod LAE.  ? Normocytic anemia 10/17/2021  ? NSVT (nonsustained ventricular tachycardia) ([place])   ? PAT (paroxysmal atrial tachycardia) ([place])   ? Pneumonia   ? PONV (postoperative nausea and vomiting)   ? "after just  about every surgery I've had"  ? Rheumatoid arthritis ([place])   ? RA- hands  ? Seasonal allergies   ? ?Past Surgical History:  ?Procedure Laterality Date  ? ABDOMINAL HYSTERECTOMY    ? APPENDECTOMY    ? BACK SURGERY    ? BIOPSY  04/27/2020  ? Procedure: BIOPSY;  Surgeon: Daneil Dolin, MD;  [facility];  Service: Endoscopy;;  ascending colon biopsy  ? CARDIAC CATHETERIZATION N/A 05/01/2016  ? Procedure: Right/Left Heart Cath and Coronary/Graft Angiography;  Surgeon: Leonie Man, MD;  Location: [place] CV LAB;  Service: Cardiovascular;  Laterality: N/A;  ? CHOLECYSTECTOMY    ? COLONOSCOPY  11/2011  ? Dr. Magod:ext/int hemorrhoids, diverticulosis sigmoid colon and distal desc colon, mid-desc colon hyperplastic polyps.   ? COLONOSCOPY WITH PROPOFOL N/A 04/27/2020  ? Rourk: Diverticulosis, hemorrhoids, normal terminal ileum.  Random colon biopsies significant for chronic lymphocytic colitis  ? CORONARY ARTERY BYPASS GRAFT  1996  ? 2 vessels  ? ESOPHAGOGASTRODUODENOSCOPY  11/2011  ? Dr. Watt Climes: small hiatal hernia, one non-bleeding superficial gastric ulcer, medium-sized diverticulum in area of papilla  ?  ESOPHAGOGASTRODUODENOSCOPY N/A 12/29/2015  ? Dr. Gala Romney: Chronic inactive gastritis, normal esophagus status post dilation, duodenal diverticula  ? ESOPHAGOGASTRODUODENOSCOPY (EGD) WITH PROPOFOL N/A 07/24/2019  ? Dr. Gala Romney: Normal esophagus status post empiric dilation, small hiatal hernia, large D2 diverticulum  ? ESOPHAGOGASTRODUODENOSCOPY (EGD) WITH PROPOFOL N/A 06/03/2021  ? Surgeon: Daneil Dolin, MD; normal esophagus, normal stomach, normal duodenum.  Abnormal hypopharyngeal mucosa bilaterally just distal to the base of the tongue (right side greater than left), overlying mucosa appeared irregular and somewhat verrucous in appearance.  Recommended ENT evaluation.  ? EYE SURGERY Bilateral   ? cataract removal  ? FOOT SURGERY    ? removal bone spur  ? FRACTURE SURGERY Right   ? arm  ? GIVENS  CAPSULE STUDY  11/2012  ? Dr. Watt Climes: minimal gastritis, normal small bowel capsule  ? HERNIA REPAIR    ? hiatal hernia  ? ICD IMPLANT N/A 02/04/2021  ? Procedure: ICD IMPLANT;  Surgeon: Evans Lance, MD;  Location: [place] CV LAB;  Service: Cardiovascular;  Laterality: N/A;  ? MALONEY DILATION N/A 07/24/2019  ? Procedure: MALONEY DILATION;  Surgeon: Daneil Dolin, MD;  [facility];  Service: Endoscopy;  Laterality: N/A;  ? MALONEY DILATION N/A 06/03/2021  ? Procedure: MALONEY DILATION;  Surgeon: Daneil Dolin, MD;  [facility];  Service: Endoscopy;  Laterality: N/A;  ? MAXIMUM ACCESS (MAS)POSTERIOR LUMBAR INTERBODY FUSION (PLIF) 1 LEVEL N/A 08/14/2013  ? Procedure:  MAXIMUM ACCESS SURGERY(MAS) POSTERIOR LUMBAR INTERBODY FUSION LUMBAR THREE-FOUR ;  Surgeon: Eustace Moore, MD;  Location: [place] [facility]UR[facility] OR[facility];  Service: Neurosurgery;  Laterality: N/A;   MAXIMUM ACCESS SURGERY(MAS) POSTERIOR LUMBAR INTERBODY FUSION LUMBAR THREE-FOUR   ? PTCA    ? RIGHT/LEFT HEART CATH AND CORONARY ANGIOGRAPHY N/A 03/26/2020  ? Procedure: RIGHT/LEFT HEART CATH AND CORONARY ANGIOGRAPHY;  Surgeon: Leonie Man, MD;  Location: [place] CV LAB;  Service: Cardiovascular;  Laterality: N/A;  ? SPINAL CORD STIMULATOR BATTERY EXCHANGE N/A 01/19/2021  ? Procedure: Spinal cord stimulator battery change;  Surgeon: Reece Agar, MD;  Location: [place];  Service: Neurosurgery;  Laterality: N/A;  ? SPINAL CORD STIMULATOR INSERTION N/A 06/16/2016  ? Procedure: LUMBAR SPINAL CORD STIMULATOR INSERTION;  Surgeon: Clydell Hakim, MD;  Location: [place] NEURO ORS;  Service: Neurosurgery;  Laterality: N/A;  LUMBAR SPINAL CORD STIMULATOR INSERTION  ? tens  05/2016  ? TONSILLECTOMY    ? ?Patient Active Problem List  ? Diagnosis Date Noted  ? Hypotension 02/10/2022  ? Normocytic anemia 10/17/2021  ? Lymphocytic colitis 07/02/2020  ? Coronary artery disease, occlusive: CTO proxLAD 03/26/2020  ? Abnormal nuclear stress test 03/26/2020   ? Chronic diarrhea 03/02/2020  ? NICM- new drop in EF 25-30% 05/03/2016  ? Acute pulmonary edema (H[facility])   ? LBBB (left bundle branch block) 04/29/2016  ? Chronic combined systolic and diastolic heart failure ([place]) 04/29/2016  ? Abdominal pain, chronic, epigastric 03/07/2016  ? Sepsis ([place])   ? COPD exacerbation ([place]) 01/21/2016  ? Hyperthyroidism 01/21/2016  ? 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 Hypokalemia 01/19/2016  ? Hyponatremia 01/19/2016  ? Diet-controlled diabetes mellitus ([place]) 01/19/2016  ? CAP (community acquired pneumonia) 01/18/2016  ? Mucosal abnormality of stomach   ? Dysphagia   ? Constipation 12/08/2015  ? Gastroesophageal reflux disease 12/08/2015  ? Abnormal CT scan, esophagus 12/08/2015  ? Esophageal dysphagia 12/08/2015  ? Abdominal pain, epigastric 12/08/2015  ? Loss of weight 12/08/2015  ? Lower abdominal pain 12/08/2015  ? Back  pain 08/26/2015  ? Headache 08/26/2015  ? Essential hypertension 08/26/2015  ? DM type 2 (diabetes mellitus, type 2) ([place]) 08/26/2015  ? Sinus tachycardia 06/07/2015  ? Diarrhea 06/07/2015  ? Preop cardiovascular exam 07/16/2013  ? Atrial flutter ([place]) 07/16/2013  ? Anemia 01/08/2013  ? Leukocytosis 12/17/2012  ? DYSPNEA 02/02/2010  ? DIABETES MELLITUS 07/28/2009  ? Hyperlipidemia 07/28/2009  ? CHOLECYSTECTOMY, HX OF 07/28/2009  ? Hx of CABG '96- cath 05/01/16 07/28/2009  ? HERNIORRHAPHY, HX OF 07/28/2009  ? ? ?PCP: Harlan Stains, MD ? ?REFERRING PROVIDER: Eustace Moore, MD ? ?REFERRING DIAG: M54.2 cervicalgia (dry needling)  ? ?THERAPY DIAG:  ?Cervicalgia ? ?Abnormal posture ? ?ONSET DATE: > 1 year  ? ?SUBJECTIVE:                                                                                                                                                                                                        ? ?SUBJECTIVE STATEMENT: ?Patient presents to therapy with complaint of ongoing neck pain. Pain is chronic but has progressively worsened in the last year. She  had recent CT of neck which showed severe degeneration and stenosis. She is scheduled for injections in neck this month. She take pain medication to manage symptoms. She may be surgical candiate per her report

## 2022-02-24 ENCOUNTER — Telehealth (HOSPITAL_COMMUNITY): Payer: Self-pay

## 2022-02-24 DIAGNOSIS — E782 Mixed hyperlipidemia: Secondary | ICD-10-CM

## 2022-02-24 MED ORDER — ICOSAPENT ETHYL 1 G PO CAPS: 2.0000 g | ORAL_CAPSULE | Freq: Two times a day (BID) | ORAL | 10 refills | Status: DC

## 2022-02-24 NOTE — Telephone Encounter (Addendum)
Pt aware, agreeable, and verbalized understanding ? ?Med list updated. Lab order mailed to patient  ? ? ?----- Message from Larey Dresser, MD sent at 02/21/2022  3:39 PM EDT ----- ?Triglycerides high.  With history of CAD, would suggest Vascepa 2 g bid with lipid check in 2 months.  ?

## 2022-02-28 ENCOUNTER — Encounter (HOSPITAL_COMMUNITY): Payer: Self-pay | Admitting: Physical Therapy

## 2022-02-28 ENCOUNTER — Ambulatory Visit (HOSPITAL_COMMUNITY): Payer: Medicare Other | Admitting: Physical Therapy

## 2022-02-28 DIAGNOSIS — M542 Cervicalgia: Secondary | ICD-10-CM

## 2022-02-28 DIAGNOSIS — R293 Abnormal posture: Secondary | ICD-10-CM | POA: Diagnosis not present

## 2022-02-28 NOTE — Therapy (Signed)
?OUTPATIENT PHYSICAL THERAPY TREATMENT NOTE ? ? ?Patient Name: Katherine Larson ?MRN: [id number] ?DOB:10-05-1946, 76 y.o., female ?Today's Date: 02/28/2022 ? ?PCP: Harlan Stains, MD ?REFERRING PROVIDER: Harlan Stains, MD ? ?END OF SESSION:  ? PT End of Session - 02/28/22 1308   ? ? Visit Number 2   ? Number of Visits 8   ? Date for PT Re-Evaluation 03/23/22   ? Authorization Type UHC Medicare   ? PT Start Time 9379   ? PT Stop Time 0240   ? PT Time Calculation (min) 38 min   ? Activity Tolerance Patient tolerated treatment well   ? Behavior During Therapy [facility] for tasks assessed/performed   ? ?  ?  ? ?  ? ? ?Past Medical History:  ?Diagnosis Date  ? Anemia   ? anemia of chronic disease +/- IDA followed by Dr. Earlie Server. received iron infusions in the past  ? Asthma   ? Borderline glaucoma   ? CAD (coronary artery disease)   ? a. s/p CABG 1996. b. low risk nuc 2011. c. LHC 04/2016 due to drop in EF -> occluded native LAD,  widely patent sequential LIMA-D1-LAD.  ? Chronic kidney disease   ? "mild stage of kidney disease"  ? Chronic systolic CHF (congestive heart failure) ([place])   ? Complication of anesthesia   ? DM2 (diabetes mellitus, type 2) ([place])   ? not on any medicine for this at this time, 05/2016  ? Dyspnea   ? with exertion  ? GERD (gastroesophageal reflux disease)   ? Gout   ? History of blood transfusion   ? History of hiatal hernia   ? in 70s  ? History of pneumonia   ? March, 2016  ? HLD (hyperlipidemia)   ? HTN (hypertension)   ? Hyperthyroidism 01/21/2016  ? Inappropriate sinus tachycardia   ? LBBB (left bundle branch block)   ? a. Seen in 04/2016  ? 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 Macular degeneration   ? left  ? Myocardial infarction [facility])   ? 1996  ? Neuropathy   ? Neuropathy   ? NICM (nonischemic cardiomyopathy) ([place])   ? a. Dx 04/2016 - EF 25-30%, diffuse HK, elevated LVEDP, mild MR, mod LAE.  ? Normocytic anemia 10/17/2021  ? NSVT (nonsustained ventricular tachycardia) ([place])   ? PAT (paroxysmal atrial  tachycardia) ([place])   ? Pneumonia   ? PONV (postoperative nausea and vomiting)   ? "after just about every surgery I've had"  ? Rheumatoid arthritis ([place])   ? RA- hands  ? Seasonal allergies   ? ?Past Surgical History:  ?Procedure Laterality Date  ? ABDOMINAL HYSTERECTOMY    ? APPENDECTOMY    ? BACK SURGERY    ? BIOPSY  04/27/2020  ? Procedure: BIOPSY;  Surgeon: Daneil Dolin, MD;  [facility];  Service: Endoscopy;;  ascending colon biopsy  ? CARDIAC CATHETERIZATION N/A 05/01/2016  ? Procedure: Right/Left Heart Cath and Coronary/Graft Angiography;  Surgeon: Leonie Man, MD;  Location: [place] CV LAB;  Service: Cardiovascular;  Laterality: N/A;  ? CHOLECYSTECTOMY    ? COLONOSCOPY  11/2011  ? Dr. Magod:ext/int hemorrhoids, diverticulosis sigmoid colon and distal desc colon, mid-desc colon hyperplastic polyps.   ? COLONOSCOPY WITH PROPOFOL N/A 04/27/2020  ? Rourk: Diverticulosis, hemorrhoids, normal terminal ileum.  Random colon biopsies significant for chronic lymphocytic colitis  ? CORONARY ARTERY BYPASS GRAFT  1996  ? 2 vessels  ? ESOPHAGOGASTRODUODENOSCOPY  11/2011  ? Dr. Watt Climes: small  hiatal hernia, one non-bleeding superficial gastric ulcer, medium-sized diverticulum in area of papilla  ? ESOPHAGOGASTRODUODENOSCOPY N/A 12/29/2015  ? Dr. Gala Romney: Chronic inactive gastritis, normal esophagus status post dilation, duodenal diverticula  ? ESOPHAGOGASTRODUODENOSCOPY (EGD) WITH PROPOFOL N/A 07/24/2019  ? Dr. Gala Romney: Normal esophagus status post empiric dilation, small hiatal hernia, large D2 diverticulum  ? ESOPHAGOGASTRODUODENOSCOPY (EGD) WITH PROPOFOL N/A 06/03/2021  ? Surgeon: Daneil Dolin, MD; normal esophagus, normal stomach, normal duodenum.  Abnormal hypopharyngeal mucosa bilaterally just distal to the base of the tongue (right side greater than left), overlying mucosa appeared irregular and somewhat verrucous in appearance.  Recommended ENT evaluation.  ? EYE SURGERY Bilateral   ? cataract  removal  ? FOOT SURGERY    ? removal bone spur  ? FRACTURE SURGERY Right   ? arm  ? GIVENS CAPSULE STUDY  11/2012  ? Dr. Watt Climes: minimal gastritis, normal small bowel capsule  ? HERNIA REPAIR    ? hiatal hernia  ? ICD IMPLANT N/A 02/04/2021  ? Procedure: ICD IMPLANT;  Surgeon: Evans Lance, MD;  Location: [place] CV LAB;  Service: Cardiovascular;  Laterality: N/A;  ? MALONEY DILATION N/A 07/24/2019  ? Procedure: MALONEY DILATION;  Surgeon: Daneil Dolin, MD;  [facility];  Service: Endoscopy;  Laterality: N/A;  ? MALONEY DILATION N/A 06/03/2021  ? Procedure: MALONEY DILATION;  Surgeon: Daneil Dolin, MD;  [facility];  Service: Endoscopy;  Laterality: N/A;  ? MAXIMUM ACCESS (MAS)POSTERIOR LUMBAR INTERBODY FUSION (PLIF) 1 LEVEL N/A 08/14/2013  ? Procedure:  MAXIMUM ACCESS SURGERY(MAS) POSTERIOR LUMBAR INTERBODY FUSION LUMBAR THREE-FOUR ;  Surgeon: Eustace Moore, MD;  Location: [place] NEUR[facility] ORS;  Service: Neurosurgery;  Laterality: N/A;   MAXIMUM ACCESS SURGERY(MAS) POSTERIOR LUMBAR INTERBODY FUSION LUMBAR THREE-FOUR   ? PTCA    ? RIGHT/LEFT HEART CATH AND CORONARY ANGIOGRAPHY N/A 03/26/2020  ? Procedure: RIGHT/LEFT HEART CATH AND CORONARY ANGIOGRAPHY;  Surgeon: Leonie Man, MD;  Location: [place] CV LAB;  Service: Cardiovascular;  Laterality: N/A;  ? SPINAL CORD STIMULATOR BATTERY EXCHANGE N/A 01/19/2021  ? Procedure: Spinal cord stimulator battery change;  Surgeon: Reece Agar, MD;  Location: [place];  Service: Neurosurgery;  Laterality: N/A;  ? SPINAL CORD STIMULATOR INSERTION N/A 06/16/2016  ? Procedure: LUMBAR SPINAL CORD STIMULATOR INSERTION;  Surgeon: Clydell Hakim, MD;  Location: [place] NEURO ORS;  Service: Neurosurgery;  Laterality: N/A;  LUMBAR SPINAL CORD STIMULATOR INSERTION  ? tens  05/2016  ? TONSILLECTOMY    ? ?Patient Active Problem List  ? Diagnosis Date Noted  ? Hypotension 02/10/2022  ? Normocytic anemia 10/17/2021  ? Lymphocytic colitis 07/02/2020  ? Coronary  artery disease, occlusive: CTO proxLAD 03/26/2020  ? Abnormal nuclear stress test 03/26/2020  ? Chronic diarrhea 03/02/2020  ? NICM- new drop in EF 25-30% 05/03/2016  ? Acute pulmonary edema (H[facility])   ? LBBB (left bundle branch block) 04/29/2016  ? Chronic combined systolic and diastolic heart failure ([place]) 04/29/2016  ? Abdominal pain, chronic, epigastric 03/07/2016  ? Sepsis ([place])   ? COPD exacerbation ([place]) 01/21/2016  ? Hyperthyroidism 01/21/2016  ? 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 Hypokalemia 01/19/2016  ? Hyponatremia 01/19/2016  ? Diet-controlled diabetes mellitus ([place]) 01/19/2016  ? CAP (community acquired pneumonia) 01/18/2016  ? Mucosal abnormality of stomach   ? Dysphagia   ? Constipation 12/08/2015  ? Gastroesophageal reflux disease 12/08/2015  ? Abnormal CT scan, esophagus 12/08/2015  ? Esophageal dysphagia 12/08/2015  ? Abdominal pain, epigastric 12/08/2015  ?  Loss of weight 12/08/2015  ? Lower abdominal pain 12/08/2015  ? Back pain 08/26/2015  ? Headache 08/26/2015  ? Essential hypertension 08/26/2015  ? DM type 2 (diabetes mellitus, type 2) ([place]) 08/26/2015  ? Sinus tachycardia 06/07/2015  ? Diarrhea 06/07/2015  ? Preop cardiovascular exam 07/16/2013  ? Atrial flutter ([place]) 07/16/2013  ? Anemia 01/08/2013  ? Leukocytosis 12/17/2012  ? DYSPNEA 02/02/2010  ? DIABETES MELLITUS 07/28/2009  ? Hyperlipidemia 07/28/2009  ? CHOLECYSTECTOMY, HX OF 07/28/2009  ? Hx of CABG '96- cath 05/01/16 07/28/2009  ? HERNIORRHAPHY, HX OF 07/28/2009  ? ? ?REFERRING DIAG: M54.2 cervicalgia (dry needling)  ? ?THERAPY DIAG:  ?Cervicalgia ? ?Abnormal posture ? ?PERTINENT HISTORY: CABG, DM, HTN, arthritis, bilateral knee pain, balance issues, lumbar pain stimulator  ? ?PRECAUTIONS: cardiac, lumbar stimulator ? ?SUBJECTIVE: Still in pain. "Hurts bad today". Doing HEP and it hurts.  ? ?PAIN:  ?Are you having pain? Yes: NPRS scale: 6/10 ?Pain location: neck  ?Pain description: sharp pain  ?Aggravating factors: movement, looking down   ?Relieving factors: meds  ? ?  ?OBJECTIVE:  ?  ?DIAGNOSTIC FINDINGS:  ?IMPRESSION: ?1. 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 ?2. Mild-to-moderate spinal stenosis and moderate bilateral neur

## 2022-03-01 DIAGNOSIS — M25562 Pain in left knee: Secondary | ICD-10-CM | POA: Diagnosis not present

## 2022-03-01 DIAGNOSIS — M25461 Effusion, right knee: Secondary | ICD-10-CM | POA: Diagnosis not present

## 2022-03-01 DIAGNOSIS — M25561 Pain in right knee: Secondary | ICD-10-CM | POA: Diagnosis not present

## 2022-03-07 ENCOUNTER — Other Ambulatory Visit: Payer: Self-pay | Admitting: Cardiology

## 2022-03-08 DIAGNOSIS — M47812 Spondylosis without myelopathy or radiculopathy, cervical region: Secondary | ICD-10-CM | POA: Diagnosis not present

## 2022-03-09 DIAGNOSIS — M25561 Pain in right knee: Secondary | ICD-10-CM | POA: Diagnosis not present

## 2022-03-17 DIAGNOSIS — Z79899 Other long term (current) drug therapy: Secondary | ICD-10-CM | POA: Diagnosis not present

## 2022-03-17 DIAGNOSIS — D500 Iron deficiency anemia secondary to blood loss (chronic): Secondary | ICD-10-CM | POA: Diagnosis not present

## 2022-03-17 DIAGNOSIS — M1009 Idiopathic gout, multiple sites: Secondary | ICD-10-CM | POA: Diagnosis not present

## 2022-03-17 DIAGNOSIS — M1991 Primary osteoarthritis, unspecified site: Secondary | ICD-10-CM | POA: Diagnosis not present

## 2022-03-17 DIAGNOSIS — M0579 Rheumatoid arthritis with rheumatoid factor of multiple sites without organ or systems involvement: Secondary | ICD-10-CM | POA: Diagnosis not present

## 2022-03-20 ENCOUNTER — Ambulatory Visit
Admission: RE | Admit: 2022-03-20 | Discharge: 2022-03-20 | Disposition: A | Discharge status: Home / Self Care | Payer: Medicare Other | Source: Ambulatory Visit | Attending: Family Medicine | Admitting: Family Medicine

## 2022-03-20 DIAGNOSIS — R911 Solitary pulmonary nodule: Secondary | ICD-10-CM

## 2022-03-21 ENCOUNTER — Telehealth (HOSPITAL_COMMUNITY): Payer: Self-pay

## 2022-03-21 NOTE — Telephone Encounter (Signed)
Received a fax requesting medical records from [facility]. Records were successfully faxed to: [phone] ,which was the number provided.. Medical request form will be scanned into patients chart.  ? ? ?

## 2022-03-22 ENCOUNTER — Ambulatory Visit (HOSPITAL_COMMUNITY): Payer: Medicare Other | Attending: Neurological Surgery | Admitting: Physical Therapy

## 2022-03-22 ENCOUNTER — Encounter (HOSPITAL_COMMUNITY): Payer: Self-pay | Admitting: Physical Therapy

## 2022-03-22 DIAGNOSIS — M542 Cervicalgia: Secondary | ICD-10-CM | POA: Insufficient documentation

## 2022-03-22 DIAGNOSIS — R293 Abnormal posture: Secondary | ICD-10-CM | POA: Diagnosis not present

## 2022-03-22 NOTE — Therapy (Signed)
?OUTPATIENT PHYSICAL THERAPY TREATMENT NOTE ? ? ?Patient Name: Katherine Larson ?MRN: [id number] ?DOB:[birth year], 76 y.o., female ?Today's Date: 03/22/2022 ? ?PCP: Harlan Stains, MD ?REFERRING PROVIDER: Eustace Moore, MD ?\ ?Progress Note  ? ?Reporting Period 02/23/22 to 03/22/22 ? ? See note below for Objective Data and Assessment of Progress/Goals  ? ?END OF SESSION:  ? PT End of Session - 03/22/22 1359   ? ? Visit Number 3   ? Number of Visits 16   ? Date for PT Re-Evaluation 04/19/22   ? Authorization Type UHC Medicare   ? PT Start Time 1400   ? PT Stop Time 1025   ? PT Time Calculation (min) 44 min   ? Activity Tolerance Patient tolerated treatment well   ? Behavior During Therapy [facility] for tasks assessed/performed   ? ?  ?  ? ?  ? ? ?Past Medical History:  ?Diagnosis Date  ? Anemia   ? anemia of chronic disease +/- IDA followed by Dr. Earlie Server. received iron infusions in the past  ? Asthma   ? Borderline glaucoma   ? CAD (coronary artery disease)   ? a. s/p CABG 1996. b. low risk nuc 2011. c. LHC 04/2016 due to drop in EF -> occluded native LAD,  widely patent sequential LIMA-D1-LAD.  ? Chronic kidney disease   ? "mild stage of kidney disease"  ? Chronic systolic CHF (congestive heart failure) ([place])   ? Complication of anesthesia   ? DM2 (diabetes mellitus, type 2) ([place])   ? not on any medicine for this at this time, 05/2016  ? Dyspnea   ? with exertion  ? GERD (gastroesophageal reflux disease)   ? Gout   ? History of blood transfusion   ? History of hiatal hernia   ? in 31s  ? History of pneumonia   ? March, 2016  ? HLD (hyperlipidemia)   ? HTN (hypertension)   ? Hyperthyroidism 01/21/2016  ? Inappropriate sinus tachycardia   ? LBBB (left bundle branch block)   ? a. Seen in 04/2016  ? 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 Macular degeneration   ? left  ? Myocardial infarction [facility])   ? 1996  ? Neuropathy   ? Neuropathy   ? NICM (nonischemic cardiomyopathy) ([place])   ? a. Dx 04/2016 - EF 25-30%, diffuse HK, elevated LVEDP, mild MR,  mod LAE.  ? Normocytic anemia 10/17/2021  ? NSVT (nonsustained ventricular tachycardia) ([place])   ? PAT (paroxysmal atrial tachycardia) ([place])   ? Pneumonia   ? PONV (postoperative nausea and vomiting)   ? "after just about every surgery I've had"  ? Rheumatoid arthritis ([place])   ? RA- hands  ? Seasonal allergies   ? ?Past Surgical History:  ?Procedure Laterality Date  ? ABDOMINAL HYSTERECTOMY    ? APPENDECTOMY    ? BACK SURGERY    ? BIOPSY  04/27/2020  ? Procedure: BIOPSY;  Surgeon: Daneil Dolin, MD;  [facility];  Service: Endoscopy;;  ascending colon biopsy  ? CARDIAC CATHETERIZATION N/A 05/01/2016  ? Procedure: Right/Left Heart Cath and Coronary/Graft Angiography;  Surgeon: Leonie Man, MD;  Location: [place] CV LAB;  Service: Cardiovascular;  Laterality: N/A;  ? CHOLECYSTECTOMY    ? COLONOSCOPY  11/2011  ? Dr. Magod:ext/int hemorrhoids, diverticulosis sigmoid colon and distal desc colon, mid-desc colon hyperplastic polyps.   ? COLONOSCOPY WITH PROPOFOL N/A 04/27/2020  ? Rourk: Diverticulosis, hemorrhoids, normal terminal ileum.  Random colon biopsies significant for chronic  lymphocytic colitis  ? CORONARY ARTERY BYPASS GRAFT  1996  ? 2 vessels  ? ESOPHAGOGASTRODUODENOSCOPY  11/2011  ? Dr. Watt Climes: small hiatal hernia, one non-bleeding superficial gastric ulcer, medium-sized diverticulum in area of papilla  ? ESOPHAGOGASTRODUODENOSCOPY N/A 12/29/2015  ? Dr. Gala Romney: Chronic inactive gastritis, normal esophagus status post dilation, duodenal diverticula  ? ESOPHAGOGASTRODUODENOSCOPY (EGD) WITH PROPOFOL N/A 07/24/2019  ? Dr. Gala Romney: Normal esophagus status post empiric dilation, small hiatal hernia, large D2 diverticulum  ? ESOPHAGOGASTRODUODENOSCOPY (EGD) WITH PROPOFOL N/A 06/03/2021  ? Surgeon: Daneil Dolin, MD; normal esophagus, normal stomach, normal duodenum.  Abnormal hypopharyngeal mucosa bilaterally just distal to the base of the tongue (right side greater than left), overlying mucosa  appeared irregular and somewhat verrucous in appearance.  Recommended ENT evaluation.  ? EYE SURGERY Bilateral   ? cataract removal  ? FOOT SURGERY    ? removal bone spur  ? FRACTURE SURGERY Right   ? arm  ? GIVENS CAPSULE STUDY  11/2012  ? Dr. Watt Climes: minimal gastritis, normal small bowel capsule  ? HERNIA REPAIR    ? hiatal hernia  ? ICD IMPLANT N/A 02/04/2021  ? Procedure: ICD IMPLANT;  Surgeon: Evans Lance, MD;  Location: [place] CV LAB;  Service: Cardiovascular;  Laterality: N/A;  ? MALONEY DILATION N/A 07/24/2019  ? Procedure: MALONEY DILATION;  Surgeon: Daneil Dolin, MD;  [facility];  Service: Endoscopy;  Laterality: N/A;  ? MALONEY DILATION N/A 06/03/2021  ? Procedure: MALONEY DILATION;  Surgeon: Daneil Dolin, MD;  [facility];  Service: Endoscopy;  Laterality: N/A;  ? MAXIMUM ACCESS (MAS)POSTERIOR LUMBAR INTERBODY FUSION (PLIF) 1 LEVEL N/A 08/14/2013  ? Procedure:  MAXIMUM ACCESS SURGERY(MAS) POSTERIOR LUMBAR INTERBODY FUSION LUMBAR THREE-FOUR ;  Surgeon: Eustace Moore, MD;  Location: [place] NEURO ORS;  Service: Neurosurgery;  Laterality: N/A;   MAXIMUM ACCESS SURGERY(MAS) POSTERIOR LUMBAR INTERBODY FUSION LUMBAR THREE-FOUR   ? PTCA    ? RIGHT/LEFT HEART CATH AND CORONARY ANGIOGRAPHY N/A 03/26/2020  ? Procedure: RIGHT/LEFT HEART CATH AND CORONARY ANGIOGRAPHY;  Surgeon: Leonie Man, MD;  Location: [place] CV LAB;  Service: Cardiovascular;  Laterality: N/A;  ? SPINAL CORD STIMULATOR BATTERY EXCHANGE N/A 01/19/2021  ? Procedure: Spinal cord stimulator battery change;  Surgeon: Reece Agar, MD;  Location: [place];  Service: Neurosurgery;  Laterality: N/A;  ? SPINAL CORD STIMULATOR INSERTION N/A 06/16/2016  ? Procedure: LUMBAR SPINAL CORD STIMULATOR INSERTION;  Surgeon: Clydell Hakim, MD;  Location: [place] NEURO ORS;  Service: Neurosurgery;  Laterality: N/A;  LUMBAR SPINAL CORD STIMULATOR INSERTION  ? tens  05/2016  ? TONSILLECTOMY    ? ?Patient Active Problem List  ?  Diagnosis Date Noted  ? Hypotension 02/10/2022  ? Normocytic anemia 10/17/2021  ? Lymphocytic colitis 07/02/2020  ? Coronary artery disease, occlusive: CTO proxLAD 03/26/2020  ? Abnormal nuclear stress test 03/26/2020  ? Chronic diarrhea 03/02/2020  ? NICM- new drop in EF 25-30% 05/03/2016  ? Acute pulmonary edema (H[facility])   ? LBBB (left bundle branch block) 04/29/2016  ? Chronic combined systolic and diastolic heart failure ([place]) 04/29/2016  ? Abdominal pain, chronic, epigastric 03/07/2016  ? Sepsis ([place])   ? COPD exacerbation ([place]) 01/21/2016  ? Hyperthyroidism 01/21/2016  ? 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 Hypokalemia 01/19/2016  ? Hyponatremia 01/19/2016  ? Diet-controlled diabetes mellitus ([place]) 01/19/2016  ? CAP (community acquired pneumonia) 01/18/2016  ? Mucosal abnormality of stomach   ? Dysphagia   ? Constipation 12/08/2015  ?  Gastroesophageal reflux disease 12/08/2015  ? Abnormal CT scan, esophagus 12/08/2015  ? Esophageal dysphagia 12/08/2015  ? Abdominal pain, epigastric 12/08/2015  ? Loss of weight 12/08/2015  ? Lower abdominal pain 12/08/2015  ? Back pain 08/26/2015  ? Headache 08/26/2015  ? Essential hypertension 08/26/2015  ? DM type 2 (diabetes mellitus, type 2) ([place]) 08/26/2015  ? Sinus tachycardia 06/07/2015  ? Diarrhea 06/07/2015  ? Preop cardiovascular exam 07/16/2013  ? Atrial flutter ([place]) 07/16/2013  ? Anemia 01/08/2013  ? Leukocytosis 12/17/2012  ? DYSPNEA 02/02/2010  ? DIABETES MELLITUS 07/28/2009  ? Hyperlipidemia 07/28/2009  ? CHOLECYSTECTOMY, HX OF 07/28/2009  ? Hx of CABG '96- cath 05/01/16 07/28/2009  ? HERNIORRHAPHY, HX OF 07/28/2009  ? ? ?REFERRING DIAG: M54.2 cervicalgia (dry needling)  ? ?THERAPY DIAG:  ?Cervicalgia ? ?Abnormal posture ? ?PERTINENT HISTORY: CABG, DM, HTN, arthritis, bilateral knee pain, balance issues, lumbar pain stimulator  ? ?PRECAUTIONS: cardiac, lumbar stimulator ? ?SUBJECTIVE: Patient states neck is better. But certain times its worse. Colder weather seems to  make it worse. Had an injection in neck and its made slow progress. Wants to keeping coming to therapy since she hasnt been able to come much due to no openings. ? ?PAIN:  ?Are you having pain? Yes: NPRS scal

## 2022-03-24 ENCOUNTER — Encounter (HOSPITAL_COMMUNITY): Payer: Self-pay | Admitting: Physical Therapy

## 2022-03-24 ENCOUNTER — Ambulatory Visit (HOSPITAL_COMMUNITY): Payer: Medicare Other | Admitting: Physical Therapy

## 2022-03-24 DIAGNOSIS — R293 Abnormal posture: Secondary | ICD-10-CM

## 2022-03-24 DIAGNOSIS — M542 Cervicalgia: Secondary | ICD-10-CM | POA: Diagnosis not present

## 2022-03-24 NOTE — Therapy (Signed)
?OUTPATIENT PHYSICAL THERAPY TREATMENT NOTE ? ? ?Patient Name: Katherine Larson ?MRN: [id number] ?DOB:07-Jan-1946, 76 y.o., female ?Today's Date: 03/24/2022 ? ?PCP: Harlan Stains, MD ?REFERRING PROVIDER: Eustace Moore, MD ? ? ?Reporting Period 02/23/22 to 03/22/22 ? ? See note below for Objective Data and Assessment of Progress/Goals  ? ?END OF SESSION:  ? PT End of Session - 03/24/22 0908   ? ? Visit Number 4   ? Number of Visits 16   ? Date for PT Re-Evaluation 04/19/22   ? Authorization Type UHC Medicare   ? PT Start Time [phone]   ? PT Stop Time 0944   ? PT Time Calculation (min) 38 min   ? Activity Tolerance Patient tolerated treatment well   ? Behavior During Therapy [facility] for tasks assessed/performed   ? ?  ?  ? ?  ? ? ?Past Medical History:  ?Diagnosis Date  ? Anemia   ? anemia of chronic disease +/- IDA followed by Dr. Earlie Server. received iron infusions in the past  ? Asthma   ? Borderline glaucoma   ? CAD (coronary artery disease)   ? a. s/p CABG 1996. b. low risk nuc 2011. c. LHC 04/2016 due to drop in EF -> occluded native LAD,  widely patent sequential LIMA-D1-LAD.  ? Chronic kidney disease   ? "mild stage of kidney disease"  ? Chronic systolic CHF (congestive heart failure) ([place])   ? Complication of anesthesia   ? DM2 (diabetes mellitus, type 2) ([place])   ? not on any medicine for this at this time, 05/2016  ? Dyspnea   ? with exertion  ? GERD (gastroesophageal reflux disease)   ? Gout   ? History of blood transfusion   ? History of hiatal hernia   ? in 5s  ? History of pneumonia   ? March, 2016  ? HLD (hyperlipidemia)   ? HTN (hypertension)   ? Hyperthyroidism 01/21/2016  ? Inappropriate sinus tachycardia   ? LBBB (left bundle branch block)   ? a. Seen in 04/2016  ? 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 Macular degeneration   ? left  ? Myocardial infarction [facility])   ? 1996  ? Neuropathy   ? Neuropathy   ? NICM (nonischemic cardiomyopathy) ([place])   ? a. Dx 04/2016 - EF 25-30%, diffuse HK, elevated LVEDP, mild MR, mod LAE.  ?  Normocytic anemia 10/17/2021  ? NSVT (nonsustained ventricular tachycardia) ([place])   ? PAT (paroxysmal atrial tachycardia) ([place])   ? Pneumonia   ? PONV (postoperative nausea and vomiting)   ? "after just about every surgery I've had"  ? Rheumatoid arthritis ([place])   ? RA- hands  ? Seasonal allergies   ? ?Past Surgical History:  ?Procedure Laterality Date  ? ABDOMINAL HYSTERECTOMY    ? APPENDECTOMY    ? BACK SURGERY    ? BIOPSY  04/27/2020  ? Procedure: BIOPSY;  Surgeon: Daneil Dolin, MD;  [facility];  Service: Endoscopy;;  ascending colon biopsy  ? CARDIAC CATHETERIZATION N/A 05/01/2016  ? Procedure: Right/Left Heart Cath and Coronary/Graft Angiography;  Surgeon: Leonie Man, MD;  Location: [place] CV LAB;  Service: Cardiovascular;  Laterality: N/A;  ? CHOLECYSTECTOMY    ? COLONOSCOPY  11/2011  ? Dr. Magod:ext/int hemorrhoids, diverticulosis sigmoid colon and distal desc colon, mid-desc colon hyperplastic polyps.   ? COLONOSCOPY WITH PROPOFOL N/A 04/27/2020  ? Rourk: Diverticulosis, hemorrhoids, normal terminal ileum.  Random colon biopsies significant for chronic lymphocytic colitis  ?  CORONARY ARTERY BYPASS GRAFT  1996  ? 2 vessels  ? ESOPHAGOGASTRODUODENOSCOPY  11/2011  ? Dr. Watt Climes: small hiatal hernia, one non-bleeding superficial gastric ulcer, medium-sized diverticulum in area of papilla  ? ESOPHAGOGASTRODUODENOSCOPY N/A 12/29/2015  ? Dr. Gala Romney: Chronic inactive gastritis, normal esophagus status post dilation, duodenal diverticula  ? ESOPHAGOGASTRODUODENOSCOPY (EGD) WITH PROPOFOL N/A 07/24/2019  ? Dr. Gala Romney: Normal esophagus status post empiric dilation, small hiatal hernia, large D2 diverticulum  ? ESOPHAGOGASTRODUODENOSCOPY (EGD) WITH PROPOFOL N/A 06/03/2021  ? Surgeon: Daneil Dolin, MD; normal esophagus, normal stomach, normal duodenum.  Abnormal hypopharyngeal mucosa bilaterally just distal to the base of the tongue (right side greater than left), overlying mucosa appeared  irregular and somewhat verrucous in appearance.  Recommended ENT evaluation.  ? EYE SURGERY Bilateral   ? cataract removal  ? FOOT SURGERY    ? removal bone spur  ? FRACTURE SURGERY Right   ? arm  ? GIVENS CAPSULE STUDY  11/2012  ? Dr. Watt Climes: minimal gastritis, normal small bowel capsule  ? HERNIA REPAIR    ? hiatal hernia  ? ICD IMPLANT N/A 02/04/2021  ? Procedure: ICD IMPLANT;  Surgeon: Evans Lance, MD;  Location: [place] CV LAB;  Service: Cardiovascular;  Laterality: N/A;  ? MALONEY DILATION N/A 07/24/2019  ? Procedure: MALONEY DILATION;  Surgeon: Daneil Dolin, MD;  [facility];  Service: Endoscopy;  Laterality: N/A;  ? MALONEY DILATION N/A 06/03/2021  ? Procedure: MALONEY DILATION;  Surgeon: Daneil Dolin, MD;  [facility];  Service: Endoscopy;  Laterality: N/A;  ? MAXIMUM ACCESS (MAS)POSTERIOR LUMBAR INTERBODY FUSION (PLIF) 1 LEVEL N/A 08/14/2013  ? Procedure:  MAXIMUM ACCESS SURGERY(MAS) POSTERIOR LUMBAR INTERBODY FUSION LUMBAR THREE-FOUR ;  Surgeon: Eustace Moore, MD;  Location: [place] NEURO ORS;  Service: Neurosurgery;  Laterality: N/A;   MAXIMUM ACCESS SURGERY(MAS) POSTERIOR LUMBAR INTERBODY FUSION LUMBAR THREE-FOUR   ? PTCA    ? RIGHT/LEFT HEART CATH AND CORONARY ANGIOGRAPHY N/A 03/26/2020  ? Procedure: RIGHT/LEFT HEART CATH AND CORONARY ANGIOGRAPHY;  Surgeon: Leonie Man, MD;  Location: [place] CV LAB;  Service: Cardiovascular;  Laterality: N/A;  ? SPINAL CORD STIMULATOR BATTERY EXCHANGE N/A 01/19/2021  ? Procedure: Spinal cord stimulator battery change;  Surgeon: Reece Agar, MD;  Location: [place];  Service: Neurosurgery;  Laterality: N/A;  ? SPINAL CORD STIMULATOR INSERTION N/A 06/16/2016  ? Procedure: LUMBAR SPINAL CORD STIMULATOR INSERTION;  Surgeon: Clydell Hakim, MD;  Location: [place] NEURO ORS;  Service: Neurosurgery;  Laterality: N/A;  LUMBAR SPINAL CORD STIMULATOR INSERTION  ? tens  05/2016  ? TONSILLECTOMY    ? ?Patient Active Problem List  ? Diagnosis  Date Noted  ? Hypotension 02/10/2022  ? Normocytic anemia 10/17/2021  ? Lymphocytic colitis 07/02/2020  ? Coronary artery disease, occlusive: CTO proxLAD 03/26/2020  ? Abnormal nuclear stress test 03/26/2020  ? Chronic diarrhea 03/02/2020  ? NICM- new drop in EF 25-30% 05/03/2016  ? Acute pulmonary edema (HCC)   ? LBBB (left bundle branch block) 04/29/2016  ? Chronic combined systolic and diastolic heart failure ([place]) 04/29/2016  ? Abdominal pain, chronic, epigastric 03/07/2016  ? Sepsis ([place])   ? COPD exacerbation ([place]) 01/21/2016  ? Hyperthyroidism 01/21/2016  ? 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 Hypokalemia 01/19/2016  ? Hyponatremia 01/19/2016  ? Diet-controlled diabetes mellitus ([place]) 01/19/2016  ? CAP (community acquired pneumonia) 01/18/2016  ? Mucosal abnormality of stomach   ? Dysphagia   ? Constipation 12/08/2015  ? Gastroesophageal reflux  disease 12/08/2015  ? Abnormal CT scan, esophagus 12/08/2015  ? Esophageal dysphagia 12/08/2015  ? Abdominal pain, epigastric 12/08/2015  ? Loss of weight 12/08/2015  ? Lower abdominal pain 12/08/2015  ? Back pain 08/26/2015  ? Headache 08/26/2015  ? Essential hypertension 08/26/2015  ? DM type 2 (diabetes mellitus, type 2) ([place]) 08/26/2015  ? Sinus tachycardia 06/07/2015  ? Diarrhea 06/07/2015  ? Preop cardiovascular exam 07/16/2013  ? Atrial flutter ([place]) 07/16/2013  ? Anemia 01/08/2013  ? Leukocytosis 12/17/2012  ? DYSPNEA 02/02/2010  ? DIABETES MELLITUS 07/28/2009  ? Hyperlipidemia 07/28/2009  ? CHOLECYSTECTOMY, HX OF 07/28/2009  ? Hx of CABG '96- cath 05/01/16 07/28/2009  ? HERNIORRHAPHY, HX OF 07/28/2009  ? ? ?REFERRING DIAG: M54.2 cervicalgia (dry needling)  ? ?THERAPY DIAG:  ?Cervicalgia ? ?Abnormal posture ? ?PERTINENT HISTORY: CABG, DM, HTN, arthritis, bilateral knee pain, balance issues, lumbar pain stimulator  ? ?PRECAUTIONS: cardiac, lumbar stimulator ? ?SUBJECTIVE: Feels dry needling has been helping. She was sore after last time, a little sore now but  getting better.  ? ?PAIN:  ?Are you having pain? Yes: NPRS scale: 4/10 ?Pain location: neck  ?Pain description: sharp pain  ?Aggravating factors: movement, looking down  ?Relieving factors: meds  ? ?  ?OBJECTIVE:  ?  ?DI

## 2022-03-27 DIAGNOSIS — M25561 Pain in right knee: Secondary | ICD-10-CM | POA: Diagnosis not present

## 2022-03-28 ENCOUNTER — Other Ambulatory Visit: Payer: Self-pay | Admitting: Orthopedic Surgery

## 2022-03-28 DIAGNOSIS — M25561 Pain in right knee: Secondary | ICD-10-CM

## 2022-03-29 ENCOUNTER — Encounter (HOSPITAL_COMMUNITY): Payer: Medicare Other | Admitting: Physical Therapy

## 2022-03-29 ENCOUNTER — Telehealth (HOSPITAL_COMMUNITY): Payer: Self-pay | Admitting: Physical Therapy

## 2022-03-29 NOTE — Telephone Encounter (Signed)
She was on her way here and had to go back in the house due to stomach issues - please cx today.  ?

## 2022-04-03 ENCOUNTER — Encounter (HOSPITAL_COMMUNITY): Payer: Self-pay | Admitting: Physical Therapy

## 2022-04-03 ENCOUNTER — Ambulatory Visit (HOSPITAL_COMMUNITY): Payer: Medicare Other | Admitting: Physical Therapy

## 2022-04-03 DIAGNOSIS — M542 Cervicalgia: Secondary | ICD-10-CM | POA: Diagnosis not present

## 2022-04-03 DIAGNOSIS — R293 Abnormal posture: Secondary | ICD-10-CM | POA: Diagnosis not present

## 2022-04-03 NOTE — Therapy (Signed)
?OUTPATIENT PHYSICAL THERAPY TREATMENT NOTE ? ? ?Patient Name: Katherine Larson ?MRN: [id number] ?DOB:Jul 09, 1946, 76 y.o., female ?Today's Date: 04/03/2022 ? ?PCP: Harlan Stains, MD ?REFERRING PROVIDER: Eustace Moore, MD ? ? ? ?END OF SESSION:  ? PT End of Session - 04/03/22 1006   ? ? Visit Number 5   ? Number of Visits 16   ? Date for PT Re-Evaluation 04/19/22   ? Authorization Type UHC Medicare   ? PT Start Time 1006   ? PT Stop Time 1045   ? PT Time Calculation (min) 39 min   ? Activity Tolerance Patient tolerated treatment well   ? Behavior During Therapy [facility] for tasks assessed/performed   ? ?  ?  ? ?  ? ? ?Past Medical History:  ?Diagnosis Date  ? Anemia   ? anemia of chronic disease +/- IDA followed by Dr. Earlie Server. received iron infusions in the past  ? Asthma   ? Borderline glaucoma   ? CAD (coronary artery disease)   ? a. s/p CABG 1996. b. low risk nuc 2011. c. LHC 04/2016 due to drop in EF -> occluded native LAD,  widely patent sequential LIMA-D1-LAD.  ? Chronic kidney disease   ? "mild stage of kidney disease"  ? Chronic systolic CHF (congestive heart failure) ([place])   ? Complication of anesthesia   ? DM2 (diabetes mellitus, type 2) ([place])   ? not on any medicine for this at this time, 05/2016  ? Dyspnea   ? with exertion  ? GERD (gastroesophageal reflux disease)   ? Gout   ? History of blood transfusion   ? History of hiatal hernia   ? in 57s  ? History of pneumonia   ? March, 2016  ? HLD (hyperlipidemia)   ? HTN (hypertension)   ? Hyperthyroidism 01/21/2016  ? Inappropriate sinus tachycardia   ? LBBB (left bundle branch block)   ? a. Seen in 04/2016  ? 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 Macular degeneration   ? left  ? Myocardial infarction [facility])   ? 1996  ? Neuropathy   ? Neuropathy   ? NICM (nonischemic cardiomyopathy) ([place])   ? a. Dx 04/2016 - EF 25-30%, diffuse HK, elevated LVEDP, mild MR, mod LAE.  ? Normocytic anemia 10/17/2021  ? NSVT (nonsustained ventricular tachycardia) ([place])   ? PAT (paroxysmal atrial  tachycardia) ([place])   ? Pneumonia   ? PONV (postoperative nausea and vomiting)   ? "after just about every surgery I've had"  ? Rheumatoid arthritis ([place])   ? RA- hands  ? Seasonal allergies   ? ?Past Surgical History:  ?Procedure Laterality Date  ? ABDOMINAL HYSTERECTOMY    ? APPENDECTOMY    ? BACK SURGERY    ? BIOPSY  04/27/2020  ? Procedure: BIOPSY;  Surgeon: Daneil Dolin, MD;  [facility];  Service: Endoscopy;;  ascending colon biopsy  ? CARDIAC CATHETERIZATION N/A 05/01/2016  ? Procedure: Right/Left Heart Cath and Coronary/Graft Angiography;  Surgeon: Leonie Man, MD;  Location: [place] CV LAB;  Service: Cardiovascular;  Laterality: N/A;  ? CHOLECYSTECTOMY    ? COLONOSCOPY  11/2011  ? Dr. Magod:ext/int hemorrhoids, diverticulosis sigmoid colon and distal desc colon, mid-desc colon hyperplastic polyps.   ? COLONOSCOPY WITH PROPOFOL N/A 04/27/2020  ? Rourk: Diverticulosis, hemorrhoids, normal terminal ileum.  Random colon biopsies significant for chronic lymphocytic colitis  ? CORONARY ARTERY BYPASS GRAFT  1996  ? 2 vessels  ? ESOPHAGOGASTRODUODENOSCOPY  11/2011  ?  Dr. Watt Climes: small hiatal hernia, one non-bleeding superficial gastric ulcer, medium-sized diverticulum in area of papilla  ? ESOPHAGOGASTRODUODENOSCOPY N/A 12/29/2015  ? Dr. Gala Romney: Chronic inactive gastritis, normal esophagus status post dilation, duodenal diverticula  ? ESOPHAGOGASTRODUODENOSCOPY (EGD) WITH PROPOFOL N/A 07/24/2019  ? Dr. Gala Romney: Normal esophagus status post empiric dilation, small hiatal hernia, large D2 diverticulum  ? ESOPHAGOGASTRODUODENOSCOPY (EGD) WITH PROPOFOL N/A 06/03/2021  ? Surgeon: Daneil Dolin, MD; normal esophagus, normal stomach, normal duodenum.  Abnormal hypopharyngeal mucosa bilaterally just distal to the base of the tongue (right side greater than left), overlying mucosa appeared irregular and somewhat verrucous in appearance.  Recommended ENT evaluation.  ? EYE SURGERY Bilateral   ? cataract  removal  ? FOOT SURGERY    ? removal bone spur  ? FRACTURE SURGERY Right   ? arm  ? GIVENS CAPSULE STUDY  11/2012  ? Dr. Watt Climes: minimal gastritis, normal small bowel capsule  ? HERNIA REPAIR    ? hiatal hernia  ? ICD IMPLANT N/A 02/04/2021  ? Procedure: ICD IMPLANT;  Surgeon: Evans Lance, MD;  Location: [place] CV LAB;  Service: Cardiovascular;  Laterality: N/A;  ? MALONEY DILATION N/A 07/24/2019  ? Procedure: MALONEY DILATION;  Surgeon: Daneil Dolin, MD;  [facility];  Service: Endoscopy;  Laterality: N/A;  ? MALONEY DILATION N/A 06/03/2021  ? Procedure: MALONEY DILATION;  Surgeon: Daneil Dolin, MD;  [facility];  Service: Endoscopy;  Laterality: N/A;  ? MAXIMUM ACCESS (MAS)POSTERIOR LUMBAR INTERBODY FUSION (PLIF) 1 LEVEL N/A 08/14/2013  ? Procedure:  MAXIMUM ACCESS SURGERY(MAS) POSTERIOR LUMBAR INTERBODY FUSION LUMBAR THREE-FOUR ;  Surgeon: Eustace Moore, MD;  Location: [place] [facility]UR[facility] ORS;  Service: Neurosurgery;  Laterality: N/A;   MAXIMUM ACCESS SURGERY(MAS) POSTERIOR LUMBAR INTERBODY FUSION LUMBAR THREE-FOUR   ? PTCA    ? RIGHT/LEFT HEART CATH AND CORONARY ANGIOGRAPHY N/A 03/26/2020  ? Procedure: RIGHT/LEFT HEART CATH AND CORONARY ANGIOGRAPHY;  Surgeon: Leonie Man, MD;  Location: [place] CV LAB;  Service: Cardiovascular;  Laterality: N/A;  ? SPINAL CORD STIMULATOR BATTERY EXCHANGE N/A 01/19/2021  ? Procedure: Spinal cord stimulator battery change;  Surgeon: Reece Agar, MD;  Location: [place];  Service: Neurosurgery;  Laterality: N/A;  ? SPINAL CORD STIMULATOR INSERTION N/A 06/16/2016  ? Procedure: LUMBAR SPINAL CORD STIMULATOR INSERTION;  Surgeon: Clydell Hakim, MD;  Location: [place] NEURO ORS;  Service: Neurosurgery;  Laterality: N/A;  LUMBAR SPINAL CORD STIMULATOR INSERTION  ? tens  05/2016  ? TONSILLECTOMY    ? ?Patient Active Problem List  ? Diagnosis Date Noted  ? Hypotension 02/10/2022  ? Normocytic anemia 10/17/2021  ? Lymphocytic colitis 07/02/2020  ? Coronary  artery disease, occlusive: CTO proxLAD 03/26/2020  ? Abnormal nuclear stress test 03/26/2020  ? Chronic diarrhea 03/02/2020  ? NICM- new drop in EF 25-30% 05/03/2016  ? Acute pulmonary edema (HCC)   ? LBBB (left bundle branch block) 04/29/2016  ? Chronic combined systolic and diastolic heart failure ([place]) 04/29/2016  ? Abdominal pain, chronic, epigastric 03/07/2016  ? Sepsis ([place])   ? COPD exacerbation ([place]) 01/21/2016  ? Hyperthyroidism 01/21/2016  ? 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 Hypokalemia 01/19/2016  ? Hyponatremia 01/19/2016  ? Diet-controlled diabetes mellitus ([place]) 01/19/2016  ? CAP (community acquired pneumonia) 01/18/2016  ? Mucosal abnormality of stomach   ? Dysphagia   ? Constipation 12/08/2015  ? Gastroesophageal reflux disease 12/08/2015  ? Abnormal CT scan, esophagus 12/08/2015  ? Esophageal dysphagia 12/08/2015  ? Abdominal  pain, epigastric 12/08/2015  ? Loss of weight 12/08/2015  ? Lower abdominal pain 12/08/2015  ? Back pain 08/26/2015  ? Headache 08/26/2015  ? Essential hypertension 08/26/2015  ? DM type 2 (diabetes mellitus, type 2) ([place]) 08/26/2015  ? Sinus tachycardia 06/07/2015  ? Diarrhea 06/07/2015  ? Preop cardiovascular exam 07/16/2013  ? Atrial flutter ([place]) 07/16/2013  ? Anemia 01/08/2013  ? Leukocytosis 12/17/2012  ? DYSPNEA 02/02/2010  ? DIABETES MELLITUS 07/28/2009  ? Hyperlipidemia 07/28/2009  ? CHOLECYSTECTOMY, HX OF 07/28/2009  ? Hx of CABG '96- cath 05/01/16 07/28/2009  ? HERNIORRHAPHY, HX OF 07/28/2009  ? ? ?REFERRING DIAG: M54.2 cervicalgia (dry needling)  ? ?THERAPY DIAG:  ?Cervicalgia ? ?Abnormal posture ? ?PERTINENT HISTORY: CABG, DM, HTN, arthritis, bilateral knee pain, balance issues, lumbar pain stimulator  ? ?PRECAUTIONS: cardiac, lumbar stimulator ? ?SUBJECTIVE: Neck is sore from so much last time. Doing it at home has helped. States 85-90 % improvement since beginning therapy. Pain 3/10 at worst over last few days.  ? ?PAIN:  ?Are you having pain? Yes: NPRS scale:  0/10 ?Pain location: neck  ?Pain description: sharp pain  ?Aggravating factors: movement, looking down  ?Relieving factors: meds  ? ?  ?OBJECTIVE:  ?  ?DIAGNOSTIC FINDINGS:  ?IMPRESSION: ?1. Progressive, seve

## 2022-04-05 ENCOUNTER — Encounter (HOSPITAL_COMMUNITY): Payer: Self-pay | Admitting: Physical Therapy

## 2022-04-05 ENCOUNTER — Ambulatory Visit (HOSPITAL_COMMUNITY): Payer: Medicare Other | Admitting: Physical Therapy

## 2022-04-05 DIAGNOSIS — M542 Cervicalgia: Secondary | ICD-10-CM | POA: Diagnosis not present

## 2022-04-05 DIAGNOSIS — R293 Abnormal posture: Secondary | ICD-10-CM | POA: Diagnosis not present

## 2022-04-05 NOTE — Therapy (Signed)
?OUTPATIENT PHYSICAL THERAPY TREATMENT NOTE ? ? ?Patient Name: Katherine Larson ?MRN: [id number] ?DOB:02/09/1946, 76 y.o., female, female ?Today's Date: 04/05/2022 ? ?PCP: Harlan Stains, MD ?REFERRING PROVIDER: Eustace Moore, MD ? ?PHYSICAL THERAPY DISCHARGE SUMMARY ? ?Visits from Start of Care: 6 ? ?Current functional level related to goals / functional outcomes: ?See below  ?  ?Remaining deficits: ?See below  ?  ?Education / Equipment: ?See assessment  ? ?Patient agrees to discharge. Patient goals were partially met. Patient is being discharged due to being pleased with the current functional level. ? ? ? ?END OF SESSION:  ? PT End of Session - 04/05/22 1036   ? ? Visit Number 6   ? Number of Visits 16   ? Date for PT Re-Evaluation 04/19/22   ? Authorization Type UHC Medicare   ? PT Start Time 1035   ? PT Stop Time 1055   ? PT Time Calculation (min) 20 min   ? Activity Tolerance Patient tolerated treatment well   ? Behavior During Therapy [facility] for tasks assessed/performed   ? ?  ?  ? ?  ? ? ?Past Medical History:  ?Diagnosis Date  ? Anemia   ? anemia of chronic disease +/- IDA followed by Dr. Earlie Server. received iron infusions in the past  ? Asthma   ? Borderline glaucoma   ? CAD (coronary artery disease)   ? a. s/p CABG 1996. b. low risk nuc 2011. c. LHC 04/2016 due to drop in EF -> occluded native LAD,  widely patent sequential LIMA-D1-LAD.  ? Chronic kidney disease   ? "mild stage of kidney disease"  ? Chronic systolic CHF (congestive heart failure) ([place])   ? Complication of anesthesia   ? DM2 (diabetes mellitus, type 2) ([place])   ? not on any medicine for this at this time, 05/2016  ? Dyspnea   ? with exertion  ? GERD (gastroesophageal reflux disease)   ? Gout   ? History of blood transfusion   ? History of hiatal hernia   ? in 59s  ? History of pneumonia   ? March, 2016  ? HLD (hyperlipidemia)   ? HTN (hypertension)   ? Hyperthyroidism 01/21/2016  ? Inappropriate sinus tachycardia   ? LBBB (left bundle branch block)   ? a.  Seen in 04/2016  ? 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 Macular degeneration   ? left  ? Myocardial infarction [facility])   ? 1996  ? Neuropathy   ? Neuropathy   ? NICM (nonischemic cardiomyopathy) ([place])   ? a. Dx 04/2016 - EF 25-30%, diffuse HK, elevated LVEDP, mild MR, mod LAE.  ? Normocytic anemia 10/17/2021  ? NSVT (nonsustained ventricular tachycardia) ([place])   ? PAT (paroxysmal atrial tachycardia) ([place])   ? Pneumonia   ? PONV (postoperative nausea and vomiting)   ? "after just about every surgery I've had"  ? Rheumatoid arthritis ([place])   ? RA- hands  ? Seasonal allergies   ? ?Past Surgical History:  ?Procedure Laterality Date  ? ABDOMINAL HYSTERECTOMY    ? APPENDECTOMY    ? BACK SURGERY    ? BIOPSY  04/27/2020  ? Procedure: BIOPSY;  Surgeon: Daneil Dolin, MD;  [facility];  Service: Endoscopy;;  ascending colon biopsy  ? CARDIAC CATHETERIZATION N/A 05/01/2016  ? Procedure: Right/Left Heart Cath and Coronary/Graft Angiography;  Surgeon: Leonie Man, MD;  Location: [place] CV LAB;  Service: Cardiovascular;  Laterality: N/A;  ? CHOLECYSTECTOMY    ?  COLONOSCOPY  11/2011  ? Dr. Magod:ext/int hemorrhoids, diverticulosis sigmoid colon and distal desc colon, mid-desc colon hyperplastic polyps.   ? COLONOSCOPY WITH PROPOFOL N/A 04/27/2020  ? Rourk: Diverticulosis, hemorrhoids, normal terminal ileum.  Random colon biopsies significant for chronic lymphocytic colitis  ? CORONARY ARTERY BYPASS GRAFT  1996  ? 2 vessels  ? ESOPHAGOGASTRODUODENOSCOPY  11/2011  ? Dr. Watt Climes: small hiatal hernia, one non-bleeding superficial gastric ulcer, medium-sized diverticulum in area of papilla  ? ESOPHAGOGASTRODUODENOSCOPY N/A 12/29/2015  ? Dr. Gala Romney: Chronic inactive gastritis, normal esophagus status post dilation, duodenal diverticula  ? ESOPHAGOGASTRODUODENOSCOPY (EGD) WITH PROPOFOL N/A 07/24/2019  ? Dr. Gala Romney: Normal esophagus status post empiric dilation, small hiatal hernia, large D2 diverticulum  ?  ESOPHAGOGASTRODUODENOSCOPY (EGD) WITH PROPOFOL N/A 06/03/2021  ? Surgeon: Daneil Dolin, MD; normal esophagus, normal stomach, normal duodenum.  Abnormal hypopharyngeal mucosa bilaterally just distal to the base of the tongue (right side greater than left), overlying mucosa appeared irregular and somewhat verrucous in appearance.  Recommended ENT evaluation.  ? EYE SURGERY Bilateral   ? cataract removal  ? FOOT SURGERY    ? removal bone spur  ? FRACTURE SURGERY Right   ? arm  ? GIVENS CAPSULE STUDY  11/2012  ? Dr. Watt Climes: minimal gastritis, normal small bowel capsule  ? HERNIA REPAIR    ? hiatal hernia  ? ICD IMPLANT N/A 02/04/2021  ? Procedure: ICD IMPLANT;  Surgeon: Evans Lance, MD;  Location: [place] CV LAB;  Service: Cardiovascular;  Laterality: N/A;  ? MALONEY DILATION N/A 07/24/2019  ? Procedure: MALONEY DILATION;  Surgeon: Daneil Dolin, MD;  [facility];  Service: Endoscopy;  Laterality: N/A;  ? MALONEY DILATION N/A 06/03/2021  ? Procedure: MALONEY DILATION;  Surgeon: Daneil Dolin, MD;  [facility];  Service: Endoscopy;  Laterality: N/A;  ? MAXIMUM ACCESS (MAS)POSTERIOR LUMBAR INTERBODY FUSION (PLIF) 1 LEVEL N/A 08/14/2013  ? Procedure:  MAXIMUM ACCESS SURGERY(MAS) POSTERIOR LUMBAR INTERBODY FUSION LUMBAR THREE-FOUR ;  Surgeon: Eustace Moore, MD;  Location: [place] NEURO ORS;  Service: Neurosurgery;  Laterality: N/A;   MAXIMUM ACCESS SURGERY(MAS) POSTERIOR LUMBAR INTERBODY FUSION LUMBAR THREE-FOUR   ? PTCA    ? RIGHT/LEFT HEART CATH AND CORONARY ANGIOGRAPHY N/A 03/26/2020  ? Procedure: RIGHT/LEFT HEART CATH AND CORONARY ANGIOGRAPHY;  Surgeon: Leonie Man, MD;  Location: [place] CV LAB;  Service: Cardiovascular;  Laterality: N/A;  ? SPINAL CORD STIMULATOR BATTERY EXCHANGE N/A 01/19/2021  ? Procedure: Spinal cord stimulator battery change;  Surgeon: Reece Agar, MD;  Location: [place];  Service: Neurosurgery;  Laterality: N/A;  ? SPINAL CORD STIMULATOR INSERTION N/A  06/16/2016  ? Procedure: LUMBAR SPINAL CORD STIMULATOR INSERTION;  Surgeon: Clydell Hakim, MD;  Location: [place] NEURO ORS;  Service: Neurosurgery;  Laterality: N/A;  LUMBAR SPINAL CORD STIMULATOR INSERTION  ? tens  05/2016  ? TONSILLECTOMY    ? ?Patient Active Problem List  ? Diagnosis Date Noted  ? Hypotension 02/10/2022  ? Normocytic anemia 10/17/2021  ? Lymphocytic colitis 07/02/2020  ? Coronary artery disease, occlusive: CTO proxLAD 03/26/2020  ? Abnormal nuclear stress test 03/26/2020  ? Chronic diarrhea 03/02/2020  ? NICM- new drop in EF 25-30% 05/03/2016  ? Acute pulmonary edema (HCC)   ? LBBB (left bundle branch block) 04/29/2016  ? Chronic combined systolic and diastolic heart failure ([place]) 04/29/2016  ? Abdominal pain, chronic, epigastric 03/07/2016  ? Sepsis ([place])   ? COPD exacerbation ([place]) 01/21/2016  ? Hyperthyroidism 01/21/2016  ?  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 Hypokalemia 01/19/2016  ? Hyponatremia 01/19/2016  ? Diet-controlled diabetes mellitus ([place]) 01/19/2016  ? CAP (community acquired pneumonia) 01/18/2016  ? Mucosal abnormality of stomach   ? Dysphagia   ? Constipation 12/08/2015  ? Gastroesophageal reflux disease 12/08/2015  ? Abnormal CT scan, esophagus 12/08/2015  ? Esophageal dysphagia 12/08/2015  ? Abdominal pain, epigastric 12/08/2015  ? Loss of weight 12/08/2015  ? Lower abdominal pain 12/08/2015  ? Back pain 08/26/2015  ? Headache 08/26/2015  ? Essential hypertension 08/26/2015  ? DM type 2 (diabetes mellitus, type 2) ([place]) 08/26/2015  ? Sinus tachycardia 06/07/2015  ? Diarrhea 06/07/2015  ? Preop cardiovascular exam 07/16/2013  ? Atrial flutter ([place]) 07/16/2013  ? Anemia 01/08/2013  ? Leukocytosis 12/17/2012  ? DYSPNEA 02/02/2010  ? DIABETES MELLITUS 07/28/2009  ? Hyperlipidemia 07/28/2009  ? CHOLECYSTECTOMY, HX OF 07/28/2009  ? Hx of CABG '96- cath 05/01/16 07/28/2009  ? HERNIORRHAPHY, HX OF 07/28/2009  ? ? ?REFERRING DIAG: M54.2 cervicalgia (dry needling)  ? ?THERAPY DIAG:   ?Cervicalgia ? ?Abnormal posture ? ?PERTINENT HISTORY: CABG, DM, HTN, arthritis, bilateral knee pain, balance issues, lumbar pain stimulator  ? ?PRECAUTIONS: cardiac, lumbar stimulator ? ?SUBJECTIVE: Doing well. Is going out of town ne

## 2022-04-07 ENCOUNTER — Other Ambulatory Visit (HOSPITAL_COMMUNITY): Payer: Self-pay | Admitting: Orthopedic Surgery

## 2022-04-07 DIAGNOSIS — M25561 Pain in right knee: Secondary | ICD-10-CM

## 2022-04-10 ENCOUNTER — Encounter: Payer: Self-pay | Admitting: Cardiovascular Disease

## 2022-04-11 ENCOUNTER — Telehealth (HOSPITAL_COMMUNITY): Payer: Self-pay | Admitting: Pharmacy Technician

## 2022-04-11 ENCOUNTER — Other Ambulatory Visit (HOSPITAL_COMMUNITY): Payer: Self-pay

## 2022-04-11 NOTE — Telephone Encounter (Signed)
Advanced Heart Failure Patient Advocate Encounter  Patient left message regarding Vascepa affordability. Currently there are no grants open to assist with this medication. Placed patient on the PAN waitlist. May be able to help offset paying for Vascepa by providing help with [place].   Called and left the patient a message to return my call.   Charlann Boxer, CPhT

## 2022-04-11 NOTE — Telephone Encounter (Signed)
Currently there are no grants open to assist with this medication. Placed patient on the PAN waitlist. May be able to help offset paying for Vascepa by providing help with [place].   Called and left the patient a message to return my call.

## 2022-04-14 NOTE — Telephone Encounter (Signed)
Patient called back requesting a return call on Friday as she is going out of town for a few days. Called patient back, left message to return the call.

## 2022-04-18 NOTE — Telephone Encounter (Signed)
Advanced Heart Failure Patient Advocate Encounter  The patient was approved for a [employer] grant that will help cover the cost of Farxiga while waiting for a grant to open for [place]. Total amount awarded, $10,000. Eligibility, 03/19/22 - 03/19/23.  ID [id number]  BIN [id number]  PCN [id number]  Group [id number]  Emailed grant information to the patient.  Charlann Boxer, CPhT

## 2022-04-19 ENCOUNTER — Other Ambulatory Visit: Payer: Self-pay | Admitting: Family Medicine

## 2022-04-19 DIAGNOSIS — E538 Deficiency of other specified B group vitamins: Secondary | ICD-10-CM | POA: Diagnosis not present

## 2022-04-19 DIAGNOSIS — M8588 Other specified disorders of bone density and structure, other site: Secondary | ICD-10-CM | POA: Diagnosis not present

## 2022-04-19 DIAGNOSIS — D72829 Elevated white blood cell count, unspecified: Secondary | ICD-10-CM | POA: Diagnosis not present

## 2022-04-19 DIAGNOSIS — E1121 Type 2 diabetes mellitus with diabetic nephropathy: Secondary | ICD-10-CM | POA: Diagnosis not present

## 2022-04-19 DIAGNOSIS — J449 Chronic obstructive pulmonary disease, unspecified: Secondary | ICD-10-CM | POA: Diagnosis not present

## 2022-04-19 DIAGNOSIS — M8580 Other specified disorders of bone density and structure, unspecified site: Secondary | ICD-10-CM

## 2022-04-19 DIAGNOSIS — E0590 Thyrotoxicosis, unspecified without thyrotoxic crisis or storm: Secondary | ICD-10-CM | POA: Diagnosis not present

## 2022-04-19 DIAGNOSIS — Z23 Encounter for immunization: Secondary | ICD-10-CM | POA: Diagnosis not present

## 2022-04-19 DIAGNOSIS — N183 Chronic kidney disease, stage 3 unspecified: Secondary | ICD-10-CM | POA: Diagnosis not present

## 2022-04-19 DIAGNOSIS — D638 Anemia in other chronic diseases classified elsewhere: Secondary | ICD-10-CM | POA: Diagnosis not present

## 2022-04-19 DIAGNOSIS — E785 Hyperlipidemia, unspecified: Secondary | ICD-10-CM | POA: Diagnosis not present

## 2022-04-19 DIAGNOSIS — I2510 Atherosclerotic heart disease of native coronary artery without angina pectoris: Secondary | ICD-10-CM | POA: Diagnosis not present

## 2022-04-19 DIAGNOSIS — I129 Hypertensive chronic kidney disease with stage 1 through stage 4 chronic kidney disease, or unspecified chronic kidney disease: Secondary | ICD-10-CM | POA: Diagnosis not present

## 2022-04-19 DIAGNOSIS — I700 Atherosclerosis of aorta: Secondary | ICD-10-CM | POA: Diagnosis not present

## 2022-04-19 DIAGNOSIS — Z0000 Encounter for general adult medical examination without abnormal findings: Secondary | ICD-10-CM | POA: Diagnosis not present

## 2022-04-19 DIAGNOSIS — I5022 Chronic systolic (congestive) heart failure: Secondary | ICD-10-CM | POA: Diagnosis not present

## 2022-04-25 ENCOUNTER — Other Ambulatory Visit (HOSPITAL_COMMUNITY): Payer: Self-pay | Admitting: Cardiology

## 2022-04-25 ENCOUNTER — Other Ambulatory Visit (HOSPITAL_COMMUNITY): Payer: Self-pay | Admitting: *Deleted

## 2022-05-02 DIAGNOSIS — M47812 Spondylosis without myelopathy or radiculopathy, cervical region: Secondary | ICD-10-CM | POA: Diagnosis not present

## 2022-05-02 DIAGNOSIS — Z9689 Presence of other specified functional implants: Secondary | ICD-10-CM | POA: Diagnosis not present

## 2022-05-02 DIAGNOSIS — M961 Postlaminectomy syndrome, not elsewhere classified: Secondary | ICD-10-CM | POA: Diagnosis not present

## 2022-05-03 ENCOUNTER — Inpatient Hospital Stay (HOSPITAL_COMMUNITY): Payer: Medicare Other | Attending: Hematology & Oncology

## 2022-05-03 DIAGNOSIS — D509 Iron deficiency anemia, unspecified: Secondary | ICD-10-CM | POA: Insufficient documentation

## 2022-05-03 DIAGNOSIS — D72829 Elevated white blood cell count, unspecified: Secondary | ICD-10-CM | POA: Diagnosis not present

## 2022-05-03 DIAGNOSIS — D649 Anemia, unspecified: Secondary | ICD-10-CM

## 2022-05-03 DIAGNOSIS — D72828 Other elevated white blood cell count: Secondary | ICD-10-CM

## 2022-05-03 DIAGNOSIS — M069 Rheumatoid arthritis, unspecified: Secondary | ICD-10-CM | POA: Insufficient documentation

## 2022-05-03 DIAGNOSIS — Z79899 Other long term (current) drug therapy: Secondary | ICD-10-CM | POA: Insufficient documentation

## 2022-05-03 LAB — CBC WITH DIFFERENTIAL/PLATELET
Abs Immature Granulocytes: 0.16 10*3/uL — ABNORMAL HIGH (ref 0.00–0.07)
Basophils Absolute: 0.1 10*3/uL (ref 0.0–0.1)
Basophils Relative: 0 %
Eosinophils Absolute: 0.3 10*3/uL (ref 0.0–0.5)
Eosinophils Relative: 2 %
HCT: 32.5 % — ABNORMAL LOW (ref 36.0–46.0)
Hemoglobin: 10.3 g/dL — ABNORMAL LOW (ref 12.0–15.0)
Immature Granulocytes: 1 %
Lymphocytes Relative: 14 %
Lymphs Abs: 2.3 10*3/uL (ref 0.7–4.0)
MCH: 31.7 pg (ref 26.0–34.0)
MCHC: 31.7 g/dL (ref 30.0–36.0)
MCV: 100 fL (ref 80.0–100.0)
Monocytes Absolute: 0.9 10*3/uL (ref 0.1–1.0)
Monocytes Relative: 5 %
Neutro Abs: 12.7 10*3/uL — ABNORMAL HIGH (ref 1.7–7.7)
Neutrophils Relative %: 78 %
Platelets: 276 10*3/uL (ref 150–400)
RBC: 3.25 MIL/uL — ABNORMAL LOW (ref 3.87–5.11)
RDW: 15.2 % (ref 11.5–15.5)
WBC: 16.5 10*3/uL — ABNORMAL HIGH (ref 4.0–10.5)
nRBC: 0 % (ref 0.0–0.2)

## 2022-05-03 LAB — IRON AND TIBC
Iron: 52 ug/dL (ref 28–170)
Saturation Ratios: 14 % (ref 10.4–31.8)
TIBC: 382 ug/dL (ref 250–450)
UIBC: 330 ug/dL

## 2022-05-03 LAB — FERRITIN: Ferritin: 792 ng/mL — ABNORMAL HIGH (ref 11–307)

## 2022-05-05 ENCOUNTER — Ambulatory Visit (INDEPENDENT_AMBULATORY_CARE_PROVIDER_SITE_OTHER): Payer: Medicare Other

## 2022-05-05 DIAGNOSIS — I255 Ischemic cardiomyopathy: Secondary | ICD-10-CM

## 2022-05-08 LAB — CUP PACEART REMOTE DEVICE CHECK
Battery Remaining Longevity: 107 mo
Battery Remaining Percentage: 88 %
Battery Voltage: 3.01 V
Brady Statistic RV Percent Paced: 1 %
Date Time Interrogation Session: 20230616020048
HighPow Impedance: 56 Ohm
Implantable Lead Implant Date: 20220318
Implantable Lead Location: 753860
Implantable Pulse Generator Implant Date: 20220318
Lead Channel Impedance Value: 480 Ohm
Lead Channel Pacing Threshold Amplitude: 0.75 V
Lead Channel Pacing Threshold Pulse Width: 0.5 ms
Lead Channel Sensing Intrinsic Amplitude: 12 mV
Lead Channel Setting Pacing Amplitude: 2.5 V
Lead Channel Setting Pacing Pulse Width: 0.5 ms
Lead Channel Setting Sensing Sensitivity: 0.5 mV
Pulse Gen Serial Number: 810021948

## 2022-05-09 NOTE — Progress Notes (Unsigned)
[facility] [address], [place] [address]   CLINIC:  Medical Oncology/Hematology  PCP:  Laurann Montana, MD [address] [place] [address] [phone]   REASON FOR VISIT:  Follow-up for iron deficiency anemia and leukocytosis   CURRENT THERAPY: Intermittent IV iron infusions (Feraheme on 10/21/2021 and 10/28/2021)  INTERVAL HISTORY:  [name] 76 y.o. female returns for routine follow-up of her iron deficiency anemia and leukocytosis.  She was last seen by Rojelio Brenner PA-C on 01/03/2022.  At today's visit, she reports feeling fair.  No recent hospitalizations, surgeries, or changes in baseline health status.  She has not noticed any recent bleeding such as epistaxis, hematemesis, hematochezia, or melena.  She continues to have chronic fatigue that waxes and wanes from day today.  She denies any headaches, pica, restless legs, palpitations, chest pain, dyspnea on exertion, lightheadedness, or syncope. She denies any B symptoms such as fever, chills, night sweats, unintentional weight loss.  No new lumps or bumps.   She has 40% energy and  70% appetite. She endorses that she is maintaining a stable weight.   REVIEW OF SYSTEMS:    Review of Systems  Constitutional:  Positive for fatigue. Negative for appetite change, chills, diaphoresis, fever and unexpected weight change.  HENT:   Positive for trouble swallowing. Negative for lump/mass and nosebleeds.   Eyes:  Negative for eye problems.  Respiratory:  Positive for cough and shortness of breath. Negative for hemoptysis.   Cardiovascular:  Negative for chest pain, leg swelling and palpitations.  Gastrointestinal:  Positive for nausea. Negative for abdominal pain, blood in stool, constipation, diarrhea and vomiting.  Genitourinary:  Negative for hematuria.   Musculoskeletal:  Positive for back pain and neck pain.  Skin: Negative.   Neurological:  Positive for dizziness. Negative for  headaches and light-headedness.  Hematological:  Does not bruise/bleed easily.      PAST MEDICAL/SURGICAL HISTORY:  Past Medical History:  Diagnosis Date   Anemia    anemia of chronic disease +/- IDA followed by Dr. Shirline Frees. received iron infusions in the past   Asthma    Borderline glaucoma    CAD (coronary artery disease)    a. s/p CABG 1996. b. low risk nuc 2011. c. LHC 04/2016 due to drop in EF -> occluded native LAD,  widely patent sequential LIMA-D1-LAD.   Chronic kidney disease    "mild stage of kidney disease"   Chronic systolic CHF (congestive heart failure) (HCC)    Complication of anesthesia    DM2 (diabetes mellitus, type 2) (HCC)    not on any medicine for this at this time, 05/2016   Dyspnea    with exertion   GERD (gastroesophageal reflux disease)    Gout    History of blood transfusion    History of hiatal hernia    in 20s   History of pneumonia    March, 2016   HLD (hyperlipidemia)    HTN (hypertension)    Hyperthyroidism 01/21/2016   Inappropriate sinus tachycardia    LBBB (left bundle branch block)    a. 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 Dx 04/2016 - EF 25-30%, diffuse HK, elevated LVEDP, mild MR, mod LAE.   Normocytic anemia 10/17/2021   NSVT (nonsustained  ventricular tachycardia) (HCC)    PAT (paroxysmal atrial tachycardia) (HCC)    Pneumonia    PONV (postoperative nausea and vomiting)    "after just about every surgery I've had"   Rheumatoid arthritis ([place])    RA- hands   Seasonal allergies    Past Surgical History:  Procedure Laterality Date   ABDOMINAL HYSTERECTOMY     APPENDECTOMY     BACK SURGERY     BIOPSY  04/27/2020   Procedure: BIOPSY;  Surgeon: Daneil Dolin, MD;  [facility];  Service: Endoscopy;;  ascending colon biopsy   CARDIAC CATHETERIZATION N/A 05/01/2016    Procedure: Right/Left Heart Cath and Coronary/Graft Angiography;  Surgeon: Leonie Man, MD;  Location: [place] [facility];  Service: Cardiovascular;  Laterality: N/A;   CHOLECYSTECTOMY     COLONOSCOPY  11/2011   Dr. Magod:ext/int hemorrhoids, diverticulosis sigmoid colon and distal desc colon, mid-desc colon hyperplastic polyps.    COLONOSCOPY WITH PROPOFOL N/A 04/27/2020   Rourk: Diverticulosis, hemorrhoids, normal terminal ileum.  Random colon biopsies significant for chronic lymphocytic colitis   CORONARY ARTERY BYPASS GRAFT  1996   2 vessels   ESOPHAGOGASTRODUODENOSCOPY  11/2011   Dr. Watt Climes: small hiatal hernia, one non-bleeding superficial gastric ulcer, medium-sized diverticulum in area of papilla   ESOPHAGOGASTRODUODENOSCOPY N/A 12/29/2015   Dr. Gala Romney: Chronic inactive gastritis, normal esophagus status post dilation, duodenal diverticula   ESOPHAGOGASTRODUODENOSCOPY (EGD) WITH PROPOFOL N/A 07/24/2019   Dr. Gala Romney: Normal esophagus status post empiric dilation, small hiatal hernia, large D2 diverticulum   ESOPHAGOGASTRODUODENOSCOPY (EGD) WITH PROPOFOL N/A 06/03/2021   Surgeon: Daneil Dolin, MD; normal esophagus, normal stomach, normal duodenum.  Abnormal hypopharyngeal mucosa bilaterally just distal to the base of the tongue (right side greater than left), overlying mucosa appeared irregular and somewhat verrucous in appearance.  Recommended ENT evaluation.   EYE SURGERY Bilateral    cataract removal   FOOT SURGERY     removal bone spur   FRACTURE SURGERY Right    arm   GIVENS CAPSULE STUDY  11/2012   Dr. Watt Climes: minimal gastritis, normal small bowel capsule   HERNIA REPAIR     hiatal hernia   ICD IMPLANT N/A 02/04/2021   Procedure: ICD IMPLANT;  Surgeon: Evans Lance, MD;  Location: [place] [facility];  Service: Cardiovascular;  Laterality: N/A;   MALONEY DILATION N/A 07/24/2019   Procedure: Venia Minks DILATION;  Surgeon: Daneil Dolin, MD;  [facility];   Service: Endoscopy;  Laterality: N/A;   MALONEY DILATION N/A 06/03/2021   Procedure: Venia Minks DILATION;  Surgeon: Daneil Dolin, MD;  [facility];  Service: Endoscopy;  Laterality: N/A;   MAXIMUM ACCESS (MAS)POSTERIOR LUMBAR INTERBODY FUSION (PLIF) 1 LEVEL N/A 08/14/2013   Procedure:  MAXIMUM ACCESS SURGERY(MAS) POSTERIOR LUMBAR INTERBODY FUSION LUMBAR THREE-FOUR ;  Surgeon: Eustace Moore, MD;  Location: [place] [facility];  Service: Neurosurgery;  Laterality: N/A;   MAXIMUM ACCESS SURGERY(MAS) POSTERIOR LUMBAR INTERBODY FUSION LUMBAR THREE-FOUR    PTCA     RIGHT/LEFT HEART CATH AND CORONARY ANGIOGRAPHY N/A 03/26/2020   Procedure: RIGHT/LEFT HEART CATH AND CORONARY ANGIOGRAPHY;  Surgeon: Leonie Man, MD;  Location: [place] [facility];  Service: Cardiovascular;  Laterality: N/A;   SPINAL CORD STIMULATOR BATTERY EXCHANGE N/A 01/19/2021   Procedure: Spinal cord stimulator battery change;  Surgeon: Reece Agar, MD;  Location: [place];  Service: Neurosurgery;  Laterality: N/A;   SPINAL CORD STIMULATOR INSERTION N/A 06/16/2016  Procedure: LUMBAR SPINAL CORD STIMULATOR INSERTION;  Surgeon: Clydell Hakim, MD;  Location: [place] [facility];  Service: Neurosurgery;  Laterality: N/A;  LUMBAR SPINAL CORD STIMULATOR INSERTION   tens  05/2016   TONSILLECTOMY       SOCIAL HISTORY:  Social History   Socioeconomic History   Marital status: Married    Spouse name: Not on file   Number of children: 3   Years of education: Not on file   Highest education level: Not on file  Occupational History   Not on file  Tobacco Use   Smoking status: Former    Packs/day: 0.75    Years: 15.00    Total pack years: 11.25    Types: Cigarettes    Quit date: 11/20/1994    Years since quitting: 27.4   Smokeless tobacco: Never   Tobacco comments:    Quit in 1996  Vaping Use   Vaping Use: Never used  Substance and Sexual Activity   Alcohol use: Never    Alcohol/week: 0.0 standard drinks of alcohol   Drug  use: Never   Sexual activity: Yes  Other Topics Concern   Not on file  Social History Narrative   2 living children, one deceased age 81   Right handed   One story home   Drinks coffee am   Social Determinants of Health   Financial Resource Strain: Not on file  Food Insecurity: Not on file  Transportation Needs: Not on file  Physical Activity: Not on file  Stress: Not on file  Social Connections: Not on file  Intimate Partner Violence: Not on file    FAMILY HISTORY:  Family History  Problem Relation Age of Onset   Heart attack Father    Heart attack Mother    Bladder Cancer Brother    Cervical cancer Daughter        ?   Colon cancer Neg Hx     CURRENT MEDICATIONS:  Outpatient Encounter Medications as of 05/10/2022  Medication Sig   albuterol (PROVENTIL HFA;VENTOLIN HFA) 108 (90 Base) MCG/ACT inhaler Take 2 puffs 3 times a day and every 4 hours as needed.   allopurinol (ZYLOPRIM) 300 MG tablet Take 300 mg by mouth daily.   aspirin EC 81 MG EC tablet Take 1 tablet (81 mg total) by mouth daily.   atorvastatin (LIPITOR) 80 MG tablet Take 80 mg by mouth daily.   calcium carbonate (TUMS - DOSED IN MG ELEMENTAL CALCIUM) 500 MG chewable tablet Chew 1 tablet by mouth as needed for indigestion or heartburn.   Carboxymethylcellulose Sodium (THERATEARS OP) Place 1 drop into both eyes 3 (three) times daily as needed (dry eyes).   carvedilol (COREG) 6.25 MG tablet Take 1.5 tablets (9.375 mg total) by mouth 2 (two) times daily.   cholecalciferol (VITAMIN D3) 25 MCG (1000 UT) tablet Take 1,000 Units by mouth daily.   FARXIGA 10 MG TABS tablet TAKE 1 TABLET(10 MG) BY MOUTH DAILY BEFORE BREAKFAST   ferrous sulfate 325 (65 FE) MG tablet Take 325 mg by mouth daily with breakfast.   fexofenadine (ALLEGRA) 180 MG tablet Take 180 mg by mouth daily.   fluticasone (FLONASE) 50 MCG/ACT nasal spray Place 2 sprays into both nostrils daily.   furosemide (LASIX) 40 MG tablet Take 40 mg by mouth 2  (two) times daily.    gabapentin (NEURONTIN) 400 MG capsule TAKE 1 CAPSULE(400 MG) BY MOUTH THREE TIMES DAILY   HYDROcodone-acetaminophen (NORCO) 10-325 MG tablet Take 1 tablet by mouth every  6 (six) hours as needed for moderate pain.   icosapent Ethyl (VASCEPA) 1 g capsule Take 2 capsules (2 g total) by mouth 2 (two) times daily.   Lancets (ONETOUCH DELICA PLUS [id number]) MISC    leflunomide (ARAVA) 20 MG tablet Take 20 mg by mouth daily.   losartan (COZAAR) 100 MG tablet Take 100 mg by mouth daily.   MAGNESIUM-OXIDE 400 (240 Mg) MG tablet TAKE 1 TABLET(400 MG) BY MOUTH DAILY   meclizine (ANTIVERT) 12.5 MG tablet Take 12.5 mg by mouth 3 (three) times daily as needed for dizziness.   montelukast (SINGULAIR) 10 MG tablet Take 10 mg by mouth at bedtime.   Multiple Vitamins-Minerals (OCUVITE ADULT 50+ PO) Take 1 tablet by mouth daily.   nitroGLYCERIN (NITROSTAT) 0.4 MG SL tablet DISSOLVE 1 TABLET UNDER THE  TONGUE EVERY 5 MINUTES AS NEEDED FOR CHEST PAIN. MAX OF 3 TABLETS IN 15 MINUTES. CALL 911 IF PAIN  PERSISTS.   pantoprazole (PROTONIX) 40 MG tablet TAKE 1 TABLET BY MOUTH  TWICE DAILY BEFORE A MEAL   potassium chloride SA (KLOR-CON M) 20 MEQ tablet Take 1 tablet (20 mEq total) by mouth daily.   spironolactone (ALDACTONE) 25 MG tablet Take 1 tablet (25 mg total) by mouth daily.   TRELEGY ELLIPTA 200-62.5-25 MCG/ACT AEPB Inhale 1 puff into the lungs daily.   No facility-administered encounter medications on file as of 05/10/2022.    ALLERGIES:  Allergies  Allergen Reactions   Biaxin [Clarithromycin] Rash and Other (See Comments)    Blisters in mouth    Penicillins Rash and Other (See Comments)    Blisters in mouth Has patient had a PCN reaction causing immediate rash, facial/tongue/throat swelling, SOB or lightheadedness with hypotension: Yesyes Has patient had a PCN reaction causing severe rash involving mucus membranes or skin necrosis: Nono Has patient had a PCN reaction that required  hospitalization Nono Has patient had a PCN reaction occurring within the last 10 years: Nono If all of the above answers are "NO", then may proceed   Losartan Potassium     Other reaction(s): elevated creatinine   Metformin Hcl Er     Other reaction(s): diarrhea with 1000 mg     PHYSICAL EXAM:  ECOG PERFORMANCE STATUS: 1 - Symptomatic but completely ambulatory    There were no vitals filed for this visit. There were no vitals filed for this visit. Physical Exam Constitutional:      Appearance: Normal appearance.  HENT:     Head: Normocephalic and atraumatic.     Mouth/Throat:     Mouth: Mucous membranes are moist.  Eyes:     Extraocular Movements: Extraocular movements intact.     Pupils: Pupils are equal, round, and reactive to light.  Cardiovascular:     Rate and Rhythm: Normal rate and regular rhythm.     Pulses: Normal pulses.     Heart sounds: Normal heart sounds.  Pulmonary:     Effort: Pulmonary effort is normal.     Breath sounds: Normal breath sounds. Decreased air movement present.  Abdominal:     General: Bowel sounds are normal.     Palpations: Abdomen is soft.     Tenderness: There is no abdominal tenderness.  Musculoskeletal:        General: No swelling.     Right hand: Deformity present.     Left hand: Deformity present.     Right lower leg: No edema.     Left lower leg: No edema.  Comments: Contractures and nodules of bilateral hands in the setting of rheumatoid arthritis  Lymphadenopathy:     Cervical: No cervical adenopathy.  Skin:    General: Skin is warm and dry.  Neurological:     General: No focal deficit present.     Mental Status: She is alert and oriented to person, place, and time.  Psychiatric:        Mood and Affect: Mood normal.        Behavior: Behavior normal.   LABORATORY DATA:  I have reviewed the labs as listed.  CBC    Component Value Date/Time   WBC 16.5 (H) 05/03/2022 0924   RBC 3.25 (L) 05/03/2022 0924   HGB 10.3  (L) 05/03/2022 0924   HGB 12.4 11/01/2015 1134   HCT 32.5 (L) 05/03/2022 0924   HCT 37.4 11/01/2015 1134   PLT 276 05/03/2022 0924   PLT 205 11/01/2015 1134   MCV 100.0 05/03/2022 0924   MCV 91.7 11/01/2015 1134   MCH 31.7 05/03/2022 0924   MCHC 31.7 05/03/2022 0924   RDW 15.2 05/03/2022 0924   RDW 15.0 (H) 11/01/2015 1134   LYMPHSABS 2.3 05/03/2022 0924   LYMPHSABS 2.9 11/01/2015 1134   MONOABS 0.9 05/03/2022 0924   MONOABS 0.7 11/01/2015 1134   EOSABS 0.3 05/03/2022 0924   EOSABS 0.4 11/01/2015 1134   BASOSABS 0.1 05/03/2022 0924   BASOSABS 0.0 11/01/2015 1134      Latest Ref Rng & Units 02/21/2022   10:22 AM 01/10/2022    1:43 PM 12/19/2021    2:38 PM  CMP  Glucose 70 - 99 mg/dL 113  126  147   BUN 8 - 23 mg/dL 32  34  33   Creatinine 0.44 - 1.00 mg/dL 1.21  1.06  1.01   Sodium 135 - 145 mmol/L 138  135  135   Potassium 3.5 - 5.1 mmol/L 4.5  4.7  4.5   Chloride 98 - 111 mmol/L 99  96  98   CO2 22 - 32 mmol/L $RemoveB'27  30  26   'SCjNbJCO$ Calcium 8.9 - 10.3 mg/dL 9.6  9.6  9.3     DIAGNOSTIC IMAGING:  I have independently reviewed the relevant imaging and discussed with the patient.  ASSESSMENT & PLAN: 1.  Normocytic anemia  - Previously seen by Dr. Earlie Server for anemia of chronic disease +/- iron deficiency.  She was discharged from hematology clinic in 2016 as her anemia had been stable on Nu-iron supplement. - Labs sent by PCP (09/16/2021) shows normocytic anemia with Hgb 9.6/MCV 92.2, iron saturation 11%, TIBC 373.  (Ferritin not checked.)  She had normal creatinine 0.94 when checked on 06/23/2021. - No history of blood transfusion.  Received IV Feraheme in 2015 and in August 2022. - Currently taking iron supplement Nu-iron once daily.  Also takes pantoprazole and aspirin 81 mg daily at home. - Reports episode of dark stool in the summer 2022, and followed with GI.  No current symptoms of bright red blood per rectum or melena.   - EGD (06/03/2021 by Dr. Gala Romney): Normal esophagus, stomach,  duodenum - Colonoscopy (04/27/2020 by Dr. Gala Romney): Internal hemorrhoids and diverticulosis - Hemoccult stool x3 negative (December 2022) - Work-up of anemia (10/17/2021) showed Hgb 8.9/MCV 101.0.   Iron saturation 15% but elevated ferritin 329 (high suspicion that elevated ferritin was acute phase reactant, as multiple inflammatory markers were also positive and patient has underlying RA).   Normal folate, copper, methylmalonic acid.  Elevated B12 at  2718 (likely acute phase reactant).   SPEP negative for M spike.  Immunofixation negative for monoclonal protein.  Mildly elevated kappa light chains 41.1/normal lambda 25.6, normal ratio 1.61. BMP (02/21/2022) shows creatinine 1.21/GFR 47. - She received IV Feraheme x2 on 10/21/2021 and 10/28/2021, but without any significant improvement in symptoms.  She continues to have fatigue, palpitations, restless legs, dyspnea on exertion.   - No bright red blood per rectum or melena  - Most recent labs (05/03/2022): Hgb 10.3/MCV 100, ferritin 792, iron saturation 14 % with TIBC 382 - Differential diagnosis favors anemia of chronic disease with functional iron deficiency.  She may have some anemia related to her leflunomide, but anemia is not severe enough to warrant decreasing her leflunomide dose. - PLAN: No indication for IV iron supplementation at this time.  Hemoglobin improved after IV iron in December 2022. - Repeat labs and RTC via phone visit in 4 months - Patient instructed to continue oral iron supplementation, but to take it at least 4 hours after her PPI, along with a glass of orange juice to improve absorption. - If she has worsening anemia despite iron repletion, we will consider bone marrow biopsy or holding leflunomide if rheumatology is agreeable.  2.  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 Earlie Server, who thought patient to have  reactive leukocytosis. - BCR/ABL by PCR testing was negative in January 2014 - JAK2 with reflex was negative - Inflammatory markers positive (10/17/2021): CRP 2.4, ESR 76, elevated B12 and ferritin - She had COVID-19 infection in November 2022. - She uses steroid inhaler (Trelegy) and intermittent Kenalog cream. - She is a current non-smoker. - She has underlying rheumatoid arthritis.  She is on leflunomide, which is managed by Dr. Gavin Pound. - She denies any B symptoms such as fever, chills, night sweats, unintentional weight loss.  No new lumps or bumps.    - Most recent labs (12/27/2021): WBC 11.1 with normal differential - Differential diagnosis favors reactive leukocytosis in the setting of rheumatoid arthritis and chronic disease - PLAN: No further work-up or intervention at this time.  We will continue surveillance with repeat CBC in 4 months.    PLAN SUMMARY & DISPOSITION: Repeat labs and RTC in 4 months with PHONE visit   All questions were answered. The patient knows to call the clinic with any problems, questions or concerns.  Medical decision making: Low    Time spent on visit: I spent 15 minutes counseling the patient face to face. The total time spent in the appointment was 25 minutes and more than 50% was on counseling.   Harriett Rush, PA-C  05/10/2022 1:43 PM

## 2022-05-10 ENCOUNTER — Inpatient Hospital Stay (HOSPITAL_COMMUNITY): Payer: Medicare Other | Admitting: Physician Assistant

## 2022-05-10 VITALS — BP 107/59 | HR 87 | Temp 97.5°F | Resp 18 | Ht 62.0 in | Wt 132.9 lb

## 2022-05-10 DIAGNOSIS — Z79899 Other long term (current) drug therapy: Secondary | ICD-10-CM | POA: Diagnosis not present

## 2022-05-10 DIAGNOSIS — D72828 Other elevated white blood cell count: Secondary | ICD-10-CM | POA: Diagnosis not present

## 2022-05-10 DIAGNOSIS — D509 Iron deficiency anemia, unspecified: Secondary | ICD-10-CM | POA: Diagnosis not present

## 2022-05-10 DIAGNOSIS — D649 Anemia, unspecified: Secondary | ICD-10-CM

## 2022-05-10 DIAGNOSIS — M069 Rheumatoid arthritis, unspecified: Secondary | ICD-10-CM | POA: Diagnosis not present

## 2022-05-10 DIAGNOSIS — D72829 Elevated white blood cell count, unspecified: Secondary | ICD-10-CM | POA: Diagnosis not present

## 2022-05-10 NOTE — Patient Instructions (Addendum)
[facility] Discharge Instructions  You were seen today by Tarri Abernethy PA-C for your anemia related to chronic disease and low iron.  Your blood and iron levels are improved after the IV iron you received in December.  You do not need any additional IV iron at this time.  Your white blood cell count remains mildly elevated, but stable.  As we discussed, this is likely reactive due to your underlying rheumatoid arthritis and inhaled steroid use (Trelegy).  LABS: Return in 4 months for repeat labs  FOLLOW-UP APPOINTMENT: Phone visit in 4 months, after labs   Thank you for choosing [facility] at [facility] to provide your oncology and hematology care.  To afford each patient quality time with our provider, please arrive at least 15 minutes before your scheduled appointment time.   If you have a lab appointment with the [facility] please come in thru the Main Entrance and check in at the main information desk.  You need to re-schedule your appointment should you arrive 10 or more minutes late.  We strive to give you quality time with our providers, and arriving late affects you and other patients whose appointments are after yours.  Also, if you no show three or more times for appointments you may be dismissed from the clinic at the providers discretion.     Again, thank you for choosing [facility].  Our hope is that these requests will decrease the amount of time that you wait before being seen by our physicians.       _____________________________________________________________  Should you have questions after your visit to [facility], please contact our office at [phone] and follow the prompts.  Our office hours are 8:00 a.m. and 4:30 p.m. Monday - Friday.  Please note that voicemails left after 4:00 p.m. may not be returned until the following business day.  We are closed weekends and major  holidays.  You do have access to a nurse 24-7, just call the main number to the clinic [phone] and do not press any options, hold on the line and a nurse will answer the phone.    For prescription refill requests, have your pharmacy contact our office and allow 72 hours.    Due to Covid, you will need to wear a mask upon entering the hospital. If you do not have a mask, a mask will be given to you at the Main Entrance upon arrival. For doctor visits, patients may have 1 support person age 34 or older with them. For treatment visits, patients can not have anyone with them due to social distancing guidelines and our immunocompromised population.

## 2022-05-12 NOTE — Progress Notes (Signed)
Remote ICD transmission.   

## 2022-05-15 NOTE — Progress Notes (Signed)
Referring Provider: Harlan Stains, MD Primary Care Physician:  Katherine Stains, MD Primary GI Physician: Dr. Gala Larson  Chief Complaint  Patient presents with   Follow-up    Pt states she has no complaints today    HPI:   Katherine Larson is a 76 y.o. 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    Dysphagia previously evaluated with EGD in 2020, esophagus dilated, but she has persistent symptoms.  BPE November 2020 with moderate esophageal dysmotility with prolonged thoracic retention, laryngeal penetration to the level of vocal cords, prominent GERD.  She was offered modified barium swallow study and possible esophageal manometry, but patient declined.  Repeat BPE 03/15/2021: Esophageal dysmotility, likely presbyesophagus, small hiatal hernia, impression upon contrast column from posterior hypopharynx with recommendations to consider direct visualization, laryngeal penetration without aspiration, spontaneous GERD.  EGD 06/03/21 with  normal esophagus, normal stomach, normal duodenum.  Abnormal hypopharyngeal mucosa bilaterally just distal to the base of the tongue (right side greater than left), overlying mucosa appeared irregular and somewhat verrucous in appearance.  Recommended ENT evaluation.  Evaluated by ENT who stated patient had no mucosal lesions in the hypopharynx or larynx on exam. 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 Recommend consideration for Botox injections of the cricopharyngeus. Last resort option would be surgical resection of the cricopharyngeal muscle.   She saw Dr. Joya Larson with ENT/Head and Neck Surgery on 09/07/21. She had FEES the same day just prior to this visit. She was found to have slight-mild  pharyngeal dysphagia, likely impacted by generalized deconditioning and age-related changes to the swallowing mechanism. Recommended regular solids/thin liquids, with adherence to aspiration precautions and swallowing strategies including alternating liquids and solids and performing intermittent post-swallow throat clearing with liquids. Overall, Dr. Joya Larson felt she would do relatively well with conservative treatment. Recommended formal esophagram and an evaluation in the voice lab and return for follow-up on the same day, but this was not completed.   Last seen in our office in March 2023.  Reflux was doing well on Protonix twice daily.  Never tried Dexilant. Prior diarrhea resolved.  She was having constipation with 2-3 bowel movements daily taking 1 stool softener every other day.  Associated abdominal bloating without pain, BRBPR, or melena.  Noted 17 pound unintentional weight loss over the last 11 months.  Noted early satiety, but also not eating when she is hungry.  Her dysphagia symptoms were better than they have been in a while she was following recommendations from speech therapist.  Recommended continuing Protonix twice daily, increase full softener to 1-2 daily.  Discussed gastric emptying study to evaluate early satiety, but patient preferred to hold off and work on increasing dietary intake.  Recommended 4-6 small meals daily, 1-2 protein shakes daily, come back to the office in 8 weeks for weight check.   Patient did not return for weight check.  Today:  Constipation:  Doing very well.  Taking 2 stools softeners daily. Bowels moving daily.   GERD:  Well-controlled on Protonix 40 mg twice daily. Hasn't done well with trying to decreased to once daily in the past.   Dysphagia:  Doing ok with lifestyle and dietary changes.  Careful to chop her food up into small pieces, chew thoroughly, take small bites, eat slowly, drink liquids  throughout meals.  Unintentional weight loss/early  satiety:  Weight is stable. Eating better.  Has being more intentional with meals. Husband is helping. Early satiety isn't much of a problem anymore.  No N/V. No abdominal pain.   Wt Readings from Last 8 Encounters:  05/17/22 132 lb 3.2 oz (60 kg)  05/10/22 132 lb 15 oz (60.3 kg)  02/21/22 133 lb 9.6 oz (60.6 kg)  02/10/22 131 lb 9.6 oz (59.7 kg)  01/10/22 132 lb 3.2 oz (60 kg)  01/03/22 130 lb 11.2 oz (59.3 kg)  12/19/21 132 lb (59.9 kg)  11/08/21 133 lb 4.8 oz (60.5 kg)    Anemia:  Continues to follow closely with hematology.  Has not received IV iron since December.  Most recent hemoglobin fairly stable at 10.3 on 05/03/2022.  Iron and iron saturation within normal limits, ferritin elevated at 792. No brbpr or melena.   Past Medical History:  Diagnosis Date   Anemia    anemia of chronic disease +/- IDA followed by Dr. Earlie Larson. received iron infusions in the past   Asthma    Borderline glaucoma    CAD (coronary artery disease)    a. s/p CABG 1996. b. low risk nuc 2011. c. LHC 04/2016 due to drop in EF -> occluded native LAD,  widely patent sequential LIMA-D1-LAD.   Chronic kidney disease    "mild stage of kidney disease"   Chronic systolic CHF (congestive heart failure) (HCC)    Complication of anesthesia    DM2 (diabetes mellitus, type 2) ([place])    not on any medicine for this at this time, 05/2016   Dyspnea    with exertion   GERD (gastroesophageal reflux disease)    Gout    History of blood transfusion    History of hiatal hernia    in 20s   History of pneumonia    March, 2016   HLD (hyperlipidemia)    HTN (hypertension)    Hyperthyroidism 01/21/2016   Inappropriate sinus tachycardia    LBBB (left bundle branch block)    a. Seen in 04/2016   Lymphocytic colitis 04/2020   Macular degeneration    left   Myocardial infarction [facility])    1996   Neuropathy    Neuropathy    NICM (nonischemic cardiomyopathy) ([place])    a. Dx 04/2016 - EF 25-30%, diffuse HK, elevated LVEDP,  mild MR, mod LAE.   Normocytic anemia 10/17/2021   NSVT (nonsustained ventricular tachycardia) (HCC)    PAT (paroxysmal atrial tachycardia) (HCC)    Pneumonia    PONV (postoperative nausea and vomiting)    "after just about every surgery I've had"   Rheumatoid arthritis ([place])    RA- hands   Seasonal allergies     Past Surgical History:  Procedure Laterality Date   ABDOMINAL HYSTERECTOMY     APPENDECTOMY     BACK SURGERY     BIOPSY  04/27/2020   Procedure: BIOPSY;  Surgeon: Daneil Dolin, MD;  [facility];  Service: Endoscopy;;  ascending colon biopsy   CARDIAC CATHETERIZATION N/A 05/01/2016   Procedure: Right/Left Heart Cath and Coronary/Graft Angiography;  Surgeon: Leonie Man, MD;  Location: [facility];  Service: Cardiovascular;  Laterality: N/A;   CHOLECYSTECTOMY     COLONOSCOPY  11/2011   Dr. Magod:ext/int hemorrhoids, diverticulosis sigmoid colon and distal desc colon, mid-desc colon hyperplastic polyps.    COLONOSCOPY WITH PROPOFOL N/A 04/27/2020   Rourk: Diverticulosis, hemorrhoids, normal terminal ileum.  Random  colon biopsies significant for chronic lymphocytic colitis   CORONARY ARTERY BYPASS GRAFT  1996   2 vessels   ESOPHAGOGASTRODUODENOSCOPY  11/2011   Dr. Watt Climes: small hiatal hernia, one non-bleeding superficial gastric ulcer, medium-sized diverticulum in area of papilla   ESOPHAGOGASTRODUODENOSCOPY N/A 12/29/2015   Dr. Gala Larson: Chronic inactive gastritis, normal esophagus status post dilation, duodenal diverticula   ESOPHAGOGASTRODUODENOSCOPY (EGD) WITH PROPOFOL N/A 07/24/2019   Dr. Gala Larson: Normal esophagus status post empiric dilation, small hiatal hernia, large D2 diverticulum   ESOPHAGOGASTRODUODENOSCOPY (EGD) WITH PROPOFOL N/A 06/03/2021   Surgeon: Daneil Dolin, MD; normal esophagus, normal stomach, normal duodenum.  Abnormal hypopharyngeal mucosa bilaterally just distal to the base of the tongue (right side greater than left), overlying  mucosa appeared irregular and somewhat verrucous in appearance.  Recommended ENT evaluation.   EYE SURGERY Bilateral    cataract removal   FOOT SURGERY     removal bone spur   FRACTURE SURGERY Right    arm   GIVENS CAPSULE STUDY  11/2012   Dr. Watt Climes: minimal gastritis, normal small bowel capsule   HERNIA REPAIR     hiatal hernia   ICD IMPLANT N/A 02/04/2021   Procedure: ICD IMPLANT;  Surgeon: Evans Lance, MD;  Location: [facility];  Service: Cardiovascular;  Laterality: N/A;   MALONEY DILATION N/A 07/24/2019   Procedure: Venia Minks DILATION;  Surgeon: Daneil Dolin, MD;  [facility];  Service: Endoscopy;  Laterality: N/A;   MALONEY DILATION N/A 06/03/2021   Procedure: Venia Minks DILATION;  Surgeon: Daneil Dolin, MD;  [facility];  Service: Endoscopy;  Laterality: N/A;   MAXIMUM ACCESS (MAS)POSTERIOR LUMBAR INTERBODY FUSION (PLIF) 1 LEVEL N/A 08/14/2013   Procedure:  MAXIMUM ACCESS SURGERY(MAS) POSTERIOR LUMBAR INTERBODY FUSION LUMBAR THREE-FOUR ;  Surgeon: Eustace Moore, MD;  Location: [facility];  Service: Neurosurgery;  Laterality: N/A;   MAXIMUM ACCESS SURGERY(MAS) POSTERIOR LUMBAR INTERBODY FUSION LUMBAR THREE-FOUR    PTCA     RIGHT/LEFT HEART CATH AND CORONARY ANGIOGRAPHY N/A 03/26/2020   Procedure: RIGHT/LEFT HEART CATH AND CORONARY ANGIOGRAPHY;  Surgeon: Leonie Man, MD;  Location: [facility];  Service: Cardiovascular;  Laterality: N/A;   SPINAL CORD STIMULATOR BATTERY EXCHANGE N/A 01/19/2021   Procedure: Spinal cord stimulator battery change;  Surgeon: Reece Agar, MD;  Location: [facility];  Service: Neurosurgery;  Laterality: N/A;   SPINAL CORD STIMULATOR INSERTION N/A 06/16/2016   Procedure: LUMBAR SPINAL CORD STIMULATOR INSERTION;  Surgeon: Clydell Hakim, MD;  Location: [facility];  Service: Neurosurgery;  Laterality: N/A;  LUMBAR SPINAL CORD STIMULATOR INSERTION   tens  05/2016   TONSILLECTOMY      Current Outpatient  Medications  Medication Sig Dispense Refill   albuterol (PROVENTIL HFA;VENTOLIN HFA) 108 (90 Base) MCG/ACT inhaler Take 2 puffs 3 times a day and every 4 hours as needed.     allopurinol (ZYLOPRIM) 300 MG tablet Take 300 mg by mouth daily.     aspirin EC 81 MG EC tablet Take 1 tablet (81 mg total) by mouth daily.     atorvastatin (LIPITOR) 80 MG tablet Take 80 mg by mouth daily.     calcium carbonate (TUMS - DOSED IN MG ELEMENTAL CALCIUM) 500 MG chewable tablet Chew 1 tablet by mouth as needed for indigestion or heartburn.     carvedilol (COREG) 6.25 MG tablet Take 1.5 tablets (9.375 mg total) by mouth 2 (two) times daily. 180 tablet 3   cholecalciferol (VITAMIN  D3) 25 MCG (1000 UT) tablet Take 1,000 Units by mouth daily.     diclofenac Sodium (VOLTAREN) 1 % GEL Apply topically.     FARXIGA 10 MG TABS tablet TAKE 1 TABLET(10 MG) BY MOUTH DAILY BEFORE BREAKFAST 30 tablet 11   ferrous sulfate 325 (65 FE) MG tablet Take 325 mg by mouth daily with breakfast. Every other day     fexofenadine (ALLEGRA) 180 MG tablet Take 180 mg by mouth daily.     fluticasone (FLONASE) 50 MCG/ACT nasal spray Place 2 sprays into both nostrils daily.     furosemide (LASIX) 40 MG tablet Take 40 mg by mouth 2 (two) times daily.      gabapentin (NEURONTIN) 400 MG capsule TAKE 1 CAPSULE(400 MG) BY MOUTH THREE TIMES DAILY 90 capsule 5   HYDROcodone-acetaminophen (NORCO) 10-325 MG tablet Take 1 tablet by mouth every 6 (six) hours as needed for moderate pain.     icosapent Ethyl (VASCEPA) 1 g capsule Take 2 capsules (2 g total) by mouth 2 (two) times daily. 120 capsule 10   Lancets (ONETOUCH DELICA PLUS [id number]) MISC      leflunomide (ARAVA) 20 MG tablet Take 20 mg by mouth daily.     losartan (COZAAR) 100 MG tablet Take 100 mg by mouth daily.     MAGNESIUM-OXIDE 400 (240 Mg) MG tablet TAKE 1 TABLET(400 MG) BY MOUTH DAILY 30 tablet 2   meclizine (ANTIVERT) 12.5 MG tablet Take 12.5 mg by mouth 3 (three) times daily as needed  for dizziness.     montelukast (SINGULAIR) 10 MG tablet Take 10 mg by mouth at bedtime.     Multiple Vitamins-Minerals (OCUVITE ADULT 50+ PO) Take 1 tablet by mouth daily.     nitroGLYCERIN (NITROSTAT) 0.4 MG SL tablet DISSOLVE 1 TABLET UNDER THE  TONGUE EVERY 5 MINUTES AS NEEDED FOR CHEST PAIN. MAX OF 3 TABLETS IN 15 MINUTES. CALL 911 IF PAIN  PERSISTS. 75 tablet 4   ONETOUCH ULTRA test strip      pantoprazole (PROTONIX) 40 MG tablet TAKE 1 TABLET BY MOUTH  TWICE DAILY BEFORE A MEAL 180 tablet 3   potassium chloride SA (KLOR-CON M) 20 MEQ tablet Take 1 tablet (20 mEq total) by mouth daily. 30 tablet 11   spironolactone (ALDACTONE) 25 MG tablet Take 1 tablet (25 mg total) by mouth daily. 90 tablet 3   TRELEGY ELLIPTA 200-62.5-25 MCG/ACT AEPB Inhale 1 puff into the lungs daily.     No current facility-administered medications for this visit.    Allergies as of 05/17/2022 - Review Complete 05/17/2022  Allergen Reaction Noted   Biaxin [clarithromycin] Rash and Other (See Comments) 02/02/2010   Penicillins Rash and Other (See Comments) 02/02/2010   Losartan potassium  10/09/2021   Metformin hcl er  10/09/2021    Family History  Problem Relation Age of Onset   Heart attack Father    Heart attack Mother    Bladder Cancer Brother    Cervical cancer Daughter        ?   Colon cancer Neg Hx     Social History   Socioeconomic History   Marital status: Married    Spouse name: Not on file   Number of children: 3   Years of education: Not on file   Highest education level: Not on file  Occupational History   Not on file  Tobacco Use   Smoking status: Former    Packs/day: 0.75    Years: 15.00  Total pack years: 11.25    Types: Cigarettes    Quit date: 11/20/1994    Years since quitting: 27.5   Smokeless tobacco: Never   Tobacco comments:    Quit in 1996  Vaping Use   Vaping Use: Never used  Substance and Sexual Activity   Alcohol use: Never    Alcohol/week: 0.0 standard  drinks of alcohol   Drug use: Never   Sexual activity: Yes  Other Topics Concern   Not on file  Social History Narrative   2 living children, one deceased age 28   Right handed   One story home   Drinks coffee am   Social Determinants of Health   Financial Resource Strain: Not on file  Food Insecurity: Not on file  Transportation Needs: Not on file  Physical Activity: Not on file  Stress: Not on file  Social Connections: Not on file    Review of Systems: Gen: Denies fever, chills, cold or flulike symptoms, presyncope, syncope. CV: Denies chest pain, palpitations. Resp: Denies dyspnea, cough. GI: See HPI Heme: See HPI  Physical Exam: BP 97/62   Pulse 84   Temp 97.9 F (36.6 C)   Ht 5' 2.5" (1.588 m)   Wt 132 lb 3.2 oz (60 kg)   BMI 23.79 kg/m  General:   Alert and oriented. No distress noted. Pleasant and cooperative.  Head:  Normocephalic and atraumatic. Eyes:  Conjuctiva clear without scleral icterus. Heart:  S1, S2 present without murmurs appreciated. Lungs:  Clear to auscultation bilaterally. No wheezes, rales, or rhonchi. No distress.  Abdomen:  +BS, soft, non-tender and non-distended. No rebound or guarding. No HSM or masses noted. Msk:  Symmetrical without gross deformities. Normal posture. Extremities:  Without edema. Neurologic:  Alert and  oriented x4 Psych:  Normal mood and affect.   Assessment:   76 y.o. 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   Constipation: Doing very well on 2 stool softeners daily.  No alarm symptoms.  GERD: Well-controlled on Protonix 40 mg twice daily.  States she has not done well with trying to decrease to once daily in the past and would like  to continue twice daily dosing.  Dysphagia: Secondary to esophageal dysmotility, likely presbyesophagus noted on BPE in April 2022.  She is doing well with dietary/lifestyle changes including eating slowly, chewing thoroughly, chopping foods up into small pieces, and drinking liquids throughout meals.  Weight loss: Previously noted 17 pound weight loss between April 2022 in March 2023 that was unintentional.  Patient had reported some early satiety, but also admitted to not eating when she was hungry.  She was offered a gastric emptying study to further evaluate her symptoms as she had an EGD in July 2022 without any significant abnormalities, but patient preferred to monitor.  She has since been more intentional about her oral intake and reports she is feeling better overall.  Prior early satiety resolved.  No other significant GI symptoms.  Weight has now been stable over the last 6 months.   Plan:  Continue 2 stool softeners daily. Continue Protonix 40 mg twice daily. Continue with dietary/lifestyle modifications for dysphagia. Continue eating 3 meals daily and a couple of snacks between to maintain weight. Follow-up in 6 months or sooner if  needed.   Aliene Altes, PA-C [facility] 05/17/2022

## 2022-05-17 ENCOUNTER — Encounter: Payer: Self-pay | Admitting: Gastroenterology

## 2022-05-17 ENCOUNTER — Ambulatory Visit: Payer: Medicare Other | Admitting: Gastroenterology

## 2022-05-17 VITALS — BP 97/62 | HR 84 | Temp 97.9°F | Ht 62.5 in | Wt 132.2 lb

## 2022-05-17 DIAGNOSIS — K219 Gastro-esophageal reflux disease without esophagitis: Secondary | ICD-10-CM

## 2022-05-17 DIAGNOSIS — R1310 Dysphagia, unspecified: Secondary | ICD-10-CM

## 2022-05-17 DIAGNOSIS — R634 Abnormal weight loss: Secondary | ICD-10-CM | POA: Diagnosis not present

## 2022-05-17 DIAGNOSIS — K5900 Constipation, unspecified: Secondary | ICD-10-CM | POA: Diagnosis not present

## 2022-05-17 NOTE — Patient Instructions (Signed)
Continue Protonix 40 mg twice daily 30 minutes before breakfast and dinner.  Continue 2 stool softeners daily for constipation.  Continue to chop meats finely, eat slowly, take small bites, chew thoroughly, and drink plenty of liquids throughout your meals to help with your swallowing problems.  Continue to be intentional about eating 3 meals a day and a couple of snacks between to maintain your weight.  As you are doing very well, we will plan to follow-up with you in 6 months.  Do not hesitate to call if you have any questions or concerns prior to your next visit.  It was great to see you again today!  Aliene Altes, PA-C [facility]

## 2022-05-20 ENCOUNTER — Other Ambulatory Visit: Payer: Self-pay | Admitting: Neurology

## 2022-05-22 ENCOUNTER — Encounter: Payer: Self-pay | Admitting: Cardiology

## 2022-05-22 ENCOUNTER — Ambulatory Visit (HOSPITAL_COMMUNITY)
Admission: RE | Admit: 2022-05-22 | Discharge: 2022-05-22 | Disposition: A | Discharge status: Home / Self Care | Payer: Medicare Other | Source: Ambulatory Visit | Attending: Family Medicine | Admitting: Family Medicine

## 2022-05-22 ENCOUNTER — Encounter (HOSPITAL_COMMUNITY): Payer: Self-pay

## 2022-05-22 VITALS — BP 104/60 | HR 89 | Wt 132.6 lb

## 2022-05-22 DIAGNOSIS — I2510 Atherosclerotic heart disease of native coronary artery without angina pectoris: Secondary | ICD-10-CM | POA: Diagnosis not present

## 2022-05-22 DIAGNOSIS — I5022 Chronic systolic (congestive) heart failure: Secondary | ICD-10-CM | POA: Diagnosis not present

## 2022-05-22 DIAGNOSIS — M1710 Unilateral primary osteoarthritis, unspecified knee: Secondary | ICD-10-CM | POA: Insufficient documentation

## 2022-05-22 DIAGNOSIS — Z7901 Long term (current) use of anticoagulants: Secondary | ICD-10-CM | POA: Insufficient documentation

## 2022-05-22 DIAGNOSIS — I447 Left bundle-branch block, unspecified: Secondary | ICD-10-CM | POA: Diagnosis not present

## 2022-05-22 DIAGNOSIS — Z7984 Long term (current) use of oral hypoglycemic drugs: Secondary | ICD-10-CM | POA: Diagnosis not present

## 2022-05-22 DIAGNOSIS — I2582 Chronic total occlusion of coronary artery: Secondary | ICD-10-CM | POA: Insufficient documentation

## 2022-05-22 DIAGNOSIS — Z79899 Other long term (current) drug therapy: Secondary | ICD-10-CM | POA: Diagnosis not present

## 2022-05-22 DIAGNOSIS — R42 Dizziness and giddiness: Secondary | ICD-10-CM | POA: Insufficient documentation

## 2022-05-22 DIAGNOSIS — I25810 Atherosclerosis of coronary artery bypass graft(s) without angina pectoris: Secondary | ICD-10-CM

## 2022-05-22 DIAGNOSIS — Z951 Presence of aortocoronary bypass graft: Secondary | ICD-10-CM | POA: Diagnosis not present

## 2022-05-22 DIAGNOSIS — Z7982 Long term (current) use of aspirin: Secondary | ICD-10-CM | POA: Diagnosis not present

## 2022-05-22 DIAGNOSIS — M069 Rheumatoid arthritis, unspecified: Secondary | ICD-10-CM | POA: Diagnosis not present

## 2022-05-22 DIAGNOSIS — I110 Hypertensive heart disease with heart failure: Secondary | ICD-10-CM | POA: Insufficient documentation

## 2022-05-22 LAB — LIPID PANEL
Cholesterol: 105 mg/dL (ref 0–200)
HDL: 23 mg/dL — ABNORMAL LOW (ref >40)
LDL Cholesterol: 42 mg/dL (ref 0–99)
Total CHOL/HDL Ratio: 4.6 RATIO
Triglycerides: 200 mg/dL — ABNORMAL HIGH (ref <150)
VLDL: 40 mg/dL (ref 0–40)

## 2022-05-22 LAB — BASIC METABOLIC PANEL
Anion gap: 11 (ref 5–15)
BUN: 30 mg/dL — ABNORMAL HIGH (ref 8–23)
CO2: 28 mmol/L (ref 22–32)
Calcium: 9.6 mg/dL (ref 8.9–10.3)
Chloride: 97 mmol/L — ABNORMAL LOW (ref 98–111)
Creatinine, Ser: 1.44 mg/dL — ABNORMAL HIGH (ref 0.44–1.00)
GFR, Estimated: 38 mL/min — ABNORMAL LOW (ref >60)
Glucose, Bld: 108 mg/dL — ABNORMAL HIGH (ref 70–99)
Potassium: 5 mmol/L (ref 3.5–5.1)
Sodium: 136 mmol/L (ref 135–145)

## 2022-05-22 NOTE — Patient Instructions (Addendum)
Thank you for coming in today  Labs were done today, if any labs are abnormal the clinic will call you No news is good news  Your physician recommends that you schedule a follow-up appointment in:  Please call in August for appointment in October   At the [facility], you and your health needs are our priority. As part of our continuing mission to provide you with exceptional heart care, we have created designated Provider Care Teams. These Care Teams include your primary Cardiologist (physician) and Advanced Practice Providers (APPs- Physician Assistants and Nurse Practitioners) who all work together to provide you with the care you need, when you need it.   You may see any of the following providers on your designated Care Team at your next follow up: Dr [place]i Bickers Dr Haynes Kerns, NP Lyda Jester, [place] [facility] [place], [place] Audry Riles, PharmD   Please be sure to bring in all your medications bottles to every appointment.   If you have any questions or concerns before your next appointment please send [place] a message through [place] or call our office at [phone].    TO LEAVE A MESSAGE FOR THE NURSE SELECT OPTION 2, PLEASE LEAVE A MESSAGE INCLUDING: YOUR NAME DATE OF BIRTH CALL BACK NUMBER REASON FOR CALL**this is important as we prioritize the call backs  YOU WILL RECEIVE A CALL BACK THE SAME DAY AS LONG AS YOU CALL BEFORE 4:00 PM

## 2022-05-22 NOTE — Progress Notes (Signed)
PCP: Harlan Stains, MD Cardiology: Dr. [name] Cancel HF Cardiology: Dr. Aundra Dubin  76 y.o. with history of CAD s/p CABG and ischemic cardiomyopathy was referred by Dr. [name] Cancel for evaluation of CHF.  Patient has a long h/o heart disease.  In 1996, she had CABG with sequential LIMA-LAD and diagonal.  Last cath in 5/21 showed totally occluded LAD with patent LIMA-LAD and diagonal, minimal disease in LCx and RCA. LV systolic function has been reduced.  She has a Research officer, political party ICD.  Last echo in 9/22 showed EF 25-30%, low normal RV function, moderate MR.  She has a chronic LBBB.  She additionally has rheumatoid arthritis and significant low back pain for which she has a spinal stimulator.   She tried to take [facility] in the past and was very tired/fatigued on it.  She was unable to tolerate it even at low dose and stopped it.  She was lightheaded when we tried to increase Coreg to 12.5 mg bid.   Today she returns for HF follow up with her husband. Overall feeling fine. She has occasional positional dizziness, no falls. Limited by knee OA  but can get around without significant dyspnea. Denies palpitations, CP, dizziness, edema, or PND/Orthopnea. Appetite ok. No fever or chills. Weight at home 129 pounds. Taking all medications.    ECG (personally reviewed): none ordered today.  Labs (11/22): K 4.9, creatinine 0.95 Labs (2/23): K 4.7, creatinine 1.06 Labs (4/23): K 4.5, creatinine 1.21, LDL 45, HDL 27, TGs 238  [employer] ICD: Stable thoracic impedance, < 1% v-pacing, no shocks.   PMH: 1. HTN 2. Hyperlipidemia 3. GERD 4. Gout 5. Fe deficiency anemia 6. CAD: CABG in 1996 with sequential LIMA-diagonal and LAD.  - LHC (5/21): totally occluded LAD with patent sequent LIMA-D and LAD, no significant LCx or RCA disease.  7. Chronic systolic CHF: Ischemic cardiomyopathy.  [employer] Jude ICD.  - Echo (9/22): EF 25-30%, low normal RV function, moderate MR.  8. Low back pain: Arthritis, has spinal stimulator.  9. Chronic  LBBB 10. Rheumatoid arthritis.  11. COPD: Prior smoker.   Social History   Socioeconomic History   Marital status: Married    Spouse name: Not on file   Number of children: 3   Years of education: Not on file   Highest education level: Not on file  Occupational History   Not on file  Tobacco Use   Smoking status: Former    Packs/day: 0.75    Years: 15.00    Total pack years: 11.25    Types: Cigarettes    Quit date: 11/20/1994    Years since quitting: 27.5   Smokeless tobacco: Never   Tobacco comments:    Quit in 1996  Vaping Use   Vaping Use: Never used  Substance and Sexual Activity   Alcohol use: Never    Alcohol/week: 0.0 standard drinks of alcohol   Drug use: Never   Sexual activity: Yes  Other Topics Concern   Not on file  Social History Narrative   2 living children, one deceased age 46   Right handed   One story home   Drinks coffee am   Social Determinants of Health   Financial Resource Strain: Not on file  Food Insecurity: Not on file  Transportation Needs: Not on file  Physical Activity: Not on file  Stress: Not on file  Social Connections: Not on file  Intimate Partner Violence: Not on file   Family History  Problem Relation Age of Onset  Heart attack Father    Heart attack Mother    Bladder Cancer Brother    Cervical cancer Daughter        ?   Colon cancer Neg Hx    ROS: All systems reviewed and negative except as per HPI.   Current Outpatient Medications  Medication Sig Dispense Refill   albuterol (PROVENTIL HFA;VENTOLIN HFA) 108 (90 Base) MCG/ACT inhaler Take 2 puffs 3 times a day and every 4 hours as needed.     allopurinol (ZYLOPRIM) 300 MG tablet Take 300 mg by mouth daily.     aspirin EC 81 MG EC tablet Take 1 tablet (81 mg total) by mouth daily.     atorvastatin (LIPITOR) 80 MG tablet Take 80 mg by mouth daily.     calcium carbonate (TUMS - DOSED IN MG ELEMENTAL CALCIUM) 500 MG chewable tablet Chew 1 tablet by mouth as needed for  indigestion or heartburn.     carvedilol (COREG) 6.25 MG tablet Take 1.5 tablets (9.375 mg total) by mouth 2 (two) times daily. 180 tablet 3   cholecalciferol (VITAMIN D3) 25 MCG (1000 UT) tablet Take 1,000 Units by mouth daily.     diclofenac Sodium (VOLTAREN) 1 % GEL Apply topically.     FARXIGA 10 MG TABS tablet TAKE 1 TABLET(10 MG) BY MOUTH DAILY BEFORE BREAKFAST 30 tablet 11   ferrous sulfate 325 (65 FE) MG tablet Take 325 mg by mouth daily with breakfast. Every other day     fexofenadine (ALLEGRA) 180 MG tablet Take 180 mg by mouth daily.     fluticasone (FLONASE) 50 MCG/ACT nasal spray Place 2 sprays into both nostrils daily.     furosemide (LASIX) 40 MG tablet Take 40 mg by mouth 2 (two) times daily.      gabapentin (NEURONTIN) 400 MG capsule TAKE 1 CAPSULE(400 MG) BY MOUTH THREE TIMES DAILY 90 capsule 5   HYDROcodone-acetaminophen (NORCO) 10-325 MG tablet Take 1 tablet by mouth every 6 (six) hours as needed for moderate pain.     icosapent Ethyl (VASCEPA) 1 g capsule Take 2 capsules (2 g total) by mouth 2 (two) times daily. 120 capsule 10   Lancets (ONETOUCH DELICA PLUS [id number]) MISC      leflunomide (ARAVA) 20 MG tablet Take 20 mg by mouth daily.     losartan (COZAAR) 100 MG tablet Take 100 mg by mouth daily.     MAGNESIUM-OXIDE 400 (240 Mg) MG tablet TAKE 1 TABLET(400 MG) BY MOUTH DAILY 30 tablet 2   meclizine (ANTIVERT) 12.5 MG tablet Take 12.5 mg by mouth 3 (three) times daily as needed for dizziness.     montelukast (SINGULAIR) 10 MG tablet Take 10 mg by mouth at bedtime.     Multiple Vitamins-Minerals (OCUVITE ADULT 50+ PO) Take 1 tablet by mouth daily.     nitroGLYCERIN (NITROSTAT) 0.4 MG SL tablet DISSOLVE 1 TABLET UNDER THE  TONGUE EVERY 5 MINUTES AS NEEDED FOR CHEST PAIN. MAX OF 3 TABLETS IN 15 MINUTES. CALL 911 IF PAIN  PERSISTS. 75 tablet 4   ONETOUCH ULTRA test strip      pantoprazole (PROTONIX) 40 MG tablet TAKE 1 TABLET BY MOUTH  TWICE DAILY BEFORE A MEAL 180 tablet 3    potassium chloride SA (KLOR-CON M) 20 MEQ tablet Take 1 tablet (20 mEq total) by mouth daily. 30 tablet 11   spironolactone (ALDACTONE) 25 MG tablet Take 1 tablet (25 mg total) by mouth daily. 90 tablet 3   TRELEGY ELLIPTA 200-62.5-25  MCG/ACT AEPB Inhale 1 puff into the lungs daily.     No current facility-administered medications for this encounter.   Wt Readings from Last 3 Encounters:  05/22/22 60.1 kg (132 lb 9.6 oz)  05/17/22 60 kg (132 lb 3.2 oz)  05/10/22 60.3 kg (132 lb 15 oz)   BP 104/60   Pulse 89   Wt 60.1 kg (132 lb 9.6 oz)   SpO2 95%   BMI 23.87 kg/m  Physical Exam: General:  NAD. No resp difficulty HEENT: Normal Neck: Supple. No JVD. Carotids 2+ bilat; no bruits. No lymphadenopathy or thryomegaly appreciated. Cor: PMI nondisplaced. Regular rate & rhythm. No rubs, gallops or murmurs. Lungs: Clear Abdomen: Soft, nontender, nondistended. No hepatosplenomegaly. No bruits or masses. Good bowel sounds. Extremities: No cyanosis, clubbing, rash, edema Neuro: Alert & oriented x 3, cranial nerves grossly intact. Moves all 4 extremities w/o difficulty. Affect pleasant.  Assessment/Plan: 1. Chronic systolic CHF: Probably ischemic cardiomyopathy (h/o occluded LAD, s/p CABG). Coronaries have been unchanged since CABG in 1996. However, EF has declined over the years and I worry that another process may be present.  She has a chronic LBBB, so LBBB cardiomyopathy may play a role. Unable to obtain cardiac MRI with spinal cord stimulator. NYHA class II symptoms. She is not volume overloaded on exam or by [place]vue.   - Continue spironolactone 25 mg daily. BMET today.  - Continue Farxiga 10 mg daily.  - Continue Coreg 9.375 mg bid (She did not tolerate 12.5 mg bid due to orthostasis).  - Continue losartan 100 mg daily (unable to tolerate and unwilling to re-try).  - Continue Lasix 40 mg bid. - Dr. Aundra Dubin would like her to be re-evaluated by EP for possible CRT upgrade.  She has a LBBB of  borderline width (144 msec on prior ECG).   2. CAD: CABG 1996.  Cath in 5/21 due to fall in EF showed totally occluded LAD, patent RCA and LCx, patent LIMA-LAD and diagonal.  No change, no interventional target.  No chest pain.  - Continue ASA 81 daily.  - Continue atorvastatin 80 daily + Vasce[place], check lipids today.   Follow up in 3-4 months with Dr. Aundra Dubin.  [facility] FNP-BC 05/22/2022

## 2022-05-22 NOTE — Addendum Note (Signed)
Encounter addended by: Payton Mccallum, RN on: 05/22/2022 10:50 AM  Actions taken: Order list changed, Diagnosis association updated, Clinical Note Signed, Flowsheet accepted, Charge Capture section accepted

## 2022-05-25 NOTE — Progress Notes (Deleted)
NEUROLOGY FOLLOW UP OFFICE NOTE  LITITIA SEN [id number]  Assessment/Plan:   1.  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  Right sided peripheral vertigo - either BPPV or cervicogenic 3.  Cervical spondylosis   1.  Increase gabapentin to '400mg'$  three times daily 2.  Consider vestibular rehab (patient defers at this time) 3.  Follow up 6 months.     Subjective:  ROXANNA MCEVER is a 76 year old right-handed female with CAD, CKD, CHF, DM2, rheumatoid arthritis, cervical spondylosis, lumbar DDD with chronic low back pain s/p neurostimulator, asthma and s/p ICD who follows up for ***  UPDATE: Last seen in May 2022.  At that time, I had increased gabapentin to '400mg'$  TID.  ***   HISTORY: In October 2021, she started experiencing numbness and tingling over the right occipital region with throbbing pain.  She also notes soreness on the right side of her neck but no pain radiating down the arm.  Around the same time, she started experiencing dizziness, described as "everything coming in on me".  It occurs if she turns her head to the right or if she rolls over in bed on her right side.  It lasts for just a few seconds.  It occurs daily.  No nausea.  She already takes gabapentin '300mg'$  TID for chronic back pain.     She does have cervical spondylosis as demonstrated on MRI from 02/18/2016 involving C3-4 through C6-7 with bilateral foraminal stenosis at C4-5 and C5-6 but no spinal canal stenosis or cord abnormality.     PAST MEDICAL HISTORY: Past Medical History:  Diagnosis Date   Anemia    anemia of chronic disease +/- IDA followed by Dr. Earlie Server. received iron infusions in the past   Asthma    Borderline glaucoma    CAD (coronary artery disease)    a. s/p CABG 1996. b. low risk nuc 2011. c. LHC 04/2016 due to drop in EF -> occluded native LAD,  widely patent sequential LIMA-D1-LAD.   Chronic kidney disease    "mild stage of kidney disease"   Chronic systolic CHF  (congestive heart failure) (HCC)    Complication of anesthesia    DM2 (diabetes mellitus, type 2) ([place])    not on any medicine for this at this time, 05/2016   Dyspnea    with exertion   GERD (gastroesophageal reflux disease)    Gout    History of blood transfusion    History of hiatal hernia    in 20s   History of pneumonia    March, 2016   HLD (hyperlipidemia)    HTN (hypertension)    Hyperthyroidism 01/21/2016   Inappropriate sinus tachycardia    LBBB (left bundle branch block)    a. 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 Dx 04/2016 - EF 25-30%, diffuse HK, elevated LVEDP, mild MR, mod LAE.   Normocytic anemia 10/17/2021   NSVT (nonsustained ventricular tachycardia) (HCC)    PAT (paroxysmal atrial tachycardia) (HCC)    Pneumonia    PONV (postoperative nausea and vomiting)    "after just about every surgery I've had"   Rheumatoid arthritis ([place])    RA- hands   Seasonal allergies     MEDICATIONS: Current Outpatient Medications on File Prior  to Visit  Medication Sig Dispense Refill   albuterol (PROVENTIL HFA;VENTOLIN HFA) 108 (90 Base) MCG/ACT inhaler Take 2 puffs 3 times a day and every 4 hours as needed.     allopurinol (ZYLOPRIM) 300 MG tablet Take 300 mg by mouth daily.     aspirin EC 81 MG EC tablet Take 1 tablet (81 mg total) by mouth daily.     atorvastatin (LIPITOR) 80 MG tablet Take 80 mg by mouth daily.     calcium carbonate (TUMS - DOSED IN MG ELEMENTAL CALCIUM) 500 MG chewable tablet Chew 1 tablet by mouth as needed for indigestion or heartburn.     carvedilol (COREG) 6.25 MG tablet Take 1.5 tablets (9.375 mg total) by mouth 2 (two) times daily. 180 tablet 3   cholecalciferol (VITAMIN D3) 25 MCG (1000 UT) tablet Take 1,000 Units by mouth daily.     diclofenac Sodium (VOLTAREN) 1 % GEL Apply topically.      FARXIGA 10 MG TABS tablet TAKE 1 TABLET(10 MG) BY MOUTH DAILY BEFORE BREAKFAST 30 tablet 11   ferrous sulfate 325 (65 FE) MG tablet Take 325 mg by mouth daily with breakfast. Every other day     fexofenadine (ALLEGRA) 180 MG tablet Take 180 mg by mouth daily.     fluticasone (FLONASE) 50 MCG/ACT nasal spray Place 2 sprays into both nostrils daily.     furosemide (LASIX) 40 MG tablet Take 40 mg by mouth 2 (two) times daily.      gabapentin (NEURONTIN) 400 MG capsule TAKE 1 CAPSULE(400 MG) BY MOUTH THREE TIMES DAILY 90 capsule 5   HYDROcodone-acetaminophen (NORCO) 10-325 MG tablet Take 1 tablet by mouth every 6 (six) hours as needed for moderate pain.     icosapent Ethyl (VASCEPA) 1 g capsule Take 2 capsules (2 g total) by mouth 2 (two) times daily. 120 capsule 10   Lancets (ONETOUCH DELICA PLUS [id number]) MISC      leflunomide (ARAVA) 20 MG tablet Take 20 mg by mouth daily.     losartan (COZAAR) 100 MG tablet Take 100 mg by mouth daily.     MAGNESIUM-OXIDE 400 (240 Mg) MG tablet TAKE 1 TABLET(400 MG) BY MOUTH DAILY 30 tablet 2   meclizine (ANTIVERT) 12.5 MG tablet Take 12.5 mg by mouth 3 (three) times daily as needed for dizziness.     montelukast (SINGULAIR) 10 MG tablet Take 10 mg by mouth at bedtime.     Multiple Vitamins-Minerals (OCUVITE ADULT 50+ PO) Take 1 tablet by mouth daily.     nitroGLYCERIN (NITROSTAT) 0.4 MG SL tablet DISSOLVE 1 TABLET UNDER THE  TONGUE EVERY 5 MINUTES AS NEEDED FOR CHEST PAIN. MAX OF 3 TABLETS IN 15 MINUTES. CALL 911 IF PAIN  PERSISTS. 75 tablet 4   ONETOUCH ULTRA test strip      pantoprazole (PROTONIX) 40 MG tablet TAKE 1 TABLET BY MOUTH  TWICE DAILY BEFORE A MEAL 180 tablet 3   potassium chloride SA (KLOR-CON M) 20 MEQ tablet Take 1 tablet (20 mEq total) by mouth daily. 30 tablet 11   spironolactone (ALDACTONE) 25 MG tablet Take 1 tablet (25 mg total) by mouth daily. 90 tablet 3   TRELEGY ELLIPTA 200-62.5-25 MCG/ACT AEPB Inhale 1 puff into the lungs daily.      No current facility-administered medications on file prior to visit.    ALLERGIES: Allergies  Allergen Reactions   Biaxin [Clarithromycin] Rash and Other (See Comments)    Blisters in mouth    Penicillins Rash and  Other (See Comments)    Blisters in mouth Has patient had a PCN reaction causing immediate rash, facial/tongue/throat swelling, SOB or lightheadedness with hypotension: Yesyes Has patient had a PCN reaction causing severe rash involving mucus membranes or skin necrosis: Nono Has patient had a PCN reaction that required hospitalization Nono Has patient had a PCN reaction occurring within the last 10 years: Nono If all of the above answers are "NO", then may proceed   Losartan Potassium     Other reaction(s): elevated creatinine   Metformin Hcl Er     Other reaction(s): diarrhea with 1000 mg    FAMILY HISTORY: Family History  Problem Relation Age of Onset   Heart attack Father    Heart attack Mother    Bladder Cancer Brother    Cervical cancer Daughter        ?   Colon cancer Neg Hx       Objective:  *** General: No acute distress.  Patient appears well-groomed.   Head:  Normocephalic/atraumatic Eyes:  Fundi examined but not visualized Neck: supple, no paraspinal tenderness, full range of motion Heart:  Regular rate and rhythm Lungs:  Clear to auscultation bilaterally Back: No paraspinal tenderness Neurological Exam: alert and oriented to person, place, and time.  Speech fluent and not dysarthric, language intact.  CN II-XII intact. Bulk and tone normal, muscle strength 5/5 throughout.  Sensation to light touch intact.  Deep tendon reflexes 2+ throughout, toes downgoing.  Finger to nose testing intact.  Gait normal, Romberg negative.   Metta Clines, DO  CC: Harlan Stains, MD

## 2022-05-26 ENCOUNTER — Encounter: Payer: Self-pay | Admitting: Neurology

## 2022-05-26 ENCOUNTER — Ambulatory Visit: Payer: Medicare Other | Admitting: Neurology

## 2022-05-26 DIAGNOSIS — E119 Type 2 diabetes mellitus without complications: Secondary | ICD-10-CM | POA: Diagnosis not present

## 2022-05-26 DIAGNOSIS — H40013 Open angle with borderline findings, low risk, bilateral: Secondary | ICD-10-CM | POA: Diagnosis not present

## 2022-05-26 DIAGNOSIS — H353132 Nonexudative age-related macular degeneration, bilateral, intermediate dry stage: Secondary | ICD-10-CM | POA: Diagnosis not present

## 2022-05-26 DIAGNOSIS — Z79899 Other long term (current) drug therapy: Secondary | ICD-10-CM | POA: Diagnosis not present

## 2022-05-26 DIAGNOSIS — Z029 Encounter for administrative examinations, unspecified: Secondary | ICD-10-CM

## 2022-05-26 DIAGNOSIS — H524 Presbyopia: Secondary | ICD-10-CM | POA: Diagnosis not present

## 2022-05-30 ENCOUNTER — Telehealth (HOSPITAL_COMMUNITY): Payer: Self-pay

## 2022-05-30 DIAGNOSIS — E1121 Type 2 diabetes mellitus with diabetic nephropathy: Secondary | ICD-10-CM | POA: Diagnosis not present

## 2022-05-30 DIAGNOSIS — I5022 Chronic systolic (congestive) heart failure: Secondary | ICD-10-CM | POA: Diagnosis not present

## 2022-05-30 DIAGNOSIS — E785 Hyperlipidemia, unspecified: Secondary | ICD-10-CM | POA: Diagnosis not present

## 2022-05-30 DIAGNOSIS — J449 Chronic obstructive pulmonary disease, unspecified: Secondary | ICD-10-CM | POA: Diagnosis not present

## 2022-05-30 DIAGNOSIS — I5042 Chronic combined systolic (congestive) and diastolic (congestive) heart failure: Secondary | ICD-10-CM

## 2022-05-30 NOTE — Telephone Encounter (Signed)
Patient's b-met lab order has been placed and appointment scheduled.  Pt aware, agreeable, and verbalized understanding

## 2022-05-30 NOTE — Telephone Encounter (Signed)
-----   Message from Rafael Bihari, [place] sent at 05/22/2022  4:55 PM EDT ----- Kidney function mildly elevated. Repeat BMET in 2 weeks.

## 2022-06-13 ENCOUNTER — Ambulatory Visit (HOSPITAL_COMMUNITY)
Admission: RE | Admit: 2022-06-13 | Discharge: 2022-06-13 | Disposition: A | Discharge status: Home / Self Care | Payer: Medicare Other | Source: Ambulatory Visit | Attending: Internal Medicine | Admitting: Internal Medicine

## 2022-06-13 ENCOUNTER — Telehealth (HOSPITAL_COMMUNITY): Payer: Self-pay | Admitting: *Deleted

## 2022-06-13 DIAGNOSIS — I5042 Chronic combined systolic (congestive) and diastolic (congestive) heart failure: Secondary | ICD-10-CM | POA: Diagnosis not present

## 2022-06-13 LAB — BASIC METABOLIC PANEL
Anion gap: 13 (ref 5–15)
BUN: 44 mg/dL — ABNORMAL HIGH (ref 8–23)
CO2: 26 mmol/L (ref 22–32)
Calcium: 9.4 mg/dL (ref 8.9–10.3)
Chloride: 95 mmol/L — ABNORMAL LOW (ref 98–111)
Creatinine, Ser: 1.4 mg/dL — ABNORMAL HIGH (ref 0.44–1.00)
GFR, Estimated: 39 mL/min — ABNORMAL LOW (ref >60)
Glucose, Bld: 151 mg/dL — ABNORMAL HIGH (ref 70–99)
Potassium: 4.8 mmol/L (ref 3.5–5.1)
Sodium: 134 mmol/L — ABNORMAL LOW (ref 135–145)

## 2022-06-13 MED ORDER — FUROSEMIDE 40 MG PO TABS
ORAL_TABLET | ORAL | Status: DC
Start: 2022-06-13 — End: 2022-06-23

## 2022-06-13 NOTE — Telephone Encounter (Signed)
-----   Message from Rafael Bihari, [place] sent at 06/13/2022 12:02 PM EDT ----- Kidney function remains mildly elevated.   Please decrease Lasix to 40 mg q am and 20 mg q pm.  Repeat BMET in 2-3 weeks.

## 2022-06-13 NOTE — Telephone Encounter (Signed)
Katherine Larson, [place]  06/13/2022  4:33 PM EDT Back to Top    Pt returned call and is aware pt said she will have labs drawn at [facility]. Order placed in epic and mailed to pt.    [name], CMA  06/13/2022  3:58 PM EDT     Called and left patient a detailed message to return a call to our office in reference to her lab results.   [place], FNP  06/13/2022 12:02 PM EDT     Kidney function remains mildly elevated.    Please decrease Lasix to 40 mg q am and 20 mg q pm.   Repeat BMET in 2-3 weeks.

## 2022-06-20 DIAGNOSIS — M7989 Other specified soft tissue disorders: Secondary | ICD-10-CM | POA: Diagnosis not present

## 2022-06-20 DIAGNOSIS — M25461 Effusion, right knee: Secondary | ICD-10-CM | POA: Diagnosis not present

## 2022-06-20 DIAGNOSIS — M1991 Primary osteoarthritis, unspecified site: Secondary | ICD-10-CM | POA: Diagnosis not present

## 2022-06-20 DIAGNOSIS — R5383 Other fatigue: Secondary | ICD-10-CM | POA: Diagnosis not present

## 2022-06-20 DIAGNOSIS — Z6823 Body mass index (BMI) 23.0-23.9, adult: Secondary | ICD-10-CM | POA: Diagnosis not present

## 2022-06-20 DIAGNOSIS — M1009 Idiopathic gout, multiple sites: Secondary | ICD-10-CM | POA: Diagnosis not present

## 2022-06-20 DIAGNOSIS — M0579 Rheumatoid arthritis with rheumatoid factor of multiple sites without organ or systems involvement: Secondary | ICD-10-CM | POA: Diagnosis not present

## 2022-06-20 DIAGNOSIS — Z79899 Other long term (current) drug therapy: Secondary | ICD-10-CM | POA: Diagnosis not present

## 2022-06-21 ENCOUNTER — Telehealth (HOSPITAL_COMMUNITY): Payer: Self-pay | Admitting: *Deleted

## 2022-06-21 NOTE — Telephone Encounter (Signed)
[facility] Rheumatology left vm for return call about pt. I called [place] [phone] no answer.

## 2022-06-22 ENCOUNTER — Other Ambulatory Visit (HOSPITAL_COMMUNITY)
Admission: RE | Admit: 2022-06-22 | Discharge: 2022-06-22 | Disposition: A | Discharge status: Home / Self Care | Payer: Medicare Other | Source: Ambulatory Visit | Attending: Internal Medicine | Admitting: Internal Medicine

## 2022-06-22 ENCOUNTER — Ambulatory Visit (INDEPENDENT_AMBULATORY_CARE_PROVIDER_SITE_OTHER): Payer: Medicare Other | Admitting: Internal Medicine

## 2022-06-22 VITALS — BP 102/60 | HR 96 | Ht 62.5 in | Wt 131.0 lb

## 2022-06-22 DIAGNOSIS — R34 Anuria and oliguria: Secondary | ICD-10-CM | POA: Diagnosis not present

## 2022-06-22 LAB — BASIC METABOLIC PANEL
Anion gap: 11 (ref 5–15)
BUN: 46 mg/dL — ABNORMAL HIGH (ref 8–23)
CO2: 27 mmol/L (ref 22–32)
Calcium: 9.3 mg/dL (ref 8.9–10.3)
Chloride: 96 mmol/L — ABNORMAL LOW (ref 98–111)
Creatinine, Ser: 2.09 mg/dL — ABNORMAL HIGH (ref 0.44–1.00)
GFR, Estimated: 24 mL/min — ABNORMAL LOW (ref >60)
Glucose, Bld: 139 mg/dL — ABNORMAL HIGH (ref 70–99)
Potassium: 5.2 mmol/L — ABNORMAL HIGH (ref 3.5–5.1)
Sodium: 134 mmol/L — ABNORMAL LOW (ref 135–145)

## 2022-06-22 NOTE — Patient Instructions (Signed)
Medication Instructions:  Your physician recommends that you continue on your current medications as directed. Please refer to the Current Medication list given to you today.  *If you need a refill on your cardiac medications before your next appointment, please call your pharmacy*   Lab Work: Your physician recommends that you return for lab work in: Today (BMP)   If you have labs (blood work) drawn today and your tests are completely normal, you will receive your results only by: [place] (if you have MyChart) OR A paper copy in the mail If you have any lab test that is abnormal or we need to change your treatment, we will call you to review the results.   Testing/Procedures: NONE    Follow-Up: At [facility], you and your health needs are our priority.  As part of our continuing mission to provide you with exceptional heart care, we have created designated Provider Care Teams.  These Care Teams include your primary Cardiologist (physician) and Advanced Practice Providers (APPs -  Physician Assistants and Nurse Practitioners) who all work together to provide you with the care you need, when you need it.  We recommend signing up for the patient portal called "MyChart".  Sign up information is provided on this After Visit Summary.  MyChart is used to connect with patients for Virtual Visits (Telemedicine).  Patients are able to view lab/test results, encounter notes, upcoming appointments, etc.  Non-urgent messages can be sent to your provider as well.   To learn more about what you can do with MyChart, go to [email or web address].    Your next appointment:   1 year(s)  The format for your next appointment:   In Person  Provider:   Cristopher Peru, MD    Other Instructions Thank you for choosing [facility]!    Important Information About Sugar

## 2022-06-22 NOTE — Progress Notes (Signed)
HPI Mrs. Renzulli returns today for followup. She is a pleasant 76 yo woman with an ICM s/p CABG, s/p ICD insertion. She has class 2 CHF symtpoms. She has not had an ICD discharges. She has been on GDMT under the direction of Dr. Aundra Dubin. She has not been in the hospital recently. She has LBBB with a QRS duration of about 140 ms.  Allergies  Allergen Reactions   Biaxin [Clarithromycin] Rash and Other (See Comments)    Blisters in mouth    Penicillins Rash and Other (See Comments)    Blisters in mouth Has patient had a PCN reaction causing immediate rash, facial/tongue/throat swelling, SOB or lightheadedness with hypotension: Yesyes Has patient had a PCN reaction causing severe rash involving mucus membranes or skin necrosis: Nono Has patient had a PCN reaction that required hospitalization Nono Has patient had a PCN reaction occurring within the last 10 years: Nono If all of the above answers are "NO", then may proceed   Losartan Potassium     Other reaction(s): elevated creatinine   Metformin Hcl Er     Other reaction(s): diarrhea with 1000 mg     Current Outpatient Medications  Medication Sig Dispense Refill   albuterol (PROVENTIL HFA;VENTOLIN HFA) 108 (90 Base) MCG/ACT inhaler Take 2 puffs 3 times a day and every 4 hours as needed.     allopurinol (ZYLOPRIM) 300 MG tablet Take 300 mg by mouth daily.     aspirin EC 81 MG EC tablet Take 1 tablet (81 mg total) by mouth daily.     atorvastatin (LIPITOR) 80 MG tablet Take 80 mg by mouth daily.     calcium carbonate (TUMS - DOSED IN MG ELEMENTAL CALCIUM) 500 MG chewable tablet Chew 1 tablet by mouth as needed for indigestion or heartburn.     carvedilol (COREG) 6.25 MG tablet Take 1.5 tablets (9.375 mg total) by mouth 2 (two) times daily. 180 tablet 3   cholecalciferol (VITAMIN D3) 25 MCG (1000 UT) tablet Take 1,000 Units by mouth daily.     diclofenac Sodium (VOLTAREN) 1 % GEL Apply topically.     FARXIGA 10 MG TABS tablet TAKE 1  TABLET(10 MG) BY MOUTH DAILY BEFORE BREAKFAST 30 tablet 11   ferrous sulfate 325 (65 FE) MG tablet Take 325 mg by mouth daily with breakfast. Every other day     fexofenadine (ALLEGRA) 180 MG tablet Take 180 mg by mouth daily.     fluticasone (FLONASE) 50 MCG/ACT nasal spray Place 2 sprays into both nostrils daily.     furosemide (LASIX) 40 MG tablet Take 1 tablet (40 mg total) by mouth every morning AND 0.5 tablets (20 mg total) every evening. 30 tablet    gabapentin (NEURONTIN) 400 MG capsule TAKE 1 CAPSULE(400 MG) BY MOUTH THREE TIMES DAILY 90 capsule 5   HYDROcodone-acetaminophen (NORCO) 10-325 MG tablet Take 1 tablet by mouth every 6 (six) hours as needed for moderate pain.     icosapent Ethyl (VASCEPA) 1 g capsule Take 2 capsules (2 g total) by mouth 2 (two) times daily. 120 capsule 10   Lancets (ONETOUCH DELICA PLUS [id number]) MISC      leflunomide (ARAVA) 20 MG tablet Take 20 mg by mouth daily.     losartan (COZAAR) 100 MG tablet Take 100 mg by mouth daily.     MAGNESIUM-OXIDE 400 (240 Mg) MG tablet TAKE 1 TABLET(400 MG) BY MOUTH DAILY 30 tablet 2   meclizine (ANTIVERT) 12.5 MG tablet Take 12.5  mg by mouth 3 (three) times daily as needed for dizziness.     montelukast (SINGULAIR) 10 MG tablet Take 10 mg by mouth at bedtime.     Multiple Vitamins-Minerals (OCUVITE ADULT 50+ PO) Take 1 tablet by mouth daily.     nitroGLYCERIN (NITROSTAT) 0.4 MG SL tablet DISSOLVE 1 TABLET UNDER THE  TONGUE EVERY 5 MINUTES AS NEEDED FOR CHEST PAIN. MAX OF 3 TABLETS IN 15 MINUTES. CALL 911 IF PAIN  PERSISTS. 75 tablet 4   ONETOUCH ULTRA test strip      pantoprazole (PROTONIX) 40 MG tablet TAKE 1 TABLET BY MOUTH  TWICE DAILY BEFORE A MEAL 180 tablet 3   potassium chloride SA (KLOR-CON M) 20 MEQ tablet Take 1 tablet (20 mEq total) by mouth daily. 30 tablet 11   spironolactone (ALDACTONE) 25 MG tablet Take 1 tablet (25 mg total) by mouth daily. 90 tablet 3   TRELEGY ELLIPTA 200-62.5-25 MCG/ACT AEPB Inhale 1 puff  into the lungs daily.     No current facility-administered medications for this visit.     Past Medical History:  Diagnosis Date   Anemia    anemia of chronic disease +/- IDA followed by Dr. Earlie Server. received iron infusions in the past   Asthma    Borderline glaucoma    CAD (coronary artery disease)    a. s/p CABG 1996. b. low risk nuc 2011. c. LHC 04/2016 due to drop in EF -> occluded native LAD,  widely patent sequential LIMA-D1-LAD.   Chronic kidney disease    "mild stage of kidney disease"   Chronic systolic CHF (congestive heart failure) (HCC)    Complication of anesthesia    DM2 (diabetes mellitus, type 2) ([place])    not on any medicine for this at this time, 05/2016   Dyspnea    with exertion   GERD (gastroesophageal reflux disease)    Gout    History of blood transfusion    History of hiatal hernia    in 20s   History of pneumonia    March, 2016   HLD (hyperlipidemia)    HTN (hypertension)    Hyperthyroidism 01/21/2016   Inappropriate sinus tachycardia    LBBB (left bundle branch block)    a. Seen in 04/2016   Lymphocytic colitis 04/2020   Macular degeneration    left   Myocardial infarction [facility])    1996   Neuropathy    Neuropathy    NICM (nonischemic cardiomyopathy) ([place])    a. Dx 04/2016 - EF 25-30%, diffuse HK, elevated LVEDP, mild MR, mod LAE.   Normocytic anemia 10/17/2021   NSVT (nonsustained ventricular tachycardia) (HCC)    PAT (paroxysmal atrial tachycardia) (HCC)    Pneumonia    PONV (postoperative nausea and vomiting)    "after just about every surgery I've had"   Rheumatoid arthritis ([place])    RA- hands   Seasonal allergies     ROS:   All systems reviewed and negative except as noted in the HPI.   Past Surgical History:  Procedure Laterality Date   ABDOMINAL HYSTERECTOMY     APPENDECTOMY     BACK SURGERY     BIOPSY  04/27/2020   Procedure: BIOPSY;  Surgeon: Daneil Dolin, MD;  [facility];  Service: Endoscopy;;  ascending  colon biopsy   CARDIAC CATHETERIZATION N/A 05/01/2016   Procedure: Right/Left Heart Cath and Coronary/Graft Angiography;  Surgeon: Leonie Man, MD;  Location: [place] CV LAB;  Service: Cardiovascular;  Laterality: N/A;   CHOLECYSTECTOMY     COLONOSCOPY  11/2011   Dr. Magod:ext/int hemorrhoids, diverticulosis sigmoid colon and distal desc colon, mid-desc colon hyperplastic polyps.    COLONOSCOPY WITH PROPOFOL N/A 04/27/2020   Rourk: Diverticulosis, hemorrhoids, normal terminal ileum.  Random colon biopsies significant for chronic lymphocytic colitis   CORONARY ARTERY BYPASS GRAFT  1996   2 vessels   ESOPHAGOGASTRODUODENOSCOPY  11/2011   Dr. Watt Climes: small hiatal hernia, one non-bleeding superficial gastric ulcer, medium-sized diverticulum in area of papilla   ESOPHAGOGASTRODUODENOSCOPY N/A 12/29/2015   Dr. Gala Romney: Chronic inactive gastritis, normal esophagus status post dilation, duodenal diverticula   ESOPHAGOGASTRODUODENOSCOPY (EGD) WITH PROPOFOL N/A 07/24/2019   Dr. Gala Romney: Normal esophagus status post empiric dilation, small hiatal hernia, large D2 diverticulum   ESOPHAGOGASTRODUODENOSCOPY (EGD) WITH PROPOFOL N/A 06/03/2021   Surgeon: Daneil Dolin, MD; normal esophagus, normal stomach, normal duodenum.  Abnormal hypopharyngeal mucosa bilaterally just distal to the base of the tongue (right side greater than left), overlying mucosa appeared irregular and somewhat verrucous in appearance.  Recommended ENT evaluation.   EYE SURGERY Bilateral    cataract removal   FOOT SURGERY     removal bone spur   FRACTURE SURGERY Right    arm   GIVENS CAPSULE STUDY  11/2012   Dr. Watt Climes: minimal gastritis, normal small bowel capsule   HERNIA REPAIR     hiatal hernia   ICD IMPLANT N/A 02/04/2021   Procedure: ICD IMPLANT;  Surgeon: Evans Lance, MD;  Location: [place] CV LAB;  Service: Cardiovascular;  Laterality: N/A;   MALONEY DILATION N/A 07/24/2019   Procedure: Venia Minks DILATION;   Surgeon: Daneil Dolin, MD;  [facility];  Service: Endoscopy;  Laterality: N/A;   MALONEY DILATION N/A 06/03/2021   Procedure: Venia Minks DILATION;  Surgeon: Daneil Dolin, MD;  [facility];  Service: Endoscopy;  Laterality: N/A;   MAXIMUM ACCESS (MAS)POSTERIOR LUMBAR INTERBODY FUSION (PLIF) 1 LEVEL N/A 08/14/2013   Procedure:  MAXIMUM ACCESS SURGERY(MAS) POSTERIOR LUMBAR INTERBODY FUSION LUMBAR THREE-FOUR ;  Surgeon: Eustace Moore, MD;  Location: [place] [facility];  Service: Neurosurgery;  Laterality: N/A;   MAXIMUM ACCESS SURGERY(MAS) POSTERIOR LUMBAR INTERBODY FUSION LUMBAR THREE-FOUR    PTCA     RIGHT/LEFT HEART CATH AND CORONARY ANGIOGRAPHY N/A 03/26/2020   Procedure: RIGHT/LEFT HEART CATH AND CORONARY ANGIOGRAPHY;  Surgeon: Leonie Man, MD;  Location: [place] CV LAB;  Service: Cardiovascular;  Laterality: N/A;   SPINAL CORD STIMULATOR BATTERY EXCHANGE N/A 01/19/2021   Procedure: Spinal cord stimulator battery change;  Surgeon: Reece Agar, MD;  Location: [place];  Service: Neurosurgery;  Laterality: N/A;   SPINAL CORD STIMULATOR INSERTION N/A 06/16/2016   Procedure: LUMBAR SPINAL CORD STIMULATOR INSERTION;  Surgeon: Clydell Hakim, MD;  Location: [place] [facility]UR[facility] ORS;  Service: Neurosurgery;  Laterality: N/A;  LUMBAR SPINAL CORD STIMULATOR INSERTION   tens  05/2016   TONSILLECTOMY       Family History  Problem Relation Age of Onset   Heart attack Father    Heart attack Mother    Bladder Cancer Brother    Cervical cancer Daughter        ?   Colon cancer Neg Hx      Social History   Socioeconomic History   Marital status: Married    Spouse name: Not on file   Number of children: 3   Years of education: Not on file   Highest education level: Not on file  Occupational History   Not on file  Tobacco Use   Smoking status: Former    Packs/day: 0.75    Years: 15.00    Total pack years: 11.25    Types: Cigarettes    Quit date: 11/20/1994    Years since  quitting: 27.6   Smokeless tobacco: Never   Tobacco comments:    Quit in 1996  Vaping Use   Vaping Use: Never used  Substance and Sexual Activity   Alcohol use: Never    Alcohol/week: 0.0 standard drinks of alcohol   Drug use: Never   Sexual activity: Yes  Other Topics Concern   Not on file  Social History Narrative   2 living children, one deceased age 88   Right handed   One story home   Drinks coffee am   Social Determinants of Health   Financial Resource Strain: Not on file  Food Insecurity: Not on file  Transportation Needs: Not on file  Physical Activity: Not on file  Stress: Not on file  Social Connections: Not on file  Intimate Partner Violence: Not on file     BP 102/60   Pulse 96   Ht 5' 2.5" (1.588 m)   Wt 131 lb (59.4 kg)   SpO2 96%   BMI 23.58 kg/m   Physical Exam:  Well appearing NAD HEENT: Unremarkable Neck:  No JVD, no thyromegally Lymphatics:  No adenopathy Back:  No CVA tenderness Lungs:  Clear HEART:  Regular rate rhythm, no murmurs, no rubs, no clicks Abd:  soft, positive bowel sounds, no organomegally, no rebound, no guarding Ext:  2 plus pulses, no edema, no cyanosis, no clubbing Skin:  No rashes no nodules Neuro:  CN II through XII intact, motor grossly intact  EKG - reviewed. NSR with LBBB  DEVICE  Normal device function.  See PaceArt for details.   Assess/Plan:  Chronic systolic heart failure - she has class 2 symptoms and LBBB and a QRS of 140 ms. I think that the risk/benefit ratio for the patient favors holding off on Biv ICD upgrade. If her CHF were to worsen or her QRS were to prolong, the biv upgrade would be a consideration.  Decreased urine output - the patient's main complaint when I met with her today was that she was not making as much urine. I will send her for a BMP.  CAD - she is s/p CABG with an occluded LAD.  4. ICD -her Abbott single chamber ICD is working normally. We will recheck in several months.   Carleene Overlie  Ashe Graybeal,MD

## 2022-06-23 ENCOUNTER — Telehealth: Payer: Self-pay

## 2022-06-23 ENCOUNTER — Telehealth (HOSPITAL_COMMUNITY): Payer: Self-pay | Admitting: Surgery

## 2022-06-23 DIAGNOSIS — I5022 Chronic systolic (congestive) heart failure: Secondary | ICD-10-CM

## 2022-06-23 DIAGNOSIS — Z79899 Other long term (current) drug therapy: Secondary | ICD-10-CM

## 2022-06-23 LAB — CUP PACEART INCLINIC DEVICE CHECK
Battery Remaining Longevity: 108 mo
Brady Statistic RV Percent Paced: 0 %
Date Time Interrogation Session: 20230803135500
HighPow Impedance: 72 Ohm
Implantable Lead Implant Date: 20220318
Implantable Lead Location: 753860
Implantable Pulse Generator Implant Date: 20220318
Lead Channel Impedance Value: 512.5 Ohm
Lead Channel Pacing Threshold Amplitude: 0.5 V
Lead Channel Pacing Threshold Amplitude: 0.5 V
Lead Channel Pacing Threshold Pulse Width: 0.5 ms
Lead Channel Pacing Threshold Pulse Width: 0.5 ms
Lead Channel Sensing Intrinsic Amplitude: 12 mV
Lead Channel Setting Pacing Amplitude: 2.5 V
Lead Channel Setting Pacing Pulse Width: 0.5 ms
Lead Channel Setting Sensing Sensitivity: 0.5 mV
Pulse Gen Serial Number: 810021948

## 2022-06-23 MED ORDER — SPIRONOLACTONE 25 MG PO TABS: 12.5000 mg | ORAL_TABLET | Freq: Every day | ORAL | 3 refills | Status: DC

## 2022-06-23 MED ORDER — FUROSEMIDE 40 MG PO TABS: ORAL_TABLET | ORAL | 3 refills | Status: DC

## 2022-06-23 MED ORDER — POTASSIUM CHLORIDE CRYS ER 20 MEQ PO TBCR: 20.0000 meq | EXTENDED_RELEASE_TABLET | Freq: Every day | ORAL | 11 refills | Status: DC

## 2022-06-23 NOTE — Telephone Encounter (Addendum)
Lab results discussed with patient.She will Hold am dose of lasix (40 mg) and only take 20 mg pm dose. Repeat BMET in 2 weeks at [facility] 8/17    I have staff messaged Dr.Taylor to see if there are any changes with potassium dose (20 meq qd) as her K+ was 5.2

## 2022-06-23 NOTE — Telephone Encounter (Signed)
I called patient to review results and latest recommendations per Dr. Aundra Dubin.  She is aware and agreeable.  I have updated medlist in [facility] and scheduled labwork next Friday.

## 2022-06-23 NOTE — Telephone Encounter (Signed)
-----   Message from Evans Lance, MD sent at 06/22/2022  9:23 PM EDT ----- Hold morning lasix. Recheck bmp in 2 weeks.

## 2022-06-23 NOTE — Telephone Encounter (Signed)
Varney Daily, RN  Damian Leavell, RN; Bernita Raisin, RN This is Katherine Larson.   Dr. [name] said to hold her potassium   Katherine Larson         I spoke with patient. She will HOLD her potassium and repeat BMET in 2 weeks

## 2022-06-23 NOTE — Telephone Encounter (Signed)
-----   Message from Larey Dresser, MD sent at 06/23/2022  3:56 PM EDT ----- Stop KCl.  Hold Lasix for 2 days and decrease dose to 40 mg daily.  Hold spironolactone for 2 days then decrease dose to 12.5 mg daily.  BMET in 1 week.

## 2022-06-26 ENCOUNTER — Other Ambulatory Visit: Payer: Self-pay | Admitting: Family Medicine

## 2022-06-26 ENCOUNTER — Ambulatory Visit
Admission: RE | Admit: 2022-06-26 | Discharge: 2022-06-26 | Disposition: A | Discharge status: Home / Self Care | Payer: Medicare Other | Source: Ambulatory Visit | Attending: Family Medicine | Admitting: Family Medicine

## 2022-06-26 DIAGNOSIS — R921 Mammographic calcification found on diagnostic imaging of breast: Secondary | ICD-10-CM

## 2022-06-27 ENCOUNTER — Telehealth: Payer: Self-pay

## 2022-06-27 NOTE — Telephone Encounter (Signed)
I spoke with patient and she will stop potassium. She is scheduled for repeat BMET on 8/18

## 2022-06-27 NOTE — Telephone Encounter (Signed)
-----   Message from Evans Lance, MD sent at 06/27/2022  9:45 AM EDT ----- Regarding: RE: potassium dose Stop the supplemental potassium. GT ----- Message ----- From: Bernita Raisin, RN Sent: 06/23/2022  10:19 AM EDT To: Evans Lance, MD; Damian Leavell, RN Subject: potassium dose                                 K+ drawn 8/3/024 was 5.2   BUN was 46, creatinine was 2.09- AM dose of lasix (40 mg) was stopped, pt to take only 20 mg pm  Repeat BMET in 2 weeks   Do you want to change potassium dose? Current is 20 meq daily.

## 2022-06-30 ENCOUNTER — Ambulatory Visit (HOSPITAL_COMMUNITY)
Admission: RE | Admit: 2022-06-30 | Discharge: 2022-06-30 | Disposition: A | Discharge status: Home / Self Care | Payer: Medicare Other | Source: Ambulatory Visit | Attending: Cardiology | Admitting: Cardiology

## 2022-06-30 DIAGNOSIS — I5022 Chronic systolic (congestive) heart failure: Secondary | ICD-10-CM | POA: Insufficient documentation

## 2022-06-30 LAB — BASIC METABOLIC PANEL
Anion gap: 9 (ref 5–15)
BUN: 27 mg/dL — ABNORMAL HIGH (ref 8–23)
CO2: 24 mmol/L (ref 22–32)
Calcium: 9.7 mg/dL (ref 8.9–10.3)
Chloride: 102 mmol/L (ref 98–111)
Creatinine, Ser: 1.19 mg/dL — ABNORMAL HIGH (ref 0.44–1.00)
GFR, Estimated: 48 mL/min — ABNORMAL LOW (ref >60)
Glucose, Bld: 113 mg/dL — ABNORMAL HIGH (ref 70–99)
Potassium: 3.8 mmol/L (ref 3.5–5.1)
Sodium: 135 mmol/L (ref 135–145)

## 2022-07-10 NOTE — Progress Notes (Signed)
Spoke with patient in regards to upcoming MRI on 8/22. Patient given instructions to arrive at womens entrance at noon for 1 pm appointment.

## 2022-07-11 ENCOUNTER — Encounter (HOSPITAL_COMMUNITY): Payer: Self-pay

## 2022-07-11 ENCOUNTER — Ambulatory Visit (HOSPITAL_COMMUNITY)
Admission: RE | Admit: 2022-07-11 | Discharge: 2022-07-11 | Disposition: A | Discharge status: Home / Self Care | Payer: Medicare Other | Source: Ambulatory Visit | Attending: Orthopedic Surgery | Admitting: Orthopedic Surgery

## 2022-07-11 DIAGNOSIS — M25561 Pain in right knee: Secondary | ICD-10-CM

## 2022-07-18 ENCOUNTER — Emergency Department (HOSPITAL_COMMUNITY): Payer: Medicare Other

## 2022-07-18 ENCOUNTER — Emergency Department (HOSPITAL_COMMUNITY)
Admission: EM | Admit: 2022-07-18 | Discharge: 2022-07-18 | Disposition: A | Discharge status: Home / Self Care | Payer: Medicare Other | Attending: Emergency Medicine | Admitting: Emergency Medicine

## 2022-07-18 ENCOUNTER — Encounter (HOSPITAL_COMMUNITY): Payer: Self-pay | Admitting: Emergency Medicine

## 2022-07-18 ENCOUNTER — Other Ambulatory Visit: Payer: Self-pay

## 2022-07-18 DIAGNOSIS — E1122 Type 2 diabetes mellitus with diabetic chronic kidney disease: Secondary | ICD-10-CM | POA: Insufficient documentation

## 2022-07-18 DIAGNOSIS — I509 Heart failure, unspecified: Secondary | ICD-10-CM | POA: Insufficient documentation

## 2022-07-18 DIAGNOSIS — R079 Chest pain, unspecified: Secondary | ICD-10-CM | POA: Diagnosis not present

## 2022-07-18 DIAGNOSIS — I2510 Atherosclerotic heart disease of native coronary artery without angina pectoris: Secondary | ICD-10-CM | POA: Insufficient documentation

## 2022-07-18 DIAGNOSIS — Z7982 Long term (current) use of aspirin: Secondary | ICD-10-CM | POA: Insufficient documentation

## 2022-07-18 DIAGNOSIS — R0602 Shortness of breath: Secondary | ICD-10-CM | POA: Diagnosis not present

## 2022-07-18 DIAGNOSIS — N189 Chronic kidney disease, unspecified: Secondary | ICD-10-CM | POA: Insufficient documentation

## 2022-07-18 DIAGNOSIS — D72829 Elevated white blood cell count, unspecified: Secondary | ICD-10-CM | POA: Insufficient documentation

## 2022-07-18 DIAGNOSIS — Z20822 Contact with and (suspected) exposure to covid-19: Secondary | ICD-10-CM | POA: Insufficient documentation

## 2022-07-18 DIAGNOSIS — D649 Anemia, unspecified: Secondary | ICD-10-CM | POA: Insufficient documentation

## 2022-07-18 LAB — BASIC METABOLIC PANEL
Anion gap: 12 (ref 5–15)
BUN: 33 mg/dL — ABNORMAL HIGH (ref 8–23)
CO2: 26 mmol/L (ref 22–32)
Calcium: 9.1 mg/dL (ref 8.9–10.3)
Chloride: 97 mmol/L — ABNORMAL LOW (ref 98–111)
Creatinine, Ser: 1.27 mg/dL — ABNORMAL HIGH (ref 0.44–1.00)
GFR, Estimated: 44 mL/min — ABNORMAL LOW (ref >60)
Glucose, Bld: 212 mg/dL — ABNORMAL HIGH (ref 70–99)
Potassium: 3.8 mmol/L (ref 3.5–5.1)
Sodium: 135 mmol/L (ref 135–145)

## 2022-07-18 LAB — CBC WITH DIFFERENTIAL/PLATELET
Abs Immature Granulocytes: 0.08 10*3/uL — ABNORMAL HIGH (ref 0.00–0.07)
Basophils Absolute: 0 10*3/uL (ref 0.0–0.1)
Basophils Relative: 0 %
Eosinophils Absolute: 0.2 10*3/uL (ref 0.0–0.5)
Eosinophils Relative: 2 %
HCT: 34.2 % — ABNORMAL LOW (ref 36.0–46.0)
Hemoglobin: 11 g/dL — ABNORMAL LOW (ref 12.0–15.0)
Immature Granulocytes: 1 %
Lymphocytes Relative: 22 %
Lymphs Abs: 2.6 10*3/uL (ref 0.7–4.0)
MCH: 32.5 pg (ref 26.0–34.0)
MCHC: 32.2 g/dL (ref 30.0–36.0)
MCV: 101.2 fL — ABNORMAL HIGH (ref 80.0–100.0)
Monocytes Absolute: 0.6 10*3/uL (ref 0.1–1.0)
Monocytes Relative: 5 %
Neutro Abs: 8.4 10*3/uL — ABNORMAL HIGH (ref 1.7–7.7)
Neutrophils Relative %: 70 %
Platelets: 211 10*3/uL (ref 150–400)
RBC: 3.38 MIL/uL — ABNORMAL LOW (ref 3.87–5.11)
RDW: 15.9 % — ABNORMAL HIGH (ref 11.5–15.5)
WBC: 11.9 10*3/uL — ABNORMAL HIGH (ref 4.0–10.5)
nRBC: 0 % (ref 0.0–0.2)

## 2022-07-18 LAB — RESP PANEL BY RT-PCR (FLU A&B, COVID) ARPGX2
Influenza A by PCR: NEGATIVE
Influenza B by PCR: NEGATIVE
SARS Coronavirus 2 by RT PCR: NEGATIVE

## 2022-07-18 LAB — TROPONIN I (HIGH SENSITIVITY)
Troponin I (High Sensitivity): 10 ng/L (ref <18)
Troponin I (High Sensitivity): 9 ng/L (ref <18)

## 2022-07-18 LAB — BRAIN NATRIURETIC PEPTIDE: B Natriuretic Peptide: 255 pg/mL — ABNORMAL HIGH (ref 0.0–100.0)

## 2022-07-18 NOTE — ED Provider Notes (Signed)
[facility] EMERGENCY DEPARTMENT Provider Note   CSN: [id number] Arrival date & time: 07/18/22  1240     History Chief Complaint  Patient presents with   Shortness of Breath    Katherine Larson is a 76 y.o. female patient with history of type 2 diabetes mellitus, chronic kidney disease, CAD, CHF with ejection fraction of 25% on most recent echo who presents to the emergency department with shortness of breath that started approximately 1 hour ago.  Patient denies any previous symptoms similar to this today.  Her shortness of breath is primarily with exertion.  She denies any weight gain and is actually lost a couple of pounds.  No chest pain, cough, fever, chills, abdominal pain, nausea, vomiting, diarrhea.   Shortness of Breath      Home Medications Prior to Admission medications   Medication Sig Start Date End Date Taking? Authorizing Provider  etanercept (ENBREL SURECLICK) 50 MG/ML injection INJECT 50 MG Subcutaneous ONCE WEEKLY for 30 days 07/17/22  Yes [provider]  albuterol (PROVENTIL HFA;VENTOLIN HFA) 108 (90 Base) MCG/ACT inhaler Take 2 puffs 3 times a day and every 4 hours as needed. 01/24/16   Rexene Alberts, MD  allopurinol (ZYLOPRIM) 300 MG tablet Take 300 mg by mouth daily.    [provider]  aspirin EC 81 MG EC tablet Take 1 tablet (81 mg total) by mouth daily. 05/03/16   Erlene Quan, PA-C  atorvastatin (LIPITOR) 80 MG tablet Take 80 mg by mouth daily.    [provider]  calcium carbonate (TUMS - DOSED IN MG ELEMENTAL CALCIUM) 500 MG chewable tablet Chew 1 tablet by mouth as needed for indigestion or heartburn.    [provider]  carvedilol (COREG) 6.25 MG tablet Take 1.5 tablets (9.375 mg total) by mouth 2 (two) times daily. 02/21/22   Larey Dresser, MD  cholecalciferol (VITAMIN D3) 25 MCG (1000 UT) tablet Take 1,000 Units by mouth daily.    [provider]  diclofenac Sodium (VOLTAREN) 1 % GEL Apply topically. 05/02/22    [provider]  FARXIGA 10 MG TABS tablet TAKE 1 TABLET(10 MG) BY MOUTH DAILY BEFORE BREAKFAST 04/25/22   Larey Dresser, MD  ferrous sulfate 325 (65 FE) MG tablet Take 325 mg by mouth daily with breakfast. Every other day    [provider]  fexofenadine (ALLEGRA) 180 MG tablet Take 180 mg by mouth daily.    [provider]  fluticasone (FLONASE) 50 MCG/ACT nasal spray Place 2 sprays into both nostrils daily.    [provider]  furosemide (LASIX) 40 MG tablet 40 mg daily 06/23/22   Larey Dresser, MD  gabapentin (NEURONTIN) 400 MG capsule TAKE 1 CAPSULE(400 MG) BY MOUTH THREE TIMES DAILY 10/18/21   Pieter Partridge, DO  HYDROcodone-acetaminophen (NORCO) 10-325 MG tablet Take 1 tablet by mouth every 6 (six) hours as needed for moderate pain.    [provider]  icosapent Ethyl (VASCEPA) 1 g capsule Take 2 capsules (2 g total) by mouth 2 (two) times daily. 02/24/22   Larey Dresser, MD  Lancets (ONETOUCH DELICA PLUS [id number]) [place]  01/06/19   [provider]  leflunomide (ARAVA) 20 MG tablet Take 20 mg by mouth daily.    [provider]  losartan (COZAAR) 100 MG tablet Take 100 mg by mouth daily.    [provider]  MAGNESIUM-OXIDE 400 (240 Mg) MG tablet TAKE 1 TABLET(400 MG) BY MOUTH DAILY 12/12/21   Nishan,  Wallis Bamberg, MD  meclizine (ANTIVERT) 12.5 MG tablet Take 12.5 mg by mouth 3 (three) times daily as needed for dizziness.    [provider]  montelukast (SINGULAIR) 10 MG tablet Take 10 mg by mouth at bedtime.    [provider]  Multiple Vitamins-Minerals (OCUVITE ADULT 50+ PO) Take 1 tablet by mouth daily.    [provider]  nitroGLYCERIN (NITROSTAT) 0.4 MG SL tablet DISSOLVE 1 TABLET UNDER THE  TONGUE EVERY 5 MINUTES AS NEEDED FOR CHEST PAIN. MAX OF 3 TABLETS IN 15 MINUTES. CALL 911 IF PAIN  PERSISTS. 01/10/22   Evans Lance, MD  [facility] ULTRA test strip  12/30/21   [provider]   pantoprazole (PROTONIX) 40 MG tablet TAKE 1 TABLET BY MOUTH  TWICE DAILY BEFORE A MEAL 01/11/22   Annitta Needs, NP  spironolactone (ALDACTONE) 25 MG tablet Take 0.5 tablets (12.5 mg total) by mouth daily. 06/23/22   Larey Dresser, MD  TRELEGY Cleaster Corin 200-62.5-25 MCG/ACT AEPB Inhale 1 puff into the lungs daily. 09/16/21   [provider]      Allergies    Biaxin [clarithromycin], Penicillins, and Losartan potassium    Review of Systems   Review of Systems  Respiratory:  Positive for shortness of breath.   All other systems reviewed and are negative.   Physical Exam Updated Vital Signs BP 118/66   Pulse 77   Temp 98.1 F (36.7 C) (Oral)   Resp 17   Ht 5' 2.5" (1.588 m)   Wt 59.2 kg   SpO2 95%   BMI 23.51 kg/m  Physical Exam Vitals and nursing note reviewed.  Constitutional:      General: She is not in acute distress.    Appearance: Normal appearance.  HENT:     Head: Normocephalic and atraumatic.  Eyes:     General:        Right eye: No discharge.        Left eye: No discharge.  Cardiovascular:     Comments: Regular rate and rhythm.  S1/S2 are distinct without any evidence of murmur, rubs, or gallops.  Radial pulses are 2+ bilaterally.  Dorsalis pedis pulses are 2+ bilaterally.  No evidence of pedal edema. Pulmonary:     Effort: Tachypnea present.     Comments: Clear to auscultation bilaterally. No respiratory distress.  No evidence of wheezes, rales, or rhonchi heard throughout. Abdominal:     General: Abdomen is flat. Bowel sounds are normal. There is no distension.     Tenderness: There is no abdominal tenderness. There is no guarding or rebound.  Musculoskeletal:        General: Normal range of motion.     Cervical back: Neck supple.  Skin:    General: Skin is warm and dry.     Findings: No rash.  Neurological:     General: No focal deficit present.     Mental Status: She is alert.  Psychiatric:        Mood and Affect: Mood normal.         Behavior: Behavior normal.     ED Results / Procedures / Treatments   Labs (all labs ordered are listed, but only abnormal results are displayed) Labs Reviewed  BRAIN NATRIURETIC PEPTIDE - Abnormal; Notable for the following components:      Result Value   B Natriuretic Peptide 255.0 (*)    All other components within normal limits  BASIC METABOLIC PANEL - Abnormal; Notable for  the following components:   Chloride 97 (*)    Glucose, Bld 212 (*)    BUN 33 (*)    Creatinine, Ser 1.27 (*)    GFR, Estimated 44 (*)    All other components within normal limits  CBC WITH DIFFERENTIAL/PLATELET - Abnormal; Notable for the following components:   WBC 11.9 (*)    RBC 3.38 (*)    Hemoglobin 11.0 (*)    HCT 34.2 (*)    MCV 101.2 (*)    RDW 15.9 (*)    Neutro Abs 8.4 (*)    Abs Immature Granulocytes 0.08 (*)    All other components within normal limits  RESP PANEL BY RT-PCR (FLU A&B, COVID) [id number]  TROPONIN I (HIGH SENSITIVITY)  TROPONIN I (HIGH SENSITIVITY)    EKG None  Radiology DG Chest 2 View  Result Date: 07/18/2022 CLINICAL DATA:  Chest pain and shortness of breath. EXAM: CHEST - 2 VIEW COMPARISON:  CT 03/20/2022.  Radiographs 08/02/2021 and 06/23/2021. FINDINGS: Left subclavian AICD lead appears unchanged, projecting over the right ventricular apex. The heart size and mediastinal contours are stable status post median sternotomy and CABG. Stable mild scarring at the left lung base. No evidence of edema, confluent airspace opacity, pleural effusion or pneumothorax. No acute osseous findings are evident. Thoracic spinal stimulator, chronic lower thoracic compression deformity and postoperative changes in the lumbar spine are grossly stable IMPRESSION: Stable radiographic appearance of the chest. No evidence of acute cardiopulmonary process. Electronically Signed   By: Richardean Sale M.D.   On: 07/18/2022 13:20    Procedures Procedures    Medications Ordered in ED Medications -  No data to display  ED Course/ Medical Decision Making/ A&P Clinical Course as of 07/19/22 0955  Tue Jul 18, 2022  1729 CBC with Differential(!) There is evidence of leukocytosis and anemia.  These seem to be at baseline for the patient in comparison to previous results. [CF]  8841 Basic metabolic panel(!) No significant electrolyte abnormalities apart from hypochloremia.  There is elevated glucose and elevated creatinine.  This seems to be at the baseline for patient. [CF]  6606 Brain natriuretic peptide(!) BNP is elevated but better than previous results.  She does not appear to be clinically volume up today. [CF]  1730 Troponin I (High Sensitivity) Initial delta troponin are normal. [CF]  1731 Resp Panel by RT-PCR (Flu A&B, Covid) Anterior Nasal Swab Negative. [CF]  3016 DG Chest 2 View I personally ordered and interpreted this study and do not see any evidence of pleural effusion or pneumonia.  I do agree with the radiologist interpretation. [CF]    Clinical Course User Index [CF] Hendricks Limes, PA-C                           Medical Decision Making BELVIE IRIBE is a 76 y.o. female patient who presents to the emergency department today for further evaluation of shortness of breath.  Given her history of heart failure with ejection fraction of 25%, I am concerned for possible CHF.  Clinically, she does not appear that volume overloaded.  Will get BNP in addition to cardiac labs and a respiratory panel to further evaluate for COVID.  I will plan to reassess.  Patient is slightly tachypneic but is 100% O2 saturation on room air.  No evidence of ACS, pneumonia today.  Patient's BNP is elevated but she does not appear to be clinically volume overloaded.  Patient  ambulated without oxygen and maintained good oxygen saturations.  I feel the patient is likely safe for discharge to follow-up with her primary care doctor.  They are amenable this plan.  She is safe for discharge at this time.   Strict return precautions discussed.  Amount and/or Complexity of Data Reviewed Labs: ordered. Decision-making details documented in ED Course. Radiology: ordered. Decision-making details documented in ED Course.   Final Clinical Impression(s) / ED Diagnoses Final diagnoses:  Shortness of breath    Rx / DC Orders ED Discharge Orders     None         Hendricks Limes, [place] 07/19/22 6734    Sherwood Gambler, MD 07/19/22 [phone]

## 2022-07-18 NOTE — Discharge Instructions (Addendum)
Please follow-up with your primary care doctor for further evaluation.  There is no evidence of heart attack today.  Your chest x-ray did not reveal any signs of pneumonia.  Please return to the emergency department for any worsening symptoms that you might have.

## 2022-07-18 NOTE — ED Triage Notes (Signed)
Pt presence with SOB that started this am as she was walking across the room.

## 2022-07-18 NOTE — ED Notes (Signed)
Pt ambulated on room air. Oxygen saturation maintained upper 90's. (96-97%). Pt denied shortness of breath, dizziness, lightheadedness, and complaints of chest pain while walking.

## 2022-07-19 DIAGNOSIS — M47812 Spondylosis without myelopathy or radiculopathy, cervical region: Secondary | ICD-10-CM | POA: Diagnosis not present

## 2022-07-19 DIAGNOSIS — Z9689 Presence of other specified functional implants: Secondary | ICD-10-CM | POA: Diagnosis not present

## 2022-07-19 DIAGNOSIS — M961 Postlaminectomy syndrome, not elsewhere classified: Secondary | ICD-10-CM | POA: Diagnosis not present

## 2022-07-21 ENCOUNTER — Telehealth (HOSPITAL_COMMUNITY): Payer: Self-pay | Admitting: *Deleted

## 2022-07-21 ENCOUNTER — Other Ambulatory Visit: Payer: Self-pay

## 2022-07-21 ENCOUNTER — Emergency Department (HOSPITAL_COMMUNITY)
Admission: EM | Admit: 2022-07-21 | Discharge: 2022-07-21 | Disposition: A | Discharge status: Home / Self Care | Payer: Medicare Other | Attending: Emergency Medicine | Admitting: Emergency Medicine

## 2022-07-21 ENCOUNTER — Emergency Department (HOSPITAL_COMMUNITY): Payer: Medicare Other

## 2022-07-21 DIAGNOSIS — R350 Frequency of micturition: Secondary | ICD-10-CM | POA: Insufficient documentation

## 2022-07-21 DIAGNOSIS — R109 Unspecified abdominal pain: Secondary | ICD-10-CM | POA: Insufficient documentation

## 2022-07-21 DIAGNOSIS — R0602 Shortness of breath: Secondary | ICD-10-CM | POA: Insufficient documentation

## 2022-07-21 DIAGNOSIS — Z79899 Other long term (current) drug therapy: Secondary | ICD-10-CM | POA: Diagnosis not present

## 2022-07-21 DIAGNOSIS — Z7982 Long term (current) use of aspirin: Secondary | ICD-10-CM | POA: Diagnosis not present

## 2022-07-21 LAB — BASIC METABOLIC PANEL
Anion gap: 12 (ref 5–15)
BUN: 27 mg/dL — ABNORMAL HIGH (ref 8–23)
CO2: 26 mmol/L (ref 22–32)
Calcium: 9 mg/dL (ref 8.9–10.3)
Chloride: 98 mmol/L (ref 98–111)
Creatinine, Ser: 1.16 mg/dL — ABNORMAL HIGH (ref 0.44–1.00)
GFR, Estimated: 49 mL/min — ABNORMAL LOW (ref >60)
Glucose, Bld: 137 mg/dL — ABNORMAL HIGH (ref 70–99)
Potassium: 3.9 mmol/L (ref 3.5–5.1)
Sodium: 136 mmol/L (ref 135–145)

## 2022-07-21 LAB — URINALYSIS, COMPLETE (UACMP) WITH MICROSCOPIC
Bilirubin Urine: NEGATIVE
Glucose, UA: 150 mg/dL — AB
Hgb urine dipstick: NEGATIVE
Ketones, ur: NEGATIVE mg/dL
Nitrite: NEGATIVE
Protein, ur: NEGATIVE mg/dL
Specific Gravity, Urine: 1.005 (ref 1.005–1.030)
pH: 7 (ref 5.0–8.0)

## 2022-07-21 LAB — CBC WITH DIFFERENTIAL/PLATELET
Abs Immature Granulocytes: 0.06 10*3/uL (ref 0.00–0.07)
Basophils Absolute: 0.1 10*3/uL (ref 0.0–0.1)
Basophils Relative: 1 %
Eosinophils Absolute: 0.2 10*3/uL (ref 0.0–0.5)
Eosinophils Relative: 2 %
HCT: 30.9 % — ABNORMAL LOW (ref 36.0–46.0)
Hemoglobin: 10.1 g/dL — ABNORMAL LOW (ref 12.0–15.0)
Immature Granulocytes: 1 %
Lymphocytes Relative: 24 %
Lymphs Abs: 2.6 10*3/uL (ref 0.7–4.0)
MCH: 32.9 pg (ref 26.0–34.0)
MCHC: 32.7 g/dL (ref 30.0–36.0)
MCV: 100.7 fL — ABNORMAL HIGH (ref 80.0–100.0)
Monocytes Absolute: 0.7 10*3/uL (ref 0.1–1.0)
Monocytes Relative: 6 %
Neutro Abs: 7.3 10*3/uL (ref 1.7–7.7)
Neutrophils Relative %: 66 %
Platelets: 205 10*3/uL (ref 150–400)
RBC: 3.07 MIL/uL — ABNORMAL LOW (ref 3.87–5.11)
RDW: 15.6 % — ABNORMAL HIGH (ref 11.5–15.5)
WBC: 10.8 10*3/uL — ABNORMAL HIGH (ref 4.0–10.5)
nRBC: 0 % (ref 0.0–0.2)

## 2022-07-21 LAB — BRAIN NATRIURETIC PEPTIDE: B Natriuretic Peptide: 273.1 pg/mL — ABNORMAL HIGH (ref 0.0–100.0)

## 2022-07-21 MED ORDER — FUROSEMIDE 10 MG/ML IJ SOLN
40.0000 mg | Freq: Once | INTRAMUSCULAR | Status: AC
  Administered 2022-07-21: 40 mg via INTRAVENOUS
  Filled 2022-07-21: qty 4

## 2022-07-21 NOTE — ED Triage Notes (Signed)
Pt reports she was sent here for IV lasix. Pt reports ongoing DOE. Recent cut in Lasix dose and reports she doubles the dose when she is having these problems but this time it did not help.

## 2022-07-21 NOTE — Telephone Encounter (Signed)
Pt called c/o swelling and low urine output. Pt said she is in a lot of pain. Per Dr.McLean advise pt to go to the emergency room.  Pt aware and agreeable.

## 2022-07-21 NOTE — ED Provider Notes (Signed)
Supervise resident visit.  Patient here for ongoing shortness of breath.  She has been on an increased dose of Lasix for last several days.  She still may be having some shortness of breath symptoms.  She is concerned for may be urine infection.  She has some lower abdominal discomfort but on exam she has no real abdominal tenderness.  She has normal vitals.  No fever.  Blood work has already been done including a CBC, BNP, chest x-ray, EKG.  Per my review and interpretation of EKG there is no signs of ischemic changes.  Unchanged from prior EKGs.  She is very well-appearing.  No signs of respiratory distress.  No signs of volume overload.  Chest x-ray per my review and interpretation shows no evidence of pneumonia pneumothorax or edema.  Her BNP is in the 200s and overall pretty unremarkable.  Her blood work otherwise with no significant findings.  We will give a dose of IV Lasix and check a urine as she is having some urinary symptoms.  Anticipate discharge to home and will have her continue her Lasix at home and have her follow-up with her primary care team/cardiology team.  I have no concern for PE or other acute process at this time.  This chart was dictated using voice recognition software.  Despite best efforts to proofread,  errors can occur which can change the documentation meaning.    Lennice Sites, DO 07/21/22 1717

## 2022-07-21 NOTE — ED Provider Triage Note (Signed)
Emergency Medicine Provider Triage Evaluation Note  Katherine Larson , a 76 y.o. female  was evaluated in triage.  Pt complains of shortness of breath x 3 days. Seen at [facility] on Tuesday, was told everything was normal and double dose of lasix. More sob today, worse with exertion no relieve and has urinated very little. Called PCP who recommended eval in the ED. Some vague abdominal pain.  Review of Systems  Positive: Sob, abdominal pain Negative: Fever, cough, cp  Physical Exam  BP 123/67 (BP Location: Right Arm)   Pulse 87   Temp 98.5 F (36.9 C) (Oral)   Resp 16   SpO2 97%  Gen:   Awake, no distress   Resp:  Normal effort  MSK:   Moves extremities without difficulty  Other:    Medical Decision Making  Medically screening exam initiated at 12:19 PM.  Appropriate orders placed.  Katherine Larson was informed that the remainder of the evaluation will be completed by another provider, this initial triage assessment does not replace that evaluation, and the importance of remaining in the ED until their evaluation is complete.     [name], PA-C 07/21/22 1223

## 2022-07-21 NOTE — Discharge Instructions (Signed)
Your work-up today was reassuring.  You do not appear to have a urinary infection.  Follow-up with your primary doctors.  Continue your home medications, including your Lasix.  Return to the ED as needed.

## 2022-07-21 NOTE — ED Provider Notes (Signed)
[facility] EMERGENCY DEPARTMENT Provider Note   CSN: [id number] Arrival date & time: 07/21/22  1209     History  Chief Complaint  Patient presents with   Shortness of Breath    Katherine Larson is a 76 y.o. female. Presenting with shortness of breath, orthopnea, and decreased urine output.  Reports that she feels urinary urgency and frequency, but does not feel like she is producing as much urine as usual.  She reports compliance with her p.o. Lasix at home.  She was seen in the ED for shortness of breath 3 days ago and had reassuring evaluation at that time.  She called her doctor today reporting the urinary symptoms and they sent her to the ED. Denies fever, cough, chest pain.   Shortness of Breath Associated symptoms: abdominal pain   Associated symptoms: no chest pain        Home Medications Prior to Admission medications   Medication Sig Start Date End Date Taking? Authorizing Provider  albuterol (PROVENTIL HFA;VENTOLIN HFA) 108 (90 Base) MCG/ACT inhaler Take 2 puffs 3 times a day and every 4 hours as needed. 01/24/16   Rexene Alberts, MD  allopurinol (ZYLOPRIM) 300 MG tablet Take 300 mg by mouth daily.    [provider]  aspirin EC 81 MG EC tablet Take 1 tablet (81 mg total) by mouth daily. 05/03/16   Erlene Quan, PA-C  atorvastatin (LIPITOR) 80 MG tablet Take 80 mg by mouth daily.    [provider]  calcium carbonate (TUMS - DOSED IN MG ELEMENTAL CALCIUM) 500 MG chewable tablet Chew 1 tablet by mouth as needed for indigestion or heartburn.    [provider]  carvedilol (COREG) 6.25 MG tablet Take 1.5 tablets (9.375 mg total) by mouth 2 (two) times daily. 02/21/22   Larey Dresser, MD  cholecalciferol (VITAMIN D3) 25 MCG (1000 UT) tablet Take 1,000 Units by mouth daily.    [provider]  diclofenac Sodium (VOLTAREN) 1 % GEL Apply topically. 05/02/22   [provider]  etanercept (ENBREL SURECLICK) 50 MG/ML  injection INJECT 50 MG Subcutaneous ONCE WEEKLY for 30 days 07/17/22   [provider]  FARXIGA 10 MG TABS tablet TAKE 1 TABLET(10 MG) BY MOUTH DAILY BEFORE BREAKFAST 04/25/22   Larey Dresser, MD  ferrous sulfate 325 (65 FE) MG tablet Take 325 mg by mouth daily with breakfast. Every other day    [provider]  fexofenadine (ALLEGRA) 180 MG tablet Take 180 mg by mouth daily.    [provider]  fluticasone (FLONASE) 50 MCG/ACT nasal spray Place 2 sprays into both nostrils daily.    [provider]  furosemide (LASIX) 40 MG tablet 40 mg daily 06/23/22   Larey Dresser, MD  gabapentin (NEURONTIN) 400 MG capsule TAKE 1 CAPSULE(400 MG) BY MOUTH THREE TIMES DAILY 10/18/21   Pieter Partridge, DO  HYDROcodone-acetaminophen (NORCO) 10-325 MG tablet Take 1 tablet by mouth every 6 (six) hours as needed for moderate pain.    [provider]  icosapent Ethyl (VASCEPA) 1 g capsule Take 2 capsules (2 g total) by mouth 2 (two) times daily. 02/24/22   Larey Dresser, MD  Lancets (ONETOUCH DELICA PLUS [id number]) [place]  01/06/19   [provider]  leflunomide (ARAVA) 20 MG tablet Take 20 mg by mouth daily.    [provider]  losartan (COZAAR) 100 MG tablet Take 100 mg by mouth daily.    [provider]  MAGNESIUM-OXIDE 400 (240 Mg) MG tablet TAKE 1 TABLET(400 MG) BY MOUTH DAILY 12/12/21   Josue Hector, MD  meclizine (ANTIVERT) 12.5 MG tablet Take 12.5 mg by mouth 3 (three) times daily as needed for dizziness.    [provider]  montelukast (SINGULAIR) 10 MG tablet Take 10 mg by mouth at bedtime.    [provider]  Multiple Vitamins-Minerals (OCUVITE ADULT 50+ PO) Take 1 tablet by mouth daily.    [provider]  nitroGLYCERIN (NITROSTAT) 0.4 MG SL tablet DISSOLVE 1 TABLET UNDER THE  TONGUE EVERY 5 MINUTES AS NEEDED FOR CHEST PAIN. MAX OF 3 TABLETS IN 15 MINUTES. CALL 911 IF PAIN  PERSISTS. 01/10/22   Evans Lance, MD   [facility] ULTRA test strip  12/30/21   [provider]  pantoprazole (PROTONIX) 40 MG tablet TAKE 1 TABLET BY MOUTH  TWICE DAILY BEFORE A MEAL 01/11/22   Annitta Needs, NP  spironolactone (ALDACTONE) 25 MG tablet Take 0.5 tablets (12.5 mg total) by mouth daily. 06/23/22   Larey Dresser, MD  TRELEGY Cleaster [place]in 200-62.5-25 MCG/ACT AEPB Inhale 1 puff into the lungs daily. 09/16/21   [provider]      Allergies    Biaxin [clarithromycin], Penicillins, and Losartan potassium    Review of Systems   Review of Systems  Respiratory:  Positive for shortness of breath.   Cardiovascular:  Negative for chest pain and leg swelling.  Gastrointestinal:  Positive for abdominal distention and abdominal pain.  Genitourinary:  Positive for decreased urine volume and urgency. Negative for dysuria and frequency.    Physical Exam Updated Vital Signs BP (!) 116/48   Pulse 76   Temp 98.3 F (36.8 C)   Resp 13   SpO2 97%  Physical Exam Vitals and nursing note reviewed.  Constitutional:      General: She is not in acute distress.    Appearance: She is well-developed.  HENT:     Head: Normocephalic and atraumatic.  Eyes:     Conjunctiva/sclera: Conjunctivae normal.  Cardiovascular:     Rate and Rhythm: Normal rate and regular rhythm.     Heart sounds: No murmur heard. Pulmonary:     Effort: Pulmonary effort is normal. No tachypnea, accessory muscle usage or respiratory distress.     Breath sounds: Normal breath sounds.  Chest:     Chest wall: No tenderness.  Abdominal:     Palpations: Abdomen is soft.     Tenderness: There is abdominal tenderness (suprapubic discomfort).  Musculoskeletal:        General: No swelling.     Cervical back: Neck supple.     Right lower leg: No edema.     Left lower leg: No edema.  Skin:    General: Skin is warm and dry.     Capillary Refill: Capillary refill takes less than 2 seconds.     Coloration: Skin is not pale.  Neurological:     Mental  Status: She is alert.  Psychiatric:        Mood and Affect: Mood normal.     ED Results / Procedures / Treatments   Labs (all labs ordered are listed, but only abnormal results are displayed) Labs Reviewed  BASIC METABOLIC PANEL - Abnormal; Notable for the following components:      Result Value   Glucose, Bld 137 (*)    BUN 27 (*)    Creatinine, Ser 1.16 (*)    GFR, Estimated 49 (*)  All other components within normal limits  CBC WITH DIFFERENTIAL/PLATELET - Abnormal; Notable for the following components:   WBC 10.8 (*)    RBC 3.07 (*)    Hemoglobin 10.1 (*)    HCT 30.9 (*)    MCV 100.7 (*)    RDW 15.6 (*)    All other components within normal limits  BRAIN NATRIURETIC PEPTIDE - Abnormal; Notable for the following components:   B Natriuretic Peptide 273.1 (*)    All other components within normal limits  URINALYSIS, COMPLETE (UACMP) WITH MICROSCOPIC - Abnormal; Notable for the following components:   Color, Urine STRAW (*)    Glucose, UA 150 (*)    Leukocytes,Ua TRACE (*)    Bacteria, UA RARE (*)    All other components within normal limits    EKG EKG Interpretation  Date/Time:  Friday July 21 2022 12:28:32 EDT Ventricular Rate:  81 PR Interval:  166 QRS Duration: 144 QT Interval:  432 QTC Calculation: 501 R Axis:   107 Text Interpretation: Rightward axis Non-specific intra-ventricular conduction block T wave abnormality, consider inferolateral ischemia Abnormal ECG No significant change since last tracing When compared with ECG of 18-Jul-2022 12:58, PREVIOUS ECG IS PRESENT Confirmed by Lennice Sites ([id number]) on 07/21/2022 4:53:09 PM  Radiology DG Chest 2 View  Result Date: 07/21/2022 CLINICAL DATA:  Shortness of breath. EXAM: CHEST - 2 VIEW COMPARISON:  Chest radiograph July 18, 2022 FINDINGS: Single lead AICD device overlies the left hemithorax. Stable cardiac and mediastinal contours status post median sternotomy. Spinal stimulator device. Aortic athero  sclerosis. Basilar heterogeneous opacities. No pleural effusion or pneumothorax. Thoracic spine degenerative changes. Unchanged lower thoracic spine wedge compression deformity. IMPRESSION: Bibasilar atelectasis/scarring. Electronically Signed   By: Lovey Newcomer M.D.   On: 07/21/2022 12:49    Procedures Procedures    Medications Ordered in ED Medications  furosemide (LASIX) injection 40 mg (40 mg Intravenous Given 07/21/22 1715)    ED Course/ Medical Decision Making/ A&P                           Medical Decision Making Risk Prescription drug management.   76 year old female with past medical history of CHF presenting with shortness of breath x3 days and decreased urine output x1 day.  Denies chest pain. Most recent echo 07/2021 with EF estimated to be 25 to 30%. ICD most recently checked earlier this month 06/2022 and showed to have normal device function.   Differential diagnosis includes heart failure exacerbation, pulmonary edema, fluid overload, UTI, pyelonephritis, PE, pneumonia, pleural effusion.   PE considered, however Wells low risk.  Additionally, patient is neither tachypneic, hypoxic, nor tachycardic.  Labs reviewed and overall reassuring.  Hemoglobin stable at 10.1.  Creatinine stable at 1.16.  No significant Electra abnormalities.  BNP mildly elevated to 273.  This is similar to her prior presentations, most recently 3 days ago.  Chest x-ray reviewed and found to be reassuring, no signs of significant pulmonary edema, focal opacity/pneumonia, or pneumothorax.  EKG reviewed and showed a nonspecific block.  Appears similar to prior EKGs in our system.  Patient given 1 dose of IV Lasix.  She had appropriate urine output.  Urinalysis assuring and not consistent with infection.  Overall, patient has a reassuring physical exam and vital signs.  She does not appear to be clinically fluid overloaded.  I discussed the above results with the patient.  All questions answered.   Recommended continuation of home medications  including Lasix.  Patient will follow-up closely with her cardiologist in clinic.  Return precautions given.         Final Clinical Impression(s) / ED Diagnoses Final diagnoses:  Urinary frequency  SOB (shortness of breath)    Rx / DC Orders ED Discharge Orders     None         Rosine Abe, MD 07/21/22 [place], [place], DO 07/21/22 2358

## 2022-07-26 ENCOUNTER — Encounter: Payer: Self-pay | Admitting: Gastroenterology

## 2022-07-26 NOTE — Progress Notes (Signed)
Primary Care Physician:  Harlan Stains, MD  Primary GI: Dr. Gala Romney  Patient Location: Home   Provider Location: [facility] office   Reason for Visit: Acute visit for nausea   Persons present on the virtual encounter, with roles: Aliene Altes, PA-C (Provider), Katherine Larson (patient)   Total time (minutes) spent on medical discussion: 10 minutes  Virtual Visit via video Note Due to COVID-19, visit is conducted virtually and was requested by patient.   I connected with Katherine Larson on 07/27/22 at  1:00 PM EDT by video and verified that I am speaking with the correct person using two identifiers.   I discussed the limitations, risks, security and privacy concerns of performing an evaluation and management service by video and the availability of in person appointments. I also discussed with the patient that there may be a patient responsible charge related to this service. The patient expressed understanding and agreed to proceed.  Chief Complaint  Patient presents with   Follow-up    Nauseated during the day       History of Present Illness: Katherine Larson is a 76 y.o. female with GI history of dysphagia in the setting of esophageal dysmotility, GERD, lymphocytic colitis, chronic anemia with IDA followed by hematology with differential favoring anemia of chronic disease with functional iron deficiency, may have some anemia related to leflunomide as well, presenting today with chief complaint of nausea.   Last seen in our office 05/17/22. Constipation well controlled with 2 stool softeners daily.  GERD well controlled on Protonix twice daily, not tolerating once daily dosing.  Dysphagia well controlled with dietary/lifestyle modification.  Weight has been stable over the last 6 months as she was being more intentional about her oral intake.  Reported prior early satiety had resolved.  No other significant GI symptoms.  She was advised to continue her current medications and follow-up in  6 months or sooner if needed.  Today:  Nauseated. Started about 1 month ago. Daily. No vomiting. Not triggered by eating. Can wake up in the night feeling nauseated. No medication changes. Heartburn daily after eating. This has also been worsening for the last month or so. Heartburn is the worse at night. Taking pantoprazole 40 mg BID. Has been on this for years. Previously on omeprazole. No abdominal pain. No brbpr or melena. Admits to early satiety. Weight fluctuates within a few lbs, but overall stable. Feels fluctuations come from changes in fluid retention. Bowels moving well.    No NSAIDs aside from 81 mg aspirin.   Past Medical History:  Diagnosis Date   Anemia    anemia of chronic disease +/- IDA followed by Dr. Earlie Server. received iron infusions in the past   Asthma    Borderline glaucoma    CAD (coronary artery disease)    a. s/p CABG 1996. b. low risk nuc 2011. c. LHC 04/2016 due to drop in EF -> occluded native LAD,  widely patent sequential LIMA-D1-LAD.   Chronic kidney disease    "mild stage of kidney disease"   Chronic systolic CHF (congestive heart failure) (HCC)    Complication of anesthesia    DM2 (diabetes mellitus, type 2) ([place])    not on any medicine for this at this time, 05/2016   Dyspnea    with exertion   GERD (gastroesophageal reflux disease)    Gout    History of blood transfusion    History of hiatal hernia    in 20s   History of  pneumonia    March, 2016   HLD (hyperlipidemia)    HTN (hypertension)    Hyperthyroidism 01/21/2016   Inappropriate sinus tachycardia    LBBB (left bundle branch block)    a. Seen in 04/2016   Lymphocytic colitis 04/2020   Macular degeneration    left   Myocardial infarction [facility])    1996   Neuropathy    Neuropathy    NICM (nonischemic cardiomyopathy) ([place])    a. Dx 04/2016 - EF 25-30%, diffuse HK, elevated LVEDP, mild MR, mod LAE.   Normocytic anemia 10/17/2021   NSVT (nonsustained ventricular tachycardia) (HCC)    PAT  (paroxysmal atrial tachycardia) (HCC)    Pneumonia    PONV (postoperative nausea and vomiting)    "after just about every surgery I've had"   Rheumatoid arthritis ([place])    RA- hands   Seasonal allergies      Past Surgical History:  Procedure Laterality Date   ABDOMINAL HYSTERECTOMY     APPENDECTOMY     BACK SURGERY     BIOPSY  04/27/2020   Procedure: BIOPSY;  Surgeon: Daneil Dolin, MD;  [facility];  Service: Endoscopy;;  ascending colon biopsy   CARDIAC CATHETERIZATION N/A 05/01/2016   Procedure: Right/Left Heart Cath and Coronary/Graft Angiography;  Surgeon: Leonie Man, MD;  Location: [place] CV LAB;  Service: Cardiovascular;  Laterality: N/A;   CHOLECYSTECTOMY     COLONOSCOPY  11/2011   Dr. Magod:ext/int hemorrhoids, diverticulosis sigmoid colon and distal desc colon, mid-desc colon hyperplastic polyps.    COLONOSCOPY WITH PROPOFOL N/A 04/27/2020   Rourk: Diverticulosis, hemorrhoids, normal terminal ileum.  Random colon biopsies significant for chronic lymphocytic colitis. No repeat due to age.   CORONARY ARTERY BYPASS GRAFT  1996   2 vessels   ESOPHAGOGASTRODUODENOSCOPY  11/2011   Dr. Watt Climes: small hiatal hernia, one non-bleeding superficial gastric ulcer, medium-sized diverticulum in area of papilla   ESOPHAGOGASTRODUODENOSCOPY N/A 12/29/2015   Dr. Gala Romney: Chronic inactive gastritis, normal esophagus status post dilation, duodenal diverticula   ESOPHAGOGASTRODUODENOSCOPY (EGD) WITH PROPOFOL N/A 07/24/2019   Dr. Gala Romney: Normal esophagus status post empiric dilation, small hiatal hernia, large D2 diverticulum   ESOPHAGOGASTRODUODENOSCOPY (EGD) WITH PROPOFOL N/A 06/03/2021   Surgeon: Daneil Dolin, MD; normal esophagus, normal stomach, normal duodenum.  Abnormal hypopharyngeal mucosa bilaterally just distal to the base of the tongue (right side greater than left), overlying mucosa appeared irregular and somewhat verrucous in appearance.  Recommended ENT  evaluation.   EYE SURGERY Bilateral    cataract removal   FOOT SURGERY     removal bone spur   FRACTURE SURGERY Right    arm   GIVENS CAPSULE STUDY  11/2012   Dr. Watt Climes: minimal gastritis, normal small bowel capsule   HERNIA REPAIR     hiatal hernia   ICD IMPLANT N/A 02/04/2021   Procedure: ICD IMPLANT;  Surgeon: Evans Lance, MD;  Location: [place] CV LAB;  Service: Cardiovascular;  Laterality: N/A;   MALONEY DILATION N/A 07/24/2019   Procedure: Venia Minks DILATION;  Surgeon: Daneil Dolin, MD;  [facility];  Service: Endoscopy;  Laterality: N/A;   MALONEY DILATION N/A 06/03/2021   Procedure: Venia Minks DILATION;  Surgeon: Daneil Dolin, MD;  [facility];  Service: Endoscopy;  Laterality: N/A;   MAXIMUM ACCESS (MAS)POSTERIOR LUMBAR INTERBODY FUSION (PLIF) 1 LEVEL N/A 08/14/2013   Procedure:  MAXIMUM ACCESS SURGERY(MAS) POSTERIOR LUMBAR INTERBODY FUSION LUMBAR THREE-FOUR ;  Surgeon: Eustace Moore,  MD;  Location: [place] [facility];  Service: Neurosurgery;  Laterality: N/A;   MAXIMUM ACCESS SURGERY(MAS) POSTERIOR LUMBAR INTERBODY FUSION LUMBAR THREE-FOUR    PTCA     RIGHT/LEFT HEART CATH AND CORONARY ANGIOGRAPHY N/A 03/26/2020   Procedure: RIGHT/LEFT HEART CATH AND CORONARY ANGIOGRAPHY;  Surgeon: Leonie Man, MD;  Location: [place] CV LAB;  Service: Cardiovascular;  Laterality: N/A;   SPINAL CORD STIMULATOR BATTERY EXCHANGE N/A 01/19/2021   Procedure: Spinal cord stimulator battery change;  Surgeon: Reece Agar, MD;  Location: [place];  Service: Neurosurgery;  Laterality: N/A;   SPINAL CORD STIMULATOR INSERTION N/A 06/16/2016   Procedure: LUMBAR SPINAL CORD STIMULATOR INSERTION;  Surgeon: Clydell Hakim, MD;  Location: [place] [facility];  Service: Neurosurgery;  Laterality: N/A;  LUMBAR SPINAL CORD STIMULATOR INSERTION   tens  05/2016   TONSILLECTOMY       Current Meds  Medication Sig   albuterol (PROVENTIL HFA;VENTOLIN HFA) 108 (90 Base) MCG/ACT inhaler  Take 2 puffs 3 times a day and every 4 hours as needed.   aspirin EC 81 MG EC tablet Take 1 tablet (81 mg total) by mouth daily.   atorvastatin (LIPITOR) 80 MG tablet Take 80 mg by mouth daily.   calcium carbonate (TUMS - DOSED IN MG ELEMENTAL CALCIUM) 500 MG chewable tablet Chew 1 tablet by mouth as needed for indigestion or heartburn.   carvedilol (COREG) 6.25 MG tablet Take 1.5 tablets (9.375 mg total) by mouth 2 (two) times daily.   cholecalciferol (VITAMIN D3) 25 MCG (1000 UT) tablet Take 1,000 Units by mouth daily.   dexlansoprazole (DEXILANT) 60 MG capsule Take 1 capsule (60 mg total) by mouth daily.   diclofenac Sodium (VOLTAREN) 1 % GEL Apply topically.   FARXIGA 10 MG TABS tablet TAKE 1 TABLET(10 MG) BY MOUTH DAILY BEFORE BREAKFAST   ferrous sulfate 325 (65 FE) MG tablet Take 325 mg by mouth daily with breakfast. Every other day   fexofenadine (ALLEGRA) 180 MG tablet Take 180 mg by mouth daily.   fluticasone (FLONASE) 50 MCG/ACT nasal spray Place 2 sprays into both nostrils daily.   furosemide (LASIX) 40 MG tablet 40 mg daily   gabapentin (NEURONTIN) 400 MG capsule TAKE 1 CAPSULE(400 MG) BY MOUTH THREE TIMES DAILY   HYDROcodone-acetaminophen (NORCO) 10-325 MG tablet Take 1 tablet by mouth every 6 (six) hours as needed for moderate pain.   icosapent Ethyl (VASCEPA) 1 g capsule Take 2 capsules (2 g total) by mouth 2 (two) times daily.   Lancets (ONETOUCH DELICA PLUS HYWVPX10G) MISC    leflunomide (ARAVA) 20 MG tablet Take 20 mg by mouth daily.   losartan (COZAAR) 100 MG tablet Take 100 mg by mouth daily.   MAGNESIUM-OXIDE 400 (240 Mg) MG tablet TAKE 1 TABLET(400 MG) BY MOUTH DAILY   meclizine (ANTIVERT) 12.5 MG tablet Take 12.5 mg by mouth 3 (three) times daily as needed for dizziness.   Multiple Vitamins-Minerals (OCUVITE ADULT 50+ PO) Take 1 tablet by mouth daily.   ONETOUCH ULTRA test strip    spironolactone (ALDACTONE) 25 MG tablet Take 0.5 tablets (12.5 mg total) by mouth daily.    TRELEGY ELLIPTA 200-62.5-25 MCG/ACT AEPB Inhale 1 puff into the lungs daily.   [DISCONTINUED] pantoprazole (PROTONIX) 40 MG tablet TAKE 1 TABLET BY MOUTH  TWICE DAILY BEFORE A MEAL     Family History  Problem Relation Age of Onset   Heart attack Father    Heart attack Mother    Bladder Cancer Brother  Cervical cancer Daughter        ?   Colon cancer Neg Hx     Social History   Socioeconomic History   Marital status: Married    Spouse name: Not on file   Number of children: 3   Years of education: Not on file   Highest education level: Not on file  Occupational History   Not on file  Tobacco Use   Smoking status: Former    Packs/day: 0.75    Years: 15.00    Total pack years: 11.25    Types: Cigarettes    Quit date: 11/20/1994    Years since quitting: 27.7   Smokeless tobacco: Never   Tobacco comments:    Quit in 1996  Vaping Use   Vaping Use: Never used  Substance and Sexual Activity   Alcohol use: Never    Alcohol/week: 0.0 standard drinks of alcohol   Drug use: Never   Sexual activity: Yes  Other Topics Concern   Not on file  Social History Narrative   2 living children, one deceased age 16   Right handed   One story home   Drinks coffee am   Social Determinants of Health   Financial Resource Strain: Not on file  Food Insecurity: Not on file  Transportation Needs: Not on file  Physical Activity: Not on file  Stress: Not on file  Social Connections: Not on file      Review of Systems: Gen: Denies fever, chills, cold or flulike symptoms, presyncope, syncope. CV: Denies chest pain, palpitations. Resp: Denies dyspnea, cough.  GI: see HPI Heme: See HPI   Observations/Objective: No distress. Alert and oriented. Pleasant. Well nourished. Normal mood and affect. Unable to perform complete physical exam due to video encounter.     Assessment:  76 y.o. 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   Nausea:  Daily and occasionally waking at night with nausea without vomiting. No associated abdominal pain and symptoms are not triggered by meals. No recent medication changes. Query whether symptoms are secondary to uncontrolled GERD vs gastroparesis as she as been reporting early satiety for quite some time, but previously declined GES. Encouragingly, weight has been fairly stable.   GERD:  Chronic.  Not adequately controlled on pantoprazole 40 mg twice daily which she has been on for several years.  Previously on omeprazole.  We will try changing pantoprazole to Dexilant.    Plan: Gastric emptying study. Stop pantoprazole and start Dexilant 60 mg daily.  Requested patient to call in 3 to 4 weeks if she continues with frequent heartburn symptoms or nausea. GERD diet/lifestyle recommendations: Avoid fried, fatty, greasy, spicy, citrus foods. Avoid caffeine and carbonated beverages. Avoid chocolate. Try eating 4-6 small meals a day rather than 3 large meals. Do not eat within 3 hours of laying down. Prop head of bed up on wood or bricks to create a 6 inch incline. Follow-up in 3 months or sooner if needed.      I discussed the assessment and treatment plan with the patient. The patient was provided an opportunity to ask questions and all were answered. The patient agreed with the plan and demonstrated an understanding of the instructions.   The  patient was advised to call back or seek an in-person evaluation if the symptoms worsen or if the condition fails to improve as anticipated.  I provided 10 minutes of video-face-to-face time during this encounter.  Aliene Altes, PA-C [facility]   07/27/2022

## 2022-07-27 ENCOUNTER — Telehealth (INDEPENDENT_AMBULATORY_CARE_PROVIDER_SITE_OTHER): Payer: Medicare Other | Admitting: Gastroenterology

## 2022-07-27 ENCOUNTER — Telehealth: Payer: Self-pay | Admitting: *Deleted

## 2022-07-27 ENCOUNTER — Encounter: Payer: Self-pay | Admitting: Gastroenterology

## 2022-07-27 VITALS — Ht 62.0 in | Wt 130.0 lb

## 2022-07-27 DIAGNOSIS — R110 Nausea: Secondary | ICD-10-CM | POA: Diagnosis not present

## 2022-07-27 DIAGNOSIS — R6881 Early satiety: Secondary | ICD-10-CM | POA: Diagnosis not present

## 2022-07-27 DIAGNOSIS — K219 Gastro-esophageal reflux disease without esophagitis: Secondary | ICD-10-CM | POA: Diagnosis not present

## 2022-07-27 MED ORDER — DEXLANSOPRAZOLE 60 MG PO CPDR: 60.0000 mg | DELAYED_RELEASE_CAPSULE | Freq: Every day | ORAL | 3 refills | Status: DC

## 2022-07-27 NOTE — Patient Instructions (Signed)
We will arrange for you to have a gastric emptying study at [facility].   Stop pantoprazole and start Dexilant 60 mg daily.  I have sent a prescription to [facility] on [address]  Follow a GERD diet:  Avoid fried, fatty, greasy, spicy, citrus foods. Avoid caffeine and carbonated beverages. Avoid chocolate. Try eating 4-6 small meals a day rather than 3 large meals. Do not eat within 3 hours of laying down. Prop head of bed up on wood or bricks to create a 6 inch incline.  Please let me know if you continue with frequent heartburn symptoms or nausea after 3-4 weeks of taking Dexilant.   We will plan to follow-up in the office in about 3 months.  Do not hesitate to call sooner if you have questions or concerns.  Aliene Altes, PA-C [facility]

## 2022-07-27 NOTE — Telephone Encounter (Signed)
PA for GES approved via [employer] Josem Kaufmann #:[id number], DOS: 07/27/2022-09/10/2022   GES scheduled for 9/14, arrival 8am, npo midnight, no stomach meds that morning.  Called pt and made aware of appt details. She voiced understanding

## 2022-07-31 DIAGNOSIS — R519 Headache, unspecified: Secondary | ICD-10-CM | POA: Diagnosis not present

## 2022-07-31 DIAGNOSIS — I5022 Chronic systolic (congestive) heart failure: Secondary | ICD-10-CM | POA: Diagnosis not present

## 2022-07-31 DIAGNOSIS — J449 Chronic obstructive pulmonary disease, unspecified: Secondary | ICD-10-CM | POA: Diagnosis not present

## 2022-08-03 ENCOUNTER — Ambulatory Visit (HOSPITAL_COMMUNITY)
Admission: RE | Admit: 2022-08-03 | Discharge: 2022-08-03 | Disposition: A | Discharge status: Home / Self Care | Payer: Medicare Other | Source: Ambulatory Visit | Attending: Gastroenterology | Admitting: Gastroenterology

## 2022-08-03 ENCOUNTER — Encounter (HOSPITAL_COMMUNITY): Payer: Self-pay

## 2022-08-03 DIAGNOSIS — R6881 Early satiety: Secondary | ICD-10-CM | POA: Diagnosis not present

## 2022-08-03 DIAGNOSIS — R110 Nausea: Secondary | ICD-10-CM | POA: Insufficient documentation

## 2022-08-03 DIAGNOSIS — K30 Functional dyspepsia: Secondary | ICD-10-CM | POA: Insufficient documentation

## 2022-08-03 MED ORDER — TECHNETIUM TC 99M SULFUR COLLOID
2.0000 | Freq: Once | INTRAVENOUS | Status: AC | PRN
  Administered 2022-08-03: 2.2 via ORAL

## 2022-08-04 ENCOUNTER — Ambulatory Visit (INDEPENDENT_AMBULATORY_CARE_PROVIDER_SITE_OTHER): Payer: Medicare Other

## 2022-08-04 ENCOUNTER — Other Ambulatory Visit: Payer: Self-pay | Admitting: Gastroenterology

## 2022-08-04 DIAGNOSIS — I255 Ischemic cardiomyopathy: Secondary | ICD-10-CM | POA: Diagnosis not present

## 2022-08-04 DIAGNOSIS — K219 Gastro-esophageal reflux disease without esophagitis: Secondary | ICD-10-CM

## 2022-08-04 MED ORDER — LANSOPRAZOLE 30 MG PO CPDR: 30.0000 mg | DELAYED_RELEASE_CAPSULE | Freq: Two times a day (BID) | ORAL | 3 refills | Status: DC

## 2022-08-06 LAB — CUP PACEART REMOTE DEVICE CHECK
Battery Remaining Longevity: 104 mo
Battery Remaining Percentage: 85 %
Battery Voltage: 3.01 V
Brady Statistic RV Percent Paced: 1 %
Date Time Interrogation Session: 20230915020034
HighPow Impedance: 63 Ohm
Implantable Lead Implant Date: 20220318
Implantable Lead Location: 753860
Implantable Pulse Generator Implant Date: 20220318
Lead Channel Impedance Value: 450 Ohm
Lead Channel Pacing Threshold Amplitude: 0.75 V
Lead Channel Pacing Threshold Pulse Width: 0.5 ms
Lead Channel Sensing Intrinsic Amplitude: 12 mV
Lead Channel Setting Pacing Amplitude: 2.5 V
Lead Channel Setting Pacing Pulse Width: 0.5 ms
Lead Channel Setting Sensing Sensitivity: 0.5 mV
Pulse Gen Serial Number: 810021948

## 2022-08-07 ENCOUNTER — Telehealth: Payer: Self-pay

## 2022-08-07 NOTE — Telephone Encounter (Signed)
Refill request received from [facility] for Katherine Larson. Pt was last seen via video on 07/27/2022.

## 2022-08-07 NOTE — Telephone Encounter (Signed)
Dexilant is too expensive.  I have sent in a prescription for Prevacid 30 mg twice daily. Patient is already aware of this. See GES result note for details.

## 2022-08-10 DIAGNOSIS — J4540 Moderate persistent asthma, uncomplicated: Secondary | ICD-10-CM | POA: Diagnosis not present

## 2022-08-10 DIAGNOSIS — I5022 Chronic systolic (congestive) heart failure: Secondary | ICD-10-CM | POA: Diagnosis not present

## 2022-08-10 DIAGNOSIS — E785 Hyperlipidemia, unspecified: Secondary | ICD-10-CM | POA: Diagnosis not present

## 2022-08-10 DIAGNOSIS — E1140 Type 2 diabetes mellitus with diabetic neuropathy, unspecified: Secondary | ICD-10-CM | POA: Diagnosis not present

## 2022-08-10 DIAGNOSIS — J449 Chronic obstructive pulmonary disease, unspecified: Secondary | ICD-10-CM | POA: Diagnosis not present

## 2022-08-15 NOTE — Progress Notes (Signed)
Remote ICD transmission.   

## 2022-08-22 DIAGNOSIS — M4722 Other spondylosis with radiculopathy, cervical region: Secondary | ICD-10-CM | POA: Diagnosis not present

## 2022-09-08 ENCOUNTER — Inpatient Hospital Stay: Payer: Medicare Other

## 2022-09-11 ENCOUNTER — Inpatient Hospital Stay: Payer: Medicare Other | Attending: Hematology

## 2022-09-11 DIAGNOSIS — R1110 Vomiting, unspecified: Secondary | ICD-10-CM | POA: Diagnosis not present

## 2022-09-11 DIAGNOSIS — D72829 Elevated white blood cell count, unspecified: Secondary | ICD-10-CM | POA: Diagnosis not present

## 2022-09-11 DIAGNOSIS — D72828 Other elevated white blood cell count: Secondary | ICD-10-CM

## 2022-09-11 DIAGNOSIS — D509 Iron deficiency anemia, unspecified: Secondary | ICD-10-CM | POA: Insufficient documentation

## 2022-09-11 DIAGNOSIS — D631 Anemia in chronic kidney disease: Secondary | ICD-10-CM | POA: Insufficient documentation

## 2022-09-11 DIAGNOSIS — D649 Anemia, unspecified: Secondary | ICD-10-CM

## 2022-09-11 DIAGNOSIS — N1831 Chronic kidney disease, stage 3a: Secondary | ICD-10-CM | POA: Insufficient documentation

## 2022-09-11 DIAGNOSIS — J029 Acute pharyngitis, unspecified: Secondary | ICD-10-CM | POA: Diagnosis not present

## 2022-09-11 DIAGNOSIS — H543 Unqualified visual loss, both eyes: Secondary | ICD-10-CM | POA: Diagnosis not present

## 2022-09-11 DIAGNOSIS — R197 Diarrhea, unspecified: Secondary | ICD-10-CM | POA: Diagnosis not present

## 2022-09-11 DIAGNOSIS — H6692 Otitis media, unspecified, left ear: Secondary | ICD-10-CM | POA: Diagnosis not present

## 2022-09-11 LAB — COMPREHENSIVE METABOLIC PANEL
ALT: 16 U/L (ref 0–44)
AST: 16 U/L (ref 15–41)
Albumin: 3.4 g/dL — ABNORMAL LOW (ref 3.5–5.0)
Alkaline Phosphatase: 92 U/L (ref 38–126)
Anion gap: 13 (ref 5–15)
BUN: 38 mg/dL — ABNORMAL HIGH (ref 8–23)
CO2: 24 mmol/L (ref 22–32)
Calcium: 8.9 mg/dL (ref 8.9–10.3)
Chloride: 98 mmol/L (ref 98–111)
Creatinine, Ser: 1.09 mg/dL — ABNORMAL HIGH (ref 0.44–1.00)
GFR, Estimated: 53 mL/min — ABNORMAL LOW (ref >60)
Glucose, Bld: 130 mg/dL — ABNORMAL HIGH (ref 70–99)
Potassium: 3.5 mmol/L (ref 3.5–5.1)
Sodium: 135 mmol/L (ref 135–145)
Total Bilirubin: 0.5 mg/dL (ref 0.3–1.2)
Total Protein: 7.1 g/dL (ref 6.5–8.1)

## 2022-09-11 LAB — CBC WITH DIFFERENTIAL/PLATELET
Abs Immature Granulocytes: 0.15 10*3/uL — ABNORMAL HIGH (ref 0.00–0.07)
Basophils Absolute: 0.1 10*3/uL (ref 0.0–0.1)
Basophils Relative: 1 %
Eosinophils Absolute: 0.4 10*3/uL (ref 0.0–0.5)
Eosinophils Relative: 4 %
HCT: 30.6 % — ABNORMAL LOW (ref 36.0–46.0)
Hemoglobin: 9.8 g/dL — ABNORMAL LOW (ref 12.0–15.0)
Immature Granulocytes: 1 %
Lymphocytes Relative: 23 %
Lymphs Abs: 2.4 10*3/uL (ref 0.7–4.0)
MCH: 32.6 pg (ref 26.0–34.0)
MCHC: 32 g/dL (ref 30.0–36.0)
MCV: 101.7 fL — ABNORMAL HIGH (ref 80.0–100.0)
Monocytes Absolute: 0.8 10*3/uL (ref 0.1–1.0)
Monocytes Relative: 7 %
Neutro Abs: 6.9 10*3/uL (ref 1.7–7.7)
Neutrophils Relative %: 64 %
Platelets: 244 10*3/uL (ref 150–400)
RBC: 3.01 MIL/uL — ABNORMAL LOW (ref 3.87–5.11)
RDW: 15.1 % (ref 11.5–15.5)
WBC: 10.6 10*3/uL — ABNORMAL HIGH (ref 4.0–10.5)
nRBC: 0 % (ref 0.0–0.2)

## 2022-09-11 LAB — IRON AND TIBC
Iron: 52 ug/dL (ref 28–170)
Saturation Ratios: 15 % (ref 10.4–31.8)
TIBC: 341 ug/dL (ref 250–450)
UIBC: 289 ug/dL

## 2022-09-11 LAB — FERRITIN: Ferritin: 716 ng/mL — ABNORMAL HIGH (ref 11–307)

## 2022-09-12 DIAGNOSIS — E1121 Type 2 diabetes mellitus with diabetic nephropathy: Secondary | ICD-10-CM | POA: Diagnosis not present

## 2022-09-12 DIAGNOSIS — J4520 Mild intermittent asthma, uncomplicated: Secondary | ICD-10-CM | POA: Diagnosis not present

## 2022-09-12 DIAGNOSIS — J449 Chronic obstructive pulmonary disease, unspecified: Secondary | ICD-10-CM | POA: Diagnosis not present

## 2022-09-12 DIAGNOSIS — E785 Hyperlipidemia, unspecified: Secondary | ICD-10-CM | POA: Diagnosis not present

## 2022-09-15 ENCOUNTER — Other Ambulatory Visit: Payer: Self-pay

## 2022-09-15 ENCOUNTER — Inpatient Hospital Stay (HOSPITAL_BASED_OUTPATIENT_CLINIC_OR_DEPARTMENT_OTHER): Payer: Medicare Other | Admitting: Physician Assistant

## 2022-09-15 ENCOUNTER — Encounter: Payer: Self-pay | Admitting: Physician Assistant

## 2022-09-15 DIAGNOSIS — D649 Anemia, unspecified: Secondary | ICD-10-CM

## 2022-09-15 DIAGNOSIS — D631 Anemia in chronic kidney disease: Secondary | ICD-10-CM | POA: Diagnosis not present

## 2022-09-15 DIAGNOSIS — D72828 Other elevated white blood cell count: Secondary | ICD-10-CM | POA: Diagnosis not present

## 2022-09-15 DIAGNOSIS — N1831 Chronic kidney disease, stage 3a: Secondary | ICD-10-CM

## 2022-09-15 NOTE — Progress Notes (Signed)
Virtual Visit via Telephone Note [facility]  I connected with Katherine Larson  on 09/15/2022 at 2:35 PM by telephone and verified that I am speaking with the correct person using two identifiers.  Location: Patient: Home Provider: [facility]   I discussed the limitations, risks, security and privacy concerns of performing an evaluation and management service by telephone and the availability of in person appointments. I also discussed with the patient that there may be a patient responsible charge related to this service. The patient expressed understanding and agreed to proceed.  REASON FOR VISIT:  Follow-up for iron deficiency anemia and leukocytosis   CURRENT THERAPY: Intermittent IV iron infusions (Feraheme on 10/21/2021 and 10/28/2021)  INTERVAL HISTORY: Katherine Larson is contacted today for follow-up of her iron deficiency anemia/anemia of chronic disease and leukocytosis.  She was last seen by Tarri Abernethy PA-C on 05/10/2022.  At today's visit, she reports feeling somewhat poorly, as she is currently battling an ear infection and sinus infection and had suffered with GI bug a week prior.  This is led to her feeling "rundown."    She has not noticed any recent bleeding such as epistaxis, hematemesis, hematochezia, or melena.  Her fatigue is somewhat worse than usual in light of her acute illness, but otherwise has been at baseline.  She denies any headaches, pica, restless legs, palpitations, chest pain, dyspnea on exertion, lightheadedness, or syncope. She denies any B symptoms.  No new lumps or bumps.   She has 50% energy and 50% appetite. She endorses that she is maintaining a stable weight.      OBSERVATIONS/OBJECTIVE: Review of Systems  Constitutional:  Positive for malaise/fatigue. Negative for chills, diaphoresis, fever and weight loss.  HENT:  Positive for ear pain, sinus pain and sore throat.   Respiratory:  Negative for cough and  shortness of breath.   Cardiovascular:  Negative for chest pain and palpitations.  Gastrointestinal:  Negative for abdominal pain, blood in stool, melena, nausea and vomiting.  Neurological:  Negative for dizziness and headaches.     PHYSICAL EXAM (per limitations of virtual telephone visit): The patient is alert and oriented x 3, exhibiting adequate mentation, good mood, and ability to speak in full sentences and execute sound judgement.   ASSESSMENT & PLAN: 1.  Normocytic anemia  - Previously seen by Dr. 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  She was discharged from hematology clinic in 2016 as her anemia had been stable on Nu-iron supplement. - She has developed some CKD stage IIIa since January 2023 - Labs sent by PCP (09/16/2021) shows normocytic anemia with Hgb 9.6/MCV 92.2, iron saturation 11%, TIBC 373.  (Ferritin not checked.)  She had normal creatinine 0.94 when checked on 06/23/2021. - No history of blood transfusion.  Received IV Feraheme in 2015 and in August 2022. - Currently taking iron supplement Nu-iron once daily.  Also takes pantoprazole and aspirin 81 mg daily at home. - Reports episode of dark stool in the summer 2022, and followed with GI.  No current symptoms of bright red blood per rectum or melena.   - EGD (06/03/2021 by Dr. Gala Romney): Normal esophagus, stomach, duodenum - Colonoscopy (04/27/2020 by Dr. Gala Romney): Internal hemorrhoids and diverticulosis - Hemoccult stool x3 negative (December 2022) - Work-up of anemia (10/17/2021) showed Hgb 8.9/MCV 101.0.   Iron saturation 15% but elevated ferritin 329 (high suspicion that elevated ferritin was acute phase reactant, as  multiple inflammatory markers were also positive and patient has underlying RA).   Normal folate, copper, methylmalonic acid.  Elevated B12 at 2718 (likely acute phase reactant).   SPEP negative for M spike.  Immunofixation negative for monoclonal protein.  Mildly elevated  kappa light chains 41.1/normal lambda 25.6, normal ratio 1.61. BMP (02/21/2022) shows creatinine 1.21/GFR 47. - She received IV Feraheme x2 on 10/21/2021 and 10/28/2021, with subsequent improvement in her hemoglobin. - She continues to have ongoing fatigue - No bright red blood per rectum or melena  - Most recent labs (09/11/2022): Hgb 9.8/MCV 101.7, ferritin 716, iron saturation 15 % with TIBC 341.  Creatinine 1.09/GFR 53. - Differential diagnosis favors anemia of chronic disease with functional iron deficiency.  She may have some anemia related to her leflunomide, but anemia is not severe enough to warrant decreasing her leflunomide dose. - PLAN: No indication for IV iron supplementation at this time.  Hemoglobin improved after IV iron in December 2022. - Repeat labs and RTC for office visit in 4 months - Patient instructed to continue oral iron supplementation, but to take it at least 4 hours after her PPI, along with a glass of orange juice to improve absorption. - Would consider ESA if she has persistently low hemoglobin in the setting of CKD stage IIIa - If she has worsening anemia despite iron repletion, we will consider bone marrow biopsy or holding leflunomide if rheumatology is agreeable.  2.  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 Earlie Server, who thought patient to have reactive leukocytosis. - BCR/ABL by PCR testing was negative in January 2014 - JAK2 with reflex was negative - Inflammatory markers positive (10/17/2021): CRP 2.4, ESR 76, elevated B12 and ferritin - She had COVID-19 infection in November 2022. - She uses steroid inhaler (Trelegy) and intermittent Kenalog cream. - She is a current non-smoker. - She has underlying rheumatoid arthritis.  She is on leflunomide, which is managed by Dr. Gavin Pound. - She denies any B symptoms such as fever, chills, night  sweats, unintentional weight loss.  No new lumps or bumps.    - Most recent labs (09/11/2022): WBC 10.6 with normal differential - Differential diagnosis favors reactive leukocytosis in the setting of rheumatoid arthritis and chronic disease - PLAN: No further work-up or intervention at this time.  We will continue surveillance with repeat CBC in 4 months.     FOLLOW UP INSTRUCTIONS: Labs in 4 months Office visit 1 week after labs    I discussed the assessment and treatment plan with the patient. The patient was provided an opportunity to ask questions and all were answered. The patient agreed with the plan and demonstrated an understanding of the instructions.   The patient was advised to call back or seek an in-person evaluation if the symptoms worsen or if the condition fails to improve as anticipated.  I provided 22 minutes of non-face-to-face time during this encounter.   Harriett Rush, PA-C 09/15/2022 2:57 PM

## 2022-09-20 DIAGNOSIS — M1991 Primary osteoarthritis, unspecified site: Secondary | ICD-10-CM | POA: Diagnosis not present

## 2022-09-20 DIAGNOSIS — Z6824 Body mass index (BMI) 24.0-24.9, adult: Secondary | ICD-10-CM | POA: Diagnosis not present

## 2022-09-20 DIAGNOSIS — M0579 Rheumatoid arthritis with rheumatoid factor of multiple sites without organ or systems involvement: Secondary | ICD-10-CM | POA: Diagnosis not present

## 2022-09-20 DIAGNOSIS — M1009 Idiopathic gout, multiple sites: Secondary | ICD-10-CM | POA: Diagnosis not present

## 2022-09-20 DIAGNOSIS — Z79899 Other long term (current) drug therapy: Secondary | ICD-10-CM | POA: Diagnosis not present

## 2022-10-04 ENCOUNTER — Other Ambulatory Visit: Payer: Self-pay | Admitting: Family Medicine

## 2022-10-04 ENCOUNTER — Other Ambulatory Visit: Payer: Self-pay | Admitting: Physician Assistant

## 2022-10-04 ENCOUNTER — Ambulatory Visit
Admission: RE | Admit: 2022-10-04 | Discharge: 2022-10-04 | Disposition: A | Discharge status: Home / Self Care | Payer: Medicare Other | Source: Ambulatory Visit | Attending: Physician Assistant | Admitting: Physician Assistant

## 2022-10-04 DIAGNOSIS — R0609 Other forms of dyspnea: Secondary | ICD-10-CM

## 2022-10-04 DIAGNOSIS — D638 Anemia in other chronic diseases classified elsewhere: Secondary | ICD-10-CM | POA: Diagnosis not present

## 2022-10-04 DIAGNOSIS — J449 Chronic obstructive pulmonary disease, unspecified: Secondary | ICD-10-CM | POA: Diagnosis not present

## 2022-10-04 DIAGNOSIS — R7989 Other specified abnormal findings of blood chemistry: Secondary | ICD-10-CM

## 2022-10-04 DIAGNOSIS — I5022 Chronic systolic (congestive) heart failure: Secondary | ICD-10-CM | POA: Diagnosis not present

## 2022-10-04 DIAGNOSIS — R0602 Shortness of breath: Secondary | ICD-10-CM | POA: Diagnosis not present

## 2022-10-05 ENCOUNTER — Ambulatory Visit
Admission: RE | Admit: 2022-10-05 | Discharge: 2022-10-05 | Disposition: A | Discharge status: Home / Self Care | Payer: Medicare Other | Source: Ambulatory Visit | Attending: Family Medicine | Admitting: Family Medicine

## 2022-10-05 DIAGNOSIS — R0602 Shortness of breath: Secondary | ICD-10-CM | POA: Diagnosis not present

## 2022-10-05 DIAGNOSIS — R079 Chest pain, unspecified: Secondary | ICD-10-CM | POA: Diagnosis not present

## 2022-10-05 DIAGNOSIS — R918 Other nonspecific abnormal finding of lung field: Secondary | ICD-10-CM | POA: Diagnosis not present

## 2022-10-05 DIAGNOSIS — Z950 Presence of cardiac pacemaker: Secondary | ICD-10-CM | POA: Diagnosis not present

## 2022-10-05 DIAGNOSIS — R7989 Other specified abnormal findings of blood chemistry: Secondary | ICD-10-CM

## 2022-10-05 DIAGNOSIS — Z9689 Presence of other specified functional implants: Secondary | ICD-10-CM | POA: Diagnosis not present

## 2022-10-05 DIAGNOSIS — M961 Postlaminectomy syndrome, not elsewhere classified: Secondary | ICD-10-CM | POA: Diagnosis not present

## 2022-10-05 DIAGNOSIS — M47812 Spondylosis without myelopathy or radiculopathy, cervical region: Secondary | ICD-10-CM | POA: Diagnosis not present

## 2022-10-05 MED ORDER — IOPAMIDOL (ISOVUE-370) INJECTION 76%
75.0000 mL | Freq: Once | INTRAVENOUS | Status: AC | PRN
  Administered 2022-10-05: 75 mL via INTRAVENOUS

## 2022-10-11 ENCOUNTER — Encounter: Payer: Self-pay | Admitting: Gastroenterology

## 2022-10-11 ENCOUNTER — Ambulatory Visit: Payer: Medicare Other | Admitting: Gastroenterology

## 2022-10-11 VITALS — BP 102/62 | HR 92 | Temp 98.5°F | Ht 62.0 in | Wt 137.8 lb

## 2022-10-11 DIAGNOSIS — R1013 Epigastric pain: Secondary | ICD-10-CM | POA: Diagnosis not present

## 2022-10-11 DIAGNOSIS — G8929 Other chronic pain: Secondary | ICD-10-CM | POA: Diagnosis not present

## 2022-10-11 DIAGNOSIS — K5900 Constipation, unspecified: Secondary | ICD-10-CM | POA: Diagnosis not present

## 2022-10-11 DIAGNOSIS — K219 Gastro-esophageal reflux disease without esophagitis: Secondary | ICD-10-CM | POA: Diagnosis not present

## 2022-10-11 NOTE — Progress Notes (Signed)
GI Office Note    Referring Provider: Harlan Stains, MD Primary Care Physician:  Harlan Stains, MD  Primary Gastroenterologist: Garfield Cornea, MD   Chief Complaint   Chief Complaint  Patient presents with   Follow-up    Doing well, no current GI issues.     History of Present Illness   Katherine Larson is a 76 y.o. female presenting today for follow-up.  She had video visit back in September.  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  At last visit she was complaining of nausea of 1 month duration.  Also with poorly controlled reflux both daytime and at nighttime.  Since her last visit she completed gastric emptying study which was mildly delayed particularly at the 2 and 3-hour mark. She has been on Prevacid 30 mg twice daily. She continues to take stool softeners three at night. BM about every day. Heartburn doing better. Nausea doing better. No melena. Occasional brbpr with straining. She says sometimes when sitting, she feels a knot just below her breastbone and it hurts when she presses on it. Worse with bloating. Currently on antibiotics for lung infection. No weight loss.   Colonoscopy June 2021: - Non-bleeding internal hemorrhoids. - Diverticulosis in the sigmoid colon and in the descending colon. Status post segmental biopsy. - The examination was otherwise normal on direct and retroflexion views. Normalappearing terminal ileum. -Biopsy showed chronic lymphocytic colitis.  EGD 06/03/21 with  normal esophagus, normal stomach, normal duodenum.  Abnormal hypopharyngeal mucosa bilaterally just distal to the base of the tongue (right side greater than left), overlying mucosa appeared irregular and somewhat verrucous in appearance.  Recommended ENT evaluation.   Evaluated by ENT who stated patient had no mucosal lesions in the hypopharynx or larynx on exam. 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 Recommend consideration for Botox injections of the cricopharyngeus. Last resort option would be surgical resection of the cricopharyngeal muscle.    She saw Dr. Joya Gaskins with ENT/Head and Neck Surgery on 09/07/21. She had FEES the same day just prior to this visit. She was found to have slight-mild pharyngeal dysphagia, likely impacted by generalized deconditioning and age-related changes to the swallowing mechanism. Recommended regular solids/thin liquids, with adherence to aspiration precautions and swallowing strategies including alternating liquids and solids and performing intermittent post-swallow throat clearing with liquids. Overall, Dr. Joya Gaskins felt she would do relatively well with conservative treatment. Recommended formal esophagram and an evaluation in the voice lab and return for follow-up on the same day, but this was not completed.    Medications   Current Outpatient Medications  Medication Sig Dispense Refill   albuterol (PROVENTIL HFA;VENTOLIN HFA) 108 (90 Base) MCG/ACT inhaler Take 2 puffs 3 times a day and every 4 hours as needed.     allopurinol (ZYLOPRIM) 300 MG tablet Take 300 mg by mouth daily.     aspirin EC 81 MG EC tablet Take 1 tablet (81 mg total) by mouth daily.     atorvastatin (LIPITOR) 80 MG tablet Take 80 mg by mouth daily.     calcium carbonate (TUMS - DOSED IN MG ELEMENTAL CALCIUM) 500 MG chewable tablet Chew 1 tablet by mouth as needed for indigestion or heartburn.     carvedilol (COREG) 6.25 MG tablet Take  1.5 tablets (9.375 mg total) by mouth 2 (two) times daily. 180 tablet 3   cefUROXime (CEFTIN) 500 MG tablet Take 500 mg by mouth 2 (two) times daily.     cholecalciferol (VITAMIN D3) 25 MCG (1000 UT) tablet Take 1,000 Units by mouth daily.      Cyanocobalamin 2500 MCG SUBL 1 tablet Sublingual Once a day     diclofenac Sodium (VOLTAREN) 1 % GEL Apply topically.     doxycycline (VIBRA-TABS) 100 MG tablet Take 100 mg by mouth 2 (two) times daily.     etanercept (ENBREL SURECLICK) 50 MG/ML injection INJECT 50 MG Subcutaneous ONCE WEEKLY for 30 days     FARXIGA 10 MG TABS tablet TAKE 1 TABLET(10 MG) BY MOUTH DAILY BEFORE BREAKFAST 30 tablet 11   ferrous sulfate 325 (65 FE) MG tablet Take 325 mg by mouth daily with breakfast. Every other day     fexofenadine (ALLEGRA) 180 MG tablet Take 180 mg by mouth daily.     fluticasone (FLONASE) 50 MCG/ACT nasal spray Place 2 sprays into both nostrils daily.     furosemide (LASIX) 40 MG tablet 40 mg daily 45 tablet 3   gabapentin (NEURONTIN) 400 MG capsule TAKE 1 CAPSULE(400 MG) BY MOUTH THREE TIMES DAILY 90 capsule 5   HYDROcodone-acetaminophen (NORCO) 10-325 MG tablet Take 1 tablet by mouth every 6 (six) hours as needed for moderate pain.     icosapent Ethyl (VASCEPA) 1 g capsule Take 2 capsules (2 g total) by mouth 2 (two) times daily. 120 capsule 10   Lancets (ONETOUCH DELICA PLUS [id number]) MISC      lansoprazole (PREVACID) 30 MG capsule Take 30 mg by mouth 2 (two) times daily before a meal.     leflunomide (ARAVA) 20 MG tablet Take 20 mg by mouth daily.     losartan (COZAAR) 100 MG tablet Take 100 mg by mouth daily.     MAGNESIUM-OXIDE 400 (240 Mg) MG tablet TAKE 1 TABLET(400 MG) BY MOUTH DAILY 30 tablet 2   meclizine (ANTIVERT) 12.5 MG tablet Take 12.5 mg by mouth 3 (three) times daily as needed for dizziness.     montelukast (SINGULAIR) 10 MG tablet Take 10 mg by mouth at bedtime.     Multiple Vitamins-Minerals (OCUVITE ADULT 50+ PO) Take 1 tablet by mouth daily.     nitroGLYCERIN (NITROSTAT) 0.4 MG SL tablet DISSOLVE 1 TABLET UNDER THE  TONGUE EVERY 5 MINUTES AS NEEDED FOR CHEST PAIN. MAX OF 3 TABLETS IN 15 MINUTES. CALL 911 IF PAIN  PERSISTS. 75 tablet 4   ONETOUCH ULTRA test strip       spironolactone (ALDACTONE) 25 MG tablet Take 0.5 tablets (12.5 mg total) by mouth daily. 90 tablet 3   TRELEGY ELLIPTA 200-62.5-25 MCG/ACT AEPB Inhale 1 puff into the lungs daily.     No current facility-administered medications for this visit.    Allergies   Allergies as of 10/11/2022 - Review Complete 10/11/2022  Allergen Reaction Noted   Biaxin [clarithromycin] Rash and Other (See Comments) 02/02/2010   Penicillins Rash and Other (See Comments) 02/02/2010   Hydroxychloroquine Other (See Comments) 10/11/2022   Sulfasalazine Other (See Comments) 10/11/2022   Losartan potassium  10/09/2021     Review of Systems   General: Negative for anorexia, weight loss, fever, chills, fatigue, weakness. ENT: Negative for hoarseness, difficulty swallowing , nasal congestion. CV: Negative for chest pain, angina, palpitations, dyspnea on exertion, peripheral edema.  Respiratory: Negative for dyspnea at rest, dyspnea on exertion, cough,  sputum, wheezing.  GI: See history of present illness. GU:  Negative for dysuria, hematuria, urinary incontinence, urinary frequency, nocturnal urination.  Endo: Negative for unusual weight change.     Physical Exam   BP 102/62 (BP Location: Right Arm, Patient Position: Sitting, Cuff Size: Normal)   Pulse 92   Temp 98.5 F (36.9 C) (Oral)   Ht '5\' 2"'$  (1.575 m)   Wt 137 lb 12.8 oz (62.5 kg)   SpO2 96%   BMI 25.20 kg/m    General: Well-nourished, well-developed in no acute distress.  Eyes: No icterus. Mouth: Oropharyngeal mucosa moist and pink , no lesions erythema or exudate. Lungs: Clear to auscultation bilaterally.  Heart: Regular rate and rhythm, no murmurs rubs or gallops.  Abdomen: Bowel sounds are normal,  nondistended, no hepatosplenomegaly or masses,  no abdominal bruits or hernia , no rebound or guarding. Mild tenderness noted in epigastric region over incision/scar. No masses, hernia or significant rectus diastasis.  Rectal: not performed   Extremities: No lower extremity edema. No clubbing or deformities. Neuro: Alert and oriented x 4   Skin: Warm and dry, no jaundice.   Psych: Alert and cooperative, normal mood and affect.  Labs   Lab Results  Component Value Date   CREATININE 1.09 (H) 09/11/2022   BUN 38 (H) 09/11/2022   NA 135 09/11/2022   K 3.5 09/11/2022   CL 98 09/11/2022   CO2 24 09/11/2022   Lab Results  Component Value Date   ALT 16 09/11/2022   AST 16 09/11/2022   ALKPHOS 92 09/11/2022   BILITOT 0.5 09/11/2022   Lab Results  Component Value Date   WBC 10.6 (H) 09/11/2022   HGB 9.8 (L) 09/11/2022   HCT 30.6 (L) 09/11/2022   MCV 101.7 (H) 09/11/2022   PLT 244 09/11/2022   Lab Results  Component Value Date   IRON 52 09/11/2022   TIBC 341 09/11/2022   FERRITIN 716 (H) 09/11/2022   Lab Results  Component Value Date   VITAMINB12 2,718 (H) 10/17/2021   Lab Results  Component Value Date   FOLATE 7.3 10/17/2021    Imaging Studies   DG Chest 2 View  Result Date: 10/06/2022 CLINICAL DATA:  Shortness of breath with exertion. EXAM: CHEST - 2 VIEW COMPARISON:  07/21/2022 and CT chest 03/20/2022. FINDINGS: Trachea is midline. Heart is enlarged, stable. Thoracic aorta is calcified. ICD lead tip terminates in the right ventricle. Scattered bilateral scarring. No airspace consolidation or pleural fluid. Spinal stimulator wires are in place. IMPRESSION: 1. No acute findings. 2. Scattered bilateral scarring. If there is concern for interstitial lung disease, consider high-resolution chest CT without contrast. Electronically Signed   By: Lorin Picket M.D.   On: 10/06/2022 08:51   CT Angio Chest Pulmonary Embolism (PE) W or WO Contrast  Result Date: 10/05/2022 CLINICAL DATA:  Elevated D-dimer, short of breath. Chest pain for 2 months EXAM: CT ANGIOGRAPHY CHEST WITH CONTRAST TECHNIQUE: Multidetector CT imaging of the chest was performed using the standard protocol during bolus administration of  intravenous contrast. Multiplanar CT image reconstructions and MIPs were obtained to evaluate the vascular anatomy. RADIATION DOSE REDUCTION: This exam was performed according to the departmental dose-optimization program which includes automated exposure control, adjustment of the mA and/or kV according to patient size and/or use of iterative reconstruction technique. CONTRAST:  47m ISOVUE-370 IOPAMIDOL (ISOVUE-370) INJECTION 76% COMPARISON:  CT chest 03/22/2022 FINDINGS: Cardiovascular: No filling defects within the pulmonary arteries to suggest acute pulmonary embolism. Post CABG  anatomy. LEFT-sided pacer Mediastinum/Nodes: No axillary or supraclavicular adenopathy. No mediastinal or hilar adenopathy. No pericardial fluid. Esophagus normal. Lungs/Pleura: Irregular nodule in RIGHT upper lobe measures 4 mm (image 66/8). Nodule not present on most recent comparison CT (03/20/2022). There is peribronchial nodularity and peribronchial interstitial thickening in the RIGHT middle lobe in a segmental pattern (image 55 through 66 of series 8). Upper Abdomen: Limited view of the liver, kidneys, pancreas are unremarkable. Normal adrenal glands. Musculoskeletal: Spinal cord stimulation device in the central canal of the midthoracic spine. No aggressive osseous lesion. Midline sternotomy Review of the MIP images confirms the above findings. IMPRESSION: 1. No evidence acute pulmonary embolism. 2. New irregular nodule in the RIGHT upper lobe and new peribronchial nodularity in the lingula. Findings are most suggestive of pulmonary infection. Recommend follow-up CT in 3 months to demonstrate resolution (particularly of the new RIGHT upper lobe pulmonary nodule). Electronically Signed   By: Suzy Bouchard M.D.   On: 10/05/2022 13:05    Assessment   Constipation: patient pleased with management with stool softeners.   GERD/nausea: doing better on lansoprazole '30mg'$  BID before meals.   Weight loss: previous weight loss  noted from 02/2021 to 01/2022 that was unintentional. Weight has been stable for over the past one year.   Anemia: Continue to follow with hematology  Abdominal pain: vague upper abdominal complaints.  When sitting feels like her upper abdomen is hard and hurts when she presses on it.  Exam unremarkable today.  She will continue to follow for now, with progressive or persistent symptoms, she will let us know and at that point would consider CT scan.   PLAN   Continue lansoprazole 30 mg twice daily before meals. Monitor for more frequent blood per rectum.  She will let us know if she develops recurrent symptoms. Vague upper abdominal complaints, she will monitor for now and if symptoms become more frequent or persistent she will let us know and at that time would consider CT scan of the abdomen and pelvis. Return to the office in 4 months or sooner if needed.   Laureen Ochs. Bobby Rumpf, [place], [facility]

## 2022-10-11 NOTE — Patient Instructions (Signed)
Continue lansoprazole '30mg'$  twice daily before breakfast and evening meal.  Monitor for recurrent blood in your stool. If you continue to see blood in the stool please let us know.  Monitor your upper abdominal pain. If you notice more frequent or persistent symptoms, please let us know. Would consider updating your CT scan of abdomen if persistent symptoms.  Return to the office in four months for follow up.

## 2022-10-12 ENCOUNTER — Other Ambulatory Visit (HOSPITAL_COMMUNITY): Payer: Self-pay | Admitting: Cardiology

## 2022-10-17 ENCOUNTER — Inpatient Hospital Stay: Admission: RE | Admit: 2022-10-17 | Payer: Medicare Other | Source: Ambulatory Visit

## 2022-10-23 DIAGNOSIS — M71342 Other bursal cyst, left hand: Secondary | ICD-10-CM | POA: Diagnosis not present

## 2022-10-23 DIAGNOSIS — L570 Actinic keratosis: Secondary | ICD-10-CM | POA: Diagnosis not present

## 2022-10-23 DIAGNOSIS — L0109 Other impetigo: Secondary | ICD-10-CM | POA: Diagnosis not present

## 2022-10-23 DIAGNOSIS — Z85828 Personal history of other malignant neoplasm of skin: Secondary | ICD-10-CM | POA: Diagnosis not present

## 2022-10-23 DIAGNOSIS — L821 Other seborrheic keratosis: Secondary | ICD-10-CM | POA: Diagnosis not present

## 2022-10-23 DIAGNOSIS — D225 Melanocytic nevi of trunk: Secondary | ICD-10-CM | POA: Diagnosis not present

## 2022-10-23 DIAGNOSIS — D692 Other nonthrombocytopenic purpura: Secondary | ICD-10-CM | POA: Diagnosis not present

## 2022-10-23 DIAGNOSIS — D1801 Hemangioma of skin and subcutaneous tissue: Secondary | ICD-10-CM | POA: Diagnosis not present

## 2022-10-24 DIAGNOSIS — I129 Hypertensive chronic kidney disease with stage 1 through stage 4 chronic kidney disease, or unspecified chronic kidney disease: Secondary | ICD-10-CM | POA: Diagnosis not present

## 2022-10-24 DIAGNOSIS — Z23 Encounter for immunization: Secondary | ICD-10-CM | POA: Diagnosis not present

## 2022-10-24 DIAGNOSIS — I2510 Atherosclerotic heart disease of native coronary artery without angina pectoris: Secondary | ICD-10-CM | POA: Diagnosis not present

## 2022-10-24 DIAGNOSIS — I5022 Chronic systolic (congestive) heart failure: Secondary | ICD-10-CM | POA: Diagnosis not present

## 2022-10-24 DIAGNOSIS — E1121 Type 2 diabetes mellitus with diabetic nephropathy: Secondary | ICD-10-CM | POA: Diagnosis not present

## 2022-10-24 DIAGNOSIS — I700 Atherosclerosis of aorta: Secondary | ICD-10-CM | POA: Diagnosis not present

## 2022-10-24 DIAGNOSIS — E785 Hyperlipidemia, unspecified: Secondary | ICD-10-CM | POA: Diagnosis not present

## 2022-10-24 DIAGNOSIS — D509 Iron deficiency anemia, unspecified: Secondary | ICD-10-CM | POA: Diagnosis not present

## 2022-10-24 DIAGNOSIS — J449 Chronic obstructive pulmonary disease, unspecified: Secondary | ICD-10-CM | POA: Diagnosis not present

## 2022-10-24 DIAGNOSIS — R0609 Other forms of dyspnea: Secondary | ICD-10-CM | POA: Diagnosis not present

## 2022-10-24 DIAGNOSIS — N183 Chronic kidney disease, stage 3 unspecified: Secondary | ICD-10-CM | POA: Diagnosis not present

## 2022-10-30 ENCOUNTER — Other Ambulatory Visit (HOSPITAL_COMMUNITY): Payer: Self-pay | Admitting: Pharmacist

## 2022-10-30 MED ORDER — SPIRONOLACTONE 25 MG PO TABS: 12.5000 mg | ORAL_TABLET | Freq: Every day | ORAL | 3 refills | Status: DC

## 2022-11-03 ENCOUNTER — Ambulatory Visit (INDEPENDENT_AMBULATORY_CARE_PROVIDER_SITE_OTHER): Payer: Medicare Other

## 2022-11-03 DIAGNOSIS — I255 Ischemic cardiomyopathy: Secondary | ICD-10-CM | POA: Diagnosis not present

## 2022-11-03 LAB — CUP PACEART REMOTE DEVICE CHECK
Battery Remaining Longevity: 102 mo
Battery Remaining Percentage: 83 %
Battery Voltage: 3.01 V
Brady Statistic RV Percent Paced: 1 %
Date Time Interrogation Session: 20231215010012
HighPow Impedance: 48 Ohm
Implantable Lead Connection Status: 753985
Implantable Lead Implant Date: 20220318
Implantable Lead Location: 753860
Implantable Pulse Generator Implant Date: 20220318
Lead Channel Impedance Value: 390 Ohm
Lead Channel Pacing Threshold Amplitude: 0.75 V
Lead Channel Pacing Threshold Pulse Width: 0.5 ms
Lead Channel Sensing Intrinsic Amplitude: 12 mV
Lead Channel Setting Pacing Amplitude: 2.5 V
Lead Channel Setting Pacing Pulse Width: 0.5 ms
Lead Channel Setting Sensing Sensitivity: 0.5 mV
Pulse Gen Serial Number: 810021948
Zone Setting Status: 755011

## 2022-11-08 ENCOUNTER — Other Ambulatory Visit: Payer: Self-pay | Admitting: Family Medicine

## 2022-11-08 DIAGNOSIS — R197 Diarrhea, unspecified: Secondary | ICD-10-CM | POA: Diagnosis not present

## 2022-11-08 DIAGNOSIS — R1900 Intra-abdominal and pelvic swelling, mass and lump, unspecified site: Secondary | ICD-10-CM

## 2022-11-16 ENCOUNTER — Other Ambulatory Visit (HOSPITAL_COMMUNITY): Payer: Self-pay | Admitting: Cardiology

## 2022-11-22 ENCOUNTER — Telehealth: Payer: Self-pay

## 2022-11-22 DIAGNOSIS — A09 Infectious gastroenteritis and colitis, unspecified: Secondary | ICD-10-CM

## 2022-11-22 NOTE — Telephone Encounter (Signed)
Katherine Larson from [facility] called stating that the pt has been having diarrhea for the last three weeks that is not resolving. Pt started having diarrhea after being put on antibiotics for a uti and imodium is not controlling it. Pt has an appt towards the end of the month. Pt also called stating that everything she eats and drinks goes through her. Katherine Larson has an urgent slot open tomorrow at 4pm. Please advise.

## 2022-11-22 NOTE — Telephone Encounter (Signed)
She has a history of lymphocytic colitis but given recent antibiotics, we need to check for cdiff.  I will place orders for [facility].   Let's arrange for ov first available nonurgent ov in the next week. If none available, please use my urgent on Tuesday.

## 2022-11-22 NOTE — Addendum Note (Signed)
Addended by: Mahala Menghini on: 11/22/2022 04:59 PM   Modules accepted: Orders

## 2022-11-23 NOTE — Telephone Encounter (Signed)
Appt was changed to next Tuesday. Pt was made aware.

## 2022-11-23 NOTE — Progress Notes (Signed)
Remote ICD transmission.   

## 2022-11-24 DIAGNOSIS — A09 Infectious gastroenteritis and colitis, unspecified: Secondary | ICD-10-CM | POA: Diagnosis not present

## 2022-11-25 ENCOUNTER — Other Ambulatory Visit (HOSPITAL_COMMUNITY): Payer: Self-pay | Admitting: Cardiology

## 2022-11-28 ENCOUNTER — Other Ambulatory Visit (HOSPITAL_COMMUNITY)
Admission: RE | Admit: 2022-11-28 | Discharge: 2022-11-28 | Disposition: A | Discharge status: Home / Self Care | Payer: Medicare Other | Source: Ambulatory Visit | Attending: Gastroenterology | Admitting: Gastroenterology

## 2022-11-28 ENCOUNTER — Ambulatory Visit: Payer: Medicare Other | Admitting: Gastroenterology

## 2022-11-28 ENCOUNTER — Encounter: Payer: Self-pay | Admitting: Gastroenterology

## 2022-11-28 VITALS — BP 97/53 | HR 83 | Temp 97.9°F | Ht 62.5 in | Wt 129.8 lb

## 2022-11-28 DIAGNOSIS — K921 Melena: Secondary | ICD-10-CM

## 2022-11-28 DIAGNOSIS — R1010 Upper abdominal pain, unspecified: Secondary | ICD-10-CM | POA: Diagnosis not present

## 2022-11-28 DIAGNOSIS — K625 Hemorrhage of anus and rectum: Secondary | ICD-10-CM | POA: Diagnosis not present

## 2022-11-28 DIAGNOSIS — R197 Diarrhea, unspecified: Secondary | ICD-10-CM | POA: Diagnosis not present

## 2022-11-28 LAB — GI PROFILE, STOOL, PCR

## 2022-11-28 NOTE — Patient Instructions (Signed)
Please go to [facility] lab today for bloodwork. We will be in touch with results and further recommendations.

## 2022-11-28 NOTE — Progress Notes (Signed)
GI Office Note    Referring Provider: Harlan Stains, MD Primary Care Physician:  Harlan Stains, MD  Primary Gastroenterologist: Garfield Cornea, MD   Chief Complaint   Chief Complaint  Patient presents with   Follow-up    States that her bowels for about two weeks was straight water. States that it is somewhat starting to get better.     History of Present Illness   Katherine Larson is a 77 y.o. 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 We received call from [facility] 11/22/22 stating patient had diarrhea ongoing for 3 weeks. Recently on antibiotics for UTI and imodium not controlling symptoms.   We ordered GI profile, Cdiff toxin assay and made urgent ov.  Stools studies returned negative.   Patient states her diarrhea started shortly after her last ov here in 09/2022. She took ceftin in 08/2022. She states she was given another antibiotic 12/28, Bactrim, she thought was for her diarrhea.   She states at first her stools were black, then she noted brbpr. Last episode was two weeks ago. She took two bottles of imodium without any improvement in her diarrhea. Also tried Pepto, states her black stools were before Pepto. Her weight is down 8 pounds since 09/2022. She is drinking plenty of fluids and pedialyte. Not eating a lot of solid foods. Complains of upper abdominal pain which started with the diarrhea. She has diarrhea every time she eats. Chronically has some dizziness, has been feeling more weak than usual for her.      Colonoscopy June 2021: - Non-bleeding internal hemorrhoids. - Diverticulosis in the sigmoid colon and in the descending colon. Status post segmental biopsy. - The  examination was otherwise normal on direct and retroflexion views. Normalappearing terminal ileum. -Biopsy showed chronic lymphocytic colitis  EGD 05/2021: normal esophagus, small hiatal hernia. Abnormal hypopharyngeal area, verrucous in appearance, followed with ENT.   Medications   Current Outpatient Medications  Medication Sig Dispense Refill   albuterol (PROVENTIL HFA;VENTOLIN HFA) 108 (90 Base) MCG/ACT inhaler Take 2 puffs 3 times a day and every 4 hours as needed.     allopurinol (ZYLOPRIM) 300 MG tablet Take 300 mg by mouth daily.     aspirin EC 81 MG EC tablet Take 1 tablet (81 mg total) by mouth daily.     atorvastatin (LIPITOR) 80 MG tablet Take 80 mg by mouth daily.     calcium carbonate (TUMS - DOSED IN MG ELEMENTAL CALCIUM) 500 MG chewable tablet Chew 1 tablet by mouth as needed for indigestion or heartburn.     carvedilol (COREG) 6.25 MG tablet TAKE 1 AND 1/2 TABLETS(9.375 MG) BY MOUTH TWICE DAILY 180 tablet 3   cholecalciferol (VITAMIN D3) 25 MCG (1000 UT) tablet Take 1,000 Units by mouth daily.     Cyanocobalamin 2500 MCG SUBL 1 tablet Sublingual Once a day     dexlansoprazole (DEXILANT) 60 MG capsule      diclofenac Sodium (VOLTAREN) 1 % GEL Apply topically.     etanercept (ENBREL SURECLICK) 50 MG/ML injection INJECT 50 MG Subcutaneous ONCE WEEKLY for 30 days     FARXIGA 10 MG TABS tablet TAKE 1 TABLET(10 MG) BY MOUTH DAILY BEFORE BREAKFAST 30 tablet 11   ferrous sulfate 325 (  65 FE) MG tablet Take 325 mg by mouth daily with breakfast. Every other day     fexofenadine (ALLEGRA) 180 MG tablet Take 180 mg by mouth daily.     fluticasone (FLONASE) 50 MCG/ACT nasal spray Place 2 sprays into both nostrils daily.     furosemide (LASIX) 40 MG tablet 40 mg daily 45 tablet 3   gabapentin (NEURONTIN) 400 MG capsule TAKE 1 CAPSULE(400 MG) BY MOUTH THREE TIMES DAILY 90 capsule 5   HYDROcodone-acetaminophen (NORCO) 10-325 MG tablet Take 1 tablet by mouth every 6 (six) hours as needed  for moderate pain.     icosapent Ethyl (VASCEPA) 1 g capsule Take 2 capsules (2 g total) by mouth 2 (two) times daily. 120 capsule 10   Lancets (ONETOUCH DELICA PLUS [id number]) MISC      leflunomide (ARAVA) 20 MG tablet Take 20 mg by mouth daily.     losartan (COZAAR) 100 MG tablet Take 100 mg by mouth daily.     MAGNESIUM-OXIDE 400 (240 Mg) MG tablet TAKE 1 TABLET(400 MG) BY MOUTH DAILY 30 tablet 2   meclizine (ANTIVERT) 12.5 MG tablet Take 12.5 mg by mouth 3 (three) times daily as needed for dizziness.     montelukast (SINGULAIR) 10 MG tablet Take 10 mg by mouth at bedtime.     Multiple Vitamins-Minerals (OCUVITE ADULT 50+ PO) Take 1 tablet by mouth daily.     nitroGLYCERIN (NITROSTAT) 0.4 MG SL tablet DISSOLVE 1 TABLET UNDER THE  TONGUE EVERY 5 MINUTES AS NEEDED FOR CHEST PAIN. MAX OF 3 TABLETS IN 15 MINUTES. CALL 911 IF PAIN  PERSISTS. 75 tablet 4   ONETOUCH ULTRA test strip      spironolactone (ALDACTONE) 25 MG tablet Take 0.5 tablets (12.5 mg total) by mouth daily. 45 tablet 3   TRELEGY ELLIPTA 200-62.5-25 MCG/ACT AEPB Inhale 1 puff into the lungs daily.     No current facility-administered medications for this visit.    Allergies   Allergies as of 11/28/2022 - Review Complete 11/28/2022  Allergen Reaction Noted   Biaxin [clarithromycin] Rash and Other (See Comments) 02/02/2010   Penicillins Rash and Other (See Comments) 02/02/2010   Hydroxychloroquine Other (See Comments) 10/11/2022   Sulfasalazine Other (See Comments) 10/11/2022   Losartan potassium  10/09/2021      Review of Systems   General: Negative for anorexia,  fever, chills, fatigue, +weakness. See hpi ENT: Negative for hoarseness, difficulty swallowing , nasal congestion. CV: Negative for chest pain, angina, palpitations, dyspnea on exertion, peripheral edema.  Respiratory: Negative for dyspnea at rest, dyspnea on exertion, cough, sputum, wheezing.  GI: See history of present illness. GU:  Negative for dysuria,  hematuria, urinary incontinence, urinary frequency, nocturnal urination.  Endo: Negative for unusual weight change.     Physical Exam   BP (!) 97/53 (BP Location: Right Arm, Patient Position: Sitting, Cuff Size: Normal)   Pulse 83   Temp 97.9 F (36.6 C) (Oral)   Ht 5' 2.5" (1.588 m)   Wt 129 lb 12.8 oz (58.9 kg)   SpO2 94%   BMI 23.36 kg/m    General: Well-nourished, well-developed in no acute distress. Pale. Eyes: No icterus. Mouth: Oropharyngeal mucosa moist and pink   Lungs: Clear to auscultation bilaterally.  Heart: Regular rate and rhythm, no murmurs rubs or gallops.  Abdomen: Bowel sounds are normal, nontender, nondistended, no hepatosplenomegaly or masses,  no abdominal bruits or hernia, no rebound or guarding. Point of concern for palpable hardness per patient, noted over  pubic bone, did not appreciate any herniation lying or standing.  Rectal: not performed Extremities: No lower extremity edema. No clubbing or deformities. Neuro: Alert and oriented x 4   Skin: Warm and dry, no jaundice.   Psych: Alert and cooperative, normal mood and affect.  Labs   Lab Results  Component Value Date   CREATININE 1.09 (H) 09/11/2022   BUN 38 (H) 09/11/2022   NA 135 09/11/2022   K 3.5 09/11/2022   CL 98 09/11/2022   CO2 24 09/11/2022   Lab Results  Component Value Date   ALT 16 09/11/2022   AST 16 09/11/2022   ALKPHOS 92 09/11/2022   BILITOT 0.5 09/11/2022   Lab Results  Component Value Date   WBC 10.6 (H) 09/11/2022   HGB 9.8 (L) 09/11/2022   HCT 30.6 (L) 09/11/2022   MCV 101.7 (H) 09/11/2022   PLT 244 09/11/2022   Lab Results  Component Value Date   IRON 52 09/11/2022   TIBC 341 09/11/2022   FERRITIN 716 (H) 09/11/2022   Lab Results  Component Value Date   VITAMINB12 2,718 (H) 10/17/2021   Lab Results  Component Value Date   FOLATE 7.3 10/17/2021    Imaging Studies   CUP PACEART REMOTE DEVICE CHECK  Result Date: 11/03/2022 Scheduled remote reviewed.  Normal device function.  Corvue below threshold x 13 days Next remote 91 days. LA   Assessment   Diarrhea: symptoms present since 09/2022. Reports at first stools were black and then noted brbpr. Last episode of overt bleeding about two weeks ago. Symptoms associated with upper abdominal pain. Given recent antibiotic use, stool studies done to rule out Cdiff. Her gi profile was also negative. She has history of lymphocytic colitis and may have relapse but this would not explain black or bloody stools. Recent GI bleeding unclear etiology and may require endoscopic evaluation.     PLAN   Go to [facility] for CBC. Further recommendations to follow.  Consider course of entocort for lymphocytic colitis pending labs.    Laureen Ochs. Bobby Rumpf, [place], [facility]

## 2022-11-29 DIAGNOSIS — R197 Diarrhea, unspecified: Secondary | ICD-10-CM | POA: Diagnosis not present

## 2022-11-29 DIAGNOSIS — K625 Hemorrhage of anus and rectum: Secondary | ICD-10-CM | POA: Diagnosis not present

## 2022-11-29 DIAGNOSIS — K921 Melena: Secondary | ICD-10-CM | POA: Diagnosis not present

## 2022-11-29 DIAGNOSIS — R1010 Upper abdominal pain, unspecified: Secondary | ICD-10-CM | POA: Diagnosis not present

## 2022-11-30 LAB — COMPREHENSIVE METABOLIC PANEL
ALT: 20 IU/L (ref 0–32)
AST: 20 IU/L (ref 0–40)
Albumin/Globulin Ratio: 1.4 (ref 1.2–2.2)
Albumin: 3.9 g/dL (ref 3.8–4.8)
Alkaline Phosphatase: 113 IU/L (ref 44–121)
BUN/Creatinine Ratio: 20 (ref 12–28)
BUN: 16 mg/dL (ref 8–27)
Bilirubin Total: 0.3 mg/dL (ref 0.0–1.2)
CO2: 24 mmol/L (ref 20–29)
Calcium: 9.1 mg/dL (ref 8.7–10.3)
Chloride: 102 mmol/L (ref 96–106)
Creatinine, Ser: 0.81 mg/dL (ref 0.57–1.00)
Globulin, Total: 2.7 g/dL (ref 1.5–4.5)
Glucose: 93 mg/dL (ref 70–99)
Potassium: 4.4 mmol/L (ref 3.5–5.2)
Sodium: 140 mmol/L (ref 134–144)
Total Protein: 6.6 g/dL (ref 6.0–8.5)
eGFR: 76 mL/min/{1.73_m2} (ref >59)

## 2022-11-30 LAB — CBC WITH DIFFERENTIAL/PLATELET
Basophils Absolute: 0 10*3/uL (ref 0.0–0.2)
Basos: 1 %
EOS (ABSOLUTE): 0.3 10*3/uL (ref 0.0–0.4)
Eos: 4 %
Hematocrit: 33.2 % — ABNORMAL LOW (ref 34.0–46.6)
Hemoglobin: 10.8 g/dL — ABNORMAL LOW (ref 11.1–15.9)
Immature Grans (Abs): 0 10*3/uL (ref 0.0–0.1)
Immature Granulocytes: 0 %
Lymphocytes Absolute: 2.6 10*3/uL (ref 0.7–3.1)
Lymphs: 33 %
MCH: 31.6 pg (ref 26.6–33.0)
MCHC: 32.5 g/dL (ref 31.5–35.7)
MCV: 97 fL (ref 79–97)
Monocytes Absolute: 0.7 10*3/uL (ref 0.1–0.9)
Monocytes: 9 %
Neutrophils Absolute: 4.2 10*3/uL (ref 1.4–7.0)
Neutrophils: 53 %
Platelets: 229 10*3/uL (ref 150–450)
RBC: 3.42 x10E6/uL — ABNORMAL LOW (ref 3.77–5.28)
RDW: 13.1 % (ref 11.7–15.4)
WBC: 7.8 10*3/uL (ref 3.4–10.8)

## 2022-11-30 LAB — LIPASE: Lipase: 28 U/L (ref 14–85)

## 2022-12-02 LAB — C DIFFICILE TOXINS A+B W/RFLX: C difficile Toxins A+B, EIA: NEGATIVE

## 2022-12-02 LAB — C DIFFICILE, CYTOTOXIN B

## 2022-12-03 ENCOUNTER — Other Ambulatory Visit: Payer: Self-pay | Admitting: Gastroenterology

## 2022-12-03 MED ORDER — BUDESONIDE 3 MG PO CPEP: 9.0000 mg | ORAL_CAPSULE | Freq: Every day | ORAL | 2 refills | Status: DC

## 2022-12-04 ENCOUNTER — Ambulatory Visit (HOSPITAL_COMMUNITY)
Admission: RE | Admit: 2022-12-04 | Discharge: 2022-12-04 | Disposition: A | Discharge status: Home / Self Care | Payer: Medicare Other | Source: Ambulatory Visit | Attending: Cardiology | Admitting: Cardiology

## 2022-12-04 ENCOUNTER — Encounter: Payer: Self-pay | Admitting: Gastroenterology

## 2022-12-04 ENCOUNTER — Encounter (HOSPITAL_COMMUNITY): Payer: Self-pay | Admitting: Cardiology

## 2022-12-04 VITALS — BP 102/70 | HR 88 | Wt 131.2 lb

## 2022-12-04 DIAGNOSIS — Z951 Presence of aortocoronary bypass graft: Secondary | ICD-10-CM | POA: Insufficient documentation

## 2022-12-04 DIAGNOSIS — I2582 Chronic total occlusion of coronary artery: Secondary | ICD-10-CM | POA: Diagnosis not present

## 2022-12-04 DIAGNOSIS — I2510 Atherosclerotic heart disease of native coronary artery without angina pectoris: Secondary | ICD-10-CM | POA: Insufficient documentation

## 2022-12-04 DIAGNOSIS — I5042 Chronic combined systolic (congestive) and diastolic (congestive) heart failure: Secondary | ICD-10-CM

## 2022-12-04 DIAGNOSIS — Z79899 Other long term (current) drug therapy: Secondary | ICD-10-CM | POA: Insufficient documentation

## 2022-12-04 DIAGNOSIS — I110 Hypertensive heart disease with heart failure: Secondary | ICD-10-CM | POA: Insufficient documentation

## 2022-12-04 DIAGNOSIS — I255 Ischemic cardiomyopathy: Secondary | ICD-10-CM | POA: Insufficient documentation

## 2022-12-04 DIAGNOSIS — Z7982 Long term (current) use of aspirin: Secondary | ICD-10-CM | POA: Insufficient documentation

## 2022-12-04 DIAGNOSIS — I5022 Chronic systolic (congestive) heart failure: Secondary | ICD-10-CM | POA: Diagnosis not present

## 2022-12-04 DIAGNOSIS — H353132 Nonexudative age-related macular degeneration, bilateral, intermediate dry stage: Secondary | ICD-10-CM | POA: Diagnosis not present

## 2022-12-04 NOTE — Progress Notes (Signed)
PCP: Harlan Stains, MD Cardiology: Dr. [name] Cancel HF Cardiology: Dr. Aundra Dubin  77 y.o. with history of CAD s/p CABG and ischemic cardiomyopathy was referred by Dr. [name] Cancel for evaluation of CHF.  Patient has a long h/o heart disease.  In 1996, she had CABG with sequential LIMA-LAD and diagonal.  Last cath in 5/21 showed totally occluded LAD with patent LIMA-LAD and diagonal, minimal disease in LCx and RCA. LV systolic function has been reduced.  She has a Research officer, political party ICD.  Last echo in 9/22 showed EF 25-30%, low normal RV function, moderate MR.  She has a chronic LBBB.  She additionally has rheumatoid arthritis and significant low back pain for which she has a spinal stimulator.   She tried to take [facility] in the past and was very tired/fatigued on it.  She was unable to tolerate it even at low dose and stopped it.  She was lightheaded when we tried to increase Coreg to 12.5 mg bid.   Today she returns for HF follow up with her husband. No significant exertional dyspnea.  No orthopnea/PND.  She has episodes of vertigo-type symptoms with a spinning sensation.  No chest pain.  No falls.  Weight is stable. Main complaint has been chronic colitis recently with diarrhea.    ECG (personally reviewed): NSR, IVCD 134 msec.  Labs (11/22): K 4.9, creatinine 0.95 Labs (2/23): K 4.7, creatinine 1.06 Labs (4/23): K 4.5, creatinine 1.21, LDL 45, HDL 27, TGs 238 Labs (7/23): LDL 42, TGs 200 Labs (1/24): K 4.4, creatinine 0.81, hgb 10.8  [employer] Jude ICD: Stable thoracic impedance, no VT   PMH: 1. HTN 2. Hyperlipidemia 3. GERD 4. Gout 5. Fe deficiency anemia 6. CAD: CABG in 1996 with sequential LIMA-diagonal and LAD.  - LHC (5/21): totally occluded LAD with patent sequent LIMA-D and LAD, no significant LCx or RCA disease.  7. Chronic systolic CHF: Ischemic cardiomyopathy.  [employer] ICD.  - Echo (9/22): EF 25-30%, low normal RV function, moderate MR.  8. Low back pain: Arthritis, has spinal stimulator.  9. Chronic  LBBB 10. Rheumatoid arthritis.  11. COPD: Prior smoker.   Social History   Socioeconomic History   Marital status: Married    Spouse name: Not on file   Number of children: 3   Years of education: Not on file   Highest education level: Not on file  Occupational History   Not on file  Tobacco Use   Smoking status: Former    Packs/day: 0.75    Years: 15.00    Total pack years: 11.25    Types: Cigarettes    Quit date: 11/20/1994    Years since quitting: 28.0   Smokeless tobacco: Never   Tobacco comments:    Quit in 1996  Vaping Use   Vaping Use: Never used  Substance and Sexual Activity   Alcohol use: Never    Alcohol/week: 0.0 standard drinks of alcohol   Drug use: Never   Sexual activity: Yes  Other Topics Concern   Not on file  Social History Narrative   2 living children, one deceased age 56   Right handed   One story home   Drinks coffee am   Social Determinants of Health   Financial Resource Strain: Not on file  Food Insecurity: Not on file  Transportation Needs: Not on file  Physical Activity: Not on file  Stress: Not on file  Social Connections: Not on file  Intimate Partner Violence: Not on file   Family History  Problem Relation Age of Onset   Heart attack Father    Heart attack Mother    Bladder Cancer Brother    Cervical cancer Daughter        ?   Colon cancer Neg Hx    ROS: All systems reviewed and negative except as per HPI.   Current Outpatient Medications  Medication Sig Dispense Refill   albuterol (PROVENTIL HFA;VENTOLIN HFA) 108 (90 Base) MCG/ACT inhaler Take 2 puffs 3 times a day and every 4 hours as needed.     allopurinol (ZYLOPRIM) 300 MG tablet Take 300 mg by mouth daily.     aspirin EC 81 MG EC tablet Take 1 tablet (81 mg total) by mouth daily.     atorvastatin (LIPITOR) 80 MG tablet Take 80 mg by mouth daily.     budesonide (ENTOCORT EC) 3 MG 24 hr capsule Take 3 capsules (9 mg total) by mouth daily. 30 capsule 2   calcium  carbonate (TUMS - DOSED IN MG ELEMENTAL CALCIUM) 500 MG chewable tablet Chew 1 tablet by mouth as needed for indigestion or heartburn.     carvedilol (COREG) 6.25 MG tablet TAKE 1 AND 1/2 TABLETS(9.375 MG) BY MOUTH TWICE DAILY 180 tablet 3   cholecalciferol (VITAMIN D3) 25 MCG (1000 UT) tablet Take 1,000 Units by mouth daily.     Cyanocobalamin 2500 MCG SUBL 1 tablet Sublingual Once a day     dexlansoprazole (DEXILANT) 60 MG capsule      diclofenac Sodium (VOLTAREN) 1 % GEL Apply topically.     etanercept (ENBREL SURECLICK) 50 MG/ML injection INJECT 50 MG Subcutaneous ONCE WEEKLY for 30 days     FARXIGA 10 MG TABS tablet TAKE 1 TABLET(10 MG) BY MOUTH DAILY BEFORE BREAKFAST 30 tablet 11   ferrous sulfate 325 (65 FE) MG tablet Take 325 mg by mouth daily with breakfast. Every other day     fexofenadine (ALLEGRA) 180 MG tablet Take 180 mg by mouth daily.     fluticasone (FLONASE) 50 MCG/ACT nasal spray Place 2 sprays into both nostrils daily.     furosemide (LASIX) 40 MG tablet Take 40 mg by mouth 2 (two) times daily.     gabapentin (NEURONTIN) 400 MG capsule TAKE 1 CAPSULE(400 MG) BY MOUTH THREE TIMES DAILY 90 capsule 5   HYDROcodone-acetaminophen (NORCO) 10-325 MG tablet Take 1 tablet by mouth every 6 (six) hours as needed for moderate pain.     icosapent Ethyl (VASCEPA) 1 g capsule Take 2 capsules (2 g total) by mouth 2 (two) times daily. 120 capsule 10   Lancets (ONETOUCH DELICA PLUS [id number]) MISC      leflunomide (ARAVA) 20 MG tablet Take 20 mg by mouth daily.     losartan (COZAAR) 100 MG tablet Take 100 mg by mouth daily.     MAGNESIUM-OXIDE 400 (240 Mg) MG tablet TAKE 1 TABLET(400 MG) BY MOUTH DAILY 30 tablet 2   meclizine (ANTIVERT) 12.5 MG tablet Take 12.5 mg by mouth 3 (three) times daily as needed for dizziness.     montelukast (SINGULAIR) 10 MG tablet Take 10 mg by mouth at bedtime.     Multiple Vitamins-Minerals (OCUVITE ADULT 50+ PO) Take 1 tablet by mouth daily.     nitroGLYCERIN  (NITROSTAT) 0.4 MG SL tablet DISSOLVE 1 TABLET UNDER THE  TONGUE EVERY 5 MINUTES AS NEEDED FOR CHEST PAIN. MAX OF 3 TABLETS IN 15 MINUTES. CALL 911 IF PAIN  PERSISTS. 75 tablet 4   ONETOUCH ULTRA test strip  spironolactone (ALDACTONE) 25 MG tablet Take 0.5 tablets (12.5 mg total) by mouth daily. 45 tablet 3   TRELEGY ELLIPTA 200-62.5-25 MCG/ACT AEPB Inhale 1 puff into the lungs daily.     No current facility-administered medications for this encounter.   Wt Readings from Last 3 Encounters:  12/04/22 59.5 kg (131 lb 3.2 oz)  11/28/22 58.9 kg (129 lb 12.8 oz)  10/11/22 62.5 kg (137 lb 12.8 oz)   BP 102/70   Pulse 88   Wt 59.5 kg (131 lb 3.2 oz)   SpO2 91%   BMI 23.61 kg/m  General: NAD Neck: No JVD, no thyromegaly or thyroid nodule.  Lungs: Clear to auscultation bilaterally with normal respiratory effort. CV: Nondisplaced PMI.  Heart regular S1/S2, no S3/S4, no murmur.  No peripheral edema.  No carotid bruit.  Normal pedal pulses.  Abdomen: Soft, nontender, no hepatosplenomegaly, no distention.  Skin: Intact without lesions or rashes.  Neurologic: Alert and oriented x 3.  Psych: Normal affect. Extremities: No clubbing or cyanosis.  HEENT: Normal.   Assessment/Plan: 1. Chronic systolic CHF: Probably ischemic cardiomyopathy (h/o occluded LAD, s/p CABG). Coronaries have been unchanged since CABG in 1996. However, EF has declined over the years and I worry that another process may be present.  Unable to obtain cardiac MRI with spinal cord stimulator. NYHA class II symptoms. She is not volume overloaded by exam or Corevue.   - Continue spironolactone 25 mg daily. BMET today.  - Continue Farxiga 10 mg daily.  - Continue Coreg 9.375 mg bid (She did not tolerate 12.5 mg bid due to orthostasis).  - Continue losartan 100 mg daily (unable to tolerate even lowest dose Entresto).  - Continue Lasix 40 mg bid. - ECG shows IVCD 134 msec, probably not wide enough to benefit significantly from  CRT upgrade.  - I will arrange for echo.  2. CAD: CABG 1996.  Cath in 5/21 due to fall in EF showed totally occluded LAD, patent RCA and LCx, patent LIMA-LAD and diagonal.  No change, no interventional target.  No chest pain.  - Continue ASA 81 daily.  - Continue atorvastatin 80 daily + Vasceapa, good lipids in 7/23.   Follow up in 4 months with APP.   Loralie Champagne  12/04/2022

## 2022-12-04 NOTE — Patient Instructions (Signed)
There has been no changes to your medications.  Your physician has requested that you have an echocardiogram. Echocardiography is a painless test that uses sound waves to create images of your heart. It provides your doctor with information about the size and shape of your heart and how well your heart's chambers and valves are working. This procedure takes approximately one hour. There are no restrictions for this procedure. Please do NOT wear cologne, perfume, aftershave, or lotions (deodorant is allowed). Please arrive 15 minutes prior to your appointment time.  Your physician recommends that you schedule a follow-up appointment in: 4 months   If you have any questions or concerns before your next appointment please send [place] a message through [facility] or call our office at [phone].    TO LEAVE A MESSAGE FOR THE NURSE SELECT OPTION 2, PLEASE LEAVE A MESSAGE INCLUDING: YOUR NAME DATE OF BIRTH CALL BACK NUMBER REASON FOR CALL**this is important as we prioritize the call backs  YOU WILL RECEIVE A CALL BACK THE SAME DAY AS LONG AS YOU CALL BEFORE 4:00 PM  At the [facility], you and your health needs are our priority. As part of our continuing mission to provide you with exceptional heart care, we have created designated Provider Care Teams. These Care Teams include your primary Cardiologist (physician) and Advanced Practice Providers (APPs- Physician Assistants and Nurse Practitioners) who all work together to provide you with the care you need, when you need it.   You may see any of the following providers on your designated Care Team at your next follow up: Dr Glori Bickers Dr Loralie Champagne Dr. Roxana Hires, NP Lyda Jester, [place] [facility] [place], [place] [name], NP Audry Riles, PharmD   Please be sure to bring in all your medications bottles to every appointment.

## 2022-12-11 ENCOUNTER — Ambulatory Visit: Payer: Medicare Other | Admitting: Gastroenterology

## 2022-12-15 ENCOUNTER — Ambulatory Visit
Admission: RE | Admit: 2022-12-15 | Discharge: 2022-12-15 | Disposition: A | Discharge status: Home / Self Care | Payer: Medicare Other | Source: Ambulatory Visit | Attending: Family Medicine | Admitting: Family Medicine

## 2022-12-15 DIAGNOSIS — R1900 Intra-abdominal and pelvic swelling, mass and lump, unspecified site: Secondary | ICD-10-CM | POA: Diagnosis not present

## 2022-12-15 DIAGNOSIS — M4854XA Collapsed vertebra, not elsewhere classified, thoracic region, initial encounter for fracture: Secondary | ICD-10-CM | POA: Diagnosis not present

## 2022-12-15 DIAGNOSIS — E1121 Type 2 diabetes mellitus with diabetic nephropathy: Secondary | ICD-10-CM | POA: Diagnosis not present

## 2022-12-15 DIAGNOSIS — I5022 Chronic systolic (congestive) heart failure: Secondary | ICD-10-CM | POA: Diagnosis not present

## 2022-12-15 DIAGNOSIS — E785 Hyperlipidemia, unspecified: Secondary | ICD-10-CM | POA: Diagnosis not present

## 2022-12-15 DIAGNOSIS — J449 Chronic obstructive pulmonary disease, unspecified: Secondary | ICD-10-CM | POA: Diagnosis not present

## 2022-12-15 DIAGNOSIS — I10 Essential (primary) hypertension: Secondary | ICD-10-CM | POA: Diagnosis not present

## 2022-12-15 DIAGNOSIS — R197 Diarrhea, unspecified: Secondary | ICD-10-CM | POA: Diagnosis not present

## 2022-12-15 DIAGNOSIS — K5730 Diverticulosis of large intestine without perforation or abscess without bleeding: Secondary | ICD-10-CM | POA: Diagnosis not present

## 2022-12-15 MED ORDER — IOPAMIDOL (ISOVUE-300) INJECTION 61%
100.0000 mL | Freq: Once | INTRAVENOUS | Status: AC | PRN
  Administered 2022-12-15: 100 mL via INTRAVENOUS

## 2022-12-18 ENCOUNTER — Ambulatory Visit
Admission: RE | Admit: 2022-12-18 | Discharge: 2022-12-18 | Disposition: A | Discharge status: Home / Self Care | Payer: Medicare Other | Source: Ambulatory Visit | Attending: Family Medicine | Admitting: Family Medicine

## 2022-12-18 DIAGNOSIS — R921 Mammographic calcification found on diagnostic imaging of breast: Secondary | ICD-10-CM

## 2022-12-21 DIAGNOSIS — M7130 Other bursal cyst, unspecified site: Secondary | ICD-10-CM | POA: Diagnosis not present

## 2022-12-21 DIAGNOSIS — M0579 Rheumatoid arthritis with rheumatoid factor of multiple sites without organ or systems involvement: Secondary | ICD-10-CM | POA: Diagnosis not present

## 2022-12-21 DIAGNOSIS — M1009 Idiopathic gout, multiple sites: Secondary | ICD-10-CM | POA: Diagnosis not present

## 2022-12-21 DIAGNOSIS — Z6822 Body mass index (BMI) 22.0-22.9, adult: Secondary | ICD-10-CM | POA: Diagnosis not present

## 2022-12-21 DIAGNOSIS — Z79899 Other long term (current) drug therapy: Secondary | ICD-10-CM | POA: Diagnosis not present

## 2022-12-21 DIAGNOSIS — M1991 Primary osteoarthritis, unspecified site: Secondary | ICD-10-CM | POA: Diagnosis not present

## 2022-12-26 DIAGNOSIS — R2232 Localized swelling, mass and lump, left upper limb: Secondary | ICD-10-CM | POA: Diagnosis not present

## 2022-12-29 ENCOUNTER — Ambulatory Visit (HOSPITAL_COMMUNITY)
Admission: RE | Admit: 2022-12-29 | Discharge: 2022-12-29 | Disposition: A | Discharge status: Home / Self Care | Payer: Medicare Other | Source: Ambulatory Visit | Attending: Cardiology | Admitting: Cardiology

## 2022-12-29 ENCOUNTER — Encounter: Payer: Self-pay | Admitting: Cardiology

## 2022-12-29 DIAGNOSIS — I2510 Atherosclerotic heart disease of native coronary artery without angina pectoris: Secondary | ICD-10-CM | POA: Diagnosis not present

## 2022-12-29 DIAGNOSIS — I110 Hypertensive heart disease with heart failure: Secondary | ICD-10-CM | POA: Insufficient documentation

## 2022-12-29 DIAGNOSIS — I447 Left bundle-branch block, unspecified: Secondary | ICD-10-CM | POA: Diagnosis not present

## 2022-12-29 DIAGNOSIS — I5042 Chronic combined systolic (congestive) and diastolic (congestive) heart failure: Secondary | ICD-10-CM | POA: Insufficient documentation

## 2022-12-29 DIAGNOSIS — I083 Combined rheumatic disorders of mitral, aortic and tricuspid valves: Secondary | ICD-10-CM | POA: Diagnosis not present

## 2022-12-29 DIAGNOSIS — E785 Hyperlipidemia, unspecified: Secondary | ICD-10-CM | POA: Insufficient documentation

## 2022-12-30 LAB — ECHOCARDIOGRAM COMPLETE
AR max vel: 2.64 cm2
AV Area VTI: 2.17 cm2
AV Area mean vel: 2.36 cm2
AV Mean grad: 5 mmHg
AV Peak grad: 8.9 mmHg
Ao pk vel: 1.49 m/s
Area-P 1/2: 4.49 cm2
Calc EF: 31.9 %
MV VTI: 2.18 cm2
P 1/2 time: 429 msec
S' Lateral: 4.5 cm
Single Plane A2C EF: 30.4 %
Single Plane A4C EF: 32 %

## 2023-01-01 DIAGNOSIS — M47812 Spondylosis without myelopathy or radiculopathy, cervical region: Secondary | ICD-10-CM | POA: Diagnosis not present

## 2023-01-01 DIAGNOSIS — Z9689 Presence of other specified functional implants: Secondary | ICD-10-CM | POA: Diagnosis not present

## 2023-01-01 DIAGNOSIS — M961 Postlaminectomy syndrome, not elsewhere classified: Secondary | ICD-10-CM | POA: Diagnosis not present

## 2023-01-02 NOTE — Progress Notes (Unsigned)
GI Office Note    Referring Provider: Harlan Stains, MD Primary Care Physician:  Harlan Stains, MD  Primary Gastroenterologist: Garfield Cornea, MD   Chief Complaint   No chief complaint on file.   History of Present Illness   Katherine Larson is a 77 y.o. female presenting today for follow up. Last seen 11/28/22.   At that time was having diarrhea since 09/2022. Initially stools black and then noted brbpr. Recent antibiotic use. Cdiff and GI Profile all negative. History of lymphocytic colitis. Labs with slightly low but improved Hgb of 10.8. she was started on entocort 40m daily.  Colonoscopy June 2021: - Non-bleeding internal hemorrhoids. - Diverticulosis in the sigmoid colon and in the descending colon. Status post segmental biopsy. - The examination was otherwise normal on direct and retroflexion views. Normal appearing terminal ileum. -Biopsy showed chronic lymphocytic colitis   EGD 05/2021: normal esophagus, small hiatal hernia. Abnormal hypopharyngeal area, verrucous in appearance, followed with ENT.    Medications   Current Outpatient Medications  Medication Sig Dispense Refill   albuterol (PROVENTIL HFA;VENTOLIN HFA) 108 (90 Base) MCG/ACT inhaler Take 2 puffs 3 times a day and every 4 hours as needed.     allopurinol (ZYLOPRIM) 300 MG tablet Take 300 mg by mouth daily.     aspirin EC 81 MG EC tablet Take 1 tablet (81 mg total) by mouth daily.     atorvastatin (LIPITOR) 80 MG tablet Take 80 mg by mouth daily.     budesonide (ENTOCORT EC) 3 MG 24 hr capsule Take 3 capsules (9 mg total) by mouth daily. 30 capsule 2   calcium carbonate (TUMS - DOSED IN MG ELEMENTAL CALCIUM) 500 MG chewable tablet Chew 1 tablet by mouth as needed for indigestion or heartburn.     carvedilol (COREG) 6.25 MG tablet TAKE 1 AND 1/2 TABLETS(9.375 MG) BY MOUTH TWICE DAILY 180 tablet 3   cholecalciferol (VITAMIN D3) 25 MCG (1000 UT) tablet Take 1,000 Units by mouth daily.      Cyanocobalamin 2500 MCG SUBL 1 tablet Sublingual Once a day     dexlansoprazole (DEXILANT) 60 MG capsule      diclofenac Sodium (VOLTAREN) 1 % GEL Apply topically.     etanercept (ENBREL SURECLICK) 50 MG/ML injection INJECT 50 MG Subcutaneous ONCE WEEKLY for 30 days     FARXIGA 10 MG TABS tablet TAKE 1 TABLET(10 MG) BY MOUTH DAILY BEFORE BREAKFAST 30 tablet 11   ferrous sulfate 325 (65 FE) MG tablet Take 325 mg by mouth daily with breakfast. Every other day     fexofenadine (ALLEGRA) 180 MG tablet Take 180 mg by mouth daily.     fluticasone (FLONASE) 50 MCG/ACT nasal spray Place 2 sprays into both nostrils daily.     furosemide (LASIX) 40 MG tablet Take 40 mg by mouth 2 (two) times daily.     gabapentin (NEURONTIN) 400 MG capsule TAKE 1 CAPSULE(400 MG) BY MOUTH THREE TIMES DAILY 90 capsule 5   HYDROcodone-acetaminophen (NORCO) 10-325 MG tablet Take 1 tablet by mouth every 6 (six) hours as needed for moderate pain.     icosapent Ethyl (VASCEPA) 1 g capsule Take 2 capsules (2 g total) by mouth 2 (two) times daily. 120 capsule 10   Lancets (ONETOUCH DELICA PLUS [id number] MISC      leflunomide (ARAVA) 20 MG tablet Take 20 mg by mouth daily.     losartan (COZAAR) 100 MG tablet Take 100 mg by mouth daily.  MAGNESIUM-OXIDE 400 (240 Mg) MG tablet TAKE 1 TABLET(400 MG) BY MOUTH DAILY 30 tablet 2   meclizine (ANTIVERT) 12.5 MG tablet Take 12.5 mg by mouth 3 (three) times daily as needed for dizziness.     montelukast (SINGULAIR) 10 MG tablet Take 10 mg by mouth at bedtime.     Multiple Vitamins-Minerals (OCUVITE ADULT 50+ PO) Take 1 tablet by mouth daily.     nitroGLYCERIN (NITROSTAT) 0.4 MG SL tablet DISSOLVE 1 TABLET UNDER THE  TONGUE EVERY 5 MINUTES AS NEEDED FOR CHEST PAIN. MAX OF 3 TABLETS IN 15 MINUTES. CALL 911 IF PAIN  PERSISTS. 75 tablet 4   ONETOUCH ULTRA test strip      spironolactone (ALDACTONE) 25 MG tablet Take 0.5 tablets (12.5 mg total) by mouth daily. 45 tablet 3   TRELEGY ELLIPTA  200-62.5-25 MCG/ACT AEPB Inhale 1 puff into the lungs daily.     No current facility-administered medications for this visit.    Allergies   Allergies as of 01/03/2023 - Review Complete 12/04/2022  Allergen Reaction Noted   Biaxin [clarithromycin] Rash and Other (See Comments) 02/02/2010   Penicillins Rash and Other (See Comments) 02/02/2010   Hydroxychloroquine Other (See Comments) 10/11/2022   Sulfasalazine Other (See Comments) 10/11/2022   Losartan potassium  10/09/2021     Past Medical History   Past Medical History:  Diagnosis Date   Anemia    anemia of chronic disease +/- IDA followed by Dr. Earlie Server. received iron infusions in the past   Asthma    Borderline glaucoma    CAD (coronary artery disease)    a. s/p CABG 1996. b. low risk nuc 2011. c. LHC 04/2016 due to drop in EF -> occluded native LAD,  widely patent sequential LIMA-D1-LAD.   Chronic kidney disease    "mild stage of kidney disease"   Chronic systolic CHF (congestive heart failure) (HCC)    Complication of anesthesia    DM2 (diabetes mellitus, type 2) ([place])    not on any medicine for this at this time, 05/2016   Dyspnea    with exertion   GERD (gastroesophageal reflux disease)    Gout    History of blood transfusion    History of hiatal hernia    in 20s   History of pneumonia    March, 2016   HLD (hyperlipidemia)    HTN (hypertension)    Hyperthyroidism 01/21/2016   Inappropriate sinus tachycardia    LBBB (left bundle branch block)    a. Seen in 04/2016   Lymphocytic colitis 04/2020   Macular degeneration    left   Myocardial infarction [facility])    1996   Neuropathy    Neuropathy    NICM (nonischemic cardiomyopathy) ([place])    a. Dx 04/2016 - EF 25-30%, diffuse HK, elevated LVEDP, mild MR, mod LAE.   Normocytic anemia 10/17/2021   NSVT (nonsustained ventricular tachycardia) (HCC)    PAT (paroxysmal atrial tachycardia)    Pneumonia    PONV (postoperative nausea and vomiting)    "after just about  every surgery I've had"   Rheumatoid arthritis ([place])    RA- hands   Seasonal allergies     Past Surgical History   Past Surgical History:  Procedure Laterality Date   ABDOMINAL HYSTERECTOMY     APPENDECTOMY     BACK SURGERY     BIOPSY  04/27/2020   Procedure: BIOPSY;  Surgeon: Daneil Dolin, MD;  [facility];  Service: Endoscopy;;  ascending colon  biopsy   CARDIAC CATHETERIZATION N/A 05/01/2016   Procedure: Right/Left Heart Cath and Coronary/Graft Angiography;  Surgeon: Leonie Man, MD;  Location: [place] [facility];  Service: Cardiovascular;  Laterality: N/A;   CHOLECYSTECTOMY     COLONOSCOPY  11/2011   Dr. Magod:ext/int hemorrhoids, diverticulosis sigmoid colon and distal desc colon, mid-desc colon hyperplastic polyps.    COLONOSCOPY WITH PROPOFOL N/A 04/27/2020   Rourk: Diverticulosis, hemorrhoids, normal terminal ileum.  Random colon biopsies significant for chronic lymphocytic colitis. No repeat due to age.   CORONARY ARTERY BYPASS GRAFT  1996   2 vessels   ESOPHAGOGASTRODUODENOSCOPY  11/2011   Dr. Watt Climes: small hiatal hernia, one non-bleeding superficial gastric ulcer, medium-sized diverticulum in area of papilla   ESOPHAGOGASTRODUODENOSCOPY N/A 12/29/2015   Dr. Gala Romney: Chronic inactive gastritis, normal esophagus status post dilation, duodenal diverticula   ESOPHAGOGASTRODUODENOSCOPY (EGD) WITH PROPOFOL N/A 07/24/2019   Dr. Gala Romney: Normal esophagus status post empiric dilation, small hiatal hernia, large D2 diverticulum   ESOPHAGOGASTRODUODENOSCOPY (EGD) WITH PROPOFOL N/A 06/03/2021   Surgeon: Daneil Dolin, MD; normal esophagus, normal stomach, normal duodenum.  Abnormal hypopharyngeal mucosa bilaterally just distal to the base of the tongue (right side greater than left), overlying mucosa appeared irregular and somewhat verrucous in appearance.  Recommended ENT evaluation.   EYE SURGERY Bilateral    cataract removal   FOOT SURGERY     removal bone spur    FRACTURE SURGERY Right    arm   GIVENS CAPSULE STUDY  11/2012   Dr. Watt Climes: minimal gastritis, normal small bowel capsule   HERNIA REPAIR     hiatal hernia   ICD IMPLANT N/A 02/04/2021   Procedure: ICD IMPLANT;  Surgeon: Evans Lance, MD;  Location: [place] [facility];  Service: Cardiovascular;  Laterality: N/A;   MALONEY DILATION N/A 07/24/2019   Procedure: Venia Minks DILATION;  Surgeon: Daneil Dolin, MD;  [facility];  Service: Endoscopy;  Laterality: N/A;   MALONEY DILATION N/A 06/03/2021   Procedure: Venia Minks DILATION;  Surgeon: Daneil Dolin, MD;  [facility];  Service: Endoscopy;  Laterality: N/A;   MAXIMUM ACCESS (MAS)POSTERIOR LUMBAR INTERBODY FUSION (PLIF) 1 LEVEL N/A 08/14/2013   Procedure:  MAXIMUM ACCESS SURGERY(MAS) POSTERIOR LUMBAR INTERBODY FUSION LUMBAR THREE-FOUR ;  Surgeon: Eustace Moore, MD;  Location: [place] [facility];  Service: Neurosurgery;  Laterality: N/A;   MAXIMUM ACCESS SURGERY(MAS) POSTERIOR LUMBAR INTERBODY FUSION LUMBAR THREE-FOUR    PTCA     RIGHT/LEFT HEART CATH AND CORONARY ANGIOGRAPHY N/A 03/26/2020   Procedure: RIGHT/LEFT HEART CATH AND CORONARY ANGIOGRAPHY;  Surgeon: Leonie Man, MD;  Location: [place] [facility];  Service: Cardiovascular;  Laterality: N/A;   SPINAL CORD STIMULATOR BATTERY EXCHANGE N/A 01/19/2021   Procedure: Spinal cord stimulator battery change;  Surgeon: Reece Agar, MD;  Location: [place];  Service: Neurosurgery;  Laterality: N/A;   SPINAL CORD STIMULATOR INSERTION N/A 06/16/2016   Procedure: LUMBAR SPINAL CORD STIMULATOR INSERTION;  Surgeon: Clydell Hakim, MD;  Location: [place] [facility];  Service: Neurosurgery;  Laterality: N/A;  LUMBAR SPINAL CORD STIMULATOR INSERTION   tens  05/2016   TONSILLECTOMY      Past Family History   Family History  Problem Relation Age of Onset   Heart attack Father    Heart attack Mother    Bladder Cancer Brother    Cervical cancer Daughter        ?   Colon cancer Neg Hx  Past Social History   Social History   Socioeconomic History   Marital status: Married    Spouse name: Not on file   Number of children: 3   Years of education: Not on file   Highest education level: Not on file  Occupational History   Not on file  Tobacco Use   Smoking status: Former    Packs/day: 0.75    Years: 15.00    Total pack years: 11.25    Types: Cigarettes    Quit date: 11/20/1994    Years since quitting: 28.1   Smokeless tobacco: Never   Tobacco comments:    Quit in 1996  Vaping Use   Vaping Use: Never used  Substance and Sexual Activity   Alcohol use: Never    Alcohol/week: 0.0 standard drinks of alcohol   Drug use: Never   Sexual activity: Yes  Other Topics Concern   Not on file  Social History Narrative   2 living children, one deceased age 73   Right handed   One story home   Drinks coffee am   Social Determinants of Health   Financial Resource Strain: Not on file  Food Insecurity: Not on file  Transportation Needs: Not on file  Physical Activity: Not on file  Stress: Not on file  Social Connections: Not on file  Intimate Partner Violence: Not on file    Review of Systems   General: Negative for anorexia, weight loss, fever, chills, fatigue, weakness. ENT: Negative for hoarseness, difficulty swallowing , nasal congestion. CV: Negative for chest pain, angina, palpitations, dyspnea on exertion, peripheral edema.  Respiratory: Negative for dyspnea at rest, dyspnea on exertion, cough, sputum, wheezing.  GI: See history of present illness. GU:  Negative for dysuria, hematuria, urinary incontinence, urinary frequency, nocturnal urination.  Endo: Negative for unusual weight change.     Physical Exam   There were no vitals taken for this visit.   General: Well-nourished, well-developed in no acute distress.  Eyes: No icterus. Mouth: Oropharyngeal mucosa moist and pink , no lesions erythema or exudate. Lungs: Clear to auscultation  bilaterally.  Heart: Regular rate and rhythm, no murmurs rubs or gallops.  Abdomen: Bowel sounds are normal, nontender, nondistended, no hepatosplenomegaly or masses,  no abdominal bruits or hernia , no rebound or guarding.  Rectal: ***  Extremities: No lower extremity edema. No clubbing or deformities. Neuro: Alert and oriented x 4   Skin: Warm and dry, no jaundice.   Psych: Alert and cooperative, normal mood and affect.  Labs   *** Imaging Studies   ECHOCARDIOGRAM COMPLETE  Result Date: 12/30/2022    ECHOCARDIOGRAM REPORT   Patient Name:   MAYCEL VANSCOYK Date of Exam: 12/29/2022 Medical Rec #:  [id number]       Height:       62.5 in Accession #:    [id number]      Weight:       131.2 lb Date of Birth:  1946/07/06      BSA:          1.608 m Patient Age:    24 years        BP:           102/60 mmHg Patient Gender: F               HR:           83 bpm. Exam Location:  Outpatient Procedure: 2D Echo, Color Doppler and Cardiac Doppler Indications:  Chronic combined systolic and diastolic heart failure [id number]  History:        Patient has prior history of Echocardiogram examinations. CHF,                 CAD, Arrythmias:LBBB and NSVT; Risk Factors:Hypertension and                 HLD.  Sonographer:    L. [place] Referring Phys: [place]  1. Left ventricular ejection fraction, by estimation, is 30 to 35%. The left ventricle has moderately decreased function. The left ventricle demonstrates regional wall motion abnormalities (see scoring diagram/findings for description). The left ventricular internal cavity size was mildly dilated. Left ventricular diastolic parameters are consistent with Grade I diastolic dysfunction (impaired relaxation). There is Paradoxical septal motion with frequent ectopy.  2. Right ventricular systolic function is normal. The right ventricular size is normal. There is moderately elevated pulmonary artery systolic pressure.  3. Left atrial size  was mild to moderately dilated.  4. The mitral valve is grossly normal. Trivial mitral valve regurgitation. No evidence of mitral stenosis. Moderate to severe mitral annular calcification.  5. Tricuspid valve regurgitation is moderate.  6. The aortic valve is tricuspid. Aortic valve regurgitation is mild. No aortic stenosis is present.  7. The inferior vena cava is normal in size with greater than 50% respiratory variability, suggesting right atrial pressure of 3 mmHg. Comparison(s): No significant change from prior study. FINDINGS  Left Ventricle: Left ventricular ejection fraction, by estimation, is 30 to 35%. The left ventricle has moderately decreased function. The left ventricle demonstrates regional wall motion abnormalities. The left ventricular internal cavity size was mildly dilated. There is no left ventricular hypertrophy. Paradoxical septal motion with frequent ectopy. Left ventricular diastolic parameters are consistent with Grade I diastolic dysfunction (impaired relaxation). The ratio of pulmonic flow to systemic flow (Qp/Qs ratio) is 0.42.  LV Wall Scoring: The anterior septum and basal inferoseptal segment are akinetic. The entire anterior wall, mid and distal lateral wall, entire apex, entire inferior wall, mid anterolateral segment, and mid inferoseptal segment are hypokinetic. The basal inferolateral segment and basal anterolateral segment are normal. Right Ventricle: The right ventricular size is normal. No increase in right ventricular wall thickness. Right ventricular systolic function is normal. There is moderately elevated pulmonary artery systolic pressure. The tricuspid regurgitant velocity is 3.37 m/s, and with an assumed right atrial pressure of 3 mmHg, the estimated right ventricular systolic pressure is [id number] mmHg. Left Atrium: Left atrial size was mild to moderately dilated. Right Atrium: Right atrial size was normal in size. Pericardium: There is no evidence of pericardial effusion.  Mitral Valve: The mitral valve is grossly normal. Moderate to severe mitral annular calcification. Trivial mitral valve regurgitation. No evidence of mitral valve stenosis. MV peak gradient, 7.4 mmHg. The mean mitral valve gradient is 4.0 mmHg. Tricuspid Valve: The tricuspid valve is normal in structure. Tricuspid valve regurgitation is moderate . No evidence of tricuspid stenosis. Aortic Valve: The aortic valve is tricuspid. Aortic valve regurgitation is mild. Aortic regurgitation PHT measures 429 msec. No aortic stenosis is present. Aortic valve mean gradient measures 5.0 mmHg. Aortic valve peak gradient measures 8.9 mmHg. Aortic  valve area, by VTI measures 2.17 cm. Pulmonic Valve: The pulmonic valve was grossly normal. Pulmonic valve regurgitation is trivial. No evidence of pulmonic stenosis. Aorta: The aortic root, ascending aorta and aortic arch are all structurally normal, with no evidence of dilitation or obstruction. Venous: The inferior vena  cava is normal in size with greater than 50% respiratory variability, suggesting right atrial pressure of 3 mmHg. IAS/Shunts: The atrial septum is grossly normal. The ratio of pulmonic flow to systemic flow (Qp/Qs ratio) is 0.42. Additional Comments: A device lead is visualized.  LEFT VENTRICLE PLAX 2D LVIDd:         5.30 cm      Diastology LVIDs:         4.50 cm      LV e' medial:    4.30 cm/s LV PW:         1.20 cm      LV E/e' medial:  16.1 LV IVS:        0.80 cm      LV e' lateral:   6.65 cm/s LVOT diam:     1.90 cm      LV E/e' lateral: 10.4 LV SV:         74 LV SV Index:   46 LVOT Area:     2.84 cm  LV Volumes (MOD) LV vol d, MOD A2C: 141.0 ml LV vol d, MOD A4C: 150.0 ml LV vol s, MOD A2C: 98.2 ml LV vol s, MOD A4C: 102.0 ml LV SV MOD A2C:     42.8 ml LV SV MOD A4C:     150.0 ml LV SV MOD BP:      47.6 ml RIGHT VENTRICLE RV Basal diam:  3.20 cm RV Mid diam:    2.00 cm RV S prime:     6.49 cm/s RVOT diam:      1.90 cm TAPSE (M-mode): 1.4 cm LEFT ATRIUM              Index        RIGHT ATRIUM           Index LA diam:        3.70 cm 2.30 cm/m   RA Area:     11.20 cm LA Vol (A2C):   62.4 ml 38.81 ml/m  RA Volume:   22.00 ml  13.68 ml/m LA Vol (A4C):   49.9 ml 31.04 ml/m LA Biplane Vol: 55.5 ml 34.52 ml/m  AORTIC VALVE                     PULMONIC VALVE AV Area (Vmax):    2.64 cm      PV Area (Vmax):   0.88 cm AV Area (Vmean):   2.36 cm      PV Area (Vmean):  1.00 cm AV Area (VTI):     2.17 cm      PV Area (VTI):    0.95 cm AV Vmax:           149.00 cm/s   PV Vmax:          1.60 m/s AV Vmean:          102.900 cm/s  PV Vmean:         105.000 cm/s AV VTI:            0.340 m       PV VTI:           0.360 m AV Peak Grad:      8.9 mmHg      PV Peak grad:     10.2 mmHg AV Mean Grad:      5.0 mmHg      PV Mean grad:     5.0 mmHg LVOT Vmax:  139.00 cm/s   PR End Diast Vel: 4.33 msec LVOT Vmean:        85.800 cm/s   RVOT Peak grad:   1 mmHg LVOT VTI:          0.260 m LVOT/AV VTI ratio: 0.76 AI PHT:            429 msec  AORTA Ao Root diam: 2.70 cm Ao Asc diam:  3.10 cm MITRAL VALVE                TRICUSPID VALVE MV Area (PHT): 4.49 cm     TR Peak grad:   45.4 mmHg MV Area VTI:   2.18 cm     TR Vmax:        337.00 cm/s MV Peak grad:  7.4 mmHg MV Mean grad:  4.0 mmHg     SHUNTS MV Vmax:       1.36 m/s     Systemic VTI:  0.26 m MV Vmean:      96.3 cm/s    Systemic Diam: 1.90 cm MV Decel Time: 169 msec     Pulmonic VTI:  0.120 m MV E velocity: 69.40 cm/s   Pulmonic Diam: 1.90 cm MV A velocity: 127.00 cm/s  Qp/Qs:         0.46 MV E/A ratio:  0.55 Buford Dresser MD Electronically signed by Buford Dresser MD Signature Date/Time: 12/30/2022/10:04:38 AM    Final    MM DIAG BREAST TOMO BILATERAL  Result Date: 12/18/2022 CLINICAL DATA:  Follow-up for probably benign calcifications in the RIGHT breast. These calcifications were initially identified in January 2023. EXAM: DIGITAL DIAGNOSTIC BILATERAL MAMMOGRAM WITH TOMOSYNTHESIS TECHNIQUE: Bilateral digital  diagnostic mammography and breast tomosynthesis was performed. COMPARISON:  Previous exam(s). ACR Breast Density Category b: There are scattered areas of fibroglandular density. FINDINGS: The grouped coarse and punctate calcifications within the upper-outer quadrant of the RIGHT breast, at posterior depth, are without significant interval change. There are no new dominant masses, suspicious calcifications or secondary signs of malignancy elsewhere within either breast. IMPRESSION: 1. Grouped coarse and punctate calcifications within the upper-outer quadrant of the RIGHT breast, at posterior depth, without significant interval change. This is a probably benign finding for which additional follow-up diagnostic mammogram is recommended in 12 months to ensure 2 year stability. 2. No evidence of malignancy within the LEFT breast. RECOMMENDATION: Bilateral diagnostic mammogram, with RIGHT breast magnification views, in 12 months. I have discussed the findings and recommendations with the patient. If applicable, a reminder letter will be sent to the patient regarding the next appointment. BI-RADS CATEGORY  3: Probably benign. Electronically Signed   By: Franki Cabot M.D.   On: 12/18/2022 14:35  CT ABDOMEN PELVIS W CONTRAST  Result Date: 12/17/2022 CLINICAL DATA:  Diarrhea for 2 months. Palpable pelvic mass on exam. EXAM: CT ABDOMEN AND PELVIS WITH CONTRAST TECHNIQUE: Multidetector CT imaging of the abdomen and pelvis was performed using the standard protocol following bolus administration of intravenous contrast. RADIATION DOSE REDUCTION: This exam was performed according to the departmental dose-optimization program which includes automated exposure control, adjustment of the mA and/or kV according to patient size and/or use of iterative reconstruction technique. CONTRAST:  120m ISOVUE-300 IOPAMIDOL (ISOVUE-300) INJECTION 61% COMPARISON:  02/18/2020 FINDINGS: Lower Chest: No acute findings. Hepatobiliary: No hepatic  masses identified. Prior cholecystectomy. No evidence of biliary obstruction. Pancreas:  No mass or inflammatory changes. Spleen: Within normal limits in size and appearance. Adrenals/Urinary Tract: No suspicious masses identified. No evidence  of ureteral calculi or hydronephrosis. Stomach/Bowel: No evidence of obstruction, inflammatory process or abnormal fluid collections. Diverticulosis is seen mainly involving the descending and sigmoid colon, however there is no evidence of diverticulitis. Vascular/Lymphatic: No pathologically enlarged lymph nodes. No acute vascular findings. Aortic atherosclerotic calcification incidentally noted. Reproductive: Prior hysterectomy noted. Adnexal regions are unremarkable in appearance. Other:  None. Musculoskeletal: No suspicious bone lesions identified. Stable old T11 vertebral body compression fracture. Spinal neurostimulator device again seen. IMPRESSION: No acute findings. Colonic diverticulosis, without radiographic evidence of diverticulitis. No evidence pelvic or abdominal soft tissue mass. Aortic Atherosclerosis (ICD10-I70.0). Electronically Signed   By: Marlaine Hind M.D.   On: 12/17/2022 12:59    Assessment       PLAN   ***   Laureen Ochs. Bobby Rumpf, [place], [facility]

## 2023-01-03 ENCOUNTER — Ambulatory Visit (INDEPENDENT_AMBULATORY_CARE_PROVIDER_SITE_OTHER): Payer: Medicare Other | Admitting: Gastroenterology

## 2023-01-03 ENCOUNTER — Encounter: Payer: Self-pay | Admitting: Gastroenterology

## 2023-01-03 ENCOUNTER — Telehealth (HOSPITAL_COMMUNITY): Payer: Self-pay

## 2023-01-03 VITALS — BP 115/69 | HR 83 | Temp 98.3°F | Ht 62.5 in | Wt 132.2 lb

## 2023-01-03 DIAGNOSIS — K5903 Drug induced constipation: Secondary | ICD-10-CM | POA: Diagnosis not present

## 2023-01-03 MED ORDER — LINACLOTIDE 72 MCG PO CAPS: 72.0000 ug | ORAL_CAPSULE | Freq: Every day | ORAL | 0 refills | Status: DC

## 2023-01-03 NOTE — Telephone Encounter (Signed)
Patient aware of results 

## 2023-01-03 NOTE — Patient Instructions (Addendum)
Continue stool softener two daily. Add Benefiber two teaspoons once daily. If tolerated, increase to twice daily in one week.  I have given you samples of Linzess 82mg. If you find your bowels are not moving well after adding Benefiber, you can try Linzess one daily on empty stomach. Let me know if you need a prescription.  Continue to drink plenty of fluids.  Return office visit in three months, but call sooner if you are having any further issues.

## 2023-01-08 ENCOUNTER — Inpatient Hospital Stay: Payer: Medicare Other | Attending: Hematology

## 2023-01-08 DIAGNOSIS — D649 Anemia, unspecified: Secondary | ICD-10-CM

## 2023-01-08 DIAGNOSIS — Z87891 Personal history of nicotine dependence: Secondary | ICD-10-CM | POA: Insufficient documentation

## 2023-01-08 DIAGNOSIS — D72829 Elevated white blood cell count, unspecified: Secondary | ICD-10-CM | POA: Diagnosis not present

## 2023-01-08 DIAGNOSIS — Z79899 Other long term (current) drug therapy: Secondary | ICD-10-CM | POA: Insufficient documentation

## 2023-01-08 DIAGNOSIS — D631 Anemia in chronic kidney disease: Secondary | ICD-10-CM

## 2023-01-08 DIAGNOSIS — D509 Iron deficiency anemia, unspecified: Secondary | ICD-10-CM | POA: Insufficient documentation

## 2023-01-08 DIAGNOSIS — Z7982 Long term (current) use of aspirin: Secondary | ICD-10-CM | POA: Diagnosis not present

## 2023-01-08 DIAGNOSIS — D72828 Other elevated white blood cell count: Secondary | ICD-10-CM

## 2023-01-08 LAB — COMPREHENSIVE METABOLIC PANEL
ALT: 21 U/L (ref 0–44)
AST: 23 U/L (ref 15–41)
Albumin: 3.8 g/dL (ref 3.5–5.0)
Alkaline Phosphatase: 102 U/L (ref 38–126)
Anion gap: 12 (ref 5–15)
BUN: 27 mg/dL — ABNORMAL HIGH (ref 8–23)
CO2: 25 mmol/L (ref 22–32)
Calcium: 9 mg/dL (ref 8.9–10.3)
Chloride: 101 mmol/L (ref 98–111)
Creatinine, Ser: 0.93 mg/dL (ref 0.44–1.00)
GFR, Estimated: >60 mL/min (ref >60)
Glucose, Bld: 114 mg/dL — ABNORMAL HIGH (ref 70–99)
Potassium: 4.2 mmol/L (ref 3.5–5.1)
Sodium: 138 mmol/L (ref 135–145)
Total Bilirubin: 0.6 mg/dL (ref 0.3–1.2)
Total Protein: 7.6 g/dL (ref 6.5–8.1)

## 2023-01-08 LAB — CBC WITH DIFFERENTIAL/PLATELET
Abs Immature Granulocytes: 0.03 10*3/uL (ref 0.00–0.07)
Basophils Absolute: 0 10*3/uL (ref 0.0–0.1)
Basophils Relative: 0 %
Eosinophils Absolute: 0.3 10*3/uL (ref 0.0–0.5)
Eosinophils Relative: 3 %
HCT: 33.7 % — ABNORMAL LOW (ref 36.0–46.0)
Hemoglobin: 10.7 g/dL — ABNORMAL LOW (ref 12.0–15.0)
Immature Granulocytes: 0 %
Lymphocytes Relative: 28 %
Lymphs Abs: 2.5 10*3/uL (ref 0.7–4.0)
MCH: 31.5 pg (ref 26.0–34.0)
MCHC: 31.8 g/dL (ref 30.0–36.0)
MCV: 99.1 fL (ref 80.0–100.0)
Monocytes Absolute: 0.7 10*3/uL (ref 0.1–1.0)
Monocytes Relative: 8 %
Neutro Abs: 5.4 10*3/uL (ref 1.7–7.7)
Neutrophils Relative %: 61 %
Platelets: 263 10*3/uL (ref 150–400)
RBC: 3.4 MIL/uL — ABNORMAL LOW (ref 3.87–5.11)
RDW: 14.7 % (ref 11.5–15.5)
WBC: 8.9 10*3/uL (ref 4.0–10.5)
nRBC: 0 % (ref 0.0–0.2)

## 2023-01-08 LAB — IRON AND TIBC
Iron: 59 ug/dL (ref 28–170)
Saturation Ratios: 15 % (ref 10.4–31.8)
TIBC: 392 ug/dL (ref 250–450)
UIBC: 333 ug/dL

## 2023-01-08 LAB — FERRITIN: Ferritin: 399 ng/mL — ABNORMAL HIGH (ref 11–307)

## 2023-01-09 DIAGNOSIS — U071 COVID-19: Secondary | ICD-10-CM | POA: Diagnosis not present

## 2023-01-09 DIAGNOSIS — M1711 Unilateral primary osteoarthritis, right knee: Secondary | ICD-10-CM | POA: Diagnosis not present

## 2023-01-09 DIAGNOSIS — M170 Bilateral primary osteoarthritis of knee: Secondary | ICD-10-CM | POA: Diagnosis not present

## 2023-01-12 NOTE — Progress Notes (Unsigned)
[address] [place], [place] [address]   CLINIC:  Medical Oncology/Hematology  PCP:  Harlan Stains, MD [place] [address] [phone]   REASON FOR VISIT:  Follow-up for iron deficiency anemia and leukocytosis   CURRENT THERAPY: Intermittent IV iron infusions (Feraheme on 10/21/2021 and 10/28/2021)  INTERVAL HISTORY:   Ms. Rubsam 77 y.o. female returns for routine follow-up of her iron deficiency anemia/anemia of chronic disease and leukocytosis.  She was last evaluated via telemedicine visit by Tarri Abernethy PA-C on 09/15/2022.   At today's visit, she reports feeling fair.  She tested positive for COVID-19 one week ago (01/08/2023).  She reports significant issues with alternating diarrhea and constipation since her last visit, but denies any other changes in her baseline health status.  She has not noticed any recent bleeding such as epistaxis, hematemesis, hematochezia, or melena. Her fatigue is at baseline.  She has had some headaches for the past few weeks.  She denies any pica, restless legs, palpitations, chest pain, dyspnea on exertion, lightheadedness, or syncope.  She denies any B symptoms.  No new lumps or bumps.    She has 25% energy and 75% appetite. She endorses that she is maintaining a stable weight.  ASSESSMENT & PLAN:  1.  Normocytic anemia  - Previously seen by Dr. 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  She was discharged from hematology clinic in 2016 as her anemia had been stable on Nu-iron supplement. - She has developed some CKD stage IIIa since January 2023 - Labs sent by PCP (09/16/2021) shows normocytic anemia with Hgb 9.6/MCV 92.2, iron saturation 11%, TIBC 373.  (Ferritin not checked.)  She had normal creatinine 0.94 when checked on 06/23/2021. - No history of blood transfusion.  Received IV Feraheme in 2015 and in August 2022. - Currently taking iron  supplement Nu-iron once daily.   Also takes pantoprazole and aspirin 81 mg daily at home. - Reports episode of dark stool in the summer 2022, and followed with GI.   - EGD (06/03/2021 by Dr. Gala Romney): Normal esophagus, stomach, duodenum - Colonoscopy (04/27/2020 by Dr. Gala Romney): Internal hemorrhoids and diverticulosis - Hemoccult stool x3 negative (December 2022) - Work-up of anemia (10/17/2021) showed Hgb 8.9/MCV 101.0.   Iron saturation 15% but elevated ferritin 329 (high suspicion that elevated ferritin was acute phase reactant, as multiple inflammatory markers were also positive and patient has underlying RA).   Normal folate, copper, methylmalonic acid.  Elevated B12 at 2718 (likely acute phase reactant).  SPEP negative for M spike.  Immunofixation negative for monoclonal protein.  Mildly elevated kappa light chains 41.1/normal lambda 25.6, normal ratio 1.61. BMP (02/21/2022) shows creatinine 1.21/GFR 47. - She received IV Feraheme x2 on 10/21/2021 and 10/28/2021, with subsequent improvement in her hemoglobin. - She continues to have ongoing fatigue - No bright red blood per rectum or melena - Most recent labs (01/08/2023): Hgb 10.7/MCV 99.1, ferritin 399, iron saturation 15% with TIBC 392.  Creatinine 0.93/GFR >60. - Differential diagnosis favors anemia of chronic disease with functional iron deficiency.  She may have some anemia related to her leflunomide, but anemia is not severe enough to warrant decreasing her leflunomide dose. - PLAN: No indication for IV iron supplementation at this time.  Hemoglobin improved after IV iron in December 2022. - Repeat labs and RTC for office visit in 6 months  - Patient instructed to continue oral iron supplementation, but  to take it at least 4 hours after her PPI, along with a glass of orange juice to improve absorption. - Would consider ESA if she has persistently low hemoglobin (Hgb < 10.0) in the setting of CKD stage IIIa - If she has worsening anemia despite  iron repletion, we will consider bone marrow biopsy or holding leflunomide if rheumatology is agreeable.  2.  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 Earlie Server, who thought patient to have reactive leukocytosis. - BCR/ABL by PCR testing was negative in January 2014 - JAK2 with reflex was negative - Inflammatory markers positive (10/17/2021): CRP 2.4, ESR 76, elevated B12 and ferritin - She had COVID-19 infection in November 2022. - She uses steroid inhaler (Trelegy) and intermittent Kenalog cream. - She is a current non-smoker. - She has underlying rheumatoid arthritis.  She is on leflunomide, which is managed by Dr. Gavin Pound. - She denies any B symptoms such as fever, chills, night sweats, unintentional weight loss.  No new lumps or bumps.    - Most recent labs (01/08/2023): WBC 8.9 with normal differential - Differential diagnosis favors reactive leukocytosis in the setting of rheumatoid arthritis and chronic disease - PLAN: No further work-up or intervention at this time.  We will continue surveillance with repeat CBC in 6 months.    3.  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 Negative for appetite change, chills, diaphoresis, fever and unexpected weight change.  HENT:   Negative for lump/mass and nosebleeds.   Eyes:  Negative for eye problems.  Respiratory:  Negative for cough, hemoptysis and shortness of breath.   Cardiovascular:   Negative for chest pain, leg swelling and palpitations.  Gastrointestinal:  Positive for constipation. Negative for abdominal pain, blood in stool, diarrhea, nausea and vomiting.  Genitourinary:  Negative for hematuria.   Skin: Negative.   Neurological:  Positive for headaches. Negative for dizziness and light-headedness.  Hematological:  Does not bruise/bleed easily.  Psychiatric/Behavioral:  Positive for sleep disturbance.      PHYSICAL EXAM:  ECOG PERFORMANCE STATUS: 1 - Symptomatic but completely ambulatory  There were no vitals filed for this visit. There were no vitals filed for this visit. Physical Exam Constitutional:      Appearance: Normal appearance.  HENT:     Head: Normocephalic and atraumatic.     Mouth/Throat:     Mouth: Mucous membranes are moist.  Eyes:     Extraocular Movements: Extraocular movements intact.     Pupils: Pupils are equal, round, and reactive to light.  Cardiovascular:     Rate and Rhythm: Normal rate and regular rhythm.     Pulses: Normal pulses.     Heart sounds: Normal heart sounds.  Pulmonary:     Effort: Pulmonary effort is normal.     Breath sounds: Normal breath sounds. Decreased air movement present.  Abdominal:     General: Bowel sounds are normal.     Palpations: Abdomen is soft.     Tenderness: There is no abdominal tenderness.  Musculoskeletal:        General: No swelling.     Right hand: Deformity present.     Left hand: Deformity present.     Right lower leg: No edema.     Left lower leg: No edema.     Comments: Contractures and nodules of bilateral hands in the setting of rheumatoid arthritis  Lymphadenopathy:     Cervical: No cervical adenopathy.  Skin:    General: Skin is warm and dry.  Neurological:     General: No focal deficit present.     Mental Status: She is alert and oriented to person, place, and time.  Psychiatric:        Mood and Affect: Mood normal.        Behavior: Behavior normal.     PAST  MEDICAL/SURGICAL HISTORY:  Past Medical History:  Diagnosis Date   Anemia    anemia of chronic disease +/- IDA followed by Dr. Earlie Server. received iron infusions in the past   Asthma    Borderline glaucoma    CAD (coronary artery disease)    a. s/p CABG 1996. b. low risk nuc 2011. c. LHC 04/2016 due to drop in EF -> occluded native LAD,  widely patent sequential LIMA-D1-LAD.   Chronic kidney disease    "mild stage of kidney disease"   Chronic systolic CHF (congestive heart failure) (HCC)    Complication of anesthesia    DM2 (diabetes mellitus, type 2) ([place])    not on any medicine for this at this time, 05/2016   Dyspnea    with exertion   GERD (gastroesophageal reflux disease)    Gout    History of blood transfusion    History of hiatal hernia    in 20s   History of pneumonia    March, 2016   HLD (hyperlipidemia)    HTN (hypertension)    Hyperthyroidism 01/21/2016   Inappropriate sinus tachycardia    LBBB (left bundle branch block)    a. Seen in 04/2016   Lymphocytic colitis 04/2020   Macular degeneration    left   Myocardial infarction [facility])    1996   Neuropathy    Neuropathy    NICM (nonischemic cardiomyopathy) ([place])    a. Dx 04/2016 - EF 25-30%, diffuse HK, elevated LVEDP, mild MR, mod LAE.   Normocytic anemia 10/17/2021   NSVT (nonsustained ventricular tachycardia) (HCC)    PAT (paroxysmal atrial tachycardia)    Pneumonia    PONV (postoperative nausea and vomiting)    "after just about every surgery I've had"   Rheumatoid arthritis ([place])    RA- hands   Seasonal allergies    Past Surgical History:  Procedure Laterality Date   ABDOMINAL HYSTERECTOMY     APPENDECTOMY     BACK SURGERY     BIOPSY  04/27/2020   Procedure: BIOPSY;  Surgeon: Daneil Dolin, MD;  [facility];  Service: Endoscopy;;  ascending colon biopsy   CARDIAC CATHETERIZATION N/A 05/01/2016   Procedure: Right/Left Heart Cath and Coronary/Graft Angiography;  Surgeon: Leonie Man, MD;   Location: [facility];  Service: Cardiovascular;  Laterality: N/A;   CHOLECYSTECTOMY     COLONOSCOPY  11/2011   Dr. Magod:ext/int hemorrhoids, diverticulosis sigmoid colon and distal desc colon, mid-desc colon hyperplastic polyps.    COLONOSCOPY WITH PROPOFOL N/A 04/27/2020   Rourk: Diverticulosis, hemorrhoids, normal terminal ileum.  Random colon biopsies significant for chronic lymphocytic colitis. No repeat due to age.  CORONARY ARTERY BYPASS GRAFT  1996   2 vessels   ESOPHAGOGASTRODUODENOSCOPY  11/2011   Dr. Watt Climes: small hiatal hernia, one non-bleeding superficial gastric ulcer, medium-sized diverticulum in area of papilla   ESOPHAGOGASTRODUODENOSCOPY N/A 12/29/2015   Dr. Gala Romney: Chronic inactive gastritis, normal esophagus status post dilation, duodenal diverticula   ESOPHAGOGASTRODUODENOSCOPY (EGD) WITH PROPOFOL N/A 07/24/2019   Dr. Gala Romney: Normal esophagus status post empiric dilation, small hiatal hernia, large D2 diverticulum   ESOPHAGOGASTRODUODENOSCOPY (EGD) WITH PROPOFOL N/A 06/03/2021   Surgeon: Daneil Dolin, MD; normal esophagus, normal stomach, normal duodenum.  Abnormal hypopharyngeal mucosa bilaterally just distal to the base of the tongue (right side greater than left), overlying mucosa appeared irregular and somewhat verrucous in appearance.  Recommended ENT evaluation.   EYE SURGERY Bilateral    cataract removal   FOOT SURGERY     removal bone spur   FRACTURE SURGERY Right    arm   GIVENS CAPSULE STUDY  11/2012   Dr. Watt Climes: minimal gastritis, normal small bowel capsule   HERNIA REPAIR     hiatal hernia   ICD IMPLANT N/A 02/04/2021   Procedure: ICD IMPLANT;  Surgeon: Evans Lance, MD;  Location: [facility];  Service: Cardiovascular;  Laterality: N/A;   MALONEY DILATION N/A 07/24/2019   Procedure: Venia Minks DILATION;  Surgeon: Daneil Dolin, MD;  [facility];  Service: Endoscopy;  Laterality: N/A;   MALONEY DILATION N/A 06/03/2021    Procedure: Venia Minks DILATION;  Surgeon: Daneil Dolin, MD;  [facility];  Service: Endoscopy;  Laterality: N/A;   MAXIMUM ACCESS (MAS)POSTERIOR LUMBAR INTERBODY FUSION (PLIF) 1 LEVEL N/A 08/14/2013   Procedure:  MAXIMUM ACCESS SURGERY(MAS) POSTERIOR LUMBAR INTERBODY FUSION LUMBAR THREE-FOUR ;  Surgeon: Eustace Moore, MD;  Location: [facility];  Service: Neurosurgery;  Laterality: N/A;   MAXIMUM ACCESS SURGERY(MAS) POSTERIOR LUMBAR INTERBODY FUSION LUMBAR THREE-FOUR    PTCA     RIGHT/LEFT HEART CATH AND CORONARY ANGIOGRAPHY N/A 03/26/2020   Procedure: RIGHT/LEFT HEART CATH AND CORONARY ANGIOGRAPHY;  Surgeon: Leonie Man, MD;  Location: [facility];  Service: Cardiovascular;  Laterality: N/A;   SPINAL CORD STIMULATOR BATTERY EXCHANGE N/A 01/19/2021   Procedure: Spinal cord stimulator battery change;  Surgeon: Reece Agar, MD;  Location: [facility];  Service: Neurosurgery;  Laterality: N/A;   SPINAL CORD STIMULATOR INSERTION N/A 06/16/2016   Procedure: LUMBAR SPINAL CORD STIMULATOR INSERTION;  Surgeon: Clydell Hakim, MD;  Location: [place] [facility];  Service: Neurosurgery;  Laterality: N/A;  LUMBAR SPINAL CORD STIMULATOR INSERTION   tens  05/2016   TONSILLECTOMY      SOCIAL HISTORY:  Social History   Socioeconomic History   Marital status: Married    Spouse name: Not on file   Number of children: 3   Years of education: Not on file   Highest education level: Not on file  Occupational History   Not on file  Tobacco Use   Smoking status: Former    Packs/day: 0.75    Years: 15.00    Total pack years: 11.25    Types: Cigarettes    Quit date: 11/20/1994    Years since quitting: 28.1   Smokeless tobacco: Never   Tobacco comments:    Quit in 1996  Vaping Use   Vaping Use: Never used  Substance and Sexual Activity   Alcohol use: Never    Alcohol/week: 0.0 standard drinks of alcohol   Drug use: Never   Sexual activity:  Yes  Other Topics Concern   Not on file   Social History Narrative   2 living children, one deceased age 110   Right handed   One story home   Drinks coffee am   Social Determinants of Health   Financial Resource Strain: Not on file  Food Insecurity: Not on file  Transportation Needs: Not on file  Physical Activity: Not on file  Stress: Not on file  Social Connections: Not on file  Intimate Partner Violence: Not on file    FAMILY HISTORY:  Family History  Problem Relation Age of Onset   Heart attack Father    Heart attack Mother    Bladder Cancer Brother    Cervical cancer Daughter        ?   Colon cancer Neg Hx     CURRENT MEDICATIONS:  Outpatient Encounter Medications as of 01/15/2023  Medication Sig   albuterol (PROVENTIL HFA;VENTOLIN HFA) 108 (90 Base) MCG/ACT inhaler Take 2 puffs 3 times a day and every 4 hours as needed.   allopurinol (ZYLOPRIM) 300 MG tablet Take 300 mg by mouth daily.   aspirin EC 81 MG EC tablet Take 1 tablet (81 mg total) by mouth daily.   atorvastatin (LIPITOR) 80 MG tablet Take 80 mg by mouth daily.   carvedilol (COREG) 6.25 MG tablet TAKE 1 AND 1/2 TABLETS(9.375 MG) BY MOUTH TWICE DAILY   cholecalciferol (VITAMIN D3) 25 MCG (1000 UT) tablet Take 1,000 Units by mouth daily.   Cyanocobalamin 2500 MCG SUBL 1 tablet Sublingual Once a day   etanercept (ENBREL SURECLICK) 50 MG/ML injection INJECT 50 MG Subcutaneous ONCE WEEKLY for 30 days   FARXIGA 10 MG TABS tablet TAKE 1 TABLET(10 MG) BY MOUTH DAILY BEFORE BREAKFAST   ferrous sulfate 325 (65 FE) MG tablet Take 325 mg by mouth daily with breakfast. Every other day   fexofenadine (ALLEGRA) 180 MG tablet Take 180 mg by mouth daily.   furosemide (LASIX) 40 MG tablet Take 40 mg by mouth 2 (two) times daily.   gabapentin (NEURONTIN) 400 MG capsule TAKE 1 CAPSULE(400 MG) BY MOUTH THREE TIMES DAILY   HYDROcodone-acetaminophen (NORCO) 10-325 MG tablet Take 1 tablet by mouth every 6 (six) hours as needed for moderate pain.   icosapent Ethyl  (VASCEPA) 1 g capsule Take 2 capsules (2 g total) by mouth 2 (two) times daily.   lansoprazole (PREVACID SOLUTAB) 30 MG disintegrating tablet Take 30 mg by mouth daily at 12 noon.   leflunomide (ARAVA) 20 MG tablet Take 20 mg by mouth daily.   linaclotide (LINZESS) 72 MCG capsule Take 1 capsule (72 mcg total) by mouth daily before breakfast.   losartan (COZAAR) 100 MG tablet Take 100 mg by mouth daily.   MAGNESIUM-OXIDE 400 (240 Mg) MG tablet TAKE 1 TABLET(400 MG) BY MOUTH DAILY   meclizine (ANTIVERT) 12.5 MG tablet Take 12.5 mg by mouth 3 (three) times daily as needed for dizziness.   montelukast (SINGULAIR) 10 MG tablet Take 10 mg by mouth at bedtime.   Multiple Vitamins-Minerals (OCUVITE ADULT 50+ PO) Take 1 tablet by mouth daily.   nitroGLYCERIN (NITROSTAT) 0.4 MG SL tablet DISSOLVE 1 TABLET UNDER THE  TONGUE EVERY 5 MINUTES AS NEEDED FOR CHEST PAIN. MAX OF 3 TABLETS IN 15 MINUTES. CALL 911 IF PAIN  PERSISTS.   spironolactone (ALDACTONE) 25 MG tablet Take 0.5 tablets (12.5 mg total) by mouth daily.   TRELEGY ELLIPTA 200-62.5-25 MCG/ACT AEPB Inhale 1 puff into the lungs daily.  No facility-administered encounter medications on file as of 01/15/2023.    ALLERGIES:  Allergies  Allergen Reactions   Biaxin [Clarithromycin] Rash and Other (See Comments)    Blisters in mouth    Penicillins Rash and Other (See Comments)    Blisters in mouth Has patient had a PCN reaction causing immediate rash, facial/tongue/throat swelling, SOB or lightheadedness with hypotension: Yesyes Has patient had a PCN reaction causing severe rash involving mucus membranes or skin necrosis: Nono Has patient had a PCN reaction that required hospitalization Nono Has patient had a PCN reaction occurring within the last 10 years: Nono If all of the above answers are "NO", then may proceed   Hydroxychloroquine Other (See Comments)   Sulfamethoxazole     Other Reaction(s): Other (See Comments)   Sulfasalazine Other (See  Comments)   Losartan Potassium     Other reaction(s): elevated creatinine    LABORATORY DATA:  I have reviewed the labs as listed.  CBC    Component Value Date/Time   WBC 8.9 01/08/2023 1120   RBC 3.40 (L) 01/08/2023 1120   HGB 10.7 (L) 01/08/2023 1120   HGB 10.8 (L) 11/29/2022 0931   HGB 12.4 11/01/2015 1134   HCT 33.7 (L) 01/08/2023 1120   HCT 33.2 (L) 11/29/2022 0931   HCT 37.4 11/01/2015 1134   PLT 263 01/08/2023 1120   PLT 229 11/29/2022 0931   MCV 99.1 01/08/2023 1120   MCV 97 11/29/2022 0931   MCV 91.7 11/01/2015 1134   MCH 31.5 01/08/2023 1120   MCHC 31.8 01/08/2023 1120   RDW 14.7 01/08/2023 1120   RDW 13.1 11/29/2022 0931   RDW 15.0 (H) 11/01/2015 1134   LYMPHSABS 2.5 01/08/2023 1120   LYMPHSABS 2.6 11/29/2022 0931   LYMPHSABS 2.9 11/01/2015 1134   MONOABS 0.7 01/08/2023 1120   MONOABS 0.7 11/01/2015 1134   EOSABS 0.3 01/08/2023 1120   EOSABS 0.3 11/29/2022 0931   BASOSABS 0.0 01/08/2023 1120   BASOSABS 0.0 11/29/2022 0931   BASOSABS 0.0 11/01/2015 1134      Latest Ref Rng & Units 01/08/2023   11:20 AM 11/29/2022    9:31 AM 09/11/2022   11:19 AM  CMP  Glucose 70 - 99 mg/dL 114  93  130   BUN 8 - 23 mg/dL 27  16  38   Creatinine 0.44 - 1.00 mg/dL 0.93  0.81  1.09   Sodium 135 - 145 mmol/L 138  140  135   Potassium 3.5 - 5.1 mmol/L 4.2  4.4  3.5   Chloride 98 - 111 mmol/L 101  102  98   CO2 22 - 32 mmol/L '25  24  24   '$ Calcium 8.9 - 10.3 mg/dL 9.0  9.1  8.9   Total Protein 6.5 - 8.1 g/dL 7.6  6.6  7.1   Total Bilirubin 0.3 - 1.2 mg/dL 0.6  0.3  0.5   Alkaline Phos 38 - 126 U/L 102  113  92   AST 15 - 41 U/L '23  20  16   '$ ALT 0 - 44 U/L '21  20  16     '$ DIAGNOSTIC IMAGING:  I have independently reviewed the relevant imaging and discussed with the patient.   WRAP UP:  All questions were answered. The patient knows to call the clinic with any problems, questions or concerns.  Medical decision making: Low  Time spent on visit: I spent 15 minutes  counseling the patient face to face. The total time spent in  the appointment was 22 minutes and more than 50% was on counseling.  Harriett Rush, PA-C  01/15/23 10:06 AM

## 2023-01-14 ENCOUNTER — Other Ambulatory Visit (HOSPITAL_COMMUNITY): Payer: Self-pay | Admitting: Cardiology

## 2023-01-15 ENCOUNTER — Inpatient Hospital Stay: Payer: Medicare Other | Admitting: Physician Assistant

## 2023-01-15 ENCOUNTER — Ambulatory Visit: Payer: Medicare Other | Admitting: Physician Assistant

## 2023-01-15 ENCOUNTER — Other Ambulatory Visit: Payer: Self-pay

## 2023-01-15 VITALS — BP 132/63 | HR 72 | Temp 98.7°F | Resp 18 | Ht 62.5 in | Wt 131.3 lb

## 2023-01-15 DIAGNOSIS — N1831 Chronic kidney disease, stage 3a: Secondary | ICD-10-CM

## 2023-01-15 DIAGNOSIS — D649 Anemia, unspecified: Secondary | ICD-10-CM

## 2023-01-15 DIAGNOSIS — Z87891 Personal history of nicotine dependence: Secondary | ICD-10-CM | POA: Diagnosis not present

## 2023-01-15 DIAGNOSIS — D509 Iron deficiency anemia, unspecified: Secondary | ICD-10-CM | POA: Diagnosis not present

## 2023-01-15 DIAGNOSIS — D631 Anemia in chronic kidney disease: Secondary | ICD-10-CM | POA: Diagnosis not present

## 2023-01-15 DIAGNOSIS — Z7982 Long term (current) use of aspirin: Secondary | ICD-10-CM | POA: Diagnosis not present

## 2023-01-15 DIAGNOSIS — D72828 Other elevated white blood cell count: Secondary | ICD-10-CM | POA: Diagnosis not present

## 2023-01-15 DIAGNOSIS — D72829 Elevated white blood cell count, unspecified: Secondary | ICD-10-CM | POA: Diagnosis not present

## 2023-01-15 DIAGNOSIS — Z79899 Other long term (current) drug therapy: Secondary | ICD-10-CM | POA: Diagnosis not present

## 2023-01-15 NOTE — Patient Instructions (Signed)
[place] at [facility] Discharge Instructions  You were seen today by Tarri Abernethy PA-C for your anemia related to chronic disease and low iron.  Your blood and iron levels are stable at your usual baseline.  You do not need any additional IV iron at this time.  LABS: Return in 6 months for repeat labs  FOLLOW-UP APPOINTMENT: PHONE visit in 6 months, after labs   Thank you for choosing [place] at [facility] to provide your oncology and hematology care.  To afford each patient quality time with our provider, please arrive at least 15 minutes before your scheduled appointment time.   If you have a lab appointment with the [facility] please come in thru the Main Entrance and check in at the main information desk.  You need to re-schedule your appointment should you arrive 10 or more minutes late.  We strive to give you quality time with our providers, and arriving late affects you and other patients whose appointments are after yours.  Also, if you no show three or more times for appointments you may be dismissed from the clinic at the providers discretion.     Again, thank you for choosing [facility].  Our hope is that these requests will decrease the amount of time that you wait before being seen by our physicians.       _____________________________________________________________  Should you have questions after your visit to [facility], please contact our office at [phone] and follow the prompts.  Our office hours are 8:00 a.m. and 4:30 p.m. Monday - Friday.  Please note that voicemails left after 4:00 p.m. may not be returned until the following business day.  We are closed weekends and major holidays.  You do have access to a nurse 24-7, just call the main number to the clinic [phone] and do not press any options, hold on the line and a nurse will answer the phone.    For prescription refill  requests, have your pharmacy contact our office and allow 72 hours.    Due to Covid, you will need to wear a mask upon entering the hospital. If you do not have a mask, a mask will be given to you at the Main Entrance upon arrival. For doctor visits, patients may have 1 support person age 41 or older with them. For treatment visits, patients can not have anyone with them due to social distancing guidelines and our immunocompromised population.

## 2023-01-17 ENCOUNTER — Ambulatory Visit
Admission: RE | Admit: 2023-01-17 | Discharge: 2023-01-17 | Disposition: A | Discharge status: Home / Self Care | Payer: Medicare Other | Source: Ambulatory Visit | Attending: Family Medicine | Admitting: Family Medicine

## 2023-01-17 DIAGNOSIS — M8589 Other specified disorders of bone density and structure, multiple sites: Secondary | ICD-10-CM | POA: Diagnosis not present

## 2023-01-17 DIAGNOSIS — M8580 Other specified disorders of bone density and structure, unspecified site: Secondary | ICD-10-CM

## 2023-01-17 DIAGNOSIS — Z780 Asymptomatic menopausal state: Secondary | ICD-10-CM | POA: Diagnosis not present

## 2023-01-22 DIAGNOSIS — N390 Urinary tract infection, site not specified: Secondary | ICD-10-CM | POA: Diagnosis not present

## 2023-01-22 DIAGNOSIS — D509 Iron deficiency anemia, unspecified: Secondary | ICD-10-CM | POA: Diagnosis not present

## 2023-01-22 DIAGNOSIS — N189 Chronic kidney disease, unspecified: Secondary | ICD-10-CM | POA: Diagnosis not present

## 2023-01-22 DIAGNOSIS — M069 Rheumatoid arthritis, unspecified: Secondary | ICD-10-CM | POA: Diagnosis not present

## 2023-01-22 DIAGNOSIS — I255 Ischemic cardiomyopathy: Secondary | ICD-10-CM | POA: Diagnosis not present

## 2023-01-24 ENCOUNTER — Other Ambulatory Visit (HOSPITAL_COMMUNITY): Payer: Self-pay

## 2023-01-24 MED ORDER — ICOSAPENT ETHYL 1 G PO CAPS: 1.0000 g | ORAL_CAPSULE | Freq: Two times a day (BID) | ORAL | 10 refills | Status: DC

## 2023-01-25 DIAGNOSIS — I5022 Chronic systolic (congestive) heart failure: Secondary | ICD-10-CM | POA: Diagnosis not present

## 2023-01-25 DIAGNOSIS — E785 Hyperlipidemia, unspecified: Secondary | ICD-10-CM | POA: Diagnosis not present

## 2023-01-25 DIAGNOSIS — I130 Hypertensive heart and chronic kidney disease with heart failure and stage 1 through stage 4 chronic kidney disease, or unspecified chronic kidney disease: Secondary | ICD-10-CM | POA: Diagnosis not present

## 2023-01-25 DIAGNOSIS — D84821 Immunodeficiency due to drugs: Secondary | ICD-10-CM | POA: Diagnosis not present

## 2023-01-25 DIAGNOSIS — M069 Rheumatoid arthritis, unspecified: Secondary | ICD-10-CM | POA: Diagnosis not present

## 2023-01-25 DIAGNOSIS — E1122 Type 2 diabetes mellitus with diabetic chronic kidney disease: Secondary | ICD-10-CM | POA: Diagnosis not present

## 2023-01-25 DIAGNOSIS — U071 COVID-19: Secondary | ICD-10-CM | POA: Diagnosis not present

## 2023-01-25 DIAGNOSIS — N183 Chronic kidney disease, stage 3 unspecified: Secondary | ICD-10-CM | POA: Diagnosis not present

## 2023-01-25 DIAGNOSIS — J449 Chronic obstructive pulmonary disease, unspecified: Secondary | ICD-10-CM | POA: Diagnosis not present

## 2023-01-28 ENCOUNTER — Encounter (HOSPITAL_COMMUNITY): Payer: Self-pay | Admitting: Emergency Medicine

## 2023-01-28 ENCOUNTER — Other Ambulatory Visit: Payer: Self-pay

## 2023-01-28 ENCOUNTER — Inpatient Hospital Stay (HOSPITAL_COMMUNITY)
Admission: EM | Admit: 2023-01-28 | Discharge: 2023-02-03 | DRG: 871 | Disposition: A | Discharge status: Home / Self Care | Payer: Medicare Other | Attending: Internal Medicine | Admitting: Internal Medicine

## 2023-01-28 ENCOUNTER — Emergency Department (HOSPITAL_COMMUNITY): Payer: Medicare Other

## 2023-01-28 DIAGNOSIS — E1122 Type 2 diabetes mellitus with diabetic chronic kidney disease: Secondary | ICD-10-CM | POA: Diagnosis not present

## 2023-01-28 DIAGNOSIS — Z87891 Personal history of nicotine dependence: Secondary | ICD-10-CM

## 2023-01-28 DIAGNOSIS — I5023 Acute on chronic systolic (congestive) heart failure: Secondary | ICD-10-CM | POA: Diagnosis not present

## 2023-01-28 DIAGNOSIS — J9601 Acute respiratory failure with hypoxia: Secondary | ICD-10-CM | POA: Diagnosis not present

## 2023-01-28 DIAGNOSIS — I25810 Atherosclerosis of coronary artery bypass graft(s) without angina pectoris: Secondary | ICD-10-CM | POA: Diagnosis not present

## 2023-01-28 DIAGNOSIS — I2489 Other forms of acute ischemic heart disease: Secondary | ICD-10-CM | POA: Diagnosis not present

## 2023-01-28 DIAGNOSIS — B965 Pseudomonas (aeruginosa) (mallei) (pseudomallei) as the cause of diseases classified elsewhere: Secondary | ICD-10-CM | POA: Diagnosis not present

## 2023-01-28 DIAGNOSIS — J45909 Unspecified asthma, uncomplicated: Secondary | ICD-10-CM | POA: Diagnosis present

## 2023-01-28 DIAGNOSIS — I2510 Atherosclerotic heart disease of native coronary artery without angina pectoris: Secondary | ICD-10-CM | POA: Diagnosis present

## 2023-01-28 DIAGNOSIS — R0902 Hypoxemia: Secondary | ICD-10-CM | POA: Diagnosis not present

## 2023-01-28 DIAGNOSIS — Z95810 Presence of automatic (implantable) cardiac defibrillator: Secondary | ICD-10-CM

## 2023-01-28 DIAGNOSIS — I509 Heart failure, unspecified: Secondary | ICD-10-CM | POA: Diagnosis not present

## 2023-01-28 DIAGNOSIS — R6520 Severe sepsis without septic shock: Secondary | ICD-10-CM | POA: Diagnosis not present

## 2023-01-28 DIAGNOSIS — I110 Hypertensive heart disease with heart failure: Secondary | ICD-10-CM | POA: Diagnosis not present

## 2023-01-28 DIAGNOSIS — I5022 Chronic systolic (congestive) heart failure: Secondary | ICD-10-CM | POA: Diagnosis not present

## 2023-01-28 DIAGNOSIS — R7881 Bacteremia: Secondary | ICD-10-CM | POA: Diagnosis not present

## 2023-01-28 DIAGNOSIS — Z8616 Personal history of COVID-19: Secondary | ICD-10-CM | POA: Diagnosis not present

## 2023-01-28 DIAGNOSIS — A419 Sepsis, unspecified organism: Secondary | ICD-10-CM | POA: Diagnosis not present

## 2023-01-28 DIAGNOSIS — Z981 Arthrodesis status: Secondary | ICD-10-CM

## 2023-01-28 DIAGNOSIS — I130 Hypertensive heart and chronic kidney disease with heart failure and stage 1 through stage 4 chronic kidney disease, or unspecified chronic kidney disease: Secondary | ICD-10-CM | POA: Diagnosis present

## 2023-01-28 DIAGNOSIS — Z7951 Long term (current) use of inhaled steroids: Secondary | ICD-10-CM

## 2023-01-28 DIAGNOSIS — I447 Left bundle-branch block, unspecified: Secondary | ICD-10-CM | POA: Diagnosis present

## 2023-01-28 DIAGNOSIS — Z8249 Family history of ischemic heart disease and other diseases of the circulatory system: Secondary | ICD-10-CM

## 2023-01-28 DIAGNOSIS — M069 Rheumatoid arthritis, unspecified: Secondary | ICD-10-CM | POA: Diagnosis not present

## 2023-01-28 DIAGNOSIS — K219 Gastro-esophageal reflux disease without esophagitis: Secondary | ICD-10-CM | POA: Diagnosis present

## 2023-01-28 DIAGNOSIS — Z9682 Presence of neurostimulator: Secondary | ICD-10-CM

## 2023-01-28 DIAGNOSIS — I255 Ischemic cardiomyopathy: Secondary | ICD-10-CM | POA: Diagnosis not present

## 2023-01-28 DIAGNOSIS — M549 Dorsalgia, unspecified: Secondary | ICD-10-CM | POA: Diagnosis present

## 2023-01-28 DIAGNOSIS — J1282 Pneumonia due to coronavirus disease 2019: Secondary | ICD-10-CM | POA: Diagnosis not present

## 2023-01-28 DIAGNOSIS — I10 Essential (primary) hypertension: Secondary | ICD-10-CM | POA: Diagnosis not present

## 2023-01-28 DIAGNOSIS — R0789 Other chest pain: Secondary | ICD-10-CM | POA: Diagnosis not present

## 2023-01-28 DIAGNOSIS — J181 Lobar pneumonia, unspecified organism: Secondary | ICD-10-CM | POA: Diagnosis present

## 2023-01-28 DIAGNOSIS — M109 Gout, unspecified: Secondary | ICD-10-CM | POA: Diagnosis present

## 2023-01-28 DIAGNOSIS — R6521 Severe sepsis with septic shock: Secondary | ICD-10-CM | POA: Diagnosis not present

## 2023-01-28 DIAGNOSIS — Z951 Presence of aortocoronary bypass graft: Secondary | ICD-10-CM

## 2023-01-28 DIAGNOSIS — I252 Old myocardial infarction: Secondary | ICD-10-CM

## 2023-01-28 DIAGNOSIS — R000 Tachycardia, unspecified: Secondary | ICD-10-CM | POA: Diagnosis not present

## 2023-01-28 DIAGNOSIS — Z7982 Long term (current) use of aspirin: Secondary | ICD-10-CM

## 2023-01-28 DIAGNOSIS — R0689 Other abnormalities of breathing: Secondary | ICD-10-CM | POA: Diagnosis not present

## 2023-01-28 DIAGNOSIS — Z881 Allergy status to other antibiotic agents status: Secondary | ICD-10-CM

## 2023-01-28 DIAGNOSIS — I340 Nonrheumatic mitral (valve) insufficiency: Secondary | ICD-10-CM | POA: Diagnosis not present

## 2023-01-28 DIAGNOSIS — A4152 Sepsis due to Pseudomonas: Principal | ICD-10-CM | POA: Diagnosis present

## 2023-01-28 DIAGNOSIS — N1831 Chronic kidney disease, stage 3a: Secondary | ICD-10-CM | POA: Diagnosis present

## 2023-01-28 DIAGNOSIS — Z880 Allergy status to penicillin: Secondary | ICD-10-CM

## 2023-01-28 DIAGNOSIS — I428 Other cardiomyopathies: Secondary | ICD-10-CM | POA: Diagnosis present

## 2023-01-28 DIAGNOSIS — E1140 Type 2 diabetes mellitus with diabetic neuropathy, unspecified: Secondary | ICD-10-CM | POA: Diagnosis not present

## 2023-01-28 DIAGNOSIS — Z79899 Other long term (current) drug therapy: Secondary | ICD-10-CM

## 2023-01-28 DIAGNOSIS — Z8052 Family history of malignant neoplasm of bladder: Secondary | ICD-10-CM

## 2023-01-28 DIAGNOSIS — J811 Chronic pulmonary edema: Secondary | ICD-10-CM | POA: Diagnosis not present

## 2023-01-28 DIAGNOSIS — J449 Chronic obstructive pulmonary disease, unspecified: Secondary | ICD-10-CM | POA: Diagnosis not present

## 2023-01-28 DIAGNOSIS — J189 Pneumonia, unspecified organism: Secondary | ICD-10-CM | POA: Diagnosis not present

## 2023-01-28 DIAGNOSIS — U071 COVID-19: Secondary | ICD-10-CM | POA: Diagnosis not present

## 2023-01-28 DIAGNOSIS — Z8049 Family history of malignant neoplasm of other genital organs: Secondary | ICD-10-CM

## 2023-01-28 DIAGNOSIS — R0602 Shortness of breath: Secondary | ICD-10-CM | POA: Diagnosis not present

## 2023-01-28 DIAGNOSIS — Z882 Allergy status to sulfonamides status: Secondary | ICD-10-CM

## 2023-01-28 DIAGNOSIS — E782 Mixed hyperlipidemia: Secondary | ICD-10-CM | POA: Diagnosis present

## 2023-01-28 DIAGNOSIS — Z888 Allergy status to other drugs, medicaments and biological substances status: Secondary | ICD-10-CM

## 2023-01-28 DIAGNOSIS — J90 Pleural effusion, not elsewhere classified: Secondary | ICD-10-CM | POA: Diagnosis not present

## 2023-01-28 DIAGNOSIS — Z7984 Long term (current) use of oral hypoglycemic drugs: Secondary | ICD-10-CM

## 2023-01-28 LAB — CBC WITH DIFFERENTIAL/PLATELET
Abs Immature Granulocytes: 0.07 10*3/uL (ref 0.00–0.07)
Basophils Absolute: 0.1 10*3/uL (ref 0.0–0.1)
Basophils Relative: 0 %
Eosinophils Absolute: 0.2 10*3/uL (ref 0.0–0.5)
Eosinophils Relative: 1 %
HCT: 36.9 % (ref 36.0–46.0)
Hemoglobin: 11.6 g/dL — ABNORMAL LOW (ref 12.0–15.0)
Immature Granulocytes: 1 %
Lymphocytes Relative: 19 %
Lymphs Abs: 2.9 10*3/uL (ref 0.7–4.0)
MCH: 31.9 pg (ref 26.0–34.0)
MCHC: 31.4 g/dL (ref 30.0–36.0)
MCV: 101.4 fL — ABNORMAL HIGH (ref 80.0–100.0)
Monocytes Absolute: 0.5 10*3/uL (ref 0.1–1.0)
Monocytes Relative: 3 %
Neutro Abs: 11.4 10*3/uL — ABNORMAL HIGH (ref 1.7–7.7)
Neutrophils Relative %: 76 %
Platelets: 214 10*3/uL (ref 150–400)
RBC: 3.64 MIL/uL — ABNORMAL LOW (ref 3.87–5.11)
RDW: 15.6 % — ABNORMAL HIGH (ref 11.5–15.5)
WBC: 15.1 10*3/uL — ABNORMAL HIGH (ref 4.0–10.5)
nRBC: 0 % (ref 0.0–0.2)

## 2023-01-28 LAB — COMPREHENSIVE METABOLIC PANEL WITH GFR
ALT: 26 U/L (ref 0–44)
AST: 28 U/L (ref 15–41)
Albumin: 3.6 g/dL (ref 3.5–5.0)
Alkaline Phosphatase: 108 U/L (ref 38–126)
Anion gap: 13 (ref 5–15)
BUN: 21 mg/dL (ref 8–23)
CO2: 18 mmol/L — ABNORMAL LOW (ref 22–32)
Calcium: 8.8 mg/dL — ABNORMAL LOW (ref 8.9–10.3)
Chloride: 105 mmol/L (ref 98–111)
Creatinine, Ser: 0.95 mg/dL (ref 0.44–1.00)
GFR, Estimated: >60 mL/min
Glucose, Bld: 277 mg/dL — ABNORMAL HIGH (ref 70–99)
Potassium: 5 mmol/L (ref 3.5–5.1)
Sodium: 136 mmol/L (ref 135–145)
Total Bilirubin: 0.9 mg/dL (ref 0.3–1.2)
Total Protein: 7.3 g/dL (ref 6.5–8.1)

## 2023-01-28 LAB — GLUCOSE, CAPILLARY
Glucose-Capillary: 133 mg/dL — ABNORMAL HIGH (ref 70–99)
Glucose-Capillary: 126 mg/dL — ABNORMAL HIGH (ref 70–99)

## 2023-01-28 LAB — BLOOD GAS, ARTERIAL
Acid-base deficit: 5.5 mmol/L — ABNORMAL HIGH (ref 0.0–2.0)
Bicarbonate: 18 mmol/L — ABNORMAL LOW (ref 20.0–28.0)
Drawn by: 442
O2 Saturation: 99.7 %
Patient temperature: 37
pCO2 arterial: 29 mmHg — ABNORMAL LOW (ref 32–48)
pH, Arterial: 7.4 (ref 7.35–7.45)
pO2, Arterial: 210 mmHg — ABNORMAL HIGH (ref 83–108)

## 2023-01-28 LAB — URINALYSIS, ROUTINE W REFLEX MICROSCOPIC
Bilirubin Urine: NEGATIVE
Glucose, UA: >=500 mg/dL — AB
Hgb urine dipstick: NEGATIVE
Ketones, ur: NEGATIVE mg/dL
Leukocytes,Ua: NEGATIVE
Nitrite: POSITIVE — AB
Protein, ur: NEGATIVE mg/dL
Specific Gravity, Urine: 1.02 (ref 1.005–1.030)
pH: 5.5 (ref 5.0–8.0)

## 2023-01-28 LAB — MRSA NEXT GEN BY PCR, NASAL: MRSA by PCR Next Gen: NOT DETECTED

## 2023-01-28 LAB — BRAIN NATRIURETIC PEPTIDE: B Natriuretic Peptide: 503 pg/mL — ABNORMAL HIGH (ref 0.0–100.0)

## 2023-01-28 LAB — APTT: aPTT: 23 s — ABNORMAL LOW (ref 24–36)

## 2023-01-28 LAB — RESP PANEL BY RT-PCR (RSV, FLU A&B, COVID)  RVPGX2
Influenza A by PCR: NEGATIVE
Influenza B by PCR: NEGATIVE
Resp Syncytial Virus by PCR: NEGATIVE
SARS Coronavirus 2 by RT PCR: POSITIVE — AB

## 2023-01-28 LAB — URINALYSIS, MICROSCOPIC (REFLEX)

## 2023-01-28 LAB — TROPONIN I (HIGH SENSITIVITY): Troponin I (High Sensitivity): 169 ng/L

## 2023-01-28 LAB — PROCALCITONIN: Procalcitonin: 7.52 ng/mL

## 2023-01-28 LAB — LACTIC ACID, PLASMA
Lactic Acid, Venous: 5.2 mmol/L (ref 0.5–1.9)
Lactic Acid, Venous: 3.5 mmol/L (ref 0.5–1.9)

## 2023-01-28 LAB — PROTIME-INR
INR: 1.1 (ref 0.8–1.2)
Prothrombin Time: 14 s (ref 11.4–15.2)

## 2023-01-28 MED ORDER — ONDANSETRON HCL 4 MG/2ML IJ SOLN
4.0000 mg | Freq: Four times a day (QID) | INTRAMUSCULAR | Status: DC | PRN
  Administered 2023-01-28: 4 mg via INTRAVENOUS
  Filled 2023-01-28: qty 2

## 2023-01-28 MED ORDER — HYDROCODONE-ACETAMINOPHEN 10-325 MG PO TABS
1.0000 | ORAL_TABLET | Freq: Four times a day (QID) | ORAL | Status: DC | PRN
  Administered 2023-01-28: 1 via ORAL
  Filled 2023-01-28: qty 1

## 2023-01-28 MED ORDER — VANCOMYCIN HCL 750 MG/150ML IV SOLN
750.0000 mg | INTRAVENOUS | Status: DC
  Filled 2023-01-28: qty 150

## 2023-01-28 MED ORDER — OXYCODONE HCL 5 MG PO TABS
10.0000 mg | ORAL_TABLET | ORAL | Status: DC | PRN
  Administered 2023-01-28 – 2023-02-03 (×27): 10 mg via ORAL
  Filled 2023-01-28 (×28): qty 2

## 2023-01-28 MED ORDER — CHLORHEXIDINE GLUCONATE CLOTH 2 % EX PADS
6.0000 | MEDICATED_PAD | Freq: Every day | CUTANEOUS | Status: DC
  Administered 2023-01-28 – 2023-02-02 (×6): 6 via TOPICAL

## 2023-01-28 MED ORDER — ORAL CARE MOUTH RINSE: 15.0000 mL | OROMUCOSAL | Status: DC | PRN

## 2023-01-28 MED ORDER — ORAL CARE MOUTH RINSE
15.0000 mL | OROMUCOSAL | Status: DC
  Administered 2023-01-28 – 2023-02-03 (×23): 15 mL via OROMUCOSAL

## 2023-01-28 MED ORDER — LEFLUNOMIDE 20 MG PO TABS
20.0000 mg | ORAL_TABLET | Freq: Every day | ORAL | Status: DC
  Administered 2023-01-28 – 2023-01-29 (×2): 20 mg via ORAL
  Filled 2023-01-28 (×4): qty 1

## 2023-01-28 MED ORDER — ETOMIDATE 2 MG/ML IV SOLN
INTRAVENOUS | Status: AC
  Filled 2023-01-28: qty 10

## 2023-01-28 MED ORDER — SODIUM CHLORIDE 0.9 % IV BOLUS
1000.0000 mL | Freq: Once | INTRAVENOUS | Status: AC
  Administered 2023-01-28: 1000 mL via INTRAVENOUS

## 2023-01-28 MED ORDER — ENOXAPARIN SODIUM 40 MG/0.4ML IJ SOSY
40.0000 mg | PREFILLED_SYRINGE | INTRAMUSCULAR | Status: DC
  Administered 2023-01-28 – 2023-02-02 (×6): 40 mg via SUBCUTANEOUS
  Filled 2023-01-28 (×7): qty 0.4

## 2023-01-28 MED ORDER — GABAPENTIN 300 MG PO CAPS
300.0000 mg | ORAL_CAPSULE | Freq: Three times a day (TID) | ORAL | Status: DC
  Administered 2023-01-28 – 2023-02-03 (×18): 300 mg via ORAL
  Filled 2023-01-28 (×18): qty 1

## 2023-01-28 MED ORDER — SUCCINYLCHOLINE CHLORIDE 200 MG/10ML IV SOSY
PREFILLED_SYRINGE | INTRAVENOUS | Status: AC
  Filled 2023-01-28: qty 10

## 2023-01-28 MED ORDER — LACTATED RINGERS IV SOLN: INTRAVENOUS | Status: DC

## 2023-01-28 MED ORDER — LEVOFLOXACIN IN D5W 750 MG/150ML IV SOLN
750.0000 mg | Freq: Once | INTRAVENOUS | Status: AC
  Administered 2023-01-28: 750 mg via INTRAVENOUS
  Filled 2023-01-28: qty 150

## 2023-01-28 MED ORDER — ACETAMINOPHEN 650 MG RE SUPP: 650.0000 mg | Freq: Four times a day (QID) | RECTAL | Status: DC | PRN

## 2023-01-28 MED ORDER — ONDANSETRON HCL 4 MG PO TABS: 4.0000 mg | ORAL_TABLET | Freq: Four times a day (QID) | ORAL | Status: DC | PRN

## 2023-01-28 MED ORDER — OMEGA-3-ACID ETHYL ESTERS 1 G PO CAPS
1.0000 g | ORAL_CAPSULE | Freq: Two times a day (BID) | ORAL | Status: DC
  Administered 2023-01-28 – 2023-02-03 (×12): 1 g via ORAL
  Filled 2023-01-28 (×12): qty 1

## 2023-01-28 MED ORDER — SODIUM CHLORIDE 0.9 % IV SOLN
2.0000 g | Freq: Three times a day (TID) | INTRAVENOUS | Status: DC
  Administered 2023-01-28 – 2023-01-29 (×3): 2 g via INTRAVENOUS
  Filled 2023-01-28 (×7): qty 10

## 2023-01-28 MED ORDER — VANCOMYCIN HCL 1250 MG/250ML IV SOLN
1250.0000 mg | Freq: Once | INTRAVENOUS | Status: AC
  Administered 2023-01-28: 1250 mg via INTRAVENOUS
  Filled 2023-01-28: qty 250

## 2023-01-28 MED ORDER — ONDANSETRON HCL 4 MG/2ML IJ SOLN
4.0000 mg | Freq: Once | INTRAMUSCULAR | Status: AC
  Administered 2023-01-28: 4 mg via INTRAVENOUS
  Filled 2023-01-28: qty 2

## 2023-01-28 MED ORDER — ASPIRIN 81 MG PO TBEC
81.0000 mg | DELAYED_RELEASE_TABLET | Freq: Every day | ORAL | Status: DC
  Administered 2023-01-28 – 2023-02-03 (×7): 81 mg via ORAL
  Filled 2023-01-28 (×7): qty 1

## 2023-01-28 MED ORDER — ATORVASTATIN CALCIUM 40 MG PO TABS
80.0000 mg | ORAL_TABLET | Freq: Every day | ORAL | Status: DC
  Administered 2023-01-29 – 2023-02-03 (×6): 80 mg via ORAL
  Filled 2023-01-28 (×6): qty 2

## 2023-01-28 MED ORDER — ACETAMINOPHEN 325 MG PO TABS
650.0000 mg | ORAL_TABLET | Freq: Four times a day (QID) | ORAL | Status: DC | PRN
  Administered 2023-01-28 – 2023-01-30 (×3): 650 mg via ORAL
  Filled 2023-01-28 (×4): qty 2

## 2023-01-28 NOTE — Progress Notes (Signed)
Patient arrived to unit A&Ox4. No distress noted. Patient oriented to staff and unit and equipment. Safety precautions in place. Per ED nurse, the patient's spouse took her personal belongings home. No personal belongings at the bedside. Call light & phone within reach. Safety and education provided. Plan of care reviewed with patient and family and they agree. IVF discontinued at this time per provider's orders. Also, patient c/o of pain and it not within time for next PRN pain medication. Provider made aware and spoke to patient and spouse. Per provider, spouse will bring in patients back stimulator. Pain meds switched to oxycodone. Will continue to monitor and endorse.

## 2023-01-28 NOTE — Progress Notes (Signed)
Report received 

## 2023-01-28 NOTE — Progress Notes (Signed)
Patient is now on 3L [place]. No distress noted. Rhonchus lung sounds in all quadrants. Spoke to provider to clarify the need to repeat LA since the last one was 3.5. Per provider no need to repeat LA since its trending down. Safety maintained. Bed in the lowest position and functioning without any issues. Personal items kept within reach. Endorse to [employer].

## 2023-01-28 NOTE — Sepsis Progress Note (Signed)
Elink following code sepsis °

## 2023-01-28 NOTE — Progress Notes (Signed)
Pharmacy Antibiotic Note  Katherine Larson is a 77 y.o. female admitted on 01/28/2023 with bacteremia and sepsis.  Pharmacy has been consulted for vancomycin dosing.  Plan: Vancomycin 1250 mg IV x 1 dose. Vancomycin 750 mg IV every 24 hours. Aztreonam 2000 mg IV every 8 hours. Monitor labs, c/s, and vanco level as indicated.     Temp (24hrs), Avg:98.2 F (36.8 C), Min:98.2 F (36.8 C), Max:98.2 F (36.8 C)  Recent Labs  Lab 01/28/23 0658 01/28/23 0700  WBC 15.1*  --   CREATININE  --  0.95  LATICACIDVEN 5.2*  --     Estimated Creatinine Clearance: 41.4 mL/min (by C-G formula based on SCr of 0.95 mg/dL).    Allergies  Allergen Reactions   Biaxin [Clarithromycin] Rash and Other (See Comments)    Blisters in mouth    Penicillins Rash and Other (See Comments)    Blisters in mouth Has patient had a PCN reaction causing immediate rash, facial/tongue/throat swelling, SOB or lightheadedness with hypotension: Yesyes Has patient had a PCN reaction causing severe rash involving mucus membranes or skin necrosis: Nono Has patient had a PCN reaction that required hospitalization Nono Has patient had a PCN reaction occurring within the last 10 years: Nono If all of the above answers are "NO", then may proceed   Hydroxychloroquine Other (See Comments)   Sulfamethoxazole     Other Reaction(s): Other (See Comments)   Sulfasalazine Other (See Comments)   Losartan Potassium     Other reaction(s): elevated creatinine    Antimicrobials this admission: Vanco 3/10 >> Aztreonam 3/10 >> Levaquin 3/10    Microbiology results: 3/10 BCx: pending  3/10 MRSA PCR: pending  Thank you for allowing pharmacy to be a part of this patient's care.  Margot Ables, PharmD Clinical Pharmacist 01/28/2023 9:08 AM

## 2023-01-28 NOTE — Hospital Course (Addendum)
77 year old female with a history of coronary artery disease, hypertension, hyperlipidemia, HFrEF (EF 30-35%) status post ICD, GERD, lymphocytic colitis, and left bundle branch block presenting with 1 day history of shortness of breath.  The patient was diagnosed with COVID on 01/09/2023 and given a 5-day course of Paxlovid.  She finished the course.  She was doing fine until the past 24 hours when she developed worsening shortness of breath.  She denies any fevers, chills, headache, hemoptysis, diarrhea, abdominal pain.  She had an episode of nausea and vomiting.  There is no hematemesis.  Upon EMS arrival, the patient was noted to be hypoxic with saturation in the 80s on room air.  She had difficulty tolerating CPAP and was placed on nonrebreather.  Upon arrival in the emergency department, the patient was placed on BiPAP. In the ED, the patient was afebrile and hemodynamically stable with oxygen saturation 100% on BiPAP.  WBC 15.1, hemoglobin 11.6, platelets 214,000.  Sodium 136, potassium 5.0, bicarbonate 18, serum creatinine 0.95.  LFTs were unremarkable.  Lactic acid was 5.2. troponin 169.  BNP 503.  Chest x-ray showed left lower lobe, right lower lobe, and right middle lobe opacities.  The patient was started on levofloxacin.  She was given 2 L of IV fluid and started on maintenance fluids.

## 2023-01-28 NOTE — H&P (Signed)
History and Physical    Patient: Katherine Larson [id number] DOB: [birth year] DOA: 01/28/2023 DOS: the patient was seen and examined on 01/28/2023 PCP: Harlan Stains, MD  Patient coming from: Home  Chief Complaint:  Chief Complaint  Patient presents with   Shortness of Breath   HPI: Katherine Larson is a 77 year old female with a history of coronary artery disease, hypertension, hyperlipidemia, HFrEF (EF 30-35%) status post ICD, GERD, lymphocytic colitis, and left bundle branch block presenting with 1 day history of shortness of breath.  The patient was diagnosed with COVID on 01/09/2023 and given a 5-day course of Paxlovid.  She finished the course.  She was doing fine until the past 24 hours when she developed worsening shortness of breath.  She denies any fevers, chills, headache, hemoptysis, diarrhea, abdominal pain.  She had an episode of nausea and vomiting.  There is no hematemesis.  Upon EMS arrival, the patient was noted to be hypoxic with saturation in the 80s on room air.  She had difficulty tolerating CPAP and was placed on nonrebreather.  Upon arrival in the emergency department, the patient was placed on BiPAP. In the ED, the patient was afebrile and hemodynamically stable with oxygen saturation 100% on BiPAP.  WBC 15.1, hemoglobin 11.6, platelets 214,000.  Sodium 136, potassium 5.0, bicarbonate 18, serum creatinine 0.95.  LFTs were unremarkable.  Lactic acid was 5.2. troponin 169.  BNP 503.  Chest x-ray showed left lower lobe, right lower lobe, and right middle lobe opacities.  The patient was started on levofloxacin.  She was given 2 L of IV fluid and started on maintenance fluids.  Review of Systems: As mentioned in the history of present illness. All other systems reviewed and are negative. Past Medical History:  Diagnosis Date   Anemia    anemia of chronic disease +/- IDA followed by Dr. Earlie Server. received iron infusions in the past   Asthma    Borderline glaucoma    CAD  (coronary artery disease)    a. s/p CABG 1996. b. low risk nuc 2011. c. LHC 04/2016 due to drop in EF -> occluded native LAD,  widely patent sequential LIMA-D1-LAD.   Chronic kidney disease    "mild stage of kidney disease"   Chronic systolic CHF (congestive heart failure) (HCC)    Complication of anesthesia    DM2 (diabetes mellitus, type 2) ([place])    not on any medicine for this at this time, 05/2016   Dyspnea    with exertion   GERD (gastroesophageal reflux disease)    Gout    History of blood transfusion    History of hiatal hernia    in 20s   History of pneumonia    March, 2016   HLD (hyperlipidemia)    HTN (hypertension)    Hyperthyroidism 01/21/2016   Inappropriate sinus tachycardia    LBBB (left bundle branch block)    a. Seen in 04/2016   Lymphocytic colitis 04/2020   Macular degeneration    left   Myocardial infarction [facility])    1996   Neuropathy    Neuropathy    NICM (nonischemic cardiomyopathy) ([place])    a. Dx 04/2016 - EF 25-30%, diffuse HK, elevated LVEDP, mild MR, mod LAE.   Normocytic anemia 10/17/2021   NSVT (nonsustained ventricular tachycardia) (HCC)    PAT (paroxysmal atrial tachycardia)    Pneumonia    PONV (postoperative nausea and vomiting)    "after just about every surgery I've had"   Rheumatoid arthritis (  [place])    RA- hands   Seasonal allergies    Past Surgical History:  Procedure Laterality Date   ABDOMINAL HYSTERECTOMY     APPENDECTOMY     BACK SURGERY     BIOPSY  04/27/2020   Procedure: BIOPSY;  Surgeon: Daneil Dolin, MD;  [facility];  Service: Endoscopy;;  ascending colon biopsy   CARDIAC CATHETERIZATION N/A 05/01/2016   Procedure: Right/Left Heart Cath and Coronary/Graft Angiography;  Surgeon: Leonie Man, MD;  Location: [facility];  Service: Cardiovascular;  Laterality: N/A;   CHOLECYSTECTOMY     COLONOSCOPY  11/2011   Dr. Magod:ext/int hemorrhoids, diverticulosis sigmoid colon and distal desc colon, mid-desc colon  hyperplastic polyps.    COLONOSCOPY WITH PROPOFOL N/A 04/27/2020   Rourk: Diverticulosis, hemorrhoids, normal terminal ileum.  Random colon biopsies significant for chronic lymphocytic colitis. No repeat due to age.   CORONARY ARTERY BYPASS GRAFT  1996   2 vessels   ESOPHAGOGASTRODUODENOSCOPY  11/2011   Dr. Watt Climes: small hiatal hernia, one non-bleeding superficial gastric ulcer, medium-sized diverticulum in area of papilla   ESOPHAGOGASTRODUODENOSCOPY N/A 12/29/2015   Dr. Gala Romney: Chronic inactive gastritis, normal esophagus status post dilation, duodenal diverticula   ESOPHAGOGASTRODUODENOSCOPY (EGD) WITH PROPOFOL N/A 07/24/2019   Dr. Gala Romney: Normal esophagus status post empiric dilation, small hiatal hernia, large D2 diverticulum   ESOPHAGOGASTRODUODENOSCOPY (EGD) WITH PROPOFOL N/A 06/03/2021   Surgeon: Daneil Dolin, MD; normal esophagus, normal stomach, normal duodenum.  Abnormal hypopharyngeal mucosa bilaterally just distal to the base of the tongue (right side greater than left), overlying mucosa appeared irregular and somewhat verrucous in appearance.  Recommended ENT evaluation.   EYE SURGERY Bilateral    cataract removal   FOOT SURGERY     removal bone spur   FRACTURE SURGERY Right    arm   GIVENS CAPSULE STUDY  11/2012   Dr. Watt Climes: minimal gastritis, normal small bowel capsule   HERNIA REPAIR     hiatal hernia   ICD IMPLANT N/A 02/04/2021   Procedure: ICD IMPLANT;  Surgeon: Evans Lance, MD;  Location: [facility];  Service: Cardiovascular;  Laterality: N/A;   MALONEY DILATION N/A 07/24/2019   Procedure: Venia Minks DILATION;  Surgeon: Daneil Dolin, MD;  [facility];  Service: Endoscopy;  Laterality: N/A;   MALONEY DILATION N/A 06/03/2021   Procedure: Venia Minks DILATION;  Surgeon: Daneil Dolin, MD;  [facility];  Service: Endoscopy;  Laterality: N/A;   MAXIMUM ACCESS (MAS)POSTERIOR LUMBAR INTERBODY FUSION (PLIF) 1 LEVEL N/A 08/14/2013    Procedure:  MAXIMUM ACCESS SURGERY(MAS) POSTERIOR LUMBAR INTERBODY FUSION LUMBAR THREE-FOUR ;  Surgeon: Eustace Moore, MD;  Location: [place] [facility];  Service: Neurosurgery;  Laterality: N/A;   MAXIMUM ACCESS SURGERY(MAS) POSTERIOR LUMBAR INTERBODY FUSION LUMBAR THREE-FOUR    PTCA     RIGHT/LEFT HEART CATH AND CORONARY ANGIOGRAPHY N/A 03/26/2020   Procedure: RIGHT/LEFT HEART CATH AND CORONARY ANGIOGRAPHY;  Surgeon: Leonie Man, MD;  Location: [facility];  Service: Cardiovascular;  Laterality: N/A;   SPINAL CORD STIMULATOR BATTERY EXCHANGE N/A 01/19/2021   Procedure: Spinal cord stimulator battery change;  Surgeon: Reece Agar, MD;  Location: [place];  Service: Neurosurgery;  Laterality: N/A;   SPINAL CORD STIMULATOR INSERTION N/A 06/16/2016   Procedure: LUMBAR SPINAL CORD STIMULATOR INSERTION;  Surgeon: Clydell Hakim, MD;  Location: [place] [facility];  Service: Neurosurgery;  Laterality: N/A;  LUMBAR SPINAL CORD STIMULATOR INSERTION   tens  05/2016   TONSILLECTOMY     Social History:  reports that she quit smoking about 28 years ago. Her smoking use included cigarettes. She has a 11.25 pack-year smoking history. She has never used smokeless tobacco. She reports that she does not drink alcohol and does not use drugs.  Allergies  Allergen Reactions   Biaxin [Clarithromycin] Rash and Other (See Comments)    Blisters in mouth    Penicillins Rash and Other (See Comments)    Blisters in mouth Has patient had a PCN reaction causing immediate rash, facial/tongue/throat swelling, SOB or lightheadedness with hypotension: Yesyes Has patient had a PCN reaction causing severe rash involving mucus membranes or skin necrosis: Nono Has patient had a PCN reaction that required hospitalization Nono Has patient had a PCN reaction occurring within the last 10 years: Nono If all of the above answers are "NO", then may proceed   Hydroxychloroquine Other (See Comments)   Sulfamethoxazole     Other  Reaction(s): Other (See Comments)   Sulfasalazine Other (See Comments)   Losartan Potassium     Other reaction(s): elevated creatinine    Family History  Problem Relation Age of Onset   Heart attack Father    Heart attack Mother    Bladder Cancer Brother    Cervical cancer Daughter        ?   Colon cancer Neg Hx     Prior to Admission medications   Medication Sig Start Date End Date Taking? Authorizing Provider  albuterol (PROVENTIL HFA;VENTOLIN HFA) 108 (90 Base) MCG/ACT inhaler Take 2 puffs 3 times a day and every 4 hours as needed. 01/24/16   Rexene Alberts, MD  allopurinol (ZYLOPRIM) 300 MG tablet Take 300 mg by mouth daily.    [provider]  aspirin EC 81 MG EC tablet Take 1 tablet (81 mg total) by mouth daily. 05/03/16   Erlene Quan, PA-C  atorvastatin (LIPITOR) 80 MG tablet Take 80 mg by mouth daily.    [provider]  carvedilol (COREG) 6.25 MG tablet TAKE 1 AND 1/2 TABLETS(9.375 MG) BY MOUTH TWICE DAILY 01/15/23   Larey Dresser, MD  cholecalciferol (VITAMIN D3) 25 MCG (1000 UT) tablet Take 1,000 Units by mouth daily.    [provider]  Cyanocobalamin 2500 MCG SUBL 1 tablet Sublingual Once a day    [provider]  etanercept (ENBREL SURECLICK) 50 MG/ML injection INJECT 50 MG Subcutaneous ONCE WEEKLY for 30 days 07/17/22   [provider]  FARXIGA 10 MG TABS tablet TAKE 1 TABLET(10 MG) BY MOUTH DAILY BEFORE BREAKFAST 04/25/22   Larey Dresser, MD  ferrous sulfate 325 (65 FE) MG tablet Take 325 mg by mouth daily with breakfast. Every other day    [provider]  fexofenadine (ALLEGRA) 180 MG tablet Take 180 mg by mouth daily.    [provider]  furosemide (LASIX) 40 MG tablet Take 40 mg by mouth 2 (two) times daily.    [provider]  gabapentin (NEURONTIN) 400 MG capsule TAKE 1 CAPSULE(400 MG) BY MOUTH THREE TIMES DAILY 10/18/21   Pieter Partridge, DO  HYDROcodone-acetaminophen (NORCO) 10-325 MG tablet  Take 1 tablet by mouth every 6 (six) hours as needed for moderate pain.    [provider]  icosapent Ethyl (VASCEPA) 1 g capsule Take 1 capsule (1 g total) by mouth 2 (two) times daily. 01/24/23   Larey Dresser, MD  lansoprazole (PREVACID SOLUTAB) 30 MG disintegrating tablet Take 30 mg  by mouth daily at 12 noon.    [provider]  leflunomide (ARAVA) 20 MG tablet Take 20 mg by mouth daily.    [provider]  linaclotide Rolan Lipa) 72 MCG capsule Take 1 capsule (72 mcg total) by mouth daily before breakfast. 01/03/23   Mahala Menghini, PA-C  losartan (COZAAR) 100 MG tablet Take 100 mg by mouth daily.    [provider]  MAGNESIUM-OXIDE 400 (240 Mg) MG tablet TAKE 1 TABLET(400 MG) BY MOUTH DAILY 12/12/21   Josue Hector, MD  meclizine (ANTIVERT) 12.5 MG tablet Take 12.5 mg by mouth 3 (three) times daily as needed for dizziness.    [provider]  montelukast (SINGULAIR) 10 MG tablet Take 10 mg by mouth at bedtime.    [provider]  Multiple Vitamins-Minerals (OCUVITE ADULT 50+ PO) Take 1 tablet by mouth daily.    [provider]  nitroGLYCERIN (NITROSTAT) 0.4 MG SL tablet DISSOLVE 1 TABLET UNDER THE  TONGUE EVERY 5 MINUTES AS NEEDED FOR CHEST PAIN. MAX OF 3 TABLETS IN 15 MINUTES. CALL 911 IF PAIN  PERSISTS. 01/10/22   Evans Lance, MD  spironolactone (ALDACTONE) 25 MG tablet Take 0.5 tablets (12.5 mg total) by mouth daily. 10/30/22   Larey Dresser, MD  TRELEGY Cleaster Corin 200-62.5-25 MCG/ACT AEPB Inhale 1 puff into the lungs daily. 09/16/21   [provider]    Physical Exam: Vitals:   01/28/23 0725 01/28/23 0730 01/28/23 0755 01/28/23 0800  BP:  (!) 144/71 126/71 135/72  Pulse: (!) 156 (!) 145 (!) 140 (!) 138  Resp: (!) 36 (!) 31 (!) 34 (!) 33  Temp:      SpO2: 100% 100% 100% 100%   GENERAL:  A&O x 3, NAD, well developed, cooperative, follows commands HEENT: [place]/AT, No thrush, No icterus, No oral ulcers Neck:  No  neck mass, No meningismus, soft, supple CV: RRR, no S3, no S4, no rub, no JVD Lungs: Scattered rales.  No wheezing.  Good air movement Abd: soft/NT +BS, nondistended Ext: No edema, no lymphangitis, no cyanosis, no rashes Neuro:  CN II-XII intact, strength 4/5 in RUE, RLE, strength 4/5 LUE, LLE; sensation intact bilateral; no dysmetria; babinski equivocal  Data Reviewed: Data reviewed above in history.  Assessment and Plan: Sepsis -Secondary to pneumonia -Lactic acid 5.2 -Follow blood cultures -Start vancomycin pending culture data -Continue levofloxacin -Obtain UA   Lobar pneumonia -Check PCT -Continue levofloxacin -Start vancomycin pending culture data  Acute respiratory failure with hypoxia -Currently on BiPAP -Wean as tolerated -Secondary to pneumonia  Chronic HFrEF -12/29/2022 echo EF 30-35%, +WMA, grade 1 DD -Holding furosemide temporarily -Restart carvedilol at lower dose -Holding losartan temporarily -[employer] temporarily in the setting of active infection  CKD stage IIIa -Baseline creatinine 0.9-1.2 -Monitor BMP  Mixed hyperlipidemia -Continue statin  Coronary artery disease -Status post CABG 1996 -Continue aspirin -Continue Lipitor  Elevated troponin -due to demand ischemia -monitor clinically      Advance Care Planning: FULL  Consults: none  Family Communication: none  Severity of Illness: The appropriate patient status for this patient is INPATIENT. Inpatient status is judged to be reasonable and necessary in order to provide the required intensity of service to ensure the patient's safety. The patient's presenting symptoms, physical exam findings, and initial radiographic and laboratory data in the context of their chronic comorbidities is felt to place them at high risk for further clinical deterioration. Furthermore, it is not anticipated that the patient will be medically  stable for discharge from the hospital within 2 midnights of  admission.   * I certify that at the point of admission it is my clinical judgment that the patient will require inpatient hospital care spanning beyond 2 midnights from the point of admission due to high intensity of service, high risk for further deterioration and high frequency of surveillance required.*  Author: Orson Eva, MD 01/28/2023 8:59 AM  For on call review [email or web address].

## 2023-01-28 NOTE — Progress Notes (Signed)
Called to receive report. ED nurse was busy with another patient.

## 2023-01-28 NOTE — ED Provider Notes (Signed)
[facility] [place] [facility] Provider Note   CSN: [id number] Arrival date & time: 01/28/23  [id number]     History  Chief Complaint  Patient presents with   Shortness of Breath    Katherine Larson is a 77 y.o. female.   Shortness of Breath  This patient is a critically ill-appearing 77 year old female, she has a history of high cholesterol, hypertension, diabetes acid reflux and some underlying lung disease as she is on Trelegy.  It was also reported that she had a history of congestive heart failure, review of the medical record shows that her last echocardiogram was performed about a month agoAnd showed an ejection fraction of 30 to 35%.  She does have a pacemaker  The patient unfortunately developed COVID this month, she was seen on January 09, 2023.  She seemed to improve significantly but over the last few days has developed increasing shortness of breath.  The patient is only able to speak in 2 word sentences so it is difficult to obtain history but the paramedics report that they found the patient to be in severe respiratory distress, they tried CPAP prehospital but she did not tolerate it.  She was hypoxic and required a high flow nonrebreather.  She had diffusely rhonchorous breath sounds, she was diaphoretic and mildly edematous.  No other history available at this time     Home Medications Prior to Admission medications   Medication Sig Start Date End Date Taking? Authorizing Provider  albuterol (PROVENTIL HFA;VENTOLIN HFA) 108 (90 Base) MCG/ACT inhaler Take 2 puffs 3 times a day and every 4 hours as needed. 01/24/16   Rexene Alberts, MD  allopurinol (ZYLOPRIM) 300 MG tablet Take 300 mg by mouth daily.    [provider]  aspirin EC 81 MG EC tablet Take 1 tablet (81 mg total) by mouth daily. 05/03/16   Erlene Quan, PA-C  atorvastatin (LIPITOR) 80 MG tablet Take 80 mg by mouth daily.    [provider]  carvedilol (COREG) 6.25 MG  tablet TAKE 1 AND 1/2 TABLETS(9.375 MG) BY MOUTH TWICE DAILY 01/15/23   Larey Dresser, MD  cholecalciferol (VITAMIN D3) 25 MCG (1000 UT) tablet Take 1,000 Units by mouth daily.    [provider]  Cyanocobalamin 2500 MCG SUBL 1 tablet Sublingual Once a day    [provider]  etanercept (ENBREL SURECLICK) 50 MG/ML injection INJECT 50 MG Subcutaneous ONCE WEEKLY for 30 days 07/17/22   [provider]  FARXIGA 10 MG TABS tablet TAKE 1 TABLET(10 MG) BY MOUTH DAILY BEFORE BREAKFAST 04/25/22   Larey Dresser, MD  ferrous sulfate 325 (65 FE) MG tablet Take 325 mg by mouth daily with breakfast. Every other day    [provider]  fexofenadine (ALLEGRA) 180 MG tablet Take 180 mg by mouth daily.    [provider]  furosemide (LASIX) 40 MG tablet Take 40 mg by mouth 2 (two) times daily.    [provider]  gabapentin (NEURONTIN) 400 MG capsule TAKE 1 CAPSULE(400 MG) BY MOUTH THREE TIMES DAILY 10/18/21   Pieter Partridge, DO  HYDROcodone-acetaminophen (NORCO) 10-325 MG tablet Take 1 tablet by mouth every 6 (six) hours as needed for moderate pain.    [provider]  icosapent Ethyl (VASCEPA) 1 g capsule Take 1 capsule (1 g total) by mouth 2 (two) times daily. 01/24/23   Larey Dresser, MD  lansoprazole (PREVACID SOLUTAB) 30 MG disintegrating tablet Take 30 mg  by mouth daily at 12 noon.    [provider]  leflunomide (ARAVA) 20 MG tablet Take 20 mg by mouth daily.    [provider]  linaclotide Rolan Lipa) 72 MCG capsule Take 1 capsule (72 mcg total) by mouth daily before breakfast. 01/03/23   Mahala Menghini, PA-C  losartan (COZAAR) 100 MG tablet Take 100 mg by mouth daily.    [provider]  MAGNESIUM-OXIDE 400 (240 Mg) MG tablet TAKE 1 TABLET(400 MG) BY MOUTH DAILY 12/12/21   Josue Hector, MD  meclizine (ANTIVERT) 12.5 MG tablet Take 12.5 mg by mouth 3 (three) times daily as needed for dizziness.    [provider]  montelukast (SINGULAIR) 10 MG tablet Take 10 mg by mouth at bedtime.    [provider]  Multiple Vitamins-Minerals (OCUVITE ADULT 50+ PO) Take 1 tablet by mouth daily.    [provider]  nitroGLYCERIN (NITROSTAT) 0.4 MG SL tablet DISSOLVE 1 TABLET UNDER THE  TONGUE EVERY 5 MINUTES AS NEEDED FOR CHEST PAIN. MAX OF 3 TABLETS IN 15 MINUTES. CALL 911 IF PAIN  PERSISTS. 01/10/22   Evans Lance, MD  spironolactone (ALDACTONE) 25 MG tablet Take 0.5 tablets (12.5 mg total) by mouth daily. 10/30/22   Larey Dresser, MD  TRELEGY Cleaster Corin 200-62.5-25 MCG/ACT AEPB Inhale 1 puff into the lungs daily. 09/16/21   [provider]      Allergies    Biaxin [clarithromycin], Penicillins, Hydroxychloroquine, Sulfamethoxazole, Sulfasalazine, and Losartan potassium    Review of Systems   Review of Systems  Respiratory:  Positive for shortness of breath.   All other systems reviewed and are negative.   Physical Exam Updated Vital Signs BP 135/72   Pulse (!) 138   Temp 98.2 F (36.8 C)   Resp (!) 33   SpO2 100%  Physical Exam Vitals and nursing note reviewed.  Constitutional:      General: She is in acute distress.     Appearance: She is well-developed. She is ill-appearing, toxic-appearing and diaphoretic.  HENT:     Head: Normocephalic and atraumatic.     Nose: Rhinorrhea present. No congestion.     Mouth/Throat:     Mouth: Mucous membranes are moist.     Pharynx: No oropharyngeal exudate.  Eyes:     General: No scleral icterus.       Right eye: No discharge.        Left eye: No discharge.     Conjunctiva/sclera: Conjunctivae normal.     Pupils: Pupils are equal, round, and reactive to light.  Neck:     Thyroid: No thyromegaly.     Vascular: No JVD.  Cardiovascular:     Rate and Rhythm: Regular rhythm. Tachycardia present.     Heart sounds: Normal heart sounds. No murmur heard.    No friction rub. No gallop.     Comments: Tachycardic to 145  bpm Pulmonary:     Effort: Respiratory distress present.     Breath sounds: Wheezing, rhonchi and rales present.     Comments: Severe respiratory distress Abdominal:     General: Bowel sounds are normal. There is no distension.     Palpations: Abdomen is soft. There is no mass.     Tenderness: There is no abdominal tenderness.  Musculoskeletal:        General: No tenderness. Normal range of motion.     Cervical back: Normal range of motion and neck supple.     Right  lower leg: Edema present.     Left lower leg: Edema present.     Comments: 1+ symmetrical bilateral pretibial edema  Lymphadenopathy:     Cervical: No cervical adenopathy.  Skin:    General: Skin is warm.     Findings: No erythema or rash.  Neurological:     Mental Status: She is alert.     Coordination: Coordination normal.     Comments: Patient seems to be moving all 4 extremities, she is somnolent but easy to arouse at this point.  Severe respiratory distress  Psychiatric:        Behavior: Behavior normal.     ED Results / Procedures / Treatments   Labs (all labs ordered are listed, but only abnormal results are displayed) Labs Reviewed  BLOOD GAS, ARTERIAL - Abnormal; Notable for the following components:      Result Value   pCO2 arterial 29 (*)    pO2, Arterial 210 (*)    Bicarbonate 18.0 (*)    Acid-base deficit 5.5 (*)    Allens test (pass/fail) NOT APPLICABLE (*)    All other components within normal limits  LACTIC ACID, PLASMA - Abnormal; Notable for the following components:   Lactic Acid, Venous 5.2 (*)    All other components within normal limits  CBC WITH DIFFERENTIAL/PLATELET - Abnormal; Notable for the following components:   WBC 15.1 (*)    RBC 3.64 (*)    Hemoglobin 11.6 (*)    MCV 101.4 (*)    RDW 15.6 (*)    Neutro Abs 11.4 (*)    All other components within normal limits  COMPREHENSIVE METABOLIC PANEL - Abnormal; Notable for the following components:   CO2 18 (*)    Glucose, Bld  277 (*)    Calcium 8.8 (*)    All other components within normal limits  APTT - Abnormal; Notable for the following components:   aPTT 23 (*)    All other components within normal limits  CULTURE, BLOOD (ROUTINE X 2)  CULTURE, BLOOD (ROUTINE X 2)  RESP PANEL BY RT-PCR (RSV, FLU A&B, COVID)  RVPGX2  PROTIME-INR  LACTIC ACID, PLASMA  URINALYSIS, ROUTINE W REFLEX MICROSCOPIC  BRAIN NATRIURETIC PEPTIDE  TROPONIN I (HIGH SENSITIVITY)    EKG EKG Interpretation  Date/Time:  Sunday January 28 2023 06:55:18 EDT Ventricular Rate:  161 PR Interval:  32 QRS Duration: 144 QT Interval:  320 QTC Calculation: 524 R Axis:   133 Text Interpretation: Extreme tachycardia with wide complex, no further rhythm analysis attempted Artifact in lead(s) I III aVR aVL V3 V4 V5 V6 Confirmed by Noemi Chapel [id number]) on 01/28/2023 6:59:29 AM   EKG Interpretation  Date/Time:  Sunday January 28 2023 08:00:11 EDT Ventricular Rate:  138 PR Interval:  41 QRS Duration: 143 QT Interval:  350 QTC Calculation: 531 R Axis:   117 Text Interpretation: Sinus tachycardia Left bundle branch block Artifact in lead(s) I II III aVR aVL aVF V1 V3 V4 V5 V6 Since last tracing rate slower Confirmed by Noemi Chapel [phone]) on 01/28/2023 8:04:20 AM         Radiology DG Chest Port 1 View  Result Date: 01/28/2023 CLINICAL DATA:  Shortness of breath. EXAM: PORTABLE CHEST 1 VIEW COMPARISON:  PA Lat chest 10/04/2022, CTA chest 10/05/2022 FINDINGS: 7:12 a.m. There are partially translucent electrical pads overlying the left mid to lower lung field. Left-sided single lead pacer/AID and wire insertion are unaltered. 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 Stable mild cardiomegaly.  The  central vessels are normal caliber. There is patchy consolidation in the left lower lung field and scattered interstitial and airspace opacities of the right mid and lower lung, findings consistent with multilobar pneumonia. There is no substantial pleural effusion. The  mediastinum is normally outlined. There is aortic calcific plaque. Osteopenia and mild thoracic dextroscoliosis. IMPRESSION: Patchy consolidation in the left lower lung field and scattered interstitial and airspace opacities of the right mid and lower lung field, findings consistent with multilobar pneumonia. Clinical correlation and radiographic follow-up recommended. Electrical pads overlying the left chest. Electronically Signed   By: Telford Nab M.D.   On: 01/28/2023 07:31    Procedures .Critical Care  Performed by: Noemi Chapel, MD Authorized by: Noemi Chapel, MD   Critical care provider statement:    Critical care time (minutes):  45   Critical care time was exclusive of:  Separately billable procedures and treating other patients   Critical care was necessary to treat or prevent imminent or life-threatening deterioration of the following conditions:  Respiratory failure and sepsis   Critical care was time spent personally by me on the following activities:  Development of treatment plan with patient or surrogate, discussions with consultants, evaluation of patient's response to treatment, examination of patient, obtaining history from patient or surrogate, review of old charts, re-evaluation of patient's condition, pulse oximetry, ordering and review of radiographic studies, ordering and review of laboratory studies, ordering and performing treatments and interventions and blood draw for specimens   I assumed direction of critical care for this patient from another provider in my specialty: no     Care discussed with: admitting provider   Comments:           Medications Ordered in ED Medications  succinylcholine (ANECTINE) 200 MG/10ML syringe (  Not Given 01/28/23 0758)  lactated ringers infusion ( Intravenous New Bag/Given 01/28/23 0751)  levofloxacin (LEVAQUIN) IVPB 750 mg (750 mg Intravenous New Bag/Given 01/28/23 0740)  sodium chloride 0.9 % bolus 1,000 mL (has no administration  in time range)  sodium chloride 0.9 % bolus 1,000 mL (1,000 mLs Intravenous New Bag/Given 01/28/23 0749)  ondansetron (ZOFRAN) injection 4 mg (4 mg Intravenous Given 01/28/23 [id number])    ED Course/ Medical Decision Making/ A&P                             Medical Decision Making Amount and/or Complexity of Data Reviewed Labs: ordered. Radiology: ordered.  Risk Prescription drug management. Decision regarding hospitalization.   This patient presents to the ED for concern of severe shortness of breath, this involves an extensive number of treatment options, and is a complaint that carries with it a high risk of complications and morbidity.  The differential diagnosis includes pneumonia, pneumothorax, congestive heart failure, less likely to be pulmonary embolism   Co morbidities that complicate the patient evaluation  Congestive heart failure with poor ejection fraction, recent COVID   Additional history obtained:  Additional history obtained from electronic medical record External records from outside source obtained and reviewed including echo car gram and prior office visits   Lab Tests:  I Ordered, and personally interpreted labs.  The pertinent results include: Sepsis order set.  Elevated lactic acid and leukocytosis   Imaging Studies ordered:  I ordered imaging studies including chest x-ray I independently visualized and interpreted imaging which showed multifocal pneumonia I agree with the radiologist interpretation   Cardiac Monitoring: / EKG:  The patient was  maintained on a cardiac monitor.  I personally viewed and interpreted the cardiac monitored which showed an underlying rhythm of: Sinus tachycardia   Consultations Obtained:  I requested consultation with the hospitalist,  and discussed lab and imaging findings as well as pertinent plan - they recommend: Admission   Problem List / ED Course / Critical interventions / Medication management  The patient's  lab work and x-ray was suggestive of multifocal pneumonia and sepsis.  She was tachycardic hypoxic with a leukocytosis and a source of infection and multifocal pneumonia I ordered medication including Rocephin, Zithromax and some IV fluids for sepsis and infection, Zofran ordered for nausea.  Lactic of 5.2, 30cc /kg of IVF ordered Reevaluation of the patient after these medicines showed that the patient improved/.  However the patient did require BiPAP due to severe respiratory illness and respiratory failure.  She improved with this I have reviewed the patients home medicines and have made adjustments as needed   Social Determinants of Health:  Recent COVID-19 infection within the last 3 weeks   Test / Admission - Considered:  Will need to be admitted to higher level of care possibly ICU   Due to the critical nature of the patient's illness and the lack of good peripheral IV access I was assisting nursing in placing IV.  I personally placed an IV in the right wrist.  This is a 20-gauge IV.  I was able to draw blood and flushed.  Angiocath insertion Performed by: Johnna Acosta  Consent: Verbal consent obtained. Risks and benefits: risks, benefits and alternatives were discussed Time out: Immediately prior to procedure a "time out" was called to verify the correct patient, procedure, equipment, support staff and site/side marked as required.  Preparation: Patient was prepped and draped in the usual sterile fashion.  Vein Location: R wrist  Not Ultrasound Guided  Gauge: 20  Normal blood return and flush without difficulty Patient tolerance: Patient tolerated the procedure well with no immediate complications.           Final Clinical Impression(s) / ED Diagnoses Final diagnoses:  Acute respiratory failure with hypoxia ([place])  Multifocal pneumonia  Septic shock [facility])    Rx / DC Orders ED Discharge Orders     None         Noemi Chapel, MD 01/28/23 [phone]

## 2023-01-28 NOTE — ED Triage Notes (Signed)
Pt from home via [facility] c/o shortness of breath. Per EMS SPOs was 88% on RA. Upon Arrival she is on NRB at 95% with retractions.  IN route EMS administered nitro for BP of 150/78. Per EMS patient was placed on CPAP briefly but could not tolerate. [employer] RN

## 2023-01-29 DIAGNOSIS — J181 Lobar pneumonia, unspecified organism: Secondary | ICD-10-CM | POA: Diagnosis not present

## 2023-01-29 DIAGNOSIS — A419 Sepsis, unspecified organism: Secondary | ICD-10-CM | POA: Diagnosis not present

## 2023-01-29 DIAGNOSIS — J9601 Acute respiratory failure with hypoxia: Secondary | ICD-10-CM | POA: Diagnosis not present

## 2023-01-29 DIAGNOSIS — I5022 Chronic systolic (congestive) heart failure: Secondary | ICD-10-CM | POA: Diagnosis not present

## 2023-01-29 LAB — BLOOD CULTURE ID PANEL (REFLEXED) - BCID2

## 2023-01-29 LAB — CBC
HCT: 31 % — ABNORMAL LOW (ref 36.0–46.0)
Hemoglobin: 9.2 g/dL — ABNORMAL LOW (ref 12.0–15.0)
MCH: 31.8 pg (ref 26.0–34.0)
MCHC: 29.7 g/dL — ABNORMAL LOW (ref 30.0–36.0)
MCV: 107.3 fL — ABNORMAL HIGH (ref 80.0–100.0)
Platelets: 142 10*3/uL — ABNORMAL LOW (ref 150–400)
RBC: 2.89 MIL/uL — ABNORMAL LOW (ref 3.87–5.11)
RDW: 15.7 % — ABNORMAL HIGH (ref 11.5–15.5)
WBC: 16.1 10*3/uL — ABNORMAL HIGH (ref 4.0–10.5)
nRBC: 0 % (ref 0.0–0.2)

## 2023-01-29 LAB — BASIC METABOLIC PANEL
Anion gap: 8 (ref 5–15)
BUN: 22 mg/dL (ref 8–23)
CO2: 21 mmol/L — ABNORMAL LOW (ref 22–32)
Calcium: 8.2 mg/dL — ABNORMAL LOW (ref 8.9–10.3)
Chloride: 109 mmol/L (ref 98–111)
Creatinine, Ser: 0.82 mg/dL (ref 0.44–1.00)
GFR, Estimated: >60 mL/min (ref >60)
Glucose, Bld: 83 mg/dL (ref 70–99)
Potassium: 4.5 mmol/L (ref 3.5–5.1)
Sodium: 138 mmol/L (ref 135–145)

## 2023-01-29 LAB — PROCALCITONIN: Procalcitonin: 34.14 ng/mL

## 2023-01-29 LAB — HEMOGLOBIN A1C
Hgb A1c MFr Bld: 6.2 % — ABNORMAL HIGH (ref 4.8–5.6)
Mean Plasma Glucose: 131 mg/dL

## 2023-01-29 LAB — LACTIC ACID, PLASMA: Lactic Acid, Venous: 1.6 mmol/L (ref 0.5–1.9)

## 2023-01-29 LAB — BRAIN NATRIURETIC PEPTIDE: B Natriuretic Peptide: 1670 pg/mL — ABNORMAL HIGH (ref 0.0–100.0)

## 2023-01-29 MED ORDER — CARVEDILOL 3.125 MG PO TABS
3.1250 mg | ORAL_TABLET | Freq: Two times a day (BID) | ORAL | Status: DC
  Administered 2023-01-29 (×2): 3.125 mg via ORAL
  Filled 2023-01-29 (×3): qty 1

## 2023-01-29 MED ORDER — FUROSEMIDE 40 MG PO TABS
40.0000 mg | ORAL_TABLET | Freq: Two times a day (BID) | ORAL | Status: DC
  Administered 2023-01-29: 40 mg via ORAL
  Filled 2023-01-29 (×2): qty 1

## 2023-01-29 MED ORDER — FUROSEMIDE 10 MG/ML IJ SOLN
40.0000 mg | Freq: Once | INTRAMUSCULAR | Status: AC
  Administered 2023-01-29: 40 mg via INTRAVENOUS
  Filled 2023-01-29: qty 4

## 2023-01-29 MED ORDER — SODIUM CHLORIDE 0.9 % IV SOLN
1.0000 g | Freq: Three times a day (TID) | INTRAVENOUS | Status: DC
  Administered 2023-01-29 – 2023-01-30 (×3): 1 g via INTRAVENOUS
  Filled 2023-01-29 (×3): qty 20

## 2023-01-29 NOTE — Progress Notes (Signed)
Date and time results received: 01/29/23 1451   Test: Blood Culture Critical Value: gram positive rods  Name of Provider Notified: Tat  Orders Received? Or Actions Taken?: awaiting response

## 2023-01-29 NOTE — Progress Notes (Signed)
  Transition of Care [facility]) Screening Note   Patient Details  Name: Katherine Larson Date of Birth: 1946-09-19   Transition of Care [facility]) CM/SW Contact:    Iona Beard, [place] Phone Number: 01/29/2023, 1:16 PM    Transition of Care Department [facility]) has reviewed patient and no TOC needs have been identified at this time. We will continue to monitor patient advancement through interdisciplinary progression rounds. If new patient transition needs arise, please place a TOC consult.

## 2023-01-29 NOTE — Progress Notes (Signed)
PROGRESS NOTE  ALOHA Larson [id number] DOB: 13-May-1946 DOA: 01/28/2023 PCP: Harlan Stains, MD  Brief History:  77 year old female with a history of coronary artery disease, hypertension, hyperlipidemia, HFrEF (EF 30-35%) status post ICD, GERD, lymphocytic colitis, and left bundle branch block presenting with 1 day history of shortness of breath.  The patient was diagnosed with COVID on 01/09/2023 and given a 5-day course of Paxlovid.  She finished the course.  She was doing fine until the past 24 hours when she developed worsening shortness of breath.  She denies any fevers, chills, headache, hemoptysis, diarrhea, abdominal pain.  She had an episode of nausea and vomiting.  There is no hematemesis.  Upon EMS arrival, the patient was noted to be hypoxic with saturation in the 80s on room air.  She had difficulty tolerating CPAP and was placed on nonrebreather.  Upon arrival in the emergency department, the patient was placed on BiPAP. In the ED, the patient was afebrile and hemodynamically stable with oxygen saturation 100% on BiPAP.  WBC 15.1, hemoglobin 11.6, platelets 214,000.  Sodium 136, potassium 5.0, bicarbonate 18, serum creatinine 0.95.  LFTs were unremarkable.  Lactic acid was 5.2. troponin 169.  BNP 503.  Chest x-ray showed left lower lobe, right lower lobe, and right middle lobe opacities.  The patient was started on levofloxacin.  She was given 2 L of IV fluid and started on maintenance fluids.   Assessment/Plan: Sepsis -Secondary to pneumonia -Lactic acid 5.2 -Follow blood cultures -Start vancomycin pending culture data -Continue levofloxacin -Obtain UA 6-10 WBC   Lobar pneumonia -Check PCT 7.52>>34.14 -Continue levofloxacin -d/c vanc/aztreonam -start merrem   Acute respiratory failure with hypoxia -Currently on BiPAP>>3L -Wean as tolerated -Secondary to pneumonia   Chronic HFrEF -12/29/2022 echo EF 30-35%, +WMA, grade 1 DD -Holding furosemide  initially -Restart carvedilol at lower dose -Holding losartan temporarily -[employer] temporarily in the setting of active infection -now with signs of fluid overload>>lasix IV x 1 and then restart po lasix   CKD stage IIIa -Baseline creatinine 0.9-1.2 -Monitor BMP   Mixed hyperlipidemia -Continue statin   Coronary artery disease -Status post CABG 1996 -Continue aspirin -Continue Lipitor   Elevated troponin -due to demand ischemia -monitor clinically      Family Communication:  spouse updated  Consultants:  none  Code Status:  FULL   DVT Prophylaxis:  [place] Lovenox   Procedures: As Listed in Progress Note Above  Antibiotics: Levoflox 3/10 Vanc/aztreonam 3/10 Merrem 3/11>>      Subjective: Pt states she is breathing better. Complains of back pain.  Denies cp, hemoptysis, n/v/d, abd pain.  Objective: Vitals:   01/29/23 0412 01/29/23 0500 01/29/23 0600 01/29/23 0800  BP: (!) 124/52 (!) 123/39 (!) 131/44 (!) 118/46  Pulse: 94 79 80 88  Resp: (!) '26 16 16 18  '$ Temp:    97.9 F (36.6 C)  TempSrc:      SpO2: 100% 100% 100% 100%  Weight:      Height:        Intake/Output Summary (Last 24 hours) at 01/29/2023 0929 Last data filed at 01/29/2023 0441 Gross per 24 hour  Intake 3491.32 ml  Output 700 ml  Net 2791.32 ml   Weight change:  Exam:  General:  Pt is alert, follows commands appropriately, not in acute distress HEENT: No icterus, No thrush, No neck mass, [place]/AT Cardiovascular: RRR, S1/S2, no rubs, no gallops Respiratory: bibasilar crackles. No wheeze Abdomen: Soft/+BS,  non tender, non distended, no guarding Extremities: No edema, No lymphangitis, No petechiae, No rashes, no synovitis   Data Reviewed: I have personally reviewed following labs and imaging studies Basic Metabolic Panel: Recent Labs  Lab 01/28/23 0700 01/29/23 0310  NA 136 138  K 5.0 4.5  CL 105 109  CO2 18* 21*  GLUCOSE 277* 83  BUN 21 22  CREATININE 0.95 0.82   CALCIUM 8.8* 8.2*   Liver Function Tests: Recent Labs  Lab 01/28/23 0700  AST 28  ALT 26  ALKPHOS 108  BILITOT 0.9  PROT 7.3  ALBUMIN 3.6   No results for input(s): "LIPASE", "AMYLASE" in the last 168 hours. No results for input(s): "AMMONIA" in the last 168 hours. Coagulation Profile: Recent Labs  Lab 01/28/23 0700  INR 1.1   CBC: Recent Labs  Lab 01/28/23 0658 01/29/23 0310  WBC 15.1* 16.1*  NEUTROABS 11.4*  --   HGB 11.6* 9.2*  HCT 36.9 31.0*  MCV 101.4* 107.3*  PLT 214 142*   Cardiac Enzymes: No results for input(s): "CKTOTAL", "CKMB", "CKMBINDEX", "TROPONINI" in the last 168 hours. BNP: Invalid input(s): "POCBNP" CBG: Recent Labs  Lab 01/28/23 1419 01/28/23 1728  GLUCAP 133* 126*   HbA1C: No results for input(s): "HGBA1C" in the last 72 hours. Urine analysis:    Component Value Date/Time   COLORURINE YELLOW 01/28/2023 1206   APPEARANCEUR HAZY (A) 01/28/2023 1206   LABSPEC 1.020 01/28/2023 1206   PHURINE 5.5 01/28/2023 1206   GLUCOSEU >=500 (A) 01/28/2023 1206   HGBUR NEGATIVE 01/28/2023 [place] 01/28/2023 1206   KETONESUR NEGATIVE 01/28/2023 1206   PROTEINUR NEGATIVE 01/28/2023 1206   NITRITE POSITIVE (A) 01/28/2023 1206   LEUKOCYTESUR NEGATIVE 01/28/2023 1206   Sepsis Labs: '@LABRCNTIP'$ (procalcitonin:4,lacticidven:4) ) Recent Results (from the past 240 hour(s))  Blood culture (routine x 2)     Status: None (Preliminary result)   Collection Time: 01/28/23  6:59 AM   Specimen: BLOOD RIGHT WRIST  Result Value Ref Range Status   Specimen Description   Final    BLOOD RIGHT WRIST BOTTLES DRAWN AEROBIC AND ANAEROBIC   Special Requests Blood Culture adequate volume  Final   Culture   Final    NO GROWTH < 24 HOURS Performed at [facility], [address], [place], [place] [address]    Report Status PENDING  Incomplete  Blood culture (routine x 2)     Status: None (Preliminary result)   Collection Time: 01/28/23  7:04 AM    Specimen: BLOOD RIGHT FOREARM  Result Value Ref Range Status   Specimen Description   Final    BLOOD RIGHT FOREARM BOTTLES DRAWN AEROBIC AND ANAEROBIC   Special Requests Blood Culture adequate volume  Final   Culture   Final    NO GROWTH < 24 HOURS Performed at [facility], [address] [place], [place] [address]    Report Status PENDING  Incomplete  Resp panel by RT-PCR (RSV, Flu A&B, Covid) Anterior Nasal Swab     Status: Abnormal   Collection Time: 01/28/23  8:11 AM   Specimen: Anterior Nasal Swab  Result Value Ref Range Status   SARS Coronavirus 2 by RT PCR POSITIVE (A) NEGATIVE Final    Comment: (NOTE) SARS-CoV-2 target nucleic acids are DETECTED.  The SARS-CoV-2 RNA is generally detectable in upper respiratory specimens during the acute phase of infection. Positive results are indicative of the presence of the identified virus, but do not rule out bacterial infection or co-infection  with other pathogens not detected by the test. Clinical correlation with patient history and other diagnostic information is necessary to determine patient infection status. The expected result is Negative.  Fact Sheet for Patients: EntrepreneurPulse.com.au  Fact Sheet for Healthcare Providers: IncredibleEmployment.be  This test is not yet approved or cleared by the [place] FDA and  has been authorized for detection and/or diagnosis of SARS-CoV-2 by FDA under an Emergency Use Authorization (EUA).  This EUA will remain in effect (meaning this test can be used) for the duration of  the COVID-19 declaration under Section 564(b)(1) of the A ct, 21 U.S.C. section 360bbb-3(b)(1), unless the authorization is terminated or revoked sooner.     Influenza A by PCR NEGATIVE NEGATIVE Final   Influenza B by PCR NEGATIVE NEGATIVE Final    Comment: (NOTE) The Xpert Xpress SARS-CoV-2/FLU/RSV plus assay is intended as an aid in the diagnosis of influenza from  Nasopharyngeal swab specimens and should not be used as a sole basis for treatment. Nasal washings and aspirates are unacceptable for Xpert Xpress SARS-CoV-2/FLU/RSV testing.  Fact Sheet for Patients: EntrepreneurPulse.com.au  Fact Sheet for Healthcare Providers: IncredibleEmployment.be  This test is not yet approved or cleared by the [place] FDA and has been authorized for detection and/or diagnosis of SARS-CoV-2 by FDA under an Emergency Use Authorization (EUA). This EUA will remain in effect (meaning this test can be used) for the duration of the COVID-19 declaration under Section 564(b)(1) of the Act, 21 U.S.C. section 360bbb-3(b)(1), unless the authorization is terminated or revoked.     Resp Syncytial Virus by PCR NEGATIVE NEGATIVE Final    Comment: (NOTE) Fact Sheet for Patients: EntrepreneurPulse.com.au  Fact Sheet for Healthcare Providers: IncredibleEmployment.be  This test is not yet approved or cleared by the [place] FDA and has been authorized for detection and/or diagnosis of SARS-CoV-2 by FDA under an Emergency Use Authorization (EUA). This EUA will remain in effect (meaning this test can be used) for the duration of the COVID-19 declaration under Section 564(b)(1) of the Act, 21 U.S.C. section 360bbb-3(b)(1), unless the authorization is terminated or revoked.  Performed at [facility], [address], [place], [place] [address]   MRSA Next Gen by PCR, Nasal     Status: None   Collection Time: 01/28/23 10:09 AM   Specimen: Nasal Mucosa; Nasal Swab  Result Value Ref Range Status   MRSA by PCR Next Gen NOT DETECTED NOT DETECTED Final    Comment: (NOTE) The GeneXpert MRSA Assay (FDA approved for NASAL specimens only), is one component of a comprehensive MRSA colonization surveillance program. It is not intended to diagnose MRSA infection nor to guide or monitor treatment for  MRSA infections. Test performance is not FDA approved in patients less than 53 years old. Performed at [facility], [address], [place], [place] [address]      Scheduled Meds:  aspirin EC  81 mg Oral Daily   atorvastatin  80 mg Oral Daily   Chlorhexidine Gluconate Cloth  6 each Topical Daily   enoxaparin (LOVENOX) injection  40 mg Subcutaneous Q24H   gabapentin  300 mg Oral TID   leflunomide  20 mg Oral Daily   omega-3 acid ethyl esters  1 g Oral BID   mouth rinse  15 mL Mouth Rinse 4 times per day   Continuous Infusions:  aztreonam 200 mL/hr at 01/29/23 0441   vancomycin      Procedures/Studies: DG Chest Port 1 View  Result Date: 01/28/2023 CLINICAL DATA:  Shortness of breath. EXAM: PORTABLE CHEST 1 VIEW COMPARISON:  PA Lat chest 10/04/2022, CTA chest 10/05/2022 FINDINGS: 7:12 a.m. There are partially translucent electrical pads overlying the left mid to lower lung field. Left-sided single lead pacer/AID and wire insertion are unaltered. 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 Stable mild cardiomegaly.  The central vessels are normal caliber. There is patchy consolidation in the left lower lung field and scattered interstitial and airspace opacities of the right mid and lower lung, findings consistent with multilobar pneumonia. There is no substantial pleural effusion. The mediastinum is normally outlined. There is aortic calcific plaque. Osteopenia and mild thoracic dextroscoliosis. IMPRESSION: Patchy consolidation in the left lower lung field and scattered interstitial and airspace opacities of the right mid and lower lung field, findings consistent with multilobar pneumonia. Clinical correlation and radiographic follow-up recommended. Electrical pads overlying the left chest. Electronically Signed   By: Telford Nab M.D.   On: 01/28/2023 07:31   DG BONE DENSITY (DXA)  Result Date: 01/17/2023 EXAM: DUAL X-RAY ABSORPTIOMETRY (DXA) FOR BONE MINERAL DENSITY IMPRESSION: Referring Physician:  [place] Your patient completed a bone mineral density test using GE Lunar iDXA system (analysis version: 16). Technologist: lmn PATIENT: Name: Avea, Pavloff Patient ID: [id number] Birth Date: Aug 29, 1946 Height: 62.5 in. Sex: Female Measured: 01/17/2023 Weight: 129.2 lbs. Indications: Advanced Age, Caucasian, COPD, Diabetic non insulin, Gabapentin, Height Loss (781.91), Hysterectomy, One ovary removed, Postmenopausal, Prevacid, Secondary Osteoporosis Fractures: None Treatments: Vitamin D (E933.5) ASSESSMENT: The BMD measured at Femur Neck Right is 0.766 g/cm2 with a T-score of -2.0. This patient is considered osteopenic/low bone mass according to [facility]) criteria. The quality of the exam is good. The lumbar spine was excluded due to degenerative changes as noted on previous exam. Site Region Measured Date Measured Age YA BMD Significant CHANGE T-score DualFemur Neck Right 01/17/2023 75.1 -3.0 0.766 g/cm2 DualFemur Neck Right 06/03/2019 71.5 -2.1 0.742 g/cm2 DualFemur Total Mean 01/17/2023 75.1 -1.0 0.887 g/cm2 * DualFemur Total Mean 06/03/2019 71.5 -0.1 1.001 g/cm2 Left Forearm Radius 33% 01/17/2023 75.1 -1.2 0.774 g/cm2 * Left Forearm Radius 33% 06/03/2019 71.5 -0.2 0.868 g/cm2 World Health Organization [facility]) criteria for post-menopausal, Caucasian Women: Normal       T-score at or above -1 SD Osteopenia   T-score between -1 and -2.5 SD Osteoporosis T-score at or below -2.5 SD RECOMMENDATION: 1. All patients should optimize calcium and vitamin D intake. 2. Consider FDA-approved medical therapies in postmenopausal women and men aged 63 years and older, based on the following: a. A hip or vertebral (clinical or morphometric) fracture. b. T-score = -2.5 at the femoral neck or spine after appropriate evaluation to exclude secondary causes. c. Low bone mass (T-score between -1.0 and -2.5 at the femoral neck or spine) and a 10-year probability of a hip fracture = 3% or a 10-year probability of a  major osteoporosis-related fracture = 20% based on the [place]-adapted WHO algorithm. d. Clinician judgment and/or patient preferences may indicate treatment for people with 10-year fracture probabilities above or below these levels. FOLLOW-UP: Patients with diagnosis of osteoporosis or at high risk for fracture should have regular bone mineral density tests.? Patients eligible for Medicare are allowed routine testing every 2 years.? The testing frequency can be increased to one year for patients who have rapidly progressing disease, are receiving or discontinuing medical therapy to restore bone mass, or have additional risk factors. I have reviewed this study and agree with the findings. Mark A. Thornton Papas, M.D. [facility]  FRAX* 10-year Probability of Fracture Based on femoral neck BMD: DualFemur (Right) Major Osteoporotic Fracture: 12.5% Hip Fracture:                3.3% Population:                  [place] (Caucasian) Risk Factors:                Secondary Osteoporosis *FRAX is a [place] [employer] for Metabolic Bone Disease, a [place] (WHO) [facility]. ASSESSMENT: The probability of a major osteoporotic fracture is 12.5% within the next ten years. The probability of a hip fracture is 3.3% within the next ten years. I have reviewed this report and agree with the above findings. Mark A. Thornton Papas, M.D. [facility] Electronically Signed   By: Lavonia Dana M.D.   On: 01/17/2023 12:04    Orson Eva, DO  [facility]  If 7PM-7AM, please contact night-coverage [email or web address] Password [email or web address] 01/29/2023, 9:29 AM   LOS: 1 day

## 2023-01-29 NOTE — Progress Notes (Signed)
Notified by nursing pt had positive blood culture>>"gram positive rods"  I reviewed EPIC>>blood cultures = GNR -pt started on meropenem already earlier.  DTat

## 2023-01-29 NOTE — Progress Notes (Signed)
Per night shift RT patient did not need BIPAP last night. Patient remained on 3L nasal cannula and tolerated well.

## 2023-01-30 ENCOUNTER — Inpatient Hospital Stay (HOSPITAL_COMMUNITY): Payer: Medicare Other

## 2023-01-30 DIAGNOSIS — J181 Lobar pneumonia, unspecified organism: Secondary | ICD-10-CM | POA: Diagnosis not present

## 2023-01-30 DIAGNOSIS — I447 Left bundle-branch block, unspecified: Secondary | ICD-10-CM | POA: Diagnosis not present

## 2023-01-30 DIAGNOSIS — A4152 Sepsis due to Pseudomonas: Secondary | ICD-10-CM | POA: Diagnosis not present

## 2023-01-30 DIAGNOSIS — B965 Pseudomonas (aeruginosa) (mallei) (pseudomallei) as the cause of diseases classified elsewhere: Secondary | ICD-10-CM

## 2023-01-30 DIAGNOSIS — I25810 Atherosclerosis of coronary artery bypass graft(s) without angina pectoris: Secondary | ICD-10-CM

## 2023-01-30 DIAGNOSIS — Z95810 Presence of automatic (implantable) cardiac defibrillator: Secondary | ICD-10-CM

## 2023-01-30 DIAGNOSIS — R7881 Bacteremia: Secondary | ICD-10-CM

## 2023-01-30 DIAGNOSIS — I255 Ischemic cardiomyopathy: Secondary | ICD-10-CM

## 2023-01-30 DIAGNOSIS — J9601 Acute respiratory failure with hypoxia: Secondary | ICD-10-CM | POA: Diagnosis not present

## 2023-01-30 LAB — COMPREHENSIVE METABOLIC PANEL
ALT: 18 U/L (ref 0–44)
AST: 17 U/L (ref 15–41)
Albumin: 2.7 g/dL — ABNORMAL LOW (ref 3.5–5.0)
Alkaline Phosphatase: 85 U/L (ref 38–126)
Anion gap: 9 (ref 5–15)
BUN: 20 mg/dL (ref 8–23)
CO2: 25 mmol/L (ref 22–32)
Calcium: 8.1 mg/dL — ABNORMAL LOW (ref 8.9–10.3)
Chloride: 101 mmol/L (ref 98–111)
Creatinine, Ser: 0.87 mg/dL (ref 0.44–1.00)
GFR, Estimated: >60 mL/min (ref >60)
Glucose, Bld: 125 mg/dL — ABNORMAL HIGH (ref 70–99)
Potassium: 3.5 mmol/L (ref 3.5–5.1)
Sodium: 135 mmol/L (ref 135–145)
Total Bilirubin: 0.5 mg/dL (ref 0.3–1.2)
Total Protein: 6.1 g/dL — ABNORMAL LOW (ref 6.5–8.1)

## 2023-01-30 LAB — CBC
HCT: 29.9 % — ABNORMAL LOW (ref 36.0–46.0)
Hemoglobin: 9 g/dL — ABNORMAL LOW (ref 12.0–15.0)
MCH: 31.1 pg (ref 26.0–34.0)
MCHC: 30.1 g/dL (ref 30.0–36.0)
MCV: 103.5 fL — ABNORMAL HIGH (ref 80.0–100.0)
Platelets: 163 10*3/uL (ref 150–400)
RBC: 2.89 MIL/uL — ABNORMAL LOW (ref 3.87–5.11)
RDW: 15.7 % — ABNORMAL HIGH (ref 11.5–15.5)
WBC: 15.8 10*3/uL — ABNORMAL HIGH (ref 4.0–10.5)
nRBC: 0 % (ref 0.0–0.2)

## 2023-01-30 LAB — MAGNESIUM: Magnesium: 1.6 mg/dL — ABNORMAL LOW (ref 1.7–2.4)

## 2023-01-30 LAB — PHOSPHORUS: Phosphorus: 2.8 mg/dL (ref 2.5–4.6)

## 2023-01-30 MED ORDER — FUROSEMIDE 40 MG PO TABS
40.0000 mg | ORAL_TABLET | Freq: Two times a day (BID) | ORAL | Status: DC
  Administered 2023-01-30 – 2023-02-03 (×8): 40 mg via ORAL
  Filled 2023-01-30 (×8): qty 1

## 2023-01-30 MED ORDER — LEVOFLOXACIN 500 MG PO TABS
750.0000 mg | ORAL_TABLET | Freq: Every day | ORAL | Status: DC
  Administered 2023-01-30: 750 mg via ORAL
  Filled 2023-01-30: qty 2

## 2023-01-30 MED ORDER — MAGNESIUM SULFATE 2 GM/50ML IV SOLN
2.0000 g | Freq: Once | INTRAVENOUS | Status: AC
  Administered 2023-01-30: 2 g via INTRAVENOUS
  Filled 2023-01-30: qty 50

## 2023-01-30 MED ORDER — SODIUM CHLORIDE 0.9 % IV SOLN
2.0000 g | Freq: Two times a day (BID) | INTRAVENOUS | Status: DC
  Administered 2023-01-30 – 2023-02-03 (×8): 2 g via INTRAVENOUS
  Filled 2023-01-30 (×8): qty 12.5

## 2023-01-30 MED ORDER — CARVEDILOL 3.125 MG PO TABS
6.2500 mg | ORAL_TABLET | Freq: Two times a day (BID) | ORAL | Status: DC
  Administered 2023-01-30 – 2023-02-03 (×9): 6.25 mg via ORAL
  Filled 2023-01-30 (×9): qty 2

## 2023-01-30 MED ORDER — FUROSEMIDE 10 MG/ML IJ SOLN
40.0000 mg | Freq: Once | INTRAMUSCULAR | Status: AC
  Administered 2023-01-30: 40 mg via INTRAVENOUS
  Filled 2023-01-30: qty 4

## 2023-01-30 MED ORDER — POTASSIUM CHLORIDE CRYS ER 20 MEQ PO TBCR
20.0000 meq | EXTENDED_RELEASE_TABLET | Freq: Every day | ORAL | Status: DC
  Administered 2023-01-30 – 2023-02-03 (×5): 20 meq via ORAL
  Filled 2023-01-30 (×5): qty 1

## 2023-01-30 NOTE — Consult Note (Incomplete)
Cardiology Consultation   Patient ID: Katherine Larson MRN: [id number]; DOB: 06/17/1946  Admit date: 01/28/2023 Date of Consult: 01/30/2023  PCP:  Harlan Stains, MD   [facility] Cardiologist:  Jenkins Rouge, MD   { Click here to update MD or APP on Care Team, Refresh:1}     Patient Profile:   Katherine Larson is a 77 y.o. female with a hx of HFrEF (30-35%) status post ICD, CAD, hypertension, hyperlipidemia, left bundle branch block, and recent COVID infection who is being seen 01/30/2023 for the evaluation of Pseudomonas bacteremia with possible need for TEE at the request of Dr. Shanon Brow Tat.  History of Present Illness:   Katherine Larson is a 77 year old female with a hx of HFrEF (30-35%) status post ICD, CAD, hypertension, hyperlipidemia, left bundle branch block, and COVID infection (01/09/23) who presented to the ED via EMS due to worsening shortness of breath. Patient was doing well after COVID but became increasingly dyspneic which prompted call to EMS.   Upon EMS arrival she was hypoxic in the 80's on RA. Upon ED arrival, she was placed on BiPAP with improvement in O2 saturation to 100%. WBC 15.1, hemoglobin 11.6, platelets 214,000.  Sodium 136, potassium 5.0, bicarbonate 18, serum creatinine 0.95.  LFTs were unremarkable.  Lactic acid was 5.2. troponin 169.  BNP 503.  Chest x-ray showed left lower lobe, right lower lobe, and right middle lobe opacities.  The patient was started on levofloxacin. She was given 2 L of IV fluid and started on maintenance fluids. Blood cultures grew Pseudomonas (repeat culture pending) and patient also currently on Merrem after originally being placed on Vanc/Aztreonam while blood cultures were pending. U/A with microscopy showed many bacteria, nitrite positive, and  > 500 glucose. Patient denies fever, chills, hemoptysis, headache, or change in urinary/bowel habits.      Past Medical History:  Diagnosis Date   Anemia    anemia of chronic  disease +/- IDA followed by Dr. Earlie Server. received iron infusions in the past   Asthma    Borderline glaucoma    CAD (coronary artery disease)    a. s/p CABG 1996. b. low risk nuc 2011. c. LHC 04/2016 due to drop in EF -> occluded native LAD,  widely patent sequential LIMA-D1-LAD.   Chronic kidney disease    "mild stage of kidney disease"   Chronic systolic CHF (congestive heart failure) (HCC)    Complication of anesthesia    DM2 (diabetes mellitus, type 2) ([place])    not on any medicine for this at this time, 05/2016   Dyspnea    with exertion   GERD (gastroesophageal reflux disease)    Gout    History of blood transfusion    History of hiatal hernia    in 20s   History of pneumonia    March, 2016   HLD (hyperlipidemia)    HTN (hypertension)    Hyperthyroidism 01/21/2016   Inappropriate sinus tachycardia    LBBB (left bundle branch block)    a. 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 Dx 04/2016 - EF 25-30%, diffuse HK, elevated LVEDP, mild MR, mod LAE.   Normocytic anemia 10/17/2021   NSVT (nonsustained ventricular tachycardia) (HCC)    PAT (paroxysmal atrial tachycardia)    Pneumonia  PONV (postoperative nausea and vomiting)    "after just about every surgery I've had"   Rheumatoid arthritis ([place])    RA- hands   Seasonal allergies     Past Surgical History:  Procedure Laterality Date   ABDOMINAL HYSTERECTOMY     APPENDECTOMY     BACK SURGERY     BIOPSY  04/27/2020   Procedure: BIOPSY;  Surgeon: Daneil Dolin, MD;  [facility];  Service: Endoscopy;;  ascending colon biopsy   CARDIAC CATHETERIZATION N/A 05/01/2016   Procedure: Right/Left Heart Cath and Coronary/Graft Angiography;  Surgeon: Leonie Man, MD;  Location: [place] [facility] LAB;  Service: Cardiovascular;  Laterality: N/A;   CHOLECYSTECTOMY      COLONOSCOPY  11/2011   Dr. Magod:ext/int hemorrhoids, diverticulosis sigmoid colon and distal desc colon, mid-desc colon hyperplastic polyps.    COLONOSCOPY WITH PROPOFOL N/A 04/27/2020   Rourk: Diverticulosis, hemorrhoids, normal terminal ileum.  Random colon biopsies significant for chronic lymphocytic colitis. No repeat due to age.   CORONARY ARTERY BYPASS GRAFT  1996   2 vessels   ESOPHAGOGASTRODUODENOSCOPY  11/2011   Dr. Watt Climes: small hiatal hernia, one non-bleeding superficial gastric ulcer, medium-sized diverticulum in area of papilla   ESOPHAGOGASTRODUODENOSCOPY N/A 12/29/2015   Dr. Gala Romney: Chronic inactive gastritis, normal esophagus status post dilation, duodenal diverticula   ESOPHAGOGASTRODUODENOSCOPY (EGD) WITH PROPOFOL N/A 07/24/2019   Dr. Gala Romney: Normal esophagus status post empiric dilation, small hiatal hernia, large D2 diverticulum   ESOPHAGOGASTRODUODENOSCOPY (EGD) WITH PROPOFOL N/A 06/03/2021   Surgeon: Daneil Dolin, MD; normal esophagus, normal stomach, normal duodenum.  Abnormal hypopharyngeal mucosa bilaterally just distal to the base of the tongue (right side greater than left), overlying mucosa appeared irregular and somewhat verrucous in appearance.  Recommended ENT evaluation.   EYE SURGERY Bilateral    cataract removal   FOOT SURGERY     removal bone spur   FRACTURE SURGERY Right    arm   GIVENS CAPSULE STUDY  11/2012   Dr. Watt Climes: minimal gastritis, normal small bowel capsule   HERNIA REPAIR     hiatal hernia   ICD IMPLANT N/A 02/04/2021   Procedure: ICD IMPLANT;  Surgeon: Evans Lance, MD;  Location: [place] [facility];  Service: Cardiovascular;  Laterality: N/A;   MALONEY DILATION N/A 07/24/2019   Procedure: Venia Minks DILATION;  Surgeon: Daneil Dolin, MD;  [facility];  Service: Endoscopy;  Laterality: N/A;   MALONEY DILATION N/A 06/03/2021   Procedure: Venia Minks DILATION;  Surgeon: Daneil Dolin, MD;  [facility];  Service:  Endoscopy;  Laterality: N/A;   MAXIMUM ACCESS (MAS)POSTERIOR LUMBAR INTERBODY FUSION (PLIF) 1 LEVEL N/A 08/14/2013   Procedure:  MAXIMUM ACCESS SURGERY(MAS) POSTERIOR LUMBAR INTERBODY FUSION LUMBAR THREE-FOUR ;  Surgeon: Eustace Moore, MD;  Location: [place] [facility];  Service: Neurosurgery;  Laterality: N/A;   MAXIMUM ACCESS SURGERY(MAS) POSTERIOR LUMBAR INTERBODY FUSION LUMBAR THREE-FOUR    PTCA     RIGHT/LEFT HEART CATH AND CORONARY ANGIOGRAPHY N/A 03/26/2020   Procedure: RIGHT/LEFT HEART CATH AND CORONARY ANGIOGRAPHY;  Surgeon: Leonie Man, MD;  Location: [place] [facility];  Service: Cardiovascular;  Laterality: N/A;   SPINAL CORD STIMULATOR BATTERY EXCHANGE N/A 01/19/2021   Procedure: Spinal cord stimulator battery change;  Surgeon: Reece Agar, MD;  Location: [place];  Service: Neurosurgery;  Laterality: N/A;   SPINAL CORD STIMULATOR INSERTION N/A 06/16/2016   Procedure: LUMBAR SPINAL CORD STIMULATOR INSERTION;  Surgeon: Clydell Hakim, MD;  Location: [place] [facility];  Service: Neurosurgery;  Laterality: N/A;  LUMBAR SPINAL CORD STIMULATOR INSERTION   tens  05/2016   TONSILLECTOMY       {Home Medications (Optional):21181}  Inpatient Medications: Scheduled Meds:  aspirin EC  81 mg Oral Daily   atorvastatin  80 mg Oral Daily   carvedilol  6.25 mg Oral BID WC   Chlorhexidine Gluconate Cloth  6 each Topical Daily   enoxaparin (LOVENOX) injection  40 mg Subcutaneous Q24H   furosemide  40 mg Oral BID   gabapentin  300 mg Oral TID   levofloxacin  750 mg Oral Daily   omega-3 acid ethyl esters  1 g Oral BID   mouth rinse  15 mL Mouth Rinse 4 times per day   potassium chloride  20 mEq Oral Daily   Continuous Infusions:  meropenem (MERREM) IV 1 g (01/30/23 0541)   PRN Meds: acetaminophen **OR** acetaminophen, ondansetron **OR** ondansetron (ZOFRAN) IV, mouth rinse, oxyCODONE  Allergies:    Allergies  Allergen Reactions   Biaxin [Clarithromycin] Rash and Other (See Comments)     Blisters in mouth    Penicillins Rash and Other (See Comments)    Blisters in mouth Has patient had a PCN reaction causing immediate rash, facial/tongue/throat swelling, SOB or lightheadedness with hypotension: Yesyes Has patient had a PCN reaction causing severe rash involving mucus membranes or skin necrosis: Nono Has patient had a PCN reaction that required hospitalization Nono Has patient had a PCN reaction occurring within the last 10 years: Nono If all of the above answers are "NO", then may proceed   Hydroxychloroquine Other (See Comments)   Sulfamethoxazole     Other Reaction(s): Other (See Comments)   Sulfasalazine Other (See Comments)   Losartan Potassium     Other reaction(s): elevated creatinine    Social History:   Social History   Socioeconomic History   Marital status: Married    Spouse name: Not on file   Number of children: 3   Years of education: Not on file   Highest education level: Not on file  Occupational History   Not on file  Tobacco Use   Smoking status: Former    Packs/day: 0.75    Years: 15.00    Total pack years: 11.25    Types: Cigarettes    Quit date: 11/20/1994    Years since quitting: 28.2   Smokeless tobacco: Never   Tobacco comments:    Quit in [place] Use   Vaping Use: Never used  Substance and Sexual Activity   Alcohol use: Never    Alcohol/week: 0.0 standard drinks of alcohol   Drug use: Never   Sexual activity: Yes  Other Topics Concern   Not on file  Social History Narrative   2 living children, one deceased age 96   Right handed   One story home   Drinks coffee am   Social Determinants of Health   Financial Resource Strain: Not on file  Food Insecurity: No Food Insecurity (01/28/2023)   Hunger Vital Sign    Worried About Running Out of Food in the Last Year: Never true    Ran Out of Food in the Last Year: Never true  Transportation Needs: No Transportation Needs (01/28/2023)   PRAPARE - Radiographer, therapeutic (Medical): No    Lack of Transportation (Non-Medical): No  Physical Activity: Not on file  Stress: Not on file  Social Connections: Not on file  Intimate  Partner Violence: Not At Risk (01/28/2023)   Humiliation, Afraid, Rape, and Kick questionnaire    Fear of Current or Ex-Partner: No    Emotionally Abused: No    Physically Abused: No    Sexually Abused: No    Family History:   *** Family History  Problem Relation Age of Onset   Heart attack Father    Heart attack Mother    Bladder Cancer Brother    Cervical cancer Daughter        ?   Colon cancer Neg Hx      ROS:  Please see the history of present illness.  *** All other ROS reviewed and negative.     Physical Exam/Data:   Vitals:   01/29/23 1512 01/29/23 2014 01/29/23 2350 01/30/23 0522  BP: (!) 123/56 135/66 125/65 131/64  Pulse: 89 (!) 105 95 (!) 110  Resp: '20 20 18 20  '$ Temp: 98.6 F (37 C) 98.5 F (36.9 C) 98.4 F (36.9 C) 98.5 F (36.9 C)  TempSrc:   Oral   SpO2: 91% 95% 97% 92%  Weight:      Height:        Intake/Output Summary (Last 24 hours) at 01/30/2023 1104 Last data filed at 01/30/2023 0500 Gross per 24 hour  Intake 480 ml  Output 1050 ml  Net -570 ml      01/28/2023    2:22 PM 01/15/2023    9:34 AM 01/03/2023    8:09 AM  Last 3 Weights  Weight (lbs) 133 lb 13.1 oz 131 lb 4.8 oz 132 lb 3.2 oz  Weight (kg) 60.7 kg 59.557 kg 59.966 kg     Body mass index is 24.09 kg/m.  General:  Well nourished, well developed, in no acute distress*** HEENT: normal Neck: no JVD Vascular: No carotid bruits; Distal pulses 2+ bilaterally Cardiac:  normal S1, S2; RRR; no murmur *** Lungs:  clear to auscultation bilaterally, no wheezing, rhonchi or rales  Abd: soft, nontender, no hepatomegaly  Ext: no edema Musculoskeletal:  No deformities, BUE and BLE strength normal and equal Skin: warm and dry  Neuro:  CNs 2-12 intact, no focal abnormalities noted Psych:  Normal affect   EKG:  The EKG was  personally reviewed and demonstrates:  *** Telemetry:  Telemetry was personally reviewed and demonstrates:  ***  Relevant CV Studies: ***  Laboratory Data:  High Sensitivity Troponin:   Recent Labs  Lab 01/28/23 0658  TROPONINIHS 169*     Chemistry Recent Labs  Lab 01/28/23 0700 01/29/23 0310 01/30/23 0355  NA 136 138 135  K 5.0 4.5 3.5  CL 105 109 101  CO2 18* 21* 25  GLUCOSE 277* 83 125*  BUN '21 22 20  '$ CREATININE 0.95 0.82 0.87  CALCIUM 8.8* 8.2* 8.1*  MG  --   --  1.6*  GFRNONAA >60 >60 >60  ANIONGAP '13 8 9    '$ Recent Labs  Lab 01/28/23 0700 01/30/23 0355  PROT 7.3 6.1*  ALBUMIN 3.6 2.7*  AST 28 17  ALT 26 18  ALKPHOS 108 85  BILITOT 0.9 0.5   Lipids No results for input(s): "CHOL", "TRIG", "HDL", "LABVLDL", "LDLCALC", "CHOLHDL" in the last 168 hours.  Hematology Recent Labs  Lab 01/28/23 0658 01/29/23 0310 01/30/23 0355  WBC 15.1* 16.1* 15.8*  RBC 3.64* 2.89* 2.89*  HGB 11.6* 9.2* 9.0*  HCT 36.9 31.0* 29.9*  MCV 101.4* 107.3* 103.5*  MCH 31.9 31.8 31.1  MCHC 31.4 29.7* 30.1  RDW 15.6* 15.7*  15.7*  PLT 214 142* 163   Thyroid No results for input(s): "TSH", "FREET4" in the last 168 hours.  BNP Recent Labs  Lab 01/28/23 0710 01/29/23 0310  BNP 503.0* 1,670.0*    DDimer No results for input(s): "DDIMER" in the last 168 hours.   Radiology/Studies:  DG CHEST PORT 1 VIEW  Result Date: 01/30/2023 CLINICAL DATA:  Pneumonia, CHF, pulmonary edema, COPD, open heart surgery EXAM: PORTABLE CHEST 1 VIEW COMPARISON:  Portable exam 0811 hours compared to 01/28/2023 FINDINGS: Intraspinal stimulator and LEFT subclavian ICD unchanged. Enlargement of cardiac silhouette post median sternotomy. Atherosclerotic calcification aorta. Pulmonary vascular congestion with asymmetric pulmonary infiltrates greater on LEFT. This likely represents a combination of mild pulmonary edema and superimposed LEFT lung consolidation/pneumonia. Minimal LEFT pleural effusion without  pneumothorax. Bones demineralized. IMPRESSION: Enlargement of cardiac silhouette with probable pulmonary edema and superimposed LEFT lung consolidation/pneumonia. Aortic Atherosclerosis (ICD10-I70.0). Electronically Signed   By: Lavonia Dana M.D.   On: 01/30/2023 08:26   DG Chest Port 1 View  Result Date: 01/28/2023 CLINICAL DATA:  Shortness of breath. EXAM: PORTABLE CHEST 1 VIEW COMPARISON:  PA Lat chest 10/04/2022, CTA chest 10/05/2022 FINDINGS: 7:12 a.m. There are partially translucent electrical pads overlying the left mid to lower lung field. Left-sided single lead pacer/AID and wire insertion are unaltered. 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 Stable mild cardiomegaly.  The central vessels are normal caliber. There is patchy consolidation in the left lower lung field and scattered interstitial and airspace opacities of the right mid and lower lung, findings consistent with multilobar pneumonia. There is no substantial pleural effusion. The mediastinum is normally outlined. There is aortic calcific plaque. Osteopenia and mild thoracic dextroscoliosis. IMPRESSION: Patchy consolidation in the left lower lung field and scattered interstitial and airspace opacities of the right mid and lower lung field, findings consistent with multilobar pneumonia. Clinical correlation and radiographic follow-up recommended. Electrical pads overlying the left chest. Electronically Signed   By: Telford Nab M.D.   On: 01/28/2023 07:31     Assessment and Plan:   ***   Risk Assessment/Risk Scores:  {Complete the following score calculators/questions to meet required metrics.  Press F2         :[id number]   {Is the patient being seen for unstable angina, ACS, NSTEMI or STEMI?:[phone]} {Does this patient have CHF or CHF symptoms?      :[id number] {Does this patient have ATRIAL FIBRILLATION?:[phone]}  {Are we signing off today?:[id number]}  For questions or updates, please contact [place] Please consult  [email or web address] for contact info under    Signed, Stanford Breed, Medical Student  01/30/2023 11:04 AM

## 2023-01-30 NOTE — Consult Note (Signed)
CARDIOLOGY CONSULT NOTE    Patient ID: Katherine Larson; [id number]; [birth year]   Admit date: 01/28/2023 Date of Consult: 01/30/2023  Primary Care Provider: Harlan Stains, MD Primary Cardiologist:  Primary Electrophysiologist:     Patient Profile:   Katherine Larson is a 77 y.o. female with a hx of CAD status post CABG, ICM LVEF 30 to 35% status post [place] single-chamber ICD, LBBB, lymphocytic colitis who is being seen today for the evaluation of chest pain and bacteremia at the request of Dr. Carles Collet.  History of Present Illness:   Katherine Larson is a 77 year old F known to have CAD status post CABG in 1996, ICM LVEF 30-35% s/p [place] single-chamber ICD, LBBB, lymphocytic colitis presented to ER with SOB and chest pain since 1 day prior to presentation. There was imaging evidence of multilobular opacities consistent with pneumonia and blood cultures positive for Pseudomonas aeruginosa.  Patient is active at baseline and has no symptoms of angina until 1 day prior to presentation when she had acute development of shortness of breath and chest pain. Otherwise she was independent at home, can perform her household chores with no symptoms of angina.  Has no symptoms of dizziness, lightheadedness, syncope, leg swelling and palpitations.  Currently she is managed for pneumonia and bacteremia with IV antibiotics.  Cardiology was consulted for chest pain and possibility of TEE due to bacteremia and presence of single-chamber ICD.          She was diagnosed with COVID-19 infection recently in 12/2022  Past Medical History:  Diagnosis Date   Anemia    anemia of chronic disease +/- IDA followed by Dr. Earlie Server. received iron infusions in the past   Asthma    Borderline glaucoma    CAD (coronary artery disease)    a. s/p CABG 1996. b. low risk nuc 2011. c. LHC 04/2016 due to drop in EF -> occluded native LAD,  widely patent sequential LIMA-D1-LAD.   Chronic kidney disease    "mild  stage of kidney disease"   Chronic systolic CHF (congestive heart failure) (HCC)    Complication of anesthesia    DM2 (diabetes mellitus, type 2) ([place])    not on any medicine for this at this time, 05/2016   Dyspnea    with exertion   GERD (gastroesophageal reflux disease)    Gout    History of blood transfusion    History of hiatal hernia    in 20s   History of pneumonia    March, 2016   HLD (hyperlipidemia)    HTN (hypertension)    Hyperthyroidism 01/21/2016   Inappropriate sinus tachycardia    LBBB (left bundle branch block)    a. Seen in 04/2016   Lymphocytic colitis 04/2020   Macular degeneration    left   Myocardial infarction [facility])    1996   Neuropathy    Neuropathy    NICM (nonischemic cardiomyopathy) ([place])    a. Dx 04/2016 - EF 25-30%, diffuse HK, elevated LVEDP, mild MR, mod LAE.   Normocytic anemia 10/17/2021   NSVT (nonsustained ventricular tachycardia) (HCC)    PAT (paroxysmal atrial tachycardia)    Pneumonia    PONV (postoperative nausea and vomiting)    "after just about every surgery I've had"   Rheumatoid arthritis ([place])    RA- hands   Seasonal allergies     Past Surgical History:  Procedure Laterality Date   ABDOMINAL HYSTERECTOMY     APPENDECTOMY  BACK SURGERY     BIOPSY  04/27/2020   Procedure: BIOPSY;  Surgeon: Daneil Dolin, MD;  [facility];  Service: Endoscopy;;  ascending colon biopsy   CARDIAC CATHETERIZATION N/A 05/01/2016   Procedure: Right/Left Heart Cath and Coronary/Graft Angiography;  Surgeon: Leonie Man, MD;  Location: [place] [facility];  Service: Cardiovascular;  Laterality: N/A;   CHOLECYSTECTOMY     COLONOSCOPY  11/2011   Dr. Magod:ext/int hemorrhoids, diverticulosis sigmoid colon and distal desc colon, mid-desc colon hyperplastic polyps.    COLONOSCOPY WITH PROPOFOL N/A 04/27/2020   Rourk: Diverticulosis, hemorrhoids, normal terminal ileum.  Random colon biopsies significant for chronic lymphocytic colitis. No  repeat due to age.   CORONARY ARTERY BYPASS GRAFT  1996   2 vessels   ESOPHAGOGASTRODUODENOSCOPY  11/2011   Dr. Watt Climes: small hiatal hernia, one non-bleeding superficial gastric ulcer, medium-sized diverticulum in area of papilla   ESOPHAGOGASTRODUODENOSCOPY N/A 12/29/2015   Dr. Gala Romney: Chronic inactive gastritis, normal esophagus status post dilation, duodenal diverticula   ESOPHAGOGASTRODUODENOSCOPY (EGD) WITH PROPOFOL N/A 07/24/2019   Dr. Gala Romney: Normal esophagus status post empiric dilation, small hiatal hernia, large D2 diverticulum   ESOPHAGOGASTRODUODENOSCOPY (EGD) WITH PROPOFOL N/A 06/03/2021   Surgeon: Daneil Dolin, MD; normal esophagus, normal stomach, normal duodenum.  Abnormal hypopharyngeal mucosa bilaterally just distal to the base of the tongue (right side greater than left), overlying mucosa appeared irregular and somewhat verrucous in appearance.  Recommended ENT evaluation.   EYE SURGERY Bilateral    cataract removal   FOOT SURGERY     removal bone spur   FRACTURE SURGERY Right    arm   GIVENS CAPSULE STUDY  11/2012   Dr. Watt Climes: minimal gastritis, normal small bowel capsule   HERNIA REPAIR     hiatal hernia   ICD IMPLANT N/A 02/04/2021   Procedure: ICD IMPLANT;  Surgeon: Evans Lance, MD;  Location: [place] [facility];  Service: Cardiovascular;  Laterality: N/A;   MALONEY DILATION N/A 07/24/2019   Procedure: Venia Minks DILATION;  Surgeon: Daneil Dolin, MD;  [facility];  Service: Endoscopy;  Laterality: N/A;   MALONEY DILATION N/A 06/03/2021   Procedure: Venia Minks DILATION;  Surgeon: Daneil Dolin, MD;  [facility];  Service: Endoscopy;  Laterality: N/A;   MAXIMUM ACCESS (MAS)POSTERIOR LUMBAR INTERBODY FUSION (PLIF) 1 LEVEL N/A 08/14/2013   Procedure:  MAXIMUM ACCESS SURGERY(MAS) POSTERIOR LUMBAR INTERBODY FUSION LUMBAR THREE-FOUR ;  Surgeon: Eustace Moore, MD;  Location: [place] [facility];  Service: Neurosurgery;  Laterality: N/A;   MAXIMUM ACCESS  SURGERY(MAS) POSTERIOR LUMBAR INTERBODY FUSION LUMBAR THREE-FOUR    PTCA     RIGHT/LEFT HEART CATH AND CORONARY ANGIOGRAPHY N/A 03/26/2020   Procedure: RIGHT/LEFT HEART CATH AND CORONARY ANGIOGRAPHY;  Surgeon: Leonie Man, MD;  Location: [place] [facility];  Service: Cardiovascular;  Laterality: N/A;   SPINAL CORD STIMULATOR BATTERY EXCHANGE N/A 01/19/2021   Procedure: Spinal cord stimulator battery change;  Surgeon: Reece Agar, MD;  Location: [place];  Service: Neurosurgery;  Laterality: N/A;   SPINAL CORD STIMULATOR INSERTION N/A 06/16/2016   Procedure: LUMBAR SPINAL CORD STIMULATOR INSERTION;  Surgeon: Clydell Hakim, MD;  Location: [place] [facility];  Service: Neurosurgery;  Laterality: N/A;  LUMBAR SPINAL CORD STIMULATOR INSERTION   tens  05/2016   TONSILLECTOMY         Inpatient Medications: Scheduled Meds:  aspirin EC  81 mg Oral Daily   atorvastatin  80 mg Oral Daily  carvedilol  6.25 mg Oral BID WC   Chlorhexidine Gluconate Cloth  6 each Topical Daily   enoxaparin (LOVENOX) injection  40 mg Subcutaneous Q24H   furosemide  40 mg Oral BID   gabapentin  300 mg Oral TID   omega-3 acid ethyl esters  1 g Oral BID   mouth rinse  15 mL Mouth Rinse 4 times per day   potassium chloride  20 mEq Oral Daily   Continuous Infusions:  ceFEPime (MAXIPIME) IV 2 g (01/30/23 1420)   PRN Meds: acetaminophen **OR** acetaminophen, ondansetron **OR** ondansetron (ZOFRAN) IV, mouth rinse, oxyCODONE  Allergies:    Allergies  Allergen Reactions   Biaxin [Clarithromycin] Rash and Other (See Comments)    Blisters in mouth    Penicillins Rash and Other (See Comments)    Blisters in mouth Has patient had a PCN reaction causing immediate rash, facial/tongue/throat swelling, SOB or lightheadedness with hypotension: Yesyes Has patient had a PCN reaction causing severe rash involving mucus membranes or skin necrosis: Nono Has patient had a PCN reaction that required hospitalization Nono Has  patient had a PCN reaction occurring within the last 10 years: Nono If all of the above answers are "NO", then may proceed   Hydroxychloroquine Other (See Comments)   Sulfamethoxazole     Other Reaction(s): Other (See Comments)   Sulfasalazine Other (See Comments)   Losartan Potassium     Other reaction(s): elevated creatinine    Social History:   Social History   Socioeconomic History   Marital status: Married    Spouse name: Not on file   Number of children: 3   Years of education: Not on file   Highest education level: Not on file  Occupational History   Not on file  Tobacco Use   Smoking status: Former    Packs/day: 0.75    Years: 15.00    Total pack years: 11.25    Types: Cigarettes    Quit date: 11/20/1994    Years since quitting: 28.2   Smokeless tobacco: Never   Tobacco comments:    Quit in [place] Use   Vaping Use: Never used  Substance and Sexual Activity   Alcohol use: Never    Alcohol/week: 0.0 standard drinks of alcohol   Drug use: Never   Sexual activity: Yes  Other Topics Concern   Not on file  Social History Narrative   2 living children, one deceased age 48   Right handed   One story home   Drinks coffee am   Social Determinants of Health   Financial Resource Strain: Not on file  Food Insecurity: No Food Insecurity (01/28/2023)   Hunger Vital Sign    Worried About Running Out of Food in the Last Year: Never true    Ran Out of Food in the Last Year: Never true  Transportation Needs: No Transportation Needs (01/28/2023)   PRAPARE - Hydrologist (Medical): No    Lack of Transportation (Non-Medical): No  Physical Activity: Not on file  Stress: Not on file  Social Connections: Not on file  Intimate Partner Violence: Not At Risk (01/28/2023)   Humiliation, Afraid, Rape, and Kick questionnaire    Fear of Current or Ex-Partner: No    Emotionally Abused: No    Physically Abused: No    Sexually Abused: No    Family  History:    Family History  Problem Relation Age of Onset   Heart attack Father  Heart attack Mother    Bladder Cancer Brother    Cervical cancer Daughter        ?   Colon cancer Neg Hx      ROS:  Please see the history of present illness.  ROS  All other ROS reviewed and negative.     Physical Exam/Data:   Vitals:   01/29/23 2014 01/29/23 2350 01/30/23 0522 01/30/23 1313  BP: 135/66 125/65 131/64 128/62  Pulse: (!) 105 95 (!) 110 93  Resp: '20 18 20 16  '$ Temp: 98.5 F (36.9 C) 98.4 F (36.9 C) 98.5 F (36.9 C) 99.4 F (37.4 C)  TempSrc:  Oral  Oral  SpO2: 95% 97% 92% 96%  Weight:      Height:        Intake/Output Summary (Last 24 hours) at 01/30/2023 1516 Last data filed at 01/30/2023 0500 Gross per 24 hour  Intake 480 ml  Output 200 ml  Net 280 ml   Filed Weights   01/28/23 1422  Weight: 60.7 kg   Body mass index is 24.09 kg/m.  General:  Well nourished, well developed, in no acute distress HEENT: normal Lymph: no adenopathy Neck: no JVD Endocrine:  No thryomegaly Vascular: No carotid bruits; FA pulses 2+ bilaterally without bruits  Cardiac:  normal S1, S2; RRR; no murmur  Lungs:  clear to auscultation bilaterally, no wheezing, rhonchi or rales  Abd: soft, nontender, no hepatomegaly  Ext: no edema Musculoskeletal:  No deformities, BUE and BLE strength normal and equal Skin: warm and dry  Neuro:  CNs 2-12 intact, no focal abnormalities noted Psych:  Normal affect   EKG:  The EKG was personally reviewed and demonstrates: LBBB, underlying rhythm was unable to be determined due to tachycardia. Telemetry:  Telemetry was personally reviewed and demonstrates: Rates controlled, underlying LBBB and HR 90s.  Relevant CV Studies:  Laboratory Data:  Chemistry Recent Labs  Lab 01/28/23 0700 01/29/23 0310 01/30/23 0355  NA 136 138 135  K 5.0 4.5 3.5  CL 105 109 101  CO2 18* 21* 25  GLUCOSE 277* 83 125*  BUN '21 22 20  '$ CREATININE 0.95 0.82 0.87   CALCIUM 8.8* 8.2* 8.1*  GFRNONAA >60 >60 >60  ANIONGAP '13 8 9    '$ Recent Labs  Lab 01/28/23 0700 01/30/23 0355  PROT 7.3 6.1*  ALBUMIN 3.6 2.7*  AST 28 17  ALT 26 18  ALKPHOS 108 85  BILITOT 0.9 0.5   Hematology Recent Labs  Lab 01/28/23 0658 01/29/23 0310 01/30/23 0355  WBC 15.1* 16.1* 15.8*  RBC 3.64* 2.89* 2.89*  HGB 11.6* 9.2* 9.0*  HCT 36.9 31.0* 29.9*  MCV 101.4* 107.3* 103.5*  MCH 31.9 31.8 31.1  MCHC 31.4 29.7* 30.1  RDW 15.6* 15.7* 15.7*  PLT 214 142* 163   Cardiac EnzymesNo results for input(s): "TROPONINI" in the last 168 hours. No results for input(s): "TROPIPOC" in the last 168 hours.  BNP Recent Labs  Lab 01/28/23 0710 01/29/23 0310  BNP 503.0* 1,670.0*    DDimer No results for input(s): "DDIMER" in the last 168 hours.  Radiology/Studies:  DG CHEST PORT 1 VIEW  Result Date: 01/30/2023 CLINICAL DATA:  Pneumonia, CHF, pulmonary edema, COPD, open heart surgery EXAM: PORTABLE CHEST 1 VIEW COMPARISON:  Portable exam 0811 hours compared to 01/28/2023 FINDINGS: Intraspinal stimulator and LEFT subclavian ICD unchanged. Enlargement of cardiac silhouette post median sternotomy. Atherosclerotic calcification aorta. Pulmonary vascular congestion with asymmetric pulmonary infiltrates greater on LEFT. This likely represents a combination  of mild pulmonary edema and superimposed LEFT lung consolidation/pneumonia. Minimal LEFT pleural effusion without pneumothorax. Bones demineralized. IMPRESSION: Enlargement of cardiac silhouette with probable pulmonary edema and superimposed LEFT lung consolidation/pneumonia. Aortic Atherosclerosis (ICD10-I70.0). Electronically Signed   By: Lavonia Dana M.D.   On: 01/30/2023 08:26   DG Chest Port 1 View  Result Date: 01/28/2023 CLINICAL DATA:  Shortness of breath. EXAM: PORTABLE CHEST 1 VIEW COMPARISON:  PA Lat chest 10/04/2022, CTA chest 10/05/2022 FINDINGS: 7:12 a.m. There are partially translucent electrical pads overlying the left  mid to lower lung field. Left-sided single lead pacer/AID and wire insertion are unaltered. 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 Stable mild cardiomegaly.  The central vessels are normal caliber. There is patchy consolidation in the left lower lung field and scattered interstitial and airspace opacities of the right mid and lower lung, findings consistent with multilobar pneumonia. There is no substantial pleural effusion. The mediastinum is normally outlined. There is aortic calcific plaque. Osteopenia and mild thoracic dextroscoliosis. IMPRESSION: Patchy consolidation in the left lower lung field and scattered interstitial and airspace opacities of the right mid and lower lung field, findings consistent with multilobar pneumonia. Clinical correlation and radiographic follow-up recommended. Electrical pads overlying the left chest. Electronically Signed   By: Telford Nab M.D.   On: 01/28/2023 07:31    Assessment and Plan:   Patient is a 77 year old F known to have CAD status post CABG, ICM LVEF 30 to 35% status post [place] single-chamber ICD, LBBB, lymphocytic colitis is currently admitted to the hospitalist team for the management of pneumonia and Pseudomonas aeruginosa bacteremia with IV antibiotics.  # Non-cardiac chest pain -No further intervention  # Tachycardia on admission multifactorial in the setting of pneumonia and bacteremia, has underlying LBBB, unlikely to be VT.  # Pseudomonas aeruginosa bacteremia -Obtain TTE to rule out infective endocarditis  # CAD status post CABG -Continue aspirin 81 mg once daily and Lipitor 80 mg nightly.  # ICM LVEF 30 to 35% status post ICD -Continue GDMT with Lasix 40 mg once daily, carvedilol 9.375 mg twice daily, losartan 100 mg once daily, Farxiga 10 mg once daily. -EKG shows LBBB, was evaluated by EP and did not think she will benefit from CRT-D upgrade.  I have spent a total of 60 minutes with patient reviewing chart , telemetry, EKGs, labs and examining  patient as well as establishing an assessment and plan that was discussed with the patient.  > 50% of time was spent in direct patient care.      For questions or updates, please contact [place] Please consult [email or web address] for contact info under Cardiology/STEMI.   Signed, Vangie Bicker, MD 01/30/2023 3:16 PM

## 2023-01-30 NOTE — Progress Notes (Signed)
PHARMACY NOTE:  ANTIMICROBIAL RENAL DOSAGE ADJUSTMENT  Current antimicrobial regimen includes a mismatch between antimicrobial dosage and estimated renal function.  As per policy approved by the Pharmacy & Therapeutics and Medical Executive Committees, the antimicrobial dosage will be adjusted accordingly.  Current antimicrobial dosage:  Cefepime 2g IV every 8 hours  Indication: Pseudomonas bacteremia  Renal Function:  Estimated Creatinine Clearance: 45.2 mL/min (by C-G formula based on SCr of 0.87 mg/dL). '[]'$      On intermittent HD, scheduled: '[]'$      On CRRT    Antimicrobial dosage has been changed to:   Cefepime 2g IV every 12 hours  Additional comments:   Thank you for allowing pharmacy to be a part of this patient's care.  Alycia Rossetti, PharmD, BCPS Infectious Diseases Clinical Pharmacist 01/30/2023 1:47 PM   **Pharmacist phone directory can now be found on [email or web address] ([email or web address]).  Listed under [place].

## 2023-01-30 NOTE — Consult Note (Signed)
[facility]                                                                                        Patient Identification: Patient Name: Katherine Larson MRN: [id number] [place] Date: 01/28/2023  6:59 AM Today's Date: 01/30/2023 Reason for consult: Bacteremia Requesting provider: Dr. Juleen China  Principal Problem:   Acute respiratory failure with hypoxia [facility]) Active Problems:   Sepsis due to undetermined organism [facility])   LBBB (left bundle branch block)   Chronic HFrEF (heart failure with reduced ejection fraction) ([place])   Lobar pneumonia (HCC)   Antibiotics:  Vancomycin 3/10 Aztreonam 3/10 Levofloxacin 3/10, meropenem 3/11-c  Lines/Hardware: ICD and spinal cord stimulator  Assessment # PsA bacteremia likely 2/2  # Multifocal pna with acute hypoxic respiratory failure  # Recent COVID - unlikely to be rebound almost after a month # Acute on chronic CHFrEF s/p ICD # Spinal cord stimulator - no issues reported   Recommendations  Recommend to switch to cefepime to cover PsA bacteremia, has received ceftin in 08/2022 without reported adverse events  Repeat blood cx ordered for clearance Cardiology consulted, planned for TEE to r/o vegetations/ICD involvement  Monitor CBC and BMP  D/w ID Pharm D and primary  Following   Rest of the management as per the primary team. Please call with questions or concerns.  Thank you for the consult  Rosiland Oz, MD Infectious Disease Physician [facility] [address] [place], [place] [address] Phone: [phone]  Fax: [phone]  __________________________________________________________________________________________________________ HPI and Hospital Course: 77 year old female with PMH of RA, h/o PLIF, spinal cord stimulator, CHFrEF s/p ICD as well as recent COVID 2/19 s/p 5 days of  paxlovid who was brought from home via EMS for 1 day of acute onset shortness of breath, SpO2 88% on room air at EMS arrival.  Was given nitro for elevated BP. At ED arrival she was on nonrebreather with 95% with retractions. Also had single episode of nausea and vomiting.   At ED, T max 100 Lab remarkable for leukocytosis, elevated LA as well as elevated BNP Chest xray 3/10 are s/o multifocal pna  Blood cx 3/10 1/2 sets GNR, BCID as PsA  ROS: not available, remote consult   Past Medical History:  Diagnosis Date   Anemia    anemia of chronic disease +/- IDA followed by Dr. Earlie Server. received iron infusions in the past   Asthma    Borderline glaucoma    CAD (coronary artery disease)    a. s/p CABG 1996. b. low risk nuc 2011. c. LHC 04/2016 due to drop in EF -> occluded native LAD,  widely patent sequential LIMA-D1-LAD.   Chronic kidney disease    "mild stage of kidney disease"   Chronic systolic CHF (congestive heart failure) (HCC)    Complication of anesthesia    DM2 (diabetes mellitus, type 2) ([place])    not on any medicine for this at this time, 05/2016   Dyspnea    with exertion   GERD (gastroesophageal reflux disease)    Gout    History  of blood transfusion    History of hiatal hernia    in 20s   History of pneumonia    March, 2016   HLD (hyperlipidemia)    HTN (hypertension)    Hyperthyroidism 01/21/2016   Inappropriate sinus tachycardia    LBBB (left bundle branch block)    a. Seen in 04/2016   Lymphocytic colitis 04/2020   Macular degeneration    left   Myocardial infarction [facility])    1996   Neuropathy    Neuropathy    NICM (nonischemic cardiomyopathy) ([place])    a. Dx 04/2016 - EF 25-30%, diffuse HK, elevated LVEDP, mild MR, mod LAE.   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 Magod:ext/int hemorrhoids, diverticulosis sigmoid colon and distal desc colon, mid-desc colon hyperplastic polyps.    COLONOSCOPY WITH PROPOFOL N/A 04/27/2020   Rourk: Diverticulosis, hemorrhoids, normal terminal ileum.  Random colon biopsies significant for chronic lymphocytic colitis. No repeat due to age.   CORONARY ARTERY BYPASS GRAFT  1996   2 vessels   ESOPHAGOGASTRODUODENOSCOPY  11/2011   Dr. Watt Climes: small hiatal hernia, one non-bleeding superficial gastric ulcer, medium-sized diverticulum in area of papilla   ESOPHAGOGASTRODUODENOSCOPY N/A 12/29/2015   Dr. Gala Romney: Chronic inactive gastritis, normal esophagus status post dilation, duodenal diverticula   ESOPHAGOGASTRODUODENOSCOPY (EGD) WITH PROPOFOL N/A 07/24/2019   Dr. Gala Romney: Normal esophagus status post empiric dilation, small hiatal hernia, large D2 diverticulum   ESOPHAGOGASTRODUODENOSCOPY (EGD) WITH PROPOFOL N/A 06/03/2021   Surgeon: Daneil Dolin, MD; normal esophagus, normal stomach, normal duodenum.  Abnormal hypopharyngeal mucosa bilaterally just distal to the base of the tongue (right side greater than left), overlying mucosa appeared irregular and somewhat verrucous in appearance.  Recommended ENT evaluation.   EYE SURGERY Bilateral    cataract removal   FOOT SURGERY     removal bone spur   FRACTURE SURGERY Right    arm   GIVENS CAPSULE STUDY  11/2012    Dr. Watt Climes: minimal gastritis, normal small bowel capsule   HERNIA REPAIR     hiatal hernia   ICD IMPLANT N/A 02/04/2021   Procedure: ICD IMPLANT;  Surgeon: Evans Lance, MD;  Location: [place] CV LAB;  Service: Cardiovascular;  Laterality: N/A;   MALONEY DILATION N/A 07/24/2019   Procedure: Venia Minks DILATION;  Surgeon: Daneil Dolin, MD;  [facility];  Service: Endoscopy;  Laterality: N/A;   MALONEY DILATION N/A 06/03/2021   Procedure: Venia Minks DILATION;  Surgeon: Daneil Dolin, MD;  [facility];  Service: Endoscopy;  Laterality: N/A;   MAXIMUM ACCESS (MAS)POSTERIOR LUMBAR INTERBODY FUSION (PLIF) 1 LEVEL N/A 08/14/2013   Procedure:  MAXIMUM ACCESS SURGERY(MAS) POSTERIOR LUMBAR INTERBODY FUSION LUMBAR THREE-FOUR ;  Surgeon: Eustace Moore, MD;  Location: [place] [facility] OR[facility];  Service: Neurosurgery;  Laterality: N/A;   MAXIMUM ACCESS SURGERY(MAS) POSTERIOR LUMBAR INTERBODY FUSION LUMBAR THREE-FOUR    PTCA     RIGHT/LEFT HEART CATH AND CORONARY ANGIOGRAPHY N/A 03/26/2020   Procedure: RIGHT/LEFT HEART CATH AND CORONARY ANGIOGRAPHY;  Surgeon: Leonie Man, MD;  Location: [place] CV LAB;  Service: Cardiovascular;  Laterality: N/A;   SPINAL CORD STIMULATOR BATTERY EXCHANGE N/A 01/19/2021   Procedure: Spinal cord stimulator battery change;  Surgeon: Reece Agar, MD;  Location: [place];  Service: Neurosurgery;  Laterality: N/A;   SPINAL CORD STIMULATOR INSERTION N/A 06/16/2016   Procedure: LUMBAR SPINAL CORD STIMULATOR INSERTION;  Surgeon: Clydell Hakim, MD;  Location: [place] NEURO ORS;  Service: Neurosurgery;  Laterality: N/A;  LUMBAR SPINAL CORD STIMULATOR INSERTION   tens  05/2016   TONSILLECTOMY       Scheduled Meds:  aspirin EC  81 mg Oral Daily   atorvastatin  80 mg Oral Daily   carvedilol  3.125 mg Oral BID WC   Chlorhexidine Gluconate Cloth  6 each Topical Daily   enoxaparin (LOVENOX) injection  40 mg Subcutaneous Q24H   furosemide  40 mg Oral BID    gabapentin  300 mg Oral TID   leflunomide  20 mg Oral Daily   omega-3 acid ethyl esters  1 g Oral BID   mouth rinse  15 mL Mouth Rinse 4 times per day   potassium chloride  20 mEq Oral Daily   Continuous Infusions:  magnesium sulfate bolus IVPB     meropenem (MERREM) IV 1 g (01/30/23 0541)   PRN Meds:.acetaminophen **OR** acetaminophen, ondansetron **OR** ondansetron (ZOFRAN) IV, mouth rinse, oxyCODONE  Allergies  Allergen Reactions   Biaxin [Clarithromycin] Rash and Other (See Comments)    Blisters in mouth    Penicillins Rash and Other (See Comments)    Blisters in mouth Has patient had a PCN reaction causing immediate rash, facial/tongue/throat swelling, SOB or lightheadedness with hypotension: Yesyes Has patient had a PCN reaction causing severe rash involving mucus membranes or skin necrosis: Nono Has patient had a PCN reaction that required hospitalization Nono Has patient had a PCN reaction occurring within the last 10 years: Nono If all of the above answers are "NO", then may proceed   Hydroxychloroquine Other (See Comments)   Sulfamethoxazole     Other Reaction(s): Other (See Comments)   Sulfasalazine Other (See Comments)   Losartan Potassium     Other reaction(s): elevated creatinine    Social History   Socioeconomic History   Marital status: Married    Spouse name: Not on file   Number of children: 3   Years of education: Not on file   Highest education level: Not on file  Occupational History   Not on file  Tobacco Use   Smoking status: Former    Packs/day: 0.75    Years: 15.00    Total pack years: 11.25    Types: Cigarettes    Quit date: 11/20/1994    Years since quitting: 28.2   Smokeless tobacco: Never   Tobacco comments:    Quit in 1996  Vaping Use   Vaping Use: Never used  Substance and Sexual Activity   Alcohol use: Never    Alcohol/week: 0.0 standard drinks of alcohol   Drug use: Never   Sexual activity: Yes  Other Topics Concern   Not on  file  Social History Narrative   2 living children, one deceased age 36   Right handed   One story home   Drinks coffee am   Social Determinants of Health   Financial Resource Strain: Not on file  Food Insecurity: No Food Insecurity (01/28/2023)   Hunger Vital Sign    Worried About Running Out of Food in the Last Year: Never true    Ran Out of Food in the Last Year: Never true  Transportation Needs: No Transportation Needs (01/28/2023)   PRAPARE - Hydrologist (Medical): No    Lack of Transportation (Non-Medical): No  Physical Activity: Not on file  Stress: Not on file  Social Connections: Not on file  Intimate Partner Violence: Not At Risk (01/28/2023)   Humiliation, Afraid, Rape, and Kick questionnaire    Fear of Current or Ex-Partner: No    Emotionally Abused: No    Physically Abused: No    Sexually Abused: No   Family History  Problem Relation Age of Onset   Heart attack Father    Heart attack Mother    Bladder Cancer Brother    Cervical cancer Daughter        ?   Colon cancer Neg Hx     Vitals BP 131/64 (BP Location: Right Arm)   Pulse (!) 110   Temp 98.5 F (36.9 C)   Resp 20   Ht 5' 2.5" (1.588 m)   Wt 60.7 kg   SpO2 92%   BMI 24.09 kg/m   Exam  Unavailable as remote consult, not seen in person  Pertinent Microbiology Results for orders placed or performed during the hospital encounter of 01/28/23  Blood culture (routine x 2)     Status: None (Preliminary result)   Collection Time: 01/28/23  6:59 AM   Specimen: BLOOD RIGHT WRIST  Result Value Ref Range Status   Specimen Description   Final    BLOOD RIGHT WRIST BOTTLES DRAWN AEROBIC AND ANAEROBIC Performed at [facility], [address], [place], [place] [address]    Special Requests   Final    Blood Culture adequate volume Performed at [facility], [address] [place], [place] [address]    Culture  Setup Time   Final    GRAM NEGATIVE RODS Gram Stain Report Called  to,Read Back By and Verified With: D.SMITH @ 1451 BY STEPHTR 01/29/23 BOTTLES DRAWN AEROBIC ONLY CRITICAL RESULT CALLED TO, READ BACK BY AND VERIFIED WITH: RN Keturah Barre [facility] [id number] '@1755'$  FH Performed at [facility], [place] [address], [place], [place] [address]    Culture GRAM NEGATIVE RODS  Final   Report Status PENDING  Incomplete  Blood Culture ID Panel (Reflexed)     Status: Abnormal   Collection Time: 01/28/23  6:59 AM  Result Value Ref Range Status   Enterococcus faecalis NOT DETECTED NOT DETECTED Final   Enterococcus Faecium NOT DETECTED NOT DETECTED Final   Listeria monocytogenes NOT DETECTED NOT DETECTED Final   Staphylococcus species NOT DETECTED NOT DETECTED Final   Staphylococcus aureus (BCID) NOT DETECTED NOT DETECTED Final   Staphylococcus epidermidis NOT DETECTED NOT DETECTED Final   Staphylococcus lugdunensis NOT DETECTED NOT DETECTED Final   Streptococcus species NOT DETECTED NOT DETECTED Final   Streptococcus agalactiae NOT DETECTED NOT DETECTED Final   Streptococcus pneumoniae NOT DETECTED NOT DETECTED Final   Streptococcus pyogenes NOT DETECTED NOT DETECTED Final   A.calcoaceticus-baumannii NOT DETECTED NOT DETECTED Final   Bacteroides  fragilis NOT DETECTED NOT DETECTED Final   Enterobacterales NOT DETECTED NOT DETECTED Final   Enterobacter cloacae complex NOT DETECTED NOT DETECTED Final   Escherichia coli NOT DETECTED NOT DETECTED Final   Klebsiella aerogenes NOT DETECTED NOT DETECTED Final   Klebsiella oxytoca NOT DETECTED NOT DETECTED Final   Klebsiella pneumoniae NOT DETECTED NOT DETECTED Final   Proteus species NOT DETECTED NOT DETECTED Final   Salmonella species NOT DETECTED NOT DETECTED Final   Serratia marcescens NOT DETECTED NOT DETECTED Final   Haemophilus influenzae NOT DETECTED NOT DETECTED Final   Neisseria meningitidis NOT DETECTED NOT DETECTED Final   Pseudomonas aeruginosa DETECTED (A) NOT DETECTED Final    Comment: CRITICAL RESULT CALLED TO, READ  BACK BY AND VERIFIED WITH: RN D. [place] [phone] '@1755'$  FH    Stenotrophomonas maltophilia NOT DETECTED NOT DETECTED Final   Candida albicans NOT DETECTED NOT DETECTED Final   Candida auris NOT DETECTED NOT DETECTED Final   Candida glabrata NOT DETECTED NOT DETECTED Final   Candida krusei NOT DETECTED NOT DETECTED Final   Candida parapsilosis NOT DETECTED NOT DETECTED Final   Candida tropicalis NOT DETECTED NOT DETECTED Final   Cryptococcus neoformans/gattii NOT DETECTED NOT DETECTED Final   CTX-M ESBL NOT DETECTED NOT DETECTED Final   Carbapenem resistance IMP NOT DETECTED NOT DETECTED Final   Carbapenem resistance KPC NOT DETECTED NOT DETECTED Final   Carbapenem resistance NDM NOT DETECTED NOT DETECTED Final   Carbapenem resistance VIM NOT DETECTED NOT DETECTED Final    Comment: Performed at [facility], [address] [place], [place] [address]  Blood culture (routine x 2)     Status: None (Preliminary result)   Collection Time: 01/28/23  7:04 AM   Specimen: BLOOD RIGHT FOREARM  Result Value Ref Range Status   Specimen Description   Final    BLOOD RIGHT FOREARM BOTTLES DRAWN AEROBIC AND ANAEROBIC   Special Requests Blood Culture adequate volume  Final   Culture   Final    NO GROWTH < 24 HOURS Performed at [facility], [address] [place], [place] [address]    Report Status PENDING  Incomplete  Resp panel by RT-PCR (RSV, Flu A&B, Covid) Anterior Nasal Swab     Status: Abnormal   Collection Time: 01/28/23  8:11 AM   Specimen: Anterior Nasal Swab  Result Value Ref Range Status   SARS Coronavirus 2 by RT PCR POSITIVE (A) NEGATIVE Final    Comment: (NOTE) SARS-CoV-2 target nucleic acids are DETECTED.  The SARS-CoV-2 RNA is generally detectable in upper respiratory specimens during the acute phase of infection. Positive results are indicative of the presence of the identified virus, but do not rule out bacterial infection or co-infection with other pathogens  not detected by the test. Clinical correlation with patient history and other diagnostic information is necessary to determine patient infection status. The expected result is Negative.  Fact Sheet for Patients: EntrepreneurPulse.com.au  Fact Sheet for Healthcare Providers: IncredibleEmployment.be  This test is not yet approved or cleared by the [place] FDA and  has been authorized for detection and/or diagnosis of SARS-CoV-2 by FDA under an Emergency Use Authorization (EUA).  This EUA will remain in effect (meaning this test can be used) for the duration of  the COVID-19 declaration under Section 564(b)(1) of the A ct, 21 U.S.C. section 360bbb-3(b)(1), unless the authorization is terminated or revoked sooner.     Influenza A by PCR NEGATIVE NEGATIVE Final   Influenza B by PCR  NEGATIVE NEGATIVE Final    Comment: (NOTE) The Xpert Xpress SARS-CoV-2/FLU/RSV plus assay is intended as an aid in the diagnosis of influenza from Nasopharyngeal swab specimens and should not be used as a sole basis for treatment. Nasal washings and aspirates are unacceptable for Xpert Xpress SARS-CoV-2/FLU/RSV testing.  Fact Sheet for Patients: EntrepreneurPulse.com.au  Fact Sheet for Healthcare Providers: IncredibleEmployment.be  This test is not yet approved or cleared by the [place] FDA and has been authorized for detection and/or diagnosis of SARS-CoV-2 by FDA under an Emergency Use Authorization (EUA). This EUA will remain in effect (meaning this test can be used) for the duration of the COVID-19 declaration under Section 564(b)(1) of the Act, 21 U.S.C. section 360bbb-3(b)(1), unless the authorization is terminated or revoked.     Resp Syncytial Virus by PCR NEGATIVE NEGATIVE Final    Comment: (NOTE) Fact Sheet for Patients: EntrepreneurPulse.com.au  Fact Sheet for Healthcare  Providers: IncredibleEmployment.be  This test is not yet approved or cleared by the [place] FDA and has been authorized for detection and/or diagnosis of SARS-CoV-2 by FDA under an Emergency Use Authorization (EUA). This EUA will remain in effect (meaning this test can be used) for the duration of the COVID-19 declaration under Section 564(b)(1) of the Act, 21 U.S.C. section 360bbb-3(b)(1), unless the authorization is terminated or revoked.  Performed at [facility], [address], [place], [place] [address]   MRSA Next Gen by PCR, Nasal     Status: None   Collection Time: 01/28/23 10:09 AM   Specimen: Nasal Mucosa; Nasal Swab  Result Value Ref Range Status   MRSA by PCR Next Gen NOT DETECTED NOT DETECTED Final    Comment: (NOTE) The GeneXpert MRSA Assay (FDA approved for NASAL specimens only), is one component of a comprehensive MRSA colonization surveillance program. It is not intended to diagnose MRSA infection nor to guide or monitor treatment for MRSA infections. Test performance is not FDA approved in patients less than 29 years old. Performed at [facility], [address] [place], [place] [address]     Pertinent Lab seen by me:    Latest Ref Rng & Units 01/30/2023    3:55 AM 01/29/2023    3:10 AM 01/28/2023    6:58 AM  CBC  WBC 4.0 - 10.5 K/uL 15.8  16.1  15.1   Hemoglobin 12.0 - 15.0 g/dL 9.0  9.2  11.6   Hematocrit 36.0 - 46.0 % 29.9  31.0  36.9   Platelets 150 - 400 K/uL 163  142  214       Latest Ref Rng & Units 01/30/2023    3:55 AM 01/29/2023    3:10 AM 01/28/2023    7:00 AM  CMP  Glucose 70 - 99 mg/dL 125  83  277   BUN 8 - 23 mg/dL '20  22  21   '$ Creatinine 0.44 - 1.00 mg/dL 0.87  0.82  0.95   Sodium 135 - 145 mmol/L 135  138  136   Potassium 3.5 - 5.1 mmol/L 3.5  4.5  5.0   Chloride 98 - 111 mmol/L 101  109  105   CO2 22 - 32 mmol/L '25  21  18   '$ Calcium 8.9 - 10.3 mg/dL 8.1  8.2  8.8   Total Protein 6.5 - 8.1 g/dL 6.1   7.3    Total Bilirubin 0.3 - 1.2 mg/dL 0.5   0.9   Alkaline Phos 38 - 126 U/L 85   108  AST 15 - 41 U/L 17   28   ALT 0 - 44 U/L 18   26     Pertinent Imagings/Other Imagings Plain films and CT images have been personally visualized and interpreted; radiology reports have been reviewed. Decision making incorporated into the Impression / Recommendations.  DG Chest Port 1 View  Result Date: 01/28/2023 CLINICAL DATA:  Shortness of breath. EXAM: PORTABLE CHEST 1 VIEW COMPARISON:  PA Lat chest 10/04/2022, CTA chest 10/05/2022 FINDINGS: 7:12 a.m. There are partially translucent electrical pads overlying the left mid to lower lung field. Left-sided single lead pacer/AID and wire insertion are unaltered. 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 Stable mild cardiomegaly.  The central vessels are normal caliber. There is patchy consolidation in the left lower lung field and scattered interstitial and airspace opacities of the right mid and lower lung, findings consistent with multilobar pneumonia. There is no substantial pleural effusion. The mediastinum is normally outlined. There is aortic calcific plaque. Osteopenia and mild thoracic dextroscoliosis. IMPRESSION: Patchy consolidation in the left lower lung field and scattered interstitial and airspace opacities of the right mid and lower lung field, findings consistent with multilobar pneumonia. Clinical correlation and radiographic follow-up recommended. Electrical pads overlying the left chest. Electronically Signed   By: Telford Nab M.D.   On: 01/28/2023 07:31   DG BONE DENSITY (DXA)  Result Date: 01/17/2023 EXAM: DUAL X-RAY ABSORPTIOMETRY (DXA) FOR BONE MINERAL DENSITY IMPRESSION: Referring Physician:  [place] Your patient completed a bone mineral density test using GE Lunar iDXA system (analysis version: 16). Technologist: lmn PATIENT: Name: Katherine Larson, Katherine Larson Patient ID: [id number] Birth Date: [birth year] Height: 62.5 in. Sex: Female Measured: 01/17/2023 Weight: 129.2  lbs. Indications: Advanced Age, Caucasian, COPD, Diabetic non insulin, Gabapentin, Height Loss (781.91), Hysterectomy, One ovary removed, Postmenopausal, Prevacid, Secondary Osteoporosis Fractures: None Treatments: Vitamin D (E933.5) ASSESSMENT: The BMD measured at Femur Neck Right is 0.766 g/cm2 with a T-score of -2.0. This patient is considered osteopenic/low bone mass according to [facility]) criteria. The quality of the exam is good. The lumbar spine was excluded due to degenerative changes as noted on previous exam. Site Region Measured Date Measured Age YA BMD Significant CHANGE T-score DualFemur Neck Right 01/17/2023 75.1 -3.0 0.766 g/cm2 DualFemur Neck Right 06/03/2019 71.5 -2.1 0.742 g/cm2 DualFemur Total Mean 01/17/2023 75.1 -1.0 0.887 g/cm2 * DualFemur Total Mean 06/03/2019 71.5 -0.1 1.001 g/cm2 Left Forearm Radius 33% 01/17/2023 75.1 -1.2 0.774 g/cm2 * Left Forearm Radius 33% 06/03/2019 71.5 -0.2 0.868 g/cm2 World Health Organization [facility]) criteria for post-menopausal, Caucasian Women: Normal       T-score at or above -1 SD Osteopenia   T-score between -1 and -2.5 SD Osteoporosis T-score at or below -2.5 SD RECOMMENDATION: 1. All patients should optimize calcium and vitamin D intake. 2. Consider FDA-approved medical therapies in postmenopausal women and men aged 43 years and older, based on the following: a. A hip or vertebral (clinical or morphometric) fracture. b. T-score = -2.5 at the femoral neck or spine after appropriate evaluation to exclude secondary causes. c. Low bone mass (T-score between -1.0 and -2.5 at the femoral neck or spine) and a 10-year probability of a hip fracture = 3% or a 10-year probability of a major osteoporosis-related fracture = 20% based on the [place]-adapted WHO algorithm. d. Clinician judgment and/or patient preferences may indicate treatment for people with 10-year fracture probabilities above or below these levels. FOLLOW-UP: Patients with diagnosis of  osteoporosis or at high risk for fracture should have  regular bone mineral density tests.? Patients eligible for Medicare are allowed routine testing every 2 years.? The testing frequency can be increased to one year for patients who have rapidly progressing disease, are receiving or discontinuing medical therapy to restore bone mass, or have additional risk factors. I have reviewed this study and agree with the findings. Mark A. Thornton Papas, M.D. [facility] FRAX* 10-year Probability of Fracture Based on femoral neck BMD: DualFemur (Right) Major Osteoporotic Fracture: 12.5% Hip Fracture:                3.3% Population:                  [place] (Caucasian) Risk Factors:                Secondary Osteoporosis *FRAX is a [place] [employer] for Metabolic Bone Disease, a [place] (WHO) Quest Diagnostics. ASSESSMENT: The probability of a major osteoporotic fracture is 12.5% within the next ten years. The probability of a hip fracture is 3.3% within the next ten years. I have reviewed this report and agree with the above findings. Mark A. Thornton Papas, M.D. [facility] Radiology Electronically Signed   By: Lavonia Dana M.D.   On: 01/17/2023 12:04    I spent 80 minutes for this patient encounter including review of prior medical records/discussing diagnostics and treatment plan with the patient/family/coordinate care with primary/other specialits with greater than 50% of time in face to face encounter.   Electronically signed by:   Rosiland Oz, MD Infectious Disease Physician [facility] for Infectious Disease Pager: [phone]

## 2023-01-30 NOTE — Progress Notes (Signed)
PROGRESS NOTE  Katherine Larson [id number] DOB: 10-Dec-1945 DOA: 01/28/2023 PCP: Harlan Stains, MD  Brief History:  77 year old female with a history of coronary artery disease, hypertension, hyperlipidemia, HFrEF (EF 30-35%) status post ICD, GERD, lymphocytic colitis, and left bundle branch block presenting with 1 day history of shortness of breath.  The patient was diagnosed with COVID on 01/09/2023 and given a 5-day course of Paxlovid.  She finished the course.  She was doing fine until the past 24 hours when she developed worsening shortness of breath.  She denies any fevers, chills, headache, hemoptysis, diarrhea, abdominal pain.  She had an episode of nausea and vomiting.  There is no hematemesis.  Upon EMS arrival, the patient was noted to be hypoxic with saturation in the 80s on room air.  She had difficulty tolerating CPAP and was placed on nonrebreather.  Upon arrival in the emergency department, the patient was placed on BiPAP. In the ED, the patient was afebrile and hemodynamically stable with oxygen saturation 100% on BiPAP.  WBC 15.1, hemoglobin 11.6, platelets 214,000.  Sodium 136, potassium 5.0, bicarbonate 18, serum creatinine 0.95.  LFTs were unremarkable.  Lactic acid was 5.2. troponin 169.  BNP 503.  Chest x-ray showed left lower lobe, right lower lobe, and right middle lobe opacities.  The patient was started on levofloxacin.  She was given 2 L of IV fluid and started on maintenance fluids.   Assessment/Plan: Sepsis -Secondary to pneumonia and bacteremia -Lactic acid 5.2 -Follow blood cultures -Start vancomycin pending culture data -Continue levofloxacin -Obtain UA 6-10 WBC -3/10--personally reviewed CXR--L>R lower lobe opacities  Pseudomonas Bacteremia -Multiplex PCR>>Pseudomonas, not resistant to carbapenem -continue levoflox -repeat blood culture -consult cardiology for TEE   Lobar pneumonia -Check PCT 7.52>>34.14 -Continue levofloxacin -d/c  vanc/aztreonam -started merrem   Acute respiratory failure with hypoxia -Currently on BiPAP>>3L>>2L [place] -Wean as tolerated -Secondary to pneumonia, pulmonary edema   Acute on Chronic HFrEF -12/29/2022 echo EF 30-35%, +WMA, grade 1 DD -Holding furosemide initially with sepsis and fluid resuscitation -Restart carvedilol  -Holding losartan temporarily -Holding [place] temporarily in the setting of active infection -now with signs of fluid overload>>lasix IV x 1 and then restart po lasix   CKD stage IIIa -Baseline creatinine 0.9-1.2 -Monitor BMP   Mixed hyperlipidemia -Continue statin   Coronary artery disease -Status post CABG 1996 -Continue aspirin -Continue Lipitor   Elevated troponin -due to demand ischemia -monitor clinically         Family Communication:  spouse updated 3/11   Consultants:  none   Code Status:  FULL    DVT Prophylaxis:  [place] Lovenox     Procedures: As Listed in Progress Note Above   Antibiotics: Levoflox 3/10, 3/12>> Vanc/aztreonam 3/10 Merrem 3/11>>          Subjective: Pt denies f/c, cp.  Continues to have sob.  Denies n/v/d, abd pain  Objective: Vitals:   01/29/23 1512 01/29/23 2014 01/29/23 2350 01/30/23 0522  BP: (!) 123/56 135/66 125/65 131/64  Pulse: 89 (!) 105 95 (!) 110  Resp: '20 20 18 20  '$ Temp: 98.6 F (37 C) 98.5 F (36.9 C) 98.4 F (36.9 C) 98.5 F (36.9 C)  TempSrc:   Oral   SpO2: 91% 95% 97% 92%  Weight:      Height:        Intake/Output Summary (Last 24 hours) at 01/30/2023 0753 Last data filed at 01/30/2023 0500 Gross per 24 hour  Intake 480 ml  Output 1050 ml  Net -570 ml   Weight change:  Exam:  General:  Pt is alert, follows commands appropriately, not in acute distress HEENT: No icterus, No thrush, No neck mass, [place]/AT Cardiovascular: RRR, S1/S2, no rubs, no gallops Respiratory: bilateral rhonchi. No wheeze Abdomen: Soft/+BS, non tender, non distended, no guarding Extremities: No edema, No  lymphangitis, No petechiae, No rashes, no synovitis   Data Reviewed: I have personally reviewed following labs and imaging studies Basic Metabolic Panel: Recent Labs  Lab 01/28/23 0700 01/29/23 0310 01/30/23 0355  NA 136 138 135  K 5.0 4.5 3.5  CL 105 109 101  CO2 18* 21* 25  GLUCOSE 277* 83 125*  BUN '21 22 20  '$ CREATININE 0.95 0.82 0.87  CALCIUM 8.8* 8.2* 8.1*  MG  --   --  1.6*  PHOS  --   --  2.8   Liver Function Tests: Recent Labs  Lab 01/28/23 0700 01/30/23 0355  AST 28 17  ALT 26 18  ALKPHOS 108 85  BILITOT 0.9 0.5  PROT 7.3 6.1*  ALBUMIN 3.6 2.7*   No results for input(s): "LIPASE", "AMYLASE" in the last 168 hours. No results for input(s): "AMMONIA" in the last 168 hours. Coagulation Profile: Recent Labs  Lab 01/28/23 0700  INR 1.1   CBC: Recent Labs  Lab 01/28/23 0658 01/29/23 0310 01/30/23 0355  WBC 15.1* 16.1* 15.8*  NEUTROABS 11.4*  --   --   HGB 11.6* 9.2* 9.0*  HCT 36.9 31.0* 29.9*  MCV 101.4* 107.3* 103.5*  PLT 214 142* 163   Cardiac Enzymes: No results for input(s): "CKTOTAL", "CKMB", "CKMBINDEX", "TROPONINI" in the last 168 hours. BNP: Invalid input(s): "POCBNP" CBG: Recent Labs  Lab 01/28/23 1419 01/28/23 1728  GLUCAP 133* 126*   HbA1C: Recent Labs    01/28/23 0911  HGBA1C 6.2*   Urine analysis:    Component Value Date/Time   COLORURINE YELLOW 01/28/2023 1206   APPEARANCEUR HAZY (A) 01/28/2023 1206   LABSPEC 1.020 01/28/2023 1206   PHURINE 5.5 01/28/2023 1206   GLUCOSEU >=500 (A) 01/28/2023 1206   HGBUR NEGATIVE 01/28/2023 1206   BILIRUBINUR NEGATIVE 01/28/2023 1206   KETONESUR NEGATIVE 01/28/2023 1206   PROTEINUR NEGATIVE 01/28/2023 1206   NITRITE POSITIVE (A) 01/28/2023 1206   LEUKOCYTESUR NEGATIVE 01/28/2023 1206   Sepsis Labs: '@LABRCNTIP'$ (procalcitonin:4,lacticidven:4) ) Recent Results (from the past 240 hour(s))  Blood culture (routine x 2)     Status: None (Preliminary result)   Collection Time: 01/28/23   6:59 AM   Specimen: BLOOD RIGHT WRIST  Result Value Ref Range Status   Specimen Description   Final    BLOOD RIGHT WRIST BOTTLES DRAWN AEROBIC AND ANAEROBIC Performed at [facility], [address], [place], [place] [address]    Special Requests   Final    Blood Culture adequate volume Performed at [facility], [address], [place], [place] [address]    Culture  Setup Time   Final    GRAM NEGATIVE RODS Gram Stain Report Called to,Read Back By and Verified With: D.SMITH @ 1451 BY STEPHTR 01/29/23 BOTTLES DRAWN AEROBIC ONLY CRITICAL RESULT CALLED TO, READ BACK BY AND VERIFIED WITH: RN D. [facility] [id number] '@1755'$  FH Performed at [facility], [place] [address], [place],  [address]    Culture GRAM NEGATIVE RODS  Final   Report Status PENDING  Incomplete  Blood Culture ID Panel (Reflexed)     Status: Abnormal   Collection Time: 01/28/23  6:59 AM  Result Value Ref Range Status   Enterococcus faecalis NOT DETECTED NOT DETECTED Final   Enterococcus Faecium NOT DETECTED NOT DETECTED Final   Listeria monocytogenes NOT DETECTED NOT DETECTED Final   Staphylococcus species NOT DETECTED NOT DETECTED Final   Staphylococcus aureus (BCID) NOT DETECTED NOT DETECTED Final   Staphylococcus epidermidis NOT DETECTED NOT DETECTED Final   Staphylococcus lugdunensis NOT DETECTED NOT DETECTED Final   Streptococcus species NOT DETECTED NOT DETECTED Final   Streptococcus agalactiae NOT DETECTED NOT DETECTED Final   Streptococcus pneumoniae NOT DETECTED NOT DETECTED Final   Streptococcus pyogenes NOT DETECTED NOT DETECTED Final   A.calcoaceticus-baumannii NOT DETECTED NOT DETECTED Final   Bacteroides fragilis NOT DETECTED NOT DETECTED Final   Enterobacterales NOT DETECTED NOT DETECTED Final   Enterobacter cloacae complex NOT DETECTED NOT DETECTED Final   Escherichia coli NOT DETECTED NOT DETECTED Final   Klebsiella aerogenes NOT DETECTED NOT DETECTED Final   Klebsiella oxytoca NOT DETECTED NOT  DETECTED Final   Klebsiella pneumoniae NOT DETECTED NOT DETECTED Final   Proteus species NOT DETECTED NOT DETECTED Final   Salmonella species NOT DETECTED NOT DETECTED Final   Serratia marcescens NOT DETECTED NOT DETECTED Final   Haemophilus influenzae NOT DETECTED NOT DETECTED Final   Neisseria meningitidis NOT DETECTED NOT DETECTED Final   Pseudomonas aeruginosa DETECTED (A) NOT DETECTED Final    Comment: CRITICAL RESULT CALLED TO, READ BACK BY AND VERIFIED WITH: RN D. Fernando Salinas [phone] '@1755'$  FH    Stenotrophomonas maltophilia NOT DETECTED NOT DETECTED Final   Candida albicans NOT DETECTED NOT DETECTED Final   Candida auris NOT DETECTED NOT DETECTED Final   Candida glabrata NOT DETECTED NOT DETECTED Final   Candida krusei NOT DETECTED NOT DETECTED Final   Candida parapsilosis NOT DETECTED NOT DETECTED Final   Candida tropicalis NOT DETECTED NOT DETECTED Final   Cryptococcus neoformans/gattii NOT DETECTED NOT DETECTED Final   CTX-M ESBL NOT DETECTED NOT DETECTED Final   Carbapenem resistance IMP NOT DETECTED NOT DETECTED Final   Carbapenem resistance KPC NOT DETECTED NOT DETECTED Final   Carbapenem resistance NDM NOT DETECTED NOT DETECTED Final   Carbapenem resistance VIM NOT DETECTED NOT DETECTED Final    Comment: Performed at [facility], [address], [place], [place] [address]  Blood culture (routine x 2)     Status: None (Preliminary result)   Collection Time: 01/28/23  7:04 AM   Specimen: BLOOD RIGHT FOREARM  Result Value Ref Range Status   Specimen Description   Final    BLOOD RIGHT FOREARM BOTTLES DRAWN AEROBIC AND ANAEROBIC   Special Requests Blood Culture adequate volume  Final   Culture   Final    NO GROWTH < 24 HOURS Performed at [facility], [address] [place], [place] [address]    Report Status PENDING  Incomplete  Resp panel by RT-PCR (RSV, Flu A&B, Covid) Anterior Nasal Swab     Status: Abnormal   Collection Time: 01/28/23  8:11 AM   Specimen:  Anterior Nasal Swab  Result Value Ref Range Status   SARS Coronavirus 2 by RT PCR POSITIVE (A) NEGATIVE Final    Comment: (NOTE) SARS-CoV-2 target nucleic acids are DETECTED.  The SARS-CoV-2 RNA is generally detectable in upper respiratory specimens during the acute phase of infection. Positive results are indicative of the presence of the identified virus, but do not rule out bacterial infection or co-infection with other pathogens not detected by the test. Clinical correlation with patient history  and other diagnostic information is necessary to determine patient infection status. The expected result is Negative.  Fact Sheet for Patients: EntrepreneurPulse.com.au  Fact Sheet for Healthcare Providers: IncredibleEmployment.be  This test is not yet approved or cleared by the [place] FDA and  has been authorized for detection and/or diagnosis of SARS-CoV-2 by FDA under an Emergency Use Authorization (EUA).  This EUA will remain in effect (meaning this test can be used) for the duration of  the COVID-19 declaration under Section 564(b)(1) of the A ct, 21 U.S.C. section 360bbb-3(b)(1), unless the authorization is terminated or revoked sooner.     Influenza A by PCR NEGATIVE NEGATIVE Final   Influenza B by PCR NEGATIVE NEGATIVE Final    Comment: (NOTE) The Xpert Xpress SARS-CoV-2/FLU/RSV plus assay is intended as an aid in the diagnosis of influenza from Nasopharyngeal swab specimens and should not be used as a sole basis for treatment. Nasal washings and aspirates are unacceptable for Xpert Xpress SARS-CoV-2/FLU/RSV testing.  Fact Sheet for Patients: EntrepreneurPulse.com.au  Fact Sheet for Healthcare Providers: IncredibleEmployment.be  This test is not yet approved or cleared by the [place] FDA and has been authorized for detection and/or diagnosis of SARS-CoV-2 by FDA under an Emergency Use  Authorization (EUA). This EUA will remain in effect (meaning this test can be used) for the duration of the COVID-19 declaration under Section 564(b)(1) of the Act, 21 U.S.C. section 360bbb-3(b)(1), unless the authorization is terminated or revoked.     Resp Syncytial Virus by PCR NEGATIVE NEGATIVE Final    Comment: (NOTE) Fact Sheet for Patients: EntrepreneurPulse.com.au  Fact Sheet for Healthcare Providers: IncredibleEmployment.be  This test is not yet approved or cleared by the [place] FDA and has been authorized for detection and/or diagnosis of SARS-CoV-2 by FDA under an Emergency Use Authorization (EUA). This EUA will remain in effect (meaning this test can be used) for the duration of the COVID-19 declaration under Section 564(b)(1) of the Act, 21 U.S.C. section 360bbb-3(b)(1), unless the authorization is terminated or revoked.  Performed at [facility], [address] [place], [place] [address]   MRSA Next Gen by PCR, Nasal     Status: None   Collection Time: 01/28/23 10:09 AM   Specimen: Nasal Mucosa; Nasal Swab  Result Value Ref Range Status   MRSA by PCR Next Gen NOT DETECTED NOT DETECTED Final    Comment: (NOTE) The GeneXpert MRSA Assay (FDA approved for NASAL specimens only), is one component of a comprehensive MRSA colonization surveillance program. It is not intended to diagnose MRSA infection nor to guide or monitor treatment for MRSA infections. Test performance is not FDA approved in patients less than 59 years old. Performed at [facility], [address], [place], [place] [address]      Scheduled Meds:  aspirin EC  81 mg Oral Daily   atorvastatin  80 mg Oral Daily   carvedilol  6.25 mg Oral BID WC   Chlorhexidine Gluconate Cloth  6 each Topical Daily   enoxaparin (LOVENOX) injection  40 mg Subcutaneous Q24H   furosemide  40 mg Oral BID   gabapentin  300 mg Oral TID   leflunomide  20 mg Oral Daily   omega-3  acid ethyl esters  1 g Oral BID   mouth rinse  15 mL Mouth Rinse 4 times per day   potassium chloride  20 mEq Oral Daily   Continuous Infusions:  magnesium sulfate bolus IVPB     meropenem (MERREM) IV 1 g (  01/30/23 0541)    Procedures/Studies: DG Chest Port 1 View  Result Date: 01/28/2023 CLINICAL DATA:  Shortness of breath. EXAM: PORTABLE CHEST 1 VIEW COMPARISON:  PA Lat chest 10/04/2022, CTA chest 10/05/2022 FINDINGS: 7:12 a.m. There are partially translucent electrical pads overlying the left mid to lower lung field. Left-sided single lead pacer/AID and wire insertion are unaltered. 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 Stable mild cardiomegaly.  The central vessels are normal caliber. There is patchy consolidation in the left lower lung field and scattered interstitial and airspace opacities of the right mid and lower lung, findings consistent with multilobar pneumonia. There is no substantial pleural effusion. The mediastinum is normally outlined. There is aortic calcific plaque. Osteopenia and mild thoracic dextroscoliosis. IMPRESSION: Patchy consolidation in the left lower lung field and scattered interstitial and airspace opacities of the right mid and lower lung field, findings consistent with multilobar pneumonia. Clinical correlation and radiographic follow-up recommended. Electrical pads overlying the left chest. Electronically Signed   By: Telford Nab M.D.   On: 01/28/2023 07:31   DG BONE DENSITY (DXA)  Result Date: 01/17/2023 EXAM: DUAL X-RAY ABSORPTIOMETRY (DXA) FOR BONE MINERAL DENSITY IMPRESSION: Referring Physician:  [place] Your patient completed a bone mineral density test using GE Lunar iDXA system (analysis version: 16). Technologist: lmn PATIENT: Name: Lyle, Pruyn Patient ID: [id number] Birth Date: [birth year] Height: 62.5 in. Sex: Female Measured: 01/17/2023 Weight: 129.2 lbs. Indications: Advanced Age, Caucasian, COPD, Diabetic non insulin, Gabapentin, Height Loss (781.91),  Hysterectomy, One ovary removed, Postmenopausal, Prevacid, Secondary Osteoporosis Fractures: None Treatments: Vitamin D (E933.5) ASSESSMENT: The BMD measured at Femur Neck Right is 0.766 g/cm2 with a T-score of -2.0. This patient is considered osteopenic/low bone mass according to [facility]) criteria. The quality of the exam is good. The lumbar spine was excluded due to degenerative changes as noted on previous exam. Site Region Measured Date Measured Age YA BMD Significant CHANGE T-score DualFemur Neck Right 01/17/2023 75.1 -3.0 0.766 g/cm2 DualFemur Neck Right 06/03/2019 71.5 -2.1 0.742 g/cm2 DualFemur Total Mean 01/17/2023 75.1 -1.0 0.887 g/cm2 * DualFemur Total Mean 06/03/2019 71.5 -0.1 1.001 g/cm2 Left Forearm Radius 33% 01/17/2023 75.1 -1.2 0.774 g/cm2 * Left Forearm Radius 33% 06/03/2019 71.5 -0.2 0.868 g/cm2 World Health Organization [facility]) criteria for post-menopausal, Caucasian Women: Normal       T-score at or above -1 SD Osteopenia   T-score between -1 and -2.5 SD Osteoporosis T-score at or below -2.5 SD RECOMMENDATION: 1. All patients should optimize calcium and vitamin D intake. 2. Consider FDA-approved medical therapies in postmenopausal women and men aged 46 years and older, based on the following: a. A hip or vertebral (clinical or morphometric) fracture. b. T-score = -2.5 at the femoral neck or spine after appropriate evaluation to exclude secondary causes. c. Low bone mass (T-score between -1.0 and -2.5 at the femoral neck or spine) and a 10-year probability of a hip fracture = 3% or a 10-year probability of a major osteoporosis-related fracture = 20% based on the [place]-adapted WHO algorithm. d. Clinician judgment and/or patient preferences may indicate treatment for people with 10-year fracture probabilities above or below these levels. FOLLOW-UP: Patients with diagnosis of osteoporosis or at high risk for fracture should have regular bone mineral density tests.? Patients  eligible for Medicare are allowed routine testing every 2 years.? The testing frequency can be increased to one year for patients who have rapidly progressing disease, are receiving or discontinuing medical therapy to restore bone mass, or have additional risk  factors. I have reviewed this study and agree with the findings. Mark A. Thornton Papas, M.D. [facility] F[facility]X* 10-year Probability of Fracture Based on femoral neck BMD: DualFemur (Right) Major Osteoporotic Fracture: 12.5% Hip Fracture:                3.3% Population:                  [place] (Caucasian) Risk Factors:                Secondary Osteoporosis *[facility] is a [facility] [employer] for Metabolic Bone Disease, a [place] (WHO) [facility]. ASSESSMENT: The probability of a major osteoporotic fracture is 12.5% within the next ten years. The probability of a hip fracture is 3.3% within the next ten years. I have reviewed this report and agree with the above findings. Mark A. Thornton Papas, M.D. [facility] Radiology Electronically Signed   By: Lavonia Dana M.D.   On: 01/17/2023 12:04    Orson Eva, DO  [facility]  If 7PM-7AM, please contact night-coverage [email or web address] Password [facility] 01/30/2023, 7:53 AM   LOS: 2 days

## 2023-01-30 NOTE — H&P (View-Only) (Signed)
CARDIOLOGY CONSULT NOTE    Patient ID: Katherine Larson; [id number]; [birth year]   Admit date: 01/28/2023 Date of Consult: 01/30/2023  Primary Care Provider: Harlan Stains, MD Primary Cardiologist:  Primary Electrophysiologist:     Patient Profile:   Katherine Larson is a 77 y.o. female with a hx of CAD status post CABG, ICM LVEF 30 to 35% status post [employer] single-chamber ICD, LBBB, lymphocytic colitis who is being seen today for the evaluation of chest pain and bacteremia at the request of Dr. Carles Collet.  History of Present Illness:   Katherine Larson is a 77 year old F known to have CAD status post CABG in 1996, ICM LVEF 30-35% s/p [employer] single-chamber ICD, LBBB, lymphocytic colitis presented to ER with SOB and chest pain since 1 day prior to presentation. There was imaging evidence of multilobular opacities consistent with pneumonia and blood cultures positive for Pseudomonas aeruginosa.  Patient is active at baseline and has no symptoms of angina until 1 day prior to presentation when she had acute development of shortness of breath and chest pain. Otherwise she was independent at home, can perform her household chores with no symptoms of angina.  Has no symptoms of dizziness, lightheadedness, syncope, leg swelling and palpitations.  Currently she is managed for pneumonia and bacteremia with IV antibiotics.  Cardiology was consulted for chest pain and possibility of TEE due to bacteremia and presence of single-chamber ICD.          She was diagnosed with COVID-19 infection recently in 12/2022  Past Medical History:  Diagnosis Date   Anemia    anemia of chronic disease +/- IDA followed by Dr. Earlie Server. received iron infusions in the past   Asthma    Borderline glaucoma    CAD (coronary artery disease)    a. s/p CABG 1996. b. low risk nuc 2011. c. LHC 04/2016 due to drop in EF -> occluded native LAD,  widely patent sequential LIMA-D1-LAD.   Chronic kidney disease    "mild  stage of kidney disease"   Chronic systolic CHF (congestive heart failure) (HCC)    Complication of anesthesia    DM2 (diabetes mellitus, type 2) ([place])    not on any medicine for this at this time, 05/2016   Dyspnea    with exertion   GERD (gastroesophageal reflux disease)    Gout    History of blood transfusion    History of hiatal hernia    in 20s   History of pneumonia    March, 2016   HLD (hyperlipidemia)    HTN (hypertension)    Hyperthyroidism 01/21/2016   Inappropriate sinus tachycardia    LBBB (left bundle branch block)    a. Seen in 04/2016   Lymphocytic colitis 04/2020   Macular degeneration    left   Myocardial infarction [facility])    1996   Neuropathy    Neuropathy    NICM (nonischemic cardiomyopathy) ([place])    a. Dx 04/2016 - EF 25-30%, diffuse HK, elevated LVEDP, mild MR, mod LAE.   Normocytic anemia 10/17/2021   NSVT (nonsustained ventricular tachycardia) (HCC)    PAT (paroxysmal atrial tachycardia)    Pneumonia    PONV (postoperative nausea and vomiting)    "after just about every surgery I've had"   Rheumatoid arthritis ([place])    RA- hands   Seasonal allergies     Past Surgical History:  Procedure Laterality Date   ABDOMINAL HYSTERECTOMY     APPENDECTOMY  BACK SURGERY     BIOPSY  04/27/2020   Procedure: BIOPSY;  Surgeon: Daneil Dolin, MD;  [facility];  Service: Endoscopy;;  ascending colon biopsy   CARDIAC CATHETERIZATION N/A 05/01/2016   Procedure: Right/Left Heart Cath and Coronary/Graft Angiography;  Surgeon: Leonie Man, MD;  Location: [place] [facility];  Service: Cardiovascular;  Laterality: N/A;   CHOLECYSTECTOMY     COLONOSCOPY  11/2011   Dr. Magod:ext/int hemorrhoids, diverticulosis sigmoid colon and distal desc colon, mid-desc colon hyperplastic polyps.    COLONOSCOPY WITH PROPOFOL N/A 04/27/2020   Rourk: Diverticulosis, hemorrhoids, normal terminal ileum.  Random colon biopsies significant for chronic lymphocytic colitis. No  repeat due to age.   CORONARY ARTERY BYPASS GRAFT  1996   2 vessels   ESOPHAGOGASTRODUODENOSCOPY  11/2011   Dr. Watt Climes: small hiatal hernia, one non-bleeding superficial gastric ulcer, medium-sized diverticulum in area of papilla   ESOPHAGOGASTRODUODENOSCOPY N/A 12/29/2015   Dr. Gala Romney: Chronic inactive gastritis, normal esophagus status post dilation, duodenal diverticula   ESOPHAGOGASTRODUODENOSCOPY (EGD) WITH PROPOFOL N/A 07/24/2019   Dr. Gala Romney: Normal esophagus status post empiric dilation, small hiatal hernia, large D2 diverticulum   ESOPHAGOGASTRODUODENOSCOPY (EGD) WITH PROPOFOL N/A 06/03/2021   Surgeon: Daneil Dolin, MD; normal esophagus, normal stomach, normal duodenum.  Abnormal hypopharyngeal mucosa bilaterally just distal to the base of the tongue (right side greater than left), overlying mucosa appeared irregular and somewhat verrucous in appearance.  Recommended ENT evaluation.   EYE SURGERY Bilateral    cataract removal   FOOT SURGERY     removal bone spur   FRACTURE SURGERY Right    arm   GIVENS CAPSULE STUDY  11/2012   Dr. Watt Climes: minimal gastritis, normal small bowel capsule   HERNIA REPAIR     hiatal hernia   ICD IMPLANT N/A 02/04/2021   Procedure: ICD IMPLANT;  Surgeon: Evans Lance, MD;  Location: [place] [facility] LAB;  Service: Cardiovascular;  Laterality: N/A;   MALONEY DILATION N/A 07/24/2019   Procedure: Venia Minks DILATION;  Surgeon: Daneil Dolin, MD;  [facility];  Service: Endoscopy;  Laterality: N/A;   MALONEY DILATION N/A 06/03/2021   Procedure: Venia Minks DILATION;  Surgeon: Daneil Dolin, MD;  [facility];  Service: Endoscopy;  Laterality: N/A;   MAXIMUM ACCESS (MAS)POSTERIOR LUMBAR INTERBODY FUSION (PLIF) 1 LEVEL N/A 08/14/2013   Procedure:  MAXIMUM ACCESS SURGERY(MAS) POSTERIOR LUMBAR INTERBODY FUSION LUMBAR THREE-FOUR ;  Surgeon: Eustace Moore, MD;  Location: [place] [facility];  Service: Neurosurgery;  Laterality: N/A;   MAXIMUM ACCESS  SURGERY(MAS) POSTERIOR LUMBAR INTERBODY FUSION LUMBAR THREE-FOUR    PTCA     RIGHT/LEFT HEART CATH AND CORONARY ANGIOGRAPHY N/A 03/26/2020   Procedure: RIGHT/LEFT HEART CATH AND CORONARY ANGIOGRAPHY;  Surgeon: Leonie Man, MD;  Location: [place] [facility] LAB;  Service: Cardiovascular;  Laterality: N/A;   SPINAL CORD STIMULATOR BATTERY EXCHANGE N/A 01/19/2021   Procedure: Spinal cord stimulator battery change;  Surgeon: Reece Agar, MD;  Location: [place];  Service: Neurosurgery;  Laterality: N/A;   SPINAL CORD STIMULATOR INSERTION N/A 06/16/2016   Procedure: LUMBAR SPINAL CORD STIMULATOR INSERTION;  Surgeon: Clydell Hakim, MD;  Location: [place] [facility];  Service: Neurosurgery;  Laterality: N/A;  LUMBAR SPINAL CORD STIMULATOR INSERTION   tens  05/2016   TONSILLECTOMY         Inpatient Medications: Scheduled Meds:  aspirin EC  81 mg Oral Daily   atorvastatin  80 mg Oral Daily  carvedilol  6.25 mg Oral BID WC   Chlorhexidine Gluconate Cloth  6 each Topical Daily   enoxaparin (LOVENOX) injection  40 mg Subcutaneous Q24H   furosemide  40 mg Oral BID   gabapentin  300 mg Oral TID   omega-3 acid ethyl esters  1 g Oral BID   mouth rinse  15 mL Mouth Rinse 4 times per day   potassium chloride  20 mEq Oral Daily   Continuous Infusions:  ceFEPime (MAXIPIME) IV 2 g (01/30/23 1420)   PRN Meds: acetaminophen **OR** acetaminophen, ondansetron **OR** ondansetron (ZOFRAN) IV, mouth rinse, oxyCODONE  Allergies:    Allergies  Allergen Reactions   Biaxin [Clarithromycin] Rash and Other (See Comments)    Blisters in mouth    Penicillins Rash and Other (See Comments)    Blisters in mouth Has patient had a PCN reaction causing immediate rash, facial/tongue/throat swelling, SOB or lightheadedness with hypotension: Yesyes Has patient had a PCN reaction causing severe rash involving mucus membranes or skin necrosis: Nono Has patient had a PCN reaction that required hospitalization Nono Has  patient had a PCN reaction occurring within the last 10 years: Nono If all of the above answers are "NO", then may proceed   Hydroxychloroquine Other (See Comments)   Sulfamethoxazole     Other Reaction(s): Other (See Comments)   Sulfasalazine Other (See Comments)   Losartan Potassium     Other reaction(s): elevated creatinine    Social History:   Social History   Socioeconomic History   Marital status: Married    Spouse name: Not on file   Number of children: 3   Years of education: Not on file   Highest education level: Not on file  Occupational History   Not on file  Tobacco Use   Smoking status: Former    Packs/day: 0.75    Years: 15.00    Total pack years: 11.25    Types: Cigarettes    Quit date: 11/20/1994    Years since quitting: 28.2   Smokeless tobacco: Never   Tobacco comments:    Quit in [place] Use   Vaping Use: Never used  Substance and Sexual Activity   Alcohol use: Never    Alcohol/week: 0.0 standard drinks of alcohol   Drug use: Never   Sexual activity: Yes  Other Topics Concern   Not on file  Social History Narrative   2 living children, one deceased age 69   Right handed   One story home   Drinks coffee am   Social Determinants of Health   Financial Resource Strain: Not on file  Food Insecurity: No Food Insecurity (01/28/2023)   Hunger Vital Sign    Worried About Running Out of Food in the Last Year: Never true    Ran Out of Food in the Last Year: Never true  Transportation Needs: No Transportation Needs (01/28/2023)   PRAPARE - Hydrologist (Medical): No    Lack of Transportation (Non-Medical): No  Physical Activity: Not on file  Stress: Not on file  Social Connections: Not on file  Intimate Partner Violence: Not At Risk (01/28/2023)   Humiliation, Afraid, Rape, and Kick questionnaire    Fear of Current or Ex-Partner: No    Emotionally Abused: No    Physically Abused: No    Sexually Abused: No    Family  History:    Family History  Problem Relation Age of Onset   Heart attack Father  Heart attack Mother    Bladder Cancer Brother    Cervical cancer Daughter        ?   Colon cancer Neg Hx      ROS:  Please see the history of present illness.  ROS  All other ROS reviewed and negative.     Physical Exam/Data:   Vitals:   01/29/23 2014 01/29/23 2350 01/30/23 0522 01/30/23 1313  BP: 135/66 125/65 131/64 128/62  Pulse: (!) 105 95 (!) 110 93  Resp: '20 18 20 16  '$ Temp: 98.5 F (36.9 C) 98.4 F (36.9 C) 98.5 F (36.9 C) 99.4 F (37.4 C)  TempSrc:  Oral  Oral  SpO2: 95% 97% 92% 96%  Weight:      Height:        Intake/Output Summary (Last 24 hours) at 01/30/2023 1516 Last data filed at 01/30/2023 0500 Gross per 24 hour  Intake 480 ml  Output 200 ml  Net 280 ml   Filed Weights   01/28/23 1422  Weight: 60.7 kg   Body mass index is 24.09 kg/m.  General:  Well nourished, well developed, in no acute distress HEENT: normal Lymph: no adenopathy Neck: no JVD Endocrine:  No thryomegaly Vascular: No carotid bruits; FA pulses 2+ bilaterally without bruits  Cardiac:  normal S1, S2; RRR; no murmur  Lungs:  clear to auscultation bilaterally, no wheezing, rhonchi or rales  Abd: soft, nontender, no hepatomegaly  Ext: no edema Musculoskeletal:  No deformities, BUE and BLE strength normal and equal Skin: warm and dry  Neuro:  CNs 2-12 intact, no focal abnormalities noted Psych:  Normal affect   EKG:  The EKG was personally reviewed and demonstrates: LBBB, underlying rhythm was unable to be determined due to tachycardia. Telemetry:  Telemetry was personally reviewed and demonstrates: Rates controlled, underlying LBBB and HR 90s.  Relevant CV Studies:  Laboratory Data:  Chemistry Recent Labs  Lab 01/28/23 0700 01/29/23 0310 01/30/23 0355  NA 136 138 135  K 5.0 4.5 3.5  CL 105 109 101  CO2 18* 21* 25  GLUCOSE 277* 83 125*  BUN '21 22 20  '$ CREATININE 0.95 0.82 0.87   CALCIUM 8.8* 8.2* 8.1*  GFRNONAA >60 >60 >60  ANIONGAP '13 8 9    '$ Recent Labs  Lab 01/28/23 0700 01/30/23 0355  PROT 7.3 6.1*  ALBUMIN 3.6 2.7*  AST 28 17  ALT 26 18  ALKPHOS 108 85  BILITOT 0.9 0.5   Hematology Recent Labs  Lab 01/28/23 0658 01/29/23 0310 01/30/23 0355  WBC 15.1* 16.1* 15.8*  RBC 3.64* 2.89* 2.89*  HGB 11.6* 9.2* 9.0*  HCT 36.9 31.0* 29.9*  MCV 101.4* 107.3* 103.5*  MCH 31.9 31.8 31.1  MCHC 31.4 29.7* 30.1  RDW 15.6* 15.7* 15.7*  PLT 214 142* 163   Cardiac EnzymesNo results for input(s): "TROPONINI" in the last 168 hours. No results for input(s): "TROPIPOC" in the last 168 hours.  BNP Recent Labs  Lab 01/28/23 0710 01/29/23 0310  BNP 503.0* 1,670.0*    DDimer No results for input(s): "DDIMER" in the last 168 hours.  Radiology/Studies:  DG CHEST PORT 1 VIEW  Result Date: 01/30/2023 CLINICAL DATA:  Pneumonia, CHF, pulmonary edema, COPD, open heart surgery EXAM: PORTABLE CHEST 1 VIEW COMPARISON:  Portable exam 0811 hours compared to 01/28/2023 FINDINGS: Intraspinal stimulator and LEFT subclavian ICD unchanged. Enlargement of cardiac silhouette post median sternotomy. Atherosclerotic calcification aorta. Pulmonary vascular congestion with asymmetric pulmonary infiltrates greater on LEFT. This likely represents a combination  of mild pulmonary edema and superimposed LEFT lung consolidation/pneumonia. Minimal LEFT pleural effusion without pneumothorax. Bones demineralized. IMPRESSION: Enlargement of cardiac silhouette with probable pulmonary edema and superimposed LEFT lung consolidation/pneumonia. Aortic Atherosclerosis (ICD10-I70.0). Electronically Signed   By: Lavonia Dana M.D.   On: 01/30/2023 08:26   DG Chest Port 1 View  Result Date: 01/28/2023 CLINICAL DATA:  Shortness of breath. EXAM: PORTABLE CHEST 1 VIEW COMPARISON:  PA Lat chest 10/04/2022, CTA chest 10/05/2022 FINDINGS: 7:12 a.m. There are partially translucent electrical pads overlying the left  mid to lower lung field. Left-sided single lead pacer/AID and wire insertion are unaltered. 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 Stable mild cardiomegaly.  The central vessels are normal caliber. There is patchy consolidation in the left lower lung field and scattered interstitial and airspace opacities of the right mid and lower lung, findings consistent with multilobar pneumonia. There is no substantial pleural effusion. The mediastinum is normally outlined. There is aortic calcific plaque. Osteopenia and mild thoracic dextroscoliosis. IMPRESSION: Patchy consolidation in the left lower lung field and scattered interstitial and airspace opacities of the right mid and lower lung field, findings consistent with multilobar pneumonia. Clinical correlation and radiographic follow-up recommended. Electrical pads overlying the left chest. Electronically Signed   By: Telford Nab M.D.   On: 01/28/2023 07:31    Assessment and Plan:   Patient is a 77 year old F known to have CAD status post CABG, ICM LVEF 30 to 35% status post [employer] single-chamber ICD, LBBB, lymphocytic colitis is currently admitted to the hospitalist team for the management of pneumonia and Pseudomonas aeruginosa bacteremia with IV antibiotics.  # Non-cardiac chest pain -No further intervention  # Tachycardia on admission multifactorial in the setting of pneumonia and bacteremia, has underlying LBBB, unlikely to be VT.  # Pseudomonas aeruginosa bacteremia -Obtain TTE to rule out infective endocarditis  # CAD status post CABG -Continue aspirin 81 mg once daily and Lipitor 80 mg nightly.  # ICM LVEF 30 to 35% status post ICD -Continue GDMT with Lasix 40 mg once daily, carvedilol 9.375 mg twice daily, losartan 100 mg once daily, Farxiga 10 mg once daily. -EKG shows LBBB, was evaluated by EP and did not think she will benefit from CRT-D upgrade.  I have spent a total of 60 minutes with patient reviewing chart , telemetry, EKGs, labs and examining  patient as well as establishing an assessment and plan that was discussed with the patient.  > 50% of time was spent in direct patient care.      For questions or updates, please contact [place] Please consult [email or web address] for contact info under Cardiology/STEMI.   Signed, Vangie Bicker, MD 01/30/2023 3:16 PM

## 2023-01-30 NOTE — Plan of Care (Signed)

## 2023-01-31 DIAGNOSIS — A419 Sepsis, unspecified organism: Secondary | ICD-10-CM | POA: Diagnosis not present

## 2023-01-31 DIAGNOSIS — I5022 Chronic systolic (congestive) heart failure: Secondary | ICD-10-CM | POA: Diagnosis not present

## 2023-01-31 DIAGNOSIS — I447 Left bundle-branch block, unspecified: Secondary | ICD-10-CM | POA: Diagnosis not present

## 2023-01-31 DIAGNOSIS — J9601 Acute respiratory failure with hypoxia: Secondary | ICD-10-CM | POA: Diagnosis not present

## 2023-01-31 DIAGNOSIS — J189 Pneumonia, unspecified organism: Secondary | ICD-10-CM

## 2023-01-31 LAB — BASIC METABOLIC PANEL
Anion gap: 9 (ref 5–15)
BUN: 24 mg/dL — ABNORMAL HIGH (ref 8–23)
CO2: 27 mmol/L (ref 22–32)
Calcium: 8.4 mg/dL — ABNORMAL LOW (ref 8.9–10.3)
Chloride: 96 mmol/L — ABNORMAL LOW (ref 98–111)
Creatinine, Ser: 0.95 mg/dL (ref 0.44–1.00)
GFR, Estimated: >60 mL/min (ref >60)
Glucose, Bld: 120 mg/dL — ABNORMAL HIGH (ref 70–99)
Potassium: 4.2 mmol/L (ref 3.5–5.1)
Sodium: 132 mmol/L — ABNORMAL LOW (ref 135–145)

## 2023-01-31 LAB — CBC
HCT: 28.6 % — ABNORMAL LOW (ref 36.0–46.0)
Hemoglobin: 9.1 g/dL — ABNORMAL LOW (ref 12.0–15.0)
MCH: 31.8 pg (ref 26.0–34.0)
MCHC: 31.8 g/dL (ref 30.0–36.0)
MCV: 100 fL (ref 80.0–100.0)
Platelets: 178 10*3/uL (ref 150–400)
RBC: 2.86 MIL/uL — ABNORMAL LOW (ref 3.87–5.11)
RDW: 15.6 % — ABNORMAL HIGH (ref 11.5–15.5)
WBC: 13.6 10*3/uL — ABNORMAL HIGH (ref 4.0–10.5)
nRBC: 0 % (ref 0.0–0.2)

## 2023-01-31 LAB — CULTURE, BLOOD (ROUTINE X 2): Special Requests: ADEQUATE

## 2023-01-31 NOTE — Progress Notes (Signed)
Checked on patient tonight.  No distress noted, patient stating her breathing is much better.  Bipap is PRN order and not needed at this time.

## 2023-01-31 NOTE — Progress Notes (Addendum)
   Please refer to full Cardiology consult note from 01/30/2023. No new Cardiology recommendations today. The patient is scheduled for a TEE on 02/02/2023 at 1400.  Signed, Erma Heritage, PA-C 01/31/2023, 8:35 AM   Due to scheduling changes, TEE will now be on 01/31/2022 at 1400. Vitals currently stable with no requirement for pressor support. Hgb stable at 9.1 and platelets at 178 K. K+ at 4.2. Did test positive for COVID but on 01/09/2023. Keep NPO after midnight.  Signed, Erma Heritage, PA-C 01/31/2023, 10:44 AM

## 2023-01-31 NOTE — Progress Notes (Signed)
Mobility Specialist Progress Note:    01/31/23 1000  Mobility  Activity Ambulated with assistance in room  Level of Assistance Modified independent, requires aide device or extra time  Assistive Device Other (Comment) (HHA)  Distance Ambulated (ft) 62 ft  Activity Response Tolerated well  Mobility Referral Yes  $Mobility charge 1 Mobility   Pt agreeable to mobility session. Tolerated well, asx throughout. Requested assistance to restroom during session. Returned pt to bed, doctor in room, all needs met, call bell in reach.   Royetta Crochet Mobility Specialist Please contact via Solicitor or  Rehab office at [phone]

## 2023-01-31 NOTE — Progress Notes (Signed)
PROGRESS NOTE  Katherine Larson [id number] DOB: Feb 13, 1946 DOA: 01/28/2023 PCP: Harlan Stains, MD  Brief History:  77 year old female with a history of coronary artery disease, hypertension, hyperlipidemia, HFrEF (EF 30-35%) status post ICD, GERD, lymphocytic colitis, and left bundle branch block presenting with 1 day history of shortness of breath.  The patient was diagnosed with COVID on 01/09/2023 and given a 5-day course of Paxlovid.  She finished the course.  She was doing fine until the past 24 hours when she developed worsening shortness of breath.  She denies any fevers, chills, headache, hemoptysis, diarrhea, abdominal pain.  She had an episode of nausea and vomiting.  There is no hematemesis.  Upon EMS arrival, the patient was noted to be hypoxic with saturation in the 80s on room air.  She had difficulty tolerating CPAP and was placed on nonrebreather.  Upon arrival in the emergency department, the patient was placed on BiPAP. In the ED, the patient was afebrile and hemodynamically stable with oxygen saturation 100% on BiPAP.  WBC 15.1, hemoglobin 11.6, platelets 214,000.  Sodium 136, potassium 5.0, bicarbonate 18, serum creatinine 0.95.  LFTs were unremarkable.  Lactic acid was 5.2. troponin 169.  BNP 503.  Chest x-ray showed left lower lobe, right lower lobe, and right middle lobe opacities.  The patient was started on levofloxacin.  She was given 2 L of IV fluid and started on maintenance fluids.   Assessment/Plan: Sepsis -Secondary to pneumonia and bacteremia -Lactic acid 5.2 -Follow blood cultures -Start vancomycin pending culture data -Continue levofloxacin and meropenem -Obtain UA 6-10 WBC -3/10--personally reviewed CXR--L>R lower lobe opacities  Pseudomonas Bacteremia -Multiplex PCR>>Pseudomonas, not resistant to carbapenem -continue levoflox and meropenem. -repeat blood culture -consult cardiology for TEE, with plans for TEE on 02/01/2023. -Continue to follow  infectious disease services.   Lobar pneumonia -Check PCT 7.52>>34.14 -Continue levofloxacin and meropenem -d/c vanc/aztreonam   Acute respiratory failure with hypoxia -Currently on BiPAP>>3L>>2L>>1.5 [place] -Continue to wean as tolerated -Secondary to pneumonia, pulmonary edema   Acute on Chronic HFrEF -12/29/2022 echo EF 30-35%, +WMA, grade 1 DD -Continue the use of carvedilol and continue holding losartan. -Holding Farxiga temporarily in the setting of active infection -Continue current diuretic dose. -Continue to follow daily weights and strict intake and output. -Follow cardiology service recommendation.   CKD stage IIIa -Baseline creatinine 0.9-1.2 -Can sinew to monitor BMP   Mixed hyperlipidemia -Continue statin -Heart healthy diet discussed with patient.   Coronary artery disease -Status post CABG 1996 -Continue aspirin -Continue Lipitor   Elevated troponin -due to demand ischemia most likely -Continue to monitor clinically     Family Communication:  spouse updated 3/12   Consultants:  none   Code Status:  FULL    DVT Prophylaxis:  [place] Lovenox     Procedures: As Listed in Progress Note Above   Antibiotics: Levoflox 3/10, 3/12>> Vanc/aztreonam 3/10 Merrem 3/11>>   Subjective: Afebrile, no chest pain, no nausea or vomiting.  Reports feeling short winded with activity and is requiring 1-1.5 L nasal cannula supplementation.  Objective: Vitals:   01/30/23 1313 01/30/23 2011 01/31/23 0405 01/31/23 1301  BP: 128/62 (!) 106/53 (!) 116/59 116/60  Pulse: 93 96 91 99  Resp: '16 18 18 16  '$ Temp: 99.4 F (37.4 C) 100.3 F (37.9 C) 98.7 F (37.1 C) 98.6 F (37 C)  TempSrc: Oral Oral Oral Oral  SpO2: 96% 93% 96% 97%  Weight:  Height:        Intake/Output Summary (Last 24 hours) at 01/31/2023 1439 Last data filed at 01/31/2023 0500 Gross per 24 hour  Intake 460 ml  Output 925 ml  Net -465 ml   Weight change:  Exam: General exam: Alert, awake,  oriented x 3; 1.5 L nasal cannula supplementation in place; expressing no fever, no nausea, no vomiting, no chest pain. Respiratory system: Positive scattered rhonchi; no wheezing, no crackles, no using accessory muscles. Cardiovascular system:RRR.  No rubs, no gallops, no JVD appreciated on exam. Gastrointestinal system: Abdomen is nondistended, soft and nontender. No organomegaly or masses felt. Normal bowel sounds heard. Central nervous system: Alert and oriented. No focal neurological deficits. Extremities: No cyanosis or clubbing.  No edema. Skin: No petechiae. Psychiatry: Judgement and insight appear normal. Mood & affect appropriate.   Data Reviewed: I have personally reviewed following labs and imaging studies  Basic Metabolic Panel: Recent Labs  Lab 01/28/23 0700 01/29/23 0310 01/30/23 0355 01/31/23 0501  NA 136 138 135 132*  K 5.0 4.5 3.5 4.2  CL 105 109 101 96*  CO2 18* 21* 25 27  GLUCOSE 277* 83 125* 120*  BUN '21 22 20 '$ 24*  CREATININE 0.95 0.82 0.87 0.95  CALCIUM 8.8* 8.2* 8.1* 8.4*  MG  --   --  1.6*  --   PHOS  --   --  2.8  --    Liver Function Tests: Recent Labs  Lab 01/28/23 0700 01/30/23 0355  AST 28 17  ALT 26 18  ALKPHOS 108 85  BILITOT 0.9 0.5  PROT 7.3 6.1*  ALBUMIN 3.6 2.7*   Coagulation Profile: Recent Labs  Lab 01/28/23 0700  INR 1.1   CBC: Recent Labs  Lab 01/28/23 0658 01/29/23 0310 01/30/23 0355 01/31/23 0501  WBC 15.1* 16.1* 15.8* 13.6*  NEUTROABS 11.4*  --   --   --   HGB 11.6* 9.2* 9.0* 9.1*  HCT 36.9 31.0* 29.9* 28.6*  MCV 101.4* 107.3* 103.5* 100.0  PLT 214 142* 163 178   CBG: Recent Labs  Lab 01/28/23 1419 01/28/23 1728  GLUCAP 133* 126*   Urine analysis:    Component Value Date/Time   COLORURINE YELLOW 01/28/2023 1206   APPEARANCEUR HAZY (A) 01/28/2023 1206   LABSPEC 1.020 01/28/2023 1206   PHURINE 5.5 01/28/2023 1206   GLUCOSEU >=500 (A) 01/28/2023 1206   HGBUR NEGATIVE 01/28/2023 1206   BILIRUBINUR  NEGATIVE 01/28/2023 1206   KETONESUR NEGATIVE 01/28/2023 1206   PROTEINUR NEGATIVE 01/28/2023 1206   NITRITE POSITIVE (A) 01/28/2023 1206   LEUKOCYTESUR NEGATIVE 01/28/2023 1206   Sepsis Labs:  Recent Results (from the past 240 hour(s))  Blood culture (routine x 2)     Status: Abnormal   Collection Time: 01/28/23  6:59 AM   Specimen: BLOOD RIGHT WRIST  Result Value Ref Range Status   Specimen Description   Final    BLOOD RIGHT WRIST BOTTLES DRAWN AEROBIC AND ANAEROBIC Performed at [facility], [address] [place], [place] [address]    Special Requests   Final    Blood Culture adequate volume Performed at [facility], [address], [place],  [address]    Culture  Setup Time   Final    GRAM NEGATIVE RODS Gram Stain Report Called to,Read Back By and Verified With: D.SMITH @ 1451 BY STEPHTR 01/29/23 AEROBIC BOTTLE ONLY CRITICAL RESULT CALLED TO, READ BACK BY AND VERIFIED WITH: RN Keturah Barre [facility] [phone] '@1755'$  FH Performed at [facility], [address] [place], [place] [address]    Culture PSEUDOMONAS AERUGINOSA (A)  Final   Report Status 01/31/2023 FINAL  Final   Organism ID, Bacteria PSEUDOMONAS AERUGINOSA  Final      Susceptibility   Pseudomonas aeruginosa - MIC*    CEFTAZIDIME 4 SENSITIVE Sensitive     CIPROFLOXACIN <=0.25 SENSITIVE Sensitive     GENTAMICIN <=1 SENSITIVE Sensitive     IMIPENEM 1 SENSITIVE Sensitive     PIP/TAZO <=4 SENSITIVE Sensitive     CEFEPIME 2 SENSITIVE Sensitive     * PSEUDOMONAS AERUGINOSA  Blood Culture ID Panel (Reflexed)     Status: Abnormal   Collection Time: 01/28/23  6:59 AM  Result Value Ref Range Status   Enterococcus faecalis NOT DETECTED NOT DETECTED Final   Enterococcus Faecium NOT DETECTED NOT DETECTED Final   Listeria monocytogenes NOT DETECTED NOT DETECTED Final   Staphylococcus species NOT DETECTED NOT DETECTED Final   Staphylococcus aureus (BCID) NOT DETECTED NOT DETECTED Final   Staphylococcus epidermidis NOT  DETECTED NOT DETECTED Final   Staphylococcus lugdunensis NOT DETECTED NOT DETECTED Final   Streptococcus species NOT DETECTED NOT DETECTED Final   Streptococcus agalactiae NOT DETECTED NOT DETECTED Final   Streptococcus pneumoniae NOT DETECTED NOT DETECTED Final   Streptococcus pyogenes NOT DETECTED NOT DETECTED Final   A.calcoaceticus-baumannii NOT DETECTED NOT DETECTED Final   Bacteroides fragilis NOT DETECTED NOT DETECTED Final   Enterobacterales NOT DETECTED NOT DETECTED Final   Enterobacter cloacae complex NOT DETECTED NOT DETECTED Final   Escherichia coli NOT DETECTED NOT DETECTED Final   Klebsiella aerogenes NOT DETECTED NOT DETECTED Final   Klebsiella oxytoca NOT DETECTED NOT DETECTED Final   Klebsiella pneumoniae NOT DETECTED NOT DETECTED Final   Proteus species NOT DETECTED NOT DETECTED Final   Salmonella species NOT DETECTED NOT DETECTED Final   Serratia marcescens NOT DETECTED NOT DETECTED Final   Haemophilus influenzae NOT DETECTED NOT DETECTED Final   Neisseria meningitidis NOT DETECTED NOT DETECTED Final   Pseudomonas aeruginosa DETECTED (A) NOT DETECTED Final    Comment: CRITICAL RESULT CALLED TO, READ BACK BY AND VERIFIED WITH: RN D. [place] [phone] '@1755'$  FH    Stenotrophomonas maltophilia NOT DETECTED NOT DETECTED Final   Candida albicans NOT DETECTED NOT DETECTED Final   Candida auris NOT DETECTED NOT DETECTED Final   Candida glabrata NOT DETECTED NOT DETECTED Final   Candida krusei NOT DETECTED NOT DETECTED Final   Candida parapsilosis NOT DETECTED NOT DETECTED Final   Candida tropicalis NOT DETECTED NOT DETECTED Final   Cryptococcus neoformans/gattii NOT DETECTED NOT DETECTED Final   CTX-M ESBL NOT DETECTED NOT DETECTED Final   Carbapenem resistance IMP NOT DETECTED NOT DETECTED Final   Carbapenem resistance KPC NOT DETECTED NOT DETECTED Final   Carbapenem resistance NDM NOT DETECTED NOT DETECTED Final   Carbapenem resistance VIM NOT DETECTED NOT DETECTED Final     Comment: Performed at [facility], [place] [address] [place], [place] [address]  Blood culture (routine x 2)     Status: None (Preliminary result)   Collection Time: 01/28/23  7:04 AM   Specimen: BLOOD RIGHT FOREARM  Result Value Ref Range Status   Specimen Description   Final    BLOOD RIGHT FOREARM BOTTLES DRAWN AEROBIC AND ANAEROBIC Performed at [facility], [address], [place], [place] [address]    Special Requests   Final    Blood Culture adequate volume Performed at [facility], [address], [place], [place]  [address]    Culture   Final    NO GROWTH 3 DAYS Performed at [facility], [place] [address] [place], [place] [address]    Report Status PENDING  Incomplete  Resp panel by RT-PCR (RSV, Flu A&B, Covid) Anterior Nasal Swab     Status: Abnormal   Collection Time: 01/28/23  8:11 AM   Specimen: Anterior Nasal Swab  Result Value Ref Range Status   SARS Coronavirus 2 by RT PCR POSITIVE (A) NEGATIVE Final    Comment: (NOTE) SARS-CoV-2 target nucleic acids are DETECTED.  The SARS-CoV-2 RNA is generally detectable in upper respiratory specimens during the acute phase of infection. Positive results are indicative of the presence of the identified virus, but do not rule out bacterial infection or co-infection with other pathogens not detected by the test. Clinical correlation with patient history and other diagnostic information is necessary to determine patient infection status. The expected result is Negative.  Fact Sheet for Patients: EntrepreneurPulse.com.au  Fact Sheet for Healthcare Providers: IncredibleEmployment.be  This test is not yet approved or cleared by the [place] FDA and  has been authorized for detection and/or diagnosis of SARS-CoV-2 by FDA under an Emergency Use Authorization (EUA).  This EUA will remain in effect (meaning this test can be used) for the duration of  the COVID-19 declaration under  Section 564(b)(1) of the A ct, 21 U.S.C. section 360bbb-3(b)(1), unless the authorization is terminated or revoked sooner.     Influenza A by PCR NEGATIVE NEGATIVE Final   Influenza B by PCR NEGATIVE NEGATIVE Final    Comment: (NOTE) The Xpert Xpress SARS-CoV-2/FLU/RSV plus assay is intended as an aid in the diagnosis of influenza from Nasopharyngeal swab specimens and should not be used as a sole basis for treatment. Nasal washings and aspirates are unacceptable for Xpert Xpress SARS-CoV-2/FLU/RSV testing.  Fact Sheet for Patients: EntrepreneurPulse.com.au  Fact Sheet for Healthcare Providers: IncredibleEmployment.be  This test is not yet approved or cleared by the [place] FDA and has been authorized for detection and/or diagnosis of SARS-CoV-2 by FDA under an Emergency Use Authorization (EUA). This EUA will remain in effect (meaning this test can be used) for the duration of the COVID-19 declaration under Section 564(b)(1) of the Act, 21 U.S.C. section 360bbb-3(b)(1), unless the authorization is terminated or revoked.     Resp Syncytial Virus by PCR NEGATIVE NEGATIVE Final    Comment: (NOTE) Fact Sheet for Patients: EntrepreneurPulse.com.au  Fact Sheet for Healthcare Providers: IncredibleEmployment.be  This test is not yet approved or cleared by the [place] FDA and has been authorized for detection and/or diagnosis of SARS-CoV-2 by FDA under an Emergency Use Authorization (EUA). This EUA will remain in effect (meaning this test can be used) for the duration of the COVID-19 declaration under Section 564(b)(1) of the Act, 21 U.S.C. section 360bbb-3(b)(1), unless the authorization is terminated or revoked.  Performed at [facility], [address] [place], [place] [address]   MRSA Next Gen by PCR, Nasal     Status: None   Collection Time: 01/28/23 10:09 AM   Specimen: Nasal Mucosa; Nasal  Swab  Result Value Ref Range Status   MRSA by PCR Next Gen NOT DETECTED NOT DETECTED Final    Comment: (NOTE) The GeneXpert MRSA Assay (FDA approved for NASAL specimens only), is one component of a comprehensive MRSA colonization surveillance program. It is not intended to diagnose MRSA infection nor to guide or monitor treatment for MRSA infections. Test performance is not  FDA approved in patients less than 33 years old. Performed at [facility], [address], [place], [place] [address]   Culture, blood (Routine X 2) w Reflex to ID Panel     Status: None (Preliminary result)   Collection Time: 01/30/23  8:06 AM   Specimen: BLOOD  Result Value Ref Range Status   Specimen Description   Final    BLOOD LEFT ASSIST CONTROL Performed at [facility], [address], [place], [place] [address]    Special Requests   Final    BOTTLES DRAWN AEROBIC AND ANAEROBIC Blood Culture results may not be optimal due to an excessive volume of blood received in culture bottles Performed at [facility], [address] [place], [place] [address]    Culture   Final    NO GROWTH < 24 HOURS Performed at [place] [address], [place], [place] [address]    Report Status PENDING  Incomplete  Culture, blood (Routine X 2) w Reflex to ID Panel     Status: None (Preliminary result)   Collection Time: 01/30/23  8:08 AM   Specimen: BLOOD  Result Value Ref Range Status   Specimen Description   Final    BLOOD RIGHT HAND Performed at [facility], [address], [place], [place] [address]    Special Requests   Final    BOTTLES DRAWN AEROBIC AND ANAEROBIC Blood Culture adequate volume Performed at [facility], [address] [place], [place] [address]    Culture   Final    NO GROWTH < 24 HOURS Performed at [facility], [place] [address], [place],  [address]    Report Status PENDING  Incomplete     Scheduled Meds:  aspirin EC  81 mg Oral Daily   atorvastatin  80 mg Oral Daily    carvedilol  6.25 mg Oral BID WC   Chlorhexidine Gluconate Cloth  6 each Topical Daily   enoxaparin (LOVENOX) injection  40 mg Subcutaneous Q24H   furosemide  40 mg Oral BID   gabapentin  300 mg Oral TID   omega-3 acid ethyl esters  1 g Oral BID   mouth rinse  15 mL Mouth Rinse 4 times per day   potassium chloride  20 mEq Oral Daily   Continuous Infusions:  ceFEPime (MAXIPIME) IV 2 g (01/31/23 1340)   Procedures/Studies: DG CHEST PORT 1 VIEW  Result Date: 01/30/2023 CLINICAL DATA:  Pneumonia, CHF, pulmonary edema, COPD, open heart surgery EXAM: PORTABLE CHEST 1 VIEW COMPARISON:  Portable exam 0811 hours compared to 01/28/2023 FINDINGS: Intraspinal stimulator and LEFT subclavian ICD unchanged. Enlargement of cardiac silhouette post median sternotomy. Atherosclerotic calcification aorta. Pulmonary vascular congestion with asymmetric pulmonary infiltrates greater on LEFT. This likely represents a combination of mild pulmonary edema and superimposed LEFT lung consolidation/pneumonia. Minimal LEFT pleural effusion without pneumothorax. Bones demineralized. IMPRESSION: Enlargement of cardiac silhouette with probable pulmonary edema and superimposed LEFT lung consolidation/pneumonia. Aortic Atherosclerosis (ICD10-I70.0). Electronically Signed   By: Lavonia Dana M.D.   On: 01/30/2023 08:26   DG Chest Port 1 View  Result Date: 01/28/2023 CLINICAL DATA:  Shortness of breath. EXAM: PORTABLE CHEST 1 VIEW COMPARISON:  PA Lat chest 10/04/2022, CTA chest 10/05/2022 FINDINGS: 7:12 a.m. There are partially translucent electrical pads overlying the left mid to lower lung field. Left-sided single lead pacer/AID and wire insertion are unaltered. 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 Stable mild cardiomegaly.  The central vessels are normal caliber. There is patchy consolidation in the  left lower lung field and scattered interstitial and airspace opacities of the right mid and lower lung, findings consistent with multilobar  pneumonia. There is no substantial pleural effusion. The mediastinum is normally outlined. There is aortic calcific plaque. Osteopenia and mild thoracic dextroscoliosis. IMPRESSION: Patchy consolidation in the left lower lung field and scattered interstitial and airspace opacities of the right mid and lower lung field, findings consistent with multilobar pneumonia. Clinical correlation and radiographic follow-up recommended. Electrical pads overlying the left chest. Electronically Signed   By: Telford Nab M.D.   On: 01/28/2023 07:31   DG BONE DENSITY (DXA)  Result Date: 01/17/2023 EXAM: DUAL X-RAY ABSORPTIOMETRY (DXA) FOR BONE MINERAL DENSITY IMPRESSION: Referring Physician:  [place] Your patient completed a bone mineral density test using GE Lunar iDXA system (analysis version: 16). Technologist: lmn PATIENT: Name: Rether, Isaiah Patient ID: [id number] Birth Date: [birth year] Height: 62.5 in. Sex: Female Measured: 01/17/2023 Weight: 129.2 lbs. Indications: Advanced Age, Caucasian, COPD, Diabetic non insulin, Gabapentin, Height Loss (781.91), Hysterectomy, One ovary removed, Postmenopausal, Prevacid, Secondary Osteoporosis Fractures: None Treatments: Vitamin D (E933.5) ASSESSMENT: The BMD measured at Femur Neck Right is 0.766 g/cm2 with a T-score of -2.0. This patient is considered osteopenic/low bone mass according to [facility]) criteria. The quality of the exam is good. The lumbar spine was excluded due to degenerative changes as noted on previous exam. Site Region Measured Date Measured Age YA BMD Significant CHANGE T-score DualFemur Neck Right 01/17/2023 75.1 -2.0 0.766 g/cm2 DualFemur Neck Right 06/03/2019 71.5 -2.1 0.742 g/cm2 DualFemur Total Mean 01/17/2023 75.1 -1.0 0.887 g/cm2 * DualFemur Total Mean 06/03/2019 71.5 -0.1 1.001 g/cm2 Left Forearm Radius 33% 01/17/2023 75.1 -1.2 0.774 g/cm2 * Left Forearm Radius 33% 06/03/2019 71.5 -0.2 0.868 g/cm2 World Health Organization  [facility]) criteria for post-menopausal, Caucasian Women: Normal       T-score at or above -1 SD Osteopenia   T-score between -1 and -2.5 SD Osteoporosis T-score at or below -2.5 SD RECOMMENDATION: 1. All patients should optimize calcium and vitamin D intake. 2. Consider FDA-approved medical therapies in postmenopausal women and men aged 17 years and older, based on the following: a. A hip or vertebral (clinical or morphometric) fracture. b. T-score = -2.5 at the femoral neck or spine after appropriate evaluation to exclude secondary causes. c. Low bone mass (T-score between -1.0 and -2.5 at the femoral neck or spine) and a 10-year probability of a hip fracture = 3% or a 10-year probability of a major osteoporosis-related fracture = 20% based on the [place]-adapted WHO algorithm. d. Clinician judgment and/or patient preferences may indicate treatment for people with 10-year fracture probabilities above or below these levels. FOLLOW-UP: Patients with diagnosis of osteoporosis or at high risk for fracture should have regular bone mineral density tests.? Patients eligible for Medicare are allowed routine testing every 2 years.? The testing frequency can be increased to one year for patients who have rapidly progressing disease, are receiving or discontinuing medical therapy to restore bone mass, or have additional risk factors. I have reviewed this study and agree with the findings. Mark A. Thornton Papas, M.D. [facility] FRAX* 10-year Probability of Fracture Based on femoral neck BMD: DualFemur (Right) Major Osteoporotic Fracture: 12.5% Hip Fracture:                3.3% Population:                  [place] (Caucasian) Risk Factors:  Secondary Osteoporosis *FRAX is a Materials engineer of the [employer] for Metabolic Bone Disease, a World Pharmacologist (WHO) [facility]. ASSESSMENT: The probability of a major osteoporotic fracture is 12.5% within the next ten years. The  probability of a hip fracture is 3.3% within the next ten years. I have reviewed this report and agree with the above findings. Mark A. Thornton Papas, M.D. [facility] Electronically Signed   By: Lavonia Dana M.D.   On: 01/17/2023 12:04    Barton Dubois, MD  [facility]  If 7PM-7AM, please contact night-coverage [email or web address] Password [facility] 01/31/2023, 2:39 PM   LOS: 3 days

## 2023-02-01 ENCOUNTER — Inpatient Hospital Stay (HOSPITAL_COMMUNITY): Payer: Medicare Other | Admitting: Certified Registered Nurse Anesthetist

## 2023-02-01 ENCOUNTER — Inpatient Hospital Stay (HOSPITAL_COMMUNITY): Payer: Medicare Other

## 2023-02-01 ENCOUNTER — Other Ambulatory Visit (HOSPITAL_COMMUNITY): Payer: Self-pay | Admitting: *Deleted

## 2023-02-01 ENCOUNTER — Encounter (HOSPITAL_COMMUNITY): Payer: Self-pay | Admitting: Internal Medicine

## 2023-02-01 ENCOUNTER — Encounter (HOSPITAL_COMMUNITY)
Admission: EM | Disposition: A | Discharge status: Home / Self Care | Payer: Self-pay | Source: Home / Self Care | Attending: Internal Medicine

## 2023-02-01 DIAGNOSIS — J9601 Acute respiratory failure with hypoxia: Secondary | ICD-10-CM | POA: Diagnosis not present

## 2023-02-01 DIAGNOSIS — I110 Hypertensive heart disease with heart failure: Secondary | ICD-10-CM

## 2023-02-01 DIAGNOSIS — R7881 Bacteremia: Secondary | ICD-10-CM

## 2023-02-01 DIAGNOSIS — I5022 Chronic systolic (congestive) heart failure: Secondary | ICD-10-CM | POA: Diagnosis not present

## 2023-02-01 DIAGNOSIS — I340 Nonrheumatic mitral (valve) insufficiency: Secondary | ICD-10-CM

## 2023-02-01 DIAGNOSIS — I447 Left bundle-branch block, unspecified: Secondary | ICD-10-CM | POA: Diagnosis not present

## 2023-02-01 DIAGNOSIS — I252 Old myocardial infarction: Secondary | ICD-10-CM

## 2023-02-01 DIAGNOSIS — I509 Heart failure, unspecified: Secondary | ICD-10-CM

## 2023-02-01 DIAGNOSIS — Z87891 Personal history of nicotine dependence: Secondary | ICD-10-CM

## 2023-02-01 DIAGNOSIS — A419 Sepsis, unspecified organism: Secondary | ICD-10-CM | POA: Diagnosis not present

## 2023-02-01 DIAGNOSIS — I2510 Atherosclerotic heart disease of native coronary artery without angina pectoris: Secondary | ICD-10-CM

## 2023-02-01 HISTORY — PX: TEE WITHOUT CARDIOVERSION: SHX5443

## 2023-02-01 LAB — ECHO TEE

## 2023-02-01 SURGERY — ECHOCARDIOGRAM, TRANSESOPHAGEAL
Anesthesia: General

## 2023-02-01 MED ORDER — PROPOFOL 10 MG/ML IV BOLUS
INTRAVENOUS | Status: DC | PRN
  Administered 2023-02-01 (×3): 20 mg via INTRAVENOUS

## 2023-02-01 MED ORDER — LIDOCAINE HCL (PF) 2 % IJ SOLN
INTRAMUSCULAR | Status: AC
  Filled 2023-02-01: qty 5

## 2023-02-01 MED ORDER — LIDOCAINE HCL (CARDIAC) PF 100 MG/5ML IV SOSY
PREFILLED_SYRINGE | INTRAVENOUS | Status: DC | PRN
  Administered 2023-02-01: 20 mg via INTRAVENOUS

## 2023-02-01 MED ORDER — PHENYLEPHRINE 80 MCG/ML (10ML) SYRINGE FOR IV PUSH (FOR BLOOD PRESSURE SUPPORT)
PREFILLED_SYRINGE | INTRAVENOUS | Status: DC | PRN
  Administered 2023-02-01: 80 ug via INTRAVENOUS

## 2023-02-01 MED ORDER — BUTAMBEN-TETRACAINE-BENZOCAINE 2-2-14 % EX AERO
INHALATION_SPRAY | CUTANEOUS | Status: AC
  Filled 2023-02-01: qty 5

## 2023-02-01 MED ORDER — PROPOFOL 500 MG/50ML IV EMUL
INTRAVENOUS | Status: DC | PRN
  Administered 2023-02-01: 30 ug/kg/min via INTRAVENOUS

## 2023-02-01 MED ORDER — BUTAMBEN-TETRACAINE-BENZOCAINE 2-2-14 % EX AERO
INHALATION_SPRAY | CUTANEOUS | Status: DC | PRN
  Administered 2023-02-01: 150 via TOPICAL
  Administered 2023-02-01: 2 via TOPICAL

## 2023-02-01 MED ORDER — LACTATED RINGERS IV SOLN: INTRAVENOUS | Status: DC | PRN

## 2023-02-01 MED ORDER — OXYCODONE HCL 5 MG PO TABS
5.0000 mg | ORAL_TABLET | Freq: Once | ORAL | Status: AC
  Administered 2023-02-01: 5 mg via ORAL
  Filled 2023-02-01: qty 1

## 2023-02-01 MED ORDER — PHENYLEPHRINE 80 MCG/ML (10ML) SYRINGE FOR IV PUSH (FOR BLOOD PRESSURE SUPPORT)
PREFILLED_SYRINGE | INTRAVENOUS | Status: AC
  Filled 2023-02-01: qty 10

## 2023-02-01 NOTE — Progress Notes (Addendum)
[facility] Infectious Diseases Follow Up Note  Patient Identification: Patient Name: Katherine Larson MRN: [id number] Admit Date: 01/28/2023  6:59 AM Age: 77 y.o.Today's Date: 02/01/2023  Reason for Visit: PsA bacteremia   Principal Problem:   Acute respiratory failure with hypoxia [facility]) Active Problems:   Sepsis due to undetermined organism [facility])   LBBB (left bundle branch block)   Chronic HFrEF (heart failure with reduced ejection fraction) (HCC)   Lobar pneumonia (HCC)   Pseudomonas sepsis (HCC)  Antibiotics:  Vancomycin 3/10 Aztreonam 3/10 Levofloxacin 3/10, 3/12, Meropenem 3/11, cefepime 3/12-c   Lines/Hardware: ICD and spinal cord stimulator  Interval Events: afebrile in the last 24 hrs  Assessment # PsA bacteremia likely 2/2  - repeat blood cx 3/12 NG in 2 days   # Multifocal pna with acute hypoxic respiratory failure  # Recent COVID - CT 29, unclear if resolving from last infection or re- infection. Unclear benefit for antiviral tx with multiple confounding factors for respiratory symptoms. # Acute on chronic CHFrEF s/p ICD - Cardiology managing # Spinal cord stimulator - no issues reported     Recommendations OK to switch to ciprofloxacin 750 mg po bid on discharge to complete 2 weeks course. EOT 02/12/23 Recommend to repeat 2 sets of blood cultures as surveillance after 2 weeks of completion of abtx course Isolation precautions per IP protocol ID will SO, please call with questions   Addendum: noted to have qtc 524, repeat EKG to see if still elevated. If persistently elevated more than 500 and plan to switch to ciprofloxacin from cefepime, would have Cardiology opine regarding prolonged QTC and Ciprofloxacin use for remainder of tx. Please call us if any questions. D/w Dr Dyann Kief.   Rest of the management as per the primary team. Thank you for the consult. Please page with pertinent questions or  concerns.  ______________________________________________________________________ Subjective Remote tele consult Unable to do physical exam   Vitals BP (!) 114/56 (BP Location: Right Arm)   Pulse 87   Temp 97.7 F (36.5 C)   Resp 18   Ht 5' 2.5" (1.588 m)   Wt 60.7 kg   SpO2 98%   BMI 24.09 kg/m    Pertinent Microbiology Results for orders placed or performed during the hospital encounter of 01/28/23  Blood culture (routine x 2)     Status: Abnormal   Collection Time: 01/28/23  6:59 AM   Specimen: BLOOD RIGHT WRIST  Result Value Ref Range Status   Specimen Description   Final    BLOOD RIGHT WRIST BOTTLES DRAWN AEROBIC AND ANAEROBIC Performed at [facility], [address], [place], [place] [address]    Special Requests   Final    Blood Culture adequate volume Performed at [facility], [address] [place], [place] [address]    Culture  Setup Time   Final    GRAM NEGATIVE RODS Gram Stain Report Called to,Read Back By and Verified With: D.SMITH @ 1451 BY STEPHTR 01/29/23 AEROBIC BOTTLE ONLY CRITICAL RESULT CALLED TO, READ BACK BY AND VERIFIED WITH: RN D. [place] [phone] @1755  FH Performed at [facility], [place] [address], [place], [place] [address]    Culture PSEUDOMONAS AERUGINOSA (A)  Final   Report Status 01/31/2023 FINAL  Final   Organism ID, Bacteria PSEUDOMONAS AERUGINOSA  Final      Susceptibility   Pseudomonas aeruginosa - MIC*    CEFTAZIDIME 4 SENSITIVE Sensitive     CIPROFLOXACIN <=0.25 SENSITIVE Sensitive     GENTAMICIN <=  1 SENSITIVE Sensitive     IMIPENEM 1 SENSITIVE Sensitive     PIP/TAZO <=4 SENSITIVE Sensitive     CEFEPIME 2 SENSITIVE Sensitive     * PSEUDOMONAS AERUGINOSA  Blood Culture ID Panel (Reflexed)     Status: Abnormal   Collection Time: 01/28/23  6:59 AM  Result Value Ref Range Status   Enterococcus faecalis NOT DETECTED NOT DETECTED Final   Enterococcus Faecium NOT DETECTED NOT DETECTED Final   Listeria monocytogenes NOT DETECTED  NOT DETECTED Final   Staphylococcus species NOT DETECTED NOT DETECTED Final   Staphylococcus aureus (BCID) NOT DETECTED NOT DETECTED Final   Staphylococcus epidermidis NOT DETECTED NOT DETECTED Final   Staphylococcus lugdunensis NOT DETECTED NOT DETECTED Final   Streptococcus species NOT DETECTED NOT DETECTED Final   Streptococcus agalactiae NOT DETECTED NOT DETECTED Final   Streptococcus pneumoniae NOT DETECTED NOT DETECTED Final   Streptococcus pyogenes NOT DETECTED NOT DETECTED Final   A.calcoaceticus-baumannii NOT DETECTED NOT DETECTED Final   Bacteroides fragilis NOT DETECTED NOT DETECTED Final   Enterobacterales NOT DETECTED NOT DETECTED Final   Enterobacter cloacae complex NOT DETECTED NOT DETECTED Final   Escherichia coli NOT DETECTED NOT DETECTED Final   Klebsiella aerogenes NOT DETECTED NOT DETECTED Final   Klebsiella oxytoca NOT DETECTED NOT DETECTED Final   Klebsiella pneumoniae NOT DETECTED NOT DETECTED Final   Proteus species NOT DETECTED NOT DETECTED Final   Salmonella species NOT DETECTED NOT DETECTED Final   Serratia marcescens NOT DETECTED NOT DETECTED Final   Haemophilus influenzae NOT DETECTED NOT DETECTED Final   Neisseria meningitidis NOT DETECTED NOT DETECTED Final   Pseudomonas aeruginosa DETECTED (A) NOT DETECTED Final    Comment: CRITICAL RESULT CALLED TO, READ BACK BY AND VERIFIED WITH: RN D. [place] [phone] @1755  FH    Stenotrophomonas maltophilia NOT DETECTED NOT DETECTED Final   Candida albicans NOT DETECTED NOT DETECTED Final   Candida auris NOT DETECTED NOT DETECTED Final   Candida glabrata NOT DETECTED NOT DETECTED Final   Candida krusei NOT DETECTED NOT DETECTED Final   Candida parapsilosis NOT DETECTED NOT DETECTED Final   Candida tropicalis NOT DETECTED NOT DETECTED Final   Cryptococcus neoformans/gattii NOT DETECTED NOT DETECTED Final   CTX-M ESBL NOT DETECTED NOT DETECTED Final   Carbapenem resistance IMP NOT DETECTED NOT DETECTED Final    Carbapenem resistance KPC NOT DETECTED NOT DETECTED Final   Carbapenem resistance NDM NOT DETECTED NOT DETECTED Final   Carbapenem resistance VIM NOT DETECTED NOT DETECTED Final    Comment: Performed at [facility], [address] [place], [place] [address]  Blood culture (routine x 2)     Status: None (Preliminary result)   Collection Time: 01/28/23  7:04 AM   Specimen: BLOOD RIGHT FOREARM  Result Value Ref Range Status   Specimen Description   Final    BLOOD RIGHT FOREARM BOTTLES DRAWN AEROBIC AND ANAEROBIC   Special Requests Blood Culture adequate volume  Final   Culture   Final    NO GROWTH 4 DAYS Performed at [facility], [address] [place], [place] [address]    Report Status PENDING  Incomplete  Resp panel by RT-PCR (RSV, Flu A&B, Covid) Anterior Nasal Swab     Status: Abnormal   Collection Time: 01/28/23  8:11 AM   Specimen: Anterior Nasal Swab  Result Value Ref Range Status   SARS Coronavirus 2 by RT PCR POSITIVE (A) NEGATIVE Final    Comment: (NOTE) SARS-CoV-2 target nucleic acids are  DETECTED.  The SARS-CoV-2 RNA is generally detectable in upper respiratory specimens during the acute phase of infection. Positive results are indicative of the presence of the identified virus, but do not rule out bacterial infection or co-infection with other pathogens not detected by the test. Clinical correlation with patient history and other diagnostic information is necessary to determine patient infection status. The expected result is Negative.  Fact Sheet for Patients: EntrepreneurPulse.com.au  Fact Sheet for Healthcare Providers: IncredibleEmployment.be  This test is not yet approved or cleared by the [place] FDA and  has been authorized for detection and/or diagnosis of SARS-CoV-2 by FDA under an Emergency Use Authorization (EUA).  This EUA will remain in effect (meaning this test can be used) for the duration of  the  COVID-19 declaration under Section 564(b)(1) of the A ct, 21 U.S.C. section 360bbb-3(b)(1), unless the authorization is terminated or revoked sooner.     Influenza A by PCR NEGATIVE NEGATIVE Final   Influenza B by PCR NEGATIVE NEGATIVE Final    Comment: (NOTE) The Xpert Xpress SARS-CoV-2/FLU/RSV plus assay is intended as an aid in the diagnosis of influenza from Nasopharyngeal swab specimens and should not be used as a sole basis for treatment. Nasal washings and aspirates are unacceptable for Xpert Xpress SARS-CoV-2/FLU/RSV testing.  Fact Sheet for Patients: EntrepreneurPulse.com.au  Fact Sheet for Healthcare Providers: IncredibleEmployment.be  This test is not yet approved or cleared by the [place] FDA and has been authorized for detection and/or diagnosis of SARS-CoV-2 by FDA under an Emergency Use Authorization (EUA). This EUA will remain in effect (meaning this test can be used) for the duration of the COVID-19 declaration under Section 564(b)(1) of the Act, 21 U.S.C. section 360bbb-3(b)(1), unless the authorization is terminated or revoked.     Resp Syncytial Virus by PCR NEGATIVE NEGATIVE Final    Comment: (NOTE) Fact Sheet for Patients: EntrepreneurPulse.com.au  Fact Sheet for Healthcare Providers: IncredibleEmployment.be  This test is not yet approved or cleared by the [place] FDA and has been authorized for detection and/or diagnosis of SARS-CoV-2 by FDA under an Emergency Use Authorization (EUA). This EUA will remain in effect (meaning this test can be used) for the duration of the COVID-19 declaration under Section 564(b)(1) of the Act, 21 U.S.C. section 360bbb-3(b)(1), unless the authorization is terminated or revoked.  Performed at [facility], [address] [place], [place] [address]   MRSA Next Gen by PCR, Nasal     Status: None   Collection Time: 01/28/23 10:09 AM    Specimen: Nasal Mucosa; Nasal Swab  Result Value Ref Range Status   MRSA by PCR Next Gen NOT DETECTED NOT DETECTED Final    Comment: (NOTE) The GeneXpert MRSA Assay (FDA approved for NASAL specimens only), is one component of a comprehensive MRSA colonization surveillance program. It is not intended to diagnose MRSA infection nor to guide or monitor treatment for MRSA infections. Test performance is not FDA approved in patients less than 76 years old. Performed at [facility], [address] [place], [place] [address]   Culture, blood (Routine X 2) w Reflex to ID Panel     Status: None (Preliminary result)   Collection Time: 01/30/23  8:06 AM   Specimen: BLOOD  Result Value Ref Range Status   Specimen Description BLOOD LEFT ASSIST CONTROL  Final   Special Requests   Final    BOTTLES DRAWN AEROBIC AND ANAEROBIC Blood Culture results may not be optimal due to an excessive volume of  blood received in culture bottles   Culture   Final    NO GROWTH 2 DAYS Performed at [facility], [address] [place], [place] [address]    Report Status PENDING  Incomplete  Culture, blood (Routine X 2) w Reflex to ID Panel     Status: None (Preliminary result)   Collection Time: 01/30/23  8:08 AM   Specimen: BLOOD  Result Value Ref Range Status   Specimen Description BLOOD RIGHT HAND  Final   Special Requests   Final    BOTTLES DRAWN AEROBIC AND ANAEROBIC Blood Culture adequate volume   Culture   Final    NO GROWTH 2 DAYS Performed at [facility], [address], [place], [place] [address]    Report Status PENDING  Incomplete    Pertinent Lab.    Latest Ref Rng & Units 01/31/2023    5:01 AM 01/30/2023    3:55 AM 01/29/2023    3:10 AM  CBC  WBC 4.0 - 10.5 K/uL 13.6  15.8  16.1   Hemoglobin 12.0 - 15.0 g/dL 9.1  9.0  9.2   Hematocrit 36.0 - 46.0 % 28.6  29.9  31.0   Platelets 150 - 400 K/uL 178  163  142       Latest Ref Rng & Units 01/31/2023    5:01 AM 01/30/2023    3:55 AM 01/29/2023     3:10 AM  CMP  Glucose 70 - 99 mg/dL 120  125  83   BUN 8 - 23 mg/dL 24  20  22    Creatinine 0.44 - 1.00 mg/dL 0.95  0.87  0.82   Sodium 135 - 145 mmol/L 132  135  138   Potassium 3.5 - 5.1 mmol/L 4.2  3.5  4.5   Chloride 98 - 111 mmol/L 96  101  109   CO2 22 - 32 mmol/L 27  25  21    Calcium 8.9 - 10.3 mg/dL 8.4  8.1  8.2   Total Protein 6.5 - 8.1 g/dL  6.1    Total Bilirubin 0.3 - 1.2 mg/dL  0.5    Alkaline Phos 38 - 126 U/L  85    AST 15 - 41 U/L  17    ALT 0 - 44 U/L  18       Pertinent Imaging today Plain films and CT images have been personally visualized and interpreted; radiology reports have been reviewed. Decision making incorporated into the Impression / Recommendations.  ECHO TEE  Result Date: 02/01/2023    TRANSESOPHOGEAL ECHO REPORT   Patient Name:   WILLIDEAN STAGNITTA Date of Exam: 02/01/2023 Medical Rec #:  [id number]       Height:       62.5 in Accession #:    [id number]      Weight:       133.8 lb Date of Birth:  Aug 22, 1946      BSA:          1.621 m Patient Age:    89 years        BP:           117/54 mmHg Patient Gender: F               HR:           95 bpm. Exam Location:  [name] Procedure: Transesophageal Echo, Cardiac Doppler and Color Doppler Indications:    Bacteremia R78.81  History:        Patient  has prior history of Echocardiogram examinations, most                 recent 12/29/2022. CHF, CAD and Previous Myocardial Infarction,                 Prior CABG, COPD, Arrythmias:Atrial Flutter and LBBB; Risk                 Factors:Diabetes, Hypertension and Dyslipidemia. Chronic HFrEF                 (heart failure with reduced ejection fraction) ([place]).  Sonographer:    Alvino Chapel RCS Referring Phys: [id number] [place]: TEE procedure time was 11 minutes. The transesophogeal probe was passed without difficulty through the esophogus of the patient. Imaged were obtained with the patient in a left lateral decubitus position. Local oropharyngeal anesthetic  was provided with Cetacaine. Sedation performed by different physician. The patient was monitored while under deep sedation. Image quality was good. The patient developed no complications during the procedure.  IMPRESSIONS  1. Left ventricular ejection fraction, by estimation, is 30 to 35%. The left ventricle has moderately decreased function. The left ventricle demonstrates regional wall motion abnormalities (see scoring diagram/findings for description). The left ventricular internal cavity size was mildly dilated.  2. Right ventricular systolic function is normal. The right ventricular size is normal.  3. Left atrial size was mild to moderately dilated. No left atrial/left atrial appendage thrombus was detected. The LAA emptying velocity was 50 cm/s.  4. The mitral valve is degenerative. Mild mitral valve regurgitation. Moderate mitral annular calcification.  5. The aortic valve is tricuspid. Aortic valve regurgitation is trivial.  6. 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  7. No valvular vegetations.  8. Device wire visulaized in RA and RV, no obvious vegetations. FINDINGS  Left Ventricle: Left ventricular ejection fraction, by estimation, is 30 to 35%. The left ventricle has moderately decreased function. The left ventricle demonstrates regional wall motion abnormalities. The left ventricular internal cavity size was mildly dilated.  LV Wall Scoring: The entire septum is akinetic. The mid and distal lateral wall, mid anterolateral segment, and apex are hypokinetic. The basal inferolateral segment and basal anterolateral segment are normal. Right Ventricle: The right ventricular size is normal. No increase in right ventricular wall thickness. Right ventricular systolic function is normal. Left Atrium: Left atrial size was mild to moderately dilated. Spontaneous echo contrast was present in the left atrium and left atrial appendage. No left atrial/left atrial appendage thrombus was  detected. The LAA emptying velocity was 50 cm/s. Right Atrium: Right atrial size was normal in size. Pericardium: The pericardium was not well visualized. Mitral Valve: The mitral valve is degenerative in appearance. There is mild thickening of the mitral valve leaflet(s). There is mild calcification of the mitral valve leaflet(s). Moderate mitral annular calcification. Mild mitral valve regurgitation. Tricuspid Valve: The tricuspid valve is grossly normal. Tricuspid valve regurgitation is trivial. Aortic Valve: The aortic valve is tricuspid. Aortic valve regurgitation is trivial. Pulmonic Valve: The pulmonic valve was grossly normal. Pulmonic valve regurgitation is trivial. Aorta: The aortic root is normal in size and structure. There is moderate (Grade III) plaque involving the descending aorta. IAS/Shunts: No atrial level shunt detected by color flow Doppler. Additional Comments: A device lead is visualized. Rozann Lesches MD Electronically signed by Rozann Lesches MD Signature Date/Time: 02/01/2023/5:47:17 PM    Final      I spent 55  minutes  for this patient encounter including review of prior medical records, coordination of care with primary/other specialist with greater than 50% of time being face to face/counseling and discussing diagnostics/treatment plan with the patient/family.  Electronically signed by:   Rosiland Oz, MD Infectious Disease Physician [facility] for Infectious Disease Pager: [phone]

## 2023-02-01 NOTE — Progress Notes (Signed)
1550 patient brought to END 3 and prepared for TEE, consent on chart, Anesthesia has seen. 1630 Cardiology at bedside patient positioned on left side and time out complete with agreement 1655 to Recovery phase patient tolerated procedure well see flowsheet

## 2023-02-01 NOTE — Transfer of Care (Signed)
Immediate Anesthesia Transfer of Care Note  Patient: Katherine Larson  Procedure(s) Performed: TRANSESOPHAGEAL ECHOCARDIOGRAM (TEE)  Patient Location: PACU  Anesthesia Type:General  Level of Consciousness: drowsy  Airway & Oxygen Therapy: Patient Spontanous Breathing and Patient connected to nasal cannula oxygen  Post-op Assessment: Report given to RN and Post -op Vital signs reviewed and stable  Post vital signs: Reviewed and stable  Last Vitals:  Vitals Value Taken Time  BP 104/66   Temp    Pulse 88   Resp 18   SpO2 93%     Last Pain:  Vitals:   02/01/23 1616  TempSrc: Oral  PainSc: 0-No pain      Patients Stated Pain Goal: 2 ([id number])  Complications: No notable events documented.

## 2023-02-01 NOTE — Progress Notes (Signed)
Pt reported severe pain, PRN oxycodone given. Pt slept through the night. Vitals stable.

## 2023-02-01 NOTE — Anesthesia Preprocedure Evaluation (Signed)
Anesthesia Evaluation  Patient identified by MRN, date of birth, ID band Patient awake    Reviewed: Allergy & Precautions, H&P , NPO status , Patient's Chart, lab work & pertinent test results, reviewed documented beta blocker date and time   History of Anesthesia Complications (+) PONV and history of anesthetic complications  Airway Mallampati: II  TM Distance: >3 FB Neck ROM: full    Dental no notable dental hx.    Pulmonary neg pulmonary ROS, shortness of breath, asthma , pneumonia, COPD, former smoker   Pulmonary exam normal breath sounds clear to auscultation       Cardiovascular Exercise Tolerance: Good hypertension, + CAD, + Past MI, + CABG and +CHF  + dysrhythmias  Rhythm:regular Rate:Normal     Neuro/Psych  Headaches negative neurological ROS  negative psych ROS   GI/Hepatic negative GI ROS, Neg liver ROS, hiatal hernia,GERD  ,,  Endo/Other  negative endocrine ROSdiabetes Hyperthyroidism   Renal/GU Renal diseasenegative Renal ROS  negative genitourinary   Musculoskeletal   Abdominal   Peds  Hematology negative hematology ROS (+) Blood dyscrasia, anemia   Anesthesia Other Findings COVID+  Reproductive/Obstetrics negative OB ROS                             Anesthesia Physical Anesthesia Plan  ASA: 4 and emergent  Anesthesia Plan: General   Post-op Pain Management:    Induction:   PONV Risk Score and Plan: Propofol infusion  Airway Management Planned:   Additional Equipment:   Intra-op Plan:   Post-operative Plan:   Informed Consent: I have reviewed the patients History and Physical, chart, labs and discussed the procedure including the risks, benefits and alternatives for the proposed anesthesia with the patient or authorized representative who has indicated his/her understanding and acceptance.     Dental Advisory Given  Plan Discussed with: CRNA  Anesthesia  Plan Comments:        Anesthesia Quick Evaluation

## 2023-02-01 NOTE — Progress Notes (Signed)
Progress Note  Patient Name: DELMAR KNOLL Date of Encounter: 02/01/2023  Primary Cardiologist: Jenkins Rouge, MD  Subjective   No acute events overnight.  No symptoms or questions.  N.p.o. for TEE today.  Inpatient Medications    Scheduled Meds:  aspirin EC  81 mg Oral Daily   atorvastatin  80 mg Oral Daily   carvedilol  6.25 mg Oral BID WC   Chlorhexidine Gluconate Cloth  6 each Topical Daily   enoxaparin (LOVENOX) injection  40 mg Subcutaneous Q24H   furosemide  40 mg Oral BID   gabapentin  300 mg Oral TID   omega-3 acid ethyl esters  1 g Oral BID   mouth rinse  15 mL Mouth Rinse 4 times per day   potassium chloride  20 mEq Oral Daily   Continuous Infusions:  ceFEPime (MAXIPIME) IV 2 g (02/01/23 0153)   PRN Meds: acetaminophen **OR** acetaminophen, ondansetron **OR** ondansetron (ZOFRAN) IV, mouth rinse, oxyCODONE   Vital Signs    Vitals:   01/31/23 0405 01/31/23 1301 01/31/23 2016 02/01/23 0357  BP: (!) 116/59 116/60 (!) 108/59 (!) 114/56  Pulse: 91 99 93 87  Resp: '18 16 18 18  '$ Temp: 98.7 F (37.1 C) 98.6 F (37 C) 98.1 F (36.7 C) 97.7 F (36.5 C)  TempSrc: Oral Oral    SpO2: 96% 97% 100% 98%  Weight:      Height:        Intake/Output Summary (Last 24 hours) at 02/01/2023 1117 Last data filed at 02/01/2023 [id number] Gross per 24 hour  Intake 720 ml  Output 1100 ml  Net -380 ml   Filed Weights   01/28/23 1422  Weight: 60.7 kg    Telemetry     Personally reviewed, rate controlled  ECG    Not performed today  Physical Exam   GEN: No acute distress.   Neck: No JVD. Cardiac: RRR, no murmur, rub, or gallop.  Respiratory: Nonlabored. Clear to auscultation bilaterally. GI: Soft, nontender, bowel sounds present. MS: No edema; No deformity. Neuro:  Nonfocal. Psych: Alert and oriented x 3. Normal affect.  Labs    Chemistry Recent Labs  Lab 01/28/23 0700 01/29/23 0310 01/30/23 0355 01/31/23 0501  NA 136 138 135 132*  K 5.0 4.5 3.5 4.2   CL 105 109 101 96*  CO2 18* 21* 25 27  GLUCOSE 277* 83 125* 120*  BUN '21 22 20 '$ 24*  CREATININE 0.95 0.82 0.87 0.95  CALCIUM 8.8* 8.2* 8.1* 8.4*  PROT 7.3  --  6.1*  --   ALBUMIN 3.6  --  2.7*  --   AST 28  --  17  --   ALT 26  --  18  --   ALKPHOS 108  --  85  --   BILITOT 0.9  --  0.5  --   GFRNONAA >60 >60 >60 >60  ANIONGAP '13 8 9 9     '$ Hematology Recent Labs  Lab 01/29/23 0310 01/30/23 0355 01/31/23 0501  WBC 16.1* 15.8* 13.6*  RBC 2.89* 2.89* 2.86*  HGB 9.2* 9.0* 9.1*  HCT 31.0* 29.9* 28.6*  MCV 107.3* 103.5* 100.0  MCH 31.8 31.1 31.8  MCHC 29.7* 30.1 31.8  RDW 15.7* 15.7* 15.6*  PLT 142* 163 178    Cardiac Enzymes Recent Labs  Lab 01/28/23 0658  TROPONINIHS 169*    BNP Recent Labs  Lab 01/28/23 0710 01/29/23 0310  BNP 503.0* 1,670.0*     DDimerNo results for input(s): "DDIMER"  in the last 168 hours.   Radiology    No results found.  Assessment & Plan   Patient is a 77 year old F known to have CAD status post CABG, ICM LVEF 30 to 35% status post [place] single-chamber ICD, LBBB, lymphocytic colitis is currently admitted to the hospitalist team for the management of pneumonia and Pseudomonas aeruginosa bacteremia with IV antibiotics.   # Pseudomonas aeruginosa bacteremia-TTE is not obtained. Due to the presence of ICD, it is not unreasonable to proceed with TEE at this time. Informed consent was obtained prior to the procedure. The risks, benefits and alternatives for the procedure TEE were discussed and the patient comprehended these risks. She is willing to proceed. Risks include, but are not limited to, cough, sore throat, vomiting, nausea, somnolence, esophageal and stomach trauma or perforation, bleeding, low blood pressure, aspiration, pneumonia, infection, trauma to the teeth.  # CAD status post CABG -Continue aspirin 81 mg once daily and Lipitor 80 mg nightly  # ICM LVEF 30 to 35% status post ICD -Continue GDMT -EKG shows LBBB, was  evaluated by EP in the past and did not think she will benefit from CRT-D upgrade.  I have spent a total of 33 minutes with patient reviewing chart , telemetry, EKGs, labs and examining patient as well as establishing an assessment and plan that was discussed with the patient.  > 50% of time was spent in direct patient care.     Signed, Chalmers Guest, MD  02/01/2023, 11:17 AM

## 2023-02-01 NOTE — Interval H&P Note (Signed)
History and Physical Interval Note:  02/01/2023 4:26 PM  Katherine Larson  has presented today for surgery, with the diagnosis of bacteremia.  The various methods of treatment have been discussed with the patient and family. After consideration of risks, benefits and other options for treatment, the patient has consented to  Procedure(s): TRANSESOPHAGEAL ECHOCARDIOGRAM (TEE) (N/A) as a surgical intervention.  The patient's history has been reviewed, patient examined, no change in status, stable for surgery.  I have reviewed the patient's chart and labs.  Questions were answered to the patient's satisfaction.     Rozann Lesches

## 2023-02-01 NOTE — CV Procedure (Signed)
Transesophageal echocardiogram  Indication: Bacteremia, rule out vegetations  Description of procedure: Informed consent was obtained.  Procedure was performed in endoscopy suite 3 with respiratory and contact precautions.  Timeout performed.  Patient positioned left lateral decubitus with bite-block in place.  Deep sedation was achieved via use of propofol, administered and monitored by the anesthesia team.  A multiplane transesophageal echocardiographic probe was inserted into the esophagus and multiple images were obtained with results as follows:   1. Left ventricular ejection fraction, by estimation, is 30 to 35%. The  left ventricle has moderately decreased function. The left ventricle  demonstrates regional wall motion abnormalities (see scoring  diagram/findings for description). The left  ventricular internal cavity size was mildly dilated.   2. Right ventricular systolic function is normal. The right ventricular  size is normal.   3. Left atrial size was mild to moderately dilated. No left atrial/left  atrial appendage thrombus was detected. The LAA emptying velocity was 50  cm/s.   4. The mitral valve is degenerative. Mild mitral valve regurgitation.  Moderate mitral annular calcification.   5. The aortic valve is tricuspid. Aortic valve regurgitation is trivial.   6. 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   7. No valvular vegetations.   8. Device wire visulaized in RA and RV, no obvious vegetations.   Patient tolerated the procedure well without obvious complications.  Satira Sark, M.D., F.A.C.C.

## 2023-02-01 NOTE — Progress Notes (Signed)
Per oncology progress notes from 01/15/23, pt tested + Covid on 01/08/23 & started 5 days of paxlovid on 01/09/23.  Called [facility] Pina) to confirm CT value for 01/28/23 resp pcr was 29.6.  After discussion with Juliet Rude, recommend to continue Airborne & Contact isolation. Returned call to [facility] in [place] Stay, recommend if TEE done today, should be in a room (not PACU) & at end of day with Airborne & Contact isolation observed.

## 2023-02-01 NOTE — Progress Notes (Signed)
*  PRELIMINARY RESULTS* Echocardiogram Echocardiogram Transesophageal has been performed.  Katherine Larson 02/01/2023, 5:05 PM

## 2023-02-01 NOTE — Progress Notes (Signed)
PROGRESS NOTE  Katherine Larson [id number] DOB: [birth year] DOA: 01/28/2023 PCP: Harlan Stains, MD  Brief History:  77 year old female with a history of coronary artery disease, hypertension, hyperlipidemia, HFrEF (EF 30-35%) status post ICD, GERD, lymphocytic colitis, and left bundle branch block presenting with 1 day history of shortness of breath.  The patient was diagnosed with COVID on 01/09/2023 and given a 5-day course of Paxlovid.  She finished the course.  She was doing fine until the past 24 hours when she developed worsening shortness of breath.  She denies any fevers, chills, headache, hemoptysis, diarrhea, abdominal pain.  She had an episode of nausea and vomiting.  There is no hematemesis.  Upon EMS arrival, the patient was noted to be hypoxic with saturation in the 80s on room air.  She had difficulty tolerating CPAP and was placed on nonrebreather.  Upon arrival in the emergency department, the patient was placed on BiPAP. In the ED, the patient was afebrile and hemodynamically stable with oxygen saturation 100% on BiPAP.  WBC 15.1, hemoglobin 11.6, platelets 214,000.  Sodium 136, potassium 5.0, bicarbonate 18, serum creatinine 0.95.  LFTs were unremarkable.  Lactic acid was 5.2. troponin 169.  BNP 503.  Chest x-ray showed left lower lobe, right lower lobe, and right middle lobe opacities.  The patient was started on levofloxacin.  She was given 2 L of IV fluid and started on maintenance fluids.   Assessment/Plan: Sepsis -Secondary to pneumonia and bacteremia -Lactic acid 5.2 -Follow blood cultures -Start vancomycin pending culture data -Continue levofloxacin and meropenem -Obtain UA 6-10 WBC -3/10--personally reviewed CXR--L>R lower lobe opacities  Pseudomonas Bacteremia -Multiplex PCR>>Pseudomonas, not resistant to carbapenem -continue levoflox and meropenem. -repeat blood culture -consult cardiology for TEE, with plans for TEE later Today 02/01/2023. -Continue  to follow infectious disease services.   Lobar pneumonia -Check PCT 7.52>>34.14 -Continue levofloxacin and meropenem -d/c vanc/aztreonam   Acute respiratory failure with hypoxia -Currently on BiPAP>>3L>>2L>>1.5 [place] -Continue to wean as tolerated -Secondary to pneumonia, pulmonary edema   Acute on Chronic HFrEF -12/29/2022 echo EF 30-35%, +WMA, grade 1 DD -Continue the use of carvedilol and continue holding losartan. -Holding Farxiga temporarily in the setting of active infection -Continue current diuretic dose. -Continue to follow daily weights and strict intake and output. -Follow cardiology service recommendation.   CKD stage IIIa -Baseline creatinine 0.9-1.2 -Can sinew to monitor BMP   Mixed hyperlipidemia -Continue statin -Heart healthy diet discussed with patient.   Coronary artery disease -Status post CABG 1996 -Continue aspirin -Continue Lipitor   Elevated troponin -due to demand ischemia most likely -Continue to monitor clinically  Positive COVID PCR -Patient with positive COVID on 01/08/2023; treated with 5 days of Paxlovid as an outpatient -At time of admission with positive COVID PCR -in not need for any treatments and at this time continue supportive care with no isolation needed on the floor.     Family Communication:  spouse updated 3/12   Consultants:  none   Code Status:  FULL    DVT Prophylaxis:  [place] Lovenox     Procedures: As Listed in Progress Note Above   Antibiotics: Levoflox 3/10, 3/12>> Vanc/aztreonam 3/10 [place] 3/11>>   Subjective: Afebrile, no chest pain, no nausea, no vomiting.  Patient with good saturation on 1.5-2 L nasal cannula supplementation.  Expressed overall improvement in her breathing and is in not acute distress.  Objective: Vitals:   01/31/23 2016 02/01/23 0357 02/01/23 1419 02/01/23 1616  BP: (!) 108/59 (!) 114/56 (!) 108/56 98/83  Pulse: 93 87 88 97  Resp: '18 18 20 '$ (!) 32  Temp: 98.1 F (36.7 C) 97.7 F (36.5  C) 98.5 F (36.9 C) 99.6 F (37.6 C)  TempSrc:   Oral Oral  SpO2: 100% 98% 97% 97%  Weight:      Height:        Intake/Output Summary (Last 24 hours) at 02/01/2023 1624 Last data filed at 02/01/2023 1420 Gross per 24 hour  Intake 480 ml  Output 1900 ml  Net -1420 ml   Weight change:  Exam: General exam: Alert, awake, oriented x 3; no fever, no chest pain, no nausea, no vomiting.  Reports feeling better.  Good urine output reported. Respiratory system: No frank crackles, no wheezing, no using accessory muscle.  Good saturation on 2 L encourage supplementation. Cardiovascular system:RRR. No rubs or gallops; no murmur appreciated on exam.  No JVD. Gastrointestinal system: Abdomen is nondistended, soft and nontender. No organomegaly or masses felt. Normal bowel sounds heard. Central nervous system: Alert and oriented. No focal neurological deficits. Extremities: No cyanosis or clubbing; no edema. Skin: No petechiae. Psychiatry: Judgement and insight appear normal. Mood & affect appropriate.   Data Reviewed: I have personally reviewed following labs and imaging studies  Basic Metabolic Panel: Recent Labs  Lab 01/28/23 0700 01/29/23 0310 01/30/23 0355 01/31/23 0501  NA 136 138 135 132*  K 5.0 4.5 3.5 4.2  CL 105 109 101 96*  CO2 18* 21* 25 27  GLUCOSE 277* 83 125* 120*  BUN '21 22 20 '$ 24*  CREATININE 0.95 0.82 0.87 0.95  CALCIUM 8.8* 8.2* 8.1* 8.4*  MG  --   --  1.6*  --   PHOS  --   --  2.8  --    Liver Function Tests: Recent Labs  Lab 01/28/23 0700 01/30/23 0355  AST 28 17  ALT 26 18  ALKPHOS 108 85  BILITOT 0.9 0.5  PROT 7.3 6.1*  ALBUMIN 3.6 2.7*   Coagulation Profile: Recent Labs  Lab 01/28/23 0700  INR 1.1   CBC: Recent Labs  Lab 01/28/23 0658 01/29/23 0310 01/30/23 0355 01/31/23 0501  WBC 15.1* 16.1* 15.8* 13.6*  NEUTROABS 11.4*  --   --   --   HGB 11.6* 9.2* 9.0* 9.1*  HCT 36.9 31.0* 29.9* 28.6*  MCV 101.4* 107.3* 103.5* 100.0  PLT 214 142*  163 178   CBG: Recent Labs  Lab 01/28/23 1419 01/28/23 1728  GLUCAP 133* 126*   Urine analysis:    Component Value Date/Time   COLORURINE YELLOW 01/28/2023 1206   APPEARANCEUR HAZY (A) 01/28/2023 1206   LABSPEC 1.020 01/28/2023 1206   PHURINE 5.5 01/28/2023 1206   GLUCOSEU >=500 (A) 01/28/2023 1206   HGBUR NEGATIVE 01/28/2023 1206   BILIRUBINUR NEGATIVE 01/28/2023 1206   KETONESUR NEGATIVE 01/28/2023 1206   PROTEINUR NEGATIVE 01/28/2023 1206   NITRITE POSITIVE (A) 01/28/2023 1206   LEUKOCYTESUR NEGATIVE 01/28/2023 1206   Sepsis Labs:  Recent Results (from the past 240 hour(s))  Blood culture (routine x 2)     Status: Abnormal   Collection Time: 01/28/23  6:59 AM   Specimen: BLOOD RIGHT WRIST  Result Value Ref Range Status   Specimen Description   Final    BLOOD RIGHT WRIST BOTTLES DRAWN AEROBIC AND ANAEROBIC Performed at [facility], [address], [place], [place] [address]    Special Requests   Final    Blood Culture adequate volume Performed  at [facility], [address], [place], [place] [address]    Culture  Setup Time   Final    GRAM NEGATIVE RODS Gram Stain Report Called to,Read Back By and Verified With: D.SMITH @ 1451 BY STEPHTR 01/29/23 AEROBIC BOTTLE ONLY CRITICAL RESULT CALLED TO, READ BACK BY AND VERIFIED WITH: RN Keturah Barre [facility] [phone] '@1755'$  FH Performed at [facility] [address], [place], [place] [address]    Culture PSEUDOMONAS AERUGINOSA (A)  Final   Report Status 01/31/2023 FINAL  Final   Organism ID, Bacteria PSEUDOMONAS AERUGINOSA  Final      Susceptibility   Pseudomonas aeruginosa - MIC*    CEFTAZIDIME 4 SENSITIVE Sensitive     CIPROFLOXACIN <=0.25 SENSITIVE Sensitive     GENTAMICIN <=1 SENSITIVE Sensitive     IMIPENEM 1 SENSITIVE Sensitive     PIP/TAZO <=4 SENSITIVE Sensitive     CEFEPIME 2 SENSITIVE Sensitive     * PSEUDOMONAS AERUGINOSA  Blood Culture ID Panel (Reflexed)     Status: Abnormal   Collection Time: 01/28/23  6:59  AM  Result Value Ref Range Status   Enterococcus faecalis NOT DETECTED NOT DETECTED Final   Enterococcus Faecium NOT DETECTED NOT DETECTED Final   Listeria monocytogenes NOT DETECTED NOT DETECTED Final   Staphylococcus species NOT DETECTED NOT DETECTED Final   Staphylococcus aureus (BCID) NOT DETECTED NOT DETECTED Final   Staphylococcus epidermidis NOT DETECTED NOT DETECTED Final   Staphylococcus lugdunensis NOT DETECTED NOT DETECTED Final   Streptococcus species NOT DETECTED NOT DETECTED Final   Streptococcus agalactiae NOT DETECTED NOT DETECTED Final   Streptococcus pneumoniae NOT DETECTED NOT DETECTED Final   Streptococcus pyogenes NOT DETECTED NOT DETECTED Final   A.calcoaceticus-baumannii NOT DETECTED NOT DETECTED Final   Bacteroides fragilis NOT DETECTED NOT DETECTED Final   Enterobacterales NOT DETECTED NOT DETECTED Final   Enterobacter cloacae complex NOT DETECTED NOT DETECTED Final   Escherichia coli NOT DETECTED NOT DETECTED Final   Klebsiella aerogenes NOT DETECTED NOT DETECTED Final   Klebsiella oxytoca NOT DETECTED NOT DETECTED Final   Klebsiella pneumoniae NOT DETECTED NOT DETECTED Final   Proteus species NOT DETECTED NOT DETECTED Final   Salmonella species NOT DETECTED NOT DETECTED Final   Serratia marcescens NOT DETECTED NOT DETECTED Final   Haemophilus influenzae NOT DETECTED NOT DETECTED Final   Neisseria meningitidis NOT DETECTED NOT DETECTED Final   Pseudomonas aeruginosa DETECTED (A) NOT DETECTED Final    Comment: CRITICAL RESULT CALLED TO, READ BACK BY AND VERIFIED WITH: RN D. [place] [phone] '@1755'$  FH    Stenotrophomonas maltophilia NOT DETECTED NOT DETECTED Final   Candida albicans NOT DETECTED NOT DETECTED Final   Candida auris NOT DETECTED NOT DETECTED Final   Candida glabrata NOT DETECTED NOT DETECTED Final   Candida krusei NOT DETECTED NOT DETECTED Final   Candida parapsilosis NOT DETECTED NOT DETECTED Final   Candida tropicalis NOT DETECTED NOT DETECTED  Final   Cryptococcus neoformans/gattii NOT DETECTED NOT DETECTED Final   CTX-M ESBL NOT DETECTED NOT DETECTED Final   Carbapenem resistance IMP NOT DETECTED NOT DETECTED Final   Carbapenem resistance KPC NOT DETECTED NOT DETECTED Final   Carbapenem resistance NDM NOT DETECTED NOT DETECTED Final   Carbapenem resistance VIM NOT DETECTED NOT DETECTED Final    Comment: Performed at [facility], [place] [address] [place], [place] [address]  Blood culture (routine x 2)     Status: None (Preliminary result)   Collection Time: 01/28/23  7:04 AM  Specimen: BLOOD RIGHT FOREARM  Result Value Ref Range Status   Specimen Description   Final    BLOOD RIGHT FOREARM BOTTLES DRAWN AEROBIC AND ANAEROBIC   Special Requests Blood Culture adequate volume  Final   Culture   Final    NO GROWTH 4 DAYS Performed at [facility], [address] [place], [place] [address]    Report Status PENDING  Incomplete  Resp panel by RT-PCR (RSV, Flu A&B, Covid) Anterior Nasal Swab     Status: Abnormal   Collection Time: 01/28/23  8:11 AM   Specimen: Anterior Nasal Swab  Result Value Ref Range Status   SARS Coronavirus 2 by RT PCR POSITIVE (A) NEGATIVE Final    Comment: (NOTE) SARS-CoV-2 target nucleic acids are DETECTED.  The SARS-CoV-2 RNA is generally detectable in upper respiratory specimens during the acute phase of infection. Positive results are indicative of the presence of the identified virus, but do not rule out bacterial infection or co-infection with other pathogens not detected by the test. Clinical correlation with patient history and other diagnostic information is necessary to determine patient infection status. The expected result is Negative.  Fact Sheet for Patients: EntrepreneurPulse.com.au  Fact Sheet for Healthcare Providers: IncredibleEmployment.be  This test is not yet approved or cleared by the [place] FDA and  has been authorized for  detection and/or diagnosis of SARS-CoV-2 by FDA under an Emergency Use Authorization (EUA).  This EUA will remain in effect (meaning this test can be used) for the duration of  the COVID-19 declaration under Section 564(b)(1) of the A ct, 21 U.S.C. section 360bbb-3(b)(1), unless the authorization is terminated or revoked sooner.     Influenza A by PCR NEGATIVE NEGATIVE Final   Influenza B by PCR NEGATIVE NEGATIVE Final    Comment: (NOTE) The Xpert Xpress SARS-CoV-2/FLU/RSV plus assay is intended as an aid in the diagnosis of influenza from Nasopharyngeal swab specimens and should not be used as a sole basis for treatment. Nasal washings and aspirates are unacceptable for Xpert Xpress SARS-CoV-2/FLU/RSV testing.  Fact Sheet for Patients: EntrepreneurPulse.com.au  Fact Sheet for Healthcare Providers: IncredibleEmployment.be  This test is not yet approved or cleared by the [place] FDA and has been authorized for detection and/or diagnosis of SARS-CoV-2 by FDA under an Emergency Use Authorization (EUA). This EUA will remain in effect (meaning this test can be used) for the duration of the COVID-19 declaration under Section 564(b)(1) of the Act, 21 U.S.C. section 360bbb-3(b)(1), unless the authorization is terminated or revoked.     Resp Syncytial Virus by PCR NEGATIVE NEGATIVE Final    Comment: (NOTE) Fact Sheet for Patients: EntrepreneurPulse.com.au  Fact Sheet for Healthcare Providers: IncredibleEmployment.be  This test is not yet approved or cleared by the [place] FDA and has been authorized for detection and/or diagnosis of SARS-CoV-2 by FDA under an Emergency Use Authorization (EUA). This EUA will remain in effect (meaning this test can be used) for the duration of the COVID-19 declaration under Section 564(b)(1) of the Act, 21 U.S.C. section 360bbb-3(b)(1), unless the authorization is  terminated or revoked.  Performed at [facility], [address] [place], [place] [address]   MRSA Next Gen by PCR, Nasal     Status: None   Collection Time: 01/28/23 10:09 AM   Specimen: Nasal Mucosa; Nasal Swab  Result Value Ref Range Status   MRSA by PCR Next Gen NOT DETECTED NOT DETECTED Final    Comment: (NOTE) The GeneXpert MRSA Assay (FDA approved for  NASAL specimens only), is one component of a comprehensive MRSA colonization surveillance program. It is not intended to diagnose MRSA infection nor to guide or monitor treatment for MRSA infections. Test performance is not FDA approved in patients less than 80 years old. Performed at [facility], [address] [place], [place] [address]   Culture, blood (Routine X 2) w Reflex to ID Panel     Status: None (Preliminary result)   Collection Time: 01/30/23  8:06 AM   Specimen: BLOOD  Result Value Ref Range Status   Specimen Description BLOOD LEFT ASSIST CONTROL  Final   Special Requests   Final    BOTTLES DRAWN AEROBIC AND ANAEROBIC Blood Culture results may not be optimal due to an excessive volume of blood received in culture bottles   Culture   Final    NO GROWTH 2 DAYS Performed at [facility], [address], [place], [place] [address]    Report Status PENDING  Incomplete  Culture, blood (Routine X 2) w Reflex to ID Panel     Status: None (Preliminary result)   Collection Time: 01/30/23  8:08 AM   Specimen: BLOOD  Result Value Ref Range Status   Specimen Description BLOOD RIGHT HAND  Final   Special Requests   Final    BOTTLES DRAWN AEROBIC AND ANAEROBIC Blood Culture adequate volume   Culture   Final    NO GROWTH 2 DAYS Performed at [facility], [address] [place], [place] [address]    Report Status PENDING  Incomplete     Scheduled Meds:  [MAR Hold] aspirin EC  81 mg Oral Daily   [MAR Hold] atorvastatin  80 mg Oral Daily   [MAR Hold] carvedilol  6.25 mg Oral BID WC   [MAR Hold] Chlorhexidine Gluconate  Cloth  6 each Topical Daily   [MAR Hold] enoxaparin (LOVENOX) injection  40 mg Subcutaneous Q24H   [MAR Hold] furosemide  40 mg Oral BID   [MAR Hold] gabapentin  300 mg Oral TID   [MAR Hold] omega-3 acid ethyl esters  1 g Oral BID   [MAR Hold] mouth rinse  15 mL Mouth Rinse 4 times per day   [MAR Hold] potassium chloride  20 mEq Oral Daily   Continuous Infusions:  [MAR Hold] ceFEPime (MAXIPIME) IV 2 g (02/01/23 1431)   Procedures/Studies: DG CHEST PORT 1 VIEW  Result Date: 01/30/2023 CLINICAL DATA:  Pneumonia, CHF, pulmonary edema, COPD, open heart surgery EXAM: PORTABLE CHEST 1 VIEW COMPARISON:  Portable exam 0811 hours compared to 01/28/2023 FINDINGS: Intraspinal stimulator and LEFT subclavian ICD unchanged. Enlargement of cardiac silhouette post median sternotomy. Atherosclerotic calcification aorta. Pulmonary vascular congestion with asymmetric pulmonary infiltrates greater on LEFT. This likely represents a combination of mild pulmonary edema and superimposed LEFT lung consolidation/pneumonia. Minimal LEFT pleural effusion without pneumothorax. Bones demineralized. IMPRESSION: Enlargement of cardiac silhouette with probable pulmonary edema and superimposed LEFT lung consolidation/pneumonia. Aortic Atherosclerosis (ICD10-I70.0). Electronically Signed   By: Lavonia Dana M.D.   On: 01/30/2023 08:26   DG Chest Port 1 View  Result Date: 01/28/2023 CLINICAL DATA:  Shortness of breath. EXAM: PORTABLE CHEST 1 VIEW COMPARISON:  PA Lat chest 10/04/2022, CTA chest 10/05/2022 FINDINGS: 7:12 a.m. There are partially translucent electrical pads overlying the left mid to lower lung field. Left-sided single lead pacer/AID and wire insertion are unaltered. 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 Stable mild cardiomegaly.  The central vessels are normal caliber. There is patchy consolidation in the left lower lung field and scattered  interstitial and airspace opacities of the right mid and lower lung, findings consistent with  multilobar pneumonia. There is no substantial pleural effusion. The mediastinum is normally outlined. There is aortic calcific plaque. Osteopenia and mild thoracic dextroscoliosis. IMPRESSION: Patchy consolidation in the left lower lung field and scattered interstitial and airspace opacities of the right mid and lower lung field, findings consistent with multilobar pneumonia. Clinical correlation and radiographic follow-up recommended. Electrical pads overlying the left chest. Electronically Signed   By: Telford Nab M.D.   On: 01/28/2023 07:31   DG BONE DENSITY (DXA)  Result Date: 01/17/2023 EXAM: DUAL X-RAY ABSORPTIOMETRY (DXA) FOR BONE MINERAL DENSITY IMPRESSION: Referring Physician:  [place] Your patient completed a bone mineral density test using GE Lunar iDXA system (analysis version: 16). Technologist: lmn PATIENT: Name: Jazzmin, Nauta Patient ID: [id number] Birth Date: [birth year] Height: 62.5 in. Sex: Female Measured: 01/17/2023 Weight: 129.2 lbs. Indications: Advanced Age, Caucasian, COPD, Diabetic non insulin, Gabapentin, Height Loss (781.91), Hysterectomy, One ovary removed, Postmenopausal, Prevacid, Secondary Osteoporosis Fractures: None Treatments: Vitamin D (E933.5) ASSESSMENT: The BMD measured at Femur Neck Right is 0.766 g/cm2 with a T-score of -2.0. This patient is considered osteopenic/low bone mass according to [facility]) criteria. The quality of the exam is good. The lumbar spine was excluded due to degenerative changes as noted on previous exam. Site Region Measured Date Measured Age YA BMD Significant CHANGE T-score DualFemur Neck Right 01/17/2023 75.1 -2.0 0.766 g/cm2 DualFemur Neck Right 06/03/2019 71.5 -2.1 0.742 g/cm2 DualFemur Total Mean 01/17/2023 75.1 -1.0 0.887 g/cm2 * DualFemur Total Mean 06/03/2019 71.5 -0.1 1.001 g/cm2 Left Forearm Radius 33% 01/17/2023 75.1 -1.2 0.774 g/cm2 * Left Forearm Radius 33% 06/03/2019 71.5 -0.2 0.868 g/cm2 World Health  Organization [facility]) criteria for post-menopausal, Caucasian Women: Normal       T-score at or above -1 SD Osteopenia   T-score between -1 and -2.5 SD Osteoporosis T-score at or below -2.5 SD RECOMMENDATION: 1. All patients should optimize calcium and vitamin D intake. 2. Consider FDA-approved medical therapies in postmenopausal women and men aged 15 years and older, based on the following: a. A hip or vertebral (clinical or morphometric) fracture. b. T-score = -2.5 at the femoral neck or spine after appropriate evaluation to exclude secondary causes. c. Low bone mass (T-score between -1.0 and -2.5 at the femoral neck or spine) and a 10-year probability of a hip fracture = 3% or a 10-year probability of a major osteoporosis-related fracture = 20% based on the [place]-adapted WHO algorithm. d. Clinician judgment and/or patient preferences may indicate treatment for people with 10-year fracture probabilities above or below these levels. FOLLOW-UP: Patients with diagnosis of osteoporosis or at high risk for fracture should have regular bone mineral density tests.? Patients eligible for Medicare are allowed routine testing every 2 years.? The testing frequency can be increased to one year for patients who have rapidly progressing disease, are receiving or discontinuing medical therapy to restore bone mass, or have additional risk factors. I have reviewed this study and agree with the findings. Mark A. Thornton Papas, M.D. [facility] FRAX* 10-year Probability of Fracture Based on femoral neck BMD: DualFemur (Right) Major Osteoporotic Fracture: 12.5% Hip Fracture:                3.3% Population:                  [place] (Caucasian) Risk Factors:  Secondary Osteoporosis *FRAX is a Materials engineer of the [employer] of [employer] for Metabolic Bone Disease, a World Pharmacologist (WHO) [facility]. ASSESSMENT: The probability of a major osteoporotic fracture is 12.5% within the next  ten years. The probability of a hip fracture is 3.3% within the next ten years. I have reviewed this report and agree with the above findings. Mark A. Thornton Papas, M.D. [facility] Electronically Signed   By: Lavonia Dana M.D.   On: 01/17/2023 12:04    Barton Dubois, MD  [facility]  If 7PM-7AM, please contact night-coverage [email or web address] Password [facility] 02/01/2023, 4:24 PM   LOS: 4 days

## 2023-02-02 ENCOUNTER — Ambulatory Visit (INDEPENDENT_AMBULATORY_CARE_PROVIDER_SITE_OTHER): Payer: Medicare Other

## 2023-02-02 ENCOUNTER — Inpatient Hospital Stay (HOSPITAL_COMMUNITY): Payer: Medicare Other

## 2023-02-02 DIAGNOSIS — I255 Ischemic cardiomyopathy: Secondary | ICD-10-CM | POA: Diagnosis not present

## 2023-02-02 DIAGNOSIS — A419 Sepsis, unspecified organism: Secondary | ICD-10-CM | POA: Diagnosis not present

## 2023-02-02 DIAGNOSIS — J9601 Acute respiratory failure with hypoxia: Secondary | ICD-10-CM | POA: Diagnosis not present

## 2023-02-02 DIAGNOSIS — I447 Left bundle-branch block, unspecified: Secondary | ICD-10-CM | POA: Diagnosis not present

## 2023-02-02 DIAGNOSIS — I5022 Chronic systolic (congestive) heart failure: Secondary | ICD-10-CM | POA: Diagnosis not present

## 2023-02-02 LAB — CULTURE, BLOOD (ROUTINE X 2)
Culture: NO GROWTH
Special Requests: ADEQUATE

## 2023-02-02 LAB — CREATININE, SERUM
Creatinine, Ser: 0.9 mg/dL (ref 0.44–1.00)
GFR, Estimated: >60 mL/min (ref >60)

## 2023-02-02 NOTE — Plan of Care (Signed)
TEE showed no device or valvular vegetations. There is a moderate (Grade III) plaque in the descending aorta for which recommend to continue aspirin 81 mg once daily, atorvastatin 80 mg QHS and Vascepa 2g BID.  [facility] will sign off.   Medication Recommendations:  aspirin 81 mg once daily, atorvastatin 80 mg QHS, Vascepa 2g BID, lasix 40 mg once daily, coreg 9.375 mg BID, Losartan 100 mg once daily, spironolactone 12.5 mg once daily, Farxiga 10 mg once daily. Other recommendations (labs, testing, etc):  None Follow up as an outpatient:  Advanced HF follow up in 3 months  Katherine Larson Fidel Levy, MD [place]  7:46 AM

## 2023-02-02 NOTE — Progress Notes (Signed)
PROGRESS NOTE  Katherine Larson [id number] DOB: 1946-08-23 DOA: 01/28/2023 PCP: Harlan Stains, MD  Brief History:  77 year old female with a history of coronary artery disease, hypertension, hyperlipidemia, HFrEF (EF 30-35%) status post ICD, GERD, lymphocytic colitis, and left bundle branch block presenting with 1 day history of shortness of breath.  The patient was diagnosed with COVID on 01/09/2023 and given a 5-day course of Paxlovid.  She finished the course.  She was doing fine until the past 24 hours when she developed worsening shortness of breath.  She denies any fevers, chills, headache, hemoptysis, diarrhea, abdominal pain.  She had an episode of nausea and vomiting.  There is no hematemesis.  Upon EMS arrival, the patient was noted to be hypoxic with saturation in the 80s on room air.  She had difficulty tolerating CPAP and was placed on nonrebreather.  Upon arrival in the emergency department, the patient was placed on BiPAP. In the ED, the patient was afebrile and hemodynamically stable with oxygen saturation 100% on BiPAP.  WBC 15.1, hemoglobin 11.6, platelets 214,000.  Sodium 136, potassium 5.0, bicarbonate 18, serum creatinine 0.95.  LFTs were unremarkable.  Lactic acid was 5.2. troponin 169.  BNP 503.  Chest x-ray showed left lower lobe, right lower lobe, and right middle lobe opacities.  The patient was started on levofloxacin.  She was given 2 L of IV fluid and started on maintenance fluids.   Assessment/Plan: Sepsis -Secondary to pneumonia and bacteremia -Lactic acid 5.2 -Follow blood cultures -Start vancomycin pending culture data -Continue levofloxacin and meropenem -Obtain UA 6-10 WBC -3/10--personally reviewed CXR--L>R lower lobe opacities -Sepsis features response open; in the absence of vegetations or abnormalities planning for 2 weeks of ciprofloxacin at time of discharge.  Pseudomonas Bacteremia -Multiplex PCR>>Pseudomonas, not resistant to  carbapenem -continue levoflox and meropenem. -repeat blood culture -TEE on 02/01/2023 demonstrating no vegetations or signs of infection/abnormalities affecting ICD wires. -Checking repeat EKG to follow prolonged QT first noticed at time of admission in order to anticipate 2 weeks of ciprofloxacin therapy. -Continue to follow infectious disease services.   Lobar pneumonia -Check PCT 7.52>>34.14 -Continue levofloxacin and meropenem -d/c vanc/aztreonam -Continue to wean oxygen supplementation as tolerated -Check desaturation screening.   Acute respiratory failure with hypoxia -Currently on BiPAP>>3L>>2L>>1.5 [place] -Continue to wean as tolerated -Secondary to pneumonia, pulmonary edema   Acute on Chronic HFrEF -12/29/2022 echo EF 30-35%, +WMA, grade 1 DD -Continue the use of carvedilol and continue holding losartan. -Holding Farxiga temporarily in the setting of active infection -Continue current diuretic dose. -Continue to follow daily weights and strict intake and output. -Follow cardiology service recommendation.   CKD stage IIIa -Baseline creatinine 0.9-1.2 -Can sinew to monitor BMP   Mixed hyperlipidemia -Continue statin -Heart healthy diet discussed with patient.   Coronary artery disease -Status post CABG 1996 -Continue aspirin -Continue Lipitor   Elevated troponin -due to demand ischemia most likely -Continue to monitor clinically  Positive COVID PCR -Patient with positive COVID on 01/08/2023; treated with 5 days of Paxlovid as an outpatient -At time of admission with positive COVID PCR -in not need for any treatments and at this time continue supportive care with no isolation needed on the floor.     Family Communication:  spouse updated 3/12   Consultants:  none   Code Status:  FULL    DVT Prophylaxis:  [place] Lovenox     Procedures: As Listed in Progress Note Above   Antibiotics:  Levoflox 3/10, 3/12>> Vanc/aztreonam 3/10 Merrem 3/11>>   Subjective: No  fever, no chest pain, no nausea or vomiting.  Tolerated TEE without complications.  Good oxygen saturation on 2 L supplementation.   Objective: Vitals:   02/01/23 2024 02/01/23 2216 02/02/23 0439 02/02/23 1532  BP: (!) 98/56 (!) 95/50 (!) 121/56 (!) 115/57  Pulse: 87  81 83  Resp: 20  18 20   Temp: 99.2 F (37.3 C)  98.9 F (37.2 C) 98.9 F (37.2 C)  TempSrc: Oral  Oral Oral  SpO2: 98%  95% 94%  Weight:      Height:        Intake/Output Summary (Last 24 hours) at 02/02/2023 1814 Last data filed at 02/02/2023 1800 Gross per 24 hour  Intake 740 ml  Output 1450 ml  Net -710 ml   Weight change:  Exam:  General exam: Alert, awake, oriented x 3; no fever, no overnight events.  2 L nasal cannula in place.  Patient denies chest pain. Respiratory system: Good air movement bilaterally; no crackles; no using accessory muscle.  Good saturation on 2 L supplementation. Cardiovascular system:RRR. No rubs or gallops; no JVD. Gastrointestinal system: Abdomen is nondistended, soft and nontender. No organomegaly or masses felt. Normal bowel sounds heard. Central nervous system: Alert and oriented. No focal neurological deficits. Extremities: No cyanosis or clubbing. Skin: No petechiae. Psychiatry: Judgement and insight appear normal. Mood & affect appropriate.   Data Reviewed: I have personally reviewed following labs and imaging studies  Basic Metabolic Panel: Recent Labs  Lab 01/28/23 0700 01/29/23 0310 01/30/23 0355 01/31/23 0501 02/02/23 0420  NA 136 138 135 132*  --   K 5.0 4.5 3.5 4.2  --   CL 105 109 101 96*  --   CO2 18* 21* 25 27  --   GLUCOSE 277* 83 125* 120*  --   BUN 21 22 20  24*  --   CREATININE 0.95 0.82 0.87 0.95 0.90  CALCIUM 8.8* 8.2* 8.1* 8.4*  --   MG  --   --  1.6*  --   --   PHOS  --   --  2.8  --   --    Liver Function Tests: Recent Labs  Lab 01/28/23 0700 01/30/23 0355  AST 28 17  ALT 26 18  ALKPHOS 108 85  BILITOT 0.9 0.5  PROT 7.3 6.1*  ALBUMIN  3.6 2.7*   Coagulation Profile: Recent Labs  Lab 01/28/23 0700  INR 1.1   CBC: Recent Labs  Lab 01/28/23 0658 01/29/23 0310 01/30/23 0355 01/31/23 0501  WBC 15.1* 16.1* 15.8* 13.6*  NEUTROABS 11.4*  --   --   --   HGB 11.6* 9.2* 9.0* 9.1*  HCT 36.9 31.0* 29.9* 28.6*  MCV 101.4* 107.3* 103.5* 100.0  PLT 214 142* 163 178   CBG: Recent Labs  Lab 01/28/23 1419 01/28/23 1728  GLUCAP 133* 126*   Urine analysis:    Component Value Date/Time   COLORURINE YELLOW 01/28/2023 1206   APPEARANCEUR HAZY (A) 01/28/2023 1206   LABSPEC 1.020 01/28/2023 1206   PHURINE 5.5 01/28/2023 1206   GLUCOSEU >=500 (A) 01/28/2023 1206   HGBUR NEGATIVE 01/28/2023 1206   BILIRUBINUR NEGATIVE 01/28/2023 1206   KETONESUR NEGATIVE 01/28/2023 1206   PROTEINUR NEGATIVE 01/28/2023 1206   NITRITE POSITIVE (A) 01/28/2023 1206   LEUKOCYTESUR NEGATIVE 01/28/2023 1206   Sepsis Labs:  Recent Results (from the past 240 hour(s))  Blood culture (routine x 2)     Status:  Abnormal   Collection Time: 01/28/23  6:59 AM   Specimen: BLOOD RIGHT WRIST  Result Value Ref Range Status   Specimen Description   Final    BLOOD RIGHT WRIST BOTTLES DRAWN AEROBIC AND ANAEROBIC Performed at [facility], [address], [place], [place] [address]    Special Requests   Final    Blood Culture adequate volume Performed at [facility], [address] [place], [place] [address]    Culture  Setup Time   Final    GRAM NEGATIVE RODS Gram Stain Report Called to,Read Back By and Verified With: D.SMITH @ 1451 BY STEPHTR 01/29/23 AEROBIC BOTTLE ONLY CRITICAL RESULT CALLED TO, READ BACK BY AND VERIFIED WITH: RN Keturah Barre [facility] [phone] @1755  FH Performed at [facility], [address], [place], [place] [address]    Culture PSEUDOMONAS AERUGINOSA (A)  Final   Report Status 01/31/2023 FINAL  Final   Organism ID, Bacteria PSEUDOMONAS AERUGINOSA  Final      Susceptibility   Pseudomonas aeruginosa - MIC*    CEFTAZIDIME 4  SENSITIVE Sensitive     CIPROFLOXACIN <=0.25 SENSITIVE Sensitive     GENTAMICIN <=1 SENSITIVE Sensitive     IMIPENEM 1 SENSITIVE Sensitive     PIP/TAZO <=4 SENSITIVE Sensitive     CEFEPIME 2 SENSITIVE Sensitive     * PSEUDOMONAS AERUGINOSA  Blood Culture ID Panel (Reflexed)     Status: Abnormal   Collection Time: 01/28/23  6:59 AM  Result Value Ref Range Status   Enterococcus faecalis NOT DETECTED NOT DETECTED Final   Enterococcus Faecium NOT DETECTED NOT DETECTED Final   Listeria monocytogenes NOT DETECTED NOT DETECTED Final   Staphylococcus species NOT DETECTED NOT DETECTED Final   Staphylococcus aureus (BCID) NOT DETECTED NOT DETECTED Final   Staphylococcus epidermidis NOT DETECTED NOT DETECTED Final   Staphylococcus lugdunensis NOT DETECTED NOT DETECTED Final   Streptococcus species NOT DETECTED NOT DETECTED Final   Streptococcus agalactiae NOT DETECTED NOT DETECTED Final   Streptococcus pneumoniae NOT DETECTED NOT DETECTED Final   Streptococcus pyogenes NOT DETECTED NOT DETECTED Final   A.calcoaceticus-baumannii NOT DETECTED NOT DETECTED Final   Bacteroides fragilis NOT DETECTED NOT DETECTED Final   Enterobacterales NOT DETECTED NOT DETECTED Final   Enterobacter cloacae complex NOT DETECTED NOT DETECTED Final   Escherichia coli NOT DETECTED NOT DETECTED Final   Klebsiella aerogenes NOT DETECTED NOT DETECTED Final   Klebsiella oxytoca NOT DETECTED NOT DETECTED Final   Klebsiella pneumoniae NOT DETECTED NOT DETECTED Final   Proteus species NOT DETECTED NOT DETECTED Final   Salmonella species NOT DETECTED NOT DETECTED Final   Serratia marcescens NOT DETECTED NOT DETECTED Final   Haemophilus influenzae NOT DETECTED NOT DETECTED Final   Neisseria meningitidis NOT DETECTED NOT DETECTED Final   Pseudomonas aeruginosa DETECTED (A) NOT DETECTED Final    Comment: CRITICAL RESULT CALLED TO, READ BACK BY AND VERIFIED WITH: RN D. SMMITH [phone] @1755  FH    Stenotrophomonas maltophilia  NOT DETECTED NOT DETECTED Final   Candida albicans NOT DETECTED NOT DETECTED Final   Candida auris NOT DETECTED NOT DETECTED Final   Candida glabrata NOT DETECTED NOT DETECTED Final   Candida krusei NOT DETECTED NOT DETECTED Final   Candida parapsilosis NOT DETECTED NOT DETECTED Final   Candida tropicalis NOT DETECTED NOT DETECTED Final   Cryptococcus neoformans/gattii NOT DETECTED NOT DETECTED Final   CTX-M ESBL NOT DETECTED NOT DETECTED Final   Carbapenem resistance IMP NOT DETECTED NOT DETECTED Final   Carbapenem  resistance KPC NOT DETECTED NOT DETECTED Final   Carbapenem resistance NDM NOT DETECTED NOT DETECTED Final   Carbapenem resistance VIM NOT DETECTED NOT DETECTED Final    Comment: Performed at [facility], [place] [address], [place], [place] [address]  Blood culture (routine x 2)     Status: None   Collection Time: 01/28/23  7:04 AM   Specimen: BLOOD RIGHT FOREARM  Result Value Ref Range Status   Specimen Description   Final    BLOOD RIGHT FOREARM BOTTLES DRAWN AEROBIC AND ANAEROBIC   Special Requests Blood Culture adequate volume  Final   Culture   Final    NO GROWTH 5 DAYS Performed at [facility], [address] [place], [place] [address]    Report Status 02/02/2023 FINAL  Final  Resp panel by RT-PCR (RSV, Flu A&B, Covid) Anterior Nasal Swab     Status: Abnormal   Collection Time: 01/28/23  8:11 AM   Specimen: Anterior Nasal Swab  Result Value Ref Range Status   SARS Coronavirus 2 by RT PCR POSITIVE (A) NEGATIVE Final    Comment: (NOTE) SARS-CoV-2 target nucleic acids are DETECTED.  The SARS-CoV-2 RNA is generally detectable in upper respiratory specimens during the acute phase of infection. Positive results are indicative of the presence of the identified virus, but do not rule out bacterial infection or co-infection with other pathogens not detected by the test. Clinical correlation with patient history and other diagnostic information is necessary to  determine patient infection status. The expected result is Negative.  Fact Sheet for Patients: EntrepreneurPulse.com.au  Fact Sheet for Healthcare Providers: IncredibleEmployment.be  This test is not yet approved or cleared by the [place] FDA and  has been authorized for detection and/or diagnosis of SARS-CoV-2 by FDA under an Emergency Use Authorization (EUA).  This EUA will remain in effect (meaning this test can be used) for the duration of  the COVID-19 declaration under Section 564(b)(1) of the A ct, 21 U.S.C. section 360bbb-3(b)(1), unless the authorization is terminated or revoked sooner.     Influenza A by PCR NEGATIVE NEGATIVE Final   Influenza B by PCR NEGATIVE NEGATIVE Final    Comment: (NOTE) The Xpert Xpress SARS-CoV-2/FLU/RSV plus assay is intended as an aid in the diagnosis of influenza from Nasopharyngeal swab specimens and should not be used as a sole basis for treatment. Nasal washings and aspirates are unacceptable for Xpert Xpress SARS-CoV-2/FLU/RSV testing.  Fact Sheet for Patients: EntrepreneurPulse.com.au  Fact Sheet for Healthcare Providers: IncredibleEmployment.be  This test is not yet approved or cleared by the [place] FDA and has been authorized for detection and/or diagnosis of SARS-CoV-2 by FDA under an Emergency Use Authorization (EUA). This EUA will remain in effect (meaning this test can be used) for the duration of the COVID-19 declaration under Section 564(b)(1) of the Act, 21 U.S.C. section 360bbb-3(b)(1), unless the authorization is terminated or revoked.     Resp Syncytial Virus by PCR NEGATIVE NEGATIVE Final    Comment: (NOTE) Fact Sheet for Patients: EntrepreneurPulse.com.au  Fact Sheet for Healthcare Providers: IncredibleEmployment.be  This test is not yet approved or cleared by the [place] FDA and has  been authorized for detection and/or diagnosis of SARS-CoV-2 by FDA under an Emergency Use Authorization (EUA). This EUA will remain in effect (meaning this test can be used) for the duration of the COVID-19 declaration under Section 564(b)(1) of the Act, 21 U.S.C. section 360bbb-3(b)(1), unless the authorization is terminated or revoked.  Performed at  [address] [place], [place] [address]   MRSA Next Gen by PCR, Nasal     Status: None   Collection Time: 01/28/23 10:09 AM   Specimen: Nasal Mucosa; Nasal Swab  Result Value Ref Range Status   MRSA by PCR Next Gen NOT DETECTED NOT DETECTED Final    Comment: (NOTE) The GeneXpert MRSA Assay (FDA approved for NASAL specimens only), is one component of a comprehensive MRSA colonization surveillance program. It is not intended to diagnose MRSA infection nor to guide or monitor treatment for MRSA infections. Test performance is not FDA approved in patients less than 72 years old. Performed at [facility], [address], [place], [place] [address]   Culture, blood (Routine X 2) w Reflex to ID Panel     Status: None (Preliminary result)   Collection Time: 01/30/23  8:06 AM   Specimen: BLOOD  Result Value Ref Range Status   Specimen Description BLOOD LEFT ASSIST CONTROL  Final   Special Requests   Final    BOTTLES DRAWN AEROBIC AND ANAEROBIC Blood Culture results may not be optimal due to an excessive volume of blood received in culture bottles   Culture   Final    NO GROWTH 3 DAYS Performed at [facility], [address], [place], [place] [address]    Report Status PENDING  Incomplete  Culture, blood (Routine X 2) w Reflex to ID Panel     Status: None (Preliminary result)   Collection Time: 01/30/23  8:08 AM   Specimen: BLOOD  Result Value Ref Range Status   Specimen Description BLOOD RIGHT HAND  Final   Special Requests   Final    BOTTLES DRAWN AEROBIC AND ANAEROBIC Blood Culture adequate volume   Culture   Final     NO GROWTH 3 DAYS Performed at [facility], [address], [place],  [address]    Report Status PENDING  Incomplete     Scheduled Meds:  aspirin EC  81 mg Oral Daily   atorvastatin  80 mg Oral Daily   carvedilol  6.25 mg Oral BID WC   Chlorhexidine Gluconate Cloth  6 each Topical Daily   enoxaparin (LOVENOX) injection  40 mg Subcutaneous Q24H   furosemide  40 mg Oral BID   gabapentin  300 mg Oral TID   omega-3 acid ethyl esters  1 g Oral BID   mouth rinse  15 mL Mouth Rinse 4 times per day   potassium chloride  20 mEq Oral Daily   Continuous Infusions:  ceFEPime (MAXIPIME) IV 2 g (02/02/23 1234)   Procedures/Studies: ECHO TEE  Result Date: 02/01/2023    TRANSESOPHOGEAL ECHO REPORT   Patient Name:   KENZIE EITZEN Date of Exam: 02/01/2023 Medical Rec #:  [id number]       Height:       62.5 in Accession #:    [id number]      Weight:       133.8 lb Date of Birth:  07-29-1946      BSA:          1.621 m Patient Age:    28 years        BP:           117/54 mmHg Patient Gender: F               HR:           95 bpm. Exam Location:  [name] Procedure: Transesophageal Echo, Cardiac  Doppler and Color Doppler Indications:    Bacteremia R78.81  History:        Patient has prior history of Echocardiogram examinations, most                 recent 12/29/2022. CHF, CAD and Previous Myocardial Infarction,                 Prior CABG, COPD, Arrythmias:Atrial Flutter and LBBB; Risk                 Factors:Diabetes, Hypertension and Dyslipidemia. Chronic HFrEF                 (heart failure with reduced ejection fraction) ([place]).  Sonographer:    Alvino Chapel RCS Referring Phys: [id number] [place]: TEE procedure time was 11 minutes. The transesophogeal probe was passed without difficulty through the esophogus of the patient. Imaged were obtained with the patient in a left lateral decubitus position. Local oropharyngeal anesthetic was provided with Cetacaine. Sedation performed by  different physician. The patient was monitored while under deep sedation. Image quality was good. The patient developed no complications during the procedure.  IMPRESSIONS  1. Left ventricular ejection fraction, by estimation, is 30 to 35%. The left ventricle has moderately decreased function. The left ventricle demonstrates regional wall motion abnormalities (see scoring diagram/findings for description). The left ventricular internal cavity size was mildly dilated.  2. Right ventricular systolic function is normal. The right ventricular size is normal.  3. Left atrial size was mild to moderately dilated. No left atrial/left atrial appendage thrombus was detected. The LAA emptying velocity was 50 cm/s.  4. The mitral valve is degenerative. Mild mitral valve regurgitation. Moderate mitral annular calcification.  5. The aortic valve is tricuspid. Aortic valve regurgitation is trivial.  6. 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  7. No valvular vegetations.  8. Device wire visulaized in RA and RV, no obvious vegetations. FINDINGS  Left Ventricle: Left ventricular ejection fraction, by estimation, is 30 to 35%. The left ventricle has moderately decreased function. The left ventricle demonstrates regional wall motion abnormalities. The left ventricular internal cavity size was mildly dilated.  LV Wall Scoring: The entire septum is akinetic. The mid and distal lateral wall, mid anterolateral segment, and apex are hypokinetic. The basal inferolateral segment and basal anterolateral segment are normal. Right Ventricle: The right ventricular size is normal. No increase in right ventricular wall thickness. Right ventricular systolic function is normal. Left Atrium: Left atrial size was mild to moderately dilated. Spontaneous echo contrast was present in the left atrium and left atrial appendage. No left atrial/left atrial appendage thrombus was detected. The LAA emptying velocity was 50 cm/s.  Right Atrium: Right atrial size was normal in size. Pericardium: The pericardium was not well visualized. Mitral Valve: The mitral valve is degenerative in appearance. There is mild thickening of the mitral valve leaflet(s). There is mild calcification of the mitral valve leaflet(s). Moderate mitral annular calcification. Mild mitral valve regurgitation. Tricuspid Valve: The tricuspid valve is grossly normal. Tricuspid valve regurgitation is trivial. Aortic Valve: The aortic valve is tricuspid. Aortic valve regurgitation is trivial. Pulmonic Valve: The pulmonic valve was grossly normal. Pulmonic valve regurgitation is trivial. Aorta: The aortic root is normal in size and structure. There is moderate (Grade III) plaque involving the descending aorta. IAS/Shunts: No atrial level shunt detected by color flow Doppler. Additional Comments: A device lead is visualized. Rozann Lesches MD Electronically signed by Mikeal Hawthorne  Mcdowell MD Signature Date/Time: 02/01/2023/5:47:17 PM    Final    DG CHEST PORT 1 VIEW  Result Date: 01/30/2023 CLINICAL DATA:  Pneumonia, CHF, pulmonary edema, COPD, open heart surgery EXAM: PORTABLE CHEST 1 VIEW COMPARISON:  Portable exam 0811 hours compared to 01/28/2023 FINDINGS: Intraspinal stimulator and LEFT subclavian ICD unchanged. Enlargement of cardiac silhouette post median sternotomy. Atherosclerotic calcification aorta. Pulmonary vascular congestion with asymmetric pulmonary infiltrates greater on LEFT. This likely represents a combination of mild pulmonary edema and superimposed LEFT lung consolidation/pneumonia. Minimal LEFT pleural effusion without pneumothorax. Bones demineralized. IMPRESSION: Enlargement of cardiac silhouette with probable pulmonary edema and superimposed LEFT lung consolidation/pneumonia. Aortic Atherosclerosis (ICD10-I70.0). Electronically Signed   By: Lavonia Dana M.D.   On: 01/30/2023 08:26   DG Chest Port 1 View  Result Date: 01/28/2023 CLINICAL DATA:   Shortness of breath. EXAM: PORTABLE CHEST 1 VIEW COMPARISON:  PA Lat chest 10/04/2022, CTA chest 10/05/2022 FINDINGS: 7:12 a.m. There are partially translucent electrical pads overlying the left mid to lower lung field. Left-sided single lead pacer/AID and wire insertion are unaltered. 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 Stable mild cardiomegaly.  The central vessels are normal caliber. There is patchy consolidation in the left lower lung field and scattered interstitial and airspace opacities of the right mid and lower lung, findings consistent with multilobar pneumonia. There is no substantial pleural effusion. The mediastinum is normally outlined. There is aortic calcific plaque. Osteopenia and mild thoracic dextroscoliosis. IMPRESSION: Patchy consolidation in the left lower lung field and scattered interstitial and airspace opacities of the right mid and lower lung field, findings consistent with multilobar pneumonia. Clinical correlation and radiographic follow-up recommended. Electrical pads overlying the left chest. Electronically Signed   By: Telford Nab M.D.   On: 01/28/2023 07:31   DG BONE DENSITY (DXA)  Result Date: 01/17/2023 EXAM: DUAL X-RAY ABSORPTIOMETRY (DXA) FOR BONE MINERAL DENSITY IMPRESSION: Referring Physician:  [place] Your patient completed a bone mineral density test using GE Lunar iDXA system (analysis version: 16). Technologist: lmn PATIENT: Name: Ariyona, Kull Patient ID: [id number] Birth Date: 05/25/1946 Height: 62.5 in. Sex: Female Measured: 01/17/2023 Weight: 129.2 lbs. Indications: Advanced Age, Caucasian, COPD, Diabetic non insulin, Gabapentin, Height Loss (781.91), Hysterectomy, One ovary removed, Postmenopausal, Prevacid, Secondary Osteoporosis Fractures: None Treatments: Vitamin D (E933.5) ASSESSMENT: The BMD measured at Femur Neck Right is 0.766 g/cm2 with a T-score of -2.0. This patient is considered osteopenic/low bone mass according to [facility])  criteria. The quality of the exam is good. The lumbar spine was excluded due to degenerative changes as noted on previous exam. Site Region Measured Date Measured Age YA BMD Significant CHANGE T-score DualFemur Neck Right 01/17/2023 75.1 -2.0 0.766 g/cm2 DualFemur Neck Right 06/03/2019 71.5 -2.1 0.742 g/cm2 DualFemur Total Mean 01/17/2023 75.1 -1.0 0.887 g/cm2 * DualFemur Total Mean 06/03/2019 71.5 -0.1 1.001 g/cm2 Left Forearm Radius 33% 01/17/2023 75.1 -1.2 0.774 g/cm2 * Left Forearm Radius 33% 06/03/2019 71.5 -0.2 0.868 g/cm2 World Health Organization [facility]) criteria for post-menopausal, Caucasian Women: Normal       T-score at or above -1 SD Osteopenia   T-score between -1 and -2.5 SD Osteoporosis T-score at or below -2.5 SD RECOMMENDATION: 1. All patients should optimize calcium and vitamin D intake. 2. Consider FDA-approved medical therapies in postmenopausal women and men aged 71 years and older, based on the following: a. A hip or vertebral (clinical or morphometric) fracture. b. T-score = -2.5 at the femoral neck or spine after appropriate evaluation to exclude  secondary causes. c. Low bone mass (T-score between -1.0 and -2.5 at the femoral neck or spine) and a 10-year probability of a hip fracture = 3% or a 10-year probability of a major osteoporosis-related fracture = 20% based on the [place]-adapted WHO algorithm. d. Clinician judgment and/or patient preferences may indicate treatment for people with 10-year fracture probabilities above or below these levels. FOLLOW-UP: Patients with diagnosis of osteoporosis or at high risk for fracture should have regular bone mineral density tests.? Patients eligible for Medicare are allowed routine testing every 2 years.? The testing frequency can be increased to one year for patients who have rapidly progressing disease, are receiving or discontinuing medical therapy to restore bone mass, or have additional risk factors. I have reviewed this study and agree with the  findings. Mark A. Thornton Papas, M.D. [facility] FRAX* 10-year Probability of Fracture Based on femoral neck BMD: DualFemur (Right) Major Osteoporotic Fracture: 12.5% Hip Fracture:                3.3% Population:                  [place] (Caucasian) Risk Factors:                Secondary Osteoporosis *FRAX is a [place] [employer] for Metabolic Bone Disease, a [place] (WHO) [facility]. ASSESSMENT: The probability of a major osteoporotic fracture is 12.5% within the next ten years. The probability of a hip fracture is 3.3% within the next ten years. I have reviewed this report and agree with the above findings. Mark A. Thornton Papas, M.D. [facility] Electronically Signed   By: Lavonia Dana M.D.   On: 01/17/2023 12:04    Barton Dubois, MD  [facility]  If 7PM-7AM, please contact night-coverage [email or web address] Password [facility] 02/02/2023, 6:14 PM   LOS: 5 days

## 2023-02-02 NOTE — TOC Progression Note (Signed)
Transition of Care [facility]) - Progression Note    Patient Details  Name: Katherine Larson MRN: [id number] Date of Birth: June 16, 1946  Transition of Care [facility]) CM/SW Contact  Salome Arnt, [place] Phone Number: 02/02/2023, 3:13 PM  Clinical Narrative: Pt will discharge on PO antibiotics. Pam with [employer] updated. Home O2 eval ordered but results not entered yet. Weekend Education officer, museum to follow.          Barriers to Discharge: Continued Medical Work up  Expected Discharge Plan and Services                                               Social Determinants of Health (SDOH) Interventions SDOH Screenings   Food Insecurity: No Food Insecurity (01/28/2023)  Housing: Low Risk  (01/28/2023)  Transportation Needs: No Transportation Needs (01/28/2023)  Utilities: Not At Risk (01/28/2023)  Tobacco Use: Medium Risk (02/01/2023)    Readmission Risk Interventions     No data to display

## 2023-02-02 NOTE — Progress Notes (Signed)
Patient alert and oriented x4. Patient has c/o chronic back pain. PRN Oxycodone given once this shift. Vitals stable. Will continue to monitor.

## 2023-02-03 DIAGNOSIS — R6521 Severe sepsis with septic shock: Secondary | ICD-10-CM

## 2023-02-03 DIAGNOSIS — I5022 Chronic systolic (congestive) heart failure: Secondary | ICD-10-CM | POA: Diagnosis not present

## 2023-02-03 DIAGNOSIS — I447 Left bundle-branch block, unspecified: Secondary | ICD-10-CM | POA: Diagnosis not present

## 2023-02-03 DIAGNOSIS — A4152 Sepsis due to Pseudomonas: Secondary | ICD-10-CM | POA: Diagnosis not present

## 2023-02-03 DIAGNOSIS — J9601 Acute respiratory failure with hypoxia: Secondary | ICD-10-CM | POA: Diagnosis not present

## 2023-02-03 MED ORDER — CIPROFLOXACIN HCL 500 MG PO TABS: 500.0000 mg | ORAL_TABLET | Freq: Two times a day (BID) | ORAL | 0 refills | Status: AC

## 2023-02-03 MED ORDER — LEFLUNOMIDE 20 MG PO TABS: 20.0000 mg | ORAL_TABLET | Freq: Every day | ORAL | Status: DC

## 2023-02-03 MED ORDER — CARVEDILOL 6.25 MG PO TABS: 6.2500 mg | ORAL_TABLET | Freq: Two times a day (BID) | ORAL | Status: DC

## 2023-02-03 NOTE — Progress Notes (Signed)
Pt slept through the night, DBP low, other vitals stable. PRN Oxycodone given for reported pain during the night.

## 2023-02-03 NOTE — TOC Transition Note (Signed)
Transition of Care [facility]) - CM/SW Discharge Note   Patient Details  Name: Katherine Larson MRN: [id number] Date of Birth: 11/22/1945  Transition of Care [facility]) CM/SW Contact:  Kimber Relic, LCSW Phone Number: 02/03/2023, 1:03 PM   Clinical Narrative:    Pt to d/c home on PO antibiotics. Per sat note, pt does not qualify for Home o2. No further TOC needs at this time. TOC signing off.      Barriers to Discharge: Continued Medical Work up   Patient Goals and CMS Choice      Discharge Placement                         Discharge Plan and Services Additional resources added to the After Visit Summary for                                       Social Determinants of Health (SDOH) Interventions SDOH Screenings   Food Insecurity: No Food Insecurity (01/28/2023)  Housing: Low Risk  (01/28/2023)  Transportation Needs: No Transportation Needs (01/28/2023)  Utilities: Not At Risk (01/28/2023)  Tobacco Use: Medium Risk (02/01/2023)     Readmission Risk Interventions     No data to display

## 2023-02-03 NOTE — Progress Notes (Signed)
SATURATION QUALIFICATIONS: (This note is used to comply with regulatory documentation for home oxygen)  Patient Saturations on Room Air at Rest = 97%  Patient Saturations on Room Air while Ambulating = 98%  Patient Saturations on 1 Liters of oxygen while Ambulating = 97%  Please briefly explain why patient needs home oxygen:

## 2023-02-03 NOTE — Progress Notes (Signed)
Nsg Discharge Note  Admit Date:  01/28/2023 Discharge date: 02/03/2023   [name] to be D/C'd Home per MD order.  AVS completed.   Patient/caregiver able to verbalize understanding.  Discharge Medication: Allergies as of 02/03/2023       Reactions   Biaxin [clarithromycin] Rash, Other (See Comments)   Blisters in mouth   Penicillins Rash, Other (See Comments)   Blisters in mouth Has patient had a PCN reaction causing immediate rash, facial/tongue/throat swelling, SOB or lightheadedness with hypotension: Yesyes Has patient had a PCN reaction causing severe rash involving mucus membranes or skin necrosis: Nono Has patient had a PCN reaction that required hospitalization Nono Has patient had a PCN reaction occurring within the last 10 years: Nono If all of the above answers are "NO", then may proceed   Hydroxychloroquine Other (See Comments)   Sulfamethoxazole    Other Reaction(s): Other (See Comments)   Sulfasalazine Other (See Comments)   Losartan Potassium    Other reaction(s): elevated creatinine        Medication List     STOP taking these medications    losartan 100 MG tablet Commonly known as: COZAAR       TAKE these medications    albuterol 108 (90 Base) MCG/ACT inhaler Commonly known as: VENTOLIN HFA Take 2 puffs 3 times a day and every 4 hours as needed.   allopurinol 300 MG tablet Commonly known as: ZYLOPRIM Take 300 mg by mouth daily.   aspirin EC 81 MG tablet Take 1 tablet (81 mg total) by mouth daily.   atorvastatin 80 MG tablet Commonly known as: LIPITOR Take 80 mg by mouth daily.   carvedilol 6.25 MG tablet Commonly known as: COREG Take 1 tablet (6.25 mg total) by mouth 2 (two) times daily with a meal. Take 1 & 1/2 tablets twice daily What changed: See the new instructions.   cholecalciferol 25 MCG (1000 UNIT) tablet Commonly known as: VITAMIN D3 Take 1,000 Units by mouth daily.   ciprofloxacin 500 MG tablet Commonly known as:  Cipro Take 1 tablet (500 mg total) by mouth 2 (two) times daily for 10 days.   Enbrel SureClick 50 MG/ML injection Generic drug: etanercept Inject 50 mg into the skin once a week. For 30 days   Farxiga 10 MG Tabs tablet Generic drug: dapagliflozin propanediol TAKE 1 TABLET(10 MG) BY MOUTH DAILY BEFORE BREAKFAST What changed: See the new instructions.   ferrous sulfate 325 (65 FE) MG tablet Take 325 mg by mouth daily with breakfast. Every other day   fexofenadine 180 MG tablet Commonly known as: ALLEGRA Take 180 mg by mouth daily.   furosemide 40 MG tablet Commonly known as: LASIX Take 40 mg by mouth daily.   gabapentin 300 MG capsule Commonly known as: NEURONTIN Take 300 mg by mouth 3 (three) times daily. What changed: Another medication with the same name was removed. Continue taking this medication, and follow the directions you see here.   HYDROcodone-acetaminophen 10-325 MG tablet Commonly known as: NORCO Take 1 tablet by mouth every 6 (six) hours as needed for moderate pain.   icosapent Ethyl 1 g capsule Commonly known as: Vascepa Take 1 capsule (1 g total) by mouth 2 (two) times daily.   lansoprazole 30 MG disintegrating tablet Commonly known as: PREVACID SOLUTAB Take 30 mg by mouth daily at 12 noon.   leflunomide 20 MG tablet Commonly known as: ARAVA Take 1 tablet (20 mg total) by mouth daily. Start taking on: February 13, 2023 What changed: These instructions start on February 13, 2023. If you are unsure what to do until then, ask your doctor or other care provider.   linaclotide 72 MCG capsule Commonly known as: Linzess Take 1 capsule (72 mcg total) by mouth daily before breakfast.   MAGnesium-Oxide 400 (240 Mg) MG tablet Generic drug: magnesium oxide TAKE 1 TABLET(400 MG) BY MOUTH DAILY What changed: See the new instructions.   meclizine 12.5 MG tablet Commonly known as: ANTIVERT Take 12.5 mg by mouth 3 (three) times daily as needed for dizziness.    montelukast 10 MG tablet Commonly known as: SINGULAIR Take 10 mg by mouth at bedtime.   nitroGLYCERIN 0.4 MG SL tablet Commonly known as: NITROSTAT DISSOLVE 1 TABLET UNDER THE  TONGUE EVERY 5 MINUTES AS NEEDED FOR CHEST PAIN. MAX OF 3 TABLETS IN 15 MINUTES. CALL 911 IF PAIN  PERSISTS. What changed: See the new instructions.   OCUVITE ADULT 50+ PO Take 1 tablet by mouth daily.   spironolactone 25 MG tablet Commonly known as: ALDACTONE Take 0.5 tablets (12.5 mg total) by mouth daily.   Trelegy Ellipta 200-62.5-25 MCG/ACT Aepb Generic drug: Fluticasone-Umeclidin-Vilant Inhale 1 puff into the lungs daily.   triamcinolone cream 0.1 % Commonly known as: KENALOG Apply topically.               Durable Medical Equipment  (From admission, onward)           Start     Ordered   02/03/23 1203  For home use only DME oxygen  Once       Question Answer Comment  Length of Need 12 Months   Mode or (Route) Nasal cannula   Liters per Minute 2   Frequency Continuous (stationary and portable oxygen unit needed)   Oxygen conserving device Yes   Oxygen delivery system Gas      02/03/23 1202            Discharge Assessment: Vitals:   02/02/23 2004 02/03/23 0300  BP: (!) 101/56 (!) 108/53  Pulse: 87 83  Resp: 20 18  Temp: 99 F (37.2 C) 98.4 F (36.9 C)  SpO2: 93% 95%   Skin clean, dry and intact without evidence of skin break down, no evidence of skin tears noted. IV catheter discontinued intact. Site without signs and symptoms of complications - no redness or edema noted at insertion site, patient denies c/o pain - only slight tenderness at site.  Dressing with slight pressure applied.  D/c Instructions-Education: Discharge instructions given to patient/family with verbalized understanding. D/c education completed with patient/family including follow up instructions, medication list, d/c activities limitations if indicated, with other d/c instructions as indicated by  MD - patient able to verbalize understanding, all questions fully answered. Patient instructed to return to ED, call 911, or call MD for any changes in condition.  Patient escorted via [place], and D/C home via private auto.  Kathie Rhodes, RN 02/03/2023 1:08 PM

## 2023-02-03 NOTE — Anesthesia Postprocedure Evaluation (Signed)
Anesthesia Post Note  Patient: Katherine Larson  Procedure(s) Performed: TRANSESOPHAGEAL ECHOCARDIOGRAM (TEE)  Patient location during evaluation: Phase II Anesthesia Type: General Level of consciousness: awake Pain management: pain level controlled Vital Signs Assessment: post-procedure vital signs reviewed and stable Respiratory status: spontaneous breathing and respiratory function stable Cardiovascular status: blood pressure returned to baseline and stable Postop Assessment: no headache and no apparent nausea or vomiting Anesthetic complications: no Comments: Late entry   No notable events documented.   Last Vitals:  Vitals:   02/02/23 2004 02/03/23 0300  BP: (!) 101/56 (!) 108/53  Pulse: 87 83  Resp: 20 18  Temp: 37.2 C 36.9 C  SpO2: 93% 95%    Last Pain:  Vitals:   02/03/23 0834  TempSrc:   PainSc: [place]

## 2023-02-03 NOTE — Discharge Summary (Signed)
Physician Discharge Summary   Patient: Katherine Larson MRN: [id number] DOB: 11-Jul-1946  Admit date:     01/28/2023  Discharge date: 02/03/23  Discharge Physician: Barton Dubois   PCP: Harlan Stains, MD   Recommendations at discharge:  Repeat basic metabolic panel to follow ultralights and renal function Reassess blood pressure and adjust antihypertensive treatment as needed Once therapy with antibiotics completed please resume the use of Arava for rheumatoid arthritis. If blood pressure stable, electrolytes and renal function within normal limits resume losartan Make sure patient has follow-up with cardiology service as instructed.  Discharge Diagnoses: Principal Problem:   Acute respiratory failure with hypoxia ([place]) Active Problems:   Septic shock (HCC)   LBBB (left bundle branch block)   Chronic HFrEF (heart failure with reduced ejection fraction) (HCC)   Multifocal pneumonia   Pseudomonas sepsis [facility])   Bacteremia  Hospital Course: 77 year old female with a history of coronary artery disease, hypertension, hyperlipidemia, HFrEF (EF 30-35%) status post ICD, GERD, lymphocytic colitis, and left bundle branch block presenting with 1 day history of shortness of breath.  The patient was diagnosed with COVID on 01/09/2023 and given a 5-day course of Paxlovid.  She finished the course.  She was doing fine until the past 24 hours when she developed worsening shortness of breath.  She denies any fevers, chills, headache, hemoptysis, diarrhea, abdominal pain.  She had an episode of nausea and vomiting.  There is no hematemesis.  Upon EMS arrival, the patient was noted to be hypoxic with saturation in the 80s on room air.  She had difficulty tolerating CPAP and was placed on nonrebreather.  Upon arrival in the emergency department, the patient was placed on BiPAP. In the ED, the patient was afebrile and hemodynamically stable with oxygen saturation 100% on BiPAP.  WBC 15.1, hemoglobin 11.6,  platelets 214,000.  Sodium 136, potassium 5.0, bicarbonate 18, serum creatinine 0.95.  LFTs were unremarkable.  Lactic acid was 5.2. troponin 169.  BNP 503.  Chest x-ray showed left lower lobe, right lower lobe, and right middle lobe opacities.  The patient was started on levofloxacin.  She was given 2 L of IV fluid and started on maintenance fluids.  Assessment and Plan: Sepsis -Secondary to pneumonia and bacteremia -Lactic acid 5.2 -Follow blood cultures -Start vancomycin pending culture data -Continue levofloxacin and meropenem -Obtain UA 6-10 WBC -3/10--personally reviewed CXR--L>R lower lobe opacities -Sepsis features response open; in the absence of vegetations or abnormalities planning for 2 weeks of ciprofloxacin at time of discharge.   Pseudomonas Bacteremia -Multiplex PCR>>Pseudomonas, not resistant to carbapenem -continue levoflox and meropenem. -repeat blood culture -TEE on 02/01/2023 demonstrating no vegetations or signs of infection/abnormalities affecting ICD wires. -Checking repeat EKG to follow prolonged QT first noticed at time of admission in order to anticipate 2 weeks of ciprofloxacin therapy at discharge. -Appreciate infectious disease services and recommendation.   Lobar pneumonia -Check PCT 7.52>>34.14 -Patient treated with levofloxacin and meropenem while inpatient -Stable at time of discharge.   Acute respiratory failure with hypoxia -Currently on BiPAP>>3L>>2L>>1.5 [place] supplementation needed at rest and up to 2 L with exertion. -Continue to wean as tolerated. -Secondary to pneumonia, pulmonary edema and recent structural changes from COVID infection most likely. -Outpatient follow-up with pulmonologist recommended.   Acute on Chronic HFrEF -12/29/2022 echo EF 30-35%, +WMA, grade 1 DD -Continue the use of carvedilol, Imdur, spironolactone, Lasix and Farxiga -Losartan on hold at time of discharge to completely allow for recovery from sepsis and given low blood  pressure. -Continue patient follow-up with cardiology service -Continue to follow low-sodium diet and follow daily weights.   CKD stage IIIa -Baseline creatinine 0.9-1.2 -Continue to closely follow renal function trend/stability.   Mixed hyperlipidemia -Continue statin -Heart healthy diet discussed with patient.   Coronary artery disease -Status post CABG 1996 -Continue aspirin -Continue Lipitor -Continue patient follow-up with cardiology service.   Elevated troponin -due to demand ischemia most likely -Continue outpatient follow-up with cardiology service.   Positive COVID PCR -Patient with positive COVID on 01/08/2023; treated with 5 days of Paxlovid as an outpatient -At time of admission with positive COVID PCR -in not need for any treatments and at this time continue supportive care with no isolation needed on the floor. -Outpatient follow-up with pulmonology service recommended to rule out fibrosis.  Consultants: Infectious disease, cardiology service Procedures performed: See below for x-ray report; 2D echo and TEE Disposition: Home Diet recommendation: Heart healthy/low-sodium diet.  DISCHARGE MEDICATION: Allergies as of 02/03/2023       Reactions   Biaxin [clarithromycin] Rash, Other (See Comments)   Blisters in mouth   Penicillins Rash, Other (See Comments)   Blisters in mouth Has patient had a PCN reaction causing immediate rash, facial/tongue/throat swelling, SOB or lightheadedness with hypotension: Yesyes Has patient had a PCN reaction causing severe rash involving mucus membranes or skin necrosis: Nono Has patient had a PCN reaction that required hospitalization Nono Has patient had a PCN reaction occurring within the last 10 years: Nono If all of the above answers are "NO", then may proceed   Hydroxychloroquine Other (See Comments)   Sulfamethoxazole    Other Reaction(s): Other (See Comments)   Sulfasalazine Other (See Comments)   Losartan Potassium     Other reaction(s): elevated creatinine        Medication List     STOP taking these medications    losartan 100 MG tablet Commonly known as: COZAAR       TAKE these medications    albuterol 108 (90 Base) MCG/ACT inhaler Commonly known as: VENTOLIN HFA Take 2 puffs 3 times a day and every 4 hours as needed.   allopurinol 300 MG tablet Commonly known as: ZYLOPRIM Take 300 mg by mouth daily.   aspirin EC 81 MG tablet Take 1 tablet (81 mg total) by mouth daily.   atorvastatin 80 MG tablet Commonly known as: LIPITOR Take 80 mg by mouth daily.   carvedilol 6.25 MG tablet Commonly known as: COREG Take 1 tablet (6.25 mg total) by mouth 2 (two) times daily with a meal. Take 1 & 1/2 tablets twice daily What changed: See the new instructions.   cholecalciferol 25 MCG (1000 UNIT) tablet Commonly known as: VITAMIN D3 Take 1,000 Units by mouth daily.   ciprofloxacin 500 MG tablet Commonly known as: Cipro Take 1 tablet (500 mg total) by mouth 2 (two) times daily for 10 days.   Enbrel SureClick 50 MG/ML injection Generic drug: etanercept Inject 50 mg into the skin once a week. For 30 days   Farxiga 10 MG Tabs tablet Generic drug: dapagliflozin propanediol TAKE 1 TABLET(10 MG) BY MOUTH DAILY BEFORE BREAKFAST What changed: See the new instructions.   ferrous sulfate 325 (65 FE) MG tablet Take 325 mg by mouth daily with breakfast. Every other day   fexofenadine 180 MG tablet Commonly known as: ALLEGRA Take 180 mg by mouth daily.   furosemide 40 MG tablet Commonly known as: LASIX Take 40 mg by mouth daily.   gabapentin 300 MG  capsule Commonly known as: NEURONTIN Take 300 mg by mouth 3 (three) times daily. What changed: Another medication with the same name was removed. Continue taking this medication, and follow the directions you see here.   HYDROcodone-acetaminophen 10-325 MG tablet Commonly known as: NORCO Take 1 tablet by mouth every 6 (six) hours as needed  for moderate pain.   icosapent Ethyl 1 g capsule Commonly known as: Vascepa Take 1 capsule (1 g total) by mouth 2 (two) times daily.   lansoprazole 30 MG disintegrating tablet Commonly known as: PREVACID SOLUTAB Take 30 mg by mouth daily at 12 noon.   leflunomide 20 MG tablet Commonly known as: ARAVA Take 1 tablet (20 mg total) by mouth daily. Start taking on: February 13, 2023 What changed: These instructions start on February 13, 2023. If you are unsure what to do until then, ask your doctor or other care provider.   linaclotide 72 MCG capsule Commonly known as: Linzess Take 1 capsule (72 mcg total) by mouth daily before breakfast.   MAGnesium-Oxide 400 (240 Mg) MG tablet Generic drug: magnesium oxide TAKE 1 TABLET(400 MG) BY MOUTH DAILY What changed: See the new instructions.   meclizine 12.5 MG tablet Commonly known as: ANTIVERT Take 12.5 mg by mouth 3 (three) times daily as needed for dizziness.   montelukast 10 MG tablet Commonly known as: SINGULAIR Take 10 mg by mouth at bedtime.   nitroGLYCERIN 0.4 MG SL tablet Commonly known as: NITROSTAT DISSOLVE 1 TABLET UNDER THE  TONGUE EVERY 5 MINUTES AS NEEDED FOR CHEST PAIN. MAX OF 3 TABLETS IN 15 MINUTES. CALL 911 IF PAIN  PERSISTS. What changed: See the new instructions.   OCUVITE ADULT 50+ PO Take 1 tablet by mouth daily.   spironolactone 25 MG tablet Commonly known as: ALDACTONE Take 0.5 tablets (12.5 mg total) by mouth daily.   Trelegy Ellipta 200-62.5-25 MCG/ACT Aepb Generic drug: Fluticasone-Umeclidin-Vilant Inhale 1 puff into the lungs daily.   triamcinolone cream 0.1 % Commonly known as: KENALOG Apply topically.               Durable Medical Equipment  (From admission, onward)           Start     Ordered   02/03/23 1203  For home use only DME oxygen  Once       Question Answer Comment  Length of Need 12 Months   Mode or (Route) Nasal cannula   Liters per Minute 2   Frequency Continuous  (stationary and portable oxygen unit needed)   Oxygen conserving device Yes   Oxygen delivery system Gas      02/03/23 1202            Follow-up Information     Harlan Stains, MD. Schedule an appointment as soon as possible for a visit in 10 day(s).   Specialty: Family Medicine Contact information: [place] [address] [phone]                Discharge Exam: Danley Danker Weights   01/28/23 1422  Weight: 60.7 kg   General exam: Alert, awake, oriented x 3; no fever, no overnight events.  2 L nasal cannula in place.  Patient denies chest pain. Respiratory system: Good air movement bilaterally; no crackles; no using accessory muscle.  Good saturation on 2 L supplementation. Cardiovascular system:RRR. No rubs or gallops; no JVD. Gastrointestinal system: Abdomen is nondistended, soft and nontender. No organomegaly or masses felt. Normal bowel sounds heard.  Central nervous system: Alert and oriented. No focal neurological deficits. Extremities: No cyanosis or clubbing. Skin: No petechiae. Psychiatry: Judgement and insight appear normal. Mood & affect appropriate.   Condition at discharge: Stable and improved.  The results of significant diagnostics from this hospitalization (including imaging, microbiology, ancillary and laboratory) are listed below for reference.   Imaging Studies: ECHO TEE  Result Date: 02/01/2023    TRANSESOPHOGEAL ECHO REPORT   Patient Name:   KIMMY TERZIC Date of Exam: 02/01/2023 Medical Rec #:  [id number]       Height:       62.5 in Accession #:    [id number]      Weight:       133.8 lb Date of Birth:  October 09, 1946      BSA:          1.621 m Patient Age:    74 years        BP:           117/54 mmHg Patient Gender: F               HR:           95 bpm. Exam Location:  [name] Procedure: Transesophageal Echo, Cardiac Doppler and Color Doppler Indications:    Bacteremia R78.81  History:        Patient has prior history of  Echocardiogram examinations, most                 recent 12/29/2022. CHF, CAD and Previous Myocardial Infarction,                 Prior CABG, COPD, Arrythmias:Atrial Flutter and LBBB; Risk                 Factors:Diabetes, Hypertension and Dyslipidemia. Chronic HFrEF                 (heart failure with reduced ejection fraction) ([place]).  Sonographer:    Alvino Chapel RCS Referring Phys: [id number] [place]: TEE procedure time was 11 minutes. The transesophogeal probe was passed without difficulty through the esophogus of the patient. Imaged were obtained with the patient in a left lateral decubitus position. Local oropharyngeal anesthetic was provided with Cetacaine. Sedation performed by different physician. The patient was monitored while under deep sedation. Image quality was good. The patient developed no complications during the procedure.  IMPRESSIONS  1. Left ventricular ejection fraction, by estimation, is 30 to 35%. The left ventricle has moderately decreased function. The left ventricle demonstrates regional wall motion abnormalities (see scoring diagram/findings for description). The left ventricular internal cavity size was mildly dilated.  2. Right ventricular systolic function is normal. The right ventricular size is normal.  3. Left atrial size was mild to moderately dilated. No left atrial/left atrial appendage thrombus was detected. The LAA emptying velocity was 50 cm/s.  4. The mitral valve is degenerative. Mild mitral valve regurgitation. Moderate mitral annular calcification.  5. The aortic valve is tricuspid. Aortic valve regurgitation is trivial.  6. 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  7. No valvular vegetations.  8. Device wire visulaized in RA and RV, no obvious vegetations. FINDINGS  Left Ventricle: Left ventricular ejection fraction, by estimation, is 30 to 35%. The left ventricle has moderately decreased function. The left ventricle  demonstrates regional wall motion abnormalities. The left ventricular internal cavity size was mildly dilated.  LV Wall Scoring: The entire septum is akinetic. The mid  and distal lateral wall, mid anterolateral segment, and apex are hypokinetic. The basal inferolateral segment and basal anterolateral segment are normal. Right Ventricle: The right ventricular size is normal. No increase in right ventricular wall thickness. Right ventricular systolic function is normal. Left Atrium: Left atrial size was mild to moderately dilated. Spontaneous echo contrast was present in the left atrium and left atrial appendage. No left atrial/left atrial appendage thrombus was detected. The LAA emptying velocity was 50 cm/s. Right Atrium: Right atrial size was normal in size. Pericardium: The pericardium was not well visualized. Mitral Valve: The mitral valve is degenerative in appearance. There is mild thickening of the mitral valve leaflet(s). There is mild calcification of the mitral valve leaflet(s). Moderate mitral annular calcification. Mild mitral valve regurgitation. Tricuspid Valve: The tricuspid valve is grossly normal. Tricuspid valve regurgitation is trivial. Aortic Valve: The aortic valve is tricuspid. Aortic valve regurgitation is trivial. Pulmonic Valve: The pulmonic valve was grossly normal. Pulmonic valve regurgitation is trivial. Aorta: The aortic root is normal in size and structure. There is moderate (Grade III) plaque involving the descending aorta. IAS/Shunts: No atrial level shunt detected by color flow Doppler. Additional Comments: A device lead is visualized. Rozann Lesches MD Electronically signed by Rozann Lesches MD Signature Date/Time: 02/01/2023/5:47:17 PM    Final    DG CHEST PORT 1 VIEW  Result Date: 01/30/2023 CLINICAL DATA:  Pneumonia, CHF, pulmonary edema, COPD, open heart surgery EXAM: PORTABLE CHEST 1 VIEW COMPARISON:  Portable exam 0811 hours compared to 01/28/2023 FINDINGS: Intraspinal  stimulator and LEFT subclavian ICD unchanged. Enlargement of cardiac silhouette post median sternotomy. Atherosclerotic calcification aorta. Pulmonary vascular congestion with asymmetric pulmonary infiltrates greater on LEFT. This likely represents a combination of mild pulmonary edema and superimposed LEFT lung consolidation/pneumonia. Minimal LEFT pleural effusion without pneumothorax. Bones demineralized. IMPRESSION: Enlargement of cardiac silhouette with probable pulmonary edema and superimposed LEFT lung consolidation/pneumonia. Aortic Atherosclerosis (ICD10-I70.0). Electronically Signed   By: Lavonia Dana M.D.   On: 01/30/2023 08:26   DG Chest Port 1 View  Result Date: 01/28/2023 CLINICAL DATA:  Shortness of breath. EXAM: PORTABLE CHEST 1 VIEW COMPARISON:  PA Lat chest 10/04/2022, CTA chest 10/05/2022 FINDINGS: 7:12 a.m. There are partially translucent electrical pads overlying the left mid to lower lung field. Left-sided single lead pacer/AID and wire insertion are unaltered. 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 Stable mild cardiomegaly.  The central vessels are normal caliber. There is patchy consolidation in the left lower lung field and scattered interstitial and airspace opacities of the right mid and lower lung, findings consistent with multilobar pneumonia. There is no substantial pleural effusion. The mediastinum is normally outlined. There is aortic calcific plaque. Osteopenia and mild thoracic dextroscoliosis. IMPRESSION: Patchy consolidation in the left lower lung field and scattered interstitial and airspace opacities of the right mid and lower lung field, findings consistent with multilobar pneumonia. Clinical correlation and radiographic follow-up recommended. Electrical pads overlying the left chest. Electronically Signed   By: Telford Nab M.D.   On: 01/28/2023 07:31   DG BONE DENSITY (DXA)  Result Date: 01/17/2023 EXAM: DUAL X-RAY ABSORPTIOMETRY (DXA) FOR BONE MINERAL DENSITY IMPRESSION: Referring  Physician:  Garvin Your patient completed a bone mineral density test using GE Lunar iDXA system (analysis version: 16). Technologist: lmn PATIENT: Name: Shawna, Norrid Patient ID: [id number] Birth Date: 01-02-1946 Height: 62.5 in. Sex: Female Measured: 01/17/2023 Weight: 129.2 lbs. Indications: Advanced Age, Caucasian, COPD, Diabetic non insulin, Gabapentin, Height Loss (781.91), Hysterectomy, One ovary removed, Postmenopausal, Prevacid, Secondary  Osteoporosis Fractures: None Treatments: Vitamin D (E933.5) ASSESSMENT: The BMD measured at Femur Neck Right is 0.766 g/cm2 with a T-score of -2.0. This patient is considered osteopenic/low bone mass according to [facility]) criteria. The quality of the exam is good. The lumbar spine was excluded due to degenerative changes as noted on previous exam. Site Region Measured Date Measured Age YA BMD Significant CHANGE T-score DualFemur Neck Right 01/17/2023 75.1 -2.0 0.766 g/cm2 DualFemur Neck Right 06/03/2019 71.5 -2.1 0.742 g/cm2 DualFemur Total Mean 01/17/2023 75.1 -1.0 0.887 g/cm2 * DualFemur Total Mean 06/03/2019 71.5 -0.1 1.001 g/cm2 Left Forearm Radius 33% 01/17/2023 75.1 -1.2 0.774 g/cm2 * Left Forearm Radius 33% 06/03/2019 71.5 -0.2 0.868 g/cm2 World Health Organization [facility]) criteria for post-menopausal, Caucasian Women: Normal       T-score at or above -1 SD Osteopenia   T-score between -1 and -2.5 SD Osteoporosis T-score at or below -2.5 SD RECOMMENDATION: 1. All patients should optimize calcium and vitamin D intake. 2. Consider FDA-approved medical therapies in postmenopausal women and men aged 23 years and older, based on the following: a. A hip or vertebral (clinical or morphometric) fracture. b. T-score = -2.5 at the femoral neck or spine after appropriate evaluation to exclude secondary causes. c. Low bone mass (T-score between -1.0 and -2.5 at the femoral neck or spine) and a 10-year probability of a hip fracture = 3% or a 10-year  probability of a major osteoporosis-related fracture = 20% based on the [place]-adapted WHO algorithm. d. Clinician judgment and/or patient preferences may indicate treatment for people with 10-year fracture probabilities above or below these levels. FOLLOW-UP: Patients with diagnosis of osteoporosis or at high risk for fracture should have regular bone mineral density tests.? Patients eligible for Medicare are allowed routine testing every 2 years.? The testing frequency can be increased to one year for patients who have rapidly progressing disease, are receiving or discontinuing medical therapy to restore bone mass, or have additional risk factors. I have reviewed this study and agree with the findings. Mark A. Thornton Papas, M.D. [facility] FRAX* 10-year Probability of Fracture Based on femoral neck BMD: DualFemur (Right) Major Osteoporotic Fracture: 12.5% Hip Fracture:                3.3% Population:                  [place] (Caucasian) Risk Factors:                Secondary Osteoporosis *FRAX is a [place] [employer] for Metabolic Bone Disease, a [place] (WHO) [facility]. ASSESSMENT: The probability of a major osteoporotic fracture is 12.5% within the next ten years. The probability of a hip fracture is 3.3% within the next ten years. I have reviewed this report and agree with the above findings. Mark A. Thornton Papas, M.D. [facility] Electronically Signed   By: Lavonia Dana M.D.   On: 01/17/2023 12:04    Microbiology: Results for orders placed or performed during the hospital encounter of 01/28/23  Blood culture (routine x 2)     Status: Abnormal   Collection Time: 01/28/23  6:59 AM   Specimen: BLOOD RIGHT WRIST  Result Value Ref Range Status   Specimen Description   Final    BLOOD RIGHT WRIST BOTTLES DRAWN AEROBIC AND ANAEROBIC Performed at [facility], [address] [place], [place] [address]    Special Requests   Final  Blood Culture adequate volume Performed at [facility], [address] [place], [place] [address]    Culture  Setup Time   Final    GRAM NEGATIVE RODS Gram Stain Report Called to,Read Back By and Verified With: D.SMITH @ 1451 BY STEPHTR 01/29/23 AEROBIC BOTTLE ONLY CRITICAL RESULT CALLED TO, READ BACK BY AND VERIFIED WITH: RN D. [facility] [id number] @1755  FH Performed at [facility], [place] [address], [place], [place] [address]    Culture PSEUDOMONAS AERUGINOSA (A)  Final   Report Status 01/31/2023 FINAL  Final   Organism ID, Bacteria PSEUDOMONAS AERUGINOSA  Final      Susceptibility   Pseudomonas aeruginosa - MIC*    CEFTAZIDIME 4 SENSITIVE Sensitive     CIPROFLOXACIN <=0.25 SENSITIVE Sensitive     GENTAMICIN <=1 SENSITIVE Sensitive     IMIPENEM 1 SENSITIVE Sensitive     PIP/TAZO <=4 SENSITIVE Sensitive     CEFEPIME 2 SENSITIVE Sensitive     * PSEUDOMONAS AERUGINOSA  Blood Culture ID Panel (Reflexed)     Status: Abnormal   Collection Time: 01/28/23  6:59 AM  Result Value Ref Range Status   Enterococcus faecalis NOT DETECTED NOT DETECTED Final   Enterococcus Faecium NOT DETECTED NOT DETECTED Final   Listeria monocytogenes NOT DETECTED NOT DETECTED Final   Staphylococcus species NOT DETECTED NOT DETECTED Final   Staphylococcus aureus (BCID) NOT DETECTED NOT DETECTED Final   Staphylococcus epidermidis NOT DETECTED NOT DETECTED Final   Staphylococcus lugdunensis NOT DETECTED NOT DETECTED Final   Streptococcus species NOT DETECTED NOT DETECTED Final   Streptococcus agalactiae NOT DETECTED NOT DETECTED Final   Streptococcus pneumoniae NOT DETECTED NOT DETECTED Final   Streptococcus pyogenes NOT DETECTED NOT DETECTED Final   A.calcoaceticus-baumannii NOT DETECTED NOT DETECTED Final   Bacteroides fragilis NOT DETECTED NOT DETECTED Final   Enterobacterales NOT DETECTED NOT DETECTED Final   Enterobacter cloacae complex NOT DETECTED NOT DETECTED Final   Escherichia coli NOT DETECTED NOT  DETECTED Final   Klebsiella aerogenes NOT DETECTED NOT DETECTED Final   Klebsiella oxytoca NOT DETECTED NOT DETECTED Final   Klebsiella pneumoniae NOT DETECTED NOT DETECTED Final   Proteus species NOT DETECTED NOT DETECTED Final   Salmonella species NOT DETECTED NOT DETECTED Final   Serratia marcescens NOT DETECTED NOT DETECTED Final   Haemophilus influenzae NOT DETECTED NOT DETECTED Final   Neisseria meningitidis NOT DETECTED NOT DETECTED Final   Pseudomonas aeruginosa DETECTED (A) NOT DETECTED Final    Comment: CRITICAL RESULT CALLED TO, READ BACK BY AND VERIFIED WITH: RN D. SMMITH [phone] @1755  FH    Stenotrophomonas maltophilia NOT DETECTED NOT DETECTED Final   Candida albicans NOT DETECTED NOT DETECTED Final   Candida auris NOT DETECTED NOT DETECTED Final   Candida glabrata NOT DETECTED NOT DETECTED Final   Candida krusei NOT DETECTED NOT DETECTED Final   Candida parapsilosis NOT DETECTED NOT DETECTED Final   Candida tropicalis NOT DETECTED NOT DETECTED Final   Cryptococcus neoformans/gattii NOT DETECTED NOT DETECTED Final   CTX-M ESBL NOT DETECTED NOT DETECTED Final   Carbapenem resistance IMP NOT DETECTED NOT DETECTED Final   Carbapenem resistance KPC NOT DETECTED NOT DETECTED Final   Carbapenem resistance NDM NOT DETECTED NOT DETECTED Final   Carbapenem resistance VIM NOT DETECTED NOT DETECTED Final    Comment: Performed at [facility], [address], [place], [place] [address]  Blood culture (routine x 2)     Status: None   Collection Time: 01/28/23  7:04 AM   Specimen: BLOOD RIGHT FOREARM  Result Value Ref Range Status   Specimen Description   Final    BLOOD RIGHT FOREARM BOTTLES DRAWN AEROBIC AND ANAEROBIC   Special Requests Blood Culture adequate volume  Final   Culture   Final    NO GROWTH 5 DAYS Performed at [facility], [address], [place], [place] [address]    Report Status 02/02/2023 FINAL  Final  Resp panel by RT-PCR (RSV, Flu A&B, Covid) Anterior  Nasal Swab     Status: Abnormal   Collection Time: 01/28/23  8:11 AM   Specimen: Anterior Nasal Swab  Result Value Ref Range Status   SARS Coronavirus 2 by RT PCR POSITIVE (A) NEGATIVE Final    Comment: (NOTE) SARS-CoV-2 target nucleic acids are DETECTED.  The SARS-CoV-2 RNA is generally detectable in upper respiratory specimens during the acute phase of infection. Positive results are indicative of the presence of the identified virus, but do not rule out bacterial infection or co-infection with other pathogens not detected by the test. Clinical correlation with patient history and other diagnostic information is necessary to determine patient infection status. The expected result is Negative.  Fact Sheet for Patients: EntrepreneurPulse.com.au  Fact Sheet for Healthcare Providers: IncredibleEmployment.be  This test is not yet approved or cleared by the [place] FDA and  has been authorized for detection and/or diagnosis of SARS-CoV-2 by FDA under an Emergency Use Authorization (EUA).  This EUA will remain in effect (meaning this test can be used) for the duration of  the COVID-19 declaration under Section 564(b)(1) of the A ct, 21 U.S.C. section 360bbb-3(b)(1), unless the authorization is terminated or revoked sooner.     Influenza A by PCR NEGATIVE NEGATIVE Final   Influenza B by PCR NEGATIVE NEGATIVE Final    Comment: (NOTE) The Xpert Xpress SARS-CoV-2/FLU/RSV plus assay is intended as an aid in the diagnosis of influenza from Nasopharyngeal swab specimens and should not be used as a sole basis for treatment. Nasal washings and aspirates are unacceptable for Xpert Xpress SARS-CoV-2/FLU/RSV testing.  Fact Sheet for Patients: EntrepreneurPulse.com.au  Fact Sheet for Healthcare Providers: IncredibleEmployment.be  This test is not yet approved or cleared by the [place] FDA and has been  authorized for detection and/or diagnosis of SARS-CoV-2 by FDA under an Emergency Use Authorization (EUA). This EUA will remain in effect (meaning this test can be used) for the duration of the COVID-19 declaration under Section 564(b)(1) of the Act, 21 U.S.C. section 360bbb-3(b)(1), unless the authorization is terminated or revoked.     Resp Syncytial Virus by PCR NEGATIVE NEGATIVE Final    Comment: (NOTE) Fact Sheet for Patients: EntrepreneurPulse.com.au  Fact Sheet for Healthcare Providers: IncredibleEmployment.be  This test is not yet approved or cleared by the [place] FDA and has been authorized for detection and/or diagnosis of SARS-CoV-2 by FDA under an Emergency Use Authorization (EUA). This EUA will remain in effect (meaning this test can be used) for the duration of the COVID-19 declaration under Section 564(b)(1) of the Act, 21 U.S.C. section 360bbb-3(b)(1), unless the authorization is terminated or revoked.  Performed at [facility], [address] [place], [place] [address]   MRSA Next Gen by PCR, Nasal     Status: None   Collection Time: 01/28/23 10:09 AM   Specimen: Nasal Mucosa; Nasal Swab  Result Value Ref Range Status   MRSA by PCR Next Gen NOT DETECTED NOT DETECTED Final    Comment: (NOTE) The GeneXpert  MRSA Assay (FDA approved for NASAL specimens only), is one component of a comprehensive MRSA colonization surveillance program. It is not intended to diagnose MRSA infection nor to guide or monitor treatment for MRSA infections. Test performance is not FDA approved in patients less than 76 years old. Performed at [facility] [address], [place], [place] [address]   Culture, blood (Routine X 2) w Reflex to ID Panel     Status: None (Preliminary result)   Collection Time: 01/30/23  8:06 AM   Specimen: BLOOD  Result Value Ref Range Status   Specimen Description BLOOD LEFT ASSIST CONTROL  Final   Special Requests    Final    BOTTLES DRAWN AEROBIC AND ANAEROBIC Blood Culture results may not be optimal due to an excessive volume of blood received in culture bottles   Culture   Final    NO GROWTH 4 DAYS Performed at [facility], [address] [place], [place] [address]    Report Status PENDING  Incomplete  Culture, blood (Routine X 2) w Reflex to ID Panel     Status: None (Preliminary result)   Collection Time: 01/30/23  8:08 AM   Specimen: BLOOD  Result Value Ref Range Status   Specimen Description BLOOD RIGHT HAND  Final   Special Requests   Final    BOTTLES DRAWN AEROBIC AND ANAEROBIC Blood Culture adequate volume   Culture   Final    NO GROWTH 4 DAYS Performed at [facility], [address] [place], [place] [address]    Report Status PENDING  Incomplete    Labs: CBC: Recent Labs  Lab 01/28/23 0658 01/29/23 0310 01/30/23 0355 01/31/23 0501  WBC 15.1* 16.1* 15.8* 13.6*  NEUTROABS 11.4*  --   --   --   HGB 11.6* 9.2* 9.0* 9.1*  HCT 36.9 31.0* 29.9* 28.6*  MCV 101.4* 107.3* 103.5* 100.0  PLT 214 142* 163 0000000   Basic Metabolic Panel: Recent Labs  Lab 01/28/23 0700 01/29/23 0310 01/30/23 0355 01/31/23 0501 02/02/23 0420  NA 136 138 135 132*  --   K 5.0 4.5 3.5 4.2  --   CL 105 109 101 96*  --   CO2 18* 21* 25 27  --   GLUCOSE 277* 83 125* 120*  --   BUN 21 22 20  24*  --   CREATININE 0.95 0.82 0.87 0.95 0.90  CALCIUM 8.8* 8.2* 8.1* 8.4*  --   MG  --   --  1.6*  --   --   PHOS  --   --  2.8  --   --    Liver Function Tests: Recent Labs  Lab 01/28/23 0700 01/30/23 0355  AST 28 17  ALT 26 18  ALKPHOS 108 85  BILITOT 0.9 0.5  PROT 7.3 6.1*  ALBUMIN 3.6 2.7*   CBG: Recent Labs  Lab 01/28/23 1419 01/28/23 1728  GLUCAP 133* 126*    Discharge time spent: greater than 30 minutes.  Signed: Barton Dubois, MD [facility] 02/03/2023

## 2023-02-04 LAB — CULTURE, BLOOD (ROUTINE X 2)
Culture: NO GROWTH
Culture: NO GROWTH
Special Requests: ADEQUATE

## 2023-02-04 LAB — GLUCOSE, CAPILLARY: Glucose-Capillary: 95 mg/dL (ref 70–99)

## 2023-02-06 LAB — CUP PACEART REMOTE DEVICE CHECK
Battery Remaining Longevity: 100 mo
Battery Remaining Percentage: 82 %
Battery Voltage: 3.01 V
Brady Statistic RV Percent Paced: 1 %
Date Time Interrogation Session: 20240319103414
HighPow Impedance: 68 Ohm
Implantable Lead Connection Status: 753985
Implantable Lead Implant Date: 20220318
Implantable Lead Location: 753860
Implantable Pulse Generator Implant Date: 20220318
Lead Channel Impedance Value: 390 Ohm
Lead Channel Pacing Threshold Amplitude: 0.75 V
Lead Channel Pacing Threshold Pulse Width: 0.5 ms
Lead Channel Sensing Intrinsic Amplitude: 12 mV
Lead Channel Setting Pacing Amplitude: 2.5 V
Lead Channel Setting Pacing Pulse Width: 0.5 ms
Lead Channel Setting Sensing Sensitivity: 0.5 mV
Pulse Gen Serial Number: 810021948
Zone Setting Status: 755011

## 2023-02-12 ENCOUNTER — Encounter (HOSPITAL_COMMUNITY): Payer: Self-pay | Admitting: Cardiology

## 2023-02-13 ENCOUNTER — Telehealth (HOSPITAL_COMMUNITY): Payer: Self-pay

## 2023-02-13 ENCOUNTER — Other Ambulatory Visit (HOSPITAL_COMMUNITY): Payer: Self-pay

## 2023-02-13 NOTE — Telephone Encounter (Signed)
Patient Advocate Encounter  Received notification that prior authorization is required for Dapagliflozin. Review of this patients coverage shows that Wilder Glade is preferred and does not require PA at this time.  Clista Bernhardt, CPhT Rx Patient Advocate Phone: [phone]

## 2023-02-16 ENCOUNTER — Other Ambulatory Visit (HOSPITAL_COMMUNITY): Payer: Self-pay | Admitting: Family Medicine

## 2023-02-16 ENCOUNTER — Ambulatory Visit (HOSPITAL_COMMUNITY)
Admission: RE | Admit: 2023-02-16 | Discharge: 2023-02-16 | Disposition: A | Discharge status: Home / Self Care | Payer: Medicare Other | Source: Ambulatory Visit | Attending: Family Medicine | Admitting: Family Medicine

## 2023-02-16 DIAGNOSIS — J181 Lobar pneumonia, unspecified organism: Secondary | ICD-10-CM

## 2023-02-16 DIAGNOSIS — J439 Emphysema, unspecified: Secondary | ICD-10-CM | POA: Diagnosis not present

## 2023-02-20 DIAGNOSIS — M67442 Ganglion, left hand: Secondary | ICD-10-CM | POA: Diagnosis not present

## 2023-02-20 DIAGNOSIS — M67449 Ganglion, unspecified hand: Secondary | ICD-10-CM | POA: Diagnosis not present

## 2023-02-25 ENCOUNTER — Other Ambulatory Visit: Payer: Self-pay | Admitting: Gastroenterology

## 2023-02-25 DIAGNOSIS — K219 Gastro-esophageal reflux disease without esophagitis: Secondary | ICD-10-CM

## 2023-02-26 DIAGNOSIS — I129 Hypertensive chronic kidney disease with stage 1 through stage 4 chronic kidney disease, or unspecified chronic kidney disease: Secondary | ICD-10-CM | POA: Diagnosis not present

## 2023-02-26 DIAGNOSIS — E785 Hyperlipidemia, unspecified: Secondary | ICD-10-CM | POA: Diagnosis not present

## 2023-02-26 DIAGNOSIS — J449 Chronic obstructive pulmonary disease, unspecified: Secondary | ICD-10-CM | POA: Diagnosis not present

## 2023-02-26 DIAGNOSIS — I5022 Chronic systolic (congestive) heart failure: Secondary | ICD-10-CM | POA: Diagnosis not present

## 2023-02-26 DIAGNOSIS — N183 Chronic kidney disease, stage 3 unspecified: Secondary | ICD-10-CM | POA: Diagnosis not present

## 2023-02-26 DIAGNOSIS — E1121 Type 2 diabetes mellitus with diabetic nephropathy: Secondary | ICD-10-CM | POA: Diagnosis not present

## 2023-03-06 NOTE — Progress Notes (Signed)
Remote ICD transmission.   

## 2023-03-12 NOTE — Progress Notes (Incomplete)
PCP: Laurann [place], MD Cardiology: Dr. Eden Emms HF Cardiology: Dr. Shirlee Latch  77 y.o. with history of CAD s/p CABG and ischemic cardiomyopathy was referred by Dr. Eden Emms for evaluation of CHF.  Patient has a long h/o heart disease.  In 1996, she had CABG with sequential LIMA-LAD and diagonal.  Last cath in 5/21 showed totally occluded LAD with patent LIMA-LAD and diagonal, minimal disease in LCx and RCA. LV systolic function has been reduced.  She has a Secondary school teacher ICD.  Last echo in 9/22 showed EF 25-30%, low normal RV function, moderate MR.  She has a chronic LBBB.  She additionally has rheumatoid arthritis and significant low back pain for which she has a spinal stimulator.   She tried to take [facility] in the past and was very tired/fatigued on it.  She was unable to tolerate it even at low dose and stopped it.  She was lightheaded when we tried to increase Coreg to 12.5 mg bid.   Today she returns for HF follow up with her husband. No significant exertional dyspnea.  No orthopnea/PND.  She has episodes of vertigo-type symptoms with a spinning sensation.  No chest pain.  No falls.  Weight is stable. Main complaint has been chronic colitis recently with diarrhea.    ECG (personally reviewed): NSR, IVCD 134 msec.  Labs (11/22): K 4.9, creatinine 0.95 Labs (2/23): K 4.7, creatinine 1.06 Labs (4/23): K 4.5, creatinine 1.21, LDL 45, HDL 27, TGs 238 Labs (7/23): LDL 42, TGs 200 Labs (1/24): K 4.4, creatinine 0.81, hgb 10.8  St Jude ICD: Stable thoracic impedance, no VT   PMH: 1. HTN 2. Hyperlipidemia 3. GERD 4. Gout 5. Fe deficiency anemia 6. CAD: CABG in 1996 with sequential LIMA-diagonal and LAD.  - LHC (5/21): totally occluded LAD with patent sequent LIMA-D and LAD, no significant LCx or RCA disease.  7. Chronic systolic CHF: Ischemic cardiomyopathy.  St Jude ICD.  - Echo (9/22): EF 25-30%, low normal RV function, moderate MR.  8. Low back pain: Arthritis, has spinal stimulator.  9. Chronic  LBBB 10. Rheumatoid arthritis.  11. COPD: Prior smoker.   Social History   Socioeconomic History   Marital status: Married    Spouse name: Not on file   Number of children: 3   Years of education: Not on file   Highest education level: Not on file  Occupational History   Not on file  Tobacco Use   Smoking status: Former    Packs/day: 0.75    Years: 15.00    Additional pack years: 0.00    Total pack years: 11.25    Types: Cigarettes    Quit date: 11/20/1994    Years since quitting: 28.3   Smokeless tobacco: Never   Tobacco comments:    Quit in 1996  Vaping Use   Vaping Use: Never used  Substance and Sexual Activity   Alcohol use: Never    Alcohol/week: 0.0 standard drinks of alcohol   Drug use: Never   Sexual activity: Yes  Other Topics Concern   Not on file  Social History Narrative   2 living children, one deceased age 6   Right handed   One story home   Drinks coffee am   Social Determinants of Health   Financial Resource Strain: Not on file  Food Insecurity: No Food Insecurity (01/28/2023)   Hunger Vital Sign    Worried About Running Out of Food in the Last Year: Never true    Ran Out of  Food in the Last Year: Never true  Transportation Needs: No Transportation Needs (01/28/2023)   PRAPARE - Administrator, Civil Service (Medical): No    Lack of Transportation (Non-Medical): No  Physical Activity: Not on file  Stress: Not on file  Social Connections: Not on file  Intimate Partner Violence: Not At Risk (01/28/2023)   Humiliation, Afraid, Rape, and Kick questionnaire    Fear of Current or Ex-Partner: No    Emotionally Abused: No    Physically Abused: No    Sexually Abused: No   Family History  Problem Relation Age of Onset   Heart attack Father    Heart attack Mother    Bladder Cancer Brother    Cervical cancer Daughter        ?   Colon cancer Neg Hx    ROS: All systems reviewed and negative except as per HPI.   Current Outpatient  Medications  Medication Sig Dispense Refill   albuterol (PROVENTIL HFA;VENTOLIN HFA) 108 (90 Base) MCG/ACT inhaler Take 2 puffs 3 times a day and every 4 hours as needed.     allopurinol (ZYLOPRIM) 300 MG tablet Take 300 mg by mouth daily.     aspirin EC 81 MG EC tablet Take 1 tablet (81 mg total) by mouth daily.     atorvastatin (LIPITOR) 80 MG tablet Take 80 mg by mouth daily.     carvedilol (COREG) 6.25 MG tablet Take 1 tablet (6.25 mg total) by mouth 2 (two) times daily with a meal. Take 1 & 1/2 tablets twice daily     cholecalciferol (VITAMIN D3) 25 MCG (1000 UT) tablet Take 1,000 Units by mouth daily.     etanercept (ENBREL SURECLICK) 50 MG/ML injection Inject 50 mg into the skin once a week. For 30 days     FARXIGA 10 MG TABS tablet TAKE 1 TABLET(10 MG) BY MOUTH DAILY BEFORE BREAKFAST (Patient taking differently: Take 10 mg by mouth daily.) 30 tablet 11   ferrous sulfate 325 (65 FE) MG tablet Take 325 mg by mouth daily with breakfast. Every other day     fexofenadine (ALLEGRA) 180 MG tablet Take 180 mg by mouth daily.     furosemide (LASIX) 40 MG tablet Take 40 mg by mouth daily.     gabapentin (NEURONTIN) 300 MG capsule Take 300 mg by mouth 3 (three) times daily.     HYDROcodone-acetaminophen (NORCO) 10-325 MG tablet Take 1 tablet by mouth every 6 (six) hours as needed for moderate pain.     icosapent Ethyl (VASCEPA) 1 g capsule Take 1 capsule (1 g total) by mouth 2 (two) times daily. 120 capsule 10   lansoprazole (PREVACID) 30 MG capsule Take one capsule once to twice daily before a meal. 60 capsule 3   leflunomide (ARAVA) 20 MG tablet Take 1 tablet (20 mg total) by mouth daily.     linaclotide (LINZESS) 72 MCG capsule Take 1 capsule (72 mcg total) by mouth daily before breakfast. 12 capsule 0   MAGNESIUM-OXIDE 400 (240 Mg) MG tablet TAKE 1 TABLET(400 MG) BY MOUTH DAILY (Patient taking differently: Take 400 mg by mouth daily.) 30 tablet 2   meclizine (ANTIVERT) 12.5 MG tablet Take 12.5  mg by mouth 3 (three) times daily as needed for dizziness.     montelukast (SINGULAIR) 10 MG tablet Take 10 mg by mouth at bedtime.     Multiple Vitamins-Minerals (OCUVITE ADULT 50+ PO) Take 1 tablet by mouth daily.     nitroGLYCERIN (  NITROSTAT) 0.4 MG SL tablet DISSOLVE 1 TABLET UNDER THE  TONGUE EVERY 5 MINUTES AS NEEDED FOR CHEST PAIN. MAX OF 3 TABLETS IN 15 MINUTES. CALL 911 IF PAIN  PERSISTS. (Patient taking differently: Place 0.4 mg under the tongue every 5 (five) minutes as needed for chest pain.) 75 tablet 4   spironolactone (ALDACTONE) 25 MG tablet Take 0.5 tablets (12.5 mg total) by mouth daily. 45 tablet 3   TRELEGY ELLIPTA 200-62.5-25 MCG/ACT AEPB Inhale 1 puff into the lungs daily.     triamcinolone cream (KENALOG) 0.1 % Apply topically.     No current facility-administered medications for this visit.   Wt Readings from Last 3 Encounters:  01/28/23 60.7 kg (133 lb 13.1 oz)  01/15/23 59.6 kg (131 lb 4.8 oz)  01/03/23 60 kg (132 lb 3.2 oz)   There were no vitals taken for this visit. General: NAD Neck: No JVD, no thyromegaly or thyroid nodule.  Lungs: Clear to auscultation bilaterally with normal respiratory effort. CV: Nondisplaced PMI.  Heart regular S1/S2, no S3/S4, no murmur.  No peripheral edema.  No carotid bruit.  Normal pedal pulses.  Abdomen: Soft, nontender, no hepatosplenomegaly, no distention.  Skin: Intact without lesions or rashes.  Neurologic: Alert and oriented x 3.  Psych: Normal affect. Extremities: No clubbing or cyanosis.  HEENT: Normal.   Assessment/Plan: 1. Chronic systolic CHF: Probably ischemic cardiomyopathy (h/o occluded LAD, s/p CABG). Coronaries have been unchanged since CABG in 1996. However, EF has declined over the years and I worry that another process may be present.  Unable to obtain cardiac MRI with spinal cord stimulator. NYHA class II symptoms. She is not volume overloaded by exam or Corevue.   - Continue spironolactone 25 mg daily. BMET  today.  - Continue Farxiga 10 mg daily.  - Continue Coreg 9.375 mg bid (She did not tolerate 12.5 mg bid due to orthostasis).  - Continue losartan 100 mg daily (unable to tolerate even lowest dose Entresto).  - Continue Lasix 40 mg bid. - ECG shows IVCD 134 msec, probably not wide enough to benefit significantly from CRT upgrade.  - I will arrange for echo.  2. CAD: CABG 1996.  Cath in 5/21 due to fall in EF showed totally occluded LAD, patent RCA and LCx, patent LIMA-LAD and diagonal.  No change, no interventional target.  No chest pain.  - Continue ASA 81 daily.  - Continue atorvastatin 80 daily + Vasceapa, good lipids in 7/23.   Follow up in 4 months with APP.   [facility]  03/12/2023

## 2023-03-13 DIAGNOSIS — M47812 Spondylosis without myelopathy or radiculopathy, cervical region: Secondary | ICD-10-CM | POA: Diagnosis not present

## 2023-03-13 DIAGNOSIS — M961 Postlaminectomy syndrome, not elsewhere classified: Secondary | ICD-10-CM | POA: Diagnosis not present

## 2023-03-13 DIAGNOSIS — Z9689 Presence of other specified functional implants: Secondary | ICD-10-CM | POA: Diagnosis not present

## 2023-03-14 ENCOUNTER — Ambulatory Visit (HOSPITAL_COMMUNITY)
Admission: RE | Admit: 2023-03-14 | Discharge: 2023-03-14 | Disposition: A | Discharge status: Home / Self Care | Payer: Medicare Other | Source: Ambulatory Visit | Attending: Family Medicine | Admitting: Family Medicine

## 2023-03-14 ENCOUNTER — Encounter (HOSPITAL_COMMUNITY): Payer: Self-pay

## 2023-03-14 VITALS — BP 126/64 | HR 77 | Wt 131.8 lb

## 2023-03-14 DIAGNOSIS — Z7982 Long term (current) use of aspirin: Secondary | ICD-10-CM | POA: Insufficient documentation

## 2023-03-14 DIAGNOSIS — Z951 Presence of aortocoronary bypass graft: Secondary | ICD-10-CM | POA: Insufficient documentation

## 2023-03-14 DIAGNOSIS — I2510 Atherosclerotic heart disease of native coronary artery without angina pectoris: Secondary | ICD-10-CM | POA: Insufficient documentation

## 2023-03-14 DIAGNOSIS — J9611 Chronic respiratory failure with hypoxia: Secondary | ICD-10-CM | POA: Insufficient documentation

## 2023-03-14 DIAGNOSIS — I255 Ischemic cardiomyopathy: Secondary | ICD-10-CM | POA: Diagnosis not present

## 2023-03-14 DIAGNOSIS — I2582 Chronic total occlusion of coronary artery: Secondary | ICD-10-CM | POA: Insufficient documentation

## 2023-03-14 DIAGNOSIS — Z79899 Other long term (current) drug therapy: Secondary | ICD-10-CM | POA: Insufficient documentation

## 2023-03-14 DIAGNOSIS — I110 Hypertensive heart disease with heart failure: Secondary | ICD-10-CM | POA: Diagnosis not present

## 2023-03-14 DIAGNOSIS — I5022 Chronic systolic (congestive) heart failure: Secondary | ICD-10-CM

## 2023-03-14 LAB — BASIC METABOLIC PANEL
Anion gap: 12 (ref 5–15)
BUN: 31 mg/dL — ABNORMAL HIGH (ref 8–23)
CO2: 27 mmol/L (ref 22–32)
Calcium: 9.7 mg/dL (ref 8.9–10.3)
Chloride: 100 mmol/L (ref 98–111)
Creatinine, Ser: 0.98 mg/dL (ref 0.44–1.00)
GFR, Estimated: >60 mL/min (ref >60)
Glucose, Bld: 103 mg/dL — ABNORMAL HIGH (ref 70–99)
Potassium: 4.4 mmol/L (ref 3.5–5.1)
Sodium: 139 mmol/L (ref 135–145)

## 2023-03-14 NOTE — Patient Instructions (Signed)
It was great to see you today! No medication changes are needed at this time.  Labs today We will only contact you if something comes back abnormal or we need to make some changes. Otherwise no news is good news!  Your physician recommends that you schedule a follow-up appointment in: 4 months  in the Advanced Practitioners (PA/NP) Clinic    Do the following things EVERYDAY: Weigh yourself in the morning before breakfast. Write it down and keep it in a log. Take your medicines as prescribed Eat low salt foods--Limit salt (sodium) to 2000 mg per day.  Stay as active as you can everyday Limit all fluids for the day to less than 2 liters  At the [facility], you and your health needs are our priority. As part of our continuing mission to provide you with exceptional heart care, we have created designated Provider Care Teams. These Care Teams include your primary Cardiologist (physician) and Advanced Practice Providers (APPs- Physician Assistants and Nurse Practitioners) who all work together to provide you with the care you need, when you need it.   You may see any of the following providers on your designated Care Team at your next follow up: Dr Arvilla Meres Dr Marca Ancona Dr. Marcos Eke, NP Robbie Lis, [place] [facility] [place], [place] Brynda Peon, NP Karle Plumber, PharmD   Please be sure to bring in all your medications bottles to every appointment.    Thank you for choosing  [facility]

## 2023-03-14 NOTE — Addendum Note (Signed)
Encounter addended by: Jacklynn Ganong, FNP on: 03/14/2023 11:57 AM  Actions taken: Clinical Note Signed

## 2023-03-14 NOTE — Progress Notes (Signed)
Transmission reviewed on behalf of Prince Rome, NP at HF clinic.   03/14/2023 CorVue thoracic impedance normal but was suggesting possible fluid accumulation from 3/26-4/5.

## 2023-03-16 ENCOUNTER — Ambulatory Visit (INDEPENDENT_AMBULATORY_CARE_PROVIDER_SITE_OTHER): Payer: Medicare Other

## 2023-03-16 ENCOUNTER — Encounter: Payer: Self-pay | Admitting: Pulmonary Disease

## 2023-03-16 ENCOUNTER — Ambulatory Visit: Payer: Medicare Other | Admitting: Pulmonary Disease

## 2023-03-16 VITALS — BP 120/60 | HR 75 | Temp 98.2°F | Ht 62.5 in | Wt 130.0 lb

## 2023-03-16 DIAGNOSIS — J189 Pneumonia, unspecified organism: Secondary | ICD-10-CM

## 2023-03-16 DIAGNOSIS — J439 Emphysema, unspecified: Secondary | ICD-10-CM | POA: Diagnosis not present

## 2023-03-16 DIAGNOSIS — R0602 Shortness of breath: Secondary | ICD-10-CM

## 2023-03-16 NOTE — Patient Instructions (Signed)
Will get a chest x-ray today for evaluation of the lungs Schedule PFTs in 3 months Return to clinic in 3 months after PFTs

## 2023-03-16 NOTE — Progress Notes (Unsigned)
Katherine Larson    [id number]    11/09/1946  Primary Care Physician:White, Aram Beecham, MD  Referring Physician: Laurann Montana, MD [phone] [place],  [place] [address]  Chief complaint: Consult for dyspnea  HPI: 77 y.o.  with a history of coronary artery disease, hypertension, hyperlipidemia, HFrEF (EF 30-35%) status post ICD, CKD stage IIIa, GERD, lymphocytic colitis, and left bundle branch block   Here for evaluation of dyspnea for the past 3 months.  She was diagnosed with COVID-19 pneumonia on 01/09/2023 which was treated as an outpatient with Paxlovid but then had worsening dyspnea and was hospitalized hospitalized on 02/07/2023 with acute respiratory failure with bilateral multilobar pneumonia, Pseudomonas bacteremia, sepsis with elevated troponins.  Admitted and required BiPAP.  Treated with antibiotics, IV fluids  Post discharge she has made some recovery but continues to have symptoms of dyspnea.  Denies any cough, sputum production, fevers or chills  Pets: No pets Occupation: Retired Garment/textile technologist Exposures: No mold, hot tub, Jacuzzi.  No feather pillows or comforters Smoking history: For 15 pack years.  Quit in 1996 Travel history: No significant travel history Relevant family history: No family history of lung disease   Outpatient Encounter Medications as of 03/16/2023  Medication Sig   albuterol (PROVENTIL HFA;VENTOLIN HFA) 108 (90 Base) MCG/ACT inhaler Take 2 puffs 3 times a day and every 4 hours as needed.   allopurinol (ZYLOPRIM) 300 MG tablet Take 300 mg by mouth daily.   aspirin EC 81 MG EC tablet Take 1 tablet (81 mg total) by mouth daily.   atorvastatin (LIPITOR) 80 MG tablet Take 80 mg by mouth daily.   carvedilol (COREG) 6.25 MG tablet Patient takes 1.5 tablet by mouth twice daily.   cholecalciferol (VITAMIN D3) 25 MCG (1000 UT) tablet Take 1,000 Units by mouth daily.   etanercept (ENBREL SURECLICK) 50 MG/ML injection Inject 50 mg into  the skin once a week. For 30 days   FARXIGA 10 MG TABS tablet TAKE 1 TABLET(10 MG) BY MOUTH DAILY BEFORE BREAKFAST   ferrous sulfate 325 (65 FE) MG tablet Take 325 mg by mouth daily with breakfast. Every other day   fexofenadine (ALLEGRA) 180 MG tablet Take 180 mg by mouth daily.   furosemide (LASIX) 40 MG tablet Take 40 mg by mouth daily.   gabapentin (NEURONTIN) 300 MG capsule Take 300 mg by mouth 3 (three) times daily.   HYDROcodone-acetaminophen (NORCO) 10-325 MG tablet Take 1 tablet by mouth every 6 (six) hours as needed for moderate pain.   icosapent Ethyl (VASCEPA) 1 g capsule Take 1 capsule (1 g total) by mouth 2 (two) times daily.   lansoprazole (PREVACID) 30 MG capsule Take one capsule once to twice daily before a meal.   leflunomide (ARAVA) 20 MG tablet Take 1 tablet (20 mg total) by mouth daily.   linaclotide (LINZESS) 72 MCG capsule Take 1 capsule (72 mcg total) by mouth daily before breakfast.   losartan (COZAAR) 100 MG tablet Take 100 mg by mouth daily.   MAGNESIUM-OXIDE 400 (240 Mg) MG tablet TAKE 1 TABLET(400 MG) BY MOUTH DAILY   meclizine (ANTIVERT) 12.5 MG tablet Take 12.5 mg by mouth 3 (three) times daily as needed for dizziness.   montelukast (SINGULAIR) 10 MG tablet Take 10 mg by mouth at bedtime.   Multiple Vitamins-Minerals (OCUVITE ADULT 50+ PO) Take 1 tablet by mouth daily.   spironolactone (ALDACTONE) 25 MG tablet Take 0.5 tablets (12.5 mg total)  by mouth daily.   TRELEGY ELLIPTA 200-62.5-25 MCG/ACT AEPB Inhale 1 puff into the lungs daily.   triamcinolone cream (KENALOG) 0.1 % Apply topically as needed.   nitroGLYCERIN (NITROSTAT) 0.4 MG SL tablet DISSOLVE 1 TABLET UNDER THE  TONGUE EVERY 5 MINUTES AS NEEDED FOR CHEST PAIN. MAX OF 3 TABLETS IN 15 MINUTES. CALL 911 IF PAIN  PERSISTS. (Patient not taking: Reported on 03/16/2023)   No facility-administered encounter medications on file as of 03/16/2023.    Allergies as of 03/16/2023 - Review Complete 03/16/2023   Allergen Reaction Noted   Biaxin [clarithromycin] Rash and Other (See Comments) 02/02/2010   Penicillins Rash and Other (See Comments) 02/02/2010   Hydroxychloroquine Other (See Comments) 10/11/2022   Sulfamethoxazole  12/26/2022   Sulfasalazine Other (See Comments) 10/11/2022   Losartan potassium  10/09/2021    Past Medical History:  Diagnosis Date   Anemia    anemia of chronic disease +/- IDA followed by Dr. Shirline Frees. received iron infusions in the past   Asthma    Borderline glaucoma    CAD (coronary artery disease)    a. s/p CABG 1996. b. low risk nuc 2011. c. LHC 04/2016 due to drop in EF -> occluded native LAD,  widely patent sequential LIMA-D1-LAD.   Chronic kidney disease    "mild stage of kidney disease"   Chronic systolic CHF (congestive heart failure) (HCC)    Complication of anesthesia    DM2 (diabetes mellitus, type 2) (HCC)    not on any medicine for this at this time, 05/2016   Dyspnea    with exertion   GERD (gastroesophageal reflux disease)    Gout    History of blood transfusion    History of hiatal hernia    in 20s   History of pneumonia    March, 2016   HLD (hyperlipidemia)    HTN (hypertension)    Hyperthyroidism 01/21/2016   Inappropriate sinus tachycardia    LBBB (left bundle branch block)    a. Seen in 04/2016   Lymphocytic colitis 04/2020   Macular degeneration    left   Myocardial infarction [facility])    1996   Neuropathy    Neuropathy    NICM (nonischemic cardiomyopathy) (HCC)    a. Dx 04/2016 - EF 25-30%, diffuse HK, elevated LVEDP, mild MR, mod LAE.   Normocytic anemia 10/17/2021   NSVT (nonsustained ventricular tachycardia) (HCC)    PAT (paroxysmal atrial tachycardia)    Pneumonia    PONV (postoperative nausea and vomiting)    "after just about every surgery I've had"   Rheumatoid arthritis (HCC)    RA- hands   Seasonal allergies     Past Surgical History:  Procedure Laterality Date   ABDOMINAL HYSTERECTOMY     APPENDECTOMY     BACK  SURGERY     BIOPSY  04/27/2020   Procedure: BIOPSY;  Surgeon: Corbin Ade, MD;  [facility];  Service: Endoscopy;;  ascending colon biopsy   CARDIAC CATHETERIZATION N/A 05/01/2016   Procedure: Right/Left Heart Cath and Coronary/Graft Angiography;  Surgeon: Marykay Lex, MD;  Location: [facility] INVASIVE CV LAB;  Service: Cardiovascular;  Laterality: N/A;   CHOLECYSTECTOMY     COLONOSCOPY  11/2011   Dr. Magod:ext/int hemorrhoids, diverticulosis sigmoid colon and distal desc colon, mid-desc colon hyperplastic polyps.    COLONOSCOPY WITH PROPOFOL N/A 04/27/2020   Rourk: Diverticulosis, hemorrhoids, normal terminal ileum.  Random colon biopsies significant for chronic lymphocytic colitis. No repeat due to age.  CORONARY ARTERY BYPASS GRAFT  1996   2 vessels   ESOPHAGOGASTRODUODENOSCOPY  11/2011   Dr. Ewing Schlein: small hiatal hernia, one non-bleeding superficial gastric ulcer, medium-sized diverticulum in area of papilla   ESOPHAGOGASTRODUODENOSCOPY N/A 12/29/2015   Dr. Jena Gauss: Chronic inactive gastritis, normal esophagus status post dilation, duodenal diverticula   ESOPHAGOGASTRODUODENOSCOPY (EGD) WITH PROPOFOL N/A 07/24/2019   Dr. Jena Gauss: Normal esophagus status post empiric dilation, small hiatal hernia, large D2 diverticulum   ESOPHAGOGASTRODUODENOSCOPY (EGD) WITH PROPOFOL N/A 06/03/2021   Surgeon: Corbin Ade, MD; normal esophagus, normal stomach, normal duodenum.  Abnormal hypopharyngeal mucosa bilaterally just distal to the base of the tongue (right side greater than left), overlying mucosa appeared irregular and somewhat verrucous in appearance.  Recommended ENT evaluation.   EYE SURGERY Bilateral    cataract removal   FOOT SURGERY     removal bone spur   FRACTURE SURGERY Right    arm   GIVENS CAPSULE STUDY  11/2012   Dr. Ewing Schlein: minimal gastritis, normal small bowel capsule   HERNIA REPAIR     hiatal hernia   ICD IMPLANT N/A 02/04/2021   Procedure: ICD IMPLANT;  Surgeon:  Marinus Maw, MD;  Location: [facility] INVASIVE CV LAB;  Service: Cardiovascular;  Laterality: N/A;   MALONEY DILATION N/A 07/24/2019   Procedure: Elease Hashimoto DILATION;  Surgeon: Corbin Ade, MD;  [facility];  Service: Endoscopy;  Laterality: N/A;   MALONEY DILATION N/A 06/03/2021   Procedure: Elease Hashimoto DILATION;  Surgeon: Corbin Ade, MD;  Location: [facility];  Service: Endoscopy;  Laterality: N/A;   MAXIMUM ACCESS (MAS)POSTERIOR LUMBAR INTERBODY FUSION (PLIF) 1 LEVEL N/A 08/14/2013   Procedure:  MAXIMUM ACCESS SURGERY(MAS) POSTERIOR LUMBAR INTERBODY FUSION LUMBAR THREE-FOUR ;  Surgeon: Tia Alert, MD;  Location: [facility] NEURO ORS;  Service: Neurosurgery;  Laterality: N/A;   MAXIMUM ACCESS SURGERY(MAS) POSTERIOR LUMBAR INTERBODY FUSION LUMBAR THREE-FOUR    PTCA     RIGHT/LEFT HEART CATH AND CORONARY ANGIOGRAPHY N/A 03/26/2020   Procedure: RIGHT/LEFT HEART CATH AND CORONARY ANGIOGRAPHY;  Surgeon: Marykay Lex, MD;  Location: [facility] INVASIVE CV LAB;  Service: Cardiovascular;  Laterality: N/A;   SPINAL CORD STIMULATOR BATTERY EXCHANGE N/A 01/19/2021   Procedure: Spinal cord stimulator battery change;  Surgeon: Renaldo Fiddler, MD;  Location: [facility] OR;  Service: Neurosurgery;  Laterality: N/A;   SPINAL CORD STIMULATOR INSERTION N/A 06/16/2016   Procedure: LUMBAR SPINAL CORD STIMULATOR INSERTION;  Surgeon: Odette Fraction, MD;  Location: [facility]UR[facility] ORS;  Service: Neurosurgery;  Laterality: N/A;  LUMBAR SPINAL CORD STIMULATOR INSERTION   TEE WITHOUT CARDIOVERSION N/A 02/01/2023   Procedure: TRANSESOPHAGEAL ECHOCARDIOGRAM (TEE);  Surgeon: Jonelle Sidle, MD;  Location: [facility] ORS;  Service: Cardiovascular;  Laterality: N/A;   tens  05/2016   TONSILLECTOMY      Family History  Problem Relation Age of Onset   Heart attack Father    Heart attack Mother    Bladder Cancer Brother    Cervical cancer Daughter        ?   Colon cancer Neg Hx     Social History   Socioeconomic History   Marital  status: Married    Spouse name: Not on file   Number of children: 3   Years of education: Not on file   Highest education level: Not on file  Occupational History   Not on file  Tobacco Use   Smoking status: Former    Packs/day: 0.75    Years: 15.00  Additional pack years: 0.00    Total pack years: 11.25    Types: Cigarettes    Quit date: 11/20/1994    Years since quitting: 28.3   Smokeless tobacco: Never   Tobacco comments:    Quit in 1996  Vaping Use   Vaping Use: Never used  Substance and Sexual Activity   Alcohol use: Never    Alcohol/week: 0.0 standard drinks of alcohol   Drug use: Never   Sexual activity: Yes  Other Topics Concern   Not on file  Social History Narrative   2 living children, one deceased age 6   Right handed   One story home   Drinks coffee am   Social Determinants of Health   Financial Resource Strain: Not on file  Food Insecurity: No Food Insecurity (01/28/2023)   Hunger Vital Sign    Worried About Running Out of Food in the Last Year: Never true    Ran Out of Food in the Last Year: Never true  Transportation Needs: No Transportation Needs (01/28/2023)   PRAPARE - Administrator, Civil Service (Medical): No    Lack of Transportation (Non-Medical): No  Physical Activity: Not on file  Stress: Not on file  Social Connections: Not on file  Intimate Partner Violence: Not At Risk (01/28/2023)   Humiliation, Afraid, Rape, and Kick questionnaire    Fear of Current or Ex-Partner: No    Emotionally Abused: No    Physically Abused: No    Sexually Abused: No    Review of systems: Review of Systems  Constitutional: Negative for fever and chills.  HENT: Negative.   Eyes: Negative for blurred vision.  Respiratory: as per HPI  Cardiovascular: Negative for chest pain and palpitations.  Gastrointestinal: Negative for vomiting, diarrhea, blood per rectum. Genitourinary: Negative for dysuria, urgency, frequency and hematuria.   Musculoskeletal: Negative for myalgias, back pain and joint pain.  Skin: Negative for itching and rash.  Neurological: Negative for dizziness, tremors, focal weakness, seizures and loss of consciousness.  Endo/Heme/Allergies: Negative for environmental allergies.  Psychiatric/Behavioral: Negative for depression, suicidal ideas and hallucinations.  All other systems reviewed and are negative.  Physical Exam: Blood pressure 120/60, pulse 75, temperature 98.2 F (36.8 C), temperature source Oral, height 5' 2.5" (1.588 m), weight 130 lb (59 kg), SpO2 96 %. Gen:      No acute distress HEENT:  EOMI, sclera anicteric Neck:     No masses; no thyromegaly Lungs:    Clear to auscultation bilaterally; normal respiratory effort CV:         Regular rate and rhythm; no murmurs Abd:      + bowel sounds; soft, non-tender; no palpable masses, no distension Ext:    No edema; adequate peripheral perfusion Skin:      Warm and dry; no rash Neuro: alert and oriented x 3 Psych: normal mood and affect  Data Reviewed: Imaging: CTA 10/05/2022-no PE, irregular nodule in the right upper lobe, peribronchovascular nodularity  Chest x-ray 02/18/2023-improving left basal airspace disease. I have reviewed reviewed the images personally.  PFTs:  Labs:  Assessment:  Dyspnea Recent admission for post-COVID multilobar pneumonia, Pseudomonas bacteremia, sepsis She is making a slow recovery which we will continue to monitor.  Get chest x-ray today Schedule PFTs in 3 months  She will also need a follow-up CT in 3 months to ensure that her pneumonia is improving and also follow-up on right upper lobe lung nodule that was previously noted on CT scan.  Plan/Recommendations: Chest x-ray, CT, PFTs in 3 months.  Chilton Greathouse MD [facility] 03/16/2023, 3:36 PM  CC: Laurann Montana, MD

## 2023-03-19 ENCOUNTER — Encounter: Payer: Self-pay | Admitting: Pulmonary Disease

## 2023-03-20 DIAGNOSIS — M60242 Foreign body granuloma of soft tissue, not elsewhere classified, left hand: Secondary | ICD-10-CM | POA: Diagnosis not present

## 2023-03-20 DIAGNOSIS — R2232 Localized swelling, mass and lump, left upper limb: Secondary | ICD-10-CM | POA: Diagnosis not present

## 2023-03-20 DIAGNOSIS — M06341 Rheumatoid nodule, right hand: Secondary | ICD-10-CM | POA: Diagnosis not present

## 2023-03-21 NOTE — Addendum Note (Signed)
Addended by: Jacquiline Doe on: 03/21/2023 01:08 PM   Modules accepted: Orders

## 2023-03-26 ENCOUNTER — Encounter (HOSPITAL_COMMUNITY): Payer: Medicare Other

## 2023-03-27 DIAGNOSIS — M0579 Rheumatoid arthritis with rheumatoid factor of multiple sites without organ or systems involvement: Secondary | ICD-10-CM | POA: Diagnosis not present

## 2023-03-30 DIAGNOSIS — M67449 Ganglion, unspecified hand: Secondary | ICD-10-CM | POA: Diagnosis not present

## 2023-04-02 ENCOUNTER — Telehealth: Payer: Self-pay | Admitting: Pulmonary Disease

## 2023-04-02 DIAGNOSIS — R0602 Shortness of breath: Secondary | ICD-10-CM

## 2023-04-02 NOTE — Telephone Encounter (Signed)
I believe she is already on supplemental oxygen at 2 L.  Can you add an order for 2 L oxygen at night?  If she is not on supplemental oxygen then order overnight oximetry on room air.

## 2023-04-02 NOTE — Telephone Encounter (Signed)
Received a call from pt's spouse Chrissie Noa who is requesting to have an order for oxygen sent in for pt.  Chrissie Noa said when pt lays down at night to sleep, pt gets to where she cannot breathe. Due to this, she has been using pt's oxygen concentrator when she lays down to help her breathe.   Do not see where pt has ever had an ONO performed that shows that she does need O2.  Dr. Isaiah Serge, please advise if you want pt to have an ONO performed to see if pt requires O2 at night.

## 2023-04-02 NOTE — Telephone Encounter (Signed)
Called and spoke with patient's husband. He confirmed that the patient does not have oxygen and has been using his oxygen instead. I advised him of the ONO order and he agreed. Order has been placed.   Nothing further needed at time of call.

## 2023-04-03 ENCOUNTER — Encounter: Payer: Self-pay | Admitting: Gastroenterology

## 2023-04-03 ENCOUNTER — Ambulatory Visit: Payer: Medicare Other | Admitting: Gastroenterology

## 2023-04-03 VITALS — BP 100/63 | HR 80 | Temp 97.8°F | Ht 62.5 in | Wt 132.4 lb

## 2023-04-03 DIAGNOSIS — K219 Gastro-esophageal reflux disease without esophagitis: Secondary | ICD-10-CM | POA: Diagnosis not present

## 2023-04-03 DIAGNOSIS — K5900 Constipation, unspecified: Secondary | ICD-10-CM | POA: Diagnosis not present

## 2023-04-03 MED ORDER — LINACLOTIDE 72 MCG PO CAPS: 72.0000 ug | ORAL_CAPSULE | Freq: Every day | ORAL | 5 refills | Status: DC

## 2023-04-03 MED ORDER — LANSOPRAZOLE 30 MG PO CPDR: DELAYED_RELEASE_CAPSULE | ORAL | 5 refills | Status: DC

## 2023-04-03 NOTE — Progress Notes (Signed)
GI Office Note    Referring Provider: Laurann Montana, MD Primary Care Physician:  Laurann Montana, MD  Primary Gastroenterologist: Roetta Sessions, MD   Chief Complaint   Chief Complaint  Patient presents with   Follow-up    History of Present Illness   Katherine Larson is a 77 y.o. female presenting today for follow-up.  Last seen in February.  Patient has a history of lymphocytic colitis.  She has chronic anemia followed by hematology.  At last office visit she was actually dealing with some opioid-induced constipation.  Since her last visit, she was ill with Covid, required hospitalization in 01/2023 for acute respiratory failure with hypoxia, sepsis with PNA and pseudomonas bacteremia. She continues to have oxygen requirements at home. Slowly recovering. From a GI standpoint, she seems to be doing well. BMs are regular. No diarrhea. Occasionally takes Linzess. Has been using samples we provided her back in 12/2022. No melena, brbpr. No heartburn, n/v. Appetite slowly improving.    Colonoscopy June 2021: - Non-bleeding internal hemorrhoids. - Diverticulosis in the sigmoid colon and in the descending colon. Status post segmental biopsy. - The examination was otherwise normal on direct and retroflexion views. Normal appearing terminal ileum. -Biopsy showed chronic lymphocytic colitis   EGD 05/2021: normal esophagus, small hiatal hernia. Abnormal hypopharyngeal area, verrucous in appearance, followed with ENT.   Medications   Current Outpatient Medications  Medication Sig Dispense Refill   albuterol (PROVENTIL HFA;VENTOLIN HFA) 108 (90 Base) MCG/ACT inhaler Take 2 puffs 3 times a day and every 4 hours as needed.     allopurinol (ZYLOPRIM) 300 MG tablet Take 300 mg by mouth daily.     aspirin EC 81 MG EC tablet Take 1 tablet (81 mg total) by mouth daily.     atorvastatin (LIPITOR) 80 MG tablet Take 80 mg by mouth daily.     carvedilol (COREG) 6.25 MG tablet Patient takes 1.5  tablet by mouth twice daily.     cholecalciferol (VITAMIN D3) 25 MCG (1000 UT) tablet Take 1,000 Units by mouth daily.     etanercept (ENBREL SURECLICK) 50 MG/ML injection Inject 50 mg into the skin once a week. For 30 days     FARXIGA 10 MG TABS tablet TAKE 1 TABLET(10 MG) BY MOUTH DAILY BEFORE BREAKFAST 30 tablet 11   ferrous sulfate 325 (65 FE) MG tablet Take 325 mg by mouth daily with breakfast. Every other day     fexofenadine (ALLEGRA) 180 MG tablet Take 180 mg by mouth daily.     furosemide (LASIX) 40 MG tablet Take 40 mg by mouth daily.     gabapentin (NEURONTIN) 300 MG capsule Take 300 mg by mouth 3 (three) times daily.     HYDROcodone-acetaminophen (NORCO) 10-325 MG tablet Take 1 tablet by mouth every 6 (six) hours as needed for moderate pain.     icosapent Ethyl (VASCEPA) 1 g capsule Take 1 capsule (1 g total) by mouth 2 (two) times daily. 120 capsule 10   leflunomide (ARAVA) 20 MG tablet Take 1 tablet (20 mg total) by mouth daily.     losartan (COZAAR) 100 MG tablet Take 100 mg by mouth daily.     MAGNESIUM-OXIDE 400 (240 Mg) MG tablet TAKE 1 TABLET(400 MG) BY MOUTH DAILY 30 tablet 2   meclizine (ANTIVERT) 12.5 MG tablet Take 12.5 mg by mouth 3 (three) times daily as needed for dizziness.     montelukast (SINGULAIR) 10 MG tablet Take 10 mg by mouth at  bedtime.     Multiple Vitamins-Minerals (OCUVITE ADULT 50+ PO) Take 1 tablet by mouth daily.     spironolactone (ALDACTONE) 25 MG tablet Take 0.5 tablets (12.5 mg total) by mouth daily. 45 tablet 3   TRELEGY ELLIPTA 200-62.5-25 MCG/ACT AEPB Inhale 1 puff into the lungs daily.     triamcinolone cream (KENALOG) 0.1 % Apply topically as needed.     lansoprazole (PREVACID) 30 MG capsule Take one capsule once to twice daily before a meal. 60 capsule 5   linaclotide (LINZESS) 72 MCG capsule Take 1 capsule (72 mcg total) by mouth daily before breakfast. As needed for constipation 30 capsule 5   nitroGLYCERIN (NITROSTAT) 0.4 MG SL tablet  DISSOLVE 1 TABLET UNDER THE  TONGUE EVERY 5 MINUTES AS NEEDED FOR CHEST PAIN. MAX OF 3 TABLETS IN 15 MINUTES. CALL 911 IF PAIN  PERSISTS. (Patient not taking: Reported on 03/16/2023) 75 tablet 4   No current facility-administered medications for this visit.    Allergies   Allergies as of 04/03/2023 - Review Complete 04/03/2023  Allergen Reaction Noted   Biaxin [clarithromycin] Rash and Other (See Comments) 02/02/2010   Penicillins Rash and Other (See Comments) 02/02/2010   Hydroxychloroquine Other (See Comments) 10/11/2022   Sulfamethoxazole  12/26/2022   Sulfasalazine Other (See Comments) 10/11/2022   Losartan potassium  10/09/2021    Review of Systems   General: Negative for anorexia, weight loss, fever, chills, fatigue, positive weakness. ENT: Negative for hoarseness, difficulty swallowing, nasal congestion. CV: Negative for chest pain, angina, palpitations, +dyspnea on exertion, peripheral edema.  Respiratory: Negative for dyspnea at rest, positive dyspnea on exertion, neg cough, sputum, wheezing.  GI: See history of present illness. GU:  Negative for dysuria, hematuria, urinary incontinence, urinary frequency, nocturnal urination.  Endo: Negative for unusual weight change.     Physical Exam   BP 100/63 (BP Location: Right Arm, Patient Position: Sitting, Cuff Size: Normal)   Pulse 80   Temp 97.8 F (36.6 C) (Oral)   Ht 5' 2.5" (1.588 m)   Wt 132 lb 6.4 oz (60.1 kg)   BMI 23.83 kg/m    General: Well-nourished, well-developed in no acute distress.  Eyes: No icterus. Mouth: Oropharyngeal mucosa moist and pink  Abdomen: Bowel sounds are normal, nontender, nondistended, no hepatosplenomegaly or masses,  no abdominal bruits or hernia , no rebound or guarding.  Rectal: Not performed Extremities: No lower extremity edema. No clubbing or deformities. Neuro: Alert and oriented x 4   Skin: Warm and dry, no jaundice.   Psych: Alert and cooperative, normal mood and affect.  Labs    Lab Results  Component Value Date   CREATININE 0.98 03/14/2023   BUN 31 (H) 03/14/2023   NA 139 03/14/2023   K 4.4 03/14/2023   CL 100 03/14/2023   CO2 27 03/14/2023   Lab Results  Component Value Date   ALT 18 01/30/2023   AST 17 01/30/2023   ALKPHOS 85 01/30/2023   BILITOT 0.5 01/30/2023   Lab Results  Component Value Date   WBC 13.6 (H) 01/31/2023   HGB 9.1 (L) 01/31/2023   HCT 28.6 (L) 01/31/2023   MCV 100.0 01/31/2023   PLT 178 01/31/2023   Lab Results  Component Value Date   IRON 59 01/08/2023   TIBC 392 01/08/2023   FERRITIN 399 (H) 01/08/2023   Lab Results  Component Value Date   VITAMINB12 2,718 (H) 10/17/2021   Lab Results  Component Value Date   FOLATE 7.3 10/17/2021  Imaging Studies   DG Chest 2 View  Result Date: 03/21/2023 CLINICAL DATA:  77 year old female with shortness of breath EXAM: CHEST - 2 VIEW COMPARISON:  02/16/2023, 01/30/2023, 01/28/2023, 10/04/2022 FINDINGS: Cardiomediastinal silhouette unchanged in size and contour. Surgical changes of median sternotomy and CABG. Unchanged left chest wall pacing device/AICD. Stigmata of emphysema, with increased retrosternal airspace, flattened hemidiaphragms, increased AP diameter, and hyperinflation on the AP view. Coarsened interstitial markings. The peripheral linear opacities in the lungs are more similar to baseline study 10/04/2022, with improving aeration in the bilateral lungs compared to most recent study 02/16/2023. No new airspace disease. No pneumothorax or pleural effusion. Surgical changes in the lumbar region incompletely imaged. Electrodes in the posterior canal of the thoracic spine, unchanged. IMPRESSION: Continued improvement in the aeration of the lungs, with background changes of emphysema and atelectasis/scarring Electronically Signed   By: Gilmer Mor D.O.   On: 03/21/2023 10:38    Assessment   GERD: doing well.  Lymphocytic colitis: in remission  Constipation: intermittent  symptoms, does well with Linzess prn.    PLAN   Continue lansoprazole 30mg  once to twice daily.  Continue Linzess daily as needed. Return ov in one year or call sooner if needed.    Leanna Battles. Melvyn Neth, MHS, PA-C [facility]

## 2023-04-03 NOTE — Patient Instructions (Addendum)
Continue lansoprazole 30mg  once to twice daily for acid reflux. Continue Linzess once daily as needed for constipation. Samples and rx provided.  Return to the office in 1 year or call sooner if needed.

## 2023-04-09 ENCOUNTER — Telehealth: Payer: Self-pay | Admitting: Pulmonary Disease

## 2023-04-09 NOTE — Telephone Encounter (Signed)
[facility] called stating they faxed a "new" way of writing the order for ONO in order for them to fill it. They have not gotten it back- refaxed form just in case it was not received. Please advise

## 2023-04-12 DIAGNOSIS — R0602 Shortness of breath: Secondary | ICD-10-CM | POA: Diagnosis not present

## 2023-04-13 DIAGNOSIS — R2232 Localized swelling, mass and lump, left upper limb: Secondary | ICD-10-CM | POA: Diagnosis not present

## 2023-04-13 DIAGNOSIS — M06342 Rheumatoid nodule, left hand: Secondary | ICD-10-CM | POA: Diagnosis not present

## 2023-04-13 NOTE — Telephone Encounter (Signed)
Dr. Isaiah Serge please advise. Was patient supposed to have ONO?

## 2023-04-13 NOTE — Telephone Encounter (Signed)
Rerouting to triage as high priority as it has been 4 days.

## 2023-04-17 ENCOUNTER — Telehealth: Payer: Self-pay | Admitting: Pulmonary Disease

## 2023-04-17 NOTE — Telephone Encounter (Signed)
Received ONO results via fax from [facility].  Results given to Dr. Isaiah Serge to advise.  Message routed to Dr. Isaiah Serge

## 2023-04-18 ENCOUNTER — Other Ambulatory Visit (HOSPITAL_COMMUNITY): Payer: Self-pay | Admitting: Cardiology

## 2023-04-18 NOTE — Telephone Encounter (Signed)
Overnight oximetry on room air dated 04/12/2023 Time off test 9 hours 26 minutes Nadir O2 sat of 82% Time spent less than 88% 5 hours 5 minutes  Please let the patient know that overnight oximetry does confirm low oxygen levels Please send in an order for 2 L oxygen at night

## 2023-04-18 NOTE — Telephone Encounter (Signed)
ATC patient with ONO results.  Left message for patient to return call when available.

## 2023-04-19 DIAGNOSIS — J449 Chronic obstructive pulmonary disease, unspecified: Secondary | ICD-10-CM | POA: Diagnosis not present

## 2023-04-19 DIAGNOSIS — E1121 Type 2 diabetes mellitus with diabetic nephropathy: Secondary | ICD-10-CM | POA: Diagnosis not present

## 2023-04-19 DIAGNOSIS — I5022 Chronic systolic (congestive) heart failure: Secondary | ICD-10-CM | POA: Diagnosis not present

## 2023-04-19 DIAGNOSIS — J4540 Moderate persistent asthma, uncomplicated: Secondary | ICD-10-CM | POA: Diagnosis not present

## 2023-04-19 DIAGNOSIS — E785 Hyperlipidemia, unspecified: Secondary | ICD-10-CM | POA: Diagnosis not present

## 2023-04-19 NOTE — Telephone Encounter (Signed)
Called the pt and there was no answer- LMTCB    

## 2023-04-20 ENCOUNTER — Encounter: Payer: Self-pay | Admitting: *Deleted

## 2023-04-20 NOTE — Telephone Encounter (Signed)
Due to not being able to reach pt after trying multiple times, letter will be sent to pt and encounter will be closed.

## 2023-05-01 ENCOUNTER — Encounter: Payer: Self-pay | Admitting: Pulmonary Disease

## 2023-05-04 ENCOUNTER — Ambulatory Visit (INDEPENDENT_AMBULATORY_CARE_PROVIDER_SITE_OTHER): Payer: Medicare Other

## 2023-05-04 DIAGNOSIS — I255 Ischemic cardiomyopathy: Secondary | ICD-10-CM

## 2023-05-08 LAB — CUP PACEART REMOTE DEVICE CHECK
Date Time Interrogation Session: 20240618131529
Implantable Lead Connection Status: 753985
Implantable Lead Implant Date: 20220318
Implantable Lead Location: 753860
Implantable Pulse Generator Implant Date: 20220318
Pulse Gen Serial Number: 810021948

## 2023-05-09 ENCOUNTER — Other Ambulatory Visit (HOSPITAL_COMMUNITY): Payer: Self-pay

## 2023-05-09 MED ORDER — DAPAGLIFLOZIN PROPANEDIOL 10 MG PO TABS: ORAL_TABLET | ORAL | 10 refills | Status: DC

## 2023-05-22 NOTE — Progress Notes (Signed)
Remote ICD transmission.   

## 2023-05-28 DIAGNOSIS — M180 Bilateral primary osteoarthritis of first carpometacarpal joints: Secondary | ICD-10-CM | POA: Diagnosis not present

## 2023-05-28 DIAGNOSIS — M67442 Ganglion, left hand: Secondary | ICD-10-CM | POA: Diagnosis not present

## 2023-05-28 DIAGNOSIS — M06342 Rheumatoid nodule, left hand: Secondary | ICD-10-CM | POA: Diagnosis not present

## 2023-05-29 DIAGNOSIS — E1121 Type 2 diabetes mellitus with diabetic nephropathy: Secondary | ICD-10-CM | POA: Diagnosis not present

## 2023-05-29 DIAGNOSIS — J4540 Moderate persistent asthma, uncomplicated: Secondary | ICD-10-CM | POA: Diagnosis not present

## 2023-05-29 DIAGNOSIS — M170 Bilateral primary osteoarthritis of knee: Secondary | ICD-10-CM | POA: Diagnosis not present

## 2023-05-29 DIAGNOSIS — E785 Hyperlipidemia, unspecified: Secondary | ICD-10-CM | POA: Diagnosis not present

## 2023-05-29 DIAGNOSIS — M1711 Unilateral primary osteoarthritis, right knee: Secondary | ICD-10-CM | POA: Diagnosis not present

## 2023-05-29 DIAGNOSIS — M25561 Pain in right knee: Secondary | ICD-10-CM | POA: Diagnosis not present

## 2023-05-29 DIAGNOSIS — I5022 Chronic systolic (congestive) heart failure: Secondary | ICD-10-CM | POA: Diagnosis not present

## 2023-05-29 DIAGNOSIS — J449 Chronic obstructive pulmonary disease, unspecified: Secondary | ICD-10-CM | POA: Diagnosis not present

## 2023-05-30 ENCOUNTER — Ambulatory Visit (HOSPITAL_COMMUNITY)
Admission: RE | Admit: 2023-05-30 | Discharge: 2023-05-30 | Disposition: A | Discharge status: Home / Self Care | Payer: Medicare Other | Source: Ambulatory Visit | Attending: Pulmonary Disease | Admitting: Pulmonary Disease

## 2023-05-30 DIAGNOSIS — R0602 Shortness of breath: Secondary | ICD-10-CM

## 2023-05-30 DIAGNOSIS — J189 Pneumonia, unspecified organism: Secondary | ICD-10-CM

## 2023-05-31 ENCOUNTER — Ambulatory Visit (HOSPITAL_COMMUNITY)
Admission: RE | Admit: 2023-05-31 | Discharge: 2023-05-31 | Disposition: A | Discharge status: Home / Self Care | Payer: Medicare Other | Source: Ambulatory Visit | Attending: Pulmonary Disease | Admitting: Pulmonary Disease

## 2023-05-31 DIAGNOSIS — R0602 Shortness of breath: Secondary | ICD-10-CM | POA: Diagnosis not present

## 2023-05-31 DIAGNOSIS — J189 Pneumonia, unspecified organism: Secondary | ICD-10-CM | POA: Insufficient documentation

## 2023-05-31 DIAGNOSIS — R918 Other nonspecific abnormal finding of lung field: Secondary | ICD-10-CM | POA: Diagnosis not present

## 2023-05-31 DIAGNOSIS — J984 Other disorders of lung: Secondary | ICD-10-CM | POA: Diagnosis not present

## 2023-05-31 DIAGNOSIS — I700 Atherosclerosis of aorta: Secondary | ICD-10-CM | POA: Diagnosis not present

## 2023-06-01 LAB — POCT I-STAT CREATININE: Creatinine, Ser: 1.1 mg/dL — ABNORMAL HIGH (ref 0.44–1.00)

## 2023-06-06 ENCOUNTER — Ambulatory Visit (INDEPENDENT_AMBULATORY_CARE_PROVIDER_SITE_OTHER): Payer: Medicare Other | Admitting: Pulmonary Disease

## 2023-06-06 ENCOUNTER — Ambulatory Visit: Payer: Medicare Other | Admitting: Pulmonary Disease

## 2023-06-06 ENCOUNTER — Encounter: Payer: Self-pay | Admitting: Pulmonary Disease

## 2023-06-06 VITALS — BP 112/62 | HR 84 | Temp 97.9°F | Ht 62.5 in | Wt 126.0 lb

## 2023-06-06 DIAGNOSIS — R0602 Shortness of breath: Secondary | ICD-10-CM

## 2023-06-06 DIAGNOSIS — G4734 Idiopathic sleep related nonobstructive alveolar hypoventilation: Secondary | ICD-10-CM

## 2023-06-06 LAB — PULMONARY FUNCTION TEST
DL/VA % pred: 93 %
DL/VA: 3.87 ml/min/mmHg/L
DLCO cor % pred: 81 %
DLCO cor: 14.83 ml/min/mmHg
DLCO unc % pred: 81 %
DLCO unc: 14.83 ml/min/mmHg
FEF 25-75 Post: 1.86 L/sec
FEF 25-75 Pre: 1.57 L/sec
FEF2575-%Change-Post: 18 %
FEF2575-%Pred-Post: 118 %
FEF2575-%Pred-Pre: 99 %
FEV1-%Change-Post: 6 %
FEV1-%Pred-Post: 84 %
FEV1-%Pred-Pre: 79 %
FEV1-Post: 1.67 L
FEV1-Pre: 1.56 L
FEV1FVC-%Change-Post: 4 %
FEV1FVC-%Pred-Pre: 104 %
FEV6-%Change-Post: 2 %
FEV6-%Pred-Post: 81 %
FEV6-%Pred-Pre: 79 %
FEV6-Post: 2.04 L
FEV6-Pre: 1.99 L
FEV6FVC-%Pred-Post: 105 %
FEV6FVC-%Pred-Pre: 105 %
FVC-%Change-Post: 2 %
FVC-%Pred-Post: 77 %
FVC-%Pred-Pre: 75 %
FVC-Post: 2.04 L
FVC-Pre: 1.99 L
Post FEV1/FVC ratio: 82 %
Post FEV6/FVC ratio: 100 %
Pre FEV1/FVC ratio: 78 %
Pre FEV6/FVC Ratio: 100 %
RV % pred: 80 %
RV: 1.78 L
TLC % pred: 81 %
TLC: 3.92 L

## 2023-06-06 NOTE — Patient Instructions (Signed)
 Full PFT performed today. °

## 2023-06-06 NOTE — Progress Notes (Signed)
 Full PFT performed today. °

## 2023-06-06 NOTE — Progress Notes (Signed)
Katherine Larson    [id number]    1946-08-04  Primary Care Physician:White, Aram Beecham, MD  Referring Physician: Laurann Montana, MD [phone] [place],  [place] [address]  Chief complaint: Consult for dyspnea  HPI: 77 y.o.  with a history of coronary artery disease, hypertension, hyperlipidemia, HFrEF (EF 30-35%) status post ICD, CKD stage IIIa, GERD, lymphocytic colitis, and left bundle branch block   Here for evaluation of dyspnea for the past 3 months.  She was diagnosed with COVID-19 pneumonia on 01/09/2023 which was treated as an outpatient with Paxlovid but then had worsening dyspnea and was hospitalized hospitalized on 02/07/2023 with acute respiratory failure with bilateral multilobar pneumonia, Pseudomonas bacteremia, sepsis with elevated troponins.  Admitted and required BiPAP.  Treated with antibiotics, IV fluids  Post discharge she has made some recovery but continues to have symptoms of dyspnea.  Denies any cough, sputum production, fevers or chills  Pets: No pets Occupation: Retired Garment/textile technologist Exposures: No mold, hot tub, Jacuzzi.  No feather pillows or comforters Smoking history: For 15 pack years.  Quit in 1996 Travel history: No significant travel history Relevant family history: No family history of lung disease   Outpatient Encounter Medications as of 06/06/2023  Medication Sig   albuterol (PROVENTIL HFA;VENTOLIN HFA) 108 (90 Base) MCG/ACT inhaler Take 2 puffs 3 times a day and every 4 hours as needed.   allopurinol (ZYLOPRIM) 300 MG tablet Take 300 mg by mouth daily.   aspirin EC 81 MG EC tablet Take 1 tablet (81 mg total) by mouth daily.   atorvastatin (LIPITOR) 80 MG tablet Take 80 mg by mouth daily.   carvedilol (COREG) 6.25 MG tablet Patient takes 1.5 tablet by mouth twice daily.   cholecalciferol (VITAMIN D3) 25 MCG (1000 UT) tablet Take 1,000 Units by mouth daily.   dapagliflozin propanediol (FARXIGA) 10 MG TABS tablet TAKE 1  TABLET(10 MG) BY MOUTH DAILY BEFORE BREAKFAST   etanercept (ENBREL SURECLICK) 50 MG/ML injection Inject 50 mg into the skin once a week. For 30 days   ferrous sulfate 325 (65 FE) MG tablet Take 325 mg by mouth daily with breakfast. Every other day   fexofenadine (ALLEGRA) 180 MG tablet Take 180 mg by mouth daily.   furosemide (LASIX) 40 MG tablet Take 40 mg by mouth daily.   gabapentin (NEURONTIN) 300 MG capsule Take 300 mg by mouth 3 (three) times daily.   HYDROcodone-acetaminophen (NORCO) 10-325 MG tablet Take 1 tablet by mouth every 6 (six) hours as needed for moderate pain.   icosapent Ethyl (VASCEPA) 1 g capsule Take 1 capsule (1 g total) by mouth 2 (two) times daily.   lansoprazole (PREVACID) 30 MG capsule Take one capsule once to twice daily before a meal.   leflunomide (ARAVA) 20 MG tablet Take 1 tablet (20 mg total) by mouth daily.   linaclotide (LINZESS) 72 MCG capsule Take 1 capsule (72 mcg total) by mouth daily before breakfast. As needed for constipation   losartan (COZAAR) 100 MG tablet Take 100 mg by mouth daily.   MAGNESIUM-OXIDE 400 (240 Mg) MG tablet TAKE 1 TABLET(400 MG) BY MOUTH DAILY   meclizine (ANTIVERT) 12.5 MG tablet Take 12.5 mg by mouth 3 (three) times daily as needed for dizziness.   montelukast (SINGULAIR) 10 MG tablet Take 10 mg by mouth at bedtime.   Multiple Vitamins-Minerals (OCUVITE ADULT 50+ PO) Take 1 tablet by mouth daily.   nitroGLYCERIN (NITROSTAT) 0.4 MG SL  tablet DISSOLVE 1 TABLET UNDER THE  TONGUE EVERY 5 MINUTES AS NEEDED FOR CHEST PAIN. MAX OF 3 TABLETS IN 15 MINUTES. CALL 911 IF PAIN  PERSISTS.   spironolactone (ALDACTONE) 25 MG tablet Take 0.5 tablets (12.5 mg total) by mouth daily.   TRELEGY ELLIPTA 200-62.5-25 MCG/ACT AEPB Inhale 1 puff into the lungs daily.   triamcinolone cream (KENALOG) 0.1 % Apply topically as needed.   No facility-administered encounter medications on file as of 06/06/2023.    Allergies as of 06/06/2023 - Review Complete  06/06/2023  Allergen Reaction Noted   Biaxin [clarithromycin] Rash and Other (See Comments) 02/02/2010   Penicillins Rash and Other (See Comments) 02/02/2010   Hydroxychloroquine Other (See Comments) 10/11/2022   Sulfamethoxazole  12/26/2022   Sulfasalazine Other (See Comments) 10/11/2022   Losartan potassium  10/09/2021    Past Medical History:  Diagnosis Date   Anemia    anemia of chronic disease +/- IDA followed by Dr. Shirline Frees. received iron infusions in the past   Asthma    Borderline glaucoma    CAD (coronary artery disease)    a. s/p CABG 1996. b. low risk nuc 2011. c. LHC 04/2016 due to drop in EF -> occluded native LAD,  widely patent sequential LIMA-D1-LAD.   Chronic kidney disease    "mild stage of kidney disease"   Chronic systolic CHF (congestive heart failure) (HCC)    Complication of anesthesia    DM2 (diabetes mellitus, type 2) (HCC)    not on any medicine for this at this time, 05/2016   Dyspnea    with exertion   GERD (gastroesophageal reflux disease)    Gout    History of blood transfusion    History of hiatal hernia    in 20s   History of pneumonia    March, 2016   HLD (hyperlipidemia)    HTN (hypertension)    Hyperthyroidism 01/21/2016   Inappropriate sinus tachycardia    LBBB (left bundle branch block)    a. Seen in 04/2016   Lymphocytic colitis 04/2020   Macular degeneration    left   Myocardial infarction [facility])    1996   Neuropathy    Neuropathy    NICM (nonischemic cardiomyopathy) (HCC)    a. Dx 04/2016 - EF 25-30%, diffuse HK, elevated LVEDP, mild MR, mod LAE.   Normocytic anemia 10/17/2021   NSVT (nonsustained ventricular tachycardia) (HCC)    PAT (paroxysmal atrial tachycardia)    Pneumonia    PONV (postoperative nausea and vomiting)    "after just about every surgery I've had"   Rheumatoid arthritis (HCC)    RA- hands   Seasonal allergies     Past Surgical History:  Procedure Laterality Date   ABDOMINAL HYSTERECTOMY     APPENDECTOMY      BACK SURGERY     BIOPSY  04/27/2020   Procedure: BIOPSY;  Surgeon: Corbin Ade, MD;  Location: [facility];  Service: Endoscopy;;  ascending colon biopsy   CARDIAC CATHETERIZATION N/A 05/01/2016   Procedure: Right/Left Heart Cath and Coronary/Graft Angiography;  Surgeon: Marykay Lex, MD;  Location: [facility] INVASIVE CV LAB;  Service: Cardiovascular;  Laterality: N/A;   CHOLECYSTECTOMY     COLONOSCOPY  11/2011   Dr. Magod:ext/int hemorrhoids, diverticulosis sigmoid colon and distal desc colon, mid-desc colon hyperplastic polyps.    COLONOSCOPY WITH PROPOFOL N/A 04/27/2020   Rourk: Diverticulosis, hemorrhoids, normal terminal ileum.  Random colon biopsies significant for chronic lymphocytic colitis. No repeat due to age.  CORONARY ARTERY BYPASS GRAFT  1996   2 vessels   ESOPHAGOGASTRODUODENOSCOPY  11/2011   Dr. Ewing Schlein: small hiatal hernia, one non-bleeding superficial gastric ulcer, medium-sized diverticulum in area of papilla   ESOPHAGOGASTRODUODENOSCOPY N/A 12/29/2015   Dr. Jena Gauss: Chronic inactive gastritis, normal esophagus status post dilation, duodenal diverticula   ESOPHAGOGASTRODUODENOSCOPY (EGD) WITH PROPOFOL N/A 07/24/2019   Dr. Jena Gauss: Normal esophagus status post empiric dilation, small hiatal hernia, large D2 diverticulum   ESOPHAGOGASTRODUODENOSCOPY (EGD) WITH PROPOFOL N/A 06/03/2021   Surgeon: Corbin Ade, MD; normal esophagus, normal stomach, normal duodenum.  Abnormal hypopharyngeal mucosa bilaterally just distal to the base of the tongue (right side greater than left), overlying mucosa appeared irregular and somewhat verrucous in appearance.  Recommended ENT evaluation.   EYE SURGERY Bilateral    cataract removal   FOOT SURGERY     removal bone spur   FRACTURE SURGERY Right    arm   GIVENS CAPSULE STUDY  11/2012   Dr. Ewing Schlein: minimal gastritis, normal small bowel capsule   HERNIA REPAIR     hiatal hernia   ICD IMPLANT N/A 02/04/2021   Procedure: ICD IMPLANT;   Surgeon: Marinus Maw, MD;  Location: [facility] INVASIVE CV LAB;  Service: Cardiovascular;  Laterality: N/A;   MALONEY DILATION N/A 07/24/2019   Procedure: Elease Hashimoto DILATION;  Surgeon: Corbin Ade, MD;  Location: [facility];  Service: Endoscopy;  Laterality: N/A;   MALONEY DILATION N/A 06/03/2021   Procedure: Elease Hashimoto DILATION;  Surgeon: Corbin Ade, MD;  Location: [facility];  Service: Endoscopy;  Laterality: N/A;   MAXIMUM ACCESS (MAS)POSTERIOR LUMBAR INTERBODY FUSION (PLIF) 1 LEVEL N/A 08/14/2013   Procedure:  MAXIMUM ACCESS SURGERY(MAS) POSTERIOR LUMBAR INTERBODY FUSION LUMBAR THREE-FOUR ;  Surgeon: Tia Alert, MD;  Location: [facility];  Service: Neurosurgery;  Laterality: N/A;   MAXIMUM ACCESS SURGERY(MAS) POSTERIOR LUMBAR INTERBODY FUSION LUMBAR THREE-FOUR    PTCA     RIGHT/LEFT HEART CATH AND CORONARY ANGIOGRAPHY N/A 03/26/2020   Procedure: RIGHT/LEFT HEART CATH AND CORONARY ANGIOGRAPHY;  Surgeon: Marykay Lex, MD;  Location: [facility] INVASIVE CV LAB;  Service: Cardiovascular;  Laterality: N/A;   SPINAL CORD STIMULATOR BATTERY EXCHANGE N/A 01/19/2021   Procedure: Spinal cord stimulator battery change;  Surgeon: Renaldo Fiddler, MD;  Location: [facility] OR;  Service: Neurosurgery;  Laterality: N/A;   SPINAL CORD STIMULATOR INSERTION N/A 06/16/2016   Procedure: LUMBAR SPINAL CORD STIMULATOR INSERTION;  Surgeon: Odette Fraction, MD;  Location: [facility];  Service: Neurosurgery;  Laterality: N/A;  LUMBAR SPINAL CORD STIMULATOR INSERTION   TEE WITHOUT CARDIOVERSION N/A 02/01/2023   Procedure: TRANSESOPHAGEAL ECHOCARDIOGRAM (TEE);  Surgeon: Jonelle Sidle, MD;  Location: [facility] OR[facility];  Service: Cardiovascular;  Laterality: N/A;   tens  05/2016   TONSILLECTOMY      Family History  Problem Relation Age of Onset   Heart attack Father    Heart attack Mother    Bladder Cancer Brother    Cervical cancer Daughter        ?   Colon cancer Neg Hx     Social History   Socioeconomic History    Marital status: Married    Spouse name: Not on file   Number of children: 3   Years of education: Not on file   Highest education level: Not on file  Occupational History   Not on file  Tobacco Use   Smoking status: Former    Current packs/day: 0.00    Average packs/day:  0.8 packs/day for 15.0 years (11.3 ttl pk-yrs)    Types: Cigarettes    Start date: 11/21/1979    Quit date: 11/20/1994    Years since quitting: 28.5   Smokeless tobacco: Never   Tobacco comments:    Quit in 1996  Vaping Use   Vaping status: Never Used  Substance and Sexual Activity   Alcohol use: Never    Alcohol/week: 0.0 standard drinks of alcohol   Drug use: Never   Sexual activity: Yes  Other Topics Concern   Not on file  Social History Narrative   2 living children, one deceased age 57   Right handed   One story home   Drinks coffee am   Social Determinants of Health   Financial Resource Strain: Not on file  Food Insecurity: No Food Insecurity (01/28/2023)   Hunger Vital Sign    Worried About Running Out of Food in the Last Year: Never true    Ran Out of Food in the Last Year: Never true  Transportation Needs: No Transportation Needs (01/28/2023)   PRAPARE - Administrator, Civil Service (Medical): No    Lack of Transportation (Non-Medical): No  Physical Activity: Not on file  Stress: Not on file  Social Connections: Not on file  Intimate Partner Violence: Not At Risk (01/28/2023)   Humiliation, Afraid, Rape, and Kick questionnaire    Fear of Current or Ex-Partner: No    Emotionally Abused: No    Physically Abused: No    Sexually Abused: No    Review of systems: Review of Systems  Constitutional: Negative for fever and chills.  HENT: Negative.   Eyes: Negative for blurred vision.  Respiratory: as per HPI  Cardiovascular: Negative for chest pain and palpitations.  Gastrointestinal: Negative for vomiting, diarrhea, blood per rectum. Genitourinary: Negative for dysuria, urgency,  frequency and hematuria.  Musculoskeletal: Negative for myalgias, back pain and joint pain.  Skin: Negative for itching and rash.  Neurological: Negative for dizziness, tremors, focal weakness, seizures and loss of consciousness.  Endo/Heme/Allergies: Negative for environmental allergies.  Psychiatric/Behavioral: Negative for depression, suicidal ideas and hallucinations.  All other systems reviewed and are negative.  Physical Exam: Blood pressure 120/60, pulse 75, temperature 98.2 F (36.8 C), temperature source Oral, height 5' 2.5" (1.588 m), weight 130 lb (59 kg), SpO2 96 %. Gen:      No acute distress HEENT:  EOMI, sclera anicteric Neck:     No masses; no thyromegaly Lungs:    Clear to auscultation bilaterally; normal respiratory effort CV:         Regular rate and rhythm; no murmurs Abd:      + bowel sounds; soft, non-tender; no palpable masses, no distension Ext:    No edema; adequate peripheral perfusion Skin:      Warm and dry; no rash Neuro: alert and oriented x 3 Psych: normal mood and affect  Data Reviewed: Imaging: CTA 10/05/2022-no PE, irregular nodule in the right upper lobe, peribronchovascular nodularity  Chest x-ray 02/18/2023-improving left basal airspace disease.  CT high-resolution 05/29/2023-no interstitial lung disease, ill-defined nodularity and groundglass in the right upper lobe I have reviewed reviewed the images personally.  PFTs: 06/06/2023 FVC 2.04 [77%], FEV1 1.67 [84%], F/F82, TLC 3.92 [81%], DLCO 14.83 [81%] Normal test, variability in flow loop  Labs:  Sleep: Overnight oximetry on room air dated 04/12/2023 Time off test 9 hours 26 minutes Nadir O2 sat of 82% Time spent less than 88% 5 hours 5 minutes  Assessment:  Dyspnea Post-COVID multilobar pneumonia, Pseudomonas bacteremia, sepsis She is making a slow recovery which we will continue to monitor.   Follow-up CT shows some mild nodularity in the right upper lobe.  As she is asymptomatic  will not treat with antibiotics at present  Follow-up CT in 6 months  Nocturnal hypoxia Overnight oximetry with low oxygen levels at night.  Supplemental oxygen ordered.  Plan/Recommendations: Start supplemental oxygen at night Follow-up CT in 6 months  Chilton Greathouse MD [facility] 06/06/2023, 11:08 AM  CC: Laurann Montana, MD

## 2023-06-06 NOTE — Patient Instructions (Signed)
Will order 2 L supplemental oxygen at night based on overnight oximetry earlier this year Order CT chest without contrast in 6 months Return to clinic in 6 months

## 2023-06-06 NOTE — Addendum Note (Signed)
Addended by: Jacquiline Doe on: 06/06/2023 11:26 AM   Modules accepted: Orders

## 2023-06-07 DIAGNOSIS — H40013 Open angle with borderline findings, low risk, bilateral: Secondary | ICD-10-CM | POA: Diagnosis not present

## 2023-06-07 DIAGNOSIS — H353132 Nonexudative age-related macular degeneration, bilateral, intermediate dry stage: Secondary | ICD-10-CM | POA: Diagnosis not present

## 2023-06-07 DIAGNOSIS — H35342 Macular cyst, hole, or pseudohole, left eye: Secondary | ICD-10-CM | POA: Diagnosis not present

## 2023-06-07 DIAGNOSIS — H524 Presbyopia: Secondary | ICD-10-CM | POA: Diagnosis not present

## 2023-06-07 DIAGNOSIS — E119 Type 2 diabetes mellitus without complications: Secondary | ICD-10-CM | POA: Diagnosis not present

## 2023-06-11 DIAGNOSIS — G4730 Sleep apnea, unspecified: Secondary | ICD-10-CM | POA: Diagnosis not present

## 2023-06-12 NOTE — Progress Notes (Signed)
PCP: Laurann Montana, MD Cardiology: Dr. Eden Emms HF Cardiology: Dr. Shirlee Latch  77 y.o. with history of CAD s/p CABG and ischemic cardiomyopathy was referred by Dr. Eden Emms for evaluation of CHF.  Patient has a long h/o heart disease.  In 1996, she had CABG with sequential LIMA-LAD and diagonal.  Last cath in 5/21 showed totally occluded LAD with patent LIMA-LAD and diagonal, minimal disease in LCx and RCA. LV systolic function has been reduced.  She has a Secondary school teacher ICD.  Last echo in 9/22 showed EF 25-30%, low normal RV function, moderate MR.  She has a chronic LBBB.  She additionally has rheumatoid arthritis and significant low back pain for which she has a spinal stimulator.   She tried to take [facility] in the past and was very tired/fatigued on it.  She was unable to tolerate it even at low dose and stopped it.  She was lightheaded when we tried to increase Coreg to 12.5 mg bid.   Follow up 1/24, NYHA II and volume stable. 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 ID and Cardiology consulted, and underwent TEE, showing LVEF 30-35%, normal RV, no LAA thrombus, no valvular vegetations. She was continued on 2 weeks of ciprofloxacin, losartan stopped with low BP, and she was discharged home, weight 131 lbs.  Today she returns for post hospital HF follow up with her husband. Overall feeling fine. She wears 2L oxygen at night. She has dyspnea walking up inclines or walking further distances on flat ground. She is chronically dizzy and takes meclizine. She fatigues easily. Denies palpitations, CP, edema, or PND/Orthopnea. Appetite ok. No fever or chills. Weight at home 128 pounds. Taking all medications. Has new patient Pulmonology appt soon.  ECG (personally reviewed): NSR LBBB QRS 148 msec, artifact from spinal  stimulator  Labs (11/22): K 4.9, creatinine 0.95 Labs (2/23): K 4.7, creatinine 1.06 Labs (4/23): K 4.5, creatinine 1.21, LDL 45, HDL 27, TGs 238 Labs (7/23): LDL 42, TGs 200 Labs (1/24): K 4.4, creatinine 0.81, hgb 10.8 Labs (3/24): K 4.2, creatinine 0.95  St Jude ICD: CoreVue stable.  PMH: 1. HTN 2. Hyperlipidemia 3. GERD 4. Gout 5. Fe deficiency anemia 6. CAD: CABG in 1996 with sequential LIMA-diagonal and LAD.  - LHC (5/21): totally occluded LAD with patent sequent LIMA-D and LAD, no significant LCx or RCA disease.  7. Chronic systolic CHF: Ischemic cardiomyopathy.  St Jude ICD.  - Echo (9/22): EF 25-30%, low normal RV function, moderate MR.  - Echo (2/24): EF 30-35%, normal RV - TEE (3/24): LVEF 30-35%, normal RV, no LAA thrombus, no valvular vegetations. 8. Low back pain: Arthritis, has spinal stimulator.  9. Chronic LBBB 10. Rheumatoid arthritis.  11. COPD: Prior smoker.   Social History   Socioeconomic History   Marital status: Married    Spouse name: Not on file   Number of children: 3   Years of education: Not on file   Highest education level: Not on file  Occupational History   Not on file  Tobacco Use   Smoking status: Former    Current packs/day: 0.00    Average packs/day: 0.8 packs/day for 15.0 years (11.3 ttl pk-yrs)    Types: Cigarettes    Start date: 11/21/1979    Quit date: 11/20/1994    Years since quitting: 28.5  Smokeless tobacco: Never   Tobacco comments:    Quit in 1996  Vaping Use   Vaping status: Never Used  Substance and Sexual Activity   Alcohol use: Never    Alcohol/week: 0.0 standard drinks of alcohol   Drug use: Never   Sexual activity: Yes  Other Topics Concern   Not on file  Social History Narrative   2 living children, one deceased age 3   Right handed   One story home   Drinks coffee am   Social Determinants of Health   Financial Resource Strain: Not on file  Food Insecurity: No Food Insecurity (01/28/2023)   Hunger  Vital Sign    Worried About Running Out of Food in the Last Year: Never true    Ran Out of Food in the Last Year: Never true  Transportation Needs: No Transportation Needs (01/28/2023)   PRAPARE - Administrator, Civil Service (Medical): No    Lack of Transportation (Non-Medical): No  Physical Activity: Not on file  Stress: Not on file  Social Connections: Not on file  Intimate Partner Violence: Not At Risk (01/28/2023)   Humiliation, Afraid, Rape, and Kick questionnaire    Fear of Current or Ex-Partner: No    Emotionally Abused: No    Physically Abused: No    Sexually Abused: No   Family History  Problem Relation Age of Onset   Heart attack Father    Heart attack Mother    Bladder Cancer Brother    Cervical cancer Daughter        ?   Colon cancer Neg Hx    ROS: All systems reviewed and negative except as per HPI.   Current Outpatient Medications  Medication Sig Dispense Refill   albuterol (PROVENTIL HFA;VENTOLIN HFA) 108 (90 Base) MCG/ACT inhaler Take 2 puffs 3 times a day and every 4 hours as needed.     allopurinol (ZYLOPRIM) 300 MG tablet Take 300 mg by mouth daily.     aspirin EC 81 MG EC tablet Take 1 tablet (81 mg total) by mouth daily.     atorvastatin (LIPITOR) 80 MG tablet Take 80 mg by mouth daily.     carvedilol (COREG) 6.25 MG tablet Patient takes 1.5 tablet by mouth twice daily.     cholecalciferol (VITAMIN D3) 25 MCG (1000 UT) tablet Take 1,000 Units by mouth daily.     dapagliflozin propanediol (FARXIGA) 10 MG TABS tablet TAKE 1 TABLET(10 MG) BY MOUTH DAILY BEFORE BREAKFAST 30 tablet 10   etanercept (ENBREL SURECLICK) 50 MG/ML injection Inject 50 mg into the skin once a week. For 30 days     ferrous sulfate 325 (65 FE) MG tablet Take 325 mg by mouth daily with breakfast. Every other day     fexofenadine (ALLEGRA) 180 MG tablet Take 180 mg by mouth daily.     furosemide (LASIX) 40 MG tablet Take 40 mg by mouth daily.     gabapentin (NEURONTIN) 300 MG  capsule Take 300 mg by mouth 3 (three) times daily.     HYDROcodone-acetaminophen (NORCO) 10-325 MG tablet Take 1 tablet by mouth every 6 (six) hours as needed for moderate pain.     icosapent Ethyl (VASCEPA) 1 g capsule Take 1 capsule (1 g total) by mouth 2 (two) times daily. 120 capsule 10   lansoprazole (PREVACID) 30 MG capsule Take one capsule once to twice daily before a meal. 60 capsule 5   leflunomide (ARAVA) 20 MG tablet Take 1 tablet (  20 mg total) by mouth daily.     linaclotide (LINZESS) 72 MCG capsule Take 1 capsule (72 mcg total) by mouth daily before breakfast. As needed for constipation 30 capsule 5   losartan (COZAAR) 100 MG tablet Take 100 mg by mouth daily.     MAGNESIUM-OXIDE 400 (240 Mg) MG tablet TAKE 1 TABLET(400 MG) BY MOUTH DAILY 30 tablet 2   meclizine (ANTIVERT) 12.5 MG tablet Take 12.5 mg by mouth 3 (three) times daily as needed for dizziness.     montelukast (SINGULAIR) 10 MG tablet Take 10 mg by mouth at bedtime.     Multiple Vitamins-Minerals (OCUVITE ADULT 50+ PO) Take 1 tablet by mouth daily.     nitroGLYCERIN (NITROSTAT) 0.4 MG SL tablet DISSOLVE 1 TABLET UNDER THE  TONGUE EVERY 5 MINUTES AS NEEDED FOR CHEST PAIN. MAX OF 3 TABLETS IN 15 MINUTES. CALL 911 IF PAIN  PERSISTS. 75 tablet 4   spironolactone (ALDACTONE) 25 MG tablet Take 0.5 tablets (12.5 mg total) by mouth daily. 45 tablet 3   TRELEGY ELLIPTA 200-62.5-25 MCG/ACT AEPB Inhale 1 puff into the lungs daily.     triamcinolone cream (KENALOG) 0.1 % Apply topically as needed.     No current facility-administered medications for this visit.   Wt Readings from Last 3 Encounters:  06/06/23 57.2 kg (126 lb)  04/03/23 60.1 kg (132 lb 6.4 oz)  03/16/23 59 kg (130 lb)   There were no vitals taken for this visit. Physical Exam General:  NAD. No resp difficulty, walked into clinic HEENT: Normal Neck: Supple. No JVD. Carotids 2+ bilat; no bruits. No lymphadenopathy or thryomegaly appreciated. Cor: PMI  nondisplaced. Regular rate & rhythm. No rubs, gallops or murmurs. Lungs: Clear Abdomen: Soft, nontender, nondistended. No hepatosplenomegaly. No bruits or masses. Good bowel sounds. Extremities: No cyanosis, clubbing, rash, edema Neuro: Alert & oriented x 3, cranial nerves grossly intact. Moves all 4 extremities w/o difficulty. Affect pleasant.  Assessment/Plan: 1. Chronic systolic CHF: Probably ischemic cardiomyopathy (h/o occluded LAD, s/p CABG). Coronaries have been unchanged since CABG in 1996. However, EF has declined over the years and I worry that another process may be present.  Unable to obtain cardiac MRI with spinal cord stimulator. 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 TEE (3/24) showed LVEF 30-35%, normal RV. NYHA class II symptoms. She is not volume overloaded by exam or CorVue. - Continue spironolactone 12.5 mg daily. BMET today.  - Continue Farxiga 10 mg daily.  - Continue Coreg 9.375 mg bid (She did not tolerate 12.5 mg bid due to orthostasis).  - Continue losartan 100 mg daily (unable to tolerate even lowest dose Entresto).  - Continue Lasix 40 mg daily. - ECG shows IVCD 148 msec, probably not wide enough to benefit significantly from CRT upgrade.   2. CAD: CABG 1996.  Cath in 5/21 due to fall in EF showed totally occluded LAD, patent RCA and LCx, patent LIMA-LAD and diagonal.  No change, no interventional target.  No chest pain.  - Continue ASA 81 daily.  - Continue atorvastatin 80 daily + Vasceapa, good lipids in 7/23.  3. Chronic Respiratory Failure with hypoxia: Recent admission for multilobar PNA, BCx + pseudomonas aeruginosa. Required BiPap and eventually discharged with home O2. CXR suggestive of chronic emphysema and scarring. Of note, had COVID 2/24. - She has Pulmonary follow up .  Follow up in 4 months with APP.  Anderson Malta [facility] FNP-BC 06/12/2023

## 2023-06-13 ENCOUNTER — Ambulatory Visit (HOSPITAL_COMMUNITY)
Admission: RE | Admit: 2023-06-13 | Discharge: 2023-06-13 | Disposition: A | Discharge status: Home / Self Care | Payer: Medicare Other | Source: Ambulatory Visit | Attending: Family Medicine | Admitting: Family Medicine

## 2023-06-13 ENCOUNTER — Encounter (HOSPITAL_COMMUNITY): Payer: Self-pay

## 2023-06-13 VITALS — BP 100/54 | HR 81 | Wt 125.2 lb

## 2023-06-13 DIAGNOSIS — I110 Hypertensive heart disease with heart failure: Secondary | ICD-10-CM | POA: Insufficient documentation

## 2023-06-13 DIAGNOSIS — R42 Dizziness and giddiness: Secondary | ICD-10-CM | POA: Diagnosis not present

## 2023-06-13 DIAGNOSIS — Z8616 Personal history of COVID-19: Secondary | ICD-10-CM | POA: Insufficient documentation

## 2023-06-13 DIAGNOSIS — I2510 Atherosclerotic heart disease of native coronary artery without angina pectoris: Secondary | ICD-10-CM | POA: Diagnosis not present

## 2023-06-13 DIAGNOSIS — I255 Ischemic cardiomyopathy: Secondary | ICD-10-CM | POA: Insufficient documentation

## 2023-06-13 DIAGNOSIS — Z7982 Long term (current) use of aspirin: Secondary | ICD-10-CM | POA: Diagnosis not present

## 2023-06-13 DIAGNOSIS — I5022 Chronic systolic (congestive) heart failure: Secondary | ICD-10-CM | POA: Diagnosis not present

## 2023-06-13 DIAGNOSIS — J9611 Chronic respiratory failure with hypoxia: Secondary | ICD-10-CM | POA: Diagnosis not present

## 2023-06-13 DIAGNOSIS — M069 Rheumatoid arthritis, unspecified: Secondary | ICD-10-CM | POA: Insufficient documentation

## 2023-06-13 DIAGNOSIS — Z79899 Other long term (current) drug therapy: Secondary | ICD-10-CM | POA: Diagnosis not present

## 2023-06-13 DIAGNOSIS — I447 Left bundle-branch block, unspecified: Secondary | ICD-10-CM | POA: Diagnosis not present

## 2023-06-13 DIAGNOSIS — Z951 Presence of aortocoronary bypass graft: Secondary | ICD-10-CM | POA: Diagnosis not present

## 2023-06-13 DIAGNOSIS — I2582 Chronic total occlusion of coronary artery: Secondary | ICD-10-CM | POA: Diagnosis not present

## 2023-06-13 LAB — BASIC METABOLIC PANEL
Anion gap: 9 (ref 5–15)
BUN: 27 mg/dL — ABNORMAL HIGH (ref 8–23)
CO2: 30 mmol/L (ref 22–32)
Calcium: 9 mg/dL (ref 8.9–10.3)
Chloride: 95 mmol/L — ABNORMAL LOW (ref 98–111)
Creatinine, Ser: 1.06 mg/dL — ABNORMAL HIGH (ref 0.44–1.00)
GFR, Estimated: 55 mL/min — ABNORMAL LOW (ref >60)
Glucose, Bld: 103 mg/dL — ABNORMAL HIGH (ref 70–99)
Potassium: 4.3 mmol/L (ref 3.5–5.1)
Sodium: 134 mmol/L — ABNORMAL LOW (ref 135–145)

## 2023-06-13 LAB — BRAIN NATRIURETIC PEPTIDE: B Natriuretic Peptide: 257.3 pg/mL — ABNORMAL HIGH (ref 0.0–100.0)

## 2023-06-13 LAB — LIPID PANEL
Cholesterol: 86 mg/dL (ref 0–200)
HDL: 27 mg/dL — ABNORMAL LOW (ref >40)
LDL Cholesterol: 36 mg/dL (ref 0–99)
Total CHOL/HDL Ratio: 3.2 RATIO
Triglycerides: 115 mg/dL (ref <150)
VLDL: 23 mg/dL (ref 0–40)

## 2023-06-13 MED ORDER — SPIRONOLACTONE 25 MG PO TABS: 25.0000 mg | ORAL_TABLET | Freq: Every day | ORAL | 3 refills | Status: DC

## 2023-06-13 MED ORDER — LOSARTAN POTASSIUM 50 MG PO TABS: 50.0000 mg | ORAL_TABLET | Freq: Every day | ORAL | 3 refills | Status: DC

## 2023-06-13 NOTE — Patient Instructions (Addendum)
Medication Changes:  INCREASE SPIRONOLACTONE TO 25MG  NIGHTLY  DECREASE LOSARTAN TO 50MG  ONCE DAILY   Lab Work:  Labs done today, your results will be available in MyChart, we will contact you for abnormal readings.  THEN RETURN IN 1 WEEK AS SCHEDULED FOR REPEAT BLOOD WORK   Special Instructions // Education:  # TO [place] O2 [phone] WE WILL ALSO SEND MESSAGE TO DR. MANNAM REGARDING HOME OXYGEN   Follow-Up in: 4 MONTHS WITH DR. Shirlee Latch PLEASE CALL OUR OFFICE AROUND SEPTEMBER TO GET SCHEDULED FOR YOUR APPOINTMENT. PHONE NUMBER IS [phone] OPTION 2    At the [facility], you and your health needs are our priority. We have a designated team specialized in the treatment of Heart Failure. This Care Team includes your primary Heart Failure Specialized Cardiologist (physician), Advanced Practice Providers (APPs- Physician Assistants and Nurse Practitioners), and Pharmacist who all work together to provide you with the care you need, when you need it.   You may see any of the following providers on your designated Care Team at your next follow up:  Dr. Arvilla Meres Dr. Marca Ancona Dr. Marcos Eke, NP Robbie Lis, [place] [facility], [place] Brynda Peon, NP Karle Plumber, PharmD   Please be sure to bring in all your medications bottles to every appointment.   Need to Contact us:  If you have any questions or concerns before your next appointment please send [place] a message through [place] or call our office at [phone].    TO LEAVE A MESSAGE FOR THE NURSE SELECT OPTION 2, PLEASE LEAVE A MESSAGE INCLUDING: YOUR NAME DATE OF BIRTH CALL BACK NUMBER REASON FOR CALL**this is important as we prioritize the call backs  YOU WILL RECEIVE A CALL BACK THE SAME DAY AS LONG AS YOU CALL BEFORE 4:00 PM

## 2023-06-14 ENCOUNTER — Encounter: Payer: Self-pay | Admitting: *Deleted

## 2023-06-14 ENCOUNTER — Telehealth: Payer: Self-pay | Admitting: Pulmonary Disease

## 2023-06-14 DIAGNOSIS — G4733 Obstructive sleep apnea (adult) (pediatric): Secondary | ICD-10-CM | POA: Diagnosis not present

## 2023-06-14 DIAGNOSIS — G4734 Idiopathic sleep related nonobstructive alveolar hypoventilation: Secondary | ICD-10-CM

## 2023-06-14 DIAGNOSIS — M25561 Pain in right knee: Secondary | ICD-10-CM | POA: Diagnosis not present

## 2023-06-14 DIAGNOSIS — M25461 Effusion, right knee: Secondary | ICD-10-CM | POA: Diagnosis not present

## 2023-06-14 DIAGNOSIS — J9611 Chronic respiratory failure with hypoxia: Secondary | ICD-10-CM | POA: Diagnosis not present

## 2023-06-14 DIAGNOSIS — I509 Heart failure, unspecified: Secondary | ICD-10-CM | POA: Diagnosis not present

## 2023-06-14 NOTE — Telephone Encounter (Signed)
Received ONO results from [employer] and placed in Dr. Shirlee More box to be reviewed. DME order was placed with [employer] 06/06/23 for 2L O2 at night.

## 2023-06-15 NOTE — Telephone Encounter (Signed)
DME order placed for 2 liters O2 at night. Called patient and left detailed message on VM (DPR) with Dr. Shirlee More results and recommendations. Cal back number left for questions or concerns.

## 2023-06-15 NOTE — Telephone Encounter (Addendum)
ONO on room air dated 06/11/2023 reviewed. Duration of study 8 hours, 54 minutes Time spent less than 88% - 47 minutes, 42 seconds  Please let patient know that she had significant desaturations or low oxygen level at night.  Agree with DME order for 2 L oxygen at night.

## 2023-06-15 NOTE — Telephone Encounter (Signed)
I have placed ONO on Dr. Shirlee More desk to be reviewed today for a new DME order.  Message routed to Dr. Isaiah Serge

## 2023-06-15 NOTE — Telephone Encounter (Signed)
Patient called stating she was returning a call? Please advise

## 2023-06-20 ENCOUNTER — Ambulatory Visit (HOSPITAL_COMMUNITY)
Admission: RE | Admit: 2023-06-20 | Discharge: 2023-06-20 | Disposition: A | Discharge status: Home / Self Care | Payer: Medicare Other | Source: Ambulatory Visit | Attending: Cardiology | Admitting: Cardiology

## 2023-06-20 DIAGNOSIS — I5022 Chronic systolic (congestive) heart failure: Secondary | ICD-10-CM | POA: Diagnosis not present

## 2023-06-20 LAB — BASIC METABOLIC PANEL
Anion gap: 14 (ref 5–15)
BUN: 30 mg/dL — ABNORMAL HIGH (ref 8–23)
CO2: 27 mmol/L (ref 22–32)
Calcium: 9.4 mg/dL (ref 8.9–10.3)
Chloride: 90 mmol/L — ABNORMAL LOW (ref 98–111)
Creatinine, Ser: 1.25 mg/dL — ABNORMAL HIGH (ref 0.44–1.00)
GFR, Estimated: 45 mL/min — ABNORMAL LOW (ref >60)
Glucose, Bld: 195 mg/dL — ABNORMAL HIGH (ref 70–99)
Potassium: 4.6 mmol/L (ref 3.5–5.1)
Sodium: 131 mmol/L — ABNORMAL LOW (ref 135–145)

## 2023-06-21 DIAGNOSIS — M961 Postlaminectomy syndrome, not elsewhere classified: Secondary | ICD-10-CM | POA: Diagnosis not present

## 2023-06-21 DIAGNOSIS — M47812 Spondylosis without myelopathy or radiculopathy, cervical region: Secondary | ICD-10-CM | POA: Diagnosis not present

## 2023-06-21 DIAGNOSIS — Z9689 Presence of other specified functional implants: Secondary | ICD-10-CM | POA: Diagnosis not present

## 2023-06-22 ENCOUNTER — Other Ambulatory Visit: Payer: Self-pay | Admitting: Gastroenterology

## 2023-06-22 DIAGNOSIS — K219 Gastro-esophageal reflux disease without esophagitis: Secondary | ICD-10-CM

## 2023-06-25 ENCOUNTER — Encounter: Payer: Self-pay | Admitting: Pulmonary Disease

## 2023-06-26 ENCOUNTER — Encounter: Payer: Self-pay | Admitting: Cardiology

## 2023-06-27 DIAGNOSIS — M0579 Rheumatoid arthritis with rheumatoid factor of multiple sites without organ or systems involvement: Secondary | ICD-10-CM | POA: Diagnosis not present

## 2023-06-27 DIAGNOSIS — M1009 Idiopathic gout, multiple sites: Secondary | ICD-10-CM | POA: Diagnosis not present

## 2023-06-27 DIAGNOSIS — R5383 Other fatigue: Secondary | ICD-10-CM | POA: Diagnosis not present

## 2023-06-27 DIAGNOSIS — M1991 Primary osteoarthritis, unspecified site: Secondary | ICD-10-CM | POA: Diagnosis not present

## 2023-06-27 DIAGNOSIS — Z79899 Other long term (current) drug therapy: Secondary | ICD-10-CM | POA: Diagnosis not present

## 2023-06-27 DIAGNOSIS — Z6821 Body mass index (BMI) 21.0-21.9, adult: Secondary | ICD-10-CM | POA: Diagnosis not present

## 2023-06-27 NOTE — Progress Notes (Signed)
[facility] - Clinic Note  07/04/2023   CHIEF COMPLAINT Patient presents for Retina Evaluation  HISTORY OF PRESENT ILLNESS: Katherine Larson is a 77 y.o. female who presents to the clinic today for:  HPI     Retina Evaluation   In both eyes.  Associated Symptoms Blind Spot.  Negative for Flashes, Floaters and Distortion.  Context:  distance vision, mid-range vision, near vision, reading and watching TV.  I, the attending physician,  performed the HPI with the patient and updated documentation appropriately.        Comments   Patient is here today based on a referral from Dr. Zenaida Niece for worsening GA. Patient feels the vision is poor. She is using AT's OU PRN. Her blood sugar was 125.       Last edited by Rennis Chris, MD on 07/04/2023 10:52 AM.    Pt is here on the referral of Dr. 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 Zenaida Niece has been watching it for a year or 2, pt states she has RA and used to take Plaquenil, she does not remember what dosage she was on, but states she took it for 5-10 years, pt takes ocuvite, but does not check her vision on an amsler grid   Referring physician: Diona Foley, MD [address] [place],  [place] [address]  HISTORICAL INFORMATION:  Selected notes from the MEDICAL RECORD NUMBER Referred by Dr. Zenaida Niece for non-exu ARMD OU LEE:  Ocular Hx- PMH-   CURRENT MEDICATIONS: No current outpatient medications on file. (Ophthalmic Drugs)   No current facility-administered medications for this visit. (Ophthalmic Drugs)   Current Outpatient Medications (Other)  Medication Sig   albuterol (PROVENTIL HFA;VENTOLIN HFA) 108 (90 Base) MCG/ACT inhaler Take 2 puffs 3 times a day and every 4 hours as needed.   allopurinol (ZYLOPRIM) 300 MG tablet Take 300 mg by mouth daily.   aspirin EC 81 MG EC tablet Take 1 tablet (81 mg total) by mouth daily.   atorvastatin (LIPITOR) 80 MG tablet Take 80 mg by mouth daily.   carvedilol (COREG) 6.25  MG tablet Patient takes 1.5 tablet by mouth twice daily.   cholecalciferol (VITAMIN D3) 25 MCG (1000 UT) tablet Take 1,000 Units by mouth daily.   dapagliflozin propanediol (FARXIGA) 10 MG TABS tablet TAKE 1 TABLET(10 MG) BY MOUTH DAILY BEFORE BREAKFAST   etanercept (ENBREL SURECLICK) 50 MG/ML injection Inject 50 mg into the skin once a week. For 30 days   ferrous sulfate 325 (65 FE) MG tablet Take 325 mg by mouth daily with breakfast. Every other day   fexofenadine (ALLEGRA) 180 MG tablet Take 180 mg by mouth daily.   furosemide (LASIX) 40 MG tablet Take 40 mg by mouth daily.   gabapentin (NEURONTIN) 300 MG capsule Take 300 mg by mouth 3 (three) times daily.   HYDROcodone-acetaminophen (NORCO) 10-325 MG tablet Take 1 tablet by mouth every 6 (six) hours as needed for moderate pain.   icosapent Ethyl (VASCEPA) 1 g capsule Take 1 capsule (1 g total) by mouth 2 (two) times daily.   lansoprazole (PREVACID) 30 MG capsule TAKE 1 CAPSULE BY MOUTH 1 TO 2 TIMES DAILY BEFORE A MEAL   leflunomide (ARAVA) 20 MG tablet Take 1 tablet (20 mg total) by mouth daily.   linaclotide (LINZESS) 72 MCG capsule Take 1 capsule (72 mcg total) by mouth daily before breakfast. As needed for constipation   losartan (COZAAR) 50 MG tablet Take 1  tablet (50 mg total) by mouth daily.   MAGNESIUM-OXIDE 400 (240 Mg) MG tablet TAKE 1 TABLET(400 MG) BY MOUTH DAILY   meclizine (ANTIVERT) 12.5 MG tablet Take 12.5 mg by mouth 3 (three) times daily as needed for dizziness.   montelukast (SINGULAIR) 10 MG tablet Take 10 mg by mouth at bedtime.   Multiple Vitamins-Minerals (OCUVITE ADULT 50+ PO) Take 1 tablet by mouth daily.   nitroGLYCERIN (NITROSTAT) 0.4 MG SL tablet DISSOLVE 1 TABLET UNDER THE  TONGUE EVERY 5 MINUTES AS NEEDED FOR CHEST PAIN. MAX OF 3 TABLETS IN 15 MINUTES. CALL 911 IF PAIN  PERSISTS.   spironolactone (ALDACTONE) 25 MG tablet Take 1 tablet (25 mg total) by mouth daily.   TRELEGY ELLIPTA 200-62.5-25 MCG/ACT AEPB Inhale  1 puff into the lungs daily.   triamcinolone cream (KENALOG) 0.1 % Apply topically as needed.   No current facility-administered medications for this visit. (Other)   REVIEW OF SYSTEMS: ROS   Positive for: Gastrointestinal, Endocrine, Cardiovascular, Eyes, Respiratory, Allergic/Imm Last edited by Charlette Caffey, COT on 07/04/2023  9:14 AM.     ALLERGIES Allergies  Allergen Reactions   Biaxin [Clarithromycin] Rash and Other (See Comments)    Blisters in mouth    Penicillins Rash and Other (See Comments)    Blisters in mouth Has patient had a PCN reaction causing immediate rash, facial/tongue/throat swelling, SOB or lightheadedness with hypotension: Yesyes Has patient had a PCN reaction causing severe rash involving mucus membranes or skin necrosis: Nono Has patient had a PCN reaction that required hospitalization Nono Has patient had a PCN reaction occurring within the last 10 years: Nono If all of the above answers are "NO", then may proceed   Hydroxychloroquine Other (See Comments)   Sulfamethoxazole     Other Reaction(s): Other (See Comments)   Sulfasalazine Other (See Comments)   Losartan Potassium     Other reaction(s): elevated creatinine   PAST MEDICAL HISTORY Past Medical History:  Diagnosis Date   Anemia    anemia of chronic disease +/- IDA followed by Dr. Shirline Frees. received iron infusions in the past   Asthma    Borderline glaucoma    CAD (coronary artery disease)    a. s/p CABG 1996. b. low risk nuc 2011. c. LHC 04/2016 due to drop in EF -> occluded native LAD,  widely patent sequential LIMA-D1-LAD.   Chronic kidney disease    "mild stage of kidney disease"   Chronic systolic CHF (congestive heart failure) (HCC)    Complication of anesthesia    DM2 (diabetes mellitus, type 2) (HCC)    not on any medicine for this at this time, 05/2016   Dyspnea    with exertion   GERD (gastroesophageal reflux disease)    Gout    History of blood transfusion    History of  hiatal hernia    in 20s   History of pneumonia    March, 2016   HLD (hyperlipidemia)    HTN (hypertension)    Hyperthyroidism 01/21/2016   Inappropriate sinus tachycardia    LBBB (left bundle branch block)    a. 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 Dx 04/2016 - EF 25-30%, diffuse HK, elevated LVEDP, mild MR, mod LAE.   Normocytic anemia 10/17/2021   NSVT (nonsustained ventricular  tachycardia) (HCC)    PAT (paroxysmal atrial tachycardia)    Pneumonia    PONV (postoperative nausea and vomiting)    "after just about every surgery I've had"   Rheumatoid arthritis (HCC)    RA- hands   Seasonal allergies    Past Surgical History:  Procedure Laterality Date   ABDOMINAL HYSTERECTOMY     APPENDECTOMY     BACK SURGERY     BIOPSY  04/27/2020   Procedure: BIOPSY;  Surgeon: Corbin Ade, MD;  Location: [facility];  Service: Endoscopy;;  ascending colon biopsy   CARDIAC CATHETERIZATION N/A 05/01/2016   Procedure: Right/Left Heart Cath and Coronary/Graft Angiography;  Surgeon: Marykay Lex, MD;  Location: [facility] INVASIVE CV LAB;  Service: Cardiovascular;  Laterality: N/A;   CHOLECYSTECTOMY     COLONOSCOPY  11/2011   Dr. Magod:ext/int hemorrhoids, diverticulosis sigmoid colon and distal desc colon, mid-desc colon hyperplastic polyps.    COLONOSCOPY WITH PROPOFOL N/A 04/27/2020   Rourk: Diverticulosis, hemorrhoids, normal terminal ileum.  Random colon biopsies significant for chronic lymphocytic colitis. No repeat due to age.   CORONARY ARTERY BYPASS GRAFT  1996   2 vessels   ESOPHAGOGASTRODUODENOSCOPY  11/2011   Dr. Ewing Schlein: small hiatal hernia, one non-bleeding superficial gastric ulcer, medium-sized diverticulum in area of papilla   ESOPHAGOGASTRODUODENOSCOPY N/A 12/29/2015   Dr. Jena Gauss: Chronic inactive gastritis, normal esophagus  status post dilation, duodenal diverticula   ESOPHAGOGASTRODUODENOSCOPY (EGD) WITH PROPOFOL N/A 07/24/2019   Dr. Jena Gauss: Normal esophagus status post empiric dilation, small hiatal hernia, large D2 diverticulum   ESOPHAGOGASTRODUODENOSCOPY (EGD) WITH PROPOFOL N/A 06/03/2021   Surgeon: Corbin Ade, MD; normal esophagus, normal stomach, normal duodenum.  Abnormal hypopharyngeal mucosa bilaterally just distal to the base of the tongue (right side greater than left), overlying mucosa appeared irregular and somewhat verrucous in appearance.  Recommended ENT evaluation.   EYE SURGERY Bilateral    cataract removal   FOOT SURGERY     removal bone spur   FRACTURE SURGERY Right    arm   GIVENS CAPSULE STUDY  11/2012   Dr. Ewing Schlein: minimal gastritis, normal small bowel capsule   HERNIA REPAIR     hiatal hernia   ICD IMPLANT N/A 02/04/2021   Procedure: ICD IMPLANT;  Surgeon: Marinus Maw, MD;  Location: [facility] INVASIVE CV LAB;  Service: Cardiovascular;  Laterality: N/A;   MALONEY DILATION N/A 07/24/2019   Procedure: Elease Hashimoto DILATION;  Surgeon: Corbin Ade, MD;  Location: [facility];  Service: Endoscopy;  Laterality: N/A;   MALONEY DILATION N/A 06/03/2021   Procedure: Elease Hashimoto DILATION;  Surgeon: Corbin Ade, MD;  Location: [facility];  Service: Endoscopy;  Laterality: N/A;   MAXIMUM ACCESS (MAS)POSTERIOR LUMBAR INTERBODY FUSION (PLIF) 1 LEVEL N/A 08/14/2013   Procedure:  MAXIMUM ACCESS SURGERY(MAS) POSTERIOR LUMBAR INTERBODY FUSION LUMBAR THREE-FOUR ;  Surgeon: Tia Alert, MD;  Location: [facility] NEURO ORS;  Service: Neurosurgery;  Laterality: N/A;   MAXIMUM ACCESS SURGERY(MAS) POSTERIOR LUMBAR INTERBODY FUSION LUMBAR THREE-FOUR    PTCA     RIGHT/LEFT HEART CATH AND CORONARY ANGIOGRAPHY N/A 03/26/2020   Procedure: RIGHT/LEFT HEART CATH AND CORONARY ANGIOGRAPHY;  Surgeon: Marykay Lex, MD;  Location: [facility] INVASIVE CV LAB;  Service: Cardiovascular;  Laterality: N/A;   SPINAL CORD STIMULATOR  BATTERY EXCHANGE N/A 01/19/2021   Procedure: Spinal cord stimulator battery change;  Surgeon: Renaldo Fiddler, MD;  Location: [facility] OR;  Service: Neurosurgery;  Laterality: N/A;   SPINAL CORD STIMULATOR INSERTION N/A  06/16/2016   Procedure: LUMBAR SPINAL CORD STIMULATOR INSERTION;  Surgeon: Odette Fraction, MD;  Location: [facility] NEUR[facility] ORS;  Service: Neurosurgery;  Laterality: N/A;  LUMBAR SPINAL CORD STIMULATOR INSERTION   TEE WITHOUT CARDIOVERSION N/A 02/01/2023   Procedure: TRANSESOPHAGEAL ECHOCARDIOGRAM (TEE);  Surgeon: Jonelle Sidle, MD;  Location: [facility] ORS;  Service: Cardiovascular;  Laterality: N/A;   tens  05/2016   TONSILLECTOMY     FAMILY HISTORY Family History  Problem Relation Age of Onset   Heart attack Father    Heart attack Mother    Bladder Cancer Brother    Cervical cancer Daughter        ?   Colon cancer Neg Hx    SOCIAL HISTORY Social History   Tobacco Use   Smoking status: Former    Current packs/day: 0.00    Average packs/day: 0.8 packs/day for 15.0 years (11.3 ttl pk-yrs)    Types: Cigarettes    Start date: 11/21/1979    Quit date: 11/20/1994    Years since quitting: 28.6   Smokeless tobacco: Never   Tobacco comments:    Quit in 1996  Vaping Use   Vaping status: Never Used  Substance Use Topics   Alcohol use: Never    Alcohol/week: 0.0 standard drinks of alcohol   Drug use: Never       OPHTHALMIC EXAM:  Base Eye Exam     Visual Acuity (Snellen - Linear)       Right Left   Dist [place] 20/80 CF at 1'   Dist ph [place] NI NI         Tonometry (Tonopen, 9:19 AM)       Right Left   Pressure 21 24         Pupils       Dark Light Shape React APD   Right 4 3 Round Brisk None   Left 4 3 Round Brisk None         Visual Fields       Left Right    Full Full         Extraocular Movement       Right Left    Full, Ortho Full, Ortho         Neuro/Psych     Oriented x3: Yes         Dilation     Both eyes: 2.5% Phenylephrine @ 9:19 AM            Slit Lamp and Fundus Exam     Slit Lamp Exam       Right Left   Lids/Lashes Dermatochalasis - upper lid Dermatochalasis - upper lid   Conjunctiva/Sclera White and quiet White and quiet   Cornea 3+ fine Punctate epithelial erosions, well healed cataract wound, EBMD 2-3+ fine Punctate epithelial erosions, well healed cataract wound   Anterior Chamber deep and clear deep and clear   Iris Round and dilated Round and dilated, punctate TID at 0200   Lens 3 piece PC IOL in good position 3 piece PC IOL in good position   Anterior Vitreous mild syneresis mild syneresis         Fundus Exam       Right Left   Disc mild Pallor, Sharp rim trace Pallor, Sharp rim, mild PPA   C/D Ratio 0.3 0.4   Macula Flat, Blunted foveal reflex, central GA with pigment clumping centrally and along borders, No heme or edema Flat, Blunted foveal reflex, central GA with pigment  clumping centrally and along borders, No heme or edema   Vessels attenuated, mild tortuosity attenuated, mild tortuosity   Periphery Attached, reticular degeneration, No heme Attached, reticular degeneration, No heme           IMAGING AND PROCEDURES  Imaging and Procedures for 07/04/2023  OCT, Retina - OU - Both Eyes       Right Eye Quality was good. Central Foveal Thickness: 261. Progression has no prior data. Findings include normal foveal contour, no IRF, no SRF, retinal drusen , outer retinal atrophy, vitreomacular adhesion (Central ORA / ellipsoid loss with some subfoveal sparring).   Left Eye Quality was good. Central Foveal Thickness: 248. Progression has no prior data. Findings include normal foveal contour, no IRF, no SRF, retinal drusen , outer retinal atrophy, vitreomacular adhesion (Central ORA / ellipsoid loss with some subfoveal sparring).   Notes *Images captured and stored on drive  Diagnosis / Impression:  Non-exu ARMD OU Central ORA / ellipsoid loss with some subfoveal sparring OU  Clinical  management:  See below  Abbreviations: NFP - Normal foveal profile. CME - cystoid macular edema. PED - pigment epithelial detachment. IRF - intraretinal fluid. SRF - subretinal fluid. EZ - ellipsoid zone. ERM - epiretinal membrane. ORA - outer retinal atrophy. ORT - outer retinal tubulation. SRHM - subretinal hyper-reflective material. IRHM - intraretinal hyper-reflective material      Fluorescein Angiography Optos (Transit OS)       Right Eye Progression has no prior data. Early phase findings include staining, window defect. Mid/Late phase findings include staining, window defect (Large central window defect corresponding to central GA; no CNV or leakage).   Left Eye Progression has no prior data. Early phase findings include staining, window defect. Mid/Late phase findings include staining, window defect (Large central window defect corresponding to central GA; no CNV or leakage).   Notes **Images stored on drive**  Impression: Large central window defect corresponding to central GA; no CNV or leakage OU           ASSESSMENT/PLAN:   ICD-10-CM   1. 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 Hydroxychloroquine-induced retinopathy of both eyes  H35.89 Fluorescein Angiography Optos (Transit OS)   T37.8X5A     3. Essential hypertension  I10     4. Hypertensive retinopathy of both eyes  H35.033     5. Pseudophakia, both eyes  Z96.1      Age related macular degeneration, non-exudative, both eyes  - advanced stage with central GA on exam and OCT  - BCVA OD 20/80, OS CF1'  - The incidence, anatomy, and pathology of dry AMD, risk of progression, and the AREDS and AREDS 2 study including smoking risks discussed with patient.  - Recommend amsler grid monitoring  - f/u 4-6 months, DFE, OCT  2. h/o Plaquenil (hydroxychloroquine  [HCQ]) use for RA - pattern of GA on OCT (perifoveal ORA w/ some central sparing) consistent with HCQ toxicity - pt reports history of HCQ use for RA -- unknown dose, but duration >5 yrs, ending sometime between 2016 and 2019 - retinal toxicity noted on exam and OCT today - discussed findings, prognosis - no retinal or ophthalmic interventions indicated or recommended  - monitor  3,4. Hypertensive retinopathy OU - discussed importance of tight BP control - monitor   5. Pseudophakia OU  - s/p CE/IOL  OU (Dr. Elmer Picker, ~2009)  - IOLs in good position, doing well  - monitor  Ophthalmic Meds Ordered this visit:  No orders of the defined types were placed in this encounter.    Return for f/u 4-6 months, non-exu ARMD OU, DFE, OCT.  There are no Patient Instructions on file for this visit.  Explained the diagnoses, plan, and follow up with the patient and they expressed understanding.  Patient expressed understanding of the importance of proper follow up care.   This document serves as a record of services personally performed by Karie Chimera, MD, PhD. It was created on their behalf by De Blanch, an ophthalmic technician. The creation of this record is the provider's dictation and/or activities during the visit.    Electronically signed by: De Blanch, OA, 07/04/23  10:59 AM  This document serves as a record of services personally performed by Karie Chimera, MD, PhD. It was created on their behalf by Glee Arvin. Manson Passey, OA an ophthalmic technician. The creation of this record is the provider's dictation and/or activities during the visit.    Electronically signed by: Glee Arvin. Manson Passey, OA 07/04/23 10:59 AM  Karie Chimera, M.D., Ph.D. Diseases & Surgery of the Retina and Vitreous [facility] 07/04/2023  I have reviewed the above documentation for accuracy and completeness, and I agree with the above. Karie Chimera, M.D., Ph.D. 07/04/23 10:59  AM   Abbreviations: M myopia (nearsighted); A astigmatism; H hyperopia (farsighted); P presbyopia; Mrx spectacle prescription;  CTL contact lenses; OD right eye; OS left eye; OU both eyes  XT exotropia; ET esotropia; PEK punctate epithelial keratitis; PEE punctate epithelial erosions; DES dry eye syndrome; MGD meibomian gland dysfunction; ATs artificial tears; PFAT's preservative free artificial tears; NSC nuclear sclerotic cataract; PSC posterior subcapsular cataract; ERM epi-retinal membrane; PVD posterior vitreous detachment; RD retinal detachment; DM diabetes mellitus; DR diabetic retinopathy; NPDR non-proliferative diabetic retinopathy; PDR proliferative diabetic retinopathy; CSME clinically significant macular edema; DME diabetic macular edema; dbh dot blot hemorrhages; CWS cotton wool spot; POAG primary open angle glaucoma; C/D cup-to-disc ratio; HVF humphrey visual field; GVF goldmann visual field; OCT optical coherence tomography; IOP intraocular pressure; BRVO Branch retinal vein occlusion; CRVO central retinal vein occlusion; CRAO central retinal artery occlusion; BRAO branch retinal artery occlusion; RT retinal tear; SB scleral buckle; PPV pars plana vitrectomy; VH Vitreous hemorrhage; PRP panretinal laser photocoagulation; IVK intravitreal kenalog; VMT vitreomacular traction; MH Macular hole;  NVD neovascularization of the disc; NVE neovascularization elsewhere; AREDS age related eye disease study; ARMD age related macular degeneration; POAG primary open angle glaucoma; EBMD epithelial/anterior basement membrane dystrophy; ACIOL anterior chamber intraocular lens; IOL intraocular lens; PCIOL posterior chamber intraocular lens; Phaco/IOL phacoemulsification with intraocular lens placement; PRK photorefractive keratectomy; LASIK laser assisted in situ keratomileusis; HTN hypertension; DM diabetes mellitus; COPD chronic obstructive pulmonary disease

## 2023-06-28 ENCOUNTER — Other Ambulatory Visit: Payer: Medicare Other

## 2023-07-04 ENCOUNTER — Encounter (INDEPENDENT_AMBULATORY_CARE_PROVIDER_SITE_OTHER): Payer: Self-pay | Admitting: Ophthalmology

## 2023-07-04 ENCOUNTER — Ambulatory Visit (INDEPENDENT_AMBULATORY_CARE_PROVIDER_SITE_OTHER): Payer: Medicare Other | Admitting: Ophthalmology

## 2023-07-04 DIAGNOSIS — H35033 Hypertensive retinopathy, bilateral: Secondary | ICD-10-CM | POA: Diagnosis not present

## 2023-07-04 DIAGNOSIS — H353134 Nonexudative age-related macular degeneration, bilateral, advanced atrophic with subfoveal involvement: Secondary | ICD-10-CM

## 2023-07-04 DIAGNOSIS — H3581 Retinal edema: Secondary | ICD-10-CM

## 2023-07-04 DIAGNOSIS — I10 Essential (primary) hypertension: Secondary | ICD-10-CM

## 2023-07-04 DIAGNOSIS — Z961 Presence of intraocular lens: Secondary | ICD-10-CM

## 2023-07-04 DIAGNOSIS — H3589 Other specified retinal disorders: Secondary | ICD-10-CM

## 2023-07-05 ENCOUNTER — Encounter: Payer: Medicare Other | Admitting: Internal Medicine

## 2023-07-12 ENCOUNTER — Inpatient Hospital Stay: Payer: Medicare Other | Attending: Hematology | Admitting: Oncology

## 2023-07-12 DIAGNOSIS — M1711 Unilateral primary osteoarthritis, right knee: Secondary | ICD-10-CM | POA: Diagnosis not present

## 2023-07-12 DIAGNOSIS — D649 Anemia, unspecified: Secondary | ICD-10-CM

## 2023-07-12 DIAGNOSIS — N1831 Chronic kidney disease, stage 3a: Secondary | ICD-10-CM | POA: Insufficient documentation

## 2023-07-12 DIAGNOSIS — D631 Anemia in chronic kidney disease: Secondary | ICD-10-CM | POA: Diagnosis not present

## 2023-07-12 LAB — COMPREHENSIVE METABOLIC PANEL
ALT: 29 U/L (ref 0–44)
AST: 22 U/L (ref 15–41)
Albumin: 3.7 g/dL (ref 3.5–5.0)
Alkaline Phosphatase: 74 U/L (ref 38–126)
Anion gap: 13 (ref 5–15)
BUN: 33 mg/dL — ABNORMAL HIGH (ref 8–23)
CO2: 25 mmol/L (ref 22–32)
Calcium: 8.9 mg/dL (ref 8.9–10.3)
Chloride: 98 mmol/L (ref 98–111)
Creatinine, Ser: 1.04 mg/dL — ABNORMAL HIGH (ref 0.44–1.00)
GFR, Estimated: 56 mL/min — ABNORMAL LOW (ref >60)
Glucose, Bld: 101 mg/dL — ABNORMAL HIGH (ref 70–99)
Potassium: 4.2 mmol/L (ref 3.5–5.1)
Sodium: 136 mmol/L (ref 135–145)
Total Bilirubin: 0.6 mg/dL (ref 0.3–1.2)
Total Protein: 7 g/dL (ref 6.5–8.1)

## 2023-07-12 LAB — CBC WITH DIFFERENTIAL/PLATELET
Abs Immature Granulocytes: 0.04 10*3/uL (ref 0.00–0.07)
Basophils Absolute: 0 10*3/uL (ref 0.0–0.1)
Basophils Relative: 0 %
Eosinophils Absolute: 0.2 10*3/uL (ref 0.0–0.5)
Eosinophils Relative: 2 %
HCT: 32.7 % — ABNORMAL LOW (ref 36.0–46.0)
Hemoglobin: 10.3 g/dL — ABNORMAL LOW (ref 12.0–15.0)
Immature Granulocytes: 1 %
Lymphocytes Relative: 31 %
Lymphs Abs: 2.6 10*3/uL (ref 0.7–4.0)
MCH: 32.9 pg (ref 26.0–34.0)
MCHC: 31.5 g/dL (ref 30.0–36.0)
MCV: 104.5 fL — ABNORMAL HIGH (ref 80.0–100.0)
Monocytes Absolute: 0.7 10*3/uL (ref 0.1–1.0)
Monocytes Relative: 9 %
Neutro Abs: 4.9 10*3/uL (ref 1.7–7.7)
Neutrophils Relative %: 57 %
Platelets: 150 10*3/uL (ref 150–400)
RBC: 3.13 MIL/uL — ABNORMAL LOW (ref 3.87–5.11)
RDW: 15.9 % — ABNORMAL HIGH (ref 11.5–15.5)
WBC: 8.4 10*3/uL (ref 4.0–10.5)
nRBC: 0 % (ref 0.0–0.2)

## 2023-07-12 LAB — IRON AND TIBC
Iron: 37 ug/dL (ref 28–170)
Saturation Ratios: 9 % — ABNORMAL LOW (ref 10.4–31.8)
TIBC: 399 ug/dL (ref 250–450)
UIBC: 362 ug/dL

## 2023-07-12 LAB — FERRITIN: Ferritin: 184 ng/mL (ref 11–307)

## 2023-07-15 DIAGNOSIS — I509 Heart failure, unspecified: Secondary | ICD-10-CM | POA: Diagnosis not present

## 2023-07-15 DIAGNOSIS — J9611 Chronic respiratory failure with hypoxia: Secondary | ICD-10-CM | POA: Diagnosis not present

## 2023-07-15 DIAGNOSIS — G4733 Obstructive sleep apnea (adult) (pediatric): Secondary | ICD-10-CM | POA: Diagnosis not present

## 2023-07-17 ENCOUNTER — Encounter (HOSPITAL_COMMUNITY): Payer: Self-pay | Admitting: Hematology

## 2023-07-20 ENCOUNTER — Inpatient Hospital Stay: Payer: Medicare Other | Admitting: Oncology

## 2023-07-20 ENCOUNTER — Telehealth: Payer: Medicare Other | Admitting: Physician Assistant

## 2023-07-20 DIAGNOSIS — N1831 Chronic kidney disease, stage 3a: Secondary | ICD-10-CM | POA: Diagnosis not present

## 2023-07-20 DIAGNOSIS — D631 Anemia in chronic kidney disease: Secondary | ICD-10-CM | POA: Diagnosis not present

## 2023-07-20 DIAGNOSIS — D649 Anemia, unspecified: Secondary | ICD-10-CM | POA: Diagnosis not present

## 2023-07-20 DIAGNOSIS — D72828 Other elevated white blood cell count: Secondary | ICD-10-CM | POA: Diagnosis not present

## 2023-07-20 NOTE — Progress Notes (Signed)
Virtual Visit via Telephone Note  I connected with Katherine Larson on 07/20/23 at  1:30 PM EDT by telephone and verified that I am speaking with the correct person using two identifiers.  Location: Patient: Home Provider: Clinic    I discussed the limitations, risks, security and privacy concerns of performing an evaluation and management service by telephone and the availability of in person appointments. I also discussed with the patient that there may be a patient responsible charge related to this service. The patient expressed understanding and agreed to proceed.  History of Present Illness: Mrs. Reefer is a 77 year old female with past medical history significant for hypertension, lymphocytic colitis, CAD, COPD, GERD, type 2 diabetes, hyperlipidemia and iron deficiency anemia who was last seen in clinic in June 2023.  Since her last visit, she was ill with Covid, required hospitalization in 01/2023 for acute respiratory failure with hypoxia, sepsis with PNA and pseudomonas bacteremia. She continues to have oxygen requirements at home. Still continues to recover.   She denies any bleeding per rectum or melena.  Has had both an EGD and colonoscopy back in June/July 2022.  No active leading source.  Had internal hemorrhoids and diverticulosis.  She has received intermittent IV iron over the past few years last given on 10/21/2021 and 10/28/2021.  She was instructed to stop taking oral iron secondary to elevated ferritin levels.  She has not been on oral iron in several years.  Today, she continues to feel tired but feels like she is recovering from pneumonia secondary to COVID.  Her husband is still very weak and she is having to care for him.  Appetite is 80% energy levels are 50%.  Pain is 6 out of 10 and mainly in her back.  She has shortness of breath secondary to COPD and wears 2 L of oxygen at nighttime.    Observations/Objective: Review of Systems  Constitutional:  Positive for  malaise/fatigue.  Respiratory:  Positive for shortness of breath.   Musculoskeletal:  Positive for back pain.   Physical Exam Neurological:     Mental Status: She is alert and oriented to person, place, and time.    Assessment and Plan: 1. Normocytic anemia -Lab work from 07/12/2023 shows a hemoglobin of 10.3, MCV 104.5 and normal platelet count.  Differential is normal.  Ferritin is 185, iron saturation 9%. -Patient would benefit from 1 additional doses of IV Feraheme.  In the setting of chronic kidney disease iron saturation should be closer to 30%.  -Return to clinic 8 to 12 weeks after iron to recheck labs.  2. Anemia in stage 3a chronic kidney disease (HCC) -Hemoglobin from 07/12/2023 was 10.3.  Baseline hemoglobin is between 9 and 11.  Most recent creatinine is 1.04 with a BUN of 33.  She follows with nephrology. -No indication for Epogen at this time.  3. Other elevated white blood cell (WBC) count -Most recent labs from 07/12/2023 showed normal white blood cell count.  Differential is also normal.  Continue to monitor.   Follow Up Instructions: Recommend 1 dose of IV Feraheme over the next couple of weeks.  Return to clinic in 8 to 12 weeks for follow-up with labs (cbc/d, ferritin, iron panel) and in person visit.   I discussed the assessment and treatment plan with the patient. The patient was provided an opportunity to ask questions and all were answered. The patient agreed with the plan and demonstrated an understanding of the instructions.   The patient was advised to  call back or seek an in-person evaluation if the symptoms worsen or if the condition fails to improve as anticipated.  I provided 20 minutes of non-face-to-face time during this encounter.   Mauro Kaufmann, NP

## 2023-07-27 DIAGNOSIS — J441 Chronic obstructive pulmonary disease with (acute) exacerbation: Secondary | ICD-10-CM | POA: Diagnosis not present

## 2023-07-27 DIAGNOSIS — Z03818 Encounter for observation for suspected exposure to other biological agents ruled out: Secondary | ICD-10-CM | POA: Diagnosis not present

## 2023-07-27 DIAGNOSIS — J069 Acute upper respiratory infection, unspecified: Secondary | ICD-10-CM | POA: Diagnosis not present

## 2023-07-27 DIAGNOSIS — D84821 Immunodeficiency due to drugs: Secondary | ICD-10-CM | POA: Diagnosis not present

## 2023-08-03 ENCOUNTER — Ambulatory Visit (INDEPENDENT_AMBULATORY_CARE_PROVIDER_SITE_OTHER): Payer: Medicare Other

## 2023-08-03 ENCOUNTER — Inpatient Hospital Stay: Payer: Medicare Other | Attending: Physician Assistant

## 2023-08-03 VITALS — BP 122/63 | HR 74 | Temp 96.7°F | Resp 20

## 2023-08-03 DIAGNOSIS — D631 Anemia in chronic kidney disease: Secondary | ICD-10-CM | POA: Diagnosis not present

## 2023-08-03 DIAGNOSIS — N1831 Chronic kidney disease, stage 3a: Secondary | ICD-10-CM | POA: Diagnosis not present

## 2023-08-03 DIAGNOSIS — I255 Ischemic cardiomyopathy: Secondary | ICD-10-CM | POA: Diagnosis not present

## 2023-08-03 DIAGNOSIS — D649 Anemia, unspecified: Secondary | ICD-10-CM

## 2023-08-03 LAB — CUP PACEART REMOTE DEVICE CHECK
Battery Remaining Longevity: 95 mo
Battery Remaining Percentage: 78 %
Battery Voltage: 2.99 V
Brady Statistic RV Percent Paced: 1 %
Date Time Interrogation Session: 20240913020845
HighPow Impedance: 63 Ohm
Implantable Lead Connection Status: 753985
Implantable Lead Implant Date: 20220318
Implantable Lead Location: 753860
Implantable Pulse Generator Implant Date: 20220318
Lead Channel Impedance Value: 480 Ohm
Lead Channel Pacing Threshold Amplitude: 0.75 V
Lead Channel Pacing Threshold Pulse Width: 0.5 ms
Lead Channel Sensing Intrinsic Amplitude: 12 mV
Lead Channel Setting Pacing Amplitude: 2.5 V
Lead Channel Setting Pacing Pulse Width: 0.5 ms
Lead Channel Setting Sensing Sensitivity: 0.5 mV
Pulse Gen Serial Number: 810021948
Zone Setting Status: 755011

## 2023-08-03 MED ORDER — LORATADINE 10 MG PO TABS: 10.0000 mg | ORAL_TABLET | Freq: Once | ORAL | Status: DC

## 2023-08-03 MED ORDER — SODIUM CHLORIDE 0.9 % IV SOLN: Freq: Once | INTRAVENOUS | Status: AC

## 2023-08-03 MED ORDER — SODIUM CHLORIDE 0.9 % IV SOLN
510.0000 mg | Freq: Once | INTRAVENOUS | Status: AC
  Administered 2023-08-03: 510 mg via INTRAVENOUS
  Filled 2023-08-03: qty 510

## 2023-08-03 MED ORDER — CETIRIZINE HCL 10 MG PO TABS
10.0000 mg | ORAL_TABLET | Freq: Once | ORAL | Status: AC
  Administered 2023-08-03: 10 mg via ORAL
  Filled 2023-08-03: qty 1

## 2023-08-03 MED ORDER — ACETAMINOPHEN 325 MG PO TABS
650.0000 mg | ORAL_TABLET | Freq: Once | ORAL | Status: AC
  Administered 2023-08-03: 650 mg via ORAL
  Filled 2023-08-03: qty 2

## 2023-08-03 NOTE — Progress Notes (Signed)
Patient presents today for Feraheme infusion. Last Feraheme infusion in 2022. Patient denies any side effects related to iron infusion in the past. Vital signs stable. Patient has no complaints today.

## 2023-08-03 NOTE — Patient Instructions (Signed)
[facility]  Discharge Instructions: Thank you for choosing [facility] to provide your oncology and hematology care.  If you have a lab appointment with the Cancer Center - please note that after April 8th, 2024, all labs will be drawn in the cancer center.  You do not have to check in or register with the main entrance as you have in the past but will complete your check-in in the cancer center.  Wear comfortable clothing and clothing appropriate for easy access to any Portacath or PICC line.   We strive to give you quality time with your provider. You may need to reschedule your appointment if you arrive late (15 or more minutes).  Arriving late affects you and other patients whose appointments are after yours.  Also, if you miss three or more appointments without notifying the office, you may be dismissed from the clinic at the provider's discretion.      For prescription refill requests, have your pharmacy contact our office and allow 72 hours for refills to be completed.    Today you received the following chemotherapy and/or immunotherapy agents Feraheme.  Ferumoxytol Injection What is this medication? FERUMOXYTOL (FER ue MOX i tol) treats low levels of iron in your body (iron deficiency anemia). Iron is a mineral that plays an important role in making red blood cells, which carry oxygen from your lungs to the rest of your body. This medicine may be used for other purposes; ask your health care provider or pharmacist if you have questions. COMMON BRAND NAME(S): Feraheme What should I tell my care team before I take this medication? They need to know if you have any of these conditions: Anemia not caused by low iron levels High levels of iron in the blood Magnetic resonance imaging (MRI) test scheduled An unusual or allergic reaction to iron, other medications, foods, dyes, or preservatives Pregnant or trying to get pregnant Breastfeeding How should  I use this medication? This medication is injected into a vein. It is given by your care team in a hospital or clinic setting. Talk to your care team the use of this medication in children. Special care may be needed. Overdosage: If you think you have taken too much of this medicine contact a poison control center or emergency room at once. NOTE: This medicine is only for you. Do not share this medicine with others. What if I miss a dose? It is important not to miss your dose. Call your care team if you are unable to keep an appointment. What may interact with this medication? Other iron products This list may not describe all possible interactions. Give your health care provider a list of all the medicines, herbs, non-prescription drugs, or dietary supplements you use. Also tell them if you smoke, drink alcohol, or use illegal drugs. Some items may interact with your medicine. What should I watch for while using this medication? Visit your care team regularly. Tell your care team if your symptoms do not start to get better or if they get worse. You may need blood work done while you are taking this medication. You may need to follow a special diet. Talk to your care team. Foods that contain iron include: whole grains/cereals, dried fruits, beans, or peas, leafy green vegetables, and organ meats (liver, kidney). What side effects may I notice from receiving this medication? Side effects that you should report to your care team as soon as possible: Allergic reactions--skin rash, itching, hives,  swelling of the face, lips, tongue, or throat Low blood pressure--dizziness, feeling faint or lightheaded, blurry vision Shortness of breath Side effects that usually do not require medical attention (report to your care team if they continue or are bothersome): Flushing Headache Joint pain Muscle pain Nausea Pain, redness, or irritation at injection site This list may not describe all possible side  effects. Call your doctor for medical advice about side effects. You may report side effects to FDA at 1-800-FDA-1088. Where should I keep my medication? This medication is given in a hospital or clinic. It will not be stored at home. NOTE: This sheet is a summary. It may not cover all possible information. If you have questions about this medicine, talk to your doctor, pharmacist, or health care provider.  2024 Elsevier/Gold Standard (2023-04-13 00:00:00)       To help prevent nausea and vomiting after your treatment, we encourage you to take your nausea medication as directed.  BELOW ARE SYMPTOMS THAT SHOULD BE REPORTED IMMEDIATELY: *FEVER GREATER THAN 100.4 F (38 C) OR HIGHER *CHILLS OR SWEATING *NAUSEA AND VOMITING THAT IS NOT CONTROLLED WITH YOUR NAUSEA MEDICATION *UNUSUAL SHORTNESS OF BREATH *UNUSUAL BRUISING OR BLEEDING *URINARY PROBLEMS (pain or burning when urinating, or frequent urination) *BOWEL PROBLEMS (unusual diarrhea, constipation, pain near the anus) TENDERNESS IN MOUTH AND THROAT WITH OR WITHOUT PRESENCE OF ULCERS (sore throat, sores in mouth, or a toothache) UNUSUAL RASH, SWELLING OR PAIN  UNUSUAL VAGINAL DISCHARGE OR ITCHING   Items with * indicate a potential emergency and should be followed up as soon as possible or go to the Emergency Department if any problems should occur.  Please show the CHEMOTHERAPY ALERT CARD or IMMUNOTHERAPY ALERT CARD at check-in to the Emergency Department and triage nurse.  Should you have questions after your visit or need to cancel or reschedule your appointment, please contact [facility] [phone]  and follow the prompts.  Office hours are 8:00 a.m. to 4:30 p.m. Monday - Friday. Please note that voicemails left after 4:00 p.m. may not be returned until the following business day.  We are closed weekends and major holidays. You have access to a nurse at all times for urgent questions. Please call the main number  to the clinic [phone] and follow the prompts.  For any non-urgent questions, you may also contact your provider using MyChart. We now offer e-Visits for anyone 73 and older to request care online for non-urgent symptoms. For details visit [email or web address].   Also download the MyChart app! Go to the app store, search "MyChart", open the app, select [place], and log in with your MyChart username and password.

## 2023-08-03 NOTE — Progress Notes (Signed)
Patient tolerated treatment well with no complaints voiced.  Patient left ambulatory in stable condition.  Vital signs stable at discharge.  Follow up as scheduled.

## 2023-08-06 DIAGNOSIS — D509 Iron deficiency anemia, unspecified: Secondary | ICD-10-CM | POA: Diagnosis not present

## 2023-08-06 DIAGNOSIS — N1831 Chronic kidney disease, stage 3a: Secondary | ICD-10-CM | POA: Diagnosis not present

## 2023-08-06 DIAGNOSIS — N189 Chronic kidney disease, unspecified: Secondary | ICD-10-CM | POA: Diagnosis not present

## 2023-08-06 DIAGNOSIS — N390 Urinary tract infection, site not specified: Secondary | ICD-10-CM | POA: Diagnosis not present

## 2023-08-06 DIAGNOSIS — I255 Ischemic cardiomyopathy: Secondary | ICD-10-CM | POA: Diagnosis not present

## 2023-08-06 DIAGNOSIS — M069 Rheumatoid arthritis, unspecified: Secondary | ICD-10-CM | POA: Diagnosis not present

## 2023-08-08 NOTE — Progress Notes (Signed)
Remote ICD transmission.   

## 2023-08-30 ENCOUNTER — Encounter: Payer: Self-pay | Admitting: Internal Medicine

## 2023-08-30 ENCOUNTER — Ambulatory Visit: Payer: Medicare Other | Attending: Internal Medicine | Admitting: Internal Medicine

## 2023-08-30 VITALS — BP 138/64 | HR 78 | Ht 62.5 in | Wt 132.0 lb

## 2023-08-30 DIAGNOSIS — I5022 Chronic systolic (congestive) heart failure: Secondary | ICD-10-CM | POA: Diagnosis not present

## 2023-08-30 LAB — CUP PACEART INCLINIC DEVICE CHECK
Battery Remaining Longevity: 96 mo
Brady Statistic RV Percent Paced: 0 %
Date Time Interrogation Session: 20241010134631
HighPow Impedance: 59.625
Implantable Lead Connection Status: 753985
Implantable Lead Implant Date: 20220318
Implantable Lead Location: 753860
Implantable Pulse Generator Implant Date: 20220318
Lead Channel Impedance Value: 400 Ohm
Lead Channel Pacing Threshold Amplitude: 0.75 V
Lead Channel Pacing Threshold Amplitude: 0.75 V
Lead Channel Pacing Threshold Pulse Width: 0.5 ms
Lead Channel Pacing Threshold Pulse Width: 0.5 ms
Lead Channel Sensing Intrinsic Amplitude: 12 mV
Lead Channel Setting Pacing Amplitude: 2.5 V
Lead Channel Setting Pacing Pulse Width: 0.5 ms
Lead Channel Setting Sensing Sensitivity: 0.5 mV
Pulse Gen Serial Number: 810021948
Zone Setting Status: 755011

## 2023-08-30 NOTE — Progress Notes (Signed)
HPI Katherine Larson returns today for followup. She is a pleasant 77 yo woman with a h/o HFrEF, on maximal medical therapy, who I saw almost 4 years ago for possible ICD insertion. Her EF has improved and we held off. She developed worsening dyspnea about a year ago and her meds have been adjusted but her EF remains down. She had syncope 2 weeks ago. No arrhythmias seen. She was weak after the episode.     Allergies  Allergen Reactions   Biaxin [Clarithromycin] Rash and Other (See Comments)    Blisters in mouth    Penicillins Rash and Other (See Comments)    Blisters in mouth Has patient had a PCN reaction causing immediate rash, facial/tongue/throat swelling, SOB or lightheadedness with hypotension: Yesyes Has patient had a PCN reaction causing severe rash involving mucus membranes or skin necrosis: Nono Has patient had a PCN reaction that required hospitalization Nono Has patient had a PCN reaction occurring within the last 10 years: Nono If all of the above answers are "NO", then may proceed   Hydroxychloroquine Other (See Comments)   Sulfamethoxazole     Other Reaction(s): Other (See Comments)   Sulfasalazine Other (See Comments)   Losartan Potassium     Other reaction(s): elevated creatinine     Current Outpatient Medications  Medication Sig Dispense Refill   albuterol (PROVENTIL HFA;VENTOLIN HFA) 108 (90 Base) MCG/ACT inhaler Take 2 puffs 3 times a day and every 4 hours as needed.     allopurinol (ZYLOPRIM) 300 MG tablet Take 300 mg by mouth daily.     aspirin EC 81 MG EC tablet Take 1 tablet (81 mg total) by mouth daily.     atorvastatin (LIPITOR) 80 MG tablet Take 80 mg by mouth daily.     carvedilol (COREG) 6.25 MG tablet Patient takes 1.5 tablet by mouth twice daily.     cholecalciferol (VITAMIN D3) 25 MCG (1000 UT) tablet Take 1,000 Units by mouth daily.     dapagliflozin propanediol (FARXIGA) 10 MG TABS tablet TAKE 1 TABLET(10 MG) BY MOUTH DAILY BEFORE BREAKFAST 30  tablet 10   etanercept (ENBREL SURECLICK) 50 MG/ML injection Inject 50 mg into the skin once a week. For 30 days     ferrous sulfate 325 (65 FE) MG tablet Take 325 mg by mouth daily with breakfast. Every other day     fexofenadine (ALLEGRA) 180 MG tablet Take 180 mg by mouth daily.     fluticasone (FLONASE) 50 MCG/ACT nasal spray Place 1 spray into both nostrils 2 (two) times daily.     furosemide (LASIX) 40 MG tablet Take 40 mg by mouth daily.     gabapentin (NEURONTIN) 300 MG capsule Take 300 mg by mouth 3 (three) times daily.     HYDROcodone-acetaminophen (NORCO) 10-325 MG tablet Take 1 tablet by mouth every 6 (six) hours as needed for moderate pain.     icosapent Ethyl (VASCEPA) 1 g capsule Take 1 capsule (1 g total) by mouth 2 (two) times daily. 120 capsule 10   lansoprazole (PREVACID) 30 MG capsule TAKE 1 CAPSULE BY MOUTH 1 TO 2 TIMES DAILY BEFORE A MEAL 60 capsule 5   leflunomide (ARAVA) 20 MG tablet Take 1 tablet (20 mg total) by mouth daily.     linaclotide (LINZESS) 72 MCG capsule Take 1 capsule (72 mcg total) by mouth daily before breakfast. As needed for constipation 30 capsule 5   losartan (COZAAR) 50 MG tablet Take 1 tablet (50 mg  total) by mouth daily. (Patient taking differently: Take 50 mg by mouth daily.) 90 tablet 3   MAGNESIUM-OXIDE 400 (240 Mg) MG tablet TAKE 1 TABLET(400 MG) BY MOUTH DAILY 30 tablet 2   meclizine (ANTIVERT) 12.5 MG tablet Take 12.5 mg by mouth 3 (three) times daily as needed for dizziness.     montelukast (SINGULAIR) 10 MG tablet Take 10 mg by mouth at bedtime.     Multiple Vitamins-Minerals (OCUVITE ADULT 50+ PO) Take 1 tablet by mouth daily.     nitroGLYCERIN (NITROSTAT) 0.4 MG SL tablet DISSOLVE 1 TABLET UNDER THE  TONGUE EVERY 5 MINUTES AS NEEDED FOR CHEST PAIN. MAX OF 3 TABLETS IN 15 MINUTES. CALL 911 IF PAIN  PERSISTS. 75 tablet 4   spironolactone (ALDACTONE) 25 MG tablet Take 1 tablet (25 mg total) by mouth daily. 90 tablet 3   TRELEGY ELLIPTA  200-62.5-25 MCG/ACT AEPB Inhale 1 puff into the lungs daily.     triamcinolone cream (KENALOG) 0.1 % Apply topically as needed.     No current facility-administered medications for this visit.     Past Medical History:  Diagnosis Date   Anemia    anemia of chronic disease +/- IDA followed by Dr. Shirline Frees. received iron infusions in the past   Asthma    Borderline glaucoma    CAD (coronary artery disease)    a. s/p CABG 1996. b. low risk nuc 2011. c. LHC 04/2016 due to drop in EF -> occluded native LAD,  widely patent sequential LIMA-D1-LAD.   Chronic kidney disease    "mild stage of kidney disease"   Chronic systolic CHF (congestive heart failure) (HCC)    Complication of anesthesia    DM2 (diabetes mellitus, type 2) (HCC)    not on any medicine for this at this time, 05/2016   Dyspnea    with exertion   GERD (gastroesophageal reflux disease)    Gout    History of blood transfusion    History of hiatal hernia    in 20s   History of pneumonia    March, 2016   HLD (hyperlipidemia)    HTN (hypertension)    Hyperthyroidism 01/21/2016   Inappropriate sinus tachycardia (HCC)    LBBB (left bundle branch block)    a. Seen in 04/2016   Lymphocytic colitis 04/2020   Macular degeneration    left   Myocardial infarction [facility])    1996   Neuropathy    Neuropathy    NICM (nonischemic cardiomyopathy) (HCC)    a. Dx 04/2016 - EF 25-30%, diffuse HK, elevated LVEDP, mild MR, mod LAE.   Normocytic anemia 10/17/2021   NSVT (nonsustained ventricular tachycardia) (HCC)    PAT (paroxysmal atrial tachycardia) (HCC)    Pneumonia    PONV (postoperative nausea and vomiting)    "after just about every surgery I've had"   Rheumatoid arthritis (HCC)    RA- hands   Seasonal allergies     ROS:   All systems reviewed and negative except as noted in the HPI.   Past Surgical History:  Procedure Laterality Date   ABDOMINAL HYSTERECTOMY     APPENDECTOMY     BACK SURGERY     BIOPSY  04/27/2020    Procedure: BIOPSY;  Surgeon: Corbin Ade, MD;  [facility]: [facility];  Service: Endoscopy;;  ascending colon biopsy   CARDIAC CATHETERIZATION N/A 05/01/2016   Procedure: Right/Left Heart Cath and Coronary/Graft Angiography;  Surgeon: Marykay Lex, MD;  [facility] INVASIVE CV  LAB;  Service: Cardiovascular;  Laterality: N/A;   CHOLECYSTECTOMY     COLONOSCOPY  11/2011   Dr. Magod:ext/int hemorrhoids, diverticulosis sigmoid colon and distal desc colon, mid-desc colon hyperplastic polyps.    COLONOSCOPY WITH PROPOFOL N/A 04/27/2020   Rourk: Diverticulosis, hemorrhoids, normal terminal ileum.  Random colon biopsies significant for chronic lymphocytic colitis. No repeat due to age.   CORONARY ARTERY BYPASS GRAFT  1996   2 vessels   ESOPHAGOGASTRODUODENOSCOPY  11/2011   Dr. Ewing Schlein: small hiatal hernia, one non-bleeding superficial gastric ulcer, medium-sized diverticulum in area of papilla   ESOPHAGOGASTRODUODENOSCOPY N/A 12/29/2015   Dr. Jena Gauss: Chronic inactive gastritis, normal esophagus status post dilation, duodenal diverticula   ESOPHAGOGASTRODUODENOSCOPY (EGD) WITH PROPOFOL N/A 07/24/2019   Dr. Jena Gauss: Normal esophagus status post empiric dilation, small hiatal hernia, large D2 diverticulum   ESOPHAGOGASTRODUODENOSCOPY (EGD) WITH PROPOFOL N/A 06/03/2021   Surgeon: Corbin Ade, MD; normal esophagus, normal stomach, normal duodenum.  Abnormal hypopharyngeal mucosa bilaterally just distal to the base of the tongue (right side greater than left), overlying mucosa appeared irregular and somewhat verrucous in appearance.  Recommended ENT evaluation.   EYE SURGERY Bilateral    cataract removal   FOOT SURGERY     removal bone spur   FRACTURE SURGERY Right    arm   GIVENS CAPSULE STUDY  11/2012   Dr. Ewing Schlein: minimal gastritis, normal small bowel capsule   HERNIA REPAIR     hiatal hernia   ICD IMPLANT N/A 02/04/2021   Procedure: ICD IMPLANT;  Surgeon: Marinus Maw, MD;  Location:  [facility] INVASIVE CV LAB;  Service: Cardiovascular;  Laterality: N/A;   MALONEY DILATION N/A 07/24/2019   Procedure: Elease Hashimoto DILATION;  Surgeon: Corbin Ade, MD;  [facility]: [facility];  Service: Endoscopy;  Laterality: N/A;   MALONEY DILATION N/A 06/03/2021   Procedure: Elease Hashimoto DILATION;  Surgeon: Corbin Ade, MD;  Location: [facility];  Service: Endoscopy;  Laterality: N/A;   MAXIMUM ACCESS (MAS)POSTERIOR LUMBAR INTERBODY FUSION (PLIF) 1 LEVEL N/A 08/14/2013   Procedure:  MAXIMUM ACCESS SURGERY(MAS) POSTERIOR LUMBAR INTERBODY FUSION LUMBAR THREE-FOUR ;  Surgeon: Tia Alert, MD;  Location: [facility]O ORS;  Service: Neurosurgery;  Laterality: N/A;   MAXIMUM ACCESS SURGERY(MAS) POSTERIOR LUMBAR INTERBODY FUSION LUMBAR THREE-FOUR    PTCA     RIGHT/LEFT HEART CATH AND CORONARY ANGIOGRAPHY N/A 03/26/2020   Procedure: RIGHT/LEFT HEART CATH AND CORONARY ANGIOGRAPHY;  Surgeon: Marykay Lex, MD;  Location: [facility] INVASIVE CV LAB;  Service: Cardiovascular;  Laterality: N/A;   SPINAL CORD STIMULATOR BATTERY EXCHANGE N/A 01/19/2021   Procedure: Spinal cord stimulator battery change;  Surgeon: Renaldo Fiddler, MD;  Location: [facility] OR;  Service: Neurosurgery;  Laterality: N/A;   SPINAL CORD STIMULATOR INSERTION N/A 06/16/2016   Procedure: LUMBAR SPINAL CORD STIMULATOR INSERTION;  Surgeon: Odette Fraction, MD;  Location: [facility] ORS;  Service: Neurosurgery;  Laterality: N/A;  LUMBAR SPINAL CORD STIMULATOR INSERTION   TEE WITHOUT CARDIOVERSION N/A 02/01/2023   Procedure: TRANSESOPHAGEAL ECHOCARDIOGRAM (TEE);  Surgeon: Jonelle Sidle, MD;  Location: [facility] ORS;  Service: Cardiovascular;  Laterality: N/A;   tens  05/2016   TONSILLECTOMY       Family History  Problem Relation Age of Onset   Heart attack Father    Heart attack Mother    Bladder Cancer Brother    Cervical cancer Daughter        ?   Colon cancer Neg Hx      Social History  Socioeconomic History   Marital status: Married    Spouse  name: Not on file   Number of children: 3   Years of education: Not on file   Highest education level: Not on file  Occupational History   Not on file  Tobacco Use   Smoking status: Former    Current packs/day: 0.00    Average packs/day: 0.8 packs/day for 15.0 years (11.3 ttl pk-yrs)    Types: Cigarettes    Start date: 11/21/1979    Quit date: 11/20/1994    Years since quitting: 28.7   Smokeless tobacco: Never   Tobacco comments:    Quit in 1996  Vaping Use   Vaping status: Never Used  Substance and Sexual Activity   Alcohol use: Never    Alcohol/week: 0.0 standard drinks of alcohol   Drug use: Never   Sexual activity: Yes  Other Topics Concern   Not on file  Social History Narrative   2 living children, one deceased age 76   Right handed   One story home   Drinks coffee am   Social Determinants of Health   Financial Resource Strain: Not on file  Food Insecurity: No Food Insecurity (01/28/2023)   Hunger Vital Sign    Worried About Running Out of Food in the Last Year: Never true    Ran Out of Food in the Last Year: Never true  Transportation Needs: No Transportation Needs (01/28/2023)   PRAPARE - Administrator, Civil Service (Medical): No    Lack of Transportation (Non-Medical): No  Physical Activity: Not on file  Stress: Not on file  Social Connections: Not on file  Intimate Partner Violence: Not At Risk (01/28/2023)   Humiliation, Afraid, Rape, and Kick questionnaire    Fear of Current or Ex-Partner: No    Emotionally Abused: No    Physically Abused: No    Sexually Abused: No     BP 138/64   Pulse 78   Ht 5' 2.5" (1.588 m)   Wt 132 lb (59.9 kg)   SpO2 94%   BMI 23.76 kg/m   Physical Exam:  Well appearing NAD HEENT: Unremarkable Neck:  No JVD, no thyromegally Lymphatics:  No adenopathy Back:  No CVA tenderness Lungs:  Clear HEART:  Regular rate rhythm, no murmurs, no rubs, no clicks Abd:  soft, positive bowel sounds, no organomegally, no  rebound, no guarding Ext:  2 plus pulses, no edema, no cyanosis, no clubbing Skin:  No rashes no nodules Neuro:  CN II through XII intact, motor grossly intact  DEVICE  Normal device function.  See PaceArt for details.   Assess/Plan:  Chronic systolic heart failure - her symptoms are class 2 and her EF is 30% despite maximal medical therapy.  2. Atrial tachy - she denies palpitations. Continue her coreg. 3. ICD - she is s/p insertion of a St. Jude single chamber ICD. Her device is working normally. 4. Dyslipidemia - she will continue her statin therapy with lipitor.     Sharlot Gowda Thales Knipple,MD

## 2023-08-30 NOTE — Patient Instructions (Signed)
Medication Instructions:  Your physician recommends that you continue on your current medications as directed. Please refer to the Current Medication list given to you today.  *If you need a refill on your cardiac medications before your next appointment, please call your pharmacy*   Lab Work: None If you have labs (blood work) drawn today and your tests are completely normal, you will receive your results only by: MyChart Message (if you have MyChart) OR A paper copy in the mail If you have any lab test that is abnormal or we need to change your treatment, we will call you to review the results.   Testing/Procedures: None   Follow-Up: At [facility], you and your health needs are our priority.  As part of our continuing mission to provide you with exceptional heart care, we have created designated Provider Care Teams.  These Care Teams include your primary Cardiologist (physician) and Advanced Practice Providers (APPs -  Physician Assistants and Nurse Practitioners) who all work together to provide you with the care you need, when you need it.  We recommend signing up for the patient portal called "MyChart".  Sign up information is provided on this After Visit Summary.  MyChart is used to connect with patients for Virtual Visits (Telemedicine).  Patients are able to view lab/test results, encounter notes, upcoming appointments, etc.  Non-urgent messages can be sent to your provider as well.   To learn more about what you can do with MyChart, go to [email or web address].    Your next appointment:   1 year(s)  Provider:   You may see Lewayne Bunting, MD  Other Instructions

## 2023-09-03 DIAGNOSIS — E785 Hyperlipidemia, unspecified: Secondary | ICD-10-CM | POA: Diagnosis not present

## 2023-09-03 DIAGNOSIS — J441 Chronic obstructive pulmonary disease with (acute) exacerbation: Secondary | ICD-10-CM | POA: Diagnosis not present

## 2023-09-03 DIAGNOSIS — E0590 Thyrotoxicosis, unspecified without thyrotoxic crisis or storm: Secondary | ICD-10-CM | POA: Diagnosis not present

## 2023-09-03 DIAGNOSIS — J309 Allergic rhinitis, unspecified: Secondary | ICD-10-CM | POA: Diagnosis not present

## 2023-09-03 DIAGNOSIS — I700 Atherosclerosis of aorta: Secondary | ICD-10-CM | POA: Diagnosis not present

## 2023-09-03 DIAGNOSIS — N183 Chronic kidney disease, stage 3 unspecified: Secondary | ICD-10-CM | POA: Diagnosis not present

## 2023-09-03 DIAGNOSIS — I5022 Chronic systolic (congestive) heart failure: Secondary | ICD-10-CM | POA: Diagnosis not present

## 2023-09-03 DIAGNOSIS — D638 Anemia in other chronic diseases classified elsewhere: Secondary | ICD-10-CM | POA: Diagnosis not present

## 2023-09-03 DIAGNOSIS — Z79899 Other long term (current) drug therapy: Secondary | ICD-10-CM | POA: Diagnosis not present

## 2023-09-03 DIAGNOSIS — M8588 Other specified disorders of bone density and structure, other site: Secondary | ICD-10-CM | POA: Diagnosis not present

## 2023-09-03 DIAGNOSIS — E538 Deficiency of other specified B group vitamins: Secondary | ICD-10-CM | POA: Diagnosis not present

## 2023-09-03 DIAGNOSIS — E1121 Type 2 diabetes mellitus with diabetic nephropathy: Secondary | ICD-10-CM | POA: Diagnosis not present

## 2023-09-03 DIAGNOSIS — E1122 Type 2 diabetes mellitus with diabetic chronic kidney disease: Secondary | ICD-10-CM | POA: Diagnosis not present

## 2023-09-03 DIAGNOSIS — I129 Hypertensive chronic kidney disease with stage 1 through stage 4 chronic kidney disease, or unspecified chronic kidney disease: Secondary | ICD-10-CM | POA: Diagnosis not present

## 2023-09-03 DIAGNOSIS — I130 Hypertensive heart and chronic kidney disease with heart failure and stage 1 through stage 4 chronic kidney disease, or unspecified chronic kidney disease: Secondary | ICD-10-CM | POA: Diagnosis not present

## 2023-09-03 DIAGNOSIS — K2100 Gastro-esophageal reflux disease with esophagitis, without bleeding: Secondary | ICD-10-CM | POA: Diagnosis not present

## 2023-09-03 DIAGNOSIS — Z0000 Encounter for general adult medical examination without abnormal findings: Secondary | ICD-10-CM | POA: Diagnosis not present

## 2023-09-07 ENCOUNTER — Encounter: Payer: Self-pay | Admitting: Family Medicine

## 2023-09-11 ENCOUNTER — Telehealth: Payer: Self-pay | Admitting: Pulmonary Disease

## 2023-09-11 DIAGNOSIS — Z9689 Presence of other specified functional implants: Secondary | ICD-10-CM | POA: Diagnosis not present

## 2023-09-11 DIAGNOSIS — M961 Postlaminectomy syndrome, not elsewhere classified: Secondary | ICD-10-CM | POA: Diagnosis not present

## 2023-09-11 DIAGNOSIS — M47812 Spondylosis without myelopathy or radiculopathy, cervical region: Secondary | ICD-10-CM | POA: Diagnosis not present

## 2023-09-11 NOTE — Telephone Encounter (Signed)
I have faxed the referral, notes, ONO results to [facility]

## 2023-09-11 NOTE — Telephone Encounter (Signed)
PCC's can 06/15/2023 order be sent to [facility].

## 2023-09-11 NOTE — Telephone Encounter (Signed)
Caryn Bee from [place] is calling to get a prescription order for oxygen. She was with [facility] but has switched over. Please fax to [phone]

## 2023-09-27 DIAGNOSIS — M06342 Rheumatoid nodule, left hand: Secondary | ICD-10-CM | POA: Diagnosis not present

## 2023-09-27 DIAGNOSIS — M0579 Rheumatoid arthritis with rheumatoid factor of multiple sites without organ or systems involvement: Secondary | ICD-10-CM | POA: Diagnosis not present

## 2023-09-27 DIAGNOSIS — M1009 Idiopathic gout, multiple sites: Secondary | ICD-10-CM | POA: Diagnosis not present

## 2023-10-04 ENCOUNTER — Other Ambulatory Visit: Payer: Self-pay

## 2023-10-04 DIAGNOSIS — D649 Anemia, unspecified: Secondary | ICD-10-CM

## 2023-10-04 NOTE — Progress Notes (Signed)
Lab orders entered

## 2023-10-05 ENCOUNTER — Inpatient Hospital Stay: Payer: Medicare Other | Attending: Physician Assistant

## 2023-10-05 DIAGNOSIS — N1831 Chronic kidney disease, stage 3a: Secondary | ICD-10-CM | POA: Diagnosis not present

## 2023-10-05 DIAGNOSIS — D631 Anemia in chronic kidney disease: Secondary | ICD-10-CM | POA: Insufficient documentation

## 2023-10-05 DIAGNOSIS — D649 Anemia, unspecified: Secondary | ICD-10-CM

## 2023-10-05 LAB — COMPREHENSIVE METABOLIC PANEL
ALT: 51 U/L — ABNORMAL HIGH (ref 0–44)
AST: 43 U/L — ABNORMAL HIGH (ref 15–41)
Albumin: 3.7 g/dL (ref 3.5–5.0)
Alkaline Phosphatase: 72 U/L (ref 38–126)
Anion gap: 12 (ref 5–15)
BUN: 31 mg/dL — ABNORMAL HIGH (ref 8–23)
CO2: 26 mmol/L (ref 22–32)
Calcium: 9.2 mg/dL (ref 8.9–10.3)
Chloride: 99 mmol/L (ref 98–111)
Creatinine, Ser: 1.06 mg/dL — ABNORMAL HIGH (ref 0.44–1.00)
GFR, Estimated: 55 mL/min — ABNORMAL LOW (ref >60)
Glucose, Bld: 126 mg/dL — ABNORMAL HIGH (ref 70–99)
Potassium: 4.2 mmol/L (ref 3.5–5.1)
Sodium: 137 mmol/L (ref 135–145)
Total Bilirubin: 0.4 mg/dL (ref <1.2)
Total Protein: 7 g/dL (ref 6.5–8.1)

## 2023-10-05 LAB — IRON AND TIBC
Iron: 98 ug/dL (ref 28–170)
Saturation Ratios: 23 % (ref 10.4–31.8)
TIBC: 419 ug/dL (ref 250–450)
UIBC: 321 ug/dL

## 2023-10-05 LAB — CBC WITH DIFFERENTIAL/PLATELET
Abs Immature Granulocytes: 0.1 10*3/uL — ABNORMAL HIGH (ref 0.00–0.07)
Basophils Absolute: 0 10*3/uL (ref 0.0–0.1)
Basophils Relative: 1 %
Eosinophils Absolute: 0.2 10*3/uL (ref 0.0–0.5)
Eosinophils Relative: 2 %
HCT: 29.5 % — ABNORMAL LOW (ref 36.0–46.0)
Hemoglobin: 9.5 g/dL — ABNORMAL LOW (ref 12.0–15.0)
Immature Granulocytes: 1 %
Lymphocytes Relative: 31 %
Lymphs Abs: 2.7 10*3/uL (ref 0.7–4.0)
MCH: 34.3 pg — ABNORMAL HIGH (ref 26.0–34.0)
MCHC: 32.2 g/dL (ref 30.0–36.0)
MCV: 106.5 fL — ABNORMAL HIGH (ref 80.0–100.0)
Monocytes Absolute: 1 10*3/uL (ref 0.1–1.0)
Monocytes Relative: 11 %
Neutro Abs: 4.6 10*3/uL (ref 1.7–7.7)
Neutrophils Relative %: 54 %
Platelets: 255 10*3/uL (ref 150–400)
RBC: 2.77 MIL/uL — ABNORMAL LOW (ref 3.87–5.11)
RDW: 14.9 % (ref 11.5–15.5)
WBC: 8.6 10*3/uL (ref 4.0–10.5)
nRBC: 0 % (ref 0.0–0.2)

## 2023-10-05 LAB — FERRITIN: Ferritin: 634 ng/mL — ABNORMAL HIGH (ref 11–307)

## 2023-10-12 ENCOUNTER — Inpatient Hospital Stay (HOSPITAL_BASED_OUTPATIENT_CLINIC_OR_DEPARTMENT_OTHER): Payer: Medicare Other | Admitting: Oncology

## 2023-10-12 DIAGNOSIS — N1831 Chronic kidney disease, stage 3a: Secondary | ICD-10-CM

## 2023-10-12 DIAGNOSIS — D631 Anemia in chronic kidney disease: Secondary | ICD-10-CM | POA: Diagnosis not present

## 2023-10-12 DIAGNOSIS — D649 Anemia, unspecified: Secondary | ICD-10-CM

## 2023-10-12 NOTE — Progress Notes (Unsigned)
Virtual Visit via Telephone Note  I connected with Katherine Larson on 10/12/23 at  9:00 AM EST by telephone and verified that I am speaking with the correct person using two identifiers.  Location: Patient: Home  Provider: Clinic    I discussed the limitations, risks, security and privacy concerns of performing an evaluation and management service by telephone and the availability of in person appointments. I also discussed with the patient that there may be a patient responsible charge related to this service. The patient expressed understanding and agreed to proceed.  History of Present Illness: Katherine Larson is a 77 year old female with past medical history significant for hypertension, lymphocytic colitis, CAD, COPD, GERD, type 2 diabetes, hyperlipidemia and iron deficiency anemia who was last seen in clinic on 07/20/2023.  She denies any recent hospitalizations, surgeries or changes in baseline health.  She was evaluated by cardiology on 08/30/2023 for follow-up for heart failure and worsening dyspnea and syncope.   Since that visit, she is doing well.  She continues to deny any bleeding per rectum or melena.  EGD and colonoscopy back in June/July 2022 did not reveal any active bleeding source except for internal hemorrhoids and diverticulosis.  She has received IV iron intermittently over the past few years last given on 10/21/2021 and 10/28/2021.  She was instructed to stop taking oral iron secondary to elevated ferritin levels.  She has not been on oral iron for several years.  Appetite and energy levels are stable.    Observations/Objective: Review of Systems  Respiratory:  Positive for shortness of breath.   Neurological:  Positive for sensory change and headaches.    Physical Exam Neurological:     Mental Status: She is alert.       Assessment and Plan: 1. Normocytic anemia -Baseline hemoglobin is between 9 and 11.   -She received a dose of IV Feraheme on 08/03/2023 with great  tolerance. -Labs from 10/05/2023 show hemoglobin of 9.5 (10.3), MCV 106.5.  Ferritin 634, iron saturations 23%, TIBC 419.    2. Anemia in stage 3a chronic kidney disease (HCC) -Creatinine baseline has ranged from normal to as high as 1.27 over the past 18 months. -She is followed by nephrology. -We discussed given she is fully saturated with iron at this time with no improvement of her hemoglobin, would recommend Epogen.  We also discussed if no improvement given history of intermittent elevation in her white count, possible bone marrow biopsy.  She was agreeable for both.  Follow Up Instructions: -Will discuss with Dr. Ellin Saba regarding plan moving forward.    I discussed the assessment and treatment plan with the patient. The patient was provided an opportunity to ask questions and all were answered. The patient agreed with the plan and demonstrated an understanding of the instructions.   The patient was advised to call back or seek an in-person evaluation if the symptoms worsen or if the condition fails to improve as anticipated.  I provided *** minutes of non-face-to-face time during this encounter.   Mauro Kaufmann, NP

## 2023-10-15 ENCOUNTER — Encounter (HOSPITAL_COMMUNITY): Payer: Self-pay | Admitting: Hematology

## 2023-10-16 ENCOUNTER — Other Ambulatory Visit: Payer: Self-pay

## 2023-10-16 DIAGNOSIS — D649 Anemia, unspecified: Secondary | ICD-10-CM

## 2023-10-17 ENCOUNTER — Inpatient Hospital Stay: Payer: Medicare Other

## 2023-10-17 ENCOUNTER — Ambulatory Visit: Payer: Medicare Other

## 2023-10-17 ENCOUNTER — Other Ambulatory Visit: Payer: Self-pay

## 2023-10-17 DIAGNOSIS — D649 Anemia, unspecified: Secondary | ICD-10-CM

## 2023-10-24 ENCOUNTER — Inpatient Hospital Stay: Payer: Medicare Other

## 2023-10-24 ENCOUNTER — Inpatient Hospital Stay: Payer: Medicare Other | Attending: Hematology

## 2023-10-24 VITALS — BP 134/58 | HR 83 | Temp 98.3°F | Resp 18

## 2023-10-24 DIAGNOSIS — D649 Anemia, unspecified: Secondary | ICD-10-CM

## 2023-10-24 DIAGNOSIS — N1831 Chronic kidney disease, stage 3a: Secondary | ICD-10-CM | POA: Insufficient documentation

## 2023-10-24 DIAGNOSIS — D631 Anemia in chronic kidney disease: Secondary | ICD-10-CM | POA: Diagnosis not present

## 2023-10-24 LAB — CBC
HCT: 31.2 % — ABNORMAL LOW (ref 36.0–46.0)
Hemoglobin: 9.5 g/dL — ABNORMAL LOW (ref 12.0–15.0)
MCH: 33.1 pg (ref 26.0–34.0)
MCHC: 30.4 g/dL (ref 30.0–36.0)
MCV: 108.7 fL — ABNORMAL HIGH (ref 80.0–100.0)
Platelets: 204 10*3/uL (ref 150–400)
RBC: 2.87 MIL/uL — ABNORMAL LOW (ref 3.87–5.11)
RDW: 15.2 % (ref 11.5–15.5)
WBC: 8.7 10*3/uL (ref 4.0–10.5)
nRBC: 0 % (ref 0.0–0.2)

## 2023-10-24 MED ORDER — EPOETIN ALFA-EPBX 10000 UNIT/ML IJ SOLN
10000.0000 [IU] | Freq: Once | INTRAMUSCULAR | Status: AC
  Administered 2023-10-24: 10000 [IU] via SUBCUTANEOUS
  Filled 2023-10-24: qty 1

## 2023-10-24 NOTE — Patient Instructions (Signed)
[facility] [place] - [facility]  Discharge Instructions: Thank you for choosing [facility] to provide your oncology and hematology care.  If you have a lab appointment with the Cancer Center - please note that after April 8th, 2024, all labs will be drawn in the cancer center.  You do not have to check in or register with the main entrance as you have in the past but will complete your check-in in the cancer center.  Wear comfortable clothing and clothing appropriate for easy access to any Portacath or PICC line.   We strive to give you quality time with your provider. You may need to reschedule your appointment if you arrive late (15 or more minutes).  Arriving late affects you and other patients whose appointments are after yours.  Also, if you miss three or more appointments without notifying the office, you may be dismissed from the clinic at the provider's discretion.      For prescription refill requests, have your pharmacy contact our office and allow 72 hours for refills to be completed.    Today you received the following chemotherapy and/or immunotherapy agents Retacrit      To help prevent nausea and vomiting after your treatment, we encourage you to take your nausea medication as directed.  BELOW ARE SYMPTOMS THAT SHOULD BE REPORTED IMMEDIATELY: *FEVER GREATER THAN 100.4 F (38 C) OR HIGHER *CHILLS OR SWEATING *NAUSEA AND VOMITING THAT IS NOT CONTROLLED WITH YOUR NAUSEA MEDICATION *UNUSUAL SHORTNESS OF BREATH *UNUSUAL BRUISING OR BLEEDING *URINARY PROBLEMS (pain or burning when urinating, or frequent urination) *BOWEL PROBLEMS (unusual diarrhea, constipation, pain near the anus) TENDERNESS IN MOUTH AND THROAT WITH OR WITHOUT PRESENCE OF ULCERS (sore throat, sores in mouth, or a toothache) UNUSUAL RASH, SWELLING OR PAIN  UNUSUAL VAGINAL DISCHARGE OR ITCHING   Items with * indicate a potential emergency and should be followed up  as soon as possible or go to the Emergency Department if any problems should occur.  Please show the CHEMOTHERAPY ALERT CARD or IMMUNOTHERAPY ALERT CARD at check-in to the Emergency Department and triage nurse.  Should you have questions after your visit or need to cancel or reschedule your appointment, please contact [facility] [place] - A DEPT OF [facility] [phone]  and follow the prompts.  Office hours are 8:00 a.m. to 4:30 p.m. Monday - Friday. Please note that voicemails left after 4:00 p.m. may not be returned until the following business day.  We are closed weekends and major holidays. You have access to a nurse at all times for urgent questions. Please call the main number to the clinic [phone] and follow the prompts.  For any non-urgent questions, you may also contact your provider using MyChart. We now offer e-Visits for anyone 31 and older to request care online for non-urgent symptoms. For details visit [email or web address].   Also download the MyChart app! Go to the app store, search "MyChart", open the app, select [place], and log in with your MyChart username and password.

## 2023-10-24 NOTE — Progress Notes (Signed)
Katherine Larson presents today for Retacrit injection per the provider's orders.  Hgb noted to be 9.5.  Stable during administration without incident; injection site WNL; see MAR for injection details.  Patient tolerated procedure well and without incident.  No questions or complaints noted at this time.

## 2023-10-26 DIAGNOSIS — D649 Anemia, unspecified: Secondary | ICD-10-CM | POA: Diagnosis not present

## 2023-10-30 ENCOUNTER — Other Ambulatory Visit: Payer: Self-pay

## 2023-10-30 DIAGNOSIS — D649 Anemia, unspecified: Secondary | ICD-10-CM

## 2023-10-31 ENCOUNTER — Inpatient Hospital Stay: Payer: Medicare Other

## 2023-10-31 VITALS — BP 114/56 | HR 80 | Temp 96.4°F | Resp 18

## 2023-10-31 DIAGNOSIS — N1831 Chronic kidney disease, stage 3a: Secondary | ICD-10-CM | POA: Diagnosis not present

## 2023-10-31 DIAGNOSIS — D631 Anemia in chronic kidney disease: Secondary | ICD-10-CM | POA: Diagnosis not present

## 2023-10-31 DIAGNOSIS — D649 Anemia, unspecified: Secondary | ICD-10-CM

## 2023-10-31 LAB — CBC
HCT: 32.3 % — ABNORMAL LOW (ref 36.0–46.0)
Hemoglobin: 9.8 g/dL — ABNORMAL LOW (ref 12.0–15.0)
MCH: 33 pg (ref 26.0–34.0)
MCHC: 30.3 g/dL (ref 30.0–36.0)
MCV: 108.8 fL — ABNORMAL HIGH (ref 80.0–100.0)
Platelets: 200 10*3/uL (ref 150–400)
RBC: 2.97 MIL/uL — ABNORMAL LOW (ref 3.87–5.11)
RDW: 15.3 % (ref 11.5–15.5)
WBC: 7.8 10*3/uL (ref 4.0–10.5)
nRBC: 0 % (ref 0.0–0.2)

## 2023-10-31 MED ORDER — EPOETIN ALFA-EPBX 10000 UNIT/ML IJ SOLN
10000.0000 [IU] | Freq: Once | INTRAMUSCULAR | Status: AC
  Administered 2023-10-31: 10000 [IU] via SUBCUTANEOUS
  Filled 2023-10-31: qty 1

## 2023-10-31 NOTE — Progress Notes (Signed)
Katherine Larson presents today for injection per the provider's orders.  Retacrit 10,000 administration without incident; injection site WNL; see MAR for injection details.  Patient tolerated procedure well and without incident.  No questions or complaints noted at this time. Patient's hemoglobin noted to be 9.8 today.  Discharged from clinic via wheelchair in stable condition. Alert and oriented x 3. F/U with [facility] as scheduled.

## 2023-10-31 NOTE — Patient Instructions (Signed)
 [facility] [place] [facility]  Discharge Instructions: Thank you for choosing [facility] to provide your oncology and hematology care.  If you have a lab appointment with the Cancer Center - please note that after April 8th, 2024, all labs will be drawn in the cancer center.  You do not have to check in or register with the main entrance as you have in the past but will complete your check-in in the cancer center.  Wear comfortable clothing and clothing appropriate for easy access to any Portacath or PICC line.   We strive to give you quality time with your provider. You may need to reschedule your appointment if you arrive late (15 or more minutes).  Arriving late affects you and other patients whose appointments are after yours.  Also, if you miss three or more appointments without notifying the office, you may be dismissed from the clinic at the provider's discretion.      For prescription refill requests, have your pharmacy contact our office and allow 72 hours for refills to be completed.    Today you received Retacrit 10,000U injection   BELOW ARE SYMPTOMS THAT SHOULD BE REPORTED IMMEDIATELY: *FEVER GREATER THAN 100.4 F (38 C) OR HIGHER *CHILLS OR SWEATING *NAUSEA AND VOMITING THAT IS NOT CONTROLLED WITH YOUR NAUSEA MEDICATION *UNUSUAL SHORTNESS OF BREATH *UNUSUAL BRUISING OR BLEEDING *URINARY PROBLEMS (pain or burning when urinating, or frequent urination) *BOWEL PROBLEMS (unusual diarrhea, constipation, pain near the anus) TENDERNESS IN MOUTH AND THROAT WITH OR WITHOUT PRESENCE OF ULCERS (sore throat, sores in mouth, or a toothache) UNUSUAL RASH, SWELLING OR PAIN  UNUSUAL VAGINAL DISCHARGE OR ITCHING   Items with * indicate a potential emergency and should be followed up as soon as possible or go to the Emergency Department if any problems should occur.  Please show the CHEMOTHERAPY ALERT CARD or IMMUNOTHERAPY ALERT CARD at  check-in to the Emergency Department and triage nurse.  Should you have questions after your visit or need to cancel or reschedule your appointment, please contact [facility] [place] - A DEPT OF [facility] [phone]  and follow the prompts.  Office hours are 8:00 a.m. to 4:30 p.m. Monday - Friday. Please note that voicemails left after 4:00 p.m. may not be returned until the following business day.  We are closed weekends and major holidays. You have access to a nurse at all times for urgent questions. Please call the main number to the clinic [phone] and follow the prompts.  For any non-urgent questions, you may also contact your provider using MyChart. We now offer e-Visits for anyone 3 and older to request care online for non-urgent symptoms. For details visit [email or web address].   Also download the MyChart app! Go to the app store, search "MyChart", open the app, select [place], and log in with your MyChart username and password.

## 2023-11-02 ENCOUNTER — Ambulatory Visit: Payer: Medicare Other | Attending: Student

## 2023-11-02 DIAGNOSIS — I255 Ischemic cardiomyopathy: Secondary | ICD-10-CM

## 2023-11-02 LAB — CUP PACEART REMOTE DEVICE CHECK
Battery Remaining Longevity: 92 mo
Battery Remaining Percentage: 76 %
Battery Voltage: 2.99 V
Brady Statistic RV Percent Paced: 1 %
Date Time Interrogation Session: 20241213010103
HighPow Impedance: 60 Ohm
Implantable Lead Connection Status: 753985
Implantable Lead Implant Date: 20220318
Implantable Lead Location: 753860
Implantable Pulse Generator Implant Date: 20220318
Lead Channel Impedance Value: 430 Ohm
Lead Channel Pacing Threshold Amplitude: 0.75 V
Lead Channel Pacing Threshold Pulse Width: 0.5 ms
Lead Channel Sensing Intrinsic Amplitude: 12 mV
Lead Channel Setting Pacing Amplitude: 2.5 V
Lead Channel Setting Pacing Pulse Width: 0.5 ms
Lead Channel Setting Sensing Sensitivity: 0.5 mV
Pulse Gen Serial Number: 810021948
Zone Setting Status: 755011

## 2023-11-06 ENCOUNTER — Other Ambulatory Visit: Payer: Self-pay

## 2023-11-06 DIAGNOSIS — D649 Anemia, unspecified: Secondary | ICD-10-CM

## 2023-11-07 ENCOUNTER — Inpatient Hospital Stay: Payer: Medicare Other

## 2023-11-07 VITALS — BP 117/58 | HR 84 | Temp 97.9°F | Resp 18

## 2023-11-07 DIAGNOSIS — D631 Anemia in chronic kidney disease: Secondary | ICD-10-CM | POA: Diagnosis not present

## 2023-11-07 DIAGNOSIS — N1831 Chronic kidney disease, stage 3a: Secondary | ICD-10-CM | POA: Diagnosis not present

## 2023-11-07 DIAGNOSIS — D649 Anemia, unspecified: Secondary | ICD-10-CM

## 2023-11-07 LAB — CBC
HCT: 34.1 % — ABNORMAL LOW (ref 36.0–46.0)
Hemoglobin: 10.7 g/dL — ABNORMAL LOW (ref 12.0–15.0)
MCH: 33.8 pg (ref 26.0–34.0)
MCHC: 31.4 g/dL (ref 30.0–36.0)
MCV: 107.6 fL — ABNORMAL HIGH (ref 80.0–100.0)
Platelets: 199 10*3/uL (ref 150–400)
RBC: 3.17 MIL/uL — ABNORMAL LOW (ref 3.87–5.11)
RDW: 15.4 % (ref 11.5–15.5)
WBC: 9.2 10*3/uL (ref 4.0–10.5)
nRBC: 0 % (ref 0.0–0.2)

## 2023-11-07 MED ORDER — EPOETIN ALFA-EPBX 10000 UNIT/ML IJ SOLN
10000.0000 [IU] | Freq: Once | INTRAMUSCULAR | Status: AC
  Administered 2023-11-07: 10000 [IU] via SUBCUTANEOUS
  Filled 2023-11-07: qty 1

## 2023-11-07 NOTE — Patient Instructions (Signed)

## 2023-11-07 NOTE — Progress Notes (Signed)
Patient presents today for Retacrit injection. Hemoglobin reviewed prior to administration. VSS tolerated without incident or complaint. See MAR for details. Patient stable during and after injection. Patient discharged in satisfactory condition with no s/s of distress noted.  

## 2023-11-13 ENCOUNTER — Other Ambulatory Visit: Payer: Self-pay

## 2023-11-13 DIAGNOSIS — D649 Anemia, unspecified: Secondary | ICD-10-CM

## 2023-11-15 ENCOUNTER — Inpatient Hospital Stay: Payer: Medicare Other

## 2023-11-15 VITALS — BP 106/47 | HR 74 | Temp 97.4°F | Resp 18

## 2023-11-15 DIAGNOSIS — D649 Anemia, unspecified: Secondary | ICD-10-CM

## 2023-11-15 DIAGNOSIS — D631 Anemia in chronic kidney disease: Secondary | ICD-10-CM | POA: Diagnosis not present

## 2023-11-15 DIAGNOSIS — N1831 Chronic kidney disease, stage 3a: Secondary | ICD-10-CM | POA: Diagnosis not present

## 2023-11-15 LAB — CBC
HCT: 33.1 % — ABNORMAL LOW (ref 36.0–46.0)
Hemoglobin: 10.3 g/dL — ABNORMAL LOW (ref 12.0–15.0)
MCH: 33.3 pg (ref 26.0–34.0)
MCHC: 31.1 g/dL (ref 30.0–36.0)
MCV: 107.1 fL — ABNORMAL HIGH (ref 80.0–100.0)
Platelets: 205 10*3/uL (ref 150–400)
RBC: 3.09 MIL/uL — ABNORMAL LOW (ref 3.87–5.11)
RDW: 15.2 % (ref 11.5–15.5)
WBC: 6.7 10*3/uL (ref 4.0–10.5)
nRBC: 0 % (ref 0.0–0.2)

## 2023-11-15 MED ORDER — EPOETIN ALFA-EPBX 10000 UNIT/ML IJ SOLN
10000.0000 [IU] | Freq: Once | INTRAMUSCULAR | Status: AC
  Administered 2023-11-15: 10000 [IU] via SUBCUTANEOUS
  Filled 2023-11-15: qty 1

## 2023-11-15 NOTE — Patient Instructions (Signed)
 [facility] [place] [facility]  Discharge Instructions: Thank you for choosing [facility] to provide your oncology and hematology care.  If you have a lab appointment with the Cancer Center - please note that after April 8th, 2024, all labs will be drawn in the cancer center.  You do not have to check in or register with the main entrance as you have in the past but will complete your check-in in the cancer center.  Wear comfortable clothing and clothing appropriate for easy access to any Portacath or PICC line.   We strive to give you quality time with your provider. You may need to reschedule your appointment if you arrive late (15 or more minutes).  Arriving late affects you and other patients whose appointments are after yours.  Also, if you miss three or more appointments without notifying the office, you may be dismissed from the clinic at the provider's discretion.      For prescription refill requests, have your pharmacy contact our office and allow 72 hours for refills to be completed.    Today you received Retacrit 10,000U injection   BELOW ARE SYMPTOMS THAT SHOULD BE REPORTED IMMEDIATELY: *FEVER GREATER THAN 100.4 F (38 C) OR HIGHER *CHILLS OR SWEATING *NAUSEA AND VOMITING THAT IS NOT CONTROLLED WITH YOUR NAUSEA MEDICATION *UNUSUAL SHORTNESS OF BREATH *UNUSUAL BRUISING OR BLEEDING *URINARY PROBLEMS (pain or burning when urinating, or frequent urination) *BOWEL PROBLEMS (unusual diarrhea, constipation, pain near the anus) TENDERNESS IN MOUTH AND THROAT WITH OR WITHOUT PRESENCE OF ULCERS (sore throat, sores in mouth, or a toothache) UNUSUAL RASH, SWELLING OR PAIN  UNUSUAL VAGINAL DISCHARGE OR ITCHING   Items with * indicate a potential emergency and should be followed up as soon as possible or go to the Emergency Department if any problems should occur.  Please show the CHEMOTHERAPY ALERT CARD or IMMUNOTHERAPY ALERT CARD at  check-in to the Emergency Department and triage nurse.  Should you have questions after your visit or need to cancel or reschedule your appointment, please contact [facility] [place] - A DEPT OF [facility] [phone]  and follow the prompts.  Office hours are 8:00 a.m. to 4:30 p.m. Monday - Friday. Please note that voicemails left after 4:00 p.m. may not be returned until the following business day.  We are closed weekends and major holidays. You have access to a nurse at all times for urgent questions. Please call the main number to the clinic [phone] and follow the prompts.  For any non-urgent questions, you may also contact your provider using MyChart. We now offer e-Visits for anyone 3 and older to request care online for non-urgent symptoms. For details visit [email or web address].   Also download the MyChart app! Go to the app store, search "MyChart", open the app, select [place], and log in with your MyChart username and password.

## 2023-11-15 NOTE — Progress Notes (Signed)
Charmayne Sheer presents today for injection per the provider's orders.  Retacrit 10,000U administration without incident; injection site WNL; see MAR for injection details.  Patient tolerated procedure well and without incident.  No questions or complaints noted at this time. Patient's hemoglobin noted to be   Discharged from clinic via wheelchair in stable condition. Alert and oriented x 3. F/U with [facility] as scheduled.

## 2023-11-17 DIAGNOSIS — J029 Acute pharyngitis, unspecified: Secondary | ICD-10-CM | POA: Diagnosis not present

## 2023-11-17 DIAGNOSIS — R509 Fever, unspecified: Secondary | ICD-10-CM | POA: Diagnosis not present

## 2023-11-17 DIAGNOSIS — R52 Pain, unspecified: Secondary | ICD-10-CM | POA: Diagnosis not present

## 2023-11-20 ENCOUNTER — Other Ambulatory Visit: Payer: Self-pay

## 2023-11-20 DIAGNOSIS — D649 Anemia, unspecified: Secondary | ICD-10-CM

## 2023-11-22 ENCOUNTER — Inpatient Hospital Stay: Payer: Medicare Other

## 2023-11-22 ENCOUNTER — Inpatient Hospital Stay: Payer: Medicare Other | Attending: Oncology | Admitting: Oncology

## 2023-11-22 VITALS — BP 125/61 | HR 83 | Temp 98.4°F | Resp 18 | Wt 129.0 lb

## 2023-11-22 DIAGNOSIS — D649 Anemia, unspecified: Secondary | ICD-10-CM | POA: Diagnosis not present

## 2023-11-22 DIAGNOSIS — N1831 Chronic kidney disease, stage 3a: Secondary | ICD-10-CM | POA: Diagnosis not present

## 2023-11-22 DIAGNOSIS — D631 Anemia in chronic kidney disease: Secondary | ICD-10-CM | POA: Diagnosis not present

## 2023-11-22 LAB — COMPREHENSIVE METABOLIC PANEL
ALT: 29 U/L (ref 0–44)
AST: 30 U/L (ref 15–41)
Albumin: 3.9 g/dL (ref 3.5–5.0)
Alkaline Phosphatase: 68 U/L (ref 38–126)
Anion gap: 11 (ref 5–15)
BUN: 33 mg/dL — ABNORMAL HIGH (ref 8–23)
CO2: 24 mmol/L (ref 22–32)
Calcium: 9.1 mg/dL (ref 8.9–10.3)
Chloride: 100 mmol/L (ref 98–111)
Creatinine, Ser: 1.17 mg/dL — ABNORMAL HIGH (ref 0.44–1.00)
GFR, Estimated: 48 mL/min — ABNORMAL LOW (ref >60)
Glucose, Bld: 141 mg/dL — ABNORMAL HIGH (ref 70–99)
Potassium: 4.5 mmol/L (ref 3.5–5.1)
Sodium: 135 mmol/L (ref 135–145)
Total Bilirubin: 0.7 mg/dL (ref 0.0–1.2)
Total Protein: 7 g/dL (ref 6.5–8.1)

## 2023-11-22 LAB — IRON AND TIBC
Iron: 80 ug/dL (ref 28–170)
Saturation Ratios: 21 % (ref 10.4–31.8)
TIBC: 379 ug/dL (ref 250–450)
UIBC: 299 ug/dL

## 2023-11-22 LAB — CBC WITH DIFFERENTIAL/PLATELET
Abs Immature Granulocytes: 0.05 10*3/uL (ref 0.00–0.07)
Basophils Absolute: 0.1 10*3/uL (ref 0.0–0.1)
Basophils Relative: 1 %
Eosinophils Absolute: 0.2 10*3/uL (ref 0.0–0.5)
Eosinophils Relative: 2 %
HCT: 34.9 % — ABNORMAL LOW (ref 36.0–46.0)
Hemoglobin: 11 g/dL — ABNORMAL LOW (ref 12.0–15.0)
Immature Granulocytes: 1 %
Lymphocytes Relative: 32 %
Lymphs Abs: 2.4 10*3/uL (ref 0.7–4.0)
MCH: 33.1 pg (ref 26.0–34.0)
MCHC: 31.5 g/dL (ref 30.0–36.0)
MCV: 105.1 fL — ABNORMAL HIGH (ref 80.0–100.0)
Monocytes Absolute: 0.6 10*3/uL (ref 0.1–1.0)
Monocytes Relative: 8 %
Neutro Abs: 4.3 10*3/uL (ref 1.7–7.7)
Neutrophils Relative %: 56 %
Platelets: 237 10*3/uL (ref 150–400)
RBC: 3.32 MIL/uL — ABNORMAL LOW (ref 3.87–5.11)
RDW: 14.8 % (ref 11.5–15.5)
WBC: 7.6 10*3/uL (ref 4.0–10.5)
nRBC: 0 % (ref 0.0–0.2)

## 2023-11-22 LAB — FERRITIN: Ferritin: 421 ng/mL — ABNORMAL HIGH (ref 11–307)

## 2023-11-22 NOTE — Progress Notes (Signed)
 [facility]  History of Present Illness: Katherine Larson is a 78 year old female with past medical history significant for hypertension, lymphocytic colitis, CAD, COPD, GERD, type 2 diabetes, hyperlipidemia and iron  deficiency anemia who was last seen virtually on 10/12/2023.  In the interim, she denies any hospitalizations, surgeries or changes to her baseline health.  She has been receiving weekly Retacrit  10,000 units for hemoglobin less than 11.  It appears, based on labs that hemoglobin is improving and has been between 10 and 11 the last couple lab draws.  Reports overall feeling well over the past couple of months.  Denies any bleeding per rectum or melena.  She does report some increased fatigue this morning but feels like she may have overdone it yesterday putting a Christmas decorations.  She receives IV iron  intermittently over the past few years last given on 08/03/2023.  She continues oral iron  supplements.    Observations/Objective: Review of Systems  Constitutional:  Positive for malaise/fatigue.  Respiratory:  Positive for shortness of breath.   Musculoskeletal:  Positive for back pain and myalgias.  Neurological:  Positive for dizziness.    Physical Exam Constitutional:      Appearance: Normal appearance.  Cardiovascular:     Rate and Rhythm: Normal rate and regular rhythm.  Pulmonary:     Effort: Pulmonary effort is normal.     Breath sounds: Normal breath sounds.  Abdominal:     General: Bowel sounds are normal.     Palpations: Abdomen is soft.  Musculoskeletal:        General: No swelling. Normal range of motion.  Neurological:     Mental Status: She is alert and oriented to person, place, and time. Mental status is at baseline.      Assessment and Plan: 1. Normocytic anemia -Baseline hemoglobin is between 9 and 11.   -She received a dose of IV Feraheme  on 08/03/2023 with great tolerance. -Labs from 11/22/23 shows a hemoglobin of 11.0 (10.3), MCV  105.1.  Ferritin and iron  saturations are pending. -She continues oral iron  supplements.  Will call if she needs more IV iron . -Previous hematology workup from 10/17/2021 showed low iron  saturations and elevated ferritin, normal folate, copper , MMA.  Elevated B12.  SPEP negative for M spike.  Immunofixation negative for monoclonal protein.  Mildly elevated kappa light chains/normal lambda and normal ratio.  BMP shows CKD. -Bone marrow biopsy and aspirate (2015) showed no significant abnormalities and the findings were nonspecific including nutritional deficiency, medications, alcohol , toxins and infection as well as clonal myelodysplastic process. The cytogenetics showed no abnormality. -We initiated ESA given hemoglobin started to trend down.  Goal is for hemoglobin to remain between 10 and 11.  She will not need Retacrit  today given hemoglobin is 11.0.  Discussed returning to clinic in 1 week for labs and possible Retacrit  and push out from weekly to every other week moving forward.   2. Anemia in stage 3a chronic kidney disease (HCC) -Creatinine baseline has ranged from normal to as high as 1.27 over the past 18 months. -She is followed by nephrology. -Hemoglobin has improved and has ranged between 10.0-11.0 over the last 2 lab draws. - Hold Retacrit  today.  Return to clinic in 1 week for labs and Retacrit  and then will push out her appointments to every other week.  3.  Leukocytosis -This is essentially resolved. -Last 2 lab draws white blood cell count has been WNL. -Leukocytosis felt to be reactive.   Follow Up Instructions: PLAN SUMMARY: >>  Continue Retacrit  for hemoglobin less than 11.  Given stability in her counts, would recommend pushing out every 2 weeks.  >> She does not need Retacrit  today.  Return to clinic in 1 week for labs and Retacrit  and then push out every to every other week. >> Will call with results of iron  studies.  Continue oral iron  for now. >> RTC to see me with  labs and possible Retacrit  in 12 weeks.     I discussed the assessment and treatment plan with the patient. The patient was provided an opportunity to ask questions and all were answered. The patient agreed with the plan and demonstrated an understanding of the instructions.   The patient was advised to call back or seek an in-person evaluation if the symptoms worsen or if the condition fails to improve as anticipated.  I spent 20 minutes dedicated to the care of this patient (face-to-face and non-face-to-face) on the date of the encounter to include what is described in the assessment and plan.   Katherine FORBES Hope, NP

## 2023-11-22 NOTE — Progress Notes (Signed)
 Hemoglobin is 11.0 today, no injection needed per guidelines

## 2023-11-28 ENCOUNTER — Other Ambulatory Visit: Payer: Self-pay

## 2023-11-28 DIAGNOSIS — D649 Anemia, unspecified: Secondary | ICD-10-CM

## 2023-11-29 ENCOUNTER — Inpatient Hospital Stay: Payer: Medicare Other

## 2023-11-29 VITALS — BP 111/72 | HR 70 | Temp 96.7°F | Resp 18

## 2023-11-29 DIAGNOSIS — N1831 Chronic kidney disease, stage 3a: Secondary | ICD-10-CM | POA: Diagnosis not present

## 2023-11-29 DIAGNOSIS — D649 Anemia, unspecified: Secondary | ICD-10-CM

## 2023-11-29 DIAGNOSIS — D631 Anemia in chronic kidney disease: Secondary | ICD-10-CM | POA: Diagnosis not present

## 2023-11-29 LAB — CBC
HCT: 34.1 % — ABNORMAL LOW (ref 36.0–46.0)
Hemoglobin: 10.8 g/dL — ABNORMAL LOW (ref 12.0–15.0)
MCH: 33.6 pg (ref 26.0–34.0)
MCHC: 31.7 g/dL (ref 30.0–36.0)
MCV: 106.2 fL — ABNORMAL HIGH (ref 80.0–100.0)
Platelets: 196 10*3/uL (ref 150–400)
RBC: 3.21 MIL/uL — ABNORMAL LOW (ref 3.87–5.11)
RDW: 14.6 % (ref 11.5–15.5)
WBC: 8.3 10*3/uL (ref 4.0–10.5)
nRBC: 0 % (ref 0.0–0.2)

## 2023-11-29 MED ORDER — EPOETIN ALFA-EPBX 10000 UNIT/ML IJ SOLN
10000.0000 [IU] | Freq: Once | INTRAMUSCULAR | Status: AC
  Administered 2023-11-29: 10000 [IU] via SUBCUTANEOUS
  Filled 2023-11-29: qty 1

## 2023-11-29 NOTE — Progress Notes (Signed)
 Patient tolerated injection with no complaints voiced.  Site clean and dry with no bruising or swelling noted at site.  See MAR for details.  Band aid applied.  Patient stable during and after injection.  Vss with discharge and left in satisfactory condition with no s/s of distress noted.

## 2023-11-29 NOTE — Patient Instructions (Signed)

## 2023-12-06 ENCOUNTER — Other Ambulatory Visit: Payer: Self-pay

## 2023-12-06 DIAGNOSIS — D649 Anemia, unspecified: Secondary | ICD-10-CM

## 2023-12-06 NOTE — Progress Notes (Signed)
Remote ICD transmission.   

## 2023-12-07 ENCOUNTER — Inpatient Hospital Stay: Payer: Medicare Other

## 2023-12-07 ENCOUNTER — Ambulatory Visit (HOSPITAL_COMMUNITY)
Admission: RE | Admit: 2023-12-07 | Discharge: 2023-12-07 | Disposition: A | Discharge status: Home / Self Care | Payer: Medicare Other | Source: Ambulatory Visit | Attending: Pulmonary Disease

## 2023-12-07 DIAGNOSIS — D631 Anemia in chronic kidney disease: Secondary | ICD-10-CM | POA: Diagnosis not present

## 2023-12-07 DIAGNOSIS — I700 Atherosclerosis of aorta: Secondary | ICD-10-CM | POA: Diagnosis not present

## 2023-12-07 DIAGNOSIS — D649 Anemia, unspecified: Secondary | ICD-10-CM

## 2023-12-07 DIAGNOSIS — R0602 Shortness of breath: Secondary | ICD-10-CM | POA: Diagnosis not present

## 2023-12-07 DIAGNOSIS — N1831 Chronic kidney disease, stage 3a: Secondary | ICD-10-CM | POA: Diagnosis not present

## 2023-12-07 DIAGNOSIS — J984 Other disorders of lung: Secondary | ICD-10-CM | POA: Diagnosis not present

## 2023-12-07 LAB — CBC
HCT: 35.4 % — ABNORMAL LOW (ref 36.0–46.0)
Hemoglobin: 11.3 g/dL — ABNORMAL LOW (ref 12.0–15.0)
MCH: 33.9 pg (ref 26.0–34.0)
MCHC: 31.9 g/dL (ref 30.0–36.0)
MCV: 106.3 fL — ABNORMAL HIGH (ref 80.0–100.0)
Platelets: 202 10*3/uL (ref 150–400)
RBC: 3.33 MIL/uL — ABNORMAL LOW (ref 3.87–5.11)
RDW: 15.2 % (ref 11.5–15.5)
WBC: 8.6 10*3/uL (ref 4.0–10.5)
nRBC: 0 % (ref 0.0–0.2)

## 2023-12-07 NOTE — Progress Notes (Signed)
Patient's Hgb 11.3. Per orders to hold Retacrit shot today. Pt updated and all questions answered. All follow ups as scheduled.   Oral [employer]

## 2023-12-11 DIAGNOSIS — Z9689 Presence of other specified functional implants: Secondary | ICD-10-CM | POA: Diagnosis not present

## 2023-12-11 DIAGNOSIS — M961 Postlaminectomy syndrome, not elsewhere classified: Secondary | ICD-10-CM | POA: Diagnosis not present

## 2023-12-11 DIAGNOSIS — M47812 Spondylosis without myelopathy or radiculopathy, cervical region: Secondary | ICD-10-CM | POA: Diagnosis not present

## 2023-12-12 ENCOUNTER — Other Ambulatory Visit: Payer: Self-pay

## 2023-12-12 DIAGNOSIS — D649 Anemia, unspecified: Secondary | ICD-10-CM

## 2023-12-12 NOTE — Progress Notes (Signed)
See Durenda Hurt, NP note from 11/22/2023.

## 2023-12-13 ENCOUNTER — Inpatient Hospital Stay: Payer: Medicare Other

## 2023-12-13 VITALS — BP 112/57 | HR 76 | Temp 98.5°F | Resp 18

## 2023-12-13 DIAGNOSIS — D649 Anemia, unspecified: Secondary | ICD-10-CM

## 2023-12-13 DIAGNOSIS — N1831 Chronic kidney disease, stage 3a: Secondary | ICD-10-CM | POA: Diagnosis not present

## 2023-12-13 DIAGNOSIS — D631 Anemia in chronic kidney disease: Secondary | ICD-10-CM | POA: Diagnosis not present

## 2023-12-13 LAB — CBC
HCT: 34.4 % — ABNORMAL LOW (ref 36.0–46.0)
Hemoglobin: 10.7 g/dL — ABNORMAL LOW (ref 12.0–15.0)
MCH: 33.1 pg (ref 26.0–34.0)
MCHC: 31.1 g/dL (ref 30.0–36.0)
MCV: 106.5 fL — ABNORMAL HIGH (ref 80.0–100.0)
Platelets: 182 10*3/uL (ref 150–400)
RBC: 3.23 MIL/uL — ABNORMAL LOW (ref 3.87–5.11)
RDW: 14.6 % (ref 11.5–15.5)
WBC: 7.8 10*3/uL (ref 4.0–10.5)
nRBC: 0 % (ref 0.0–0.2)

## 2023-12-13 MED ORDER — EPOETIN ALFA-EPBX 10000 UNIT/ML IJ SOLN
10000.0000 [IU] | Freq: Once | INTRAMUSCULAR | Status: AC
  Administered 2023-12-13: 10000 [IU] via SUBCUTANEOUS
  Filled 2023-12-13: qty 1

## 2023-12-13 NOTE — Progress Notes (Signed)
Patient presents today for Retacrit injection. Hemoglobin reviewed prior to administration. VSS tolerated without incident or complaint. See MAR for details. Patient stable during and after injection. Patient discharged in satisfactory condition with no s/s of distress noted.  

## 2023-12-13 NOTE — Patient Instructions (Signed)

## 2023-12-14 DIAGNOSIS — H353134 Nonexudative age-related macular degeneration, bilateral, advanced atrophic with subfoveal involvement: Secondary | ICD-10-CM | POA: Diagnosis not present

## 2023-12-14 DIAGNOSIS — H35342 Macular cyst, hole, or pseudohole, left eye: Secondary | ICD-10-CM | POA: Diagnosis not present

## 2023-12-26 ENCOUNTER — Other Ambulatory Visit: Payer: Self-pay | Admitting: Oncology

## 2023-12-26 DIAGNOSIS — D649 Anemia, unspecified: Secondary | ICD-10-CM

## 2023-12-27 ENCOUNTER — Inpatient Hospital Stay: Payer: Medicare Other

## 2023-12-27 ENCOUNTER — Inpatient Hospital Stay: Payer: Medicare Other | Attending: Hematology

## 2023-12-27 DIAGNOSIS — D631 Anemia in chronic kidney disease: Secondary | ICD-10-CM | POA: Insufficient documentation

## 2023-12-27 DIAGNOSIS — M1991 Primary osteoarthritis, unspecified site: Secondary | ICD-10-CM | POA: Diagnosis not present

## 2023-12-27 DIAGNOSIS — N1831 Chronic kidney disease, stage 3a: Secondary | ICD-10-CM | POA: Insufficient documentation

## 2023-12-27 DIAGNOSIS — D649 Anemia, unspecified: Secondary | ICD-10-CM

## 2023-12-27 DIAGNOSIS — Z6822 Body mass index (BMI) 22.0-22.9, adult: Secondary | ICD-10-CM | POA: Diagnosis not present

## 2023-12-27 DIAGNOSIS — M0579 Rheumatoid arthritis with rheumatoid factor of multiple sites without organ or systems involvement: Secondary | ICD-10-CM | POA: Diagnosis not present

## 2023-12-27 DIAGNOSIS — M1009 Idiopathic gout, multiple sites: Secondary | ICD-10-CM | POA: Diagnosis not present

## 2023-12-27 DIAGNOSIS — Z79899 Other long term (current) drug therapy: Secondary | ICD-10-CM | POA: Diagnosis not present

## 2023-12-27 LAB — COMPREHENSIVE METABOLIC PANEL
ALT: 33 U/L (ref 0–44)
AST: 34 U/L (ref 15–41)
Albumin: 3.5 g/dL (ref 3.5–5.0)
Alkaline Phosphatase: 80 U/L (ref 38–126)
Anion gap: 11 (ref 5–15)
BUN: 32 mg/dL — ABNORMAL HIGH (ref 8–23)
CO2: 28 mmol/L (ref 22–32)
Calcium: 9.5 mg/dL (ref 8.9–10.3)
Chloride: 98 mmol/L (ref 98–111)
Creatinine, Ser: 1.04 mg/dL — ABNORMAL HIGH (ref 0.44–1.00)
GFR, Estimated: 56 mL/min — ABNORMAL LOW (ref >60)
Glucose, Bld: 114 mg/dL — ABNORMAL HIGH (ref 70–99)
Potassium: 4.4 mmol/L (ref 3.5–5.1)
Sodium: 137 mmol/L (ref 135–145)
Total Bilirubin: 0.6 mg/dL (ref 0.0–1.2)
Total Protein: 6.7 g/dL (ref 6.5–8.1)

## 2023-12-27 LAB — IRON AND TIBC
Iron: 80 ug/dL (ref 28–170)
Saturation Ratios: 21 % (ref 10.4–31.8)
TIBC: 378 ug/dL (ref 250–450)
UIBC: 298 ug/dL

## 2023-12-27 LAB — CBC WITH DIFFERENTIAL/PLATELET
Abs Immature Granulocytes: 0.03 10*3/uL (ref 0.00–0.07)
Basophils Absolute: 0.1 10*3/uL (ref 0.0–0.1)
Basophils Relative: 1 %
Eosinophils Absolute: 0.2 10*3/uL (ref 0.0–0.5)
Eosinophils Relative: 2 %
HCT: 36.1 % (ref 36.0–46.0)
Hemoglobin: 11.2 g/dL — ABNORMAL LOW (ref 12.0–15.0)
Immature Granulocytes: 0 %
Lymphocytes Relative: 32 %
Lymphs Abs: 3.1 10*3/uL (ref 0.7–4.0)
MCH: 32.5 pg (ref 26.0–34.0)
MCHC: 31 g/dL (ref 30.0–36.0)
MCV: 104.6 fL — ABNORMAL HIGH (ref 80.0–100.0)
Monocytes Absolute: 0.9 10*3/uL (ref 0.1–1.0)
Monocytes Relative: 9 %
Neutro Abs: 5.3 10*3/uL (ref 1.7–7.7)
Neutrophils Relative %: 56 %
Platelets: 226 10*3/uL (ref 150–400)
RBC: 3.45 MIL/uL — ABNORMAL LOW (ref 3.87–5.11)
RDW: 14.9 % (ref 11.5–15.5)
WBC: 9.6 10*3/uL (ref 4.0–10.5)
nRBC: 0 % (ref 0.0–0.2)

## 2023-12-27 LAB — FERRITIN: Ferritin: 288 ng/mL (ref 11–307)

## 2023-12-27 NOTE — Progress Notes (Signed)
 No injection needed, patient's hemoglobin noted to be 11.2 today. Patient made aware and verbalized understanding.

## 2024-01-02 ENCOUNTER — Encounter (INDEPENDENT_AMBULATORY_CARE_PROVIDER_SITE_OTHER): Payer: Medicare Other | Admitting: Ophthalmology

## 2024-01-02 DIAGNOSIS — Z961 Presence of intraocular lens: Secondary | ICD-10-CM

## 2024-01-02 DIAGNOSIS — H35033 Hypertensive retinopathy, bilateral: Secondary | ICD-10-CM

## 2024-01-02 DIAGNOSIS — H353134 Nonexudative age-related macular degeneration, bilateral, advanced atrophic with subfoveal involvement: Secondary | ICD-10-CM

## 2024-01-02 DIAGNOSIS — I10 Essential (primary) hypertension: Secondary | ICD-10-CM

## 2024-01-02 DIAGNOSIS — T378X5A Adverse effect of other specified systemic anti-infectives and antiparasitics, initial encounter: Secondary | ICD-10-CM

## 2024-01-07 DIAGNOSIS — N390 Urinary tract infection, site not specified: Secondary | ICD-10-CM | POA: Diagnosis not present

## 2024-01-07 DIAGNOSIS — I255 Ischemic cardiomyopathy: Secondary | ICD-10-CM | POA: Diagnosis not present

## 2024-01-07 DIAGNOSIS — D509 Iron deficiency anemia, unspecified: Secondary | ICD-10-CM | POA: Diagnosis not present

## 2024-01-07 DIAGNOSIS — M069 Rheumatoid arthritis, unspecified: Secondary | ICD-10-CM | POA: Diagnosis not present

## 2024-01-07 DIAGNOSIS — N1831 Chronic kidney disease, stage 3a: Secondary | ICD-10-CM | POA: Diagnosis not present

## 2024-01-09 ENCOUNTER — Other Ambulatory Visit: Payer: Self-pay

## 2024-01-09 DIAGNOSIS — D649 Anemia, unspecified: Secondary | ICD-10-CM

## 2024-01-10 ENCOUNTER — Inpatient Hospital Stay: Payer: Medicare Other

## 2024-01-11 ENCOUNTER — Inpatient Hospital Stay: Payer: Medicare Other

## 2024-01-14 ENCOUNTER — Inpatient Hospital Stay: Payer: Medicare Other

## 2024-01-15 NOTE — Progress Notes (Shared)
[facility] - Clinic Note  01/16/2024   CHIEF COMPLAINT Patient presents for No chief complaint on file.  HISTORY OF PRESENT ILLNESS: Katherine Larson is a 77 y.o. female who presents to the clinic today for:   Pt is here on the referral of Dr. 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 Zenaida Niece has been watching it for a year or 2, pt states she has RA and used to take Plaquenil, she does not remember what dosage she was on, but states she took it for 5-10 years, pt takes ocuvite, but does not check her vision on an amsler grid   Referring physician: Laurann Montana, MD [phone] [address] [place],  [place] [address]  HISTORICAL INFORMATION:  Selected notes from the MEDICAL RECORD NUMBER Referred by Dr. Zenaida Niece for non-exu ARMD OU LEE:  Ocular Hx- PMH-   CURRENT MEDICATIONS: No current outpatient medications on file. (Ophthalmic Drugs)   No current facility-administered medications for this visit. (Ophthalmic Drugs)   Current Outpatient Medications (Other)  Medication Sig   albuterol (PROVENTIL HFA;VENTOLIN HFA) 108 (90 Base) MCG/ACT inhaler Take 2 puffs 3 times a day and every 4 hours as needed.   allopurinol (ZYLOPRIM) 300 MG tablet Take 300 mg by mouth daily.   aspirin EC 81 MG EC tablet Take 1 tablet (81 mg total) by mouth daily.   atorvastatin (LIPITOR) 80 MG tablet Take 80 mg by mouth daily.   carvedilol (COREG) 6.25 MG tablet Patient takes 1.5 tablet by mouth twice daily.   cholecalciferol (VITAMIN D3) 25 MCG (1000 UT) tablet Take 1,000 Units by mouth daily.   dapagliflozin propanediol (FARXIGA) 10 MG TABS tablet TAKE 1 TABLET(10 MG) BY MOUTH DAILY BEFORE BREAKFAST   etanercept (ENBREL SURECLICK) 50 MG/ML injection Inject 50 mg into the skin once a week. For 30 days   ferrous sulfate 325 (65 FE) MG tablet Take 325 mg by mouth daily with breakfast. Every other day   fexofenadine (ALLEGRA) 180 MG tablet Take 180 mg by mouth daily.   fluticasone  (FLONASE) 50 MCG/ACT nasal spray Place 1 spray into both nostrils 2 (two) times daily.   furosemide (LASIX) 40 MG tablet Take 40 mg by mouth daily.   gabapentin (NEURONTIN) 300 MG capsule Take 300 mg by mouth 3 (three) times daily.   HYDROcodone-acetaminophen (NORCO) 10-325 MG tablet Take 1 tablet by mouth every 6 (six) hours as needed for moderate pain.   icosapent Ethyl (VASCEPA) 1 g capsule Take 1 capsule (1 g total) by mouth 2 (two) times daily.   lansoprazole (PREVACID) 30 MG capsule TAKE 1 CAPSULE BY MOUTH 1 TO 2 TIMES DAILY BEFORE A MEAL   leflunomide (ARAVA) 20 MG tablet Take 1 tablet (20 mg total) by mouth daily.   linaclotide (LINZESS) 72 MCG capsule Take 1 capsule (72 mcg total) by mouth daily before breakfast. As needed for constipation   losartan (COZAAR) 50 MG tablet Take 1 tablet (50 mg total) by mouth daily. (Patient taking differently: Take 50 mg by mouth daily.)   MAGNESIUM-OXIDE 400 (240 Mg) MG tablet TAKE 1 TABLET(400 MG) BY MOUTH DAILY   meclizine (ANTIVERT) 12.5 MG tablet Take 12.5 mg by mouth 3 (three) times daily as needed for dizziness.   montelukast (SINGULAIR) 10 MG tablet Take 10 mg by mouth at bedtime.   Multiple Vitamins-Minerals (OCUVITE ADULT 50+ PO) Take 1 tablet by mouth daily.   nitroGLYCERIN (NITROSTAT) 0.4 MG SL tablet DISSOLVE 1  TABLET UNDER THE  TONGUE EVERY 5 MINUTES AS NEEDED FOR CHEST PAIN. MAX OF 3 TABLETS IN 15 MINUTES. CALL 911 IF PAIN  PERSISTS.   spironolactone (ALDACTONE) 25 MG tablet Take 1 tablet (25 mg total) by mouth daily.   TRELEGY ELLIPTA 200-62.5-25 MCG/ACT AEPB Inhale 1 puff into the lungs daily.   triamcinolone cream (KENALOG) 0.1 % Apply topically as needed.   No current facility-administered medications for this visit. (Other)   REVIEW OF SYSTEMS:   ALLERGIES Allergies  Allergen Reactions   Biaxin [Clarithromycin] Rash and Other (See Comments)    Blisters in mouth    Penicillins Rash and Other (See Comments)    Blisters in  mouth Has patient had a PCN reaction causing immediate rash, facial/tongue/throat swelling, SOB or lightheadedness with hypotension: Yesyes Has patient had a PCN reaction causing severe rash involving mucus membranes or skin necrosis: Nono Has patient had a PCN reaction that required hospitalization Nono Has patient had a PCN reaction occurring within the last 10 years: Nono If all of the above answers are "NO", then may proceed   Hydroxychloroquine Other (See Comments)   Sulfamethoxazole     Other Reaction(s): Other (See Comments)   Sulfasalazine Other (See Comments)   Losartan Potassium     Other reaction(s): elevated creatinine   PAST MEDICAL HISTORY Past Medical History:  Diagnosis Date   Anemia    anemia of chronic disease +/- IDA followed by Dr. Shirline Frees. received iron infusions in the past   Asthma    Borderline glaucoma    CAD (coronary artery disease)    a. s/p CABG 1996. b. low risk nuc 2011. c. LHC 04/2016 due to drop in EF -> occluded native LAD,  widely patent sequential LIMA-D1-LAD.   Chronic kidney disease    "mild stage of kidney disease"   Chronic systolic CHF (congestive heart failure) (HCC)    Complication of anesthesia    DM2 (diabetes mellitus, type 2) (HCC)    not on any medicine for this at this time, 05/2016   Dyspnea    with exertion   GERD (gastroesophageal reflux disease)    Gout    History of blood transfusion    History of hiatal hernia    in 20s   History of pneumonia    March, 2016   HLD (hyperlipidemia)    HTN (hypertension)    Hyperthyroidism 01/21/2016   Inappropriate sinus tachycardia (HCC)    LBBB (left bundle branch block)    a. 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 Dx 04/2016 - EF 25-30%, diffuse HK, elevated LVEDP, mild MR, mod LAE.   Normocytic anemia 10/17/2021   NSVT  (nonsustained ventricular tachycardia) (HCC)    PAT (paroxysmal atrial tachycardia) (HCC)    Pneumonia    PONV (postoperative nausea and vomiting)    "after just about every surgery I've had"   Rheumatoid arthritis (HCC)    RA- hands   Seasonal allergies    Past Surgical History:  Procedure Laterality Date   ABDOMINAL HYSTERECTOMY     APPENDECTOMY     BACK SURGERY     BIOPSY  04/27/2020   Procedure: BIOPSY;  Surgeon: Corbin Ade, MD;  Location: [facility];  Service: Endoscopy;;  ascending colon biopsy   CARDIAC CATHETERIZATION  N/A 05/01/2016   Procedure: Right/Left Heart Cath and Coronary/Graft Angiography;  Surgeon: Marykay Lex, MD;  Location: [facility] INVASIVE CV LAB;  Service: Cardiovascular;  Laterality: N/A;   CHOLECYSTECTOMY     COLONOSCOPY  11/2011   Dr. Magod:ext/int hemorrhoids, diverticulosis sigmoid colon and distal desc colon, mid-desc colon hyperplastic polyps.    COLONOSCOPY WITH PROPOFOL N/A 04/27/2020   Rourk: Diverticulosis, hemorrhoids, normal terminal ileum.  Random colon biopsies significant for chronic lymphocytic colitis. No repeat due to age.   CORONARY ARTERY BYPASS GRAFT  1996   2 vessels   ESOPHAGOGASTRODUODENOSCOPY  11/2011   Dr. Ewing Schlein: small hiatal hernia, one non-bleeding superficial gastric ulcer, medium-sized diverticulum in area of papilla   ESOPHAGOGASTRODUODENOSCOPY N/A 12/29/2015   Dr. Jena Gauss: Chronic inactive gastritis, normal esophagus status post dilation, duodenal diverticula   ESOPHAGOGASTRODUODENOSCOPY (EGD) WITH PROPOFOL N/A 07/24/2019   Dr. Jena Gauss: Normal esophagus status post empiric dilation, small hiatal hernia, large D2 diverticulum   ESOPHAGOGASTRODUODENOSCOPY (EGD) WITH PROPOFOL N/A 06/03/2021   Surgeon: Corbin Ade, MD; normal esophagus, normal stomach, normal duodenum.  Abnormal hypopharyngeal mucosa bilaterally just distal to the base of the tongue (right side greater than left), overlying mucosa appeared irregular and  somewhat verrucous in appearance.  Recommended ENT evaluation.   EYE SURGERY Bilateral    cataract removal   FOOT SURGERY     removal bone spur   FRACTURE SURGERY Right    arm   GIVENS CAPSULE STUDY  11/2012   Dr. Ewing Schlein: minimal gastritis, normal small bowel capsule   HERNIA REPAIR     hiatal hernia   ICD IMPLANT N/A 02/04/2021   Procedure: ICD IMPLANT;  Surgeon: Marinus Maw, MD;  Location: [facility] INVASIVE CV LAB;  Service: Cardiovascular;  Laterality: N/A;   MALONEY DILATION N/A 07/24/2019   Procedure: Elease Hashimoto DILATION;  Surgeon: Corbin Ade, MD;  Location: [facility];  Service: Endoscopy;  Laterality: N/A;   MALONEY DILATION N/A 06/03/2021   Procedure: Elease Hashimoto DILATION;  Surgeon: Corbin Ade, MD;  Location: [facility];  Service: Endoscopy;  Laterality: N/A;   MAXIMUM ACCESS (MAS)POSTERIOR LUMBAR INTERBODY FUSION (PLIF) 1 LEVEL N/A 08/14/2013   Procedure:  MAXIMUM ACCESS SURGERY(MAS) POSTERIOR LUMBAR INTERBODY FUSION LUMBAR THREE-FOUR ;  Surgeon: Tia Alert, MD;  Location: [facility];  Service: Neurosurgery;  Laterality: N/A;   MAXIMUM ACCESS SURGERY(MAS) POSTERIOR LUMBAR INTERBODY FUSION LUMBAR THREE-FOUR    PTCA     RIGHT/LEFT HEART CATH AND CORONARY ANGIOGRAPHY N/A 03/26/2020   Procedure: RIGHT/LEFT HEART CATH AND CORONARY ANGIOGRAPHY;  Surgeon: Marykay Lex, MD;  Location: [facility] INVASIVE CV LAB;  Service: Cardiovascular;  Laterality: N/A;   SPINAL CORD STIMULATOR BATTERY EXCHANGE N/A 01/19/2021   Procedure: Spinal cord stimulator battery change;  Surgeon: Renaldo Fiddler, MD;  Location: [facility] OR;  Service: Neurosurgery;  Laterality: N/A;   SPINAL CORD STIMULATOR INSERTION N/A 06/16/2016   Procedure: LUMBAR SPINAL CORD STIMULATOR INSERTION;  Surgeon: Odette Fraction, MD;  Location: [facility];  Service: Neurosurgery;  Laterality: N/A;  LUMBAR SPINAL CORD STIMULATOR INSERTION   TEE WITHOUT CARDIOVERSION N/A 02/01/2023   Procedure: TRANSESOPHAGEAL ECHOCARDIOGRAM (TEE);   Surgeon: Jonelle Sidle, MD;  Location: [facility];  Service: Cardiovascular;  Laterality: N/A;   tens  05/2016   TONSILLECTOMY     FAMILY HISTORY Family History  Problem Relation Age of Onset   Heart attack Father    Heart attack Mother    Bladder Cancer Brother    Cervical  cancer Daughter        ?   Colon cancer Neg Hx    SOCIAL HISTORY Social History   Tobacco Use   Smoking status: Former    Current packs/day: 0.00    Average packs/day: 0.8 packs/day for 15.0 years (11.3 ttl pk-yrs)    Types: Cigarettes    Start date: 11/21/1979    Quit date: 11/20/1994    Years since quitting: 29.1   Smokeless tobacco: Never   Tobacco comments:    Quit in 1996  Vaping Use   Vaping status: Never Used  Substance Use Topics   Alcohol use: Never    Alcohol/week: 0.0 standard drinks of alcohol   Drug use: Never       OPHTHALMIC EXAM:  Not recorded    IMAGING AND PROCEDURES  Imaging and Procedures for 01/16/2024         ASSESSMENT/PLAN:   ICD-10-CM   1. 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 Hydroxychloroquine-induced retinopathy of both eyes  H35.89    T37.8X5A     3. Essential hypertension  I10     4. Hypertensive retinopathy of both eyes  H35.033     5. Pseudophakia, both eyes  Z96.1      Age related macular degeneration, non-exudative, both eyes  - advanced stage with central GA on exam and OCT  - BCVA OD 20/80, OS CF1'  - The incidence, anatomy, and pathology of dry AMD, risk of progression, and the AREDS and AREDS 2 study including smoking risks discussed with patient.  - Recommend amsler grid monitoring  - f/u 4-6 months, DFE, OCT  2. h/o Plaquenil (hydroxychloroquine [HCQ]) use for RA - pattern of GA on OCT (perifoveal ORA w/ some central sparing) consistent with HCQ toxicity - pt reports history of HCQ use for RA -- unknown dose, but duration >5 yrs, ending sometime between 2016  and 2019 - retinal toxicity noted on exam and OCT today - discussed findings, prognosis - no retinal or ophthalmic interventions indicated or recommended  - monitor  3,4. Hypertensive retinopathy OU - discussed importance of tight BP control - monitor   5. Pseudophakia OU  - s/p CE/IOL OU (Dr. Elmer Picker, ~2009)  - IOLs in good position, doing well  - monitor  Ophthalmic Meds Ordered this visit:  No orders of the defined types were placed in this encounter.    No follow-ups on file.  There are no Patient Instructions on file for this visit.  Explained the diagnoses, plan, and follow up with the patient and they expressed understanding.  Patient expressed understanding of the importance of proper follow up care.   This document serves as a record of services personally performed by Karie Chimera, MD, PhD. It was created on their behalf by Glee Arvin. Manson Passey, OA an ophthalmic technician. The creation of this record is the provider's dictation and/or activities during the visit.    Electronically signed by: Glee Arvin. Manson Passey, OA 01/15/24 12:13 PM    Karie Chimera, M.D., Ph.D. Diseases & Surgery of the Retina and Vitreous [facility] 01/16/2024     Abbreviations: M myopia (nearsighted); A astigmatism; H hyperopia (farsighted); P presbyopia; Mrx spectacle prescription;  CTL contact lenses; OD right eye; OS left eye; OU both eyes  XT exotropia; ET esotropia; PEK punctate epithelial keratitis; PEE punctate epithelial erosions; DES dry eye syndrome; MGD meibomian gland dysfunction; ATs artificial  tears; PFAT's preservative free artificial tears; NSC nuclear sclerotic cataract; PSC posterior subcapsular cataract; ERM epi-retinal membrane; PVD posterior vitreous detachment; RD retinal detachment; DM diabetes mellitus; DR diabetic retinopathy; NPDR non-proliferative diabetic retinopathy; PDR proliferative diabetic retinopathy; CSME clinically significant macular edema;  DME diabetic macular edema; dbh dot blot hemorrhages; CWS cotton wool spot; POAG primary open angle glaucoma; C/D cup-to-disc ratio; HVF humphrey visual field; GVF goldmann visual field; OCT optical coherence tomography; IOP intraocular pressure; BRVO Branch retinal vein occlusion; CRVO central retinal vein occlusion; CRAO central retinal artery occlusion; BRAO branch retinal artery occlusion; RT retinal tear; SB scleral buckle; PPV pars plana vitrectomy; VH Vitreous hemorrhage; PRP panretinal laser photocoagulation; IVK intravitreal kenalog; VMT vitreomacular traction; MH Macular hole;  NVD neovascularization of the disc; NVE neovascularization elsewhere; AREDS age related eye disease study; ARMD age related macular degeneration; POAG primary open angle glaucoma; EBMD epithelial/anterior basement membrane dystrophy; ACIOL anterior chamber intraocular lens; IOL intraocular lens; PCIOL posterior chamber intraocular lens; Phaco/IOL phacoemulsification with intraocular lens placement; PRK photorefractive keratectomy; LASIK laser assisted in situ keratomileusis; HTN hypertension; DM diabetes mellitus; COPD chronic obstructive pulmonary disease

## 2024-01-16 ENCOUNTER — Encounter (INDEPENDENT_AMBULATORY_CARE_PROVIDER_SITE_OTHER): Payer: Medicare Other | Admitting: Ophthalmology

## 2024-01-16 DIAGNOSIS — Z961 Presence of intraocular lens: Secondary | ICD-10-CM

## 2024-01-16 DIAGNOSIS — T378X5A Adverse effect of other specified systemic anti-infectives and antiparasitics, initial encounter: Secondary | ICD-10-CM

## 2024-01-16 DIAGNOSIS — H35033 Hypertensive retinopathy, bilateral: Secondary | ICD-10-CM

## 2024-01-16 DIAGNOSIS — H353134 Nonexudative age-related macular degeneration, bilateral, advanced atrophic with subfoveal involvement: Secondary | ICD-10-CM

## 2024-01-16 DIAGNOSIS — I10 Essential (primary) hypertension: Secondary | ICD-10-CM

## 2024-01-22 ENCOUNTER — Other Ambulatory Visit (HOSPITAL_COMMUNITY): Payer: Self-pay

## 2024-01-22 MED ORDER — CARVEDILOL 6.25 MG PO TABS: 9.3750 mg | ORAL_TABLET | Freq: Two times a day (BID) | ORAL | 2 refills | Status: DC

## 2024-01-23 ENCOUNTER — Telehealth (HOSPITAL_COMMUNITY): Payer: Self-pay | Admitting: Cardiology

## 2024-01-23 NOTE — Telephone Encounter (Signed)
 Pt left VM on triage line to request follow up appt and refill on coreg Refill addressed 3/4  Message sent to schedulers to assist with appt

## 2024-01-24 ENCOUNTER — Inpatient Hospital Stay: Payer: Medicare Other

## 2024-01-24 ENCOUNTER — Inpatient Hospital Stay: Payer: Medicare Other | Attending: Oncology | Admitting: Oncology

## 2024-01-24 ENCOUNTER — Encounter (HOSPITAL_COMMUNITY)

## 2024-01-24 DIAGNOSIS — D72829 Elevated white blood cell count, unspecified: Secondary | ICD-10-CM

## 2024-01-24 DIAGNOSIS — E1122 Type 2 diabetes mellitus with diabetic chronic kidney disease: Secondary | ICD-10-CM | POA: Insufficient documentation

## 2024-01-24 DIAGNOSIS — N1831 Chronic kidney disease, stage 3a: Secondary | ICD-10-CM | POA: Diagnosis not present

## 2024-01-24 DIAGNOSIS — D649 Anemia, unspecified: Secondary | ICD-10-CM

## 2024-01-24 DIAGNOSIS — I129 Hypertensive chronic kidney disease with stage 1 through stage 4 chronic kidney disease, or unspecified chronic kidney disease: Secondary | ICD-10-CM | POA: Insufficient documentation

## 2024-01-24 DIAGNOSIS — D631 Anemia in chronic kidney disease: Secondary | ICD-10-CM | POA: Diagnosis not present

## 2024-01-24 LAB — CBC
HCT: 34.3 % — ABNORMAL LOW (ref 36.0–46.0)
Hemoglobin: 10.9 g/dL — ABNORMAL LOW (ref 12.0–15.0)
MCH: 33.1 pg (ref 26.0–34.0)
MCHC: 31.8 g/dL (ref 30.0–36.0)
MCV: 104.3 fL — ABNORMAL HIGH (ref 80.0–100.0)
Platelets: 262 10*3/uL (ref 150–400)
RBC: 3.29 MIL/uL — ABNORMAL LOW (ref 3.87–5.11)
RDW: 14.7 % (ref 11.5–15.5)
WBC: 13.5 10*3/uL — ABNORMAL HIGH (ref 4.0–10.5)
nRBC: 0 % (ref 0.0–0.2)

## 2024-01-24 MED ORDER — EPOETIN ALFA-EPBX 10000 UNIT/ML IJ SOLN
10000.0000 [IU] | Freq: Once | INTRAMUSCULAR | Status: AC
  Administered 2024-01-24: 10000 [IU] via SUBCUTANEOUS
  Filled 2024-01-24: qty 1

## 2024-01-24 NOTE — Patient Instructions (Signed)

## 2024-01-24 NOTE — Progress Notes (Signed)
Patient presents today for Retacrit injection. Hemoglobin reviewed prior to administration. VSS tolerated without incident or complaint. See MAR for details. Patient stable during and after injection. Patient discharged in satisfactory condition with no s/s of distress noted.  

## 2024-01-24 NOTE — Progress Notes (Signed)
 [facility]  History of Present Illness: [name] is a 78 year old female with past medical history significant for hypertension, lymphocytic colitis, CAD, COPD, GERD, type 2 diabetes, hyperlipidemia and iron deficiency anemia who was last seen by me on 11/22/2023.  In the interim, she denies any hospitalizations, surgeries or changes to her baseline health.  She has been receiving weekly Retacrit 10,000 units for hemoglobin less than 11 until early January where she was moved out to biweekly because her hemoglobin was remaining stable..  It appears, based on labs that hemoglobin continues to improve and stabilize and has remained between 10 and 11 over the last 2-1/2 months. Last Retacrit was on 12/13/2023.  Reports overall feeling well over the past couple of months.  Denies any bleeding per rectum or melena.  She does report some fatigue and dizziness intermittently.  She has right knee pain fairly consistently and rates it 10 out of 10.  She is hoping to have a knee replacement soon.  Has been using a cane and wheelchair for long distances.  Does not sleep well because of the pain.  Was recently switched from hydrocodone to oxycodone.  She receives IV iron intermittently over the past few years last given on 08/03/2023.  She continues oral iron supplements.    Observations/Objective: Review of Systems  Constitutional:  Positive for malaise/fatigue.  Musculoskeletal:  Positive for joint pain (Right Knee).  Neurological:  Positive for dizziness.    Physical Exam Constitutional:      Appearance: Normal appearance.  Cardiovascular:     Rate and Rhythm: Normal rate and regular rhythm.  Pulmonary:     Effort: Pulmonary effort is normal.     Breath sounds: Normal breath sounds.  Abdominal:     General: Bowel sounds are normal.     Palpations: Abdomen is soft.  Musculoskeletal:        General: No swelling. Normal range of motion.  Neurological:     Mental Status: She is alert  and oriented to person, place, and time. Mental status is at baseline.      Assessment and Plan: 1. Normocytic anemia -Baseline hemoglobin is between 9 and 11.   -She received a dose of IV Feraheme on 08/03/2023 with great tolerance. -Labs from 01/24/2024 show hemoglobin of 10.9 (11.2), MCV 104.3.  Ferritin was 288 iron saturation is 21%. -Recommend she continue oral iron supplements.  She does not need any additional IV iron at this time. -Previous hematology workup from 10/17/2021 showed low iron saturations and elevated ferritin, normal folate, copper, MMA.  Elevated B12.  SPEP negative for M spike.  Immunofixation negative for monoclonal protein.  Mildly elevated kappa light chains/normal lambda and normal ratio.  BMP shows CKD. -Bone marrow biopsy and aspirate (2015) showed no significant abnormalities and the findings were nonspecific including nutritional deficiency, medications, alcohol, toxins and infection as well as clonal myelodysplastic process. The cytogenetics showed no abnormality. -We initiated ESA given hemoglobin started to trend down.  Goal is for hemoglobin to remain between 10 and 11.  Recommend Retacrit for hemoglobin less than 11.  Proceed with Retacrit today.  Recommend we push out her Retacrit given stability of her counts to monthly.   2. Anemia in stage 3a chronic kidney disease (HCC) -Creatinine baseline has ranged from normal to as high as 1.27 over the past 18 months. -She is followed by nephrology. -Hemoglobin has improved and has ranged between 10.0-11.0 over the last few lab draws since we initiated Retacrit. -  Recommend Retacrit today for hemoglobin of 10.9.  Will push out her appointments to monthly with possible Retacrit depending on hemoglobin.  3.  Leukocytosis - She has had intermittent elevated white count dating as far back October 2023. -Labs from 01/24/2024 show a white count of 13.5.  She denies any infections or recent steroid use. -Will continue to  monitor given fluctuation over the years.  Follow Up Instructions: PLAN SUMMARY: >> Continue Retacrit for hemoglobin less than 11.  Given stability in her counts, would recommend pushing out meds to monthly. >> Recommend Retacrit today.  Return to clinic monthly and possible Retacrit.   >> RTC to see me with labs and possible Retacrit in 16 weeks.     I discussed the assessment and treatment plan with the patient. The patient was provided an opportunity to ask questions and all were answered. The patient agreed with the plan and demonstrated an understanding of the instructions.   The patient was advised to call back or seek an in-person evaluation if the symptoms worsen or if the condition fails to improve as anticipated.  I spent 20 minutes dedicated to the care of this patient (face-to-face and non-face-to-face) on the date of the encounter to include what is described in the assessment and plan.   Katherine Kaufmann, NP

## 2024-01-25 ENCOUNTER — Encounter (HOSPITAL_COMMUNITY): Payer: Self-pay

## 2024-01-25 ENCOUNTER — Ambulatory Visit (HOSPITAL_COMMUNITY)
Admission: RE | Admit: 2024-01-25 | Discharge: 2024-01-25 | Disposition: A | Discharge status: Home / Self Care | Source: Ambulatory Visit | Attending: Physician Assistant | Admitting: Physician Assistant

## 2024-01-25 VITALS — BP 132/80 | HR 78 | Ht 62.5 in | Wt 126.8 lb

## 2024-01-25 DIAGNOSIS — M069 Rheumatoid arthritis, unspecified: Secondary | ICD-10-CM | POA: Insufficient documentation

## 2024-01-25 DIAGNOSIS — M5459 Other low back pain: Secondary | ICD-10-CM | POA: Diagnosis not present

## 2024-01-25 DIAGNOSIS — Z951 Presence of aortocoronary bypass graft: Secondary | ICD-10-CM | POA: Insufficient documentation

## 2024-01-25 DIAGNOSIS — D631 Anemia in chronic kidney disease: Secondary | ICD-10-CM | POA: Insufficient documentation

## 2024-01-25 DIAGNOSIS — Z9682 Presence of neurostimulator: Secondary | ICD-10-CM | POA: Diagnosis not present

## 2024-01-25 DIAGNOSIS — E785 Hyperlipidemia, unspecified: Secondary | ICD-10-CM | POA: Diagnosis not present

## 2024-01-25 DIAGNOSIS — Z95810 Presence of automatic (implantable) cardiac defibrillator: Secondary | ICD-10-CM | POA: Insufficient documentation

## 2024-01-25 DIAGNOSIS — R0602 Shortness of breath: Secondary | ICD-10-CM | POA: Diagnosis present

## 2024-01-25 DIAGNOSIS — I2510 Atherosclerotic heart disease of native coronary artery without angina pectoris: Secondary | ICD-10-CM

## 2024-01-25 DIAGNOSIS — E782 Mixed hyperlipidemia: Secondary | ICD-10-CM

## 2024-01-25 DIAGNOSIS — I255 Ischemic cardiomyopathy: Secondary | ICD-10-CM | POA: Diagnosis not present

## 2024-01-25 DIAGNOSIS — K52832 Lymphocytic colitis: Secondary | ICD-10-CM | POA: Diagnosis not present

## 2024-01-25 DIAGNOSIS — N1831 Chronic kidney disease, stage 3a: Secondary | ICD-10-CM

## 2024-01-25 DIAGNOSIS — R9431 Abnormal electrocardiogram [ECG] [EKG]: Secondary | ICD-10-CM | POA: Diagnosis not present

## 2024-01-25 DIAGNOSIS — J9611 Chronic respiratory failure with hypoxia: Secondary | ICD-10-CM | POA: Insufficient documentation

## 2024-01-25 DIAGNOSIS — I5022 Chronic systolic (congestive) heart failure: Secondary | ICD-10-CM | POA: Diagnosis not present

## 2024-01-25 NOTE — Progress Notes (Addendum)
 ADVANCED HF CLINIC NOTE  PCP: Laurann Montana, MD Cardiology: Dr. Eden Emms HF Cardiology: Dr. Shirlee Latch  Chief Complaint: Heart failure follow-up  HPI: Katherine Larson is a 78 y.o. female with history of CAD s/p CABG, systolic heart failure, rheumatoid arthritis , lymphocytic colitis, CKD IIIa, normocytic anemia/leuckocytosis followed by Heme/Onc and low back pain s/p spinal stimulator.  Patient has a long h/o heart disease.  In 1996, she had CABG with sequential LIMA-LAD and diagonal. Last cath in 5/21 showed totally occluded LAD with patent LIMA-LAD and diagonal, minimal disease in LCx and RCA. LV systolic function has been reduced. She has a Secondary school teacher ICD. Echo  9/22 showed EF 25-30%, low normal RV function, moderate MR.     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 ID and Cardiology consulted, and underwent TEE, showing LVEF 30-35%, normal RV, no LAA thrombus, no valvular vegetations. She was continued on 2 weeks of ciprofloxacin.  Here today for HF follow-up. Her husband is present and assists with the history. Reports worsening joint pains and swelling including right knee pain after running out of enbrel for her RA in January. Has been working with her pharmacy to get another supply. Also notes a surgical issue with the knee. Her mobility has been extremely limited. She gets exhausted just trying to walk a few steps through the discomfort. Notes a little more shortness of breath recently. No lower extremity edema or orthopnea. Wears O2 at night. Home weight stable around 126 lb, cut not stand for clinic weight today. Recently missed a couple of doses of lasix while her husband was in the hospital.    Social History   Socioeconomic History   Marital  status: Married    Spouse name: Not on file   Number of children: 3   Years of education: Not on file   Highest education level: Not on file  Occupational History   Not on file  Tobacco Use   Smoking status: Former    Current packs/day: 0.00    Average packs/day: 0.8 packs/day for 15.0 years (11.3 ttl pk-yrs)    Types: Cigarettes    Start date: 11/21/1979    Quit date: 11/20/1994    Years since quitting: 29.2   Smokeless tobacco: Never   Tobacco comments:    Quit in 1996  Vaping Use   Vaping status: Never Used  Substance and Sexual Activity   Alcohol use: Never    Alcohol/week: 0.0 standard drinks of alcohol   Drug use: Never   Sexual activity: Yes  Other Topics Concern   Not on file  Social History Narrative   2 living children, one deceased age 25   Right handed   One story home   Drinks coffee am   Social Drivers of Corporate investment banker Strain: Not on file  Food Insecurity: No Food Insecurity (01/28/2023)   Hunger Vital Sign    Worried About Running Out of Food in the Last Year: Never true    Ran Out of Food in the Last Year: Never true  Transportation Needs: No Transportation Needs (01/28/2023)   PRAPARE - Administrator, Civil Service (Medical): No    Lack of Transportation (Non-Medical): No  Physical Activity: Not on file  Stress:  Not on file  Social Connections: Not on file  Intimate Partner Violence: Not At Risk (01/28/2023)   Humiliation, Afraid, Rape, and Kick questionnaire    Fear of Current or Ex-Partner: No    Emotionally Abused: No    Physically Abused: No    Sexually Abused: No   Family History  Problem Relation Age of Onset   Heart attack Father    Heart attack Mother    Bladder Cancer Brother    Cervical cancer Daughter        ?   Colon cancer Neg Hx    ROS: All systems reviewed and negative except as per HPI.   Current Outpatient Medications  Medication Sig Dispense Refill   albuterol (PROVENTIL HFA;VENTOLIN HFA) 108 (90  Base) MCG/ACT inhaler Take 2 puffs 3 times a day and every 4 hours as needed.     allopurinol (ZYLOPRIM) 300 MG tablet Take 300 mg by mouth daily.     aspirin EC 81 MG EC tablet Take 1 tablet (81 mg total) by mouth daily.     atorvastatin (LIPITOR) 80 MG tablet Take 80 mg by mouth daily.     carvedilol (COREG) 6.25 MG tablet Take 1.5 tablets (9.375 mg total) by mouth 2 (two) times daily with a meal. Patient takes 1.5 tablet by mouth twice daily. 90 tablet 2   cholecalciferol (VITAMIN D3) 25 MCG (1000 UT) tablet Take 1,000 Units by mouth daily.     dapagliflozin propanediol (FARXIGA) 10 MG TABS tablet TAKE 1 TABLET(10 MG) BY MOUTH DAILY BEFORE BREAKFAST 30 tablet 10   ferrous sulfate 325 (65 FE) MG tablet Take 325 mg by mouth daily with breakfast. Every other day     fexofenadine (ALLEGRA) 180 MG tablet Take 180 mg by mouth daily.     fluticasone (FLONASE) 50 MCG/ACT nasal spray Place 1 spray into both nostrils 2 (two) times daily.     furosemide (LASIX) 40 MG tablet Take 40 mg by mouth daily.     gabapentin (NEURONTIN) 300 MG capsule Take 300 mg by mouth 3 (three) times daily.     icosapent Ethyl (VASCEPA) 1 g capsule Take 1 capsule (1 g total) by mouth 2 (two) times daily. 120 capsule 10   lansoprazole (PREVACID) 30 MG capsule TAKE 1 CAPSULE BY MOUTH 1 TO 2 TIMES DAILY BEFORE A MEAL 60 capsule 5   leflunomide (ARAVA) 20 MG tablet Take 1 tablet (20 mg total) by mouth daily.     linaclotide (LINZESS) 72 MCG capsule Take 1 capsule (72 mcg total) by mouth daily before breakfast. As needed for constipation 30 capsule 5   losartan (COZAAR) 50 MG tablet Take 1 tablet (50 mg total) by mouth daily. (Patient taking differently: Take 50 mg by mouth daily.) 90 tablet 3   MAGNESIUM-OXIDE 400 (240 Mg) MG tablet TAKE 1 TABLET(400 MG) BY MOUTH DAILY 30 tablet 2   meclizine (ANTIVERT) 12.5 MG tablet Take 12.5 mg by mouth 3 (three) times daily as needed for dizziness.     montelukast (SINGULAIR) 10 MG tablet Take  10 mg by mouth at bedtime.     Multiple Vitamins-Minerals (OCUVITE ADULT 50+ PO) Take 1 tablet by mouth daily.     nitroGLYCERIN (NITROSTAT) 0.4 MG SL tablet DISSOLVE 1 TABLET UNDER THE  TONGUE EVERY 5 MINUTES AS NEEDED FOR CHEST PAIN. MAX OF 3 TABLETS IN 15 MINUTES. CALL 911 IF PAIN  PERSISTS. 75 tablet 4   spironolactone (ALDACTONE) 25 MG tablet Take 1 tablet (25  mg total) by mouth daily. 90 tablet 3   TRELEGY ELLIPTA 200-62.5-25 MCG/ACT AEPB Inhale 1 puff into the lungs daily.     triamcinolone cream (KENALOG) 0.1 % Apply topically as needed.     etanercept (ENBREL SURECLICK) 50 MG/ML injection Inject 50 mg into the skin once a week. For 30 days     oxyCODONE-acetaminophen (PERCOCET) 10-325 MG tablet Take 1 tablet by mouth 4 (four) times daily as needed.     No current facility-administered medications for this encounter.   Wt Readings from Last 3 Encounters:  01/25/24 57.5 kg (126 lb 12.8 oz)  11/22/23 58.5 kg (129 lb)  08/30/23 59.9 kg (132 lb)   BP 132/80   Pulse 78   Ht 5' 2.5" (1.588 m)   Wt 57.5 kg (126 lb 12.8 oz)   SpO2 93%   BMI 22.82 kg/m   Physical Exam General:  Thin, fatigued appearing. Arrived in wheelchair. Neck: no JVD.  Cor: Regular rate & rhythm. No rubs, gallops or murmurs. Lungs: clear Abdomen: soft, nontender, nondistended.  Extremities: R knee swelling and tenderness Neuro: alert & orientedx3. Affect pleasant   ECG: SR 79 bpm, IVCD with QRS 138 ms  Device Interrogation: No VT/VF, thoracic impedance had been trending above threshold, downtrend last few days to below threshold  Assessment/Plan: 1. Chronic systolic CHF:  - ICM. (h/o occluded LAD, s/p CABG). Cors stable. 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   - Unable to obtain cardiac MRI with spinal cord stimulator.  - TEE (3/24): LVEF 30-35%, RWMA, G1DD, normal RV, mild MR - NYHA  III, confounded by joint pain (this primarily limits her mobility). Volume looks good on exam. However,  device interrogation suggests early volume overload. Takes 40 mg lasix daily, instructed her to take an extra 40 mg this afternoon - Has been unable to tolerate Entresto d/t hypotension and chronic dizziness - Continue spiro 25 mg nightly - Continue Losartan 50 mg daily - Continue Farxiga 10 mg daily.  - Continue Coreg 9.375 mg bid (She did not tolerate 12.5 mg bid due to orthostasis).  - Kidney function and potassium stable on labs at Heme/Onc on 02/06. Has regular labs at [facility]. - Due for repeat echo, ordered - QRS 138 ms on today's ECG, probably not wide enough at this point to benefit from CRT upgrade  2. CAD s/p CABG 1996 - LHC 5/21: showed totally occluded LAD, patent RCA and LCx, patent LIMA-LAD and diagonal.  - No angina - Continue 81 mg aspirin daily - Continue atorvastatin 80 daily + Vasceapa  3. Chronic Respiratory Failure with hypoxia:  - On nocturnal O2 - CXR suggestive of chronic emphysema and scarring. Previous PFTs normal. - She is followed by Dr. Isaiah Serge - Prev declined Pulm rehab, do not think she would be able to participate with joint pain  4. CKD 3a: - baseline Cr 1.0 - Last Scr was stable at 1.04 - Follow with Dr. Arrie Aran  5. HLD - On high-intensity statin + Vascepa - Tg 115 and LDL 36 in 07/24 (at goal) - No change   Follow up in 3 months with Dr. Shirlee Latch  [facility], Dalbert Garnet, PA-C 01/25/2024

## 2024-01-25 NOTE — Patient Instructions (Addendum)
 Thank you for coming in today  If you had labs drawn today, any labs that are abnormal the clinic will call you No news is good news  Medications: Take a extra dose of 40 mg lasix today   Follow up appointments:  Your physician recommends that you schedule a follow-up appointment in:  3 months With Dr. Earlean Shawl will receive a reminder letter in the mail a few months in advance. If you don't receive a letter, please call our office to schedule the follow-up appointment.   Your physician has requested that you have an echocardiogram. Echocardiography is a painless test that uses sound waves to create images of your heart. It provides your doctor with information about the size and shape of your heart and how well your heart's chambers and valves are working. This procedure takes approximately one hour. There are no restrictions for this procedure.       Do the following things EVERYDAY: Weigh yourself in the morning before breakfast. Write it down and keep it in a log. Take your medicines as prescribed Eat low salt foods--Limit salt (sodium) to 2000 mg per day.  Stay as active as you can everyday Limit all fluids for the day to less than 2 liters   At the [facility], you and your health needs are our priority. As part of our continuing mission to provide you with exceptional heart care, we have created designated Provider Care Teams. These Care Teams include your primary Cardiologist (physician) and Advanced Practice Providers (APPs- Physician Assistants and Nurse Practitioners) who all work together to provide you with the care you need, when you need it.   You may see any of the following providers on your designated Care Team at your next follow up: Dr Arvilla Meres Dr Marca Ancona Dr. Marcos Eke, NP Robbie Lis, [place] [facility] [place], [place] Brynda Peon, NP Karle Plumber, PharmD   Please be sure to bring in all your  medications bottles to every appointment.    Thank you for choosing [facility]  If you have any questions or concerns before your next appointment please send [place] a message through [place] or call our office at [phone].    TO LEAVE A MESSAGE FOR THE NURSE SELECT OPTION 2, PLEASE LEAVE A MESSAGE INCLUDING: YOUR NAME DATE OF BIRTH CALL BACK NUMBER REASON FOR CALL**this is important as we prioritize the call backs  YOU WILL RECEIVE A CALL BACK THE SAME DAY AS LONG AS YOU CALL BEFORE 4:00 PM

## 2024-01-29 ENCOUNTER — Telehealth: Payer: Self-pay

## 2024-01-29 DIAGNOSIS — M1711 Unilateral primary osteoarthritis, right knee: Secondary | ICD-10-CM | POA: Diagnosis not present

## 2024-01-29 DIAGNOSIS — M25461 Effusion, right knee: Secondary | ICD-10-CM | POA: Diagnosis not present

## 2024-01-29 NOTE — Telephone Encounter (Signed)
 Referred to ICM by Dr Ladona Ridgel.  Provided ICM intro and patient agreeable to monthly ICM follow up.  She is a patient of Dr Ladona Ridgel and Dr Kathlyn Sacramento.  She is doing okay at this time.  Husband, Chrissie Noa is also in ICM follow up.  Advised remote transmission is scheduled for 3/14 and will follow up with her on 3/17.  She appreciated the call.

## 2024-01-29 NOTE — Telephone Encounter (Signed)
-----   Message from Nurse Richarda Osmond sent at 01/29/2024  1:48 PM EDT ----- Regarding: referral to [facility] clinic Per Dr. Junie Spencer to refer to [facility] clinic per Pt request.  Boneta Lucks RN

## 2024-02-01 ENCOUNTER — Ambulatory Visit (INDEPENDENT_AMBULATORY_CARE_PROVIDER_SITE_OTHER): Payer: Medicare Other

## 2024-02-01 DIAGNOSIS — I255 Ischemic cardiomyopathy: Secondary | ICD-10-CM

## 2024-02-01 LAB — CUP PACEART REMOTE DEVICE CHECK
Battery Remaining Longevity: 90 mo
Battery Remaining Percentage: 73 %
Battery Voltage: 2.99 V
Brady Statistic RV Percent Paced: 1 %
Date Time Interrogation Session: 20250314030351
HighPow Impedance: 56 Ohm
Implantable Lead Connection Status: 753985
Implantable Lead Implant Date: 20220318
Implantable Lead Location: 753860
Implantable Pulse Generator Implant Date: 20220318
Lead Channel Impedance Value: 530 Ohm
Lead Channel Pacing Threshold Amplitude: 0.75 V
Lead Channel Pacing Threshold Pulse Width: 0.5 ms
Lead Channel Sensing Intrinsic Amplitude: 12 mV
Lead Channel Setting Pacing Amplitude: 2.5 V
Lead Channel Setting Pacing Pulse Width: 0.5 ms
Lead Channel Setting Sensing Sensitivity: 0.5 mV
Pulse Gen Serial Number: 810021948
Zone Setting Status: 755011

## 2024-02-04 ENCOUNTER — Ambulatory Visit: Payer: Medicare Other | Attending: Occupational Therapy | Admitting: Occupational Therapy

## 2024-02-04 ENCOUNTER — Encounter: Payer: Self-pay | Admitting: Internal Medicine

## 2024-02-04 NOTE — Therapy (Deleted)
 OUTPATIENT OCCUPATIONAL THERAPY LOW VISION EVALUATION  Patient Name: Katherine Larson MRN: [id number] DOB:July 02, 1946, 78 y.o., female Today's Date: 02/04/2024  PCP: Laurann Montana, MD  REFERRING PROVIDER: [facility]  END OF SESSION:   Past Medical History:  Diagnosis Date   Anemia    anemia of chronic disease +/- IDA followed by Dr. Shirline Frees. received iron infusions in the past   Asthma    Borderline glaucoma    CAD (coronary artery disease)    a. s/p CABG 1996. b. low risk nuc 2011. c. LHC 04/2016 due to drop in EF -> occluded native LAD,  widely patent sequential LIMA-D1-LAD.   Chronic kidney disease    "mild stage of kidney disease"   Chronic systolic CHF (congestive heart failure) (HCC)    Complication of anesthesia    DM2 (diabetes mellitus, type 2) (HCC)    not on any medicine for this at this time, 05/2016   Dyspnea    with exertion   GERD (gastroesophageal reflux disease)    Gout    History of blood transfusion    History of hiatal hernia    in 20s   History of pneumonia    March, 2016   HLD (hyperlipidemia)    HTN (hypertension)    Hyperthyroidism 01/21/2016   Inappropriate sinus tachycardia (HCC)    LBBB (left bundle branch block)    a. 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 Dx 04/2016 - EF 25-30%, diffuse HK, elevated LVEDP, mild MR, mod LAE.   Normocytic anemia 10/17/2021   NSVT (nonsustained ventricular tachycardia) (HCC)    PAT (paroxysmal atrial tachycardia) (HCC)    Pneumonia    PONV (postoperative nausea and vomiting)    "after just about every surgery I've had"   Rheumatoid arthritis (HCC)    RA- hands   Seasonal allergies    Past Surgical History:  Procedure Laterality Date   ABDOMINAL HYSTERECTOMY     APPENDECTOMY     BACK SURGERY     BIOPSY  04/27/2020   Procedure: BIOPSY;   Surgeon: Corbin Ade, MD;  Location: [facility];  Service: Endoscopy;;  ascending colon biopsy   CARDIAC CATHETERIZATION N/A 05/01/2016   Procedure: Right/Left Heart Cath and Coronary/Graft Angiography;  Surgeon: Marykay Lex, MD;  Location: [facility] INVASIVE CV LAB;  Service: Cardiovascular;  Laterality: N/A;   CHOLECYSTECTOMY     COLONOSCOPY  11/2011   Dr. Magod:ext/int hemorrhoids, diverticulosis sigmoid colon and distal desc colon, mid-desc colon hyperplastic polyps.    COLONOSCOPY WITH PROPOFOL N/A 04/27/2020   Rourk: Diverticulosis, hemorrhoids, normal terminal ileum.  Random colon biopsies significant for chronic lymphocytic colitis. No repeat due to age.   CORONARY ARTERY BYPASS GRAFT  1996   2 vessels   ESOPHAGOGASTRODUODENOSCOPY  11/2011   Dr. Ewing Schlein: small hiatal hernia, one non-bleeding superficial gastric ulcer, medium-sized diverticulum in area of papilla   ESOPHAGOGASTRODUODENOSCOPY N/A 12/29/2015   Dr. Jena Gauss: Chronic inactive gastritis, normal esophagus status post dilation, duodenal diverticula   ESOPHAGOGASTRODUODENOSCOPY (EGD) WITH PROPOFOL N/A 07/24/2019   Dr. Jena Gauss: Normal esophagus status post empiric dilation, small hiatal hernia, large D2 diverticulum   ESOPHAGOGASTRODUODENOSCOPY (EGD) WITH PROPOFOL N/A 06/03/2021   Surgeon: Corbin Ade, MD; normal esophagus, normal stomach, normal duodenum.  Abnormal hypopharyngeal mucosa bilaterally just  distal to the base of the tongue (right side greater than left), overlying mucosa appeared irregular and somewhat verrucous in appearance.  Recommended ENT evaluation.   EYE SURGERY Bilateral    cataract removal   FOOT SURGERY     removal bone spur   FRACTURE SURGERY Right    arm   GIVENS CAPSULE STUDY  11/2012   Dr. Ewing Schlein: minimal gastritis, normal small bowel capsule   HERNIA REPAIR     hiatal hernia   ICD IMPLANT N/A 02/04/2021   Procedure: ICD IMPLANT;  Surgeon: Marinus Maw, MD;  Location: [facility] INVASIVE CV LAB;   Service: Cardiovascular;  Laterality: N/A;   MALONEY DILATION N/A 07/24/2019   Procedure: Elease Hashimoto DILATION;  Surgeon: Corbin Ade, MD;  Location: [facility];  Service: Endoscopy;  Laterality: N/A;   MALONEY DILATION N/A 06/03/2021   Procedure: Elease Hashimoto DILATION;  Surgeon: Corbin Ade, MD;  Location: [facility];  Service: Endoscopy;  Laterality: N/A;   MAXIMUM ACCESS (MAS)POSTERIOR LUMBAR INTERBODY FUSION (PLIF) 1 LEVEL N/A 08/14/2013   Procedure:  MAXIMUM ACCESS SURGERY(MAS) POSTERIOR LUMBAR INTERBODY FUSION LUMBAR THREE-FOUR ;  Surgeon: Tia Alert, MD;  Location: [facility] OR[facility];  Service: Neurosurgery;  Laterality: N/A;   MAXIMUM ACCESS SURGERY(MAS) POSTERIOR LUMBAR INTERBODY FUSION LUMBAR THREE-FOUR    PTCA     RIGHT/LEFT HEART CATH AND CORONARY ANGIOGRAPHY N/A 03/26/2020   Procedure: RIGHT/LEFT HEART CATH AND CORONARY ANGIOGRAPHY;  Surgeon: Marykay Lex, MD;  Location: [facility] INVASIVE CV LAB;  Service: Cardiovascular;  Laterality: N/A;   SPINAL CORD STIMULATOR BATTERY EXCHANGE N/A 01/19/2021   Procedure: Spinal cord stimulator battery change;  Surgeon: Renaldo Fiddler, MD;  Location: [facility] OR;  Service: Neurosurgery;  Laterality: N/A;   SPINAL CORD STIMULATOR INSERTION N/A 06/16/2016   Procedure: LUMBAR SPINAL CORD STIMULATOR INSERTION;  Surgeon: Odette Fraction, MD;  Location: [facility];  Service: Neurosurgery;  Laterality: N/A;  LUMBAR SPINAL CORD STIMULATOR INSERTION   TEE WITHOUT CARDIOVERSION N/A 02/01/2023   Procedure: TRANSESOPHAGEAL ECHOCARDIOGRAM (TEE);  Surgeon: Jonelle Sidle, MD;  Location: [facility] ORS;  Service: Cardiovascular;  Laterality: N/A;   tens  05/2016   TONSILLECTOMY     Patient Active Problem List   Diagnosis Date Noted   Bacteremia 02/01/2023   Pseudomonas sepsis (HCC) 01/30/2023   Acute respiratory failure with hypoxia (HCC) 01/28/2023   Multifocal pneumonia 01/28/2023   Melena 11/28/2022   Rectal bleeding 11/28/2022   Upper abdominal pain 11/28/2022    Early satiety 07/27/2022   Nausea without vomiting 07/27/2022   Hypotension 02/10/2022   Normocytic anemia 10/17/2021   Lymphocytic colitis 07/02/2020   Coronary artery disease, occlusive: CTO proxLAD 03/26/2020   Abnormal nuclear stress test 03/26/2020   Chronic diarrhea 03/02/2020   NICM- new drop in EF 25-30% 05/03/2016   Acute pulmonary edema (HCC)    LBBB (left bundle branch block) 04/29/2016   Chronic HFrEF (heart failure with reduced ejection fraction) (HCC) 04/29/2016   Abdominal pain, chronic, epigastric 03/07/2016   Septic shock (HCC)    COPD exacerbation (HCC) 01/21/2016   Hyperthyroidism 01/21/2016   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   Hypokalemia 01/19/2016   Hyponatremia 01/19/2016   Diet-controlled diabetes mellitus (HCC) 01/19/2016   CAP (community acquired pneumonia) 01/18/2016   Mucosal abnormality of stomach    Dysphagia    Constipation 12/08/2015   Gastroesophageal reflux disease 12/08/2015   Abnormal CT scan, esophagus 12/08/2015   Esophageal dysphagia 12/08/2015   Abdominal pain, epigastric 12/08/2015  Loss of weight 12/08/2015   Lower abdominal pain 12/08/2015   Back pain 08/26/2015   Headache 08/26/2015   Essential hypertension 08/26/2015   DM type 2 (diabetes mellitus, type 2) (HCC) 08/26/2015   Sinus tachycardia 06/07/2015   Diarrhea 06/07/2015   Preop cardiovascular exam 07/16/2013   Atrial flutter (HCC) 07/16/2013   Anemia 01/08/2013   Leukocytosis 12/17/2012   DYSPNEA 02/02/2010   DIABETES MELLITUS 07/28/2009   Hyperlipidemia 07/28/2009   CHOLECYSTECTOMY, HX OF 07/28/2009   Hx of CABG '96- cath 05/01/16 07/28/2009   HERNIORRHAPHY, HX OF 07/28/2009    ONSET DATE: ***  REFERRING DIAG: ***  THERAPY DIAG:  No diagnosis found.  Rationale for Evaluation and Treatment: Rehabilitation  SUBJECTIVE:   SUBJECTIVE STATEMENT: *** Pt accompanied by: {accompnied:27141}  PERTINENT HISTORY: ***  PAIN:  Are you having pain?  {OPRCPAIN:27236}  FALLS: Has patient fallen in last 6 months? {fallsyesno:27318}  LIVING ENVIRONMENT: Lives with: {OPRC lives with:25569::"lives with their family"} Lives in: {Lives in:25570} Stairs: {opstairs:27293} Has following equipment at home: {Assistive devices:23999}  PLOF: {PLOF:24004}  PATIENT GOALS: ***  OBJECTIVE:   GLASSES: ***  CURRENT MODIFICATIONS/ADAPTIONS: ***  DEVICES: ***  PHONE: ***  HAND DOMINANCE: {MISC; OT HAND [phone]}  ADLs: Overall ADLs: *** Transfers/ambulation related to ADLs: Eating: *** Grooming: *** UB Dressing: *** LB Dressing: *** Toileting: *** Bathing: *** Tub Shower transfers: *** Equipment: {equipment:25573}  IADLs: Shopping: *** Light housekeeping: *** Meal Prep: *** Community mobility: *** Medication management: *** Financial management: *** Handwriting: {OTWRITTENEXPRESSION:25361}  MOBILITY STATUS: {OTMOBILITY:25360}  FUNCTIONAL OUTCOME MEASURES: {OTFUNCTIONALMEASURES:27238}  COGNITION: Overall cognitive status: {cognition:24006}  VISUAL FINDINGS: Baseline vision: {OTBASELINEVISION:25363} Visual history: {OTVISUALHISTORY:25364}  {visionassessment:27231}   OBSERVATIONS: ***   TODAY'S TREATMENT:                                                                                                                               ***  PATIENT EDUCATION: Education details: OT Role and POC*** Person educated: {Person educated:25204} Education method: Explanation***  Education comprehension: verbalized understanding and needs further education***  HOME EXERCISE PROGRAM: Not yet initiated ***   GOALS:  SHORT TERM GOALS: Target date: 03/03/2024    Patient will independently recall at least 2 compensatory strategies for visual impairment without cueing. Baseline: Goal status: INITIAL  2.  Patient will return demonstration of visual adaptation use and verbalize understanding of how to obtain  item(s) if desired.  Baseline:  Goal status: INITIAL  3.  *** Baseline:  Goal status: {GOALSTATUS:25110}  4.  *** Baseline:  Goal status: {GOALSTATUS:25110}  5.  *** Baseline:  Goal status: {GOALSTATUS:25110}  6.  *** Baseline:  Goal status: {GOALSTATUS:25110}  LONG TERM GOALS: Target date: ***  *** Baseline:  Goal status: {GOALSTATUS:25110}  2.  *** Baseline:  Goal status: {GOALSTATUS:25110}  3.  *** Baseline:  Goal status: {GOALSTATUS:25110}  4.  *** Baseline:  Goal status: {GOALSTATUS:25110}  5.  *** Baseline:  Goal status: {GOALSTATUS:25110}  6.  *** Baseline:  Goal status: {GOALSTATUS:25110}  ASSESSMENT:  CLINICAL IMPRESSION: Patient is a 78 y.o. y.o. female who was seen today for occupational therapy evaluation for assessment of vision related impairments to ADLs and IADLs. Hx includes ***. Patient currently reports difficulty *** as a part of functional deficits and impairments as noted below. Pt to benefit from skilled OT services for education on visual impairment(s), compensatory and safety strategies, and use of AD as needed for more independent and safe completion of daily occupations.   PERFORMANCE DEFICITS: in functional skills including {OT physical skills:25468}.   IMPAIRMENTS: are limiting patient from {OT performance deficits:25471}.   CO-MORBIDITIES: {Comorbidities:25485} that affects occupational performance. Patient will benefit from skilled OT to address above impairments and improve overall function.  MODIFICATION OR ASSISTANCE TO COMPLETE EVALUATION: {OT modification:25474}  OT OCCUPATIONAL PROFILE AND HISTORY: {OT PROFILE AND HISTORY:25484}  CLINICAL DECISION MAKING: {OT CDM:25475}  REHAB POTENTIAL: {rehabpotential:25112}  EVALUATION COMPLEXITY: {Evaluation complexity:25115}    PLAN:  OT FREQUENCY: {rehab frequency:25116}  OT DURATION: {rehab duration:25117}  PLANNED INTERVENTIONS: self care/ADL training, therapeutic  exercise, therapeutic activity, functional mobility training, patient/family education, visual/perceptual remediation/compensation, coping strategies training, DME and/or AE instructions, and Re-evaluation  RECOMMENDED OTHER SERVICES: None at this time ***  CONSULTED AND AGREED WITH PLAN OF CARE: Patient ***  PLAN FOR NEXT SESSION: Go over low vision packet***   Delana Meyer, OT 02/04/2024, 10:02 AM

## 2024-02-05 ENCOUNTER — Ambulatory Visit: Attending: Internal Medicine

## 2024-02-05 DIAGNOSIS — Z95810 Presence of automatic (implantable) cardiac defibrillator: Secondary | ICD-10-CM

## 2024-02-05 DIAGNOSIS — I5022 Chronic systolic (congestive) heart failure: Secondary | ICD-10-CM

## 2024-02-05 NOTE — Progress Notes (Signed)
 EPIC Encounter for ICM Monitoring  Patient Name: Katherine Larson is a 78 y.o. female Date: 02/05/2024 Primary Care Physican: Laurann Montana, MD Primary Cardiologist: Shirlee Latch Electrophysiologist: Ladona Ridgel 01/25/2024 Office Weight: 126 lbs       1st ICM remote transmission.  Heart Failure questions reviewed.  Pt reports she has not feeling well and getting dizziness and lightheaded. Explained those symptoms could be related dehydrated.  She does not drink a lot of fluids during the day.     CorVue thoracic impedance suggesting possible dryness starting 3/12 and possible fluid accumulation from 2/5-2/14 and 2/17-2/24.  Prescribed:  Furosemide 40 mg take 1 tablet(s) (40 mg total) by mouth daily.  Labs: 12/27/2023 Creatinine 1.04, BUN 32, Potassium 4.4, Sodium 137, GFR 56  11/22/2023 Creatinine 1.17, BUN 33, Potassium 4.5, Sodium 135, GFR 48  A complete set of results can be found in Results Review.  Recommendations: Advised to drink 64 oz fluid daily to help with hydration.  Encouraged to call back if dizziness/lightheadedness does not improve.    Follow-up plan: ICM clinic phone appointment on 02/11/2024 (manual) to recheck fluid levels.   91 day device clinic remote transmission 05/02/2024.    EP/Cardiology Office Visits: Recall 04/24/2024 with Dr Shirlee Latch (3 month f/u).  Recall 08/24/2024 with Dr. Ladona Ridgel (1 year).    Copy of ICM check sent to Dr. Ladona Ridgel.   3 month ICM trend: 02/01/2024.    12-14 Month ICM trend:     Karie Soda, RN 02/05/2024 7:39 AM

## 2024-02-11 ENCOUNTER — Ambulatory Visit: Attending: Internal Medicine

## 2024-02-11 DIAGNOSIS — Z95810 Presence of automatic (implantable) cardiac defibrillator: Secondary | ICD-10-CM

## 2024-02-11 DIAGNOSIS — I5022 Chronic systolic (congestive) heart failure: Secondary | ICD-10-CM

## 2024-02-12 NOTE — Progress Notes (Signed)
 EPIC Encounter for ICM Monitoring  Patient Name: Katherine Larson is a 78 y.o. female Date: 02/12/2024 Primary Care Physican: Laurann Montana, MD Primary Cardiologist: Shirlee Latch Electrophysiologist: Ladona Ridgel 01/25/2024 Office Weight: 126 lbs                                                       Spoke with patient.  Heart Failure questions reviewed.  Pt reports she may have been drinking more than the last 2 days.    CorVue thoracic impedance suggesting change from possible dryness to possible fluid accumulation starting 3/24.   Prescribed:  Furosemide 40 mg take 1 tablet(s) (40 mg total) by mouth daily.   Labs: 12/27/2023 Creatinine 1.04, BUN 32, Potassium 4.4, Sodium 137, GFR 56  11/22/2023 Creatinine 1.17, BUN 33, Potassium 4.5, Sodium 135, GFR 48  A complete set of results can be found in Results Review.   Recommendations:  Advised to limit fluid intake to 64 oz fluid daily.     Follow-up plan: ICM clinic phone appointment on 02/20/2024 to recheck fluid levels.   91 day device clinic remote transmission 05/02/2024.     EP/Cardiology Office Visits: Recall 04/24/2024 with Dr Shirlee Latch (3 month f/u).  Recall 08/24/2024 with Dr. Ladona Ridgel (1 year).     Copy of ICM check sent to Dr. Ladona Ridgel.   3 month ICM trend: 02/12/2024.    12-14 Month ICM trend:     Karie Soda, RN 02/12/2024 2:38 PM

## 2024-02-16 ENCOUNTER — Other Ambulatory Visit (HOSPITAL_COMMUNITY): Payer: Self-pay | Admitting: Cardiology

## 2024-02-19 ENCOUNTER — Ambulatory Visit (HOSPITAL_COMMUNITY)
Admission: RE | Admit: 2024-02-19 | Discharge: 2024-02-19 | Disposition: A | Discharge status: Home / Self Care | Source: Ambulatory Visit | Attending: Cardiology | Admitting: Cardiology

## 2024-02-19 DIAGNOSIS — I5022 Chronic systolic (congestive) heart failure: Secondary | ICD-10-CM

## 2024-02-19 DIAGNOSIS — Z006 Encounter for examination for normal comparison and control in clinical research program: Secondary | ICD-10-CM

## 2024-02-19 LAB — ECHOCARDIOGRAM COMPLETE
AR max vel: 2.04 cm2
AV Area VTI: 2.21 cm2
AV Area mean vel: 2.07 cm2
AV Mean grad: 3 mmHg
AV Peak grad: 5.8 mmHg
Ao pk vel: 1.2 m/s
Area-P 1/2: 8.72 cm2
Calc EF: 47.9 %
S' Lateral: 4.2 cm
Single Plane A2C EF: 45.3 %
Single Plane A4C EF: 50.5 %

## 2024-02-19 NOTE — Research (Signed)
 SITE: [id number]     Subject # [id number]   Subprotocol: A  Inclusion Criteria  Patients who meet all of the following criteria are eligible for enrollment as study participants:  Yes No  Age > 77 years old X   Eligible to wear Holter Study X    Exclusion Criteria  Patients who meet any of these criteria are not eligible for enrollment as study participants: Yes No  1. Receiving any mechanical (respiratory or circulatory) or renal support therapy at Screening or during Visit #1.  X  2.  Any other conditions that in the opinion of the investigators are likely to prevent compliance with the study protocol or pose a safety concern if the subject participates in the study.  X  3. Poor tolerance, namely susceptible to severe skin allergies from ECG adhesive patch application.  X   Protocol: REV H                                     Residential Zip [address] (First 3 digits ONLY)                                             [employer] Informed Consent   Subject Name: Katherine Larson  Subject met inclusion and exclusion criteria.  The informed consent form, study requirements and expectations were reviewed with the subject. Subject had opportunity to read consent and questions and concerns were addressed prior to the signing of the consent form.  The subject verbalized understanding of the trial requirements.  The subject agreed to participate in the [employer] EF ACT trial and signed the informed consent at 11:54 on 19-Feb-2024.  The informed consent was obtained prior to performance of any protocol-specific procedures for the subject.  A copy of the signed informed consent was given to the subject and a copy was placed in the subject's medical record.   Dyanne Iha          Current Outpatient Medications:    albuterol (PROVENTIL HFA;VENTOLIN HFA) 108 (90 Base) MCG/ACT inhaler, Take 2 puffs 3 times a day and every 4 hours as needed., Disp: , Rfl:    allopurinol (ZYLOPRIM) 300 MG tablet, Take 300 mg  by mouth daily., Disp: , Rfl:    aspirin EC 81 MG EC tablet, Take 1 tablet (81 mg total) by mouth daily., Disp: , Rfl:    atorvastatin (LIPITOR) 80 MG tablet, Take 80 mg by mouth daily., Disp: , Rfl:    carvedilol (COREG) 6.25 MG tablet, Take 1.5 tablets (9.375 mg total) by mouth 2 (two) times daily with a meal. Patient takes 1.5 tablet by mouth twice daily., Disp: 90 tablet, Rfl: 2   cholecalciferol (VITAMIN D3) 25 MCG (1000 UT) tablet, Take 1,000 Units by mouth daily., Disp: , Rfl:    dapagliflozin propanediol (FARXIGA) 10 MG TABS tablet, TAKE 1 TABLET(10 MG) BY MOUTH DAILY BEFORE BREAKFAST, Disp: 30 tablet, Rfl: 10   etanercept (ENBREL SURECLICK) 50 MG/ML injection, Inject 50 mg into the skin once a week. For 30 days (Patient not taking: Reported on 01/25/2024), Disp: , Rfl:    ferrous sulfate 325 (65 FE) MG tablet, Take 325 mg by mouth daily with breakfast. Every other day, Disp: , Rfl:    fexofenadine (ALLEGRA) 180 MG tablet,  Take 180 mg by mouth daily., Disp: , Rfl:    fluticasone (FLONASE) 50 MCG/ACT nasal spray, Place 1 spray into both nostrils 2 (two) times daily., Disp: , Rfl:    furosemide (LASIX) 40 MG tablet, Take 40 mg by mouth daily., Disp: , Rfl:    gabapentin (NEURONTIN) 300 MG capsule, Take 300 mg by mouth 3 (three) times daily., Disp: , Rfl:    lansoprazole (PREVACID) 30 MG capsule, TAKE 1 CAPSULE BY MOUTH 1 TO 2 TIMES DAILY BEFORE A MEAL, Disp: 60 capsule, Rfl: 5   leflunomide (ARAVA) 20 MG tablet, Take 1 tablet (20 mg total) by mouth daily., Disp: , Rfl:    linaclotide (LINZESS) 72 MCG capsule, Take 1 capsule (72 mcg total) by mouth daily before breakfast. As needed for constipation, Disp: 30 capsule, Rfl: 5   losartan (COZAAR) 50 MG tablet, Take 1 tablet (50 mg total) by mouth daily. (Patient taking differently: Take 50 mg by mouth daily.), Disp: 90 tablet, Rfl: 3   MAGNESIUM-OXIDE 400 (240 Mg) MG tablet, TAKE 1 TABLET(400 MG) BY MOUTH DAILY, Disp: 30 tablet, Rfl: 2   meclizine  (ANTIVERT) 12.5 MG tablet, Take 12.5 mg by mouth 3 (three) times daily as needed for dizziness., Disp: , Rfl:    montelukast (SINGULAIR) 10 MG tablet, Take 10 mg by mouth at bedtime., Disp: , Rfl:    Multiple Vitamins-Minerals (OCUVITE ADULT 50+ PO), Take 1 tablet by mouth daily., Disp: , Rfl:    nitroGLYCERIN (NITROSTAT) 0.4 MG SL tablet, DISSOLVE 1 TABLET UNDER THE  TONGUE EVERY 5 MINUTES AS NEEDED FOR CHEST PAIN. MAX OF 3 TABLETS IN 15 MINUTES. CALL 911 IF PAIN  PERSISTS., Disp: 75 tablet, Rfl: 4   oxyCODONE-acetaminophen (PERCOCET) 10-325 MG tablet, Take 1 tablet by mouth 4 (four) times daily as needed., Disp: , Rfl:    spironolactone (ALDACTONE) 25 MG tablet, Take 1 tablet (25 mg total) by mouth daily., Disp: 90 tablet, Rfl: 3   TRELEGY ELLIPTA 200-62.5-25 MCG/ACT AEPB, Inhale 1 puff into the lungs daily., Disp: , Rfl:    triamcinolone cream (KENALOG) 0.1 %, Apply topically as needed., Disp: , Rfl:    VASCEPA 1 g capsule, TAKE 1 CAPSULE(1 GRAM) BY MOUTH TWICE DAILY, Disp: 120 capsule, Rfl: 10

## 2024-02-20 ENCOUNTER — Ambulatory Visit: Attending: Internal Medicine

## 2024-02-20 ENCOUNTER — Telehealth: Payer: Self-pay

## 2024-02-20 ENCOUNTER — Encounter (HOSPITAL_COMMUNITY): Payer: Self-pay

## 2024-02-20 DIAGNOSIS — I5022 Chronic systolic (congestive) heart failure: Secondary | ICD-10-CM

## 2024-02-20 DIAGNOSIS — Z95810 Presence of automatic (implantable) cardiac defibrillator: Secondary | ICD-10-CM

## 2024-02-20 NOTE — Progress Notes (Signed)
 Spoke with patient and heart failure questions reviewed.  Transmission results reviewed.  Pt asymptomatic for fluid accumulation but is feeling tired today.   She has no other complaints.  Advised to continue to limit fluid intake to 64 oz daily.   No changes and encouraged to call if experiencing any fluid symptoms.

## 2024-02-20 NOTE — Progress Notes (Signed)
 EPIC Encounter for ICM Monitoring  Patient Name: Katherine Larson is a 78 y.o. female Date: 02/20/2024 Primary Care Physican: Laurann Montana, MD Primary Cardiologist: Shirlee Latch Electrophysiologist: Ladona Ridgel 01/25/2024 Office Weight: 126 lbs                                                       Attempted call to patient and unable to reach.   Transmission results reviewed.    CorVue thoracic impedance suggesting fluid levels returned to normal 4/1.   Prescribed:  Furosemide 40 mg take 1 tablet(s) (40 mg total) by mouth daily.   Labs: 12/27/2023 Creatinine 1.04, BUN 32, Potassium 4.4, Sodium 137, GFR 56  11/22/2023 Creatinine 1.17, BUN 33, Potassium 4.5, Sodium 135, GFR 48  A complete set of results can be found in Results Review.   Recommendations:  Unable to reach.     Follow-up plan: ICM clinic phone appointment on 03/10/2024.   91 day device clinic remote transmission 05/02/2024.     EP/Cardiology Office Visits: Recall 04/24/2024 with Dr Shirlee Latch (3 month f/u).  Recall 08/24/2024 with Dr. Ladona Ridgel (1 year).     Copy of ICM check sent to Dr. Ladona Ridgel.   3 month ICM trend: 02/20/2024.    12-14 Month ICM trend:     Karie Soda, RN 02/20/2024 7:52 AM

## 2024-02-20 NOTE — Telephone Encounter (Signed)
Remote ICM transmission received.  Attempted call to patient regarding ICM remote transmission and left message to return call   

## 2024-02-21 DIAGNOSIS — E538 Deficiency of other specified B group vitamins: Secondary | ICD-10-CM | POA: Diagnosis not present

## 2024-02-21 DIAGNOSIS — G4710 Hypersomnia, unspecified: Secondary | ICD-10-CM | POA: Diagnosis not present

## 2024-02-21 DIAGNOSIS — N183 Chronic kidney disease, stage 3 unspecified: Secondary | ICD-10-CM | POA: Diagnosis not present

## 2024-02-21 DIAGNOSIS — E1121 Type 2 diabetes mellitus with diabetic nephropathy: Secondary | ICD-10-CM | POA: Diagnosis not present

## 2024-02-21 DIAGNOSIS — I129 Hypertensive chronic kidney disease with stage 1 through stage 4 chronic kidney disease, or unspecified chronic kidney disease: Secondary | ICD-10-CM | POA: Diagnosis not present

## 2024-02-21 DIAGNOSIS — E0590 Thyrotoxicosis, unspecified without thyrotoxic crisis or storm: Secondary | ICD-10-CM | POA: Diagnosis not present

## 2024-02-21 DIAGNOSIS — R490 Dysphonia: Secondary | ICD-10-CM | POA: Diagnosis not present

## 2024-02-21 DIAGNOSIS — I5022 Chronic systolic (congestive) heart failure: Secondary | ICD-10-CM | POA: Diagnosis not present

## 2024-02-21 NOTE — Therapy (Unsigned)
 OUTPATIENT OCCUPATIONAL THERAPY LOW VISION EVALUATION  Patient Name: Katherine Larson MRN: [id number] DOB:09-25-1946, 78 y.o., female Today's Date: 02/22/2024  PCP: Laurann Montana, MD  REFERRING PROVIDER: [facility] - Dr. Zenaida Niece  END OF SESSION:  OT End of Session - 02/22/24 1123     Visit Number 1    Number of Visits 7    Date for OT Re-Evaluation 05/16/24    Authorization Type [employer] - auth required    OT Start Time 1046    OT Stop Time 1145    OT Time Calculation (min) 59 min    Activity Tolerance Patient tolerated treatment well    Behavior During Therapy WFL for tasks assessed/performed            Past Medical History:  Diagnosis Date   Anemia    anemia of chronic disease +/- IDA followed by Dr. Shirline Frees. received iron infusions in the past   Asthma    Borderline glaucoma    CAD (coronary artery disease)    a. s/p CABG 1996. b. low risk nuc 2011. c. LHC 04/2016 due to drop in EF -> occluded native LAD,  widely patent sequential LIMA-D1-LAD.   Chronic kidney disease    "mild stage of kidney disease"   Chronic systolic CHF (congestive heart failure) (HCC)    Complication of anesthesia    DM2 (diabetes mellitus, type 2) (HCC)    not on any medicine for this at this time, 05/2016   Dyspnea    with exertion   GERD (gastroesophageal reflux disease)    Gout    History of blood transfusion    History of hiatal hernia    in 20s   History of pneumonia    March, 2016   HLD (hyperlipidemia)    HTN (hypertension)    Hyperthyroidism 01/21/2016   Inappropriate sinus tachycardia (HCC)    LBBB (left bundle branch block)    a. Seen in 04/2016   Lymphocytic colitis 04/2020   Macular degeneration    left   Myocardial infarction [facility])    1996   Neuropathy    Neuropathy    NICM (nonischemic cardiomyopathy) (HCC)    a. Dx 04/2016 - EF 25-30%, diffuse HK, elevated LVEDP, mild MR, mod LAE.   Normocytic anemia 10/17/2021   NSVT (nonsustained ventricular  tachycardia) (HCC)    PAT (paroxysmal atrial tachycardia) (HCC)    Pneumonia    PONV (postoperative nausea and vomiting)    "after just about every surgery I've had"   Rheumatoid arthritis (HCC)    RA- hands   Seasonal allergies    Past Surgical History:  Procedure Laterality Date   ABDOMINAL HYSTERECTOMY     APPENDECTOMY     BACK SURGERY     BIOPSY  04/27/2020   Procedure: BIOPSY;  Surgeon: Corbin Ade, MD;  [facility];  Service: Endoscopy;;  ascending colon biopsy   CARDIAC CATHETERIZATION N/A 05/01/2016   Procedure: Right/Left Heart Cath and Coronary/Graft Angiography;  Surgeon: Marykay Lex, MD;  Location: [facility] INVASIVE CV LAB;  Service: Cardiovascular;  Laterality: N/A;   CHOLECYSTECTOMY     COLONOSCOPY  11/2011   Dr. Magod:ext/int hemorrhoids, diverticulosis sigmoid colon and distal desc colon, mid-desc colon hyperplastic polyps.    COLONOSCOPY WITH PROPOFOL N/A 04/27/2020   Rourk: Diverticulosis, hemorrhoids, normal terminal ileum.  Random colon biopsies significant for chronic lymphocytic colitis. No repeat due to age.   CORONARY ARTERY BYPASS GRAFT  1996   2  vessels   ESOPHAGOGASTRODUODENOSCOPY  11/2011   Dr. Ewing Schlein: small hiatal hernia, one non-bleeding superficial gastric ulcer, medium-sized diverticulum in area of papilla   ESOPHAGOGASTRODUODENOSCOPY N/A 12/29/2015   Dr. Jena Gauss: Chronic inactive gastritis, normal esophagus status post dilation, duodenal diverticula   ESOPHAGOGASTRODUODENOSCOPY (EGD) WITH PROPOFOL N/A 07/24/2019   Dr. Jena Gauss: Normal esophagus status post empiric dilation, small hiatal hernia, large D2 diverticulum   ESOPHAGOGASTRODUODENOSCOPY (EGD) WITH PROPOFOL N/A 06/03/2021   Surgeon: Corbin Ade, MD; normal esophagus, normal stomach, normal duodenum.  Abnormal hypopharyngeal mucosa bilaterally just distal to the base of the tongue (right side greater than left), overlying mucosa appeared irregular and somewhat verrucous in appearance.   Recommended ENT evaluation.   EYE SURGERY Bilateral    cataract removal   FOOT SURGERY     removal bone spur   FRACTURE SURGERY Right    arm   GIVENS CAPSULE STUDY  11/2012   Dr. Ewing Schlein: minimal gastritis, normal small bowel capsule   HERNIA REPAIR     hiatal hernia   ICD IMPLANT N/A 02/04/2021   Procedure: ICD IMPLANT;  Surgeon: Marinus Maw, MD;  Location: [facility] INVASIVE CV LAB;  Service: Cardiovascular;  Laterality: N/A;   MALONEY DILATION N/A 07/24/2019   Procedure: Elease Hashimoto DILATION;  Surgeon: Corbin Ade, MD;  [facility];  Service: Endoscopy;  Laterality: N/A;   MALONEY DILATION N/A 06/03/2021   Procedure: Elease Hashimoto DILATION;  Surgeon: Corbin Ade, MD;  Location: [facility];  Service: Endoscopy;  Laterality: N/A;   MAXIMUM ACCESS (MAS)POSTERIOR LUMBAR INTERBODY FUSION (PLIF) 1 LEVEL N/A 08/14/2013   Procedure:  MAXIMUM ACCESS SURGERY(MAS) POSTERIOR LUMBAR INTERBODY FUSION LUMBAR THREE-FOUR ;  Surgeon: Tia Alert, MD;  Location: [facility];  Service: Neurosurgery;  Laterality: N/A;   MAXIMUM ACCESS SURGERY(MAS) POSTERIOR LUMBAR INTERBODY FUSION LUMBAR THREE-FOUR    PTCA     RIGHT/LEFT HEART CATH AND CORONARY ANGIOGRAPHY N/A 03/26/2020   Procedure: RIGHT/LEFT HEART CATH AND CORONARY ANGIOGRAPHY;  Surgeon: Marykay Lex, MD;  Location: [facility] INVASIVE CV LAB;  Service: Cardiovascular;  Laterality: N/A;   SPINAL CORD STIMULATOR BATTERY EXCHANGE N/A 01/19/2021   Procedure: Spinal cord stimulator battery change;  Surgeon: Renaldo Fiddler, MD;  Location: [facility] OR;  Service: Neurosurgery;  Laterality: N/A;   SPINAL CORD STIMULATOR INSERTION N/A 06/16/2016   Procedure: LUMBAR SPINAL CORD STIMULATOR INSERTION;  Surgeon: Odette Fraction, MD;  Location: [facility];  Service: Neurosurgery;  Laterality: N/A;  LUMBAR SPINAL CORD STIMULATOR INSERTION   TEE WITHOUT CARDIOVERSION N/A 02/01/2023   Procedure: TRANSESOPHAGEAL ECHOCARDIOGRAM (TEE);  Surgeon: Jonelle Sidle, MD;   Location: [facility];  Service: Cardiovascular;  Laterality: N/A;   tens  05/2016   TONSILLECTOMY     Patient Active Problem List   Diagnosis Date Noted   Bacteremia 02/01/2023   Pseudomonas sepsis (HCC) 01/30/2023   Acute respiratory failure with hypoxia (HCC) 01/28/2023   Multifocal pneumonia 01/28/2023   Melena 11/28/2022   Rectal bleeding 11/28/2022   Upper abdominal pain 11/28/2022   Early satiety 07/27/2022   Nausea without vomiting 07/27/2022   Hypotension 02/10/2022   Normocytic anemia 10/17/2021   Lymphocytic colitis 07/02/2020   Coronary artery disease, occlusive: CTO proxLAD 03/26/2020   Abnormal nuclear stress test 03/26/2020   Chronic diarrhea 03/02/2020   NICM- new drop in EF 25-30% 05/03/2016   Acute pulmonary edema (HCC)    LBBB (left bundle branch block) 04/29/2016   Chronic HFrEF (heart failure with reduced ejection  fraction) (HCC) 04/29/2016   Abdominal pain, chronic, epigastric 03/07/2016   Septic shock (HCC)    COPD exacerbation (HCC) 01/21/2016   Hyperthyroidism 01/21/2016   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   Hypokalemia 01/19/2016   Hyponatremia 01/19/2016   Diet-controlled diabetes mellitus (HCC) 01/19/2016   CAP (community acquired pneumonia) 01/18/2016   Mucosal abnormality of stomach    Dysphagia    Constipation 12/08/2015   Gastroesophageal reflux disease 12/08/2015   Abnormal CT scan, esophagus 12/08/2015   Esophageal dysphagia 12/08/2015   Abdominal pain, epigastric 12/08/2015   Loss of weight 12/08/2015   Lower abdominal pain 12/08/2015   Back pain 08/26/2015   Headache 08/26/2015   Essential hypertension 08/26/2015   DM type 2 (diabetes mellitus, type 2) (HCC) 08/26/2015   Sinus tachycardia 06/07/2015   Diarrhea 06/07/2015   Preop cardiovascular exam 07/16/2013   Atrial flutter (HCC) 07/16/2013   Anemia 01/08/2013   Leukocytosis 12/17/2012   DYSPNEA 02/02/2010   DIABETES MELLITUS 07/28/2009   Hyperlipidemia 07/28/2009    CHOLECYSTECTOMY, HX OF 07/28/2009   Hx of CABG '96- cath 05/01/16 07/28/2009   HERNIORRHAPHY, HX OF 07/28/2009    ONSET DATE: 12/18/2023 (Date of referral) - reports significant decline in vision over the last couple years  REFERRING DIAG: H35.30 (ICD-10-CM) - Macular degeneration   THERAPY DIAG:  Visuospatial deficit  Legally blind  Rationale for Evaluation and Treatment: Rehabilitation  SUBJECTIVE:   SUBJECTIVE STATEMENT: "I want to do what I can do."  Pt states she gave up driving on her own one day after going to the local dollar store. This was approximately 2 years ago.   She uses lubricant eye drops.   Pt accompanied by: significant other   PERTINENT HISTORY: PMH: DM II, CKD, CAD, hyperthyroidism, borderline glaucoma, AMD, spinal cord stimulator, RA, neuropathy in feet, MI (1996), CHF, HLD, HTN  PAIN:  Are you having pain? Yes: NPRS scale: 5/10 Pain location: R knee and back Pain description: excruciating; throbs Aggravating factors: getting up Relieving factors: positioning  FALLS: Has patient fallen in last 6 months? 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; retired Garment/textile technologist  PATIENT GOALS: be as independent as possible/do as much as she can  OBJECTIVE:   GLASSES: N/A  CURRENT MODIFICATIONS/ADAPTIONS: uses flashlight to see help read items  DEVICES: Husband reports 20 and 30 x magnifiers but pt unable to use these to read anymore  PHONE: does not know how to use; husband reports their daughter is in the process of getting the pt an iPhone  HAND DOMINANCE: Right  ADLs IADLs: Overall ADLs: mostly mod I with accommodations   Meal Prep: dependent  Handwriting: Not legible and unable  MOBILITY STATUS: Needs Assist:  min A for sit to stand and min A to navigate using RW  FUNCTIONAL OUTCOME MEASURES: AM-PAC 6 Clicks for ADLs 1. Putting on and taking off regular lower body clothing?  A little  2. Bathing (including washing, rinsing, drying)? A little 3. Toileting, which includes using toilet, bedpan, or urinal?  A little 4. Putting on and taking off regular upper body clothing?  A little 5. Personal grooming such as brushing teeth?  A little 6. Eating meals?  A little  RAW Score: 18/24 Functional Impairment: 47%  PSFS TBD at next visit  COGNITION: Overall cognitive status: Within functional limits for tasks assessed  VISUAL FINDINGS: Baseline vision: Legally blind Visual history: glaucoma and macular degeneration  Impaired Reading acuity: 20/320 - distance acuity per referral: CF @ 2' OU  OBSERVATIONS: Pt appears well-kept. Ambulates with use of RW and min to CGA, altered gait, but no LOB. No glasses donned.   TODAY'S TREATMENT:                                                                                                                               OT educated pt and spouse on use of iPhone for calling people, app use for visually impaired, and [facility]/Books on Tape options.   PATIENT EDUCATION: Education details: OT Role and POC; general recommendations Person educated: Patient and Spouse Education method: Explanation  Education comprehension: verbalized understanding and needs further education  HOME EXERCISE PROGRAM: Not yet initiated   GOALS:  SHORT TERM GOALS: Target date: 03/21/2024    Patient will independently recall at least 2 compensatory strategies for visual impairment without cueing. Baseline: Goal status: INITIAL  2.  Patient will return demonstration of visual adaptation use and verbalize understanding of how to obtain item(s) if desired.  Baseline:  Goal status: INITIAL   LONG TERM GOALS: Target date: 05/16/24   Will set appropriate  PSFS goal.  Baseline:  Goal status: INITIAL  ASSESSMENT:  CLINICAL IMPRESSION: Patient is a 78 y.o. y.o. female who was seen today for occupational therapy evaluation for assessment of vision related impairments to ADLs and IADLs. Hx includes DM II, CKD, CAD, borderline glaucoma, AMD, spinal cord stimulator, RA, hyperthyroidism, neuropathy in feet, MI (1996), CHF, HLD, and HTN. Patient currently reports difficulty with phine management  as a part of functional deficits and impairments as noted below. Pt to benefit from skilled OT services for education on visual impairment(s), compensatory and safety strategies, and use of AD as needed for more independent and safe completion of daily occupations. Pt is outside of service radius for [facility], which was considered as pt and husband have mobility issues. For this reason, OT recommended pt be seen once every other week.   PERFORMANCE DEFICITS: in functional skills including ADLs, IADLs, pain, mobility, endurance, decreased knowledge of precautions, decreased knowledge of use of DME, and vision.   IMPAIRMENTS: are limiting patient from ADLs, IADLs, leisure, and social participation.   CO-MORBIDITIES: has co-morbidities such as CHF, CAD, HTN, DMII, and chronic pain  that affects occupational performance. Patient will benefit from skilled OT to address above impairments and improve overall function.  MODIFICATION OR ASSISTANCE TO COMPLETE EVALUATION: Min-Moderate modification of tasks or assist with assess necessary to complete an evaluation.  OT OCCUPATIONAL PROFILE AND HISTORY: Detailed assessment: Review of records and additional review of physical, cognitive, psychosocial history related to current functional performance.  CLINICAL DECISION MAKING: Moderate - several treatment options, min-mod task modification necessary  REHAB POTENTIAL: Fair for goals  stated  EVALUATION COMPLEXITY: Moderate    PLAN:  OT FREQUENCY: every  other week  OT DURATION:  6 sessions  PLANNED INTERVENTIONS: self care/ADL training, therapeutic exercise, therapeutic activity, functional mobility training, patient/family education, visual/perceptual remediation/compensation, coping strategies training, DME and/or AE instructions, and Re-evaluation  RECOMMENDED OTHER SERVICES: Services for the blind  CONSULTED AND AGREED WITH PLAN OF CARE: Patient and husband  PLAN FOR NEXT SESSION: PSFS - update goal; Go over low vision packet; [facility] status; iPhone status   Delana Meyer, OT 02/22/2024, 2:27 PM

## 2024-02-22 ENCOUNTER — Encounter: Payer: Self-pay | Admitting: Occupational Therapy

## 2024-02-22 ENCOUNTER — Inpatient Hospital Stay

## 2024-02-22 ENCOUNTER — Ambulatory Visit: Attending: Family Medicine | Admitting: Occupational Therapy

## 2024-02-22 DIAGNOSIS — R41842 Visuospatial deficit: Secondary | ICD-10-CM | POA: Diagnosis present

## 2024-02-22 DIAGNOSIS — H548 Legal blindness, as defined in USA: Secondary | ICD-10-CM | POA: Insufficient documentation

## 2024-02-25 ENCOUNTER — Inpatient Hospital Stay

## 2024-02-25 ENCOUNTER — Inpatient Hospital Stay: Attending: Hematology

## 2024-02-25 VITALS — BP 140/67 | HR 83 | Temp 98.1°F | Resp 16

## 2024-02-25 DIAGNOSIS — N1831 Chronic kidney disease, stage 3a: Secondary | ICD-10-CM | POA: Insufficient documentation

## 2024-02-25 DIAGNOSIS — D631 Anemia in chronic kidney disease: Secondary | ICD-10-CM | POA: Insufficient documentation

## 2024-02-25 DIAGNOSIS — I129 Hypertensive chronic kidney disease with stage 1 through stage 4 chronic kidney disease, or unspecified chronic kidney disease: Secondary | ICD-10-CM | POA: Diagnosis not present

## 2024-02-25 DIAGNOSIS — D649 Anemia, unspecified: Secondary | ICD-10-CM

## 2024-02-25 LAB — CBC WITH DIFFERENTIAL/PLATELET
Abs Immature Granulocytes: 0.09 10*3/uL — ABNORMAL HIGH (ref 0.00–0.07)
Basophils Absolute: 0.1 10*3/uL (ref 0.0–0.1)
Basophils Relative: 1 %
Eosinophils Absolute: 0.3 10*3/uL (ref 0.0–0.5)
Eosinophils Relative: 3 %
HCT: 33.1 % — ABNORMAL LOW (ref 36.0–46.0)
Hemoglobin: 10.1 g/dL — ABNORMAL LOW (ref 12.0–15.0)
Immature Granulocytes: 1 %
Lymphocytes Relative: 27 %
Lymphs Abs: 2.9 10*3/uL (ref 0.7–4.0)
MCH: 32 pg (ref 26.0–34.0)
MCHC: 30.5 g/dL (ref 30.0–36.0)
MCV: 104.7 fL — ABNORMAL HIGH (ref 80.0–100.0)
Monocytes Absolute: 0.9 10*3/uL (ref 0.1–1.0)
Monocytes Relative: 8 %
Neutro Abs: 6.5 10*3/uL (ref 1.7–7.7)
Neutrophils Relative %: 60 %
Platelets: 291 10*3/uL (ref 150–400)
RBC: 3.16 MIL/uL — ABNORMAL LOW (ref 3.87–5.11)
RDW: 14.9 % (ref 11.5–15.5)
WBC: 10.7 10*3/uL — ABNORMAL HIGH (ref 4.0–10.5)
nRBC: 0 % (ref 0.0–0.2)

## 2024-02-25 MED ORDER — EPOETIN ALFA-EPBX 10000 UNIT/ML IJ SOLN
10000.0000 [IU] | Freq: Once | INTRAMUSCULAR | Status: AC
  Administered 2024-02-25: 10000 [IU] via SUBCUTANEOUS
  Filled 2024-02-25: qty 1

## 2024-02-25 NOTE — Patient Instructions (Signed)
 [facility] [place] - A DEPT OF [facility]  Discharge Instructions: Thank you for choosing [facility] to provide your oncology and hematology care.  If you have a lab appointment with the Cancer Center - please note that after April 8th, 2024, all labs will be drawn in the cancer center.  You do not have to check in or register with the main entrance as you have in the past but will complete your check-in in the cancer center.  Wear comfortable clothing and clothing appropriate for easy access to any Portacath or PICC line.   We strive to give you quality time with your provider. You may need to reschedule your appointment if you arrive late (15 or more minutes).  Arriving late affects you and other patients whose appointments are after yours.  Also, if you miss three or more appointments without notifying the office, you may be dismissed from the clinic at the provider's discretion.      For prescription refill requests, have your pharmacy contact our office and allow 72 hours for refills to be completed.    Today you received the following injection Retacrit   To help prevent nausea and vomiting after your treatment, we encourage you to take your nausea medication as directed.  BELOW ARE SYMPTOMS THAT SHOULD BE REPORTED IMMEDIATELY: *FEVER GREATER THAN 100.4 F (38 C) OR HIGHER *CHILLS OR SWEATING *NAUSEA AND VOMITING THAT IS NOT CONTROLLED WITH YOUR NAUSEA MEDICATION *UNUSUAL SHORTNESS OF BREATH *UNUSUAL BRUISING OR BLEEDING *URINARY PROBLEMS (pain or burning when urinating, or frequent urination) *BOWEL PROBLEMS (unusual diarrhea, constipation, pain near the anus) TENDERNESS IN MOUTH AND THROAT WITH OR WITHOUT PRESENCE OF ULCERS (sore throat, sores in mouth, or a toothache) UNUSUAL RASH, SWELLING OR PAIN  UNUSUAL VAGINAL DISCHARGE OR ITCHING   Items with * indicate a potential emergency and should be followed up as soon as possible or go to the  Emergency Department if any problems should occur.  Please show the CHEMOTHERAPY ALERT CARD or IMMUNOTHERAPY ALERT CARD at check-in to the Emergency Department and triage nurse.  Should you have questions after your visit or need to cancel or reschedule your appointment, please contact [facility] [place] - A DEPT OF [facility] [phone]  and follow the prompts.  Office hours are 8:00 a.m. to 4:30 p.m. Monday - Friday. Please note that voicemails left after 4:00 p.m. may not be returned until the following business day.  We are closed weekends and major holidays. You have access to a nurse at all times for urgent questions. Please call the main number to the clinic [phone] and follow the prompts.  For any non-urgent questions, you may also contact your provider using MyChart. We now offer e-Visits for anyone 61 and older to request care online for non-urgent symptoms. For details visit [email or web address].   Also download the MyChart app! Go to the app store, search "MyChart", open the app, select [place], and log in with your MyChart username and password.

## 2024-02-25 NOTE — Progress Notes (Signed)
 Retacrit injection given per orders. Patient tolerated it well without problems. Vitals stable and discharged home from clinic ambulatory. Follow up as scheduled.

## 2024-03-01 ENCOUNTER — Encounter: Payer: Self-pay | Admitting: Emergency Medicine

## 2024-03-01 ENCOUNTER — Ambulatory Visit
Admission: EM | Admit: 2024-03-01 | Discharge: 2024-03-01 | Disposition: A | Discharge status: Home / Self Care | Attending: Internal Medicine | Admitting: Internal Medicine

## 2024-03-01 ENCOUNTER — Ambulatory Visit (INDEPENDENT_AMBULATORY_CARE_PROVIDER_SITE_OTHER)

## 2024-03-01 DIAGNOSIS — J189 Pneumonia, unspecified organism: Secondary | ICD-10-CM

## 2024-03-01 DIAGNOSIS — R0602 Shortness of breath: Secondary | ICD-10-CM | POA: Diagnosis not present

## 2024-03-01 DIAGNOSIS — R079 Chest pain, unspecified: Secondary | ICD-10-CM | POA: Diagnosis not present

## 2024-03-01 DIAGNOSIS — Z950 Presence of cardiac pacemaker: Secondary | ICD-10-CM | POA: Diagnosis not present

## 2024-03-01 MED ORDER — DOXYCYCLINE HYCLATE 100 MG PO CAPS: 100.0000 mg | ORAL_CAPSULE | Freq: Two times a day (BID) | ORAL | 0 refills | Status: AC

## 2024-03-01 MED ORDER — ALBUTEROL SULFATE HFA 108 (90 BASE) MCG/ACT IN AERS: 1.0000 | INHALATION_SPRAY | Freq: Four times a day (QID) | RESPIRATORY_TRACT | 0 refills | Status: AC | PRN

## 2024-03-01 MED ORDER — CEFPODOXIME PROXETIL 200 MG PO TABS: 200.0000 mg | ORAL_TABLET | Freq: Two times a day (BID) | ORAL | 0 refills | Status: AC

## 2024-03-01 NOTE — ED Provider Notes (Signed)
 [facility]    CSN: [id number] Arrival date & time: 03/01/24  1443      History   Chief Complaint No chief complaint on file.   HPI Katherine Larson is a 78 y.o. female.   Katherine Larson is a 78 y.o. female presenting for chief complaint of pain to the right lower ribcage and shortness of breath that started this morning upon waking. She states she "feels like she has been punched in the rib" on the right side. She has felt short of breath with exertion today and has developed productive cough with yellow sputum. Symptoms are similar to pain and shortness of breath experienced when she was hospitalized with multifocal pneumonia last year in March 2024. Denies recent trauma/injury to the chest wall, recent antibiotics in the last 90 days, flank pain, urinary symptoms, dizziness, chest pain, leg swelling, new orthopnea. History of CHF, compliant with meds. History of asthma. Former cigarette smoker, quit 30 years ago. She has been using her albuterol inhaler for shortness of breath with some relief.      Past Medical History:  Diagnosis Date   Anemia    anemia of chronic disease +/- IDA followed by Dr. Liam Redhead. received iron infusions in the past   Asthma    Borderline glaucoma    CAD (coronary artery disease)    a. s/p CABG 1996. b. low risk nuc 2011. c. LHC 04/2016 due to drop in EF -> occluded native LAD,  widely patent sequential LIMA-D1-LAD.   Chronic kidney disease    "mild stage of kidney disease"   Chronic systolic CHF (congestive heart failure) (HCC)    Complication of anesthesia    DM2 (diabetes mellitus, type 2) (HCC)    not on any medicine for this at this time, 05/2016   Dyspnea    with exertion   GERD (gastroesophageal reflux disease)    Gout    History of blood transfusion    History of hiatal hernia    in 20s   History of pneumonia    March, 2016   HLD (hyperlipidemia)    HTN (hypertension)    Hyperthyroidism 01/21/2016   Inappropriate sinus  tachycardia (HCC)    LBBB (left bundle branch block)    a. 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 Dx 04/2016 - EF 25-30%, diffuse HK, elevated LVEDP, mild MR, mod LAE.   Normocytic anemia 10/17/2021   NSVT (nonsustained ventricular tachycardia) (HCC)    PAT (paroxysmal atrial tachycardia) (HCC)    Pneumonia    PONV (postoperative nausea and vomiting)    "after just about every surgery I've had"   Rheumatoid arthritis (HCC)    RA- hands   Seasonal allergies     Patient Active Problem List   Diagnosis Date Noted   Bacteremia 02/01/2023   Pseudomonas sepsis (HCC) 01/30/2023   Acute respiratory failure with hypoxia (HCC) 01/28/2023   Multifocal pneumonia 01/28/2023   Melena 11/28/2022   Rectal bleeding 11/28/2022   Upper abdominal pain 11/28/2022   Early satiety 07/27/2022   Nausea without vomiting 07/27/2022   Hypotension 02/10/2022   Normocytic anemia 10/17/2021   Lymphocytic colitis 07/02/2020   Coronary artery disease, occlusive: CTO proxLAD 03/26/2020   Abnormal nuclear stress test 03/26/2020   Chronic diarrhea 03/02/2020  NICM- new drop in EF 25-30% 05/03/2016   Acute pulmonary edema (HCC)    LBBB (left bundle branch block) 04/29/2016   Chronic HFrEF (heart failure with reduced ejection fraction) (HCC) 04/29/2016   Abdominal pain, chronic, epigastric 03/07/2016   Septic shock (HCC)    COPD exacerbation (HCC) 01/21/2016   Hyperthyroidism 01/21/2016   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   Hypokalemia 01/19/2016   Hyponatremia 01/19/2016   Diet-controlled diabetes mellitus (HCC) 01/19/2016   CAP (community acquired pneumonia) 01/18/2016   Mucosal abnormality of stomach    Dysphagia    Constipation 12/08/2015   Gastroesophageal reflux disease 12/08/2015   Abnormal CT scan, esophagus 12/08/2015   Esophageal dysphagia  12/08/2015   Abdominal pain, epigastric 12/08/2015   Loss of weight 12/08/2015   Lower abdominal pain 12/08/2015   Back pain 08/26/2015   Headache 08/26/2015   Essential hypertension 08/26/2015   DM type 2 (diabetes mellitus, type 2) (HCC) 08/26/2015   Sinus tachycardia 06/07/2015   Diarrhea 06/07/2015   Preop cardiovascular exam 07/16/2013   Atrial flutter (HCC) 07/16/2013   Anemia 01/08/2013   Leukocytosis 12/17/2012   DYSPNEA 02/02/2010   DIABETES MELLITUS 07/28/2009   Hyperlipidemia 07/28/2009   CHOLECYSTECTOMY, HX OF 07/28/2009   Hx of CABG '96- cath 05/01/16 07/28/2009   HERNIORRHAPHY, HX OF 07/28/2009    Past Surgical History:  Procedure Laterality Date   ABDOMINAL HYSTERECTOMY     APPENDECTOMY     BACK SURGERY     BIOPSY  04/27/2020   Procedure: BIOPSY;  Surgeon: Suzette Espy, MD;  Location: [facility];  Service: Endoscopy;;  ascending colon biopsy   CARDIAC CATHETERIZATION N/A 05/01/2016   Procedure: Right/Left Heart Cath and Coronary/Graft Angiography;  Surgeon: Arleen Lacer, MD;  Location: [facility] INVASIVE CV LAB;  Service: Cardiovascular;  Laterality: N/A;   CHOLECYSTECTOMY     COLONOSCOPY  11/2011   Dr. Magod:ext/int hemorrhoids, diverticulosis sigmoid colon and distal desc colon, mid-desc colon hyperplastic polyps.    COLONOSCOPY WITH PROPOFOL N/A 04/27/2020   Rourk: Diverticulosis, hemorrhoids, normal terminal ileum.  Random colon biopsies significant for chronic lymphocytic colitis. No repeat due to age.   CORONARY ARTERY BYPASS GRAFT  1996   2 vessels   ESOPHAGOGASTRODUODENOSCOPY  11/2011   Dr. Lavaughn [place]: small hiatal hernia, one non-bleeding superficial gastric ulcer, medium-sized diverticulum in area of papilla   ESOPHAGOGASTRODUODENOSCOPY N/A 12/29/2015   Dr. Riley Cheadle: Chronic inactive gastritis, normal esophagus status post dilation, duodenal diverticula   ESOPHAGOGASTRODUODENOSCOPY (EGD) WITH PROPOFOL N/A 07/24/2019   Dr. Riley Cheadle: Normal esophagus status  post empiric dilation, small hiatal hernia, large D2 diverticulum   ESOPHAGOGASTRODUODENOSCOPY (EGD) WITH PROPOFOL N/A 06/03/2021   Surgeon: Suzette Espy, MD; normal esophagus, normal stomach, normal duodenum.  Abnormal hypopharyngeal mucosa bilaterally just distal to the base of the tongue (right side greater than left), overlying mucosa appeared irregular and somewhat verrucous in appearance.  Recommended ENT evaluation.   EYE SURGERY Bilateral    cataract removal   FOOT SURGERY     removal bone spur   FRACTURE SURGERY Right    arm   GIVENS CAPSULE STUDY  11/2012   Dr. Lavaughn [place]: minimal gastritis, normal small bowel capsule   HERNIA REPAIR     hiatal hernia   ICD IMPLANT N/A 02/04/2021   Procedure: ICD IMPLANT;  Surgeon: Tammie Fall, MD;  Location: [facility] INVASIVE CV LAB;  Service: Cardiovascular;  Laterality: N/A;   MALONEY DILATION N/A 07/24/2019   Procedure: Londa Rival DILATION;  Surgeon: Suzette Espy,  MD;  Location: [facility];  Service: Endoscopy;  Laterality: N/A;   MALONEY DILATION N/A 06/03/2021   Procedure: Londa Rival DILATION;  Surgeon: Suzette Espy, MD;  Location: [facility];  Service: Endoscopy;  Laterality: N/A;   MAXIMUM ACCESS (MAS)POSTERIOR LUMBAR INTERBODY FUSION (PLIF) 1 LEVEL N/A 08/14/2013   Procedure:  MAXIMUM ACCESS SURGERY(MAS) POSTERIOR LUMBAR INTERBODY FUSION LUMBAR THREE-FOUR ;  Surgeon: Isadora Mar, MD;  Location: [facility];  Service: Neurosurgery;  Laterality: N/A;   MAXIMUM ACCESS SURGERY(MAS) POSTERIOR LUMBAR INTERBODY FUSION LUMBAR THREE-FOUR    PTCA     RIGHT/LEFT HEART CATH AND CORONARY ANGIOGRAPHY N/A 03/26/2020   Procedure: RIGHT/LEFT HEART CATH AND CORONARY ANGIOGRAPHY;  Surgeon: Arleen Lacer, MD;  Location: [facility] INVASIVE CV LAB;  Service: Cardiovascular;  Laterality: N/A;   SPINAL CORD STIMULATOR BATTERY EXCHANGE N/A 01/19/2021   Procedure: Spinal cord stimulator battery change;  Surgeon: Annis Kinder, MD;  Location: [facility] OR;  Service:  Neurosurgery;  Laterality: N/A;   SPINAL CORD STIMULATOR INSERTION N/A 06/16/2016   Procedure: LUMBAR SPINAL CORD STIMULATOR INSERTION;  Surgeon: Gerri Kras, MD;  Location: [facility];  Service: Neurosurgery;  Laterality: N/A;  LUMBAR SPINAL CORD STIMULATOR INSERTION   TEE WITHOUT CARDIOVERSION N/A 02/01/2023   Procedure: TRANSESOPHAGEAL ECHOCARDIOGRAM (TEE);  Surgeon: Gerard Knight, MD;  Location: [facility];  Service: Cardiovascular;  Laterality: N/A;   tens  05/2016   TONSILLECTOMY      OB History   No obstetric history on file.      Home Medications    Prior to Admission medications   Medication Sig Start Date End Date Taking? Authorizing Provider  albuterol (VENTOLIN HFA) 108 (90 Base) MCG/ACT inhaler Inhale 1-2 puffs into the lungs every 6 (six) hours as needed for wheezing or shortness of breath. 03/01/24  Yes Starlene Eaton, FNP  cefpodoxime (VANTIN) 200 MG tablet Take 1 tablet (200 mg total) by mouth 2 (two) times daily for 7 days. 03/01/24 03/08/24 Yes Karen Kinnard, Danny Dye, FNP  doxycycline (VIBRAMYCIN) 100 MG capsule Take 1 capsule (100 mg total) by mouth 2 (two) times daily for 7 days. 03/01/24 03/08/24 Yes StanhopeDanny Dye, FNP  allopurinol (ZYLOPRIM) 300 MG tablet Take 300 mg by mouth daily.    [provider]  aspirin EC 81 MG EC tablet Take 1 tablet (81 mg total) by mouth daily. 05/03/16   Kilroy, Luke K, PA-C  atorvastatin (LIPITOR) 80 MG tablet Take 80 mg by mouth daily.    [provider]  carvedilol (COREG) 6.25 MG tablet Take 1.5 tablets (9.375 mg total) by mouth 2 (two) times daily with a meal. Patient takes 1.5 tablet by mouth twice daily. 01/22/24   Darlis Eisenmenger, MD  cholecalciferol (VITAMIN D3) 25 MCG (1000 UT) tablet Take 1,000 Units by mouth daily.    [provider]  dapagliflozin propanediol (FARXIGA) 10 MG TABS tablet TAKE 1 TABLET(10 MG) BY MOUTH DAILY BEFORE BREAKFAST 05/09/23   Darlis Eisenmenger, MD  etanercept (ENBREL  SURECLICK) 50 MG/ML injection Inject 50 mg into the skin once a week. For 30 days 07/17/22   [provider]  ferrous sulfate 325 (65 FE) MG tablet Take 325 mg by mouth daily with breakfast. Every other day    [provider]  fexofenadine (ALLEGRA) 180 MG tablet Take 180 mg by mouth daily.    [provider]  fluticasone (FLONASE) 50 MCG/ACT nasal spray Place 1 spray into both nostrils 2 (two)  times daily. 04/18/23   [provider]  furosemide (LASIX) 40 MG tablet Take 40 mg by mouth daily.    [provider]  gabapentin (NEURONTIN) 300 MG capsule Take 300 mg by mouth 3 (three) times daily.    [provider]  lansoprazole (PREVACID) 30 MG capsule TAKE 1 CAPSULE BY MOUTH 1 TO 2 TIMES DAILY BEFORE A MEAL 06/25/23   Lanney Pitts, PA-C  leflunomide (ARAVA) 20 MG tablet Take 1 tablet (20 mg total) by mouth daily. 02/13/23   Justina Oman, MD  linaclotide [facility]) 72 MCG capsule Take 1 capsule (72 mcg total) by mouth daily before breakfast. As needed for constipation 04/03/23   Lanney Pitts, PA-C  losartan (COZAAR) 50 MG tablet Take 1 tablet (50 mg total) by mouth daily. Patient taking differently: Take 50 mg by mouth daily. 06/13/23   Elmarie Hacking, FNP  MAGNESIUM-OXIDE 400 (240 Mg) MG tablet TAKE 1 TABLET(400 MG) BY MOUTH DAILY 12/12/21   Nishan, Peter C, MD  meclizine (ANTIVERT) 12.5 MG tablet Take 12.5 mg by mouth 3 (three) times daily as needed for dizziness.    [provider]  montelukast (SINGULAIR) 10 MG tablet Take 10 mg by mouth at bedtime.    [provider]  Multiple Vitamins-Minerals (OCUVITE ADULT 50+ PO) Take 1 tablet by mouth daily.    [provider]  nitroGLYCERIN (NITROSTAT) 0.4 MG SL tablet DISSOLVE 1 TABLET UNDER THE  TONGUE EVERY 5 MINUTES AS NEEDED FOR CHEST PAIN. MAX OF 3 TABLETS IN 15 MINUTES. CALL 911 IF PAIN  PERSISTS. 01/10/22   Tammie Fall, MD  oxyCODONE-acetaminophen (PERCOCET) 10-325  MG tablet Take 1 tablet by mouth 4 (four) times daily as needed.    [provider]  spironolactone (ALDACTONE) 25 MG tablet Take 1 tablet (25 mg total) by mouth daily. 06/13/23   Elmarie Hacking, FNP  TRELEGY Marylouise Socks 200-62.5-25 MCG/ACT AEPB Inhale 1 puff into the lungs daily. 09/16/21   [provider]  triamcinolone cream (KENALOG) 0.1 % Apply topically as needed. 04/27/16   [provider]  VASCEPA 1 g capsule TAKE 1 CAPSULE(1 GRAM) BY MOUTH TWICE DAILY 02/18/24   Darlis Eisenmenger, MD    Family History Family History  Problem Relation Age of Onset   Heart attack Father    Heart attack Mother    Bladder Cancer Brother    Cervical cancer Daughter        ?   Colon cancer Neg Hx     Social History Social History   Tobacco Use   Smoking status: Former    Current packs/day: 0.00    Average packs/day: 0.8 packs/day for 15.0 years (11.3 ttl pk-yrs)    Types: Cigarettes    Start date: 11/21/1979    Quit date: 11/20/1994    Years since quitting: 29.2   Smokeless tobacco: Never   Tobacco comments:    Quit in 1996  Vaping Use   Vaping status: Never Used  Substance Use Topics   Alcohol use: Never    Alcohol/week: 0.0 standard drinks of alcohol   Drug use: Never     Allergies   Biaxin [clarithromycin], Penicillins, Hydroxychloroquine, Sulfamethoxazole, Sulfasalazine, and Losartan potassium   Review of Systems Review of Systems Per HPI  Physical Exam Triage Vital Signs ED Triage Vitals  Encounter Vitals Group     BP 03/01/24 1450 (!) 132/58     Systolic BP Percentile --      Diastolic BP  Percentile --      Pulse Rate 03/01/24 1450 82     Resp 03/01/24 1450 16     Temp 03/01/24 1450 97.9 F (36.6 C)     Temp Source 03/01/24 1450 Oral     SpO2 03/01/24 1450 94 %     Weight --      Height --      Head Circumference --      Peak Flow --      Pain Score 03/01/24 1451 4     Pain Loc --      Pain Education --      Exclude from Growth Chart --     No data found.  Updated Vital Signs BP (!) 132/58 (BP Location: Right Arm)   Pulse 82   Temp 97.9 F (36.6 C) (Oral)   Resp 16   SpO2 94%   Visual Acuity Right Eye Distance:   Left Eye Distance:   Bilateral Distance:    Right Eye Near:   Left Eye Near:    Bilateral Near:     Physical Exam Vitals and nursing note reviewed.  Constitutional:      Appearance: She is not ill-appearing or toxic-appearing.  HENT:     Head: Normocephalic and atraumatic.     Right Ear: Hearing, tympanic membrane, ear canal and external ear normal.     Left Ear: Hearing, tympanic membrane, ear canal and external ear normal.     Nose: Nose normal.     Mouth/Throat:     Lips: Pink.     Mouth: Mucous membranes are moist. No injury or oral lesions.     Dentition: Normal dentition.     Tongue: No lesions.     Pharynx: Oropharynx is clear. Uvula midline. No pharyngeal swelling, oropharyngeal exudate, posterior oropharyngeal erythema, uvula swelling or postnasal drip.     Tonsils: No tonsillar exudate.  Eyes:     General: Lids are normal. Vision grossly intact. Gaze aligned appropriately.     Extraocular Movements: Extraocular movements intact.     Conjunctiva/sclera: Conjunctivae normal.  Neck:     Trachea: Trachea and phonation normal.  Cardiovascular:     Rate and Rhythm: Normal rate and regular rhythm.     Heart sounds: Normal heart sounds, S1 normal and S2 normal.  Pulmonary:     Effort: Pulmonary effort is normal. No respiratory distress.     Breath sounds: Normal air entry. Rhonchi and rales present. No wheezing.     Comments: Localized course rales and rhonchi heard to the right lower lung field. Speaking in full sentences without difficulty. No abnormal lung sounds otherwise. Chest:     Chest wall: No tenderness.  Musculoskeletal:     Cervical back: Neck supple.     Right lower leg: No edema.     Left lower leg: No edema.  Lymphadenopathy:     Cervical: No cervical adenopathy.   Skin:    General: Skin is warm and dry.     Capillary Refill: Capillary refill takes less than 2 seconds.     Findings: No rash.  Neurological:     General: No focal deficit present.     Mental Status: She is alert and oriented to person, place, and time. Mental status is at baseline.     Cranial Nerves: No dysarthria or facial asymmetry.  Psychiatric:        Mood and Affect: Mood normal.        Speech: Speech normal.  Behavior: Behavior normal.        Thought Content: Thought content normal.        Judgment: Judgment normal.      UC Treatments / Results  Labs (all labs ordered are listed, but only abnormal results are displayed) Labs Reviewed - No data to display  EKG   Radiology   Procedures Procedures (including critical care time)  Medications Ordered in UC Medications - No data to display  Initial Impression / Assessment and Plan / UC Course  I have reviewed the triage vital signs and the nursing notes.  Pertinent labs & imaging results that were available during my care of the patient were reviewed by me and considered in my medical decision making (see chart for details).   1. Community acquired pneumonia of right lower lobe, shortness of breath High clinical suspicion for right lower lobe CAP based on exam findings and chest x-ray findings. I am able to visualize a small patchy opacity to the right lower lung suspicious for pneumonia.  Staff will call if radiology re-read shows abnormality requiring change in plan. Low suspicion for asthma/CHF exacerbation.  Antibiotics ordered. She is a candidate for outpatient treatment per CURB-65, vital signs stable, no new oxygen requirement. Recommend close follow-up with PCP in the next 3-4 days for ongoing evaluation and re-check.  Albuterol inhaler as needed for shortness of breath.  Counseled patient on potential for adverse effects with medications prescribed/recommended today, strict ER and return-to-clinic  precautions discussed, patient verbalized understanding.    Final Clinical Impressions(s) / UC Diagnoses   Final diagnoses:  Shortness of breath  Community acquired pneumonia of right lower lobe of lung     Discharge Instructions      Your chest x-ray is suspicious for pneumonia.  Take antibiotics every 12 hours for the next 7 days.  You may use albuterol inhaler 2 puffs every 4-6 hours as needed for shortness of breath and wheezing.  Please schedule an appointment for follow-up with your primary care provider in the next 2 to 3 days for recheck.  If you become more short of breath, develop fever despite antibiotic use, or start having chest pain or leg swelling, please go to the nearest emergency department for further evaluation. Otherwise follow-up with PCP. I hope you feel better!     ED Prescriptions     Medication Sig Dispense Auth. Provider   cefpodoxime (VANTIN) 200 MG tablet Take 1 tablet (200 mg total) by mouth 2 (two) times daily for 7 days. 14 tablet Starlene Eaton, FNP   doxycycline (VIBRAMYCIN) 100 MG capsule Take 1 capsule (100 mg total) by mouth 2 (two) times daily for 7 days. 14 capsule Starlene Eaton, FNP   albuterol (VENTOLIN HFA) 108 (90 Base) MCG/ACT inhaler Inhale 1-2 puffs into the lungs every 6 (six) hours as needed for wheezing or shortness of breath. 18 g Starlene Eaton, FNP      PDMP not reviewed this encounter.   Starlene Eaton, [place] 03/01/24 1731

## 2024-03-01 NOTE — ED Triage Notes (Signed)
 Sharp pain on right side of back and SOB that started upon waking this morning.

## 2024-03-01 NOTE — Discharge Instructions (Signed)
 Your chest x-ray is suspicious for pneumonia.  Take antibiotics every 12 hours for the next 7 days.  You may use albuterol inhaler 2 puffs every 4-6 hours as needed for shortness of breath and wheezing.  Please schedule an appointment for follow-up with your primary care provider in the next 2 to 3 days for recheck.  If you become more short of breath, develop fever despite antibiotic use, or start having chest pain or leg swelling, please go to the nearest emergency department for further evaluation. Otherwise follow-up with PCP. I hope you feel better!

## 2024-03-04 DIAGNOSIS — N183 Chronic kidney disease, stage 3 unspecified: Secondary | ICD-10-CM | POA: Diagnosis not present

## 2024-03-05 ENCOUNTER — Encounter: Payer: Self-pay | Admitting: Gastroenterology

## 2024-03-05 NOTE — Progress Notes (Signed)
 Remote ICD transmission.

## 2024-03-06 ENCOUNTER — Ambulatory Visit: Admitting: Occupational Therapy

## 2024-03-06 DIAGNOSIS — R41842 Visuospatial deficit: Secondary | ICD-10-CM

## 2024-03-06 DIAGNOSIS — H548 Legal blindness, as defined in USA: Secondary | ICD-10-CM | POA: Diagnosis not present

## 2024-03-06 NOTE — Therapy (Signed)
 OUTPATIENT OCCUPATIONAL THERAPY LOW VISION TREATMENT  Patient Name: Katherine Larson MRN: [id number] DOB:17-May-1946, 78 y.o., female Today's Date: 03/06/2024  PCP: Victorio Grave, MD  REFERRING PROVIDER: [facility] - Dr. Carloyn Chi  END OF SESSION:  OT End of Session - 03/06/24 1242     Visit Number 2    Number of Visits 7    Date for OT Re-Evaluation 05/16/24    Authorization Type [employer] - auth required    OT Start Time 1236    OT Stop Time 1315    OT Time Calculation (min) 39 min    Activity Tolerance Patient tolerated treatment well    Behavior During Therapy WFL for tasks assessed/performed            Past Medical History:  Diagnosis Date   Anemia    anemia of chronic disease +/- IDA followed by Dr. Liam Redhead. received iron  infusions in the past   Asthma    Borderline glaucoma    CAD (coronary artery disease)    a. s/p CABG 1996. b. low risk nuc 2011. c. LHC 04/2016 due to drop in EF -> occluded native LAD,  widely patent sequential LIMA-D1-LAD.   Chronic kidney disease    "mild stage of kidney disease"   Chronic systolic CHF (congestive heart failure) (HCC)    Complication of anesthesia    DM2 (diabetes mellitus, type 2) (HCC)    not on any medicine for this at this time, 05/2016   Dyspnea    with exertion   GERD (gastroesophageal reflux disease)    Gout    History of blood transfusion    History of hiatal hernia    in 20s   History of pneumonia    March, 2016   HLD (hyperlipidemia)    HTN (hypertension)    Hyperthyroidism 01/21/2016   Inappropriate sinus tachycardia (HCC)    LBBB (left bundle branch block)    a. Seen in 04/2016   Lymphocytic colitis 04/2020   Macular degeneration    left   Myocardial infarction [facility])    1996   Neuropathy    Neuropathy    NICM (nonischemic cardiomyopathy) (HCC)    a. Dx 04/2016 - EF 25-30%, diffuse HK, elevated LVEDP, mild MR, mod LAE.   Normocytic anemia 10/17/2021   NSVT (nonsustained ventricular  tachycardia) (HCC)    PAT (paroxysmal atrial tachycardia) (HCC)    Pneumonia    PONV (postoperative nausea and vomiting)    "after just about every surgery I've had"   Rheumatoid arthritis (HCC)    RA- hands   Seasonal allergies    Past Surgical History:  Procedure Laterality Date   ABDOMINAL HYSTERECTOMY     APPENDECTOMY     BACK SURGERY     BIOPSY  04/27/2020   Procedure: BIOPSY;  Surgeon: Suzette Espy, MD;  Location: [facility];  Service: Endoscopy;;  ascending colon biopsy   CARDIAC CATHETERIZATION N/A 05/01/2016   Procedure: Right/Left Heart Cath and Coronary/Graft Angiography;  Surgeon: Arleen Lacer, MD;  Location: [facility] INVASIVE CV LAB;  Service: Cardiovascular;  Laterality: N/A;   CHOLECYSTECTOMY     COLONOSCOPY  11/2011   Dr. Magod:ext/int hemorrhoids, diverticulosis sigmoid colon and distal desc colon, mid-desc colon hyperplastic polyps.    COLONOSCOPY WITH PROPOFOL  N/A 04/27/2020   Rourk: Diverticulosis, hemorrhoids, normal terminal ileum.  Random colon biopsies significant for chronic lymphocytic colitis. No repeat due to age.   CORONARY ARTERY BYPASS GRAFT  1996   2  vessels   ESOPHAGOGASTRODUODENOSCOPY  11/2011   Dr. Lavaughn [place]: small hiatal hernia, one non-bleeding superficial gastric ulcer, medium-sized diverticulum in area of papilla   ESOPHAGOGASTRODUODENOSCOPY N/A 12/29/2015   Dr. Riley Cheadle: Chronic inactive gastritis, normal esophagus status post dilation, duodenal diverticula   ESOPHAGOGASTRODUODENOSCOPY (EGD) WITH PROPOFOL  N/A 07/24/2019   Dr. Riley Cheadle: Normal esophagus status post empiric dilation, small hiatal hernia, large D2 diverticulum   ESOPHAGOGASTRODUODENOSCOPY (EGD) WITH PROPOFOL  N/A 06/03/2021   Surgeon: Suzette Espy, MD; normal esophagus, normal stomach, normal duodenum.  Abnormal hypopharyngeal mucosa bilaterally just distal to the base of the tongue (right side greater than left), overlying mucosa appeared irregular and somewhat verrucous in appearance.   Recommended ENT evaluation.   EYE SURGERY Bilateral    cataract removal   FOOT SURGERY     removal bone spur   FRACTURE SURGERY Right    arm   GIVENS CAPSULE STUDY  11/2012   Dr. Lavaughn [place]: minimal gastritis, normal small bowel capsule   HERNIA REPAIR     hiatal hernia   ICD IMPLANT N/A 02/04/2021   Procedure: ICD IMPLANT;  Surgeon: Tammie Fall, MD;  Location: [facility] INVASIVE CV LAB;  Service: Cardiovascular;  Laterality: N/A;   MALONEY DILATION N/A 07/24/2019   Procedure: Londa Rival DILATION;  Surgeon: Suzette Espy, MD;  Location: [facility];  Service: Endoscopy;  Laterality: N/A;   MALONEY DILATION N/A 06/03/2021   Procedure: Londa Rival DILATION;  Surgeon: Suzette Espy, MD;  Location: [facility];  Service: Endoscopy;  Laterality: N/A;   MAXIMUM ACCESS (MAS)POSTERIOR LUMBAR INTERBODY FUSION (PLIF) 1 LEVEL N/A 08/14/2013   Procedure:  MAXIMUM ACCESS SURGERY(MAS) POSTERIOR LUMBAR INTERBODY FUSION LUMBAR THREE-FOUR ;  Surgeon: Isadora Mar, MD;  Location: [facility];  Service: Neurosurgery;  Laterality: N/A;   MAXIMUM ACCESS SURGERY(MAS) POSTERIOR LUMBAR INTERBODY FUSION LUMBAR THREE-FOUR    PTCA     RIGHT/LEFT HEART CATH AND CORONARY ANGIOGRAPHY N/A 03/26/2020   Procedure: RIGHT/LEFT HEART CATH AND CORONARY ANGIOGRAPHY;  Surgeon: Arleen Lacer, MD;  Location: [facility] INVASIVE CV LAB;  Service: Cardiovascular;  Laterality: N/A;   SPINAL CORD STIMULATOR BATTERY EXCHANGE N/A 01/19/2021   Procedure: Spinal cord stimulator battery change;  Surgeon: Annis Kinder, MD;  [facility] OR;  Service: Neurosurgery;  Laterality: N/A;   SPINAL CORD STIMULATOR INSERTION N/A 06/16/2016   Procedure: LUMBAR SPINAL CORD STIMULATOR INSERTION;  Surgeon: Gerri Kras, MD;  Location: [facility];  Service: Neurosurgery;  Laterality: N/A;  LUMBAR SPINAL CORD STIMULATOR INSERTION   TEE WITHOUT CARDIOVERSION N/A 02/01/2023   Procedure: TRANSESOPHAGEAL ECHOCARDIOGRAM (TEE);  Surgeon: Gerard Knight, MD;   Location: [facility] OR[facility];  Service: Cardiovascular;  Laterality: N/A;   tens  05/2016   TONSILLECTOMY     Patient Active Problem List   Diagnosis Date Noted   Bacteremia 02/01/2023   Pseudomonas sepsis (HCC) 01/30/2023   Acute respiratory failure with hypoxia (HCC) 01/28/2023   Multifocal pneumonia 01/28/2023   Melena 11/28/2022   Rectal bleeding 11/28/2022   Upper abdominal pain 11/28/2022   Early satiety 07/27/2022   Nausea without vomiting 07/27/2022   Hypotension 02/10/2022   Normocytic anemia 10/17/2021   Lymphocytic colitis 07/02/2020   Coronary artery disease, occlusive: CTO proxLAD 03/26/2020   Abnormal nuclear stress test 03/26/2020   Chronic diarrhea 03/02/2020   NICM- new drop in EF 25-30% 05/03/2016   Acute pulmonary edema (HCC)    LBBB (left bundle branch block) 04/29/2016   Chronic HFrEF (heart failure with reduced ejection  fraction) (HCC) 04/29/2016   Abdominal pain, chronic, epigastric 03/07/2016   Septic shock (HCC)    COPD exacerbation (HCC) 01/21/2016   Hyperthyroidism 01/21/2016   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   Hypokalemia 01/19/2016   Hyponatremia 01/19/2016   Diet-controlled diabetes mellitus (HCC) 01/19/2016   CAP (community acquired pneumonia) 01/18/2016   Mucosal abnormality of stomach    Dysphagia    Constipation 12/08/2015   Gastroesophageal reflux disease 12/08/2015   Abnormal CT scan, esophagus 12/08/2015   Esophageal dysphagia 12/08/2015   Abdominal pain, epigastric 12/08/2015   Loss of weight 12/08/2015   Lower abdominal pain 12/08/2015   Back pain 08/26/2015   Headache 08/26/2015   Essential hypertension 08/26/2015   DM type 2 (diabetes mellitus, type 2) (HCC) 08/26/2015   Sinus tachycardia 06/07/2015   Diarrhea 06/07/2015   Preop cardiovascular exam 07/16/2013   Atrial flutter (HCC) 07/16/2013   Anemia 01/08/2013   Leukocytosis 12/17/2012   DYSPNEA 02/02/2010   DIABETES MELLITUS 07/28/2009   Hyperlipidemia 07/28/2009    CHOLECYSTECTOMY, HX OF 07/28/2009   Hx of CABG '96- cath 05/01/16 07/28/2009   HERNIORRHAPHY, HX OF 07/28/2009    ONSET DATE: 12/18/2023 (Date of referral) - reports significant decline in vision over the last couple years  REFERRING DIAG: H35.30 (ICD-10-CM) - Macular degeneration   THERAPY DIAG:  Visuospatial deficit  Legally blind  Rationale for Evaluation and Treatment: Rehabilitation  SUBJECTIVE:   SUBJECTIVE STATEMENT: "I want to do what I can do."  She has PNA and is on medications. She was able to get in touch with services for the blind and is awaiting a time for them to meet up to discuss options that can work for her.   Pt accompanied by: self and husband stayed in the lobby    PERTINENT HISTORY: PMH: DM II, CKD, CAD, hyperthyroidism, borderline glaucoma, AMD, spinal cord stimulator, RA, neuropathy in feet, MI (1996), CHF, HLD, HTN  PAIN:  Are you having pain? Yes: NPRS scale: 5/10 Pain location: R knee and back Pain description: excruciating; throbs Aggravating factors: getting up Relieving factors: positioning  FALLS: Has patient fallen in last 6 months? 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; retired Garment/textile technologist  PATIENT GOALS: be as independent as possible/do as much as she can  OBJECTIVE:   GLASSES: N/A  CURRENT MODIFICATIONS/ADAPTIONS: uses flashlight to see help read items  DEVICES: Husband reports 20 and 30 x magnifiers but pt unable to use these to read anymore  PHONE: does not know how to use; husband reports their daughter is in the process of getting the pt an iPhone  HAND DOMINANCE: Right  ADLs IADLs: Overall ADLs: mostly mod I with accommodations   Meal Prep: dependent  Handwriting: Not legible and  unable  MOBILITY STATUS: Needs Assist: min A for sit to stand and min A to navigate using RW  FUNCTIONAL OUTCOME MEASURES: AM-PAC 6 Clicks for ADLs 1. Putting on and taking off regular lower body clothing?  A little  2. Bathing (including washing, rinsing, drying)? A little 3. Toileting, which includes using toilet, bedpan, or urinal?  A little 4. Putting on and taking off regular upper body clothing?  A little 5. Personal grooming such as brushing teeth?  A little 6. Eating meals?  A little  RAW Score: 18/24 Functional Impairment: 47%  PSFS TBD at next visit  COGNITION: Overall cognitive status: Within functional limits for tasks assessed  VISUAL FINDINGS: Baseline vision: Legally blind Visual history: glaucoma and macular degeneration  Impaired Reading acuity: 20/320 - distance acuity per referral: CF @ 2' OU  OBSERVATIONS: Pt appears well-kept. Ambulates with use of RW and min to CGA, altered gait, but no LOB. No glasses donned.   TODAY'S TREATMENT:                                                                                                                               OT reviewed [facility] Blind/Books on Tape and bible options. Pt to follow-up with this.  OT trialed options for phone for easier location of frequently used apps, reading of text messages, and general contrast/text inversion.    PATIENT EDUCATION: Education details: general phone recommendations Person educated: Patient Education method: Explanation  Education comprehension: verbalized understanding and needs further education  HOME EXERCISE PROGRAM: Not yet initiated   GOALS:  SHORT TERM GOALS: Target date: 04/03/2024    Patient will independently recall at least 2 compensatory strategies for visual impairment without cueing. Baseline: Goal status: INITIAL  2.  Patient will return demonstration of visual adaptation use and verbalize understanding of how to obtain item(s) if  desired.  Baseline:  Goal status: INITIAL   LONG TERM GOALS: Target date: 05/16/24   Will set appropriate PSFS goal.  Baseline:  Goal status: INITIAL  ASSESSMENT:  CLINICAL IMPRESSION: Patient very limited with phone use. This would be helped if pt to get a phone with more accessibility features though will need additional instructions with phone use regardless.   PERFORMANCE DEFICITS: in functional skills including ADLs, IADLs, pain, mobility, endurance, decreased knowledge of precautions, decreased knowledge of use of DME, and vision.   IMPAIRMENTS: are limiting patient from ADLs, IADLs, leisure, and social participation.   CO-MORBIDITIES: has co-morbidities such as CHF, CAD, HTN, DMII, and chronic pain  that affects occupational performance. Patient will benefit from skilled OT to address above impairments and improve overall function.  REHAB POTENTIAL: Fair for goals stated  PLAN:  OT FREQUENCY: every other week  OT DURATION:  6 sessions  PLANNED INTERVENTIONS: self care/ADL training, therapeutic exercise, therapeutic activity, functional mobility training, patient/family education, visual/perceptual remediation/compensation, coping strategies training, DME and/or AE instructions, and Re-evaluation  RECOMMENDED OTHER SERVICES: Services for the blind  CONSULTED AND AGREED WITH PLAN OF CARE: Patient and husband  PLAN FOR NEXT SESSION: PSFS - update goal; Go over low vision packet; [facility] status; iPhone status/modifications to current phone from last session   Altamease Asters, OT 03/06/2024, 12:44 PM

## 2024-03-10 ENCOUNTER — Ambulatory Visit: Attending: Internal Medicine

## 2024-03-10 DIAGNOSIS — I5022 Chronic systolic (congestive) heart failure: Secondary | ICD-10-CM | POA: Diagnosis not present

## 2024-03-10 DIAGNOSIS — Z95810 Presence of automatic (implantable) cardiac defibrillator: Secondary | ICD-10-CM

## 2024-03-11 ENCOUNTER — Telehealth: Payer: Self-pay | Admitting: Cardiology

## 2024-03-11 ENCOUNTER — Telehealth: Payer: Self-pay

## 2024-03-11 DIAGNOSIS — M47812 Spondylosis without myelopathy or radiculopathy, cervical region: Secondary | ICD-10-CM | POA: Diagnosis not present

## 2024-03-11 DIAGNOSIS — M1711 Unilateral primary osteoarthritis, right knee: Secondary | ICD-10-CM | POA: Diagnosis not present

## 2024-03-11 DIAGNOSIS — Z9689 Presence of other specified functional implants: Secondary | ICD-10-CM | POA: Diagnosis not present

## 2024-03-11 DIAGNOSIS — M961 Postlaminectomy syndrome, not elsewhere classified: Secondary | ICD-10-CM | POA: Diagnosis not present

## 2024-03-11 DIAGNOSIS — M5416 Radiculopathy, lumbar region: Secondary | ICD-10-CM | POA: Diagnosis not present

## 2024-03-11 NOTE — Telephone Encounter (Signed)
 Called to confirm/remind patient of their appointment at [facility] on 03/12/24.   Appointment:   [x] Confirmed  [] Left mess   [] No answer/No voice mail  [] VM Full/unable to leave message  [] Phone not in service  Patient reminded to bring all medications and/or complete list.  Confirmed patient has transportation. Gave directions, instructed to utilize valet parking.

## 2024-03-11 NOTE — Progress Notes (Signed)
 EPIC Encounter for ICM Monitoring  Patient Name: Katherine Larson is a 78 y.o. female Date: 03/11/2024 Primary Care Physican: Victorio Grave, MD Primary Cardiologist: Mitzie Anda Electrophysiologist: Carolynne Citron 01/25/2024 Office Weight: 126 lbs                                                       Attempted call to patient and unable to reach.   Transmission results reviewed.    CorVue thoracic impedance suggesting fluid levels since 4/12 with the exception of possible fluid accumulation from 4/4-4/12 (ED 4/12 for dx of Pneumonia).   Prescribed:  Furosemide  40 mg take 1 tablet(s) (40 mg total) by mouth daily.   Labs: 12/27/2023 Creatinine 1.04, BUN 32, Potassium 4.4, Sodium 137, GFR 56  11/22/2023 Creatinine 1.17, BUN 33, Potassium 4.5, Sodium 135, GFR 48  A complete set of results can be found in Results Review.   Recommendations:  Unable to reach.     Follow-up plan: ICM clinic phone appointment on 03/10/2024.   91 day device clinic remote transmission 05/02/2024.     EP/Cardiology Office Visits: 03/12/2024 with Dr Mitzie Anda.  Recall 08/24/2024 with Dr. Carolynne Citron (1 year).     Copy of ICM check sent to Dr. Carolynne Citron.   3 month ICM trend: 03/10/2024.    12-14 Month ICM trend:     Almyra Jain, RN 03/11/2024 3:24 PM

## 2024-03-11 NOTE — Telephone Encounter (Signed)
 Remote ICM transmission received.  Attempted call to patient regarding ICM remote transmission and left detailed message per DPR.  Left ICM phone number and advised to return call for any fluid symptoms or questions. Next ICM remote transmission scheduled 04/15/2024.

## 2024-03-12 ENCOUNTER — Encounter: Admitting: Cardiology

## 2024-03-14 ENCOUNTER — Telehealth: Payer: Self-pay | Admitting: Cardiology

## 2024-03-14 NOTE — Telephone Encounter (Signed)
 Called to confirm/remind patient of their appointment at [facility] on 03/17/24.   Appointment:   [x] Confirmed  [] Left mess   [] No answer/No voice mail  [] VM Full/unable to leave message  [] Phone not in service  Patient reminded to bring all medications and/or complete list.  Confirmed patient has transportation. Gave directions, instructed to utilize valet parking.

## 2024-03-17 ENCOUNTER — Ambulatory Visit: Attending: Cardiology | Admitting: Cardiology

## 2024-03-17 VITALS — BP 127/57 | HR 78 | Wt 125.0 lb

## 2024-03-17 DIAGNOSIS — I110 Hypertensive heart disease with heart failure: Secondary | ICD-10-CM | POA: Diagnosis not present

## 2024-03-17 DIAGNOSIS — I447 Left bundle-branch block, unspecified: Secondary | ICD-10-CM

## 2024-03-17 DIAGNOSIS — I2510 Atherosclerotic heart disease of native coronary artery without angina pectoris: Secondary | ICD-10-CM

## 2024-03-17 DIAGNOSIS — J449 Chronic obstructive pulmonary disease, unspecified: Secondary | ICD-10-CM | POA: Diagnosis not present

## 2024-03-17 DIAGNOSIS — Z7982 Long term (current) use of aspirin: Secondary | ICD-10-CM | POA: Insufficient documentation

## 2024-03-17 DIAGNOSIS — Z87891 Personal history of nicotine dependence: Secondary | ICD-10-CM | POA: Insufficient documentation

## 2024-03-17 DIAGNOSIS — Z8616 Personal history of COVID-19: Secondary | ICD-10-CM | POA: Insufficient documentation

## 2024-03-17 DIAGNOSIS — I5022 Chronic systolic (congestive) heart failure: Secondary | ICD-10-CM

## 2024-03-17 DIAGNOSIS — Z79899 Other long term (current) drug therapy: Secondary | ICD-10-CM | POA: Insufficient documentation

## 2024-03-17 DIAGNOSIS — M069 Rheumatoid arthritis, unspecified: Secondary | ICD-10-CM | POA: Insufficient documentation

## 2024-03-17 DIAGNOSIS — Z7984 Long term (current) use of oral hypoglycemic drugs: Secondary | ICD-10-CM | POA: Insufficient documentation

## 2024-03-17 DIAGNOSIS — I428 Other cardiomyopathies: Secondary | ICD-10-CM | POA: Diagnosis not present

## 2024-03-17 DIAGNOSIS — Z9981 Dependence on supplemental oxygen: Secondary | ICD-10-CM | POA: Diagnosis not present

## 2024-03-17 DIAGNOSIS — Z951 Presence of aortocoronary bypass graft: Secondary | ICD-10-CM | POA: Diagnosis not present

## 2024-03-17 DIAGNOSIS — I4892 Unspecified atrial flutter: Secondary | ICD-10-CM | POA: Diagnosis not present

## 2024-03-17 DIAGNOSIS — J9611 Chronic respiratory failure with hypoxia: Secondary | ICD-10-CM | POA: Insufficient documentation

## 2024-03-17 MED ORDER — FUROSEMIDE 40 MG PO TABS: 20.0000 mg | ORAL_TABLET | Freq: Every day | ORAL | 3 refills | Status: AC

## 2024-03-17 MED ORDER — SACUBITRIL-VALSARTAN 24-26 MG PO TABS: 1.0000 | ORAL_TABLET | Freq: Two times a day (BID) | ORAL | 6 refills | Status: DC

## 2024-03-17 NOTE — Progress Notes (Signed)
 PCP: Victorio Grave, MD Cardiology: Dr. Stann Earnest HF Cardiology: Dr. Mitzie Anda  78 y.o. with history of CAD s/p CABG and ischemic cardiomyopathy was referred by Dr. Stann Earnest for evaluation of CHF.  Patient has a long h/o heart disease.  In 1996, she had CABG with sequential LIMA-LAD and diagonal.  Last cath in 5/21 showed totally occluded LAD with patent LIMA-LAD and diagonal, minimal disease in LCx and RCA. LV systolic function has been reduced.  She has a Secondary school teacher ICD.  Last echo in 9/22 showed EF 25-30%, low normal RV function, moderate MR.  She has a chronic LBBB.  She additionally has rheumatoid arthritis and significant low back pain for which she has a spinal stimulator.   She tried to take Entresto  in the past and was very tired/fatigued on it.  She was unable to tolerate it even at low dose and stopped it.  She was lightheaded when we tried to increase Coreg  to 12.5 mg bid.   Follow up 1/24, NYHA II and volume stable. 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 ID and Cardiology consulted, and underwent TEE, showing LVEF 30-35%, normal RV, no LAA thrombus, no valvular or ICD vegetations. She was continued on 2 weeks of ciprofloxacin .  Echo in 4/25 showed EF 35-40%, normal RV.   Today she returns for HF follow up with her husband. She is legally blind and has chronic pain in her hands and knees from RA.  She is undergoing workup for TKR with orthopedics. The pain in her right knee is particularly intractable. She is not doing much walking due to knee pain.  Dyspnea with dressing and other forms of moderate exertion.   No orthopnea/PND.  No chest pain.  Using oxygen  only at night.   ECG: NSR, LBBB 150 msec  Labs (11/22): K 4.9, creatinine 0.95 Labs (2/23): K 4.7, creatinine 1.06 Labs (4/23): K 4.5,  creatinine 1.21, LDL 45, HDL 27, TGs 238 Labs (7/23): LDL 42, TGs 200 Labs (1/24): K 4.4, creatinine 0.81, hgb 10.8 Labs (3/24): K 4.2, creatinine 0.95 Labs (7/24): K 4.4, creatinine 0.98 Labs (2/25): K 4.4, creatinine 1.04 Labs (4/25): hgb 10.4  PMH: 1. HTN 2. Hyperlipidemia 3. GERD 4. Gout 5. Fe deficiency anemia 6. CAD: CABG in 1996 with sequential LIMA-diagonal and LAD.  - LHC (5/21): totally occluded LAD with patent sequent LIMA-D and LAD, no significant LCx or RCA disease.  7. Chronic systolic CHF: Ischemic cardiomyopathy.  St Jude ICD.  - Echo (9/22): EF 25-30%, low normal RV function, moderate MR.  - Echo (2/24): EF 30-35%, normal RV - TEE (3/24): LVEF 30-35%, normal RV, no LAA thrombus, no valvular or ICD vegetations. - Echo (4/25): EF 35-40%, normal RV 8. Low back pain: Arthritis, has spinal stimulator.  9. Chronic LBBB 10. Rheumatoid arthritis.  11. COPD: Prior smoker.   Social History   Socioeconomic History   Marital status: Married    Spouse name: Not on file   Number of children: 3   Years of education: Not on file   Highest education level: Not on file  Occupational History   Not on file  Tobacco Use   Smoking status: Former    Current packs/day: 0.00    Average packs/day: 0.8 packs/day for 15.0 years (11.3 ttl pk-yrs)    Types: Cigarettes  Start date: 11/21/1979    Quit date: 11/20/1994    Years since quitting: 29.3   Smokeless tobacco: Never   Tobacco comments:    Quit in 1996  Vaping Use   Vaping status: Never Used  Substance and Sexual Activity   Alcohol  use: Never    Alcohol /week: 0.0 standard drinks of alcohol    Drug use: Never   Sexual activity: Yes  Other Topics Concern   Not on file  Social History Narrative   2 living children, one deceased age 24   Right handed   One story home   Drinks coffee am   Social Drivers of Corporate investment banker Strain: Not on file  Food Insecurity: No Food Insecurity (01/28/2023)   Hunger Vital  Sign    Worried About Running Out of Food in the Last Year: Never true    Ran Out of Food in the Last Year: Never true  Transportation Needs: No Transportation Needs (01/28/2023)   PRAPARE - Administrator, Civil Service (Medical): No    Lack of Transportation (Non-Medical): No  Physical Activity: Not on file  Stress: Not on file  Social Connections: Not on file  Intimate Partner Violence: Not At Risk (01/28/2023)   Humiliation, Afraid, Rape, and Kick questionnaire    Fear of Current or Ex-Partner: No    Emotionally Abused: No    Physically Abused: No    Sexually Abused: No   Family History  Problem Relation Age of Onset   Heart attack Father    Heart attack Mother    Bladder Cancer Brother    Cervical cancer Daughter        ?   Colon cancer Neg Hx    ROS: All systems reviewed and negative except as per HPI.   Current Outpatient Medications  Medication Sig Dispense Refill   albuterol  (VENTOLIN  HFA) 108 (90 Base) MCG/ACT inhaler Inhale 1-2 puffs into the lungs every 6 (six) hours as needed for wheezing or shortness of breath. 18 g 0   allopurinol  (ZYLOPRIM ) 300 MG tablet Take 300 mg by mouth daily.     aspirin  EC 81 MG EC tablet Take 1 tablet (81 mg total) by mouth daily.     atorvastatin  (LIPITOR ) 80 MG tablet Take 80 mg by mouth daily.     carvedilol  (COREG ) 6.25 MG tablet Take 6.25 mg by mouth 2 (two) times daily with a meal.     cholecalciferol (VITAMIN D3) 25 MCG (1000 UT) tablet Take 1,000 Units by mouth daily.     dapagliflozin  propanediol (FARXIGA ) 10 MG TABS tablet TAKE 1 TABLET(10 MG) BY MOUTH DAILY BEFORE BREAKFAST 30 tablet 10   etanercept (ENBREL SURECLICK) 50 MG/ML injection Inject 50 mg into the skin once a week. For 30 days     ferrous sulfate 325 (65 FE) MG tablet Take 325 mg by mouth daily with breakfast. Every other day     fexofenadine (ALLEGRA) 180 MG tablet Take 180 mg by mouth daily.     fluticasone  (FLONASE ) 50 MCG/ACT nasal spray Place 1 spray  into both nostrils 2 (two) times daily.     gabapentin  (NEURONTIN ) 300 MG capsule Take 300 mg by mouth 3 (three) times daily.     HYDROcodone -acetaminophen  (NORCO) 10-325 MG tablet Take 1 tablet by mouth every 4 (four) hours as needed (3-4 times PRN).     lansoprazole  (PREVACID ) 30 MG capsule TAKE 1 CAPSULE BY MOUTH 1 TO 2 TIMES DAILY BEFORE A MEAL 60  capsule 5   leflunomide  (ARAVA ) 20 MG tablet Take 1 tablet (20 mg total) by mouth daily.     MAGNESIUM -OXIDE 400 (240 Mg) MG tablet TAKE 1 TABLET(400 MG) BY MOUTH DAILY 30 tablet 2   meclizine (ANTIVERT) 12.5 MG tablet Take 12.5 mg by mouth 3 (three) times daily as needed for dizziness.     montelukast  (SINGULAIR ) 10 MG tablet Take 10 mg by mouth at bedtime.     Multiple Vitamins-Minerals (OCUVITE ADULT 50+ PO) Take 1 tablet by mouth daily.     nitroGLYCERIN  (NITROSTAT ) 0.4 MG SL tablet DISSOLVE 1 TABLET UNDER THE  TONGUE EVERY 5 MINUTES AS NEEDED FOR CHEST PAIN. MAX OF 3 TABLETS IN 15 MINUTES. CALL 911 IF PAIN  PERSISTS. 75 tablet 4   sacubitril-valsartan (ENTRESTO ) 24-26 MG Take 1 tablet by mouth 2 (two) times daily. 60 tablet 6   spironolactone  (ALDACTONE ) 25 MG tablet Take 1 tablet (25 mg total) by mouth daily. 90 tablet 3   TRELEGY ELLIPTA 200-62.5-25 MCG/ACT AEPB Inhale 1 puff into the lungs daily.     VASCEPA  1 g capsule TAKE 1 CAPSULE(1 GRAM) BY MOUTH TWICE DAILY 120 capsule 10   furosemide  (LASIX ) 40 MG tablet Take 0.5 tablets (20 mg total) by mouth daily. 45 tablet 3   No current facility-administered medications for this visit.   Wt Readings from Last 3 Encounters:  03/17/24 125 lb (56.7 kg)  01/25/24 126 lb 12.8 oz (57.5 kg)  11/22/23 129 lb (58.5 kg)   BP (!) 127/57   Pulse 78   Wt 125 lb (56.7 kg)   SpO2 96%   BMI 22.50 kg/m  General: NAD. Frail, kyphotic.  Neck: No JVD, no thyromegaly or thyroid  nodule.  Lungs: Clear to auscultation bilaterally with normal respiratory effort. CV: Nondisplaced PMI.  Heart regular S1/S2 with  paradoxical S2 splitting, no S3/S4, no murmur.  No peripheral edema.  No carotid bruit.  Normal pedal pulses.  Abdomen: Soft, nontender, no hepatosplenomegaly, no distention.  Skin: Intact without lesions or rashes.  Neurologic: Alert and oriented x 3.  Psych: Normal affect. Extremities: No clubbing or cyanosis.  HEENT: Normal.    Assessment/Plan: 1. Chronic systolic CHF: Probably ischemic cardiomyopathy (h/o occluded LAD, s/p CABG). Coronaries have been unchanged since CABG in 1996. However, EF has declined over the years and I worry that another process may be present.  Unable to obtain cardiac MRI with spinal cord stimulator. 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 TEE (3/24) showed LVEF 30-35%, normal RV. Echo in 2/25 showed EF 35-40%, normal RV.  She is not volume overloaded on exam, NYHA class III symptoms (dyspnea with moderate exertion).  I suspect that her symptoms are more due to COPD and deconditioning at this point than CHF.  - She is now taking losartan  100 mg daily.  I am going to try her again on Entresto .  Stop losartan , start Entresto  24/26 bid.  I will also decrease Lasix  to 20 mg daily.  BMET/BNP today and BMET in 10 days.  - Continue spironolactone  25 mg daily.  - Continue Farxiga  10 mg daily.  - Continue Coreg  6.25 mg bid.  - We have discussed cardiac contractility modulation, but ECG today shows wider LBBB than in the past, 150 msec.  Rather than CCM, I would like to have her looked at for CRT upgrade.  Will send back to EP.  2. CAD:  CABG 1996.  Cath in 5/21 due to fall in EF showed totally occluded LAD, patent RCA and LCx, patent LIMA-LAD and diagonal.  No change, no interventional target.  No chest pain.  - Continue ASA 81 daily.  - Continue atorvastatin  80 daily + Vasce[place], check lipids today. 3. Chronic Respiratory Failure with hypoxia: Admission (3/24) for multilobar PNA, BCx  + pseudomonas aeruginosa. Required BiPap and eventually discharged with home O2. CXR suggestive of chronic emphysema and scarring. Of note, had COVID 2/24. Using oxygen  at night.  4. Rheumatoid arthritis: Severe knee arthritis with intractable pain significantly limiting mobility.  Recent echo with slightly improved EF, 35-40%.  She is reasonably well-compensated in terms of her CHF.  I think she would be of reasonable risk to undergo TKR from cardiac perspective.  Would consider pulmonary evaluation prior to surgery, however.   Follow up in 3-4 weeks with APP.   I spent 31 minutes reviewing records, interviewing/examining patient, and managing orders.   Peder Bourdon 03/17/2024

## 2024-03-17 NOTE — Patient Instructions (Signed)
 Medication Changes:  STOP Losartan   START Entresto  24-26mg  (1 tab) two times daily  DECREASE Furosemide  20mg  daily  Lab Work:  Go DOWN to LOWER LEVEL (LL) to have your blood work completed inside of [employer] office.  We will only call you if the results are abnormal or if the provider would like to make medication changes.  Referrals:  You have been referred to the [facility] office in [place] to Dr. Carolynne Citron. This office will be in contact with you in order to schedule an appointment.   Follow-Up in: Please follow up with the [facility] in 3-4 weeks with an Advanced Practice Provider.  At the [facility], you and your health needs are our priority. We have a designated team specialized in the treatment of Heart Failure. This Care Team includes your primary Heart Failure Specialized Cardiologist (physician), Advanced Practice Providers (APPs- Physician Assistants and Nurse Practitioners), and Pharmacist who all work together to provide you with the care you need, when you need it.   You may see any of the following providers on your designated Care Team at your next follow up:  Dr. Jules Oar Dr. Peder Bourdon Dr. Alwin Baars Dr. Judyth Nunnery Shawnee Dellen, FNP Bevely Brush, RPH-CPP  Please be sure to bring in all your medications bottles to every appointment.   Need to Contact Us :  If you have any questions or concerns before your next appointment please send us  a message through [facility] or call our office at [phone].    TO LEAVE A MESSAGE FOR THE NURSE SELECT OPTION 2, PLEASE LEAVE A MESSAGE INCLUDING: YOUR NAME DATE OF BIRTH CALL BACK NUMBER REASON FOR CALL**this is important as we prioritize the call backs  YOU WILL RECEIVE A CALL BACK THE SAME DAY AS LONG AS YOU CALL BEFORE 4:00 PM

## 2024-03-18 ENCOUNTER — Other Ambulatory Visit (HOSPITAL_COMMUNITY): Payer: Self-pay

## 2024-03-18 LAB — BASIC METABOLIC PANEL WITH GFR
BUN/Creatinine Ratio: 24 (ref 12–28)
BUN: 34 mg/dL — ABNORMAL HIGH (ref 8–27)
CO2: 28 mmol/L (ref 20–29)
Calcium: 12 mg/dL — ABNORMAL HIGH (ref 8.7–10.3)
Chloride: 96 mmol/L (ref 96–106)
Creatinine, Ser: 1.39 mg/dL — ABNORMAL HIGH (ref 0.57–1.00)
Glucose: 103 mg/dL — ABNORMAL HIGH (ref 70–99)
Potassium: 4.6 mmol/L (ref 3.5–5.2)
Sodium: 140 mmol/L (ref 134–144)
eGFR: 39 mL/min/{1.73_m2} — ABNORMAL LOW (ref >59)

## 2024-03-18 LAB — LIPID PANEL
Chol/HDL Ratio: 6 ratio — ABNORMAL HIGH (ref 0.0–4.4)
Cholesterol, Total: 191 mg/dL (ref 100–199)
HDL: 32 mg/dL — ABNORMAL LOW (ref >39)
LDL Chol Calc (NIH): 122 mg/dL — ABNORMAL HIGH (ref 0–99)
Triglycerides: 209 mg/dL — ABNORMAL HIGH (ref 0–149)
VLDL Cholesterol Cal: 37 mg/dL (ref 5–40)

## 2024-03-18 LAB — BRAIN NATRIURETIC PEPTIDE: BNP: 81 pg/mL (ref 0.0–100.0)

## 2024-03-19 ENCOUNTER — Telehealth: Payer: Self-pay | Admitting: Pharmacist

## 2024-03-19 NOTE — Telephone Encounter (Addendum)
 Dr. Mitzie Anda would like to start Repatha  for LDL above goal on high intensity statin. Repatha  prior authorization was submitted and approved.

## 2024-03-20 ENCOUNTER — Ambulatory Visit: Attending: Family Medicine | Admitting: Occupational Therapy

## 2024-03-20 ENCOUNTER — Telehealth: Payer: Self-pay | Admitting: Pharmacist

## 2024-03-20 DIAGNOSIS — H548 Legal blindness, as defined in USA: Secondary | ICD-10-CM | POA: Diagnosis not present

## 2024-03-20 DIAGNOSIS — R41842 Visuospatial deficit: Secondary | ICD-10-CM | POA: Diagnosis present

## 2024-03-20 MED ORDER — REPATHA SURECLICK 140 MG/ML ~~LOC~~ SOAJ: 140.0000 mg | SUBCUTANEOUS | 2 refills | Status: DC

## 2024-03-20 NOTE — Telephone Encounter (Signed)
 Patient called to inform PA approval for Repatha . Patient was educated on the medication. Prescription sent in.

## 2024-03-20 NOTE — Therapy (Signed)
 OUTPATIENT OCCUPATIONAL THERAPY LOW VISION TREATMENT  Patient Name: Katherine Larson MRN: [id number] DOB:18-Oct-1946, 78 y.o., female Today's Date: 03/20/2024  PCP: Victorio Grave, MD  REFERRING PROVIDER: [facility] - Dr. Carloyn Chi  END OF SESSION:  OT End of Session - 03/20/24 1241     Visit Number 3    Number of Visits 7    Date for OT Re-Evaluation 05/16/24    Authorization Type [employer] - auth required    OT Start Time 1240    OT Stop Time 1318    OT Time Calculation (min) 38 min    Activity Tolerance Patient tolerated treatment well    Behavior During Therapy WFL for tasks assessed/performed            Past Medical History:  Diagnosis Date   Anemia    anemia of chronic disease +/- IDA followed by Dr. Liam Redhead. received iron  infusions in the past   Asthma    Borderline glaucoma    CAD (coronary artery disease)    a. s/p CABG 1996. b. low risk nuc 2011. c. LHC 04/2016 due to drop in EF -> occluded native LAD,  widely patent sequential LIMA-D1-LAD.   Chronic kidney disease    "mild stage of kidney disease"   Chronic systolic CHF (congestive heart failure) (HCC)    Complication of anesthesia    DM2 (diabetes mellitus, type 2) (HCC)    not on any medicine for this at this time, 05/2016   Dyspnea    with exertion   GERD (gastroesophageal reflux disease)    Gout    History of blood transfusion    History of hiatal hernia    in 20s   History of pneumonia    March, 2016   HLD (hyperlipidemia)    HTN (hypertension)    Hyperthyroidism 01/21/2016   Inappropriate sinus tachycardia (HCC)    LBBB (left bundle branch block)    a. Seen in 04/2016   Lymphocytic colitis 04/2020   Macular degeneration    left   Myocardial infarction [facility])    1996   Neuropathy    Neuropathy    NICM (nonischemic cardiomyopathy) (HCC)    a. Dx 04/2016 - EF 25-30%, diffuse HK, elevated LVEDP, mild MR, mod LAE.   Normocytic anemia 10/17/2021   NSVT (nonsustained ventricular  tachycardia) (HCC)    PAT (paroxysmal atrial tachycardia) (HCC)    Pneumonia    PONV (postoperative nausea and vomiting)    "after just about every surgery I've had"   Rheumatoid arthritis (HCC)    RA- hands   Seasonal allergies    Past Surgical History:  Procedure Laterality Date   ABDOMINAL HYSTERECTOMY     APPENDECTOMY     BACK SURGERY     BIOPSY  04/27/2020   Procedure: BIOPSY;  Surgeon: Suzette Espy, MD;  [facility]: [facility];  Service: Endoscopy;;  ascending colon biopsy   CARDIAC CATHETERIZATION N/A 05/01/2016   Procedure: Right/Left Heart Cath and Coronary/Graft Angiography;  Surgeon: Arleen Lacer, MD;  Location: [facility] INVASIVE CV LAB;  Service: Cardiovascular;  Laterality: N/A;   CHOLECYSTECTOMY     COLONOSCOPY  11/2011   Dr. Magod:ext/int hemorrhoids, diverticulosis sigmoid colon and distal desc colon, mid-desc colon hyperplastic polyps.    COLONOSCOPY WITH PROPOFOL  N/A 04/27/2020   Rourk: Diverticulosis, hemorrhoids, normal terminal ileum.  Random colon biopsies significant for chronic lymphocytic colitis. No repeat due to age.   CORONARY ARTERY BYPASS GRAFT  1996   2  vessels   ESOPHAGOGASTRODUODENOSCOPY  11/2011   Dr. Lavaughn [place]: small hiatal hernia, one non-bleeding superficial gastric ulcer, medium-sized diverticulum in area of papilla   ESOPHAGOGASTRODUODENOSCOPY N/A 12/29/2015   Dr. Riley Cheadle: Chronic inactive gastritis, normal esophagus status post dilation, duodenal diverticula   ESOPHAGOGASTRODUODENOSCOPY (EGD) WITH PROPOFOL  N/A 07/24/2019   Dr. Riley Cheadle: Normal esophagus status post empiric dilation, small hiatal hernia, large D2 diverticulum   ESOPHAGOGASTRODUODENOSCOPY (EGD) WITH PROPOFOL  N/A 06/03/2021   Surgeon: Suzette Espy, MD; normal esophagus, normal stomach, normal duodenum.  Abnormal hypopharyngeal mucosa bilaterally just distal to the base of the tongue (right side greater than left), overlying mucosa appeared irregular and somewhat verrucous in appearance.   Recommended ENT evaluation.   EYE SURGERY Bilateral    cataract removal   FOOT SURGERY     removal bone spur   FRACTURE SURGERY Right    arm   GIVENS CAPSULE STUDY  11/2012   Dr. Lavaughn [place]: minimal gastritis, normal small bowel capsule   HERNIA REPAIR     hiatal hernia   ICD IMPLANT N/A 02/04/2021   Procedure: ICD IMPLANT;  Surgeon: Tammie Fall, MD;  Location: [facility] INVASIVE CV LAB;  Service: Cardiovascular;  Laterality: N/A;   MALONEY DILATION N/A 07/24/2019   Procedure: Londa Rival DILATION;  Surgeon: Suzette Espy, MD;  Location: [facility];  Service: Endoscopy;  Laterality: N/A;   MALONEY DILATION N/A 06/03/2021   Procedure: Londa Rival DILATION;  Surgeon: Suzette Espy, MD;  Location: [facility];  Service: Endoscopy;  Laterality: N/A;   MAXIMUM ACCESS (MAS)POSTERIOR LUMBAR INTERBODY FUSION (PLIF) 1 LEVEL N/A 08/14/2013   Procedure:  MAXIMUM ACCESS SURGERY(MAS) POSTERIOR LUMBAR INTERBODY FUSION LUMBAR THREE-FOUR ;  Surgeon: Isadora Mar, MD;  Location: [facility];  Service: Neurosurgery;  Laterality: N/A;   MAXIMUM ACCESS SURGERY(MAS) POSTERIOR LUMBAR INTERBODY FUSION LUMBAR THREE-FOUR    PTCA     RIGHT/LEFT HEART CATH AND CORONARY ANGIOGRAPHY N/A 03/26/2020   Procedure: RIGHT/LEFT HEART CATH AND CORONARY ANGIOGRAPHY;  Surgeon: Arleen Lacer, MD;  Location: [facility] INVASIVE CV LAB;  Service: Cardiovascular;  Laterality: N/A;   SPINAL CORD STIMULATOR BATTERY EXCHANGE N/A 01/19/2021   Procedure: Spinal cord stimulator battery change;  Surgeon: Annis Kinder, MD;  Location: [facility] OR;  Service: Neurosurgery;  Laterality: N/A;   SPINAL CORD STIMULATOR INSERTION N/A 06/16/2016   Procedure: LUMBAR SPINAL CORD STIMULATOR INSERTION;  Surgeon: Gerri Kras, MD;  Location: [facility];  Service: Neurosurgery;  Laterality: N/A;  LUMBAR SPINAL CORD STIMULATOR INSERTION   TEE WITHOUT CARDIOVERSION N/A 02/01/2023   Procedure: TRANSESOPHAGEAL ECHOCARDIOGRAM (TEE);  Surgeon: Gerard Knight, MD;   Location: [facility];  Service: Cardiovascular;  Laterality: N/A;   tens  05/2016   TONSILLECTOMY     Patient Active Problem List   Diagnosis Date Noted   Bacteremia 02/01/2023   Pseudomonas sepsis (HCC) 01/30/2023   Acute respiratory failure with hypoxia (HCC) 01/28/2023   Multifocal pneumonia 01/28/2023   Melena 11/28/2022   Rectal bleeding 11/28/2022   Upper abdominal pain 11/28/2022   Early satiety 07/27/2022   Nausea without vomiting 07/27/2022   Hypotension 02/10/2022   Normocytic anemia 10/17/2021   Lymphocytic colitis 07/02/2020   Coronary artery disease, occlusive: CTO proxLAD 03/26/2020   Abnormal nuclear stress test 03/26/2020   Chronic diarrhea 03/02/2020   NICM- new drop in EF 25-30% 05/03/2016   Acute pulmonary edema (HCC)    LBBB (left bundle branch block) 04/29/2016   Chronic HFrEF (heart failure with reduced ejection  fraction) (HCC) 04/29/2016   Abdominal pain, chronic, epigastric 03/07/2016   Septic shock (HCC)    COPD exacerbation (HCC) 01/21/2016   Hyperthyroidism 01/21/2016   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   Hypokalemia 01/19/2016   Hyponatremia 01/19/2016   Diet-controlled diabetes mellitus (HCC) 01/19/2016   CAP (community acquired pneumonia) 01/18/2016   Mucosal abnormality of stomach    Dysphagia    Constipation 12/08/2015   Gastroesophageal reflux disease 12/08/2015   Abnormal CT scan, esophagus 12/08/2015   Esophageal dysphagia 12/08/2015   Abdominal pain, epigastric 12/08/2015   Loss of weight 12/08/2015   Lower abdominal pain 12/08/2015   Back pain 08/26/2015   Headache 08/26/2015   Essential hypertension 08/26/2015   DM type 2 (diabetes mellitus, type 2) (HCC) 08/26/2015   Sinus tachycardia 06/07/2015   Diarrhea 06/07/2015   Preop cardiovascular exam 07/16/2013   Atrial flutter (HCC) 07/16/2013   Anemia 01/08/2013   Leukocytosis 12/17/2012   DYSPNEA 02/02/2010   DIABETES MELLITUS 07/28/2009   Hyperlipidemia 07/28/2009    CHOLECYSTECTOMY, HX OF 07/28/2009   Hx of CABG '96- cath 05/01/16 07/28/2009   HERNIORRHAPHY, HX OF 07/28/2009    ONSET DATE: 12/18/2023 (Date of referral) - reports significant decline in vision over the last couple years  REFERRING DIAG: H35.30 (ICD-10-CM) - Macular degeneration   THERAPY DIAG:  Visuospatial deficit  Legally blind  Rationale for Evaluation and Treatment: Rehabilitation  SUBJECTIVE:   SUBJECTIVE STATEMENT:  She has been able to see her phone screen slightly better with the inverted text.   Her cardiologist cleared her for knee surgery.  She really likes to play Monopoly. She used to play Rummy with her husband.   Pt accompanied by: self and husband    PERTINENT HISTORY: PMH: DM II, CKD, CAD, hyperthyroidism, borderline glaucoma, AMD, spinal cord stimulator, RA, neuropathy in feet, MI (1996), CHF, HLD, HTN  PAIN:  Are you having pain? Yes: NPRS scale: 5/10 Pain location: R knee and back Pain description: excruciating; throbs Aggravating factors: getting up Relieving factors: positioning  FALLS: Has patient fallen in last 6 months? 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; retired Garment/textile technologist  PATIENT GOALS: be as independent as possible/do as much as she can  OBJECTIVE:   GLASSES: N/A  CURRENT MODIFICATIONS/ADAPTIONS: uses flashlight to see help read items  DEVICES: Husband reports 20 and 30 x magnifiers but pt unable to use these to read anymore  PHONE: does not know how to use; husband reports their daughter is in the process of getting the pt an iPhone  HAND DOMINANCE: Right  ADLs IADLs: Overall ADLs: mostly mod I with accommodations   Meal Prep: dependent  Handwriting: Not legible and  unable  MOBILITY STATUS: Needs Assist: min A for sit to stand and min A to navigate using RW  FUNCTIONAL OUTCOME MEASURES: AM-PAC 6 Clicks for ADLs 1. Putting on and taking off regular lower body clothing?  A little  2. Bathing (including washing, rinsing, drying)? A little 3. Toileting, which includes using toilet, bedpan, or urinal?  A little 4. Putting on and taking off regular upper body clothing?  A little 5. Personal grooming such as brushing teeth?  A little 6. Eating meals?  A little  RAW Score: 18/24 Functional Impairment: 47%  03/20/2024: PSFS 1.5  total score   Total score = sum of the activity scores/number of activities Minimum detectable change (90%CI) for average score = 2 points Minimum detectable change (90%CI) for single activity score = 3 points  COGNITION: Overall cognitive status: Within functional limits for tasks assessed  VISUAL FINDINGS: Baseline vision: Legally blind Visual history: glaucoma and macular degeneration  Impaired Reading acuity: 20/320 - distance acuity per referral: CF @ 2' OU  OBSERVATIONS: Pt appears well-kept. Ambulates with use of RW and min to CGA, altered gait, but no LOB. No glasses donned.   TODAY'S TREATMENT:                                                                                                                               OT educated pt on table top play of Golf Solitaire for BUE ROM, coordination, visual processing, scanning, and sequencing. Pt required minimal cues for proper play. OT educated pt on use of large face playing cards and ability to take a visual break as needed if vision becomes blurry after playing for an extended period of time.   PATIENT EDUCATION: Education details: Manufacturing systems engineer Person educated: Patient Education method: Explanation  Education comprehension: verbalized understanding and needs further education  HOME EXERCISE PROGRAM: Not yet initiated   GOALS:  SHORT TERM GOALS:  Target date: 03/21/2024     Patient will independently recall at least 2 compensatory strategies for visual impairment without cueing. Baseline: Goal status: INITIAL  2.  Patient will return demonstration of visual adaptation use and verbalize understanding of how to obtain item(s) if desired.  Baseline:  Goal status: INITIAL   LONG TERM GOALS: Target date: 05/16/24   Patient will report at least two-point increase in average PSFS score or at least three-point increase in a single activity score indicating functionally significant improvement given minimum detectable change.  Baseline: 1.5 total score (See above for individual activity scores)  Goal status: INITIAL  ASSESSMENT:  CLINICAL IMPRESSION: Patient demonstrates good understanding of activity today though may require additional review as pt does not have an adapted deck of cards at home to practice. Pt may need to put therapy on hold depending on when she has knee surgery scheduled.   PERFORMANCE DEFICITS: in functional skills including ADLs, IADLs, pain, mobility, endurance, decreased knowledge of precautions, decreased knowledge of use of DME, and vision.   IMPAIRMENTS: are limiting patient from ADLs, IADLs, leisure, and social participation.   CO-MORBIDITIES: has co-morbidities such as CHF, CAD, HTN, DMII, and chronic pain  that affects occupational performance. Patient will benefit from skilled OT to address above impairments and improve overall function.  REHAB POTENTIAL: Fair for goals stated  PLAN:  OT FREQUENCY: every other week  OT DURATION:  6 sessions  PLANNED INTERVENTIONS: self care/ADL training, therapeutic exercise, therapeutic activity, functional mobility training, patient/family education, visual/perceptual remediation/compensation, coping strategies training, DME and/or AE instructions, and Re-evaluation  RECOMMENDED OTHER SERVICES: Services for the blind  CONSULTED AND AGREED WITH PLAN OF CARE:  Patient and husband  PLAN FOR NEXT SESSION: PSFS - update goal PRN; review golf solitaire; Go over low vision packet; [facility] status; iPhone status/modifications to current phone from last session   Altamease Asters, OT 03/20/2024, 5:58 PM

## 2024-03-21 ENCOUNTER — Inpatient Hospital Stay: Attending: Hematology

## 2024-03-21 ENCOUNTER — Inpatient Hospital Stay

## 2024-03-21 VITALS — BP 128/53 | HR 66 | Temp 98.1°F | Resp 16

## 2024-03-21 DIAGNOSIS — D649 Anemia, unspecified: Secondary | ICD-10-CM

## 2024-03-21 DIAGNOSIS — N1831 Chronic kidney disease, stage 3a: Secondary | ICD-10-CM | POA: Insufficient documentation

## 2024-03-21 DIAGNOSIS — I129 Hypertensive chronic kidney disease with stage 1 through stage 4 chronic kidney disease, or unspecified chronic kidney disease: Secondary | ICD-10-CM | POA: Diagnosis not present

## 2024-03-21 DIAGNOSIS — D631 Anemia in chronic kidney disease: Secondary | ICD-10-CM | POA: Diagnosis not present

## 2024-03-21 LAB — CBC WITH DIFFERENTIAL/PLATELET
Abs Immature Granulocytes: 0.15 10*3/uL — ABNORMAL HIGH (ref 0.00–0.07)
Basophils Absolute: 0.1 10*3/uL (ref 0.0–0.1)
Basophils Relative: 1 %
Eosinophils Absolute: 0.3 10*3/uL (ref 0.0–0.5)
Eosinophils Relative: 2 %
HCT: 34.3 % — ABNORMAL LOW (ref 36.0–46.0)
Hemoglobin: 10.5 g/dL — ABNORMAL LOW (ref 12.0–15.0)
Immature Granulocytes: 1 %
Lymphocytes Relative: 25 %
Lymphs Abs: 2.9 10*3/uL (ref 0.7–4.0)
MCH: 32.4 pg (ref 26.0–34.0)
MCHC: 30.6 g/dL (ref 30.0–36.0)
MCV: 105.9 fL — ABNORMAL HIGH (ref 80.0–100.0)
Monocytes Absolute: 1 10*3/uL (ref 0.1–1.0)
Monocytes Relative: 9 %
Neutro Abs: 7.3 10*3/uL (ref 1.7–7.7)
Neutrophils Relative %: 62 %
Platelets: 286 10*3/uL (ref 150–400)
RBC: 3.24 MIL/uL — ABNORMAL LOW (ref 3.87–5.11)
RDW: 14.9 % (ref 11.5–15.5)
WBC: 11.7 10*3/uL — ABNORMAL HIGH (ref 4.0–10.5)
nRBC: 0 % (ref 0.0–0.2)

## 2024-03-21 MED ORDER — EPOETIN ALFA-EPBX 10000 UNIT/ML IJ SOLN
10000.0000 [IU] | Freq: Once | INTRAMUSCULAR | Status: AC
  Administered 2024-03-21: 10000 [IU] via SUBCUTANEOUS
  Filled 2024-03-21: qty 1

## 2024-03-21 NOTE — Progress Notes (Signed)
 Katherine Larson presents today for Retacrit  injection per the provider's orders.  Hgb noted to be 10.5.  Stable during administration without incident; injection site WNL; see MAR for injection details.  Patient tolerated procedure well and without incident.  No questions or complaints noted at this time.

## 2024-03-21 NOTE — Patient Instructions (Signed)
 [facility] [place] - A DEPT OF [facility]  Discharge Instructions: Thank you for choosing [facility] to provide your oncology and hematology care.  If you have a lab appointment with the Cancer Center - please note that after April 8th, 2024, all labs will be drawn in the cancer center.  You do not have to check in or register with the main entrance as you have in the past but will complete your check-in in the cancer center.  Wear comfortable clothing and clothing appropriate for easy access to any Portacath or PICC line.   We strive to give you quality time with your provider. You may need to reschedule your appointment if you arrive late (15 or more minutes).  Arriving late affects you and other patients whose appointments are after yours.  Also, if you miss three or more appointments without notifying the office, you may be dismissed from the clinic at the provider's discretion.      For prescription refill requests, have your pharmacy contact our office and allow 72 hours for refills to be completed.    Today you received the following chemotherapy and/or immunotherapy agents Retacrit      To help prevent nausea and vomiting after your treatment, we encourage you to take your nausea medication as directed.  BELOW ARE SYMPTOMS THAT SHOULD BE REPORTED IMMEDIATELY: *FEVER GREATER THAN 100.4 F (38 C) OR HIGHER *CHILLS OR SWEATING *NAUSEA AND VOMITING THAT IS NOT CONTROLLED WITH YOUR NAUSEA MEDICATION *UNUSUAL SHORTNESS OF BREATH *UNUSUAL BRUISING OR BLEEDING *URINARY PROBLEMS (pain or burning when urinating, or frequent urination) *BOWEL PROBLEMS (unusual diarrhea, constipation, pain near the anus) TENDERNESS IN MOUTH AND THROAT WITH OR WITHOUT PRESENCE OF ULCERS (sore throat, sores in mouth, or a toothache) UNUSUAL RASH, SWELLING OR PAIN  UNUSUAL VAGINAL DISCHARGE OR ITCHING   Items with * indicate a potential emergency and should be followed up  as soon as possible or go to the Emergency Department if any problems should occur.  Please show the CHEMOTHERAPY ALERT CARD or IMMUNOTHERAPY ALERT CARD at check-in to the Emergency Department and triage nurse.  Should you have questions after your visit or need to cancel or reschedule your appointment, please contact [facility] [place] - A DEPT OF [facility] [phone]  and follow the prompts.  Office hours are 8:00 a.m. to 4:30 p.m. Monday - Friday. Please note that voicemails left after 4:00 p.m. may not be returned until the following business day.  We are closed weekends and major holidays. You have access to a nurse at all times for urgent questions. Please call the main number to the clinic [phone] and follow the prompts.  For any non-urgent questions, you may also contact your provider using MyChart. We now offer e-Visits for anyone 90 and older to request care online for non-urgent symptoms. For details visit [email or web address].   Also download the MyChart app! Go to the app store, search "MyChart", open the app, select [place], and log in with your MyChart username and password.

## 2024-03-26 ENCOUNTER — Ambulatory Visit: Admitting: Gastroenterology

## 2024-03-26 ENCOUNTER — Encounter: Payer: Self-pay | Admitting: Gastroenterology

## 2024-03-26 VITALS — BP 110/58 | HR 79 | Temp 98.0°F | Ht 62.5 in | Wt 129.6 lb

## 2024-03-26 DIAGNOSIS — K5900 Constipation, unspecified: Secondary | ICD-10-CM

## 2024-03-26 DIAGNOSIS — K219 Gastro-esophageal reflux disease without esophagitis: Secondary | ICD-10-CM | POA: Diagnosis not present

## 2024-03-26 DIAGNOSIS — D649 Anemia, unspecified: Secondary | ICD-10-CM | POA: Diagnosis not present

## 2024-03-26 NOTE — Progress Notes (Signed)
 GI Office Note    Referring Provider: Victorio Grave, MD Primary Care Physician:  Victorio Grave, MD  Primary Gastroenterologist: Rheba Cedar, MD   Chief Complaint   Chief Complaint  Patient presents with   Follow-up    History of Present Illness   Katherine Larson is a 78 y.o. female presenting today  for follow up. Last seen 03/2023. 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 She has chronic anemia followed by hematology. History of opioid induced constipation. She is legally blind.  From a GI standpoint she is doing ok. Moves her bowels about every other day. Stools soft unless she skips more then 2 days without a BM. No longer using Linzess . No melena, brbpr.  Denies any abdominal pain on regular basis.  Has occasional pain somewhat nonspecific.  No nausea or vomiting.  Appetite is improved.  No heartburn or dysphagia.  A lot of issues with joint pain, right knee pain.  Needs joint replacement surgery due to severe deformity in the setting of RA.  Currently having to get clearance from all of her specialist for surgery.  She has to see pulmonology at the end of the week and then she believes she will be able to move forward.    Colonoscopy June 2021: - Non-bleeding internal hemorrhoids. - Diverticulosis in the sigmoid colon and in the descending colon. Status post segmental biopsy. - The examination was otherwise normal on direct and retroflexion views. Normal appearing terminal ileum. -Biopsy showed chronic lymphocytic colitis   EGD 05/2021: normal esophagus, small hiatal hernia. Abnormal hypopharyngeal area, verrucous in appearance, followed with ENT.  Capsule endoscopy 11/2012: Dr. Lavaughn [place], normal.  Medications   Current Outpatient Medications  Medication Sig Dispense Refill   albuterol  (VENTOLIN  HFA) 108 (90 Base) MCG/ACT inhaler Inhale 1-2 puffs into the lungs every 6 (six) hours as needed for wheezing or shortness of breath. 18 g 0    allopurinol  (ZYLOPRIM ) 300 MG tablet Take 300 mg by mouth daily.     aspirin  EC 81 MG EC tablet Take 1 tablet (81 mg total) by mouth daily.     atorvastatin  (LIPITOR ) 80 MG tablet Take 80 mg by mouth daily.     carvedilol  (COREG ) 6.25 MG tablet Take 6.25 mg by mouth 2 (two) times daily with a meal.     cholecalciferol (VITAMIN D3) 25 MCG (1000 UT) tablet Take 1,000 Units by mouth daily.     dapagliflozin  propanediol (FARXIGA ) 10 MG TABS tablet TAKE 1 TABLET(10 MG) BY MOUTH DAILY BEFORE BREAKFAST 30 tablet 10   etanercept (ENBREL SURECLICK) 50 MG/ML injection Inject 50 mg into the skin once a week. For 30 days     Evolocumab  (REPATHA  SURECLICK) 140 MG/ML SOAJ Inject 140 mg into the skin every 14 (fourteen) days. 2 mL 2   ferrous sulfate 325 (65 FE) MG tablet Take 325 mg by mouth daily with breakfast. Every other day     fexofenadine (ALLEGRA) 180 MG tablet Take 180 mg by mouth daily.     fluticasone  (FLONASE ) 50 MCG/ACT nasal spray Place 1 spray into both nostrils 2 (two) times daily.     furosemide  (LASIX ) 40 MG tablet Take 0.5 tablets (20 mg total) by mouth daily. 45 tablet 3   gabapentin  (NEURONTIN ) 300 MG capsule Take 300 mg by mouth 3 (three) times daily.     HYDROcodone -acetaminophen  (NORCO) 10-325 MG tablet Take 1 tablet by mouth every 4 (four) hours as needed (3-4 times PRN).  lansoprazole  (PREVACID ) 30 MG capsule TAKE 1 CAPSULE BY MOUTH 1 TO 2 TIMES DAILY BEFORE A MEAL 60 capsule 5   leflunomide  (ARAVA ) 10 MG tablet Take 10 mg by mouth daily.     MAGNESIUM -OXIDE 400 (240 Mg) MG tablet TAKE 1 TABLET(400 MG) BY MOUTH DAILY 30 tablet 2   meclizine (ANTIVERT) 12.5 MG tablet Take 12.5 mg by mouth 3 (three) times daily as needed for dizziness.     montelukast  (SINGULAIR ) 10 MG tablet Take 10 mg by mouth at bedtime.     Multiple Vitamins-Minerals (OCUVITE ADULT 50+ PO) Take 1 tablet by mouth daily.     nitroGLYCERIN  (NITROSTAT ) 0.4 MG SL tablet DISSOLVE 1 TABLET UNDER THE  TONGUE EVERY 5  MINUTES AS NEEDED FOR CHEST PAIN. MAX OF 3 TABLETS IN 15 MINUTES. CALL 911 IF PAIN  PERSISTS. 75 tablet 4   sacubitril -valsartan  (ENTRESTO ) 24-26 MG Take 1 tablet by mouth 2 (two) times daily. 60 tablet 6   spironolactone  (ALDACTONE ) 25 MG tablet Take 1 tablet (25 mg total) by mouth daily. 90 tablet 3   TRELEGY ELLIPTA 200-62.5-25 MCG/ACT AEPB Inhale 1 puff into the lungs daily.     VASCEPA  1 g capsule TAKE 1 CAPSULE(1 GRAM) BY MOUTH TWICE DAILY 120 capsule 10   No current facility-administered medications for this visit.    Allergies   Allergies as of 03/26/2024 - Review Complete 03/26/2024  Allergen Reaction Noted   Biaxin [clarithromycin] Rash and Other (See Comments) 02/02/2010   Penicillins Rash and Other (See Comments) 02/02/2010   Hydroxychloroquine  Other (See Comments) 10/11/2022   Sulfamethoxazole  12/26/2022   Sulfasalazine Other (See Comments) 10/11/2022   Losartan  potassium  10/09/2021       Review of Systems   General: Negative for anorexia, weight loss, fever, chills, fatigue, +weakness. ENT: Negative for hoarseness, difficulty swallowing , nasal congestion. CV: Negative for chest pain, angina, palpitations, dyspnea on exertion, peripheral edema.  Respiratory: Negative for dyspnea at rest, dyspnea on exertion, cough, sputum, wheezing.  GI: See history of present illness. GU:  Negative for dysuria, hematuria, urinary incontinence, urinary frequency, nocturnal urination.  Endo: Negative for unusual weight change.     Physical Exam   BP (!) 110/58 (BP Location: Right Arm, Patient Position: Sitting, Cuff Size: Normal)   Pulse 79   Temp 98 F (36.7 C) (Oral)   Ht 5' 2.5" (1.588 m)   Wt 129 lb 9.6 oz (58.8 kg)   SpO2 95%   BMI 23.33 kg/m    General: Well-nourished, well-developed in no acute distress.  Eyes: No icterus. Mouth: Oropharyngeal mucosa moist and pink   Lungs: Clear to auscultation bilaterally.  Heart: Regular rate and rhythm, no murmurs rubs or  gallops.  Abdomen: Bowel sounds are normal, nontender, nondistended, no hepatosplenomegaly or masses,  no abdominal bruits or hernia , no rebound or guarding.  Rectal: not performed  Extremities: No lower extremity edema.   Neuro: Alert and oriented x 4   Skin: Warm and dry, no jaundice.   Psych: Alert and cooperative, normal mood and affect.  Labs   Lab Results  Component Value Date   WBC 11.7 (H) 03/21/2024   HGB 10.5 (L) 03/21/2024   HCT 34.3 (L) 03/21/2024   MCV 105.9 (H) 03/21/2024   PLT 286 03/21/2024   Lab Results  Component Value Date   NA 140 03/17/2024   CL 96 03/17/2024   K 4.6 03/17/2024   CO2 28 03/17/2024   BUN 34 (H) 03/17/2024  CREATININE 1.39 (H) 03/17/2024   EGFR 39 (L) 03/17/2024   CALCIUM  12.0 (H) 03/17/2024   PHOS 2.8 01/30/2023   ALBUMIN  3.5 12/27/2023   GLUCOSE 103 (H) 03/17/2024   Lab Results  Component Value Date   ALT 33 12/27/2023   AST 34 12/27/2023   ALKPHOS 80 12/27/2023   BILITOT 0.6 12/27/2023   Lab Results  Component Value Date   IRON  80 12/27/2023   TIBC 378 12/27/2023   FERRITIN 288 12/27/2023   Lab Results  Component Value Date   VITAMINB12 2,718 (H) 10/17/2021   Lab Results  Component Value Date   FOLATE 7.3 10/17/2021    Imaging Studies   DG Chest 2 View Result Date: 03/01/2024 CLINICAL DATA:  Sharp pain in the RIGHT lower chest. EXAM: CHEST - 2 VIEW COMPARISON:  CT 12/07/2023, chest radiograph 03/16/2023 FINDINGS: LEFT-sided pacer overlies normal cardiac silhouette. Midline sternotomy noted. Chronic bronchial thickening in the LEFT lower lobe. No RIGHT pneumonia identified. No pneumothorax. IMPRESSION: No explanation for RIGHT-sided pain. Consider CT if concern continues. Electronically Signed   By: Deboraha Fallow M.D.   On: 03/01/2024 15:32    Assessment/Plan:   GERD: doing well -continue lansoprazole  daily  Constipation: doing well, managing without Linzess  -if worsening symptoms, she will reach out for  RX  Anemia: followed by hematology, extensive GI/hematology work up. Hgb has been stable in the 9-11 range. No over GI bleeding.  -continue to follow with hematology   Return ov in one year or sooner if needed.  Trudie Fuse. Harles Lied, MHS, PA-C [facility]

## 2024-03-26 NOTE — Patient Instructions (Signed)
 We will continue to monitor your anemia along with hematology. Continue lansoprazole  once daily before breakfast. If you feel like you need something for constipation, just reach out.  We will see you back in one year or call sooner if needed.

## 2024-03-27 DIAGNOSIS — Z79899 Other long term (current) drug therapy: Secondary | ICD-10-CM | POA: Diagnosis not present

## 2024-03-27 DIAGNOSIS — M0579 Rheumatoid arthritis with rheumatoid factor of multiple sites without organ or systems involvement: Secondary | ICD-10-CM | POA: Diagnosis not present

## 2024-03-27 DIAGNOSIS — Z6822 Body mass index (BMI) 22.0-22.9, adult: Secondary | ICD-10-CM | POA: Diagnosis not present

## 2024-03-27 DIAGNOSIS — M1991 Primary osteoarthritis, unspecified site: Secondary | ICD-10-CM | POA: Diagnosis not present

## 2024-03-27 DIAGNOSIS — M1009 Idiopathic gout, multiple sites: Secondary | ICD-10-CM | POA: Diagnosis not present

## 2024-03-28 ENCOUNTER — Telehealth (HOSPITAL_COMMUNITY): Payer: Self-pay

## 2024-03-28 ENCOUNTER — Ambulatory Visit: Admitting: Pulmonary Disease

## 2024-03-28 ENCOUNTER — Encounter: Payer: Self-pay | Admitting: Pulmonary Disease

## 2024-03-28 ENCOUNTER — Ambulatory Visit (HOSPITAL_COMMUNITY)
Admission: RE | Admit: 2024-03-28 | Discharge: 2024-03-28 | Disposition: A | Discharge status: Home / Self Care | Source: Ambulatory Visit | Attending: Internal Medicine | Admitting: Internal Medicine

## 2024-03-28 VITALS — BP 124/54 | HR 68 | Ht 62.0 in | Wt 127.0 lb

## 2024-03-28 DIAGNOSIS — R0602 Shortness of breath: Secondary | ICD-10-CM

## 2024-03-28 DIAGNOSIS — I2510 Atherosclerotic heart disease of native coronary artery without angina pectoris: Secondary | ICD-10-CM | POA: Insufficient documentation

## 2024-03-28 DIAGNOSIS — Z87891 Personal history of nicotine dependence: Secondary | ICD-10-CM | POA: Diagnosis not present

## 2024-03-28 LAB — BASIC METABOLIC PANEL WITH GFR
Anion gap: 11 (ref 5–15)
BUN: 28 mg/dL — ABNORMAL HIGH (ref 8–23)
CO2: 25 mmol/L (ref 22–32)
Calcium: 8.7 mg/dL — ABNORMAL LOW (ref 8.9–10.3)
Chloride: 105 mmol/L (ref 98–111)
Creatinine, Ser: 1.11 mg/dL — ABNORMAL HIGH (ref 0.44–1.00)
GFR, Estimated: 52 mL/min — ABNORMAL LOW (ref >60)
Glucose, Bld: 116 mg/dL — ABNORMAL HIGH (ref 70–99)
Potassium: 5.3 mmol/L — ABNORMAL HIGH (ref 3.5–5.1)
Sodium: 141 mmol/L (ref 135–145)

## 2024-03-28 NOTE — Telephone Encounter (Signed)
  [facility]   Pre-operative Risk Assessment       Request for Surgical Clearance    Procedure:  Right Total Knee Arthroplasty  Date of Surgery:  Clearance TBD                               Surgeon:  Neil Balls, MD Surgeon's Group or Practice Name:  [facility]  Phone number:  [phone] Fax number:  [phone] { Type of Clearance Requested:   - Medical  - Pharmacy:  Hold Aspirin     Type of Anesthesia:  not listed   Additional requests/questions:  Please fax a copy of clearance to the surgeon's office.  Signed, Katherine Larson B Katherine Larson   03/28/2024, 9:39 AM   [facility]

## 2024-03-28 NOTE — Patient Instructions (Signed)
 VISIT SUMMARY:  Today, you were seen for a pulmonary evaluation before your upcoming knee replacement surgery. We discussed your breathing difficulties, history of rheumatoid arthritis, and the condition of your knee.  YOUR PLAN:  -POST-INFLAMMATORY PULMONARY SCARRING: This refers to mild scarring in your lungs from a previous pneumonia infection. It does not significantly affect your lung function. You are cleared for knee replacement surgery from a pulmonary perspective.  -PNEUMONIA: You have some mild scarring in your lungs from a past pneumonia infection, but there is no current active infection or significant lung issues.  -RHEUMATOID ARTHRITIS: This is an autoimmune condition that causes joint inflammation. You have been managing it with Arava  under the care of Dr. Dannie Duval. Your CT scan shows no lung disease related to this condition.  -KNEE JOINT DISLOCATION: Your knee has significant swelling and displacement, requiring knee replacement surgery. You are cleared for surgery from a pulmonary perspective, and a surgical clearance note will be sent to Dr. Neil Balls at [facility].  INSTRUCTIONS:  You are cleared for knee replacement surgery from a pulmonary perspective. A surgical clearance note will be sent to Dr. Neil Balls at [facility]. Continue to manage your rheumatoid arthritis with Arava  as directed by Dr. Dannie Duval. Use your oxygen  as needed, especially at night, and follow up with your doctors as scheduled.

## 2024-03-28 NOTE — Progress Notes (Addendum)
 Katherine Larson    [id number]    1945/12/14  Primary Care Physician:White, Adah Acron, MD  Referring Physician: Victorio Grave, MD [phone] [place],  [place] [address]  Chief complaint: Follow-up for dyspnea  HPI: 78 y.o.  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  She was diagnosed with COVID-19 pneumonia on 01/09/2023 which was treated as an outpatient with Paxlovid but then had worsening dyspnea and was hospitalized hospitalized on 02/07/2023 with acute respiratory failure with bilateral multilobar pneumonia, Pseudomonas bacteremia, sepsis with elevated troponins.  Admitted and required BiPAP.  Treated with antibiotics, IV fluids  Post discharge she has made some recovery but continues to have symptoms of dyspnea.  Denies any cough, sputum production, fevers or chills  Pets: No pets Occupation: Retired Garment/textile technologist Exposures: No mold, hot tub, Jacuzzi.  No feather pillows or comforters Smoking history: For 15 pack years.  Quit in 1996 Travel history: No significant travel history Relevant family history: No family history of lung disease  Interim history: Discussed the use of AI scribe software for clinical note transcription with the patient, who gave verbal consent to proceed.  History of Present Illness Katherine Larson is a 78 year old female who presents for pulmonary evaluation prior to knee replacement surgery. She was referred by Dr. Murrell Arrant for pulmonary clearance before knee replacement surgery.  She experiences significant swelling and displacement of her knee, describing it as resembling a 'frog leg'. The need for knee replacement arose after her leg became crooked following the lifting of a heavy object. She is currently under the  care of Dr. Murrell Arrant at [facility] for this issue.  She has a history of breathing difficulties, particularly at night, where she cannot lay flat and requires oxygen . She uses a walker, which sometimes causes her to become short of breath. She is not on oxygen  during the day but uses it at night. No snoring or apneas during sleep are reported. An oxygen  tank is kept in the living room for use when she feels breathless.  She has a history of rheumatoid arthritis for about eight years, managed by Dr. Dannie Duval. She is on Arava  for this condition. There is a mention of previous COVID-19 and pneumonia, with some residual mild scarring noted on a CT scan from January, but no significant lung function impairment.    Outpatient Encounter Medications as of 03/28/2024  Medication Sig   albuterol  (VENTOLIN  HFA) 108 (90 Base) MCG/ACT inhaler Inhale 1-2 puffs into the lungs every 6 (six) hours as needed for wheezing or shortness of breath.   allopurinol  (ZYLOPRIM ) 300 MG tablet Take 300 mg by mouth daily.   aspirin  EC 81 MG EC tablet Take 1 tablet (81 mg total) by mouth daily.   atorvastatin  (LIPITOR ) 80 MG tablet Take 80 mg by mouth daily.   carvedilol  (COREG ) 6.25 MG tablet Take 6.25 mg by mouth 2 (two) times daily with a meal.   cholecalciferol (VITAMIN D3) 25 MCG (1000 UT) tablet Take 1,000 Units by mouth daily.   dapagliflozin  propanediol (FARXIGA ) 10 MG TABS tablet TAKE 1 TABLET(10 MG) BY MOUTH DAILY BEFORE BREAKFAST   etanercept (ENBREL SURECLICK) 50 MG/ML injection Inject 50 mg into the skin once a week. For 30 days  Evolocumab  (REPATHA  SURECLICK) 140 MG/ML SOAJ Inject 140 mg into the skin every 14 (fourteen) days.   ferrous sulfate 325 (65 FE) MG tablet Take 325 mg by mouth daily with breakfast. Every other day   fexofenadine (ALLEGRA) 180 MG tablet Take 180 mg by mouth daily.   fluticasone  (FLONASE ) 50 MCG/ACT nasal spray Place 1 spray into both nostrils 2 (two) times daily.   furosemide   (LASIX ) 40 MG tablet Take 0.5 tablets (20 mg total) by mouth daily.   gabapentin  (NEURONTIN ) 300 MG capsule Take 300 mg by mouth 3 (three) times daily.   HYDROcodone -acetaminophen  (NORCO) 10-325 MG tablet Take 1 tablet by mouth every 4 (four) hours as needed (3-4 times PRN).   lansoprazole  (PREVACID ) 30 MG capsule TAKE 1 CAPSULE BY MOUTH 1 TO 2 TIMES DAILY BEFORE A MEAL   leflunomide  (ARAVA ) 10 MG tablet Take 10 mg by mouth daily.   MAGNESIUM -OXIDE 400 (240 Mg) MG tablet TAKE 1 TABLET(400 MG) BY MOUTH DAILY   meclizine (ANTIVERT) 12.5 MG tablet Take 12.5 mg by mouth 3 (three) times daily as needed for dizziness.   montelukast  (SINGULAIR ) 10 MG tablet Take 10 mg by mouth at bedtime.   Multiple Vitamins-Minerals (OCUVITE ADULT 50+ PO) Take 1 tablet by mouth daily.   nitroGLYCERIN  (NITROSTAT ) 0.4 MG SL tablet DISSOLVE 1 TABLET UNDER THE  TONGUE EVERY 5 MINUTES AS NEEDED FOR CHEST PAIN. MAX OF 3 TABLETS IN 15 MINUTES. CALL 911 IF PAIN  PERSISTS.   sacubitril -valsartan  (ENTRESTO ) 24-26 MG Take 1 tablet by mouth 2 (two) times daily.   spironolactone  (ALDACTONE ) 25 MG tablet Take 1 tablet (25 mg total) by mouth daily.   TRELEGY ELLIPTA 200-62.5-25 MCG/ACT AEPB Inhale 1 puff into the lungs daily.   VASCEPA  1 g capsule TAKE 1 CAPSULE(1 GRAM) BY MOUTH TWICE DAILY   No facility-administered encounter medications on file as of 03/28/2024.   Physical Exam: Blood pressure (!) 124/54, pulse 68, height 5\' 2"  (1.575 m), weight 127 lb (57.6 kg), SpO2 99%. Gen:      No acute distress HEENT:  EOMI, sclera anicteric Neck:     No masses; no thyromegaly Lungs:    Clear to auscultation bilaterally; normal respiratory effort CV:         Regular rate and rhythm; no murmurs Abd:      + bowel sounds; soft, non-tender; no palpable masses, no distension Ext:    No edema; adequate peripheral perfusion Skin:      Warm and dry; no rash Neuro: alert and oriented x 3 Psych: normal mood and affect   Data  Reviewed: Imaging: CTA 10/05/2022-no PE, irregular nodule in the right upper lobe, peribronchovascular nodularity  Chest x-ray 02/18/2023-improving left basal airspace disease.  CT high-resolution 05/29/2023-no interstitial lung disease, ill-defined nodularity and groundglass in the right upper lobe  CT chest 12/07/2023-improvement in right upper lobe peribronchovascular nodularity, mild scarring in the lingula. I have reviewed reviewed the images personally.  PFTs: 06/06/2023 FVC 2.04 [77%], FEV1 1.67 [84%], F/F82, TLC 3.92 [81%], DLCO 14.83 [81%] Normal test, variability in flow loop  Labs:  Sleep: Overnight oximetry on room air dated 04/12/2023 Time off test 9 hours 26 minutes Nadir O2 sat of 82% Time spent less than 88% 5 hours 5 minutes  Assessment & Plan Post-inflammatory pulmonary scarring Post-COVID multilobar pneumonia, Pseudomonas bacteremia, sepsis in 2024 Mild post-inflammatory scarring on CT scan, residual from previous pneumonia, with no significant impact on lung function. No interstitial lung disease related to rheumatoid arthritis.  Prior PFTs in 2024 showed normal lung function.   Rheumatoid arthritis Diagnosed 8 years ago, managed by Dr. Dannie Duval. Currently on Arava . No interstitial lung disease on CT scan.  Continue monitoring.  Nocturnal hypoxia Overnight oximetry with low oxygen  levels at night.  Continue supplemental oxygen  at night  Knee joint dislocation Significant swelling requiring knee replacement surgery.   Peri-operative Assessment of Pulmonary Risk for Non-Thoracic Surgery:  ForMs. Dolph Friar, risk of perioperative pulmonary complications is increased by:  Age greater than 65 years  Mild Interstitial lung disease   Respiratory complications generally occur in 1% of ASA Class I patients, 5% of ASA Class II and 10% of ASA Class III-IV patients These complications rarely result in mortality and iclude postoperative pneumonia, atelectasis, pulmonary  embolism, ARDS and increased time requiring postoperative mechanical ventilation.  Per ARISCAT criteria she is at low risk with estimated 1.6% pulmonary complication rate  Overall, I recommend proceeding with the surgery if the risk for respiratory complications are outweighed by the potential benefits. This will need to be discussed between the patient and surgeon.  To reduce risks of respiratory complications, I recommend: --Pre- and post-operative incentive spirometry performed frequently while awake --Avoiding use of pancuronium during anesthesia.  I have discussed the risk factors and recommendations above with the patient.     Plan/Recommendations: Continue supplemental oxygen  at night Cleared for orthopedic knee surgery  Phyllis Breeze MD [facility] 03/28/2024, 10:25 AM  CC: Victorio Grave, MD

## 2024-03-31 ENCOUNTER — Telehealth: Payer: Self-pay

## 2024-03-31 DIAGNOSIS — E875 Hyperkalemia: Secondary | ICD-10-CM

## 2024-03-31 DIAGNOSIS — I5022 Chronic systolic (congestive) heart failure: Secondary | ICD-10-CM

## 2024-03-31 NOTE — Telephone Encounter (Addendum)
 Pt verbalized understanding. She states she will return to [facility] for lab work the week of 5/26. BMET order placed.   ----- Message from Peder Bourdon sent at 03/30/2024 11:59 PM EDT ----- Follow low K diet, repeat BMET 2 wks.

## 2024-04-01 ENCOUNTER — Ambulatory Visit: Payer: Self-pay | Admitting: Pulmonary Disease

## 2024-04-01 ENCOUNTER — Telehealth: Payer: Self-pay | Admitting: Pulmonary Disease

## 2024-04-01 NOTE — Telephone Encounter (Signed)
 Rc'd [employer] for POC. Sending to Dr. Waylan Haggard for signature.

## 2024-04-01 NOTE — Telephone Encounter (Signed)
 Per Dr. Charline Contras note from 03/17/24, "  She is reasonably well-compensated in terms of her CHF.  I think she would be of reasonable risk to undergo TKR from cardiac perspective.  Would consider pulmonary evaluation prior to surgery, however."  OK to hold ASA 7 days before surgery, resume as soon a sssafe to do so post-op   COPY OF CLEARANCE FAXED OVER TO REQUESTING OFFICE VIA EPIC FAX FUNCTION

## 2024-04-02 NOTE — Telephone Encounter (Signed)
 Also, got a fax req office notes but it was fax'd back from [facility] saying it was not their pt. When faxg CMN signed by Dr. Annah Khat notes.

## 2024-04-02 NOTE — Therapy (Deleted)
 OUTPATIENT OCCUPATIONAL THERAPY LOW VISION TREATMENT  Patient Name: Katherine Larson MRN: [id number] DOB:[birth year], 77 y.o., female Today's Date: 04/02/2024  PCP: Victorio Grave, MD  REFERRING PROVIDER: [facility] - Dr. Carloyn Chi  END OF SESSION:   Past Medical History:  Diagnosis Date   Anemia    anemia of chronic disease +/- IDA followed by Dr. Liam Redhead. received iron  infusions in the past   Asthma    Borderline glaucoma    CAD (coronary artery disease)    a. s/p CABG 1996. b. low risk nuc 2011. c. LHC 04/2016 due to drop in EF -> occluded native LAD,  widely patent sequential LIMA-D1-LAD.   Chronic kidney disease    "mild stage of kidney disease"   Chronic systolic CHF (congestive heart failure) (HCC)    Complication of anesthesia    DM2 (diabetes mellitus, type 2) (HCC)    not on any medicine for this at this time, 05/2016   Dyspnea    with exertion   GERD (gastroesophageal reflux disease)    Gout    History of blood transfusion    History of hiatal hernia    in 20s   History of pneumonia    March, 2016   HLD (hyperlipidemia)    HTN (hypertension)    Hyperthyroidism 01/21/2016   Inappropriate sinus tachycardia (HCC)    LBBB (left bundle branch block)    a. Seen in 04/2016   Lymphocytic colitis 04/2020   Macular degeneration    left   Myocardial infarction [facility])    1996   Neuropathy    Neuropathy    NICM (nonischemic cardiomyopathy) (HCC)    a. Dx 04/2016 - EF 25-30%, diffuse HK, elevated LVEDP, mild MR, mod LAE.   Normocytic anemia 10/17/2021   NSVT (nonsustained ventricular tachycardia) (HCC)    PAT (paroxysmal atrial tachycardia) (HCC)    Pneumonia    PONV (postoperative nausea and vomiting)    "after just about every surgery I've had"   Rheumatoid arthritis (HCC)    RA- hands   Seasonal allergies    Past Surgical History:  Procedure Laterality Date   ABDOMINAL HYSTERECTOMY     APPENDECTOMY     BACK SURGERY     BIOPSY  04/27/2020    Procedure: BIOPSY;  Surgeon: Suzette Espy, MD;  [facility];  Service: Endoscopy;;  ascending colon biopsy   CARDIAC CATHETERIZATION N/A 05/01/2016   Procedure: Right/Left Heart Cath and Coronary/Graft Angiography;  Surgeon: Arleen Lacer, MD;  Location: [facility] INVASIVE CV LAB;  Service: Cardiovascular;  Laterality: N/A;   CHOLECYSTECTOMY     COLONOSCOPY  11/2011   Dr. Magod:ext/int hemorrhoids, diverticulosis sigmoid colon and distal desc colon, mid-desc colon hyperplastic polyps.    COLONOSCOPY WITH PROPOFOL  N/A 04/27/2020   Rourk: Diverticulosis, hemorrhoids, normal terminal ileum.  Random colon biopsies significant for chronic lymphocytic colitis. No repeat due to age.   CORONARY ARTERY BYPASS GRAFT  1996   2 vessels   ESOPHAGOGASTRODUODENOSCOPY  11/2011   Dr. Lavaughn [place]: small hiatal hernia, one non-bleeding superficial gastric ulcer, medium-sized diverticulum in area of papilla   ESOPHAGOGASTRODUODENOSCOPY N/A 12/29/2015   Dr. Riley Cheadle: Chronic inactive gastritis, normal esophagus status post dilation, duodenal diverticula   ESOPHAGOGASTRODUODENOSCOPY (EGD) WITH PROPOFOL  N/A 07/24/2019   Dr. Riley Cheadle: Normal esophagus status post empiric dilation, small hiatal hernia, large D2 diverticulum   ESOPHAGOGASTRODUODENOSCOPY (EGD) WITH PROPOFOL  N/A 06/03/2021   Surgeon: Suzette Espy, MD; normal esophagus, normal stomach, normal duodenum.  Abnormal hypopharyngeal  mucosa bilaterally just distal to the base of the tongue (right side greater than left), overlying mucosa appeared irregular and somewhat verrucous in appearance.  Recommended ENT evaluation.   EYE SURGERY Bilateral    cataract removal   FOOT SURGERY     removal bone spur   FRACTURE SURGERY Right    arm   GIVENS CAPSULE STUDY  11/2012   Dr. Lavaughn [place]: minimal gastritis, normal small bowel capsule   HERNIA REPAIR     hiatal hernia   ICD IMPLANT N/A 02/04/2021   Procedure: ICD IMPLANT;  Surgeon: Tammie Fall, MD;  Location: [facility]  INVASIVE CV LAB;  Service: Cardiovascular;  Laterality: N/A;   MALONEY DILATION N/A 07/24/2019   Procedure: Londa Rival DILATION;  Surgeon: Suzette Espy, MD;  [facility];  Service: Endoscopy;  Laterality: N/A;   MALONEY DILATION N/A 06/03/2021   Procedure: Londa Rival DILATION;  Surgeon: Suzette Espy, MD;  Location: [facility];  Service: Endoscopy;  Laterality: N/A;   MAXIMUM ACCESS (MAS)POSTERIOR LUMBAR INTERBODY FUSION (PLIF) 1 LEVEL N/A 08/14/2013   Procedure:  MAXIMUM ACCESS SURGERY(MAS) POSTERIOR LUMBAR INTERBODY FUSION LUMBAR THREE-FOUR ;  Surgeon: Isadora Mar, MD;  Location: [facility];  Service: Neurosurgery;  Laterality: N/A;   MAXIMUM ACCESS SURGERY(MAS) POSTERIOR LUMBAR INTERBODY FUSION LUMBAR THREE-FOUR    PTCA     RIGHT/LEFT HEART CATH AND CORONARY ANGIOGRAPHY N/A 03/26/2020   Procedure: RIGHT/LEFT HEART CATH AND CORONARY ANGIOGRAPHY;  Surgeon: Arleen Lacer, MD;  Location: [facility] INVASIVE CV LAB;  Service: Cardiovascular;  Laterality: N/A;   SPINAL CORD STIMULATOR BATTERY EXCHANGE N/A 01/19/2021   Procedure: Spinal cord stimulator battery change;  Surgeon: Annis Kinder, MD;  Location[facility] OR;  Service: Neurosurgery;  Laterality: N/A;   SPINAL CORD STIMULATOR INSERTION N/A 06/16/2016   Procedure: LUMBAR SPINAL CORD STIMULATOR INSERTION;  Surgeon: Gerri Kras, MD;  Location: [facility];  Service: Neurosurgery;  Laterality: N/A;  LUMBAR SPINAL CORD STIMULATOR INSERTION   TEE WITHOUT CARDIOVERSION N/A 02/01/2023   Procedure: TRANSESOPHAGEAL ECHOCARDIOGRAM (TEE);  Surgeon: Gerard Knight, MD;  Location: [facility];  Service: Cardiovascular;  Laterality: N/A;   tens  05/2016   TONSILLECTOMY     Patient Active Problem List   Diagnosis Date Noted   Bacteremia 02/01/2023   Pseudomonas sepsis (HCC) 01/30/2023   Acute respiratory failure with hypoxia (HCC) 01/28/2023   Multifocal pneumonia 01/28/2023   Melena 11/28/2022   Rectal bleeding 11/28/2022   Upper abdominal  pain 11/28/2022   Early satiety 07/27/2022   Nausea without vomiting 07/27/2022   Hypotension 02/10/2022   Normocytic anemia 10/17/2021   Lymphocytic colitis 07/02/2020   Coronary artery disease, occlusive: CTO proxLAD 03/26/2020   Abnormal nuclear stress test 03/26/2020   Chronic diarrhea 03/02/2020   NICM- new drop in EF 25-30% 05/03/2016   Acute pulmonary edema (HCC)    LBBB (left bundle branch block) 04/29/2016   Chronic HFrEF (heart failure with reduced ejection fraction) (HCC) 04/29/2016   Abdominal pain, chronic, epigastric 03/07/2016   Septic shock (HCC)    COPD exacerbation (HCC) 01/21/2016   Hyperthyroidism 01/21/2016   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   Hypokalemia 01/19/2016   Hyponatremia 01/19/2016   Diet-controlled diabetes mellitus (HCC) 01/19/2016   CAP (community acquired pneumonia) 01/18/2016   Mucosal abnormality of stomach    Dysphagia    Constipation 12/08/2015   Gastroesophageal reflux disease 12/08/2015   Abnormal CT scan, esophagus 12/08/2015   Esophageal dysphagia 12/08/2015   Abdominal pain, epigastric  12/08/2015   Loss of weight 12/08/2015   Lower abdominal pain 12/08/2015   Back pain 08/26/2015   Headache 08/26/2015   Essential hypertension 08/26/2015   DM type 2 (diabetes mellitus, type 2) (HCC) 08/26/2015   Sinus tachycardia 06/07/2015   Diarrhea 06/07/2015   Preop cardiovascular exam 07/16/2013   Atrial flutter (HCC) 07/16/2013   Anemia 01/08/2013   Leukocytosis 12/17/2012   DYSPNEA 02/02/2010   DIABETES MELLITUS 07/28/2009   Hyperlipidemia 07/28/2009   CHOLECYSTECTOMY, HX OF 07/28/2009   Hx of CABG '96- cath 05/01/16 07/28/2009   HERNIORRHAPHY, HX OF 07/28/2009    ONSET DATE: 12/18/2023 (Date of referral) - reports significant decline in vision over the last couple years  REFERRING DIAG: H35.30 (ICD-10-CM) - Macular degeneration   THERAPY DIAG:  No diagnosis found.  Rationale for Evaluation and Treatment:  Rehabilitation  SUBJECTIVE:   SUBJECTIVE STATEMENT:  She has been able to see her phone screen slightly better with the inverted text.   Her cardiologist cleared her for knee surgery.  She really likes to play Monopoly. She used to play Rummy with her husband.   Pt accompanied by: self and husband   PERTINENT HISTORY: PMH: DM II, CKD, CAD, hyperthyroidism, borderline glaucoma, AMD, spinal cord stimulator, RA, neuropathy in feet, MI (1996), CHF, HLD, HTN  PAIN:  Are you having pain? Yes: NPRS scale: 5/10 Pain location: R knee and back Pain description: excruciating; throbs Aggravating factors: getting up Relieving factors: positioning  FALLS: Has patient fallen in last 6 months? 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; retired Garment/textile technologist  PATIENT GOALS: be as independent as possible/do as much as she can  OBJECTIVE:   GLASSES: N/A  CURRENT MODIFICATIONS/ADAPTIONS: uses flashlight to see help read items  DEVICES: Husband reports 20 and 30 x magnifiers but pt unable to use these to read anymore  PHONE: does not know how to use; husband reports their daughter is in the process of getting the pt an iPhone  HAND DOMINANCE: Right  ADLs IADLs: Overall ADLs: mostly mod I with accommodations   Meal Prep: dependent  Handwriting: Not legible and unable  MOBILITY STATUS: Needs Assist: min A for sit to stand and min A to navigate using RW  FUNCTIONAL OUTCOME MEASURES: AM-PAC 6 Clicks for ADLs 1. Putting on and taking off regular lower body clothing?  A little  2. Bathing (including washing, rinsing, drying)? A little 3. Toileting, which includes using toilet, bedpan, or urinal?  A little 4. Putting on and taking off regular upper body  clothing?  A little 5. Personal grooming such as brushing teeth?  A little 6. Eating meals?  A little  RAW Score: 18/24 Functional Impairment: 47%  03/20/2024: PSFS1.5 total score   Total score = sum of the activity scores/number of activities Minimum detectable change (90%CI) for average score = 2 points Minimum detectable change (90%CI) for single activity score = 3 points  COGNITION: Overall cognitive status: Within functional limits for tasks assessed  VISUAL FINDINGS: Baseline vision: Legally blind Visual history: glaucoma and macular degeneration  Impaired Reading acuity: 20/320 - distance acuity per referral: CF @ 2' OU  OBSERVATIONS: Pt appears well-kept. Ambulates with use of RW and min to CGA, altered gait, but no LOB. No glasses donned.  TODAY'S TREATMENT:                                                                                                                               OT educated pt on table top play of Golf Solitaire for BUE ROM, coordination, visual processing, scanning, and sequencing. Pt required minimal cues for proper play. OT educated pt on use of large face playing cards and ability to take a visual break as needed if vision becomes blurry after playing for an extended period of time.   PATIENT EDUCATION: Education details: Manufacturing systems engineer Person educated: Patient Education method: Explanation  Education comprehension: verbalized understanding and needs further education  HOME EXERCISE PROGRAM: Not yet initiated   GOALS:  SHORT TERM GOALS: Target date: 03/21/2024     Patient will independently recall at least 2 compensatory strategies for visual impairment without cueing. Baseline: Goal status: INITIAL  2.  Patient will return demonstration of visual adaptation use and verbalize understanding of how to obtain item(s) if desired.  Baseline:  Goal status: INITIAL   LONG TERM GOALS: Target date: 05/16/24   Patient will report at least  two-point increase in average PSFS score or at least three-point increase in a single activity score indicating functionally significant improvement given minimum detectable change.  Baseline: 1.5 total score (See above for individual activity scores)  Goal status: INITIAL  ASSESSMENT:  CLINICAL IMPRESSION: Patient demonstrates good understanding of activity today though may require additional review as pt does not have an adapted deck of cards at home to practice. Pt may need to put therapy on hold depending on when she has knee surgery scheduled.   PERFORMANCE DEFICITS: in functional skills including ADLs, IADLs, pain, mobility, endurance, decreased knowledge of precautions, decreased knowledge of use of DME, and vision.   IMPAIRMENTS: are limiting patient from ADLs, IADLs, leisure, and social participation.   CO-MORBIDITIES: has co-morbidities such as CHF, CAD, HTN, DMII, and chronic pain that affects occupational performance. Patient will benefit from skilled OT to address above impairments and improve overall function.  REHAB POTENTIAL: Fair for goals stated  PLAN:  OT FREQUENCY: every other week  OT DURATION: 6 sessions  PLANNED INTERVENTIONS: self care/ADL training, therapeutic exercise, therapeutic activity, functional mobility training, patient/family education, visual/perceptual remediation/compensation, coping strategies training, DME and/or AE instructions, and Re-evaluation  RECOMMENDED OTHER SERVICES: [facility]  CONSULTED AND AGREED WITH PLAN OF CARE: Patient and husband  PLAN FOR NEXT SESSION: PSFS - update goal PRN; review golf solitaire; Go over low vision packet; [facility] status; iPhone status/modifications to current phone from last session   Altamease Asters, OT 04/02/2024, 5:04 PM

## 2024-04-03 ENCOUNTER — Ambulatory Visit: Admitting: Occupational Therapy

## 2024-04-04 ENCOUNTER — Telehealth: Payer: Self-pay | Admitting: *Deleted

## 2024-04-04 NOTE — Telephone Encounter (Signed)
 Copied from CRM [id number]. Topic: General - Other >> Apr 03, 2024 11:18 AM Crist Dominion wrote: Reason for CRM: [facility] will be faxing over a surgical clearance request for the 3rd time today to the alternative fax number provided to [id number].  Fax received from Dr. Lanetta Pion with [facility] to perform a right total knee arthroplasty on patient.  Patient needs surgery clearance. Surgery is pending. Patient was seen on 03/28/24. Office protocol is a risk assessment can be sent to surgeon if patient has been seen in 60 days or less.   Risk assessment 03/28/24 faxed to [facility] [phone]

## 2024-04-09 ENCOUNTER — Encounter: Payer: Self-pay | Admitting: Internal Medicine

## 2024-04-09 ENCOUNTER — Ambulatory Visit: Attending: Internal Medicine | Admitting: Internal Medicine

## 2024-04-09 VITALS — BP 112/76 | HR 67 | Ht 62.0 in

## 2024-04-09 DIAGNOSIS — I5022 Chronic systolic (congestive) heart failure: Secondary | ICD-10-CM

## 2024-04-09 LAB — CUP PACEART INCLINIC DEVICE CHECK
Battery Remaining Longevity: 90 mo
Brady Statistic RV Percent Paced: 0 %
Date Time Interrogation Session: 20250521104733
HighPow Impedance: 56.25 Ohm
Implantable Lead Connection Status: 753985
Implantable Lead Implant Date: 20220318
Implantable Lead Location: 753860
Implantable Pulse Generator Implant Date: 20220318
Lead Channel Impedance Value: 437.5 Ohm
Lead Channel Pacing Threshold Amplitude: 0.5 V
Lead Channel Pacing Threshold Amplitude: 0.5 V
Lead Channel Pacing Threshold Pulse Width: 0.5 ms
Lead Channel Pacing Threshold Pulse Width: 0.5 ms
Lead Channel Sensing Intrinsic Amplitude: 12 mV
Lead Channel Setting Pacing Amplitude: 2.5 V
Lead Channel Setting Pacing Pulse Width: 0.5 ms
Lead Channel Setting Sensing Sensitivity: 0.5 mV
Pulse Gen Serial Number: 810021948
Zone Setting Status: 755011

## 2024-04-09 NOTE — Patient Instructions (Signed)
 Medication Instructions:  Your physician recommends that you continue on your current medications as directed. Please refer to the Current Medication list given to you today.  *If you need a refill on your cardiac medications before your next appointment, please call your pharmacy*  Lab Work: You will need labwork within 30 days of the procedure  You may go to any [facility] Location for your lab work:  [facility] - [address] ([facility]) - [phone] [address]  [place] - [address]   [place]  - [address]   [place] - [address] - [address] [employer]  - [address] ([facility]   If you have labs (blood work) drawn today and your tests are completely normal, you will receive your results only by: [employer] (if you have MyChart)  If you have any lab test that is abnormal or we need to change your treatment, we will call you or send a MyChart message to review the results.  Testing/Procedures: Device upgrade dates: June 25 July 3, 10, 18, 28 August 7, 13  Follow-Up: At [facility], you and your health needs are our priority.  As part of our continuing mission to provide you with exceptional heart care, we have created designated Provider Care Teams.  These Care Teams include your primary Cardiologist (physician) and Advanced Practice Providers (APPs -  Physician Assistants and Nurse Practitioners) who all work together to provide you with the care you need, when you need it.  Your next appointment:   To be scheduled  The format for your next appointment:   In Person  Provider:   Manya Sells, MD{or one of the following Advanced Practice Providers on your designated Care Team:   Mertha Abrahams, [place] Bambi Lever "Jonelle Neri" [place], [place] Neda Balk, NP  Note: Remote monitoring is used to monitor your  Pacemaker/ ICD from home. This monitoring reduces the number of office visits required to check your device to one time per year. It allows us  to keep an eye on the functioning of your device to ensure it is working properly.

## 2024-04-09 NOTE — Progress Notes (Signed)
 HPI Katherine Larson returns today for followup. She is a pleasant 78 yo woman with an ICM, EF 35%, IVCD, who underwent ICD insertion 3 years ago. She has developed worsening CHF symptoms despite GDMT and is now class 3. She has developed LBBB with a QRS duration of 150 over the past few months with dysynchronous activation on her echo. She is referred to consider upgrade from a single chamber ICD to a biv ICD. She has not had any ICD therapies.  Allergies  Allergen Reactions   Biaxin [Clarithromycin] Rash and Other (See Comments)    Blisters in mouth    Penicillins Rash and Other (See Comments)    Blisters in mouth Has patient had a PCN reaction causing immediate rash, facial/tongue/throat swelling, SOB or lightheadedness with hypotension: Yesyes Has patient had a PCN reaction causing severe rash involving mucus membranes or skin necrosis: Nono Has patient had a PCN reaction that required hospitalization Nono Has patient had a PCN reaction occurring within the last 10 years: Nono If all of the above answers are "NO", then may proceed   Hydroxychloroquine  Other (See Comments)   Sulfamethoxazole     Other Reaction(s): Other (See Comments)   Sulfasalazine Other (See Comments)   Losartan  Potassium     Other reaction(s): elevated creatinine     Current Outpatient Medications  Medication Sig Dispense Refill   albuterol  (VENTOLIN  HFA) 108 (90 Base) MCG/ACT inhaler Inhale 1-2 puffs into the lungs every 6 (six) hours as needed for wheezing or shortness of breath. 18 g 0   allopurinol  (ZYLOPRIM ) 300 MG tablet Take 300 mg by mouth daily.     aspirin  EC 81 MG EC tablet Take 1 tablet (81 mg total) by mouth daily.     atorvastatin  (LIPITOR ) 80 MG tablet Take 80 mg by mouth daily.     carvedilol  (COREG ) 6.25 MG tablet Take 6.25 mg by mouth 2 (two) times daily with a meal.     cholecalciferol (VITAMIN D3) 25 MCG (1000 UT) tablet Take 1,000 Units by mouth daily.     dapagliflozin  propanediol  (FARXIGA ) 10 MG TABS tablet TAKE 1 TABLET(10 MG) BY MOUTH DAILY BEFORE BREAKFAST 30 tablet 10   etanercept (ENBREL SURECLICK) 50 MG/ML injection Inject 50 mg into the skin once a week. For 30 days     Evolocumab  (REPATHA  SURECLICK) 140 MG/ML SOAJ Inject 140 mg into the skin every 14 (fourteen) days. 2 mL 2   ferrous sulfate 325 (65 FE) MG tablet Take 325 mg by mouth daily with breakfast. Every other day     fexofenadine (ALLEGRA) 180 MG tablet Take 180 mg by mouth daily.     fluticasone  (FLONASE ) 50 MCG/ACT nasal spray Place 1 spray into both nostrils 2 (two) times daily.     furosemide  (LASIX ) 40 MG tablet Take 0.5 tablets (20 mg total) by mouth daily. 45 tablet 3   gabapentin  (NEURONTIN ) 300 MG capsule Take 300 mg by mouth 3 (three) times daily.     HYDROcodone -acetaminophen  (NORCO) 10-325 MG tablet Take 1 tablet by mouth every 4 (four) hours as needed (3-4 times PRN).     lansoprazole  (PREVACID ) 30 MG capsule TAKE 1 CAPSULE BY MOUTH 1 TO 2 TIMES DAILY BEFORE A MEAL 60 capsule 5   leflunomide  (ARAVA ) 10 MG tablet Take 10 mg by mouth daily.     MAGNESIUM -OXIDE 400 (240 Mg) MG tablet TAKE 1 TABLET(400 MG) BY MOUTH DAILY 30 tablet 2   meclizine (ANTIVERT) 12.5 MG  tablet Take 12.5 mg by mouth 3 (three) times daily as needed for dizziness.     montelukast  (SINGULAIR ) 10 MG tablet Take 10 mg by mouth at bedtime.     Multiple Vitamins-Minerals (OCUVITE ADULT 50+ PO) Take 1 tablet by mouth daily.     nitroGLYCERIN  (NITROSTAT ) 0.4 MG SL tablet DISSOLVE 1 TABLET UNDER THE  TONGUE EVERY 5 MINUTES AS NEEDED FOR CHEST PAIN. MAX OF 3 TABLETS IN 15 MINUTES. CALL 911 IF PAIN  PERSISTS. 75 tablet 4   sacubitril -valsartan  (ENTRESTO ) 24-26 MG Take 1 tablet by mouth 2 (two) times daily. 60 tablet 6   spironolactone  (ALDACTONE ) 25 MG tablet Take 1 tablet (25 mg total) by mouth daily. 90 tablet 3   TRELEGY ELLIPTA 200-62.5-25 MCG/ACT AEPB Inhale 1 puff into the lungs daily.     VASCEPA  1 g capsule TAKE 1 CAPSULE(1  GRAM) BY MOUTH TWICE DAILY 120 capsule 10   No current facility-administered medications for this visit.     Past Medical History:  Diagnosis Date   Anemia    anemia of chronic disease +/- IDA followed by Dr. Liam Redhead. received iron  infusions in the past   Asthma    Borderline glaucoma    CAD (coronary artery disease)    a. s/p CABG 1996. b. low risk nuc 2011. c. LHC 04/2016 due to drop in EF -> occluded native LAD,  widely patent sequential LIMA-D1-LAD.   Chronic kidney disease    "mild stage of kidney disease"   Chronic systolic CHF (congestive heart failure) (HCC)    Complication of anesthesia    DM2 (diabetes mellitus, type 2) (HCC)    not on any medicine for this at this time, 05/2016   Dyspnea    with exertion   GERD (gastroesophageal reflux disease)    Gout    History of blood transfusion    History of hiatal hernia    in 20s   History of pneumonia    March, 2016   HLD (hyperlipidemia)    HTN (hypertension)    Hyperthyroidism 01/21/2016   Inappropriate sinus tachycardia (HCC)    LBBB (left bundle branch block)    a. Seen in 04/2016   Lymphocytic colitis 04/2020   Macular degeneration    left   Myocardial infarction [facility])    1996   Neuropathy    Neuropathy    NICM (nonischemic cardiomyopathy) (HCC)    a. Dx 04/2016 - EF 25-30%, diffuse HK, elevated LVEDP, mild MR, mod LAE.   Normocytic anemia 10/17/2021   NSVT (nonsustained ventricular tachycardia) (HCC)    PAT (paroxysmal atrial tachycardia) (HCC)    Pneumonia    PONV (postoperative nausea and vomiting)    "after just about every surgery I've had"   Rheumatoid arthritis (HCC)    RA- hands   Seasonal allergies     ROS:   All systems reviewed and negative except as noted in the HPI.   Past Surgical History:  Procedure Laterality Date   ABDOMINAL HYSTERECTOMY     APPENDECTOMY     BACK SURGERY     BIOPSY  04/27/2020   Procedure: BIOPSY;  Surgeon: Suzette Espy, MD;  Location: [facility];  Service:  Endoscopy;;  ascending colon biopsy   CARDIAC CATHETERIZATION N/A 05/01/2016   Procedure: Right/Left Heart Cath and Coronary/Graft Angiography;  Surgeon: Arleen Lacer, MD;  Location: [name] INVASIVE CV LAB;  Service: Cardiovascular;  Laterality: N/A;   CHOLECYSTECTOMY     COLONOSCOPY  11/2011  Dr. Magod:ext/int hemorrhoids, diverticulosis sigmoid colon and distal desc colon, mid-desc colon hyperplastic polyps.    COLONOSCOPY WITH PROPOFOL  N/A 04/27/2020   Rourk: Diverticulosis, hemorrhoids, normal terminal ileum.  Random colon biopsies significant for chronic lymphocytic colitis. No repeat due to age.   CORONARY ARTERY BYPASS GRAFT  1996   2 vessels   ESOPHAGOGASTRODUODENOSCOPY  11/2011   Dr. Lavaughn [place]: small hiatal hernia, one non-bleeding superficial gastric ulcer, medium-sized diverticulum in area of papilla   ESOPHAGOGASTRODUODENOSCOPY N/A 12/29/2015   Dr. Riley Cheadle: Chronic inactive gastritis, normal esophagus status post dilation, duodenal diverticula   ESOPHAGOGASTRODUODENOSCOPY (EGD) WITH PROPOFOL  N/A 07/24/2019   Dr. Riley Cheadle: Normal esophagus status post empiric dilation, small hiatal hernia, large D2 diverticulum   ESOPHAGOGASTRODUODENOSCOPY (EGD) WITH PROPOFOL  N/A 06/03/2021   Surgeon: Suzette Espy, MD; normal esophagus, normal stomach, normal duodenum.  Abnormal hypopharyngeal mucosa bilaterally just distal to the base of the tongue (right side greater than left), overlying mucosa appeared irregular and somewhat verrucous in appearance.  Recommended ENT evaluation.   EYE SURGERY Bilateral    cataract removal   FOOT SURGERY     removal bone spur   FRACTURE SURGERY Right    arm   GIVENS CAPSULE STUDY  11/2012   Dr. Lavaughn [place]: minimal gastritis, normal small bowel capsule   HERNIA REPAIR     hiatal hernia   ICD IMPLANT N/A 02/04/2021   Procedure: ICD IMPLANT;  Surgeon: Tammie Fall, MD;  Location: [facility] INVASIVE CV LAB;  Service: Cardiovascular;  Laterality: N/A;   MALONEY DILATION N/A  07/24/2019   Procedure: Londa Rival DILATION;  Surgeon: Suzette Espy, MD;  Location: [facility];  Service: Endoscopy;  Laterality: N/A;   MALONEY DILATION N/A 06/03/2021   Procedure: Londa Rival DILATION;  Surgeon: Suzette Espy, MD;  Location: [facility];  Service: Endoscopy;  Laterality: N/A;   MAXIMUM ACCESS (MAS)POSTERIOR LUMBAR INTERBODY FUSION (PLIF) 1 LEVEL N/A 08/14/2013   Procedure:  MAXIMUM ACCESS SURGERY(MAS) POSTERIOR LUMBAR INTERBODY FUSION LUMBAR THREE-FOUR ;  Surgeon: Isadora Mar, MD;  Location: [facility]UR[facility] ORS;  Service: Neurosurgery;  Laterality: N/A;   MAXIMUM ACCESS SURGERY(MAS) POSTERIOR LUMBAR INTERBODY FUSION LUMBAR THREE-FOUR    PTCA     RIGHT/LEFT HEART CATH AND CORONARY ANGIOGRAPHY N/A 03/26/2020   Procedure: RIGHT/LEFT HEART CATH AND CORONARY ANGIOGRAPHY;  Surgeon: Arleen Lacer, MD;  Location: [facility] INVASIVE CV LAB;  Service: Cardiovascular;  Laterality: N/A;   SPINAL CORD STIMULATOR BATTERY EXCHANGE N/A 01/19/2021   Procedure: Spinal cord stimulator battery change;  Surgeon: Annis Kinder, MD;  Location: [facility] OR;  Service: Neurosurgery;  Laterality: N/A;   SPINAL CORD STIMULATOR INSERTION N/A 06/16/2016   Procedure: LUMBAR SPINAL CORD STIMULATOR INSERTION;  Surgeon: Gerri Kras, MD;  Location: [facility];  Service: Neurosurgery;  Laterality: N/A;  LUMBAR SPINAL CORD STIMULATOR INSERTION   TEE WITHOUT CARDIOVERSION N/A 02/01/2023   Procedure: TRANSESOPHAGEAL ECHOCARDIOGRAM (TEE);  Surgeon: Gerard Knight, MD;  Location: [facility] ORS;  Service: Cardiovascular;  Laterality: N/A;   tens  05/2016   TONSILLECTOMY       Family History  Problem Relation Age of Onset   Heart attack Father    Heart attack Mother    Bladder Cancer Brother    Cervical cancer Daughter        ?   Colon cancer Neg Hx      Social History   Socioeconomic History   Marital status: Married    Spouse name: Not on file  Number of children: 3   Years of education: Not on file   Highest  education level: Not on file  Occupational History   Not on file  Tobacco Use   Smoking status: Former    Current packs/day: 0.00    Average packs/day: 0.8 packs/day for 15.0 years (11.3 ttl pk-yrs)    Types: Cigarettes    Start date: 11/21/1979    Quit date: 11/20/1994    Years since quitting: 29.4   Smokeless tobacco: Never   Tobacco comments:    Quit in 1996  Vaping Use   Vaping status: Never Used  Substance and Sexual Activity   Alcohol  use: Never    Alcohol /week: 0.0 standard drinks of alcohol    Drug use: Never   Sexual activity: Yes  Other Topics Concern   Not on file  Social History Narrative   2 living children, one deceased age 2   Right handed   One story home   Drinks coffee am   Social Drivers of Corporate investment banker Strain: Not on file  Food Insecurity: No Food Insecurity (01/28/2023)   Hunger Vital Sign    Worried About Running Out of Food in the Last Year: Never true    Ran Out of Food in the Last Year: Never true  Transportation Needs: No Transportation Needs (01/28/2023)   PRAPARE - Administrator, Civil Service (Medical): No    Lack of Transportation (Non-Medical): No  Physical Activity: Not on file  Stress: Not on file  Social Connections: Not on file  Intimate Partner Violence: Not At Risk (01/28/2023)   Humiliation, Afraid, Rape, and Kick questionnaire    Fear of Current or Ex-Partner: No    Emotionally Abused: No    Physically Abused: No    Sexually Abused: No     BP 112/76 Comment: unable to obtain  Pulse 67   Ht 5\' 2"  (1.575 m)   SpO2 96%   BMI 23.23 kg/m   Physical Exam:  Well appearing NAD HEENT: Unremarkable Neck:  No JVD, no thyromegally Lymphatics:  No adenopathy Back:  No CVA tenderness Lungs:  Clear with no wheezes HEART:  Regular rate rhythm with a split S2, no murmurs, no rubs, no clicks Abd:  soft, positive bowel sounds, no organomegally, no rebound, no guarding Ext:  2 plus pulses, no edema, no  cyanosis, no clubbing Skin:  No rashes no nodules Neuro:  CN II through XII intact, motor grossly intact  EKG - reviewed. NSR with LBBB  DEVICE  Normal device function.  See PaceArt for details.   Assess/Plan: Class 3 CHF with LBBB, QRS 150 - I have discussed the treatment options with the patient and recommend proceeding with upgrade to a biv ICD. I have reviewed the indications/risks/benefits/goals/expectations and she would like to proceed. ICD -her St. Jude device is working normally.  No therapies.   Pete Brand Saraya Tirey,MD

## 2024-04-10 ENCOUNTER — Other Ambulatory Visit: Payer: Self-pay | Admitting: Family Medicine

## 2024-04-10 DIAGNOSIS — R921 Mammographic calcification found on diagnostic imaging of breast: Secondary | ICD-10-CM

## 2024-04-11 ENCOUNTER — Other Ambulatory Visit (HOSPITAL_COMMUNITY): Payer: Self-pay | Admitting: Cardiology

## 2024-04-13 ENCOUNTER — Other Ambulatory Visit: Payer: Self-pay | Admitting: Gastroenterology

## 2024-04-13 DIAGNOSIS — K219 Gastro-esophageal reflux disease without esophagitis: Secondary | ICD-10-CM

## 2024-04-15 ENCOUNTER — Ambulatory Visit: Attending: Internal Medicine

## 2024-04-15 DIAGNOSIS — Z95810 Presence of automatic (implantable) cardiac defibrillator: Secondary | ICD-10-CM

## 2024-04-15 DIAGNOSIS — I5022 Chronic systolic (congestive) heart failure: Secondary | ICD-10-CM

## 2024-04-15 NOTE — Progress Notes (Signed)
 [facility] Progress Note    PCP: Victorio Grave, MD Cardiology: Dr. Stann Earnest HF Cardiology: Dr. Mitzie Anda  Reason for Visit: f/u for chronic systolic heart failure  78 y.o. with history of CAD s/p CABG and ischemic cardiomyopathy was referred by Dr. Stann Earnest for evaluation of CHF.  Patient has a long h/o heart disease.  In 1996, she had CABG with sequential LIMA-LAD and diagonal.  Last cath in 5/21 showed totally occluded LAD with patent LIMA-LAD and diagonal, minimal disease in LCx and RCA. LV systolic function has been reduced.  She has a Secondary school teacher ICD.  Last echo in 9/22 showed EF 25-30%, low normal RV function, moderate MR.  She has a chronic LBBB.  She additionally has rheumatoid arthritis and significant low back pain for which she has a spinal stimulator.   She tried to take Entresto  in the past and was very tired/fatigued on it.  She was unable to tolerate it even at low dose and stopped it.  She was lightheaded when we tried to increase Coreg  to 12.5 mg bid.   Follow up 1/24, NYHA II and volume stable. 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 ID and Cardiology consulted, and underwent TEE, showing LVEF 30-35%, normal RV, no LAA thrombus, no valvular or ICD vegetations. She was continued on 2 weeks of ciprofloxacin .  Echo in 4/25 showed EF 35-40%, normal RV.   Recently seen by Dr. Mitzie Anda 03/17/24. Now walking much due to knee pain, pending TKR.  She reported SOB getting dressed and w/ and other forms of moderate exertion, though felt to be more limited by COPD and deconditioning than HF, as she was felt to be euvolemic on exam.   She was re-trialed on Entresto . Started on 24-16 mg bid dose. Losartan  discontinued and lasix  decreased to 20 mg daily. She was referred to Lipid clinic for  initiation of Repatha  given elevated LDL at 122 mg/dL. She was also referred back to EP to be considered for device upgrade to CRT-D given dyssynchrony noted on echo + LBBB w/ QRS > 150 ms on EKG. In regards to surgical risk assessment for TKR, she was felt to be reasonable risk to undergo TKR from cardiac perspective.  On 5/9, she had f/u BMP after transitioning to Entresto . This demonstrated mild hyperkalemia, K 5.3. Scr was ok at 1.11. She was instructed to reduce dietary intake of K rich foods and to repeat BMP at next visit.   Of note, she was seen by Dr. Carolynne Citron last wk, on 5/21. Decision made to proceed w/ BiV device upgrade. Pt willing to proceed. This has not been scheduled yet.   She presents back today for f/u of heart failure.        ECG: ***  Labs (11/22): K 4.9, creatinine 0.95 Labs (2/23): K 4.7, creatinine 1.06 Labs (4/23): K 4.5, creatinine 1.21, LDL 45, HDL 27, TGs 238 Labs (7/23): LDL 42, TGs 200 Labs (1/24): K 4.4, creatinine 0.81, hgb 10.8 Labs (3/24): K 4.2, creatinine 0.95 Labs (7/24): K 4.4, creatinine 0.98 Labs (2/25): K 4.4, creatinine 1.04 Labs (4/25): hgb 10.4, LDL 122  Labs (5/25): K 5.3, creatinine 1.11  PMH: 1. HTN 2. Hyperlipidemia 3. GERD 4. Gout 5. Fe deficiency anemia 6. CAD: CABG in 1996 with sequential LIMA-diagonal and LAD.  -  LHC (5/21): totally occluded LAD with patent sequent LIMA-D and LAD, no significant LCx or RCA disease.  7. Chronic systolic CHF: Ischemic cardiomyopathy.  St Jude ICD.  - Echo (9/22): EF 25-30%, low normal RV function, moderate MR.  - Echo (2/24): EF 30-35%, normal RV - TEE (3/24): LVEF 30-35%, normal RV, no LAA thrombus, no valvular or ICD vegetations. - Echo (4/25): EF 35-40%, normal RV 8. Low back pain: Arthritis, has spinal stimulator.  9. Chronic LBBB 10. Rheumatoid arthritis.  11. COPD: Prior smoker.   Social History   Socioeconomic History   Marital status: Married    Spouse name: Not on file    Number of children: 3   Years of education: Not on file   Highest education level: Not on file  Occupational History   Not on file  Tobacco Use   Smoking status: Former    Current packs/day: 0.00    Average packs/day: 0.8 packs/day for 15.0 years (11.3 ttl pk-yrs)    Types: Cigarettes    Start date: 11/21/1979    Quit date: 11/20/1994    Years since quitting: 29.4   Smokeless tobacco: Never   Tobacco comments:    Quit in 1996  Vaping Use   Vaping status: Never Used  Substance and Sexual Activity   Alcohol  use: Never    Alcohol /week: 0.0 standard drinks of alcohol    Drug use: Never   Sexual activity: Yes  Other Topics Concern   Not on file  Social History Narrative   2 living children, one deceased age 79   Right handed   One story home   Drinks coffee am   Social Drivers of Corporate investment banker Strain: Not on file  Food Insecurity: No Food Insecurity (01/28/2023)   Hunger Vital Sign    Worried About Running Out of Food in the Last Year: Never true    Ran Out of Food in the Last Year: Never true  Transportation Needs: No Transportation Needs (01/28/2023)   PRAPARE - Administrator, Civil Service (Medical): No    Lack of Transportation (Non-Medical): No  Physical Activity: Not on file  Stress: Not on file  Social Connections: Not on file  Intimate Partner Violence: Not At Risk (01/28/2023)   Humiliation, Afraid, Rape, and Kick questionnaire    Fear of Current or Ex-Partner: No    Emotionally Abused: No    Physically Abused: No    Sexually Abused: No   Family History  Problem Relation Age of Onset   Heart attack Father    Heart attack Mother    Bladder Cancer Brother    Cervical cancer Daughter        ?   Colon cancer Neg Hx    ROS: All systems reviewed and negative except as per HPI.   Current Outpatient Medications  Medication Sig Dispense Refill   albuterol  (VENTOLIN  HFA) 108 (90 Base) MCG/ACT inhaler Inhale 1-2 puffs into the lungs every 6  (six) hours as needed for wheezing or shortness of breath. 18 g 0   allopurinol  (ZYLOPRIM ) 300 MG tablet Take 300 mg by mouth daily.     aspirin  EC 81 MG EC tablet Take 1 tablet (81 mg total) by mouth daily.     atorvastatin  (LIPITOR ) 80 MG tablet Take 80 mg by mouth daily.     carvedilol  (COREG ) 6.25 MG tablet Take 6.25 mg by mouth 2 (two) times daily with a meal.     cholecalciferol (VITAMIN  D3) 25 MCG (1000 UT) tablet Take 1,000 Units by mouth daily.     dapagliflozin  propanediol (FARXIGA ) 10 MG TABS tablet TAKE 1 TABLET(10 MG) BY MOUTH DAILY BEFORE BREAKFAST 30 tablet 10   etanercept (ENBREL SURECLICK) 50 MG/ML injection Inject 50 mg into the skin once a week. For 30 days     Evolocumab  (REPATHA  SURECLICK) 140 MG/ML SOAJ Inject 140 mg into the skin every 14 (fourteen) days. 2 mL 2   ferrous sulfate 325 (65 FE) MG tablet Take 325 mg by mouth daily with breakfast. Every other day     fexofenadine (ALLEGRA) 180 MG tablet Take 180 mg by mouth daily.     fluticasone  (FLONASE ) 50 MCG/ACT nasal spray Place 1 spray into both nostrils 2 (two) times daily.     furosemide  (LASIX ) 40 MG tablet Take 0.5 tablets (20 mg total) by mouth daily. 45 tablet 3   gabapentin  (NEURONTIN ) 300 MG capsule Take 300 mg by mouth 3 (three) times daily.     HYDROcodone -acetaminophen  (NORCO) 10-325 MG tablet Take 1 tablet by mouth every 4 (four) hours as needed (3-4 times PRN).     lansoprazole  (PREVACID ) 30 MG capsule TAKE 1 CAPSULE BY MOUTH 1 TO 2 TIMES DAILY BEFORE A MEAL 60 capsule 5   leflunomide  (ARAVA ) 10 MG tablet Take 10 mg by mouth daily.     MAGNESIUM -OXIDE 400 (240 Mg) MG tablet TAKE 1 TABLET(400 MG) BY MOUTH DAILY 30 tablet 2   meclizine (ANTIVERT) 12.5 MG tablet Take 12.5 mg by mouth 3 (three) times daily as needed for dizziness.     montelukast  (SINGULAIR ) 10 MG tablet Take 10 mg by mouth at bedtime.     Multiple Vitamins-Minerals (OCUVITE ADULT 50+ PO) Take 1 tablet by mouth daily.     nitroGLYCERIN   (NITROSTAT ) 0.4 MG SL tablet DISSOLVE 1 TABLET UNDER THE  TONGUE EVERY 5 MINUTES AS NEEDED FOR CHEST PAIN. MAX OF 3 TABLETS IN 15 MINUTES. CALL 911 IF PAIN  PERSISTS. 75 tablet 4   sacubitril -valsartan  (ENTRESTO ) 24-26 MG Take 1 tablet by mouth 2 (two) times daily. 60 tablet 6   spironolactone  (ALDACTONE ) 25 MG tablet Take 1 tablet (25 mg total) by mouth daily. 90 tablet 3   TRELEGY ELLIPTA 200-62.5-25 MCG/ACT AEPB Inhale 1 puff into the lungs daily.     VASCEPA  1 g capsule TAKE 1 CAPSULE(1 GRAM) BY MOUTH TWICE DAILY 120 capsule 10   No current facility-administered medications for this visit.   Wt Readings from Last 3 Encounters:  03/28/24 57.6 kg (127 lb)  03/26/24 58.8 kg (129 lb 9.6 oz)  03/17/24 56.7 kg (125 lb)   There were no vitals taken for this visit. PHYSICAL EXAM: General:  Well appearing. No respiratory difficulty HEENT: normal Neck: supple. no JVD. Carotids 2+ bilat; no bruits. No lymphadenopathy or thyromegaly appreciated. Cor: PMI nondisplaced. Regular rate & rhythm. No rubs, gallops or murmurs. Lungs: clear Abdomen: soft, nontender, nondistended. No hepatosplenomegaly. No bruits or masses. Good bowel sounds. Extremities: no cyanosis, clubbing, rash, edema Neuro: alert & oriented x 3, cranial nerves grossly intact. moves all 4 extremities w/o difficulty. Affect pleasant.   Assessment/Plan: 1. Chronic systolic CHF: Probably ischemic cardiomyopathy (h/o occluded LAD, s/p CABG). Coronaries have been unchanged since CABG in 1996. However, EF has declined over the years and I worry that another process may be present.  Unable to obtain cardiac MRI with spinal cord stimulator. 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 TEE (3/24) showed LVEF 30-35%, normal RV. Echo in 2/25 showed EF 35-40%, normal RV.  She is not volume overloaded on exam, NYHA class III symptoms (dyspnea with moderate  exertion).  I suspect that her symptoms are more due to COPD and deconditioning at this point than CHF.  - Continue Entresto  24-26 mg bid  - Continue spironolactone  25 mg daily.  ? Reduction to 12.5 mg daily if f/u BMP still shows hyperkalemia  - Continue Farxiga  10 mg daily.  - Continue Coreg  6.25 mg bid.  - w/ LBBB w/ QRS > 150 ms + dyssynchrony noted on prior echo, she has been referred back to EP and planning upgrade to CRT-D  2. CAD: CABG 1996.  Cath in 5/21 due to fall in EF showed totally occluded LAD, patent RCA and LCx, patent LIMA-LAD and diagonal.  No change, no interventional target.  Stable w/o CP.  - Continue ASA 81 daily.  - Continue atorvastatin  80 mg daily. Has been referred to Lipid clinic for addition of Repatha   3. Chronic Respiratory Failure with hypoxia: Admission (3/24) for multilobar PNA, BCx + pseudomonas aeruginosa. Required BiPap and eventually discharged with home O2. CXR suggestive of chronic emphysema and scarring. Of note, had COVID 2/24. Using oxygen  at night.  4. Rheumatoid arthritis: Severe knee arthritis with intractable pain significantly limiting mobility.  Recent echo with slightly improved EF, 35-40%.  She is reasonably well-compensated in terms of her CHF.  I think she would be of reasonable risk to undergo TKR from cardiac perspective.  Would consider pulmonary evaluation prior to surgery, however.  5. HLD: LDL goal < 55. Recent LP (4/25) showed elevated LDL at 122. TG 209 despite Vascepa .  - continue Atorva 80 + Vascepa  - she has been referred to Lipid clinic for initiation of Repatha    F/u ***  Ruddy Corral, PA-C  04/15/2024

## 2024-04-16 ENCOUNTER — Telehealth (HOSPITAL_COMMUNITY): Payer: Self-pay

## 2024-04-16 NOTE — Telephone Encounter (Signed)
 Called to confirm/remind patient of their appointment at the [facility] on 04/17/24.   Appointment:   [x] Confirmed  [] Left mess   [] No answer/No voice mail  [] VM Full/unable to leave message  [] Phone not in service  Patient reminded to bring all medications and/or complete list.  Confirmed patient has transportation. Gave directions, instructed to utilize valet parking.

## 2024-04-17 ENCOUNTER — Ambulatory Visit: Admitting: Occupational Therapy

## 2024-04-17 ENCOUNTER — Ambulatory Visit (HOSPITAL_COMMUNITY)
Admission: RE | Admit: 2024-04-17 | Discharge: 2024-04-17 | Disposition: A | Discharge status: Home / Self Care | Source: Ambulatory Visit | Attending: Cardiology | Admitting: Cardiology

## 2024-04-17 ENCOUNTER — Ambulatory Visit (HOSPITAL_COMMUNITY): Payer: Self-pay | Admitting: Cardiology

## 2024-04-17 VITALS — BP 120/68 | HR 68 | Wt 128.4 lb

## 2024-04-17 DIAGNOSIS — R41842 Visuospatial deficit: Secondary | ICD-10-CM

## 2024-04-17 DIAGNOSIS — H548 Legal blindness, as defined in USA: Secondary | ICD-10-CM | POA: Diagnosis not present

## 2024-04-17 DIAGNOSIS — M545 Low back pain, unspecified: Secondary | ICD-10-CM | POA: Insufficient documentation

## 2024-04-17 DIAGNOSIS — I2510 Atherosclerotic heart disease of native coronary artery without angina pectoris: Secondary | ICD-10-CM | POA: Insufficient documentation

## 2024-04-17 DIAGNOSIS — I447 Left bundle-branch block, unspecified: Secondary | ICD-10-CM | POA: Diagnosis not present

## 2024-04-17 DIAGNOSIS — J9611 Chronic respiratory failure with hypoxia: Secondary | ICD-10-CM | POA: Diagnosis not present

## 2024-04-17 DIAGNOSIS — Z79899 Other long term (current) drug therapy: Secondary | ICD-10-CM | POA: Diagnosis not present

## 2024-04-17 DIAGNOSIS — M069 Rheumatoid arthritis, unspecified: Secondary | ICD-10-CM | POA: Diagnosis not present

## 2024-04-17 DIAGNOSIS — I110 Hypertensive heart disease with heart failure: Secondary | ICD-10-CM | POA: Diagnosis not present

## 2024-04-17 DIAGNOSIS — Z8616 Personal history of COVID-19: Secondary | ICD-10-CM | POA: Insufficient documentation

## 2024-04-17 DIAGNOSIS — Z8249 Family history of ischemic heart disease and other diseases of the circulatory system: Secondary | ICD-10-CM | POA: Insufficient documentation

## 2024-04-17 DIAGNOSIS — Z951 Presence of aortocoronary bypass graft: Secondary | ICD-10-CM | POA: Insufficient documentation

## 2024-04-17 DIAGNOSIS — E785 Hyperlipidemia, unspecified: Secondary | ICD-10-CM | POA: Diagnosis not present

## 2024-04-17 DIAGNOSIS — I5022 Chronic systolic (congestive) heart failure: Secondary | ICD-10-CM | POA: Diagnosis not present

## 2024-04-17 DIAGNOSIS — J449 Chronic obstructive pulmonary disease, unspecified: Secondary | ICD-10-CM | POA: Diagnosis not present

## 2024-04-17 LAB — BASIC METABOLIC PANEL WITH GFR
Anion gap: 8 (ref 5–15)
BUN: 32 mg/dL — ABNORMAL HIGH (ref 8–23)
CO2: 27 mmol/L (ref 22–32)
Calcium: 9.3 mg/dL (ref 8.9–10.3)
Chloride: 101 mmol/L (ref 98–111)
Creatinine, Ser: 1.25 mg/dL — ABNORMAL HIGH (ref 0.44–1.00)
GFR, Estimated: 45 mL/min — ABNORMAL LOW (ref >60)
Glucose, Bld: 108 mg/dL — ABNORMAL HIGH (ref 70–99)
Potassium: 5.5 mmol/L — ABNORMAL HIGH (ref 3.5–5.1)
Sodium: 136 mmol/L (ref 135–145)

## 2024-04-17 LAB — BRAIN NATRIURETIC PEPTIDE: B Natriuretic Peptide: 161.7 pg/mL — ABNORMAL HIGH (ref 0.0–100.0)

## 2024-04-17 NOTE — Patient Instructions (Addendum)
 Good to see you today!  No medication changes  Labs done today, your results will be available in MyChart, we will contact you for abnormal readings.  You have been referred to Lipid clinic they will call  to schedule an appointment  Your physician recommends that you schedule a follow-up appointment :3 months(August) Call office in June to schedule an appointment  If you have any questions or concerns before your next appointment please send us  a message through [place] or call our office at [phone].    TO LEAVE A MESSAGE FOR THE NURSE SELECT OPTION 2, PLEASE LEAVE A MESSAGE INCLUDING: YOUR NAME DATE OF BIRTH CALL BACK NUMBER REASON FOR CALL**this is important as we prioritize the call backs  YOU WILL RECEIVE A CALL BACK THE SAME DAY AS LONG AS YOU CALL BEFORE 4:00 PM At the [facility], you and your health needs are our priority. As part of our continuing mission to provide you with exceptional heart care, we have created designated Provider Care Teams. These Care Teams include your primary Cardiologist (physician) and Advanced Practice Providers (APPs- Physician Assistants and Nurse Practitioners) who all work together to provide you with the care you need, when you need it.   You may see any of the following providers on your designated Care Team at your next follow up: Dr Jules Oar Dr Peder Bourdon Dr. Alwin Baars Dr. Arta Lark Amy Marijane Shoulders, NP Ruddy Corral, [place] [facility] [place], [place] Dennise Fitz, NP [place] Lee, NP Shawnee Dellen, NP Luster Salters, PharmD Bevely Brush, PharmD   Please be sure to bring in all your medications bottles to every appointment.    Thank you for choosing [facility]

## 2024-04-17 NOTE — Progress Notes (Signed)
 EPIC Encounter for ICM Monitoring  Patient Name: Katherine Larson is a 78 y.o. female Date: 04/17/2024 Primary Care Physican: Victorio Grave, MD Primary Care Physican: Victorio Grave, MD Primary Cardiologist: Mitzie Anda Electrophysiologist: Carolynne Citron 01/25/2024 Office Weight: 126 lbs     04/17/2024 Office Weight: 128 lbs                                                   Spoke with husband per DPR and heart failure questions reviewed.  Transmission results reviewed.  Pt asymptomatic for fluid accumulation.  She is waiting on leg surgery and will have up device upgraded to biv icd and will be scheduled soon.    CorVue thoracic impedance suggesting fluid levels since 4/26 with the exception of possible fluid accumulation from 5/5-5/14.   Prescribed:  Furosemide  40 mg take 1 tablet(s) (40 mg total) by mouth daily.   Labs: 12/27/2023 Creatinine 1.04, BUN 32, Potassium 4.4, Sodium 137, GFR 56  11/22/2023 Creatinine 1.17, BUN 33, Potassium 4.5, Sodium 135, GFR 48  A complete set of results can be found in Results Review.   Recommendations:  No changes and encouraged to call if experiencing any fluid symptoms.   Follow-up plan: ICM clinic phone appointment on 05/05/2024 to check the stability of fluid levels.   91 day device clinic remote transmission 05/02/2024.     EP/Cardiology Office Visits:  Recall 07/16/2024 with Dr Mitzie Anda.     Copy of ICM check sent to Dr. Carolynne Citron.   3 month ICM trend: 04/14/2024.     Almyra Jain, RN 04/17/2024 4:47 PM

## 2024-04-17 NOTE — Therapy (Signed)
 OUTPATIENT OCCUPATIONAL THERAPY LOW VISION TREATMENT AND DISCHARGE  Patient Name: Katherine Larson MRN: [id number] DOB:1946-01-10, 78 y.o., female Today's Date: 04/17/2024  OCCUPATIONAL THERAPY DISCHARGE SUMMARY  Visits from Start of Care: 4  Current functional level related to goals / functional outcomes: Patient has met 2/2 short-term goals and 0/1 long-term goals to date.   Remaining deficits: Pt remains limited by visual impairments, which is further complicated by orthopedic and cardiac needs.    Education / Equipment: Continue to practice visual modifications/use of devices.  Reconsult OT once comorbidities have been adequately addressed.   Patient agrees to discharge. Patient goals were partially met. Patient is being discharged due to upcoming medical procedures.Katherine Larson  PCP: Victorio Grave, MD  REFERRING PROVIDER: [facility] - Dr. Carloyn Chi  END OF SESSION:  OT End of Session - 04/17/24 1233     Visit Number 4    Number of Visits 7    Date for OT Re-Evaluation 05/16/24    Authorization Type [employer] Medicare - auth required    OT Start Time 1236    OT Stop Time 1319    OT Time Calculation (min) 43 min    Activity Tolerance Patient tolerated treatment well    Behavior During Therapy WFL for tasks assessed/performed            Past Medical History:  Diagnosis Date   Anemia    anemia of chronic disease +/- IDA followed by Dr. Liam Redhead. received iron  infusions in the past   Asthma    Borderline glaucoma    CAD (coronary artery disease)    a. s/p CABG 1996. b. low risk nuc 2011. c. LHC 04/2016 due to drop in EF -> occluded native LAD,  widely patent sequential LIMA-D1-LAD.   Chronic kidney disease    "mild stage of kidney disease"   Chronic systolic CHF (congestive heart failure) (HCC)    Complication of anesthesia    DM2 (diabetes mellitus, type 2) (HCC)    not on any medicine for this at this time, 05/2016   Dyspnea    with exertion   GERD (gastroesophageal  reflux disease)    Gout    History of blood transfusion    History of hiatal hernia    in 20s   History of pneumonia    March, 2016   HLD (hyperlipidemia)    HTN (hypertension)    Hyperthyroidism 01/21/2016   Inappropriate sinus tachycardia (HCC)    LBBB (left bundle branch block)    a. 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 Dx 04/2016 - EF 25-30%, diffuse HK, elevated LVEDP, mild MR, mod LAE.   Normocytic anemia 10/17/2021   NSVT (nonsustained ventricular tachycardia) (HCC)    PAT (paroxysmal atrial tachycardia) (HCC)    Pneumonia    PONV (postoperative nausea and vomiting)    "after just about every surgery I've had"   Rheumatoid arthritis (HCC)    RA- hands   Seasonal allergies    Past Surgical History:  Procedure Laterality Date   ABDOMINAL HYSTERECTOMY     APPENDECTOMY     BACK SURGERY     BIOPSY  04/27/2020   Procedure: BIOPSY;  Surgeon: Suzette Espy, MD;  [facility];  Service: Endoscopy;;  ascending colon biopsy   CARDIAC  CATHETERIZATION N/A 05/01/2016   Procedure: Right/Left Heart Cath and Coronary/Graft Angiography;  Surgeon: Arleen Lacer, MD;  Location: [facility] INVASIVE CV LAB;  Service: Cardiovascular;  Laterality: N/A;   CHOLECYSTECTOMY     COLONOSCOPY  11/2011   Dr. Magod:ext/int hemorrhoids, diverticulosis sigmoid colon and distal desc colon, mid-desc colon hyperplastic polyps.    COLONOSCOPY WITH PROPOFOL  N/A 04/27/2020   Rourk: Diverticulosis, hemorrhoids, normal terminal ileum.  Random colon biopsies significant for chronic lymphocytic colitis. No repeat due to age.   CORONARY ARTERY BYPASS GRAFT  1996   2 vessels   ESOPHAGOGASTRODUODENOSCOPY  11/2011   Dr. Lavaughn [place]: small hiatal hernia, one non-bleeding superficial gastric ulcer, medium-sized diverticulum in area of papilla    ESOPHAGOGASTRODUODENOSCOPY N/A 12/29/2015   Dr. Riley Cheadle: Chronic inactive gastritis, normal esophagus status post dilation, duodenal diverticula   ESOPHAGOGASTRODUODENOSCOPY (EGD) WITH PROPOFOL  N/A 07/24/2019   Dr. Riley Cheadle: Normal esophagus status post empiric dilation, small hiatal hernia, large D2 diverticulum   ESOPHAGOGASTRODUODENOSCOPY (EGD) WITH PROPOFOL  N/A 06/03/2021   Surgeon: Suzette Espy, MD; normal esophagus, normal stomach, normal duodenum.  Abnormal hypopharyngeal mucosa bilaterally just distal to the base of the tongue (right side greater than left), overlying mucosa appeared irregular and somewhat verrucous in appearance.  Recommended ENT evaluation.   EYE SURGERY Bilateral    cataract removal   FOOT SURGERY     removal bone spur   FRACTURE SURGERY Right    arm   GIVENS CAPSULE STUDY  11/2012   Dr. Lavaughn [place]: minimal gastritis, normal small bowel capsule   HERNIA REPAIR     hiatal hernia   ICD IMPLANT N/A 02/04/2021   Procedure: ICD IMPLANT;  Surgeon: Tammie Fall, MD;  Location: [facility] INVASIVE CV LAB;  Service: Cardiovascular;  Laterality: N/A;   MALONEY DILATION N/A 07/24/2019   Procedure: Londa Rival DILATION;  Surgeon: Suzette Espy, MD;  [facility];  Service: Endoscopy;  Laterality: N/A;   MALONEY DILATION N/A 06/03/2021   Procedure: Londa Rival DILATION;  Surgeon: Suzette Espy, MD;  [facility];  Service: Endoscopy;  Laterality: N/A;   MAXIMUM ACCESS (MAS)POSTERIOR LUMBAR INTERBODY FUSION (PLIF) 1 LEVEL N/A 08/14/2013   Procedure:  MAXIMUM ACCESS SURGERY(MAS) POSTERIOR LUMBAR INTERBODY FUSION LUMBAR THREE-FOUR ;  Surgeon: Isadora Mar, MD;  Location: [facility] ORS;  Service: Neurosurgery;  Laterality: N/A;   MAXIMUM ACCESS SURGERY(MAS) POSTERIOR LUMBAR INTERBODY FUSION LUMBAR THREE-FOUR    PTCA     RIGHT/LEFT HEART CATH AND CORONARY ANGIOGRAPHY N/A 03/26/2020   Procedure: RIGHT/LEFT HEART CATH AND CORONARY ANGIOGRAPHY;  Surgeon: Arleen Lacer, MD;   Location: [facility] INVASIVE CV LAB;  Service: Cardiovascular;  Laterality: N/A;   SPINAL CORD STIMULATOR BATTERY EXCHANGE N/A 01/19/2021   Procedure: Spinal cord stimulator battery change;  Surgeon: Annis Kinder, MD;  Location: [facility] OR;  Service: Neurosurgery;  Laterality: N/A;   SPINAL CORD STIMULATOR INSERTION N/A 06/16/2016   Procedure: LUMBAR SPINAL CORD STIMULATOR INSERTION;  Surgeon: Gerri Kras, MD;  Location: [facility];  Service: Neurosurgery;  Laterality: N/A;  LUMBAR SPINAL CORD STIMULATOR INSERTION   TEE WITHOUT CARDIOVERSION N/A 02/01/2023   Procedure: TRANSESOPHAGEAL ECHOCARDIOGRAM (TEE);  Surgeon: Gerard Knight, MD;  Location: [facility];  Service: Cardiovascular;  Laterality: N/A;   tens  05/2016   TONSILLECTOMY     Patient Active Problem List   Diagnosis Date Noted   Bacteremia 02/01/2023   Pseudomonas sepsis (HCC) 01/30/2023   Acute respiratory failure with hypoxia (HCC) 01/28/2023  Multifocal pneumonia 01/28/2023   Melena 11/28/2022   Rectal bleeding 11/28/2022   Upper abdominal pain 11/28/2022   Early satiety 07/27/2022   Nausea without vomiting 07/27/2022   Hypotension 02/10/2022   Normocytic anemia 10/17/2021   Lymphocytic colitis 07/02/2020   Coronary artery disease, occlusive: CTO proxLAD 03/26/2020   Abnormal nuclear stress test 03/26/2020   Chronic diarrhea 03/02/2020   NICM- new drop in EF 25-30% 05/03/2016   Acute pulmonary edema (HCC)    LBBB (left bundle branch block) 04/29/2016   Chronic HFrEF (heart failure with reduced ejection fraction) (HCC) 04/29/2016   Abdominal pain, chronic, epigastric 03/07/2016   Septic shock (HCC)    COPD exacerbation (HCC) 01/21/2016   Hyperthyroidism 01/21/2016   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   Hypokalemia 01/19/2016   Hyponatremia 01/19/2016   Diet-controlled diabetes mellitus (HCC) 01/19/2016   CAP (community acquired pneumonia) 01/18/2016   Mucosal abnormality of stomach    Dysphagia    Constipation 12/08/2015    Gastroesophageal reflux disease 12/08/2015   Abnormal CT scan, esophagus 12/08/2015   Esophageal dysphagia 12/08/2015   Abdominal pain, epigastric 12/08/2015   Loss of weight 12/08/2015   Lower abdominal pain 12/08/2015   Back pain 08/26/2015   Headache 08/26/2015   Essential hypertension 08/26/2015   DM type 2 (diabetes mellitus, type 2) (HCC) 08/26/2015   Sinus tachycardia 06/07/2015   Diarrhea 06/07/2015   Preop cardiovascular exam 07/16/2013   Atrial flutter (HCC) 07/16/2013   Anemia 01/08/2013   Leukocytosis 12/17/2012   DYSPNEA 02/02/2010   DIABETES MELLITUS 07/28/2009   Hyperlipidemia 07/28/2009   CHOLECYSTECTOMY, HX OF 07/28/2009   Hx of CABG '96- cath 05/01/16 07/28/2009   HERNIORRHAPHY, HX OF 07/28/2009   ONSET DATE: 12/18/2023 (Date of referral) - reports significant decline in vision over the last couple years  REFERRING DIAG: H35.30 (ICD-10-CM) - Macular degeneration   THERAPY DIAG:  Visuospatial deficit  Legally blind  Rationale for Evaluation and Treatment: Rehabilitation  SUBJECTIVE:   SUBJECTIVE STATEMENT:  She received a talking clock, a large calendar, and some bump dots from services for the blind. The pt really enjoys the clock.   She is supposed to have her knee surgery on 6/9. She is also supposed to have a heart procedure at the end of the month.    Pt accompanied by: self and husband waited in the lobby   PERTINENT HISTORY: PMH: DM II, CKD, CAD, hyperthyroidism, borderline glaucoma, AMD, spinal cord stimulator, RA, neuropathy in feet, MI (1996), CHF, HLD, HTN  PAIN:  Are you having pain? Yes: NPRS scale: 10/10 Pain location: R knee and back Pain description: excruciating; throbs Aggravating factors: getting up Relieving factors: positioning  FALLS: Has patient fallen in last 6 months? 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; retired Garment/textile technologist  PATIENT GOALS: be as independent as possible/do as much as she can  OBJECTIVE:   GLASSES: N/A  CURRENT MODIFICATIONS/ADAPTIONS: uses flashlight to see help read items  DEVICES: Husband reports 20 and 30 x magnifiers but pt unable to use these to read anymore  PHONE: does not know how to use; husband reports their daughter is in the process of getting the pt an iPhone  HAND DOMINANCE: Right  ADLs IADLs: Overall ADLs: mostly mod I with accommodations  Meal Prep: dependent  Handwriting: Not legible and unable  MOBILITY STATUS: Needs Assist: min A for sit to stand and min A to navigate using RW  FUNCTIONAL OUTCOME MEASURES: AM-PAC 6 Clicks for ADLs 1. Putting on and taking off regular lower body clothing?  A little  2. Bathing (including washing, rinsing, drying)? A little 3. Toileting, which includes using toilet, bedpan, or urinal?  A little 4. Putting on and taking off regular upper body clothing?  A little 5. Personal grooming such as brushing teeth?  A little 6. Eating meals?  A little  RAW Score: 18/24 Functional Impairment: 47%  03/20/2024: PSFS1.5 total score   Total score = sum of the activity scores/number of activities Minimum detectable change (90%CI) for average score = 2 points Minimum detectable change (90%CI) for single activity score = 3 points  COGNITION: Overall cognitive status: Within functional limits for tasks assessed  VISUAL FINDINGS: Baseline vision: Legally blind Visual history: glaucoma and macular degeneration  Impaired Reading acuity: 20/320 - distance acuity per referral: CF @ 2' OU  OBSERVATIONS: Pt appears well-kept. Ambulates with use of RW and min to CGA, altered gait, but no LOB. No glasses donned.   TODAY'S TREATMENT:                                                                                                                                OT reviewed play of Golf Solitaire for BUE ROM, coordination, visual processing, scanning, and sequencing. Pt required moderate cues for proper play. OT educated pt on use of large face playing cards and ability to take a visual break as needed if vision becomes blurry after playing for an extended period of time.   OT educated pt on Iphone magnifier, accessibility features for phone display, and arranging home screen icons to help with accurate app location.   Therapist reviewed goals with patient and updated patient progression.  OT educated pt that due to upcoming procedures, will discharge this visit with the intent to resume low vision therapy once medically appropriate.   PATIENT EDUCATION: Education details: Manufacturing systems engineer; Iphone use; OT d/c  Person educated: Patient, husband at end of session Education method: Explanation  Education comprehension: verbalized understanding   HOME EXERCISE PROGRAM: N/A  GOALS:  SHORT TERM GOALS: Target date: 03/21/2024    Patient will independently recall at least 2 compensatory strategies for visual impairment without cueing. Baseline: Goal status: MET  2.  Patient will return demonstration of visual adaptation use and verbalize understanding of how to obtain item(s) if desired.  Baseline:  Goal status: MET  LONG TERM GOALS: Target date: 05/16/24   Patient will report at least two-point increase in average PSFS score or at least three-point increase in a single activity score indicating functionally significant improvement given minimum detectable change.  Baseline: 1.5 total score (See above for individual activity scores)  Goal status: IN PROGRESS  ASSESSMENT:  CLINICAL IMPRESSION: Pt has  acquired various devices and understands strategies as needed to improve independence and safety with ADL and IADL completion despite visual impairments. At this time, her orthopedic  and cardiac needs surpass her need for low vision therapy at this time. Recommend return to OT once medically appropriate.   PLAN:  OT D/C Completed  CONSULTED AND AGREED WITH PLAN OF CARE: Patient and husband   Altamease Asters, [place] 04/17/2024, 6:24 PM

## 2024-04-18 ENCOUNTER — Inpatient Hospital Stay

## 2024-04-18 ENCOUNTER — Telehealth: Payer: Self-pay | Admitting: Internal Medicine

## 2024-04-18 VITALS — BP 117/61 | HR 70 | Temp 97.7°F | Resp 18

## 2024-04-18 DIAGNOSIS — D649 Anemia, unspecified: Secondary | ICD-10-CM

## 2024-04-18 DIAGNOSIS — I129 Hypertensive chronic kidney disease with stage 1 through stage 4 chronic kidney disease, or unspecified chronic kidney disease: Secondary | ICD-10-CM | POA: Diagnosis not present

## 2024-04-18 DIAGNOSIS — N1831 Chronic kidney disease, stage 3a: Secondary | ICD-10-CM | POA: Diagnosis not present

## 2024-04-18 DIAGNOSIS — D631 Anemia in chronic kidney disease: Secondary | ICD-10-CM | POA: Diagnosis not present

## 2024-04-18 LAB — CBC WITH DIFFERENTIAL/PLATELET
Abs Immature Granulocytes: 0.04 10*3/uL (ref 0.00–0.07)
Basophils Absolute: 0.1 10*3/uL (ref 0.0–0.1)
Basophils Relative: 1 %
Eosinophils Absolute: 0.3 10*3/uL (ref 0.0–0.5)
Eosinophils Relative: 3 %
HCT: 33.4 % — ABNORMAL LOW (ref 36.0–46.0)
Hemoglobin: 10.4 g/dL — ABNORMAL LOW (ref 12.0–15.0)
Immature Granulocytes: 0 %
Lymphocytes Relative: 28 %
Lymphs Abs: 2.7 10*3/uL (ref 0.7–4.0)
MCH: 33.5 pg (ref 26.0–34.0)
MCHC: 31.1 g/dL (ref 30.0–36.0)
MCV: 107.7 fL — ABNORMAL HIGH (ref 80.0–100.0)
Monocytes Absolute: 0.9 10*3/uL (ref 0.1–1.0)
Monocytes Relative: 9 %
Neutro Abs: 5.7 10*3/uL (ref 1.7–7.7)
Neutrophils Relative %: 59 %
Platelets: 205 10*3/uL (ref 150–400)
RBC: 3.1 MIL/uL — ABNORMAL LOW (ref 3.87–5.11)
RDW: 15.3 % (ref 11.5–15.5)
WBC: 9.6 10*3/uL (ref 4.0–10.5)
nRBC: 0 % (ref 0.0–0.2)

## 2024-04-18 MED ORDER — EPOETIN ALFA-EPBX 10000 UNIT/ML IJ SOLN
10000.0000 [IU] | Freq: Once | INTRAMUSCULAR | Status: AC
  Administered 2024-04-18: 10000 [IU] via SUBCUTANEOUS
  Filled 2024-04-18: qty 1

## 2024-04-18 NOTE — Progress Notes (Signed)
 Retacrit injection given per orders. Patient tolerated it well without problems. Vitals stable and discharged home from clinic via wheelchair. Follow up as scheduled.

## 2024-04-18 NOTE — Telephone Encounter (Signed)
Pt aware.

## 2024-04-18 NOTE — Telephone Encounter (Signed)
 Patient would like to know if she still needs to have lab work. She says it was discussed during appointment, but she went to [facility] and they didn't have any active orders. Please advise.

## 2024-04-18 NOTE — Telephone Encounter (Signed)
 Spoke with pt. No further labs are needed at this time. Went over instructions with pt and instructed pt that a letter with instructions has been sent to MyChart.

## 2024-04-18 NOTE — Patient Instructions (Signed)
 [facility] [place] - A DEPT OF [facility]  Discharge Instructions: Thank you for choosing [facility] to provide your oncology and hematology care.  If you have a lab appointment with the Cancer Center - please note that after April 8th, 2024, all labs will be drawn in the cancer center.  You do not have to check in or register with the main entrance as you have in the past but will complete your check-in in the cancer center.  Wear comfortable clothing and clothing appropriate for easy access to any Portacath or PICC line.   We strive to give you quality time with your provider. You may need to reschedule your appointment if you arrive late (15 or more minutes).  Arriving late affects you and other patients whose appointments are after yours.  Also, if you miss three or more appointments without notifying the office, you may be dismissed from the clinic at the provider's discretion.      For prescription refill requests, have your pharmacy contact our office and allow 72 hours for refills to be completed.    Today you received the following injection Retacrit   To help prevent nausea and vomiting after your treatment, we encourage you to take your nausea medication as directed.  BELOW ARE SYMPTOMS THAT SHOULD BE REPORTED IMMEDIATELY: *FEVER GREATER THAN 100.4 F (38 C) OR HIGHER *CHILLS OR SWEATING *NAUSEA AND VOMITING THAT IS NOT CONTROLLED WITH YOUR NAUSEA MEDICATION *UNUSUAL SHORTNESS OF BREATH *UNUSUAL BRUISING OR BLEEDING *URINARY PROBLEMS (pain or burning when urinating, or frequent urination) *BOWEL PROBLEMS (unusual diarrhea, constipation, pain near the anus) TENDERNESS IN MOUTH AND THROAT WITH OR WITHOUT PRESENCE OF ULCERS (sore throat, sores in mouth, or a toothache) UNUSUAL RASH, SWELLING OR PAIN  UNUSUAL VAGINAL DISCHARGE OR ITCHING   Items with * indicate a potential emergency and should be followed up as soon as possible or go to the  Emergency Department if any problems should occur.  Please show the CHEMOTHERAPY ALERT CARD or IMMUNOTHERAPY ALERT CARD at check-in to the Emergency Department and triage nurse.  Should you have questions after your visit or need to cancel or reschedule your appointment, please contact [facility] [place] - A DEPT OF [facility] [phone]  and follow the prompts.  Office hours are 8:00 a.m. to 4:30 p.m. Monday - Friday. Please note that voicemails left after 4:00 p.m. may not be returned until the following business day.  We are closed weekends and major holidays. You have access to a nurse at all times for urgent questions. Please call the main number to the clinic [phone] and follow the prompts.  For any non-urgent questions, you may also contact your provider using MyChart. We now offer e-Visits for anyone 61 and older to request care online for non-urgent symptoms. For details visit [email or web address].   Also download the MyChart app! Go to the app store, search "MyChart", open the app, select [place], and log in with your MyChart username and password.

## 2024-04-24 ENCOUNTER — Other Ambulatory Visit: Payer: Self-pay

## 2024-04-24 DIAGNOSIS — I5022 Chronic systolic (congestive) heart failure: Secondary | ICD-10-CM | POA: Diagnosis not present

## 2024-04-24 DIAGNOSIS — I700 Atherosclerosis of aorta: Secondary | ICD-10-CM | POA: Diagnosis not present

## 2024-04-24 DIAGNOSIS — I129 Hypertensive chronic kidney disease with stage 1 through stage 4 chronic kidney disease, or unspecified chronic kidney disease: Secondary | ICD-10-CM | POA: Diagnosis not present

## 2024-04-24 DIAGNOSIS — J449 Chronic obstructive pulmonary disease, unspecified: Secondary | ICD-10-CM | POA: Diagnosis not present

## 2024-04-24 DIAGNOSIS — N183 Chronic kidney disease, stage 3 unspecified: Secondary | ICD-10-CM | POA: Diagnosis not present

## 2024-04-24 DIAGNOSIS — M1711 Unilateral primary osteoarthritis, right knee: Secondary | ICD-10-CM | POA: Diagnosis not present

## 2024-04-24 DIAGNOSIS — E785 Hyperlipidemia, unspecified: Secondary | ICD-10-CM | POA: Diagnosis not present

## 2024-04-24 DIAGNOSIS — Z01818 Encounter for other preprocedural examination: Secondary | ICD-10-CM | POA: Diagnosis not present

## 2024-04-24 DIAGNOSIS — I2510 Atherosclerotic heart disease of native coronary artery without angina pectoris: Secondary | ICD-10-CM | POA: Diagnosis not present

## 2024-04-24 DIAGNOSIS — E1121 Type 2 diabetes mellitus with diabetic nephropathy: Secondary | ICD-10-CM | POA: Diagnosis not present

## 2024-04-24 MED ORDER — CARVEDILOL 6.25 MG PO TABS: 6.2500 mg | ORAL_TABLET | Freq: Two times a day (BID) | ORAL | 3 refills | Status: AC

## 2024-04-24 NOTE — Telephone Encounter (Signed)
 This is a CHF pt

## 2024-04-25 ENCOUNTER — Ambulatory Visit (HOSPITAL_COMMUNITY)
Admission: RE | Admit: 2024-04-25 | Discharge: 2024-04-25 | Disposition: A | Discharge status: Home / Self Care | Source: Ambulatory Visit | Attending: Cardiology | Admitting: Cardiology

## 2024-04-25 DIAGNOSIS — I5022 Chronic systolic (congestive) heart failure: Secondary | ICD-10-CM | POA: Diagnosis not present

## 2024-04-25 LAB — BASIC METABOLIC PANEL WITH GFR
Anion gap: 12 (ref 5–15)
BUN: 27 mg/dL — ABNORMAL HIGH (ref 8–23)
CO2: 26 mmol/L (ref 22–32)
Calcium: 9.6 mg/dL (ref 8.9–10.3)
Chloride: 102 mmol/L (ref 98–111)
Creatinine, Ser: 1.18 mg/dL — ABNORMAL HIGH (ref 0.44–1.00)
GFR, Estimated: 48 mL/min — ABNORMAL LOW (ref >60)
Glucose, Bld: 110 mg/dL — ABNORMAL HIGH (ref 70–99)
Potassium: 4.7 mmol/L (ref 3.5–5.1)
Sodium: 140 mmol/L (ref 135–145)

## 2024-04-29 ENCOUNTER — Ambulatory Visit (HOSPITAL_COMMUNITY): Payer: Self-pay | Admitting: Cardiology

## 2024-04-30 ENCOUNTER — Other Ambulatory Visit: Payer: Self-pay | Admitting: Orthopedic Surgery

## 2024-04-30 DIAGNOSIS — M1711 Unilateral primary osteoarthritis, right knee: Secondary | ICD-10-CM | POA: Diagnosis not present

## 2024-04-30 NOTE — Telephone Encounter (Signed)
 CMN signed waiting for fax conformation

## 2024-04-30 NOTE — Telephone Encounter (Signed)
 CMN signed, fax conformation attached and sent to scan.

## 2024-05-01 ENCOUNTER — Encounter: Admitting: Occupational Therapy

## 2024-05-02 ENCOUNTER — Ambulatory Visit: Payer: Medicare Other

## 2024-05-02 DIAGNOSIS — I447 Left bundle-branch block, unspecified: Secondary | ICD-10-CM

## 2024-05-02 LAB — CUP PACEART REMOTE DEVICE CHECK
Battery Remaining Longevity: 88 mo
Battery Remaining Percentage: 72 %
Battery Voltage: 2.99 V
Brady Statistic RV Percent Paced: 1 %
Date Time Interrogation Session: 20250613022022
HighPow Impedance: 51 Ohm
Implantable Lead Connection Status: 753985
Implantable Lead Implant Date: 20220318
Implantable Lead Location: 753860
Implantable Pulse Generator Implant Date: 20220318
Lead Channel Impedance Value: 480 Ohm
Lead Channel Pacing Threshold Amplitude: 0.5 V
Lead Channel Pacing Threshold Pulse Width: 0.5 ms
Lead Channel Sensing Intrinsic Amplitude: 12 mV
Lead Channel Setting Pacing Amplitude: 2.5 V
Lead Channel Setting Pacing Pulse Width: 0.5 ms
Lead Channel Setting Sensing Sensitivity: 0.5 mV
Pulse Gen Serial Number: 810021948
Zone Setting Status: 755011

## 2024-05-04 ENCOUNTER — Ambulatory Visit: Payer: Self-pay | Admitting: Internal Medicine

## 2024-05-05 ENCOUNTER — Telehealth: Payer: Self-pay | Admitting: *Deleted

## 2024-05-05 ENCOUNTER — Ambulatory Visit: Attending: Internal Medicine

## 2024-05-05 DIAGNOSIS — Z95810 Presence of automatic (implantable) cardiac defibrillator: Secondary | ICD-10-CM

## 2024-05-05 DIAGNOSIS — I5022 Chronic systolic (congestive) heart failure: Secondary | ICD-10-CM

## 2024-05-05 NOTE — Telephone Encounter (Signed)
   Pre-operative Risk Assessment    Patient Name: Katherine Larson  DOB: 1946/11/01 MRN: [id number]   Date of last office visit: 04/17/24 HF CLINIC [name], [facility] Date of next office visit: 08/14/24 [name], NP   Request for Surgical Clearance    Procedure:  ARTHROPLASTY, KNEE, TOTAL-RIGHT KNEE  Date of Surgery:  Clearance 06/02/24                                Surgeon:  DR. Neil Balls Surgeon's Group or Practice Name:  [name] Phone number:  [phone] [name] Fax number:  [phone]   Type of Clearance Requested:   - Medical  - Pharmacy:  Hold Aspirin      Type of Anesthesia:  Spinal   Additional requests/questions:    [name]   05/05/2024, 4:18 PM

## 2024-05-06 NOTE — Telephone Encounter (Signed)
 [place],  You saw this patient on 04/17/2024. Per office protocol, will you please comment on medical clearance for right total knee arthroplasty on 06/02/2024?  Please route your response to P CV DIV Preop. I will communicate with requesting office once you have given recommendations.   Thank you!  Morey Ar, NP

## 2024-05-07 NOTE — Progress Notes (Signed)
 EPIC Encounter for ICM Monitoring  Patient Name: Katherine Larson is a 78 y.o. female Date: 05/07/2024 Primary Care Physican: Victorio Grave, MD Primary Cardiologist: Mitzie Anda Electrophysiologist: Carolynne Citron 01/25/2024 Office Weight: 126 lbs     04/17/2024 Office Weight: 128 lbs                                                   Spoke with patient and heart failure questions reviewed.  Transmission results reviewed.  Pt asymptomatic for fluid accumulation.   Scheduled for device upgrade procedure on 6/25 and Total Knee Replacement 7/14.  Advised to monitor for fluid accumulation following knee surgery and call Dr Charline Contras office for recommendations if she experiences any fluid symptoms.    CorVue thoracic impedance suggesting normal fluid levels since 5/15 with the exception of possible fluid accumulation from 5/31-6/3.   Prescribed:  Furosemide  40 mg take 0.5 tablet(s) (20 mg total) by mouth daily.   Labs: 04/25/2024 Creatinine 1.18, BUN 27, Potassium 4.7, Sodium 140, GFR 48  04/17/2024 Creatinine 1.25, BUN 32, Potassium 5.5, Sodium 136, GFR 45  03/28/2024 Creatinine 1.11, BUN 28, Potassium 5.3, Sodium 141, GFR 52  03/17/2024 Creatinine 1.39, BUN 34, Potassium 4.6, Sodium 140 12/27/2023 Creatinine 1.04, BUN 32, Potassium 4.4, Sodium 137, GFR 56  11/22/2023 Creatinine 1.17, BUN 33, Potassium 4.5, Sodium 135, GFR 48  A complete set of results can be found in Results Review.   Recommendations:  No changes and encouraged to call if experiencing any fluid symptoms.   Follow-up plan: ICM clinic phone appointment on 06/23/2024 (to allow time for thoracic impedance to develop after upgrade).   91 day device clinic remote transmission 08/01/2024.     EP/Cardiology Office Visits:  Recall 07/16/2024 with Dr Mitzie Anda.  08/14/2024 with [name], NP.   Copy of ICM check sent to Dr. Carolynne Citron.   3 month ICM trend: 05/05/2024.    12-14 Month ICM trend:     Almyra Jain, RN 05/07/2024 8:46 AM

## 2024-05-08 ENCOUNTER — Encounter: Payer: Self-pay | Admitting: Emergency Medicine

## 2024-05-08 DIAGNOSIS — M1711 Unilateral primary osteoarthritis, right knee: Secondary | ICD-10-CM | POA: Diagnosis not present

## 2024-05-08 DIAGNOSIS — R262 Difficulty in walking, not elsewhere classified: Secondary | ICD-10-CM | POA: Diagnosis not present

## 2024-05-08 NOTE — Telephone Encounter (Signed)
 Copy of clearance with provider recommendations faxed to requesting office via Epic fax function.    Horace Lye, PA-C to Morey Ar, NP  [place] Brogden B, RN (Selected Message)     05/06/24  2:37 PM Katherine Larson can you route my note to requesting surgical team please? As noted previously by Dr. Mitzie Anda, would also need clearance from pulmonology.

## 2024-05-12 DIAGNOSIS — N1831 Chronic kidney disease, stage 3a: Secondary | ICD-10-CM | POA: Diagnosis not present

## 2024-05-12 DIAGNOSIS — D509 Iron deficiency anemia, unspecified: Secondary | ICD-10-CM | POA: Diagnosis not present

## 2024-05-12 DIAGNOSIS — R300 Dysuria: Secondary | ICD-10-CM | POA: Diagnosis not present

## 2024-05-12 DIAGNOSIS — N390 Urinary tract infection, site not specified: Secondary | ICD-10-CM | POA: Diagnosis not present

## 2024-05-12 DIAGNOSIS — I255 Ischemic cardiomyopathy: Secondary | ICD-10-CM | POA: Diagnosis not present

## 2024-05-12 DIAGNOSIS — M069 Rheumatoid arthritis, unspecified: Secondary | ICD-10-CM | POA: Diagnosis not present

## 2024-05-13 DIAGNOSIS — R300 Dysuria: Secondary | ICD-10-CM | POA: Diagnosis not present

## 2024-05-13 DIAGNOSIS — N1831 Chronic kidney disease, stage 3a: Secondary | ICD-10-CM | POA: Diagnosis not present

## 2024-05-14 ENCOUNTER — Ambulatory Visit (HOSPITAL_COMMUNITY)
Admission: RE | Admit: 2024-05-14 | Discharge: 2024-05-14 | Disposition: A | Discharge status: Home / Self Care | Attending: Internal Medicine | Admitting: Internal Medicine

## 2024-05-14 ENCOUNTER — Other Ambulatory Visit: Payer: Self-pay

## 2024-05-14 ENCOUNTER — Ambulatory Visit (HOSPITAL_COMMUNITY)

## 2024-05-14 ENCOUNTER — Encounter (HOSPITAL_COMMUNITY)
Admission: RE | Disposition: A | Discharge status: Home / Self Care | Payer: Self-pay | Source: Home / Self Care | Attending: Internal Medicine

## 2024-05-14 ENCOUNTER — Encounter (HOSPITAL_COMMUNITY): Payer: Self-pay | Admitting: Internal Medicine

## 2024-05-14 DIAGNOSIS — I255 Ischemic cardiomyopathy: Secondary | ICD-10-CM | POA: Insufficient documentation

## 2024-05-14 DIAGNOSIS — I130 Hypertensive heart and chronic kidney disease with heart failure and stage 1 through stage 4 chronic kidney disease, or unspecified chronic kidney disease: Secondary | ICD-10-CM | POA: Insufficient documentation

## 2024-05-14 DIAGNOSIS — N189 Chronic kidney disease, unspecified: Secondary | ICD-10-CM | POA: Diagnosis not present

## 2024-05-14 DIAGNOSIS — Z87891 Personal history of nicotine dependence: Secondary | ICD-10-CM | POA: Insufficient documentation

## 2024-05-14 DIAGNOSIS — Z951 Presence of aortocoronary bypass graft: Secondary | ICD-10-CM | POA: Diagnosis not present

## 2024-05-14 DIAGNOSIS — I447 Left bundle-branch block, unspecified: Secondary | ICD-10-CM

## 2024-05-14 DIAGNOSIS — Z95810 Presence of automatic (implantable) cardiac defibrillator: Secondary | ICD-10-CM | POA: Diagnosis not present

## 2024-05-14 DIAGNOSIS — Z4502 Encounter for adjustment and management of automatic implantable cardiac defibrillator: Secondary | ICD-10-CM | POA: Insufficient documentation

## 2024-05-14 DIAGNOSIS — Z79899 Other long term (current) drug therapy: Secondary | ICD-10-CM | POA: Diagnosis not present

## 2024-05-14 DIAGNOSIS — I5022 Chronic systolic (congestive) heart failure: Secondary | ICD-10-CM

## 2024-05-14 DIAGNOSIS — E1122 Type 2 diabetes mellitus with diabetic chronic kidney disease: Secondary | ICD-10-CM | POA: Insufficient documentation

## 2024-05-14 DIAGNOSIS — Z7984 Long term (current) use of oral hypoglycemic drugs: Secondary | ICD-10-CM | POA: Insufficient documentation

## 2024-05-14 HISTORY — PX: BIV UPGRADE: EP1202

## 2024-05-14 SURGERY — BIV UPGRADE
Anesthesia: LOCAL

## 2024-05-14 MED ORDER — VANCOMYCIN HCL IN DEXTROSE 1-5 GM/200ML-% IV SOLN
INTRAVENOUS | Status: AC
  Administered 2024-05-14: 1000 mg
  Filled 2024-05-14: qty 200

## 2024-05-14 MED ORDER — HEPARIN (PORCINE) IN NACL 1000-0.9 UT/500ML-% IV SOLN
INTRAVENOUS | Status: DC | PRN
Start: 2024-05-14 — End: 2024-05-14
  Administered 2024-05-14: 500 mL

## 2024-05-14 MED ORDER — SODIUM CHLORIDE 0.9 % IV SOLN: INTRAVENOUS | Status: DC

## 2024-05-14 MED ORDER — IODIXANOL 320 MG/ML IV SOLN
INTRAVENOUS | Status: DC | PRN
  Administered 2024-05-14: 17 mL via INTRAVENOUS

## 2024-05-14 MED ORDER — POVIDONE-IODINE 10 % EX SWAB
2.0000 | Freq: Once | CUTANEOUS | Status: AC
  Administered 2024-05-14: 2 via TOPICAL

## 2024-05-14 MED ORDER — MIDAZOLAM HCL 5 MG/5ML IJ SOLN
INTRAMUSCULAR | Status: DC | PRN
  Administered 2024-05-14 (×3): 1 mg via INTRAVENOUS
  Administered 2024-05-14: 2 mg via INTRAVENOUS

## 2024-05-14 MED ORDER — CEFAZOLIN SODIUM-DEXTROSE 1-4 GM/50ML-% IV SOLN: 1.0000 g | Freq: Once | INTRAVENOUS | Status: DC

## 2024-05-14 MED ORDER — MIDAZOLAM HCL 2 MG/2ML IJ SOLN
INTRAMUSCULAR | Status: AC
  Filled 2024-05-14: qty 2

## 2024-05-14 MED ORDER — CEFAZOLIN SODIUM-DEXTROSE 2-4 GM/100ML-% IV SOLN
INTRAVENOUS | Status: DC
Start: 2024-05-14 — End: 2024-05-14
  Filled 2024-05-14: qty 100

## 2024-05-14 MED ORDER — LIDOCAINE HCL (PF) 1 % IJ SOLN
INTRAMUSCULAR | Status: DC | PRN
  Administered 2024-05-14: 60 mL

## 2024-05-14 MED ORDER — CEFAZOLIN SODIUM-DEXTROSE 2-4 GM/100ML-% IV SOLN
2.0000 g | INTRAVENOUS | Status: DC
Start: 2024-05-14 — End: 2024-05-14

## 2024-05-14 MED ORDER — CHLORHEXIDINE GLUCONATE 4 % EX SOLN
4.0000 | Freq: Once | CUTANEOUS | Status: DC
  Filled 2024-05-14: qty 60

## 2024-05-14 MED ORDER — LIDOCAINE HCL (PF) 1 % IJ SOLN
INTRAMUSCULAR | Status: AC
  Filled 2024-05-14: qty 60

## 2024-05-14 MED ORDER — SODIUM CHLORIDE 0.9 % IV SOLN
80.0000 mg | INTRAVENOUS | Status: DC
  Administered 2024-05-14: 80 mg

## 2024-05-14 MED ORDER — FENTANYL CITRATE (PF) 100 MCG/2ML IJ SOLN
INTRAMUSCULAR | Status: AC
Start: 2024-05-14 — End: 2024-05-14
  Filled 2024-05-14: qty 2

## 2024-05-14 MED ORDER — ONDANSETRON HCL 4 MG/2ML IJ SOLN: 4.0000 mg | Freq: Four times a day (QID) | INTRAMUSCULAR | Status: DC | PRN

## 2024-05-14 MED ORDER — ACETAMINOPHEN 325 MG PO TABS
325.0000 mg | ORAL_TABLET | ORAL | Status: DC | PRN
  Administered 2024-05-14: 650 mg via ORAL
  Filled 2024-05-14: qty 2

## 2024-05-14 MED ORDER — FENTANYL CITRATE (PF) 100 MCG/2ML IJ SOLN
INTRAMUSCULAR | Status: DC | PRN
  Administered 2024-05-14 (×3): 12.5 ug via INTRAVENOUS
  Administered 2024-05-14: 25 ug via INTRAVENOUS

## 2024-05-14 MED ORDER — SODIUM CHLORIDE 0.9 % IV SOLN
INTRAVENOUS | Status: AC
  Filled 2024-05-14: qty 2

## 2024-05-14 MED ORDER — IOPAMIDOL (ISOVUE-370) INJECTION 76%: INTRAVENOUS | Status: DC | PRN

## 2024-05-14 SURGICAL SUPPLY — 17 items
CABLE SURGICAL S-101-97-12 (CABLE) ×1 IMPLANT
CATH CPS DIRECT 135 DS2C020 (CATHETERS) IMPLANT
CATH JOSEPH QUAD ALLRED 6F REP (CATHETERS) IMPLANT
GUIDEWIRE ANGLED .035X150CM (WIRE) IMPLANT
GUIDEWIRE VASC J-TIP .035X150 (WIRE) IMPLANT
ICD GALLANT HFCRTD CDHFA500Q (ICD Generator) IMPLANT
KIT ESSENTIALS PG (KITS) IMPLANT
LEAD QUARTET 1458Q-86CM (Lead) IMPLANT
LEAD ULTIPACE 52 LPA1231/52 (Lead) IMPLANT
PAD DEFIB RADIO PHYSIO CONN (PAD) ×1 IMPLANT
POUCH AIGIS-R ANTIBACT ICD (Mesh General) ×1 IMPLANT
POUCH AIGIS-R ANTIBACT ICD LRG (Mesh General) IMPLANT
SHEATH 7FR PRELUDE SNAP 13 (SHEATH) IMPLANT
SLITTER AGILIS HISPRO (INSTRUMENTS) IMPLANT
SYR CONTROL 10ML ANGIOGRAPHIC (SYRINGE) IMPLANT
TRAY PACEMAKER INSERTION (PACKS) ×1 IMPLANT
WIRE ACUITY WHISPER EDS 4648 (WIRE) IMPLANT

## 2024-05-14 NOTE — H&P (Signed)
 HPI [name] returns today for followup. She is a pleasant 78 yo woman with an ICM, EF 35%, IVCD, who underwent ICD insertion 3 years ago. She has developed worsening CHF symptoms despite GDMT and is now class 3. She has developed LBBB with a QRS duration of 150 over the past few months with dysynchronous activation on her echo. She is referred to consider upgrade from a single chamber ICD to a biv ICD. She has not had any ICD therapies.  Allergies       Allergies  Allergen Reactions   Biaxin [Clarithromycin] Rash and Other (See Comments)      Blisters in mouth     Penicillins Rash and Other (See Comments)      Blisters in mouth Has patient had a PCN reaction causing immediate rash, facial/tongue/throat swelling, SOB or lightheadedness with hypotension: Yesyes Has patient had a PCN reaction causing severe rash involving mucus membranes or skin necrosis: Nono Has patient had a PCN reaction that required hospitalization Nono Has patient had a PCN reaction occurring within the last 10 years: Nono If all of the above answers are NO, then may proceed   Hydroxychloroquine  Other (See Comments)   Sulfamethoxazole        Other Reaction(s): Other (See Comments)   Sulfasalazine Other (See Comments)   Losartan  Potassium        Other reaction(s): elevated creatinine                Current Outpatient Medications  Medication Sig Dispense Refill   albuterol  (VENTOLIN  HFA) 108 (90 Base) MCG/ACT inhaler Inhale 1-2 puffs into the lungs every 6 (six) hours as needed for wheezing or shortness of breath. 18 g 0   allopurinol  (ZYLOPRIM ) 300 MG tablet Take 300 mg by mouth daily.       aspirin  EC 81 MG EC tablet Take 1 tablet (81 mg total) by mouth daily.       atorvastatin  (LIPITOR ) 80 MG tablet Take 80 mg by mouth daily.       carvedilol  (COREG ) 6.25 MG tablet Take 6.25 mg by mouth 2 (two) times daily with a meal.       cholecalciferol (VITAMIN D3) 25 MCG (1000 UT) tablet Take 1,000  Units by mouth daily.       dapagliflozin  propanediol (FARXIGA ) 10 MG TABS tablet TAKE 1 TABLET(10 MG) BY MOUTH DAILY BEFORE BREAKFAST 30 tablet 10   etanercept (ENBREL SURECLICK) 50 MG/ML injection Inject 50 mg into the skin once a week. For 30 days       Evolocumab  (REPATHA  SURECLICK) 140 MG/ML SOAJ Inject 140 mg into the skin every 14 (fourteen) days. 2 mL 2   ferrous sulfate 325 (65 FE) MG tablet Take 325 mg by mouth daily with breakfast. Every other day       fexofenadine (ALLEGRA) 180 MG tablet Take 180 mg by mouth daily.       fluticasone  (FLONASE ) 50 MCG/ACT nasal spray Place 1 spray into both nostrils 2 (two) times daily.       furosemide  (LASIX ) 40 MG tablet Take 0.5 tablets (20 mg total) by mouth daily. 45 tablet 3   gabapentin  (NEURONTIN ) 300 MG capsule Take 300 mg by mouth 3 (three) times daily.       HYDROcodone -acetaminophen  (NORCO) 10-325 MG tablet Take 1 tablet by mouth every 4 (four) hours as needed (3-4 times PRN).       lansoprazole  (PREVACID ) 30 MG capsule  TAKE 1 CAPSULE BY MOUTH 1 TO 2 TIMES DAILY BEFORE A MEAL 60 capsule 5   leflunomide  (ARAVA ) 10 MG tablet Take 10 mg by mouth daily.       MAGNESIUM -OXIDE 400 (240 Mg) MG tablet TAKE 1 TABLET(400 MG) BY MOUTH DAILY 30 tablet 2   meclizine (ANTIVERT) 12.5 MG tablet Take 12.5 mg by mouth 3 (three) times daily as needed for dizziness.       montelukast  (SINGULAIR ) 10 MG tablet Take 10 mg by mouth at bedtime.       Multiple Vitamins-Minerals (OCUVITE ADULT 50+ PO) Take 1 tablet by mouth daily.       nitroGLYCERIN  (NITROSTAT ) 0.4 MG SL tablet DISSOLVE 1 TABLET UNDER THE  TONGUE EVERY 5 MINUTES AS NEEDED FOR CHEST PAIN. MAX OF 3 TABLETS IN 15 MINUTES. CALL 911 IF PAIN  PERSISTS. 75 tablet 4   sacubitril -valsartan  (ENTRESTO ) 24-26 MG Take 1 tablet by mouth 2 (two) times daily. 60 tablet 6   spironolactone  (ALDACTONE ) 25 MG tablet Take 1 tablet (25 mg total) by mouth daily. 90 tablet 3   TRELEGY ELLIPTA 200-62.5-25 MCG/ACT AEPB  Inhale 1 puff into the lungs daily.       VASCEPA  1 g capsule TAKE 1 CAPSULE(1 GRAM) BY MOUTH TWICE DAILY 120 capsule 10      No current facility-administered medications for this visit.              Past Medical History:  Diagnosis Date   Anemia      anemia of chronic disease +/- IDA followed by Dr. Gatha. received iron  infusions in the past   Asthma     Borderline glaucoma     CAD (coronary artery disease)      a. s/p CABG 1996. b. low risk nuc 2011. c. LHC 04/2016 due to drop in EF -> occluded native LAD,  widely patent sequential LIMA-D1-LAD.   Chronic kidney disease      mild stage of kidney disease   Chronic systolic CHF (congestive heart failure) (HCC)     Complication of anesthesia     DM2 (diabetes mellitus, type 2) (HCC)      not on any medicine for this at this time, 05/2016   Dyspnea      with exertion   GERD (gastroesophageal reflux disease)     Gout     History of blood transfusion     History of hiatal hernia      in 20s   History of pneumonia      March, 2016   HLD (hyperlipidemia)     HTN (hypertension)     Hyperthyroidism 01/21/2016   Inappropriate sinus tachycardia (HCC)     LBBB (left bundle branch block)      a. Seen in 04/2016   Lymphocytic colitis 04/2020   Macular degeneration      left   Myocardial infarction [facility])      1996   Neuropathy     Neuropathy     NICM (nonischemic cardiomyopathy) (HCC)      a. Dx 04/2016 - EF 25-30%, diffuse HK, elevated LVEDP, mild MR, mod LAE.   Normocytic anemia 10/17/2021   NSVT (nonsustained ventricular tachycardia) (HCC)     PAT (paroxysmal atrial tachycardia) (HCC)     Pneumonia     PONV (postoperative nausea and vomiting)      after just about every surgery I've had   Rheumatoid arthritis (HCC)      RA- hands  Seasonal allergies            ROS:    All systems reviewed and negative except as noted in the HPI.          Past Surgical History:  Procedure Laterality Date   ABDOMINAL  HYSTERECTOMY       APPENDECTOMY       BACK SURGERY       BIOPSY   04/27/2020    Procedure: BIOPSY;  Surgeon: Shaaron Lamar HERO, MD;  Location: [facility];  Service: Endoscopy;;  ascending colon biopsy   CARDIAC CATHETERIZATION N/A 05/01/2016    Procedure: Right/Left Heart Cath and Coronary/Graft Angiography;  Surgeon: Alm LELON Clay, MD;  Location: [facility] INVASIVE CV LAB;  Service: Cardiovascular;  Laterality: N/A;   CHOLECYSTECTOMY       COLONOSCOPY   11/2011    Dr. Magod:ext/int hemorrhoids, diverticulosis sigmoid colon and distal desc colon, mid-desc colon hyperplastic polyps.    COLONOSCOPY WITH PROPOFOL  N/A 04/27/2020    Rourk: Diverticulosis, hemorrhoids, normal terminal ileum.  Random colon biopsies significant for chronic lymphocytic colitis. No repeat due to age.   CORONARY ARTERY BYPASS GRAFT   1996    2 vessels   ESOPHAGOGASTRODUODENOSCOPY   11/2011    Dr. Rosalie: small hiatal hernia, one non-bleeding superficial gastric ulcer, medium-sized diverticulum in area of papilla   ESOPHAGOGASTRODUODENOSCOPY N/A 12/29/2015    Dr. Shaaron: Chronic inactive gastritis, normal esophagus status post dilation, duodenal diverticula   ESOPHAGOGASTRODUODENOSCOPY (EGD) WITH PROPOFOL  N/A 07/24/2019    Dr. Shaaron: Normal esophagus status post empiric dilation, small hiatal hernia, large D2 diverticulum   ESOPHAGOGASTRODUODENOSCOPY (EGD) WITH PROPOFOL  N/A 06/03/2021    Surgeon: Shaaron Lamar HERO, MD; normal esophagus, normal stomach, normal duodenum.  Abnormal hypopharyngeal mucosa bilaterally just distal to the base of the tongue (right side greater than left), overlying mucosa appeared irregular and somewhat verrucous in appearance.  Recommended ENT evaluation.   EYE SURGERY Bilateral      cataract removal   FOOT SURGERY        removal bone spur   FRACTURE SURGERY Right      arm   GIVENS CAPSULE STUDY   11/2012    Dr. Rosalie: minimal gastritis, normal small bowel capsule   HERNIA REPAIR        hiatal  hernia   ICD IMPLANT N/A 02/04/2021    Procedure: ICD IMPLANT;  Surgeon: Waddell Danelle LELON, MD;  Location: [facility] INVASIVE CV LAB;  Service: Cardiovascular;  Laterality: N/A;   MALONEY DILATION N/A 07/24/2019    Procedure: AGAPITO DILATION;  Surgeon: Shaaron Lamar HERO, MD;  Location: [facility];  Service: Endoscopy;  Laterality: N/A;   MALONEY DILATION N/A 06/03/2021    Procedure: AGAPITO DILATION;  Surgeon: Shaaron Lamar HERO, MD;  Location: [facility];  Service: Endoscopy;  Laterality: N/A;   MAXIMUM ACCESS (MAS)POSTERIOR LUMBAR INTERBODY FUSION (PLIF) 1 LEVEL N/A 08/14/2013    Procedure:  MAXIMUM ACCESS SURGERY(MAS) POSTERIOR LUMBAR INTERBODY FUSION LUMBAR THREE-FOUR ;  Surgeon: Alm GORMAN Molt, MD;  Location: [facility];  Service: Neurosurgery;  Laterality: N/A;   MAXIMUM ACCESS SURGERY(MAS) POSTERIOR LUMBAR INTERBODY FUSION LUMBAR THREE-FOUR    PTCA       RIGHT/LEFT HEART CATH AND CORONARY ANGIOGRAPHY N/A 03/26/2020    Procedure: RIGHT/LEFT HEART CATH AND CORONARY ANGIOGRAPHY;  Surgeon: Clay Alm LELON, MD;  Location: [facility] INVASIVE CV LAB;  Service: Cardiovascular;  Laterality: N/A;   SPINAL CORD STIMULATOR BATTERY EXCHANGE N/A 01/19/2021  Procedure: Spinal cord stimulator battery change;  Surgeon: Darlis Deatrice RAMAN, MD;  Location: [facility] OR;  Service: Neurosurgery;  Laterality: N/A;   SPINAL CORD STIMULATOR INSERTION N/A 06/16/2016    Procedure: LUMBAR SPINAL CORD STIMULATOR INSERTION;  Surgeon: Deward Fabian, MD;  Location: [facility];  Service: Neurosurgery;  Laterality: N/A;  LUMBAR SPINAL CORD STIMULATOR INSERTION   TEE WITHOUT CARDIOVERSION N/A 02/01/2023    Procedure: TRANSESOPHAGEAL ECHOCARDIOGRAM (TEE);  Surgeon: Debera Jayson MATSU, MD;  Location: [facility];  Service: Cardiovascular;  Laterality: N/A;   tens   05/2016   TONSILLECTOMY                     Family History  Problem Relation Age of Onset   Heart attack Father     Heart attack Mother     Bladder Cancer Brother     Cervical  cancer Daughter          ?   Colon cancer Neg Hx              Social History         Socioeconomic History   Marital status: Married      Spouse name: Not on file   Number of children: 3   Years of education: Not on file   Highest education level: Not on file  Occupational History   Not on file  Tobacco Use   Smoking status: Former      Current packs/day: 0.00      Average packs/day: 0.8 packs/day for 15.0 years (11.3 ttl pk-yrs)      Types: Cigarettes      Start date: 11/21/1979      Quit date: 11/20/1994      Years since quitting: 29.4   Smokeless tobacco: Never   Tobacco comments:      Quit in 1996  Vaping Use   Vaping status: Never Used  Substance and Sexual Activity   Alcohol  use: Never      Alcohol /week: 0.0 standard drinks of alcohol    Drug use: Never   Sexual activity: Yes  Other Topics Concern   Not on file  Social History Narrative    2 living children, one deceased age 78    Right handed    One story home    Drinks coffee am    Social Drivers of Acupuncturist Strain: Not on file  Food Insecurity: No Food Insecurity (01/28/2023)    Hunger Vital Sign     Worried About Running Out of Food in the Last Year: Never true     Ran Out of Food in the Last Year: Never true  Transportation Needs: No Transportation Needs (01/28/2023)    PRAPARE - Therapist, art (Medical): No     Lack of Transportation (Non-Medical): No  Physical Activity: Not on file  Stress: Not on file  Social Connections: Not on file  Intimate Partner Violence: Not At Risk (01/28/2023)    Humiliation, Afraid, Rape, and Kick questionnaire     Fear of Current or Ex-Partner: No     Emotionally Abused: No     Physically Abused: No     Sexually Abused: No        BP 112/76 Comment: unable to obtain  Pulse 67   Ht 5' 2 (1.575 m)   SpO2 96%   BMI 23.23 kg/m    Physical Exam:   Well  appearing NAD HEENT: Unremarkable Neck:  No JVD, no  thyromegally Lymphatics:  No adenopathy Back:  No CVA tenderness Lungs:  Clear with no wheezes HEART:  Regular rate rhythm with a split S2, no murmurs, no rubs, no clicks Abd:  soft, positive bowel sounds, no organomegally, no rebound, no guarding Ext:  2 plus pulses, no edema, no cyanosis, no clubbing Skin:  No rashes no nodules Neuro:  CN II through XII intact, motor grossly intact   EKG - reviewed. NSR with LBBB   DEVICE  Normal device function.  See PaceArt for details.    Assess/Plan: Class 3 CHF with LBBB, QRS 150 - I have discussed the treatment options with the patient and recommend proceeding with upgrade to a biv ICD. I have reviewed the indications/risks/benefits/goals/expectations and she would like to proceed. ICD -her St[employer] Jude device is working normally.  No therapies.    Danelle Brayton Baumgartner,MD

## 2024-05-14 NOTE — Discharge Instructions (Signed)
 After Your Cardiac Device   ACTIVITY Do not lift your arm above shoulder height for 1 week after your procedure. After 7 days, you may progress as below.  You should remove your sling 24 hours after your procedure, unless otherwise instructed by your provider.     Wednesday May 21, 2024  Thursday May 22, 2024 Friday May 23, 2024 Saturday May 24, 2024   Do not lift, push, pull, or carry anything over 10 pounds with the affected arm until 6 weeks (Wednesday June 25, 2024 ) after your procedure.   You may drive AFTER your wound check, unless you have been told otherwise by your provider.   Ask your healthcare provider when you can go back to work   INCISION/Dressing If you are on a blood thinner such as Coumadin, Xarelto , Eliquis, Plavix, or Pradaxa please confirm with your provider when this should be resumed.   If large square, outer bandage is left in place, this can be removed after 24 hours from your procedure. Do not remove steri-strips or glue as below.   If a PRESSURE DRESSING (a bulky dressing that usually goes up over your shoulder) was applied or left in place, please follow instructions given by your provider on when to return to have this removed.   Monitor your cardiac device site for redness, swelling, and drainage. Call the device clinic at [phone] if you experience these symptoms or fever/chills.  If your incision is sealed with Steri-strips or staples, you may shower 7 days after your procedure or when told by your provider. Do not remove the steri-strips or let the shower hit directly on your site. You may wash around your site with soap and water.    If you were discharged in a sling, please do not wear this during the day more than 48 hours after your surgery unless otherwise instructed. This may increase the risk of stiffness and soreness in your shoulder.   Avoid lotions, ointments, or perfumes over your incision until it is well-healed.  You may use a hot  tub or a pool AFTER your wound check appointment if the incision is completely closed.   DEVICE MANAGEMENT You should receive your ID card for your new device in 4-8 weeks. Keep this card with you at all times once received. Consider wearing a medical alert bracelet or necklace.  Please follow manufacture instructions for keeping your device charged carefully. This should be performed every 2 weeks at the very least.   Your cardiac device may be MRI compatible. This will be discussed at your next office visit/wound check.  You should avoid contact with strong electric or magnetic fields.   Do not use amateur (ham) radio equipment or electric (arc) welding torches. MP3 player headphones with magnets should not be used. Some devices are safe to use if held at least 12 inches (30 cm) from your cardiac device. These include power tools, lawn mowers, and speakers. If you are unsure if something is safe to use, ask your health care provider.  When using your cell phone, hold it to the ear that is on the opposite side from the cardiac device. Do not leave your cell phone in a pocket over the cardiac device.  You may safely use electric blankets, heating pads, computers, and microwave ovens.  Call the office right away if: You have chest pain. You feel more short of breath than you have felt before. You feel more light-headed than you have felt before. Your incision  starts to open up.  This information is not intended to replace advice given to you by your health care provider. Make sure you discuss any questions you have with your health care provider.

## 2024-05-15 ENCOUNTER — Encounter: Admitting: Occupational Therapy

## 2024-05-16 ENCOUNTER — Inpatient Hospital Stay

## 2024-05-16 ENCOUNTER — Inpatient Hospital Stay: Admitting: Oncology

## 2024-05-16 MED FILL — Midazolam HCl Inj 2 MG/2ML (Base Equivalent): INTRAMUSCULAR | Qty: 5 | Status: AC

## 2024-05-17 ENCOUNTER — Telehealth: Payer: Self-pay | Admitting: Cardiology

## 2024-05-17 NOTE — Telephone Encounter (Signed)
 Outpatient service line:  Patient reporting that she thinks that she is congested and has issues, rattling in her right lung?.  Her symptoms she is describing is a cough.  No fever, reports mild shortness of breath, no chills, nonproductive cough.  She just had a chest x-ray on the 25th that did not show any signs of pneumonia.  She is status post BiV upgrade with no signs of device infection with no erythema, warmth, abscess or anything that she describes.  Discussed with her, unable to evaluate this over the phone.  Encouraged her to seek follow-up with her PCP for the emergency room if she is concerned about her symptoms.

## 2024-05-20 ENCOUNTER — Inpatient Hospital Stay: Admitting: Physician Assistant

## 2024-05-20 ENCOUNTER — Encounter (HOSPITAL_COMMUNITY)

## 2024-05-20 ENCOUNTER — Inpatient Hospital Stay

## 2024-05-21 ENCOUNTER — Other Ambulatory Visit: Payer: Self-pay | Admitting: Orthopedic Surgery

## 2024-05-25 ENCOUNTER — Other Ambulatory Visit (HOSPITAL_COMMUNITY): Payer: Self-pay | Admitting: Cardiology

## 2024-05-28 ENCOUNTER — Encounter

## 2024-05-28 DIAGNOSIS — E785 Hyperlipidemia, unspecified: Secondary | ICD-10-CM | POA: Diagnosis not present

## 2024-05-28 DIAGNOSIS — E1121 Type 2 diabetes mellitus with diabetic nephropathy: Secondary | ICD-10-CM | POA: Diagnosis not present

## 2024-05-28 DIAGNOSIS — I129 Hypertensive chronic kidney disease with stage 1 through stage 4 chronic kidney disease, or unspecified chronic kidney disease: Secondary | ICD-10-CM | POA: Diagnosis not present

## 2024-05-28 DIAGNOSIS — R918 Other nonspecific abnormal finding of lung field: Secondary | ICD-10-CM | POA: Diagnosis not present

## 2024-05-28 DIAGNOSIS — R059 Cough, unspecified: Secondary | ICD-10-CM | POA: Diagnosis not present

## 2024-05-28 DIAGNOSIS — I700 Atherosclerosis of aorta: Secondary | ICD-10-CM | POA: Diagnosis not present

## 2024-05-28 DIAGNOSIS — R051 Acute cough: Secondary | ICD-10-CM | POA: Diagnosis not present

## 2024-05-28 DIAGNOSIS — N183 Chronic kidney disease, stage 3 unspecified: Secondary | ICD-10-CM | POA: Diagnosis not present

## 2024-05-28 DIAGNOSIS — J441 Chronic obstructive pulmonary disease with (acute) exacerbation: Secondary | ICD-10-CM | POA: Diagnosis not present

## 2024-05-28 DIAGNOSIS — I5022 Chronic systolic (congestive) heart failure: Secondary | ICD-10-CM | POA: Diagnosis not present

## 2024-05-28 DIAGNOSIS — I2510 Atherosclerotic heart disease of native coronary artery without angina pectoris: Secondary | ICD-10-CM | POA: Diagnosis not present

## 2024-05-29 ENCOUNTER — Ambulatory Visit

## 2024-06-03 ENCOUNTER — Ambulatory Visit

## 2024-06-05 ENCOUNTER — Ambulatory Visit: Admitting: Pharmacist

## 2024-06-05 ENCOUNTER — Telehealth: Payer: Self-pay | Admitting: Pharmacist

## 2024-06-05 ENCOUNTER — Other Ambulatory Visit (HOSPITAL_COMMUNITY): Payer: Self-pay

## 2024-06-05 MED ORDER — REPATHA SURECLICK 140 MG/ML ~~LOC~~ SOAJ
1.0000 mL | SUBCUTANEOUS | 3 refills | Status: AC
  Filled 2024-06-05 – 2024-06-23 (×2): qty 6, 84d supply, fill #0

## 2024-06-05 NOTE — Telephone Encounter (Signed)
 Patient had appointment scheduled with me today but was already started on Repatha  by Nason.  She she reports she is doing well on Repatha  but has had issues with [facility] getting it and she went about 4 weeks before her next dose.  She just got her new prescription.  Discussed using [facility].  She lives in [place] but the [place] pharmacy still a little bit of a distance from her.  Since she cannot drive I will get [facility] to deliver.  Will also send in a 90-day supply. I will ask was a lot scheduled this prescription to fill when it is due.  And get them to mark her for delivery.  She has met her Medicare Already this year therefore she has no co-pay.  She will need lipids in about 2 months since there was about 4-week gap between her last Repatha  injection

## 2024-06-05 NOTE — Progress Notes (Deleted)
 Patient ID: Katherine Larson                 DOB: 1946/10/15                    MRN: [id number]      HPI: Katherine Larson is a 78 y.o. female patient referred to lipid clinic by Caffie Shed, PA. PMH is significant for CAD s/p CABG in 1996 with sequential LIMA-diagonal and LAD. - LHC (5/21): totally occluded LAD with patent sequent LIMA-D and LAD, no significant LCx or RCA disease, HTN, HLD, CHF, COPD.    Reviewed options for lowering LDL cholesterol, including ezetimibe, PCSK-9 inhibitors, bempedoic acid and inclisiran.  Discussed mechanisms of action, dosing, side effects and potential decreases in LDL cholesterol.  Also reviewed cost information and potential options for patient assistance.   Current Medications: atorvastatin  80mg  daily, vascepa  1g twice a day Intolerances:  Risk Factors:  LDL-C goal:  ApoB goal:   Diet:   Exercise:   Family History:   Social History:   Labs: Lipid Panel     Component Value Date/Time   CHOL 191 03/17/2024 1433   TRIG 209 (H) 03/17/2024 1433   HDL 32 (L) 03/17/2024 1433   CHOLHDL 6.0 (H) 03/17/2024 1433   CHOLHDL 3.2 06/13/2023 1045   VLDL 23 06/13/2023 1045   LDLCALC 122 (H) 03/17/2024 1433   LABVLDL 37 03/17/2024 1433    Past Medical History:  Diagnosis Date   Anemia    anemia of chronic disease +/- IDA followed by Dr. Gatha. received iron  infusions in the past   Asthma    Borderline glaucoma    CAD (coronary artery disease)    a. s/p CABG 1996. b. low risk nuc 2011. c. LHC 04/2016 due to drop in EF -> occluded native LAD,  widely patent sequential LIMA-D1-LAD.   Chronic kidney disease    mild stage of kidney disease   Chronic systolic CHF (congestive heart failure) (HCC)    Complication of anesthesia    DM2 (diabetes mellitus, type 2) (HCC)    not on any medicine for this at this time, 05/2016   Dyspnea    with exertion   GERD (gastroesophageal reflux disease)    Gout    History of blood transfusion    History of  hiatal hernia    in 20s   History of pneumonia    March, 2016   HLD (hyperlipidemia)    HTN (hypertension)    Hyperthyroidism 01/21/2016   Inappropriate sinus tachycardia (HCC)    LBBB (left bundle branch block)    a. 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 Dx 04/2016 - EF 25-30%, diffuse HK, elevated LVEDP, mild MR, mod LAE.   Normocytic anemia 10/17/2021   NSVT (nonsustained ventricular tachycardia) (HCC)    PAT (paroxysmal atrial tachycardia) (HCC)    Pneumonia    PONV (postoperative nausea and vomiting)    after just about every surgery I've had   Rheumatoid arthritis (HCC)    RA- hands   Seasonal allergies     Current Outpatient Medications on File Prior to Visit  Medication Sig Dispense Refill   albuterol  (VENTOLIN  HFA) 108 (90 Base) MCG/ACT inhaler Inhale 1-2 puffs into the lungs every 6 (six) hours as  needed for wheezing or shortness of breath. 18 g 0   allopurinol  (ZYLOPRIM ) 300 MG tablet Take 300 mg by mouth daily.     aspirin  EC 81 MG EC tablet Take 1 tablet (81 mg total) by mouth daily.     atorvastatin  (LIPITOR ) 80 MG tablet Take 80 mg by mouth daily.     carvedilol  (COREG ) 6.25 MG tablet Take 1 tablet (6.25 mg total) by mouth 2 (two) times daily with a meal. 180 tablet 3   cholecalciferol (VITAMIN D3) 25 MCG (1000 UT) tablet Take 1,000 Units by mouth daily.     etanercept (ENBREL SURECLICK) 50 MG/ML injection Inject 50 mg into the skin once a week. For 30 days     Evolocumab  (REPATHA  SURECLICK) 140 MG/ML SOAJ ADMINISTER 1 ML UNDER THE SKIN EVERY 14 DAYS 2 mL 11   FARXIGA  10 MG TABS tablet TAKE 1 TABLET(10 MG) BY MOUTH DAILY BEFORE BREAKFAST 30 tablet 10   ferrous sulfate 325 (65 FE) MG tablet Take 325 mg by mouth daily with breakfast.     fexofenadine (ALLEGRA) 180 MG tablet Take 180 mg by mouth daily.      fluticasone  (FLONASE ) 50 MCG/ACT nasal spray Place 1 spray into both nostrils daily.     furosemide  (LASIX ) 40 MG tablet Take 0.5 tablets (20 mg total) by mouth daily. 45 tablet 3   gabapentin  (NEURONTIN ) 300 MG capsule Take 300 mg by mouth 2 (two) times daily.     lansoprazole  (PREVACID ) 30 MG capsule Take 1 capsule (30 mg total) by mouth daily before breakfast. (Patient taking differently: Take 30 mg by mouth 2 (two) times daily before a meal.) 90 capsule 3   leflunomide  (ARAVA ) 10 MG tablet Take 10 mg by mouth daily.     losartan  (COZAAR ) 100 MG tablet Take 100 mg by mouth daily.     MAGNESIUM -OXIDE 400 (240 Mg) MG tablet TAKE 1 TABLET(400 MG) BY MOUTH DAILY 30 tablet 2   meclizine (ANTIVERT) 12.5 MG tablet Take 12.5 mg by mouth 3 (three) times daily as needed for dizziness.     montelukast  (SINGULAIR ) 10 MG tablet Take 10 mg by mouth at bedtime.     Multiple Vitamins-Minerals (PRESERVISION AREDS 2) CAPS Take 1 capsule by mouth 2 (two) times daily.     nitroGLYCERIN  (NITROSTAT ) 0.4 MG SL tablet DISSOLVE 1 TABLET UNDER THE  TONGUE EVERY 5 MINUTES AS NEEDED FOR CHEST PAIN. MAX OF 3 TABLETS IN 15 MINUTES. CALL 911 IF PAIN  PERSISTS. 75 tablet 4   oxyCODONE -acetaminophen  (PERCOCET) 10-325 MG tablet Take 1 tablet by mouth in the morning, at noon, in the evening, and at bedtime.     sacubitril -valsartan  (ENTRESTO ) 24-26 MG Take 1 tablet by mouth 2 (two) times daily. 60 tablet 6   spironolactone  (ALDACTONE ) 25 MG tablet Take 25 mg by mouth daily.     TRELEGY ELLIPTA 200-62.5-25 MCG/ACT AEPB Inhale 1 puff into the lungs daily.     VASCEPA  1 g capsule TAKE 1 CAPSULE(1 GRAM) BY MOUTH TWICE DAILY 120 capsule 10   vitamin B-12 (CYANOCOBALAMIN ) 250 MCG tablet Take 250 mcg by mouth daily.     No current facility-administered medications on file prior to visit.    Allergies  Allergen Reactions   Biaxin [Clarithromycin] Rash and Other (See Comments)    Blisters in mouth    Penicillins Rash and Other  (See Comments)    Blisters in mouth Has patient had a PCN reaction causing immediate rash, facial/tongue/throat swelling, SOB  or lightheadedness with hypotension: Yesyes Has patient had a PCN reaction causing severe rash involving mucus membranes or skin necrosis: Nono Has patient had a PCN reaction that required hospitalization Nono Has patient had a PCN reaction occurring within the last 10 years: Nono If all of the above answers are NO, then may proceed   Hydroxychloroquine  Other (See Comments)   Sulfamethoxazole     Other Reaction(s): Other (See Comments)   Sulfasalazine Other (See Comments)   Losartan  Potassium     Other reaction(s): elevated creatinine    Assessment/Plan:  1. Hyperlipidemia -  No problem-specific Assessment & Plan notes found for this encounter.    Thank you,  Analicia Skibinski D Shephanie Romas, Pharm.Katherine Larson, CPP [facility] [address], [place], [place] [address]  Phone: [phone]; Fax: [phone]

## 2024-06-10 DIAGNOSIS — M1711 Unilateral primary osteoarthritis, right knee: Secondary | ICD-10-CM | POA: Diagnosis not present

## 2024-06-12 ENCOUNTER — Other Ambulatory Visit (HOSPITAL_COMMUNITY): Payer: Self-pay

## 2024-06-12 ENCOUNTER — Ambulatory Visit: Attending: Cardiology | Admitting: *Deleted

## 2024-06-12 DIAGNOSIS — I5022 Chronic systolic (congestive) heart failure: Secondary | ICD-10-CM | POA: Diagnosis not present

## 2024-06-12 MED ORDER — LOSARTAN POTASSIUM 100 MG PO TABS: 100.0000 mg | ORAL_TABLET | Freq: Every day | ORAL | 1 refills | Status: DC

## 2024-06-12 MED ORDER — SPIRONOLACTONE 25 MG PO TABS: 25.0000 mg | ORAL_TABLET | Freq: Every day | ORAL | 1 refills | Status: DC

## 2024-06-12 NOTE — Patient Instructions (Signed)
   After Your ICD (Implantable Cardiac Defibrillator)    Monitor your defibrillator site for redness, swelling, and drainage. Call the device clinic at [phone] if you experience these symptoms or fever/chills.  Your incision was closed with Steri-strips or staples:  You may shower 7 days after your procedure and wash your incision with soap and water. Avoid lotions, ointments, or perfumes over your incision until it is well-healed.  You may use a hot tub or a pool after your wound check appointment if the incision is completely closed.   Your ICD is designed to protect you from life threatening heart rhythms. Because of this, you may receive a shock.   1 shock with no symptoms:  Call the office during business hours. 1 shock with symptoms (chest pain, chest pressure, dizziness, lightheadedness, shortness of breath, overall feeling unwell):  Call 911. If you experience 2 or more shocks in 24 hours:  Call 911. If you receive a shock, you should not drive.  [place] DMV - no driving for 6 months if you receive appropriate therapy from your ICD.   ICD Alerts:  Some alerts are vibratory and others beep. These are NOT emergencies. Please call our office to let us know. If this occurs at night or on weekends, it can wait until the next business day. Send a remote transmission.  If your device is capable of reading fluid status (for heart failure), you will be offered monthly monitoring to review this with you.   Remote monitoring is used to monitor your ICD from home. This monitoring is scheduled every 91 days by our office. It allows us to keep an eye on the functioning of your device to ensure it is working properly. You will routinely see your Electrophysiologist annually (more often if necessary).  

## 2024-06-12 NOTE — Progress Notes (Signed)
 Normal CRT-D BiV ICD wound check. Wound well healed. Presenting rhythm: AS/BiVP. Routine testing performed. Thresholds, sensing, and impedances consistent with implant measurements with 3.5V safety margin/auto capture until 3 month visit. No episodes or treated arrhythmias. Reviewed arm restrictions to continue for 6 weeks total post op. Reviewed shock plan.  Pt enrolled in remote follow-up.

## 2024-06-13 DIAGNOSIS — D649 Anemia, unspecified: Secondary | ICD-10-CM | POA: Diagnosis not present

## 2024-06-13 DIAGNOSIS — Z6821 Body mass index (BMI) 21.0-21.9, adult: Secondary | ICD-10-CM | POA: Diagnosis not present

## 2024-06-13 DIAGNOSIS — M1009 Idiopathic gout, multiple sites: Secondary | ICD-10-CM | POA: Diagnosis not present

## 2024-06-13 DIAGNOSIS — Z79899 Other long term (current) drug therapy: Secondary | ICD-10-CM | POA: Diagnosis not present

## 2024-06-13 DIAGNOSIS — M1991 Primary osteoarthritis, unspecified site: Secondary | ICD-10-CM | POA: Diagnosis not present

## 2024-06-13 DIAGNOSIS — M0579 Rheumatoid arthritis with rheumatoid factor of multiple sites without organ or systems involvement: Secondary | ICD-10-CM | POA: Diagnosis not present

## 2024-06-13 NOTE — Progress Notes (Addendum)
 Anesthesia Review:  PCP: Montie Pizza  Cardiologist : Ezra Shuck CHF office visit- 04/17/24  EP- DR Danelle Birmingham  Pulm- Dr Theophilus - LOV 03/28/24  Oncology- DR Lamon  Farley Kidney - LOV 05/12/24,.   PPM/ ICD: BiV upgrade on 05/14/24 - DR Danelle Birmingham - area has healed at preop appt on 06/18/24.  05/02/24- Device check  Wound check- 06/12/24  Device Orders: Requested on 06/18/24  On front of chart on 06/18/24.  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     PT had blood transfusion on 06/16/24.     PT has Spinal cord Stimulator - PT to bring Remote.    CBC with hgb of 10.0 on 06/18/24 routed to Dr Norleen Gavel.    PT called on 06/25/2024 and reminded to bring Spinal cord remote for Stimulator on day of surgery.  PT voiced understanding.

## 2024-06-14 NOTE — Progress Notes (Unsigned)
 [facility] [address], [place] [address]   CLINIC:  Medical Oncology/Hematology  PCP:  Teresa Channel, MD [address] [place] [address] [phone]   REASON FOR VISIT:  Follow-up for anemia of CKD/functional iron  deficiency + leukocytosis   CURRENT THERAPY: Intermittent IV iron  + Retacrit  10,000 units monthly  INTERVAL HISTORY:   Katherine Larson 78 y.o. female returns for routine follow-up of anemia of CKD/functional iron  deficiency. She was last seen by NP Delon Hope on 01/24/2024. 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  She continues to receive monthly Retacrit  (last on 04/18/2024 due to missed appointment in June). She is tolerating well without any major side effects.  Blood pressure has been at goal.  No symptoms concerning for DVT or PE.   She  denies any bleeding episodes.   No pica, chest pain, syncope, or dyspnea on exertion.    She denies any B symptoms.  No new lumps or bumps.   She has 50% energy and 75% appetite. She endorses that she is maintaining a stable weight.  ASSESSMENT & PLAN:  1.  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   - No history of blood transfusion. - Intermittent IV iron , most recent Feraheme  on 08/03/2023 - Currently taking iron  supplement Nu-iron  once daily.   - Reports episode of dark stool in the summer 2022, and followed with GI.   - EGD (06/03/2021 by Dr. Shaaron): Normal esophagus, stomach, duodenum - Colonoscopy (04/27/2020 by Dr. Shaaron): Internal hemorrhoids and diverticulosis - Hemoccult stool x3 negative (December 2022) - Work-up of anemia (10/17/2021): Iron  saturation 15% but elevated ferritin 329 (high suspicion that elevated ferritin was acute phase reactant, as multiple inflammatory markers were also positive and patient has underlying RA).   Normal folate, copper ,  methylmalonic acid.  Elevated B12 at 2718 (likely acute phase reactant).  SPEP negative for M spike.  Immunofixation negative for monoclonal protein.  Mildly elevated kappa light chains 41.1/normal lambda 25.6, normal ratio 1.61. - No bright red blood per rectum or melena - Retacrit  initiated December 2024.  Current dose of Retacrit  10,000 units every 4 weeks. - Most recent labs (06/16/2024): Hgb 9.8/MCV 104.2.  Mild thrombocytopenia noted with platelets 126.  Ferritin 283, iron  saturation 39% with TIBC 351.  Creatinine 0.99/GFR 59 - DIFFERENTIAL DIAGNOSIS: Anemia related to CKD, chronic inflammation (rheumatoid arthritis) and medication (leflunomide ) - PLAN: Continue CBC and Retacrit  10,000 units every 4 weeks. - RTC in 3 months with CBC/D, CMP, ferritin, iron /TIBC - Mild thrombocytopenia noted.  Will check B12, MMA, folate, and copper  with next labs.  (CT abdomen/pelvis from January 2024 showed normal liver and spleen.)  2.  Leukocytosis, reactive - She has a longstanding history of intermittent leukocytosis since at least 2014, felt to be reactive leukocytosis - BCR/ABL by PCR testing was negative in January 2014 - JAK2 with reflex was negative - Inflammatory markers positive (10/17/2021): CRP 2.4, ESR 76, elevated B12 and ferritin - She had COVID-19 infection in November 2022. - She uses steroid inhaler (Trelegy) and intermittent Kenalog  cream. - She is a current non-smoker. - She has underlying rheumatoid arthritis.  She is on leflunomide , which is managed by Dr. Jon Jacob. - She denies any B symptoms such as fever, chills, night sweats, unintentional weight loss.  No new lumps or bumps.  - Differential diagnosis favors  reactive leukocytosis in the setting of rheumatoid arthritis and chronic disease - PLAN: No further work-up or intervention at this time.    3.  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 Negative for appetite change, chills, diaphoresis, fever and unexpected weight change.  HENT:   Negative for lump/mass and nosebleeds.   Eyes:  Negative for eye problems.  Respiratory:  Negative for cough, hemoptysis and shortness of breath.   Cardiovascular:  Negative for chest pain, leg swelling and palpitations.  Gastrointestinal:  Negative for abdominal pain, blood in stool, constipation, diarrhea, nausea and vomiting.  Genitourinary:  Negative for hematuria.   Musculoskeletal:  Positive for arthralgias and back pain.  Skin: Negative.   Neurological:  Positive for headaches. Negative for dizziness and light-headedness.  Hematological:  Does not bruise/bleed easily.     PHYSICAL EXAM:  ECOG PERFORMANCE STATUS: 2 - Symptomatic, <50% confined to bed  Vitals:   06/16/24 0955  BP: 129/75  Pulse: 77  Resp: 16  Temp: 98.2 F (36.8 C)  SpO2: 92%   Filed Weights   06/16/24 0955  Weight: 128 lb 4.9 oz (58.2 kg)   Physical Exam Constitutional:      Appearance: Normal appearance. She is normal weight.  Cardiovascular:     Heart sounds: Normal heart sounds.  Pulmonary:     Breath sounds: Normal breath sounds.  Neurological:     General: No focal deficit present.     Mental Status: Mental status is at baseline.  Psychiatric:        Behavior: Behavior normal. Behavior is cooperative.    PAST MEDICAL/SURGICAL HISTORY:  Past Medical History:  Diagnosis Date   Anemia    anemia of chronic disease +/- IDA followed by Dr.  Gatha. received iron  infusions in the past   Asthma    Borderline glaucoma    CAD (coronary artery disease)    a. s/p CABG 1996. b. low risk nuc 2011. c. LHC 04/2016 due to drop in EF -> occluded native LAD,  widely patent sequential LIMA-D1-LAD.   Chronic kidney disease    mild stage of kidney disease   Chronic systolic CHF (congestive heart failure) (HCC)    Complication of anesthesia    DM2 (diabetes mellitus, type 2) (HCC)    not on any medicine for this at this time, 05/2016   Dyspnea    with exertion   GERD (gastroesophageal reflux disease)    Gout    History of blood transfusion    History of hiatal hernia    in 20s   History of pneumonia    March, 2016   HLD (hyperlipidemia)    HTN (hypertension)    Hyperthyroidism 01/21/2016   Inappropriate sinus tachycardia (HCC)    LBBB (left bundle branch block)    a. 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 Dx 04/2016 - EF 25-30%, diffuse HK, elevated LVEDP, mild MR, mod LAE.   Normocytic anemia 10/17/2021   NSVT (nonsustained ventricular tachycardia) (HCC)    PAT (paroxysmal atrial tachycardia) (HCC)    Pneumonia    PONV (postoperative nausea and vomiting)    after just about every surgery I've had   Rheumatoid arthritis (HCC)    RA- hands   Seasonal allergies    Past Surgical History:  Procedure Laterality Date   ABDOMINAL HYSTERECTOMY     APPENDECTOMY     BACK SURGERY     BIOPSY  04/27/2020   Procedure: BIOPSY;  Surgeon: Shaaron Lamar HERO, MD;  [facility]: [facility];  Service: Endoscopy;;  ascending colon biopsy   BIV UPGRADE N/A 05/14/2024   Procedure: BIV UPGRADE;  Surgeon: Waddell Danelle ORN, MD;  Location: [facility] INVASIVE CV LAB;  Service: Cardiovascular;  Laterality: N/A;   CARDIAC CATHETERIZATION N/A 05/01/2016   Procedure: Right/Left Heart Cath and Coronary/Graft  Angiography;  Surgeon: Alm ORN Clay, MD;  Location: [facility] INVASIVE CV LAB;  Service: Cardiovascular;  Laterality: N/A;   CHOLECYSTECTOMY     COLONOSCOPY  11/2011   Dr. Magod:ext/int hemorrhoids, diverticulosis sigmoid colon and distal desc colon, mid-desc colon hyperplastic polyps.    COLONOSCOPY WITH PROPOFOL  N/A 04/27/2020   Rourk: Diverticulosis, hemorrhoids, normal terminal ileum.  Random colon biopsies significant for chronic lymphocytic colitis. No repeat due to age.   CORONARY ARTERY BYPASS GRAFT  1996   2 vessels   ESOPHAGOGASTRODUODENOSCOPY  11/2011   Dr. Rosalie: small hiatal hernia, one non-bleeding superficial gastric ulcer, medium-sized diverticulum in area of papilla   ESOPHAGOGASTRODUODENOSCOPY N/A 12/29/2015   Dr. Shaaron: Chronic inactive gastritis, normal esophagus status post dilation, duodenal diverticula   ESOPHAGOGASTRODUODENOSCOPY (EGD) WITH PROPOFOL  N/A 07/24/2019   Dr. Shaaron: Normal esophagus status post empiric dilation, small hiatal hernia, large D2 diverticulum   ESOPHAGOGASTRODUODENOSCOPY (EGD) WITH PROPOFOL  N/A 06/03/2021   Surgeon: Shaaron Lamar HERO, MD; normal esophagus, normal stomach, normal duodenum.  Abnormal hypopharyngeal mucosa bilaterally just distal to the base of the tongue (right side greater than left), overlying mucosa appeared irregular and somewhat verrucous in appearance.  Recommended ENT evaluation.   EYE SURGERY Bilateral    cataract removal   FOOT SURGERY     removal bone spur   FRACTURE SURGERY Right    arm   GIVENS CAPSULE STUDY  11/2012   Dr. Rosalie: minimal gastritis, normal small bowel capsule   HERNIA REPAIR     hiatal hernia   ICD IMPLANT N/A 02/04/2021   Procedure: ICD IMPLANT;  Surgeon: Waddell Danelle ORN, MD;  Location: [facility] INVASIVE CV LAB;  Service: Cardiovascular;  Laterality: N/A;   MALONEY DILATION N/A 07/24/2019   Procedure: AGAPITO DILATION;  Surgeon: Shaaron Lamar HERO, MD;  [facility];  Service: Endoscopy;  Laterality:  N/A;   MALONEY DILATION N/A 06/03/2021   Procedure: AGAPITO DILATION;  Surgeon: Shaaron Lamar HERO, MD;  [facility]: [facility];  Service: Endoscopy;  Laterality: N/A;   MAXIMUM ACCESS (MAS)POSTERIOR LUMBAR INTERBODY FUSION (PLIF) 1 LEVEL N/A 08/14/2013   Procedure:  MAXIMUM ACCESS SURGERY(MAS) POSTERIOR LUMBAR INTERBODY FUSION LUMBAR THREE-FOUR ;  Surgeon: Alm GORMAN Molt, MD;  Location: [facility] NEURO ORS;  Service: Neurosurgery;  Laterality: N/A;   MAXIMUM ACCESS SURGERY(MAS) POSTERIOR LUMBAR INTERBODY FUSION LUMBAR THREE-FOUR    PTCA     RIGHT/LEFT HEART CATH AND CORONARY ANGIOGRAPHY N/A 03/26/2020   Procedure: RIGHT/LEFT HEART CATH  AND CORONARY ANGIOGRAPHY;  Surgeon: Anner Alm ORN, MD;  Location: [facility] INVASIVE CV LAB;  Service: Cardiovascular;  Laterality: N/A;   SPINAL CORD STIMULATOR BATTERY EXCHANGE N/A 01/19/2021   Procedure: Spinal cord stimulator battery change;  Surgeon: Darlis Deatrice RAMAN, MD;  Location: [facility] OR;  Service: Neurosurgery;  Laterality: N/A;   SPINAL CORD STIMULATOR INSERTION N/A 06/16/2016   Procedure: LUMBAR SPINAL CORD STIMULATOR INSERTION;  Surgeon: Deward Fabian, MD;  Location: [facility];  Service: Neurosurgery;  Laterality: N/A;  LUMBAR SPINAL CORD STIMULATOR INSERTION   TEE WITHOUT CARDIOVERSION N/A 02/01/2023   Procedure: TRANSESOPHAGEAL ECHOCARDIOGRAM (TEE);  Surgeon: Debera Jayson MATSU, MD;  Location: [facility] ORS;  Service: Cardiovascular;  Laterality: N/A;   tens  05/2016   TONSILLECTOMY      SOCIAL HISTORY:  Social History   Socioeconomic History   Marital status: Married    Spouse name: Not on file   Number of children: 3   Years of education: Not on file   Highest education level: Not on file  Occupational History   Not on file  Tobacco Use   Smoking status: Former    Current packs/day: 0.00    Average packs/day: 0.8 packs/day for 15.0 years (11.3 ttl pk-yrs)    Types: Cigarettes    Start date: 11/21/1979    Quit date: 11/20/1994    Years since quitting: 29.5    Smokeless tobacco: Never   Tobacco comments:    Quit in 1996  Vaping Use   Vaping status: Never Used  Substance and Sexual Activity   Alcohol  use: Never    Alcohol /week: 0.0 standard drinks of alcohol    Drug use: Never   Sexual activity: Yes  Other Topics Concern   Not on file  Social History Narrative   2 living children, one deceased age 82   Right handed   One story home   Drinks coffee am   Social Drivers of Corporate investment banker Strain: Not on file  Food Insecurity: No Food Insecurity (01/28/2023)   Hunger Vital Sign    Worried About Running Out of Food in the Last Year: Never true    Ran Out of Food in the Last Year: Never true  Transportation Needs: No Transportation Needs (01/28/2023)   PRAPARE - Administrator, Civil Service (Medical): No    Lack of Transportation (Non-Medical): No  Physical Activity: Not on file  Stress: Not on file  Social Connections: Not on file  Intimate Partner Violence: Not At Risk (01/28/2023)   Humiliation, Afraid, Rape, and Kick questionnaire    Fear of Current or Ex-Partner: No    Emotionally Abused: No    Physically Abused: No    Sexually Abused: No    FAMILY HISTORY:  Family History  Problem Relation Age of Onset   Heart attack Father    Heart attack Mother    Bladder Cancer Brother    Cervical cancer Daughter        ?   Colon cancer Neg Hx     CURRENT MEDICATIONS:  Outpatient Encounter Medications as of 06/16/2024  Medication Sig Note   albuterol  (VENTOLIN  HFA) 108 (90 Base) MCG/ACT inhaler Inhale 1-2 puffs into the lungs every 6 (six) hours as needed for wheezing or shortness of breath.    allopurinol  (ZYLOPRIM ) 300 MG tablet Take 300 mg by mouth daily.    aspirin  EC 81 MG EC tablet Take 1 tablet (81 mg total) by mouth daily.  atorvastatin  (LIPITOR ) 80 MG tablet Take 80 mg by mouth daily.    carvedilol  (COREG ) 6.25 MG tablet Take 1 tablet (6.25 mg total) by mouth 2 (two) times daily with a meal.     cholecalciferol (VITAMIN D3) 25 MCG (1000 UT) tablet Take 1,000 Units by mouth daily.    etanercept (ENBREL SURECLICK) 50 MG/ML injection Inject 50 mg into the skin every Monday.    Evolocumab  (REPATHA  SURECLICK) 140 MG/ML SOAJ Inject 140 mg into the skin every 14 (fourteen) days.    FARXIGA  10 MG TABS tablet TAKE 1 TABLET(10 MG) BY MOUTH DAILY BEFORE BREAKFAST    ferrous sulfate 325 (65 FE) MG tablet Take 325 mg by mouth daily with breakfast.    fexofenadine (ALLEGRA) 180 MG tablet Take 180 mg by mouth daily.    fluticasone  (FLONASE ) 50 MCG/ACT nasal spray Place 1 spray into both nostrils daily.    furosemide  (LASIX ) 40 MG tablet Take 0.5 tablets (20 mg total) by mouth daily.    gabapentin  (NEURONTIN ) 300 MG capsule Take 300 mg by mouth 2 (two) times daily.    lansoprazole  (PREVACID ) 30 MG capsule Take 1 capsule (30 mg total) by mouth daily before breakfast. (Patient taking differently: Take 30 mg by mouth 2 (two) times daily before a meal.)    leflunomide  (ARAVA ) 10 MG tablet Take 10 mg by mouth daily.    losartan  (COZAAR ) 100 MG tablet Take 1 tablet (100 mg total) by mouth daily.    MAGNESIUM -OXIDE 400 (240 Mg) MG tablet TAKE 1 TABLET(400 MG) BY MOUTH DAILY    meclizine (ANTIVERT) 12.5 MG tablet Take 12.5 mg by mouth 3 (three) times daily as needed for dizziness.    montelukast  (SINGULAIR ) 10 MG tablet Take 10 mg by mouth at bedtime.    Multiple Vitamins-Minerals (PRESERVISION AREDS 2) CAPS Take 1 capsule by mouth 2 (two) times daily.    nitroGLYCERIN  (NITROSTAT ) 0.4 MG SL tablet DISSOLVE 1 TABLET UNDER THE  TONGUE EVERY 5 MINUTES AS NEEDED FOR CHEST PAIN. MAX OF 3 TABLETS IN 15 MINUTES. CALL 911 IF PAIN  PERSISTS.    oxyCODONE -acetaminophen  (PERCOCET) 10-325 MG tablet Take 1 tablet by mouth in the morning, at noon, in the evening, and at bedtime.    sacubitril -valsartan  (ENTRESTO ) 24-26 MG Take 1 tablet by mouth 2 (two) times daily.    spironolactone  (ALDACTONE ) 25 MG tablet Take 1 tablet (25  mg total) by mouth daily.    TRELEGY ELLIPTA 200-62.5-25 MCG/ACT AEPB Inhale 1 puff into the lungs daily.    VASCEPA  1 g capsule TAKE 1 CAPSULE(1 GRAM) BY MOUTH TWICE DAILY    vitamin B-12 (CYANOCOBALAMIN ) 250 MCG tablet Take 250 mcg by mouth daily.    [DISCONTINUED] losartan  (COZAAR ) 100 MG tablet Take 100 mg by mouth daily. 05/13/2024: On hold for the procedure   [DISCONTINUED] spironolactone  (ALDACTONE ) 25 MG tablet Take 25 mg by mouth daily. 05/13/2024: On hold   No facility-administered encounter medications on file as of 06/16/2024.    ALLERGIES:  Allergies  Allergen Reactions   Biaxin [Clarithromycin] Rash and Other (See Comments)    Blisters in mouth    Penicillins Rash and Other (See Comments)    Blisters in mouth Has patient had a PCN reaction causing immediate rash, facial/tongue/throat swelling, SOB or lightheadedness with hypotension: Yesyes Has patient had a PCN reaction causing severe rash involving mucus membranes or skin necrosis: Nono Has patient had a PCN reaction that required hospitalization Nono Has patient had a PCN reaction occurring  within the last 10 years: Nono If all of the above answers are NO, then may proceed   Hydroxychloroquine  Other (See Comments)   Sulfamethoxazole     Other Reaction(s): Other (See Comments)   Sulfasalazine Other (See Comments)   Losartan  Potassium     Other reaction(s): elevated creatinine    LABORATORY DATA:  I have reviewed the labs as listed.  CBC    Component Value Date/Time   WBC 7.6 06/16/2024 0900   RBC 3.06 (L) 06/16/2024 0900   HGB 9.8 (L) 06/16/2024 0900   HGB 10.8 (L) 11/29/2022 0931   HGB 12.4 11/01/2015 1134   HCT 31.9 (L) 06/16/2024 0900   HCT 33.2 (L) 11/29/2022 0931   HCT 37.4 11/01/2015 1134   PLT 126 (L) 06/16/2024 0900   PLT 229 11/29/2022 0931   MCV 104.2 (H) 06/16/2024 0900   MCV 97 11/29/2022 0931   MCV 91.7 11/01/2015 1134   MCH 32.0 06/16/2024 0900   MCHC 30.7 06/16/2024 0900   RDW 15.1  06/16/2024 0900   RDW 13.1 11/29/2022 0931   RDW 15.0 (H) 11/01/2015 1134   LYMPHSABS 2.8 06/16/2024 0900   LYMPHSABS 2.6 11/29/2022 0931   LYMPHSABS 2.9 11/01/2015 1134   MONOABS 0.6 06/16/2024 0900   MONOABS 0.7 11/01/2015 1134   EOSABS 0.3 06/16/2024 0900   EOSABS 0.3 11/29/2022 0931   BASOSABS 0.0 06/16/2024 0900   BASOSABS 0.0 11/29/2022 0931   BASOSABS 0.0 11/01/2015 1134      Latest Ref Rng & Units 06/16/2024    9:00 AM 04/25/2024   12:36 PM 04/17/2024   11:39 AM  CMP  Glucose 70 - 99 mg/dL 877  889  891   BUN 8 - 23 mg/dL 27  27  32   Creatinine 0.44 - 1.00 mg/dL 9.00  8.81  8.74   Sodium 135 - 145 mmol/L 136  140  136   Potassium 3.5 - 5.1 mmol/L 4.2  4.7  5.5   Chloride 98 - 111 mmol/L 100  102  101   CO2 22 - 32 mmol/L 27  26  27    Calcium  8.9 - 10.3 mg/dL 9.0  9.6  9.3   Total Protein 6.5 - 8.1 g/dL 6.5     Total Bilirubin 0.0 - 1.2 mg/dL 0.6     Alkaline Phos 38 - 126 U/L 66     AST 15 - 41 U/L 20     ALT 0 - 44 U/L 21       DIAGNOSTIC IMAGING:  I have independently reviewed the relevant imaging and discussed with the patient.   WRAP UP:  All questions were answered. The patient knows to call the clinic with any problems, questions or concerns.  Medical decision making: Moderate  Time spent on visit: I spent 20 minutes counseling the patient face to face. The total time spent in the appointment was 30 minutes and more than 50% was on counseling.  Pleasant Katherine Barefoot, PA-C  06/16/24 10:25 AM

## 2024-06-16 ENCOUNTER — Inpatient Hospital Stay (HOSPITAL_BASED_OUTPATIENT_CLINIC_OR_DEPARTMENT_OTHER): Admitting: Physician Assistant

## 2024-06-16 ENCOUNTER — Inpatient Hospital Stay

## 2024-06-16 ENCOUNTER — Inpatient Hospital Stay: Attending: Hematology

## 2024-06-16 VITALS — BP 129/75 | HR 77 | Temp 98.2°F | Resp 16 | Wt 128.3 lb

## 2024-06-16 DIAGNOSIS — N1831 Chronic kidney disease, stage 3a: Secondary | ICD-10-CM | POA: Insufficient documentation

## 2024-06-16 DIAGNOSIS — I129 Hypertensive chronic kidney disease with stage 1 through stage 4 chronic kidney disease, or unspecified chronic kidney disease: Secondary | ICD-10-CM | POA: Diagnosis not present

## 2024-06-16 DIAGNOSIS — D649 Anemia, unspecified: Secondary | ICD-10-CM

## 2024-06-16 DIAGNOSIS — D631 Anemia in chronic kidney disease: Secondary | ICD-10-CM | POA: Diagnosis not present

## 2024-06-16 DIAGNOSIS — D696 Thrombocytopenia, unspecified: Secondary | ICD-10-CM

## 2024-06-16 DIAGNOSIS — D72828 Other elevated white blood cell count: Secondary | ICD-10-CM | POA: Diagnosis not present

## 2024-06-16 LAB — CBC WITH DIFFERENTIAL/PLATELET
Abs Immature Granulocytes: 0.03 K/uL (ref 0.00–0.07)
Basophils Absolute: 0 K/uL (ref 0.0–0.1)
Basophils Relative: 0 %
Eosinophils Absolute: 0.3 K/uL (ref 0.0–0.5)
Eosinophils Relative: 4 %
HCT: 31.9 % — ABNORMAL LOW (ref 36.0–46.0)
Hemoglobin: 9.8 g/dL — ABNORMAL LOW (ref 12.0–15.0)
Immature Granulocytes: 0 %
Lymphocytes Relative: 37 %
Lymphs Abs: 2.8 K/uL (ref 0.7–4.0)
MCH: 32 pg (ref 26.0–34.0)
MCHC: 30.7 g/dL (ref 30.0–36.0)
MCV: 104.2 fL — ABNORMAL HIGH (ref 80.0–100.0)
Monocytes Absolute: 0.6 K/uL (ref 0.1–1.0)
Monocytes Relative: 7 %
Neutro Abs: 3.9 K/uL (ref 1.7–7.7)
Neutrophils Relative %: 52 %
Platelets: 126 K/uL — ABNORMAL LOW (ref 150–400)
RBC: 3.06 MIL/uL — ABNORMAL LOW (ref 3.87–5.11)
RDW: 15.1 % (ref 11.5–15.5)
WBC: 7.6 K/uL (ref 4.0–10.5)
nRBC: 0 % (ref 0.0–0.2)

## 2024-06-16 LAB — COMPREHENSIVE METABOLIC PANEL WITH GFR
ALT: 21 U/L (ref 0–44)
AST: 20 U/L (ref 15–41)
Albumin: 3.5 g/dL (ref 3.5–5.0)
Alkaline Phosphatase: 66 U/L (ref 38–126)
Anion gap: 9 (ref 5–15)
BUN: 27 mg/dL — ABNORMAL HIGH (ref 8–23)
CO2: 27 mmol/L (ref 22–32)
Calcium: 9 mg/dL (ref 8.9–10.3)
Chloride: 100 mmol/L (ref 98–111)
Creatinine, Ser: 0.99 mg/dL (ref 0.44–1.00)
GFR, Estimated: 59 mL/min — ABNORMAL LOW (ref >60)
Glucose, Bld: 122 mg/dL — ABNORMAL HIGH (ref 70–99)
Potassium: 4.2 mmol/L (ref 3.5–5.1)
Sodium: 136 mmol/L (ref 135–145)
Total Bilirubin: 0.6 mg/dL (ref 0.0–1.2)
Total Protein: 6.5 g/dL (ref 6.5–8.1)

## 2024-06-16 LAB — FERRITIN: Ferritin: 283 ng/mL (ref 11–307)

## 2024-06-16 LAB — IRON AND TIBC
Iron: 136 ug/dL (ref 28–170)
Saturation Ratios: 39 % — ABNORMAL HIGH (ref 10.4–31.8)
TIBC: 351 ug/dL (ref 250–450)
UIBC: 215 ug/dL

## 2024-06-16 MED ORDER — EPOETIN ALFA-EPBX 10000 UNIT/ML IJ SOLN
10000.0000 [IU] | Freq: Once | INTRAMUSCULAR | Status: AC
  Administered 2024-06-16: 10000 [IU] via SUBCUTANEOUS
  Filled 2024-06-16: qty 1

## 2024-06-16 NOTE — Patient Instructions (Signed)
 [facility] [place] - A DEPT OF [facility]  Discharge Instructions: Thank you for choosing [facility] to provide your oncology and hematology care.  If you have a lab appointment with the Cancer Center - please note that after April 8th, 2024, all labs will be drawn in the cancer center.  You do not have to check in or register with the main entrance as you have in the past but will complete your check-in in the cancer center.  Wear comfortable clothing and clothing appropriate for easy access to any Portacath or PICC line.   We strive to give you quality time with your provider. You may need to reschedule your appointment if you arrive late (15 or more minutes).  Arriving late affects you and other patients whose appointments are after yours.  Also, if you miss three or more appointments without notifying the office, you may be dismissed from the clinic at the provider's discretion.      For prescription refill requests, have your pharmacy contact our office and allow 72 hours for refills to be completed.    Today you received Retacrit 10,000U injection.     BELOW ARE SYMPTOMS THAT SHOULD BE REPORTED IMMEDIATELY: *FEVER GREATER THAN 100.4 F (38 C) OR HIGHER *CHILLS OR SWEATING *NAUSEA AND VOMITING THAT IS NOT CONTROLLED WITH YOUR NAUSEA MEDICATION *UNUSUAL SHORTNESS OF BREATH *UNUSUAL BRUISING OR BLEEDING *URINARY PROBLEMS (pain or burning when urinating, or frequent urination) *BOWEL PROBLEMS (unusual diarrhea, constipation, pain near the anus) TENDERNESS IN MOUTH AND THROAT WITH OR WITHOUT PRESENCE OF ULCERS (sore throat, sores in mouth, or a toothache) UNUSUAL RASH, SWELLING OR PAIN  UNUSUAL VAGINAL DISCHARGE OR ITCHING   Items with * indicate a potential emergency and should be followed up as soon as possible or go to the Emergency Department if any problems should occur.  Please show the CHEMOTHERAPY ALERT CARD or IMMUNOTHERAPY ALERT CARD at  check-in to the Emergency Department and triage nurse.  Should you have questions after your visit or need to cancel or reschedule your appointment, please contact [facility] [place] - A DEPT OF [facility] [phone]  and follow the prompts.  Office hours are 8:00 a.m. to 4:30 p.m. Monday - Friday. Please note that voicemails left after 4:00 p.m. may not be returned until the following business day.  We are closed weekends and major holidays. You have access to a nurse at all times for urgent questions. Please call the main number to the clinic [phone] and follow the prompts.  For any non-urgent questions, you may also contact your provider using MyChart. We now offer e-Visits for anyone 75 and older to request care online for non-urgent symptoms. For details visit [email or web address].   Also download the MyChart app! Go to the app store, search "MyChart", open the app, select [facility], and log in with your MyChart username and password.

## 2024-06-16 NOTE — Patient Instructions (Signed)
 [facility] at [facility] **VISIT SUMMARY & IMPORTANT INSTRUCTIONS **   You were seen today by Pleasant Barefoot PA-C for your anemia. Your anemia is related to your chronic kidney disease and chronic inflammation. You will continue to receive Retacrit  injections once every 4 weeks. Retacrit  is a shot that helps your body to make more healthy blood cells.  It is primarily used in patients with anemia related to chronic kidney disease. Your labs showed mildly low platelets.  We will check your B12, folate, and copper  levels with your next labs.  FOLLOW-UP APPOINTMENT: 3 months  ** Thank you for trusting me with your healthcare!  I strive to provide all of my patients with quality care at each visit.  If you receive a survey for this visit, I would be so grateful to you for taking the time to provide feedback.  Thank you in advance!  ~ Ainhoa Rallo                   Dr. Alean Stands   &   Pleasant Barefoot, PA-C   - - - - - - - - - - - - - - - - - -    Thank you for choosing [facility] at [facility] to provide your oncology and hematology care.  To afford each patient quality time with our provider, please arrive at least 15 minutes before your scheduled appointment time.   If you have a lab appointment with the Cancer Center please come in thru the Main Entrance and check in at the main information desk.  You need to re-schedule your appointment should you arrive 10 or more minutes late.  We strive to give you quality time with our providers, and arriving late affects you and other patients whose appointments are after yours.  Also, if you no show three or more times for appointments you may be dismissed from the clinic at the providers discretion.     Again, thank you for choosing [facility].  Our hope is that these requests will decrease the amount of time that you wait before being seen by our physicians.        _____________________________________________________________  Should you have questions after your visit to [facility], please contact our office at [phone] and follow the prompts.  Our office hours are 8:00 a.m. and 4:30 p.m. Monday - Friday.  Please note that voicemails left after 4:00 p.m. may not be returned until the following business day.  We are closed weekends and major holidays.  You do have access to a nurse 24-7, just call the main number to the clinic [phone] and do not press any options, hold on the line and a nurse will answer the phone.    For prescription refill requests, have your pharmacy contact our office and allow 72 hours.

## 2024-06-16 NOTE — Patient Instructions (Signed)
 SURGICAL WAITING ROOM VISITATION  Patients having surgery or a procedure may have no more than 2 support people in the waiting area - these visitors may rotate.    Children under the age of 72 must have an adult with them who is not the patient.  Visitors with respiratory illnesses are discouraged from visiting and should remain at home.  If the patient needs to stay at the hospital during part of their recovery, the visitor guidelines for inpatient rooms apply. Pre-op  nurse will coordinate an appropriate time for 1 support person to accompany patient in pre-op .  This support person may not rotate.    Please refer to the [facility] website for the visitor guidelines for Inpatients (after your surgery is over and you are in a regular room).       Your procedure is scheduled on:  06/30/2024    Report to [facility] Main Entrance    Report to admitting at   [phone]   Call this number if you have problems the morning of surgery [phone]   Do not eat food :After Midnight.   After Midnight you may have the following liquids until __0645____ AM DAY OF SURGERY  Water Non-Citrus Juices (without pulp, NO RED-Apple, White grape, White cranberry) Black Coffee (NO MILK/CREAM OR CREAMERS, sugar ok)  Clear Tea (NO MILK/CREAM OR CREAMERS, sugar ok) regular and decaf                             Plain Jell-O (NO RED)                                           Fruit ices (not with fruit pulp, NO RED)                                     Popsicles (NO RED)                                                               Sports drinks like Gatorade (NO RED)                    The day of surgery:  Drink ONE (1) Pre-Surgery Clear Ensure or G2 at  0645AM the morning of surgery. Drink in one sitting. Do not sip.  This drink was given to you during your hospital  pre-op  appointment visit. Nothing else to drink after completing the  Pre-Surgery Clear Ensure or G2.          If you have  questions, please contact your surgeon's office.       Oral Hygiene is also important to reduce your risk of infection.                                    Remember - BRUSH YOUR TEETH THE MORNING OF SURGERY WITH YOUR REGULAR TOOTHPASTE  DENTURES WILL BE REMOVED PRIOR TO SURGERY PLEASE DO NOT APPLY Poly grip OR ADHESIVES!!!  Do NOT smoke after Midnight   Stop all vitamins and herbal supplements 7 days before surgery.   Take these medicines the morning of surgery with A SIP OF WATER:  inhalers as usual and bring, allopurinol , coreg , allegra, flonase  if needed, gabapentin , vaScepa                Farxiga - hold for 72 hours prior to surgery- last dose on 06/26/2024   DO NOT TAKE ANY ORAL DIABETIC MEDICATIONS DAY OF YOUR SURGERY  Bring CPAP mask and tubing day of surgery.                              You may not have any metal on your body including hair pins, jewelry, and body piercing             Do not wear make-up, lotions, powders, perfumes/cologne, or deodorant  Do not wear nail polish including gel and S&S, artificial/acrylic nails, or any other type of covering on natural nails including finger and toenails. If you have artificial nails, gel coating, etc. that needs to be removed by a nail salon please have this removed prior to surgery or surgery may need to be canceled/ delayed if the surgeon/ anesthesia feels like they are unable to be safely monitored.   Do not shave  48 hours prior to surgery.               Men may shave face and neck.   Do not bring valuables to the hospital. [facility] IS NOT             RESPONSIBLE   FOR VALUABLES.   Contacts, glasses, dentures or bridgework may not be worn into surgery.   Bring small overnight bag day of surgery.   DO NOT BRING YOUR HOME MEDICATIONS TO THE HOSPITAL. PHARMACY WILL DISPENSE MEDICATIONS LISTED ON YOUR MEDICATION LIST TO YOU DURING YOUR ADMISSION IN THE HOSPITAL!    Patients discharged on the day of surgery will not  be allowed to drive home.  Someone NEEDS to stay with you for the first 24 hours after anesthesia.   Special Instructions: Bring a copy of your healthcare power of attorney and living will documents the day of surgery if you haven't scanned them before.              Please read over the following fact sheets you were given: IF YOU HAVE QUESTIONS ABOUT YOUR PRE-OP  INSTRUCTIONS PLEASE CALL [phone].   If you received a COVID test during your pre-op  visit  it is requested that you wear a mask when out in public, stay away from anyone that may not be feeling well and notify your surgeon if you develop symptoms. If you test positive for Covid or have been in contact with anyone that has tested positive in the last 10 days please notify you surgeon.      Pre-operative 5 CHG Bath Instructions   You can play a key role in reducing the risk of infection after surgery. Your skin needs to be as free of germs as possible. You can reduce the number of germs on your skin by washing with CHG (chlorhexidine  gluconate) soap before surgery. CHG is an antiseptic soap that kills germs and continues to kill germs even after washing.   DO NOT use if you have an allergy to chlorhexidine /CHG or antibacterial soaps. If your skin becomes reddened or irritated, stop using the CHG  and notify one of our RNs at [phone].   Please shower with the CHG soap starting 4 days before surgery using the following schedule:     Please keep in mind the following:  DO NOT shave, including legs and underarms, starting the day of your first shower.   You may shave your face at any point before/day of surgery.  Place clean sheets on your bed the day you start using CHG soap. Use a clean washcloth (not used since being washed) for each shower. DO NOT sleep with pets once you start using the CHG.   CHG Shower Instructions:  If you choose to wash your hair and private area, wash first with your normal shampoo/soap.  After you use  shampoo/soap, rinse your hair and body thoroughly to remove shampoo/soap residue.  Turn the water OFF and apply about 3 tablespoons (45 ml) of CHG soap to a CLEAN washcloth.  Apply CHG soap ONLY FROM YOUR NECK DOWN TO YOUR TOES (washing for 3-5 minutes)  DO NOT use CHG soap on face, private areas, open wounds, or sores.  Pay special attention to the area where your surgery is being performed.  If you are having back surgery, having someone wash your back for you may be helpful. Wait 2 minutes after CHG soap is applied, then you may rinse off the CHG soap.  Pat dry with a clean towel  Put on clean clothes/pajamas   If you choose to wear lotion, please use ONLY the CHG-compatible lotions on the back of this paper.     Additional instructions for the day of surgery: DO NOT APPLY any lotions, deodorants, cologne, or perfumes.   Put on clean/comfortable clothes.  Brush your teeth.  Ask your nurse before applying any prescription medications to the skin.      CHG Compatible Lotions   Aveeno Moisturizing lotion  Cetaphil Moisturizing Cream  Cetaphil Moisturizing Lotion  Clairol Herbal Essence Moisturizing Lotion, Dry Skin  Clairol Herbal Essence Moisturizing Lotion, Extra Dry Skin  Clairol Herbal Essence Moisturizing Lotion, Normal Skin  Curel Age Defying Therapeutic Moisturizing Lotion with Alpha Hydroxy  Curel Extreme Care Body Lotion  Curel Soothing Hands Moisturizing Hand Lotion  Curel Therapeutic Moisturizing Cream, Fragrance-Free  Curel Therapeutic Moisturizing Lotion, Fragrance-Free  Curel Therapeutic Moisturizing Lotion, Original Formula  Eucerin Daily Replenishing Lotion  Eucerin Dry Skin Therapy Plus Alpha Hydroxy Crme  Eucerin Dry Skin Therapy Plus Alpha Hydroxy Lotion  Eucerin Original Crme  Eucerin Original Lotion  Eucerin Plus Crme Eucerin Plus Lotion  Eucerin TriLipid Replenishing Lotion  Keri Anti-Bacterial Hand Lotion  Keri Deep Conditioning Original Lotion Dry  Skin Formula Softly Scented  Keri Deep Conditioning Original Lotion, Fragrance Free Sensitive Skin Formula  Keri Lotion Fast Absorbing Fragrance Free Sensitive Skin Formula  Keri Lotion Fast Absorbing Softly Scented Dry Skin Formula  Keri Original Lotion  Keri Skin Renewal Lotion Keri Silky Smooth Lotion  Keri Silky Smooth Sensitive Skin Lotion  Nivea Body Creamy Conditioning Oil  Nivea Body Extra Enriched Teacher, adult education Moisturizing Lotion Nivea Crme  Nivea Skin Firming Lotion  NutraDerm 30 Skin Lotion  NutraDerm Skin Lotion  NutraDerm Therapeutic Skin Cream  NutraDerm Therapeutic Skin Lotion  ProShield Protective Hand Cream  Provon moisturizing lotion

## 2024-06-16 NOTE — Progress Notes (Signed)
 Katherine Larson presents today for injection per the provider's orders.  Retacrit  10,000U administration without incident; injection site WNL; see MAR for injection details.  Patient tolerated procedure well and without incident.  No questions or complaints noted at this time. Patient's hemoglobin noted to be 9.8 today.  Discharged from clinic via wheelchair in stable condition. Alert and oriented x 3. F/U with [facility] as scheduled.

## 2024-06-18 ENCOUNTER — Encounter: Payer: Self-pay | Admitting: Internal Medicine

## 2024-06-18 ENCOUNTER — Encounter (HOSPITAL_COMMUNITY): Payer: Self-pay

## 2024-06-18 ENCOUNTER — Encounter (HOSPITAL_COMMUNITY)
Admission: RE | Admit: 2024-06-18 | Discharge: 2024-06-18 | Disposition: A | Discharge status: Home / Self Care | Source: Ambulatory Visit | Attending: Orthopedic Surgery | Admitting: Orthopedic Surgery

## 2024-06-18 ENCOUNTER — Other Ambulatory Visit: Payer: Self-pay

## 2024-06-18 VITALS — BP 131/60 | HR 73 | Temp 98.7°F | Resp 16 | Ht 62.5 in | Wt 128.3 lb

## 2024-06-18 DIAGNOSIS — Z951 Presence of aortocoronary bypass graft: Secondary | ICD-10-CM | POA: Insufficient documentation

## 2024-06-18 DIAGNOSIS — I447 Left bundle-branch block, unspecified: Secondary | ICD-10-CM | POA: Insufficient documentation

## 2024-06-18 DIAGNOSIS — Z8616 Personal history of COVID-19: Secondary | ICD-10-CM | POA: Insufficient documentation

## 2024-06-18 DIAGNOSIS — I2510 Atherosclerotic heart disease of native coronary artery without angina pectoris: Secondary | ICD-10-CM | POA: Insufficient documentation

## 2024-06-18 DIAGNOSIS — J849 Interstitial pulmonary disease, unspecified: Secondary | ICD-10-CM | POA: Insufficient documentation

## 2024-06-18 DIAGNOSIS — I130 Hypertensive heart and chronic kidney disease with heart failure and stage 1 through stage 4 chronic kidney disease, or unspecified chronic kidney disease: Secondary | ICD-10-CM | POA: Insufficient documentation

## 2024-06-18 DIAGNOSIS — Z95810 Presence of automatic (implantable) cardiac defibrillator: Secondary | ICD-10-CM | POA: Diagnosis not present

## 2024-06-18 DIAGNOSIS — M1711 Unilateral primary osteoarthritis, right knee: Secondary | ICD-10-CM | POA: Insufficient documentation

## 2024-06-18 DIAGNOSIS — I5022 Chronic systolic (congestive) heart failure: Secondary | ICD-10-CM | POA: Diagnosis not present

## 2024-06-18 DIAGNOSIS — Z9981 Dependence on supplemental oxygen: Secondary | ICD-10-CM | POA: Diagnosis not present

## 2024-06-18 DIAGNOSIS — D649 Anemia, unspecified: Secondary | ICD-10-CM | POA: Insufficient documentation

## 2024-06-18 DIAGNOSIS — Z01812 Encounter for preprocedural laboratory examination: Secondary | ICD-10-CM | POA: Diagnosis not present

## 2024-06-18 DIAGNOSIS — E1122 Type 2 diabetes mellitus with diabetic chronic kidney disease: Secondary | ICD-10-CM | POA: Insufficient documentation

## 2024-06-18 DIAGNOSIS — Z01818 Encounter for other preprocedural examination: Secondary | ICD-10-CM

## 2024-06-18 DIAGNOSIS — N189 Chronic kidney disease, unspecified: Secondary | ICD-10-CM | POA: Insufficient documentation

## 2024-06-18 DIAGNOSIS — Z87891 Personal history of nicotine dependence: Secondary | ICD-10-CM | POA: Diagnosis not present

## 2024-06-18 HISTORY — DX: Presence of automatic (implantable) cardiac defibrillator: Z95.810

## 2024-06-18 LAB — CBC
HCT: 33.1 % — ABNORMAL LOW (ref 36.0–46.0)
Hemoglobin: 10 g/dL — ABNORMAL LOW (ref 12.0–15.0)
MCH: 31.7 pg (ref 26.0–34.0)
MCHC: 30.2 g/dL (ref 30.0–36.0)
MCV: 105.1 fL — ABNORMAL HIGH (ref 80.0–100.0)
Platelets: 152 K/uL (ref 150–400)
RBC: 3.15 MIL/uL — ABNORMAL LOW (ref 3.87–5.11)
RDW: 15.2 % (ref 11.5–15.5)
WBC: 7 K/uL (ref 4.0–10.5)
nRBC: 0 % (ref 0.0–0.2)

## 2024-06-18 LAB — BASIC METABOLIC PANEL WITH GFR
Anion gap: 10 (ref 5–15)
BUN: 25 mg/dL — ABNORMAL HIGH (ref 8–23)
CO2: 28 mmol/L (ref 22–32)
Calcium: 9.6 mg/dL (ref 8.9–10.3)
Chloride: 102 mmol/L (ref 98–111)
Creatinine, Ser: 0.92 mg/dL (ref 0.44–1.00)
GFR, Estimated: >60 mL/min (ref >60)
Glucose, Bld: 123 mg/dL — ABNORMAL HIGH (ref 70–99)
Potassium: 4.3 mmol/L (ref 3.5–5.1)
Sodium: 140 mmol/L (ref 135–145)

## 2024-06-18 LAB — HEMOGLOBIN A1C
Hgb A1c MFr Bld: 6.4 % — ABNORMAL HIGH (ref 4.8–5.6)
Mean Plasma Glucose: 136.98 mg/dL

## 2024-06-18 LAB — SURGICAL PCR SCREEN
MRSA, PCR: NEGATIVE
Staphylococcus aureus: NEGATIVE

## 2024-06-18 LAB — GLUCOSE, CAPILLARY: Glucose-Capillary: 125 mg/dL — ABNORMAL HIGH (ref 70–99)

## 2024-06-18 NOTE — Progress Notes (Signed)
 PERIOPERATIVE PRESCRIPTION FOR IMPLANTED CARDIAC DEVICE PROGRAMMING  Patient Information: Name:  Katherine Larson  DOB:  [birth year]  MRN:  [id number]    Planned Procedure:  right total knee arthroplasty   Surgeon:  Dr  Norleen Gavel  Date of Procedure:  06/30/2024  Cautery will be used.  Position during surgery:   Please send documentation back to:  Darryle Law (Fax # [phone])  Device Information:  Clinic EP Physician:  Danelle Birmingham, MD   Device Type:  Defibrillator Manufacturer and Phone #:  [employer]: [phone] Pacemaker Dependent?:  No. Date of Last Device Check:  06/12/24  Normal Device Function?:  Yes.    Electrophysiologist's Recommendations:  Have magnet available. Provide continuous ECG monitoring when magnet is used or reprogramming is to be performed.  Procedure should not interfere with device function.  No device programming or magnet placement needed.  Per Device Clinic Standing Orders, [name], RN  1:06 PM 06/18/2024

## 2024-06-18 NOTE — Care Plan (Signed)
 Ortho Bundle Case Management Note  Patient Details  Name: Katherine Larson MRN: [id number] Date of Birth: 27-May-1946    met with patient and husband in the office for H&P. will discharge to home with family assistance. has DME at home. HHPT referral to [facility]. OPPT set up with [facility] OPP[facility]. discharge instructions discussed and questions answered. of note - patient presented in a WC. it is very painful to ambulate at this time. she is also legally blind. Patient and MD in agreement with plan. Choice offered.                  DME Arranged:    DME Agency:     HH Arranged:  PT HH Agency:  [facility]  Additional Comments: Please contact me with any questions of if this plan should need to change.  Charlies Pitch,  RN,BSN,MHA,CCM  [facility] Orthopaedic Specialist  [phone] 06/18/2024, 3:32 PM

## 2024-06-19 NOTE — Progress Notes (Signed)
 Remote ICD transmission.

## 2024-06-19 NOTE — Progress Notes (Signed)
 Anesthesia Chart Review   Case: [id number] Date/Time: 06/30/24 0942   Procedure: ARTHROPLASTY, KNEE, TOTAL (Right: Knee) - RIGHT TOTAL KNEE ARTHROPLASTY   Anesthesia type: Spinal   Pre-op  diagnosis: RIGHT KNEE OSTEOARTHRITIS   Location: [facility]OM 06 / [facility]   Surgeons: Yvone Rush, MD       DISCUSSION:78 y.o. former smoker with h/o PONV, HTN, ILD uses O2 at bedtime, CAD s/p CABG, NICM, AICD in place (device orders in 06/18/2024 progress note), LBBB, DM II (A1C 6.4), CKD, right knee OA scheduled for above procedure 06/30/24 with Dr. Rush Yvone.   AICD placed 05/14/2024.   Pt with h/o normocytic anemia, follows with hem/onc.  Last seen 06/16/2024. Receives monthly Retacrit .   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 Pt last seen 03/28/2024. Per OV note, Respiratory complications generally occur in 1% of ASA Class I patients, 5% of ASA Class II and 10% of ASA Class III-IV patients These complications rarely result in mortality and iclude postoperative pneumonia, atelectasis, pulmonary embolism, ARDS and increased time requiring postoperative mechanical ventilation.   Per ARISCAT criteria she is at low risk with estimated 1.6% pulmonary complication rate  Overall, I recommend proceeding with the surgery if the risk for respiratory complications are outweighed by the potential benefits. This will need to be discussed between the patient and surgeon.   To reduce risks of respiratory complications, I recommend: --Pre- and post-operative incentive spirometry performed frequently while awake --Avoiding use of pancuronium during anesthesia.   I have discussed the risk factors and recommendations above with the patient.      Plan/Recommendations: Continue supplemental oxygen  at night Cleared for orthopedic knee surgery VS: BP 131/60   Pulse 73   Temp 37.1 C (Oral)   Resp 16   Ht 5' 2.5 (1.588 m)   Wt 58.2 kg   SpO2 99%   BMI 23.09 kg/m   PROVIDERS: Teresa Channel,  MD is PCP    LABS: Labs reviewed: Acceptable for surgery. (all labs ordered are listed, but only abnormal results are displayed)  Labs Reviewed  CBC - Abnormal; Notable for the following components:      Result Value   RBC 3.15 (*)    Hemoglobin 10.0 (*)    HCT 33.1 (*)    MCV 105.1 (*)    All other components within normal limits  HEMOGLOBIN A1C - Abnormal; Notable for the following components:   Hgb A1c MFr Bld 6.4 (*)    All other components within normal limits  BASIC METABOLIC PANEL WITH GFR - Abnormal; Notable for the following components:   Glucose, Bld 123 (*)    BUN 25 (*)    All other components within normal limits  GLUCOSE, CAPILLARY - Abnormal; Notable for the following components:   Glucose-Capillary 125 (*)    All other components within normal limits  SURGICAL PCR SCREEN     IMAGES:   EKG:   CV: Echo 02/19/24  1. Left ventricular ejection fraction, by estimation, is 35 to 40%. Left  ventricular ejection fraction by 3D volume is 35 %. The left ventricle has  moderately decreased function. The left ventricle demonstrates regional  wall motion abnormalities (see  scoring diagram/findings for description). Left ventricular diastolic  parameters are indeterminate. The average left ventricular global  longitudinal strain is -7.5 %. The global longitudinal strain is abnormal.   2. Right ventricular systolic function is normal. The right ventricular  size is normal. Tricuspid regurgitation signal is inadequate for assessing  PA pressure.   3. The mitral valve is normal in structure. Trivial mitral valve  regurgitation. No evidence of mitral stenosis.   4. The aortic valve is normal in structure. Aortic valve regurgitation is  not visualized. No aortic stenosis is present.   5. The inferior vena cava is normal in size with greater than 50%  respiratory variability, suggesting right atrial pressure of 3 mmHg.   Past Medical History:  Diagnosis Date   AICD  (automatic cardioverter/defibrillator) present    Anemia    anemia of chronic disease +/- IDA followed by Dr. Gatha. received iron  infusions in the past   Borderline glaucoma    CAD (coronary artery disease)    a. s/p CABG 1996. b. low risk nuc 2011. c. LHC 04/2016 due to drop in EF -> occluded native LAD,  widely patent sequential LIMA-D1-LAD.   Chronic kidney disease    mild stage of kidney disease   Chronic systolic CHF (congestive heart failure) (HCC)    Complication of anesthesia    DM2 (diabetes mellitus, type 2) (HCC)    not on any medicine for this at this time, 05/2016   Dyspnea    with exertion   GERD (gastroesophageal reflux disease)    Gout    History of blood transfusion    History of hiatal hernia    in 20s   History of pneumonia    March, 2016   HLD (hyperlipidemia)    HTN (hypertension)    Inappropriate sinus tachycardia (HCC)    LBBB (left bundle branch block)    a. Seen in 04/2016   Lymphocytic colitis 04/2020   Macular degeneration    left   Myocardial infarction [facility])    1996   Neuropathy    Neuropathy    NICM (nonischemic cardiomyopathy) (HCC)    a. Dx 04/2016 - EF 25-30%, diffuse HK, elevated LVEDP, mild MR, mod LAE.   Normocytic anemia 10/17/2021   NSVT (nonsustained ventricular tachycardia) (HCC)    PAT (paroxysmal atrial tachycardia) (HCC)    Pneumonia    PONV (postoperative nausea and vomiting)    after just about every surgery I've had   Rheumatoid arthritis (HCC)    RA- hands   Seasonal allergies     Past Surgical History:  Procedure Laterality Date   ABDOMINAL HYSTERECTOMY     APPENDECTOMY     BACK SURGERY     BIOPSY  04/27/2020   Procedure: BIOPSY;  Surgeon: Shaaron Lamar HERO, MD;  [facility];  Service: Endoscopy;;  ascending colon biopsy   BIV UPGRADE N/A 05/14/2024   Procedure: BIV UPGRADE;  Surgeon: Waddell Danelle ORN, MD;  Location: [facility] INVASIVE CV LAB;  Service: Cardiovascular;  Laterality: N/A;   CARDIAC  CATHETERIZATION N/A 05/01/2016   Procedure: Right/Left Heart Cath and Coronary/Graft Angiography;  Surgeon: Alm ORN Clay, MD;  Location: [facility] INVASIVE CV LAB;  Service: Cardiovascular;  Laterality: N/A;   CHOLECYSTECTOMY     COLONOSCOPY  11/2011   Dr. Magod:ext/int hemorrhoids, diverticulosis sigmoid colon and distal desc colon, mid-desc colon hyperplastic polyps.    COLONOSCOPY WITH PROPOFOL  N/A 04/27/2020   Rourk: Diverticulosis, hemorrhoids, normal terminal ileum.  Random colon biopsies significant for chronic lymphocytic colitis. No repeat due to age.   CORONARY ARTERY BYPASS GRAFT  1996   2 vessels   ESOPHAGOGASTRODUODENOSCOPY  11/2011   Dr. Rosalie: small hiatal hernia, one non-bleeding superficial gastric ulcer, medium-sized diverticulum in area of papilla   ESOPHAGOGASTRODUODENOSCOPY N/A 12/29/2015  Dr. Shaaron: Chronic inactive gastritis, normal esophagus status post dilation, duodenal diverticula   ESOPHAGOGASTRODUODENOSCOPY (EGD) WITH PROPOFOL  N/A 07/24/2019   Dr. Shaaron: Normal esophagus status post empiric dilation, small hiatal hernia, large D2 diverticulum   ESOPHAGOGASTRODUODENOSCOPY (EGD) WITH PROPOFOL  N/A 06/03/2021   Surgeon: Shaaron Lamar HERO, MD; normal esophagus, normal stomach, normal duodenum.  Abnormal hypopharyngeal mucosa bilaterally just distal to the base of the tongue (right side greater than left), overlying mucosa appeared irregular and somewhat verrucous in appearance.  Recommended ENT evaluation.   EYE SURGERY Bilateral    cataract removal   FOOT SURGERY     removal bone spur   FRACTURE SURGERY Right    arm   GIVENS CAPSULE STUDY  11/2012   Dr. Rosalie: minimal gastritis, normal small bowel capsule   HERNIA REPAIR     hiatal hernia   ICD IMPLANT N/A 02/04/2021   Procedure: ICD IMPLANT;  Surgeon: Waddell Danelle ORN, MD;  Location: [facility] INVASIVE CV LAB;  Service: Cardiovascular;  Laterality: N/A;   MALONEY DILATION N/A 07/24/2019   Procedure: AGAPITO DILATION;   Surgeon: Shaaron Lamar HERO, MD;  [facility];  Service: Endoscopy;  Laterality: N/A;   MALONEY DILATION N/A 06/03/2021   Procedure: AGAPITO DILATION;  Surgeon: Shaaron Lamar HERO, MD;  [facility];  Service: Endoscopy;  Laterality: N/A;   MAXIMUM ACCESS (MAS)POSTERIOR LUMBAR INTERBODY FUSION (PLIF) 1 LEVEL N/A 08/14/2013   Procedure:  MAXIMUM ACCESS SURGERY(MAS) POSTERIOR LUMBAR INTERBODY FUSION LUMBAR THREE-FOUR ;  Surgeon: Alm GORMAN Molt, MD;  Location: [facility];  Service: Neurosurgery;  Laterality: N/A;   MAXIMUM ACCESS SURGERY(MAS) POSTERIOR LUMBAR INTERBODY FUSION LUMBAR THREE-FOUR    PTCA     RIGHT/LEFT HEART CATH AND CORONARY ANGIOGRAPHY N/A 03/26/2020   Procedure: RIGHT/LEFT HEART CATH AND CORONARY ANGIOGRAPHY;  Surgeon: Anner Alm ORN, MD;  Location: [facility] INVASIVE CV LAB;  Service: Cardiovascular;  Laterality: N/A;   SPINAL CORD STIMULATOR BATTERY EXCHANGE N/A 01/19/2021   Procedure: Spinal cord stimulator battery change;  Surgeon: Darlis Deatrice GORMAN, MD;  Location: [facility] OR;  Service: Neurosurgery;  Laterality: N/A;   SPINAL CORD STIMULATOR INSERTION N/A 06/16/2016   Procedure: LUMBAR SPINAL CORD STIMULATOR INSERTION;  Surgeon: Deward Fabian, MD;  [facility];  Service: Neurosurgery;  Laterality: N/A;  LUMBAR SPINAL CORD STIMULATOR INSERTION   TEE WITHOUT CARDIOVERSION N/A 02/01/2023   Procedure: TRANSESOPHAGEAL ECHOCARDIOGRAM (TEE);  Surgeon: Debera Jayson MATSU, MD;  Location: [facility];  Service: Cardiovascular;  Laterality: N/A;   tens  05/2016   TONSILLECTOMY      MEDICATIONS:  albuterol  (VENTOLIN  HFA) 108 (90 Base) MCG/ACT inhaler   allopurinol  (ZYLOPRIM ) 300 MG tablet   aspirin  EC 81 MG EC tablet   atorvastatin  (LIPITOR ) 80 MG tablet   carvedilol  (COREG ) 6.25 MG tablet   cholecalciferol (VITAMIN D3) 25 MCG (1000 UT) tablet   etanercept (ENBREL SURECLICK) 50 MG/ML injection   Evolocumab  (REPATHA  SURECLICK) 140 MG/ML SOAJ   FARXIGA  10 MG TABS tablet    ferrous sulfate 325 (65 FE) MG tablet   fexofenadine (ALLEGRA) 180 MG tablet   fluticasone  (FLONASE ) 50 MCG/ACT nasal spray   furosemide  (LASIX ) 40 MG tablet   gabapentin  (NEURONTIN ) 300 MG capsule   lansoprazole  (PREVACID ) 30 MG capsule   leflunomide  (ARAVA ) 10 MG tablet   losartan  (COZAAR ) 100 MG tablet   MAGNESIUM -OXIDE 400 (240 Mg) MG tablet   meclizine (ANTIVERT) 12.5 MG tablet   montelukast  (SINGULAIR ) 10 MG tablet   Multiple Vitamins-Minerals (  PRESERVISION AREDS 2) CAPS   nitroGLYCERIN  (NITROSTAT ) 0.4 MG SL tablet   oxyCODONE -acetaminophen  (PERCOCET) 10-325 MG tablet   sacubitril -valsartan  (ENTRESTO ) 24-26 MG   spironolactone  (ALDACTONE ) 25 MG tablet   TRELEGY ELLIPTA 200-62.5-25 MCG/ACT AEPB   VASCEPA  1 g capsule   vitamin B-12 (CYANOCOBALAMIN ) 250 MCG tablet   No current facility-administered medications for this encounter.     Harlene Hoots Ward, PA-C [facility]Surgical Testing [phone]

## 2024-06-20 DIAGNOSIS — M961 Postlaminectomy syndrome, not elsewhere classified: Secondary | ICD-10-CM | POA: Diagnosis not present

## 2024-06-20 DIAGNOSIS — M47812 Spondylosis without myelopathy or radiculopathy, cervical region: Secondary | ICD-10-CM | POA: Diagnosis not present

## 2024-06-20 DIAGNOSIS — Z9689 Presence of other specified functional implants: Secondary | ICD-10-CM | POA: Diagnosis not present

## 2024-06-20 NOTE — Anesthesia Preprocedure Evaluation (Addendum)
 Anesthesia Evaluation  Patient identified by MRN, date of birth, ID band Patient awake    Reviewed: Allergy & Precautions, H&P , NPO status , Patient's Chart, lab work & pertinent test results, reviewed documented beta blocker date and time   History of Anesthesia Complications (+) PONV and history of anesthetic complications  Airway Mallampati: III  TM Distance: >3 FB Neck ROM: Full    Dental no notable dental hx. (+) Edentulous Upper, Edentulous Lower, Dental Advisory Given   Pulmonary COPD, former smoker   Pulmonary exam normal breath sounds clear to auscultation       Cardiovascular hypertension, Pt. on medications and Pt. on home beta blockers + CAD, + Past MI, + CABG and +CHF  + dysrhythmias + Cardiac Defibrillator  Rhythm:Regular Rate:Normal     Neuro/Psych negative neurological ROS  negative psych ROS   GI/Hepatic Neg liver ROS, hiatal hernia,GERD  Medicated,,  Endo/Other  diabetes, Type 2, Oral Hypoglycemic Agents Hyperthyroidism   Renal/GU negative Renal ROS  negative genitourinary   Musculoskeletal  (+) Arthritis , Osteoarthritis,    Abdominal   Peds  Hematology  (+) Blood dyscrasia, anemia   Anesthesia Other Findings   Reproductive/Obstetrics negative OB ROS                              Anesthesia Physical Anesthesia Plan  ASA: 3  Anesthesia Plan: Spinal   Post-op Pain Management: Regional block* and Tylenol  PO (pre-op )*   Induction: Intravenous  PONV Risk Score and Plan: 4 or greater and Ondansetron , Dexamethasone  and Propofol  infusion  Airway Management Planned: Natural Airway and Simple Face Mask  Additional Equipment:   Intra-op Plan:   Post-operative Plan:   Informed Consent: I have reviewed the patients History and Physical, chart, labs and discussed the procedure including the risks, benefits and alternatives for the proposed anesthesia with the patient  or authorized representative who has indicated his/her understanding and acceptance.     Dental advisory given  Plan Discussed with: CRNA  Anesthesia Plan Comments: (See PAT note 06/18/2024)         Anesthesia Quick Evaluation

## 2024-06-23 ENCOUNTER — Ambulatory Visit (INDEPENDENT_AMBULATORY_CARE_PROVIDER_SITE_OTHER)

## 2024-06-23 ENCOUNTER — Other Ambulatory Visit: Payer: Self-pay

## 2024-06-23 ENCOUNTER — Other Ambulatory Visit (HOSPITAL_COMMUNITY): Payer: Self-pay

## 2024-06-23 DIAGNOSIS — Z95810 Presence of automatic (implantable) cardiac defibrillator: Secondary | ICD-10-CM

## 2024-06-23 DIAGNOSIS — M25561 Pain in right knee: Secondary | ICD-10-CM | POA: Insufficient documentation

## 2024-06-23 DIAGNOSIS — I5022 Chronic systolic (congestive) heart failure: Secondary | ICD-10-CM

## 2024-06-23 DIAGNOSIS — Z7409 Other reduced mobility: Secondary | ICD-10-CM | POA: Insufficient documentation

## 2024-06-24 ENCOUNTER — Encounter: Payer: Self-pay | Admitting: Internal Medicine

## 2024-06-24 ENCOUNTER — Telehealth: Payer: Self-pay | Admitting: Cardiovascular Disease

## 2024-06-24 NOTE — Progress Notes (Signed)
 PERIOPERATIVE PRESCRIPTION FOR IMPLANTED CARDIAC DEVICE PROGRAMMING   Patient Information:  Patient: Katherine Larson  MRN: [id number]  Date of Birth: 03-Apr-1946   Procedure:  Right Total Knee Replacement   Date of Surgery:  Clearance 06/30/24                                  Surgeon:  Dr. Norleen Gavel Surgeon's Group or Practice Name:  [facility] Phone number:  [phone]  Fax number:  [phone]    Device Information:   Clinic EP Physician:   Dr. Danelle Birmingham Device Type:  Defibrillator Manufacturer and Phone #:  [employer]: [phone] Pacemaker Dependent?:  No Date of Last Device Check:  06/23/2024         Normal Device Function?:  Yes     Electrophysiologist's Recommendations:   Have magnet available. Provide continuous ECG monitoring when magnet is used or reprogramming is to be performed.  Procedure should not interfere with device function.  No device programming or magnet placement needed.  Per Device Clinic Standing Orders, [name]  06/24/2024 10:45 AM

## 2024-06-24 NOTE — Progress Notes (Signed)
 EPIC Encounter for ICM Monitoring  Patient Name: Katherine Larson is a 78 y.o. female Date: 06/24/2024 Primary Care Physican: Teresa Channel, MD Primary Cardiologist: Rolan Electrophysiologist: Waddell 01/25/2024 Office Weight: 126 lbs     04/17/2024 Office Weight: 128 lbs    06/24/2024 Weight: 128 lbs (125 lbs baseline)                                                Spoke with patient and heart failure questions reviewed.  Transmission results reviewed.  Pt reports feeling bloated and weight gain of 3 lbs that did not resolve after taking 40 mg lasix  x 2 days but during that time she may have been eating foods high in salt.  Scheduled for Total Knee Replacement 8/11.       CorVue thoracic impedance suggesting possible fluid accumulation starting 7/26 and returned close to baseline 8/3.   Prescribed:  Furosemide  40 mg take 0.5 tablet(s) (20 mg total) by mouth daily.   Labs: 06/18/2024 Creatinine 0.92, BUN 25, Potassium 4.3, Sodium 140, GFR >60 06/16/2024 Creatinine 0.99, BUN 27, Potassium 4.2, Sodium 136, GFR 59 04/25/2024 Creatinine 1.18, BUN 27, Potassium 4.7, Sodium 140, GFR 48  04/17/2024 Creatinine 1.25, BUN 32, Potassium 5.5, Sodium 136, GFR 45  03/28/2024 Creatinine 1.11, BUN 28, Potassium 5.3, Sodium 141, GFR 52  03/17/2024 Creatinine 1.39, BUN 34, Potassium 4.6, Sodium 140 12/27/2023 Creatinine 1.04, BUN 32, Potassium 4.4, Sodium 137, GFR 56  11/22/2023 Creatinine 1.17, BUN 33, Potassium 4.5, Sodium 135, GFR 48  A complete set of results can be found in Results Review.   Recommendations:  Advised to take Furosemide  40 mg x 2 days then return to 20 mg daily.  Also advised to limit salt intake while taking extra Lasix .    Follow-up plan: ICM clinic phone appointment on 07/07/2024 to check fluid levels after Total knee surgery.   91 day device clinic remote transmission 08/01/2024.     EP/Cardiology Office Visits: 07/08/2024 with Dr Rolan.  08/14/2024 with Daphne Barrack, NP.   Copy of  ICM check sent to Dr. Waddell.   3 month ICM trend: 06/23/2024.    12-14 Month ICM trend:     Mitzie GORMAN Garner, RN 06/24/2024 3:42 PM

## 2024-06-24 NOTE — Telephone Encounter (Signed)
   Name: Katherine Larson  DOB: 10-Aug-1946  MRN: [id number]  Primary Cardiologist: Maude Emmer, MD  Chart reviewed as part of pre-operative protocol coverage. Because of EMSLEE LOPEZMARTINEZ past medical history and time since last visit, she will require a follow-up in-office visit with advanced HF clinic in order to better assess preoperative cardiovascular risk. Per prior notes patient has significantly decreased mobility as such should be seen in clinic, she is also due for advanced heart failure clinic follow up. Per Dr. Orvilla prior notes would also consider pulmonary evaluation prior to surgery, please ensure requesting office is aware.   Pre-op  covering staff: - Please schedule appointment and call patient to inform them. If patient already had an upcoming appointment within acceptable timeframe, please add pre-op  clearance to the appointment notes so provider is aware. - Please contact requesting surgeon's office via preferred method (i.e, phone, fax) to inform them of need for appointment prior to surgery.  [name] [name], NP  06/24/2024, 9:05 AM

## 2024-06-24 NOTE — Telephone Encounter (Signed)
 Pt has appt with HF clinic 07/08/24 with Dr Rolan  Will route to surgeons office.

## 2024-06-24 NOTE — Telephone Encounter (Signed)
 Sent message to the Heart Failure Clinic about pt needing to be scheduled with them per Katlyn West, NP note.  (Katlyn West, NP note: Because of EVITA MERIDA past medical history and time since last visit, she will require a follow-up in-office visit with advanced HF clinic in order to better assess preoperative cardiovascular risk. Per prior notes patient has significantly decreased mobility as such should be seen in clinic, she is also due for advanced heart failure clinic follow up. Per Dr. Orvilla prior notes would also consider pulmonary evaluation prior to surgery, please ensure requesting office is aware. )

## 2024-06-24 NOTE — Telephone Encounter (Signed)
   Pre-operative Risk Assessment    Patient Name: Katherine Larson  DOB: 1946-05-13 MRN: [id number]   Date of last office visit: 04/09/24 Date of next office visit: 08/14/24   Request for Surgical Clearance    Procedure:  Right Total Knee Replacement  Date of Surgery:  Clearance 06/30/24                                Surgeon:  Dr. Norleen Gavel Surgeon's Group or Practice Name:  [facility] Phone number:  [phone]  Fax number:  [phone]   Type of Clearance Requested:   - Medical  - Pharmacy:  Hold        Type of Anesthesia:  Spinal   Additional requests/questions:  Caller Mateo) stated patient will need URGENT clearance.  Signed, Jasmin B Wilson   06/24/2024, 8:29 AM

## 2024-06-27 DIAGNOSIS — M1711 Unilateral primary osteoarthritis, right knee: Secondary | ICD-10-CM | POA: Diagnosis present

## 2024-06-27 NOTE — H&P (Signed)
 TOTAL KNEE ADMISSION H&P  Patient is being admitted for right total knee arthroplasty.  Subjective:  Chief Complaint:right knee pain.  HPI: Katherine Larson, 78 y.o. female, has a history of pain and functional disability in the right knee due to arthritis and has failed non-surgical conservative treatments for greater than 12 weeks to includecorticosteriod injections, flexibility and strengthening excercises, supervised PT with diminished ADL's post treatment, weight reduction as appropriate, and activity modification.  Onset of symptoms was gradual, starting 3 years ago with gradually worsening course since that time. The patient noted no past surgery on the right knee(s).  Patient currently rates pain in the right knee(s) at 8 out of 10 with activity. Patient has night pain, pain that interferes with activities of daily living, pain with passive range of motion, crepitus, and joint swelling.  Patient has evidence of subchondral cysts, periarticular osteophytes, and joint space narrowing by imaging studies.  There is no active infection.  Patient Active Problem List   Diagnosis Date Noted   Primary osteoarthritis of right knee 06/27/2024   Bacteremia 02/01/2023   Pseudomonas sepsis (HCC) 01/30/2023   Acute respiratory failure with hypoxia (HCC) 01/28/2023   Multifocal pneumonia 01/28/2023   Melena 11/28/2022   Rectal bleeding 11/28/2022   Upper abdominal pain 11/28/2022   Early satiety 07/27/2022   Nausea without vomiting 07/27/2022   Hypotension 02/10/2022   Normocytic anemia 10/17/2021   Lymphocytic colitis 07/02/2020   Coronary artery disease, occlusive: CTO proxLAD 03/26/2020   Abnormal nuclear stress test 03/26/2020   Chronic diarrhea 03/02/2020   NICM- new drop in EF 25-30% 05/03/2016   Acute pulmonary edema (HCC)    LBBB (left bundle branch block) 04/29/2016   Chronic HFrEF (heart failure with reduced ejection fraction) (HCC) 04/29/2016   Abdominal pain, chronic, epigastric  03/07/2016   Septic shock (HCC)    COPD exacerbation (HCC) 01/21/2016   Hyperthyroidism 01/21/2016   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   Hypokalemia 01/19/2016   Hyponatremia 01/19/2016   Diet-controlled diabetes mellitus (HCC) 01/19/2016   CAP (community acquired pneumonia) 01/18/2016   Mucosal abnormality of stomach    Dysphagia    Constipation 12/08/2015   Gastroesophageal reflux disease 12/08/2015   Abnormal CT scan, esophagus 12/08/2015   Esophageal dysphagia 12/08/2015   Abdominal pain, epigastric 12/08/2015   Loss of weight 12/08/2015   Lower abdominal pain 12/08/2015   Back pain 08/26/2015   Headache 08/26/2015   Essential hypertension 08/26/2015   DM type 2 (diabetes mellitus, type 2) (HCC) 08/26/2015   Sinus tachycardia 06/07/2015   Diarrhea 06/07/2015   Preop cardiovascular exam 07/16/2013   Atrial flutter (HCC) 07/16/2013   Anemia 01/08/2013   Leukocytosis 12/17/2012   DYSPNEA 02/02/2010   DIABETES MELLITUS 07/28/2009   Hyperlipidemia 07/28/2009   CHOLECYSTECTOMY, HX OF 07/28/2009   Hx of CABG '96- cath 05/01/16 07/28/2009   HERNIORRHAPHY, HX OF 07/28/2009   Past Medical History:  Diagnosis Date   AICD (automatic cardioverter/defibrillator) present    Anemia    anemia of chronic disease +/- IDA followed by Dr. Gatha. received iron  infusions in the past   Borderline glaucoma    CAD (coronary artery disease)    a. s/p CABG 1996. b. low risk nuc 2011. c. LHC 04/2016 due to drop in EF -> occluded native LAD,  widely patent sequential LIMA-D1-LAD.   Chronic kidney disease    mild stage of kidney disease   Chronic systolic CHF (congestive heart failure) (HCC)    Complication of anesthesia  DM2 (diabetes mellitus, type 2) (HCC)    not on any medicine for this at this time, 05/2016   Dyspnea    with exertion   GERD (gastroesophageal reflux disease)    Gout    History of blood transfusion    History of hiatal hernia    in 20s   History of pneumonia     March, 2016   HLD (hyperlipidemia)    HTN (hypertension)    Inappropriate sinus tachycardia (HCC)    LBBB (left bundle branch block)    a. Seen in 04/2016   Lymphocytic colitis 04/2020   Macular degeneration    left   Myocardial infarction [facility])    1996   Neuropathy    Neuropathy    NICM (nonischemic cardiomyopathy) (HCC)    a. Dx 04/2016 - EF 25-30%, diffuse HK, elevated LVEDP, mild MR, mod LAE.   Normocytic anemia 10/17/2021   NSVT (nonsustained ventricular tachycardia) (HCC)    PAT (paroxysmal atrial tachycardia) (HCC)    Pneumonia    PONV (postoperative nausea and vomiting)    after just about every surgery I've had   Rheumatoid arthritis (HCC)    RA- hands   Seasonal allergies     Past Surgical History:  Procedure Laterality Date   ABDOMINAL HYSTERECTOMY     APPENDECTOMY     BACK SURGERY     BIOPSY  04/27/2020   Procedure: BIOPSY;  Surgeon: Shaaron Lamar HERO, MD;  [facility];  Service: Endoscopy;;  ascending colon biopsy   BIV UPGRADE N/A 05/14/2024   Procedure: BIV UPGRADE;  Surgeon: Waddell Danelle ORN, MD;  Location: [facility] INVASIVE CV LAB;  Service: Cardiovascular;  Laterality: N/A;   CARDIAC CATHETERIZATION N/A 05/01/2016   Procedure: Right/Left Heart Cath and Coronary/Graft Angiography;  Surgeon: Alm ORN Clay, MD;  Location: [facility] INVASIVE CV LAB;  Service: Cardiovascular;  Laterality: N/A;   CHOLECYSTECTOMY     COLONOSCOPY  11/2011   Dr. Magod:ext/int hemorrhoids, diverticulosis sigmoid colon and distal desc colon, mid-desc colon hyperplastic polyps.    COLONOSCOPY WITH PROPOFOL  N/A 04/27/2020   Rourk: Diverticulosis, hemorrhoids, normal terminal ileum.  Random colon biopsies significant for chronic lymphocytic colitis. No repeat due to age.   CORONARY ARTERY BYPASS GRAFT  1996   2 vessels   ESOPHAGOGASTRODUODENOSCOPY  11/2011   Dr. Rosalie: small hiatal hernia, one non-bleeding superficial gastric ulcer, medium-sized diverticulum in area of papilla    ESOPHAGOGASTRODUODENOSCOPY N/A 12/29/2015   Dr. Shaaron: Chronic inactive gastritis, normal esophagus status post dilation, duodenal diverticula   ESOPHAGOGASTRODUODENOSCOPY (EGD) WITH PROPOFOL  N/A 07/24/2019   Dr. Shaaron: Normal esophagus status post empiric dilation, small hiatal hernia, large D2 diverticulum   ESOPHAGOGASTRODUODENOSCOPY (EGD) WITH PROPOFOL  N/A 06/03/2021   Surgeon: Shaaron Lamar HERO, MD; normal esophagus, normal stomach, normal duodenum.  Abnormal hypopharyngeal mucosa bilaterally just distal to the base of the tongue (right side greater than left), overlying mucosa appeared irregular and somewhat verrucous in appearance.  Recommended ENT evaluation.   EYE SURGERY Bilateral    cataract removal   FOOT SURGERY     removal bone spur   FRACTURE SURGERY Right    arm   GIVENS CAPSULE STUDY  11/2012   Dr. Rosalie: minimal gastritis, normal small bowel capsule   HERNIA REPAIR     hiatal hernia   ICD IMPLANT N/A 02/04/2021   Procedure: ICD IMPLANT;  Surgeon: Waddell Danelle ORN, MD;  Location: [facility] INVASIVE CV LAB;  Service: Cardiovascular;  Laterality: N/A;   MALONEY  DILATION N/A 07/24/2019   Procedure: AGAPITO DILATION;  Surgeon: Shaaron Lamar HERO, MD;  [facility];  Service: Endoscopy;  Laterality: N/A;   MALONEY DILATION N/A 06/03/2021   Procedure: AGAPITO DILATION;  Surgeon: Shaaron Lamar HERO, MD;  [facility];  Service: Endoscopy;  Laterality: N/A;   MAXIMUM ACCESS (MAS)POSTERIOR LUMBAR INTERBODY FUSION (PLIF) 1 LEVEL N/A 08/14/2013   Procedure:  MAXIMUM ACCESS SURGERY(MAS) POSTERIOR LUMBAR INTERBODY FUSION LUMBAR THREE-FOUR ;  Surgeon: Alm GORMAN Molt, MD;  [facility] NEURO [facility];  Service: Neurosurgery;  Laterality: N/A;   MAXIMUM ACCESS SURGERY(MAS) POSTERIOR LUMBAR INTERBODY FUSION LUMBAR THREE-FOUR    PTCA     RIGHT/LEFT HEART CATH AND CORONARY ANGIOGRAPHY N/A 03/26/2020   Procedure: RIGHT/LEFT HEART CATH AND CORONARY ANGIOGRAPHY;  Surgeon: Anner Alm ORN, MD;   Location: [facility] INVASIVE CV LAB;  Service: Cardiovascular;  Laterality: N/A;   SPINAL CORD STIMULATOR BATTERY EXCHANGE N/A 01/19/2021   Procedure: Spinal cord stimulator battery change;  Surgeon: Darlis Deatrice GORMAN, MD;  Location: [facility] OR;  Service: Neurosurgery;  Laterality: N/A;   SPINAL CORD STIMULATOR INSERTION N/A 06/16/2016   Procedure: LUMBAR SPINAL CORD STIMULATOR INSERTION;  Surgeon: Deward Fabian, MD;  [facility];  Service: Neurosurgery;  Laterality: N/A;  LUMBAR SPINAL CORD STIMULATOR INSERTION   TEE WITHOUT CARDIOVERSION N/A 02/01/2023   Procedure: TRANSESOPHAGEAL ECHOCARDIOGRAM (TEE);  Surgeon: Debera Jayson MATSU, MD;  Location: [facility] ORS;  Service: Cardiovascular;  Laterality: N/A;   tens  05/2016   TONSILLECTOMY      No current facility-administered medications for this encounter.   Current Outpatient Medications  Medication Sig Dispense Refill Last Dose/Taking   albuterol  (VENTOLIN  HFA) 108 (90 Base) MCG/ACT inhaler Inhale 1-2 puffs into the lungs every 6 (six) hours as needed for wheezing or shortness of breath. 18 g 0 Taking As Needed   allopurinol  (ZYLOPRIM ) 300 MG tablet Take 300 mg by mouth daily.   Taking   aspirin  EC 81 MG EC tablet Take 1 tablet (81 mg total) by mouth daily.   Taking   atorvastatin  (LIPITOR ) 80 MG tablet Take 80 mg by mouth daily.   Taking   carvedilol  (COREG ) 6.25 MG tablet Take 1 tablet (6.25 mg total) by mouth 2 (two) times daily with a meal. 180 tablet 3 Taking   cholecalciferol (VITAMIN D3) 25 MCG (1000 UT) tablet Take 1,000 Units by mouth daily.   Taking   etanercept (ENBREL SURECLICK) 50 MG/ML injection Inject 50 mg into the skin every Monday.   Taking   Evolocumab  (REPATHA  SURECLICK) 140 MG/ML SOAJ Inject 140 mg into the skin every 14 (fourteen) days. 6 mL 3 Taking   FARXIGA  10 MG TABS tablet TAKE 1 TABLET(10 MG) BY MOUTH DAILY BEFORE BREAKFAST 30 tablet 10 Taking   ferrous sulfate 325 (65 FE) MG tablet Take 325 mg by mouth daily with breakfast.    Taking   fexofenadine (ALLEGRA) 180 MG tablet Take 180 mg by mouth daily.   Taking   fluticasone  (FLONASE ) 50 MCG/ACT nasal spray Place 1 spray into both nostrils daily.   Taking   furosemide  (LASIX ) 40 MG tablet Take 0.5 tablets (20 mg total) by mouth daily. 45 tablet 3 Taking   gabapentin  (NEURONTIN ) 300 MG capsule Take 300 mg by mouth 2 (two) times daily.   Taking   lansoprazole  (PREVACID ) 30 MG capsule Take 1 capsule (30 mg total) by mouth daily before breakfast. (Patient taking differently: Take 30 mg by mouth 2 (two) times daily  before a meal.) 90 capsule 3 Taking Differently   leflunomide  (ARAVA ) 10 MG tablet Take 10 mg by mouth daily.   Taking   MAGNESIUM -OXIDE 400 (240 Mg) MG tablet TAKE 1 TABLET(400 MG) BY MOUTH DAILY 30 tablet 2 Taking   meclizine (ANTIVERT) 12.5 MG tablet Take 12.5 mg by mouth 3 (three) times daily as needed for dizziness.   Taking As Needed   montelukast  (SINGULAIR ) 10 MG tablet Take 10 mg by mouth at bedtime.   Taking   Multiple Vitamins-Minerals (PRESERVISION AREDS 2) CAPS Take 1 capsule by mouth 2 (two) times daily.   Taking   nitroGLYCERIN  (NITROSTAT ) 0.4 MG SL tablet DISSOLVE 1 TABLET UNDER THE  TONGUE EVERY 5 MINUTES AS NEEDED FOR CHEST PAIN. MAX OF 3 TABLETS IN 15 MINUTES. CALL 911 IF PAIN  PERSISTS. 75 tablet 4 Taking   oxyCODONE -acetaminophen  (PERCOCET) 10-325 MG tablet Take 1 tablet by mouth in the morning, at noon, in the evening, and at bedtime.   Taking   sacubitril -valsartan  (ENTRESTO ) 24-26 MG Take 1 tablet by mouth 2 (two) times daily. 60 tablet 6 Taking   TRELEGY ELLIPTA 200-62.5-25 MCG/ACT AEPB Inhale 1 puff into the lungs daily.   Taking   VASCEPA  1 g capsule TAKE 1 CAPSULE(1 GRAM) BY MOUTH TWICE DAILY 120 capsule 10 Taking   vitamin B-12 (CYANOCOBALAMIN ) 250 MCG tablet Take 250 mcg by mouth daily.   Taking   losartan  (COZAAR ) 100 MG tablet Take 1 tablet (100 mg total) by mouth daily. (Patient taking differently: Take 100 mg by mouth daily. Not  taking currently taking Entresto  instead) 90 tablet 1 Not Taking   spironolactone  (ALDACTONE ) 25 MG tablet Take 1 tablet (25 mg total) by mouth daily. (Patient taking differently: Take 25 mg by mouth daily. Not currently taking due to Entresto ) 90 tablet 1 Not Taking   Allergies  Allergen Reactions   Biaxin [Clarithromycin] Rash and Other (See Comments)    Blisters in mouth    Penicillins Rash and Other (See Comments)    Blisters in mouth Has patient had a PCN reaction causing immediate rash, facial/tongue/throat swelling, SOB or lightheadedness with hypotension: Yesyes Has patient had a PCN reaction causing severe rash involving mucus membranes or skin necrosis: Nono Has patient had a PCN reaction that required hospitalization Nono Has patient had a PCN reaction occurring within the last 10 years: Nono If all of the above answers are NO, then may proceed   Hydroxychloroquine  Other (See Comments)   Sulfamethoxazole     Other Reaction(s): Other (See Comments)   Sulfasalazine Other (See Comments)   Losartan  Potassium     Other reaction(s): elevated creatinine    Social History   Tobacco Use   Smoking status: Former    Current packs/day: 0.00    Average packs/day: 0.8 packs/day for 15.0 years (11.3 ttl pk-yrs)    Types: Cigarettes    Start date: 11/21/1979    Quit date: 11/20/1994    Years since quitting: 29.6   Smokeless tobacco: Never   Tobacco comments:    Quit in 1996  Substance Use Topics   Alcohol  use: Never    Alcohol /week: 0.0 standard drinks of alcohol     Family History  Problem Relation Age of Onset   Heart attack Father    Heart attack Mother    Bladder Cancer Brother    Cervical cancer Daughter        ?   Colon cancer Neg Hx      Review of Systems  Negative as relates to the HPI Objective:  Physical Exam Well-developed well-nourished patient in no acute distress. Alert and oriented x3 HEENT:within normal limits Cardiac: Regular rate and  rhythm Pulmonary: Lungs clear to auscultation Abdomen: Soft and nontender.  Normal active bowel sounds  Musculoskeletal:Right knee exam: She has range of motion of 0 to 115 of flexion.  She has a significant valgus angulation to her leg.  Neurovascular status is intact distally.   Vital signs in last 24 hours:    Labs: Recent Results (from the past 2160 hours)  CUP PACEART INCLINIC DEVICE CHECK     Status: None   Collection Time: 04/09/24 10:47 AM  Result Value Ref Range   Date Time Interrogation Session [id number]    Pulse Generator Manufacturer [facility]    Pulse Gen Model [id number] [place] VR    Pulse Gen Serial Number [id number]    Clinic Name [facility]    Implantable Pulse Generator Type Implantable Cardiac Defibulator    Implantable Pulse Generator Implant Date 79779681    Implantable Lead Manufacturer [facility] [employer]    Implantable Lead Model [id number] Durata SJ4    Implantable Lead Serial Number [id number]    Implantable Lead Implant Date 79779681    Implantable Lead Location Detail 1 UNKNOWN    Implantable Lead Location [id number]    Implantable Lead Connection Status [id number]    Lead Channel Setting Sensing Sensitivity 0.5 mV   Lead Channel Setting Pacing Pulse Width 0.5 ms   Lead Channel Setting Pacing Amplitude 2.5 V   Zone Setting Status Active    Zone Setting Status Active    Zone Setting Status [phone]    Lead Channel Impedance Value 437.5 ohm   Lead Channel Sensing Intrinsic Amplitude 12.0 mV   Lead Channel Pacing Threshold Amplitude 0.5 V   Lead Channel Pacing Threshold Pulse Width 0.5 ms   Lead Channel Pacing Threshold Amplitude 0.5 V   Lead Channel Pacing Threshold Pulse Width 0.5 ms   HighPow Impedance 56.25 ohm   Battery Remaining Longevity 90 mo   Brady Statistic RV Percent Paced 0 %   Eval Rhythm VS- 65   Basic metabolic panel with GFR     Status: Abnormal   Collection Time: 04/17/24 11:39 AM  Result Value Ref Range   Sodium 136 135 - 145 mmol/L   Potassium  5.5 (H) 3.5 - 5.1 mmol/L   Chloride 101 98 - 111 mmol/L   CO2 27 22 - 32 mmol/L   Glucose, Bld 108 (H) 70 - 99 mg/dL    Comment: Glucose reference range applies only to samples taken after fasting for at least 8 hours.   BUN 32 (H) 8 - 23 mg/dL   Creatinine, Ser 8.74 (H) 0.44 - 1.00 mg/dL   Calcium  9.3 8.9 - 10.3 mg/dL   GFR, Estimated 45 (L) >60 mL/min    Comment: (NOTE) Calculated using the CKD-EPI Creatinine Equation (2021)    Anion gap 8 5 - 15    Comment: Performed at [facility], [address] [place], [place] [address]  B Nat Peptide     Status: Abnormal   Collection Time: 04/17/24 11:39 AM  Result Value Ref Range   B Natriuretic Peptide 161.7 (H) 0.0 - 100.0 pg/mL    Comment: Performed at [facility], [address] [place], [place] [address]  CBC with Differential/Platelet     Status: Abnormal   Collection Time: 04/18/24  9:36 AM  Result Value Ref Range  WBC 9.6 4.0 - 10.5 K/uL   RBC 3.10 (L) 3.87 - 5.11 MIL/uL   Hemoglobin 10.4 (L) 12.0 - 15.0 g/dL   HCT 66.5 (L) 63.9 - 53.9 %   MCV 107.7 (H) 80.0 - 100.0 fL   MCH 33.5 26.0 - 34.0 pg   MCHC 31.1 30.0 - 36.0 g/dL   RDW 84.6 88.4 - 84.4 %   Platelets 205 150 - 400 K/uL   nRBC 0.0 0.0 - 0.2 %   Neutrophils Relative % 59 %   Neutro Abs 5.7 1.7 - 7.7 K/uL   Lymphocytes Relative 28 %   Lymphs Abs 2.7 0.7 - 4.0 K/uL   Monocytes Relative 9 %   Monocytes Absolute 0.9 0.1 - 1.0 K/uL   Eosinophils Relative 3 %   Eosinophils Absolute 0.3 0.0 - 0.5 K/uL   Basophils Relative 1 %   Basophils Absolute 0.1 0.0 - 0.1 K/uL   Immature Granulocytes 0 %   Abs Immature Granulocytes 0.04 0.00 - 0.07 K/uL    Comment: Performed at [facility], [address], [place], [place] [address]  Basic metabolic panel with GFR     Status: Abnormal   Collection Time: 04/25/24 12:36 PM  Result Value Ref Range   Sodium 140 135 - 145 mmol/L   Potassium 4.7 3.5 - 5.1 mmol/L   Chloride 102 98 - 111 mmol/L   CO2 26 22 - 32 mmol/L    Glucose, Bld 110 (H) 70 - 99 mg/dL    Comment: Glucose reference range applies only to samples taken after fasting for at least 8 hours.   BUN 27 (H) 8 - 23 mg/dL   Creatinine, Ser 8.81 (H) 0.44 - 1.00 mg/dL   Calcium  9.6 8.9 - 10.3 mg/dL   GFR, Estimated 48 (L) >60 mL/min    Comment: (NOTE) Calculated using the CKD-EPI Creatinine Equation (2021)    Anion gap 12 5 - 15    Comment: Performed at [facility], [address] [place], [place] [address]  CUP PACEART REMOTE DEVICE CHECK     Status: None   Collection Time: 05/02/24  2:20 AM  Result Value Ref Range   Date Time Interrogation Session [phone]    Pulse Generator Manufacturer S[employer]    Pulse Gen Model [id number] [place] VR    Pulse Gen Serial Number [id number]    Clinic Name [facility]    Implantable Pulse Generator Type Implantable Cardiac Defibulator    Implantable Pulse Generator Implant Date 79779681    Implantable Lead Manufacturer [facility]    Implantable Lead Model [id number] Durata SJ4    Implantable Lead Serial Number [id number]    Implantable Lead Implant Date 79779681    Implantable Lead Location Detail 1 UNKNOWN    Implantable Lead Location [id number]    Implantable Lead Connection Status [id number]    Lead Channel Setting Sensing Sensitivity 0.5 mV   Lead Channel Setting Sensing Adaptation Mode Adaptive Sensing    Lead Channel Setting Pacing Pulse Width 0.5 ms   Lead Channel Setting Pacing Amplitude 2.5 V   Zone Setting Status Active    Zone Setting Status Active    Zone Setting Status [phone]    Lead Channel Status NULL    Lead Channel Impedance Value 480 ohm   Lead Channel Sensing Intrinsic Amplitude 12.0 mV   Lead Channel Pacing Threshold Amplitude 0.5 V   Lead Channel Pacing Threshold Pulse Width 0.5 ms   HighPow Impedance 51 ohm   HighPow Imped  Status NULL    Battery Status MOS    Battery Remaining Longevity 88 mo   Battery Remaining Percentage 72.0 %   Battery Voltage 2.99 V   Brady Statistic RV  Percent Paced 1.0 %  CBC with Differential     Status: Abnormal   Collection Time: 06/16/24  9:00 AM  Result Value Ref Range   WBC 7.6 4.0 - 10.5 K/uL   RBC 3.06 (L) 3.87 - 5.11 MIL/uL   Hemoglobin 9.8 (L) 12.0 - 15.0 g/dL   HCT 68.0 (L) 63.9 - 53.9 %   MCV 104.2 (H) 80.0 - 100.0 fL   MCH 32.0 26.0 - 34.0 pg   MCHC 30.7 30.0 - 36.0 g/dL   RDW 84.8 88.4 - 84.4 %   Platelets 126 (L) 150 - 400 K/uL   nRBC 0.0 0.0 - 0.2 %   Neutrophils Relative % 52 %   Neutro Abs 3.9 1.7 - 7.7 K/uL   Lymphocytes Relative 37 %   Lymphs Abs 2.8 0.7 - 4.0 K/uL   Monocytes Relative 7 %   Monocytes Absolute 0.6 0.1 - 1.0 K/uL   Eosinophils Relative 4 %   Eosinophils Absolute 0.3 0.0 - 0.5 K/uL   Basophils Relative 0 %   Basophils Absolute 0.0 0.0 - 0.1 K/uL   Immature Granulocytes 0 %   Abs Immature Granulocytes 0.03 0.00 - 0.07 K/uL    Comment: Performed at [facility], [address], [place], [place] [address]  Comprehensive metabolic panel     Status: Abnormal   Collection Time: 06/16/24  9:00 AM  Result Value Ref Range   Sodium 136 135 - 145 mmol/L   Potassium 4.2 3.5 - 5.1 mmol/L   Chloride 100 98 - 111 mmol/L   CO2 27 22 - 32 mmol/L   Glucose, Bld 122 (H) 70 - 99 mg/dL    Comment: Glucose reference range applies only to samples taken after fasting for at least 8 hours.   BUN 27 (H) 8 - 23 mg/dL   Creatinine, Ser 9.00 0.44 - 1.00 mg/dL   Calcium  9.0 8.9 - 10.3 mg/dL   Total Protein 6.5 6.5 - 8.1 g/dL   Albumin  3.5 3.5 - 5.0 g/dL   AST 20 15 - 41 U/L   ALT 21 0 - 44 U/L   Alkaline Phosphatase 66 38 - 126 U/L   Total Bilirubin 0.6 0.0 - 1.2 mg/dL   GFR, Estimated 59 (L) >60 mL/min    Comment: (NOTE) Calculated using the CKD-EPI Creatinine Equation (2021)    Anion gap 9 5 - 15    Comment: Performed at [facility], [address], [place], [place] [address]  Ferritin     Status: None   Collection Time: 06/16/24  9:00 AM  Result Value Ref Range   Ferritin 283 11 - 307 ng/mL    Comment:  Performed at [facility], [address], [place], [place] [address]  Iron  and TIBC ([facility] ONLY)     Status: Abnormal   Collection Time: 06/16/24  9:00 AM  Result Value Ref Range   Iron  136 28 - 170 ug/dL   TIBC 648 749 - 549 ug/dL   Saturation Ratios 39 (H) 10.4 - 31.8 %   UIBC 215 ug/dL    Comment: Performed at [facility], [address], [place], [place] [address]  Glucose, capillary     Status: Abnormal   Collection Time: 06/18/24 11:38 AM  Result Value Ref Range   Glucose-Capillary  125 (H) 70 - 99 mg/dL    Comment: Glucose reference range applies only to samples taken after fasting for at least 8 hours.  CBC per protocol     Status: Abnormal   Collection Time: 06/18/24 11:49 AM  Result Value Ref Range   WBC 7.0 4.0 - 10.5 K/uL   RBC 3.15 (L) 3.87 - 5.11 MIL/uL   Hemoglobin 10.0 (L) 12.0 - 15.0 g/dL   HCT 66.8 (L) 63.9 - 53.9 %   MCV 105.1 (H) 80.0 - 100.0 fL   MCH 31.7 26.0 - 34.0 pg   MCHC 30.2 30.0 - 36.0 g/dL   RDW 84.7 88.4 - 84.4 %   Platelets 152 150 - 400 K/uL   nRBC 0.0 0.0 - 0.2 %    Comment: Performed at [facility], [address], [place], [place] [address]  Hemoglobin A1c per protocol     Status: Abnormal   Collection Time: 06/18/24 11:49 AM  Result Value Ref Range   Hgb A1c MFr Bld 6.4 (H) 4.8 - 5.6 %    Comment: (NOTE) Diagnosis of Diabetes The following HbA1c ranges recommended by the American Diabetes Association (ADA) may be used as an aid in the diagnosis of diabetes mellitus.  Hemoglobin             Suggested A1C NGSP%              Diagnosis  <5.7                   Non Diabetic  5.7-6.4                Pre-Diabetic  >6.4                   Diabetic  <7.0                   Glycemic control for                       adults with diabetes.     Mean Plasma Glucose 136.98 mg/dL    Comment: Performed at [facility], [address], [place], [place] [address]  Basic metabolic panel per protocol      Status: Abnormal   Collection Time: 06/18/24 11:49 AM  Result Value Ref Range   Sodium 140 135 - 145 mmol/L   Potassium 4.3 3.5 - 5.1 mmol/L   Chloride 102 98 - 111 mmol/L   CO2 28 22 - 32 mmol/L   Glucose, Bld 123 (H) 70 - 99 mg/dL    Comment: Glucose reference range applies only to samples taken after fasting for at least 8 hours.   BUN 25 (H) 8 - 23 mg/dL   Creatinine, Ser 9.07 0.44 - 1.00 mg/dL   Calcium  9.6 8.9 - 10.3 mg/dL   GFR, Estimated >39 >39 mL/min    Comment: (NOTE) Calculated using the CKD-EPI Creatinine Equation (2021)    Anion gap 10 5 - 15    Comment: Performed at [facility], [address], [place], [place] [address]  Surgical pcr screen     Status: None   Collection Time: 06/18/24 12:03 PM   Specimen: Nasal Mucosa; Nasal Swab  Result Value Ref Range   MRSA, PCR NEGATIVE NEGATIVE   Staphylococcus aureus NEGATIVE NEGATIVE    Comment: (NOTE) The Xpert SA Assay (FDA approved for NASAL specimens in patients 13 years of age and older),  is one component of a comprehensive surveillance program. It is not intended to diagnose infection nor to guide or monitor treatment. Performed at [facility], [address] [place], [place] [address]      Estimated body mass index is 23.09 kg/m as calculated from the following:   Height as of 06/18/24: 5' 2.5 (1.588 m).   Weight as of 06/18/24: 58.2 kg.   Imaging Review Plain radiographs demonstrate severe degenerative joint disease of the right knee(s). The overall alignment issignificant valgus. The bone quality appears to be good for age and reported activity level.      Assessment/Plan:  End stage arthritis, right knee   The patient history, physical examination, clinical judgment of the provider and imaging studies are consistent with end stage degenerative joint disease of the right knee(s) and total knee arthroplasty is deemed medically necessary. The treatment options  including medical management, injection therapy arthroscopy and arthroplasty were discussed at length. The risks and benefits of total knee arthroplasty were presented and reviewed. The risks due to aseptic loosening, infection, stiffness, patella tracking problems, thromboembolic complications and other imponderables were discussed. The patient acknowledged the explanation, agreed to proceed with the plan and consent was signed. Patient is being admitted for inpatient treatment for surgery, pain control, PT, OT, prophylactic antibiotics, VTE prophylaxis, progressive ambulation and ADL's and discharge planning. The patient is planning to be discharged home with home health services  we will do home health physical therapy 2 weeks and then do outpatient therapy. He cannot take NSAIDs so cannot take Celebrex postoperatively.     Patient's anticipated LOS is less than 2 midnights, meeting these requirements:  - Lives within 1 hour of care - Has a competent adult at home to recover with post-op recover - NO history of  - Chronic pain requiring opiods  - Diabetes  - Coronary Artery Disease  - Heart failure  - Heart attack  - Stroke  - DVT/VTE  - Cardiac arrhythmia  - Respiratory Failure/COPD    - Anemia  - Advanced Liver disease

## 2024-06-30 ENCOUNTER — Ambulatory Visit (HOSPITAL_COMMUNITY): Payer: Self-pay | Admitting: Medical

## 2024-06-30 ENCOUNTER — Telehealth (HOSPITAL_COMMUNITY): Payer: Self-pay | Admitting: Pharmacy Technician

## 2024-06-30 ENCOUNTER — Encounter (HOSPITAL_COMMUNITY): Payer: Self-pay | Admitting: Orthopedic Surgery

## 2024-06-30 ENCOUNTER — Encounter (HOSPITAL_COMMUNITY)
Admission: RE | Disposition: A | Discharge status: Home / Self Care | Payer: Self-pay | Source: Home / Self Care | Attending: Orthopedic Surgery

## 2024-06-30 ENCOUNTER — Other Ambulatory Visit: Payer: Self-pay

## 2024-06-30 ENCOUNTER — Other Ambulatory Visit (HOSPITAL_COMMUNITY): Payer: Self-pay

## 2024-06-30 ENCOUNTER — Ambulatory Visit (HOSPITAL_BASED_OUTPATIENT_CLINIC_OR_DEPARTMENT_OTHER): Admitting: Anesthesiology

## 2024-06-30 ENCOUNTER — Ambulatory Visit (HOSPITAL_COMMUNITY)
Admission: RE | Admit: 2024-06-30 | Discharge: 2024-07-01 | Disposition: A | Discharge status: Home / Self Care | Attending: Orthopedic Surgery | Admitting: Orthopedic Surgery

## 2024-06-30 DIAGNOSIS — J449 Chronic obstructive pulmonary disease, unspecified: Secondary | ICD-10-CM | POA: Insufficient documentation

## 2024-06-30 DIAGNOSIS — D631 Anemia in chronic kidney disease: Secondary | ICD-10-CM | POA: Insufficient documentation

## 2024-06-30 DIAGNOSIS — E1122 Type 2 diabetes mellitus with diabetic chronic kidney disease: Secondary | ICD-10-CM | POA: Diagnosis not present

## 2024-06-30 DIAGNOSIS — E785 Hyperlipidemia, unspecified: Secondary | ICD-10-CM | POA: Diagnosis not present

## 2024-06-30 DIAGNOSIS — I130 Hypertensive heart and chronic kidney disease with heart failure and stage 1 through stage 4 chronic kidney disease, or unspecified chronic kidney disease: Secondary | ICD-10-CM | POA: Insufficient documentation

## 2024-06-30 DIAGNOSIS — M1711 Unilateral primary osteoarthritis, right knee: Secondary | ICD-10-CM | POA: Diagnosis present

## 2024-06-30 DIAGNOSIS — I252 Old myocardial infarction: Secondary | ICD-10-CM

## 2024-06-30 DIAGNOSIS — Z7984 Long term (current) use of oral hypoglycemic drugs: Secondary | ICD-10-CM | POA: Diagnosis not present

## 2024-06-30 DIAGNOSIS — N189 Chronic kidney disease, unspecified: Secondary | ICD-10-CM | POA: Diagnosis not present

## 2024-06-30 DIAGNOSIS — I2510 Atherosclerotic heart disease of native coronary artery without angina pectoris: Secondary | ICD-10-CM | POA: Diagnosis not present

## 2024-06-30 DIAGNOSIS — I5022 Chronic systolic (congestive) heart failure: Secondary | ICD-10-CM | POA: Insufficient documentation

## 2024-06-30 DIAGNOSIS — Z95810 Presence of automatic (implantable) cardiac defibrillator: Secondary | ICD-10-CM | POA: Diagnosis not present

## 2024-06-30 DIAGNOSIS — G8918 Other acute postprocedural pain: Secondary | ICD-10-CM | POA: Diagnosis not present

## 2024-06-30 DIAGNOSIS — K219 Gastro-esophageal reflux disease without esophagitis: Secondary | ICD-10-CM | POA: Insufficient documentation

## 2024-06-30 DIAGNOSIS — Z96651 Presence of right artificial knee joint: Secondary | ICD-10-CM | POA: Diagnosis not present

## 2024-06-30 DIAGNOSIS — Z01818 Encounter for other preprocedural examination: Secondary | ICD-10-CM

## 2024-06-30 HISTORY — PX: TOTAL KNEE ARTHROPLASTY: SHX125

## 2024-06-30 LAB — GLUCOSE, CAPILLARY
Glucose-Capillary: 132 mg/dL — ABNORMAL HIGH (ref 70–99)
Glucose-Capillary: 228 mg/dL — ABNORMAL HIGH (ref 70–99)

## 2024-06-30 SURGERY — ARTHROPLASTY, KNEE, TOTAL
Anesthesia: Regional | Site: Knee | Laterality: Right

## 2024-06-30 MED ORDER — HYDROMORPHONE HCL 1 MG/ML IJ SOLN
0.5000 mg | INTRAMUSCULAR | Status: DC | PRN
  Administered 2024-06-30 (×2): 1 mg via INTRAVENOUS
  Filled 2024-06-30: qty 1

## 2024-06-30 MED ORDER — INSULIN ASPART 100 UNIT/ML IJ SOLN: 0.0000 [IU] | INTRAMUSCULAR | Status: DC | PRN

## 2024-06-30 MED ORDER — FLUTICASONE PROPIONATE 50 MCG/ACT NA SUSP
1.0000 | Freq: Every day | NASAL | Status: DC
  Administered 2024-06-30 – 2024-07-01 (×4): 1 via NASAL
  Filled 2024-06-30: qty 16

## 2024-06-30 MED ORDER — METHOCARBAMOL 1000 MG/10ML IJ SOLN: 500.0000 mg | Freq: Four times a day (QID) | INTRAMUSCULAR | Status: DC | PRN

## 2024-06-30 MED ORDER — CEFAZOLIN SODIUM-DEXTROSE 2-4 GM/100ML-% IV SOLN
2.0000 g | INTRAVENOUS | Status: AC
  Administered 2024-06-30 (×2): 2 g via INTRAVENOUS
  Filled 2024-06-30: qty 100

## 2024-06-30 MED ORDER — ROCURONIUM BROMIDE 100 MG/10ML IV SOLN
INTRAVENOUS | Status: DC | PRN
  Administered 2024-06-30 (×2): 40 mg via INTRAVENOUS

## 2024-06-30 MED ORDER — TRAMADOL HCL 50 MG PO TABS
50.0000 mg | ORAL_TABLET | Freq: Four times a day (QID) | ORAL | Status: DC
  Administered 2024-06-30 – 2024-07-01 (×8): 50 mg via ORAL
  Filled 2024-06-30 (×4): qty 1

## 2024-06-30 MED ORDER — ORAL CARE MOUTH RINSE: 15.0000 mL | Freq: Once | OROMUCOSAL | Status: AC

## 2024-06-30 MED ORDER — LIDOCAINE HCL (PF) 2 % IJ SOLN
INTRAMUSCULAR | Status: AC
  Filled 2024-06-30: qty 5

## 2024-06-30 MED ORDER — PANTOPRAZOLE SODIUM 40 MG PO TBEC
40.0000 mg | DELAYED_RELEASE_TABLET | Freq: Every day | ORAL | Status: DC
  Administered 2024-06-30 – 2024-07-01 (×4): 40 mg via ORAL
  Filled 2024-06-30 (×2): qty 1

## 2024-06-30 MED ORDER — EPHEDRINE 5 MG/ML INJ
INTRAVENOUS | Status: AC
  Filled 2024-06-30: qty 5

## 2024-06-30 MED ORDER — FENTANYL CITRATE (PF) 100 MCG/2ML IJ SOLN
INTRAMUSCULAR | Status: DC | PRN
  Administered 2024-06-30 (×4): 50 ug via INTRAVENOUS

## 2024-06-30 MED ORDER — FUROSEMIDE 20 MG PO TABS
20.0000 mg | ORAL_TABLET | Freq: Every day | ORAL | Status: DC
  Administered 2024-06-30 – 2024-07-01 (×4): 20 mg via ORAL
  Filled 2024-06-30 (×2): qty 1

## 2024-06-30 MED ORDER — BUPIVACAINE-EPINEPHRINE (PF) 0.5% -1:200000 IJ SOLN
INTRAMUSCULAR | Status: AC
  Filled 2024-06-30: qty 30

## 2024-06-30 MED ORDER — LORATADINE 10 MG PO TABS
10.0000 mg | ORAL_TABLET | Freq: Every day | ORAL | Status: DC
  Administered 2024-07-01 (×2): 10 mg via ORAL
  Filled 2024-06-30: qty 1

## 2024-06-30 MED ORDER — PROPOFOL 1000 MG/100ML IV EMUL
INTRAVENOUS | Status: AC
Start: 2024-06-30 — End: 2024-06-30
  Filled 2024-06-30: qty 100

## 2024-06-30 MED ORDER — SUGAMMADEX SODIUM 200 MG/2ML IV SOLN
INTRAVENOUS | Status: AC
Start: 2024-06-30 — End: 2024-06-30
  Filled 2024-06-30: qty 2

## 2024-06-30 MED ORDER — INSULIN ASPART 100 UNIT/ML IJ SOLN
0.0000 [IU] | Freq: Three times a day (TID) | INTRAMUSCULAR | Status: DC
  Administered 2024-06-30 (×2): 5 [IU] via SUBCUTANEOUS
  Administered 2024-07-01 (×4): 3 [IU] via SUBCUTANEOUS

## 2024-06-30 MED ORDER — METOCLOPRAMIDE HCL 5 MG PO TABS: 5.0000 mg | ORAL_TABLET | Freq: Three times a day (TID) | ORAL | Status: DC | PRN

## 2024-06-30 MED ORDER — METHOCARBAMOL 500 MG PO TABS
500.0000 mg | ORAL_TABLET | Freq: Four times a day (QID) | ORAL | Status: DC | PRN
  Administered 2024-06-30 – 2024-07-01 (×4): 500 mg via ORAL
  Filled 2024-06-30 (×2): qty 1

## 2024-06-30 MED ORDER — ALLOPURINOL 300 MG PO TABS
300.0000 mg | ORAL_TABLET | Freq: Every day | ORAL | Status: DC
  Administered 2024-07-01 (×2): 300 mg via ORAL
  Filled 2024-06-30: qty 1

## 2024-06-30 MED ORDER — CHLORHEXIDINE GLUCONATE 0.12 % MT SOLN
15.0000 mL | Freq: Once | OROMUCOSAL | Status: AC
  Administered 2024-06-30 (×2): 15 mL via OROMUCOSAL

## 2024-06-30 MED ORDER — ONDANSETRON HCL 4 MG/2ML IJ SOLN
4.0000 mg | Freq: Four times a day (QID) | INTRAMUSCULAR | Status: DC | PRN
  Administered 2024-06-30: 4 mg via INTRAVENOUS
  Filled 2024-06-30: qty 2

## 2024-06-30 MED ORDER — BUPIVACAINE LIPOSOME 1.3 % IJ SUSP
INTRAMUSCULAR | Status: AC
  Filled 2024-06-30: qty 20

## 2024-06-30 MED ORDER — ONDANSETRON HCL 4 MG/2ML IJ SOLN
INTRAMUSCULAR | Status: DC | PRN
  Administered 2024-06-30 (×2): 4 mg via INTRAVENOUS

## 2024-06-30 MED ORDER — PHENOL 1.4 % MT LIQD: 1.0000 | OROMUCOSAL | Status: DC | PRN

## 2024-06-30 MED ORDER — ACETAMINOPHEN 10 MG/ML IV SOLN
INTRAVENOUS | Status: AC
  Filled 2024-06-30: qty 100

## 2024-06-30 MED ORDER — PRESERVISION AREDS 2 PO CAPS: 1.0000 | ORAL_CAPSULE | Freq: Two times a day (BID) | ORAL | Status: DC

## 2024-06-30 MED ORDER — POVIDONE-IODINE 10 % EX SWAB: 2.0000 | Freq: Once | CUTANEOUS | Status: DC

## 2024-06-30 MED ORDER — ONDANSETRON HCL 4 MG PO TABS: 4.0000 mg | ORAL_TABLET | Freq: Four times a day (QID) | ORAL | Status: DC | PRN

## 2024-06-30 MED ORDER — DOCUSATE SODIUM 100 MG PO CAPS
100.0000 mg | ORAL_CAPSULE | Freq: Two times a day (BID) | ORAL | Status: DC
  Administered 2024-06-30 – 2024-07-01 (×4): 100 mg via ORAL
  Filled 2024-06-30 (×2): qty 1

## 2024-06-30 MED ORDER — DEXAMETHASONE SODIUM PHOSPHATE 10 MG/ML IJ SOLN
INTRAMUSCULAR | Status: DC | PRN
  Administered 2024-06-30 (×2): 8 mg via INTRAVENOUS

## 2024-06-30 MED ORDER — BUPIVACAINE-EPINEPHRINE (PF) 0.5% -1:200000 IJ SOLN
INTRAMUSCULAR | Status: DC | PRN
  Administered 2024-06-30 (×2): 80 mL

## 2024-06-30 MED ORDER — POLYETHYLENE GLYCOL 3350 17 G PO PACK: 17.0000 g | PACK | Freq: Every day | ORAL | Status: DC | PRN

## 2024-06-30 MED ORDER — FLEET ENEMA RE ENEM: 1.0000 | ENEMA | Freq: Once | RECTAL | Status: DC | PRN

## 2024-06-30 MED ORDER — PROSIGHT PO TABS
1.0000 | ORAL_TABLET | Freq: Two times a day (BID) | ORAL | Status: DC
  Administered 2024-06-30 – 2024-07-01 (×4): 1 via ORAL
  Filled 2024-06-30 (×2): qty 1

## 2024-06-30 MED ORDER — ETOMIDATE 2 MG/ML IV SOLN
INTRAVENOUS | Status: DC | PRN
  Administered 2024-06-30 (×2): 10 mg via INTRAVENOUS

## 2024-06-30 MED ORDER — ICOSAPENT ETHYL 1 G PO CAPS
1.0000 g | ORAL_CAPSULE | Freq: Two times a day (BID) | ORAL | Status: DC
  Administered 2024-06-30 – 2024-07-01 (×4): 1 g via ORAL
  Filled 2024-06-30 (×2): qty 1

## 2024-06-30 MED ORDER — BUDESON-GLYCOPYRROL-FORMOTEROL 160-9-4.8 MCG/ACT IN AERO
2.0000 | INHALATION_SPRAY | Freq: Two times a day (BID) | RESPIRATORY_TRACT | Status: DC
  Administered 2024-06-30 – 2024-07-01 (×4): 2 via RESPIRATORY_TRACT
  Filled 2024-06-30: qty 5.9

## 2024-06-30 MED ORDER — ACETAMINOPHEN 325 MG PO TABS: 325.0000 mg | ORAL_TABLET | Freq: Four times a day (QID) | ORAL | Status: DC | PRN

## 2024-06-30 MED ORDER — ALBUTEROL SULFATE (2.5 MG/3ML) 0.083% IN NEBU
3.0000 mL | INHALATION_SOLUTION | Freq: Four times a day (QID) | RESPIRATORY_TRACT | Status: DC | PRN
Start: 2024-06-30 — End: 2024-07-01

## 2024-06-30 MED ORDER — PHENYLEPHRINE HCL-NACL 20-0.9 MG/250ML-% IV SOLN
INTRAVENOUS | Status: DC | PRN
  Administered 2024-06-30 (×2): 25 ug/min via INTRAVENOUS

## 2024-06-30 MED ORDER — KCL IN DEXTROSE-NACL 20-5-0.45 MEQ/L-%-% IV SOLN
INTRAVENOUS | Status: DC
  Filled 2024-06-30 (×2): qty 1000

## 2024-06-30 MED ORDER — GABAPENTIN 300 MG PO CAPS
300.0000 mg | ORAL_CAPSULE | Freq: Two times a day (BID) | ORAL | Status: DC
  Administered 2024-06-30 – 2024-07-01 (×4): 300 mg via ORAL
  Filled 2024-06-30 (×2): qty 1

## 2024-06-30 MED ORDER — PROPOFOL 10 MG/ML IV BOLUS
INTRAVENOUS | Status: AC
  Filled 2024-06-30: qty 20

## 2024-06-30 MED ORDER — ACETAMINOPHEN 500 MG PO TABS
1000.0000 mg | ORAL_TABLET | Freq: Four times a day (QID) | ORAL | Status: AC
  Administered 2024-06-30 – 2024-07-01 (×6): 1000 mg via ORAL
  Filled 2024-06-30 (×3): qty 2

## 2024-06-30 MED ORDER — FENTANYL CITRATE (PF) 100 MCG/2ML IJ SOLN
INTRAMUSCULAR | Status: AC
Start: 2024-06-30 — End: 2024-06-30
  Filled 2024-06-30: qty 2

## 2024-06-30 MED ORDER — TRANEXAMIC ACID-NACL 1000-0.7 MG/100ML-% IV SOLN
1000.0000 mg | INTRAVENOUS | Status: AC
  Administered 2024-06-30 (×2): 1000 mg via INTRAVENOUS
  Filled 2024-06-30: qty 100

## 2024-06-30 MED ORDER — MENTHOL 3 MG MT LOZG: 1.0000 | LOZENGE | OROMUCOSAL | Status: DC | PRN

## 2024-06-30 MED ORDER — SUGAMMADEX SODIUM 200 MG/2ML IV SOLN
INTRAVENOUS | Status: DC | PRN
Start: 2024-06-30 — End: 2024-06-30
  Administered 2024-06-30 (×2): 100 mg via INTRAVENOUS

## 2024-06-30 MED ORDER — METOCLOPRAMIDE HCL 5 MG/ML IJ SOLN: 5.0000 mg | Freq: Three times a day (TID) | INTRAMUSCULAR | Status: DC | PRN

## 2024-06-30 MED ORDER — FENTANYL CITRATE PF 50 MCG/ML IJ SOSY
50.0000 ug | PREFILLED_SYRINGE | INTRAMUSCULAR | Status: DC
  Administered 2024-06-30 (×2): 50 ug via INTRAVENOUS
  Filled 2024-06-30: qty 2

## 2024-06-30 MED ORDER — DAPAGLIFLOZIN PROPANEDIOL 10 MG PO TABS
10.0000 mg | ORAL_TABLET | Freq: Every day | ORAL | Status: DC
  Administered 2024-07-01 (×2): 10 mg via ORAL
  Filled 2024-06-30: qty 1

## 2024-06-30 MED ORDER — ACETAMINOPHEN 500 MG PO TABS
1000.0000 mg | ORAL_TABLET | Freq: Once | ORAL | Status: DC
  Filled 2024-06-30: qty 2

## 2024-06-30 MED ORDER — ACETAMINOPHEN 10 MG/ML IV SOLN
INTRAVENOUS | Status: DC | PRN
  Administered 2024-06-30 (×2): 1000 mg via INTRAVENOUS

## 2024-06-30 MED ORDER — TRANEXAMIC ACID-NACL 1000-0.7 MG/100ML-% IV SOLN
1000.0000 mg | Freq: Once | INTRAVENOUS | Status: AC
  Administered 2024-06-30 (×2): 1000 mg via INTRAVENOUS
  Filled 2024-06-30: qty 100

## 2024-06-30 MED ORDER — SODIUM CHLORIDE 0.9 % IR SOLN
Status: DC | PRN
  Administered 2024-06-30 (×2): 1000 mL

## 2024-06-30 MED ORDER — SODIUM CHLORIDE (PF) 0.9 % IJ SOLN
INTRAMUSCULAR | Status: AC
  Filled 2024-06-30: qty 50

## 2024-06-30 MED ORDER — DEXAMETHASONE SODIUM PHOSPHATE 10 MG/ML IJ SOLN
INTRAMUSCULAR | Status: AC
  Filled 2024-06-30: qty 1

## 2024-06-30 MED ORDER — APIXABAN 2.5 MG PO TABS
2.5000 mg | ORAL_TABLET | Freq: Two times a day (BID) | ORAL | Status: DC
  Administered 2024-07-01 (×2): 2.5 mg via ORAL
  Filled 2024-06-30: qty 1

## 2024-06-30 MED ORDER — PHENYLEPHRINE 80 MCG/ML (10ML) SYRINGE FOR IV PUSH (FOR BLOOD PRESSURE SUPPORT)
PREFILLED_SYRINGE | INTRAVENOUS | Status: AC
  Filled 2024-06-30: qty 10

## 2024-06-30 MED ORDER — SACUBITRIL-VALSARTAN 24-26 MG PO TABS
1.0000 | ORAL_TABLET | Freq: Two times a day (BID) | ORAL | Status: DC
  Administered 2024-06-30 – 2024-07-01 (×6): 1 via ORAL
  Filled 2024-06-30 (×3): qty 1

## 2024-06-30 MED ORDER — OXYCODONE HCL 5 MG PO TABS
5.0000 mg | ORAL_TABLET | ORAL | Status: DC | PRN
  Administered 2024-06-30 – 2024-07-01 (×8): 10 mg via ORAL
  Filled 2024-06-30 (×4): qty 2

## 2024-06-30 MED ORDER — MONTELUKAST SODIUM 10 MG PO TABS
10.0000 mg | ORAL_TABLET | Freq: Every day | ORAL | Status: DC
Start: 2024-06-30 — End: 2024-07-01
  Administered 2024-06-30 (×2): 10 mg via ORAL
  Filled 2024-06-30: qty 1

## 2024-06-30 MED ORDER — ROCURONIUM BROMIDE 10 MG/ML (PF) SYRINGE
PREFILLED_SYRINGE | INTRAVENOUS | Status: AC
  Filled 2024-06-30: qty 10

## 2024-06-30 MED ORDER — FENTANYL CITRATE PF 50 MCG/ML IJ SOSY
25.0000 ug | PREFILLED_SYRINGE | INTRAMUSCULAR | Status: DC | PRN
  Administered 2024-06-30 (×2): 25 ug via INTRAVENOUS

## 2024-06-30 MED ORDER — DIPHENHYDRAMINE HCL 12.5 MG/5ML PO ELIX: 12.5000 mg | ORAL_SOLUTION | ORAL | Status: DC | PRN

## 2024-06-30 MED ORDER — BUPIVACAINE LIPOSOME 1.3 % IJ SUSP: 20.0000 mL | Freq: Once | INTRAMUSCULAR | Status: DC

## 2024-06-30 MED ORDER — ALUM & MAG HYDROXIDE-SIMETH 200-200-20 MG/5ML PO SUSP: 30.0000 mL | ORAL | Status: DC | PRN

## 2024-06-30 MED ORDER — LEFLUNOMIDE 10 MG PO TABS
10.0000 mg | ORAL_TABLET | Freq: Every day | ORAL | Status: DC
  Administered 2024-06-30 – 2024-07-01 (×4): 10 mg via ORAL
  Filled 2024-06-30 (×2): qty 1

## 2024-06-30 MED ORDER — BISACODYL 5 MG PO TBEC: 5.0000 mg | DELAYED_RELEASE_TABLET | Freq: Every day | ORAL | Status: DC | PRN

## 2024-06-30 MED ORDER — 0.9 % SODIUM CHLORIDE (POUR BTL) OPTIME
TOPICAL | Status: DC | PRN
  Administered 2024-06-30 (×2): 1000 mL

## 2024-06-30 MED ORDER — ZOLPIDEM TARTRATE 5 MG PO TABS: 5.0000 mg | ORAL_TABLET | Freq: Every evening | ORAL | Status: DC | PRN

## 2024-06-30 MED ORDER — PHENYLEPHRINE 80 MCG/ML (10ML) SYRINGE FOR IV PUSH (FOR BLOOD PRESSURE SUPPORT)
PREFILLED_SYRINGE | INTRAVENOUS | Status: DC | PRN
  Administered 2024-06-30 (×2): 80 ug via INTRAVENOUS

## 2024-06-30 MED ORDER — BUPIVACAINE-EPINEPHRINE (PF) 0.5% -1:200000 IJ SOLN
INTRAMUSCULAR | Status: DC | PRN
Start: 2024-06-30 — End: 2024-06-30
  Administered 2024-06-30 (×2): 20 mL via PERINEURAL

## 2024-06-30 MED ORDER — WATER FOR IRRIGATION, STERILE IR SOLN
Status: DC | PRN
  Administered 2024-06-30 (×2): 2000 mL

## 2024-06-30 MED ORDER — ONDANSETRON HCL 4 MG/2ML IJ SOLN
INTRAMUSCULAR | Status: AC
Start: 2024-06-30 — End: 2024-06-30
  Filled 2024-06-30: qty 2

## 2024-06-30 MED ORDER — FENTANYL CITRATE PF 50 MCG/ML IJ SOSY
PREFILLED_SYRINGE | INTRAMUSCULAR | Status: AC
  Filled 2024-06-30: qty 1

## 2024-06-30 MED ORDER — CARVEDILOL 6.25 MG PO TABS
6.2500 mg | ORAL_TABLET | Freq: Two times a day (BID) | ORAL | Status: DC
  Administered 2024-06-30 – 2024-07-01 (×4): 6.25 mg via ORAL
  Filled 2024-06-30 (×2): qty 1

## 2024-06-30 MED ORDER — MECLIZINE HCL 25 MG PO TABS: 12.5000 mg | ORAL_TABLET | Freq: Three times a day (TID) | ORAL | Status: DC | PRN

## 2024-06-30 MED ORDER — PROPOFOL 1000 MG/100ML IV EMUL
INTRAVENOUS | Status: AC
  Filled 2024-06-30: qty 100

## 2024-06-30 MED ORDER — PROPOFOL 500 MG/50ML IV EMUL
INTRAVENOUS | Status: DC | PRN
  Administered 2024-06-30 (×2): 75 ug/kg/min via INTRAVENOUS

## 2024-06-30 MED ORDER — LACTATED RINGERS IV SOLN: INTRAVENOUS | Status: DC

## 2024-06-30 SURGICAL SUPPLY — 46 items
ATTUNE PSFEM RTSZ5 NARCEM KNEE (Femur) IMPLANT
ATTUNE PSRP INSR SZ5 8 KNEE (Insert) IMPLANT
BAG COUNTER SPONGE SURGICOUNT (BAG) IMPLANT
BAG ZIPLOCK 12X15 (MISCELLANEOUS) ×1 IMPLANT
BASEPLATE TIBIAL ROTATING SZ 4 (Knees) IMPLANT
BLADE SAGITTAL 25.0X1.19X90 (BLADE) ×1 IMPLANT
BLADE SAW SGTL 13.0X1.19X90.0M (BLADE) ×1 IMPLANT
BNDG ELASTIC 6INX 5YD STR LF (GAUZE/BANDAGES/DRESSINGS) ×1 IMPLANT
BOOTIES KNEE HIGH SLOAN (MISCELLANEOUS) ×1 IMPLANT
BOWL SMART MIX CTS (DISPOSABLE) ×1 IMPLANT
CEMENT HV SMART SET (Cement) ×2 IMPLANT
COVER SURGICAL LIGHT HANDLE (MISCELLANEOUS) ×1 IMPLANT
CUFF TRNQT CYL 34X4.125X (TOURNIQUET CUFF) ×1 IMPLANT
DERMABOND ADVANCED .7 DNX12 (GAUZE/BANDAGES/DRESSINGS) ×1 IMPLANT
DRAPE INCISE IOBAN 66X45 STRL (DRAPES) ×1 IMPLANT
DRAPE U-SHAPE 47X51 STRL (DRAPES) ×1 IMPLANT
DRSG AQUACEL AG ADV 3.5X10 (GAUZE/BANDAGES/DRESSINGS) ×1 IMPLANT
DURAPREP 26ML APPLICATOR (WOUND CARE) ×1 IMPLANT
ELECT PENCIL ROCKER SW 15FT (MISCELLANEOUS) ×1 IMPLANT
ELECT REM PT RETURN 15FT ADLT (MISCELLANEOUS) ×1 IMPLANT
GLOVE BIOGEL PI IND STRL 8 (GLOVE) ×2 IMPLANT
GLOVE ECLIPSE 7.5 STRL STRAW (GLOVE) ×2 IMPLANT
GOWN STRL REUS W/ TWL XL LVL3 (GOWN DISPOSABLE) ×2 IMPLANT
HOLDER FOLEY CATH W/STRAP (MISCELLANEOUS) IMPLANT
HOOD PEEL AWAY T7 (MISCELLANEOUS) ×3 IMPLANT
KIT TURNOVER KIT A (KITS) ×1 IMPLANT
MANIFOLD NEPTUNE II (INSTRUMENTS) ×1 IMPLANT
NDL HYPO 22X1.5 SAFETY MO (MISCELLANEOUS) ×2 IMPLANT
NEEDLE HYPO 22X1.5 SAFETY MO (MISCELLANEOUS) ×2 IMPLANT
NS IRRIG 1000ML POUR BTL (IV SOLUTION) ×1 IMPLANT
PACK TOTAL KNEE CUSTOM (KITS) ×1 IMPLANT
PADDING CAST COTTON 6X4 STRL (CAST SUPPLIES) ×1 IMPLANT
PATELLA MEDIAL ATTUN 35MM KNEE (Knees) IMPLANT
PIN STEINMAN FIXATION KNEE (PIN) IMPLANT
PROTECTOR NERVE ULNAR (MISCELLANEOUS) ×1 IMPLANT
SET HNDPC FAN SPRY TIP SCT (DISPOSABLE) ×1 IMPLANT
SPIKE FLUID TRANSFER (MISCELLANEOUS) ×2 IMPLANT
SUT MNCRL AB 3-0 PS2 18 (SUTURE) ×1 IMPLANT
SUT VIC AB 0 CT1 36 (SUTURE) ×2 IMPLANT
SUT VIC AB 1 CT1 36 (SUTURE) ×2 IMPLANT
SUT VIC AB 2-0 CT1 TAPERPNT 27 (SUTURE) ×1 IMPLANT
SYR CONTROL 10ML LL (SYRINGE) ×2 IMPLANT
TRAY CATH INTERMITTENT SS 16FR (CATHETERS) IMPLANT
TUBE SUCTION HIGH CAP CLEAR NV (SUCTIONS) ×1 IMPLANT
WATER STERILE IRR 1000ML POUR (IV SOLUTION) ×2 IMPLANT
WRAP KNEE MAXI GEL POST OP (GAUZE/BANDAGES/DRESSINGS) ×1 IMPLANT

## 2024-06-30 NOTE — Evaluation (Signed)
 Physical Therapy Evaluation Patient Details Name: Katherine Larson MRN: [id number] DOB: 10-07-1946 Today's Date: 06/30/2024  History of Present Illness  Pt is 78 yo female admitted on 06/30/24 for R TKA.  Pt with hx including but not limited to anemia, CAD, COPD, GERD, back pain, HTN, DM2, HLD, AICD, CHF, MI, back surgery, spinal stimulator  Clinical Impression  Pt is s/p TKA resulting in the deficits listed below (see PT Problem List). At baseline, pt independent and ambulatory with RW. She has home support, DME, and ramp to enter.  Today, pt motivated to work with therapy.  She required min A to transfer to chair but unable to progress further due to mild nausea.  Pt with good pain control (premedicated), ROM, and quad activation. Pt is legally blind and needed cues for directions. Expected to progress well.  Pt will benefit from acute skilled PT to increase their independence and safety with mobility to allow discharge.          If plan is discharge home, recommend the following: A little help with walking and/or transfers;A little help with bathing/dressing/bathroom;Assistance with cooking/housework;Help with stairs or ramp for entrance   Can travel by private vehicle        Equipment Recommendations None recommended by PT  Recommendations for Other Services       Functional Status Assessment Patient has had a recent decline in their functional status and demonstrates the ability to make significant improvements in function in a reasonable and predictable amount of time.     Precautions / Restrictions Precautions Precautions: Fall;Knee Precaution/Restrictions Comments: Legally Blind Restrictions RLE Weight Bearing Per Provider Order: Weight bearing as tolerated      Mobility  Bed Mobility Overal bed mobility: Needs Assistance Bed Mobility: Supine to Sit     Supine to sit: Min assist          Transfers Overall transfer level: Needs assistance Equipment used: Rolling  walker (2 wheels) Transfers: Sit to/from Stand Sit to Stand: Min assist           General transfer comment: cues for hand placement and R LE management    Ambulation/Gait Ambulation/Gait assistance: Min assist Gait Distance (Feet): 4 Feet Assistive device: Rolling walker (2 wheels) Gait Pattern/deviations: Step-to pattern, Decreased stride length, Decreased weight shift to right Gait velocity: decreased     General Gait Details: Small steps to the chair; limited by nausea  Stairs            Wheelchair Mobility     Tilt Bed    Modified Rankin (Stroke Patients Only)       Balance Overall balance assessment: Needs assistance Sitting-balance support: No upper extremity supported Sitting balance-Leahy Scale: Good     Standing balance support: Bilateral upper extremity supported, Reliant on assistive device for balance Standing balance-Leahy Scale: Poor                               Pertinent Vitals/Pain Pain Assessment Pain Assessment: 0-10 Pain Score: 4  Pain Location: R knee Pain Descriptors / Indicators: Discomfort Pain Intervention(s): Limited activity within patient's tolerance, Monitored during session, Premedicated before session, Repositioned    Home Living Family/patient expects to be discharged to:: Private residence Living Arrangements: Spouse/significant other Available Help at Discharge: Family;Available 24 hours/day (spouse there; dtr intermittently; has cameras also) Type of Home: House Home Access: Ramped entrance       Home Layout: One level Home  Equipment: Grab bars - tub/shower;Shower Counsellor (2 wheels);Cane - single point      Prior Function Prior Level of Function : Independent/Modified Independent             Mobility Comments: Could ambulate with RW prior; ambulate short community distances ADLs Comments: Pt independent with adls; assist with iadls as able with legally blind     Extremity/Trunk  Assessment   Upper Extremity Assessment Upper Extremity Assessment: Overall WFL for tasks assessed    Lower Extremity Assessment Lower Extremity Assessment: LLE deficits/detail;RLE deficits/detail RLE Deficits / Details: Expected post op changes; ROM knee 5 to 85 degrees; MMT ankle 5/5, knee and hip 3/5 not further tested LLE Deficits / Details: ROM WFL; MMT 5/5    Cervical / Trunk Assessment Cervical / Trunk Assessment: Kyphotic  Communication        Cognition Arousal: Alert Behavior During Therapy: WFL for tasks assessed/performed   PT - Cognitive impairments: No apparent impairments                                 Cueing       General Comments General comments (skin integrity, edema, etc.): Pt pale and with nausea with activity.  BP and all other VSS at EOB and in recliner.   Nausea mild - provided ginger ale, emesis bag, crackers - eduated pt to call RN decides she wants nausea meds    Exercises     Assessment/Plan    PT Assessment Patient needs continued PT services  PT Problem List Decreased strength;Decreased range of motion;Decreased activity tolerance;Decreased balance;Decreased mobility;Decreased knowledge of precautions;Decreased knowledge of use of DME       PT Treatment Interventions DME instruction;Therapeutic exercise;Gait training;Stair training;Functional mobility training;Therapeutic activities;Balance training;Modalities;Patient/family education    PT Goals (Current goals can be found in the Care Plan section)  Acute Rehab PT Goals Patient Stated Goal: return home PT Goal Formulation: With patient/family Time For Goal Achievement: 07/14/24 Potential to Achieve Goals: Good    Frequency 7X/week     Co-evaluation               AM-PAC PT 6 Clicks Mobility  Outcome Measure Help needed turning from your back to your side while in a flat bed without using bedrails?: A Little Help needed moving from lying on your back to sitting  on the side of a flat bed without using bedrails?: A Little Help needed moving to and from a bed to a chair (including a wheelchair)?: A Little Help needed standing up from a chair using your arms (e.g., wheelchair or bedside chair)?: A Little Help needed to walk in hospital room?: A Little Help needed climbing 3-5 steps with a railing? : A Little 6 Click Score: 18    End of Session Equipment Utilized During Treatment: Gait belt Activity Tolerance: Treatment limited secondary to medical complications (Comment) Patient left: in chair;with call bell/phone within reach;with family/visitor present (easy push button in chair alarm outlet; family present; pt aware to call to get up) Nurse Communication: Mobility status PT Visit Diagnosis: Other abnormalities of gait and mobility (R26.89);Muscle weakness (generalized) (M62.81)    Time: 1535-1600 PT Time Calculation (min) (ACUTE ONLY): 25 min   Charges:   PT Evaluation $PT Eval Low Complexity: 1 Low PT Treatments $Gait Training: 8-22 mins PT General Charges $$ ACUTE PT VISIT: 1 Visit         Brilee Port, PT Acute  Rehab Services [facility] [phone]   Benjiman VEAR Mulberry 06/30/2024, 4:18 PM

## 2024-06-30 NOTE — Progress Notes (Signed)
 Orthopedic Tech Progress Note Patient Details:  Katherine Larson [birth year] [id number]  Ortho Devices Type of Ortho Device: Bone foam zero knee Ortho Device/Splint Interventions: Ordered, Application   Post Interventions Patient Tolerated: Well  [name] 06/30/2024, 1:47 PM

## 2024-06-30 NOTE — Op Note (Signed)
 PATIENT ID:      Katherine Larson  MRN:     [id number] DOB/AGE:    01/05/1946 / 78 y.o.       OPERATIVE REPORT   DATE OF PROCEDURE:  06/30/2024      PREOPERATIVE DIAGNOSIS:   RIGHT KNEE OSTEOARTHRITIS      Estimated body mass index is 23.09 kg/m as calculated from the following:   Height as of this encounter: 5' 2.5 (1.588 m).   Weight as of this encounter: 58.2 kg.                                                       POSTOPERATIVE DIAGNOSIS:   Same                                                                  PROCEDURE:  Procedure(s): ARTHROPLASTY, KNEE, TOTAL Using DepuyAttune RP implants #5N Femur, #4Tibia, 8 mm Attune RP bearing, 35 Patella    SURGEON: Norleen LITTIE Gavel  ASSISTANT:   Eva Hasten RNFA (Present and scrubbed throughout the case, critical for assistance with exposure, retraction, instrumentation, and closure.)        ANESTHESIA: general, 20cc Exparel , 50cc 0.25% Marcaine  EBL: 100 cc FLUID REPLACEMENT: unk cc crystaloid TOURNIQUET: none DRAINS: None TRANEXAMIC ACID : 1gm IV, 2gm topical COMPLICATIONS:  None         INDICATIONS FOR PROCEDURE: The patient has  RIGHT KNEE OSTEOARTHRITIS, severe valgus deformities, XR shows bone on bone arthritis, lateral subluxation of tibia. Patient has failed all conservative measures including anti-inflammatory medicines, narcotics, attempts at exercise and weight loss, cortisone injections and viscosupplementation.  Risks and benefits of surgery have been discussed, questions answered.   DESCRIPTION OF PROCEDURE: The patient identified by armband, received  IV antibiotics, in the holding area at [facility]. Patient taken to the operating room, appropriate anesthetic monitors were attached, and general anesthesia was  induced. IV Tranexamic acid  was given. Lateral post and 2 surefoot positioners applied to the table, the lower extremity was then prepped and draped in usual sterile fashion from the toes to the high  thigh. Time-out procedure was performed. Eva Hasten, was present and scrubbed throughout the case, critical for assistance with, positioning, exposure, retraction, instrumentation, and closure.The skin and subcutaneous tissue along the incision was injected with 20 cc of a mixture of 20cc Exparel  and 30cc Marcaine  50cc saline solution, using a 21-gauge by 1-1/2 inch needle. We began the operation, with the knee flexed 130 degrees, by making the anterior midline incision starting at handbreadth above the patella going over the patella 1 cm medial to and 4 cm distal to the tibial tubercle. Small bleeders in the skin and the subcutaneous tissue identified and cauterized. Transverse retinaculum was incised and reflected medially and a medial parapatellar arthrotomy was accomplished. the patella was everted and theprepatellar fat pad resected. The superficial medial collateral ligament was then elevated from anterior to posterior along the proximal flare of the tibia and anterior half of the menisci resected. The knee was hyperflexed exposing bone on bone arthritis. Peripheral and notch osteophytes  as well as the cruciate ligaments were then resected. We continued to work our way around posteriorly along the proximal tibia, and externally rotated the tibia subluxing it out from underneath the femur. A McHale PCL retractor was placed through the notch, a lateral Hohmann retractor, and anterolateral small homan retractor placed. We then entered the proximal tibia with the Depuy starter drill in line with the axis of the tibia followed by an intramedullary guide rod and 3 degree posterior slope cutting guide. The tibial cutting guide, was pinned into place allowing resection of 10 mm of bone medially and 2 mm of bone laterally. Satisfied with the tibial resection, we then entered the distal femur 2 mm anterior to the PCL origin with the starter drill, followed by the intramedullary guide rod and applied the distal  femoral cutting guide set at 9 mm, with 5 degrees of valgus. This was pinned along the epicondylar axis. At this point, the distal femoral cut was accomplished without difficulty. We then sized for a #5N femoral component and pinned the chamfer guide in 3 degrees of external rotation. The anterior, posterior, and chamfer cuts were accomplished without difficulty followed by the Attune RP box cutting guide and the box cut. We also removed posterior osteophytes from the posterior femoral condyles. The posterior capsule was injected with Exparel  solution. The knee was brought into full extension. We checked our extension gap and fit a 8 mm trial lollipop. Distracting in extension with a lamina spreader,  bleeders in the posterior capsule, Posterior medial and posterior lateral gutter were cauterized.  The transexamic acid-soaked sponge was then placed in the gap of the knee in extension. The knee was flexed 30. The posterior patella cut was accomplished freehand, sized for a 35 mm dome, and the fixation pegs drilled.The knee was then once again hyperflexed exposing the proximal tibia. We sized for a # 4 tibial base plate, applied the smokestack and the conical reamer followed by the the Delta fin keel punch. We then hammered into place the Attune RP trial femoral component, drilled the lugs, inserted a  8 mm trial bearing, trial patellar button, and took the knee through range of motion from 0-130 degrees. Medial and lateral ligamentous stability was checked. No thumb pressure was required for patellar Tracking.  All trial components were removed, mating surfaces irrigated with pulse lavage, and dried with suction and sponges. Exparel  solution was applied to the cancellus bone of the patella distal femur and proximal tibia.  After waiting 30 seconds, the bony surfaces were again, dried with sponges. A double batch of DePuy HV cement was mixed and applied to all bony metallic mating surfaces except for the posterior  condyles of the femur itself. In order, we hammered into place the tibial tray and removed excess cement, the femoral component and removed excess cement. The final Attune RP bearing was inserted, and the knee brought to full extension with compression. The patellar button was clamped into place, and excess cement removed. The knee was held at 30 flexion with compression using the second surefoot, while the cement cured. The wound was irrigated out with normal saline solution pulse lavage. The rest of the Exparel  was injected into the parapatellar arthrotomy, subcutaneous tissues, and periosteal tissues. The parapatellar arthrotomy was closed with running #1 Vicryl suture. The subcutaneous tissue with 3-0 undyed Vicryl suture, and the skin with running 3-0 SQ vicryl. An Aquacil dressing and Ace wrap were applied. The patient was taken to recovery room without difficulty.  Norleen LITTIE Gavel 06/30/2024, 11:17 AM

## 2024-06-30 NOTE — Anesthesia Procedure Notes (Signed)
 Anesthesia Regional Block: Adductor canal block   Pre-Anesthetic Checklist: , timeout performed,  Correct Patient, Correct Site, Correct Laterality,  Correct Procedure, Correct Position, site marked,  Risks and benefits discussed,  Pre-op  evaluation,  At surgeon's request and post-op pain management  Laterality: Right  Prep: Maximum Sterile Barrier Precautions used, chloraprep       Needles:  Injection technique: Single-shot  Needle Type: Echogenic Stimulator Needle     Needle Length: 9cm  Needle Gauge: 21     Additional Needles:   Procedures:,,,, ultrasound used (permanent image in chart),,    Narrative:  Start time: 06/30/2024 8:59 AM End time: 06/30/2024 9:04 AM Injection made incrementally with aspirations every 5 mL.  Performed by: Personally  Anesthesiologist: Epifanio Fallow, MD

## 2024-06-30 NOTE — Discharge Instructions (Addendum)
INSTRUCTIONS AFTER JOINT REPLACEMENT   Remove items at home which could result in a fall. This includes throw rugs or furniture in walking pathways ICE to the affected joint every three hours while awake for 30 minutes at a time, for at least the first 3-5 days, and then as needed for pain and swelling.  Continue to use ice for pain and swelling. You may notice swelling that will progress down to the foot and ankle.  This is normal after surgery.  Elevate your leg when you are not up walking on it.   Continue to use the breathing machine you got in the hospital (incentive spirometer) which will help keep your temperature down.  It is common for your temperature to cycle up and down following surgery, especially at night when you are not up moving around and exerting yourself.  The breathing machine keeps your lungs expanded and your temperature down.   DIET:  As you were doing prior to hospitalization, we recommend a well-balanced diet.  DRESSING / WOUND CARE / SHOWERING  Keep the surgical dressing until follow up.  The dressing is water proof, so you can shower without any extra covering.  IF THE DRESSING FALLS OFF or the wound gets wet inside, change the dressing with sterile gauze.  Please use good hand washing techniques before changing the dressing.  Do not use any lotions or creams on the incision until instructed by your surgeon.    ACTIVITY  Increase activity slowly as tolerated, but follow the weight bearing instructions below.   No driving for 6 weeks or until further direction given by your physician.  You cannot drive while taking narcotics.  No lifting or carrying greater than 10 lbs. until further directed by your surgeon. Avoid periods of inactivity such as sitting longer than an hour when not asleep. This helps prevent blood clots.  You may return to work once you are authorized by your doctor.     WEIGHT BEARING   Weight bearing as tolerated with assist device (walker, cane,  etc) as directed, use it as long as suggested by your surgeon or therapist, typically at least 4-6 weeks.   EXERCISES  Results after joint replacement surgery are often greatly improved when you follow the exercise, range of motion and muscle strengthening exercises prescribed by your doctor. Safety measures are also important to protect the joint from further injury. Any time any of these exercises cause you to have increased pain or swelling, decrease what you are doing until you are comfortable again and then slowly increase them. If you have problems or questions, call your caregiver or physical therapist for advice.   Rehabilitation is important following a joint replacement. After just a few days of immobilization, the muscles of the leg can become weakened and shrink (atrophy).  These exercises are designed to build up the tone and strength of the thigh and leg muscles and to improve motion. Often times heat used for twenty to thirty minutes before working out will loosen up your tissues and help with improving the range of motion but do not use heat for the first two weeks following surgery (sometimes heat can increase post-operative swelling).   These exercises can be done on a training (exercise) mat, on the floor, on a table or on a bed. Use whatever works the best and is most comfortable for you.    Use music or television while you are exercising so that the exercises are a pleasant break in your   day. This will make your life better with the exercises acting as a break in your routine that you can look forward to.   Perform all exercises about fifteen times, three times per day or as directed.  You should exercise both the operative leg and the other leg as well.  Exercises include:   Quad Sets - Tighten up the muscle on the front of the thigh (Quad) and hold for 5-10 seconds.   Straight Leg Raises - With your knee straight (if you were given a brace, keep it on), lift the leg to 60  degrees, hold for 3 seconds, and slowly lower the leg.  Perform this exercise against resistance later as your leg gets stronger.  Leg Slides: Lying on your back, slowly slide your foot toward your buttocks, bending your knee up off the floor (only go as far as is comfortable). Then slowly slide your foot back down until your leg is flat on the floor again.  Angel Wings: Lying on your back spread your legs to the side as far apart as you can without causing discomfort.  Hamstring Strength:  Lying on your back, push your heel against the floor with your leg straight by tightening up the muscles of your buttocks.  Repeat, but this time bend your knee to a comfortable angle, and push your heel against the floor.  You may put a pillow under the heel to make it more comfortable if necessary.   A rehabilitation program following joint replacement surgery can speed recovery and prevent re-injury in the future due to weakened muscles. Contact your doctor or a physical therapist for more information on knee rehabilitation.    CONSTIPATION  Constipation is defined medically as fewer than three stools per week and severe constipation as less than one stool per week.  Even if you have a regular bowel pattern at home, your normal regimen is likely to be disrupted due to multiple reasons following surgery.  Combination of anesthesia, postoperative narcotics, change in appetite and fluid intake all can affect your bowels.   YOU MUST use at least one of the following options; they are listed in order of increasing strength to get the job done.  They are all available over the counter, and you may need to use some, POSSIBLY even all of these options:    Drink plenty of fluids (prune juice may be helpful) and high fiber foods Colace 100 mg by mouth twice a day  Senokot for constipation as directed and as needed Dulcolax (bisacodyl), take with full glass of water  Miralax (polyethylene glycol) once or twice a day as  needed.  If you have tried all these things and are unable to have a bowel movement in the first 3-4 days after surgery call either your surgeon or your primary doctor.    If you experience loose stools or diarrhea, hold the medications until you stool forms back up.  If your symptoms do not get better within 1 week or if they get worse, check with your doctor.  If you experience "the worst abdominal pain ever" or develop nausea or vomiting, please contact the office immediately for further recommendations for treatment.   ITCHING:  If you experience itching with your medications, try taking only a single pain pill, or even half a pain pill at a time.  You can also use Benadryl over the counter for itching or also to help with sleep.   TED HOSE STOCKINGS:  Use stockings on both   legs until for at least 2 weeks or as directed by physician office. They may be removed at night for sleeping.  MEDICATIONS:  See your medication summary on the "After Visit Summary" that nursing will review with you.  You may have some home medications which will be placed on hold until you complete the course of blood thinner medication.  It is important for you to complete the blood thinner medication as prescribed.  PRECAUTIONS:  If you experience chest pain or shortness of breath - call 911 immediately for transfer to the hospital emergency department.   If you develop a fever greater that 101 F, purulent drainage from wound, increased redness or drainage from wound, foul odor from the wound/dressing, or calf pain - CONTACT YOUR SURGEON.                                                   FOLLOW-UP APPOINTMENTS:  If you do not already have a post-op appointment, please call the office for an appointment to be seen by your surgeon.  Guidelines for how soon to be seen are listed in your "After Visit Summary", but are typically between 1-4 weeks after surgery.  OTHER INSTRUCTIONS:   Knee Replacement:  Do not place pillow  under knee, focus on keeping the knee straight while resting. CPM instructions: 0-90 degrees, 2 hours in the morning, 2 hours in the afternoon, and 2 hours in the evening. Place foam block, curve side up under heel at all times except when in CPM or when walking.  DO NOT modify, tear, cut, or change the foam block in any way.  POST-OPERATIVE OPIOID TAPER INSTRUCTIONS: It is important to wean off of your opioid medication as soon as possible. If you do not need pain medication after your surgery it is ok to stop day one. Opioids include: Codeine, Hydrocodone(Norco, Vicodin), Oxycodone(Percocet, oxycontin) and hydromorphone amongst others.  Long term and even short term use of opiods can cause: Increased pain response Dependence Constipation Depression Respiratory depression And more.  Withdrawal symptoms can include Flu like symptoms Nausea, vomiting And more Techniques to manage these symptoms Hydrate well Eat regular healthy meals Stay active Use relaxation techniques(deep breathing, meditating, yoga) Do Not substitute Alcohol to help with tapering If you have been on opioids for less than two weeks and do not have pain than it is ok to stop all together.  Plan to wean off of opioids This plan should start within one week post op of your joint replacement. Maintain the same interval or time between taking each dose and first decrease the dose.  Cut the total daily intake of opioids by one tablet each day Next start to increase the time between doses. The last dose that should be eliminated is the evening dose.   MAKE SURE YOU:  Understand these instructions.  Get help right away if you are not doing well or get worse.    Thank you for letting us be a part of your medical care team.  It is a privilege we respect greatly.  We hope these instructions will help you stay on track for a fast and full recovery!     Information on my medicine - ELIQUIS (apixaban)  This medication  education was reviewed with me or my healthcare representative as part of my discharge preparation.  Why was  Eliquis prescribed for you? Eliquis was prescribed for you to reduce the risk of blood clots forming after orthopedic surgery.    What do You need to know about Eliquis? Take your Eliquis TWICE DAILY - one tablet in the morning and one tablet in the evening with or without food.  It would be best to take the dose about the same time each day.  If you have difficulty swallowing the tablet whole please discuss with your pharmacist how to take the medication safely.  Take Eliquis exactly as prescribed by your doctor and DO NOT stop taking Eliquis without talking to the doctor who prescribed the medication.  Stopping without other medication to take the place of Eliquis may increase your risk of developing a clot.  After discharge, you should have regular check-up appointments with your healthcare provider that is prescribing your Eliquis.  What do you do if you miss a dose? If a dose of ELIQUIS is not taken at the scheduled time, take it as soon as possible on the same day and twice-daily administration should be resumed.  The dose should not be doubled to make up for a missed dose.  Do not take more than one tablet of ELIQUIS at the same time.  Important Safety Information A possible side effect of Eliquis is bleeding. You should call your healthcare provider right away if you experience any of the following: Bleeding from an injury or your nose that does not stop. Unusual colored urine (red or dark brown) or unusual colored stools (red or black). Unusual bruising for unknown reasons. A serious fall or if you hit your head (even if there is no bleeding).  Some medicines may interact with Eliquis and might increase your risk of bleeding or clotting while on Eliquis. To help avoid this, consult your healthcare provider or pharmacist prior to using any new prescription or  non-prescription medications, including herbals, vitamins, non-steroidal anti-inflammatory drugs (NSAIDs) and supplements.  This website has more information on Eliquis (apixaban): http://www.eliquis.com/eliquis/home

## 2024-06-30 NOTE — Telephone Encounter (Signed)
 Patient Product/process development scientist completed.    The patient is insured through [facility]. Patient has Medicare and is not eligible for a copay card, but may be able to apply for patient assistance or Medicare RX Payment Plan (Patient Must reach out to their plan, if eligible for payment plan), if available.    Ran test claim for Eliquis  5 mg and the current 30 day co-pay is $0.00.   This test claim was processed through [facility]- copay amounts may vary at other pharmacies due to pharmacy/plan contracts, or as the patient moves through the different stages of their insurance plan.     Reyes Sharps, CPHT Pharmacy Technician III Certified Patient Advocate [facility] Patient Advocate Team Direct Number: [phone]  Fax: [phone]

## 2024-06-30 NOTE — Interval H&P Note (Signed)
 History and Physical Interval Note:  06/30/2024 8:52 AM  Katherine Larson  has presented today for surgery, with the diagnosis of RIGHT KNEE OSTEOARTHRITIS.  The various methods of treatment have been discussed with the patient and family. After consideration of risks, benefits and other options for treatment, the patient has consented to  Procedure(s) with comments: ARTHROPLASTY, KNEE, TOTAL (Right) - RIGHT TOTAL KNEE ARTHROPLASTY as a surgical intervention.  The patient's history has been reviewed, patient examined, no change in status, stable for surgery.  I have reviewed the patient's chart and labs.  Questions were answered to the patient's satisfaction.     Norleen LITTIE Gavel

## 2024-06-30 NOTE — Anesthesia Procedure Notes (Signed)
 Procedure Name: Intubation Date/Time: 06/30/2024 10:00 AM  Performed by: Nada Corean CROME, CRNAPre-anesthesia Checklist: Suction available, Emergency Drugs available, Patient identified, Timeout performed and Patient being monitored Patient Re-evaluated:Patient Re-evaluated prior to induction Oxygen  Delivery Method: Circle system utilized Preoxygenation: Pre-oxygenation with 100% oxygen  Induction Type: IV induction Ventilation: Mask ventilation without difficulty and Oral airway inserted - appropriate to patient size Laryngoscope Size: Mac and 3 Grade View: Grade I Tube type: Oral Tube size: 6.5 mm Airway Equipment and Method: Stylet Placement Confirmation: ETT inserted through vocal cords under direct vision, positive ETCO2 and breath sounds checked- equal and bilateral Secured at: 19 cm Tube secured with: Tape Dental Injury: Teeth and Oropharynx as per pre-operative assessment

## 2024-06-30 NOTE — Plan of Care (Signed)
  Problem: Education: Goal: Knowledge of General Education information will improve Description: Including pain rating scale, medication(s)/side effects and non-pharmacologic comfort measures Outcome: Progressing   Problem: Health Behavior/Discharge Planning: Goal: Ability to manage health-related needs will improve Outcome: Progressing   Problem: Clinical Measurements: Goal: Ability to maintain clinical measurements within normal limits will improve Outcome: Progressing Goal: Will remain free from infection Outcome: Progressing Goal: Diagnostic test results will improve Outcome: Progressing Goal: Respiratory complications will improve Outcome: Progressing Goal: Cardiovascular complication will be avoided Outcome: Progressing   Problem: Activity: Goal: Risk for activity intolerance will decrease Outcome: Progressing   Problem: Nutrition: Goal: Adequate nutrition will be maintained Outcome: Completed/Met   Problem: Coping: Goal: Level of anxiety will decrease Outcome: Progressing   Problem: Elimination: Goal: Will not experience complications related to bowel motility Outcome: Progressing Goal: Will not experience complications related to urinary retention Outcome: Completed/Met   Problem: Pain Managment: Goal: General experience of comfort will improve and/or be controlled Outcome: Progressing   Problem: Safety: Goal: Ability to remain free from injury will improve Outcome: Progressing   Problem: Skin Integrity: Goal: Risk for impaired skin integrity will decrease Outcome: Progressing   Problem: Skin Integrity: Goal: Risk for impaired skin integrity will decrease Outcome: Adequate for Discharge   Problem: Tissue Perfusion: Goal: Adequacy of tissue perfusion will improve Outcome: Adequate for Discharge   Problem: Education: Goal: Knowledge of the prescribed therapeutic regimen will improve Outcome: Progressing Goal: Individualized Educational  Video(s) Outcome: Completed/Met   Problem: Activity: Goal: Ability to avoid complications of mobility impairment will improve Outcome: Progressing Goal: Range of joint motion will improve Outcome: Adequate for Discharge   Problem: Clinical Measurements: Goal: Postoperative complications will be avoided or minimized Outcome: Progressing   Problem: Pain Management: Goal: Pain level will decrease with appropriate interventions Outcome: Progressing   Problem: Skin Integrity: Goal: Will show signs of wound healing Outcome: Progressing

## 2024-06-30 NOTE — Transfer of Care (Signed)
 Immediate Anesthesia Transfer of Care Note  Patient: Katherine Larson  Procedure(s) Performed: ARTHROPLASTY, KNEE, TOTAL (Right: Knee)  Patient Location: PACU  Anesthesia Type:General and Regional  Level of Consciousness: awake, alert , oriented, and patient cooperative  Airway & Oxygen  Therapy: Patient Spontanous Breathing and Patient connected to face mask oxygen   Post-op Assessment: Report given to RN and Post -op Vital signs reviewed and stable  Post vital signs: Reviewed and stable  Last Vitals:  Vitals Value Taken Time  BP 124/62 06/30/24 11:50  Temp    Pulse 79 06/30/24 11:53  Resp 14 06/30/24 11:53  SpO2 100 % 06/30/24 11:53  Vitals shown include unfiled device data.  Last Pain:  Vitals:   06/30/24 0814  TempSrc:   PainSc: 5       Patients Stated Pain Goal: 4 (06/30/24 9185)  Complications: No notable events documented.

## 2024-06-30 NOTE — Anesthesia Postprocedure Evaluation (Signed)
 Anesthesia Post Note  Patient: Inocente ONEIDA Conrad  Procedure(s) Performed: ARTHROPLASTY, KNEE, TOTAL (Right: Knee)     Patient location during evaluation: PACU Anesthesia Type: Regional and General Level of consciousness: awake and alert Pain management: pain level controlled Vital Signs Assessment: post-procedure vital signs reviewed and stable Respiratory status: spontaneous breathing, nonlabored ventilation and respiratory function stable Cardiovascular status: blood pressure returned to baseline and stable Postop Assessment: no apparent nausea or vomiting Anesthetic complications: no   No notable events documented.  Last Vitals:  Vitals:   06/30/24 1245 06/30/24 1300  BP: (!) 149/54 (!) 144/52  Pulse: 73 71  Resp: 12 19  Temp:    SpO2: 94% 95%    Last Pain:  Vitals:   06/30/24 1300  TempSrc:   PainSc: 0-No pain                 Zakarie Sturdivant,W. EDMOND

## 2024-07-01 ENCOUNTER — Encounter (HOSPITAL_COMMUNITY): Payer: Self-pay | Admitting: Orthopedic Surgery

## 2024-07-01 DIAGNOSIS — I130 Hypertensive heart and chronic kidney disease with heart failure and stage 1 through stage 4 chronic kidney disease, or unspecified chronic kidney disease: Secondary | ICD-10-CM | POA: Diagnosis not present

## 2024-07-01 DIAGNOSIS — J449 Chronic obstructive pulmonary disease, unspecified: Secondary | ICD-10-CM | POA: Diagnosis not present

## 2024-07-01 DIAGNOSIS — M1711 Unilateral primary osteoarthritis, right knee: Secondary | ICD-10-CM | POA: Diagnosis not present

## 2024-07-01 DIAGNOSIS — E785 Hyperlipidemia, unspecified: Secondary | ICD-10-CM | POA: Diagnosis not present

## 2024-07-01 DIAGNOSIS — D631 Anemia in chronic kidney disease: Secondary | ICD-10-CM | POA: Diagnosis not present

## 2024-07-01 DIAGNOSIS — I252 Old myocardial infarction: Secondary | ICD-10-CM | POA: Diagnosis not present

## 2024-07-01 DIAGNOSIS — N189 Chronic kidney disease, unspecified: Secondary | ICD-10-CM | POA: Diagnosis not present

## 2024-07-01 DIAGNOSIS — I2510 Atherosclerotic heart disease of native coronary artery without angina pectoris: Secondary | ICD-10-CM | POA: Diagnosis not present

## 2024-07-01 DIAGNOSIS — I5022 Chronic systolic (congestive) heart failure: Secondary | ICD-10-CM | POA: Diagnosis not present

## 2024-07-01 DIAGNOSIS — E1122 Type 2 diabetes mellitus with diabetic chronic kidney disease: Secondary | ICD-10-CM | POA: Diagnosis not present

## 2024-07-01 DIAGNOSIS — Z95810 Presence of automatic (implantable) cardiac defibrillator: Secondary | ICD-10-CM | POA: Diagnosis not present

## 2024-07-01 DIAGNOSIS — Z7984 Long term (current) use of oral hypoglycemic drugs: Secondary | ICD-10-CM | POA: Diagnosis not present

## 2024-07-01 DIAGNOSIS — K219 Gastro-esophageal reflux disease without esophagitis: Secondary | ICD-10-CM | POA: Diagnosis not present

## 2024-07-01 LAB — GLUCOSE, CAPILLARY
Glucose-Capillary: 175 mg/dL — ABNORMAL HIGH (ref 70–99)
Glucose-Capillary: 157 mg/dL — ABNORMAL HIGH (ref 70–99)

## 2024-07-01 MED ORDER — OXYCODONE HCL 5 MG PO TABS: 5.0000 mg | ORAL_TABLET | ORAL | 0 refills | Status: DC | PRN

## 2024-07-01 MED ORDER — DOCUSATE SODIUM 100 MG PO CAPS: 100.0000 mg | ORAL_CAPSULE | Freq: Two times a day (BID) | ORAL | 2 refills | Status: AC

## 2024-07-01 MED ORDER — ASPIRIN 325 MG PO TBEC: 325.0000 mg | DELAYED_RELEASE_TABLET | Freq: Two times a day (BID) | ORAL | 0 refills | Status: AC

## 2024-07-01 MED ORDER — TIZANIDINE HCL 2 MG PO TABS: 2.0000 mg | ORAL_TABLET | Freq: Four times a day (QID) | ORAL | 0 refills | Status: AC | PRN

## 2024-07-01 NOTE — TOC Transition Note (Signed)
 Transition of Care [facility]) - Discharge Note   Patient Details  Name: Katherine Larson MRN: [id number] Date of Birth: Apr 10, 1946  Transition of Care [facility]) CM/SW Contact:  [name], RN Phone Number: 07/01/2024, 10:46 AM   Clinical Narrative:   Met with patient at bedside to review dc therapy and home equipment needs, pt confirmed [facility] PT w/[facility] for a few visits, then OPPT at [facility], reports she has a RW, no home equipment needs. No TOC need.s     Final next level of care: Home w Home Health Services     Patient Goals and CMS Choice Patient states their goals for this hospitalization and ongoing recovery are:: return home          Discharge Placement                       Discharge Plan and Services Additional resources added to the After Visit Summary for                            [facility] Arranged: PT HH Agency: [facility] ([facility])        Social Drivers of Health (SDOH) Interventions SDOH Screenings   Food Insecurity: No Food Insecurity (06/30/2024)  Housing: Low Risk  (06/30/2024)  Transportation Needs: No Transportation Needs (06/30/2024)  Utilities: Not At Risk (06/30/2024)  Depression (PHQ2-9): Low Risk  (06/16/2024)  Social Connections: Moderately Isolated (06/30/2024)  Tobacco Use: Medium Risk (06/30/2024)     Readmission Risk Interventions     No data to display

## 2024-07-01 NOTE — Progress Notes (Signed)
 Subjective: 1 Day Post-Op Procedure(s) (LRB): ARTHROPLASTY, KNEE, TOTAL (Right) Patient reports pain as moderate.    Objective: Vital signs in last 24 hours: Temp:  [98 F (36.7 C)-99.2 F (37.3 C)] 99 F (37.2 C) (08/12 9361) Pulse Rate:  [69-89] 76 (08/12 0638) Resp:  [11-21] 18 (08/12 9361) BP: (113-165)/(42-95) 130/42 (08/12 0638) SpO2:  [94 %-100 %] 100 % (08/12 9361) Weight:  [58.2 kg] 58.2 kg (08/11 0814)  Intake/Output from previous day: 08/11 0701 - 08/12 0700 In: 2696.5 [P.O.:720; I.V.:1576.5; IV Piggyback:400] Out: 251 [Urine:1; Blood:250] Intake/Output this shift: No intake/output data recorded.  No results for input(s): HGB in the last 72 hours. No results for input(s): WBC, RBC, HCT, PLT in the last 72 hours. No results for input(s): NA, K, CL, CO2, BUN, CREATININE, GLUCOSE, CALCIUM  in the last 72 hours. No results for input(s): LABPT, INR in the last 72 hours.  Neurologically intact ABD soft Neurovascular intact Sensation intact distally Intact pulses distally Dorsiflexion/Plantar flexion intact No cellulitis present Compartment soft   Assessment/Plan: 1 Day Post-Op Procedure(s) (LRB): ARTHROPLASTY, KNEE, TOTAL (Right) Advance diet Up with therapy Discharge later today begin op pt later this week.r   Patient's anticipated LOS is less than 2 midnights, meeting these requirements: - Younger than 24 - Lives within 1 hour of care - Has a competent adult at home to recover with post-op recover - NO history of  - Chronic pain requiring opiods  - Diabetes  - Coronary Artery Disease  - Heart failure  - Heart attack  - Stroke  - DVT/VTE  - Cardiac arrhythmia  - Respiratory Failure/COPD  - Renal failure  - Anemia  - Advanced Liver disease     Norleen LITTIE Gavel 07/01/2024, 7:49 AM

## 2024-07-01 NOTE — Progress Notes (Signed)
 Physical Therapy Treatment Patient Details Name: Katherine Larson MRN: [id number] DOB: 03/01/1946 Today's Date: 07/01/2024   History of Present Illness Pt is 78 yo female admitted on 06/30/24 for R TKA.  Pt with hx including but not limited to anemia, CAD, COPD, GERD, back pain, HTN, DM2, HLD, AICD, CHF, MI, back surgery, spinal stimulator    PT Comments  Pt is POD # 1 and is progressing well.  She is ambulating 30' with RW and supervision and does not have stairs.  Pt has good support and DME at home.  Pt with excellent pain control, ROM , and quad activation.  Has HHPT scheduled. Pt demonstrates safe gait & transfers in order to return home from PT perspective once discharged by MD.  While in hospital, will continue to benefit from PT for skilled therapy to advance mobility and exercises.       If plan is discharge home, recommend the following: A little help with walking and/or transfers;A little help with bathing/dressing/bathroom;Assistance with cooking/housework;Help with stairs or ramp for entrance   Can travel by private vehicle        Equipment Recommendations  None recommended by PT    Recommendations for Other Services       Precautions / Restrictions Precautions Precautions: Fall;Knee Precaution/Restrictions Comments: Legally Blind Restrictions RLE Weight Bearing Per Provider Order: Weight bearing as tolerated     Mobility  Bed Mobility Overal bed mobility: Needs Assistance Bed Mobility: Supine to Sit     Supine to sit: Supervision          Transfers Overall transfer level: Needs assistance Equipment used: Rolling walker (2 wheels) Transfers: Sit to/from Stand Sit to Stand: Supervision           General transfer comment: good recall hand placement    Ambulation/Gait Ambulation/Gait assistance: Supervision Gait Distance (Feet): 75 Feet Assistive device: Rolling walker (2 wheels) Gait Pattern/deviations: Step-through pattern, Decreased weight shift  to right Gait velocity: functional     General Gait Details: Started CGA progressed supervision; steady balance; min cues for direction due to vision   Stairs Stairs:  (has ramp)           Wheelchair Mobility     Tilt Bed    Modified Rankin (Stroke Patients Only)       Balance Overall balance assessment: Needs assistance Sitting-balance support: No upper extremity supported Sitting balance-Leahy Scale: Good     Standing balance support: Bilateral upper extremity supported, No upper extremity supported Standing balance-Leahy Scale: Fair Standing balance comment: RW to ambulate; could static stand without support                            Communication    Cognition Arousal: Alert Behavior During Therapy: WFL for tasks assessed/performed   PT - Cognitive impairments: No apparent impairments                       PT - Cognition Comments: Family present        Cueing    Exercises Total Joint Exercises Ankle Circles/Pumps: AROM, Both, 10 reps, Supine Quad Sets: AROM, Both, 10 reps, Supine Heel Slides: AROM, Right, 10 reps, Supine Hip ABduction/ADduction: AROM, Right, 10 reps, Supine Long Arc Quad: AROM, Right, 10 reps, Supine Knee Flexion: AROM, Right, 10 reps, Supine Goniometric ROM: R knee 0 to 90 degrees    General Comments   Educated on safe ice use, no  pivots, car transfers, resting with leg straight, and TED hose during day. Also, encouraged walking every 1-2 hours during day. Educated on HEP with focus on mobility the first weeks. Discussed doing exercises within pain control and if pain increasing could decreased ROM, reps, and stop exercises as needed. Encouraged to perform quad sets and ankle pumps frequently for blood flow and to promote full knee extension.      Pertinent Vitals/Pain Pain Assessment Pain Assessment: 0-10 Pain Score: 4  Pain Location: R knee Pain Descriptors / Indicators: Discomfort Pain Intervention(s):  Limited activity within patient's tolerance, Monitored during session, Premedicated before session, Repositioned, Ice applied    Home Living                          Prior Function            PT Goals (current goals can now be found in the care plan section) Progress towards PT goals: Progressing toward goals    Frequency    7X/week      PT Plan      Co-evaluation              AM-PAC PT 6 Clicks Mobility   Outcome Measure  Help needed turning from your back to your side while in a flat bed without using bedrails?: A Little Help needed moving from lying on your back to sitting on the side of a flat bed without using bedrails?: A Little Help needed moving to and from a bed to a chair (including a wheelchair)?: A Little Help needed standing up from a chair using your arms (e.g., wheelchair or bedside chair)?: A Little Help needed to walk in hospital room?: A Little Help needed climbing 3-5 steps with a railing? : A Little 6 Click Score: 18    End of Session Equipment Utilized During Treatment: Gait belt Activity Tolerance: Patient tolerated treatment well Patient left: in chair;with call bell/phone within reach;with family/visitor present (knows to call; unable to do alarm due to soft touch call bell in place) Nurse Communication: Mobility status PT Visit Diagnosis: Other abnormalities of gait and mobility (R26.89);Muscle weakness (generalized) (M62.81)     Time: 1040-1105 PT Time Calculation (min) (ACUTE ONLY): 25 min  Charges:    $Gait Training: 8-22 mins $Therapeutic Exercise: 8-22 mins PT General Charges $$ ACUTE PT VISIT: 1 Visit                     Benjiman, PT Acute Rehab [facility] [phone]    Benjiman VEAR Mulberry 07/01/2024, 11:34 AM

## 2024-07-01 NOTE — Discharge Summary (Signed)
 Patient ID: Katherine Larson MRN: [id number] DOB/AGE: [birth year] 78 y.o.  Admit date: 06/30/2024 Discharge date: 07/01/2024  Admission Diagnoses:  Principal Problem:   Primary osteoarthritis of right knee Active Problems:   Arthritis of right knee   Discharge Diagnoses:  Same  Past Medical History:  Diagnosis Date   AICD (automatic cardioverter/defibrillator) present    Anemia    anemia of chronic disease +/- IDA followed by Dr. Gatha. received iron  infusions in the past   Borderline glaucoma    CAD (coronary artery disease)    a. s/p CABG 1996. b. low risk nuc 2011. c. LHC 04/2016 due to drop in EF -> occluded native LAD,  widely patent sequential LIMA-D1-LAD.   Chronic kidney disease    mild stage of kidney disease   Chronic systolic CHF (congestive heart failure) (HCC)    Complication of anesthesia    DM2 (diabetes mellitus, type 2) (HCC)    not on any medicine for this at this time, 05/2016   Dyspnea    with exertion   GERD (gastroesophageal reflux disease)    Gout    History of blood transfusion    History of hiatal hernia    in 20s   History of pneumonia    March, 2016   HLD (hyperlipidemia)    HTN (hypertension)    Inappropriate sinus tachycardia (HCC)    LBBB (left bundle branch block)    a. 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 Dx 04/2016 - EF 25-30%, diffuse HK, elevated LVEDP, mild MR, mod LAE.   Normocytic anemia 10/17/2021   NSVT (nonsustained ventricular tachycardia) (HCC)    PAT (paroxysmal atrial tachycardia) (HCC)    Pneumonia    PONV (postoperative nausea and vomiting)    after just about every surgery I've had   Rheumatoid arthritis (HCC)    RA- hands   Seasonal allergies     Surgeries: Procedure(s): ARTHROPLASTY, KNEE, TOTAL on 06/30/2024   Consultants:   Discharged Condition:  Improved  Hospital Course: Katherine Larson is an 78 y.o. female who was admitted 06/30/2024 for operative treatment ofPrimary osteoarthritis of right knee. Patient has severe unremitting pain that affects sleep, daily activities, and work/hobbies. After pre-op  clearance the patient was taken to the operating room on 06/30/2024 and underwent  Procedure(s): ARTHROPLASTY, KNEE, TOTAL.    Patient was given perioperative antibiotics:  Anti-infectives (From admission, onward)    Start     Dose/Rate Route Frequency Ordered Stop   06/30/24 0800  ceFAZolin  (ANCEF ) IVPB 2g/100 mL premix        2 g 200 mL/hr over 30 Minutes Intravenous On call to O.R. 06/30/24 0745 06/30/24 1014        Patient was given sequential compression devices, early ambulation, and chemoprophylaxis to prevent DVT.  Inpatient Morphine  Milligram Equivalents Per Day 8/11 - 8/12   Values displayed are in units of MME/Day    Order Start / End Date Yesterday Today    fentaNYL  (SUBLIMAZE ) injection 25-50 mcg 8/11 - 8/11 7.5 of 45-90 --    fentaNYL  (SUBLIMAZE ) injection 50-100 mcg 8/11 - 8/11 15 of 15-30 --    fentaNYL  (SUBLIMAZE ) injection 8/11 - 8/11 *30 of 30 --    oxyCODONE  (Oxy IR/ROXICODONE ) immediate release tablet 5-10 mg 8/11 - No end  date 30 of 22.5-45 30 of 45-90    HYDROmorphone  (DILAUDID ) injection 0.5-1 mg 8/11 - No end date 20 of 30-60 0 of 60-120    traMADol  (ULTRAM ) tablet 50 mg 8/11 - No end date 5 of 10 10 of 20    Daily Totals  * 107.5 of 152.5-265 40 of 125-230  *One-Step medication     Patient benefited maximally from hospital stay and there were no complications.    Recent vital signs: Patient Vitals for the past 24 hrs:  BP Temp Temp src Pulse Resp SpO2  07/01/24 0638 (!) 130/42 99 F (37.2 C) -- 76 18 100 %  07/01/24 0200 (!) 113/52 98.9 F (37.2 C) -- 75 17 98 %  06/30/24 2123 (!) 113/51 98.9 F (37.2 C) Oral 78 17 100 %  06/30/24 2011 -- -- -- -- -- 100 %  06/30/24 1738 (!) 133/57 -- -- 82 17  97 %  06/30/24 1538 (!) 163/60 99.2 F (37.3 C) Oral 89 17 99 %  06/30/24 1328 (!) 148/54 98.7 F (37.1 C) Oral 73 16 97 %  06/30/24 1315 (!) 148/54 98 F (36.7 C) -- 71 19 94 %  06/30/24 1300 (!) 144/52 -- -- 71 19 95 %  06/30/24 1245 (!) 149/54 -- -- 73 12 94 %  06/30/24 1215 (!) 141/60 -- -- 81 12 94 %  06/30/24 1200 (!) 140/60 -- -- 81 14 100 %  06/30/24 1154 (!) 124/95 98.3 F (36.8 C) -- 78 13 100 %  06/30/24 0917 (!) 165/54 -- -- 73 16 98 %  06/30/24 0912 -- -- -- 70 13 98 %  06/30/24 0909 (!) 142/61 -- -- 69 11 95 %  06/30/24 0907 127/68 -- -- 70 (!) 21 95 %     Recent laboratory studies: No results for input(s): WBC, HGB, HCT, PLT, NA, K, CL, CO2, BUN, CREATININE, GLUCOSE, INR, CALCIUM  in the last 72 hours.  Invalid input(s): PT, 2   Discharge Medications:   Allergies as of 07/01/2024       Reactions   Biaxin [clarithromycin] Rash, Other (See Comments)   Blisters in mouth   Penicillins Rash, Other (See Comments)   Blisters in mouth Has patient had a PCN reaction causing immediate rash, facial/tongue/throat swelling, SOB or lightheadedness with hypotension: Yesyes Has patient had a PCN reaction causing severe rash involving mucus membranes or skin necrosis: Nono Has patient had a PCN reaction that required hospitalization Nono Has patient had a PCN reaction occurring within the last 10 years: Nono If all of the above answers are NO, then may proceed   Hydroxychloroquine  Other (See Comments)   Sulfamethoxazole    Other Reaction(s): Other (See Comments)   Sulfasalazine Other (See Comments)   Losartan  Potassium    Other reaction(s): elevated creatinine        Medication List     TAKE these medications    albuterol  108 (90 Base) MCG/ACT inhaler Commonly known as: VENTOLIN  HFA Inhale 1-2 puffs into the lungs every 6 (six) hours as needed for wheezing or shortness of breath.   allopurinol  300 MG tablet Commonly known as:  ZYLOPRIM  Take 300 mg by mouth daily.   aspirin  EC 325 MG tablet Take 1 tablet (325 mg total) by mouth 2 (two) times daily. What changed:  medication strength how much to take when to take this   atorvastatin  80 MG tablet Commonly known as: LIPITOR  Take 80 mg by mouth daily.   carvedilol  6.25 MG tablet  Commonly known as: COREG  Take 1 tablet (6.25 mg total) by mouth 2 (two) times daily with a meal.   cholecalciferol 25 MCG (1000 UNIT) tablet Commonly known as: VITAMIN D3 Take 1,000 Units by mouth daily.   docusate sodium  100 MG capsule Commonly known as: Colace Take 1 capsule (100 mg total) by mouth 2 (two) times daily.   Enbrel SureClick 50 MG/ML injection Generic drug: etanercept Inject 50 mg into the skin every Monday.   Farxiga  10 MG Tabs tablet Generic drug: dapagliflozin  propanediol TAKE 1 TABLET(10 MG) BY MOUTH DAILY BEFORE BREAKFAST   ferrous sulfate 325 (65 FE) MG tablet Take 325 mg by mouth daily with breakfast.   fexofenadine 180 MG tablet Commonly known as: ALLEGRA Take 180 mg by mouth daily.   fluticasone  50 MCG/ACT nasal spray Commonly known as: FLONASE  Place 1 spray into both nostrils daily.   furosemide  40 MG tablet Commonly known as: LASIX  Take 0.5 tablets (20 mg total) by mouth daily.   gabapentin  300 MG capsule Commonly known as: NEURONTIN  Take 300 mg by mouth 2 (two) times daily.   lansoprazole  30 MG capsule Commonly known as: PREVACID  Take 1 capsule (30 mg total) by mouth daily before breakfast. What changed: when to take this   leflunomide  10 MG tablet Commonly known as: ARAVA  Take 10 mg by mouth daily.   losartan  100 MG tablet Commonly known as: COZAAR  Take 1 tablet (100 mg total) by mouth daily. What changed: additional instructions   MAGnesium -Oxide 400 (240 Mg) MG tablet Generic drug: magnesium  oxide TAKE 1 TABLET(400 MG) BY MOUTH DAILY   meclizine  12.5 MG tablet Commonly known as: ANTIVERT  Take 12.5 mg by mouth 3  (three) times daily as needed for dizziness.   montelukast  10 MG tablet Commonly known as: SINGULAIR  Take 10 mg by mouth at bedtime.   nitroGLYCERIN  0.4 MG SL tablet Commonly known as: NITROSTAT  DISSOLVE 1 TABLET UNDER THE  TONGUE EVERY 5 MINUTES AS NEEDED FOR CHEST PAIN. MAX OF 3 TABLETS IN 15 MINUTES. CALL 911 IF PAIN  PERSISTS.   oxyCODONE  5 MG immediate release tablet Commonly known as: Roxicodone  Take 1 tablet (5 mg total) by mouth every 4 (four) hours as needed for severe pain (pain score 7-10).   oxyCODONE -acetaminophen  10-325 MG tablet Commonly known as: PERCOCET Take 1 tablet by mouth in the morning, at noon, in the evening, and at bedtime.   PreserVision AREDS 2 Caps Take 1 capsule by mouth 2 (two) times daily.   Repatha  SureClick 140 MG/ML Soaj Generic drug: Evolocumab  Inject 140 mg into the skin every 14 (fourteen) days.   sacubitril -valsartan  24-26 MG Commonly known as: ENTRESTO  Take 1 tablet by mouth 2 (two) times daily.   spironolactone  25 MG tablet Commonly known as: ALDACTONE  Take 1 tablet (25 mg total) by mouth daily. What changed: additional instructions   tiZANidine  2 MG tablet Commonly known as: ZANAFLEX  Take 1 tablet (2 mg total) by mouth every 6 (six) hours as needed.   Trelegy Ellipta 200-62.5-25 MCG/ACT Aepb Generic drug: Fluticasone -Umeclidin-Vilant Inhale 1 puff into the lungs daily.   Vascepa  1 g capsule Generic drug: icosapent  Ethyl TAKE 1 CAPSULE(1 GRAM) BY MOUTH TWICE DAILY   vitamin B-12 250 MCG tablet Commonly known as: CYANOCOBALAMIN  Take 250 mcg by mouth daily.               Durable Medical Equipment  (From admission, onward)           Start     Ordered  06/30/24 1334  DME Walker rolling  Once       Question:  Patient needs a walker to treat with the following condition  Answer:  Status post right knee replacement   06/30/24 1333   06/30/24 1334  DME 3 n 1  Once        06/30/24 1333               Discharge Care Instructions  (From admission, onward)           Start     Ordered   07/01/24 0000  Weight bearing as tolerated        07/01/24 0847            Diagnostic Studies: No results found.  Disposition: Discharge disposition: 01-Home or Self Care       Discharge Instructions     Call MD / Call 911   Complete by: As directed    If you experience chest pain or shortness of breath, CALL 911 and be transported to the hospital emergency room.  If you develope a fever above 101 F, pus (white drainage) or increased drainage or redness at the wound, or calf pain, call your surgeon's office.   Constipation Prevention   Complete by: As directed    Drink plenty of fluids.  Prune juice may be helpful.  You may use a stool softener, such as Colace (over the counter) 100 mg twice a day.  Use MiraLax  (over the counter) for constipation as needed.   Diet - low sodium heart healthy   Complete by: As directed    Driving restrictions   Complete by: As directed    No driving for 2 weeks   Increase activity slowly as tolerated   Complete by: As directed    Patient may shower   Complete by: As directed    You may shower without a dressing once there is no drainage.  Do not wash over the wound.  If drainage remains, cover wound with plastic wrap and then shower.   Post-operative opioid taper instructions:   Complete by: As directed    POST-OPERATIVE OPIOID TAPER INSTRUCTIONS: It is important to wean off of your opioid medication as soon as possible. If you do not need pain medication after your surgery it is ok to stop day one. Opioids include: Codeine, Hydrocodone (Norco, Vicodin), Oxycodone (Percocet, oxycontin ) and hydromorphone  amongst others.  Long term and even short term use of opiods can cause: Increased pain response Dependence Constipation Depression Respiratory depression And more.  Withdrawal symptoms can include Flu like symptoms Nausea, vomiting And  more Techniques to manage these symptoms Hydrate well Eat regular healthy meals Stay active Use relaxation techniques(deep breathing, meditating, yoga) Do Not substitute Alcohol  to help with tapering If you have been on opioids for less than two weeks and do not have pain than it is ok to stop all together.  Plan to wean off of opioids This plan should start within one week post op of your joint replacement. Maintain the same interval or time between taking each dose and first decrease the dose.  Cut the total daily intake of opioids by one tablet each day Next start to increase the time between doses. The last dose that should be eliminated is the evening dose.      Weight bearing as tolerated   Complete by: As directed         Follow-up Information     Yvone Rush, MD. Go on 07/08/2024.  Specialty: Orthopedic Surgery Why: your appointment is scheduled for 10:15. Contact information: [address] [place] [address] [phone]         [facility] Follow up.   Why: HHPT will provide 6 home visits prior to starting outpatient physical therapy        [facility]- [facility]. Go on 07/15/2024.   Why: your appointment has been scheduled they will call you with a time Contact information: [address]  [place], [place]   [phone]                 Signed: Camellia Ellen 07/01/2024, 8:48 AM

## 2024-07-01 NOTE — Progress Notes (Signed)
 Discharge instructions given to patient and family questions asked and answered D Rosevelt Constable RN

## 2024-07-02 DIAGNOSIS — I2510 Atherosclerotic heart disease of native coronary artery without angina pectoris: Secondary | ICD-10-CM | POA: Diagnosis not present

## 2024-07-02 DIAGNOSIS — E785 Hyperlipidemia, unspecified: Secondary | ICD-10-CM | POA: Diagnosis not present

## 2024-07-02 DIAGNOSIS — N189 Chronic kidney disease, unspecified: Secondary | ICD-10-CM | POA: Diagnosis not present

## 2024-07-02 DIAGNOSIS — M109 Gout, unspecified: Secondary | ICD-10-CM | POA: Diagnosis not present

## 2024-07-02 DIAGNOSIS — E1142 Type 2 diabetes mellitus with diabetic polyneuropathy: Secondary | ICD-10-CM | POA: Diagnosis not present

## 2024-07-02 DIAGNOSIS — M069 Rheumatoid arthritis, unspecified: Secondary | ICD-10-CM | POA: Diagnosis not present

## 2024-07-02 DIAGNOSIS — I447 Left bundle-branch block, unspecified: Secondary | ICD-10-CM | POA: Diagnosis not present

## 2024-07-02 DIAGNOSIS — Z471 Aftercare following joint replacement surgery: Secondary | ICD-10-CM | POA: Diagnosis not present

## 2024-07-02 DIAGNOSIS — E1122 Type 2 diabetes mellitus with diabetic chronic kidney disease: Secondary | ICD-10-CM | POA: Diagnosis not present

## 2024-07-02 DIAGNOSIS — H548 Legal blindness, as defined in USA: Secondary | ICD-10-CM | POA: Diagnosis not present

## 2024-07-02 DIAGNOSIS — D631 Anemia in chronic kidney disease: Secondary | ICD-10-CM | POA: Diagnosis not present

## 2024-07-02 DIAGNOSIS — Z7982 Long term (current) use of aspirin: Secondary | ICD-10-CM | POA: Diagnosis not present

## 2024-07-02 DIAGNOSIS — Z79899 Other long term (current) drug therapy: Secondary | ICD-10-CM | POA: Diagnosis not present

## 2024-07-02 DIAGNOSIS — I4719 Other supraventricular tachycardia: Secondary | ICD-10-CM | POA: Diagnosis not present

## 2024-07-02 DIAGNOSIS — I130 Hypertensive heart and chronic kidney disease with heart failure and stage 1 through stage 4 chronic kidney disease, or unspecified chronic kidney disease: Secondary | ICD-10-CM | POA: Diagnosis not present

## 2024-07-02 DIAGNOSIS — J449 Chronic obstructive pulmonary disease, unspecified: Secondary | ICD-10-CM | POA: Diagnosis not present

## 2024-07-02 DIAGNOSIS — I252 Old myocardial infarction: Secondary | ICD-10-CM | POA: Diagnosis not present

## 2024-07-02 DIAGNOSIS — I5022 Chronic systolic (congestive) heart failure: Secondary | ICD-10-CM | POA: Diagnosis not present

## 2024-07-02 DIAGNOSIS — Z7984 Long term (current) use of oral hypoglycemic drugs: Secondary | ICD-10-CM | POA: Diagnosis not present

## 2024-07-02 DIAGNOSIS — I051 Rheumatic mitral insufficiency: Secondary | ICD-10-CM | POA: Diagnosis not present

## 2024-07-02 DIAGNOSIS — I428 Other cardiomyopathies: Secondary | ICD-10-CM | POA: Diagnosis not present

## 2024-07-02 DIAGNOSIS — I4892 Unspecified atrial flutter: Secondary | ICD-10-CM | POA: Diagnosis not present

## 2024-07-02 DIAGNOSIS — H3530 Unspecified macular degeneration: Secondary | ICD-10-CM | POA: Diagnosis not present

## 2024-07-02 DIAGNOSIS — K219 Gastro-esophageal reflux disease without esophagitis: Secondary | ICD-10-CM | POA: Diagnosis not present

## 2024-07-07 ENCOUNTER — Ambulatory Visit (INDEPENDENT_AMBULATORY_CARE_PROVIDER_SITE_OTHER)

## 2024-07-07 DIAGNOSIS — I5022 Chronic systolic (congestive) heart failure: Secondary | ICD-10-CM

## 2024-07-07 DIAGNOSIS — Z95810 Presence of automatic (implantable) cardiac defibrillator: Secondary | ICD-10-CM

## 2024-07-07 NOTE — Progress Notes (Signed)
 EPIC Encounter for ICM Monitoring  Patient Name: Katherine Larson is a 78 y.o. female Date: 07/07/2024 Primary Care Physican: Teresa Channel, MD Primary Cardiologist: Rolan Electrophysiologist: Waddell 01/25/2024 Office Weight: 126 lbs     04/17/2024 Office Weight: 128 lbs    06/24/2024 Weight: 128 lbs (125 lbs baseline)        07/07/2024 Weight: 128 lbs (125 lbs on 8/17)                                         Spoke with husband per DPR and heart failure questions reviewed.  Transmission results reviewed.    Spoke with Home PT and she reports patient had 3 lb weight gain overnight.     CorVue thoracic impedance suggesting possible fluid accumulation starting 8/12 following surgery for Total knee replacement.   Prescribed:  Furosemide  40 mg take 0.5 tablet(s) (20 mg total) by mouth daily.   Labs: 06/18/2024 Creatinine 0.92, BUN 25, Potassium 4.3, Sodium 140, GFR >60 06/16/2024 Creatinine 0.99, BUN 27, Potassium 4.2, Sodium 136, GFR 59 04/25/2024 Creatinine 1.18, BUN 27, Potassium 4.7, Sodium 140, GFR 48  04/17/2024 Creatinine 1.25, BUN 32, Potassium 5.5, Sodium 136, GFR 45  03/28/2024 Creatinine 1.11, BUN 28, Potassium 5.3, Sodium 141, GFR 52  03/17/2024 Creatinine 1.39, BUN 34, Potassium 4.6, Sodium 140 12/27/2023 Creatinine 1.04, BUN 32, Potassium 4.4, Sodium 137, GFR 56  11/22/2023 Creatinine 1.17, BUN 33, Potassium 4.5, Sodium 135, GFR 48  A complete set of results can be found in Results Review.   Recommendations:  Advised confirmed patient is going to 8/19 visit with Dr Rolan.  Advised to take Lasix  40 mg this evening and any further recommendations will be made tomorrow at the office visit.     Follow-up plan: ICM clinic phone appointment on 07/14/2024 to check fluid levels after Total knee surgery.   91 day device clinic remote transmission 08/01/2024.     EP/Cardiology Office Visits: 07/08/2024 with Dr Rolan.  08/14/2024 with Daphne Barrack, NP.   Copy of ICM check sent to Dr.  Waddell.   3 month ICM trend: 07/07/2024.    12-14 Month ICM trend:     Katherine GORMAN Garner, RN 07/07/2024 3:54 PM

## 2024-07-08 ENCOUNTER — Ambulatory Visit (HOSPITAL_COMMUNITY): Payer: Self-pay | Admitting: Cardiology

## 2024-07-08 ENCOUNTER — Encounter (HOSPITAL_COMMUNITY): Payer: Self-pay | Admitting: Cardiology

## 2024-07-08 ENCOUNTER — Ambulatory Visit (HOSPITAL_COMMUNITY)
Admission: RE | Admit: 2024-07-08 | Discharge: 2024-07-08 | Disposition: A | Discharge status: Home / Self Care | Source: Ambulatory Visit | Attending: Cardiology | Admitting: Cardiology

## 2024-07-08 VITALS — BP 110/58 | HR 103 | Ht 62.5 in | Wt 125.4 lb

## 2024-07-08 DIAGNOSIS — Z951 Presence of aortocoronary bypass graft: Secondary | ICD-10-CM | POA: Insufficient documentation

## 2024-07-08 DIAGNOSIS — M069 Rheumatoid arthritis, unspecified: Secondary | ICD-10-CM | POA: Insufficient documentation

## 2024-07-08 DIAGNOSIS — Z87891 Personal history of nicotine dependence: Secondary | ICD-10-CM | POA: Insufficient documentation

## 2024-07-08 DIAGNOSIS — I5022 Chronic systolic (congestive) heart failure: Secondary | ICD-10-CM

## 2024-07-08 DIAGNOSIS — I429 Cardiomyopathy, unspecified: Secondary | ICD-10-CM | POA: Diagnosis not present

## 2024-07-08 DIAGNOSIS — I110 Hypertensive heart disease with heart failure: Secondary | ICD-10-CM | POA: Diagnosis not present

## 2024-07-08 DIAGNOSIS — Z95810 Presence of automatic (implantable) cardiac defibrillator: Secondary | ICD-10-CM | POA: Insufficient documentation

## 2024-07-08 DIAGNOSIS — I2510 Atherosclerotic heart disease of native coronary artery without angina pectoris: Secondary | ICD-10-CM | POA: Diagnosis not present

## 2024-07-08 DIAGNOSIS — J9611 Chronic respiratory failure with hypoxia: Secondary | ICD-10-CM | POA: Insufficient documentation

## 2024-07-08 DIAGNOSIS — Z79899 Other long term (current) drug therapy: Secondary | ICD-10-CM | POA: Insufficient documentation

## 2024-07-08 DIAGNOSIS — Z96651 Presence of right artificial knee joint: Secondary | ICD-10-CM | POA: Insufficient documentation

## 2024-07-08 DIAGNOSIS — Z7984 Long term (current) use of oral hypoglycemic drugs: Secondary | ICD-10-CM | POA: Diagnosis not present

## 2024-07-08 LAB — BRAIN NATRIURETIC PEPTIDE: B Natriuretic Peptide: 161.4 pg/mL — ABNORMAL HIGH (ref 0.0–100.0)

## 2024-07-08 LAB — LIPID PANEL
Cholesterol: 59 mg/dL (ref 0–200)
HDL: 35 mg/dL — ABNORMAL LOW (ref >40)
LDL Cholesterol: 6 mg/dL (ref 0–99)
Total CHOL/HDL Ratio: 1.7 ratio
Triglycerides: 89 mg/dL (ref <150)
VLDL: 18 mg/dL (ref 0–40)

## 2024-07-08 LAB — BASIC METABOLIC PANEL WITH GFR
Anion gap: 12 (ref 5–15)
BUN: 28 mg/dL — ABNORMAL HIGH (ref 8–23)
CO2: 28 mmol/L (ref 22–32)
Calcium: 10.3 mg/dL (ref 8.9–10.3)
Chloride: 99 mmol/L (ref 98–111)
Creatinine, Ser: 1.34 mg/dL — ABNORMAL HIGH (ref 0.44–1.00)
GFR, Estimated: 41 mL/min — ABNORMAL LOW (ref >60)
Glucose, Bld: 142 mg/dL — ABNORMAL HIGH (ref 70–99)
Potassium: 4.8 mmol/L (ref 3.5–5.1)
Sodium: 139 mmol/L (ref 135–145)

## 2024-07-08 MED ORDER — SPIRONOLACTONE 25 MG PO TABS: 12.5000 mg | ORAL_TABLET | Freq: Every day | ORAL | 3 refills | Status: DC

## 2024-07-08 NOTE — Patient Instructions (Addendum)
 Medication Changes:  START SPIRONOLACTONE  12.5MG  ONCE DAILY   Lab Work:  Labs done today, your results will be available in MyChart, we will contact you for abnormal readings.  THEN LABS AGAIN IN 10 DAYS AS SCHEDULED   Testing/Procedures:  ECHOCARDIOGRAM AS SCHEDULED   LOW POTASSIUM DIET SHEET GIVEN   Follow-Up in: 3 MONTHS AS SCHEDULED WITH APP CLINIC   At the [facility], you and your health needs are our priority. We have a designated team specialized in the treatment of Heart Failure. This Care Team includes your primary Heart Failure Specialized Cardiologist (physician), Advanced Practice Providers (APPs- Physician Assistants and Nurse Practitioners), and Pharmacist who all work together to provide you with the care you need, when you need it.   You may see any of the following providers on your designated Care Team at your next follow up:  Dr. Toribio Fuel Dr. Ezra Shuck Dr. Ria Commander Dr. Odis Brownie Greig Mosses, NP Caffie Shed, [place] [facility] [place], [place] Beckey Coe, NP [place] Lee, NP Tinnie Redman, PharmD   Please be sure to bring in all your medications bottles to every appointment.   Need to Contact Us :  If you have any questions or concerns before your next appointment please send us  a message through [facility] or call our office at [phone].    TO LEAVE A MESSAGE FOR THE NURSE SELECT OPTION 2, PLEASE LEAVE A MESSAGE INCLUDING: YOUR NAME DATE OF BIRTH CALL BACK NUMBER REASON FOR CALL**this is important as we prioritize the call backs  YOU WILL RECEIVE A CALL BACK THE SAME DAY AS LONG AS YOU CALL BEFORE 4:00 PM

## 2024-07-09 ENCOUNTER — Telehealth: Payer: Self-pay | Admitting: *Deleted

## 2024-07-09 DIAGNOSIS — E1142 Type 2 diabetes mellitus with diabetic polyneuropathy: Secondary | ICD-10-CM | POA: Diagnosis not present

## 2024-07-09 DIAGNOSIS — I447 Left bundle-branch block, unspecified: Secondary | ICD-10-CM | POA: Diagnosis not present

## 2024-07-09 DIAGNOSIS — Z471 Aftercare following joint replacement surgery: Secondary | ICD-10-CM | POA: Diagnosis not present

## 2024-07-09 DIAGNOSIS — I130 Hypertensive heart and chronic kidney disease with heart failure and stage 1 through stage 4 chronic kidney disease, or unspecified chronic kidney disease: Secondary | ICD-10-CM | POA: Diagnosis not present

## 2024-07-09 DIAGNOSIS — J449 Chronic obstructive pulmonary disease, unspecified: Secondary | ICD-10-CM

## 2024-07-09 DIAGNOSIS — E1122 Type 2 diabetes mellitus with diabetic chronic kidney disease: Secondary | ICD-10-CM | POA: Diagnosis not present

## 2024-07-09 DIAGNOSIS — I428 Other cardiomyopathies: Secondary | ICD-10-CM | POA: Diagnosis not present

## 2024-07-09 DIAGNOSIS — K219 Gastro-esophageal reflux disease without esophagitis: Secondary | ICD-10-CM | POA: Diagnosis not present

## 2024-07-09 DIAGNOSIS — Z7982 Long term (current) use of aspirin: Secondary | ICD-10-CM | POA: Diagnosis not present

## 2024-07-09 DIAGNOSIS — H3530 Unspecified macular degeneration: Secondary | ICD-10-CM | POA: Diagnosis not present

## 2024-07-09 DIAGNOSIS — H548 Legal blindness, as defined in USA: Secondary | ICD-10-CM | POA: Diagnosis not present

## 2024-07-09 DIAGNOSIS — J9601 Acute respiratory failure with hypoxia: Secondary | ICD-10-CM

## 2024-07-09 DIAGNOSIS — I4892 Unspecified atrial flutter: Secondary | ICD-10-CM | POA: Diagnosis not present

## 2024-07-09 DIAGNOSIS — E785 Hyperlipidemia, unspecified: Secondary | ICD-10-CM | POA: Diagnosis not present

## 2024-07-09 DIAGNOSIS — N189 Chronic kidney disease, unspecified: Secondary | ICD-10-CM | POA: Diagnosis not present

## 2024-07-09 DIAGNOSIS — Z79899 Other long term (current) drug therapy: Secondary | ICD-10-CM | POA: Diagnosis not present

## 2024-07-09 DIAGNOSIS — M109 Gout, unspecified: Secondary | ICD-10-CM | POA: Diagnosis not present

## 2024-07-09 DIAGNOSIS — Z7984 Long term (current) use of oral hypoglycemic drugs: Secondary | ICD-10-CM | POA: Diagnosis not present

## 2024-07-09 DIAGNOSIS — M069 Rheumatoid arthritis, unspecified: Secondary | ICD-10-CM | POA: Diagnosis not present

## 2024-07-09 DIAGNOSIS — I2510 Atherosclerotic heart disease of native coronary artery without angina pectoris: Secondary | ICD-10-CM | POA: Diagnosis not present

## 2024-07-09 DIAGNOSIS — I252 Old myocardial infarction: Secondary | ICD-10-CM | POA: Diagnosis not present

## 2024-07-09 DIAGNOSIS — D631 Anemia in chronic kidney disease: Secondary | ICD-10-CM | POA: Diagnosis not present

## 2024-07-09 DIAGNOSIS — I5022 Chronic systolic (congestive) heart failure: Secondary | ICD-10-CM | POA: Diagnosis not present

## 2024-07-09 DIAGNOSIS — I051 Rheumatic mitral insufficiency: Secondary | ICD-10-CM | POA: Diagnosis not present

## 2024-07-09 DIAGNOSIS — I4719 Other supraventricular tachycardia: Secondary | ICD-10-CM | POA: Diagnosis not present

## 2024-07-09 NOTE — Telephone Encounter (Signed)
 Looks like her oxygen  requirements are changed after her recent knee surgery She will need to be on what ever liter flow of oxygen  it takes to maintain O2 sat above 88% Please send in the new order and bring her to the office for a qualifying walk and a follow-up visit.

## 2024-07-09 NOTE — Telephone Encounter (Signed)
 We received a call from the physical therapist Lenward with [facility], she states that she needs clarification on what the concentrator should be set on.  She states it is set on 6L.  I let her know that as of the last note, she was on 2L as needed and at HS.  The patient states she is wearing the oxygen  at HS, but is not wearing 6L.  She was asked if she is just turning on the machine or if she changes the liter flow on the machine.  She said she is just turning on the machine.  She was walking with the PT when her sats dropped to 87% after walking only 90 ft.  She was put on the POC, but could not keep her sats out of the 80's.  She was hooked up to the home concentrator and her sats came up to 91% on 4L, she then dropped to 86%, however there was a knot in the oxygen  tubing.  The oxygen  tubing was changed out and her sats came up to 97% on 4L.  She did have knee surgery and is recovering from that.  She ran a fever of 100.4 on 8/19, she saw the orthopedic doctor and her cardiologist yesterday.  She denies any cough, congestion or chest discomfort.  I let Lenward know that her sats need to stay above 88-90%, however I would get a message to Dr. Theophilus to get clarification on what liter flow she should be on and any other recommendations he may have.  I let her know that we would call her back with recommendations before the end of the day.  She can be reached at [phone].  I let her know if this is a change since surgery, she will likely need an office visit for evaluation.  Dr. Theophilus, The last note states 2L as needed and at [facility].  Please advise what liter flow patient should be on if different than 2L.  Does she need an OV since this is a change in oxygen  need since surgery?  Please advise.  Thank you.

## 2024-07-09 NOTE — Telephone Encounter (Signed)
 LVM for Lenward with regarding recommendations from Dr. Theophilus.  I called and spoke with patient, provided recommendations per Dr. Theophilus.  [facility] f/u with patient's husband (patient is legally blind).  She is scheduled for 07/22/24 at 10:30 am, advised to arrive by 10:15 am for check in.  They verbalized understanding.  Nothing further needed.

## 2024-07-09 NOTE — Progress Notes (Signed)
 [facility] Progress Note    PCP: Teresa Channel, MD Cardiology: Dr. Delford HF Cardiology: Dr. Rolan  Reason for Visit: CHF  78 y.o. with history of CAD s/p CABG and ischemic cardiomyopathy was referred by Dr. Delford for evaluation of CHF.  Patient has a long h/o heart disease.  In 1996, she had CABG with sequential LIMA-LAD and diagonal.  Last cath in 5/21 showed totally occluded LAD with patent LIMA-LAD and diagonal, minimal disease in LCx and RCA. LV systolic function has been reduced.  She has a Secondary school teacher ICD.  Last echo in 9/22 showed EF 25-30%, low normal RV function, moderate MR.  She has a chronic LBBB.  She additionally has rheumatoid arthritis and significant low back pain for which she has a spinal stimulator.   She tried to take Entresto  in the past and was very tired/fatigued on it.  She was unable to tolerate it even at low dose and stopped it.  She was lightheaded when we tried to increase Coreg  to 12.5 mg bid.   Follow up 1/24, NYHA II and volume stable. 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 ID and Cardiology consulted, and underwent TEE, showing LVEF 30-35%, normal RV, no LAA thrombus, no valvular or ICD vegetations. She was continued on 2 weeks of ciprofloxacin .  Echo in 4/25 showed EF 35-40%, normal RV.   St Jude device upgraded to CRT-D in 6/25.    She returns for followup of CHF.  Had right TKR in 8/25, now recovering.  Not walking much yet.  Weight down 3 lbs.  No dyspnea with ADLs.  No chest pain.  No orthopnea/PND. No lightheadedness.    St Jude device interrogation: 99% BiV pacing, no VT/AF, stable thoracic impedance.   ECG: NSR with BiV pacing  Labs (1/24): K 4.4, creatinine 0.81, hgb 10.8 Labs (3/24): K 4.2, creatinine 0.95 Labs (7/24): K 4.4,  creatinine 0.98 Labs (2/25): K 4.4, creatinine 1.04 Labs (4/25): hgb 10.4, LDL 122  Labs (5/25): K 5.3, creatinine 1.11 Labs (7/25): K 4.3, creatinine 0.92  PMH: 1. HTN 2. Hyperlipidemia 3. GERD 4. Gout 5. Fe deficiency anemia 6. CAD: CABG in 1996 with sequential LIMA-diagonal and LAD.  - LHC (5/21): totally occluded LAD with patent sequent LIMA-D and LAD, no significant LCx or RCA disease.  7. Chronic systolic CHF: Ischemic cardiomyopathy.  St Jude CRT-D device.  - Echo (9/22): EF 25-30%, low normal RV function, moderate MR.  - Echo (2/24): EF 30-35%, normal RV - TEE (3/24): LVEF 30-35%, normal RV, no LAA thrombus, no valvular or ICD vegetations. - Echo (4/25): EF 35-40%, normal RV 8. Low back pain: Arthritis, has spinal stimulator.  9. Chronic LBBB 10. Rheumatoid arthritis.  11. COPD: Prior smoker.   Social History   Socioeconomic History   Marital status: Married    Spouse name: Not on file   Number of children: 3   Years of education: Not on file   Highest education level: Not on file  Occupational History   Not on file  Tobacco Use   Smoking status: Former    Current packs/day: 0.00    Average packs/day: 0.8 packs/day for 15.0 years (11.3 ttl pk-yrs)    Types: Cigarettes    Start date: 11/21/1979    Quit date: 11/20/1994  Years since quitting: 29.6   Smokeless tobacco: Never   Tobacco comments:    Quit in 1996  Vaping Use   Vaping status: Never Used  Substance and Sexual Activity   Alcohol  use: Never    Alcohol /week: 0.0 standard drinks of alcohol    Drug use: Never   Sexual activity: Yes  Other Topics Concern   Not on file  Social History Narrative   2 living children, one deceased age 32   Right handed   One story home   Drinks coffee am   Social Drivers of Corporate investment banker Strain: Not on file  Food Insecurity: No Food Insecurity (06/30/2024)   Hunger Vital Sign    Worried About Running Out of Food in the Last Year: Never true    Ran Out  of Food in the Last Year: Never true  Transportation Needs: No Transportation Needs (06/30/2024)   PRAPARE - Administrator, Civil Service (Medical): No    Lack of Transportation (Non-Medical): No  Physical Activity: Not on file  Stress: Not on file  Social Connections: Moderately Isolated (06/30/2024)   Social Connection and Isolation Panel    Frequency of Communication with Friends and Family: More than three times a week    Frequency of Social Gatherings with Friends and Family: More than three times a week    Attends Religious Services: Never    Database administrator or Organizations: No    Attends Banker Meetings: Never    Marital Status: Married  Catering manager Violence: Not At Risk (06/30/2024)   Humiliation, Afraid, Rape, and Kick questionnaire    Fear of Current or Ex-Partner: No    Emotionally Abused: No    Physically Abused: No    Sexually Abused: No   Family History  Problem Relation Age of Onset   Heart attack Father    Heart attack Mother    Bladder Cancer Brother    Cervical cancer Daughter        ?   Colon cancer Neg Hx    ROS: All systems reviewed and negative except as per HPI.   Current Outpatient Medications  Medication Sig Dispense Refill   albuterol  (VENTOLIN  HFA) 108 (90 Base) MCG/ACT inhaler Inhale 1-2 puffs into the lungs every 6 (six) hours as needed for wheezing or shortness of breath. 18 g 0   allopurinol  (ZYLOPRIM ) 300 MG tablet Take 300 mg by mouth daily.     aspirin  EC 325 MG tablet Take 1 tablet (325 mg total) by mouth 2 (two) times daily. 30 tablet 0   atorvastatin  (LIPITOR ) 80 MG tablet Take 80 mg by mouth daily.     carvedilol  (COREG ) 6.25 MG tablet Take 1 tablet (6.25 mg total) by mouth 2 (two) times daily with a meal. 180 tablet 3   cholecalciferol (VITAMIN D3) 25 MCG (1000 UT) tablet Take 1,000 Units by mouth daily.     docusate sodium  (COLACE) 100 MG capsule Take 1 capsule (100 mg total) by mouth 2 (two) times  daily. 60 capsule 2   etanercept (ENBREL SURECLICK) 50 MG/ML injection Inject 50 mg into the skin every Monday.     Evolocumab  (REPATHA  SURECLICK) 140 MG/ML SOAJ Inject 140 mg into the skin every 14 (fourteen) days. 6 mL 3   FARXIGA  10 MG TABS tablet TAKE 1 TABLET(10 MG) BY MOUTH DAILY BEFORE BREAKFAST 30 tablet 10   ferrous sulfate 325 (65 FE) MG tablet Take 325 mg by mouth  daily with breakfast.     fexofenadine (ALLEGRA) 180 MG tablet Take 180 mg by mouth daily.     fluticasone  (FLONASE ) 50 MCG/ACT nasal spray Place 1 spray into both nostrils daily.     furosemide  (LASIX ) 40 MG tablet Take 0.5 tablets (20 mg total) by mouth daily. 45 tablet 3   gabapentin  (NEURONTIN ) 300 MG capsule Take 300 mg by mouth 2 (two) times daily.     lansoprazole  (PREVACID ) 30 MG capsule Take 1 capsule (30 mg total) by mouth daily before breakfast. (Patient taking differently: Take 30 mg by mouth 2 (two) times daily before a meal.) 90 capsule 3   leflunomide  (ARAVA ) 10 MG tablet Take 10 mg by mouth daily.     MAGNESIUM -OXIDE 400 (240 Mg) MG tablet TAKE 1 TABLET(400 MG) BY MOUTH DAILY 30 tablet 2   meclizine  (ANTIVERT ) 12.5 MG tablet Take 12.5 mg by mouth 3 (three) times daily as needed for dizziness.     montelukast  (SINGULAIR ) 10 MG tablet Take 10 mg by mouth at bedtime.     Multiple Vitamins-Minerals (PRESERVISION AREDS 2) CAPS Take 1 capsule by mouth 2 (two) times daily.     nitroGLYCERIN  (NITROSTAT ) 0.4 MG SL tablet DISSOLVE 1 TABLET UNDER THE  TONGUE EVERY 5 MINUTES AS NEEDED FOR CHEST PAIN. MAX OF 3 TABLETS IN 15 MINUTES. CALL 911 IF PAIN  PERSISTS. 75 tablet 4   oxyCODONE -acetaminophen  (PERCOCET) 10-325 MG tablet Take 1 tablet by mouth in the morning, at noon, in the evening, and at bedtime.     sacubitril -valsartan  (ENTRESTO ) 24-26 MG Take 1 tablet by mouth 2 (two) times daily. 60 tablet 6   tiZANidine  (ZANAFLEX ) 2 MG tablet Take 1 tablet (2 mg total) by mouth every 6 (six) hours as needed. 60 tablet 0    TRELEGY ELLIPTA 200-62.5-25 MCG/ACT AEPB Inhale 1 puff into the lungs daily.     VASCEPA  1 g capsule TAKE 1 CAPSULE(1 GRAM) BY MOUTH TWICE DAILY 120 capsule 10   vitamin B-12 (CYANOCOBALAMIN ) 250 MCG tablet Take 250 mcg by mouth daily.     losartan  (COZAAR ) 100 MG tablet Take 1 tablet (100 mg total) by mouth daily. (Patient not taking: Reported on 07/08/2024) 90 tablet 1   oxyCODONE  (ROXICODONE ) 5 MG immediate release tablet Take 1 tablet (5 mg total) by mouth every 4 (four) hours as needed for severe pain (pain score 7-10). (Patient not taking: Reported on 07/08/2024) 30 tablet 0   spironolactone  (ALDACTONE ) 25 MG tablet Take 0.5 tablets (12.5 mg total) by mouth daily. 45 tablet 3   No current facility-administered medications for this encounter.   Wt Readings from Last 3 Encounters:  07/08/24 56.9 kg (125 lb 6.4 oz)  06/30/24 58.2 kg (128 lb 4.9 oz)  06/18/24 58.2 kg (128 lb 4.9 oz)   BP (!) 110/58   Pulse (!) 103   Ht 5' 2.5 (1.588 m)   Wt 56.9 kg (125 lb 6.4 oz)   SpO2 94%   BMI 22.57 kg/m  PHYSICAL EXAM: General: NAD Neck: No JVD, no thyromegaly or thyroid  nodule.  Lungs: Clear to auscultation bilaterally with normal respiratory effort. CV: Nondisplaced PMI.  Heart regular S1/S2, no S3/S4, no murmur.  No peripheral edema.  No carotid bruit.  Normal pedal pulses.  Abdomen: Soft, nontender, no hepatosplenomegaly, no distention.  Skin: Intact without lesions or rashes.  Neurologic: Alert and oriented x 3.  Psych: Normal affect. Extremities: No clubbing or cyanosis.  HEENT: Normal.   Assessment/Plan: 1. Chronic systolic  CHF: St Jude CRT-D device.  Probably ischemic cardiomyopathy (h/o occluded LAD, s/p CABG). Coronaries have been unchanged since CABG in 1996. However, EF has declined over the years and I worry that another process may be present.  Unable to obtain cardiac MRI with spinal cord stimulator. 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 TEE (3/24) showed LVEF 30-35%, normal RV. Echo in 2/25 showed EF 35-40%, normal RV. NYHA class II, not volume overloaded on exam or by Corvue.   - Continue Entresto  24-26 mg bid. Did not tolerate higher doses in the past   - Restart spironolactone  12.5 mg daily and follow low K diet.  BMET/BNP today and BMET in 10 days.  - Continue Farxiga  10 mg daily.  - Continue Coreg  6.25 mg bid.  - Echo to reassess EF post-CRT upgrade.  2. CAD: CABG 1996.  Cath in 5/21 due to fall in EF showed totally occluded LAD, patent RCA and LCx, patent LIMA-LAD and diagonal.  No change, no interventional target.  Stable w/o CP.  - Continue ASA 81 daily.  - Continue atorvastatin  80 mg daily and Repatha , check lipids today.  3. Chronic Respiratory Failure with hypoxia: Admission (3/24) for multilobar PNA, BCx + pseudomonas aeruginosa. Required BiPap and eventually discharged with home O2. CXR suggestive of chronic emphysema and scarring. Of note, had COVID 2/24. Using oxygen  at night.  4. Rheumatoid arthritis: Now s/p right TKR.   Followup APP 3 months.   I spent 31 minutes reviewing data, interviewing patient, and organizing the orders/followup.    Ezra Shuck,  07/09/2024

## 2024-07-10 DIAGNOSIS — M1711 Unilateral primary osteoarthritis, right knee: Secondary | ICD-10-CM | POA: Diagnosis not present

## 2024-07-11 ENCOUNTER — Telehealth: Payer: Self-pay | Admitting: Pulmonary Disease

## 2024-07-11 DIAGNOSIS — Z7982 Long term (current) use of aspirin: Secondary | ICD-10-CM | POA: Diagnosis not present

## 2024-07-11 DIAGNOSIS — Z79899 Other long term (current) drug therapy: Secondary | ICD-10-CM | POA: Diagnosis not present

## 2024-07-11 DIAGNOSIS — M069 Rheumatoid arthritis, unspecified: Secondary | ICD-10-CM | POA: Diagnosis not present

## 2024-07-11 DIAGNOSIS — I447 Left bundle-branch block, unspecified: Secondary | ICD-10-CM | POA: Diagnosis not present

## 2024-07-11 DIAGNOSIS — J449 Chronic obstructive pulmonary disease, unspecified: Secondary | ICD-10-CM | POA: Diagnosis not present

## 2024-07-11 DIAGNOSIS — Z7984 Long term (current) use of oral hypoglycemic drugs: Secondary | ICD-10-CM | POA: Diagnosis not present

## 2024-07-11 DIAGNOSIS — Z471 Aftercare following joint replacement surgery: Secondary | ICD-10-CM | POA: Diagnosis not present

## 2024-07-11 DIAGNOSIS — I428 Other cardiomyopathies: Secondary | ICD-10-CM | POA: Diagnosis not present

## 2024-07-11 DIAGNOSIS — I051 Rheumatic mitral insufficiency: Secondary | ICD-10-CM | POA: Diagnosis not present

## 2024-07-11 DIAGNOSIS — I130 Hypertensive heart and chronic kidney disease with heart failure and stage 1 through stage 4 chronic kidney disease, or unspecified chronic kidney disease: Secondary | ICD-10-CM | POA: Diagnosis not present

## 2024-07-11 DIAGNOSIS — H3530 Unspecified macular degeneration: Secondary | ICD-10-CM | POA: Diagnosis not present

## 2024-07-11 DIAGNOSIS — I4892 Unspecified atrial flutter: Secondary | ICD-10-CM | POA: Diagnosis not present

## 2024-07-11 DIAGNOSIS — H548 Legal blindness, as defined in USA: Secondary | ICD-10-CM | POA: Diagnosis not present

## 2024-07-11 DIAGNOSIS — E1142 Type 2 diabetes mellitus with diabetic polyneuropathy: Secondary | ICD-10-CM | POA: Diagnosis not present

## 2024-07-11 DIAGNOSIS — I252 Old myocardial infarction: Secondary | ICD-10-CM | POA: Diagnosis not present

## 2024-07-11 DIAGNOSIS — D631 Anemia in chronic kidney disease: Secondary | ICD-10-CM | POA: Diagnosis not present

## 2024-07-11 DIAGNOSIS — I2510 Atherosclerotic heart disease of native coronary artery without angina pectoris: Secondary | ICD-10-CM | POA: Diagnosis not present

## 2024-07-11 DIAGNOSIS — E785 Hyperlipidemia, unspecified: Secondary | ICD-10-CM | POA: Diagnosis not present

## 2024-07-11 DIAGNOSIS — N189 Chronic kidney disease, unspecified: Secondary | ICD-10-CM | POA: Diagnosis not present

## 2024-07-11 DIAGNOSIS — M109 Gout, unspecified: Secondary | ICD-10-CM | POA: Diagnosis not present

## 2024-07-11 DIAGNOSIS — I4719 Other supraventricular tachycardia: Secondary | ICD-10-CM | POA: Diagnosis not present

## 2024-07-11 DIAGNOSIS — I5022 Chronic systolic (congestive) heart failure: Secondary | ICD-10-CM | POA: Diagnosis not present

## 2024-07-11 DIAGNOSIS — E1122 Type 2 diabetes mellitus with diabetic chronic kidney disease: Secondary | ICD-10-CM | POA: Diagnosis not present

## 2024-07-11 DIAGNOSIS — K219 Gastro-esophageal reflux disease without esophagitis: Secondary | ICD-10-CM | POA: Diagnosis not present

## 2024-07-11 NOTE — Telephone Encounter (Signed)
 Per Avelina at [employer]-  Please confirm is this for a POC EVAL?

## 2024-07-14 ENCOUNTER — Inpatient Hospital Stay

## 2024-07-14 ENCOUNTER — Ambulatory Visit (INDEPENDENT_AMBULATORY_CARE_PROVIDER_SITE_OTHER)

## 2024-07-14 DIAGNOSIS — H548 Legal blindness, as defined in USA: Secondary | ICD-10-CM | POA: Diagnosis not present

## 2024-07-14 DIAGNOSIS — I447 Left bundle-branch block, unspecified: Secondary | ICD-10-CM | POA: Diagnosis not present

## 2024-07-14 DIAGNOSIS — D631 Anemia in chronic kidney disease: Secondary | ICD-10-CM | POA: Diagnosis not present

## 2024-07-14 DIAGNOSIS — M069 Rheumatoid arthritis, unspecified: Secondary | ICD-10-CM | POA: Diagnosis not present

## 2024-07-14 DIAGNOSIS — Z79899 Other long term (current) drug therapy: Secondary | ICD-10-CM | POA: Diagnosis not present

## 2024-07-14 DIAGNOSIS — I252 Old myocardial infarction: Secondary | ICD-10-CM | POA: Diagnosis not present

## 2024-07-14 DIAGNOSIS — I4892 Unspecified atrial flutter: Secondary | ICD-10-CM | POA: Diagnosis not present

## 2024-07-14 DIAGNOSIS — M109 Gout, unspecified: Secondary | ICD-10-CM | POA: Diagnosis not present

## 2024-07-14 DIAGNOSIS — I051 Rheumatic mitral insufficiency: Secondary | ICD-10-CM | POA: Diagnosis not present

## 2024-07-14 DIAGNOSIS — I428 Other cardiomyopathies: Secondary | ICD-10-CM | POA: Diagnosis not present

## 2024-07-14 DIAGNOSIS — K219 Gastro-esophageal reflux disease without esophagitis: Secondary | ICD-10-CM | POA: Diagnosis not present

## 2024-07-14 DIAGNOSIS — I5022 Chronic systolic (congestive) heart failure: Secondary | ICD-10-CM | POA: Diagnosis not present

## 2024-07-14 DIAGNOSIS — H3530 Unspecified macular degeneration: Secondary | ICD-10-CM | POA: Diagnosis not present

## 2024-07-14 DIAGNOSIS — Z471 Aftercare following joint replacement surgery: Secondary | ICD-10-CM | POA: Diagnosis not present

## 2024-07-14 DIAGNOSIS — E1122 Type 2 diabetes mellitus with diabetic chronic kidney disease: Secondary | ICD-10-CM | POA: Diagnosis not present

## 2024-07-14 DIAGNOSIS — E1142 Type 2 diabetes mellitus with diabetic polyneuropathy: Secondary | ICD-10-CM | POA: Diagnosis not present

## 2024-07-14 DIAGNOSIS — N189 Chronic kidney disease, unspecified: Secondary | ICD-10-CM | POA: Diagnosis not present

## 2024-07-14 DIAGNOSIS — Z7984 Long term (current) use of oral hypoglycemic drugs: Secondary | ICD-10-CM | POA: Diagnosis not present

## 2024-07-14 DIAGNOSIS — Z95810 Presence of automatic (implantable) cardiac defibrillator: Secondary | ICD-10-CM

## 2024-07-14 DIAGNOSIS — Z7982 Long term (current) use of aspirin: Secondary | ICD-10-CM | POA: Diagnosis not present

## 2024-07-14 DIAGNOSIS — E785 Hyperlipidemia, unspecified: Secondary | ICD-10-CM | POA: Diagnosis not present

## 2024-07-14 DIAGNOSIS — J449 Chronic obstructive pulmonary disease, unspecified: Secondary | ICD-10-CM | POA: Diagnosis not present

## 2024-07-14 DIAGNOSIS — I130 Hypertensive heart and chronic kidney disease with heart failure and stage 1 through stage 4 chronic kidney disease, or unspecified chronic kidney disease: Secondary | ICD-10-CM | POA: Diagnosis not present

## 2024-07-14 DIAGNOSIS — I4719 Other supraventricular tachycardia: Secondary | ICD-10-CM | POA: Diagnosis not present

## 2024-07-14 DIAGNOSIS — I2510 Atherosclerotic heart disease of native coronary artery without angina pectoris: Secondary | ICD-10-CM | POA: Diagnosis not present

## 2024-07-14 NOTE — Telephone Encounter (Signed)
 According to the notes, this is a liter increase.

## 2024-07-14 NOTE — Telephone Encounter (Signed)
 I have sent this info to the DME company. NFN

## 2024-07-16 ENCOUNTER — Telehealth: Payer: Self-pay

## 2024-07-16 ENCOUNTER — Ambulatory Visit (HOSPITAL_COMMUNITY)

## 2024-07-16 NOTE — Telephone Encounter (Signed)
 Remote ICM transmission received.  Attempted call to patient regarding ICM remote transmission and left detailed message per DPR.  Left ICM phone number and advised to return call for any fluid symptoms or questions. Next ICM remote transmission scheduled 08/04/2024.

## 2024-07-16 NOTE — Progress Notes (Signed)
 EPIC Encounter for ICM Monitoring  Patient Name: VAUDINE DUTAN is a 78 y.o. female Date: 07/16/2024 Primary Care Physican: Teresa Channel, MD Primary Cardiologist: Rolan Electrophysiologist: Waddell 01/25/2024 Office Weight: 126 lbs     04/17/2024 Office Weight: 128 lbs    06/24/2024 Weight: 128 lbs (125 lbs baseline)        07/07/2024 Weight: 128 lbs (125 lbs on 8/17)                                         Attempted call to patient and unable to reach.  Left detailed message per DPR regarding transmission.  Transmission results reviewed.    CorVue thoracic impedance suggesting possible fluid accumulation starting 8/12 following surgery for Total knee replacement and returned to baseline 8/19 after taking    Prescribed:  Furosemide  40 mg take 0.5 tablet(s) (20 mg total) by mouth daily.   Labs: 07/18/2024 BMET scheduled  07/08/2024 Creatinine 1.34, BUN 28, Potassium 4.8, Sodium 139, GFR 41 06/18/2024 Creatinine 0.92, BUN 25, Potassium 4.3, Sodium 140, GFR >60 06/16/2024 Creatinine 0.99, BUN 27, Potassium 4.2, Sodium 136, GFR 59 04/25/2024 Creatinine 1.18, BUN 27, Potassium 4.7, Sodium 140, GFR 48  04/17/2024 Creatinine 1.25, BUN 32, Potassium 5.5, Sodium 136, GFR 45  03/28/2024 Creatinine 1.11, BUN 28, Potassium 5.3, Sodium 141, GFR 52  03/17/2024 Creatinine 1.39, BUN 34, Potassium 4.6, Sodium 140 12/27/2023 Creatinine 1.04, BUN 32, Potassium 4.4, Sodium 137, GFR 56  11/22/2023 Creatinine 1.17, BUN 33, Potassium 4.5, Sodium 135, GFR 48  A complete set of results can be found in Results Review.   Recommendations:  Left voice mail with ICM number and encouraged to call if experiencing any fluid symptoms.    Follow-up plan: ICM clinic phone appointment on 08/04/2024.   91 day device clinic remote transmission 08/01/2024.     EP/Cardiology Office Visits:   10/08/2024 with HF clinc.  08/14/2024 with Daphne Barrack, NP.   Copy of ICM check sent to Dr. Waddell.   3 month ICM trend:  07/14/2024.    12-14 Month ICM trend:     Mitzie GORMAN Garner, RN 07/16/2024 4:23 PM

## 2024-07-17 ENCOUNTER — Ambulatory Visit (HOSPITAL_COMMUNITY): Attending: Internal Medicine

## 2024-07-17 ENCOUNTER — Encounter

## 2024-07-17 ENCOUNTER — Encounter (HOSPITAL_COMMUNITY): Payer: Self-pay

## 2024-07-17 ENCOUNTER — Other Ambulatory Visit: Payer: Self-pay

## 2024-07-17 DIAGNOSIS — M25561 Pain in right knee: Secondary | ICD-10-CM | POA: Diagnosis present

## 2024-07-17 DIAGNOSIS — Z7409 Other reduced mobility: Secondary | ICD-10-CM

## 2024-07-17 NOTE — Therapy (Signed)
 OUTPATIENT PHYSICAL THERAPY LOWER EXTREMITY EVALUATION   Patient Name: Katherine Larson MRN: [id number] DOB:[birth year], 78 y.o., female Today's Date: 07/17/2024  END OF SESSION:  PT End of Session - 07/17/24 1609     Visit Number 1    Date for PT Re-Evaluation 08/28/24    Authorization Type [employer] MEDICARE    Authorization Time Period seeking auth, 6 approved automatically    Authorization - Visit Number 0    Progress Note Due on Visit 10    PT Start Time 1600    PT Stop Time 1645    PT Time Calculation (min) 45 min    Activity Tolerance Patient tolerated treatment well    Behavior During Therapy WFL for tasks assessed/performed          Past Medical History:  Diagnosis Date   AICD (automatic cardioverter/defibrillator) present    Anemia    anemia of chronic disease +/- IDA followed by Dr. Gatha. received iron  infusions in the past   Borderline glaucoma    CAD (coronary artery disease)    a. s/p CABG 1996. b. low risk nuc 2011. c. LHC 04/2016 due to drop in EF -> occluded native LAD,  widely patent sequential LIMA-D1-LAD.   Chronic kidney disease    mild stage of kidney disease   Chronic systolic CHF (congestive heart failure) (HCC)    Complication of anesthesia    DM2 (diabetes mellitus, type 2) (HCC)    not on any medicine for this at this time, 05/2016   Dyspnea    with exertion   GERD (gastroesophageal reflux disease)    Gout    History of blood transfusion    History of hiatal hernia    in 20s   History of pneumonia    March, 2016   HLD (hyperlipidemia)    HTN (hypertension)    Inappropriate sinus tachycardia (HCC)    LBBB (left bundle branch block)    a. 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 Dx 04/2016 - EF 25-30%, diffuse HK, elevated LVEDP, mild MR, mod LAE.   Normocytic anemia 10/17/2021   NSVT  (nonsustained ventricular tachycardia) (HCC)    PAT (paroxysmal atrial tachycardia) (HCC)    Pneumonia    PONV (postoperative nausea and vomiting)    after just about every surgery I've had   Rheumatoid arthritis (HCC)    RA- hands   Seasonal allergies    Past Surgical History:  Procedure Laterality Date   ABDOMINAL HYSTERECTOMY     APPENDECTOMY     BACK SURGERY     BIOPSY  04/27/2020   Procedure: BIOPSY;  Surgeon: Shaaron Lamar HERO, MD;  Location: [facility];  Service: Endoscopy;;  ascending colon biopsy   BIV UPGRADE N/A 05/14/2024   Procedure: BIV UPGRADE;  Surgeon: Waddell Danelle ORN, MD;  Location: [facility] INVASIVE CV LAB;  Service: Cardiovascular;  Laterality: N/A;   CARDIAC CATHETERIZATION N/A 05/01/2016   Procedure: Right/Left Heart Cath and Coronary/Graft Angiography;  Surgeon: Alm ORN Clay, MD;  Location: [facility] INVASIVE CV LAB;  Service: Cardiovascular;  Laterality: N/A;   CHOLECYSTECTOMY     COLONOSCOPY  11/2011   Dr. Magod:ext/int hemorrhoids, diverticulosis sigmoid colon and distal desc colon, mid-desc colon hyperplastic polyps.    COLONOSCOPY WITH PROPOFOL  N/A 04/27/2020  Rourk: Diverticulosis, hemorrhoids, normal terminal ileum.  Random colon biopsies significant for chronic lymphocytic colitis. No repeat due to age.   CORONARY ARTERY BYPASS GRAFT  1996   2 vessels   ESOPHAGOGASTRODUODENOSCOPY  11/2011   Dr. Rosalie: small hiatal hernia, one non-bleeding superficial gastric ulcer, medium-sized diverticulum in area of papilla   ESOPHAGOGASTRODUODENOSCOPY N/A 12/29/2015   Dr. Shaaron: Chronic inactive gastritis, normal esophagus status post dilation, duodenal diverticula   ESOPHAGOGASTRODUODENOSCOPY (EGD) WITH PROPOFOL  N/A 07/24/2019   Dr. Shaaron: Normal esophagus status post empiric dilation, small hiatal hernia, large D2 diverticulum   ESOPHAGOGASTRODUODENOSCOPY (EGD) WITH PROPOFOL  N/A 06/03/2021   Surgeon: Shaaron Lamar HERO, MD; normal esophagus, normal stomach, normal duodenum.   Abnormal hypopharyngeal mucosa bilaterally just distal to the base of the tongue (right side greater than left), overlying mucosa appeared irregular and somewhat verrucous in appearance.  Recommended ENT evaluation.   EYE SURGERY Bilateral    cataract removal   FOOT SURGERY     removal bone spur   FRACTURE SURGERY Right    arm   GIVENS CAPSULE STUDY  11/2012   Dr. Rosalie: minimal gastritis, normal small bowel capsule   HERNIA REPAIR     hiatal hernia   ICD IMPLANT N/A 02/04/2021   Procedure: ICD IMPLANT;  Surgeon: Waddell Danelle ORN, MD;  Location: [facility] INVASIVE CV LAB;  Service: Cardiovascular;  Laterality: N/A;   MALONEY DILATION N/A 07/24/2019   Procedure: AGAPITO DILATION;  Surgeon: Shaaron Lamar HERO, MD;  Location: [facility];  Service: Endoscopy;  Laterality: N/A;   MALONEY DILATION N/A 06/03/2021   Procedure: AGAPITO DILATION;  Surgeon: Shaaron Lamar HERO, MD;  Location: [facility];  Service: Endoscopy;  Laterality: N/A;   MAXIMUM ACCESS (MAS)POSTERIOR LUMBAR INTERBODY FUSION (PLIF) 1 LEVEL N/A 08/14/2013   Procedure:  MAXIMUM ACCESS SURGERY(MAS) POSTERIOR LUMBAR INTERBODY FUSION LUMBAR THREE-FOUR ;  Surgeon: Alm GORMAN Molt, MD;  Location: [facility] NEURO ORS;  Service: Neurosurgery;  Laterality: N/A;   MAXIMUM ACCESS SURGERY(MAS) POSTERIOR LUMBAR INTERBODY FUSION LUMBAR THREE-FOUR    PTCA     RIGHT/LEFT HEART CATH AND CORONARY ANGIOGRAPHY N/A 03/26/2020   Procedure: RIGHT/LEFT HEART CATH AND CORONARY ANGIOGRAPHY;  Surgeon: Anner Alm ORN, MD;  Location: [facility] INVASIVE CV LAB;  Service: Cardiovascular;  Laterality: N/A;   SPINAL CORD STIMULATOR BATTERY EXCHANGE N/A 01/19/2021   Procedure: Spinal cord stimulator battery change;  Surgeon: Darlis Deatrice GORMAN, MD;  Location: [facility] OR;  Service: Neurosurgery;  Laterality: N/A;   SPINAL CORD STIMULATOR INSERTION N/A 06/16/2016   Procedure: LUMBAR SPINAL CORD STIMULATOR INSERTION;  Surgeon: Deward Fabian, MD;  Location: [facility] NEURO [facility];  Service: Neurosurgery;   Laterality: N/A;  LUMBAR SPINAL CORD STIMULATOR INSERTION   TEE WITHOUT CARDIOVERSION N/A 02/01/2023   Procedure: TRANSESOPHAGEAL ECHOCARDIOGRAM (TEE);  Surgeon: Debera Jayson MATSU, MD;  Location: [facility] ORS;  Service: Cardiovascular;  Laterality: N/A;   tens  05/2016   TONSILLECTOMY     TOTAL KNEE ARTHROPLASTY Right 06/30/2024   Procedure: ARTHROPLASTY, KNEE, TOTAL;  Surgeon: Yvone Rush, MD;  Location: [facility] ORS;  Service: Orthopedics;  Laterality: Right;  RIGHT TOTAL KNEE ARTHROPLASTY   Patient Active Problem List   Diagnosis Date Noted   Arthritis of right knee 06/30/2024   Primary osteoarthritis of right knee 06/27/2024   Bacteremia 02/01/2023   Pseudomonas sepsis (HCC) 01/30/2023   Acute respiratory failure with hypoxia (HCC) 01/28/2023   Multifocal pneumonia 01/28/2023   Melena 11/28/2022   Rectal bleeding 11/28/2022   Upper abdominal pain 11/28/2022  Early satiety 07/27/2022   Nausea without vomiting 07/27/2022   Hypotension 02/10/2022   Normocytic anemia 10/17/2021   Lymphocytic colitis 07/02/2020   Coronary artery disease, occlusive: CTO proxLAD 03/26/2020   Abnormal nuclear stress test 03/26/2020   Chronic diarrhea 03/02/2020   NICM- new drop in EF 25-30% 05/03/2016   Acute pulmonary edema (HCC)    LBBB (left bundle branch block) 04/29/2016   Chronic HFrEF (heart failure with reduced ejection fraction) (HCC) 04/29/2016   Abdominal pain, chronic, epigastric 03/07/2016   Septic shock (HCC)    COPD exacerbation (HCC) 01/21/2016   Hyperthyroidism 01/21/2016   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   Hypokalemia 01/19/2016   Hyponatremia 01/19/2016   Diet-controlled diabetes mellitus (HCC) 01/19/2016   CAP (community acquired pneumonia) 01/18/2016   Mucosal abnormality of stomach    Dysphagia    Constipation 12/08/2015   Gastroesophageal reflux disease 12/08/2015   Abnormal CT scan, esophagus 12/08/2015   Esophageal dysphagia 12/08/2015   Abdominal pain, epigastric 12/08/2015    Loss of weight 12/08/2015   Lower abdominal pain 12/08/2015   Back pain 08/26/2015   Headache 08/26/2015   Essential hypertension 08/26/2015   DM type 2 (diabetes mellitus, type 2) (HCC) 08/26/2015   Sinus tachycardia 06/07/2015   Diarrhea 06/07/2015   Preop cardiovascular exam 07/16/2013   Atrial flutter (HCC) 07/16/2013   Anemia 01/08/2013   Leukocytosis 12/17/2012   DYSPNEA 02/02/2010   DIABETES MELLITUS 07/28/2009   Hyperlipidemia 07/28/2009   CHOLECYSTECTOMY, HX OF 07/28/2009   Hx of CABG '96- cath 05/01/16 07/28/2009   HERNIORRHAPHY, HX OF 07/28/2009    PCP: Teresa Channel, MD   REFERRING PROVIDER: Yvone Rush, MD  REFERRING DIAG: s/p rt total knee replacement  THERAPY DIAG:  Right knee pain, unspecified chronicity  Impaired functional mobility, balance, gait, and endurance  Rationale for Evaluation and Treatment: Rehabilitation  ONSET DATE: 06/30/24 RTKA  SUBJECTIVE:   SUBJECTIVE STATEMENT: Pt states she had the surgery the 11th. Pt states she had home health PT about 6 times and had some issues with the oxygen . Previously she jut wore oxygen  at night. Pt has many ongoing health issues and has been struggling with symptoms of UTI and diarrhea the past few days.   PERTINENT HISTORY: -Been using husbands supplemental O2 since home therapy, had just been using it at night before the surgery PAIN:  Are you having pain? Yes: NPRS scale: 3/10 Pain location: right knee Pain description: ache Aggravating factors: being up on it Relieving factors: rest elevated  PRECAUTIONS: Fall  RED FLAGS: UTI symptoms during urination   WEIGHT BEARING RESTRICTIONS: WBAT  FALLS:  Has patient fallen in last 6 months? No  LIVING ENVIRONMENT: Lives with: lives with their spouse Lives in: House/apartment Stairs: No Has following equipment at home: Environmental consultant - 2 wheeled  OCCUPATION: retired, Biomedical engineer  PLOF: Requires assistive device for independence, Needs assistance  with ADLs, and Needs assistance with gait  PATIENT GOALS: walk faster, decrease the knee pain, improve balance, increase LE strength  NEXT MD VISIT: Next Tuesday for Oxygen    OBJECTIVE:  Note: Objective measures were completed at Evaluation unless otherwise noted.  DIAGNOSTIC FINDINGS: none  PATIENT SURVEYS:  LEFS : 35/80  COGNITION: Overall cognitive status: Within functional limits for tasks assessed     SENSATION: Light touch: Impaired numbness around incision  EDEMA:  RLE knee and ankle  PALPATION: Tenderness to palpation at posterior knee and incision area.  LOWER EXTREMITY ROM:  Active ROM Right eval Left eval  Hip  flexion    Hip extension    Hip abduction    Hip adduction    Hip internal rotation    Hip external rotation    Knee flexion 121   Knee extension 3 from neutral   Ankle dorsiflexion    Ankle plantarflexion    Ankle inversion    Ankle eversion     (Blank rows = not tested)  LOWER EXTREMITY MMT:  MMT Right eval Left eval  Hip flexion    Hip extension    Hip abduction    Hip adduction    Hip internal rotation    Hip external rotation    Knee flexion    Knee extension    Ankle dorsiflexion    Ankle plantarflexion    Ankle inversion    Ankle eversion     (Blank rows = not tested)    FUNCTIONAL TESTS:  5 times sit to stand: 16.48 seconds, 94% O2, with [place] 2 minute walk test: 185 feet, 99% O2 with [place]  GAIT: Distance walked: 200 feet Assistive device utilized: Environmental consultant - 2 wheeled Level of assistance: Modified independence Comments: Pt requires therapist assist to carry supplemental O2 supply, decreased gait speed noted, decreased visual acuity noted, and slight antalgic pattern noted with decreased stance time on RLE although minimal.                                                                                                                                 TREATMENT DATE:  07/17/2024  Vitals: O2 sat: 94% O2 after  5TSTS Evaluation: -ROM measured, Strength assessed, HEP prescribed, pt educated on prognosis, findings, and importance of HEP compliance if given.     PATIENT EDUCATION:  Education details: Pt was educated on findings of PT evaluation, prognosis, frequency of therapy visits and rationale, attendance policy, and HEP if given.   Person educated: Patient and Spouse Education method: Explanation, Verbal cues, and Handouts Education comprehension: verbalized understanding, verbal cues required, and needs further education  HOME EXERCISE PROGRAM: Access Code: [email or web address] URL: [email or web address] Date: 07/17/2024 Prepared by: Lang Ada  Exercises - Seated Knee Extension AROM  - 1 x daily - 7 x weekly - 3 sets - 10 reps - Sit to Stand with Armchair  - 1 x daily - 7 x weekly - 3 sets - 10 reps - Heel Raises with Counter Support  - 1 x daily - 7 x weekly - 3 sets - 10 reps  ASSESSMENT:  CLINICAL IMPRESSION: Patient is a 78 y.o. female who was seen today for physical therapy evaluation and treatment for s/p rt total knee replacement.   Patient demonstrates decreased RLE strength, abnormal pain rating, decreased endurance, and impaired balance. Pt reports having symptoms of UTI and drop in O2 saturation into the 30s during home health PT session and has been using spouses old supplemental O2 tank for community ambulation. O2 levels stable throughout session today with supplemental O2,  pt states she has appontment with primary care next Tuesday to address abnormal O2 levels. Patient also demonstrates difficulty with ambulation during today's session with need for therapist assist for supplemental O2 tank carrry and RW for balance, decreased stride length and velocity noted. Patient also demonstrates 121 degrees of knee flexion with no signs of pain and 3 degrees from neutral extension. Pt demonstrates tenderness to palpation to knee cap although mobility WFL, and posterior knee. Pt  unable to stand without UE support for assist this date. Patient requires education on importance of HEP compliance, DVT, prognosis and role of PT. Patient would benefit from skilled physical therapy for decreased R knee pain, increased endurance with ambulation, increased RLE strength, and balance for improved gait quality, return to higher level of function with ADLs, and progress towards therapy goals.  OBJECTIVE IMPAIRMENTS: Abnormal gait, decreased activity tolerance, decreased balance, decreased endurance, decreased knowledge of use of DME, decreased mobility, difficulty walking, decreased ROM, decreased strength, impaired sensation, and pain.   ACTIVITY LIMITATIONS: carrying, lifting, bending, standing, squatting, sleeping, stairs, transfers, and bed mobility  PARTICIPATION LIMITATIONS: meal prep, laundry, interpersonal relationship, shopping, community activity, and yard work  PERSONAL FACTORS: Age, Fitness, Past/current experiences, and Time since onset of injury/illness/exacerbation are also affecting patient's functional outcome.   REHAB POTENTIAL: Good  CLINICAL DECISION MAKING: Stable/uncomplicated  EVALUATION COMPLEXITY: Low   GOALS: Goals reviewed with patient? No  SHORT TERM GOALS: Target date: 08/07/24  Patient will demonstrate evidence of independence with individualized HEP and will report compliance for at least 3 days per week for optimized progression towards remaining therapy goals. Baseline:  Goal status: INITIAL  2.  Patient will report a decrease in pain level during community ambulation by at least 2 points for improved quality of life. Baseline: 3/10 Goal status: INITIAL     LONG TERM GOALS: Target date: 08/28/24  Pt will demonstrate a an increase of at least 9 points on the LEFS for improved performance of community ambulation and ADL. Baseline: see objective Goal status: INITIAL  2.  Pt will improve 2 MWT by 50 feet in order to demonstrate improved  functional ambulatory capacity in community setting.  Baseline: see objective Goal status: INITIAL  3.  Pt will demonstrate WFL ROM (flexion and extension) in right knee, for increased mobility and maximal efficiency of gait cycle during ambulation. Baseline: see objective Goal status: INITIAL  4.  Pt will demonstrate at least 4-/5 MMT for right lower extremity for increased strength during ADL and community ambulation. Baseline: see objective Goal status: INITIAL  5.  Pt will improve 5TSTS by at least 2.3 seconds in order to improve strength during functional activities.. Baseline: see objective Goal status: INITIAL    PLAN:  PT FREQUENCY: 1-2x/week  PT DURATION: 6 weeks  PLANNED INTERVENTIONS: 97110-Therapeutic exercises, 97530- Therapeutic activity, 97112- Neuromuscular re-education, 97535- Self Care, [id number]- Manual therapy, [phone]- Gait training, Patient/Family education, Balance training, Stair training, Joint mobilization, DME instructions, Cryotherapy, and Moist heat  PLAN FOR NEXT SESSION: formal MMT testing of RLE, SLS testing (add goal if necessary), progress RLE strengthening, progress gait training with LRAD   Lang Ada, PT, DPT [facility] Office: [phone] 5:15 PM, 07/17/24  [facility] Medicare Auth Request Information Treatment Start Date: 07/17/24  Date of referral: 06/18/24 Referring provider: Yvone Rush, MD Referring diagnosis (ICD 10)? s/p rt total knee replacement Treatment diagnosis (ICD 10)? (if different than referring diagnosis) M25.561; Z74.09  What was this (referring dx) caused by? Surgery (  Type: SHERL Hawks of Condition: Initial Onset (within last 3 months)   Laterality: Rt  Current Functional Measure Score: LEFS 35/80  Objective measurements identify impairments when they are compared to normal values, the uninvolved extremity, and prior level of function.  [x]  Yes  []  No  Objective assessment of functional  ability: Moderate functional limitations   Briefly describe symptoms: right knee pain, decreased endurance, decreased functional mobilty, impaired balance  How did symptoms start: surgery  Average pain intensity:  Last 24 hours: 3/10  Past week: 5/10  How often does the pt experience symptoms? Frequently  How much have the symptoms interfered with usual daily activities? Moderately  How has condition changed since care began at this facility? NA - initial visit  In general, how is the patients overall health? Fair   BACK PAIN (STarT Back Screening Tool) No

## 2024-07-18 ENCOUNTER — Other Ambulatory Visit (HOSPITAL_COMMUNITY)

## 2024-07-18 ENCOUNTER — Inpatient Hospital Stay

## 2024-07-18 ENCOUNTER — Encounter (HOSPITAL_COMMUNITY): Payer: Self-pay

## 2024-07-18 ENCOUNTER — Encounter (HOSPITAL_COMMUNITY)

## 2024-07-21 DIAGNOSIS — N3000 Acute cystitis without hematuria: Secondary | ICD-10-CM | POA: Diagnosis not present

## 2024-07-21 DIAGNOSIS — R300 Dysuria: Secondary | ICD-10-CM | POA: Diagnosis not present

## 2024-07-21 DIAGNOSIS — M7989 Other specified soft tissue disorders: Secondary | ICD-10-CM | POA: Diagnosis not present

## 2024-07-22 ENCOUNTER — Encounter: Payer: Self-pay | Admitting: Pulmonary Disease

## 2024-07-22 ENCOUNTER — Ambulatory Visit (HOSPITAL_COMMUNITY): Attending: Orthopedic Surgery

## 2024-07-22 ENCOUNTER — Ambulatory Visit
Admission: RE | Admit: 2024-07-22 | Discharge: 2024-07-22 | Disposition: A | Discharge status: Home / Self Care | Source: Ambulatory Visit | Attending: Pulmonary Disease

## 2024-07-22 ENCOUNTER — Ambulatory Visit: Admitting: Pulmonary Disease

## 2024-07-22 ENCOUNTER — Telehealth: Payer: Self-pay

## 2024-07-22 ENCOUNTER — Telehealth (HOSPITAL_COMMUNITY): Payer: Self-pay

## 2024-07-22 VITALS — BP 130/74 | HR 77 | Temp 98.0°F | Ht 62.0 in | Wt 126.0 lb

## 2024-07-22 DIAGNOSIS — M25561 Pain in right knee: Secondary | ICD-10-CM | POA: Insufficient documentation

## 2024-07-22 DIAGNOSIS — J9601 Acute respiratory failure with hypoxia: Secondary | ICD-10-CM

## 2024-07-22 DIAGNOSIS — R0600 Dyspnea, unspecified: Secondary | ICD-10-CM | POA: Diagnosis not present

## 2024-07-22 DIAGNOSIS — Z7409 Other reduced mobility: Secondary | ICD-10-CM | POA: Insufficient documentation

## 2024-07-22 MED ORDER — IOPAMIDOL (ISOVUE-370) INJECTION 76%
75.0000 mL | Freq: Once | INTRAVENOUS | Status: AC | PRN
  Administered 2024-07-22: 75 mL via INTRAVENOUS

## 2024-07-22 MED ORDER — BUDESONIDE-FORMOTEROL FUMARATE 160-4.5 MCG/ACT IN AERO
2.0000 | INHALATION_SPRAY | Freq: Two times a day (BID) | RESPIRATORY_TRACT | 3 refills | Status: AC
Start: 2024-07-22 — End: ?

## 2024-07-22 NOTE — Patient Instructions (Signed)
  VISIT SUMMARY: During your visit, we discussed your low oxygen  levels following your recent knee surgery, as well as your ongoing respiratory issues related to past COVID-19 infection. We also addressed the swelling and bruising in your leg, and reviewed your current respiratory medications.  YOUR PLAN: -HYPOXEMIA REQUIRING SUPPLEMENTAL OXYGEN : Hypoxemia means low levels of oxygen  in your blood. You will need to use 4 liters of oxygen  during the day when you are active. We will also provide smaller portable oxygen  tanks to help you move around more easily. A CT scan of your chest with contrast will be done to check for any blood clots and to assess your lung condition.  -PULMONARY SCARRING FROM PRIOR COVID-19 INFECTION: Pulmonary scarring refers to damage in your lungs from a past COVID-19 infection. We will perform a CT scan of your chest with contrast to see if there have been any changes in the scarring.  -MINIMAL RIGHT BASILAR ATELECTASIS: Atelectasis is when part of your lung does not fully expand. We will reassess this condition with a CT scan of your chest with contrast.  INSTRUCTIONS: Please follow up with your orthopedic doctor to evaluate the swelling and bruising in your leg and to check for any possible blood clots. Additionally, make sure to get the CT scan of your chest with contrast as ordered.

## 2024-07-22 NOTE — Telephone Encounter (Signed)
 Spoke w/ PT is aware that her Ct is today at 3:20 @ [facility]   -NFN

## 2024-07-22 NOTE — Telephone Encounter (Signed)
 No show, called and left message concerning missed apt today. Included next apt date and time with contract information included if needs to cancel/reschedule in the future.   Augustin Mclean, LPTA/CLT; [facility]AIM [phone]

## 2024-07-22 NOTE — Addendum Note (Signed)
 Addended by: Daleena Rotter M on: 07/22/2024 01:19 PM   Modules accepted: Orders

## 2024-07-22 NOTE — Progress Notes (Signed)
 Katherine Larson    [id number]    1946-03-02  Primary Care Physician:White, Montie, MD  Referring Physician: Teresa Montie, MD [phone] [address],  [place] [address]  Chief complaint: Follow-up for dyspnea  HPI: 78 y.o.  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  She was diagnosed with COVID-19 pneumonia on 01/09/2023 which was treated as an outpatient with Paxlovid but then had worsening dyspnea and was hospitalized hospitalized on 02/07/2023 with acute respiratory failure with bilateral multilobar pneumonia, Pseudomonas bacteremia, sepsis with elevated troponins.  Admitted and required BiPAP.  Treated with antibiotics, IV fluids  Post discharge she has made some recovery but continues to have symptoms of dyspnea.  Denies any cough, sputum production, fevers or chills  She has a history of rheumatoid arthritis, managed by Dr. Jon Learn. She is on Arava  for this condition.   Interim history: Discussed the use of AI scribe software for clinical note transcription with the patient, who gave verbal consent to proceed.  History of Present Illness Katherine Larson is a 78 year old female with post-COVID lung scarring who presents with low oxygen  levels following knee surgery.  Underwent total knee arthroplasty on right on 06/28/2024  Hypoxemia and dyspnea on exertion - Low oxygen  saturation episodes since knee surgery on August 11th, 2025 - Oxygen  saturation dropped to 33% during physical therapy session - Oxygen  levels return to baseline after resting - Requires supplemental oxygen  at night at 4 liters - Does not typically use oxygen  during the day except with exertion  Lower extremity edema and ecchymosis - Right bruising and swelling in the leg  extending to the foot - No known trauma or injury to the area - Currently taking aspirin  325 mg twice daily - Family concerned about possible blood clot.  She is scheduled to follow-up with her orthopedic doctor today.  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 Learn  Respiratory medication use and efficacy - Uses Trelegy as a controller inhaler for respiratory symptoms - Finds Symbicort  to be more effective than Trelegy - Uses ProAir  as a rescue inhaler   Relevant pulmonary history Pets: No pets Occupation: Retired Garment/textile technologist Exposures: No mold, hot tub, Jacuzzi.  No feather pillows or comforters Smoking history: For 15 pack years.  Quit in 1996 Travel history: No significant travel history Relevant family history: No family history of lung disease  Outpatient Encounter Medications as of 07/22/2024  Medication Sig   albuterol  (VENTOLIN  HFA) 108 (90 Base) MCG/ACT inhaler Inhale 1-2 puffs into the lungs every 6 (six) hours as needed for wheezing or shortness of breath.   allopurinol  (ZYLOPRIM ) 300 MG tablet Take 300 mg by mouth daily.   aspirin  EC 325 MG tablet Take 1 tablet (325 mg total) by mouth 2 (two) times daily.   atorvastatin  (LIPITOR ) 80 MG tablet Take 80 mg by mouth daily.   carvedilol  (COREG ) 6.25 MG tablet Take 1 tablet (6.25 mg total) by mouth 2 (two) times daily with a meal.   cholecalciferol (VITAMIN D3) 25 MCG (1000 UT) tablet Take  1,000 Units by mouth daily.   ciprofloxacin  (CIPRO ) 250 MG tablet Take 250 mg by mouth 2 (two) times daily.   docusate sodium  (COLACE) 100 MG capsule Take 1 capsule (100 mg total) by mouth 2 (two) times daily.   etanercept (ENBREL SURECLICK) 50 MG/ML injection Inject 50 mg into the skin every Monday.   Evolocumab  (REPATHA   SURECLICK) 140 MG/ML SOAJ Inject 140 mg into the skin every 14 (fourteen) days.   FARXIGA  10 MG TABS tablet TAKE 1 TABLET(10 MG) BY MOUTH DAILY BEFORE BREAKFAST   ferrous sulfate 325 (65 FE) MG tablet Take 325 mg by mouth daily with breakfast.   fexofenadine (ALLEGRA) 180 MG tablet Take 180 mg by mouth daily.   fluticasone  (FLONASE ) 50 MCG/ACT nasal spray Place 1 spray into both nostrils daily.   furosemide  (LASIX ) 40 MG tablet Take 0.5 tablets (20 mg total) by mouth daily.   gabapentin  (NEURONTIN ) 300 MG capsule Take 300 mg by mouth 2 (two) times daily.   lansoprazole  (PREVACID ) 30 MG capsule Take 1 capsule (30 mg total) by mouth daily before breakfast. (Patient taking differently: Take 30 mg by mouth 2 (two) times daily before a meal.)   leflunomide  (ARAVA ) 10 MG tablet Take 10 mg by mouth daily.   MAGNESIUM -OXIDE 400 (240 Mg) MG tablet TAKE 1 TABLET(400 MG) BY MOUTH DAILY   meclizine  (ANTIVERT ) 12.5 MG tablet Take 12.5 mg by mouth 3 (three) times daily as needed for dizziness.   montelukast  (SINGULAIR ) 10 MG tablet Take 10 mg by mouth at bedtime.   Multiple Vitamins-Minerals (PRESERVISION AREDS 2) CAPS Take 1 capsule by mouth 2 (two) times daily.   nitroGLYCERIN  (NITROSTAT ) 0.4 MG SL tablet DISSOLVE 1 TABLET UNDER THE  TONGUE EVERY 5 MINUTES AS NEEDED FOR CHEST PAIN. MAX OF 3 TABLETS IN 15 MINUTES. CALL 911 IF PAIN  PERSISTS.   oxyCODONE -acetaminophen  (PERCOCET) 10-325 MG tablet Take 1 tablet by mouth in the morning, at noon, in the evening, and at bedtime.   sacubitril -valsartan  (ENTRESTO ) 24-26 MG Take 1 tablet by mouth 2 (two) times daily.   spironolactone  (ALDACTONE ) 25 MG tablet Take 0.5 tablets (12.5 mg total) by mouth daily.   tiZANidine  (ZANAFLEX ) 2 MG tablet Take 1 tablet (2 mg total) by mouth every 6 (six) hours as needed.   TRELEGY ELLIPTA 200-62.5-25 MCG/ACT AEPB Inhale 1 puff into the lungs daily.   VASCEPA  1 g capsule TAKE 1 CAPSULE(1 GRAM) BY MOUTH TWICE DAILY   vitamin B-12  (CYANOCOBALAMIN ) 250 MCG tablet Take 250 mcg by mouth daily.   losartan  (COZAAR ) 100 MG tablet Take 1 tablet (100 mg total) by mouth daily. (Patient not taking: Reported on 07/22/2024)   oxyCODONE  (ROXICODONE ) 5 MG immediate release tablet Take 1 tablet (5 mg total) by mouth every 4 (four) hours as needed for severe pain (pain score 7-10). (Patient not taking: Reported on 07/22/2024)   No facility-administered encounter medications on file as of 07/22/2024.   Vitals:   07/22/24 1028  BP: 130/74  Pulse: 77  Temp: 98 F (36.7 C)  Height: 5' 2 (1.575 m)  Weight: 126 lb (57.2 kg)  SpO2: 99%  TempSrc: Temporal  BMI (Calculated): 23.04     Physical Exam GEN: No acute distress. CV: Regular rate and rhythm, no murmurs. LUNGS: Clear to auscultation bilaterally, normal respiratory effort. SKIN JOINTS: Warm and dry, no rash.    Data Reviewed: Imaging: CTA 10/05/2022-no PE, irregular nodule in the right upper lobe, peribronchovascular nodularity  Chest x-ray 02/18/2023-improving left basal  airspace disease.  CT high-resolution 05/29/2023-no interstitial lung disease, ill-defined nodularity and groundglass in the right upper lobe  CT chest 12/07/2023-improvement in right upper lobe peribronchovascular nodularity, mild scarring in the lingula. I have reviewed reviewed the images personally.  Chest x-ray report Care Everywhere 05/28/2024 Minimal right basilar subsegmental atelectasis or scarring.   PFTs: 06/06/2023 FVC 2.04 [77%], FEV1 1.67 [84%], F/F82, TLC 3.92 [81%], DLCO 14.83 [81%] Normal test, variability in flow loop  Labs:  Sleep: Overnight oximetry on room air dated 04/12/2023 Time off test 9 hours 26 minutes Nadir O2 sat of 82% Time spent less than 88% 5 hours 5 minutes Assessment & Plan Hypoxemia requiring supplemental oxygen  Intermittent hypoxemia during exertion with oxygen  saturation dropping to 33%. Currently on 4 liters of oxygen  at night. No daytime oxygen  use, but episodes  occur with exertion. Recent knee surgery may contribute to hypoxemia due to potential blood clot formation. - Order 4 liters of oxygen  for daytime use during exertion - Prescribe smaller portable oxygen  tanks for easier mobility - Order CT of the chest with contrast to evaluate for pulmonary embolism and assess lung scarring - Scheduled for follow-up with orthopedic doctor today  Pulmonary scarring from prior COVID-19 infection Post-COVID multilobar pneumonia, Pseudomonas bacteremia, sepsis in 2024 Pulmonary scarring in the left lingula, likely post-inflammatory from prior COVID-19 infection. No evidence of interstitial lung disease on prior imaging. Symptoms may be exacerbated by recent knee surgery and potential blood clot. - Order CT of the chest with contrast to assess for changes in pulmonary scarring - Previously on Trelegy inhaler but feels that Symbicort  is helping normal.  Minimal right basilar atelectasis Minimal right basilar atelectasis noted on July 9th x-ray, likely due to incomplete lung expansion. No significant changes expected since prior imaging. - Order CT of the chest with contrast to reassess atelectasis  Nocturnal hypoxia Overnight oximetry with low oxygen  levels at night.  Continue supplemental oxygen  at night  Rheumatoid arthritis Managed by Dr. Jon Learn. Currently on Arava  and Enbrel. No interstitial lung disease on prior CT scan.  Continue monitoring and follow-up scan  Plan/Recommendations: Continue supplemental oxygen  at night, start supplemental oxygen  during daytime CT angiogram to evaluate for pulmonary embolism Change Trelegy to Symbicort   Lonna Coder MD [facility] 07/22/2024, 10:34 AM  CC: Teresa Channel, MD

## 2024-07-24 ENCOUNTER — Ambulatory Visit: Payer: Self-pay | Admitting: Pulmonary Disease

## 2024-07-24 ENCOUNTER — Encounter (HOSPITAL_COMMUNITY): Payer: Self-pay

## 2024-07-24 ENCOUNTER — Other Ambulatory Visit: Payer: Self-pay

## 2024-07-24 ENCOUNTER — Emergency Department (HOSPITAL_COMMUNITY)

## 2024-07-24 ENCOUNTER — Ambulatory Visit (HOSPITAL_COMMUNITY)

## 2024-07-24 ENCOUNTER — Emergency Department (HOSPITAL_COMMUNITY)
Admission: EM | Admit: 2024-07-24 | Discharge: 2024-07-24 | Disposition: A | Discharge status: Home / Self Care | Attending: Emergency Medicine | Admitting: Emergency Medicine

## 2024-07-24 ENCOUNTER — Telehealth: Payer: Self-pay | Admitting: Pulmonary Disease

## 2024-07-24 DIAGNOSIS — R5383 Other fatigue: Secondary | ICD-10-CM | POA: Diagnosis not present

## 2024-07-24 DIAGNOSIS — R109 Unspecified abdominal pain: Secondary | ICD-10-CM | POA: Insufficient documentation

## 2024-07-24 DIAGNOSIS — D649 Anemia, unspecified: Secondary | ICD-10-CM | POA: Diagnosis not present

## 2024-07-24 DIAGNOSIS — R3915 Urgency of urination: Secondary | ICD-10-CM | POA: Insufficient documentation

## 2024-07-24 DIAGNOSIS — Z79899 Other long term (current) drug therapy: Secondary | ICD-10-CM | POA: Diagnosis not present

## 2024-07-24 DIAGNOSIS — I129 Hypertensive chronic kidney disease with stage 1 through stage 4 chronic kidney disease, or unspecified chronic kidney disease: Secondary | ICD-10-CM | POA: Insufficient documentation

## 2024-07-24 DIAGNOSIS — R350 Frequency of micturition: Secondary | ICD-10-CM | POA: Diagnosis not present

## 2024-07-24 DIAGNOSIS — I2510 Atherosclerotic heart disease of native coronary artery without angina pectoris: Secondary | ICD-10-CM | POA: Diagnosis not present

## 2024-07-24 DIAGNOSIS — Z7982 Long term (current) use of aspirin: Secondary | ICD-10-CM | POA: Diagnosis not present

## 2024-07-24 DIAGNOSIS — R300 Dysuria: Secondary | ICD-10-CM | POA: Diagnosis not present

## 2024-07-24 DIAGNOSIS — N189 Chronic kidney disease, unspecified: Secondary | ICD-10-CM | POA: Diagnosis not present

## 2024-07-24 DIAGNOSIS — N3000 Acute cystitis without hematuria: Secondary | ICD-10-CM | POA: Diagnosis not present

## 2024-07-24 DIAGNOSIS — N281 Cyst of kidney, acquired: Secondary | ICD-10-CM | POA: Diagnosis not present

## 2024-07-24 DIAGNOSIS — K5730 Diverticulosis of large intestine without perforation or abscess without bleeding: Secondary | ICD-10-CM | POA: Diagnosis not present

## 2024-07-24 LAB — CBC WITH DIFFERENTIAL/PLATELET
Abs Immature Granulocytes: 0.03 K/uL (ref 0.00–0.07)
Basophils Absolute: 0 K/uL (ref 0.0–0.1)
Basophils Relative: 1 %
Eosinophils Absolute: 0.3 K/uL (ref 0.0–0.5)
Eosinophils Relative: 4 %
HCT: 28 % — ABNORMAL LOW (ref 36.0–46.0)
Hemoglobin: 8.4 g/dL — ABNORMAL LOW (ref 12.0–15.0)
Immature Granulocytes: 0 %
Lymphocytes Relative: 41 %
Lymphs Abs: 2.9 K/uL (ref 0.7–4.0)
MCH: 33.3 pg (ref 26.0–34.0)
MCHC: 30 g/dL (ref 30.0–36.0)
MCV: 111.1 fL — ABNORMAL HIGH (ref 80.0–100.0)
Monocytes Absolute: 0.7 K/uL (ref 0.1–1.0)
Monocytes Relative: 10 %
Neutro Abs: 3.1 K/uL (ref 1.7–7.7)
Neutrophils Relative %: 44 %
Platelets: 256 K/uL (ref 150–400)
RBC: 2.52 MIL/uL — ABNORMAL LOW (ref 3.87–5.11)
RDW: 17.3 % — ABNORMAL HIGH (ref 11.5–15.5)
Smear Review: NORMAL
WBC: 7.1 K/uL (ref 4.0–10.5)
nRBC: 0 % (ref 0.0–0.2)

## 2024-07-24 LAB — URINALYSIS, ROUTINE W REFLEX MICROSCOPIC
Bacteria, UA: NONE SEEN
Bilirubin Urine: NEGATIVE
Glucose, UA: >=500 mg/dL — AB
Hgb urine dipstick: NEGATIVE
Ketones, ur: NEGATIVE mg/dL
Nitrite: NEGATIVE
Protein, ur: NEGATIVE mg/dL
Specific Gravity, Urine: 1.018 (ref 1.005–1.030)
pH: 5 (ref 5.0–8.0)

## 2024-07-24 LAB — BASIC METABOLIC PANEL WITH GFR
Anion gap: 10 (ref 5–15)
BUN: 29 mg/dL — ABNORMAL HIGH (ref 8–23)
CO2: 24 mmol/L (ref 22–32)
Calcium: 8.2 mg/dL — ABNORMAL LOW (ref 8.9–10.3)
Chloride: 103 mmol/L (ref 98–111)
Creatinine, Ser: 1.2 mg/dL — ABNORMAL HIGH (ref 0.44–1.00)
GFR, Estimated: 47 mL/min — ABNORMAL LOW (ref >60)
Glucose, Bld: 113 mg/dL — ABNORMAL HIGH (ref 70–99)
Potassium: 4.8 mmol/L (ref 3.5–5.1)
Sodium: 137 mmol/L (ref 135–145)

## 2024-07-24 MED ORDER — PHENAZOPYRIDINE HCL 100 MG PO TABS
200.0000 mg | ORAL_TABLET | Freq: Once | ORAL | Status: AC
Start: 2024-07-24 — End: 2024-07-24
  Administered 2024-07-24: 200 mg via ORAL
  Filled 2024-07-24: qty 2

## 2024-07-24 MED ORDER — ACETAMINOPHEN 500 MG PO TABS
1000.0000 mg | ORAL_TABLET | Freq: Once | ORAL | Status: AC
  Administered 2024-07-24: 1000 mg via ORAL
  Filled 2024-07-24: qty 2

## 2024-07-24 MED ORDER — ACETAMINOPHEN 500 MG PO TABS: 500.0000 mg | ORAL_TABLET | Freq: Four times a day (QID) | ORAL | 0 refills | Status: AC | PRN

## 2024-07-24 NOTE — Telephone Encounter (Signed)
 According to the chart, It looks like it' a increase in liter flow.

## 2024-07-24 NOTE — ED Provider Notes (Signed)
  Physical Exam  BP (!) 174/65   Pulse 79   Temp 98.9 F (37.2 C) (Oral)   Resp 18   Ht 5' 2 (1.575 m)   Wt 57.2 kg   SpO2 97%   BMI 23.05 kg/m   Physical Exam  Procedures  Procedures  ED Course / MDM    Medical Decision Making Amount and/or Complexity of Data Reviewed Labs: ordered. Radiology: ordered.  Risk OTC drugs. Prescription drug management.   Signout was taken from [name], PA-C.  Patient was seen today for evaluation of persistent urinary symptoms.  She has been on ciprofloxacin  and is still having symptoms.  Signed out to me at this time pending CT scan to rule out possible stone.  CT shows no kidney stone, diverticulosis without diverticulitis, large amount of stool in the colon and aortic atherosclerosis.  I discussed these findings with patient and her daughter.  She notes she has been having regular bowel movements and is not feeling constipated.  We discussed that she does have small amount of leukocytes and 6-10 white blood cells.  Discussed to call her kidney doctor tomorrow who had apparently prescribed doxycycline  to see if they want her to continue the Cipro  since urine appears to be improved or if they want to switch to doxycycline .  She does not meet any SIRS criteria.  She is feeling better at this time and requesting to go home.  Results concerning for anemia.  Hemoglobin today 8.5 which is the lowest it has been.  Patient denied any frank GI bleeding but is legally blind and could not be certain.  No exam was performed by PA student [name], and GI bleeding and this revealed brown stool and was guaiac negative.  Patient gets IV iron  infusion at the cancer center.  Advised to follow-up with her doctor for this anemia.  Advised to come back if she is dizzy, weak, has palpitations, chest pain shortness of breath, worsening fatigue or other worrisome symptoms to suggest worsening anemia.     Katherine Larson 07/24/24 2043    [name], MD 07/25/24 [phone]

## 2024-07-24 NOTE — ED Notes (Signed)
 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

## 2024-07-24 NOTE — ED Triage Notes (Signed)
 Pt arrived via POV from home c/o recurrent UTI. Pt reports dysuria began last week. Pt reports calling her kidney doctor and they called her antibiotic prescription in this Tuesday, but pain has only worsened.

## 2024-07-24 NOTE — ED Provider Notes (Signed)
 [facility] Provider Note   CSN: [id number] Arrival date & time: 07/24/24  1359     Patient presents with: Recurrent UTI   Katherine Larson is a 78 y.o. female with a history including CAD, rheumatoid arthritis, hypertension, hyperlipidemia, chronic renal insufficiency along with chronic functional iron  deficiency under the care of of hematology receiving intermittent iron  infusions, history of lymphocytic colitis who is approximately 3 weeks out from a right knee arthroplasty presenting for urinary complaints.  She is describing increased urinary frequency along with urgency and dysuria with urination.  She was seen by an urgent care 2 days ago and was placed on Cipro , she has had 4 tablets, including 1 this morning and endorses worsening rather than improving discomfort.  She now has pain that radiates through her bilateral lower back region.  She had also reached out to her kidney specialist and they prescribed her doxycycline  yesterday, but patient has not yet picked this medicine up from her pharmacy.  She denies fevers or chills, she does endorse generalized fatigue.  She cannot comment on the appearance of her urine she has poor vision at baseline, unsure if she has hematuria.     The history is provided by the patient.       Prior to Admission medications   Medication Sig Start Date End Date Taking? Authorizing Provider  acetaminophen  (TYLENOL ) 500 MG tablet Take 1 tablet (500 mg total) by mouth every 6 (six) hours as needed. 07/24/24  Yes Beatty, Celeste A, PA-C  albuterol  (VENTOLIN  HFA) 108 (90 Base) MCG/ACT inhaler Inhale 1-2 puffs into the lungs every 6 (six) hours as needed for wheezing or shortness of breath. 03/01/24   Enedelia Dorna HERO, FNP  allopurinol  (ZYLOPRIM ) 300 MG tablet Take 300 mg by mouth daily.    [provider]  aspirin  EC 325 MG tablet Take 1 tablet (325 mg total) by mouth 2 (two) times daily. 07/01/24    Orlando Camellia POUR, PA-C  atorvastatin  (LIPITOR ) 80 MG tablet Take 80 mg by mouth daily.    [provider]  budesonide -formoterol  (SYMBICORT ) 160-4.5 MCG/ACT inhaler Inhale 2 puffs into the lungs in the morning and at bedtime. 07/22/24   Mannam, Praveen, MD  carvedilol  (COREG ) 6.25 MG tablet Take 1 tablet (6.25 mg total) by mouth 2 (two) times daily with a meal. 04/24/24   Milford, Harlene HERO, FNP  cholecalciferol (VITAMIN D3) 25 MCG (1000 UT) tablet Take 1,000 Units by mouth daily.    [provider]  ciprofloxacin  (CIPRO ) 250 MG tablet Take 250 mg by mouth 2 (two) times daily. 07/21/24   [provider]  docusate sodium  (COLACE) 100 MG capsule Take 1 capsule (100 mg total) by mouth 2 (two) times daily. 07/01/24 07/01/25  Orlando Camellia POUR, PA-C  etanercept (ENBREL SURECLICK) 50 MG/ML injection Inject 50 mg into the skin every Monday. 07/17/22   [provider]  Evolocumab  (REPATHA  SURECLICK) 140 MG/ML SOAJ Inject 140 mg into the skin every 14 (fourteen) days. 06/05/24   Rolan Ezra RAMAN, MD  FARXIGA  10 MG TABS tablet TAKE 1 TABLET(10 MG) BY MOUTH DAILY BEFORE BREAKFAST 04/16/24   McLean, Dalton S, MD  ferrous sulfate 325 (65 FE) MG tablet Take 325 mg by mouth daily with breakfast.    [provider]  fexofenadine (ALLEGRA) 180 MG tablet Take 180 mg by mouth daily.    [provider]  fluticasone  (FLONASE ) 50 MCG/ACT nasal spray Place 1 spray into both  nostrils daily. 04/18/23   [provider]  furosemide  (LASIX ) 40 MG tablet Take 0.5 tablets (20 mg total) by mouth daily. 03/17/24   Rolan Ezra RAMAN, MD  gabapentin  (NEURONTIN ) 300 MG capsule Take 300 mg by mouth 2 (two) times daily.    [provider]  lansoprazole  (PREVACID ) 30 MG capsule Take 1 capsule (30 mg total) by mouth daily before breakfast. Patient taking differently: Take 30 mg by mouth 2 (two) times daily before a meal. 04/15/24   Ezzard Sonny RAMAN, PA-C  leflunomide  (ARAVA ) 10 MG  tablet Take 10 mg by mouth daily.    [provider]  losartan  (COZAAR ) 100 MG tablet Take 1 tablet (100 mg total) by mouth daily. Patient not taking: Reported on 07/22/2024 06/12/24   Bensimhon, Toribio SAUNDERS, MD  MAGNESIUM -OXIDE 400 (240 Mg) MG tablet TAKE 1 TABLET(400 MG) BY MOUTH DAILY 12/12/21   Delford Maude BROCKS, MD  meclizine  (ANTIVERT ) 12.5 MG tablet Take 12.5 mg by mouth 3 (three) times daily as needed for dizziness.    [provider]  montelukast  (SINGULAIR ) 10 MG tablet Take 10 mg by mouth at bedtime.    [provider]  Multiple Vitamins-Minerals (PRESERVISION AREDS 2) CAPS Take 1 capsule by mouth 2 (two) times daily.    [provider]  nitroGLYCERIN  (NITROSTAT ) 0.4 MG SL tablet DISSOLVE 1 TABLET UNDER THE  TONGUE EVERY 5 MINUTES AS NEEDED FOR CHEST PAIN. MAX OF 3 TABLETS IN 15 MINUTES. CALL 911 IF PAIN  PERSISTS. 01/10/22   Waddell Danelle ORN, MD  oxyCODONE  (ROXICODONE ) 5 MG immediate release tablet Take 1 tablet (5 mg total) by mouth every 4 (four) hours as needed for severe pain (pain score 7-10). Patient not taking: Reported on 07/22/2024 07/01/24   Orlando Camellia POUR, PA-C  oxyCODONE -acetaminophen  (PERCOCET) 10-325 MG tablet Take 1 tablet by mouth in the morning, at noon, in the evening, and at bedtime.    [provider]  sacubitril -valsartan  (ENTRESTO ) 24-26 MG Take 1 tablet by mouth 2 (two) times daily. 03/17/24   Rolan Ezra RAMAN, MD  spironolactone  (ALDACTONE ) 25 MG tablet Take 0.5 tablets (12.5 mg total) by mouth daily. 07/08/24   Rolan Ezra RAMAN, MD  tiZANidine  (ZANAFLEX ) 2 MG tablet Take 1 tablet (2 mg total) by mouth every 6 (six) hours as needed. 07/01/24   Orlando Camellia POUR, PA-C  VASCEPA  1 g capsule TAKE 1 CAPSULE(1 GRAM) BY MOUTH TWICE DAILY 02/18/24   McLean, Dalton S, MD  vitamin B-12 (CYANOCOBALAMIN ) 250 MCG tablet Take 250 mcg by mouth daily.    [provider]    Allergies: Biaxin [clarithromycin], Penicillins, Hydroxychloroquine ,  Sulfamethoxazole, Sulfasalazine, and Losartan  potassium    Review of Systems  Constitutional:  Negative for chills and fever.  HENT:  Negative for congestion and sore throat.   Eyes: Negative.   Respiratory:  Negative for chest tightness and shortness of breath.   Cardiovascular:  Negative for chest pain.  Gastrointestinal:  Negative for abdominal pain and nausea.  Genitourinary:  Positive for dysuria, flank pain and urgency.  Musculoskeletal:  Negative for arthralgias, joint swelling and neck pain.  Skin: Negative.  Negative for rash and wound.  Neurological:  Negative for dizziness, weakness, light-headedness, numbness and headaches.  Psychiatric/Behavioral: Negative.      Updated Vital Signs BP (!) 174/65   Pulse 79   Temp 98.9 F (37.2 C) (Oral)   Resp 18   Ht 5' 2 (1.575 m)   Wt 57.2 kg   SpO2  97%   BMI 23.05 kg/m   Physical Exam Vitals and nursing note reviewed.  Constitutional:      Appearance: She is well-developed.  HENT:     Head: Normocephalic and atraumatic.  Eyes:     Conjunctiva/sclera: Conjunctivae normal.  Cardiovascular:     Rate and Rhythm: Normal rate and regular rhythm.     Heart sounds: Normal heart sounds.  Pulmonary:     Effort: Pulmonary effort is normal.     Breath sounds: Normal breath sounds. No wheezing.  Abdominal:     General: Bowel sounds are normal.     Palpations: Abdomen is soft.     Tenderness: There is abdominal tenderness in the suprapubic area.  Musculoskeletal:        General: Normal range of motion.     Cervical back: Normal range of motion.  Skin:    General: Skin is warm and dry.  Neurological:     Mental Status: She is alert.     (all labs ordered are listed, but only abnormal results are displayed) Labs Reviewed  URINALYSIS, ROUTINE W REFLEX MICROSCOPIC - Abnormal; Notable for the following components:      Result Value   Glucose, UA >=500 (*)    Leukocytes,Ua SMALL (*)    All other components within normal  limits  BASIC METABOLIC PANEL WITH GFR - Abnormal; Notable for the following components:   Glucose, Bld 113 (*)    BUN 29 (*)    Creatinine, Ser 1.20 (*)    Calcium  8.2 (*)    GFR, Estimated 47 (*)    All other components within normal limits  CBC WITH DIFFERENTIAL/PLATELET - Abnormal; Notable for the following components:   RBC 2.52 (*)    Hemoglobin 8.4 (*)    HCT 28.0 (*)    MCV 111.1 (*)    RDW 17.3 (*)    All other components within normal limits  POC OCCULT BLOOD, ED    EKG: None  Radiology: CT Renal Stone Study Result Date: 07/24/2024 CLINICAL DATA:  Flank and abdominal pain EXAM: CT ABDOMEN AND PELVIS WITHOUT CONTRAST TECHNIQUE: Multidetector CT imaging of the abdomen and pelvis was performed following the standard protocol without IV contrast. RADIATION DOSE REDUCTION: This exam was performed according to the departmental dose-optimization program which includes automated exposure control, adjustment of the mA and/or kV according to patient size and/or use of iterative reconstruction technique. COMPARISON:  CT abdomen and pelvis 12/15/2022 FINDINGS: Lower chest: No acute abnormality. Hepatobiliary: No focal liver abnormality is seen. Status post cholecystectomy. No biliary dilatation. Pancreas: Unremarkable. No pancreatic ductal dilatation or surrounding inflammatory changes. Spleen: Normal in size without focal abnormality. Adrenals/Urinary Tract: There is a 16 mm cyst in the posterior right kidney. Otherwise, the kidneys, adrenal glands and bladder are within normal limits. Stomach/Bowel: Stomach is within normal limits. No evidence of bowel wall thickening, distention, or inflammatory changes. The appendix is not seen. There is descending and sigmoid colon diverticulosis. There is a large amount of stool throughout the colon. Vascular/Lymphatic: There are atherosclerotic calcifications of the aorta and branch vessels. Aorta is normal in size. No enlarged lymph nodes are seen.  Reproductive: Status post hysterectomy. No adnexal masses. Other: No abdominal wall hernia or abnormality. No abdominopelvic ascites. Musculoskeletal: Thoracic spinal cord stimulator device present. Sternotomy wires are present. There is lumbar fusion hardware at L3-L4. IMPRESSION: 1. No acute localizing process in the abdomen or pelvis. 2. Colonic diverticulosis without evidence for diverticulitis. 3. Large amount of  stool throughout the colon. 4. Aortic atherosclerosis. Aortic Atherosclerosis (ICD10-I70.0). Electronically Signed   By: Greig Pique M.D.   On: 07/24/2024 19:26     Procedures   Medications Ordered in the ED  phenazopyridine  (PYRIDIUM ) tablet 200 mg (200 mg Oral Given 07/24/24 1546)  acetaminophen  (TYLENOL ) tablet 1,000 mg (1,000 mg Oral Given 07/24/24 1923)                                    Medical Decision Making Patient presenting with worsening dysuria despite day 3 of Cipro  for UTI.  Pain rating into her bilateral lower back without localizing CVA tenderness to either kidney.  Differential diagnosis including resistant UTI, kidney stone, pyelonephritis, abscess.  Patient discussed with Sherran Sarah, PA-C who assumes care.  Would recommend discharge home with plans to complete course of Cipro  if CT imaging is negative.  She was given a dose of Pyridium  here to help her with dysuria symptoms.    Amount and/or Complexity of Data Reviewed Labs: ordered.    Details: Labs reviewed, she has a normal WBC count at 7.1.  Her hemoglobin is 8.4 today, she does have chronic iron  deficiency anemia under the care of of hematology with occasional need of iron  infusions.  Review of chart indicates her hemoglobins generally range in the 9-10 range.  Of note, she is 3 weeks out from right knee arthroplasty, possibly the reason for this result today.  She denies blood in her stools.  Urinalysis is negative except for 6-10 WBCs.  Creatinine is 1.20 with a BUN of 29, this creatinine is improved  from 1.342 weeks ago. Radiology: ordered.    Details: CT renal study pending.  Risk Prescription drug management.        Final diagnoses:  None    ED Discharge Orders          Ordered    acetaminophen  (TYLENOL ) 500 MG tablet  Every 6 hours PRN        07/24/24 1908               Demtrius Rounds, PA-C 07/24/24 1929    [name], MD 07/25/24 [phone]

## 2024-07-24 NOTE — Discharge Instructions (Addendum)
 Seen today for urinary tract infection.  Your urine seems to be improving but your hemoglobin is lower than usual.  There is no sign of blood in your stool.  It is important for you to follow-up closely with your PCP and kidney doctor.  They need to closely recheck your hemoglobin and see if they need to give you another iron  infusion.  Please call your kidney specialist tomorrow.  Since they sent you in a new antibiotic, have them look at your urine from today and see if they want you to continue the Cipro  or switch to the doxycycline .

## 2024-07-24 NOTE — ED Notes (Signed)
Assisted to BR for urine sample

## 2024-07-24 NOTE — Telephone Encounter (Signed)
 Thank you! I have sent the information to [employer]. NFN

## 2024-07-24 NOTE — ED Notes (Signed)
 Patient transported to CT

## 2024-07-24 NOTE — ED Notes (Signed)
 Pt returned from CT, reported that unable to do CT due to pain stimulator.  Asked Husband in the room to turn off the stimulator.

## 2024-07-24 NOTE — Telephone Encounter (Signed)
 Per Avelina at [employer]- PT has a 10L concentrator r as of 07/17/2024. Please confirm what the order is for.

## 2024-07-24 NOTE — ED Notes (Signed)
 CT called to inform that pain stimulator has been turned off

## 2024-07-26 LAB — URINE CULTURE: Culture: <10000 — AB

## 2024-07-28 ENCOUNTER — Telehealth: Payer: Self-pay | Admitting: Pulmonary Disease

## 2024-07-28 ENCOUNTER — Telehealth: Payer: Self-pay

## 2024-07-28 DIAGNOSIS — J9610 Chronic respiratory failure, unspecified whether with hypoxia or hypercapnia: Secondary | ICD-10-CM

## 2024-07-28 NOTE — Telephone Encounter (Signed)
 Pt called stating that she was seen recently in ER at [facility] and was having trouble with her bowels. Pt stated that she was instructed to take colace due to not having adequate bowel movements. Pt called asking what else she can do. Instructed pt to take miralax  twice daily until she has a soft stool then to back it down to once a day. Instructed pt to call if that did not help. Pt verbalized understanding.

## 2024-07-28 NOTE — Telephone Encounter (Signed)
 Noted

## 2024-07-28 NOTE — Telephone Encounter (Signed)
 Reviewed ed records. Presented with concerns for persistent urinary symptoms on cipro .   CT renal stone study showed large amt of stool throughout colon.   Hgb down from 10 to 8.4. no reported overt gi bleeding. Stool was brown heme negative on exam in ED.  She was advised to follow up with hematology, she has appt later this month.   Agree with advise for miralax .   We will follow her labs next month with hematology before deciding if any gi work up needed again.

## 2024-07-28 NOTE — Telephone Encounter (Signed)
 Copied from CRM [id number]. Topic: Clinical - Order For Equipment >> Jul 28, 2024  2:19 PM Chantha C wrote: Reason for CRM: Patient's spouse Elsie [phone] checking on patient portable oxygen  tank and has not heard from [employer] [facility]. Elsie will contact [employer] [facility] because patient need the portable tank when going to the doctor appointments. Patient has oxygen  machine at home. Please advise and call back.

## 2024-07-29 ENCOUNTER — Encounter (HOSPITAL_COMMUNITY): Payer: Self-pay

## 2024-07-29 ENCOUNTER — Ambulatory Visit (HOSPITAL_COMMUNITY)

## 2024-07-29 DIAGNOSIS — Z7409 Other reduced mobility: Secondary | ICD-10-CM

## 2024-07-29 DIAGNOSIS — M25561 Pain in right knee: Secondary | ICD-10-CM

## 2024-07-29 NOTE — Telephone Encounter (Signed)
 Routing to [facility],  Last encounter 9/4 message was being sent back to [employer].  Has this been handled?

## 2024-07-29 NOTE — Telephone Encounter (Signed)
 Last DME order was for liter increase if need POC please send new order

## 2024-07-29 NOTE — Therapy (Signed)
 OUTPATIENT PHYSICAL THERAPY LOWER EXTREMITY TREATMENT   Patient Name: JAYLEIGH NOTARIANNI MRN: [id number] DOB:08-07-1946, 78 y.o., female Today's Date: 07/29/2024  END OF SESSION:  PT End of Session - 07/29/24 1102     Visit Number 2    Number of Visits 12    Date for PT Re-Evaluation 08/28/24    Authorization Type [employer] MEDICARE    Authorization Time Period uhc covered 10 visits from 07/17/24-08/21/2024    Authorization - Visit Number 1    Authorization - Number of Visits 10    Progress Note Due on Visit 10    PT Start Time 1104    PT Stop Time 1144    PT Time Calculation (min) 40 min    Activity Tolerance Patient tolerated treatment well    Behavior During Therapy WFL for tasks assessed/performed          Past Medical History:  Diagnosis Date   AICD (automatic cardioverter/defibrillator) present    Anemia    anemia of chronic disease +/- IDA followed by Dr. Gatha. received iron  infusions in the past   Borderline glaucoma    CAD (coronary artery disease)    a. s/p CABG 1996. b. low risk nuc 2011. c. LHC 04/2016 due to drop in EF -> occluded native LAD,  widely patent sequential LIMA-D1-LAD.   Chronic kidney disease    mild stage of kidney disease   Chronic systolic CHF (congestive heart failure) (HCC)    Complication of anesthesia    DM2 (diabetes mellitus, type 2) (HCC)    not on any medicine for this at this time, 05/2016   Dyspnea    with exertion   GERD (gastroesophageal reflux disease)    Gout    History of blood transfusion    History of hiatal hernia    in 20s   History of pneumonia    March, 2016   HLD (hyperlipidemia)    HTN (hypertension)    Inappropriate sinus tachycardia (HCC)    LBBB (left bundle branch block)    a. 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 Dx 04/2016 - EF 25-30%, diffuse HK,  elevated LVEDP, mild MR, mod LAE.   Normocytic anemia 10/17/2021   NSVT (nonsustained ventricular tachycardia) (HCC)    PAT (paroxysmal atrial tachycardia) (HCC)    Pneumonia    PONV (postoperative nausea and vomiting)    after just about every surgery I've had   Rheumatoid arthritis (HCC)    RA- hands   Seasonal allergies    Past Surgical History:  Procedure Laterality Date   ABDOMINAL HYSTERECTOMY     APPENDECTOMY     BACK SURGERY     BIOPSY  04/27/2020   Procedure: BIOPSY;  Surgeon: Shaaron Lamar HERO, MD;  Location: [facility];  Service: Endoscopy;;  ascending colon biopsy   BIV UPGRADE N/A 05/14/2024   Procedure: BIV UPGRADE;  Surgeon: Waddell Danelle ORN, MD;  Location: [facility] INVASIVE CV LAB;  Service: Cardiovascular;  Laterality: N/A;   CARDIAC CATHETERIZATION N/A 05/01/2016   Procedure: Right/Left Heart Cath and Coronary/Graft Angiography;  Surgeon: Alm ORN Clay, MD;  Location: [facility] INVASIVE CV LAB;  Service: Cardiovascular;  Laterality: N/A;   CHOLECYSTECTOMY     COLONOSCOPY  11/2011   Dr. Magod:ext/int hemorrhoids, diverticulosis sigmoid colon and  distal desc colon, mid-desc colon hyperplastic polyps.    COLONOSCOPY WITH PROPOFOL  N/A 04/27/2020   Rourk: Diverticulosis, hemorrhoids, normal terminal ileum.  Random colon biopsies significant for chronic lymphocytic colitis. No repeat due to age.   CORONARY ARTERY BYPASS GRAFT  1996   2 vessels   ESOPHAGOGASTRODUODENOSCOPY  11/2011   Dr. Rosalie: small hiatal hernia, one non-bleeding superficial gastric ulcer, medium-sized diverticulum in area of papilla   ESOPHAGOGASTRODUODENOSCOPY N/A 12/29/2015   Dr. Shaaron: Chronic inactive gastritis, normal esophagus status post dilation, duodenal diverticula   ESOPHAGOGASTRODUODENOSCOPY (EGD) WITH PROPOFOL  N/A 07/24/2019   Dr. Shaaron: Normal esophagus status post empiric dilation, small hiatal hernia, large D2 diverticulum   ESOPHAGOGASTRODUODENOSCOPY (EGD) WITH PROPOFOL  N/A 06/03/2021    Surgeon: Shaaron Lamar HERO, MD; normal esophagus, normal stomach, normal duodenum.  Abnormal hypopharyngeal mucosa bilaterally just distal to the base of the tongue (right side greater than left), overlying mucosa appeared irregular and somewhat verrucous in appearance.  Recommended ENT evaluation.   EYE SURGERY Bilateral    cataract removal   FOOT SURGERY     removal bone spur   FRACTURE SURGERY Right    arm   GIVENS CAPSULE STUDY  11/2012   Dr. Rosalie: minimal gastritis, normal small bowel capsule   HERNIA REPAIR     hiatal hernia   ICD IMPLANT N/A 02/04/2021   Procedure: ICD IMPLANT;  Surgeon: Waddell Danelle ORN, MD;  Location: [facility] INVASIVE CV LAB;  Service: Cardiovascular;  Laterality: N/A;   MALONEY DILATION N/A 07/24/2019   Procedure: AGAPITO DILATION;  Surgeon: Shaaron Lamar HERO, MD;  [facility]: [facility];  Service: Endoscopy;  Laterality: N/A;   MALONEY DILATION N/A 06/03/2021   Procedure: AGAPITO DILATION;  Surgeon: Shaaron Lamar HERO, MD;  Location: [facility];  Service: Endoscopy;  Laterality: N/A;   MAXIMUM ACCESS (MAS)POSTERIOR LUMBAR INTERBODY FUSION (PLIF) 1 LEVEL N/A 08/14/2013   Procedure:  MAXIMUM ACCESS SURGERY(MAS) POSTERIOR LUMBAR INTERBODY FUSION LUMBAR THREE-FOUR ;  Surgeon: Alm GORMAN Molt, MD;  Location: [facility]O OR[facility];  Service: Neurosurgery;  Laterality: N/A;   MAXIMUM ACCESS SURGERY(MAS) POSTERIOR LUMBAR INTERBODY FUSION LUMBAR THREE-FOUR    PTCA     RIGHT/LEFT HEART CATH AND CORONARY ANGIOGRAPHY N/A 03/26/2020   Procedure: RIGHT/LEFT HEART CATH AND CORONARY ANGIOGRAPHY;  Surgeon: Anner Alm ORN, MD;  Location: [facility] INVASIVE CV LAB;  Service: Cardiovascular;  Laterality: N/A;   SPINAL CORD STIMULATOR BATTERY EXCHANGE N/A 01/19/2021   Procedure: Spinal cord stimulator battery change;  Surgeon: Darlis Deatrice GORMAN, MD;  Location: [facility] OR;  Service: Neurosurgery;  Laterality: N/A;   SPINAL CORD STIMULATOR INSERTION N/A 06/16/2016   Procedure: LUMBAR SPINAL CORD STIMULATOR INSERTION;   Surgeon: Deward Fabian, MD;  Location: [facility];  Service: Neurosurgery;  Laterality: N/A;  LUMBAR SPINAL CORD STIMULATOR INSERTION   TEE WITHOUT CARDIOVERSION N/A 02/01/2023   Procedure: TRANSESOPHAGEAL ECHOCARDIOGRAM (TEE);  Surgeon: Debera Jayson MATSU, MD;  Location: [facility] ORS;  Service: Cardiovascular;  Laterality: N/A;   tens  05/2016   TONSILLECTOMY     TOTAL KNEE ARTHROPLASTY Right 06/30/2024   Procedure: ARTHROPLASTY, KNEE, TOTAL;  Surgeon: Yvone Rush, MD;  Location: [facility] ORS;  Service: Orthopedics;  Laterality: Right;  RIGHT TOTAL KNEE ARTHROPLASTY   Patient Active Problem List   Diagnosis Date Noted   Arthritis of right knee 06/30/2024   Primary osteoarthritis of right knee 06/27/2024   Bacteremia 02/01/2023   Pseudomonas sepsis (HCC) 01/30/2023   Acute respiratory failure with hypoxia (HCC) 01/28/2023   Multifocal  pneumonia 01/28/2023   Melena 11/28/2022   Rectal bleeding 11/28/2022   Upper abdominal pain 11/28/2022   Early satiety 07/27/2022   Nausea without vomiting 07/27/2022   Hypotension 02/10/2022   Normocytic anemia 10/17/2021   Lymphocytic colitis 07/02/2020   Coronary artery disease, occlusive: CTO proxLAD 03/26/2020   Abnormal nuclear stress test 03/26/2020   Chronic diarrhea 03/02/2020   NICM- new drop in EF 25-30% 05/03/2016   Acute pulmonary edema (HCC)    LBBB (left bundle branch block) 04/29/2016   Chronic HFrEF (heart failure with reduced ejection fraction) (HCC) 04/29/2016   Abdominal pain, chronic, epigastric 03/07/2016   Septic shock (HCC)    COPD exacerbation (HCC) 01/21/2016   Hyperthyroidism 01/21/2016   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   Hypokalemia 01/19/2016   Hyponatremia 01/19/2016   Diet-controlled diabetes mellitus (HCC) 01/19/2016   CAP (community acquired pneumonia) 01/18/2016   Mucosal abnormality of stomach    Dysphagia    Constipation 12/08/2015   Gastroesophageal reflux disease 12/08/2015   Abnormal CT scan, esophagus 12/08/2015    Esophageal dysphagia 12/08/2015   Abdominal pain, epigastric 12/08/2015   Loss of weight 12/08/2015   Lower abdominal pain 12/08/2015   Back pain 08/26/2015   Headache 08/26/2015   Essential hypertension 08/26/2015   DM type 2 (diabetes mellitus, type 2) (HCC) 08/26/2015   Sinus tachycardia 06/07/2015   Diarrhea 06/07/2015   Preop cardiovascular exam 07/16/2013   Atrial flutter (HCC) 07/16/2013   Anemia 01/08/2013   Leukocytosis 12/17/2012   DYSPNEA 02/02/2010   DIABETES MELLITUS 07/28/2009   Hyperlipidemia 07/28/2009   CHOLECYSTECTOMY, HX OF 07/28/2009   Hx of CABG '96- cath 05/01/16 07/28/2009   HERNIORRHAPHY, HX OF 07/28/2009    PCP: Teresa Channel, MD   REFERRING PROVIDER: Yvone Rush, MD  REFERRING DIAG: s/p rt total knee replacement  THERAPY DIAG:  Right knee pain, unspecified chronicity  Impaired functional mobility, balance, gait, and endurance  Rationale for Evaluation and Treatment: Rehabilitation  ONSET DATE: 06/30/24 RTKA  SUBJECTIVE:   SUBJECTIVE STATEMENT: 07/29/24:  Pt recent visit to ER for swollen bladder, currently on antibiotics.  Has been on oxygen  as needed during the day, relies on oxygen  at night.  No reports of falls.  Rt knee pain sharp and achy constant pain, 4-5/10 pain scale.  Rt lower back and Rt hip increased pain, feels the pain stimulator has moved and increased pain since surgery.  Eval:  Pt states she had the surgery the 11th. Pt states she had home health PT about 6 times and had some issues with the oxygen . Previously she jut wore oxygen  at night. Pt has many ongoing health issues and has been struggling with symptoms of UTI and diarrhea the past few days.   PERTINENT HISTORY: -Been using husbands supplemental O2 since home therapy, had just been using it at night before the surgery PAIN:  Are you having pain? Yes: NPRS scale: 3/10 Pain location: right knee Pain description: ache Aggravating factors: being up on it Relieving  factors: rest elevated  PRECAUTIONS: Fall  RED FLAGS: UTI symptoms during urination   WEIGHT BEARING RESTRICTIONS: WBAT  FALLS:  Has patient fallen in last 6 months? No  LIVING ENVIRONMENT: Lives with: lives with their spouse Lives in: House/apartment Stairs: No Has following equipment at home: Environmental consultant - 2 wheeled  OCCUPATION: retired, Biomedical engineer  PLOF: Requires assistive device for independence, Needs assistance with ADLs, and Needs assistance with gait  PATIENT GOALS: walk faster, decrease the knee pain, improve balance, increase LE  strength  NEXT MD VISIT: Next Tuesday for Oxygen    OBJECTIVE:  Note: Objective measures were completed at Evaluation unless otherwise noted.  DIAGNOSTIC FINDINGS: none  PATIENT SURVEYS:  LEFS : 35/80  COGNITION: Overall cognitive status: Within functional limits for tasks assessed     SENSATION: Light touch: Impaired numbness around incision  EDEMA:  RLE knee and ankle  PALPATION: Tenderness to palpation at posterior knee and incision area.  LOWER EXTREMITY ROM:  Active ROM Right eval Left eval Right 07/29/24  Hip flexion     Hip extension     Hip abduction     Hip adduction     Hip internal rotation     Hip external rotation     Knee flexion 121  130  Knee extension 3 from neutral  3  Ankle dorsiflexion     Ankle plantarflexion     Ankle inversion     Ankle eversion      (Blank rows = not tested)  LOWER EXTREMITY MMT:  MMT Right 07/29/24 Left 07/29/24  Hip flexion 3+ 3/5  Hip extension Cannot tolerate prone, complete sidelying next session. Cannot tolerate prone, complete sidelying next session.  Hip abduction 3/5 3/5  Hip adduction    Hip internal rotation    Hip external rotation    Knee flexion    Knee extension    Ankle dorsiflexion    Ankle plantarflexion    Ankle inversion    Ankle eversion     (Blank rows = not tested)    FUNCTIONAL TESTS:  5 times sit to stand: 16.48 seconds, 94% O2, with  [place] 2 minute walk test: 185 feet, 99% O2 with [place]  GAIT: Distance walked: 200 feet Assistive device utilized: Environmental consultant - 2 wheeled Level of assistance: Modified independence Comments: Pt requires therapist assist to carry supplemental O2 supply, decreased gait speed noted, decreased visual acuity noted, and slight antalgic pattern noted with decreased stance time on RLE although minimal.                                                                                                                                 TREATMENT DATE:  07/29/24: Vitals: O2 sat 99-100% HR 88 room air Reviewed goals Educated importance of HEP compliance  MMT see above  Supine: with wedge and 2 pillows under bed  Quad sets 10x  SAQ on half bolster  Bridge 10x5 AROM 3-130 degrees Standing:  SLS unable   Heel raises 10x  Toe raises 10x Vitals:  97%, HR 85 bpm  07/17/2024  Vitals: O2 sat: 94% O2 after 5TSTS Evaluation: -ROM measured, Strength assessed, HEP prescribed, pt educated on prognosis, findings, and importance of HEP compliance if given.     PATIENT EDUCATION:  Education details: Pt was educated on findings of PT evaluation, prognosis, frequency of therapy visits and rationale, attendance policy, and HEP if given.   Person educated: Patient and Spouse Education method:  Explanation, Verbal cues, and Handouts Education comprehension: verbalized understanding, verbal cues required, and needs further education  HOME EXERCISE PROGRAM: Access Code: [email or web address] URL: [email or web address] Date: 07/17/2024 Prepared by: Lang Ada  Exercises - Seated Knee Extension AROM  - 1 x daily - 7 x weekly - 3 sets - 10 reps - Sit to Stand with Armchair  - 1 x daily - 7 x weekly - 3 sets - 10 reps - Heel Raises with Counter Support  - 1 x daily - 7 x weekly - 3 sets - 10 reps  07/29/24: Bridge 10x 5  ASSESSMENT:  CLINICAL IMPRESSION: 07/29/24: Reviewed goals and educated importance of HEP  compliance.  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 Session focus with knee mobility and LE strengthening.  MMT with complete with significant weakness noted.  Added bridges for hip strengthening to HEP, pt able to demonstate with good form and mechanics in pain free range.  AROM 3-130 degrees.  Added TKE based exercises with verbal and tactile cueing for proper mechanics to address extension lag.  Pt tolerated well to session with no reports of increased pain, was limited by fatigue.  Assessed balance with inability to SLS, evaluating therapist messaged to add balance to POC.  Eval:  Patient is a 78 y.o. female who was seen today for physical therapy evaluation and treatment for s/p rt total knee replacement. Patient demonstrates decreased RLE strength, abnormal pain rating, decreased endurance, and impaired balance. Pt reports having symptoms of UTI and drop in O2 saturation into the 30s during home health PT session and has been using spouses old supplemental O2 tank for community ambulation. O2 levels stable throughout session today with supplemental O2, pt states she has appontment with primary care next Tuesday to address abnormal O2 levels. Patient also demonstrates difficulty with ambulation during today's session with need for therapist assist for supplemental O2 tank carrry and RW for balance, decreased stride length and velocity noted. Patient also demonstrates 121 degrees of knee flexion with no signs of pain and 3 degrees from neutral extension. Pt demonstrates tenderness to palpation to knee cap although mobility WFL, and posterior knee. Pt unable to stand without UE support for assist this date. Patient requires education on importance of HEP compliance, DVT, prognosis and role of PT. Patient would benefit from skilled physical therapy for decreased R knee pain, increased endurance  with ambulation, increased RLE strength, and balance for improved gait quality, return to higher level of function with ADLs, and progress towards therapy goals.  OBJECTIVE IMPAIRMENTS: Abnormal gait, decreased activity tolerance, decreased balance, decreased endurance, decreased knowledge of use of DME, decreased mobility, difficulty walking, decreased ROM, decreased strength, impaired sensation, and pain.   ACTIVITY LIMITATIONS: carrying, lifting, bending, standing, squatting, sleeping, stairs, transfers, and bed mobility  PARTICIPATION LIMITATIONS: meal prep, laundry, interpersonal relationship, shopping, community activity, and yard work  PERSONAL FACTORS: Age, Fitness, Past/current experiences, and Time since onset of injury/illness/exacerbation are also affecting patient's functional outcome.   REHAB POTENTIAL: Good  CLINICAL DECISION MAKING: Stable/uncomplicated  EVALUATION COMPLEXITY: Low   GOALS: Goals reviewed with patient? No  SHORT TERM GOALS: Target date: 08/07/24  Patient will demonstrate evidence of independence with individualized HEP and will report compliance for at least 3 days per week for optimized progression towards remaining therapy goals. Baseline:  Goal status: INITIAL  2.  Patient will report a decrease in pain level  during community ambulation by at least 2 points for improved quality of life. Baseline: 3/10 Goal status: INITIAL     LONG TERM GOALS: Target date: 08/28/24  Pt will demonstrate a an increase of at least 9 points on the LEFS for improved performance of community ambulation and ADL. Baseline: see objective Goal status: INITIAL  2.  Pt will improve 2 MWT by 50 feet in order to demonstrate improved functional ambulatory capacity in community setting.  Baseline: see objective Goal status: INITIAL  3.  Pt will demonstrate WFL ROM (flexion and extension) in right knee, for increased mobility and maximal efficiency of gait cycle during  ambulation. Baseline: see objective Goal status: INITIAL  4.  Pt will demonstrate at least 4-/5 MMT for right lower extremity for increased strength during ADL and community ambulation. Baseline: see objective Goal status: INITIAL  5.  Pt will improve 5TSTS by at least 2.3 seconds in order to improve strength during functional activities.. Baseline: see objective Goal status: INITIAL  6. Pt will demonstrate the ability to score at least a 22/30 on FGA to demonstrate decreased fall risk during ambulation and functional mobility. Baseline: see objective Goal status: Added 07/30/24  PLAN:  PT FREQUENCY: 1-2x/week  PT DURATION: 6 weeks  PLANNED INTERVENTIONS: 97110-Therapeutic exercises, 97530- Therapeutic activity, 97112- Neuromuscular re-education, 97535- Self Care, 02859- Manual therapy, [phone]- Gait training, Patient/Family education, Balance training, Stair training, Joint mobilization, DME instructions, Cryotherapy, and Moist heat  PLAN FOR NEXT SESSION: Improve knee extension range and progress RLE strengthening, progress gait training with LRAD.  Add balance goal to POC.   Augustin Mclean, LPTA/CLT; WILLAIM [phone]  4:36 PM, 07/29/24  Lang Ada, PT, DPT [facility] Office: [phone] 8:32 AM, 07/30/24

## 2024-07-29 NOTE — Telephone Encounter (Signed)
 Katherine Larson has found the walk documentation and has added it to OV.  Pt qualified for 4L continuous flow but did not get walked on POC.  Awaiting Dr. Theophilus response

## 2024-07-29 NOTE — Telephone Encounter (Signed)
 Dr. Theophilus LOV 9/2 pt qualified for 4L continuous flow.  I do not see flow rate or walk information for oxygen .  Your note states  Low oxygen  saturation episodes since knee surgery on August 11th, 2025 - Oxygen  saturation dropped to 33% during physical therapy session - Oxygen  levels return to baseline after resting - Requires supplemental oxygen  at night at 4 liters. You will need to use 4 liters of oxygen  during the day when you are active. We will also provide smaller portable oxygen  tanks to help you move around more easily.   Would you like me to place an order to [employer] to have them titrate her for POC?

## 2024-07-31 ENCOUNTER — Encounter (HOSPITAL_COMMUNITY): Payer: Self-pay

## 2024-07-31 ENCOUNTER — Ambulatory Visit (HOSPITAL_COMMUNITY)

## 2024-07-31 DIAGNOSIS — Z7409 Other reduced mobility: Secondary | ICD-10-CM | POA: Diagnosis not present

## 2024-07-31 DIAGNOSIS — M25561 Pain in right knee: Secondary | ICD-10-CM

## 2024-07-31 NOTE — Therapy (Signed)
 OUTPATIENT PHYSICAL THERAPY LOWER EXTREMITY TREATMENT   Patient Name: Katherine Larson MRN: [id number] DOB:02-25-1946, 78 y.o., female Today's Date: 07/31/2024  END OF SESSION:  PT End of Session - 07/31/24 0859     Visit Number 3    Number of Visits 12    Date for PT Re-Evaluation 08/28/24    Authorization Type [employer] MEDICARE    Authorization Time Period [facility] covered 10 visits from 07/17/24-08/21/2024    Authorization - Visit Number 2   late arrival   Authorization - Number of Visits 10    Progress Note Due on Visit 10    PT Start Time 0900    PT Stop Time 0930    PT Time Calculation (min) 30 min    Activity Tolerance Patient tolerated treatment well    Behavior During Therapy WFL for tasks assessed/performed          Past Medical History:  Diagnosis Date   AICD (automatic cardioverter/defibrillator) present    Anemia    anemia of chronic disease +/- IDA followed by Dr. Gatha. received iron  infusions in the past   Borderline glaucoma    CAD (coronary artery disease)    a. s/p CABG 1996. b. low risk nuc 2011. c. LHC 04/2016 due to drop in EF -> occluded native LAD,  widely patent sequential LIMA-D1-LAD.   Chronic kidney disease    mild stage of kidney disease   Chronic systolic CHF (congestive heart failure) (HCC)    Complication of anesthesia    DM2 (diabetes mellitus, type 2) (HCC)    not on any medicine for this at this time, 05/2016   Dyspnea    with exertion   GERD (gastroesophageal reflux disease)    Gout    History of blood transfusion    History of hiatal hernia    in 20s   History of pneumonia    March, 2016   HLD (hyperlipidemia)    HTN (hypertension)    Inappropriate sinus tachycardia (HCC)    LBBB (left bundle branch block)    a. 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 Dx 04/2016 - EF 25-30%,  diffuse HK, elevated LVEDP, mild MR, mod LAE.   Normocytic anemia 10/17/2021   NSVT (nonsustained ventricular tachycardia) (HCC)    PAT (paroxysmal atrial tachycardia) (HCC)    Pneumonia    PONV (postoperative nausea and vomiting)    after just about every surgery I've had   Rheumatoid arthritis (HCC)    RA- hands   Seasonal allergies    Past Surgical History:  Procedure Laterality Date   ABDOMINAL HYSTERECTOMY     APPENDECTOMY     BACK SURGERY     BIOPSY  04/27/2020   Procedure: BIOPSY;  Surgeon: Shaaron Lamar HERO, MD;  Location: [facility];  Service: Endoscopy;;  ascending colon biopsy   BIV UPGRADE N/A 05/14/2024   Procedure: BIV UPGRADE;  Surgeon: Waddell Danelle ORN, MD;  Location: [facility] INVASIVE CV LAB;  Service: Cardiovascular;  Laterality: N/A;   CARDIAC CATHETERIZATION N/A 05/01/2016   Procedure: Right/Left Heart Cath and Coronary/Graft Angiography;  Surgeon: Alm ORN Clay, MD;  Location: [facility] INVASIVE CV LAB;  Service: Cardiovascular;  Laterality: N/A;   CHOLECYSTECTOMY     COLONOSCOPY  11/2011   Dr. Magod:ext/int hemorrhoids, diverticulosis  sigmoid colon and distal desc colon, mid-desc colon hyperplastic polyps.    COLONOSCOPY WITH PROPOFOL  N/A 04/27/2020   Rourk: Diverticulosis, hemorrhoids, normal terminal ileum.  Random colon biopsies significant for chronic lymphocytic colitis. No repeat due to age.   CORONARY ARTERY BYPASS GRAFT  1996   2 vessels   ESOPHAGOGASTRODUODENOSCOPY  11/2011   Dr. Rosalie: small hiatal hernia, one non-bleeding superficial gastric ulcer, medium-sized diverticulum in area of papilla   ESOPHAGOGASTRODUODENOSCOPY N/A 12/29/2015   Dr. Shaaron: Chronic inactive gastritis, normal esophagus status post dilation, duodenal diverticula   ESOPHAGOGASTRODUODENOSCOPY (EGD) WITH PROPOFOL  N/A 07/24/2019   Dr. Shaaron: Normal esophagus status post empiric dilation, small hiatal hernia, large D2 diverticulum   ESOPHAGOGASTRODUODENOSCOPY (EGD) WITH PROPOFOL  N/A  06/03/2021   Surgeon: Shaaron Lamar HERO, MD; normal esophagus, normal stomach, normal duodenum.  Abnormal hypopharyngeal mucosa bilaterally just distal to the base of the tongue (right side greater than left), overlying mucosa appeared irregular and somewhat verrucous in appearance.  Recommended ENT evaluation.   EYE SURGERY Bilateral    cataract removal   FOOT SURGERY     removal bone spur   FRACTURE SURGERY Right    arm   GIVENS CAPSULE STUDY  11/2012   Dr. Rosalie: minimal gastritis, normal small bowel capsule   HERNIA REPAIR     hiatal hernia   ICD IMPLANT N/A 02/04/2021   Procedure: ICD IMPLANT;  Surgeon: Waddell Danelle ORN, MD;  Location: [facility] INVASIVE CV LAB;  Service: Cardiovascular;  Laterality: N/A;   MALONEY DILATION N/A 07/24/2019   Procedure: AGAPITO DILATION;  Surgeon: Shaaron Lamar HERO, MD;  Location: [facility];  Service: Endoscopy;  Laterality: N/A;   MALONEY DILATION N/A 06/03/2021   Procedure: AGAPITO DILATION;  Surgeon: Shaaron Lamar HERO, MD;  Location: [facility];  Service: Endoscopy;  Laterality: N/A;   MAXIMUM ACCESS (MAS)POSTERIOR LUMBAR INTERBODY FUSION (PLIF) 1 LEVEL N/A 08/14/2013   Procedure:  MAXIMUM ACCESS SURGERY(MAS) POSTERIOR LUMBAR INTERBODY FUSION LUMBAR THREE-FOUR ;  Surgeon: Alm GORMAN Molt, MD;  Location: [facility]O OR[facility];  Service: Neurosurgery;  Laterality: N/A;   MAXIMUM ACCESS SURGERY(MAS) POSTERIOR LUMBAR INTERBODY FUSION LUMBAR THREE-FOUR    PTCA     RIGHT/LEFT HEART CATH AND CORONARY ANGIOGRAPHY N/A 03/26/2020   Procedure: RIGHT/LEFT HEART CATH AND CORONARY ANGIOGRAPHY;  Surgeon: Anner Alm ORN, MD;  Location: [facility] INVASIVE CV LAB;  Service: Cardiovascular;  Laterality: N/A;   SPINAL CORD STIMULATOR BATTERY EXCHANGE N/A 01/19/2021   Procedure: Spinal cord stimulator battery change;  Surgeon: Darlis Deatrice GORMAN, MD;  Location: [facility] OR;  Service: Neurosurgery;  Laterality: N/A;   SPINAL CORD STIMULATOR INSERTION N/A 06/16/2016   Procedure: LUMBAR SPINAL CORD  STIMULATOR INSERTION;  Surgeon: Deward Fabian, MD;  Location: [facility];  Service: Neurosurgery;  Laterality: N/A;  LUMBAR SPINAL CORD STIMULATOR INSERTION   TEE WITHOUT CARDIOVERSION N/A 02/01/2023   Procedure: TRANSESOPHAGEAL ECHOCARDIOGRAM (TEE);  Surgeon: Debera Jayson MATSU, MD;  Location: [facility] ORS;  Service: Cardiovascular;  Laterality: N/A;   tens  05/2016   TONSILLECTOMY     TOTAL KNEE ARTHROPLASTY Right 06/30/2024   Procedure: ARTHROPLASTY, KNEE, TOTAL;  Surgeon: Yvone Rush, MD;  Location: [facility] ORS;  Service: Orthopedics;  Laterality: Right;  RIGHT TOTAL KNEE ARTHROPLASTY   Patient Active Problem List   Diagnosis Date Noted   Arthritis of right knee 06/30/2024   Primary osteoarthritis of right knee 06/27/2024   Bacteremia 02/01/2023   Pseudomonas sepsis (HCC) 01/30/2023   Acute respiratory failure with hypoxia (HCC) 01/28/2023  Multifocal pneumonia 01/28/2023   Melena 11/28/2022   Rectal bleeding 11/28/2022   Upper abdominal pain 11/28/2022   Early satiety 07/27/2022   Nausea without vomiting 07/27/2022   Hypotension 02/10/2022   Normocytic anemia 10/17/2021   Lymphocytic colitis 07/02/2020   Coronary artery disease, occlusive: CTO proxLAD 03/26/2020   Abnormal nuclear stress test 03/26/2020   Chronic diarrhea 03/02/2020   NICM- new drop in EF 25-30% 05/03/2016   Acute pulmonary edema (HCC)    LBBB (left bundle branch block) 04/29/2016   Chronic HFrEF (heart failure with reduced ejection fraction) (HCC) 04/29/2016   Abdominal pain, chronic, epigastric 03/07/2016   Septic shock (HCC)    COPD exacerbation (HCC) 01/21/2016   Hyperthyroidism 01/21/2016   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   Hypokalemia 01/19/2016   Hyponatremia 01/19/2016   Diet-controlled diabetes mellitus (HCC) 01/19/2016   CAP (community acquired pneumonia) 01/18/2016   Mucosal abnormality of stomach    Dysphagia    Constipation 12/08/2015   Gastroesophageal reflux disease 12/08/2015   Abnormal CT scan,  esophagus 12/08/2015   Esophageal dysphagia 12/08/2015   Abdominal pain, epigastric 12/08/2015   Loss of weight 12/08/2015   Lower abdominal pain 12/08/2015   Back pain 08/26/2015   Headache 08/26/2015   Essential hypertension 08/26/2015   DM type 2 (diabetes mellitus, type 2) (HCC) 08/26/2015   Sinus tachycardia 06/07/2015   Diarrhea 06/07/2015   Preop cardiovascular exam 07/16/2013   Atrial flutter (HCC) 07/16/2013   Anemia 01/08/2013   Leukocytosis 12/17/2012   DYSPNEA 02/02/2010   DIABETES MELLITUS 07/28/2009   Hyperlipidemia 07/28/2009   CHOLECYSTECTOMY, HX OF 07/28/2009   Hx of CABG '96- cath 05/01/16 07/28/2009   HERNIORRHAPHY, HX OF 07/28/2009    PCP: Teresa Channel, MD   REFERRING PROVIDER: Yvone Rush, MD  REFERRING DIAG: s/p rt total knee replacement  THERAPY DIAG:  Right knee pain, unspecified chronicity  Impaired functional mobility, balance, gait, and endurance  Rationale for Evaluation and Treatment: Rehabilitation  ONSET DATE: 06/30/24 RTKA  SUBJECTIVE:   SUBJECTIVE STATEMENT: Pt states she did not get much sleep last night due to increased back pain. Pt states the knee has not been bothering her. Pt states she did not complete HEP yesterday due to back pain, supposed to see back doctor today at 1:00.  Eval:  Pt states she had the surgery the 11th. Pt states she had home health PT about 6 times and had some issues with the oxygen . Previously she jut wore oxygen  at night. Pt has many ongoing health issues and has been struggling with symptoms of UTI and diarrhea the past few days.   PERTINENT HISTORY: -Been using husbands supplemental O2 since home therapy, had just been using it at night before the surgery PAIN:  Are you having pain? Yes: NPRS scale: 3/10 Pain location: right knee Pain description: ache Aggravating factors: being up on it Relieving factors: rest elevated  PRECAUTIONS: Fall  RED FLAGS: UTI symptoms during urination   WEIGHT  BEARING RESTRICTIONS: WBAT  FALLS:  Has patient fallen in last 6 months? No  LIVING ENVIRONMENT: Lives with: lives with their spouse Lives in: House/apartment Stairs: No Has following equipment at home: Environmental consultant - 2 wheeled  OCCUPATION: retired, Biomedical engineer  PLOF: Requires assistive device for independence, Needs assistance with ADLs, and Needs assistance with gait  PATIENT GOALS: walk faster, decrease the knee pain, improve balance, increase LE strength  NEXT MD VISIT: Next Tuesday for Oxygen    OBJECTIVE:  Note: Objective measures were completed at Evaluation  unless otherwise noted.  DIAGNOSTIC FINDINGS: none  PATIENT SURVEYS:  LEFS : 35/80  COGNITION: Overall cognitive status: Within functional limits for tasks assessed     SENSATION: Light touch: Impaired numbness around incision  EDEMA:  RLE knee and ankle  PALPATION: Tenderness to palpation at posterior knee and incision area.  LOWER EXTREMITY ROM:  Active ROM Right eval Left eval Right 07/29/24  Hip flexion     Hip extension     Hip abduction     Hip adduction     Hip internal rotation     Hip external rotation     Knee flexion 121  130  Knee extension 3 from neutral  3  Ankle dorsiflexion     Ankle plantarflexion     Ankle inversion     Ankle eversion      (Blank rows = not tested)  LOWER EXTREMITY MMT:  MMT Right 07/29/24 Left 07/29/24  Hip flexion 3+ 3/5  Hip extension Cannot tolerate prone, complete sidelying next session. Cannot tolerate prone, complete sidelying next session.  Hip abduction 3/5 3/5  Hip adduction    Hip internal rotation    Hip external rotation    Knee flexion    Knee extension    Ankle dorsiflexion    Ankle plantarflexion    Ankle inversion    Ankle eversion     (Blank rows = not tested)    FUNCTIONAL TESTS:  5 times sit to stand: 16.48 seconds, 94% O2, with [place] 2 minute walk test: 185 feet, 99% O2 with [place]  GAIT: Distance walked: 200 feet Assistive device  utilized: Environmental consultant - 2 wheeled Level of assistance: Modified independence Comments: Pt requires therapist assist to carry supplemental O2 supply, decreased gait speed noted, decreased visual acuity noted, and slight antalgic pattern noted with decreased stance time on RLE although minimal.                                                                                                                                 TREATMENT DATE:  07/31/2024  Therapeutic Exercise: -Stationary bike, 3 minutes, pt able to power up bike after a minute and a half -Hamstring curls, 2 sets of 10 reps, plate 1, blocker on setting 3 to decreased strain on knee extension -Leg press, 2 sets 10 reps, plate 2, pt cued for increased eccentric control Neuromuscular Re-education: -Single leg stance, 2 trials bilaterally, L able to hold for 3 seconds, R able to hold less than 1 second -TKE on 4 inch step, 2 sets of 7 reps RLE only, pt cued for LLE sequencing Therapeutic Activity: -Step up and overs, 6 inch step, 1 set of 7 reps, pt cued for decreased speed for decreased dizziness   07/29/24: Vitals: O2 sat 99-100% HR 88 room air Reviewed goals Educated importance of HEP compliance  MMT see above  Supine: with wedge and 2 pillows under bed  Quad sets 10x  SAQ on half bolster  Bridge 10x5 AROM 3-130 degrees Standing:  SLS unable   Heel raises 10x  Toe raises 10x Vitals:  97%, HR 85 bpm  07/17/2024  Vitals: O2 sat: 94% O2 after 5TSTS Evaluation: -ROM measured, Strength assessed, HEP prescribed, pt educated on prognosis, findings, and importance of HEP compliance if given.     PATIENT EDUCATION:  Education details: Pt was educated on findings of PT evaluation, prognosis, frequency of therapy visits and rationale, attendance policy, and HEP if given.   Person educated: Patient and Spouse Education method: Explanation, Verbal cues, and Handouts Education comprehension: verbalized understanding, verbal cues  required, and needs further education  HOME EXERCISE PROGRAM: Access Code: [email or web address] URL: [email or web address] Date: 07/17/2024 Prepared by: Lang Ada  Exercises - Seated Knee Extension AROM  - 1 x daily - 7 x weekly - 3 sets - 10 reps - Sit to Stand with Armchair  - 1 x daily - 7 x weekly - 3 sets - 10 reps - Heel Raises with Counter Support  - 1 x daily - 7 x weekly - 3 sets - 10 reps  07/29/24: Bridge 10x 5  ASSESSMENT:  CLINICAL IMPRESSION: Patient demonstrates increased low back pain and continues to demonstrate decreased RLE strength, decreased gait quality and balance. Patient also demonstrates increased forward head posture and flexed truck especially present during today's session due to back pain. Patient able to progress dynamic balance and core activation exercises today with step up variations and LE strengthening machines, good performance with verbal cueing. Patient would continue to benefit from skilled physical therapy for increased endurance with ambulation, increased RLE strength, and improved balance for improved quality of life, improved independence with gait training and continued progress towards therapy goals.   Eval:  Patient is a 78 y.o. female who was seen today for physical therapy evaluation and treatment for s/p rt total knee replacement. Patient demonstrates decreased RLE strength, abnormal pain rating, decreased endurance, and impaired balance. Pt reports having symptoms of UTI and drop in O2 saturation into the 30s during home health PT session and has been using spouses old supplemental O2 tank for community ambulation. O2 levels stable throughout session today with supplemental O2, pt states she has appontment with primary care next Tuesday to address abnormal O2 levels. Patient also demonstrates difficulty with ambulation during today's session with need for therapist assist for supplemental O2 tank carrry and RW for balance, decreased stride  length and velocity noted. Patient also demonstrates 121 degrees of knee flexion with no signs of pain and 3 degrees from neutral extension. Pt demonstrates tenderness to palpation to knee cap although mobility WFL, and posterior knee. Pt unable to stand without UE support for assist this date. Patient requires education on importance of HEP compliance, DVT, prognosis and role of PT. Patient would benefit from skilled physical therapy for decreased R knee pain, increased endurance with ambulation, increased RLE strength, and balance for improved gait quality, return to higher level of function with ADLs, and progress towards therapy goals.  OBJECTIVE IMPAIRMENTS: Abnormal gait, decreased activity tolerance, decreased balance, decreased endurance, decreased knowledge of use of DME, decreased mobility, difficulty walking, decreased ROM, decreased strength, impaired sensation, and pain.   ACTIVITY LIMITATIONS: carrying, lifting, bending, standing, squatting, sleeping, stairs, transfers, and bed mobility  PARTICIPATION LIMITATIONS: meal prep, laundry, interpersonal relationship, shopping, community activity, and yard work  PERSONAL FACTORS: Age, Fitness, Past/current experiences, and Time since onset of injury/illness/exacerbation are also affecting patient's functional outcome.  REHAB POTENTIAL: Good  CLINICAL DECISION MAKING: Stable/uncomplicated  EVALUATION COMPLEXITY: Low   GOALS: Goals reviewed with patient? No  SHORT TERM GOALS: Target date: 08/07/24  Patient will demonstrate evidence of independence with individualized HEP and will report compliance for at least 3 days per week for optimized progression towards remaining therapy goals. Baseline:  Goal status: INITIAL  2.  Patient will report a decrease in pain level during community ambulation by at least 2 points for improved quality of life. Baseline: 3/10 Goal status: INITIAL     LONG TERM GOALS: Target date: 08/28/24  Pt  will demonstrate a an increase of at least 9 points on the LEFS for improved performance of community ambulation and ADL. Baseline: see objective Goal status: INITIAL  2.  Pt will improve 2 MWT by 50 feet in order to demonstrate improved functional ambulatory capacity in community setting.  Baseline: see objective Goal status: INITIAL  3.  Pt will demonstrate WFL ROM (flexion and extension) in right knee, for increased mobility and maximal efficiency of gait cycle during ambulation. Baseline: see objective Goal status: INITIAL  4.  Pt will demonstrate at least 4-/5 MMT for right lower extremity for increased strength during ADL and community ambulation. Baseline: see objective Goal status: INITIAL  5.  Pt will improve 5TSTS by at least 2.3 seconds in order to improve strength during functional activities.. Baseline: see objective Goal status: INITIAL  6. Pt will demonstrate the ability to score at least a 22/30 on FGA to demonstrate decreased fall risk during ambulation and functional mobility. Baseline: see objective Goal status: Added 07/30/24  PLAN:  PT FREQUENCY: 1-2x/week  PT DURATION: 6 weeks  PLANNED INTERVENTIONS: 97110-Therapeutic exercises, 97530- Therapeutic activity, 97112- Neuromuscular re-education, 97535- Self Care, [id number]- Manual therapy, [phone]- Gait training, Patient/Family education, Balance training, Stair training, Joint mobilization, DME instructions, Cryotherapy, and Moist heat  PLAN FOR NEXT SESSION: Improve knee extension range and progress RLE strengthening, progress gait training with LRAD.     Lang Ada, PT, DPT [facility] Office: [phone] 9:33 AM, 07/31/24

## 2024-08-01 ENCOUNTER — Ambulatory Visit

## 2024-08-01 NOTE — Telephone Encounter (Signed)
 Order placed.NFN

## 2024-08-01 NOTE — Telephone Encounter (Signed)
 Place order for supplemental oxygen  4 L continuous. Request smaller tanks to help her with mobility.

## 2024-08-04 ENCOUNTER — Encounter

## 2024-08-05 ENCOUNTER — Encounter (HOSPITAL_COMMUNITY)

## 2024-08-06 NOTE — Progress Notes (Signed)
 No ICM remote transmission received for 08/04/2024 and next ICM transmission scheduled for 08/18/2024.

## 2024-08-07 ENCOUNTER — Encounter (HOSPITAL_COMMUNITY)

## 2024-08-11 ENCOUNTER — Inpatient Hospital Stay

## 2024-08-11 ENCOUNTER — Inpatient Hospital Stay: Attending: Physician Assistant

## 2024-08-11 ENCOUNTER — Encounter (HOSPITAL_COMMUNITY): Payer: Self-pay | Admitting: Oncology

## 2024-08-11 VITALS — BP 131/61 | HR 84 | Temp 98.0°F | Resp 18

## 2024-08-11 DIAGNOSIS — N1831 Chronic kidney disease, stage 3a: Secondary | ICD-10-CM | POA: Diagnosis not present

## 2024-08-11 DIAGNOSIS — D631 Anemia in chronic kidney disease: Secondary | ICD-10-CM | POA: Insufficient documentation

## 2024-08-11 DIAGNOSIS — D649 Anemia, unspecified: Secondary | ICD-10-CM

## 2024-08-11 DIAGNOSIS — I129 Hypertensive chronic kidney disease with stage 1 through stage 4 chronic kidney disease, or unspecified chronic kidney disease: Secondary | ICD-10-CM | POA: Insufficient documentation

## 2024-08-11 LAB — CBC
HCT: 33 % — ABNORMAL LOW (ref 36.0–46.0)
Hemoglobin: 10.3 g/dL — ABNORMAL LOW (ref 12.0–15.0)
MCH: 34.2 pg — ABNORMAL HIGH (ref 26.0–34.0)
MCHC: 31.2 g/dL (ref 30.0–36.0)
MCV: 109.6 fL — ABNORMAL HIGH (ref 80.0–100.0)
Platelets: 176 K/uL (ref 150–400)
RBC: 3.01 MIL/uL — ABNORMAL LOW (ref 3.87–5.11)
RDW: 16.1 % — ABNORMAL HIGH (ref 11.5–15.5)
WBC: 6.5 K/uL (ref 4.0–10.5)
nRBC: 0 % (ref 0.0–0.2)

## 2024-08-11 MED ORDER — EPOETIN ALFA-EPBX 10000 UNIT/ML IJ SOLN
10000.0000 [IU] | Freq: Once | INTRAMUSCULAR | Status: AC
  Administered 2024-08-11: 10000 [IU] via SUBCUTANEOUS
  Filled 2024-08-11: qty 1

## 2024-08-11 NOTE — Progress Notes (Signed)
 Patient tolerated injection with no complaints voiced.  Site clean and dry with no bruising or swelling noted at site.  See MAR for details.  Band aid applied.  Patient stable during and after injection.  Vss with discharge and left in satisfactory condition with no s/s of distress noted.

## 2024-08-11 NOTE — Patient Instructions (Signed)

## 2024-08-12 ENCOUNTER — Ambulatory Visit (HOSPITAL_COMMUNITY)

## 2024-08-12 ENCOUNTER — Encounter (HOSPITAL_COMMUNITY): Payer: Self-pay

## 2024-08-12 DIAGNOSIS — M25561 Pain in right knee: Secondary | ICD-10-CM | POA: Diagnosis not present

## 2024-08-12 DIAGNOSIS — Z7409 Other reduced mobility: Secondary | ICD-10-CM | POA: Diagnosis not present

## 2024-08-12 NOTE — Therapy (Signed)
 OUTPATIENT PHYSICAL THERAPY LOWER EXTREMITY TREATMENT   Patient Name: Katherine Larson MRN: [id number] DOB:1946-02-16, 78 y.o., female Today's Date: 08/12/2024  END OF SESSION:  PT End of Session - 08/12/24 1028     Visit Number 4    Number of Visits 12    Date for Recertification  08/28/24    Authorization Type [employer] MEDICARE    Authorization Time Period [facility] covered 10 visits from 07/17/24-08/21/2024    Authorization - Visit Number 3    Authorization - Number of Visits 10    Progress Note Due on Visit 10    PT Start Time 1030    PT Stop Time 1115    PT Time Calculation (min) 45 min    Activity Tolerance Patient tolerated treatment well    Behavior During Therapy WFL for tasks assessed/performed           Past Medical History:  Diagnosis Date   AICD (automatic cardioverter/defibrillator) present    Anemia    anemia of chronic disease +/- IDA followed by Dr. Gatha. received iron  infusions in the past   Borderline glaucoma    CAD (coronary artery disease)    a. s/p CABG 1996. b. low risk nuc 2011. c. LHC 04/2016 due to drop in EF -> occluded native LAD,  widely patent sequential LIMA-D1-LAD.   Chronic kidney disease    mild stage of kidney disease   Chronic systolic CHF (congestive heart failure) (HCC)    Complication of anesthesia    DM2 (diabetes mellitus, type 2) (HCC)    not on any medicine for this at this time, 05/2016   Dyspnea    with exertion   GERD (gastroesophageal reflux disease)    Gout    History of blood transfusion    History of hiatal hernia    in 20s   History of pneumonia    March, 2016   HLD (hyperlipidemia)    HTN (hypertension)    Inappropriate sinus tachycardia    LBBB (left bundle branch block)    a. 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 Dx 04/2016 - EF 25-30%, diffuse HK, elevated  LVEDP, mild MR, mod LAE.   Normocytic anemia 10/17/2021   NSVT (nonsustained ventricular tachycardia) (HCC)    PAT (paroxysmal atrial tachycardia)    Pneumonia    PONV (postoperative nausea and vomiting)    after just about every surgery I've had   Rheumatoid arthritis (HCC)    RA- hands   Seasonal allergies    Past Surgical History:  Procedure Laterality Date   ABDOMINAL HYSTERECTOMY     APPENDECTOMY     BACK SURGERY     BIOPSY  04/27/2020   Procedure: BIOPSY;  Surgeon: Shaaron Lamar HERO, MD;  Location: [facility];  Service: Endoscopy;;  ascending colon biopsy   BIV UPGRADE N/A 05/14/2024   Procedure: BIV UPGRADE;  Surgeon: Waddell Danelle ORN, MD;  Location: [facility] INVASIVE CV LAB;  Service: Cardiovascular;  Laterality: N/A;   CARDIAC CATHETERIZATION N/A 05/01/2016   Procedure: Right/Left Heart Cath and Coronary/Graft Angiography;  Surgeon: Alm ORN Clay, MD;  Location: [facility] INVASIVE CV LAB;  Service: Cardiovascular;  Laterality: N/A;   CHOLECYSTECTOMY     COLONOSCOPY  11/2011   Dr. Magod:ext/int hemorrhoids, diverticulosis sigmoid colon and distal  desc colon, mid-desc colon hyperplastic polyps.    COLONOSCOPY WITH PROPOFOL  N/A 04/27/2020   Rourk: Diverticulosis, hemorrhoids, normal terminal ileum.  Random colon biopsies significant for chronic lymphocytic colitis. No repeat due to age.   CORONARY ARTERY BYPASS GRAFT  1996   2 vessels   ESOPHAGOGASTRODUODENOSCOPY  11/2011   Dr. Rosalie: small hiatal hernia, one non-bleeding superficial gastric ulcer, medium-sized diverticulum in area of papilla   ESOPHAGOGASTRODUODENOSCOPY N/A 12/29/2015   Dr. Shaaron: Chronic inactive gastritis, normal esophagus status post dilation, duodenal diverticula   ESOPHAGOGASTRODUODENOSCOPY (EGD) WITH PROPOFOL  N/A 07/24/2019   Dr. Shaaron: Normal esophagus status post empiric dilation, small hiatal hernia, large D2 diverticulum   ESOPHAGOGASTRODUODENOSCOPY (EGD) WITH PROPOFOL  N/A 06/03/2021   Surgeon: Shaaron Lamar HERO, MD; normal esophagus, normal stomach, normal duodenum.  Abnormal hypopharyngeal mucosa bilaterally just distal to the base of the tongue (right side greater than left), overlying mucosa appeared irregular and somewhat verrucous in appearance.  Recommended ENT evaluation.   EYE SURGERY Bilateral    cataract removal   FOOT SURGERY     removal bone spur   FRACTURE SURGERY Right    arm   GIVENS CAPSULE STUDY  11/2012   Dr. Rosalie: minimal gastritis, normal small bowel capsule   HERNIA REPAIR     hiatal hernia   ICD IMPLANT N/A 02/04/2021   Procedure: ICD IMPLANT;  Surgeon: Waddell Danelle ORN, MD;  Location: [facility] INVASIVE CV LAB;  Service: Cardiovascular;  Laterality: N/A;   MALONEY DILATION N/A 07/24/2019   Procedure: AGAPITO DILATION;  Surgeon: Shaaron Lamar HERO, MD;  Location: [facility];  Service: Endoscopy;  Laterality: N/A;   MALONEY DILATION N/A 06/03/2021   Procedure: AGAPITO DILATION;  Surgeon: Shaaron Lamar HERO, MD;  Location: [facility];  Service: Endoscopy;  Laterality: N/A;   MAXIMUM ACCESS (MAS)POSTERIOR LUMBAR INTERBODY FUSION (PLIF) 1 LEVEL N/A 08/14/2013   Procedure:  MAXIMUM ACCESS SURGERY(MAS) POSTERIOR LUMBAR INTERBODY FUSION LUMBAR THREE-FOUR ;  Surgeon: Alm GORMAN Molt, MD;  Location: [facility] NEURO OR[facility];  Service: Neurosurgery;  Laterality: N/A;   MAXIMUM ACCESS SURGERY(MAS) POSTERIOR LUMBAR INTERBODY FUSION LUMBAR THREE-FOUR    PTCA     RIGHT/LEFT HEART CATH AND CORONARY ANGIOGRAPHY N/A 03/26/2020   Procedure: RIGHT/LEFT HEART CATH AND CORONARY ANGIOGRAPHY;  Surgeon: Anner Alm ORN, MD;  Location: [facility] INVASIVE CV LAB;  Service: Cardiovascular;  Laterality: N/A;   SPINAL CORD STIMULATOR BATTERY EXCHANGE N/A 01/19/2021   Procedure: Spinal cord stimulator battery change;  Surgeon: Darlis Deatrice GORMAN, MD;  Location: [facility] OR;  Service: Neurosurgery;  Laterality: N/A;   SPINAL CORD STIMULATOR INSERTION N/A 06/16/2016   Procedure: LUMBAR SPINAL CORD STIMULATOR INSERTION;  Surgeon: Deward Fabian, MD;  Location: [facility];  Service: Neurosurgery;  Laterality: N/A;  LUMBAR SPINAL CORD STIMULATOR INSERTION   TEE WITHOUT CARDIOVERSION N/A 02/01/2023   Procedure: TRANSESOPHAGEAL ECHOCARDIOGRAM (TEE);  Surgeon: Debera Jayson MATSU, MD;  Location: [facility] ORS;  Service: Cardiovascular;  Laterality: N/A;   tens  05/2016   TONSILLECTOMY     TOTAL KNEE ARTHROPLASTY Right 06/30/2024   Procedure: ARTHROPLASTY, KNEE, TOTAL;  Surgeon: Yvone Rush, MD;  Location: [facility] ORS;  Service: Orthopedics;  Laterality: Right;  RIGHT TOTAL KNEE ARTHROPLASTY   Patient Active Problem List   Diagnosis Date Noted   Arthritis of right knee 06/30/2024   Primary osteoarthritis of right knee 06/27/2024   Bacteremia 02/01/2023   Pseudomonas sepsis (HCC) 01/30/2023   Acute respiratory failure with hypoxia (HCC) 01/28/2023   Multifocal pneumonia  01/28/2023   Melena 11/28/2022   Rectal bleeding 11/28/2022   Upper abdominal pain 11/28/2022   Early satiety 07/27/2022   Nausea without vomiting 07/27/2022   Hypotension 02/10/2022   Normocytic anemia 10/17/2021   Lymphocytic colitis 07/02/2020   Coronary artery disease, occlusive: CTO proxLAD 03/26/2020   Abnormal nuclear stress test 03/26/2020   Chronic diarrhea 03/02/2020   NICM- new drop in EF 25-30% 05/03/2016   Acute pulmonary edema (HCC)    LBBB (left bundle branch block) 04/29/2016   Chronic HFrEF (heart failure with reduced ejection fraction) (HCC) 04/29/2016   Abdominal pain, chronic, epigastric 03/07/2016   Septic shock (HCC)    COPD exacerbation (HCC) 01/21/2016   Hyperthyroidism 01/21/2016   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   Hypokalemia 01/19/2016   Hyponatremia 01/19/2016   Diet-controlled diabetes mellitus (HCC) 01/19/2016   CAP (community acquired pneumonia) 01/18/2016   Mucosal abnormality of stomach    Dysphagia    Constipation 12/08/2015   Gastroesophageal reflux disease 12/08/2015   Abnormal CT scan, esophagus 12/08/2015   Esophageal  dysphagia 12/08/2015   Abdominal pain, epigastric 12/08/2015   Loss of weight 12/08/2015   Lower abdominal pain 12/08/2015   Back pain 08/26/2015   Headache 08/26/2015   Essential hypertension 08/26/2015   DM type 2 (diabetes mellitus, type 2) (HCC) 08/26/2015   Sinus tachycardia 06/07/2015   Diarrhea 06/07/2015   Preop cardiovascular exam 07/16/2013   Atrial flutter (HCC) 07/16/2013   Anemia 01/08/2013   Leukocytosis 12/17/2012   DYSPNEA 02/02/2010   DIABETES MELLITUS 07/28/2009   Hyperlipidemia 07/28/2009   CHOLECYSTECTOMY, HX OF 07/28/2009   Hx of CABG '96- cath 05/01/16 07/28/2009   HERNIORRHAPHY, HX OF 07/28/2009    PCP: Teresa Channel, MD   REFERRING PROVIDER: Yvone Rush, MD  REFERRING DIAG: s/p rt total knee replacement  THERAPY DIAG:  Right knee pain, unspecified chronicity  Impaired functional mobility, balance, gait, and endurance  Rationale for Evaluation and Treatment: Rehabilitation  ONSET DATE: 06/30/24 RTKA  SUBJECTIVE:   SUBJECTIVE STATEMENT: Pt states husband has been in the hospital for 2 weeks and coded so they transferred him to [place] [facility], has been on a ventilator, pt states she has not been doing much HEP. Pt states right hip has been bothering her and feels that bones are jutting out. Right knee pain rated 3/10 upon presentation.   Eval:  Pt states she had the surgery the 11th. Pt states she had home health PT about 6 times and had some issues with the oxygen . Previously she jut wore oxygen  at night. Pt has many ongoing health issues and has been struggling with symptoms of UTI and diarrhea the past few days.   PERTINENT HISTORY: -Been using husbands supplemental O2 since home therapy, had just been using it at night before the surgery PAIN:  Are you having pain? Yes: NPRS scale: 3/10 Pain location: right knee Pain description: ache Aggravating factors: being up on it Relieving factors: rest elevated  PRECAUTIONS: Fall  RED FLAGS: UTI  symptoms during urination   WEIGHT BEARING RESTRICTIONS: WBAT  FALLS:  Has patient fallen in last 6 months? No  LIVING ENVIRONMENT: Lives with: lives with their spouse Lives in: House/apartment Stairs: No Has following equipment at home: Environmental consultant - 2 wheeled  OCCUPATION: retired, Biomedical engineer  PLOF: Requires assistive device for independence, Needs assistance with ADLs, and Needs assistance with gait  PATIENT GOALS: walk faster, decrease the knee pain, improve balance, increase LE strength  NEXT MD VISIT: Next Tuesday for Oxygen   OBJECTIVE:  Note: Objective measures were completed at Evaluation unless otherwise noted.  DIAGNOSTIC FINDINGS: none  PATIENT SURVEYS:  LEFS : 35/80  COGNITION: Overall cognitive status: Within functional limits for tasks assessed     SENSATION: Light touch: Impaired numbness around incision  EDEMA:  RLE knee and ankle  PALPATION: Tenderness to palpation at posterior knee and incision area.  LOWER EXTREMITY ROM:  Active ROM Right eval Left eval Right 07/29/24  Hip flexion     Hip extension     Hip abduction     Hip adduction     Hip internal rotation     Hip external rotation     Knee flexion 121  130  Knee extension 3 from neutral  3  Ankle dorsiflexion     Ankle plantarflexion     Ankle inversion     Ankle eversion      (Blank rows = not tested)  LOWER EXTREMITY MMT:  MMT Right 07/29/24 Left 07/29/24  Hip flexion 3+ 3/5  Hip extension Cannot tolerate prone, complete sidelying next session. Cannot tolerate prone, complete sidelying next session.  Hip abduction 3/5 3/5  Hip adduction    Hip internal rotation    Hip external rotation    Knee flexion    Knee extension    Ankle dorsiflexion    Ankle plantarflexion    Ankle inversion    Ankle eversion     (Blank rows = not tested)    FUNCTIONAL TESTS:  5 times sit to stand: 16.48 seconds, 94% O2, with [place] 2 minute walk test: 185 feet, 99% O2 with  [place]  GAIT: Distance walked: 200 feet Assistive device utilized: Environmental consultant - 2 wheeled Level of assistance: Modified independence Comments: Pt requires therapist assist to carry supplemental O2 supply, decreased gait speed noted, decreased visual acuity noted, and slight antalgic pattern noted with decreased stance time on RLE although minimal.                                                                                                                                 TREATMENT DATE:  08/12/2024  Therapeutic Exercise: -Stationary bike, 4 minutes, pt able to power up bike immediately, pt cued for increased pace, level 4 resistance -Hamstring curls, 3 sets of 10 reps, plate 3>4, pt cued for increased eccentric control and decreased knee valgus -Leg press, 2 sets 7 reps, plate 3, pt cued for increased eccentric control -Lateral stepping with RTB at knees, pt cued for increased step length and increased eccentric control, 3 laps 20 steps per lap -Heel/toe raises, 1 set of 20 reps, pt cued for increased ROM Neuromuscular Re-education: -Aeromat walks, lateral/tandem stepping, 2 laps each variation in parallel bars, pt cued for proper form and LE placement -Fwd lunges on bosu ball, pt cued for one UE support, 1 set of 7 reps bilaterally Therapeutic Activity: -Step up and overs, 8 inch step, 1 set of 5 reps,  pt cued for increased step length -Lateral step up and overs, 8 inch step, 1 set of 5 reps, pt cued for increased foot clearance  07/31/2024  Therapeutic Exercise: -Stationary bike, 3 minutes, pt able to power up bike after a minute and a half -Hamstring curls, 2 sets of 10 reps, plate 1, blocker on setting 3 to decreased strain on knee extension -Leg press, 2 sets 10 reps, plate 2, pt cued for increased eccentric control Neuromuscular Re-education: -Single leg stance, 2 trials bilaterally, L able to hold for 3 seconds, R able to hold less than 1 second -TKE on 4 inch step, 2 sets of 7 reps  RLE only, pt cued for LLE sequencing Therapeutic Activity: -Step up and overs, 6 inch step, 1 set of 7 reps, pt cued for decreased speed for decreased dizziness   07/29/24: Vitals: O2 sat 99-100% HR 88 room air Reviewed goals Educated importance of HEP compliance  MMT see above  Supine: with wedge and 2 pillows under bed  Quad sets 10x  SAQ on half bolster  Bridge 10x5 AROM 3-130 degrees Standing:  SLS unable   Heel raises 10x  Toe raises 10x Vitals:  97%, HR 85 bpm   PATIENT EDUCATION:  Education details: Pt was educated on findings of PT evaluation, prognosis, frequency of therapy visits and rationale, attendance policy, and HEP if given.   Person educated: Patient and Spouse Education method: Explanation, Verbal cues, and Handouts Education comprehension: verbalized understanding, verbal cues required, and needs further education  HOME EXERCISE PROGRAM: Access Code: [email or web address] URL: [email or web address] Date: 07/17/2024 Prepared by: Lang Ada  Exercises - Seated Knee Extension AROM  - 1 x daily - 7 x weekly - 3 sets - 10 reps - Sit to Stand with Armchair  - 1 x daily - 7 x weekly - 3 sets - 10 reps - Heel Raises with Counter Support  - 1 x daily - 7 x weekly - 3 sets - 10 reps  07/29/24: Bridge 10x 5  ASSESSMENT:  CLINICAL IMPRESSION: Patient demonstrates increased R knee pain and continues to demonstrate decreased RLE strength/ROM, decreased gait quality and balance. Patient also demonstrates good right knee flexion but decreased extension ROM in R knee. Patient able to progress dynamic balance and core activation exercises today with step up variations and resisted walking, good performance with verbal cueing. Pt educated on importance of HEP and emphasis placed on long arc quad exercise and other extension biased exercises. Patient would continue to benefit from skilled physical therapy for increased endurance with ambulation, increased RLE  strength/ROM, and improved balance for improved quality of life, improved independence with gait training and continued progress towards therapy goals.   Eval:  Patient is a 78 y.o. female who was seen today for physical therapy evaluation and treatment for s/p rt total knee replacement. Patient demonstrates decreased RLE strength, abnormal pain rating, decreased endurance, and impaired balance. Pt reports having symptoms of UTI and drop in O2 saturation into the 30s during home health PT session and has been using spouses old supplemental O2 tank for community ambulation. O2 levels stable throughout session today with supplemental O2, pt states she has appontment with primary care next Tuesday to address abnormal O2 levels. Patient also demonstrates difficulty with ambulation during today's session with need for therapist assist for supplemental O2 tank carrry and RW for balance, decreased stride length and velocity noted. Patient also demonstrates 121 degrees of knee flexion with no signs of pain and 3 degrees from  neutral extension. Pt demonstrates tenderness to palpation to knee cap although mobility WFL, and posterior knee. Pt unable to stand without UE support for assist this date. Patient requires education on importance of HEP compliance, DVT, prognosis and role of PT. Patient would benefit from skilled physical therapy for decreased R knee pain, increased endurance with ambulation, increased RLE strength, and balance for improved gait quality, return to higher level of function with ADLs, and progress towards therapy goals.  OBJECTIVE IMPAIRMENTS: Abnormal gait, decreased activity tolerance, decreased balance, decreased endurance, decreased knowledge of use of DME, decreased mobility, difficulty walking, decreased ROM, decreased strength, impaired sensation, and pain.   ACTIVITY LIMITATIONS: carrying, lifting, bending, standing, squatting, sleeping, stairs, transfers, and bed  mobility  PARTICIPATION LIMITATIONS: meal prep, laundry, interpersonal relationship, shopping, community activity, and yard work  PERSONAL FACTORS: Age, Fitness, Past/current experiences, and Time since onset of injury/illness/exacerbation are also affecting patient's functional outcome.   REHAB POTENTIAL: Good  CLINICAL DECISION MAKING: Stable/uncomplicated  EVALUATION COMPLEXITY: Low   GOALS: Goals reviewed with patient? No  SHORT TERM GOALS: Target date: 08/07/24  Patient will demonstrate evidence of independence with individualized HEP and will report compliance for at least 3 days per week for optimized progression towards remaining therapy goals. Baseline:  Goal status: INITIAL  2.  Patient will report a decrease in pain level during community ambulation by at least 2 points for improved quality of life. Baseline: 3/10 Goal status: INITIAL     LONG TERM GOALS: Target date: 08/28/24  Pt will demonstrate a an increase of at least 9 points on the LEFS for improved performance of community ambulation and ADL. Baseline: see objective Goal status: INITIAL  2.  Pt will improve 2 MWT by 50 feet in order to demonstrate improved functional ambulatory capacity in community setting.  Baseline: see objective Goal status: INITIAL  3.  Pt will demonstrate WFL ROM (flexion and extension) in right knee, for increased mobility and maximal efficiency of gait cycle during ambulation. Baseline: see objective Goal status: INITIAL  4.  Pt will demonstrate at least 4-/5 MMT for right lower extremity for increased strength during ADL and community ambulation. Baseline: see objective Goal status: INITIAL  5.  Pt will improve 5TSTS by at least 2.3 seconds in order to improve strength during functional activities.. Baseline: see objective Goal status: INITIAL  6. Pt will demonstrate the ability to score at least a 22/30 on FGA to demonstrate decreased fall risk during ambulation and  functional mobility. Baseline: see objective Goal status: Added 07/30/24  PLAN:  PT FREQUENCY: 1-2x/week  PT DURATION: 6 weeks  PLANNED INTERVENTIONS: 97110-Therapeutic exercises, 97530- Therapeutic activity, 97112- Neuromuscular re-education, 97535- Self Care, [id number]- Manual therapy, [phone]- Gait training, Patient/Family education, Balance training, Stair training, Joint mobilization, DME instructions, Cryotherapy, and Moist heat  PLAN FOR NEXT SESSION: Improve knee extension range and progress RLE strengthening, progress gait training with LRAD.     Lang Ada, PT, DPT [facility] Office: [phone] 11:33 AM, 08/12/24

## 2024-08-14 ENCOUNTER — Encounter (HOSPITAL_COMMUNITY): Payer: Self-pay

## 2024-08-14 ENCOUNTER — Ambulatory Visit

## 2024-08-14 ENCOUNTER — Ambulatory Visit: Admitting: Pulmonary Disease

## 2024-08-14 ENCOUNTER — Ambulatory Visit (HOSPITAL_COMMUNITY)

## 2024-08-14 DIAGNOSIS — I5022 Chronic systolic (congestive) heart failure: Secondary | ICD-10-CM | POA: Diagnosis not present

## 2024-08-14 DIAGNOSIS — M25561 Pain in right knee: Secondary | ICD-10-CM

## 2024-08-14 DIAGNOSIS — Z7409 Other reduced mobility: Secondary | ICD-10-CM | POA: Diagnosis not present

## 2024-08-14 LAB — CUP PACEART REMOTE DEVICE CHECK
Battery Remaining Longevity: 58 mo
Battery Remaining Percentage: 93 %
Battery Voltage: 2.99 V
Brady Statistic AP VP Percent: 1 %
Brady Statistic AP VS Percent: 1 %
Brady Statistic AS VP Percent: 98 %
Brady Statistic AS VS Percent: 1 %
Brady Statistic RA Percent Paced: 1 %
Date Time Interrogation Session: 20250923165305
HighPow Impedance: 51 Ohm
Implantable Lead Connection Status: 753985
Implantable Lead Connection Status: 753985
Implantable Lead Connection Status: 753985
Implantable Lead Implant Date: 20220318
Implantable Lead Implant Date: 20250625
Implantable Lead Implant Date: 20250625
Implantable Lead Location: 753858
Implantable Lead Location: 753859
Implantable Lead Location: 753860
Implantable Pulse Generator Implant Date: 20250625
Lead Channel Impedance Value: 360 Ohm
Lead Channel Impedance Value: 460 Ohm
Lead Channel Impedance Value: 700 Ohm
Lead Channel Pacing Threshold Amplitude: 0.5 V
Lead Channel Pacing Threshold Amplitude: 0.75 V
Lead Channel Pacing Threshold Amplitude: 1.75 V
Lead Channel Pacing Threshold Pulse Width: 0.5 ms
Lead Channel Pacing Threshold Pulse Width: 0.5 ms
Lead Channel Pacing Threshold Pulse Width: 1 ms
Lead Channel Sensing Intrinsic Amplitude: 12 mV
Lead Channel Sensing Intrinsic Amplitude: 2.4 mV
Lead Channel Setting Pacing Amplitude: 1.5 V
Lead Channel Setting Pacing Amplitude: 3.5 V
Lead Channel Setting Pacing Amplitude: 3.5 V
Lead Channel Setting Pacing Pulse Width: 0.5 ms
Lead Channel Setting Pacing Pulse Width: 1 ms
Lead Channel Setting Sensing Sensitivity: 0.5 mV
Pulse Gen Serial Number: 211047337
Zone Setting Status: 755011

## 2024-08-14 NOTE — Therapy (Signed)
 OUTPATIENT PHYSICAL THERAPY LOWER EXTREMITY TREATMENT   Patient Name: Katherine Larson MRN: [id number] DOB:May 27, 1946, 78 y.o., female Today's Date: 08/14/2024  END OF SESSION:  PT End of Session - 08/14/24 1033     Visit Number 5    Number of Visits 12    Date for Recertification  08/28/24    Authorization Type [employer] MEDICARE    Authorization Time Period uhc covered 10 visits from 07/17/24-08/21/2024    Authorization - Visit Number 4    Authorization - Number of Visits 10    Progress Note Due on Visit 10    PT Start Time 1033    PT Stop Time 1115    PT Time Calculation (min) 42 min    Activity Tolerance Patient tolerated treatment well    Behavior During Therapy WFL for tasks assessed/performed           Past Medical History:  Diagnosis Date   AICD (automatic cardioverter/defibrillator) present    Anemia    anemia of chronic disease +/- IDA followed by Dr. Gatha. received iron  infusions in the past   Borderline glaucoma    CAD (coronary artery disease)    a. s/p CABG 1996. b. low risk nuc 2011. c. LHC 04/2016 due to drop in EF -> occluded native LAD,  widely patent sequential LIMA-D1-LAD.   Chronic kidney disease    mild stage of kidney disease   Chronic systolic CHF (congestive heart failure) (HCC)    Complication of anesthesia    DM2 (diabetes mellitus, type 2) (HCC)    not on any medicine for this at this time, 05/2016   Dyspnea    with exertion   GERD (gastroesophageal reflux disease)    Gout    History of blood transfusion    History of hiatal hernia    in 20s   History of pneumonia    March, 2016   HLD (hyperlipidemia)    HTN (hypertension)    Inappropriate sinus tachycardia    LBBB (left bundle branch block)    a. 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 Dx 04/2016 - EF 25-30%, diffuse HK, elevated  LVEDP, mild MR, mod LAE.   Normocytic anemia 10/17/2021   NSVT (nonsustained ventricular tachycardia) (HCC)    PAT (paroxysmal atrial tachycardia)    Pneumonia    PONV (postoperative nausea and vomiting)    after just about every surgery I've had   Rheumatoid arthritis (HCC)    RA- hands   Seasonal allergies    Past Surgical History:  Procedure Laterality Date   ABDOMINAL HYSTERECTOMY     APPENDECTOMY     BACK SURGERY     BIOPSY  04/27/2020   Procedure: BIOPSY;  Surgeon: Shaaron Lamar HERO, MD;  Location: [facility] ENDO SU[facility];  Service: Endoscopy;;  ascending colon biopsy   BIV UPGRADE N/A 05/14/2024   Procedure: BIV UPGRADE;  Surgeon: Waddell Danelle ORN, MD;  Location: [facility] INVASIVE CV LAB;  Service: Cardiovascular;  Laterality: N/A;   CARDIAC CATHETERIZATION N/A 05/01/2016   Procedure: Right/Left Heart Cath and Coronary/Graft Angiography;  Surgeon: Alm ORN Clay, MD;  Location: [facility] INVASIVE CV LAB;  Service: Cardiovascular;  Laterality: N/A;   CHOLECYSTECTOMY     COLONOSCOPY  11/2011   Dr. Magod:ext/int hemorrhoids, diverticulosis sigmoid colon and distal  desc colon, mid-desc colon hyperplastic polyps.    COLONOSCOPY WITH PROPOFOL  N/A 04/27/2020   Rourk: Diverticulosis, hemorrhoids, normal terminal ileum.  Random colon biopsies significant for chronic lymphocytic colitis. No repeat due to age.   CORONARY ARTERY BYPASS GRAFT  1996   2 vessels   ESOPHAGOGASTRODUODENOSCOPY  11/2011   Dr. Rosalie: small hiatal hernia, one non-bleeding superficial gastric ulcer, medium-sized diverticulum in area of papilla   ESOPHAGOGASTRODUODENOSCOPY N/A 12/29/2015   Dr. Shaaron: Chronic inactive gastritis, normal esophagus status post dilation, duodenal diverticula   ESOPHAGOGASTRODUODENOSCOPY (EGD) WITH PROPOFOL  N/A 07/24/2019   Dr. Shaaron: Normal esophagus status post empiric dilation, small hiatal hernia, large D2 diverticulum   ESOPHAGOGASTRODUODENOSCOPY (EGD) WITH PROPOFOL  N/A 06/03/2021   Surgeon: Shaaron Lamar HERO, MD; normal esophagus, normal stomach, normal duodenum.  Abnormal hypopharyngeal mucosa bilaterally just distal to the base of the tongue (right side greater than left), overlying mucosa appeared irregular and somewhat verrucous in appearance.  Recommended ENT evaluation.   EYE SURGERY Bilateral    cataract removal   FOOT SURGERY     removal bone spur   FRACTURE SURGERY Right    arm   GIVENS CAPSULE STUDY  11/2012   Dr. Rosalie: minimal gastritis, normal small bowel capsule   HERNIA REPAIR     hiatal hernia   ICD IMPLANT N/A 02/04/2021   Procedure: ICD IMPLANT;  Surgeon: Waddell Danelle ORN, MD;  Location: [facility] INVASIVE CV LAB;  Service: Cardiovascular;  Laterality: N/A;   MALONEY DILATION N/A 07/24/2019   Procedure: AGAPITO DILATION;  Surgeon: Shaaron Lamar HERO, MD;  Location: [facility];  Service: Endoscopy;  Laterality: N/A;   MALONEY DILATION N/A 06/03/2021   Procedure: AGAPITO DILATION;  Surgeon: Shaaron Lamar HERO, MD;  Location: [facility];  Service: Endoscopy;  Laterality: N/A;   MAXIMUM ACCESS (MAS)POSTERIOR LUMBAR INTERBODY FUSION (PLIF) 1 LEVEL N/A 08/14/2013   Procedure:  MAXIMUM ACCESS SURGERY(MAS) POSTERIOR LUMBAR INTERBODY FUSION LUMBAR THREE-FOUR ;  Surgeon: Alm GORMAN Molt, MD;  Location: [facility] NEURO ORS;  Service: Neurosurgery;  Laterality: N/A;   MAXIMUM ACCESS SURGERY(MAS) POSTERIOR LUMBAR INTERBODY FUSION LUMBAR THREE-FOUR    PTCA     RIGHT/LEFT HEART CATH AND CORONARY ANGIOGRAPHY N/A 03/26/2020   Procedure: RIGHT/LEFT HEART CATH AND CORONARY ANGIOGRAPHY;  Surgeon: Anner Alm ORN, MD;  Location: [facility] INVASIVE CV LAB;  Service: Cardiovascular;  Laterality: N/A;   SPINAL CORD STIMULATOR BATTERY EXCHANGE N/A 01/19/2021   Procedure: Spinal cord stimulator battery change;  Surgeon: Darlis Deatrice GORMAN, MD;  Location: [facility] OR;  Service: Neurosurgery;  Laterality: N/A;   SPINAL CORD STIMULATOR INSERTION N/A 06/16/2016   Procedure: LUMBAR SPINAL CORD STIMULATOR INSERTION;  Surgeon: Deward Fabian, MD;  Location: [facility] NEURO ORS;  Service: Neurosurgery;  Laterality: N/A;  LUMBAR SPINAL CORD STIMULATOR INSERTION   TEE WITHOUT CARDIOVERSION N/A 02/01/2023   Procedure: TRANSESOPHAGEAL ECHOCARDIOGRAM (TEE);  Surgeon: Debera Jayson MATSU, MD;  Location: [facility] ORS;  Service: Cardiovascular;  Laterality: N/A;   tens  05/2016   TONSILLECTOMY     TOTAL KNEE ARTHROPLASTY Right 06/30/2024   Procedure: ARTHROPLASTY, KNEE, TOTAL;  Surgeon: Yvone Rush, MD;  Location: [facility] ORS;  Service: Orthopedics;  Laterality: Right;  RIGHT TOTAL KNEE ARTHROPLASTY   Patient Active Problem List   Diagnosis Date Noted   Arthritis of right knee 06/30/2024   Primary osteoarthritis of right knee 06/27/2024   Bacteremia 02/01/2023   Pseudomonas sepsis (HCC) 01/30/2023   Acute respiratory failure with hypoxia (HCC) 01/28/2023   Multifocal pneumonia  01/28/2023   Melena 11/28/2022   Rectal bleeding 11/28/2022   Upper abdominal pain 11/28/2022   Early satiety 07/27/2022   Nausea without vomiting 07/27/2022   Hypotension 02/10/2022   Normocytic anemia 10/17/2021   Lymphocytic colitis 07/02/2020   Coronary artery disease, occlusive: CTO proxLAD 03/26/2020   Abnormal nuclear stress test 03/26/2020   Chronic diarrhea 03/02/2020   NICM- new drop in EF 25-30% 05/03/2016   Acute pulmonary edema (HCC)    LBBB (left bundle branch block) 04/29/2016   Chronic HFrEF (heart failure with reduced ejection fraction) (HCC) 04/29/2016   Abdominal pain, chronic, epigastric 03/07/2016   Septic shock (HCC)    COPD exacerbation (HCC) 01/21/2016   Hyperthyroidism 01/21/2016   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   Hypokalemia 01/19/2016   Hyponatremia 01/19/2016   Diet-controlled diabetes mellitus (HCC) 01/19/2016   CAP (community acquired pneumonia) 01/18/2016   Mucosal abnormality of stomach    Dysphagia    Constipation 12/08/2015   Gastroesophageal reflux disease 12/08/2015   Abnormal CT scan, esophagus 12/08/2015   Esophageal  dysphagia 12/08/2015   Abdominal pain, epigastric 12/08/2015   Loss of weight 12/08/2015   Lower abdominal pain 12/08/2015   Back pain 08/26/2015   Headache 08/26/2015   Essential hypertension 08/26/2015   DM type 2 (diabetes mellitus, type 2) (HCC) 08/26/2015   Sinus tachycardia 06/07/2015   Diarrhea 06/07/2015   Preop cardiovascular exam 07/16/2013   Atrial flutter (HCC) 07/16/2013   Anemia 01/08/2013   Leukocytosis 12/17/2012   DYSPNEA 02/02/2010   DIABETES MELLITUS 07/28/2009   Hyperlipidemia 07/28/2009   CHOLECYSTECTOMY, HX OF 07/28/2009   Hx of CABG '96- cath 05/01/16 07/28/2009   HERNIORRHAPHY, HX OF 07/28/2009    PCP: Teresa Channel, MD   REFERRING PROVIDER: Yvone Rush, MD  REFERRING DIAG: s/p rt total knee replacement  THERAPY DIAG:  Right knee pain, unspecified chronicity  Impaired functional mobility, balance, gait, and endurance  Rationale for Evaluation and Treatment: Rehabilitation  ONSET DATE: 06/30/24 RTKA  SUBJECTIVE:   SUBJECTIVE STATEMENT: Reports she has not been completing her exercises as has been in [facility].  Stated knee has been swelling some, ice helps.    Eval:  Pt states she had the surgery the 11th. Pt states she had home health PT about 6 times and had some issues with the oxygen . Previously she jut wore oxygen  at night. Pt has many ongoing health issues and has been struggling with symptoms of UTI and diarrhea the past few days.   PERTINENT HISTORY: -Been using husbands supplemental O2 since home therapy, had just been using it at night before the surgery PAIN:  Are you having pain? Yes: NPRS scale: 3/10 Pain location: right knee Pain description: ache Aggravating factors: being up on it Relieving factors: rest elevated  PRECAUTIONS: Fall  RED FLAGS: UTI symptoms during urination   WEIGHT BEARING RESTRICTIONS: WBAT  FALLS:  Has patient fallen in last 6 months? No  LIVING ENVIRONMENT: Lives with: lives with  their spouse Lives in: House/apartment Stairs: No Has following equipment at home: Environmental consultant - 2 wheeled  OCCUPATION: retired, Biomedical engineer  PLOF: Requires assistive device for independence, Needs assistance with ADLs, and Needs assistance with gait  PATIENT GOALS: walk faster, decrease the knee pain, improve balance, increase LE strength  NEXT MD VISIT: Next Tuesday for Oxygen    OBJECTIVE:  Note: Objective measures were completed at Evaluation unless otherwise noted.  DIAGNOSTIC FINDINGS: none  PATIENT SURVEYS:  LEFS : 35/80  COGNITION: Overall cognitive status: Within  functional limits for tasks assessed     SENSATION: Light touch: Impaired numbness around incision  EDEMA:  RLE knee and ankle  PALPATION: Tenderness to palpation at posterior knee and incision area.  LOWER EXTREMITY ROM:  Active ROM Right eval Left eval Right 07/29/24  Hip flexion     Hip extension     Hip abduction     Hip adduction     Hip internal rotation     Hip external rotation     Knee flexion 121  130  Knee extension 3 from neutral  3  Ankle dorsiflexion     Ankle plantarflexion     Ankle inversion     Ankle eversion      (Blank rows = not tested)  LOWER EXTREMITY MMT:  MMT Right 07/29/24 Left 07/29/24  Hip flexion 3+ 3/5  Hip extension Cannot tolerate prone, complete sidelying next session. Cannot tolerate prone, complete sidelying next session.  Hip abduction 3/5 3/5  Hip adduction    Hip internal rotation    Hip external rotation    Knee flexion    Knee extension    Ankle dorsiflexion    Ankle plantarflexion    Ankle inversion    Ankle eversion     (Blank rows = not tested)    FUNCTIONAL TESTS:  5 times sit to stand: 16.48 seconds, 94% O2, with [place] 2 minute walk test: 185 feet, 99% O2 with [place]  GAIT: Distance walked: 200 feet Assistive device utilized: Environmental consultant - 2 wheeled Level of assistance: Modified independence Comments: Pt requires therapist assist to carry  supplemental O2 supply, decreased gait speed noted, decreased visual acuity noted, and slight antalgic pattern noted with decreased stance time on RLE although minimal.                                                                                                                                 TREATMENT DATE:  08/14/24: Nustep seat 8 x 5' Standing by // bars:  - Heel raises incline slope 20x   - Toe raises decline slope 20x  - STS too difficult standard height, added foam and cueing for foot position and nose over toes with ability to complete 2x 10  -TKE RTB 15x5  - 6in lateral step up 2x 10  - 6in forward up and over10x   - Leg press 3Pl 2x 10  Supine: AROM 1-134 degrees   08/12/2024  Therapeutic Exercise: -Stationary bike, 4 minutes, pt able to power up bike immediately, pt cued for increased pace, level 4 resistance -Hamstring curls, 3 sets of 10 reps, plate 3>4, pt cued for increased eccentric control and decreased knee valgus -Leg press, 2 sets 7 reps, plate 3, pt cued for increased eccentric control -Lateral stepping with RTB at knees, pt cued for increased step length and increased eccentric control, 3 laps 20 steps per lap -Heel/toe raises, 1 set of 20 reps, pt cued for  increased ROM Neuromuscular Re-education: -Aeromat walks, lateral/tandem stepping, 2 laps each variation in parallel bars, pt cued for proper form and LE placement -Fwd lunges on bosu ball, pt cued for one UE support, 1 set of 7 reps bilaterally Therapeutic Activity: -Step up and overs, 8 inch step, 1 set of 5 reps, pt cued for increased step length -Lateral step up and overs, 8 inch step, 1 set of 5 reps, pt cued for increased foot clearance  07/31/2024  Therapeutic Exercise: -Stationary bike, 3 minutes, pt able to power up bike after a minute and a half -Hamstring curls, 2 sets of 10 reps, plate 1, blocker on setting 3 to decreased strain on knee extension -Leg press, 2 sets 10 reps, plate 2, pt cued  for increased eccentric control Neuromuscular Re-education: -Single leg stance, 2 trials bilaterally, L able to hold for 3 seconds, R able to hold less than 1 second -TKE on 4 inch step, 2 sets of 7 reps RLE only, pt cued for LLE sequencing Therapeutic Activity: -Step up and overs, 6 inch step, 1 set of 7 reps, pt cued for decreased speed for decreased dizziness   07/29/24: Vitals: O2 sat 99-100% HR 88 room air Reviewed goals Educated importance of HEP compliance  MMT see above  Supine: with wedge and 2 pillows under bed  Quad sets 10x  SAQ on half bolster  Bridge 10x5 AROM 3-130 degrees Standing:  SLS unable   Heel raises 10x  Toe raises 10x Vitals:  97%, HR 85 bpm   PATIENT EDUCATION:  Education details: Pt was educated on findings of PT evaluation, prognosis, frequency of therapy visits and rationale, attendance policy, and HEP if given.   Person educated: Patient and Spouse Education method: Explanation, Verbal cues, and Handouts Education comprehension: verbalized understanding, verbal cues required, and needs further education  HOME EXERCISE PROGRAM: Access Code: [email or web address] URL: [email or web address] Date: 07/17/2024 Prepared by: Lang Ada  Exercises - Seated Knee Extension AROM  - 1 x daily - 7 x weekly - 3 sets - 10 reps - Sit to Stand with Armchair  - 1 x daily - 7 x weekly - 3 sets - 10 reps - Heel Raises with Counter Support  - 1 x daily - 7 x weekly - 3 sets - 10 reps  07/29/24: Bridge 10x 5  ASSESSMENT:  CLINICAL IMPRESSION: Session focus with knee mobility to improve extension and functional strengthening.  Began session on Nustep for dynamic warm up and promote quad activation.  Began STS without HHA, required cueing for seated posture and to pull feet underneath to assist iwht standing, unable to complete standard height so added foam with increased ease.  Pt's husband is in hospital and admits to decreased compliance with HEP.   Encouraged pt to try and complete more regularly.  Attempted step up training with decreased HHA that was too difficult, continue with 2 UE support during step up/down training.  AROM 1-134 degrees.  No reports of pain through session, was limited by fatigue.    Eval:  Patient is a 78 y.o. female who was seen today for physical therapy evaluation and treatment for s/p rt total knee replacement. Patient demonstrates decreased RLE strength, abnormal pain rating, decreased endurance, and impaired balance. Pt reports having symptoms of UTI and drop in O2 saturation into the 30s during home health PT session and has been using spouses old supplemental O2 tank for community ambulation. O2 levels stable throughout session today with supplemental O2, pt states she has  appontment with primary care next Tuesday to address abnormal O2 levels. Patient also demonstrates difficulty with ambulation during today's session with need for therapist assist for supplemental O2 tank carrry and RW for balance, decreased stride length and velocity noted. Patient also demonstrates 121 degrees of knee flexion with no signs of pain and 3 degrees from neutral extension. Pt demonstrates tenderness to palpation to knee cap although mobility WFL, and posterior knee. Pt unable to stand without UE support for assist this date. Patient requires education on importance of HEP compliance, DVT, prognosis and role of PT. Patient would benefit from skilled physical therapy for decreased R knee pain, increased endurance with ambulation, increased RLE strength, and balance for improved gait quality, return to higher level of function with ADLs, and progress towards therapy goals.  OBJECTIVE IMPAIRMENTS: Abnormal gait, decreased activity tolerance, decreased balance, decreased endurance, decreased knowledge of use of DME, decreased mobility, difficulty walking, decreased ROM, decreased strength, impaired sensation, and pain.   ACTIVITY LIMITATIONS:  carrying, lifting, bending, standing, squatting, sleeping, stairs, transfers, and bed mobility  PARTICIPATION LIMITATIONS: meal prep, laundry, interpersonal relationship, shopping, community activity, and yard work  PERSONAL FACTORS: Age, Fitness, Past/current experiences, and Time since onset of injury/illness/exacerbation are also affecting patient's functional outcome.   REHAB POTENTIAL: Good  CLINICAL DECISION MAKING: Stable/uncomplicated  EVALUATION COMPLEXITY: Low   GOALS: Goals reviewed with patient? No  SHORT TERM GOALS: Target date: 08/07/24  Patient will demonstrate evidence of independence with individualized HEP and will report compliance for at least 3 days per week for optimized progression towards remaining therapy goals. Baseline:  Goal status: INITIAL  2.  Patient will report a decrease in pain level during community ambulation by at least 2 points for improved quality of life. Baseline: 3/10 Goal status: INITIAL     LONG TERM GOALS: Target date: 08/28/24  Pt will demonstrate a an increase of at least 9 points on the LEFS for improved performance of community ambulation and ADL. Baseline: see objective Goal status: INITIAL  2.  Pt will improve 2 MWT by 50 feet in order to demonstrate improved functional ambulatory capacity in community setting.  Baseline: see objective Goal status: INITIAL  3.  Pt will demonstrate WFL ROM (flexion and extension) in right knee, for increased mobility and maximal efficiency of gait cycle during ambulation. Baseline: see objective Goal status: INITIAL  4.  Pt will demonstrate at least 4-/5 MMT for right lower extremity for increased strength during ADL and community ambulation. Baseline: see objective Goal status: INITIAL  5.  Pt will improve 5TSTS by at least 2.3 seconds in order to improve strength during functional activities.. Baseline: see objective Goal status: INITIAL  6. Pt will demonstrate the ability to score  at least a 22/30 on FGA to demonstrate decreased fall risk during ambulation and functional mobility. Baseline: see objective Goal status: Added 07/30/24  PLAN:  PT FREQUENCY: 1-2x/week  PT DURATION: 6 weeks  PLANNED INTERVENTIONS: 97110-Therapeutic exercises, 97530- Therapeutic activity, 97112- Neuromuscular re-education, 97535- Self Care, [id number]- Manual therapy, [phone]- Gait training, Patient/Family education, Balance training, Stair training, Joint mobilization, DME instructions, Cryotherapy, and Moist heat  PLAN FOR NEXT SESSION: Improve knee extension range and progress RLE strengthening, progress gait training with LRAD.  Begin squats and gluteal strengthening exercises.   Augustin Mclean, LPTA/CLT; CBIS [phone]  11:34 AM, 08/14/24

## 2024-08-16 DIAGNOSIS — N10 Acute tubulo-interstitial nephritis: Secondary | ICD-10-CM | POA: Diagnosis not present

## 2024-08-17 ENCOUNTER — Ambulatory Visit: Payer: Self-pay | Admitting: Internal Medicine

## 2024-08-18 ENCOUNTER — Ambulatory Visit: Attending: Internal Medicine

## 2024-08-18 DIAGNOSIS — Z95810 Presence of automatic (implantable) cardiac defibrillator: Secondary | ICD-10-CM

## 2024-08-18 DIAGNOSIS — I5022 Chronic systolic (congestive) heart failure: Secondary | ICD-10-CM

## 2024-08-18 DIAGNOSIS — N390 Urinary tract infection, site not specified: Secondary | ICD-10-CM | POA: Diagnosis not present

## 2024-08-18 NOTE — Progress Notes (Signed)
 Remote ICD Transmission

## 2024-08-19 ENCOUNTER — Encounter (HOSPITAL_COMMUNITY): Payer: Self-pay

## 2024-08-19 ENCOUNTER — Ambulatory Visit (HOSPITAL_COMMUNITY)

## 2024-08-19 DIAGNOSIS — Z7409 Other reduced mobility: Secondary | ICD-10-CM | POA: Diagnosis not present

## 2024-08-19 DIAGNOSIS — M25561 Pain in right knee: Secondary | ICD-10-CM | POA: Diagnosis not present

## 2024-08-19 NOTE — Progress Notes (Signed)
 EPIC Encounter for ICM Monitoring  Patient Name: Katherine Larson is a 78 y.o. female Date: 08/19/2024 Primary Care Physican: Teresa Channel, MD Primary Cardiologist: Rolan Electrophysiologist: Waddell 01/25/2024 Office Weight: 126 lbs     04/17/2024 Office Weight: 128 lbs    06/24/2024 Weight: 128 lbs (125 lbs baseline)        07/07/2024 Weight: 128 lbs (125 lbs on 8/17)                                         Spoke with patient and heart failure questions reviewed.  Transmission results reviewed.  Pt asymptomatic for fluid accumulation.  She currently has UTI.  Husband is currently in hospital and hopes to go to rehab this week.    Since 07/14/2024 ICM Remote Transmission:  CorVue thoracic impedance suggesting normal fluid levels with the exception of possible fluid accumulation starting 08/15/2024 and returned close to baseline 08/18/2024.    Prescribed:  Furosemide  40 mg take 0.5 tablet(s) (20 mg total) by mouth daily.   Labs: 07/24/2024 Creatinine 1.20, BUN 29, Potassium 4.8, Sodium 137, GFR 47 07/08/2024 Creatinine 1.34, BUN 28, Potassium 4.8, Sodium 139, GFR 41 06/18/2024 Creatinine 0.92, BUN 25, Potassium 4.3, Sodium 140, GFR >60 06/16/2024 Creatinine 0.99, BUN 27, Potassium 4.2, Sodium 136, GFR 59 04/25/2024 Creatinine 1.18, BUN 27, Potassium 4.7, Sodium 140, GFR 48  04/17/2024 Creatinine 1.25, BUN 32, Potassium 5.5, Sodium 136, GFR 45  03/28/2024 Creatinine 1.11, BUN 28, Potassium 5.3, Sodium 141, GFR 52  03/17/2024 Creatinine 1.39, BUN 34, Potassium 4.6, Sodium 140 12/27/2023 Creatinine 1.04, BUN 32, Potassium 4.4, Sodium 137, GFR 56  11/22/2023 Creatinine 1.17, BUN 33, Potassium 4.5, Sodium 135, GFR 48  A complete set of results can be found in Results Review.   Recommendations:  No changes and encouraged to call if experiencing any fluid symptoms.   Follow-up plan: ICM clinic phone appointment on 09/22/2024.   91 day device clinic remote transmission 11/13/2024.     EP/Cardiology  Office Visits:   10/08/2024 with HF clinc.  09/01/2024 with Charlies Riling, PA.   Copy of ICM check sent to Dr. Waddell.  Remote monitoring is medically necessary for Heart Failure Management.    90 day Daily Thoracic Impedance ICM trend: 05/19/2024 through 08/18/2024.    12-14 Month Thoracic Impedance ICM trend:     Katherine GORMAN Garner, RN 08/19/2024 1:26 PM

## 2024-08-19 NOTE — Therapy (Signed)
 OUTPATIENT PHYSICAL THERAPY LOWER EXTREMITY TREATMENT   Patient Name: Katherine Larson MRN: [id number] DOB:1946/01/25, 78 y.o., female Today's Date: 08/19/2024  END OF SESSION:  PT End of Session - 08/19/24 1127     Visit Number 6    Number of Visits 12    Date for Recertification  08/28/24    Authorization Type [employer] MEDICARE    Authorization Time Period [facility] covered 10 visits from 07/17/24-08/21/2024    Authorization - Visit Number 5    Authorization - Number of Visits 10    Progress Note Due on Visit 10    PT Start Time 1120    PT Stop Time 1200    PT Time Calculation (min) 40 min    Equipment Utilized During Treatment Gait belt    Activity Tolerance Patient tolerated treatment well    Behavior During Therapy WFL for tasks assessed/performed           Past Medical History:  Diagnosis Date   AICD (automatic cardioverter/defibrillator) present    Anemia    anemia of chronic disease +/- IDA followed by Dr. Gatha. received iron  infusions in the past   Borderline glaucoma    CAD (coronary artery disease)    a. s/p CABG 1996. b. low risk nuc 2011. c. LHC 04/2016 due to drop in EF -> occluded native LAD,  widely patent sequential LIMA-D1-LAD.   Chronic kidney disease    mild stage of kidney disease   Chronic systolic CHF (congestive heart failure) (HCC)    Complication of anesthesia    DM2 (diabetes mellitus, type 2) (HCC)    not on any medicine for this at this time, 05/2016   Dyspnea    with exertion   GERD (gastroesophageal reflux disease)    Gout    History of blood transfusion    History of hiatal hernia    in 20s   History of pneumonia    March, 2016   HLD (hyperlipidemia)    HTN (hypertension)    Inappropriate sinus tachycardia    LBBB (left bundle branch block)    a. 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    1996   Neuropathy    Neuropathy    NICM (nonischemic cardiomyopathy) (HCC)     a. Dx 04/2016 - EF 25-30%, diffuse HK, elevated LVEDP, mild MR, mod LAE.   Normocytic anemia 10/17/2021   NSVT (nonsustained ventricular tachycardia) (HCC)    PAT (paroxysmal atrial tachycardia)    Pneumonia    PONV (postoperative nausea and vomiting)    after just about every surgery I've had   Rheumatoid arthritis (HCC)    RA- hands   Seasonal allergies    Past Surgical History:  Procedure Laterality Date   ABDOMINAL HYSTERECTOMY     APPENDECTOMY     BACK SURGERY     BIOPSY  04/27/2020   Procedure: BIOPSY;  Surgeon: Shaaron Lamar HERO, MD;  Location: [facility];  Service: Endoscopy;;  ascending colon biopsy   BIV UPGRADE N/A 05/14/2024   Procedure: BIV UPGRADE;  Surgeon: Waddell Danelle ORN, MD;  Location: [facility] INVASIVE CV LAB;  Service: Cardiovascular;  Laterality: N/A;   CARDIAC CATHETERIZATION N/A 05/01/2016   Procedure: Right/Left Heart Cath and Coronary/Graft Angiography;  Surgeon: Alm ORN Clay, MD;  Location: [facility] INVASIVE CV LAB;  Service: Cardiovascular;  Laterality: N/A;   CHOLECYSTECTOMY     COLONOSCOPY  11/2011  Dr. Magod:ext/int hemorrhoids, diverticulosis sigmoid colon and distal desc colon, mid-desc colon hyperplastic polyps.    COLONOSCOPY WITH PROPOFOL  N/A 04/27/2020   Rourk: Diverticulosis, hemorrhoids, normal terminal ileum.  Random colon biopsies significant for chronic lymphocytic colitis. No repeat due to age.   CORONARY ARTERY BYPASS GRAFT  1996   2 vessels   ESOPHAGOGASTRODUODENOSCOPY  11/2011   Dr. Rosalie: small hiatal hernia, one non-bleeding superficial gastric ulcer, medium-sized diverticulum in area of papilla   ESOPHAGOGASTRODUODENOSCOPY N/A 12/29/2015   Dr. Shaaron: Chronic inactive gastritis, normal esophagus status post dilation, duodenal diverticula   ESOPHAGOGASTRODUODENOSCOPY (EGD) WITH PROPOFOL  N/A 07/24/2019   Dr. Shaaron: Normal esophagus status post empiric dilation, small hiatal hernia, large D2 diverticulum   ESOPHAGOGASTRODUODENOSCOPY (EGD)  WITH PROPOFOL  N/A 06/03/2021   Surgeon: Shaaron Lamar HERO, MD; normal esophagus, normal stomach, normal duodenum.  Abnormal hypopharyngeal mucosa bilaterally just distal to the base of the tongue (right side greater than left), overlying mucosa appeared irregular and somewhat verrucous in appearance.  Recommended ENT evaluation.   EYE SURGERY Bilateral    cataract removal   FOOT SURGERY     removal bone spur   FRACTURE SURGERY Right    arm   GIVENS CAPSULE STUDY  11/2012   Dr. Rosalie: minimal gastritis, normal small bowel capsule   HERNIA REPAIR     hiatal hernia   ICD IMPLANT N/A 02/04/2021   Procedure: ICD IMPLANT;  Surgeon: Waddell Danelle ORN, MD;  Location: [facility] INVASIVE CV LAB;  Service: Cardiovascular;  Laterality: N/A;   MALONEY DILATION N/A 07/24/2019   Procedure: AGAPITO DILATION;  Surgeon: Shaaron Lamar HERO, MD;  Location: [facility];  Service: Endoscopy;  Laterality: N/A;   MALONEY DILATION N/A 06/03/2021   Procedure: AGAPITO DILATION;  Surgeon: Shaaron Lamar HERO, MD;  Location: [facility];  Service: Endoscopy;  Laterality: N/A;   MAXIMUM ACCESS (MAS)POSTERIOR LUMBAR INTERBODY FUSION (PLIF) 1 LEVEL N/A 08/14/2013   Procedure:  MAXIMUM ACCESS SURGERY(MAS) POSTERIOR LUMBAR INTERBODY FUSION LUMBAR THREE-FOUR ;  Surgeon: Alm GORMAN Molt, MD;  Location: [facility];  Service: Neurosurgery;  Laterality: N/A;   MAXIMUM ACCESS SURGERY(MAS) POSTERIOR LUMBAR INTERBODY FUSION LUMBAR THREE-FOUR    PTCA     RIGHT/LEFT HEART CATH AND CORONARY ANGIOGRAPHY N/A 03/26/2020   Procedure: RIGHT/LEFT HEART CATH AND CORONARY ANGIOGRAPHY;  Surgeon: Anner Alm ORN, MD;  Location: [facility] INVASIVE CV LAB;  Service: Cardiovascular;  Laterality: N/A;   SPINAL CORD STIMULATOR BATTERY EXCHANGE N/A 01/19/2021   Procedure: Spinal cord stimulator battery change;  Surgeon: Darlis Deatrice GORMAN, MD;  Location: [facility] OR;  Service: Neurosurgery;  Laterality: N/A;   SPINAL CORD STIMULATOR INSERTION N/A 06/16/2016   Procedure: LUMBAR  SPINAL CORD STIMULATOR INSERTION;  Surgeon: Deward Fabian, MD;  Location: [facility];  Service: Neurosurgery;  Laterality: N/A;  LUMBAR SPINAL CORD STIMULATOR INSERTION   TEE WITHOUT CARDIOVERSION N/A 02/01/2023   Procedure: TRANSESOPHAGEAL ECHOCARDIOGRAM (TEE);  Surgeon: Debera Jayson MATSU, MD;  Location: [facility] OR[facility];  Service: Cardiovascular;  Laterality: N/A;   tens  05/2016   TONSILLECTOMY     TOTAL KNEE ARTHROPLASTY Right 06/30/2024   Procedure: ARTHROPLASTY, KNEE, TOTAL;  Surgeon: Yvone Rush, MD;  Location: [facility] ORS;  Service: Orthopedics;  Laterality: Right;  RIGHT TOTAL KNEE ARTHROPLASTY   Patient Active Problem List   Diagnosis Date Noted   Arthritis of right knee 06/30/2024   Primary osteoarthritis of right knee 06/27/2024   Bacteremia 02/01/2023   Pseudomonas sepsis (HCC) 01/30/2023   Acute respiratory failure  with hypoxia (HCC) 01/28/2023   Multifocal pneumonia 01/28/2023   Melena 11/28/2022   Rectal bleeding 11/28/2022   Upper abdominal pain 11/28/2022   Early satiety 07/27/2022   Nausea without vomiting 07/27/2022   Hypotension 02/10/2022   Normocytic anemia 10/17/2021   Lymphocytic colitis 07/02/2020   Coronary artery disease, occlusive: CTO proxLAD 03/26/2020   Abnormal nuclear stress test 03/26/2020   Chronic diarrhea 03/02/2020   NICM- new drop in EF 25-30% 05/03/2016   Acute pulmonary edema (HCC)    LBBB (left bundle branch block) 04/29/2016   Chronic HFrEF (heart failure with reduced ejection fraction) (HCC) 04/29/2016   Abdominal pain, chronic, epigastric 03/07/2016   Septic shock (HCC)    COPD exacerbation (HCC) 01/21/2016   Hyperthyroidism 01/21/2016   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   Hypokalemia 01/19/2016   Hyponatremia 01/19/2016   Diet-controlled diabetes mellitus (HCC) 01/19/2016   CAP (community acquired pneumonia) 01/18/2016   Mucosal abnormality of stomach    Dysphagia    Constipation 12/08/2015   Gastroesophageal reflux disease 12/08/2015    Abnormal CT scan, esophagus 12/08/2015   Esophageal dysphagia 12/08/2015   Abdominal pain, epigastric 12/08/2015   Loss of weight 12/08/2015   Lower abdominal pain 12/08/2015   Back pain 08/26/2015   Headache 08/26/2015   Essential hypertension 08/26/2015   DM type 2 (diabetes mellitus, type 2) (HCC) 08/26/2015   Sinus tachycardia 06/07/2015   Diarrhea 06/07/2015   Preop cardiovascular exam 07/16/2013   Atrial flutter (HCC) 07/16/2013   Anemia 01/08/2013   Leukocytosis 12/17/2012   DYSPNEA 02/02/2010   DIABETES MELLITUS 07/28/2009   Hyperlipidemia 07/28/2009   CHOLECYSTECTOMY, HX OF 07/28/2009   Hx of CABG '96- cath 05/01/16 07/28/2009   HERNIORRHAPHY, HX OF 07/28/2009    PCP: Teresa Channel, MD   REFERRING PROVIDER: Yvone Rush, MD  REFERRING DIAG: s/p rt total knee replacement  THERAPY DIAG:  Right knee pain, unspecified chronicity  Impaired functional mobility, balance, gait, and endurance  Rationale for Evaluation and Treatment: Rehabilitation  ONSET DATE: 06/30/24 RTKA  SUBJECTIVE:   SUBJECTIVE STATEMENT: Pt arrived with son this session.  Pt stated she hit something on side of knee, pain scale 5.5 today.  Pt with kidney and bladder infection, on antibiotics beginning Sunday.  Husband is getting sent to rehab this week.  Eval:  Pt states she had the surgery the 11th. Pt states she had home health PT about 6 times and had some issues with the oxygen . Previously she jut wore oxygen  at night. Pt has many ongoing health issues and has been struggling with symptoms of UTI and diarrhea the past few days.   PERTINENT HISTORY: -Been using husbands supplemental O2 since home therapy, had just been using it at night before the surgery PAIN:  Are you having pain? Yes: NPRS scale: 3/10 Pain location: right knee Pain description: ache Aggravating factors: being up on it Relieving factors: rest elevated  PRECAUTIONS: Fall  RED FLAGS: UTI symptoms during  urination   WEIGHT BEARING RESTRICTIONS: WBAT  FALLS:  Has patient fallen in last 6 months? No  LIVING ENVIRONMENT: Lives with: lives with their spouse Lives in: House/apartment Stairs: No Has following equipment at home: Environmental consultant - 2 wheeled  OCCUPATION: retired, Biomedical engineer  PLOF: Requires assistive device for independence, Needs assistance with ADLs, and Needs assistance with gait  PATIENT GOALS: walk faster, decrease the knee pain, improve balance, increase LE strength  NEXT MD VISIT: Next Tuesday for Oxygen    OBJECTIVE:  Note: Objective measures were completed  at Evaluation unless otherwise noted.  DIAGNOSTIC FINDINGS: none  PATIENT SURVEYS:  LEFS : 35/80  COGNITION: Overall cognitive status: Within functional limits for tasks assessed     SENSATION: Light touch: Impaired numbness around incision  EDEMA:  RLE knee and ankle  PALPATION: Tenderness to palpation at posterior knee and incision area.  LOWER EXTREMITY ROM:  Active ROM Right eval Left eval Right 07/29/24  Hip flexion     Hip extension     Hip abduction     Hip adduction     Hip internal rotation     Hip external rotation     Knee flexion 121  130  Knee extension 3 from neutral  3  Ankle dorsiflexion     Ankle plantarflexion     Ankle inversion     Ankle eversion      (Blank rows = not tested)  LOWER EXTREMITY MMT:  MMT Right 07/29/24 Left 07/29/24  Hip flexion 3+ 3/5  Hip extension Cannot tolerate prone, complete sidelying next session. Cannot tolerate prone, complete sidelying next session.  Hip abduction 3/5 3/5  Hip adduction    Hip internal rotation    Hip external rotation    Knee flexion    Knee extension    Ankle dorsiflexion    Ankle plantarflexion    Ankle inversion    Ankle eversion     (Blank rows = not tested)    FUNCTIONAL TESTS:  5 times sit to stand: 16.48 seconds, 94% O2, with [place] 2 minute walk test: 185 feet, 99% O2 with [place]  GAIT: Distance walked: 200  feet Assistive device utilized: Environmental consultant - 2 wheeled Level of assistance: Modified independence Comments: Pt requires therapist assist to carry supplemental O2 supply, decreased gait speed noted, decreased visual acuity noted, and slight antalgic pattern noted with decreased stance time on RLE although minimal.                                                                                                                                 TREATMENT DATE:  08/19/24: Nustep seat 8 x 5' Standing: - Heel raises incline slope 20x   - Toe raises decline slope 20x  - STS too difficult standard height, added foam and cueing for foot position and nose over toes with ability to complete 2x 10   - Squat with multimodal cueing for mechanics, heavy UE support  - Abduction with 2# BLE 15x  - Extension 2# 15x  - Reciprocal pattern 4in 2RT 1 HR  - Tandem stance 2x 30  - SLS 2-3 max of 5  Reports of dizziness, sat, water   Vitals: 169/84 mmHg HR 107bpm  08/14/24: Nustep seat 8 x 5' Standing by // bars:  - Heel raises incline slope 20x   - Toe raises decline slope 20x  - STS too difficult standard height, added foam and cueing for foot position and nose over toes with ability to  complete 2x 10  -TKE RTB 15x5  - 6in lateral step up 2x 10  - 6in forward up and over10x   - Leg press 3Pl 2x 10  Supine: AROM 1-134 degrees   08/12/2024  Therapeutic Exercise: -Stationary bike, 4 minutes, pt able to power up bike immediately, pt cued for increased pace, level 4 resistance -Hamstring curls, 3 sets of 10 reps, plate 3>4, pt cued for increased eccentric control and decreased knee valgus -Leg press, 2 sets 7 reps, plate 3, pt cued for increased eccentric control -Lateral stepping with RTB at knees, pt cued for increased step length and increased eccentric control, 3 laps 20 steps per lap -Heel/toe raises, 1 set of 20 reps, pt cued for increased ROM Neuromuscular Re-education: -Aeromat walks,  lateral/tandem stepping, 2 laps each variation in parallel bars, pt cued for proper form and LE placement -Fwd lunges on bosu ball, pt cued for one UE support, 1 set of 7 reps bilaterally Therapeutic Activity: -Step up and overs, 8 inch step, 1 set of 5 reps, pt cued for increased step length -Lateral step up and overs, 8 inch step, 1 set of 5 reps, pt cued for increased foot clearance  07/31/2024  Therapeutic Exercise: -Stationary bike, 3 minutes, pt able to power up bike after a minute and a half -Hamstring curls, 2 sets of 10 reps, plate 1, blocker on setting 3 to decreased strain on knee extension -Leg press, 2 sets 10 reps, plate 2, pt cued for increased eccentric control Neuromuscular Re-education: -Single leg stance, 2 trials bilaterally, L able to hold for 3 seconds, R able to hold less than 1 second -TKE on 4 inch step, 2 sets of 7 reps RLE only, pt cued for LLE sequencing Therapeutic Activity: -Step up and overs, 6 inch step, 1 set of 7 reps, pt cued for decreased speed for decreased dizziness   07/29/24: Vitals: O2 sat 99-100% HR 88 room air Reviewed goals Educated importance of HEP compliance  MMT see above  Supine: with wedge and 2 pillows under bed  Quad sets 10x  SAQ on half bolster  Bridge 10x5 AROM 3-130 degrees Standing:  SLS unable   Heel raises 10x  Toe raises 10x Vitals:  97%, HR 85 bpm   PATIENT EDUCATION:  Education details: Pt was educated on findings of PT evaluation, prognosis, frequency of therapy visits and rationale, attendance policy, and HEP if given.   Person educated: Patient and Spouse Education method: Explanation, Verbal cues, and Handouts Education comprehension: verbalized understanding, verbal cues required, and needs further education  HOME EXERCISE PROGRAM: Access Code: [email or web address] URL: [email or web address] Date: 07/17/2024 Prepared by: Lang Ada  Exercises - Seated Knee Extension AROM  - 1 x daily - 7 x weekly -  3 sets - 10 reps - Sit to Stand with Armchair  - 1 x daily - 7 x weekly - 3 sets - 10 reps - Heel Raises with Counter Support  - 1 x daily - 7 x weekly - 3 sets - 10 reps  07/29/24: Bridge 10x 5  ASSESSMENT:  CLINICAL IMPRESSION: 08/19/24:  Session focus with functional strengthening and balance training.  Added gluteal strengthening exercises this session.  Pt with multimodal cueing to improve squat mechanics, presents with heavy UE support this session.  Added 2# with hip strengthening exercises.  Pt able to demonstrate reciprocal pattern with 4in stairs with 1 HR assistance and good mechanics.  Pt most limited with balance activities this session, required cueing for posture and  intermittent HHA.  EOS pt limited by fatigue, no reports of pain through sessoin.  Pt with reports of dizziness through session, vitals taken and BP was high.  Reviewed current medication and importance of staying hydrated and eating regularly, admits she has not been looking after herself like she should during these stressful times.  Eval:  Patient is a 78 y.o. female who was seen today for physical therapy evaluation and treatment for s/p rt total knee replacement. Patient demonstrates decreased RLE strength, abnormal pain rating, decreased endurance, and impaired balance. Pt reports having symptoms of UTI and drop in O2 saturation into the 30s during home health PT session and has been using spouses old supplemental O2 tank for community ambulation. O2 levels stable throughout session today with supplemental O2, pt states she has appontment with primary care next Tuesday to address abnormal O2 levels. Patient also demonstrates difficulty with ambulation during today's session with need for therapist assist for supplemental O2 tank carrry and RW for balance, decreased stride length and velocity noted. Patient also demonstrates 121 degrees of knee flexion with no signs of pain and 3 degrees from neutral extension. Pt  demonstrates tenderness to palpation to knee cap although mobility WFL, and posterior knee. Pt unable to stand without UE support for assist this date. Patient requires education on importance of HEP compliance, DVT, prognosis and role of PT. Patient would benefit from skilled physical therapy for decreased R knee pain, increased endurance with ambulation, increased RLE strength, and balance for improved gait quality, return to higher level of function with ADLs, and progress towards therapy goals.  OBJECTIVE IMPAIRMENTS: Abnormal gait, decreased activity tolerance, decreased balance, decreased endurance, decreased knowledge of use of DME, decreased mobility, difficulty walking, decreased ROM, decreased strength, impaired sensation, and pain.   ACTIVITY LIMITATIONS: carrying, lifting, bending, standing, squatting, sleeping, stairs, transfers, and bed mobility  PARTICIPATION LIMITATIONS: meal prep, laundry, interpersonal relationship, shopping, community activity, and yard work  PERSONAL FACTORS: Age, Fitness, Past/current experiences, and Time since onset of injury/illness/exacerbation are also affecting patient's functional outcome.   REHAB POTENTIAL: Good  CLINICAL DECISION MAKING: Stable/uncomplicated  EVALUATION COMPLEXITY: Low   GOALS: Goals reviewed with patient? No  SHORT TERM GOALS: Target date: 08/07/24  Patient will demonstrate evidence of independence with individualized HEP and will report compliance for at least 3 days per week for optimized progression towards remaining therapy goals. Baseline:  Goal status: INITIAL  2.  Patient will report a decrease in pain level during community ambulation by at least 2 points for improved quality of life. Baseline: 3/10 Goal status: INITIAL     LONG TERM GOALS: Target date: 08/28/24  Pt will demonstrate a an increase of at least 9 points on the LEFS for improved performance of community ambulation and ADL. Baseline: see  objective Goal status: INITIAL  2.  Pt will improve 2 MWT by 50 feet in order to demonstrate improved functional ambulatory capacity in community setting.  Baseline: see objective Goal status: INITIAL  3.  Pt will demonstrate WFL ROM (flexion and extension) in right knee, for increased mobility and maximal efficiency of gait cycle during ambulation. Baseline: see objective Goal status: INITIAL  4.  Pt will demonstrate at least 4-/5 MMT for right lower extremity for increased strength during ADL and community ambulation. Baseline: see objective Goal status: INITIAL  5.  Pt will improve 5TSTS by at least 2.3 seconds in order to improve strength during functional activities.. Baseline: see objective Goal status: INITIAL  6. Pt  will demonstrate the ability to score at least a 22/30 on FGA to demonstrate decreased fall risk during ambulation and functional mobility. Baseline: see objective Goal status: Added 07/30/24  PLAN:  PT FREQUENCY: 1-2x/week  PT DURATION: 6 weeks  PLANNED INTERVENTIONS: 97110-Therapeutic exercises, 97530- Therapeutic activity, 97112- Neuromuscular re-education, 97535- Self Care, [id number]- Manual therapy, [phone]- Gait training, Patient/Family education, Balance training, Stair training, Joint mobilization, DME instructions, Cryotherapy, and Moist heat  PLAN FOR NEXT SESSION: Improve knee extension range and progress RLE strengthening, progress gait training with LRAD.     Augustin Mclean, LPTA/CLT; CBIS [phone]  1:51 PM, 08/19/24

## 2024-08-20 ENCOUNTER — Encounter (HOSPITAL_COMMUNITY): Payer: Self-pay | Admitting: Oncology

## 2024-08-20 ENCOUNTER — Encounter

## 2024-08-21 ENCOUNTER — Encounter (HOSPITAL_COMMUNITY): Payer: Self-pay

## 2024-08-21 ENCOUNTER — Encounter (HOSPITAL_COMMUNITY): Payer: Self-pay | Admitting: Oncology

## 2024-08-21 ENCOUNTER — Ambulatory Visit (HOSPITAL_COMMUNITY): Attending: Orthopedic Surgery

## 2024-08-21 DIAGNOSIS — Z7409 Other reduced mobility: Secondary | ICD-10-CM | POA: Diagnosis present

## 2024-08-21 DIAGNOSIS — M25561 Pain in right knee: Secondary | ICD-10-CM | POA: Insufficient documentation

## 2024-08-21 DIAGNOSIS — H548 Legal blindness, as defined in USA: Secondary | ICD-10-CM | POA: Insufficient documentation

## 2024-08-21 DIAGNOSIS — R41842 Visuospatial deficit: Secondary | ICD-10-CM | POA: Insufficient documentation

## 2024-08-21 NOTE — Therapy (Addendum)
 OUTPATIENT PHYSICAL THERAPY LOWER EXTREMITY TREATMENT  Progress Note Reporting Period 07/17/24 to 08/21/24  See note below for Objective Data and Assessment of Progress/Goals.     Patient Name: Katherine Larson MRN: [id number] DOB:1946/05/06, 78 y.o., female Today's Date: 08/21/2024  END OF SESSION:  PT End of Session - 08/21/24 1115     Visit Number 7    Number of Visits 12    Date for Recertification  08/28/24    Authorization Type [employer] MEDICARE    Authorization Time Period uhc covered 10 visits from 07/17/24-08/21/2024    Authorization - Visit Number 6    Authorization - Number of Visits 10    Progress Note Due on Visit 10    PT Start Time 1115    PT Stop Time 1153    PT Time Calculation (min) 38 min    Equipment Utilized During Treatment Gait belt    Activity Tolerance Patient tolerated treatment well    Behavior During Therapy WFL for tasks assessed/performed            Past Medical History:  Diagnosis Date   AICD (automatic cardioverter/defibrillator) present    Anemia    anemia of chronic disease +/- IDA followed by Dr. Gatha. received iron  infusions in the past   Borderline glaucoma    CAD (coronary artery disease)    a. s/p CABG 1996. b. low risk nuc 2011. c. LHC 04/2016 due to drop in EF -> occluded native LAD,  widely patent sequential LIMA-D1-LAD.   Chronic kidney disease    mild stage of kidney disease   Chronic systolic CHF (congestive heart failure) (HCC)    Complication of anesthesia    DM2 (diabetes mellitus, type 2) (HCC)    not on any medicine for this at this time, 05/2016   Dyspnea    with exertion   GERD (gastroesophageal reflux disease)    Gout    History of blood transfusion    History of hiatal hernia    in 20s   History of pneumonia    March, 2016   HLD (hyperlipidemia)    HTN (hypertension)    Inappropriate sinus tachycardia    LBBB (left bundle branch block)    a. Seen in 04/2016   Lymphocytic colitis 04/2020   Macular  degeneration    left   Myocardial infarction [facility])    1996   Neuropathy    Neuropathy    NICM (nonischemic cardiomyopathy) (HCC)    a. Dx 04/2016 - EF 25-30%, diffuse HK, elevated LVEDP, mild MR, mod LAE.   Normocytic anemia 10/17/2021   NSVT (nonsustained ventricular tachycardia) (HCC)    PAT (paroxysmal atrial tachycardia)    Pneumonia    PONV (postoperative nausea and vomiting)    after just about every surgery I've had   Rheumatoid arthritis (HCC)    RA- hands   Seasonal allergies    Past Surgical History:  Procedure Laterality Date   ABDOMINAL HYSTERECTOMY     APPENDECTOMY     BACK SURGERY     BIOPSY  04/27/2020   Procedure: BIOPSY;  Surgeon: Shaaron Lamar HERO, MD;  Location: [facility];  Service: Endoscopy;;  ascending colon biopsy   BIV UPGRADE N/A 05/14/2024   Procedure: BIV UPGRADE;  Surgeon: Waddell Danelle ORN, MD;  Location: [facility] INVASIVE CV LAB;  Service: Cardiovascular;  Laterality: N/A;   CARDIAC CATHETERIZATION N/A 05/01/2016   Procedure: Right/Left Heart Cath and Coronary/Graft Angiography;  Surgeon: Alm ORN Clay, MD;  Location: [facility] INVASIVE CV LAB;  Service: Cardiovascular;  Laterality: N/A;   CHOLECYSTECTOMY     COLONOSCOPY  11/2011   Dr. Magod:ext/int hemorrhoids, diverticulosis sigmoid colon and distal desc colon, mid-desc colon hyperplastic polyps.    COLONOSCOPY WITH PROPOFOL  N/A 04/27/2020   Rourk: Diverticulosis, hemorrhoids, normal terminal ileum.  Random colon biopsies significant for chronic lymphocytic colitis. No repeat due to age.   CORONARY ARTERY BYPASS GRAFT  1996   2 vessels   ESOPHAGOGASTRODUODENOSCOPY  11/2011   Dr. Rosalie: small hiatal hernia, one non-bleeding superficial gastric ulcer, medium-sized diverticulum in area of papilla   ESOPHAGOGASTRODUODENOSCOPY N/A 12/29/2015   Dr. Shaaron: Chronic inactive gastritis, normal esophagus status post dilation, duodenal diverticula   ESOPHAGOGASTRODUODENOSCOPY (EGD) WITH PROPOFOL  N/A 07/24/2019   Dr.  Shaaron: Normal esophagus status post empiric dilation, small hiatal hernia, large D2 diverticulum   ESOPHAGOGASTRODUODENOSCOPY (EGD) WITH PROPOFOL  N/A 06/03/2021   Surgeon: Shaaron Lamar HERO, MD; normal esophagus, normal stomach, normal duodenum.  Abnormal hypopharyngeal mucosa bilaterally just distal to the base of the tongue (right side greater than left), overlying mucosa appeared irregular and somewhat verrucous in appearance.  Recommended ENT evaluation.   EYE SURGERY Bilateral    cataract removal   FOOT SURGERY     removal bone spur   FRACTURE SURGERY Right    arm   GIVENS CAPSULE STUDY  11/2012   Dr. Rosalie: minimal gastritis, normal small bowel capsule   HERNIA REPAIR     hiatal hernia   ICD IMPLANT N/A 02/04/2021   Procedure: ICD IMPLANT;  Surgeon: Waddell Danelle ORN, MD;  [facility] INVASIVE CV LAB;  Service: Cardiovascular;  Laterality: N/A;   MALONEY DILATION N/A 07/24/2019   Procedure: AGAPITO DILATION;  Surgeon: Shaaron Lamar HERO, MD;  [facility]: [facility];  Service: Endoscopy;  Laterality: N/A;   MALONEY DILATION N/A 06/03/2021   Procedure: AGAPITO DILATION;  Surgeon: Shaaron Lamar HERO, MD;  Location: [facility];  Service: Endoscopy;  Laterality: N/A;   MAXIMUM ACCESS (MAS)POSTERIOR LUMBAR INTERBODY FUSION (PLIF) 1 LEVEL N/A 08/14/2013   Procedure:  MAXIMUM ACCESS SURGERY(MAS) POSTERIOR LUMBAR INTERBODY FUSION LUMBAR THREE-FOUR ;  Surgeon: Alm GORMAN Molt, MD;  Location: [facility];  Service: Neurosurgery;  Laterality: N/A;   MAXIMUM ACCESS SURGERY(MAS) POSTERIOR LUMBAR INTERBODY FUSION LUMBAR THREE-FOUR    PTCA     RIGHT/LEFT HEART CATH AND CORONARY ANGIOGRAPHY N/A 03/26/2020   Procedure: RIGHT/LEFT HEART CATH AND CORONARY ANGIOGRAPHY;  Surgeon: Anner Alm ORN, MD;  Location: [facility] INVASIVE CV LAB;  Service: Cardiovascular;  Laterality: N/A;   SPINAL CORD STIMULATOR BATTERY EXCHANGE N/A 01/19/2021   Procedure: Spinal cord stimulator battery change;  Surgeon: Darlis Deatrice GORMAN, MD;   Location: [facility] OR;  Service: Neurosurgery;  Laterality: N/A;   SPINAL CORD STIMULATOR INSERTION N/A 06/16/2016   Procedure: LUMBAR SPINAL CORD STIMULATOR INSERTION;  Surgeon: Deward Fabian, MD;  Location: [facility];  Service: Neurosurgery;  Laterality: N/A;  LUMBAR SPINAL CORD STIMULATOR INSERTION   TEE WITHOUT CARDIOVERSION N/A 02/01/2023   Procedure: TRANSESOPHAGEAL ECHOCARDIOGRAM (TEE);  Surgeon: Debera Jayson MATSU, MD;  Location: [facility] OR[facility];  Service: Cardiovascular;  Laterality: N/A;   tens  05/2016   TONSILLECTOMY     TOTAL KNEE ARTHROPLASTY Right 06/30/2024   Procedure: ARTHROPLASTY, KNEE, TOTAL;  Surgeon: Yvone Rush, MD;  Location: [facility] ORS;  Service: Orthopedics;  Laterality: Right;  RIGHT TOTAL KNEE ARTHROPLASTY   Patient Active Problem List   Diagnosis Date Noted   Arthritis of right knee 06/30/2024  Primary osteoarthritis of right knee 06/27/2024   Bacteremia 02/01/2023   Pseudomonas sepsis (HCC) 01/30/2023   Acute respiratory failure with hypoxia (HCC) 01/28/2023   Multifocal pneumonia 01/28/2023   Melena 11/28/2022   Rectal bleeding 11/28/2022   Upper abdominal pain 11/28/2022   Early satiety 07/27/2022   Nausea without vomiting 07/27/2022   Hypotension 02/10/2022   Normocytic anemia 10/17/2021   Lymphocytic colitis 07/02/2020   Coronary artery disease, occlusive: CTO proxLAD 03/26/2020   Abnormal nuclear stress test 03/26/2020   Chronic diarrhea 03/02/2020   NICM- new drop in EF 25-30% 05/03/2016   Acute pulmonary edema (HCC)    LBBB (left bundle branch block) 04/29/2016   Chronic HFrEF (heart failure with reduced ejection fraction) (HCC) 04/29/2016   Abdominal pain, chronic, epigastric 03/07/2016   Septic shock (HCC)    COPD exacerbation (HCC) 01/21/2016   Hyperthyroidism 01/21/2016   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   Hypokalemia 01/19/2016   Hyponatremia 01/19/2016   Diet-controlled diabetes mellitus (HCC) 01/19/2016   CAP (community acquired pneumonia) 01/18/2016    Mucosal abnormality of stomach    Dysphagia    Constipation 12/08/2015   Gastroesophageal reflux disease 12/08/2015   Abnormal CT scan, esophagus 12/08/2015   Esophageal dysphagia 12/08/2015   Abdominal pain, epigastric 12/08/2015   Loss of weight 12/08/2015   Lower abdominal pain 12/08/2015   Back pain 08/26/2015   Headache 08/26/2015   Essential hypertension 08/26/2015   DM type 2 (diabetes mellitus, type 2) (HCC) 08/26/2015   Sinus tachycardia 06/07/2015   Diarrhea 06/07/2015   Preop cardiovascular exam 07/16/2013   Atrial flutter (HCC) 07/16/2013   Anemia 01/08/2013   Leukocytosis 12/17/2012   DYSPNEA 02/02/2010   DIABETES MELLITUS 07/28/2009   Hyperlipidemia 07/28/2009   CHOLECYSTECTOMY, HX OF 07/28/2009   Hx of CABG '96- cath 05/01/16 07/28/2009   HERNIORRHAPHY, HX OF 07/28/2009    PCP: Teresa Channel, MD   REFERRING PROVIDER: Yvone Rush, MD  REFERRING DIAG: s/p rt total knee replacement  THERAPY DIAG:  Right knee pain, unspecified chronicity  Impaired functional mobility, balance, gait, and endurance  Rationale for Evaluation and Treatment: Rehabilitation  ONSET DATE: 06/30/24 RTKA  SUBJECTIVE:   SUBJECTIVE STATEMENT: Pt presents with son today. Pt states knee is doing well but she is not, she poked herself in the eye this morning while washing her face. Pt states husband will have been in the hospital for 3 weeks tomorrow.   Eval:  Pt states she had the surgery the 11th. Pt states she had home health PT about 6 times and had some issues with the oxygen . Previously she jut wore oxygen  at night. Pt has many ongoing health issues and has been struggling with symptoms of UTI and diarrhea the past few days.   PERTINENT HISTORY: -Been using husbands supplemental O2 since home therapy, had just been using it at night before the surgery PAIN:  Are you having pain? Yes: NPRS scale: 3/10 Pain location: right knee Pain description: ache Aggravating factors:  being up on it Relieving factors: rest elevated  PRECAUTIONS: Fall  RED FLAGS: UTI symptoms during urination   WEIGHT BEARING RESTRICTIONS: WBAT  FALLS:  Has patient fallen in last 6 months? No  LIVING ENVIRONMENT: Lives with: lives with their spouse Lives in: House/apartment Stairs: No Has following equipment at home: Environmental consultant - 2 wheeled  OCCUPATION: retired, Biomedical engineer  PLOF: Requires assistive device for independence, Needs assistance with ADLs, and Needs assistance with gait  PATIENT GOALS: walk faster, decrease the knee pain,  improve balance, increase LE strength  NEXT MD VISIT: Next Tuesday for Oxygen    OBJECTIVE:  Note: Objective measures were completed at Evaluation unless otherwise noted.  DIAGNOSTIC FINDINGS: none  PATIENT SURVEYS:  LEFS : 35/80  COGNITION: Overall cognitive status: Within functional limits for tasks assessed     SENSATION: Light touch: Impaired numbness around incision  EDEMA:  RLE knee and ankle  PALPATION: Tenderness to palpation at posterior knee and incision area.  LOWER EXTREMITY ROM:  Active ROM Right eval Left eval Right 07/29/24 Right 08/21/24 Left  Hip flexion       Hip extension       Hip abduction       Hip adduction       Hip internal rotation       Hip external rotation       Knee flexion 121  130 WFL   Knee extension 3 from neutral  3 8 from neutral 4 from neutral  Ankle dorsiflexion       Ankle plantarflexion       Ankle inversion       Ankle eversion        (Blank rows = not tested)  LOWER EXTREMITY MMT:  MMT Right 07/29/24 Left 07/29/24 Right 08/21/24 Left 08/21/24  Hip flexion 3+ 3/5 3+ 4-  Hip extension Cannot tolerate prone, complete sidelying next session. Cannot tolerate prone, complete sidelying next session. 4-, seated  4+, seated  Hip abduction 3/5 3/5 4- 4-  Hip adduction   3+ 3+  Hip internal rotation      Hip external rotation      Knee flexion   4+ 4+  Knee extension   3+, slight  pain 4+  Ankle dorsiflexion      Ankle plantarflexion      Ankle inversion      Ankle eversion       (Blank rows = not tested)    FUNCTIONAL TESTS:  5 times sit to stand: 16.48 seconds, 94% O2, with [place]ia 2 minute walk test: 185 feet, 99% O2 with [place] 08/21/24: 5TSTS, 12.27 seconds, BUE support on RA , 271 feet FGA to be completed next session GAIT: Distance walked: 200 feet Assistive device utilized: Environmental consultant - 2 wheeled Level of assistance: Modified independence Comments: Pt requires therapist assist to carry supplemental O2 supply, decreased gait speed noted, decreased visual acuity noted, and slight antalgic pattern noted with decreased stance time on RLE although minimal.                                                                                                                                 TREATMENT DATE:  08/21/2024  Progress note: pt educated on goal progression, importance of HEP compliance, and POC. Therapeutic Exercise: -Stationary bike, 4 minutes, pt able to power up bike immediately, pt cued for increased pace, level 4 resistance -Heel raises, 1 set of 15 reps, pt cued for decreased  UE support and increased eccentric control Neuromuscular Re-education: -Fwd lunges on bosu ball, pt cued for one UE support, 2 set of 5 reps bilaterally Therapeutic Activity: -Step up and overs, 8 inch step, 1 set of 5 reps bilaterally, pt cued for increased time with turn -Lateral step up and overs, 8 inch step, 1 set of 5 reps bilaterally, pt cued for increased step length  08/19/24: Nustep seat 8 x 5' Standing: - Heel raises incline slope 20x   - Toe raises decline slope 20x  - STS too difficult standard height, added foam and cueing for foot position and nose over toes with ability to complete 2x 10   - Squat with multimodal cueing for mechanics, heavy UE support  - Abduction with 2# BLE 15x  - Extension 2# 15x  - Reciprocal pattern 4in 2RT 1 HR  - Tandem stance 2x 30  -  SLS 2-3 max of 5  Reports of dizziness, sat, water   Vitals: 169/84 mmHg HR 107bpm  08/14/24: Nustep seat 8 x 5' Standing by // bars:  - Heel raises incline slope 20x   - Toe raises decline slope 20x  - STS too difficult standard height, added foam and cueing for foot position and nose over toes with ability to complete 2x 10  -TKE RTB 15x5  - 6in lateral step up 2x 10  - 6in forward up and over10x   - Leg press 3Pl 2x 10  Supine: AROM 1-134 degrees    PATIENT EDUCATION:  Education details: Pt was educated on findings of PT evaluation, prognosis, frequency of therapy visits and rationale, attendance policy, and HEP if given.   Person educated: Patient and Spouse Education method: Explanation, Verbal cues, and Handouts Education comprehension: verbalized understanding, verbal cues required, and needs further education  HOME EXERCISE PROGRAM: Access Code: [email or web address] URL: [email or web address] Date: 07/17/2024 Prepared by: Lang Ada  Exercises - Seated Knee Extension AROM  - 1 x daily - 7 x weekly - 3 sets - 10 reps - Sit to Stand with Armchair  - 1 x daily - 7 x weekly - 3 sets - 10 reps - Heel Raises with Counter Support  - 1 x daily - 7 x weekly - 3 sets - 10 reps  07/29/24: Bridge 10x 5  ASSESSMENT:  CLINICAL IMPRESSION: Patient continues to demonstrate decreased RLE strength/ROM, although improved, decreased gait quality and balance. Patient also demonstrates improved endurance with aerobic based exercise during today's session and on . Pt demonstrates ability to meet 3/7 goals to this point in therapy episode. Patient able to progress dynamic balance and core activation exercises today with lunge and step up variation, good performance with verbal cueing. Patient would continue to benefit from additional 3 weeks of skilled physical therapy for increased endurance with ambulation, increased RLE strength, and improved balance for improved quality of  life, improved independence with gait training and continued progress towards therapy goals.   Eval:  Patient is a 78 y.o. female who was seen today for physical therapy evaluation and treatment for s/p rt total knee replacement. Patient demonstrates decreased RLE strength, abnormal pain rating, decreased endurance, and impaired balance. Pt reports having symptoms of UTI and drop in O2 saturation into the 30s during home health PT session and has been using spouses old supplemental O2 tank for community ambulation. O2 levels stable throughout session today with supplemental O2, pt states she has appontment with primary care next Tuesday to address abnormal O2 levels. Patient also demonstrates difficulty with  ambulation during today's session with need for therapist assist for supplemental O2 tank carrry and RW for balance, decreased stride length and velocity noted. Patient also demonstrates 121 degrees of knee flexion with no signs of pain and 3 degrees from neutral extension. Pt demonstrates tenderness to palpation to knee cap although mobility WFL, and posterior knee. Pt unable to stand without UE support for assist this date. Patient requires education on importance of HEP compliance, DVT, prognosis and role of PT. Patient would benefit from skilled physical therapy for decreased R knee pain, increased endurance with ambulation, increased RLE strength, and balance for improved gait quality, return to higher level of function with ADLs, and progress towards therapy goals.  OBJECTIVE IMPAIRMENTS: Abnormal gait, decreased activity tolerance, decreased balance, decreased endurance, decreased knowledge of use of DME, decreased mobility, difficulty walking, decreased ROM, decreased strength, impaired sensation, and pain.   ACTIVITY LIMITATIONS: carrying, lifting, bending, standing, squatting, sleeping, stairs, transfers, and bed mobility  PARTICIPATION LIMITATIONS: meal prep, laundry, interpersonal  relationship, shopping, community activity, and yard work  PERSONAL FACTORS: Age, Fitness, Past/current experiences, and Time since onset of injury/illness/exacerbation are also affecting patient's functional outcome.   REHAB POTENTIAL: Good  CLINICAL DECISION MAKING: Stable/uncomplicated  EVALUATION COMPLEXITY: Low   GOALS: Goals reviewed with patient? Yes  SHORT TERM GOALS: Target date: 08/07/24  Patient will demonstrate evidence of independence with individualized HEP and will report compliance for at least 3 days per week for optimized progression towards remaining therapy goals. Baseline: 08/21/24, pt states she's been too busy with caring for husband Goal status: IN PROGRESS  2.  Patient will report a decrease in pain level during community ambulation by at least 2 points for improved quality of life. Baseline: 3/10 Goal status: MET     LONG TERM GOALS: Target date: 08/28/24  Pt will demonstrate a an increase of at least 9 points on the LEFS for improved performance of community ambulation and ADL. Baseline: see objective Goal status: INITIAL  2.  Pt will improve 2 MWT by 50 feet in order to demonstrate improved functional ambulatory capacity in community setting.  Baseline: see objective Goal status: MET  3.  Pt will demonstrate WFL ROM (flexion and extension) in right knee, for increased mobility and maximal efficiency of gait cycle during ambulation. Baseline: see objective Goal status: IN PROGRESS  4.  Pt will demonstrate at least 4-/5 MMT for right lower extremity for increased strength during ADL and community ambulation. Baseline: see objective Goal status: IN PROGRESS  5.  Pt will improve 5TSTS by at least 2.3 seconds in order to improve strength during functional activities.. Baseline: see objective Goal status: MET  6. Pt will demonstrate the ability to score at least a 22/30 on FGA to demonstrate decreased fall risk during ambulation and functional  mobility. Baseline: see objective Goal status: Added 07/30/24  PLAN:  PT FREQUENCY: 1-2x/week  PT DURATION: 3 weeks  PLANNED INTERVENTIONS: 97110-Therapeutic exercises, 97530- Therapeutic activity, 97112- Neuromuscular re-education, 97535- Self Care, [id number]- Manual therapy, [phone]- Gait training, Patient/Family education, Balance training, Stair training, Joint mobilization, DME instructions, Cryotherapy, and Moist heat  PLAN FOR NEXT SESSION: Update LEFS, FGA for goal progression, improve knee extension range and progress RLE strengthening, progress gait training with LRAD (pt vision would probably not allow SPC usage at home).  Plan to extend 3 weeks at 2 visits per week for increased progress towards goals unmet.    Tanda Morrissey, PT, DPT [facility] Office: [phone] 12:05  PM, 08/21/24  [employer] Medicare Auth Request Information Treatment Start Date: 07/17/24   Date of referral: 06/18/24 Referring provider: Yvone Rush, MD Referring diagnosis (ICD 10)? s/p rt total knee replacement Treatment diagnosis (ICD 10)? (if different than referring diagnosis) M25.561; Z74.09   What was this (referring dx) caused by? Surgery (Type: RTKA)   Nature of Condition: Initial Onset (within last 3 months)             Laterality: Rt  Current Functional Measure Score: LEFS 35/80  Objective measurements identify impairments when they are compared to normal values, the uninvolved extremity, and prior level of function.  [x]  Yes  []  No  Objective assessment of functional ability: Moderate functional limitations   Briefly describe symptoms: right knee pain, decreased endurance, decreased functional mobilty, impaired balance   How did symptoms start: surgery   Average pain intensity:            Last 24 hours: 3/10            Past week: 5/10   How often does the pt experience symptoms? Frequently   How much have the symptoms interfered with usual daily activities?  Moderately   How has condition changed since care began at this facility? NA - initial visit   In general, how is the patients overall health? Fair     BACK PAIN (STarT Back Screening Tool) No

## 2024-08-25 ENCOUNTER — Ambulatory Visit (HOSPITAL_COMMUNITY)
Admission: RE | Admit: 2024-08-25 | Discharge: 2024-08-25 | Disposition: A | Source: Ambulatory Visit | Attending: Cardiology | Admitting: Cardiology

## 2024-08-25 ENCOUNTER — Other Ambulatory Visit: Payer: Self-pay

## 2024-08-25 ENCOUNTER — Encounter: Payer: Self-pay | Admitting: Pharmacist

## 2024-08-25 ENCOUNTER — Other Ambulatory Visit (HOSPITAL_COMMUNITY): Payer: Self-pay

## 2024-08-25 DIAGNOSIS — Z951 Presence of aortocoronary bypass graft: Secondary | ICD-10-CM | POA: Insufficient documentation

## 2024-08-25 DIAGNOSIS — I5022 Chronic systolic (congestive) heart failure: Secondary | ICD-10-CM | POA: Insufficient documentation

## 2024-08-25 DIAGNOSIS — R7881 Bacteremia: Secondary | ICD-10-CM | POA: Diagnosis not present

## 2024-08-25 DIAGNOSIS — R0602 Shortness of breath: Secondary | ICD-10-CM | POA: Insufficient documentation

## 2024-08-25 DIAGNOSIS — R9431 Abnormal electrocardiogram [ECG] [EKG]: Secondary | ICD-10-CM | POA: Insufficient documentation

## 2024-08-25 DIAGNOSIS — E119 Type 2 diabetes mellitus without complications: Secondary | ICD-10-CM | POA: Insufficient documentation

## 2024-08-25 DIAGNOSIS — I447 Left bundle-branch block, unspecified: Secondary | ICD-10-CM | POA: Diagnosis not present

## 2024-08-25 DIAGNOSIS — I429 Cardiomyopathy, unspecified: Secondary | ICD-10-CM | POA: Diagnosis not present

## 2024-08-25 DIAGNOSIS — Z950 Presence of cardiac pacemaker: Secondary | ICD-10-CM | POA: Insufficient documentation

## 2024-08-25 DIAGNOSIS — I4892 Unspecified atrial flutter: Secondary | ICD-10-CM | POA: Insufficient documentation

## 2024-08-25 DIAGNOSIS — I080 Rheumatic disorders of both mitral and aortic valves: Secondary | ICD-10-CM | POA: Insufficient documentation

## 2024-08-25 DIAGNOSIS — R0600 Dyspnea, unspecified: Secondary | ICD-10-CM | POA: Insufficient documentation

## 2024-08-25 DIAGNOSIS — R000 Tachycardia, unspecified: Secondary | ICD-10-CM | POA: Insufficient documentation

## 2024-08-25 DIAGNOSIS — I110 Hypertensive heart disease with heart failure: Secondary | ICD-10-CM | POA: Diagnosis not present

## 2024-08-25 LAB — ECHOCARDIOGRAM COMPLETE
Area-P 1/2: 5.84 cm2
Calc EF: 46.8 %
MV VTI: 2.24 cm2
S' Lateral: 3.8 cm
Single Plane A2C EF: 39.8 %
Single Plane A4C EF: 55 %

## 2024-08-25 MED ORDER — ATORVASTATIN CALCIUM 40 MG PO TABS
40.0000 mg | ORAL_TABLET | Freq: Every day | ORAL | 3 refills | Status: AC
  Filled 2024-08-25: qty 90, 90d supply, fill #0

## 2024-08-25 NOTE — Progress Notes (Signed)
  Echocardiogram 2D Echocardiogram has been performed.  Katherine Larson 08/25/2024, 12:06 PM

## 2024-08-27 ENCOUNTER — Other Ambulatory Visit: Payer: Self-pay

## 2024-08-27 ENCOUNTER — Ambulatory Visit (HOSPITAL_COMMUNITY)

## 2024-08-27 ENCOUNTER — Encounter (HOSPITAL_COMMUNITY): Payer: Self-pay

## 2024-08-27 ENCOUNTER — Encounter (HOSPITAL_COMMUNITY): Payer: Self-pay | Admitting: *Deleted

## 2024-08-27 ENCOUNTER — Emergency Department (HOSPITAL_COMMUNITY)
Admission: EM | Admit: 2024-08-27 | Discharge: 2024-08-27 | Discharge status: Against Medical Advice | Attending: Emergency Medicine | Admitting: Emergency Medicine

## 2024-08-27 DIAGNOSIS — M25561 Pain in right knee: Secondary | ICD-10-CM

## 2024-08-27 DIAGNOSIS — Z5321 Procedure and treatment not carried out due to patient leaving prior to being seen by health care provider: Secondary | ICD-10-CM | POA: Insufficient documentation

## 2024-08-27 DIAGNOSIS — R10A0 Flank pain, unspecified side: Secondary | ICD-10-CM | POA: Insufficient documentation

## 2024-08-27 DIAGNOSIS — Z7409 Other reduced mobility: Secondary | ICD-10-CM

## 2024-08-27 LAB — URINALYSIS, ROUTINE W REFLEX MICROSCOPIC
Bilirubin Urine: NEGATIVE
Glucose, UA: >=500 mg/dL — AB
Hgb urine dipstick: NEGATIVE
Ketones, ur: NEGATIVE mg/dL
Nitrite: NEGATIVE
Protein, ur: NEGATIVE mg/dL
Specific Gravity, Urine: 1.021 (ref 1.005–1.030)
pH: 6 (ref 5.0–8.0)

## 2024-08-27 LAB — CBC
HCT: 35.9 % — ABNORMAL LOW (ref 36.0–46.0)
Hemoglobin: 11.1 g/dL — ABNORMAL LOW (ref 12.0–15.0)
MCH: 32.8 pg (ref 26.0–34.0)
MCHC: 30.9 g/dL (ref 30.0–36.0)
MCV: 106.2 fL — ABNORMAL HIGH (ref 80.0–100.0)
Platelets: 222 K/uL (ref 150–400)
RBC: 3.38 MIL/uL — ABNORMAL LOW (ref 3.87–5.11)
RDW: 14.5 % (ref 11.5–15.5)
WBC: 5.8 K/uL (ref 4.0–10.5)
nRBC: 0 % (ref 0.0–0.2)

## 2024-08-27 LAB — BASIC METABOLIC PANEL WITH GFR
Anion gap: 12 (ref 5–15)
BUN: 19 mg/dL (ref 8–23)
CO2: 24 mmol/L (ref 22–32)
Calcium: 9.4 mg/dL (ref 8.9–10.3)
Chloride: 104 mmol/L (ref 98–111)
Creatinine, Ser: 1 mg/dL (ref 0.44–1.00)
GFR, Estimated: 58 mL/min — ABNORMAL LOW (ref >60)
Glucose, Bld: 109 mg/dL — ABNORMAL HIGH (ref 70–99)
Potassium: 4.7 mmol/L (ref 3.5–5.1)
Sodium: 140 mmol/L (ref 135–145)

## 2024-08-27 NOTE — Therapy (Signed)
 OUTPATIENT PHYSICAL THERAPY LOWER EXTREMITY TREATMENT   Patient Name: Katherine Larson MRN: [id number] DOB:30-Jan-1946, 78 y.o., female Today's Date: 08/27/2024  END OF SESSION:  PT End of Session - 08/27/24 1033     Visit Number 8    Number of Visits 12    Date for Recertification  09/12/24    Authorization Type [employer] MEDICARE    Authorization Time Period uhc covered 10 visits from 07/17/24-08/21/2024    Authorization - Visit Number 7    Authorization - Number of Visits 10    Progress Note Due on Visit 10    PT Start Time 1035    PT Stop Time 1116    PT Time Calculation (min) 41 min    Equipment Utilized During Treatment Gait belt    Activity Tolerance Patient tolerated treatment well    Behavior During Therapy WFL for tasks assessed/performed            Past Medical History:  Diagnosis Date   AICD (automatic cardioverter/defibrillator) present    Anemia    anemia of chronic disease +/- IDA followed by Dr. Gatha. received iron  infusions in the past   Borderline glaucoma    CAD (coronary artery disease)    a. s/p CABG 1996. b. low risk nuc 2011. c. LHC 04/2016 due to drop in EF -> occluded native LAD,  widely patent sequential LIMA-D1-LAD.   Chronic kidney disease    mild stage of kidney disease   Chronic systolic CHF (congestive heart failure) (HCC)    Complication of anesthesia    DM2 (diabetes mellitus, type 2) (HCC)    not on any medicine for this at this time, 05/2016   Dyspnea    with exertion   GERD (gastroesophageal reflux disease)    Gout    History of blood transfusion    History of hiatal hernia    in 20s   History of pneumonia    March, 2016   HLD (hyperlipidemia)    HTN (hypertension)    Inappropriate sinus tachycardia    LBBB (left bundle branch block)    a. 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 Dx 04/2016 - EF 25-30%, diffuse HK, elevated LVEDP, mild MR, mod LAE.   Normocytic anemia 10/17/2021   NSVT (nonsustained ventricular tachycardia) (HCC)    PAT (paroxysmal atrial tachycardia)    Pneumonia    PONV (postoperative nausea and vomiting)    after just about every surgery I've had   Rheumatoid arthritis (HCC)    RA- hands   Seasonal allergies    Past Surgical History:  Procedure Laterality Date   ABDOMINAL HYSTERECTOMY     APPENDECTOMY     BACK SURGERY     BIOPSY  04/27/2020   Procedure: BIOPSY;  Surgeon: Shaaron Lamar HERO, MD;  Location: [facility];  Service: Endoscopy;;  ascending colon biopsy   BIV UPGRADE N/A 05/14/2024   Procedure: BIV UPGRADE;  Surgeon: Waddell Danelle ORN, MD;  Location: [facility] INVASIVE CV LAB;  Service: Cardiovascular;  Laterality: N/A;   CARDIAC CATHETERIZATION N/A 05/01/2016   Procedure: Right/Left Heart Cath and Coronary/Graft Angiography;  Surgeon: Alm ORN Clay, MD;  Location: [facility] INVASIVE CV LAB;  Service: Cardiovascular;  Laterality: N/A;   CHOLECYSTECTOMY     COLONOSCOPY  11/2011  Dr. Magod:ext/int hemorrhoids, diverticulosis sigmoid colon and distal desc colon, mid-desc colon hyperplastic polyps.    COLONOSCOPY WITH PROPOFOL  N/A 04/27/2020   Rourk: Diverticulosis, hemorrhoids, normal terminal ileum.  Random colon biopsies significant for chronic lymphocytic colitis. No repeat due to age.   CORONARY ARTERY BYPASS GRAFT  1996   2 vessels   ESOPHAGOGASTRODUODENOSCOPY  11/2011   Dr. Rosalie: small hiatal hernia, one non-bleeding superficial gastric ulcer, medium-sized diverticulum in area of papilla   ESOPHAGOGASTRODUODENOSCOPY N/A 12/29/2015   Dr. Shaaron: Chronic inactive gastritis, normal esophagus status post dilation, duodenal diverticula   ESOPHAGOGASTRODUODENOSCOPY (EGD) WITH PROPOFOL  N/A 07/24/2019   Dr. Shaaron: Normal esophagus status post empiric dilation, small hiatal hernia, large D2 diverticulum   ESOPHAGOGASTRODUODENOSCOPY  (EGD) WITH PROPOFOL  N/A 06/03/2021   Surgeon: Shaaron Lamar HERO, MD; normal esophagus, normal stomach, normal duodenum.  Abnormal hypopharyngeal mucosa bilaterally just distal to the base of the tongue (right side greater than left), overlying mucosa appeared irregular and somewhat verrucous in appearance.  Recommended ENT evaluation.   EYE SURGERY Bilateral    cataract removal   FOOT SURGERY     removal bone spur   FRACTURE SURGERY Right    arm   GIVENS CAPSULE STUDY  11/2012   Dr. Rosalie: minimal gastritis, normal small bowel capsule   HERNIA REPAIR     hiatal hernia   ICD IMPLANT N/A 02/04/2021   Procedure: ICD IMPLANT;  Surgeon: Waddell Danelle ORN, MD;  Location: [facility] INVASIVE CV LAB;  Service: Cardiovascular;  Laterality: N/A;   MALONEY DILATION N/A 07/24/2019   Procedure: AGAPITO DILATION;  Surgeon: Shaaron Lamar HERO, MD;  Location: [facility];  Service: Endoscopy;  Laterality: N/A;   MALONEY DILATION N/A 06/03/2021   Procedure: AGAPITO DILATION;  Surgeon: Shaaron Lamar HERO, MD;  Location: [facility];  Service: Endoscopy;  Laterality: N/A;   MAXIMUM ACCESS (MAS)POSTERIOR LUMBAR INTERBODY FUSION (PLIF) 1 LEVEL N/A 08/14/2013   Procedure:  MAXIMUM ACCESS SURGERY(MAS) POSTERIOR LUMBAR INTERBODY FUSION LUMBAR THREE-FOUR ;  Surgeon: Alm GORMAN Molt, MD;  Location: [facility]O [facility];  Service: Neurosurgery;  Laterality: N/A;   MAXIMUM ACCESS SURGERY(MAS) POSTERIOR LUMBAR INTERBODY FUSION LUMBAR THREE-FOUR    PTCA     RIGHT/LEFT HEART CATH AND CORONARY ANGIOGRAPHY N/A 03/26/2020   Procedure: RIGHT/LEFT HEART CATH AND CORONARY ANGIOGRAPHY;  Surgeon: Anner Alm ORN, MD;  Location: [facility] INVASIVE CV LAB;  Service: Cardiovascular;  Laterality: N/A;   SPINAL CORD STIMULATOR BATTERY EXCHANGE N/A 01/19/2021   Procedure: Spinal cord stimulator battery change;  Surgeon: Darlis Deatrice GORMAN, MD;  Location: [facility] OR;  Service: Neurosurgery;  Laterality: N/A;   SPINAL CORD STIMULATOR INSERTION N/A 06/16/2016   Procedure:  LUMBAR SPINAL CORD STIMULATOR INSERTION;  Surgeon: Deward Fabian, MD;  Location: [facility]UR[facility];  Service: Neurosurgery;  Laterality: N/A;  LUMBAR SPINAL CORD STIMULATOR INSERTION   TEE WITHOUT CARDIOVERSION N/A 02/01/2023   Procedure: TRANSESOPHAGEAL ECHOCARDIOGRAM (TEE);  Surgeon: Debera Jayson MATSU, MD;  Location: [facility] ORS;  Service: Cardiovascular;  Laterality: N/A;   tens  05/2016   TONSILLECTOMY     TOTAL KNEE ARTHROPLASTY Right 06/30/2024   Procedure: ARTHROPLASTY, KNEE, TOTAL;  Surgeon: Yvone Rush, MD;  Location: [facility] ORS;  Service: Orthopedics;  Laterality: Right;  RIGHT TOTAL KNEE ARTHROPLASTY   Patient Active Problem List   Diagnosis Date Noted   Arthritis of right knee 06/30/2024   Primary osteoarthritis of right knee 06/27/2024   Bacteremia 02/01/2023   Pseudomonas sepsis (HCC) 01/30/2023   Acute respiratory failure  with hypoxia (HCC) 01/28/2023   Multifocal pneumonia 01/28/2023   Melena 11/28/2022   Rectal bleeding 11/28/2022   Upper abdominal pain 11/28/2022   Early satiety 07/27/2022   Nausea without vomiting 07/27/2022   Hypotension 02/10/2022   Normocytic anemia 10/17/2021   Lymphocytic colitis 07/02/2020   Coronary artery disease, occlusive: CTO proxLAD 03/26/2020   Abnormal nuclear stress test 03/26/2020   Chronic diarrhea 03/02/2020   NICM- new drop in EF 25-30% 05/03/2016   Acute pulmonary edema (HCC)    LBBB (left bundle branch block) 04/29/2016   Chronic HFrEF (heart failure with reduced ejection fraction) (HCC) 04/29/2016   Abdominal pain, chronic, epigastric 03/07/2016   Septic shock (HCC)    COPD exacerbation (HCC) 01/21/2016   Hyperthyroidism 01/21/2016   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   Hypokalemia 01/19/2016   Hyponatremia 01/19/2016   Diet-controlled diabetes mellitus (HCC) 01/19/2016   CAP (community acquired pneumonia) 01/18/2016   Mucosal abnormality of stomach    Dysphagia    Constipation 12/08/2015   Gastroesophageal reflux disease 12/08/2015    Abnormal CT scan, esophagus 12/08/2015   Esophageal dysphagia 12/08/2015   Abdominal pain, epigastric 12/08/2015   Loss of weight 12/08/2015   Lower abdominal pain 12/08/2015   Back pain 08/26/2015   Headache 08/26/2015   Essential hypertension 08/26/2015   DM type 2 (diabetes mellitus, type 2) (HCC) 08/26/2015   Sinus tachycardia 06/07/2015   Diarrhea 06/07/2015   Preop cardiovascular exam 07/16/2013   Atrial flutter (HCC) 07/16/2013   Anemia 01/08/2013   Leukocytosis 12/17/2012   DYSPNEA 02/02/2010   DIABETES MELLITUS 07/28/2009   Hyperlipidemia 07/28/2009   CHOLECYSTECTOMY, HX OF 07/28/2009   Hx of CABG '96- cath 05/01/16 07/28/2009   HERNIORRHAPHY, HX OF 07/28/2009    PCP: Teresa Channel, MD   REFERRING PROVIDER: Yvone Rush, MD  REFERRING DIAG: s/p rt total knee replacement  THERAPY DIAG:  Right knee pain, unspecified chronicity  Impaired functional mobility, balance, gait, and endurance  Rationale for Evaluation and Treatment: Rehabilitation  ONSET DATE: 06/30/24 RTKA  SUBJECTIVE:   SUBJECTIVE STATEMENT: Pt arrived with her daughter today for session.  Stated she has been under a lot of stress with husband now moved to nursing home in [place].  No reports of pain in knee, does have some Rt hip pain today, pain scale 6/10.  Eval:  Pt states she had the surgery the 11th. Pt states she had home health PT about 6 times and had some issues with the oxygen . Previously she jut wore oxygen  at night. Pt has many ongoing health issues and has been struggling with symptoms of UTI and diarrhea the past few days.   PERTINENT HISTORY: -Been using husbands supplemental O2 since home therapy, had just been using it at night before the surgery PAIN:  Are you having pain? Yes: NPRS scale: 3/10 Pain location: right knee Pain description: ache Aggravating factors: being up on it Relieving factors: rest elevated  PRECAUTIONS: Fall  RED FLAGS: UTI symptoms during  urination   WEIGHT BEARING RESTRICTIONS: WBAT  FALLS:  Has patient fallen in last 6 months? No  LIVING ENVIRONMENT: Lives with: lives with their spouse Lives in: House/apartment Stairs: No Has following equipment at home: Environmental consultant - 2 wheeled  OCCUPATION: retired, Biomedical engineer  PLOF: Requires assistive device for independence, Needs assistance with ADLs, and Needs assistance with gait  PATIENT GOALS: walk faster, decrease the knee pain, improve balance, increase LE strength  NEXT MD VISIT: Next Tuesday for Oxygen    OBJECTIVE:  Note:  Objective measures were completed at Evaluation unless otherwise noted.  DIAGNOSTIC FINDINGS: none  PATIENT SURVEYS:  LEFS : 35/80 08/27/24:   LEFS  Extreme difficulty/unable (0), Quite a bit of difficulty (1), Moderate difficulty (2), Little difficulty (3), No difficulty (4) Survey date:   08/27/24:  Any of your usual work, housework or school activities 3  2. Usual hobbies, recreational or sporting activities 4  3. Getting into/out of the bath 4  4. Walking between rooms 4  5. Putting on socks/shoes 4  6. Squatting  1  7. Lifting an object, like a bag of groceries from the floor 4  8. Performing light activities around your home 4  9. Performing heavy activities around your home 3  10. Getting into/out of a car 1  11. Walking 2 blocks 4  12. Walking 1 mile 0  13. Going up/down 10 stairs (1 flight) 1  14. Standing for 1 hour 1  15.  sitting for 1 hour 4  16. Running on even ground 0  17. Running on uneven ground 0  18. Making sharp turns while running fast 0  19. Hopping  0  20. Rolling over in bed 4  Score total:  46/80      COGNITION: Overall cognitive status: Within functional limits for tasks assessed     SENSATION: Light touch: Impaired numbness around incision  EDEMA:  RLE knee and ankle  PALPATION: Tenderness to palpation at posterior knee and incision area.  LOWER EXTREMITY ROM:  Active ROM Right eval  Left eval Right 07/29/24 Right 08/21/24 Left  Hip flexion       Hip extension       Hip abduction       Hip adduction       Hip internal rotation       Hip external rotation       Knee flexion 121  130 WFL   Knee extension 3 from neutral  3 8 from neutral 4 from neutral  Ankle dorsiflexion       Ankle plantarflexion       Ankle inversion       Ankle eversion        (Blank rows = not tested)  LOWER EXTREMITY MMT:  MMT Right 07/29/24 Left 07/29/24 Right 08/21/24 Left 08/21/24  Hip flexion 3+ 3/5 3+ 4-  Hip extension Cannot tolerate prone, complete sidelying next session. Cannot tolerate prone, complete sidelying next session. 4-, seated  4+, seated  Hip abduction 3/5 3/5 4- 4-  Hip adduction   3+ 3+  Hip internal rotation      Hip external rotation      Knee flexion   4+ 4+  Knee extension   3+, slight pain 4+  Ankle dorsiflexion      Ankle plantarflexion      Ankle inversion      Ankle eversion       (Blank rows = not tested)    FUNCTIONAL TESTS:  5 times sit to stand: 16.48 seconds, 94% O2, with [place] 2 minute walk test: 185 feet, 99% O2 with [place] 08/21/24: 5TSTS, 12.27 seconds, BUE support on RA , 271 feet 08/27/24: FGA: 16 / 30 = 53.3 %  GAIT: Distance walked: 200 feet Assistive device utilized: Walker - 2 wheeled Level of assistance: Modified independence Comments: Pt requires therapist assist to carry supplemental O2 supply, decreased gait speed noted, decreased visual acuity noted, and slight antalgic pattern noted with decreased stance time on RLE although minimal.  TREATMENT DATE:  08/27/24 Nustep UE/LE x 8' seat 8 for extension LEFS questioned during Nustep: 46/80= 57.5% FGA: 16 / 30 = 53.3 %  Mild impairment -- gait level surface (2) Moderate impairment -- change in gait speed (1);  Mild impairment -- gait with horizontal head  turns (2);  Mild impairment -- gait with vertical head turns (2);  Mild impairment -- gait and pivot turn (2);  Moderate impairment -- step over obstacle (1);  Mild impairment -- gait with eyes closed (2);  Mild impairment -- ambulating backwards (2);  Mild impairment -- up and down steps (2); 6in step up and over 2x 10 Squat front of chair 2x 10 cueing for mechanics   08/21/2024  Progress note: pt educated on goal progression, importance of HEP compliance, and POC. Therapeutic Exercise: -Stationary bike, 4 minutes, pt able to power up bike immediately, pt cued for increased pace, level 4 resistance -Heel raises, 1 set of 15 reps, pt cued for decreased UE support and increased eccentric control Neuromuscular Re-education: -Fwd lunges on bosu ball, pt cued for one UE support, 2 set of 5 reps bilaterally Therapeutic Activity: -Step up and overs, 8 inch step, 1 set of 5 reps bilaterally, pt cued for increased time with turn -Lateral step up and overs, 8 inch step, 1 set of 5 reps bilaterally, pt cued for increased step length  08/19/24: Nustep seat 8 x 5' Standing: - Heel raises incline slope 20x   - Toe raises decline slope 20x  - STS too difficult standard height, added foam and cueing for foot position and nose over toes with ability to complete 2x 10   - Squat with multimodal cueing for mechanics, heavy UE support  - Abduction with 2# BLE 15x  - Extension 2# 15x  - Reciprocal pattern 4in 2RT 1 HR  - Tandem stance 2x 30  - SLS 2-3 max of 5  Reports of dizziness, sat, water   Vitals: 169/84 mmHg HR 107bpm  08/14/24: Nustep seat 8 x 5' Standing by // bars:  - Heel raises incline slope 20x   - Toe raises decline slope 20x  - STS too difficult standard height, added foam and cueing for foot position and nose over toes with ability to complete 2x 10  -TKE RTB 15x5  - 6in lateral step up 2x 10  - 6in forward up and over10x   - Leg press 3Pl 2x 10  Supine: AROM 1-134  degrees    PATIENT EDUCATION:  Education details: Pt was educated on findings of PT evaluation, prognosis, frequency of therapy visits and rationale, attendance policy, and HEP if given.   Person educated: Patient and Spouse Education method: Explanation, Verbal cues, and Handouts Education comprehension: verbalized understanding, verbal cues required, and needs further education  HOME EXERCISE PROGRAM: Access Code: [email or web address] URL: [email or web address] Date: 07/17/2024 Prepared by: Lang Ada  Exercises - Seated Knee Extension AROM  - 1 x daily - 7 x weekly - 3 sets - 10 reps - Sit to Stand with Armchair  - 1 x daily - 7 x weekly - 3 sets - 10 reps - Heel Raises with Counter Support  - 1 x daily - 7 x weekly - 3 sets - 10 reps  07/29/24: Bridge 10x 5  ASSESSMENT:  CLINICAL IMPRESSION: Began on Nustep for dynamic warm up, LEFS survey complete with improved self perceived functional abilities score raised to 46/80 was originally 35/80.  FGA complete, pt with need for RW due to visual impairements.  Presents with minimal changes of gait speed and does slow down with headmovements, good reaction times with stop/go and good speed with rotations.  Requires AD with NBOS and retro gait for safety.  Ability to ambulate reciprocal pattern with stairs, required handrail assistance and presents with decreased eccentric control descending stairs due to weakness.  Rest of the session focus on functional strengthening to improve quad control and gluteal strengthening with multimodal cueing required for proper squat mechanics, presents with heavy UE support during these exercises.  No reports of pain in knee and hip pain remains same as beginning of session.     Eval:  Patient is a 78 y.o. female who was seen today for physical therapy evaluation and treatment for s/p rt total knee replacement. Patient demonstrates decreased RLE strength, abnormal pain rating, decreased endurance, and  impaired balance. Pt reports having symptoms of UTI and drop in O2 saturation into the 30s during home health PT session and has been using spouses old supplemental O2 tank for community ambulation. O2 levels stable throughout session today with supplemental O2, pt states she has appontment with primary care next Tuesday to address abnormal O2 levels. Patient also demonstrates difficulty with ambulation during today's session with need for therapist assist for supplemental O2 tank carrry and RW for balance, decreased stride length and velocity noted. Patient also demonstrates 121 degrees of knee flexion with no signs of pain and 3 degrees from neutral extension. Pt demonstrates tenderness to palpation to knee cap although mobility WFL, and posterior knee. Pt unable to stand without UE support for assist this date. Patient requires education on importance of HEP compliance, DVT, prognosis and role of PT. Patient would benefit from skilled physical therapy for decreased R knee pain, increased endurance with ambulation, increased RLE strength, and balance for improved gait quality, return to higher level of function with ADLs, and progress towards therapy goals.  OBJECTIVE IMPAIRMENTS: Abnormal gait, decreased activity tolerance, decreased balance, decreased endurance, decreased knowledge of use of DME, decreased mobility, difficulty walking, decreased ROM, decreased strength, impaired sensation, and pain.   ACTIVITY LIMITATIONS: carrying, lifting, bending, standing, squatting, sleeping, stairs, transfers, and bed mobility  PARTICIPATION LIMITATIONS: meal prep, laundry, interpersonal relationship, shopping, community activity, and yard work  PERSONAL FACTORS: Age, Fitness, Past/current experiences, and Time since onset of injury/illness/exacerbation are also affecting patient's functional outcome.   REHAB POTENTIAL: Good  CLINICAL DECISION MAKING: Stable/uncomplicated  EVALUATION COMPLEXITY:  Low   GOALS: Goals reviewed with patient? Yes  SHORT TERM GOALS: Target date: 08/07/24  Patient will demonstrate evidence of independence with individualized HEP and will report compliance for at least 3 days per week for optimized progression towards remaining therapy goals. Baseline: 08/21/24, pt states she's been too busy with caring for husband Goal status: IN PROGRESS  2.  Patient will report a decrease in pain level during community ambulation by at least 2 points for improved quality of life. Baseline: 3/10 Goal status: MET     LONG TERM GOALS: Target date: 08/28/24  Pt will demonstrate a an increase of at least 9 points on the LEFS for improved performance of community ambulation and ADL. Baseline: see objective Goal status: INITIAL  2.  Pt will improve 2 MWT by 50 feet in order to demonstrate improved functional ambulatory capacity in community setting.  Baseline: see objective Goal status: MET  3.  Pt will demonstrate WFL ROM (flexion and extension) in right knee, for increased mobility and maximal efficiency of gait cycle during ambulation. Baseline:  see objective Goal status: IN PROGRESS  4.  Pt will demonstrate at least 4-/5 MMT for right lower extremity for increased strength during ADL and community ambulation. Baseline: see objective Goal status: IN PROGRESS  5.  Pt will improve 5TSTS by at least 2.3 seconds in order to improve strength during functional activities.. Baseline: see objective Goal status: MET  6. Pt will demonstrate the ability to score at least a 22/30 on FGA to demonstrate decreased fall risk during ambulation and functional mobility. Baseline: see objective; 08/27/24: FGA 16/30 Goal status: Added 07/30/24  PLAN:  PT FREQUENCY: 1-2x/week  PT DURATION: 3 weeks  PLANNED INTERVENTIONS: 97110-Therapeutic exercises, 97530- Therapeutic activity, 97112- Neuromuscular re-education, 97535- Self Care, [id number]- Manual therapy, [phone]- Gait training,  Patient/Family education, Balance training, Stair training, Joint mobilization, DME instructions, Cryotherapy, and Moist heat  PLAN FOR NEXT SESSION: Improve knee extension range and progress RLE strengthening, progress gait training with LRAD (pt vision would probably not allow SPC usage at home).  Squat mechanics training, step down training for quad and hip stability for balance.  Augustin Mclean, LPTA/CLT; CBIS [phone]  11:45 AM, 08/27/24

## 2024-08-27 NOTE — ED Triage Notes (Signed)
 Pt is brought in due to flank pain.  Pt has had UTI recently and had to take 2 rounds of antibiotics for this but her daughter who beings her in feels that it has not resolved.

## 2024-08-27 NOTE — ED Notes (Signed)
 Pt stated they were in too much pain and are going to leave due to wait time. Moved OTF

## 2024-08-27 NOTE — ED Notes (Signed)
 Urine cup given and labeled with pt sticker and pt and visitor advised that urine sample is needed.

## 2024-08-31 NOTE — Progress Notes (Unsigned)
 Cardiology Office Note:  .   Date:  08/31/2024  ID:  Inocente ONEIDA Conrad, DOB 12/11/1945, MRN [id number] PCP: Teresa Channel, MD  [facility] Providers Cardiologist:  Maude Emmer, MD {  History of Present Illness: .   ALIEAH BRINTON is a 78 y.o. female w/PMHx of  RA, chronic back pain (has spinal cord stimulator) Uses O2 at HS HTN, HLD CAD  (CABG 1996) ICM, chronic CHF LBBB ICD   She saw Dr. Waddell 521/25, discussed worsening HF symptoms, development of LBBB, planned for ICD > CRT  05/14/24, single chamber ICD > CRT-D  Saw Dr. Rolan 07/08/24 S/p TKR, not too active since, though working on it Planned for TTE post CRT  LVEF up to 45-50%  Today's visit is scheduled as her 90 day post upgrade visit ROS:   Generally she feels like she is doing well Since her upgrade feels like she has generally more stamina. Today has been a very busy day, had PT for her knee and 3 appointments, tired today  No CP, palpitations No rest SOB, minimal DOE, still working on PT after her knee surgery in Aug No near syncope or syncope   Device information SJM single chamber ICD implanted 02/04/2021, upgrade to [employer] CRT-D 05/14/24   Hx of: Admitted 02/2023 with CAP, BCx + for Pseudomonas aeruginosa, concern for bacteriemia in presence of ICD. ID and Cardiology consulted, and underwent TEE, showing LVEF 30-35%, normal RV, no LAA thrombus, no valvular or ICD vegetations. She was continued on 2 weeks of ciprofloxacin .   Arrhythmia/AAD hx None   Studies Reviewed: SABRA    EKG not done today 07/08/24 personally reviewed, SR, V paced (back stim artifact), QRS morphology looks OK, duration  DEVICE interrogation done today and reviewed by myself Battery and RA/RV lead measurements are good LV lead impedance is good Threshold is up some from last 1.75/1.0 >> 2.25/1.0 and 2.5/1.0 RA outputs reduced to clinic standard LV lead reduced to 3.25/1.0 CorVue looks good On NSVT   08/25/24:  TTE 1. Left ventricular ejection fraction, by estimation, is 45 to 50%. Left  ventricular ejection fraction by 2D MOD biplane is 46.8 %. Left  ventricular ejection fraction by PLAX is 33 %. The left ventricle has  mildly decreased function. The left ventricle  demonstrates global hypokinesis. There is mild left ventricular  hypertrophy. cannot be evaluated due to significant mitral annular  calcification. The average left ventricular global longitudinal strain is  -14.4 %. The global longitudinal strain is  abnormal.   2. Right ventricular systolic function is normal. The right ventricular  size is normal. Tricuspid regurgitation signal is inadequate for assessing  PA pressure.   3. The mitral valve is normal in structure. Trivial mitral valve  regurgitation. Mild mitral stenosis. The mean mitral valve gradient is 4.0  mmHg with average heart rate of 87 bpm. Moderate mitral annular  calcification.   4. The aortic valve is tricuspid. Aortic valve regurgitation is mild. No  aortic stenosis is present.   5. The inferior vena cava is normal in size with greater than 50%  respiratory variability, suggesting right atrial pressure of 3 mmHg.    Risk Assessment/Calculations:    Physical Exam:   VS:  There were no vitals taken for this visit.   Wt Readings from Last 3 Encounters:  08/27/24 126 lb 1.7 oz (57.2 kg)  07/24/24 126 lb (57.2 kg)  07/22/24 126 lb (57.2 kg)    GEN: Well nourished, well developed  in no acute distress NECK: No JVD; No carotid bruits CARDIAC: RRR, no murmurs, rubs, gallops RESPIRATORY:   CTA b/l without rales, wheezing or rhonchi  ABDOMEN: Soft, non-tender, non-distended EXTREMITIES:  No edema; No deformity   ICD site: is stable, well healed, no thinning, fluctuation, tethering  ASSESSMENT AND PLAN: .    CRT-D Intact function LV threshold up some from last Programmed appropriately Will have her bak in a month to ensure LV lead  stability  CAD ICM Chronic CHF (systolic) Improved LVEF to 45-50% post CRT 98 % BP Corvue looks good No symptoms or exam findings of volume OL  C/w Dr. Marciano    Dispo: as above, sooner if needed  Signed, Charlies Macario Arthur, PA-C

## 2024-09-01 ENCOUNTER — Ambulatory Visit
Admission: RE | Admit: 2024-09-01 | Discharge: 2024-09-01 | Disposition: A | Discharge status: Home / Self Care | Source: Ambulatory Visit | Attending: Family Medicine | Admitting: Family Medicine

## 2024-09-01 ENCOUNTER — Ambulatory Visit: Attending: Physician Assistant | Admitting: Physician Assistant

## 2024-09-01 ENCOUNTER — Ambulatory Visit (HOSPITAL_COMMUNITY)

## 2024-09-01 VITALS — BP 102/58 | HR 98 | Ht 62.0 in | Wt 128.0 lb

## 2024-09-01 DIAGNOSIS — I5022 Chronic systolic (congestive) heart failure: Secondary | ICD-10-CM | POA: Diagnosis not present

## 2024-09-01 DIAGNOSIS — I38 Endocarditis, valve unspecified: Secondary | ICD-10-CM

## 2024-09-01 DIAGNOSIS — I2510 Atherosclerotic heart disease of native coronary artery without angina pectoris: Secondary | ICD-10-CM

## 2024-09-01 DIAGNOSIS — Z95810 Presence of automatic (implantable) cardiac defibrillator: Secondary | ICD-10-CM

## 2024-09-01 DIAGNOSIS — R921 Mammographic calcification found on diagnostic imaging of breast: Secondary | ICD-10-CM | POA: Diagnosis not present

## 2024-09-01 DIAGNOSIS — I255 Ischemic cardiomyopathy: Secondary | ICD-10-CM

## 2024-09-01 DIAGNOSIS — Z7409 Other reduced mobility: Secondary | ICD-10-CM

## 2024-09-01 DIAGNOSIS — M25561 Pain in right knee: Secondary | ICD-10-CM

## 2024-09-01 DIAGNOSIS — R300 Dysuria: Secondary | ICD-10-CM | POA: Diagnosis not present

## 2024-09-01 LAB — CUP PACEART INCLINIC DEVICE CHECK
Battery Remaining Longevity: 62 mo
Brady Statistic RA Percent Paced: 0.76 %
Brady Statistic RV Percent Paced: 98 %
Date Time Interrogation Session: 20251013165838
HighPow Impedance: 52.875
Implantable Lead Connection Status: 753985
Implantable Lead Connection Status: 753985
Implantable Lead Connection Status: 753985
Implantable Lead Implant Date: 20220318
Implantable Lead Implant Date: 20250625
Implantable Lead Implant Date: 20250625
Implantable Lead Location: 753858
Implantable Lead Location: 753859
Implantable Lead Location: 753860
Implantable Pulse Generator Implant Date: 20250625
Lead Channel Impedance Value: 362.5 Ohm
Lead Channel Impedance Value: 500 Ohm
Lead Channel Impedance Value: 762.5 Ohm
Lead Channel Pacing Threshold Amplitude: 0.5 V
Lead Channel Pacing Threshold Amplitude: 0.5 V
Lead Channel Pacing Threshold Amplitude: 0.5 V
Lead Channel Pacing Threshold Amplitude: 2.5 V
Lead Channel Pacing Threshold Amplitude: 2.5 V
Lead Channel Pacing Threshold Pulse Width: 0.5 ms
Lead Channel Pacing Threshold Pulse Width: 0.5 ms
Lead Channel Pacing Threshold Pulse Width: 0.5 ms
Lead Channel Pacing Threshold Pulse Width: 1 ms
Lead Channel Pacing Threshold Pulse Width: 1 ms
Lead Channel Sensing Intrinsic Amplitude: 12 mV
Lead Channel Sensing Intrinsic Amplitude: 2.5 mV
Lead Channel Setting Pacing Amplitude: 1.5 V
Lead Channel Setting Pacing Amplitude: 2.5 V
Lead Channel Setting Pacing Amplitude: 3.25 V
Lead Channel Setting Pacing Pulse Width: 0.5 ms
Lead Channel Setting Pacing Pulse Width: 1 ms
Lead Channel Setting Sensing Sensitivity: 0.5 mV
Pulse Gen Serial Number: 211047337
Zone Setting Status: 755011

## 2024-09-01 NOTE — Patient Instructions (Signed)
 Medication Instructions:    Your physician recommends that you continue on your current medications as directed. Please refer to the Current Medication list given to you today.   *If you need a refill on your cardiac medications before your next appointment, please call your pharmacy*   Lab Work: NONE ORDERED  TODAY    If you have labs (blood work) drawn today and your tests are completely normal, you will receive your results only by: MyChart Message (if you have MyChart) OR A paper copy in the mail If you have any lab test that is abnormal or we need to change your treatment, we will call you to review the results.    Testing/Procedures: NONE ORDERED  TODAY    Follow-Up: At [facility], you and your health needs are our priority.  As part of our continuing mission to provide you with exceptional heart care, our providers are all part of one team.  This team includes your primary Cardiologist (physician) and Advanced Practice Providers or APPs (Physician Assistants and Nurse Practitioners) who all work together to provide you with the care you need, when you need it.  Your next appointment:    1 month(s)   Provider:   Charlies Arthur, PA-C ( CONTACT  CASSIE HALL/ ANGELINE HAMMER FOR EP SCHEDULING ISSUES )    We recommend signing up for the patient portal called MyChart.  Sign up information is provided on this After Visit Summary.  MyChart is used to connect with patients for Virtual Visits (Telemedicine).  Patients are able to view lab/test results, encounter notes, upcoming appointments, etc.  Non-urgent messages can be sent to your provider as well.   To learn more about what you can do with MyChart, go to [email or web address].   Other Instructions

## 2024-09-01 NOTE — Therapy (Signed)
 OUTPATIENT PHYSICAL THERAPY LOWER EXTREMITY TREATMENT   Patient Name: Katherine Larson MRN: [id number] DOB:1946/02/14, 78 y.o., female Today's Date: 09/01/2024  END OF SESSION:  PT End of Session - 09/01/24 0956     Visit Number 10    Number of Visits 12    Date for Recertification  09/12/24    Authorization Type [employer] MEDICARE    Authorization Time Period uhc covered 10 visits from 07/17/24-08/21/2024    Authorization - Visit Number 8    Authorization - Number of Visits 10    Progress Note Due on Visit 10    PT Start Time [phone]    PT Stop Time 1030    PT Time Calculation (min) 38 min    Equipment Utilized During Treatment Gait belt    Activity Tolerance Patient tolerated treatment well    Behavior During Therapy WFL for tasks assessed/performed            Past Medical History:  Diagnosis Date   AICD (automatic cardioverter/defibrillator) present    Anemia    anemia of chronic disease +/- IDA followed by Dr. Gatha. received iron  infusions in the past   Borderline glaucoma    CAD (coronary artery disease)    a. s/p CABG 1996. b. low risk nuc 2011. c. LHC 04/2016 due to drop in EF -> occluded native LAD,  widely patent sequential LIMA-D1-LAD.   Chronic kidney disease    mild stage of kidney disease   Chronic systolic CHF (congestive heart failure) (HCC)    Complication of anesthesia    DM2 (diabetes mellitus, type 2) (HCC)    not on any medicine for this at this time, 05/2016   Dyspnea    with exertion   GERD (gastroesophageal reflux disease)    Gout    History of blood transfusion    History of hiatal hernia    in 20s   History of pneumonia    March, 2016   HLD (hyperlipidemia)    HTN (hypertension)    Inappropriate sinus tachycardia    LBBB (left bundle branch block)    a. 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 Dx 04/2016 - EF 25-30%, diffuse HK, elevated LVEDP, mild MR, mod LAE.   Normocytic anemia 10/17/2021   NSVT (nonsustained ventricular tachycardia) (HCC)    PAT (paroxysmal atrial tachycardia)    Pneumonia    PONV (postoperative nausea and vomiting)    after just about every surgery I've had   Rheumatoid arthritis (HCC)    RA- hands   Seasonal allergies    Past Surgical History:  Procedure Laterality Date   ABDOMINAL HYSTERECTOMY     APPENDECTOMY     BACK SURGERY     BIOPSY  04/27/2020   Procedure: BIOPSY;  Surgeon: Shaaron Lamar HERO, MD;  [facility]: [facility];  Service: Endoscopy;;  ascending colon biopsy   BIV UPGRADE N/A 05/14/2024   Procedure: BIV UPGRADE;  Surgeon: Waddell Danelle ORN, MD;  Location: [facility];  Service: Cardiovascular;  Laterality: N/A;   CARDIAC CATHETERIZATION N/A 05/01/2016   Procedure: Right/Left Heart Cath and Coronary/Graft Angiography;  Surgeon: Alm ORN Clay, MD;  Location: [facility] INVASIVE CV LAB;  Service: Cardiovascular;  Laterality: N/A;   CHOLECYSTECTOMY     COLONOSCOPY  11/2011  Dr. Magod:ext/int hemorrhoids, diverticulosis sigmoid colon and distal desc colon, mid-desc colon hyperplastic polyps.    COLONOSCOPY WITH PROPOFOL  N/A 04/27/2020   Rourk: Diverticulosis, hemorrhoids, normal terminal ileum.  Random colon biopsies significant for chronic lymphocytic colitis. No repeat due to age.   CORONARY ARTERY BYPASS GRAFT  1996   2 vessels   ESOPHAGOGASTRODUODENOSCOPY  11/2011   Dr. Rosalie: small hiatal hernia, one non-bleeding superficial gastric ulcer, medium-sized diverticulum in area of papilla   ESOPHAGOGASTRODUODENOSCOPY N/A 12/29/2015   Dr. Shaaron: Chronic inactive gastritis, normal esophagus status post dilation, duodenal diverticula   ESOPHAGOGASTRODUODENOSCOPY (EGD) WITH PROPOFOL  N/A 07/24/2019   Dr. Shaaron: Normal esophagus status post empiric dilation, small hiatal hernia, large D2 diverticulum   ESOPHAGOGASTRODUODENOSCOPY  (EGD) WITH PROPOFOL  N/A 06/03/2021   Surgeon: Shaaron Lamar HERO, MD; normal esophagus, normal stomach, normal duodenum.  Abnormal hypopharyngeal mucosa bilaterally just distal to the base of the tongue (right side greater than left), overlying mucosa appeared irregular and somewhat verrucous in appearance.  Recommended ENT evaluation.   EYE SURGERY Bilateral    cataract removal   FOOT SURGERY     removal bone spur   FRACTURE SURGERY Right    arm   GIVENS CAPSULE STUDY  11/2012   Dr. Rosalie: minimal gastritis, normal small bowel capsule   HERNIA REPAIR     hiatal hernia   ICD IMPLANT N/A 02/04/2021   Procedure: ICD IMPLANT;  Surgeon: Waddell Danelle ORN, MD;  Location: [facility] INVASIVE CV LAB;  Service: Cardiovascular;  Laterality: N/A;   MALONEY DILATION N/A 07/24/2019   Procedure: AGAPITO DILATION;  Surgeon: Shaaron Lamar HERO, MD;  [facility];  Service: Endoscopy;  Laterality: N/A;   MALONEY DILATION N/A 06/03/2021   Procedure: AGAPITO DILATION;  Surgeon: Shaaron Lamar HERO, MD;  [facility]: [facility];  Service: Endoscopy;  Laterality: N/A;   MAXIMUM ACCESS (MAS)POSTERIOR LUMBAR INTERBODY FUSION (PLIF) 1 LEVEL N/A 08/14/2013   Procedure:  MAXIMUM ACCESS SURGERY(MAS) POSTERIOR LUMBAR INTERBODY FUSION LUMBAR THREE-FOUR ;  Surgeon: Alm GORMAN Molt, MD;  Location: [facility];  Service: Neurosurgery;  Laterality: N/A;   MAXIMUM ACCESS SURGERY(MAS) POSTERIOR LUMBAR INTERBODY FUSION LUMBAR THREE-FOUR    PTCA     RIGHT/LEFT HEART CATH AND CORONARY ANGIOGRAPHY N/A 03/26/2020   Procedure: RIGHT/LEFT HEART CATH AND CORONARY ANGIOGRAPHY;  Surgeon: Anner Alm ORN, MD;  Location: [facility] INVASIVE CV LAB;  Service: Cardiovascular;  Laterality: N/A;   SPINAL CORD STIMULATOR BATTERY EXCHANGE N/A 01/19/2021   Procedure: Spinal cord stimulator battery change;  Surgeon: Darlis Deatrice GORMAN, MD;  Location: [facility] OR;  Service: Neurosurgery;  Laterality: N/A;   SPINAL CORD STIMULATOR INSERTION N/A 06/16/2016   Procedure:  LUMBAR SPINAL CORD STIMULATOR INSERTION;  Surgeon: Deward Fabian, MD;  Location: [facility];  Service: Neurosurgery;  Laterality: N/A;  LUMBAR SPINAL CORD STIMULATOR INSERTION   TEE WITHOUT CARDIOVERSION N/A 02/01/2023   Procedure: TRANSESOPHAGEAL ECHOCARDIOGRAM (TEE);  Surgeon: Debera Jayson MATSU, MD;  Location: [facility];  Service: Cardiovascular;  Laterality: N/A;   tens  05/2016   TONSILLECTOMY     TOTAL KNEE ARTHROPLASTY Right 06/30/2024   Procedure: ARTHROPLASTY, KNEE, TOTAL;  Surgeon: Yvone Rush, MD;  Location: [facility] ORS;  Service: Orthopedics;  Laterality: Right;  RIGHT TOTAL KNEE ARTHROPLASTY   Patient Active Problem List   Diagnosis Date Noted   Arthritis of right knee 06/30/2024   Primary osteoarthritis of right knee 06/27/2024   Bacteremia 02/01/2023   Pseudomonas sepsis (HCC) 01/30/2023   Acute respiratory failure  with hypoxia (HCC) 01/28/2023   Multifocal pneumonia 01/28/2023   Melena 11/28/2022   Rectal bleeding 11/28/2022   Upper abdominal pain 11/28/2022   Early satiety 07/27/2022   Nausea without vomiting 07/27/2022   Hypotension 02/10/2022   Normocytic anemia 10/17/2021   Lymphocytic colitis 07/02/2020   Coronary artery disease, occlusive: CTO proxLAD 03/26/2020   Abnormal nuclear stress test 03/26/2020   Chronic diarrhea 03/02/2020   NICM- new drop in EF 25-30% 05/03/2016   Acute pulmonary edema (HCC)    LBBB (left bundle branch block) 04/29/2016   Chronic HFrEF (heart failure with reduced ejection fraction) (HCC) 04/29/2016   Abdominal pain, chronic, epigastric 03/07/2016   Septic shock (HCC)    COPD exacerbation (HCC) 01/21/2016   Hyperthyroidism 01/21/2016   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   Hypokalemia 01/19/2016   Hyponatremia 01/19/2016   Diet-controlled diabetes mellitus (HCC) 01/19/2016   CAP (community acquired pneumonia) 01/18/2016   Mucosal abnormality of stomach    Dysphagia    Constipation 12/08/2015   Gastroesophageal reflux disease 12/08/2015    Abnormal CT scan, esophagus 12/08/2015   Esophageal dysphagia 12/08/2015   Abdominal pain, epigastric 12/08/2015   Loss of weight 12/08/2015   Lower abdominal pain 12/08/2015   Back pain 08/26/2015   Headache 08/26/2015   Essential hypertension 08/26/2015   DM type 2 (diabetes mellitus, type 2) (HCC) 08/26/2015   Sinus tachycardia 06/07/2015   Diarrhea 06/07/2015   Preop cardiovascular exam 07/16/2013   Atrial flutter (HCC) 07/16/2013   Anemia 01/08/2013   Leukocytosis 12/17/2012   DYSPNEA 02/02/2010   DIABETES MELLITUS 07/28/2009   Hyperlipidemia 07/28/2009   CHOLECYSTECTOMY, HX OF 07/28/2009   Hx of CABG '96- cath 05/01/16 07/28/2009   HERNIORRHAPHY, HX OF 07/28/2009    PCP: Teresa Channel, MD   REFERRING PROVIDER: Yvone Rush, MD  REFERRING DIAG: s/p rt total knee replacement  THERAPY DIAG:  Right knee pain, unspecified chronicity  Impaired functional mobility, balance, gait, and endurance  Rationale for Evaluation and Treatment: Rehabilitation  ONSET DATE: 06/30/24 RTKA  SUBJECTIVE:   SUBJECTIVE STATEMENT: Her husband is supposed to be coming home soon; he will be having home health.  Also going to see the kidney doctor, cardiologist, therapy, mammogram; and also viewing her stepson as he passed unexpectedly.  Having burning with urination; went to ED last week but left after they told her it was an 8 hour wait.  Pain in low back right hip and quad today.  3/10  Eval:  Pt states she had the surgery the 11th. Pt states she had home health PT about 6 times and had some issues with the oxygen . Previously she jut wore oxygen  at night. Pt has many ongoing health issues and has been struggling with symptoms of UTI and diarrhea the past few days.   PERTINENT HISTORY: -Been using husbands supplemental O2 since home therapy, had just been using it at night before the surgery PAIN:  Are you having pain? Yes: NPRS scale: 3/10 Pain location: right knee Pain description:  ache Aggravating factors: being up on it Relieving factors: rest elevated  PRECAUTIONS: Fall  RED FLAGS: UTI symptoms during urination   WEIGHT BEARING RESTRICTIONS: WBAT  FALLS:  Has patient fallen in last 6 months? No  LIVING ENVIRONMENT: Lives with: lives with their spouse Lives in: House/apartment Stairs: No Has following equipment at home: Vannie - 2 wheeled  OCCUPATION: retired, Biomedical engineer  PLOF: Requires assistive device for independence, Needs assistance with ADLs, and Needs assistance with gait  PATIENT  GOALS: walk faster, decrease the knee pain, improve balance, increase LE strength  NEXT MD VISIT: Next Tuesday for Oxygen    OBJECTIVE:  Note: Objective measures were completed at Evaluation unless otherwise noted.  DIAGNOSTIC FINDINGS: none  PATIENT SURVEYS:  LEFS : 35/80 08/27/24:   LEFS  Extreme difficulty/unable (0), Quite a bit of difficulty (1), Moderate difficulty (2), Little difficulty (3), No difficulty (4) Survey date:   08/27/24:  Any of your usual work, housework or school activities 3  2. Usual hobbies, recreational or sporting activities 4  3. Getting into/out of the bath 4  4. Walking between rooms 4  5. Putting on socks/shoes 4  6. Squatting  1  7. Lifting an object, like a bag of groceries from the floor 4  8. Performing light activities around your home 4  9. Performing heavy activities around your home 3  10. Getting into/out of a car 1  11. Walking 2 blocks 4  12. Walking 1 mile 0  13. Going up/down 10 stairs (1 flight) 1  14. Standing for 1 hour 1  15.  sitting for 1 hour 4  16. Running on even ground 0  17. Running on uneven ground 0  18. Making sharp turns while running fast 0  19. Hopping  0  20. Rolling over in bed 4  Score total:  46/80      COGNITION: Overall cognitive status: Within functional limits for tasks assessed     SENSATION: Light touch: Impaired numbness around incision  EDEMA:  RLE knee and  ankle  PALPATION: Tenderness to palpation at posterior knee and incision area.  LOWER EXTREMITY ROM:  Active ROM Right eval Left eval Right 07/29/24 Right 08/21/24 Left  Hip flexion       Hip extension       Hip abduction       Hip adduction       Hip internal rotation       Hip external rotation       Knee flexion 121  130 WFL   Knee extension 3 from neutral  3 8 from neutral 4 from neutral  Ankle dorsiflexion       Ankle plantarflexion       Ankle inversion       Ankle eversion        (Blank rows = not tested)  LOWER EXTREMITY MMT:  MMT Right 07/29/24 Left 07/29/24 Right 08/21/24 Left 08/21/24  Hip flexion 3+ 3/5 3+ 4-  Hip extension Cannot tolerate prone, complete sidelying next session. Cannot tolerate prone, complete sidelying next session. 4-, seated  4+, seated  Hip abduction 3/5 3/5 4- 4-  Hip adduction   3+ 3+  Hip internal rotation      Hip external rotation      Knee flexion   4+ 4+  Knee extension   3+, slight pain 4+  Ankle dorsiflexion      Ankle plantarflexion      Ankle inversion      Ankle eversion       (Blank rows = not tested)    FUNCTIONAL TESTS:  5 times sit to stand: 16.48 seconds, 94% O2, with [place] 2 minute walk test: 185 feet, 99% O2 with [place] 08/21/24: 5TSTS, 12.27 seconds, BUE support on RA , 271 feet 08/27/24: FGA: 16 / 30 = 53.3 %  GAIT: Distance walked: 200 feet Assistive device utilized: Walker - 2 wheeled Level of assistance: Modified independence Comments: Pt requires therapist assist to  carry supplemental O2 supply, decreased gait speed noted, decreased visual acuity noted, and slight antalgic pattern noted with decreased stance time on RLE although minimal.                                                                                                                                 TREATMENT DATE:  09/01/24 Discussion of multiple issues going on on her life and the caregivers available Standing  Heel raises on incline 2  finger hold 2 x 10 Toe raises on decline 2 finger hold 2 x 10 Tandem stance x 30 each intermitent use of Ue's Hip abduction 2# 2 x 10 Hip extension 2# 2 x 10 Squats 2 x 10 Seated  LAQ's 2# 2 x 10 with 2 each     08/27/24 Nustep UE/LE x 8' seat 8 for extension LEFS questioned during Nustep: 46/80= 57.5% FGA: 16 / 30 = 53.3 %  Mild impairment -- gait level surface (2) Moderate impairment -- change in gait speed (1);  Mild impairment -- gait with horizontal head turns (2);  Mild impairment -- gait with vertical head turns (2);  Mild impairment -- gait and pivot turn (2);  Moderate impairment -- step over obstacle (1);  Mild impairment -- gait with eyes closed (2);  Mild impairment -- ambulating backwards (2);  Mild impairment -- up and down steps (2); 6in step up and over 2x 10 Squat front of chair 2x 10 cueing for mechanics   08/21/2024  Progress note: pt educated on goal progression, importance of HEP compliance, and POC. Therapeutic Exercise: -Stationary bike, 4 minutes, pt able to power up bike immediately, pt cued for increased pace, level 4 resistance -Heel raises, 1 set of 15 reps, pt cued for decreased UE support and increased eccentric control Neuromuscular Re-education: -Fwd lunges on bosu ball, pt cued for one UE support, 2 set of 5 reps bilaterally Therapeutic Activity: -Step up and overs, 8 inch step, 1 set of 5 reps bilaterally, pt cued for increased time with turn -Lateral step up and overs, 8 inch step, 1 set of 5 reps bilaterally, pt cued for increased step length  08/19/24: Nustep seat 8 x 5' Standing: - Heel raises incline slope 20x   - Toe raises decline slope 20x  - STS too difficult standard height, added foam and cueing for foot position and nose over toes with ability to complete 2x 10   - Squat with multimodal cueing for mechanics, heavy UE support  - Abduction with 2# BLE 15x  - Extension 2# 15x  - Reciprocal pattern 4in 2RT 1 HR  - Tandem  stance 2x 30  - SLS 2-3 max of 5  Reports of dizziness, sat, water   Vitals: 169/84 mmHg HR 107bpm  08/14/24: Nustep seat 8 x 5' Standing by // bars:  - Heel raises incline slope 20x   - Toe raises decline slope 20x  - STS too difficult standard  height, added foam and cueing for foot position and nose over toes with ability to complete 2x 10  -TKE RTB 15x5  - 6in lateral step up 2x 10  - 6in forward up and over10x   - Leg press 3Pl 2x 10  Supine: AROM 1-134 degrees    PATIENT EDUCATION:  Education details: Pt was educated on findings of PT evaluation, prognosis, frequency of therapy visits and rationale, attendance policy, and HEP if given.   Person educated: Patient and Spouse Education method: Explanation, Verbal cues, and Handouts Education comprehension: verbalized understanding, verbal cues required, and needs further education  HOME EXERCISE PROGRAM: Access Code: [email or web address] URL: [email or web address] Date: 07/17/2024 Prepared by: Lang Ada  Exercises - Seated Knee Extension AROM  - 1 x daily - 7 x weekly - 3 sets - 10 reps - Sit to Stand with Armchair  - 1 x daily - 7 x weekly - 3 sets - 10 reps - Heel Raises with Counter Support  - 1 x daily - 7 x weekly - 3 sets - 10 reps  07/29/24: Bridge 10x 5  ASSESSMENT:  CLINICAL IMPRESSION: Today's session with focus on balance and lower extremity strengthening.  Noted right side flank pain perhaps due to possible UTI.  Noted fatigue with squats today towards ends of session.  She does need cues to hold her head up but unable to visualize a target due to impaired vision.  Discussion with patient regarding home environment and caregiver availability at home with her husband coming home soon.  She is also busy with her stepson's recent passing and will call if she cannot make next appt due to funeral.    Patient will benefit from continued skilled therapy services to address deficits and promote return to  optimal function.       Eval:  Patient is a 78 y.o. female who was seen today for physical therapy evaluation and treatment for s/p rt total knee replacement. Patient demonstrates decreased RLE strength, abnormal pain rating, decreased endurance, and impaired balance. Pt reports having symptoms of UTI and drop in O2 saturation into the 30s during home health PT session and has been using spouses old supplemental O2 tank for community ambulation. O2 levels stable throughout session today with supplemental O2, pt states she has appontment with primary care next Tuesday to address abnormal O2 levels. Patient also demonstrates difficulty with ambulation during today's session with need for therapist assist for supplemental O2 tank carrry and RW for balance, decreased stride length and velocity noted. Patient also demonstrates 121 degrees of knee flexion with no signs of pain and 3 degrees from neutral extension. Pt demonstrates tenderness to palpation to knee cap although mobility WFL, and posterior knee. Pt unable to stand without UE support for assist this date. Patient requires education on importance of HEP compliance, DVT, prognosis and role of PT. Patient would benefit from skilled physical therapy for decreased R knee pain, increased endurance with ambulation, increased RLE strength, and balance for improved gait quality, return to higher level of function with ADLs, and progress towards therapy goals.  OBJECTIVE IMPAIRMENTS: Abnormal gait, decreased activity tolerance, decreased balance, decreased endurance, decreased knowledge of use of DME, decreased mobility, difficulty walking, decreased ROM, decreased strength, impaired sensation, and pain.   ACTIVITY LIMITATIONS: carrying, lifting, bending, standing, squatting, sleeping, stairs, transfers, and bed mobility  PARTICIPATION LIMITATIONS: meal prep, laundry, interpersonal relationship, shopping, community activity, and yard work  PERSONAL FACTORS:  Age, Fitness, Past/current experiences, and Time since onset of  injury/illness/exacerbation are also affecting patient's functional outcome.   REHAB POTENTIAL: Good  CLINICAL DECISION MAKING: Stable/uncomplicated  EVALUATION COMPLEXITY: Low   GOALS: Goals reviewed with patient? Yes  SHORT TERM GOALS: Target date: 08/07/24  Patient will demonstrate evidence of independence with individualized HEP and will report compliance for at least 3 days per week for optimized progression towards remaining therapy goals. Baseline: 08/21/24, pt states she's been too busy with caring for husband Goal status: IN PROGRESS  2.  Patient will report a decrease in pain level during community ambulation by at least 2 points for improved quality of life. Baseline: 3/10 Goal status: MET     LONG TERM GOALS: Target date: 08/28/24  Pt will demonstrate a an increase of at least 9 points on the LEFS for improved performance of community ambulation and ADL. Baseline: see objective Goal status: INITIAL  2.  Pt will improve 2 MWT by 50 feet in order to demonstrate improved functional ambulatory capacity in community setting.  Baseline: see objective Goal status: MET  3.  Pt will demonstrate WFL ROM (flexion and extension) in right knee, for increased mobility and maximal efficiency of gait cycle during ambulation. Baseline: see objective Goal status: IN PROGRESS  4.  Pt will demonstrate at least 4-/5 MMT for right lower extremity for increased strength during ADL and community ambulation. Baseline: see objective Goal status: IN PROGRESS  5.  Pt will improve 5TSTS by at least 2.3 seconds in order to improve strength during functional activities.. Baseline: see objective Goal status: MET  6. Pt will demonstrate the ability to score at least a 22/30 on FGA to demonstrate decreased fall risk during ambulation and functional mobility. Baseline: see objective; 08/27/24: FGA 16/30 Goal status: Added  07/30/24  PLAN:  PT FREQUENCY: 1-2x/week  PT DURATION: 3 weeks  PLANNED INTERVENTIONS: 97110-Therapeutic exercises, 97530- Therapeutic activity, 97112- Neuromuscular re-education, 97535- Self Care, [id number]- Manual therapy, [phone]- Gait training, Patient/Family education, Balance training, Stair training, Joint mobilization, DME instructions, Cryotherapy, and Moist heat  PLAN FOR NEXT SESSION: Improve knee extension range and progress RLE strengthening, progress gait training with LRAD (pt vision would probably not allow SPC usage at home).  Squat mechanics training, step down training for quad and hip stability for balance. 10:31 AM, 09/01/24 Naviah Belfield Small Merrit Waugh MPT [facility] [place] [phone]

## 2024-09-03 ENCOUNTER — Ambulatory Visit (HOSPITAL_COMMUNITY)

## 2024-09-03 ENCOUNTER — Encounter (HOSPITAL_COMMUNITY): Payer: Self-pay

## 2024-09-03 DIAGNOSIS — M25561 Pain in right knee: Secondary | ICD-10-CM

## 2024-09-03 DIAGNOSIS — Z7409 Other reduced mobility: Secondary | ICD-10-CM

## 2024-09-03 DIAGNOSIS — R41842 Visuospatial deficit: Secondary | ICD-10-CM

## 2024-09-03 DIAGNOSIS — H548 Legal blindness, as defined in USA: Secondary | ICD-10-CM

## 2024-09-03 NOTE — Therapy (Signed)
 OUTPATIENT PHYSICAL THERAPY LOWER EXTREMITY TREATMENT   Patient Name: Katherine Larson MRN: [id number] DOB:09/28/1946, 78 y.o., female Today's Date: 09/03/2024  END OF SESSION:  PT End of Session - 09/03/24 1248     Visit Number 11    Number of Visits 12    Date for Recertification  09/12/24    Authorization Type [employer] MEDICARE    Authorization Time Period uhc covered 10 visits from 07/17/24-08/21/2024    Authorization - Visit Number 9    Authorization - Number of Visits 10    Progress Note Due on Visit 10    PT Start Time 1248    PT Stop Time 1326    PT Time Calculation (min) 38 min    Equipment Utilized During Treatment Gait belt    Activity Tolerance Patient tolerated treatment well    Behavior During Therapy WFL for tasks assessed/performed             Past Medical History:  Diagnosis Date   AICD (automatic cardioverter/defibrillator) present    Anemia    anemia of chronic disease +/- IDA followed by Dr. Gatha. received iron  infusions in the past   Borderline glaucoma    CAD (coronary artery disease)    a. s/p CABG 1996. b. low risk nuc 2011. c. LHC 04/2016 due to drop in EF -> occluded native LAD,  widely patent sequential LIMA-D1-LAD.   Chronic kidney disease    mild stage of kidney disease   Chronic systolic CHF (congestive heart failure) (HCC)    Complication of anesthesia    DM2 (diabetes mellitus, type 2) (HCC)    not on any medicine for this at this time, 05/2016   Dyspnea    with exertion   GERD (gastroesophageal reflux disease)    Gout    History of blood transfusion    History of hiatal hernia    in 20s   History of pneumonia    March, 2016   HLD (hyperlipidemia)    HTN (hypertension)    Inappropriate sinus tachycardia    LBBB (left bundle branch block)    a. 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 Dx 04/2016 - EF 25-30%, diffuse HK, elevated LVEDP, mild MR, mod LAE.   Normocytic anemia 10/17/2021   NSVT (nonsustained ventricular tachycardia) (HCC)    PAT (paroxysmal atrial tachycardia)    Pneumonia    PONV (postoperative nausea and vomiting)    after just about every surgery I've had   Rheumatoid arthritis (HCC)    RA- hands   Seasonal allergies    Past Surgical History:  Procedure Laterality Date   ABDOMINAL HYSTERECTOMY     APPENDECTOMY     BACK SURGERY     BIOPSY  04/27/2020   Procedure: BIOPSY;  Surgeon: Shaaron Lamar HERO, MD;  Location: [facility];  Service: Endoscopy;;  ascending colon biopsy   BIV UPGRADE N/A 05/14/2024   Procedure: BIV UPGRADE;  Surgeon: Waddell Danelle ORN, MD;  Location: [facility] INVASIVE CV LAB;  Service: Cardiovascular;  Laterality: N/A;   CARDIAC CATHETERIZATION N/A 05/01/2016   Procedure: Right/Left Heart Cath and Coronary/Graft Angiography;  Surgeon: Alm ORN Clay, MD;  Location: [facility] INVASIVE CV LAB;  Service: Cardiovascular;  Laterality: N/A;   CHOLECYSTECTOMY     COLONOSCOPY  11/2011   Dr. Magod:ext/int hemorrhoids, diverticulosis sigmoid colon and distal desc colon, mid-desc colon hyperplastic polyps.    COLONOSCOPY WITH PROPOFOL  N/A 04/27/2020   Rourk: Diverticulosis, hemorrhoids, normal terminal ileum.  Random colon biopsies significant for chronic lymphocytic colitis. No repeat due to age.   CORONARY ARTERY BYPASS GRAFT  1996   2 vessels   ESOPHAGOGASTRODUODENOSCOPY  11/2011   Dr. Rosalie: small hiatal hernia, one non-bleeding superficial gastric ulcer, medium-sized diverticulum in area of papilla   ESOPHAGOGASTRODUODENOSCOPY N/A 12/29/2015   Dr. Shaaron: Chronic inactive gastritis, normal esophagus status post dilation, duodenal diverticula   ESOPHAGOGASTRODUODENOSCOPY (EGD) WITH PROPOFOL  N/A 07/24/2019   Dr. Shaaron: Normal esophagus status post empiric dilation, small hiatal hernia, large D2 diverticulum   ESOPHAGOGASTRODUODENOSCOPY  (EGD) WITH PROPOFOL  N/A 06/03/2021   Surgeon: Shaaron Lamar HERO, MD; normal esophagus, normal stomach, normal duodenum.  Abnormal hypopharyngeal mucosa bilaterally just distal to the base of the tongue (right side greater than left), overlying mucosa appeared irregular and somewhat verrucous in appearance.  Recommended ENT evaluation.   EYE SURGERY Bilateral    cataract removal   FOOT SURGERY     removal bone spur   FRACTURE SURGERY Right    arm   GIVENS CAPSULE STUDY  11/2012   Dr. Rosalie: minimal gastritis, normal small bowel capsule   HERNIA REPAIR     hiatal hernia   ICD IMPLANT N/A 02/04/2021   Procedure: ICD IMPLANT;  Surgeon: Waddell Danelle ORN, MD;  [facility] INVASIVE CV LAB;  Service: Cardiovascular;  Laterality: N/A;   MALONEY DILATION N/A 07/24/2019   Procedure: AGAPITO DILATION;  Surgeon: Shaaron Lamar HERO, MD;  [facility]: [facility];  Service: Endoscopy;  Laterality: N/A;   MALONEY DILATION N/A 06/03/2021   Procedure: AGAPITO DILATION;  Surgeon: Shaaron Lamar HERO, MD;  Location: [facility];  Service: Endoscopy;  Laterality: N/A;   MAXIMUM ACCESS (MAS)POSTERIOR LUMBAR INTERBODY FUSION (PLIF) 1 LEVEL N/A 08/14/2013   Procedure:  MAXIMUM ACCESS SURGERY(MAS) POSTERIOR LUMBAR INTERBODY FUSION LUMBAR THREE-FOUR ;  Surgeon: Alm GORMAN Molt, MD;  Location: [facility];  Service: Neurosurgery;  Laterality: N/A;   MAXIMUM ACCESS SURGERY(MAS) POSTERIOR LUMBAR INTERBODY FUSION LUMBAR THREE-FOUR    PTCA     RIGHT/LEFT HEART CATH AND CORONARY ANGIOGRAPHY N/A 03/26/2020   Procedure: RIGHT/LEFT HEART CATH AND CORONARY ANGIOGRAPHY;  Surgeon: Anner Alm ORN, MD;  Location: [facility] INVASIVE CV LAB;  Service: Cardiovascular;  Laterality: N/A;   SPINAL CORD STIMULATOR BATTERY EXCHANGE N/A 01/19/2021   Procedure: Spinal cord stimulator battery change;  Surgeon: Darlis Deatrice GORMAN, MD;  Location: [facility] OR;  Service: Neurosurgery;  Laterality: N/A;   SPINAL CORD STIMULATOR INSERTION N/A 06/16/2016   Procedure:  LUMBAR SPINAL CORD STIMULATOR INSERTION;  Surgeon: Deward Fabian, MD;  Location: [facility];  Service: Neurosurgery;  Laterality: N/A;  LUMBAR SPINAL CORD STIMULATOR INSERTION   TEE WITHOUT CARDIOVERSION N/A 02/01/2023   Procedure: TRANSESOPHAGEAL ECHOCARDIOGRAM (TEE);  Surgeon: Debera Jayson MATSU, MD;  Location: [facility] OR[facility];  Service: Cardiovascular;  Laterality: N/A;   tens  05/2016   TONSILLECTOMY     TOTAL KNEE ARTHROPLASTY Right 06/30/2024   Procedure: ARTHROPLASTY, KNEE, TOTAL;  Surgeon: Yvone Rush, MD;  Location: [facility] ORS;  Service: Orthopedics;  Laterality: Right;  RIGHT TOTAL KNEE ARTHROPLASTY   Patient Active Problem List   Diagnosis Date Noted   Arthritis of right knee 06/30/2024   Primary osteoarthritis of right knee 06/27/2024   Bacteremia 02/01/2023   Pseudomonas sepsis (HCC) 01/30/2023  Acute respiratory failure with hypoxia (HCC) 01/28/2023   Multifocal pneumonia 01/28/2023   Melena 11/28/2022   Rectal bleeding 11/28/2022   Upper abdominal pain 11/28/2022   Early satiety 07/27/2022   Nausea without vomiting 07/27/2022   Hypotension 02/10/2022   Normocytic anemia 10/17/2021   Lymphocytic colitis 07/02/2020   Coronary artery disease, occlusive: CTO proxLAD 03/26/2020   Abnormal nuclear stress test 03/26/2020   Chronic diarrhea 03/02/2020   NICM- new drop in EF 25-30% 05/03/2016   Acute pulmonary edema (HCC)    LBBB (left bundle branch block) 04/29/2016   Chronic HFrEF (heart failure with reduced ejection fraction) (HCC) 04/29/2016   Abdominal pain, chronic, epigastric 03/07/2016   Septic shock (HCC)    COPD exacerbation (HCC) 01/21/2016   Hyperthyroidism 01/21/2016   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   Hypokalemia 01/19/2016   Hyponatremia 01/19/2016   Diet-controlled diabetes mellitus (HCC) 01/19/2016   CAP (community acquired pneumonia) 01/18/2016   Mucosal abnormality of stomach    Dysphagia    Constipation 12/08/2015   Gastroesophageal reflux disease 12/08/2015    Abnormal CT scan, esophagus 12/08/2015   Esophageal dysphagia 12/08/2015   Abdominal pain, epigastric 12/08/2015   Loss of weight 12/08/2015   Lower abdominal pain 12/08/2015   Back pain 08/26/2015   Headache 08/26/2015   Essential hypertension 08/26/2015   DM type 2 (diabetes mellitus, type 2) (HCC) 08/26/2015   Sinus tachycardia 06/07/2015   Diarrhea 06/07/2015   Preop cardiovascular exam 07/16/2013   Atrial flutter (HCC) 07/16/2013   Anemia 01/08/2013   Leukocytosis 12/17/2012   DYSPNEA 02/02/2010   DIABETES MELLITUS 07/28/2009   Hyperlipidemia 07/28/2009   CHOLECYSTECTOMY, HX OF 07/28/2009   Hx of CABG '96- cath 05/01/16 07/28/2009   HERNIORRHAPHY, HX OF 07/28/2009    PCP: Teresa Channel, MD   REFERRING PROVIDER: Yvone Rush, MD  REFERRING DIAG: s/p rt total knee replacement  THERAPY DIAG:  Right knee pain, unspecified chronicity  Impaired functional mobility, balance, gait, and endurance  Visuospatial deficit  Legally blind  Rationale for Evaluation and Treatment: Rehabilitation  ONSET DATE: 06/30/24 RTKA  SUBJECTIVE:   SUBJECTIVE STATEMENT: Pt reports no pain and no falls. Pt states husband is no in a rehab and improving. Pt states the left knee is actually bothering her now, needs to be replaced as well.   Eval:  Pt states she had the surgery the 11th. Pt states she had home health PT about 6 times and had some issues with the oxygen . Previously she jut wore oxygen  at night. Pt has many ongoing health issues and has been struggling with symptoms of UTI and diarrhea the past few days.   PERTINENT HISTORY: -Been using husbands supplemental O2 since home therapy, had just been using it at night before the surgery PAIN:  Are you having pain? Yes: NPRS scale: 3/10 Pain location: right knee Pain description: ache Aggravating factors: being up on it Relieving factors: rest elevated  PRECAUTIONS: Fall  RED FLAGS: UTI symptoms during urination   WEIGHT  BEARING RESTRICTIONS: WBAT  FALLS:  Has patient fallen in last 6 months? No  LIVING ENVIRONMENT: Lives with: lives with their spouse Lives in: House/apartment Stairs: No Has following equipment at home: Environmental consultant - 2 wheeled  OCCUPATION: retired, Biomedical engineer  PLOF: Requires assistive device for independence, Needs assistance with ADLs, and Needs assistance with gait  PATIENT GOALS: walk faster, decrease the knee pain, improve balance, increase LE strength  NEXT MD VISIT: Next Tuesday for Oxygen    OBJECTIVE:  Note: Objective  measures were completed at Evaluation unless otherwise noted.  DIAGNOSTIC FINDINGS: none  PATIENT SURVEYS:  LEFS : 35/80 08/27/24:   LEFS  Extreme difficulty/unable (0), Quite a bit of difficulty (1), Moderate difficulty (2), Little difficulty (3), No difficulty (4) Survey date:   08/27/24:  Any of your usual work, housework or school activities 3  2. Usual hobbies, recreational or sporting activities 4  3. Getting into/out of the bath 4  4. Walking between rooms 4  5. Putting on socks/shoes 4  6. Squatting  1  7. Lifting an object, like a bag of groceries from the floor 4  8. Performing light activities around your home 4  9. Performing heavy activities around your home 3  10. Getting into/out of a car 1  11. Walking 2 blocks 4  12. Walking 1 mile 0  13. Going up/down 10 stairs (1 flight) 1  14. Standing for 1 hour 1  15.  sitting for 1 hour 4  16. Running on even ground 0  17. Running on uneven ground 0  18. Making sharp turns while running fast 0  19. Hopping  0  20. Rolling over in bed 4  Score total:  46/80      COGNITION: Overall cognitive status: Within functional limits for tasks assessed     SENSATION: Light touch: Impaired numbness around incision  EDEMA:  RLE knee and ankle  PALPATION: Tenderness to palpation at posterior knee and incision area.  LOWER EXTREMITY ROM:  Active ROM Right eval Left eval Right 07/29/24  Right 08/21/24 Left  Hip flexion       Hip extension       Hip abduction       Hip adduction       Hip internal rotation       Hip external rotation       Knee flexion 121  130 WFL   Knee extension 3 from neutral  3 8 from neutral 4 from neutral  Ankle dorsiflexion       Ankle plantarflexion       Ankle inversion       Ankle eversion        (Blank rows = not tested)  LOWER EXTREMITY MMT:  MMT Right 07/29/24 Left 07/29/24 Right 08/21/24 Left 08/21/24  Hip flexion 3+ 3/5 3+ 4-  Hip extension Cannot tolerate prone, complete sidelying next session. Cannot tolerate prone, complete sidelying next session. 4-, seated  4+, seated  Hip abduction 3/5 3/5 4- 4-  Hip adduction   3+ 3+  Hip internal rotation      Hip external rotation      Knee flexion   4+ 4+  Knee extension   3+, slight pain 4+  Ankle dorsiflexion      Ankle plantarflexion      Ankle inversion      Ankle eversion       (Blank rows = not tested)    FUNCTIONAL TESTS:  5 times sit to stand: 16.48 seconds, 94% O2, with [place] 2 minute walk test: 185 feet, 99% O2 with [place] 08/21/24: 5TSTS, 12.27 seconds, BUE support on RA , 271 feet 08/27/24: FGA: 16 / 30 = 53.3 %  GAIT: Distance walked: 200 feet Assistive device utilized: Walker - 2 wheeled Level of assistance: Modified independence Comments: Pt requires therapist assist to carry supplemental O2 supply, decreased gait speed noted, decreased visual acuity noted, and slight antalgic pattern noted with decreased stance time on RLE although minimal.  TREATMENT DATE:  09/03/2024  Therapeutic Exercise: -Stationary bike, 5 minutes, pt able to power up bike immediately, pt cued for increased pace, level 4 resistance -Leg Press, 1 sets of 10 reps, 4 plates, pt cued for increased eccentric control -Long arc quads, 1 set of 8 reps, 5lb ankle  weights, pt cued for increased ROM and increased eccentric control Therapeutic Activity: -Walking marches and butt kicks, 2 laps, 50 foot laps, pt cued for increased LE ROM, 5lb weight -Sled pushes/pulls, 2 laps, 40 feet per lap, 30lb on sled, pt cued for consistent movement of sled especially during pull movement -Stair ambulation, 2 bouts on 3 and 7 inch steps, in clinic, pt cued for decreased speed and controlled lowering on 7 inch steps  09/01/24 Discussion of multiple issues going on on her life and the caregivers available Standing  Heel raises on incline 2 finger hold 2 x 10 Toe raises on decline 2 finger hold 2 x 10 Tandem stance x 30 each intermitent use of Ue's Hip abduction 2# 2 x 10 Hip extension 2# 2 x 10 Squats 2 x 10 Seated  LAQ's 2# 2 x 10 with 2 each     08/27/24 Nustep UE/LE x 8' seat 8 for extension LEFS questioned during Nustep: 46/80= 57.5% FGA: 16 / 30 = 53.3 %  Mild impairment -- gait level surface (2) Moderate impairment -- change in gait speed (1);  Mild impairment -- gait with horizontal head turns (2);  Mild impairment -- gait with vertical head turns (2);  Mild impairment -- gait and pivot turn (2);  Moderate impairment -- step over obstacle (1);  Mild impairment -- gait with eyes closed (2);  Mild impairment -- ambulating backwards (2);  Mild impairment -- up and down steps (2); 6in step up and over 2x 10 Squat front of chair 2x 10 cueing for mechanics    PATIENT EDUCATION:  Education details: Pt was educated on findings of PT evaluation, prognosis, frequency of therapy visits and rationale, attendance policy, and HEP if given.   Person educated: Patient and Spouse Education method: Explanation, Verbal cues, and Handouts Education comprehension: verbalized understanding, verbal cues required, and needs further education  HOME EXERCISE PROGRAM: Access Code: [email or web address] URL: [email or web address] Date: 07/17/2024 Prepared by:  Lang Ada  Exercises - Seated Knee Extension AROM  - 1 x daily - 7 x weekly - 3 sets - 10 reps - Sit to Stand with Armchair  - 1 x daily - 7 x weekly - 3 sets - 10 reps - Heel Raises with Counter Support  - 1 x daily - 7 x weekly - 3 sets - 10 reps  07/29/24: Bridge 10x 5  ASSESSMENT:  CLINICAL IMPRESSION: Patient continues to demonstrate improved LE strength/ROM, increased gait quality and balance. Patient also demonstrates increased endurance with aerobic based exercise during today's session although increased resistance decreased SPM on stationary bike. Patient able to continue dynamic balance and R knee activation exercises today with resisted walking and sled pushes/pulls, good performance with verbal cueing. Patient would continue to benefit from skilled physical therapy for increased endurance with ambulation, increased LE strength/ROM, and improved balance for improved quality of life, improved independence with gait training and continued progress towards therapy goals. Probable discharge next session, HEP to be revised.     Eval:  Patient is a 78 y.o. female who was seen today for physical therapy evaluation and treatment for s/p rt total knee replacement. Patient demonstrates decreased RLE strength, abnormal pain rating, decreased  endurance, and impaired balance. Pt reports having symptoms of UTI and drop in O2 saturation into the 30s during home health PT session and has been using spouses old supplemental O2 tank for community ambulation. O2 levels stable throughout session today with supplemental O2, pt states she has appontment with primary care next Tuesday to address abnormal O2 levels. Patient also demonstrates difficulty with ambulation during today's session with need for therapist assist for supplemental O2 tank carrry and RW for balance, decreased stride length and velocity noted. Patient also demonstrates 121 degrees of knee flexion with no signs of pain and 3 degrees from  neutral extension. Pt demonstrates tenderness to palpation to knee cap although mobility WFL, and posterior knee. Pt unable to stand without UE support for assist this date. Patient requires education on importance of HEP compliance, DVT, prognosis and role of PT. Patient would benefit from skilled physical therapy for decreased R knee pain, increased endurance with ambulation, increased RLE strength, and balance for improved gait quality, return to higher level of function with ADLs, and progress towards therapy goals.  OBJECTIVE IMPAIRMENTS: Abnormal gait, decreased activity tolerance, decreased balance, decreased endurance, decreased knowledge of use of DME, decreased mobility, difficulty walking, decreased ROM, decreased strength, impaired sensation, and pain.   ACTIVITY LIMITATIONS: carrying, lifting, bending, standing, squatting, sleeping, stairs, transfers, and bed mobility  PARTICIPATION LIMITATIONS: meal prep, laundry, interpersonal relationship, shopping, community activity, and yard work  PERSONAL FACTORS: Age, Fitness, Past/current experiences, and Time since onset of injury/illness/exacerbation are also affecting patient's functional outcome.   REHAB POTENTIAL: Good  CLINICAL DECISION MAKING: Stable/uncomplicated  EVALUATION COMPLEXITY: Low   GOALS: Goals reviewed with patient? Yes  SHORT TERM GOALS: Target date: 08/07/24  Patient will demonstrate evidence of independence with individualized HEP and will report compliance for at least 3 days per week for optimized progression towards remaining therapy goals. Baseline: 08/21/24, pt states she's been too busy with caring for husband Goal status: IN PROGRESS  2.  Patient will report a decrease in pain level during community ambulation by at least 2 points for improved quality of life. Baseline: 3/10 Goal status: MET     LONG TERM GOALS: Target date: 08/28/24  Pt will demonstrate a an increase of at least 9 points on the  LEFS for improved performance of community ambulation and ADL. Baseline: see objective Goal status: INITIAL  2.  Pt will improve 2 MWT by 50 feet in order to demonstrate improved functional ambulatory capacity in community setting.  Baseline: see objective Goal status: MET  3.  Pt will demonstrate WFL ROM (flexion and extension) in right knee, for increased mobility and maximal efficiency of gait cycle during ambulation. Baseline: see objective Goal status: IN PROGRESS  4.  Pt will demonstrate at least 4-/5 MMT for right lower extremity for increased strength during ADL and community ambulation. Baseline: see objective Goal status: IN PROGRESS  5.  Pt will improve 5TSTS by at least 2.3 seconds in order to improve strength during functional activities.. Baseline: see objective Goal status: MET  6. Pt will demonstrate the ability to score at least a 22/30 on FGA to demonstrate decreased fall risk during ambulation and functional mobility. Baseline: see objective; 08/27/24: FGA 16/30 Goal status: Added 07/30/24  PLAN:  PT FREQUENCY: 1-2x/week  PT DURATION: 3 weeks  PLANNED INTERVENTIONS: 97110-Therapeutic exercises, 97530- Therapeutic activity, 97112- Neuromuscular re-education, 97535- Self Care, [id number]- Manual therapy, [phone]- Gait training, Patient/Family education, Balance training, Stair training, Joint mobilization, DME instructions, Cryotherapy, and Moist  heat  PLAN FOR NEXT SESSION: Update HEP and discharge next session.  Reem Fleury, PT, DPT [facility] Office: [phone] 1:31 PM, 09/03/24

## 2024-09-05 NOTE — Progress Notes (Unsigned)
 [facility] [address] [place], [place] [address]   CLINIC:  Medical Oncology/Hematology  PCP:  Teresa Channel, MD [address] [place] [address] [phone]   REASON FOR VISIT:  Follow-up for anemia of CKD/functional iron  deficiency + leukocytosis   CURRENT THERAPY: Intermittent IV iron  + Retacrit  10,000 units monthly  INTERVAL HISTORY:   [name] 78 y.o. female returns for routine follow-up of anemia of CKD/functional iron  deficiency. She was last seen by Pleasant Barefoot PA-C on 06/16/2024. She reports *** energy. She continues to receive monthly Retacrit .*** She is tolerating well without any major side effects.  ***Blood pressure has been at goal. *** No symptoms concerning for DVT or PE. ***  She  denies any bleeding episodes.   No pica, chest pain, syncope, or dyspnea on exertion.   *** She denies any B symptoms.***  No new lumps or bumps.  *** ***She is taking iron  tablet daily. She has 50***% energy and 75***% appetite.  ***She endorses that she is maintaining a stable weight.  ASSESSMENT & PLAN:  1.  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   - No history of blood transfusion. - Intermittent IV iron , most recent Feraheme  on 08/03/2023 - Currently taking iron  supplement Nu-iron  once daily.  *** - Reports episode of dark stool in the summer 2022, and followed with GI.   - EGD (06/03/2021 by Dr. Shaaron): Normal esophagus, stomach, duodenum - Colonoscopy (04/27/2020 by Dr. Shaaron): Internal hemorrhoids and diverticulosis - Hemoccult stool x3 negative (December 2022) - Work-up of anemia (10/17/2021): Iron  saturation 15% but elevated ferritin 329 (high suspicion that elevated ferritin was acute phase reactant, as multiple inflammatory markers were also positive and patient has underlying RA).   Normal folate, copper , methylmalonic acid.  Elevated B12 at 2718 (likely acute phase reactant).   SPEP negative for M spike.  Immunofixation negative for monoclonal protein.  Mildly elevated kappa light chains 41.1/normal lambda 25.6, normal ratio 1.61. - No bright red blood per rectum or melena*** - Retacrit  initiated December 2024.  Current dose of Retacrit  10,000 units every 4 weeks. - Mild thrombocytopenia (platelets 126) noted on CBC/D from 06/16/2024.  Labs today show *** - Most recent labs (***): *** - DIFFERENTIAL DIAGNOSIS: Anemia related to CKD, chronic inflammation (rheumatoid arthritis) and medication (leflunomide ) - PLAN: *** TBD.  *** Continue CBC and Retacrit  10,000 units every 4 weeks. - RTC in 3 months with CBC/D, CMP, ferritin, iron /TIBC ***- Mild thrombocytopenia noted.  Will check B12, MMA, folate, and copper  with next labs.  (CT abdomen/pelvis from January 2024 showed normal liver and spleen.)***  2.  Leukocytosis, reactive - She has a longstanding history of intermittent leukocytosis since at least 2014, felt to be reactive leukocytosis - BCR/ABL by PCR testing was negative in January 2014 - JAK2 with reflex was negative - Inflammatory markers positive (10/17/2021): CRP 2.4, ESR 76, elevated B12 and ferritin - She had COVID-19 infection in November 2022. - She uses steroid inhaler (Trelegy) and intermittent Kenalog  cream. - She is a current non-smoker. - She has underlying rheumatoid arthritis.  She is on leflunomide , which is managed by Dr. Jon Jacob. - She denies any B symptoms such as fever, chills, night sweats, unintentional weight loss.  No new lumps or bumps.  - Differential diagnosis favors reactive leukocytosis in the setting of rheumatoid arthritis and chronic disease - PLAN: No further work-up or intervention at this time.  3.  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 Negative for appetite change, chills, diaphoresis, fever and unexpected weight change.  HENT:   Negative for lump/mass and nosebleeds.   Eyes:  Negative for eye problems.  Respiratory:  Negative for cough, hemoptysis and shortness of breath.   Cardiovascular:  Negative for chest pain, leg swelling and palpitations.  Gastrointestinal:  Negative for abdominal pain, blood in stool, constipation, diarrhea, nausea and vomiting.  Genitourinary:  Negative for hematuria.   Musculoskeletal:  Positive for arthralgias and back pain.  Skin: Negative.   Neurological:  Positive for headaches. Negative for dizziness and light-headedness.  Hematological:  Does not bruise/bleed easily.     PHYSICAL EXAM:***  ECOG PERFORMANCE STATUS: 2 - Symptomatic, <50% confined to bed  There were no vitals filed for this visit.  There were no vitals filed for this visit.  Physical Exam Constitutional:      Appearance: Normal appearance. She is normal weight.  Cardiovascular:     Heart sounds: Normal heart sounds.  Pulmonary:     Breath sounds: Normal breath sounds.  Neurological:     General: No focal deficit present.     Mental Status: Mental status is at baseline.  Psychiatric:        Behavior: Behavior normal. Behavior is cooperative.    PAST MEDICAL/SURGICAL HISTORY:  Past Medical History:  Diagnosis Date   AICD (automatic cardioverter/defibrillator) present    Anemia    anemia of chronic disease +/- IDA followed by Dr. Gatha. received iron  infusions in the past   Borderline glaucoma    CAD (coronary artery disease)    a. s/p CABG  1996. b. low risk nuc 2011. c. LHC 04/2016 due to drop in EF -> occluded native LAD,  widely patent sequential LIMA-D1-LAD.   Chronic kidney disease    mild stage of kidney disease   Chronic systolic CHF (congestive heart failure) (HCC)    Complication of anesthesia    DM2 (diabetes mellitus, type 2) (HCC)    not on any medicine for this at this time, 05/2016   Dyspnea    with exertion   GERD (gastroesophageal reflux disease)    Gout    History of blood transfusion    History of hiatal hernia    in 20s   History of pneumonia    March, 2016   HLD (hyperlipidemia)    HTN (hypertension)    Inappropriate sinus tachycardia    LBBB (left bundle branch block)    a. 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 Dx 04/2016 - EF 25-30%, diffuse HK, elevated LVEDP, mild MR, mod LAE.   Normocytic anemia 10/17/2021   NSVT (nonsustained ventricular tachycardia) (HCC)    PAT (paroxysmal atrial tachycardia)    Pneumonia  PONV (postoperative nausea and vomiting)    after just about every surgery I've had   Rheumatoid arthritis (HCC)    RA- hands   Seasonal allergies    Past Surgical History:  Procedure Laterality Date   ABDOMINAL HYSTERECTOMY     APPENDECTOMY     BACK SURGERY     BIOPSY  04/27/2020   Procedure: BIOPSY;  Surgeon: Shaaron Lamar HERO, MD;  Location: [facility] ENDO SU[facility];  Service: Endoscopy;;  ascending colon biopsy   BIV UPGRADE N/A 05/14/2024   Procedure: BIV UPGRADE;  Surgeon: Waddell Danelle ORN, MD;  Location: [facility] INVASIVE CV LAB;  Service: Cardiovascular;  Laterality: N/A;   CARDIAC CATHETERIZATION N/A 05/01/2016   Procedure: Right/Left Heart Cath and Coronary/Graft Angiography;  Surgeon: Alm ORN Clay, MD;  Location: [facility] INVASIVE CV LAB;  Service: Cardiovascular;  Laterality: N/A;   CHOLECYSTECTOMY     COLONOSCOPY  11/2011   Dr.  Magod:ext/int hemorrhoids, diverticulosis sigmoid colon and distal desc colon, mid-desc colon hyperplastic polyps.    COLONOSCOPY WITH PROPOFOL  N/A 04/27/2020   Rourk: Diverticulosis, hemorrhoids, normal terminal ileum.  Random colon biopsies significant for chronic lymphocytic colitis. No repeat due to age.   CORONARY ARTERY BYPASS GRAFT  1996   2 vessels   ESOPHAGOGASTRODUODENOSCOPY  11/2011   Dr. Rosalie: small hiatal hernia, one non-bleeding superficial gastric ulcer, medium-sized diverticulum in area of papilla   ESOPHAGOGASTRODUODENOSCOPY N/A 12/29/2015   Dr. Shaaron: Chronic inactive gastritis, normal esophagus status post dilation, duodenal diverticula   ESOPHAGOGASTRODUODENOSCOPY (EGD) WITH PROPOFOL  N/A 07/24/2019   Dr. Shaaron: Normal esophagus status post empiric dilation, small hiatal hernia, large D2 diverticulum   ESOPHAGOGASTRODUODENOSCOPY (EGD) WITH PROPOFOL  N/A 06/03/2021   Surgeon: Shaaron Lamar HERO, MD; normal esophagus, normal stomach, normal duodenum.  Abnormal hypopharyngeal mucosa bilaterally just distal to the base of the tongue (right side greater than left), overlying mucosa appeared irregular and somewhat verrucous in appearance.  Recommended ENT evaluation.   EYE SURGERY Bilateral    cataract removal   FOOT SURGERY     removal bone spur   FRACTURE SURGERY Right    arm   GIVENS CAPSULE STUDY  11/2012   Dr. Rosalie: minimal gastritis, normal small bowel capsule   HERNIA REPAIR     hiatal hernia   ICD IMPLANT N/A 02/04/2021   Procedure: ICD IMPLANT;  Surgeon: Waddell Danelle ORN, MD;  Location: [facility] INVASIVE CV LAB;  Service: Cardiovascular;  Laterality: N/A;   MALONEY DILATION N/A 07/24/2019   Procedure: AGAPITO DILATION;  Surgeon: Shaaron Lamar HERO, MD;  [facility];  Service: Endoscopy;  Laterality: N/A;   MALONEY DILATION N/A 06/03/2021   Procedure: AGAPITO DILATION;  Surgeon: Shaaron Lamar HERO, MD;  [facility]: [facility];  Service: Endoscopy;  Laterality: N/A;    MAXIMUM ACCESS (MAS)POSTERIOR LUMBAR INTERBODY FUSION (PLIF) 1 LEVEL N/A 08/14/2013   Procedure:  MAXIMUM ACCESS SURGERY(MAS) POSTERIOR LUMBAR INTERBODY FUSION LUMBAR THREE-FOUR ;  Surgeon: Alm GORMAN Molt, MD;  Location: [facility] NEURO ORS;  Service: Neurosurgery;  Laterality: N/A;   MAXIMUM ACCESS SURGERY(MAS) POSTERIOR LUMBAR INTERBODY FUSION LUMBAR THREE-FOUR    PTCA     RIGHT/LEFT HEART CATH AND CORONARY ANGIOGRAPHY N/A 03/26/2020   Procedure: RIGHT/LEFT HEART CATH AND CORONARY ANGIOGRAPHY;  Surgeon: Clay Alm ORN, MD;  Location: [facility] INVASIVE CV LAB;  Service: Cardiovascular;  Laterality: N/A;   SPINAL CORD STIMULATOR BATTERY EXCHANGE N/A 01/19/2021   Procedure: Spinal cord stimulator battery change;  Surgeon: Darlis Deatrice GORMAN, MD;  Location: [facility] OR;  Service: Neurosurgery;  Laterality: N/A;   SPINAL CORD STIMULATOR INSERTION N/A 06/16/2016   Procedure: LUMBAR SPINAL CORD STIMULATOR INSERTION;  Surgeon: Deward Fabian, MD;  Location: [facility] NEURO ORS;  Service: Neurosurgery;  Laterality: N/A;  LUMBAR SPINAL CORD STIMULATOR INSERTION   TEE WITHOUT CARDIOVERSION N/A 02/01/2023   Procedure: TRANSESOPHAGEAL ECHOCARDIOGRAM (TEE);  Surgeon: Debera Jayson MATSU, MD;  Location: [facility] ORS;  Service: Cardiovascular;  Laterality: N/A;   tens  05/2016   TONSILLECTOMY     TOTAL KNEE ARTHROPLASTY Right 06/30/2024   Procedure: ARTHROPLASTY, KNEE, TOTAL;  Surgeon: Yvone Rush, MD;  Location: [facility] ORS;  Service: Orthopedics;  Laterality: Right;  RIGHT TOTAL KNEE ARTHROPLASTY    SOCIAL HISTORY:  Social History   Socioeconomic History   Marital status: Married    Spouse name: Not on file   Number of children: 3   Years of education: Not on file   Highest education level: Not on file  Occupational History   Not on file  Tobacco Use   Smoking status: Former    Current packs/day: 0.00    Average packs/day: 0.8 packs/day for 15.0 years (11.3 ttl pk-yrs)    Types: Cigarettes    Start date: 11/21/1979    Quit date: 11/20/1994     Years since quitting: 29.8   Smokeless tobacco: Never   Tobacco comments:    Quit in 1996  Vaping Use   Vaping status: Never Used  Substance and Sexual Activity   Alcohol  use: Never    Alcohol /week: 0.0 standard drinks of alcohol    Drug use: Never   Sexual activity: Yes  Other Topics Concern   Not on file  Social History Narrative   2 living children, one deceased age 60   Right handed   One story home   Drinks coffee am   Social Drivers of Corporate investment banker Strain: Not on file  Food Insecurity: No Food Insecurity (06/30/2024)   Hunger Vital Sign    Worried About Running Out of Food in the Last Year: Never true    Ran Out of Food in the Last Year: Never true  Transportation Needs: No Transportation Needs (06/30/2024)   PRAPARE - Administrator, Civil Service (Medical): No    Lack of Transportation (Non-Medical): No  Physical Activity: Not on file  Stress: Not on file  Social Connections: Moderately Isolated (06/30/2024)   Social Connection and Isolation Panel    Frequency of Communication with Friends and Family: More than three times a week    Frequency of Social Gatherings with Friends and Family: More than three times a week    Attends Religious Services: Never    Database administrator or Organizations: No    Attends Banker Meetings: Never    Marital Status: Married  Catering manager Violence: Not At Risk (06/30/2024)   Humiliation, Afraid, Rape, and Kick questionnaire    Fear of Current or Ex-Partner: No    Emotionally Abused: No    Physically Abused: No    Sexually Abused: No    FAMILY HISTORY:  Family History  Problem Relation Age of Onset   Heart attack Father    Heart attack Mother    Bladder Cancer Brother    Cervical cancer Daughter        ?   Colon cancer Neg Hx     CURRENT MEDICATIONS:  Outpatient Encounter Medications as of 09/08/2024  Medication Sig   acetaminophen  (TYLENOL ) 500 MG tablet Take  1 tablet (500  mg total) by mouth every 6 (six) hours as needed.   albuterol  (VENTOLIN  HFA) 108 (90 Base) MCG/ACT inhaler Inhale 1-2 puffs into the lungs every 6 (six) hours as needed for wheezing or shortness of breath.   allopurinol  (ZYLOPRIM ) 300 MG tablet Take 300 mg by mouth daily.   aspirin  EC 325 MG tablet Take 1 tablet (325 mg total) by mouth 2 (two) times daily.   atorvastatin  (LIPITOR ) 40 MG tablet Take 1 tablet (40 mg total) by mouth daily.   budesonide -formoterol  (SYMBICORT ) 160-4.5 MCG/ACT inhaler Inhale 2 puffs into the lungs in the morning and at bedtime.   carvedilol  (COREG ) 6.25 MG tablet Take 1 tablet (6.25 mg total) by mouth 2 (two) times daily with a meal.   cholecalciferol (VITAMIN D3) 25 MCG (1000 UT) tablet Take 1,000 Units by mouth daily.   docusate sodium  (COLACE) 100 MG capsule Take 1 capsule (100 mg total) by mouth 2 (two) times daily.   etanercept (ENBREL SURECLICK) 50 MG/ML injection Inject 50 mg into the skin every Monday.   Evolocumab  (REPATHA  SURECLICK) 140 MG/ML SOAJ Inject 140 mg into the skin every 14 (fourteen) days.   FARXIGA  10 MG TABS tablet TAKE 1 TABLET(10 MG) BY MOUTH DAILY BEFORE BREAKFAST   ferrous sulfate 325 (65 FE) MG tablet Take 325 mg by mouth daily with breakfast.   fexofenadine (ALLEGRA) 180 MG tablet Take 180 mg by mouth daily.   fluticasone  (FLONASE ) 50 MCG/ACT nasal spray Place 1 spray into both nostrils daily.   furosemide  (LASIX ) 40 MG tablet Take 0.5 tablets (20 mg total) by mouth daily.   gabapentin  (NEURONTIN ) 300 MG capsule Take 300 mg by mouth 2 (two) times daily.   lansoprazole  (PREVACID ) 30 MG capsule Take 1 capsule (30 mg total) by mouth daily before breakfast. (Patient taking differently: Take 30 mg by mouth 2 (two) times daily before a meal.)   leflunomide  (ARAVA ) 10 MG tablet Take 10 mg by mouth daily.   losartan  (COZAAR ) 100 MG tablet Take 1 tablet (100 mg total) by mouth daily.   MAGNESIUM -OXIDE 400 (240 Mg) MG tablet TAKE 1 TABLET(400 MG) BY  MOUTH DAILY   meclizine  (ANTIVERT ) 12.5 MG tablet Take 12.5 mg by mouth 3 (three) times daily as needed for dizziness.   montelukast  (SINGULAIR ) 10 MG tablet Take 10 mg by mouth at bedtime.   Multiple Vitamins-Minerals (PRESERVISION AREDS 2) CAPS Take 1 capsule by mouth 2 (two) times daily.   nitroGLYCERIN  (NITROSTAT ) 0.4 MG SL tablet DISSOLVE 1 TABLET UNDER THE  TONGUE EVERY 5 MINUTES AS NEEDED FOR CHEST PAIN. MAX OF 3 TABLETS IN 15 MINUTES. CALL 911 IF PAIN  PERSISTS.   oxyCODONE  (ROXICODONE ) 5 MG immediate release tablet Take 1 tablet (5 mg total) by mouth every 4 (four) hours as needed for severe pain (pain score 7-10).   oxyCODONE -acetaminophen  (PERCOCET) 10-325 MG tablet Take 1 tablet by mouth in the morning, at noon, in the evening, and at bedtime.   sacubitril -valsartan  (ENTRESTO ) 24-26 MG Take 1 tablet by mouth 2 (two) times daily.   spironolactone  (ALDACTONE ) 25 MG tablet Take 0.5 tablets (12.5 mg total) by mouth daily.   tiZANidine  (ZANAFLEX ) 2 MG tablet Take 1 tablet (2 mg total) by mouth every 6 (six) hours as needed.   VASCEPA  1 g capsule TAKE 1 CAPSULE(1 GRAM) BY MOUTH TWICE DAILY   vitamin B-12 (CYANOCOBALAMIN ) 250 MCG tablet Take 250 mcg by mouth daily.   No facility-administered encounter medications on file as  of 09/08/2024.    ALLERGIES:  Allergies  Allergen Reactions   Biaxin [Clarithromycin] Rash and Other (See Comments)    Blisters in mouth    Penicillins Rash and Other (See Comments)    Blisters in mouth Has patient had a PCN reaction causing immediate rash, facial/tongue/throat swelling, SOB or lightheadedness with hypotension: Yesyes Has patient had a PCN reaction causing severe rash involving mucus membranes or skin necrosis: Nono Has patient had a PCN reaction that required hospitalization Nono Has patient had a PCN reaction occurring within the last 10 years: Nono If all of the above answers are NO, then may proceed   Hydroxychloroquine  Other (See Comments)    Sulfamethoxazole     Other Reaction(s): Other (See Comments)   Sulfasalazine Other (See Comments)   Losartan  Potassium     Other reaction(s): elevated creatinine    LABORATORY DATA:  I have reviewed the labs as listed.  CBC    Component Value Date/Time   WBC 5.8 08/27/2024 1615   RBC 3.38 (L) 08/27/2024 1615   HGB 11.1 (L) 08/27/2024 1615   HGB 10.8 (L) 11/29/2022 0931   HGB 12.4 11/01/2015 1134   HCT 35.9 (L) 08/27/2024 1615   HCT 33.2 (L) 11/29/2022 0931   HCT 37.4 11/01/2015 1134   PLT 222 08/27/2024 1615   PLT 229 11/29/2022 0931   MCV 106.2 (H) 08/27/2024 1615   MCV 97 11/29/2022 0931   MCV 91.7 11/01/2015 1134   MCH 32.8 08/27/2024 1615   MCHC 30.9 08/27/2024 1615   RDW 14.5 08/27/2024 1615   RDW 13.1 11/29/2022 0931   RDW 15.0 (H) 11/01/2015 1134   LYMPHSABS 2.9 07/24/2024 1633   LYMPHSABS 2.6 11/29/2022 0931   LYMPHSABS 2.9 11/01/2015 1134   MONOABS 0.7 07/24/2024 1633   MONOABS 0.7 11/01/2015 1134   EOSABS 0.3 07/24/2024 1633   EOSABS 0.3 11/29/2022 0931   BASOSABS 0.0 07/24/2024 1633   BASOSABS 0.0 11/29/2022 0931   BASOSABS 0.0 11/01/2015 1134      Latest Ref Rng & Units 08/27/2024    4:15 PM 07/24/2024    4:33 PM 07/08/2024    1:25 PM  CMP  Glucose 70 - 99 mg/dL 890  886  857   BUN 8 - 23 mg/dL 19  29  28    Creatinine 0.44 - 1.00 mg/dL 8.99  8.79  8.65   Sodium 135 - 145 mmol/L 140  137  139   Potassium 3.5 - 5.1 mmol/L 4.7  4.8  4.8   Chloride 98 - 111 mmol/L 104  103  99   CO2 22 - 32 mmol/L 24  24  28    Calcium  8.9 - 10.3 mg/dL 9.4  8.2  89.6     DIAGNOSTIC IMAGING:  I have independently reviewed the relevant imaging and discussed with the patient.   WRAP UP:  All questions were answered. The patient knows to call the clinic with any problems, questions or concerns.  Medical decision making: Moderate  Time spent on visit: I spent 20 minutes counseling the patient face to face. The total time spent in the appointment was 30 minutes and  more than 50% was on counseling.  Pleasant CHRISTELLA Barefoot, PA-C  ***

## 2024-09-08 ENCOUNTER — Inpatient Hospital Stay

## 2024-09-08 ENCOUNTER — Inpatient Hospital Stay: Attending: Physician Assistant | Admitting: Physician Assistant

## 2024-09-08 DIAGNOSIS — D631 Anemia in chronic kidney disease: Secondary | ICD-10-CM | POA: Insufficient documentation

## 2024-09-08 DIAGNOSIS — I5022 Chronic systolic (congestive) heart failure: Secondary | ICD-10-CM | POA: Diagnosis not present

## 2024-09-08 DIAGNOSIS — I130 Hypertensive heart and chronic kidney disease with heart failure and stage 1 through stage 4 chronic kidney disease, or unspecified chronic kidney disease: Secondary | ICD-10-CM | POA: Insufficient documentation

## 2024-09-08 DIAGNOSIS — N1831 Chronic kidney disease, stage 3a: Secondary | ICD-10-CM | POA: Diagnosis not present

## 2024-09-08 LAB — COMPREHENSIVE METABOLIC PANEL WITH GFR
ALT: 18 U/L (ref 0–44)
AST: 24 U/L (ref 15–41)
Albumin: 4 g/dL (ref 3.5–5.0)
Alkaline Phosphatase: 124 U/L (ref 38–126)
Anion gap: 11 (ref 5–15)
BUN: 25 mg/dL — ABNORMAL HIGH (ref 8–23)
CO2: 28 mmol/L (ref 22–32)
Calcium: 9.5 mg/dL (ref 8.9–10.3)
Chloride: 102 mmol/L (ref 98–111)
Creatinine, Ser: 1.1 mg/dL — ABNORMAL HIGH (ref 0.44–1.00)
GFR, Estimated: 52 mL/min — ABNORMAL LOW (ref >60)
Glucose, Bld: 118 mg/dL — ABNORMAL HIGH (ref 70–99)
Potassium: 4.8 mmol/L (ref 3.5–5.1)
Sodium: 141 mmol/L (ref 135–145)
Total Bilirubin: 0.2 mg/dL (ref 0.0–1.2)
Total Protein: 7.1 g/dL (ref 6.5–8.1)

## 2024-09-08 LAB — IRON AND TIBC
Iron: 61 ug/dL (ref 28–170)
Saturation Ratios: 18 % (ref 10.4–31.8)
TIBC: 340 ug/dL (ref 250–450)
UIBC: 279 ug/dL

## 2024-09-08 LAB — CBC WITH DIFFERENTIAL/PLATELET
Abs Immature Granulocytes: 0.02 K/uL (ref 0.00–0.07)
Basophils Absolute: 0 K/uL (ref 0.0–0.1)
Basophils Relative: 1 %
Eosinophils Absolute: 0.3 K/uL (ref 0.0–0.5)
Eosinophils Relative: 4 %
HCT: 37.1 % (ref 36.0–46.0)
Hemoglobin: 11.6 g/dL — ABNORMAL LOW (ref 12.0–15.0)
Immature Granulocytes: 0 %
Lymphocytes Relative: 27 %
Lymphs Abs: 2 K/uL (ref 0.7–4.0)
MCH: 32.2 pg (ref 26.0–34.0)
MCHC: 31.3 g/dL (ref 30.0–36.0)
MCV: 103.1 fL — ABNORMAL HIGH (ref 80.0–100.0)
Monocytes Absolute: 0.8 K/uL (ref 0.1–1.0)
Monocytes Relative: 11 %
Neutro Abs: 4.4 K/uL (ref 1.7–7.7)
Neutrophils Relative %: 57 %
Platelets: 209 K/uL (ref 150–400)
RBC: 3.6 MIL/uL — ABNORMAL LOW (ref 3.87–5.11)
RDW: 13.6 % (ref 11.5–15.5)
WBC: 7.5 K/uL (ref 4.0–10.5)
nRBC: 0 % (ref 0.0–0.2)

## 2024-09-08 LAB — FERRITIN: Ferritin: 518 ng/mL — ABNORMAL HIGH (ref 11–307)

## 2024-09-08 NOTE — Progress Notes (Signed)
 Patient's Hgb 11.6. Per parameters patient does not need Retacrit  injection today. All follow ups as scheduled.   Marjorie Lussier

## 2024-09-08 NOTE — Patient Instructions (Signed)
 [facility] at [facility] **VISIT SUMMARY & IMPORTANT INSTRUCTIONS **   You were seen today by Pleasant Barefoot PA-C for your anemia.  Your anemia is related to your chronic kidney disease and chronic inflammation.  You will continue to receive Retacrit  injections once every 4 weeks to help your body make healthy blood cells.   You can DECREASE your iron  pill to take EVERY OTHER DAY.  FOLLOW-UP APPOINTMENT: 3 months  ** Thank you for trusting me with your healthcare!  I strive to provide all of my patients with quality care at each visit.  If you receive a survey for this visit, I would be so grateful to you for taking the time to provide feedback.  Thank you in advance!  ~ Skylan Gift                                        Dr. Mickiel Davonna Pleasant Barefoot, PA-C     Delon Hope, NP   - - - - - - - - - - - - - - - - - -     Thank you for choosing [facility] at [facility] to provide your oncology and hematology care.  To afford each patient quality time with our provider, please arrive at least 15 minutes before your scheduled appointment time.   If you have a lab appointment with the Cancer Center please come in thru the Main Entrance and check in at the main information desk.  You need to re-schedule your appointment should you arrive 10 or more minutes late.  We strive to give you quality time with our providers, and arriving late affects you and other patients whose appointments are after yours.  Also, if you no show three or more times for appointments you may be dismissed from the clinic at the providers discretion.     Again, thank you for choosing [facility].  Our hope is that these requests will decrease the amount of time that you wait before being seen by our physicians.       _____________________________________________________________  Should you have questions after your visit to [facility], please contact our office at [phone] and follow the prompts.  Our office hours are 8:00 a.m. and 4:30 p.m. Monday - Friday.  Please note that voicemails left after 4:00 p.m. may not be returned until the following business day.  We are closed weekends and major holidays.  You do have access to a nurse 24-7, just call the main number to the clinic [phone] and do not press any options, hold on the line and a nurse will answer the phone.    For prescription refill requests, have your pharmacy contact our office and allow 72 hours.

## 2024-09-10 ENCOUNTER — Encounter (HOSPITAL_COMMUNITY)

## 2024-09-10 ENCOUNTER — Telehealth (HOSPITAL_COMMUNITY): Payer: Self-pay

## 2024-09-10 NOTE — Telephone Encounter (Signed)
 No show #1, called and spoke to pt who stated her husband had arrived home and has a memorial on Friday, unable to make to next apt. Transfered to front desk to schedule next apt.   Augustin Mclean, LPTA/CLT; WILLAIM [phone]

## 2024-09-11 ENCOUNTER — Other Ambulatory Visit (HOSPITAL_COMMUNITY): Payer: Self-pay | Admitting: Family Medicine

## 2024-09-11 DIAGNOSIS — Z23 Encounter for immunization: Secondary | ICD-10-CM | POA: Diagnosis not present

## 2024-09-11 DIAGNOSIS — N183 Chronic kidney disease, stage 3 unspecified: Secondary | ICD-10-CM | POA: Diagnosis not present

## 2024-09-11 DIAGNOSIS — M069 Rheumatoid arthritis, unspecified: Secondary | ICD-10-CM | POA: Diagnosis not present

## 2024-09-11 DIAGNOSIS — I700 Atherosclerosis of aorta: Secondary | ICD-10-CM | POA: Diagnosis not present

## 2024-09-11 DIAGNOSIS — R911 Solitary pulmonary nodule: Secondary | ICD-10-CM | POA: Diagnosis not present

## 2024-09-11 DIAGNOSIS — E1121 Type 2 diabetes mellitus with diabetic nephropathy: Secondary | ICD-10-CM | POA: Diagnosis not present

## 2024-09-11 DIAGNOSIS — M8580 Other specified disorders of bone density and structure, unspecified site: Secondary | ICD-10-CM

## 2024-09-11 DIAGNOSIS — I5022 Chronic systolic (congestive) heart failure: Secondary | ICD-10-CM | POA: Diagnosis not present

## 2024-09-11 DIAGNOSIS — J449 Chronic obstructive pulmonary disease, unspecified: Secondary | ICD-10-CM | POA: Diagnosis not present

## 2024-09-11 DIAGNOSIS — G894 Chronic pain syndrome: Secondary | ICD-10-CM | POA: Diagnosis not present

## 2024-09-11 DIAGNOSIS — Z0000 Encounter for general adult medical examination without abnormal findings: Secondary | ICD-10-CM | POA: Diagnosis not present

## 2024-09-11 DIAGNOSIS — I2510 Atherosclerotic heart disease of native coronary artery without angina pectoris: Secondary | ICD-10-CM | POA: Diagnosis not present

## 2024-09-12 ENCOUNTER — Encounter (HOSPITAL_COMMUNITY)

## 2024-09-15 ENCOUNTER — Ambulatory Visit (HOSPITAL_COMMUNITY)

## 2024-09-15 ENCOUNTER — Encounter (HOSPITAL_COMMUNITY): Payer: Self-pay

## 2024-09-15 DIAGNOSIS — M25561 Pain in right knee: Secondary | ICD-10-CM

## 2024-09-15 DIAGNOSIS — Z7409 Other reduced mobility: Secondary | ICD-10-CM

## 2024-09-15 NOTE — Therapy (Signed)
 OUTPATIENT PHYSICAL THERAPY LOWER EXTREMITY TREATMENT   Patient Name: Katherine Larson MRN: [id number] DOB:Mar 26, 1946, 78 y.o., female Today's Date: 09/15/2024  END OF SESSION:  PT End of Session - 09/15/24 1330     Visit Number 12    Number of Visits 12    Date for Recertification  09/12/24    Authorization Type [employer] MEDICARE    Authorization Time Period uhc covered 8visits from 08/27/24-09/24/24    Authorization - Number of Visits 10    Progress Note Due on Visit 10    PT Start Time 1330    PT Stop Time 1406    PT Time Calculation (min) 36 min    Equipment Utilized During Treatment --    Activity Tolerance Patient tolerated treatment well    Behavior During Therapy WFL for tasks assessed/performed              Past Medical History:  Diagnosis Date   AICD (automatic cardioverter/defibrillator) present    Anemia    anemia of chronic disease +/- IDA followed by Dr. Gatha. received iron  infusions in the past   Borderline glaucoma    CAD (coronary artery disease)    a. s/p CABG 1996. b. low risk nuc 2011. c. LHC 04/2016 due to drop in EF -> occluded native LAD,  widely patent sequential LIMA-D1-LAD.   Chronic kidney disease    mild stage of kidney disease   Chronic systolic CHF (congestive heart failure) (HCC)    Complication of anesthesia    DM2 (diabetes mellitus, type 2) (HCC)    not on any medicine for this at this time, 05/2016   Dyspnea    with exertion   GERD (gastroesophageal reflux disease)    Gout    History of blood transfusion    History of hiatal hernia    in 20s   History of pneumonia    March, 2016   HLD (hyperlipidemia)    HTN (hypertension)    Inappropriate sinus tachycardia    LBBB (left bundle branch block)    a. 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 Dx 04/2016 - EF 25-30%, diffuse  HK, elevated LVEDP, mild MR, mod LAE.   Normocytic anemia 10/17/2021   NSVT (nonsustained ventricular tachycardia) (HCC)    PAT (paroxysmal atrial tachycardia)    Pneumonia    PONV (postoperative nausea and vomiting)    after just about every surgery I've had   Rheumatoid arthritis (HCC)    RA- hands   Seasonal allergies    Past Surgical History:  Procedure Laterality Date   ABDOMINAL HYSTERECTOMY     APPENDECTOMY     BACK SURGERY     BIOPSY  04/27/2020   Procedure: BIOPSY;  Surgeon: Shaaron Lamar HERO, MD;  Location: [facility];  Service: Endoscopy;;  ascending colon biopsy   BIV UPGRADE N/A 05/14/2024   Procedure: BIV UPGRADE;  Surgeon: Waddell Danelle ORN, MD;  Location: [facility] INVASIVE CV LAB;  Service: Cardiovascular;  Laterality: N/A;   CARDIAC CATHETERIZATION N/A 05/01/2016   Procedure: Right/Left Heart Cath and Coronary/Graft Angiography;  Surgeon: Alm ORN Clay, MD;  Location: [facility] INVASIVE CV LAB;  Service: Cardiovascular;  Laterality: N/A;   CHOLECYSTECTOMY     COLONOSCOPY  11/2011   Dr. Magod:ext/int hemorrhoids, diverticulosis sigmoid colon  and distal desc colon, mid-desc colon hyperplastic polyps.    COLONOSCOPY WITH PROPOFOL  N/A 04/27/2020   Rourk: Diverticulosis, hemorrhoids, normal terminal ileum.  Random colon biopsies significant for chronic lymphocytic colitis. No repeat due to age.   CORONARY ARTERY BYPASS GRAFT  1996   2 vessels   ESOPHAGOGASTRODUODENOSCOPY  11/2011   Dr. Rosalie: small hiatal hernia, one non-bleeding superficial gastric ulcer, medium-sized diverticulum in area of papilla   ESOPHAGOGASTRODUODENOSCOPY N/A 12/29/2015   Dr. Shaaron: Chronic inactive gastritis, normal esophagus status post dilation, duodenal diverticula   ESOPHAGOGASTRODUODENOSCOPY (EGD) WITH PROPOFOL  N/A 07/24/2019   Dr. Shaaron: Normal esophagus status post empiric dilation, small hiatal hernia, large D2 diverticulum   ESOPHAGOGASTRODUODENOSCOPY (EGD) WITH PROPOFOL  N/A 06/03/2021   Surgeon:  Shaaron Lamar HERO, MD; normal esophagus, normal stomach, normal duodenum.  Abnormal hypopharyngeal mucosa bilaterally just distal to the base of the tongue (right side greater than left), overlying mucosa appeared irregular and somewhat verrucous in appearance.  Recommended ENT evaluation.   EYE SURGERY Bilateral    cataract removal   FOOT SURGERY     removal bone spur   FRACTURE SURGERY Right    arm   GIVENS CAPSULE STUDY  11/2012   Dr. Rosalie: minimal gastritis, normal small bowel capsule   HERNIA REPAIR     hiatal hernia   ICD IMPLANT N/A 02/04/2021   Procedure: ICD IMPLANT;  Surgeon: Waddell Danelle ORN, MD;  Location: [facility] INVASIVE CV LAB;  Service: Cardiovascular;  Laterality: N/A;   MALONEY DILATION N/A 07/24/2019   Procedure: AGAPITO DILATION;  Surgeon: Shaaron Lamar HERO, MD;  Location: [facility];  Service: Endoscopy;  Laterality: N/A;   MALONEY DILATION N/A 06/03/2021   Procedure: AGAPITO DILATION;  Surgeon: Shaaron Lamar HERO, MD;  Location: [facility];  Service: Endoscopy;  Laterality: N/A;   MAXIMUM ACCESS (MAS)POSTERIOR LUMBAR INTERBODY FUSION (PLIF) 1 LEVEL N/A 08/14/2013   Procedure:  MAXIMUM ACCESS SURGERY(MAS) POSTERIOR LUMBAR INTERBODY FUSION LUMBAR THREE-FOUR ;  Surgeon: Alm GORMAN Molt, MD;  Location: [facility] NEURO ORS;  Service: Neurosurgery;  Laterality: N/A;   MAXIMUM ACCESS SURGERY(MAS) POSTERIOR LUMBAR INTERBODY FUSION LUMBAR THREE-FOUR    PTCA     RIGHT/LEFT HEART CATH AND CORONARY ANGIOGRAPHY N/A 03/26/2020   Procedure: RIGHT/LEFT HEART CATH AND CORONARY ANGIOGRAPHY;  Surgeon: Anner Alm ORN, MD;  Location: [facility] INVASIVE CV LAB;  Service: Cardiovascular;  Laterality: N/A;   SPINAL CORD STIMULATOR BATTERY EXCHANGE N/A 01/19/2021   Procedure: Spinal cord stimulator battery change;  Surgeon: Darlis Deatrice GORMAN, MD;  Location: [facility] OR;  Service: Neurosurgery;  Laterality: N/A;   SPINAL CORD STIMULATOR INSERTION N/A 06/16/2016   Procedure: LUMBAR SPINAL CORD STIMULATOR INSERTION;   Surgeon: Deward Fabian, MD;  Location: [facility] NEURO OR[facility];  Service: Neurosurgery;  Laterality: N/A;  LUMBAR SPINAL CORD STIMULATOR INSERTION   TEE WITHOUT CARDIOVERSION N/A 02/01/2023   Procedure: TRANSESOPHAGEAL ECHOCARDIOGRAM (TEE);  Surgeon: Debera Jayson MATSU, MD;  Location: [facility] ORS;  Service: Cardiovascular;  Laterality: N/A;   tens  05/2016   TONSILLECTOMY     TOTAL KNEE ARTHROPLASTY Right 06/30/2024   Procedure: ARTHROPLASTY, KNEE, TOTAL;  Surgeon: Yvone Rush, MD;  Location: [facility] ORS;  Service: Orthopedics;  Laterality: Right;  RIGHT TOTAL KNEE ARTHROPLASTY   Patient Active Problem List   Diagnosis Date Noted   Arthritis of right knee 06/30/2024   Primary osteoarthritis of right knee 06/27/2024   Bacteremia 02/01/2023   Pseudomonas sepsis (HCC) 01/30/2023   Acute respiratory failure with hypoxia (HCC) 01/28/2023  Multifocal pneumonia 01/28/2023   Melena 11/28/2022   Rectal bleeding 11/28/2022   Upper abdominal pain 11/28/2022   Early satiety 07/27/2022   Nausea without vomiting 07/27/2022   Hypotension 02/10/2022   Normocytic anemia 10/17/2021   Lymphocytic colitis 07/02/2020   Coronary artery disease, occlusive: CTO proxLAD 03/26/2020   Abnormal nuclear stress test 03/26/2020   Chronic diarrhea 03/02/2020   NICM- new drop in EF 25-30% 05/03/2016   Acute pulmonary edema (HCC)    LBBB (left bundle branch block) 04/29/2016   Chronic HFrEF (heart failure with reduced ejection fraction) (HCC) 04/29/2016   Abdominal pain, chronic, epigastric 03/07/2016   Septic shock (HCC)    COPD exacerbation (HCC) 01/21/2016   Hyperthyroidism 01/21/2016   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   Hypokalemia 01/19/2016   Hyponatremia 01/19/2016   Diet-controlled diabetes mellitus (HCC) 01/19/2016   CAP (community acquired pneumonia) 01/18/2016   Mucosal abnormality of stomach    Dysphagia    Constipation 12/08/2015   Gastroesophageal reflux disease 12/08/2015   Abnormal CT scan, esophagus 12/08/2015    Esophageal dysphagia 12/08/2015   Abdominal pain, epigastric 12/08/2015   Loss of weight 12/08/2015   Lower abdominal pain 12/08/2015   Back pain 08/26/2015   Headache 08/26/2015   Essential hypertension 08/26/2015   DM type 2 (diabetes mellitus, type 2) (HCC) 08/26/2015   Sinus tachycardia 06/07/2015   Diarrhea 06/07/2015   Preop cardiovascular exam 07/16/2013   Atrial flutter (HCC) 07/16/2013   Anemia 01/08/2013   Leukocytosis 12/17/2012   DYSPNEA 02/02/2010   DIABETES MELLITUS 07/28/2009   Hyperlipidemia 07/28/2009   CHOLECYSTECTOMY, HX OF 07/28/2009   Hx of CABG '96- cath 05/01/16 07/28/2009   HERNIORRHAPHY, HX OF 07/28/2009    PCP: Teresa Channel, MD   REFERRING PROVIDER: Yvone Rush, MD  REFERRING DIAG: s/p rt total knee replacement  THERAPY DIAG:  Right knee pain, unspecified chronicity  Impaired functional mobility, balance, gait, and endurance  Rationale for Evaluation and Treatment: Rehabilitation  ONSET DATE: 06/30/24 RTKA  SUBJECTIVE:   SUBJECTIVE STATEMENT: Patient reports that her thigh is hurting quite a bit today. She has tried walking without the walker, but she still wants to walk sideways. She feels that she is a lot better since starting physical therapy.   Eval:  Pt states she had the surgery the 11th. Pt states she had home health PT about 6 times and had some issues with the oxygen . Previously she jut wore oxygen  at night. Pt has many ongoing health issues and has been struggling with symptoms of UTI and diarrhea the past few days.   PERTINENT HISTORY: -Been using husbands supplemental O2 since home therapy, had just been using it at night before the surgery PAIN:  Are you having pain? Yes: NPRS scale: 7/10 Pain location: right knee Pain description: ache Aggravating factors: being up on it Relieving factors: rest elevated  PRECAUTIONS: Fall  RED FLAGS: UTI symptoms during urination   WEIGHT BEARING RESTRICTIONS: WBAT  FALLS:  Has  patient fallen in last 6 months? No  LIVING ENVIRONMENT: Lives with: lives with their spouse Lives in: House/apartment Stairs: No Has following equipment at home: Environmental Consultant - 2 wheeled  OCCUPATION: retired, biomedical engineer  PLOF: Requires assistive device for independence, Needs assistance with ADLs, and Needs assistance with gait  PATIENT GOALS: walk faster, decrease the knee pain, improve balance, increase LE strength  NEXT MD VISIT: Next Tuesday for Oxygen    OBJECTIVE:  Note: Objective measures were completed at Evaluation unless otherwise noted.  DIAGNOSTIC FINDINGS:  none  PATIENT SURVEYS:  LEFS : 35/80 08/27/24:   LEFS  Extreme difficulty/unable (0), Quite a bit of difficulty (1), Moderate difficulty (2), Little difficulty (3), No difficulty (4) Survey date:   08/27/24: 09/15/24  Any of your usual work, housework or school activities 3 4  2. Usual hobbies, recreational or sporting activities 4 4  3. Getting into/out of the bath 4 4  4. Walking between rooms 4 4  5. Putting on socks/shoes 4 4  6. Squatting  1 4  7. Lifting an object, like a bag of groceries from the floor 4 4  8. Performing light activities around your home 4 4  9. Performing heavy activities around your home 3 0  10. Getting into/out of a car 1 4  11. Walking 2 blocks 4 0  12. Walking 1 mile 0 0  13. Going up/down 10 stairs (1 flight) 1 4  14. Standing for 1 hour 1 4  15.  sitting for 1 hour 4 4  16. Running on even ground 0 0  17. Running on uneven ground 0 0  18. Making sharp turns while running fast 0 0  19. Hopping  0 0  20. Rolling over in bed 4 4  Score total:  46/80 52/80      COGNITION: Overall cognitive status: Within functional limits for tasks assessed     SENSATION: Light touch: Impaired numbness around incision  EDEMA:  RLE knee and ankle  PALPATION: Tenderness to palpation at posterior knee and incision area.  LOWER EXTREMITY ROM:  Active ROM Right eval Left eval  Right 07/29/24 Right 08/21/24 Left  Hip flexion       Hip extension       Hip abduction       Hip adduction       Hip internal rotation       Hip external rotation       Knee flexion 121  130 WFL   Knee extension 3 from neutral  3 8 from neutral 4 from neutral  Ankle dorsiflexion       Ankle plantarflexion       Ankle inversion       Ankle eversion        (Blank rows = not tested)  LOWER EXTREMITY MMT:  MMT Right 07/29/24 Left 07/29/24 Right 08/21/24 Left 08/21/24 Right 09/15/24  Hip flexion 3+ 3/5 3+ 4- 3+/5  Hip extension Cannot tolerate prone, complete sidelying next session. Cannot tolerate prone, complete sidelying next session. 4-, seated  4+, seated   Hip abduction 3/5 3/5 4- 4-   Hip adduction   3+ 3+ 4/5  Hip internal rotation       Hip external rotation       Knee flexion   4+ 4+ 4+/5  Knee extension   3+, slight pain 4+ 4+/  Ankle dorsiflexion       Ankle plantarflexion       Ankle inversion       Ankle eversion        (Blank rows = not tested)    FUNCTIONAL TESTS:  5 times sit to stand: 16.48 seconds, 94% O2, with [place] 2 minute walk test: 185 feet, 99% O2 with [place] 08/21/24: 5TSTS, 12.27 seconds, BUE support on RA , 271 feet 08/27/24: FGA: 16 / 30 = 53.3 %  GAIT: Distance walked: 200 feet Assistive device utilized: Walker - 2 wheeled Level of assistance: Modified independence Comments: Pt requires therapist assist to carry  supplemental O2 supply, decreased gait speed noted, decreased visual acuity noted, and slight antalgic pattern noted with decreased stance time on RLE although minimal.                                                                                                                                 TREATMENT DATE:                                    09/15/24 EXERCISE LOG  Exercise Repetitions and Resistance Comments  Bike  L4 x 4 minutes    Sit to stand 5 reps    Side stepping  5 laps in parallel bars    Marching  2 minutes     Reviewed HEP      Blank cell = exercise not performed today   09/03/2024  Therapeutic Exercise: -Stationary bike, 5 minutes, pt able to power up bike immediately, pt cued for increased pace, level 4 resistance -Leg Press, 1 sets of 10 reps, 4 plates, pt cued for increased eccentric control -Long arc quads, 1 set of 8 reps, 5lb ankle weights, pt cued for increased ROM and increased eccentric control Therapeutic Activity: -Walking marches and butt kicks, 2 laps, 50 foot laps, pt cued for increased LE ROM, 5lb weight -Sled pushes/pulls, 2 laps, 40 feet per lap, 30lb on sled, pt cued for consistent movement of sled especially during pull movement -Stair ambulation, 2 bouts on 3 and 7 inch steps, in clinic, pt cued for decreased speed and controlled lowering on 7 inch steps  09/01/24 Discussion of multiple issues going on on her life and the caregivers available Standing  Heel raises on incline 2 finger hold 2 x 10 Toe raises on decline 2 finger hold 2 x 10 Tandem stance x 30 each intermitent use of Ue's Hip abduction 2# 2 x 10 Hip extension 2# 2 x 10 Squats 2 x 10 Seated  LAQ's 2# 2 x 10 with 2 each     08/27/24 Nustep UE/LE x 8' seat 8 for extension LEFS questioned during Nustep: 46/80= 57.5% FGA: 16 / 30 = 53.3 %  Mild impairment -- gait level surface (2) Moderate impairment -- change in gait speed (1);  Mild impairment -- gait with horizontal head turns (2);  Mild impairment -- gait with vertical head turns (2);  Mild impairment -- gait and pivot turn (2);  Moderate impairment -- step over obstacle (1);  Mild impairment -- gait with eyes closed (2);  Mild impairment -- ambulating backwards (2);  Mild impairment -- up and down steps (2); 6in step up and over 2x 10 Squat front of chair 2x 10 cueing for mechanics    PATIENT EDUCATION:  Education details: Pt was educated on progress toward her goals, healing, and HEP  Person educated: Patient Education method:  Explanation, Verbal cues, and Handouts Education comprehension:  verbalized understanding and returned demonstration  HOME EXERCISE PROGRAM: Access Code: [email or web address] URL: [email or web address] Date: 09/15/2024 Prepared by: Lacinda Fass  Exercises - Sit to Stand  - 1 x daily - 7 x weekly - 3 sets - 10 reps - Squat with Counter Support  - 1 x daily - 7 x weekly - 3 sets - 10 reps - Side Stepping with Counter Support  - 1 x daily - 7 x weekly - 3 sets - 10 reps - Standing March with Counter Support  - 1 x daily - 7 x weekly - 3 sets - 10 reps - Walking with Counter Support  - 1 x daily - 7 x weekly - 3 sets - 10 reps - Heel Raises with Counter Support  - 1 x daily - 7 x weekly - 3 sets - 10 reps - Standing Tandem Balance with Counter Support  - 1 x daily - 7 x weekly - 3 sets - 10 reps  Access Code: [email or web address] URL: [email or web address] Date: 07/17/2024 Prepared by: Lang Ada  Exercises - Seated Knee Extension AROM  - 1 x daily - 7 x weekly - 3 sets - 10 reps - Sit to Stand with Armchair  - 1 x daily - 7 x weekly - 3 sets - 10 reps - Heel Raises with Counter Support  - 1 x daily - 7 x weekly - 3 sets - 10 reps  07/29/24: Bridge 10x 5  ASSESSMENT:  CLINICAL IMPRESSION: Patient has made good progress with skilled physical therapy as evidenced by her subjective reports, objective measures, LEFS score, and progress toward her goals. She was able to meet all of her short term and was able to meet or nearly meet all of her long term goals. She was provided an updated HEP and she was able to properly demonstrate these interventions. She reported feeling comfortable being discharged with her updated HEP at this time.  Eval:  Patient is a 78 y.o. female who was seen today for physical therapy evaluation and treatment for s/p rt total knee replacement. Patient demonstrates decreased RLE strength, abnormal pain rating, decreased endurance, and impaired balance. Pt  reports having symptoms of UTI and drop in O2 saturation into the 30s during home health PT session and has been using spouses old supplemental O2 tank for community ambulation. O2 levels stable throughout session today with supplemental O2, pt states she has appontment with primary care next Tuesday to address abnormal O2 levels. Patient also demonstrates difficulty with ambulation during today's session with need for therapist assist for supplemental O2 tank carrry and RW for balance, decreased stride length and velocity noted. Patient also demonstrates 121 degrees of knee flexion with no signs of pain and 3 degrees from neutral extension. Pt demonstrates tenderness to palpation to knee cap although mobility WFL, and posterior knee. Pt unable to stand without UE support for assist this date. Patient requires education on importance of HEP compliance, DVT, prognosis and role of PT. Patient would benefit from skilled physical therapy for decreased R knee pain, increased endurance with ambulation, increased RLE strength, and balance for improved gait quality, return to higher level of function with ADLs, and progress towards therapy goals.  OBJECTIVE IMPAIRMENTS: Abnormal gait, decreased activity tolerance, decreased balance, decreased endurance, decreased knowledge of use of DME, decreased mobility, difficulty walking, decreased ROM, decreased strength, impaired sensation, and pain.   ACTIVITY LIMITATIONS: carrying, lifting, bending, standing, squatting, sleeping, stairs, transfers, and bed mobility  PARTICIPATION LIMITATIONS: meal prep, laundry, interpersonal relationship,  shopping, community activity, and yard work  PERSONAL FACTORS: Age, Fitness, Past/current experiences, and Time since onset of injury/illness/exacerbation are also affecting patient's functional outcome.   REHAB POTENTIAL: Good  CLINICAL DECISION MAKING: Stable/uncomplicated  EVALUATION COMPLEXITY: Low   GOALS: Goals reviewed  with patient? Yes  SHORT TERM GOALS: Target date: 08/07/24  Patient will demonstrate evidence of independence with individualized HEP and will report compliance for at least 3 days per week for optimized progression towards remaining therapy goals. Baseline: 08/21/24, pt states she's been too busy with caring for husband Goal status: MET  2.  Patient will report a decrease in pain level during community ambulation by at least 2 points for improved quality of life. Baseline: 3/10 Goal status: MET     LONG TERM GOALS: Target date: 08/28/24  Pt will demonstrate a an increase of at least 9 points on the LEFS for improved performance of community ambulation and ADL. Baseline: 8 point improvement Goal status: NEARLY MET   2.  Pt will improve 2 MWT by 50 feet in order to demonstrate improved functional ambulatory capacity in community setting.  Baseline: see objective Goal status: MET  3.  Pt will demonstrate WFL ROM (flexion and extension) in right knee, for increased mobility and maximal efficiency of gait cycle during ambulation. Baseline: see objective Goal status: MET  4.  Pt will demonstrate at least 4-/5 MMT for right lower extremity for increased strength during ADL and community ambulation. Baseline: see objective Goal status: NEARLY MET   5.  Pt will improve 5TSTS by at least 2.3 seconds in order to improve strength during functional activities.. Baseline: see objective Goal status: MET  6. Pt will demonstrate the ability to score at least a 22/30 on FGA to demonstrate decreased fall risk during ambulation and functional mobility. Baseline: see objective; 08/27/24: FGA 16/30 Goal status: Added 07/30/24  PLAN:  PT FREQUENCY: 1-2x/week  PT DURATION: 3 weeks  PLANNED INTERVENTIONS: 97110-Therapeutic exercises, 97530- Therapeutic activity, 97112- Neuromuscular re-education, 97535- Self Care, [id number]- Manual therapy, [id number]- Gait training, Patient/Family education, Balance  training, Stair training, Joint mobilization, DME instructions, Cryotherapy, and Moist heat  PLAN FOR NEXT SESSION: Update HEP and discharge next session.  Lacinda Fass, PT, DPT  2:13 PM, 09/15/24

## 2024-09-17 DIAGNOSIS — Z9689 Presence of other specified functional implants: Secondary | ICD-10-CM | POA: Diagnosis not present

## 2024-09-17 DIAGNOSIS — M47812 Spondylosis without myelopathy or radiculopathy, cervical region: Secondary | ICD-10-CM | POA: Diagnosis not present

## 2024-09-17 DIAGNOSIS — M961 Postlaminectomy syndrome, not elsewhere classified: Secondary | ICD-10-CM | POA: Diagnosis not present

## 2024-09-19 ENCOUNTER — Other Ambulatory Visit (HOSPITAL_COMMUNITY)

## 2024-09-22 ENCOUNTER — Ambulatory Visit (HOSPITAL_COMMUNITY)
Admission: RE | Admit: 2024-09-22 | Discharge: 2024-09-22 | Disposition: A | Discharge status: Home / Self Care | Source: Ambulatory Visit | Attending: Family Medicine | Admitting: Family Medicine

## 2024-09-22 ENCOUNTER — Ambulatory Visit: Attending: Internal Medicine

## 2024-09-22 DIAGNOSIS — M8589 Other specified disorders of bone density and structure, multiple sites: Secondary | ICD-10-CM | POA: Insufficient documentation

## 2024-09-22 DIAGNOSIS — Z95810 Presence of automatic (implantable) cardiac defibrillator: Secondary | ICD-10-CM

## 2024-09-22 DIAGNOSIS — I5022 Chronic systolic (congestive) heart failure: Secondary | ICD-10-CM | POA: Diagnosis not present

## 2024-09-22 DIAGNOSIS — M8580 Other specified disorders of bone density and structure, unspecified site: Secondary | ICD-10-CM | POA: Diagnosis present

## 2024-09-24 NOTE — Progress Notes (Signed)
 EPIC Encounter for ICM Monitoring  Patient Name: Katherine Larson is a 78 y.o. female Date: 09/24/2024 Primary Care Physican: Teresa Channel, MD Primary Cardiologist: Rolan Electrophysiologist: Waddell 01/25/2024 Office Weight: 126 lbs     04/17/2024 Office Weight: 128 lbs    06/24/2024 Weight: 128 lbs (125 lbs baseline)        07/07/2024 Weight: 128 lbs (125 lbs on 8/17) 09/24/2024 Weight: 120 lbs                                          Spoke with patient and heart failure questions reviewed.  Transmission results reviewed.  Pt asymptomatic for fluid accumulation.     Since 08/18/2024 ICM Remote Transmission:  CorVue thoracic impedance suggesting normal fluid levels.    Prescribed:  Furosemide  40 mg take 0.5 tablet(s) (20 mg total) by mouth daily.   Labs: 07/24/2024 Creatinine 1.20, BUN 29, Potassium 4.8, Sodium 137, GFR 47 07/08/2024 Creatinine 1.34, BUN 28, Potassium 4.8, Sodium 139, GFR 41 06/18/2024 Creatinine 0.92, BUN 25, Potassium 4.3, Sodium 140, GFR >60 06/16/2024 Creatinine 0.99, BUN 27, Potassium 4.2, Sodium 136, GFR 59 04/25/2024 Creatinine 1.18, BUN 27, Potassium 4.7, Sodium 140, GFR 48  04/17/2024 Creatinine 1.25, BUN 32, Potassium 5.5, Sodium 136, GFR 45  03/28/2024 Creatinine 1.11, BUN 28, Potassium 5.3, Sodium 141, GFR 52  03/17/2024 Creatinine 1.39, BUN 34, Potassium 4.6, Sodium 140 12/27/2023 Creatinine 1.04, BUN 32, Potassium 4.4, Sodium 137, GFR 56  11/22/2023 Creatinine 1.17, BUN 33, Potassium 4.5, Sodium 135, GFR 48  A complete set of results can be found in Results Review.   Recommendations:  No changes and encouraged to call if experiencing any fluid symptoms.   Follow-up plan: ICM clinic phone appointment on 11/03/2024.   91 day device clinic remote transmission 11/13/2024.     EP/Cardiology Office Visits:   10/08/2024 with HF clinc.  10/03/2024 with Charlies Riling, PA.   Copy of ICM check sent to Dr. Waddell.  Remote monitoring is medically necessary for Heart  Failure Management.    Daily Thoracic Impedance ICM trend: 06/24/2024 through 09/22/2024.    12-14 Month Thoracic Impedance ICM trend:     Mitzie GORMAN Garner, RN 09/24/2024 3:23 PM

## 2024-10-01 NOTE — Progress Notes (Unsigned)
 Cardiology Office Note:  .   Date:  10/01/2024  ID:  Katherine Larson, DOB 01-23-1946, MRN [id number] PCP: Katherine Channel, MD  [facility] Providers Cardiologist:  Katherine Emmer, MD { EP: Katherine Larson   History of Present Illness: .   Katherine Larson is a 78 y.o. female w/PMHx of  RA, chronic back pain (has spinal cord stimulator) Uses O2 at HS HTN, HLD CAD  (CABG 1996) ICM, chronic CHF LBBB ICD   She saw Katherine Larson 521/25, discussed worsening HF symptoms, development of LBBB, planned for ICD > CRT  05/14/24, single chamber ICD > CRT-D  Saw Katherine Larson 07/08/24 S/p TKR, not too active since, though working on it Planned for TTE post CRT  LVEF up to 45-50%  I saw her 09/01/24 Generally she feels like she is doing well Since her upgrade feels like she has generally more stamina. Today has been a very busy day, had PT for her knee and 3 appointments, tired today No CP, palpitations No rest SOB, minimal DOE, still working on PT after her knee surgery in Aug No near syncope or syncope LV lead threshold up some and programmed appropriately Planned to f/u early to f/u on lead threshold  Today's visit is scheduled as her 90 day post upgrade visit ROS:   She worries about her husband, has been in/out of the hospital with lengthy stays, is in poor health Has some help at home, but perhaps taking a bit of a toll on her No CP, palpitations No rest SOB, minimal DOE No near syncope or syncope  She has lost weight Seeing her hematologist regularly > she thinks perhaps stress related > eating her usual  Device information SJM single chamber ICD implanted 02/04/2021, upgrade to Abbott CRT-D 05/14/24   Hx of: Admitted 02/2023 with CAP, BCx + for Pseudomonas aeruginosa, concern for bacteriemia in presence of ICD. ID and Cardiology consulted, and underwent TEE, showing LVEF 30-35%, normal RV, no LAA thrombus, no valvular or ICD vegetations. She was continued on 2 weeks of  ciprofloxacin .   Arrhythmia/AAD hx None   Studies Reviewed: SABRA    EKG not done today 07/08/24 personally reviewed, SR, V paced (back stim artifact), QRS morphology looks OK, duration  Limited DEVICE interrogation done today to revisit LV lead threshold, reviewed by myself Battery and RA/RV lead measurements are good LV lead impedance is stable 2 SVT, brief, not near treatment zone   08/25/24: TTE 1. Left ventricular ejection fraction, by estimation, is 45 to 50%. Left  ventricular ejection fraction by 2D MOD biplane is 46.8 %. Left  ventricular ejection fraction by PLAX is 33 %. The left ventricle has  mildly decreased function. The left ventricle  demonstrates global hypokinesis. There is mild left ventricular  hypertrophy. cannot be evaluated due to significant mitral annular  calcification. The average left ventricular global longitudinal strain is  -14.4 %. The global longitudinal strain is  abnormal.   2. Right ventricular systolic function is normal. The right ventricular  size is normal. Tricuspid regurgitation signal is inadequate for assessing  PA pressure.   3. The mitral valve is normal in structure. Trivial mitral valve  regurgitation. Mild mitral stenosis. The mean mitral valve gradient is 4.0  mmHg with average heart rate of 87 bpm. Moderate mitral annular  calcification.   4. The aortic valve is tricuspid. Aortic valve regurgitation is mild. No  aortic stenosis is present.   5. The inferior vena cava is normal  in size with greater than 50%  respiratory variability, suggesting right atrial pressure of 3 mmHg.    Risk Assessment/Calculations:    Physical Exam:   VS:  There were no vitals taken for this visit.   Wt Readings from Last 3 Encounters:  09/08/24 120 lb 4.8 oz (54.6 kg)  09/01/24 128 lb (58.1 kg)  08/27/24 126 lb 1.7 oz (57.2 kg)    GEN: Well nourished, well developed in no acute distress NECK: No JVD; No carotid bruits CARDIAC: RRR, no  murmurs, rubs, gallops RESPIRATORY: CTA b/l without rales, wheezing or rhonchi  ABDOMEN: Soft, non-tender, non-distended EXTREMITIES: No edema; No deformity   ICD site: is stable, well healed, no thinning, fluctuation, tethering  ASSESSMENT AND PLAN: .    CRT-D Intact function No programming changes made  CAD ICM Chronic CHF (systolic) Improved LVEF to 45-50% post CRT 96% BP Corvue looks good No symptoms or exam findings of volume OL  She may not need as much diuretic (weight loss, perhaps multifactorial) Meds deferred to AHF team C/w Dr. Marciano  Weight loss She sees heme again soon, AHF team next week.  Dispo: remotes as usual, in clinic in 44mo , sooner if needed  Signed, Katherine Macario Arthur, PA-C

## 2024-10-02 ENCOUNTER — Encounter (HOSPITAL_COMMUNITY): Payer: Self-pay | Admitting: Oncology

## 2024-10-02 ENCOUNTER — Other Ambulatory Visit (HOSPITAL_COMMUNITY): Payer: Self-pay

## 2024-10-02 ENCOUNTER — Telehealth (HOSPITAL_COMMUNITY): Payer: Self-pay

## 2024-10-02 NOTE — Telephone Encounter (Signed)
 Advanced Heart Failure Patient Advocate Encounter  Prior authorization for Repatha  has been submitted and approved. Test billing returns $0 for 28 day supply.  KeyBETHA SEVERS Effective: 10/02/2024 to 11/19/2025  Rachel DEL, CPhT Rx Patient Advocate Phone: [phone]

## 2024-10-03 ENCOUNTER — Encounter: Payer: Self-pay | Admitting: Physician Assistant

## 2024-10-03 ENCOUNTER — Ambulatory Visit: Attending: Physician Assistant | Admitting: Physician Assistant

## 2024-10-03 ENCOUNTER — Ambulatory Visit: Admitting: Student

## 2024-10-03 VITALS — BP 130/70 | HR 95 | Ht 62.5 in | Wt 116.0 lb

## 2024-10-03 DIAGNOSIS — Z95810 Presence of automatic (implantable) cardiac defibrillator: Secondary | ICD-10-CM

## 2024-10-03 DIAGNOSIS — I255 Ischemic cardiomyopathy: Secondary | ICD-10-CM

## 2024-10-03 DIAGNOSIS — I5022 Chronic systolic (congestive) heart failure: Secondary | ICD-10-CM | POA: Diagnosis not present

## 2024-10-03 NOTE — Patient Instructions (Signed)
 Medication Instructions:   Your physician recommends that you continue on your current medications as directed. Please refer to the Current Medication list given to you today.   *If you need a refill on your cardiac medications before your next appointment, please call your pharmacy*   Lab Work: NONE ORDERED  TODAY    If you have labs (blood work) drawn today and your tests are completely normal, you will receive your results only by: MyChart Message (if you have MyChart) OR A paper copy in the mail If you have any lab test that is abnormal or we need to change your treatment, we will call you to review the results.   Testing/Procedures: NONE ORDERED  TODAY    Follow-Up: At [facility], you and your health needs are our priority.  As part of our continuing mission to provide you with exceptional heart care, our providers are all part of one team.  This team includes your primary Cardiologist (physician) and Advanced Practice Providers or APPs (Physician Assistants and Nurse Practitioners) who all work together to provide you with the care you need, when you need it.  Your next appointment:    6 month(s)   Provider:    Charlies Arthur, PA-C    We recommend signing up for the patient portal called "MyChart".  Sign up information is provided on this After Visit Summary.  MyChart is used to connect with patients for Virtual Visits (Telemedicine).  Patients are able to view lab/test results, encounter notes, upcoming appointments, etc.  Non-urgent messages can be sent to your provider as well.   To learn more about what you can do with MyChart, go to [email or web address].   Other Instructions

## 2024-10-06 ENCOUNTER — Inpatient Hospital Stay

## 2024-10-06 ENCOUNTER — Inpatient Hospital Stay: Attending: Physician Assistant

## 2024-10-06 DIAGNOSIS — N1831 Chronic kidney disease, stage 3a: Secondary | ICD-10-CM | POA: Insufficient documentation

## 2024-10-06 DIAGNOSIS — D631 Anemia in chronic kidney disease: Secondary | ICD-10-CM | POA: Diagnosis present

## 2024-10-06 DIAGNOSIS — I130 Hypertensive heart and chronic kidney disease with heart failure and stage 1 through stage 4 chronic kidney disease, or unspecified chronic kidney disease: Secondary | ICD-10-CM | POA: Insufficient documentation

## 2024-10-06 LAB — CBC
HCT: 35.2 % — ABNORMAL LOW (ref 36.0–46.0)
Hemoglobin: 11.1 g/dL — ABNORMAL LOW (ref 12.0–15.0)
MCH: 31.9 pg (ref 26.0–34.0)
MCHC: 31.5 g/dL (ref 30.0–36.0)
MCV: 101.1 fL — ABNORMAL HIGH (ref 80.0–100.0)
Platelets: 207 K/uL (ref 150–400)
RBC: 3.48 MIL/uL — ABNORMAL LOW (ref 3.87–5.11)
RDW: 14.7 % (ref 11.5–15.5)
WBC: 10 K/uL (ref 4.0–10.5)
nRBC: 0 % (ref 0.0–0.2)

## 2024-10-06 NOTE — Progress Notes (Signed)
 Patient's Hgb 11.1. Per parameters pt does not need Retacrit  shot today. Pt updated and all questions answered at this time. All follow ups as scheduled.   Alyria Krack

## 2024-10-07 ENCOUNTER — Telehealth (HOSPITAL_COMMUNITY): Payer: Self-pay

## 2024-10-07 NOTE — Telephone Encounter (Signed)
 Called to confirm/remind patient of their appointment at the [facility] on 10/08/2024.   Appointment:   [x] Confirmed  [] Left mess   [] No answer/No voice mail  [] VM Full/unable to leave message  [] Phone not in service  Patient reminded to bring all medications and/or complete list.  Confirmed patient has transportation. Gave directions, instructed to utilize valet parking.

## 2024-10-08 ENCOUNTER — Encounter (HOSPITAL_COMMUNITY): Payer: Self-pay

## 2024-10-08 ENCOUNTER — Ambulatory Visit (HOSPITAL_COMMUNITY)
Admission: RE | Admit: 2024-10-08 | Discharge: 2024-10-08 | Disposition: A | Discharge status: Home / Self Care | Source: Ambulatory Visit | Attending: Family Medicine | Admitting: Family Medicine

## 2024-10-08 VITALS — BP 106/60 | HR 89 | Ht 62.5 in | Wt 118.6 lb

## 2024-10-08 DIAGNOSIS — I5022 Chronic systolic (congestive) heart failure: Secondary | ICD-10-CM | POA: Diagnosis present

## 2024-10-08 DIAGNOSIS — I340 Nonrheumatic mitral (valve) insufficiency: Secondary | ICD-10-CM | POA: Insufficient documentation

## 2024-10-08 DIAGNOSIS — Z79899 Other long term (current) drug therapy: Secondary | ICD-10-CM | POA: Insufficient documentation

## 2024-10-08 DIAGNOSIS — Z7982 Long term (current) use of aspirin: Secondary | ICD-10-CM | POA: Insufficient documentation

## 2024-10-08 DIAGNOSIS — I2510 Atherosclerotic heart disease of native coronary artery without angina pectoris: Secondary | ICD-10-CM | POA: Diagnosis not present

## 2024-10-08 DIAGNOSIS — Z951 Presence of aortocoronary bypass graft: Secondary | ICD-10-CM | POA: Insufficient documentation

## 2024-10-08 DIAGNOSIS — Z95810 Presence of automatic (implantable) cardiac defibrillator: Secondary | ICD-10-CM | POA: Diagnosis not present

## 2024-10-08 DIAGNOSIS — I110 Hypertensive heart disease with heart failure: Secondary | ICD-10-CM | POA: Insufficient documentation

## 2024-10-08 DIAGNOSIS — I429 Cardiomyopathy, unspecified: Secondary | ICD-10-CM | POA: Diagnosis not present

## 2024-10-08 DIAGNOSIS — Z96651 Presence of right artificial knee joint: Secondary | ICD-10-CM | POA: Insufficient documentation

## 2024-10-08 DIAGNOSIS — M069 Rheumatoid arthritis, unspecified: Secondary | ICD-10-CM | POA: Insufficient documentation

## 2024-10-08 DIAGNOSIS — J9611 Chronic respiratory failure with hypoxia: Secondary | ICD-10-CM | POA: Diagnosis not present

## 2024-10-08 DIAGNOSIS — I447 Left bundle-branch block, unspecified: Secondary | ICD-10-CM | POA: Diagnosis not present

## 2024-10-08 DIAGNOSIS — Z7984 Long term (current) use of oral hypoglycemic drugs: Secondary | ICD-10-CM | POA: Diagnosis not present

## 2024-10-08 NOTE — Progress Notes (Signed)
 [facility]   PCP: Teresa Channel, MD Cardiology: Dr. Delford HF Cardiology: Dr. Rolan  78 y.o. with history of CAD s/p CABG and ischemic cardiomyopathy was referred by Dr. Delford for evaluation of CHF.  Patient has a long h/o heart disease.  In 1996, she had CABG with sequential LIMA-LAD and diagonal.  Last cath in 5/21 showed totally occluded LAD with patent LIMA-LAD and diagonal, minimal disease in LCx and RCA. LV systolic function has been reduced.  She has a Secondary School Teacher ICD.  Last echo in 9/22 showed EF 25-30%, low normal RV function, moderate MR.  She has a chronic LBBB.  She additionally has rheumatoid arthritis and significant low back pain for which she has a spinal stimulator.   She tried to take Entresto  in the past and was very tired/fatigued on it.  She was unable to tolerate it even at low dose and stopped it.  She was lightheaded when we tried to increase Coreg  to 12.5 mg bid.   Follow up 1/24, NYHA II and volume stable. 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 ID and Cardiology consulted, and underwent TEE, showing LVEF 30-35%, normal RV, no LAA thrombus, no valvular or ICD vegetations. She was continued on 2 weeks of ciprofloxacin .  Echo in 4/25 showed EF 35-40%, normal RV.   St Jude device upgraded to CRT-D in 6/25.  S/p right TKR 8/25.  Echo 10/25 showed EF 45-50%, normal RV.  Today she returns for HF follow up. Overall feeling fine. She is not SOB with activity, she is legally-blind and husband helps with her pill box. Denies palpitations, abnormal bleeding, CP, dizziness, edema, or PND/Orthopnea. Appetite ok. Weight at home 115 pounds. Taking all medications.   St Jude device interrogation (personally reviewed): CorVue stable, 97% BiV pacing, no VT/AF.  Labs  (2/25): K 4.4, creatinine 1.04 Labs (4/25): hgb 10.4, LDL 122  Labs (5/25): K 5.3, creatinine 1.11 Labs (7/25): K 4.3, creatinine 0.92 Labs (8/25): LDL 6 Labs (10/25): K 4.8, creatinine 1.10  PMH: 1. HTN 2. Hyperlipidemia 3. GERD 4. Gout 5. Fe deficiency anemia 6. CAD: CABG in 1996 with sequential LIMA-diagonal and LAD.  - LHC (5/21): totally occluded LAD with patent sequent LIMA-D and LAD, no significant LCx or RCA disease.  7. Chronic systolic CHF: Ischemic cardiomyopathy.  St Jude CRT-D device.  - Echo (9/22): EF 25-30%, low normal RV function, moderate MR.  - Echo (2/24): EF 30-35%, normal RV - TEE (3/24): LVEF 30-35%, normal RV, no LAA thrombus, no valvular or ICD vegetations. - Echo (4/25): EF 35-40%, normal RV - Echo (10/25): EF 45-50%, normal RV 8. Low back pain: Arthritis, has spinal stimulator.  9. Chronic LBBB 10. Rheumatoid arthritis.  11. COPD: Prior smoker.   Social History   Socioeconomic History   Marital status: Married    Spouse name: Not on file   Number of children: 3   Years of education: Not on file   Highest education level: Not on file  Occupational History   Not on file  Tobacco Use   Smoking status: Former    Current packs/day: 0.00    Average packs/day: 0.8 packs/day for 15.0 years (11.3 ttl pk-yrs)    Types: Cigarettes    Start date: 11/21/1979    Quit date: 11/20/1994  Years since quitting: 29.9   Smokeless tobacco: Never   Tobacco comments:    Quit in 1996  Vaping Use   Vaping status: Never Used  Substance and Sexual Activity   Alcohol  use: Never    Alcohol /week: 0.0 standard drinks of alcohol    Drug use: Never   Sexual activity: Yes  Other Topics Concern   Not on file  Social History Narrative   2 living children, one deceased age 70   Right handed   One story home   Drinks coffee am   Social Drivers of Corporate Investment Banker Strain: Not on file  Food Insecurity: No Food Insecurity (06/30/2024)   Hunger Vital Sign     Worried About Running Out of Food in the Last Year: Never true    Ran Out of Food in the Last Year: Never true  Transportation Needs: No Transportation Needs (06/30/2024)   PRAPARE - Administrator, Civil Service (Medical): No    Lack of Transportation (Non-Medical): No  Physical Activity: Not on file  Stress: Not on file  Social Connections: Moderately Isolated (06/30/2024)   Social Connection and Isolation Panel    Frequency of Communication with Friends and Family: More than three times a week    Frequency of Social Gatherings with Friends and Family: More than three times a week    Attends Religious Services: Never    Database Administrator or Organizations: No    Attends Banker Meetings: Never    Marital Status: Married  Catering Manager Violence: Not At Risk (06/30/2024)   Humiliation, Afraid, Rape, and Kick questionnaire    Fear of Current or Ex-Partner: No    Emotionally Abused: No    Physically Abused: No    Sexually Abused: No   Family History  Problem Relation Age of Onset   Heart attack Father    Heart attack Mother    Bladder Cancer Brother    Cervical cancer Daughter        ?   Colon cancer Neg Hx    ROS: All systems reviewed and negative except as per HPI.   Current Outpatient Medications  Medication Sig Dispense Refill   acetaminophen  (TYLENOL ) 500 MG tablet Take 1 tablet (500 mg total) by mouth every 6 (six) hours as needed. 30 tablet 0   albuterol  (VENTOLIN  HFA) 108 (90 Base) MCG/ACT inhaler Inhale 1-2 puffs into the lungs every 6 (six) hours as needed for wheezing or shortness of breath. 18 g 0   allopurinol  (ZYLOPRIM ) 300 MG tablet Take 300 mg by mouth daily.     aspirin  EC 325 MG tablet Take 1 tablet (325 mg total) by mouth 2 (two) times daily. 30 tablet 0   atorvastatin  (LIPITOR ) 40 MG tablet Take 1 tablet (40 mg total) by mouth daily. 90 tablet 3   budesonide -formoterol  (SYMBICORT ) 160-4.5 MCG/ACT inhaler Inhale 2 puffs into the  lungs in the morning and at bedtime. 3 each 3   carvedilol  (COREG ) 6.25 MG tablet Take 1 tablet (6.25 mg total) by mouth 2 (two) times daily with a meal. 180 tablet 3   cholecalciferol (VITAMIN D3) 25 MCG (1000 UT) tablet Take 1,000 Units by mouth daily.     ciprofloxacin  (CIPRO ) 250 MG tablet 1 tablet Orally every 12 hrs; Duration: 5 days     etanercept (ENBREL SURECLICK) 50 MG/ML injection Inject 50 mg into the skin every Monday.     Evolocumab  (REPATHA  SURECLICK) 140 MG/ML SOAJ Inject  140 mg into the skin every 14 (fourteen) days. 6 mL 3   FARXIGA  10 MG TABS tablet TAKE 1 TABLET(10 MG) BY MOUTH DAILY BEFORE BREAKFAST 30 tablet 10   ferrous sulfate 325 (65 FE) MG tablet Take 325 mg by mouth daily with breakfast.     fexofenadine (ALLEGRA) 180 MG tablet Take 180 mg by mouth daily.     fluticasone  (FLONASE ) 50 MCG/ACT nasal spray Place 1 spray into both nostrils daily.     furosemide  (LASIX ) 40 MG tablet Take 0.5 tablets (20 mg total) by mouth daily. 45 tablet 3   gabapentin  (NEURONTIN ) 300 MG capsule Take 300 mg by mouth 2 (two) times daily.     lansoprazole  (PREVACID ) 30 MG capsule Take 1 capsule (30 mg total) by mouth daily before breakfast. 90 capsule 3   leflunomide  (ARAVA ) 10 MG tablet Take 10 mg by mouth daily.     MAGNESIUM -OXIDE 400 (240 Mg) MG tablet TAKE 1 TABLET(400 MG) BY MOUTH DAILY 30 tablet 2   meclizine  (ANTIVERT ) 12.5 MG tablet Take 12.5 mg by mouth 3 (three) times daily as needed for dizziness.     montelukast  (SINGULAIR ) 10 MG tablet Take 10 mg by mouth at bedtime.     Multiple Vitamins-Minerals (PRESERVISION AREDS 2) CAPS Take 1 capsule by mouth 2 (two) times daily.     nitroGLYCERIN  (NITROSTAT ) 0.4 MG SL tablet DISSOLVE 1 TABLET UNDER THE  TONGUE EVERY 5 MINUTES AS NEEDED FOR CHEST PAIN. MAX OF 3 TABLETS IN 15 MINUTES. CALL 911 IF PAIN  PERSISTS. 75 tablet 4   ondansetron  (ZOFRAN -ODT) 4 MG disintegrating tablet 1 tablet on the tongue and allow to dissolve Orally Once a day;  Duration: 30 day(s)     oxyCODONE  (ROXICODONE ) 5 MG immediate release tablet Take 1 tablet (5 mg total) by mouth every 4 (four) hours as needed for severe pain (pain score 7-10). 30 tablet 0   oxyCODONE -acetaminophen  (PERCOCET) 10-325 MG tablet Take 1 tablet by mouth in the morning, at noon, in the evening, and at bedtime.     sacubitril -valsartan  (ENTRESTO ) 24-26 MG Take 1 tablet by mouth 2 (two) times daily. 60 tablet 6   spironolactone  (ALDACTONE ) 25 MG tablet Take 0.5 tablets (12.5 mg total) by mouth daily. 45 tablet 3   tiZANidine  (ZANAFLEX ) 2 MG tablet Take 1 tablet (2 mg total) by mouth every 6 (six) hours as needed. 60 tablet 0   VASCEPA  1 g capsule TAKE 1 CAPSULE(1 GRAM) BY MOUTH TWICE DAILY 120 capsule 10   vitamin B-12 (CYANOCOBALAMIN ) 250 MCG tablet Take 250 mcg by mouth daily.     docusate sodium  (COLACE) 100 MG capsule Take 1 capsule (100 mg total) by mouth 2 (two) times daily. 60 capsule 2   doxycycline  (VIBRA -TABS) 100 MG tablet Take 100 mg by mouth 2 (two) times daily. (Patient not taking: Reported on 10/03/2024)     losartan  (COZAAR ) 100 MG tablet Take 1 tablet (100 mg total) by mouth daily. (Patient not taking: Reported on 10/08/2024) 90 tablet 1   No current facility-administered medications for this encounter.   Wt Readings from Last 3 Encounters:  10/08/24 53.8 kg (118 lb 9.6 oz)  10/03/24 52.6 kg (116 lb)  09/08/24 54.6 kg (120 lb 4.8 oz)   BP 106/60   Pulse 89   Ht 5' 2.5 (1.588 m)   Wt 53.8 kg (118 lb 9.6 oz)   SpO2 94%   BMI 21.35 kg/m   PHYSICAL EXAM: General:  NAD. No resp  difficulty, walked into clinic HEENT: Normal Neck: Supple. No JVD. Cor: Regular rate & rhythm. No rubs, gallops or murmurs. Lungs: Clear Abdomen: Soft, nontender, nondistended.  Extremities: No cyanosis, clubbing, rash, edema Neuro: Alert & oriented x 3, moves all 4 extremities w/o difficulty. Affect pleasant.  Assessment/Plan: 1. Chronic systolic CHF: St Jude CRT-D device.   Probably ischemic cardiomyopathy (h/o occluded LAD, s/p CABG). Coronaries have been unchanged since CABG in 1996. However, EF has declined over the years and I worry that another process may be present.  Unable to obtain cardiac MRI with spinal cord stimulator. 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 TEE (3/24) showed LVEF 30-35%, normal RV. Echo in 2/25 showed EF 35-40%, normal RV. Echo 10/25, post CRT upgrade, showed improved EF 45-50%. NYHA class I, not volume overloaded on exam or by Corvue.   - Continue Entresto  24-26 mg bid. Did not tolerate higher doses in the past   - Continue spironolactone  12.5 mg daily and follow low K diet.  Labs reviewed from 09/08/24 and are stable, K 4.8, creatinine 1.10. - Continue Farxiga  10 mg daily.  - Continue Coreg  6.25 mg bid.  2. CAD: CABG 1996.  Cath in 5/21 due to fall in EF showed totally occluded LAD, patent RCA and LCx, patent LIMA-LAD and diagonal.  No change, no interventional target.  No chest pain.  - Continue ASA 81 mg daily.  - Continue atorvastatin  80 mg daily and Repatha , LDL 6 3. Chronic Respiratory Failure with hypoxia: Admission (3/24) for multilobar PNA, BCx + pseudomonas aeruginosa. Required BiPap and eventually discharged with home O2. CXR suggestive of chronic emphysema and scarring. Of note, had COVID 2/24. Using oxygen  at night.  4. Rheumatoid arthritis: Now s/p right TKR.   Follow up in 6 months with Dr. Rolan.   Harlene HERO McIntosh, FNP-BC 10/08/2024

## 2024-10-08 NOTE — Patient Instructions (Addendum)
 Good to see you today!    Your physician recommends that you schedule a follow-up appointment  6 months (May) Call office in March to schedule an appointment  If you have any questions or concerns before your next appointment please send us  a message through [facility] or call our office at [phone].    TO LEAVE A MESSAGE FOR THE NURSE SELECT OPTION 2, PLEASE LEAVE A MESSAGE INCLUDING: YOUR NAME DATE OF BIRTH CALL BACK NUMBER REASON FOR CALL**this is important as we prioritize the call backs  YOU WILL RECEIVE A CALL BACK THE SAME DAY AS LONG AS YOU CALL BEFORE 4:00 PM At the [facility], you and your health needs are our priority. As part of our continuing mission to provide you with exceptional heart care, we have created designated Provider Care Teams. These Care Teams include your primary Cardiologist (physician) and Advanced Practice Providers (APPs- Physician Assistants and Nurse Practitioners) who all work together to provide you with the care you need, when you need it.   You may see any of the following providers on your designated Care Team at your next follow up: Dr Toribio Fuel Dr Ezra Shuck Dr. Morene Brownie Greig Mosses, NP Caffie Shed, [place] [facility] [place], [place] Beckey Coe, NP Jordan Lee, NP Ellouise Class, NP Tinnie Redman, PharmD Jaun Bash, PharmD   Please be sure to bring in all your medications bottles to every appointment.    Thank you for choosing [facility]

## 2024-10-21 ENCOUNTER — Encounter: Payer: Self-pay | Admitting: Internal Medicine

## 2024-10-21 ENCOUNTER — Telehealth: Payer: Self-pay | Admitting: *Deleted

## 2024-10-21 ENCOUNTER — Other Ambulatory Visit: Payer: Self-pay

## 2024-10-21 ENCOUNTER — Ambulatory Visit: Admitting: Internal Medicine

## 2024-10-21 VITALS — BP 138/78 | HR 114 | Temp 97.7°F | Ht 62.5 in | Wt 115.8 lb

## 2024-10-21 DIAGNOSIS — R1013 Epigastric pain: Secondary | ICD-10-CM

## 2024-10-21 DIAGNOSIS — G8929 Other chronic pain: Secondary | ICD-10-CM

## 2024-10-21 DIAGNOSIS — R1010 Upper abdominal pain, unspecified: Secondary | ICD-10-CM

## 2024-10-21 DIAGNOSIS — R109 Unspecified abdominal pain: Secondary | ICD-10-CM

## 2024-10-21 DIAGNOSIS — R634 Abnormal weight loss: Secondary | ICD-10-CM

## 2024-10-21 MED ORDER — PANTOPRAZOLE SODIUM 40 MG PO TBEC: 40.0000 mg | DELAYED_RELEASE_TABLET | Freq: Every day | ORAL | 11 refills | Status: AC

## 2024-10-21 NOTE — Telephone Encounter (Signed)
 [facility] PA for CTA: Case Number: [id number] Review Date: 10/21/2024 4:30:12 PM Expiration Date: N/A Status: This member's benefit plan did not require a prior authorization for this request.

## 2024-10-21 NOTE — Progress Notes (Unsigned)
 Gastroenterology Progress Note    Primary Care Physician:  Teresa Channel, MD Primary Gastroenterologist:  Dr. Shaaron  Pre-Procedure History & Physical: HPI:  Katherine Larson is a 78 y.o. female here for for evaluation of a 2 to 76-month history of postprandial abdominal pain with associated anorexia and a 15 pound weight loss over the past 7 months.  This lady has significant comorbidities including CAD, CKD, CHF myopathy with AICD history lymphocytic colitis (in remission).  Prior MI in the distant past, rheumatoid arthritis.  Seen here in May for reflux.  Prescribed pantoprazole  at that time.  States she is not taking anything for reflux at this time and has reflux symptoms every day no dysphagia possibly some vague early satiety. Moving her bowels daily. History of anemia/GI bleed for which she has undergone EGD colonoscopy and capsule study over the past several years.  She may have passed some blood recently but is not sure she is legally blind.  States she moves her bowels daily.  Denies constipation or diarrhea.  Past Medical History:  Diagnosis Date   AICD (automatic cardioverter/defibrillator) present    Anemia    anemia of chronic disease +/- IDA followed by Dr. Gatha. received iron  infusions in the past   Borderline glaucoma    CAD (coronary artery disease)    a. s/p CABG 1996. b. low risk nuc 2011. c. LHC 04/2016 due to drop in EF -> occluded native LAD,  widely patent sequential LIMA-D1-LAD.   Chronic kidney disease    mild stage of kidney disease   Chronic systolic CHF (congestive heart failure) (HCC)    Complication of anesthesia    DM2 (diabetes mellitus, type 2) (HCC)    not on any medicine for this at this time, 05/2016   Dyspnea    with exertion   GERD (gastroesophageal reflux disease)    Gout    History of blood transfusion    History of hiatal hernia    in 20s   History of pneumonia    March, 2016   HLD (hyperlipidemia)    HTN (hypertension)     Inappropriate sinus tachycardia    LBBB (left bundle branch block)    a. Seen in 04/2016   Lymphocytic colitis 04/2020   Macular degeneration    left   Myocardial infarction [facility])    1996   Neuropathy    Neuropathy    NICM (nonischemic cardiomyopathy) (HCC)    a. Dx 04/2016 - EF 25-30%, diffuse HK, elevated LVEDP, mild MR, mod LAE.   Normocytic anemia 10/17/2021   NSVT (nonsustained ventricular tachycardia) (HCC)    PAT (paroxysmal atrial tachycardia)    Pneumonia    PONV (postoperative nausea and vomiting)    after just about every surgery I've had   Rheumatoid arthritis (HCC)    RA- hands   Seasonal allergies     Past Surgical History:  Procedure Laterality Date   ABDOMINAL HYSTERECTOMY     APPENDECTOMY     BACK SURGERY     BIOPSY  04/27/2020   Procedure: BIOPSY;  Surgeon: Shaaron Lamar HERO, MD;  Location: [facility]O SU[facility];  Service: Endoscopy;;  ascending colon biopsy   BIV UPGRADE N/A 05/14/2024   Procedure: BIV UPGRADE;  Surgeon: Waddell Danelle ORN, MD;  Location: [facility] INVASIVE CV LAB;  Service: Cardiovascular;  Laterality: N/A;   CARDIAC CATHETERIZATION N/A 05/01/2016   Procedure: Right/Left Heart Cath and Coronary/Graft Angiography;  Surgeon: Alm ORN Clay, MD;  Location: [facility] INVASIVE  CV LAB;  Service: Cardiovascular;  Laterality: N/A;   CHOLECYSTECTOMY     COLONOSCOPY  11/2011   Dr. Magod:ext/int hemorrhoids, diverticulosis sigmoid colon and distal desc colon, mid-desc colon hyperplastic polyps.    COLONOSCOPY WITH PROPOFOL  N/A 04/27/2020   [place]: Diverticulosis, hemorrhoids, normal terminal ileum.  Random colon biopsies significant for chronic lymphocytic colitis. No repeat due to age.   CORONARY ARTERY BYPASS GRAFT  1996   2 vessels   ESOPHAGOGASTRODUODENOSCOPY  11/2011   Dr. Rosalie: small hiatal hernia, one non-bleeding superficial gastric ulcer, medium-sized diverticulum in area of papilla   ESOPHAGOGASTRODUODENOSCOPY N/A 12/29/2015   Dr. Shaaron: Chronic inactive  gastritis, normal esophagus status post dilation, duodenal diverticula   ESOPHAGOGASTRODUODENOSCOPY (EGD) WITH PROPOFOL  N/A 07/24/2019   Dr. Shaaron: Normal esophagus status post empiric dilation, small hiatal hernia, large D2 diverticulum   ESOPHAGOGASTRODUODENOSCOPY (EGD) WITH PROPOFOL  N/A 06/03/2021   Surgeon: Shaaron Lamar HERO, MD; normal esophagus, normal stomach, normal duodenum.  Abnormal hypopharyngeal mucosa bilaterally just distal to the base of the tongue (right side greater than left), overlying mucosa appeared irregular and somewhat verrucous in appearance.  Recommended ENT evaluation.   EYE SURGERY Bilateral    cataract removal   FOOT SURGERY     removal bone spur   FRACTURE SURGERY Right    arm   GIVENS CAPSULE STUDY  11/2012   Dr. Rosalie: minimal gastritis, normal small bowel capsule   HERNIA REPAIR     hiatal hernia   ICD IMPLANT N/A 02/04/2021   Procedure: ICD IMPLANT;  Surgeon: Waddell Danelle ORN, MD;  Location: [facility] INVASIVE CV LAB;  Service: Cardiovascular;  Laterality: N/A;   MALONEY DILATION N/A 07/24/2019   Procedure: AGAPITO DILATION;  Surgeon: Shaaron Lamar HERO, MD;  Location: [facility];  Service: Endoscopy;  Laterality: N/A;   MALONEY DILATION N/A 06/03/2021   Procedure: AGAPITO DILATION;  Surgeon: Shaaron Lamar HERO, MD;  Location: [facility];  Service: Endoscopy;  Laterality: N/A;   MAXIMUM ACCESS (MAS)POSTERIOR LUMBAR INTERBODY FUSION (PLIF) 1 LEVEL N/A 08/14/2013   Procedure:  MAXIMUM ACCESS SURGERY(MAS) POSTERIOR LUMBAR INTERBODY FUSION LUMBAR THREE-FOUR ;  Surgeon: Alm GORMAN Molt, MD;  Location: [facility] NEURO ORS;  Service: Neurosurgery;  Laterality: N/A;   MAXIMUM ACCESS SURGERY(MAS) POSTERIOR LUMBAR INTERBODY FUSION LUMBAR THREE-FOUR    PTCA     RIGHT/LEFT HEART CATH AND CORONARY ANGIOGRAPHY N/A 03/26/2020   Procedure: RIGHT/LEFT HEART CATH AND CORONARY ANGIOGRAPHY;  Surgeon: Anner Alm ORN, MD;  Location: [facility] INVASIVE CV LAB;  Service: Cardiovascular;  Laterality: N/A;    SPINAL CORD STIMULATOR BATTERY EXCHANGE N/A 01/19/2021   Procedure: Spinal cord stimulator battery change;  Surgeon: Darlis Deatrice GORMAN, MD;  Location[facility] OR;  Service: Neurosurgery;  Laterality: N/A;   SPINAL CORD STIMULATOR INSERTION N/A 06/16/2016   Procedure: LUMBAR SPINAL CORD STIMULATOR INSERTION;  Surgeon: Deward Fabian, MD;  Location: [facility] NEURO ORS;  Service: Neurosurgery;  Laterality: N/A;  LUMBAR SPINAL CORD STIMULATOR INSERTION   TEE WITHOUT CARDIOVERSION N/A 02/01/2023   Procedure: TRANSESOPHAGEAL ECHOCARDIOGRAM (TEE);  Surgeon: Debera Jayson MATSU, MD;  Location: [facility] ORS;  Service: Cardiovascular;  Laterality: N/A;   tens  05/2016   TONSILLECTOMY     TOTAL KNEE ARTHROPLASTY Right 06/30/2024   Procedure: ARTHROPLASTY, KNEE, TOTAL;  Surgeon: Yvone Rush, MD;  Location: [facility] ORS;  Service: Orthopedics;  Laterality: Right;  RIGHT TOTAL KNEE ARTHROPLASTY    Prior to Admission medications   Medication Sig Start Date End Date Taking? Authorizing Provider  acetaminophen  (TYLENOL )  500 MG tablet Take 1 tablet (500 mg total) by mouth every 6 (six) hours as needed. 07/24/24  Yes Beatty, Celeste A, PA-C  albuterol  (VENTOLIN  HFA) 108 (90 Base) MCG/ACT inhaler Inhale 1-2 puffs into the lungs every 6 (six) hours as needed for wheezing or shortness of breath. 03/01/24  Yes Enedelia Dorna HERO, FNP  allopurinol  (ZYLOPRIM ) 300 MG tablet Take 300 mg by mouth daily.   Yes [provider]  aspirin  EC 325 MG tablet Take 1 tablet (325 mg total) by mouth 2 (two) times daily. 07/01/24  Yes Orlando Camellia POUR, PA-C  atorvastatin  (LIPITOR ) 40 MG tablet Take 1 tablet (40 mg total) by mouth daily. 08/25/24 11/24/24 Yes Rolan Ezra RAMAN, MD  budesonide -formoterol  (SYMBICORT ) 160-4.5 MCG/ACT inhaler Inhale 2 puffs into the lungs in the morning and at bedtime. 07/22/24  Yes Mannam, Praveen, MD  carvedilol  (COREG ) 6.25 MG tablet Take 1 tablet (6.25 mg total) by mouth 2 (two) times daily with a meal. 04/24/24  Yes Milford,  Harlene HERO, FNP  cholecalciferol (VITAMIN D3) 25 MCG (1000 UT) tablet Take 1,000 Units by mouth daily.   Yes [provider]  docusate sodium  (COLACE) 100 MG capsule Take 1 capsule (100 mg total) by mouth 2 (two) times daily. 07/01/24 07/01/25 Yes Orlando Camellia POUR, PA-C  doxycycline  (VIBRA -TABS) 100 MG tablet Take 100 mg by mouth 2 (two) times daily. 09/01/24  Yes [provider]  etanercept (ENBREL SURECLICK) 50 MG/ML injection Inject 50 mg into the skin every Monday. 07/17/22  Yes [provider]  Evolocumab  (REPATHA  SURECLICK) 140 MG/ML SOAJ Inject 140 mg into the skin every 14 (fourteen) days. 06/05/24  Yes Rolan Ezra RAMAN, MD  FARXIGA  10 MG TABS tablet TAKE 1 TABLET(10 MG) BY MOUTH DAILY BEFORE BREAKFAST 04/16/24  Yes McLean, Dalton S, MD  ferrous sulfate 325 (65 FE) MG tablet Take 325 mg by mouth daily with breakfast.   Yes [provider]  fexofenadine (ALLEGRA) 180 MG tablet Take 180 mg by mouth daily.   Yes [provider]  fluticasone  (FLONASE ) 50 MCG/ACT nasal spray Place 1 spray into both nostrils daily. 04/18/23  Yes [provider]  furosemide  (LASIX ) 40 MG tablet Take 0.5 tablets (20 mg total) by mouth daily. 03/17/24  Yes Rolan Ezra RAMAN, MD  gabapentin  (NEURONTIN ) 300 MG capsule Take 300 mg by mouth 2 (two) times daily.   Yes [provider]  lansoprazole  (PREVACID ) 30 MG capsule Take 1 capsule (30 mg total) by mouth daily before breakfast. 04/15/24  Yes Ezzard Sonny RAMAN, PA-C  leflunomide  (ARAVA ) 10 MG tablet Take 10 mg by mouth daily.   Yes [provider]  losartan  (COZAAR ) 100 MG tablet Take 1 tablet (100 mg total) by mouth daily. 06/12/24  Yes Bensimhon, Toribio SAUNDERS, MD  MAGNESIUM -OXIDE 400 (240 Mg) MG tablet TAKE 1 TABLET(400 MG) BY MOUTH DAILY 12/12/21  Yes Nishan, Peter C, MD  meclizine  (ANTIVERT ) 12.5 MG tablet Take 12.5 mg by mouth 3 (three) times daily as needed for dizziness.   Yes [provider]   mirtazapine (REMERON) 15 MG tablet Take 15 mg by mouth at bedtime. 10/13/24  Yes [provider]  montelukast  (SINGULAIR ) 10 MG tablet Take 10 mg by mouth at bedtime.   Yes [provider]  Multiple Vitamins-Minerals (PRESERVISION AREDS 2) CAPS Take 1 capsule by mouth 2 (two) times daily.   Yes [provider]  nitroGLYCERIN  (NITROSTAT ) 0.4 MG SL tablet DISSOLVE 1 TABLET UNDER THE  TONGUE EVERY  5 MINUTES AS NEEDED FOR CHEST PAIN. MAX OF 3 TABLETS IN 15 MINUTES. CALL 911 IF PAIN  PERSISTS. 01/10/22  Yes Waddell Danelle ORN, MD  ondansetron  (ZOFRAN -ODT) 4 MG disintegrating tablet 1 tablet on the tongue and allow to dissolve Orally Once a day; Duration: 30 day(s) 08/16/24  Yes [provider]  oxyCODONE  (ROXICODONE ) 5 MG immediate release tablet Take 1 tablet (5 mg total) by mouth every 4 (four) hours as needed for severe pain (pain score 7-10). 07/01/24  Yes Orlando Camellia POUR, PA-C  oxyCODONE -acetaminophen  (PERCOCET) 10-325 MG tablet Take 1 tablet by mouth in the morning, at noon, in the evening, and at bedtime.   Yes [provider]  sacubitril -valsartan  (ENTRESTO ) 24-26 MG Take 1 tablet by mouth 2 (two) times daily. 03/17/24  Yes Rolan Ezra RAMAN, MD  spironolactone  (ALDACTONE ) 25 MG tablet Take 0.5 tablets (12.5 mg total) by mouth daily. 07/08/24  Yes Rolan Ezra RAMAN, MD  tiZANidine  (ZANAFLEX ) 2 MG tablet Take 1 tablet (2 mg total) by mouth every 6 (six) hours as needed. 07/01/24  Yes Orlando Camellia POUR, PA-C  TRELEGY ELLIPTA 200-62.5-25 MCG/ACT AEPB Inhale 1 puff into the lungs daily. 07/23/24  Yes [provider]  VASCEPA  1 g capsule TAKE 1 CAPSULE(1 GRAM) BY MOUTH TWICE DAILY 02/18/24  Yes McLean, Dalton S, MD  vitamin B-12 (CYANOCOBALAMIN ) 250 MCG tablet Take 250 mcg by mouth daily.   Yes [provider]  ciprofloxacin  (CIPRO ) 250 MG tablet 1 tablet Orally every 12 hrs; Duration: 5 days Patient not taking: Reported on 10/21/2024 07/21/24   [provider]    Allergies as of 10/21/2024 - Review Complete 10/21/2024  Allergen Reaction Noted   Biaxin [clarithromycin] Rash and Other (See Comments) 02/02/2010   Penicillins Rash and Other (See Comments) 02/02/2010   Hydroxychloroquine  Other (See Comments) 10/11/2022   Sulfamethoxazole  12/26/2022   Sulfasalazine Other (See Comments) 10/11/2022   Losartan  potassium  10/09/2021    Family History  Problem Relation Age of Onset   Heart attack Father    Heart attack Mother    Bladder Cancer Brother    Cervical cancer Daughter        ?   Colon cancer Neg Hx     Social History   Socioeconomic History   Marital status: Married    Spouse name: Not on file   Number of children: 3   Years of education: Not on file   Highest education level: Not on file  Occupational History   Not on file  Tobacco Use   Smoking status: Former    Current packs/day: 0.00    Average packs/day: 0.8 packs/day for 15.0 years (11.3 ttl pk-yrs)    Types: Cigarettes    Start date: 11/21/1979    Quit date: 11/20/1994    Years since quitting: 29.9   Smokeless tobacco: Never   Tobacco comments:    Quit in 1996  Vaping Use   Vaping status: Never Used  Substance and Sexual Activity   Alcohol  use: Never    Alcohol /week: 0.0 standard drinks of alcohol    Drug use: Never   Sexual activity: Yes  Other Topics Concern   Not on file  Social History Narrative   2 living children, one deceased age 45   Right handed   One story home   Drinks coffee am   Social Drivers of Corporate Investment Banker Strain: Not on file  Food Insecurity: No Food Insecurity (06/30/2024)   Hunger Vital  Sign    Worried About Programme Researcher, Broadcasting/film/video in the Last Year: Never true    Ran Out of Food in the Last Year: Never true  Transportation Needs: No Transportation Needs (06/30/2024)   PRAPARE - Administrator, Civil Service (Medical): No    Lack of Transportation (Non-Medical): No  Physical Activity: Not on file   Stress: Not on file  Social Connections: Moderately Isolated (06/30/2024)   Social Connection and Isolation Panel    Frequency of Communication with Friends and Family: More than three times a week    Frequency of Social Gatherings with Friends and Family: More than three times a week    Attends Religious Services: Never    Database Administrator or Organizations: No    Attends Banker Meetings: Never    Marital Status: Married  Catering Manager Violence: Not At Risk (06/30/2024)   Humiliation, Afraid, Rape, and Kick questionnaire    Fear of Current or Ex-Partner: No    Emotionally Abused: No    Physically Abused: No    Sexually Abused: No    Review of Systems   See HPI, otherwise negative ROS  Physical Exam: BP 138/78 (BP Location: Left Arm, Patient Position: Sitting, Cuff Size: Normal)   Pulse (!) 114   Temp 97.7 F (36.5 C) (Temporal)   Ht 5' 2.5 (1.588 m)   Wt 115 lb 12.8 oz (52.5 kg)   BMI 20.84 kg/m  General:   Well lady significantly kyphotic.  Short stature.  Conversant and appears comfortable without acute distress. Neck:  Supple; no masses or thyromegaly. No significant cervical adenopathy. Lungs:  Clear throughout to auscultation.   No wheezes, crackles, or rhonchi. No acute distress. Heart:  Regular rate and rhythm; no murmurs, clicks, rubs,  or gallops. Abdomen: Non-distended, normal bowel sounds.  Soft and nontender without appreciable mass or hepatosplenomegaly.  Pulses:  Normal pulses noted. Extremities:  Without clubbing or edema. Rectum: Good sphincter tone.  No mass in rectal vault flecks of firm stool Hemoccult negative.  Impression/Plan:   78 year old lady with numerous cardiac comorbidities presents with a 32-month history of postprandial abdominal pain which has insidiously worsened somewhat of a fear of eating and a 15 pound weight loss going back to May of this year. Has prandial abdominal pain and weight loss are concerning.  First and  foremost would be concerned about intestinal angina/mesenteric ischemia.  Occult neoplasia is also in the differential.  Symptoms not consistent with biliary tract disease.  Gallbladder is out.  Nothing impressive on physical exam today.   Recommendations:  You have abdominal pain after you eat and weight loss.  This is concerning and further evaluation is needed.  You previously were prescribed Prevacid  30 mg daily for reflux.  If you are not taking that medication any longer, we will go ahead and prescribe Protonix  40 mg pill take (1) 30 minutes before lunch daily.  Dispense 30 with 11 refills.  We will proceed with a CT angiogram of abdomen and pelvis to evaluate for intestinal angina and multiple other potential causes of abdominal pain  Will get this arranged to soon as possible.  Further recommendations to follow pending review of pathology report    Notice: This dictation was prepared with Dragon dictation along with smaller phrase technology. Any transcriptional errors that result from this process are unintentional and may not be corrected upon review.

## 2024-10-21 NOTE — Patient Instructions (Signed)
 It was nice to see you again today!  You have abdominal pain after you eat and weight loss.  This is concerning and further evaluation is needed.  You previously were prescribed Prevacid  30 mg daily for reflux.  If you are not taking that medication any longer, we will go ahead and prescribe Protonix  40 mg pill take (1) 30 minutes before lunch daily.  Dispense 30 with 11 refills.  We will proceed with a CT angiogram of abdomen and pelvis to evaluate for intestinal angina and multiple other potential causes of abdominal pain  Will get this arranged to soon as possible.  Further recommendations to follow pending review of pathology report

## 2024-10-22 NOTE — Telephone Encounter (Signed)
 Pt informed of CTA for Friday 10/24/24, arrive at 3:45 pm to check in at [facility].

## 2024-10-24 ENCOUNTER — Ambulatory Visit (HOSPITAL_COMMUNITY): Admission: RE | Admit: 2024-10-24 | Discharge: 2024-10-24 | Attending: Internal Medicine | Admitting: Internal Medicine

## 2024-10-24 DIAGNOSIS — R634 Abnormal weight loss: Secondary | ICD-10-CM

## 2024-10-24 DIAGNOSIS — R1010 Upper abdominal pain, unspecified: Secondary | ICD-10-CM

## 2024-10-24 LAB — POCT I-STAT CREATININE: Creatinine, Ser: 1 mg/dL (ref 0.44–1.00)

## 2024-10-24 MED ORDER — IOHEXOL 350 MG/ML SOLN
100.0000 mL | Freq: Once | INTRAVENOUS | Status: AC | PRN
  Administered 2024-10-24: 100 mL via INTRAVENOUS

## 2024-10-27 ENCOUNTER — Ambulatory Visit: Payer: Self-pay | Admitting: Internal Medicine

## 2024-10-31 ENCOUNTER — Ambulatory Visit

## 2024-11-03 ENCOUNTER — Inpatient Hospital Stay

## 2024-11-03 ENCOUNTER — Ambulatory Visit: Attending: Student in an Organized Health Care Education/Training Program

## 2024-11-03 DIAGNOSIS — I5022 Chronic systolic (congestive) heart failure: Secondary | ICD-10-CM

## 2024-11-03 DIAGNOSIS — Z95810 Presence of automatic (implantable) cardiac defibrillator: Secondary | ICD-10-CM

## 2024-11-04 NOTE — Progress Notes (Signed)
 EPIC Encounter for ICM Monitoring  Patient Name: Katherine Larson is a 78 y.o. female Date: 11/04/2024 Primary Care Physican: Teresa Channel, MD Primary Cardiologist: Rolan Electrophysiologist: Almetta BiV Pacing: 96% 01/25/2024 Office Weight: 126 lbs     04/17/2024 Office Weight: 128 lbs    06/24/2024 Weight: 128 lbs (125 lbs baseline)        07/07/2024 Weight: 128 lbs (125 lbs on 8/17) 09/24/2024 Weight: 120 lbs      11/04/2024 Weight: 115 lbs                                     Spoke with patient and heart failure questions reviewed.  Transmission results reviewed.  Pt asymptomatic for fluid accumulation.  She does have a cold at this time.    Since 09/22/2024 ICM Remote Transmission:  CorVue thoracic impedance suggesting normal fluid levels with the exception 10/10/2024-10/16/2024.    Prescribed:  Furosemide  40 mg take 0.5 tablet(s) (20 mg total) by mouth daily.   Labs: 10/24/2024 Creatinine 1.00 09/08/2024 Creatinine 1.10, BUN 25, Potassium 4.8, Sodium 141, GFR 52  08/27/2024 Creatinine 1.00, BUN 19, Potassium 4.7, Sodium 140, GFR 58  07/24/2024 Creatinine 1.20, BUN 29, Potassium 4.8, Sodium 137, GFR 47 07/08/2024 Creatinine 1.34, BUN 28, Potassium 4.8, Sodium 139, GFR 41 A complete set of results can be found in Results Review.   Recommendations:  No changes and encouraged to call if experiencing any fluid symptoms.   Follow-up plan: ICM clinic phone appointment on 12/08/2024.   91 day device clinic remote transmission 11/13/2024.     EP/Cardiology Office Visits:  Recall 04/06/2025 with Dr Rolan.   Recall 04/01/2025 with Charlies Riling, PA.   Copy of ICM check sent to Dr. Almetta.  Remote monitoring is medically necessary for Heart Failure Management.    Daily Thoracic Impedance ICM trend: 08/05/2024 through 11/03/2024.    12-14 Month Thoracic Impedance ICM trend:     Mitzie GORMAN Garner, RN 11/04/2024 1:47 PM

## 2024-11-10 ENCOUNTER — Other Ambulatory Visit: Payer: Self-pay

## 2024-11-10 DIAGNOSIS — I739 Peripheral vascular disease, unspecified: Secondary | ICD-10-CM

## 2024-11-11 ENCOUNTER — Encounter: Payer: Self-pay | Admitting: Cardiology

## 2024-11-13 ENCOUNTER — Ambulatory Visit

## 2024-11-13 DIAGNOSIS — I255 Ischemic cardiomyopathy: Secondary | ICD-10-CM | POA: Diagnosis not present

## 2024-11-14 ENCOUNTER — Other Ambulatory Visit (HOSPITAL_COMMUNITY): Payer: Self-pay | Admitting: Cardiology

## 2024-11-14 ENCOUNTER — Ambulatory Visit: Payer: Self-pay | Admitting: Student in an Organized Health Care Education/Training Program

## 2024-11-14 LAB — CUP PACEART REMOTE DEVICE CHECK
Battery Remaining Longevity: 60 mo
Battery Remaining Percentage: 89 %
Battery Voltage: 2.98 V
Brady Statistic AP VP Percent: 4.2 %
Brady Statistic AP VS Percent: 1 %
Brady Statistic AS VP Percent: 92 %
Brady Statistic AS VS Percent: 3.7 %
Brady Statistic RA Percent Paced: 4.1 %
Date Time Interrogation Session: 20251225124815
HighPow Impedance: 51 Ohm
Implantable Lead Connection Status: 753985
Implantable Lead Connection Status: 753985
Implantable Lead Connection Status: 753985
Implantable Lead Implant Date: 20220318
Implantable Lead Implant Date: 20250625
Implantable Lead Implant Date: 20250625
Implantable Lead Location: 753858
Implantable Lead Location: 753859
Implantable Lead Location: 753860
Implantable Pulse Generator Implant Date: 20250625
Lead Channel Impedance Value: 350 Ohm
Lead Channel Impedance Value: 450 Ohm
Lead Channel Impedance Value: 640 Ohm
Lead Channel Pacing Threshold Amplitude: 0.5 V
Lead Channel Pacing Threshold Amplitude: 0.5 V
Lead Channel Pacing Threshold Amplitude: 2.25 V
Lead Channel Pacing Threshold Pulse Width: 0.5 ms
Lead Channel Pacing Threshold Pulse Width: 0.5 ms
Lead Channel Pacing Threshold Pulse Width: 1 ms
Lead Channel Sensing Intrinsic Amplitude: 12 mV
Lead Channel Sensing Intrinsic Amplitude: 2.6 mV
Lead Channel Setting Pacing Amplitude: 1.5 V
Lead Channel Setting Pacing Amplitude: 2.5 V
Lead Channel Setting Pacing Amplitude: 3.25 V
Lead Channel Setting Pacing Pulse Width: 0.5 ms
Lead Channel Setting Pacing Pulse Width: 1 ms
Lead Channel Setting Sensing Sensitivity: 0.5 mV
Pulse Gen Serial Number: 211047337
Zone Setting Status: 755011

## 2024-11-14 NOTE — Progress Notes (Signed)
 Remote ICD Transmission

## 2024-11-16 ENCOUNTER — Ambulatory Visit
Admission: EM | Admit: 2024-11-16 | Discharge: 2024-11-16 | Disposition: A | Discharge status: Home / Self Care | Attending: Family Medicine | Admitting: Family Medicine

## 2024-11-16 DIAGNOSIS — J069 Acute upper respiratory infection, unspecified: Secondary | ICD-10-CM

## 2024-11-16 DIAGNOSIS — Z8701 Personal history of pneumonia (recurrent): Secondary | ICD-10-CM | POA: Diagnosis not present

## 2024-11-16 DIAGNOSIS — J441 Chronic obstructive pulmonary disease with (acute) exacerbation: Secondary | ICD-10-CM | POA: Diagnosis not present

## 2024-11-16 DIAGNOSIS — R062 Wheezing: Secondary | ICD-10-CM

## 2024-11-16 LAB — POC COVID19/FLU A&B COMBO
Covid Antigen, POC: NEGATIVE
Influenza A Antigen, POC: NEGATIVE
Influenza B Antigen, POC: NEGATIVE

## 2024-11-16 MED ORDER — AZELASTINE HCL 0.1 % NA SOLN: 1.0000 | Freq: Two times a day (BID) | NASAL | 0 refills | Status: AC

## 2024-11-16 MED ORDER — PROMETHAZINE-DM 6.25-15 MG/5ML PO SYRP: 5.0000 mL | ORAL_SOLUTION | Freq: Four times a day (QID) | ORAL | 0 refills | Status: AC | PRN

## 2024-11-16 MED ORDER — DOXYCYCLINE HYCLATE 100 MG PO CAPS: 100.0000 mg | ORAL_CAPSULE | Freq: Two times a day (BID) | ORAL | 0 refills | Status: DC

## 2024-11-16 NOTE — ED Triage Notes (Signed)
 Pt reports she woke up this morning wheezing, chest congestion and tightness, and body aches

## 2024-11-16 NOTE — Discharge Instructions (Signed)
 I have grabbed a cough syrup and a nasal spray for you to start in addition to Coricidin HBP, plain Mucinex  and other over-the-counter remedies.  I have also sent over an antibiotic in case her symptoms significantly worsen by the end of the week.  Do not start taking this until at least the end of the week to give your symptoms time to hopefully go away on their own.  This is only if your symptoms start worsening at the end of the week.  Continue your albuterol  inhaler every 4 hours as needed for wheezing and chest tightness.

## 2024-11-17 ENCOUNTER — Encounter: Payer: Self-pay | Admitting: *Deleted

## 2024-11-18 ENCOUNTER — Other Ambulatory Visit (HOSPITAL_COMMUNITY): Payer: Self-pay | Admitting: Cardiology

## 2024-11-18 MED ORDER — SACUBITRIL-VALSARTAN 24-26 MG PO TABS: 1.0000 | ORAL_TABLET | Freq: Two times a day (BID) | ORAL | 6 refills | Status: AC

## 2024-11-19 NOTE — ED Provider Notes (Signed)
 " [facility]    CSN: [id number] Arrival date & time: 11/16/24  1414      History   Chief Complaint No chief complaint on file.   HPI Katherine Larson is a 78 y.o. female.   Patient presenting today with 1 day history of wheezing, chest congestion, body aches, fatigue.  Denies fever, chest pain, shortness of breath, abdominal pain, diarrhea, vomiting.  So far trying over-the-counter remedies with minimal relief.  Complicated past medical history to include CHF, diabetes, history of pneumonia requiring hospitalizations, history of acute respiratory failure.  History of COPD on albuterol  and Trelegy.    Past Medical History:  Diagnosis Date   AICD (automatic cardioverter/defibrillator) present    Anemia    anemia of chronic disease +/- IDA followed by Dr. Gatha. received iron  infusions in the past   Borderline glaucoma    CAD (coronary artery disease)    a. s/p CABG 1996. b. low risk nuc 2011. c. LHC 04/2016 due to drop in EF -> occluded native LAD,  widely patent sequential LIMA-D1-LAD.   Chronic kidney disease    mild stage of kidney disease   Chronic systolic CHF (congestive heart failure) (HCC)    Complication of anesthesia    DM2 (diabetes mellitus, type 2) (HCC)    not on any medicine for this at this time, 05/2016   Dyspnea    with exertion   GERD (gastroesophageal reflux disease)    Gout    History of blood transfusion    History of hiatal hernia    in 20s   History of pneumonia    March, 2016   HLD (hyperlipidemia)    HTN (hypertension)    Inappropriate sinus tachycardia    LBBB (left bundle branch block)    a. 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 Dx 04/2016 - EF 25-30%, diffuse HK, elevated LVEDP, mild MR, mod LAE.   Normocytic anemia 10/17/2021   NSVT (nonsustained ventricular  tachycardia) (HCC)    PAT (paroxysmal atrial tachycardia)    Pneumonia    PONV (postoperative nausea and vomiting)    after just about every surgery I've had   Rheumatoid arthritis (HCC)    RA- hands   Seasonal allergies     Patient Active Problem List   Diagnosis Date Noted   Arthritis of right knee 06/30/2024   Primary osteoarthritis of right knee 06/27/2024   Bacteremia 02/01/2023   Pseudomonas sepsis (HCC) 01/30/2023   Acute respiratory failure with hypoxia (HCC) 01/28/2023   Multifocal pneumonia 01/28/2023   Melena 11/28/2022   Rectal bleeding 11/28/2022   Upper abdominal pain 11/28/2022   Early satiety 07/27/2022   Nausea without vomiting 07/27/2022   Hypotension 02/10/2022   Normocytic anemia 10/17/2021   Lymphocytic colitis 07/02/2020   Coronary artery disease, occlusive: CTO proxLAD 03/26/2020   Abnormal nuclear stress test 03/26/2020   Chronic diarrhea 03/02/2020   NICM- new drop in EF 25-30% 05/03/2016   Acute pulmonary edema (HCC)    LBBB (left bundle branch block) 04/29/2016   Chronic HFrEF (heart failure with reduced ejection fraction) (HCC) 04/29/2016   Abdominal pain, chronic, epigastric 03/07/2016   Septic shock (HCC)    COPD exacerbation (HCC) 01/21/2016   Hyperthyroidism 01/21/2016   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   Hypokalemia  01/19/2016   Hyponatremia 01/19/2016   Diet-controlled diabetes mellitus (HCC) 01/19/2016   CAP (community acquired pneumonia) 01/18/2016   Mucosal abnormality of stomach    Dysphagia    Constipation 12/08/2015   Gastroesophageal reflux disease 12/08/2015   Abnormal CT scan, esophagus 12/08/2015   Esophageal dysphagia 12/08/2015   Abdominal pain, epigastric 12/08/2015   Loss of weight 12/08/2015   Lower abdominal pain 12/08/2015   Back pain 08/26/2015   Headache 08/26/2015   Essential hypertension 08/26/2015   DM type 2 (diabetes mellitus, type 2) (HCC) 08/26/2015   Sinus tachycardia 06/07/2015   Diarrhea 06/07/2015    Preop cardiovascular exam 07/16/2013   Atrial flutter (HCC) 07/16/2013   Anemia 01/08/2013   Leukocytosis 12/17/2012   DYSPNEA 02/02/2010   DIABETES MELLITUS 07/28/2009   Hyperlipidemia 07/28/2009   CHOLECYSTECTOMY, HX OF 07/28/2009   Hx of CABG '96- cath 05/01/16 07/28/2009   HERNIORRHAPHY, HX OF 07/28/2009    Past Surgical History:  Procedure Laterality Date   ABDOMINAL HYSTERECTOMY     APPENDECTOMY     BACK SURGERY     BIOPSY  04/27/2020   Procedure: BIOPSY;  Surgeon: Shaaron Lamar HERO, MD;  Location: [facility];  Service: Endoscopy;;  ascending colon biopsy   BIV UPGRADE N/A 05/14/2024   Procedure: BIV UPGRADE;  Surgeon: Waddell Danelle ORN, MD;  Location: [facility];  Service: Cardiovascular;  Laterality: N/A;   CARDIAC CATHETERIZATION N/A 05/01/2016   Procedure: Right/Left Heart Cath and Coronary/Graft Angiography;  Surgeon: Alm ORN Clay, MD;  Location: [facility] INVASIVE CV LAB;  Service: Cardiovascular;  Laterality: N/A;   CHOLECYSTECTOMY     COLONOSCOPY  11/2011   Dr. Magod:ext/int hemorrhoids, diverticulosis sigmoid colon and distal desc colon, mid-desc colon hyperplastic polyps.    COLONOSCOPY WITH PROPOFOL  N/A 04/27/2020   Rourk: Diverticulosis, hemorrhoids, normal terminal ileum.  Random colon biopsies significant for chronic lymphocytic colitis. No repeat due to age.   CORONARY ARTERY BYPASS GRAFT  1996   2 vessels   ESOPHAGOGASTRODUODENOSCOPY  11/2011   Dr. Rosalie: small hiatal hernia, one non-bleeding superficial gastric ulcer, medium-sized diverticulum in area of papilla   ESOPHAGOGASTRODUODENOSCOPY N/A 12/29/2015   Dr. Shaaron: Chronic inactive gastritis, normal esophagus status post dilation, duodenal diverticula   ESOPHAGOGASTRODUODENOSCOPY (EGD) WITH PROPOFOL  N/A 07/24/2019   Dr. Shaaron: Normal esophagus status post empiric dilation, small hiatal hernia, large D2 diverticulum   ESOPHAGOGASTRODUODENOSCOPY (EGD) WITH PROPOFOL  N/A 06/03/2021   Surgeon: Shaaron Lamar HERO, MD; normal esophagus, normal stomach, normal duodenum.  Abnormal hypopharyngeal mucosa bilaterally just distal to the base of the tongue (right side greater than left), overlying mucosa appeared irregular and somewhat verrucous in appearance.  Recommended ENT evaluation.   EYE SURGERY Bilateral    cataract removal   FOOT SURGERY     removal bone spur   FRACTURE SURGERY Right    arm   GIVENS CAPSULE STUDY  11/2012   Dr. Rosalie: minimal gastritis, normal small bowel capsule   HERNIA REPAIR     hiatal hernia   ICD IMPLANT N/A 02/04/2021   Procedure: ICD IMPLANT;  Surgeon: Waddell Danelle ORN, MD;  Location: [facility] INVASIVE CV LAB;  Service: Cardiovascular;  Laterality: N/A;   MALONEY DILATION N/A 07/24/2019   Procedure: AGAPITO DILATION;  Surgeon: Shaaron Lamar HERO, MD;  Location: [facility];  Service: Endoscopy;  Laterality: N/A;   MALONEY DILATION N/A 06/03/2021   Procedure: AGAPITO DILATION;  Surgeon: Shaaron Lamar HERO, MD;  Location: [facility];  Service:  Endoscopy;  Laterality: N/A;   MAXIMUM ACCESS (MAS)POSTERIOR LUMBAR INTERBODY FUSION (PLIF) 1 LEVEL N/A 08/14/2013   Procedure:  MAXIMUM ACCESS SURGERY(MAS) POSTERIOR LUMBAR INTERBODY FUSION LUMBAR THREE-FOUR ;  Surgeon: Alm GORMAN Molt, MD;  Location: [facility];  Service: Neurosurgery;  Laterality: N/A;   MAXIMUM ACCESS SURGERY(MAS) POSTERIOR LUMBAR INTERBODY FUSION LUMBAR THREE-FOUR    PTCA     RIGHT/LEFT HEART CATH AND CORONARY ANGIOGRAPHY N/A 03/26/2020   Procedure: RIGHT/LEFT HEART CATH AND CORONARY ANGIOGRAPHY;  Surgeon: Anner Alm ORN, MD;  Location: [facility] INVASIVE CV LAB;  Service: Cardiovascular;  Laterality: N/A;   SPINAL CORD STIMULATOR BATTERY EXCHANGE N/A 01/19/2021   Procedure: Spinal cord stimulator battery change;  Surgeon: Darlis Deatrice GORMAN, MD;  Location: [facility] OR;  Service: Neurosurgery;  Laterality: N/A;   SPINAL CORD STIMULATOR INSERTION N/A 06/16/2016   Procedure: LUMBAR SPINAL CORD STIMULATOR INSERTION;  Surgeon: Deward Fabian, MD;  Location: [facility];  Service: Neurosurgery;  Laterality: N/A;  LUMBAR SPINAL CORD STIMULATOR INSERTION   TEE WITHOUT CARDIOVERSION N/A 02/01/2023   Procedure: TRANSESOPHAGEAL ECHOCARDIOGRAM (TEE);  Surgeon: Debera Jayson MATSU, MD;  Location: [facility];  Service: Cardiovascular;  Laterality: N/A;   tens  05/2016   TONSILLECTOMY     TOTAL KNEE ARTHROPLASTY Right 06/30/2024   Procedure: ARTHROPLASTY, KNEE, TOTAL;  Surgeon: Yvone Rush, MD;  Location: [facility];  Service: Orthopedics;  Laterality: Right;  RIGHT TOTAL KNEE ARTHROPLASTY    OB History   No obstetric history on file.      Home Medications    Prior to Admission medications  Medication Sig Start Date End Date Taking? Authorizing Provider  azelastine  (ASTELIN ) 0.1 % nasal spray Place 1 spray into both nostrils 2 (two) times daily. Use in each nostril as directed 11/16/24  Yes Stuart Vernell Norris, PA-C  doxycycline  (VIBRAMYCIN ) 100 MG capsule Take 1 capsule (100 mg total) by mouth 2 (two) times daily. 11/20/24  Yes Stuart Vernell Norris, PA-C  promethazine -dextromethorphan  (PROMETHAZINE -DM) 6.25-15 MG/5ML syrup Take 5 mLs by mouth 4 (four) times daily as needed. 11/16/24  Yes Stuart Vernell Norris, PA-C  acetaminophen  (TYLENOL ) 500 MG tablet Take 1 tablet (500 mg total) by mouth every 6 (six) hours as needed. 07/24/24   Suellen Cantor A, PA-C  albuterol  (VENTOLIN  HFA) 108 (90 Base) MCG/ACT inhaler Inhale 1-2 puffs into the lungs every 6 (six) hours as needed for wheezing or shortness of breath. 03/01/24   Enedelia Dorna HERO, FNP  allopurinol  (ZYLOPRIM ) 300 MG tablet Take 300 mg by mouth daily.    [provider]  aspirin  EC 325 MG tablet Take 1 tablet (325 mg total) by mouth 2 (two) times daily. 07/01/24   Orlando Camellia POUR, PA-C  atorvastatin  (LIPITOR ) 40 MG tablet Take 1 tablet (40 mg total) by mouth daily. 08/25/24 11/24/24  Rolan Ezra GORMAN, MD  budesonide -formoterol  (SYMBICORT ) 160-4.5 MCG/ACT inhaler Inhale 2  puffs into the lungs in the morning and at bedtime. 07/22/24   Mannam, Praveen, MD  carvedilol  (COREG ) 6.25 MG tablet Take 1 tablet (6.25 mg total) by mouth 2 (two) times daily with a meal. 04/24/24   Milford, Harlene HERO, FNP  cholecalciferol (VITAMIN D3) 25 MCG (1000 UT) tablet Take 1,000 Units by mouth daily.    [provider]  ciprofloxacin  (CIPRO ) 250 MG tablet 1 tablet Orally every 12 hrs; Duration: 5 days Patient not taking: Reported on 10/21/2024 07/21/24   [provider]  docusate sodium  (COLACE) 100 MG capsule Take 1 capsule (100 mg  total) by mouth 2 (two) times daily. 07/01/24 07/01/25  Orlando Camellia POUR, PA-C  doxycycline  (VIBRA -TABS) 100 MG tablet Take 100 mg by mouth 2 (two) times daily. 09/01/24   [provider]  etanercept (ENBREL SURECLICK) 50 MG/ML injection Inject 50 mg into the skin every Monday. 07/17/22   [provider]  Evolocumab  (REPATHA  SURECLICK) 140 MG/ML SOAJ Inject 140 mg into the skin every 14 (fourteen) days. 06/05/24   Rolan Ezra RAMAN, MD  FARXIGA  10 MG TABS tablet TAKE 1 TABLET(10 MG) BY MOUTH DAILY BEFORE BREAKFAST 04/16/24   McLean, Dalton S, MD  ferrous sulfate 325 (65 FE) MG tablet Take 325 mg by mouth daily with breakfast.    [provider]  fexofenadine (ALLEGRA) 180 MG tablet Take 180 mg by mouth daily.    [provider]  fluticasone  (FLONASE ) 50 MCG/ACT nasal spray Place 1 spray into both nostrils daily. 04/18/23   [provider]  furosemide  (LASIX ) 40 MG tablet Take 0.5 tablets (20 mg total) by mouth daily. 03/17/24   Rolan Ezra RAMAN, MD  gabapentin  (NEURONTIN ) 300 MG capsule Take 300 mg by mouth 2 (two) times daily.    [provider]  lansoprazole  (PREVACID ) 30 MG capsule Take 1 capsule (30 mg total) by mouth daily before breakfast. 04/15/24   Ezzard Sonny RAMAN, PA-C  leflunomide  (ARAVA ) 10 MG tablet Take 10 mg by mouth daily.    [provider]  losartan  (COZAAR ) 100 MG tablet Take 1  tablet (100 mg total) by mouth daily. 06/12/24   Bensimhon, Toribio SAUNDERS, MD  MAGNESIUM -OXIDE 400 (240 Mg) MG tablet TAKE 1 TABLET(400 MG) BY MOUTH DAILY 12/12/21   Delford Maude BROCKS, MD  meclizine  (ANTIVERT ) 12.5 MG tablet Take 12.5 mg by mouth 3 (three) times daily as needed for dizziness.    [provider]  mirtazapine (REMERON) 15 MG tablet Take 15 mg by mouth at bedtime. 10/13/24   [provider]  montelukast  (SINGULAIR ) 10 MG tablet Take 10 mg by mouth at bedtime.    [provider]  Multiple Vitamins-Minerals (PRESERVISION AREDS 2) CAPS Take 1 capsule by mouth 2 (two) times daily.    [provider]  nitroGLYCERIN  (NITROSTAT ) 0.4 MG SL tablet DISSOLVE 1 TABLET UNDER THE  TONGUE EVERY 5 MINUTES AS NEEDED FOR CHEST PAIN. MAX OF 3 TABLETS IN 15 MINUTES. CALL 911 IF PAIN  PERSISTS. 01/10/22   Waddell Danelle ORN, MD  ondansetron  (ZOFRAN -ODT) 4 MG disintegrating tablet 1 tablet on the tongue and allow to dissolve Orally Once a day; Duration: 30 day(s) 08/16/24   [provider]  oxyCODONE  (ROXICODONE ) 5 MG immediate release tablet Take 1 tablet (5 mg total) by mouth every 4 (four) hours as needed for severe pain (pain score 7-10). 07/01/24   Orlando Camellia POUR, PA-C  oxyCODONE -acetaminophen  (PERCOCET) 10-325 MG tablet Take 1 tablet by mouth in the morning, at noon, in the evening, and at bedtime.    [provider]  pantoprazole  (PROTONIX ) 40 MG tablet Take 1 tablet (40 mg total) by mouth daily. 10/21/24   Rourk, Lamar HERO, MD  sacubitril -valsartan  (ENTRESTO ) 24-26 MG Take 1 tablet by mouth 2 (two) times daily. 11/18/24   Rolan Ezra RAMAN, MD  spironolactone  (ALDACTONE ) 25 MG tablet Take 0.5 tablets (12.5 mg total) by mouth daily. 07/08/24   Rolan Ezra RAMAN, MD  tiZANidine  (ZANAFLEX ) 2 MG tablet Take 1 tablet (2 mg total) by mouth every 6 (six) hours as needed. 07/01/24   Orlando Camellia  K, PA-C  TRELEGY ELLIPTA 200-62.5-25 MCG/ACT AEPB Inhale 1 puff into the lungs  daily. 07/23/24   [provider]  VASCEPA  1 g capsule TAKE 1 CAPSULE(1 GRAM) BY MOUTH TWICE DAILY 02/18/24   McLean, Dalton S, MD  vitamin B-12 (CYANOCOBALAMIN ) 250 MCG tablet Take 250 mcg by mouth daily.    [provider]    Family History Family History  Problem Relation Age of Onset   Heart attack Father    Heart attack Mother    Bladder Cancer Brother    Cervical cancer Daughter        ?   Colon cancer Neg Hx     Social History Social History[1]   Allergies   Biaxin [clarithromycin], Penicillins, Hydroxychloroquine , Sulfamethoxazole, Sulfasalazine, and Losartan  potassium   Review of Systems Review of Systems PER HPI  Physical Exam Triage Vital Signs ED Triage Vitals  Encounter Vitals Group     BP 11/16/24 1515 (!) 142/98     Girls Systolic BP Percentile --      Girls Diastolic BP Percentile --      Boys Systolic BP Percentile --      Boys Diastolic BP Percentile --      Pulse Rate 11/16/24 1515 69     Resp 11/16/24 1515 20     Temp 11/16/24 1515 97.9 F (36.6 C)     Temp Source 11/16/24 1515 Oral     SpO2 11/16/24 1515 92 %     Weight --      Height --      Head Circumference --      Peak Flow --      Pain Score 11/16/24 1514 0     Pain Loc --      Pain Education --      Exclude from Growth Chart --    No data found.  Updated Vital Signs BP (!) 142/98 (BP Location: Right Arm)   Pulse 69   Temp 97.9 F (36.6 C) (Oral)   Resp 20   SpO2 92%   Visual Acuity Right Eye Distance:   Left Eye Distance:   Bilateral Distance:    Right Eye Near:   Left Eye Near:    Bilateral Near:     Physical Exam Vitals and nursing note reviewed.  Constitutional:      Appearance: Normal appearance.  HENT:     Head: Atraumatic.     Right Ear: Tympanic membrane and external ear normal.     Left Ear: Tympanic membrane and external ear normal.     Nose: Rhinorrhea present.     Mouth/Throat:     Mouth: Mucous membranes are moist.     Pharynx: No  posterior oropharyngeal erythema.  Eyes:     Extraocular Movements: Extraocular movements intact.     Conjunctiva/sclera: Conjunctivae normal.  Cardiovascular:     Rate and Rhythm: Normal rate and regular rhythm.     Heart sounds: Normal heart sounds.  Pulmonary:     Effort: Pulmonary effort is normal.     Breath sounds: Normal breath sounds. No wheezing.  Musculoskeletal:        General: Normal range of motion.     Cervical back: Normal range of motion and neck supple.  Skin:    General: Skin is warm and dry.  Neurological:     Mental Status: She is alert and oriented to person, place, and time.  Psychiatric:        Mood and Affect: Mood  normal.        Thought Content: Thought content normal.    UC Treatments / Results  Labs (all labs ordered are listed, but only abnormal results are displayed) Labs Reviewed  POC COVID19/FLU A&B COMBO    EKG   Radiology No results found.  Procedures Procedures (including critical care time)  Medications Ordered in UC Medications - No data to display  Initial Impression / Assessment and Plan / UC Course  I have reviewed the triage vital signs and the nursing notes.  Pertinent labs & imaging results that were available during my care of the patient were reviewed by me and considered in my medical decision making (see chart for details).     Minimally hypertensive in triage, otherwise vital signs reassuring and within normal limits.  She is well-appearing and in no acute distress.  Rapid flu and COVID-negative, however do suspect viral respiratory infection causing a COPD exacerbation.  She has a history of severe pneumonia episodes so we will prescribe a course of doxycycline  to be started in 5 to 6 days if symptoms progressively worsening but in the meantime, Phenergan  DM, Astelin , Coricidin HBP, plain Mucinex  and other remedies over-the-counter.  Return for worsening or unresolving symptoms.  Final Clinical Impressions(s) / UC  Diagnoses   Final diagnoses:  Viral URI with cough  Wheezing  History of pneumonia  COPD exacerbation (HCC)     Discharge Instructions      I have grabbed a cough syrup and a nasal spray for you to start in addition to Coricidin HBP, plain Mucinex  and other over-the-counter remedies.  I have also sent over an antibiotic in case her symptoms significantly worsen by the end of the week.  Do not start taking this until at least the end of the week to give your symptoms time to hopefully go away on their own.  This is only if your symptoms start worsening at the end of the week.  Continue your albuterol  inhaler every 4 hours as needed for wheezing and chest tightness.    ED Prescriptions     Medication Sig Dispense Auth. Provider   azelastine  (ASTELIN ) 0.1 % nasal spray Place 1 spray into both nostrils 2 (two) times daily. Use in each nostril as directed 30 mL Stuart Vernell Norris, PA-C   promethazine -dextromethorphan  (PROMETHAZINE -DM) 6.25-15 MG/5ML syrup Take 5 mLs by mouth 4 (four) times daily as needed. 100 mL Stuart Vernell Norris, PA-C   doxycycline  (VIBRAMYCIN ) 100 MG capsule Take 1 capsule (100 mg total) by mouth 2 (two) times daily. 14 capsule Stuart Vernell Norris, [place]      PDMP not reviewed this encounter.    [1]  Social History Tobacco Use   Smoking status: Former    Current packs/day: 0.00    Average packs/day: 0.8 packs/day for 15.0 years (11.3 ttl pk-yrs)    Types: Cigarettes    Start date: 11/21/1979    Quit date: 11/20/1994    Years since quitting: 30.0   Smokeless tobacco: Never   Tobacco comments:    Quit in 1996  Vaping Use   Vaping status: Never Used  Substance Use Topics   Alcohol  use: Never    Alcohol /week: 0.0 standard drinks of alcohol    Drug use: Never     Stuart Vernell Norris, PA-C 11/19/24 1021  "

## 2024-12-01 ENCOUNTER — Inpatient Hospital Stay

## 2024-12-01 ENCOUNTER — Inpatient Hospital Stay: Attending: Physician Assistant

## 2024-12-04 NOTE — Progress Notes (Signed)
 "  Patient ID: Katherine Larson, female   DOB: 10-Sep-1946, 79 y.o.   MRN: [id number]  Reason for Consult: No chief complaint on file.   Referred by Teresa Channel, MD  Subjective:     HPI Katherine Larson is a 79 y.o. female presenting for evaluation of an incidental finding of a right common iliac stenosis.  This was noted on the CTA that was obtained for generalized abdominal pain in December.  She denies any previous or current history of claudication, rest pain or nonhealing wounds.  She denies any significant pain in her feet or toes.  She is a former smoker and quit in the 90s.  She has not had any previous vascular interventions.  She reports she has been having constant postprandial abdominal pain associated with nausea, vomiting and sometimes diarrhea.  She is unable to hold much of anything down and sometimes unable to even get out of bed due to abdominal pain.  Past Medical History:  Diagnosis Date   AICD (automatic cardioverter/defibrillator) present    Anemia    anemia of chronic disease +/- IDA followed by Dr. Gatha. received iron  infusions in the past   Borderline glaucoma    CAD (coronary artery disease)    a. s/p CABG 1996. b. low risk nuc 2011. c. LHC 04/2016 due to drop in EF -> occluded native LAD,  widely patent sequential LIMA-D1-LAD.   Chronic kidney disease    mild stage of kidney disease   Chronic systolic CHF (congestive heart failure) (HCC)    Complication of anesthesia    DM2 (diabetes mellitus, type 2) (HCC)    not on any medicine for this at this time, 05/2016   Dyspnea    with exertion   GERD (gastroesophageal reflux disease)    Gout    History of blood transfusion    History of hiatal hernia    in 20s   History of pneumonia    March, 2016   HLD (hyperlipidemia)    HTN (hypertension)    Inappropriate sinus tachycardia    LBBB (left bundle branch block)    a. Seen in 04/2016   Lymphocytic colitis 04/2020   Macular degeneration    left    Myocardial infarction [facility])    1996   Neuropathy    Neuropathy    NICM (nonischemic cardiomyopathy) (HCC)    a. Dx 04/2016 - EF 25-30%, diffuse HK, elevated LVEDP, mild MR, mod LAE.   Normocytic anemia 10/17/2021   NSVT (nonsustained ventricular tachycardia) (HCC)    PAT (paroxysmal atrial tachycardia)    Pneumonia    PONV (postoperative nausea and vomiting)    after just about every surgery I've had   Rheumatoid arthritis (HCC)    RA- hands   Seasonal allergies    Family History  Problem Relation Age of Onset   Heart attack Father    Heart attack Mother    Bladder Cancer Brother    Cervical cancer Daughter        ?   Colon cancer Neg Hx    Past Surgical History:  Procedure Laterality Date   ABDOMINAL HYSTERECTOMY     APPENDECTOMY     BACK SURGERY     BIOPSY  04/27/2020   Procedure: BIOPSY;  Surgeon: Shaaron Lamar HERO, MD;  [facility];  Service: Endoscopy;;  ascending colon biopsy   BIV UPGRADE N/A 05/14/2024   Procedure: BIV UPGRADE;  Surgeon: Waddell Danelle ORN, MD;  Location: [facility] INVASIVE  CV LAB;  Service: Cardiovascular;  Laterality: N/A;   CARDIAC CATHETERIZATION N/A 05/01/2016   Procedure: Right/Left Heart Cath and Coronary/Graft Angiography;  Surgeon: Alm LELON Clay, MD;  Location: [facility] INVASIVE CV LAB;  Service: Cardiovascular;  Laterality: N/A;   CHOLECYSTECTOMY     COLONOSCOPY  11/2011   Dr. Magod:ext/int hemorrhoids, diverticulosis sigmoid colon and distal desc colon, mid-desc colon hyperplastic polyps.    COLONOSCOPY WITH PROPOFOL  N/A 04/27/2020   Rourk: Diverticulosis, hemorrhoids, normal terminal ileum.  Random colon biopsies significant for chronic lymphocytic colitis. No repeat due to age.   CORONARY ARTERY BYPASS GRAFT  1996   2 vessels   ESOPHAGOGASTRODUODENOSCOPY  11/2011   Dr. Rosalie: small hiatal hernia, one non-bleeding superficial gastric ulcer, medium-sized diverticulum in area of papilla   ESOPHAGOGASTRODUODENOSCOPY N/A 12/29/2015   Dr.  Shaaron: Chronic inactive gastritis, normal esophagus status post dilation, duodenal diverticula   ESOPHAGOGASTRODUODENOSCOPY (EGD) WITH PROPOFOL  N/A 07/24/2019   Dr. Shaaron: Normal esophagus status post empiric dilation, small hiatal hernia, large D2 diverticulum   ESOPHAGOGASTRODUODENOSCOPY (EGD) WITH PROPOFOL  N/A 06/03/2021   Surgeon: Shaaron Lamar HERO, MD; normal esophagus, normal stomach, normal duodenum.  Abnormal hypopharyngeal mucosa bilaterally just distal to the base of the tongue (right side greater than left), overlying mucosa appeared irregular and somewhat verrucous in appearance.  Recommended ENT evaluation.   EYE SURGERY Bilateral    cataract removal   FOOT SURGERY     removal bone spur   FRACTURE SURGERY Right    arm   GIVENS CAPSULE STUDY  11/2012   Dr. Rosalie: minimal gastritis, normal small bowel capsule   HERNIA REPAIR     hiatal hernia   ICD IMPLANT N/A 02/04/2021   Procedure: ICD IMPLANT;  Surgeon: Waddell Danelle LELON, MD;  Location: [facility] INVASIVE CV LAB;  Service: Cardiovascular;  Laterality: N/A;   MALONEY DILATION N/A 07/24/2019   Procedure: AGAPITO DILATION;  Surgeon: Shaaron Lamar HERO, MD;  [facility];  Service: Endoscopy;  Laterality: N/A;   MALONEY DILATION N/A 06/03/2021   Procedure: AGAPITO DILATION;  Surgeon: Shaaron Lamar HERO, MD;  Location: [facility];  Service: Endoscopy;  Laterality: N/A;   MAXIMUM ACCESS (MAS)POSTERIOR LUMBAR INTERBODY FUSION (PLIF) 1 LEVEL N/A 08/14/2013   Procedure:  MAXIMUM ACCESS SURGERY(MAS) POSTERIOR LUMBAR INTERBODY FUSION LUMBAR THREE-FOUR ;  Surgeon: Alm GORMAN Molt, MD;  Location: [facility] NEURO ORS;  Service: Neurosurgery;  Laterality: N/A;   MAXIMUM ACCESS SURGERY(MAS) POSTERIOR LUMBAR INTERBODY FUSION LUMBAR THREE-FOUR    PTCA     RIGHT/LEFT HEART CATH AND CORONARY ANGIOGRAPHY N/A 03/26/2020   Procedure: RIGHT/LEFT HEART CATH AND CORONARY ANGIOGRAPHY;  Surgeon: Clay Alm LELON, MD;  Location: [facility] INVASIVE CV LAB;  Service:  Cardiovascular;  Laterality: N/A;   SPINAL CORD STIMULATOR BATTERY EXCHANGE N/A 01/19/2021   Procedure: Spinal cord stimulator battery change;  Surgeon: Darlis Deatrice GORMAN, MD;  Location: [facility] OR;  Service: Neurosurgery;  Laterality: N/A;   SPINAL CORD STIMULATOR INSERTION N/A 06/16/2016   Procedure: LUMBAR SPINAL CORD STIMULATOR INSERTION;  Surgeon: Deward Fabian, MD;  Location: [facility];  Service: Neurosurgery;  Laterality: N/A;  LUMBAR SPINAL CORD STIMULATOR INSERTION   TEE WITHOUT CARDIOVERSION N/A 02/01/2023   Procedure: TRANSESOPHAGEAL ECHOCARDIOGRAM (TEE);  Surgeon: Debera Jayson MATSU, MD;  Location: [facility] OR[facility];  Service: Cardiovascular;  Laterality: N/A;   tens  05/2016   TONSILLECTOMY     TOTAL KNEE ARTHROPLASTY Right 06/30/2024   Procedure: ARTHROPLASTY, KNEE, TOTAL;  Surgeon: Yvone Rush, MD;  Location: [facility];  Service: Orthopedics;  Laterality: Right;  RIGHT TOTAL KNEE ARTHROPLASTY    Short Social History:  Social History   Tobacco Use   Smoking status: Former    Current packs/day: 0.00    Average packs/day: 0.8 packs/day for 15.0 years (11.3 ttl pk-yrs)    Types: Cigarettes    Start date: 11/21/1979    Quit date: 11/20/1994    Years since quitting: 30.0   Smokeless tobacco: Never   Tobacco comments:    Quit in 1996  Substance Use Topics   Alcohol  use: Never    Alcohol /week: 0.0 standard drinks of alcohol     Allergies[1]  Current Outpatient Medications  Medication Sig Dispense Refill   acetaminophen  (TYLENOL ) 500 MG tablet Take 1 tablet (500 mg total) by mouth every 6 (six) hours as needed. 30 tablet 0   albuterol  (VENTOLIN  HFA) 108 (90 Base) MCG/ACT inhaler Inhale 1-2 puffs into the lungs every 6 (six) hours as needed for wheezing or shortness of breath. 18 g 0   allopurinol  (ZYLOPRIM ) 300 MG tablet Take 300 mg by mouth daily.     aspirin  EC 325 MG tablet Take 1 tablet (325 mg total) by mouth 2 (two) times daily. 30 tablet 0   atorvastatin  (LIPITOR ) 40 MG tablet Take 1  tablet (40 mg total) by mouth daily. 90 tablet 3   azelastine  (ASTELIN ) 0.1 % nasal spray Place 1 spray into both nostrils 2 (two) times daily. Use in each nostril as directed 30 mL 0   budesonide -formoterol  (SYMBICORT ) 160-4.5 MCG/ACT inhaler Inhale 2 puffs into the lungs in the morning and at bedtime. 3 each 3   carvedilol  (COREG ) 6.25 MG tablet Take 1 tablet (6.25 mg total) by mouth 2 (two) times daily with a meal. 180 tablet 3   cholecalciferol (VITAMIN D3) 25 MCG (1000 UT) tablet Take 1,000 Units by mouth daily.     ciprofloxacin  (CIPRO ) 250 MG tablet 1 tablet Orally every 12 hrs; Duration: 5 days (Patient not taking: Reported on 10/21/2024)     docusate sodium  (COLACE) 100 MG capsule Take 1 capsule (100 mg total) by mouth 2 (two) times daily. 60 capsule 2   doxycycline  (VIBRA -TABS) 100 MG tablet Take 100 mg by mouth 2 (two) times daily.     doxycycline  (VIBRAMYCIN ) 100 MG capsule Take 1 capsule (100 mg total) by mouth 2 (two) times daily. 14 capsule 0   etanercept (ENBREL SURECLICK) 50 MG/ML injection Inject 50 mg into the skin every Monday.     Evolocumab  (REPATHA  SURECLICK) 140 MG/ML SOAJ Inject 140 mg into the skin every 14 (fourteen) days. 6 mL 3   FARXIGA  10 MG TABS tablet TAKE 1 TABLET(10 MG) BY MOUTH DAILY BEFORE BREAKFAST 30 tablet 10   ferrous sulfate 325 (65 FE) MG tablet Take 325 mg by mouth daily with breakfast.     fexofenadine (ALLEGRA) 180 MG tablet Take 180 mg by mouth daily.     fluticasone  (FLONASE ) 50 MCG/ACT nasal spray Place 1 spray into both nostrils daily.     furosemide  (LASIX ) 40 MG tablet Take 0.5 tablets (20 mg total) by mouth daily. 45 tablet 3   gabapentin  (NEURONTIN ) 300 MG capsule Take 300 mg by mouth 2 (two) times daily.     lansoprazole  (PREVACID ) 30 MG capsule Take 1 capsule (30 mg total) by mouth daily before breakfast. 90 capsule 3   leflunomide  (ARAVA ) 10 MG tablet Take 10 mg by mouth daily.     losartan  (COZAAR ) 100 MG tablet Take  1 tablet (100 mg total)  by mouth daily. 90 tablet 1   MAGNESIUM -OXIDE 400 (240 Mg) MG tablet TAKE 1 TABLET(400 MG) BY MOUTH DAILY 30 tablet 2   meclizine  (ANTIVERT ) 12.5 MG tablet Take 12.5 mg by mouth 3 (three) times daily as needed for dizziness.     mirtazapine (REMERON) 15 MG tablet Take 15 mg by mouth at bedtime.     montelukast  (SINGULAIR ) 10 MG tablet Take 10 mg by mouth at bedtime.     Multiple Vitamins-Minerals (PRESERVISION AREDS 2) CAPS Take 1 capsule by mouth 2 (two) times daily.     nitroGLYCERIN  (NITROSTAT ) 0.4 MG SL tablet DISSOLVE 1 TABLET UNDER THE  TONGUE EVERY 5 MINUTES AS NEEDED FOR CHEST PAIN. MAX OF 3 TABLETS IN 15 MINUTES. CALL 911 IF PAIN  PERSISTS. 75 tablet 4   ondansetron  (ZOFRAN -ODT) 4 MG disintegrating tablet 1 tablet on the tongue and allow to dissolve Orally Once a day; Duration: 30 day(s)     oxyCODONE  (ROXICODONE ) 5 MG immediate release tablet Take 1 tablet (5 mg total) by mouth every 4 (four) hours as needed for severe pain (pain score 7-10). 30 tablet 0   oxyCODONE -acetaminophen  (PERCOCET) 10-325 MG tablet Take 1 tablet by mouth in the morning, at noon, in the evening, and at bedtime.     pantoprazole  (PROTONIX ) 40 MG tablet Take 1 tablet (40 mg total) by mouth daily. 30 tablet 11   promethazine -dextromethorphan  (PROMETHAZINE -DM) 6.25-15 MG/5ML syrup Take 5 mLs by mouth 4 (four) times daily as needed. 100 mL 0   sacubitril -valsartan  (ENTRESTO ) 24-26 MG Take 1 tablet by mouth 2 (two) times daily. 60 tablet 6   spironolactone  (ALDACTONE ) 25 MG tablet Take 0.5 tablets (12.5 mg total) by mouth daily. 45 tablet 3   tiZANidine  (ZANAFLEX ) 2 MG tablet Take 1 tablet (2 mg total) by mouth every 6 (six) hours as needed. 60 tablet 0   TRELEGY ELLIPTA 200-62.5-25 MCG/ACT AEPB Inhale 1 puff into the lungs daily.     VASCEPA  1 g capsule TAKE 1 CAPSULE(1 GRAM) BY MOUTH TWICE DAILY 120 capsule 10   vitamin B-12 (CYANOCOBALAMIN ) 250 MCG tablet Take 250 mcg by mouth daily.     No current  facility-administered medications for this visit.    REVIEW OF SYSTEMS  All other systems were reviewed and are negative     Objective:  Objective   There were no vitals filed for this visit. There is no height or weight on file to calculate BMI.  Physical Exam General: no acute distress Cardiac: hemodynamically stable Extremities: no edema, cyanosis or wounds Vascular:   Right: 1+ palpable DP  Left: 1+ palpable DP  Data: ABI +---------+------------------+-----+---------+--------+  Right   Rt Pressure (mmHg)IndexWaveform Comment   +---------+------------------+-----+---------+--------+  Brachial 175                                       +---------+------------------+-----+---------+--------+  PTA                            triphasic          +---------+------------------+-----+---------+--------+  DP                             triphasic          +---------+------------------+-----+---------+--------+  Burnetta Gibbons  0.38                    +---------+------------------+-----+---------+--------+   +---------+------------------+-----+---------+-------+  Left    Lt Pressure (mmHg)IndexWaveform Comment  +---------+------------------+-----+---------+-------+  Brachial 178                                      +---------+------------------+-----+---------+-------+  PTA                            triphasic         +---------+------------------+-----+---------+-------+  DP                             triphasic         +---------+------------------+-----+---------+-------+  [place]106               0.60                   +---------+------------------+-----+---------+-------+   CTA independently reviewed.   Medial calcinosis noted in the SMA, celiac and renals but without flow-limiting stenosis. Medial calcinosis of the infrarenal aorta and bilateral common iliac arteries.  On the right there is a very  mild stenosis, less than 50%.  Both internal iliacs are patent but the left has a severe calcific stenosis.      Assessment/Plan:   Katherine Larson is a 79 y.o. female with aortoiliac atherosclerosis and medial calcinosis. I explained that she does have hardening of her arteries but there is no flow-limiting stenosis in the mesenteric vessels.  The SMA and celiac arteries are widely patent as well as the renal arteries.  She does have some mild stenoses through the common iliacs although she has triphasic waveforms on her ABI and a faintly palpable DP pulses. I explained that with widely patent SMA and celiac arteries her abdominal pain is not related to any perfusion issue. Given that she is asymptomatic from a PAD perspective but she does have some mild stenoses through the iliacs we will continue with annual surveillance for PAD.  She has also not had any carotid screening.  Plan for follow-up in 12 months with a repeat ABI and carotid duplex.  Recommendations to optimize cardiovascular risk: Abstinence from all tobacco products. Blood glucose control with goal A1c < 7%. Blood pressure control with goal blood pressure < 140/90 mmHg. Lipid reduction therapy with goal LDL-C <55 mg/dL  Aspirin  81mg  PO QD.  Atorvastatin  40-80mg  PO QD (or other high intensity statin therapy).   Norman GORMAN Serve MD [facility]     [1]  Allergies Allergen Reactions   Biaxin [Clarithromycin] Rash and Other (See Comments)    Blisters in mouth    Penicillins Rash and Other (See Comments)    Blisters in mouth Has patient had a PCN reaction causing immediate rash, facial/tongue/throat swelling, SOB or lightheadedness with hypotension: Yesyes Has patient had a PCN reaction causing severe rash involving mucus membranes or skin necrosis: Nono Has patient had a PCN reaction that required hospitalization Nono Has patient had a PCN reaction occurring within the last 10 years:  Nono If all of the above answers are NO, then may proceed   Hydroxychloroquine  Other (See Comments)   Sulfamethoxazole     Other Reaction(s): Other (See Comments)   Sulfasalazine Other (See Comments)  Losartan  Potassium     Other reaction(s): elevated creatinine   "

## 2024-12-05 ENCOUNTER — Ambulatory Visit: Admitting: Vascular Surgery

## 2024-12-05 ENCOUNTER — Ambulatory Visit (HOSPITAL_COMMUNITY)
Admission: RE | Admit: 2024-12-05 | Discharge: 2024-12-05 | Disposition: A | Discharge status: Home / Self Care | Source: Ambulatory Visit | Attending: Surgery | Admitting: Surgery

## 2024-12-05 ENCOUNTER — Encounter: Payer: Self-pay | Admitting: Vascular Surgery

## 2024-12-05 VITALS — BP 167/81 | HR 105 | Temp 97.9°F

## 2024-12-05 DIAGNOSIS — I739 Peripheral vascular disease, unspecified: Secondary | ICD-10-CM

## 2024-12-05 LAB — VAS US ABI WITH/WO TBI

## 2024-12-08 ENCOUNTER — Ambulatory Visit: Attending: Student in an Organized Health Care Education/Training Program

## 2024-12-08 DIAGNOSIS — I5022 Chronic systolic (congestive) heart failure: Secondary | ICD-10-CM

## 2024-12-08 DIAGNOSIS — Z95810 Presence of automatic (implantable) cardiac defibrillator: Secondary | ICD-10-CM | POA: Diagnosis not present

## 2024-12-08 NOTE — Progress Notes (Signed)
 EPIC Encounter for ICM Monitoring  Patient Name: Katherine Larson is a 79 y.o. female Date: 12/08/2024 Primary Care Physican: Teresa Channel, MD Primary Cardiologist: Rolan Electrophysiologist: Almetta BiV Pacing: 97% 01/25/2024 Office Weight: 126 lbs     04/17/2024 Office Weight: 128 lbs    06/24/2024 Weight: 128 lbs (125 lbs baseline)        07/07/2024 Weight: 128 lbs (125 lbs on 8/17) 09/24/2024 Weight: 120 lbs      11/04/2024 Weight: 115 lbs       12/08/2024 Weight: 111 lbs                               Spoke with patient and heart failure questions reviewed.  Transmission results reviewed.  Pt asymptomatic for fluid accumulation.  She's had diarrhea since 12/04/2024 which correlates with impedance.  She has been taking a Pepto Bismol and Kaopectate but she continues to have diarrhea.     Since 11/03/2024 ICM Remote Transmission:  CorVue thoracic impedance suggesting possible dryness starting 12/04/2024.  Suggesting possible fluid accumulation from 11/06/2024-11/21/2024.    Prescribed:  Furosemide  40 mg take 0.5 tablet(s) (20 mg total) by mouth daily.   Labs: 10/24/2024 Creatinine 1.00 09/08/2024 Creatinine 1.10, BUN 25, Potassium 4.8, Sodium 141, GFR 52  08/27/2024 Creatinine 1.00, BUN 19, Potassium 4.7, Sodium 140, GFR 58  07/24/2024 Creatinine 1.20, BUN 29, Potassium 4.8, Sodium 137, GFR 47 07/08/2024 Creatinine 1.34, BUN 28, Potassium 4.8, Sodium 139, GFR 41 A complete set of results can be found in Results Review.   Recommendations:  She held Lasix  today and will do so tomorrow due to diarrhea and trying to increase fluid intake.  She has a PCP appt at the end of this week.  Advised to either ask Dr Keneth office, PCP or pharmacists what is safe for her to take.     Follow-up plan: ICM clinic phone appointment on 12/16/2024 to recheck fluid levels.   91 day device clinic remote transmission 02/12/2025.     EP/Cardiology Office Visits:  Recall 04/06/2025 with Dr Rolan.   Recall  04/01/2025 with Charlies Riling, PA.   Copy of ICM check sent to Dr. Almetta.  Remote monitoring is medically necessary for Heart Failure Management.    Daily Thoracic Impedance ICM trend: 09/09/2024 through 12/08/2024.    12-14 Month Thoracic Impedance ICM trend:     Mitzie GORMAN Garner, RN 12/08/2024 4:11 PM

## 2024-12-09 ENCOUNTER — Telehealth (HOSPITAL_COMMUNITY): Payer: Self-pay | Admitting: Cardiology

## 2024-12-09 NOTE — Telephone Encounter (Signed)
 Pt aware Add on 1/21

## 2024-12-09 NOTE — Telephone Encounter (Signed)
 Patient left message on triage line reporting dehydration.   Reports she spoke with device nurse 1/19, [employer] reports confirms some dehydration. Reports she has experienced diarrhea x 2-3 days- reports she has used pepto and kaopectate.   Held lasix  1/19 and 1/20  Please see device reading 1/19 and advise if additional changes are needed

## 2024-12-09 NOTE — Telephone Encounter (Signed)
 Agree with holding lasix . She should stay off the diuretic for now. Has lost a lot of weight recently.   She is overdue for follow-up please arrange. May need to cut back meds further.

## 2024-12-10 ENCOUNTER — Ambulatory Visit (HOSPITAL_COMMUNITY)
Admission: RE | Admit: 2024-12-10 | Discharge: 2024-12-10 | Disposition: A | Discharge status: Home / Self Care | Source: Ambulatory Visit | Attending: Cardiology | Admitting: Cardiology

## 2024-12-10 ENCOUNTER — Other Ambulatory Visit (HOSPITAL_COMMUNITY): Payer: Self-pay | Admitting: Internal Medicine

## 2024-12-10 ENCOUNTER — Ambulatory Visit (HOSPITAL_COMMUNITY): Payer: Self-pay | Admitting: Cardiology

## 2024-12-10 ENCOUNTER — Encounter (HOSPITAL_COMMUNITY): Payer: Self-pay | Admitting: Cardiology

## 2024-12-10 VITALS — BP 126/74 | HR 95 | Ht 62.5 in | Wt 115.6 lb

## 2024-12-10 DIAGNOSIS — Z79899 Other long term (current) drug therapy: Secondary | ICD-10-CM | POA: Diagnosis not present

## 2024-12-10 DIAGNOSIS — I2510 Atherosclerotic heart disease of native coronary artery without angina pectoris: Secondary | ICD-10-CM | POA: Insufficient documentation

## 2024-12-10 DIAGNOSIS — J9611 Chronic respiratory failure with hypoxia: Secondary | ICD-10-CM | POA: Diagnosis not present

## 2024-12-10 DIAGNOSIS — I5022 Chronic systolic (congestive) heart failure: Secondary | ICD-10-CM | POA: Diagnosis not present

## 2024-12-10 DIAGNOSIS — Z95810 Presence of automatic (implantable) cardiac defibrillator: Secondary | ICD-10-CM | POA: Diagnosis not present

## 2024-12-10 DIAGNOSIS — Z8616 Personal history of COVID-19: Secondary | ICD-10-CM | POA: Diagnosis not present

## 2024-12-10 DIAGNOSIS — Z7984 Long term (current) use of oral hypoglycemic drugs: Secondary | ICD-10-CM | POA: Insufficient documentation

## 2024-12-10 DIAGNOSIS — I447 Left bundle-branch block, unspecified: Secondary | ICD-10-CM | POA: Diagnosis not present

## 2024-12-10 DIAGNOSIS — I110 Hypertensive heart disease with heart failure: Secondary | ICD-10-CM | POA: Diagnosis not present

## 2024-12-10 DIAGNOSIS — M069 Rheumatoid arthritis, unspecified: Secondary | ICD-10-CM | POA: Diagnosis not present

## 2024-12-10 DIAGNOSIS — Z7982 Long term (current) use of aspirin: Secondary | ICD-10-CM | POA: Insufficient documentation

## 2024-12-10 DIAGNOSIS — Z87891 Personal history of nicotine dependence: Secondary | ICD-10-CM | POA: Diagnosis not present

## 2024-12-10 DIAGNOSIS — Z951 Presence of aortocoronary bypass graft: Secondary | ICD-10-CM | POA: Diagnosis not present

## 2024-12-10 DIAGNOSIS — R197 Diarrhea, unspecified: Secondary | ICD-10-CM | POA: Diagnosis not present

## 2024-12-10 LAB — BASIC METABOLIC PANEL WITH GFR
Anion gap: 12 (ref 5–15)
BUN: 24 mg/dL — ABNORMAL HIGH (ref 8–23)
CO2: 23 mmol/L (ref 22–32)
Calcium: 9.4 mg/dL (ref 8.9–10.3)
Chloride: 104 mmol/L (ref 98–111)
Creatinine, Ser: 0.98 mg/dL (ref 0.44–1.00)
GFR, Estimated: 59 mL/min — ABNORMAL LOW
Glucose, Bld: 90 mg/dL (ref 70–99)
Potassium: 4.9 mmol/L (ref 3.5–5.1)
Sodium: 139 mmol/L (ref 135–145)

## 2024-12-10 NOTE — Progress Notes (Signed)
 " [facility]   PCP: Teresa Channel, MD Cardiology: Dr. Delford HF Cardiology: Dr. Rolan  79 y.o. with history of CAD s/p CABG and ischemic cardiomyopathy was referred by Dr. Delford for evaluation of CHF.  Patient has a long h/o heart disease.  In 1996, she had CABG with sequential LIMA-LAD and diagonal.  Last cath in 5/21 showed totally occluded LAD with patent LIMA-LAD and diagonal, minimal disease in LCx and RCA. LV systolic function has been reduced.  She has a Secondary School Teacher ICD. Echo in 9/22 showed EF 25-30%, low normal RV function, moderate MR.  She has a chronic LBBB.  She additionally has rheumatoid arthritis and significant low back pain for which she has a spinal stimulator.   She tried to take Entresto  in the past and was very tired/fatigued on it.  She was unable to tolerate it even at low dose and stopped it.  She was lightheaded when we tried to increase Coreg  to 12.5 mg bid.   Follow up 1/24, NYHA II and volume stable. 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  Admitted 4/24 with CAP, BCx + for Pseudomonas aeruginosa, concern for bacteriemia in presence of ICD. ID and Cardiology consulted, and underwent TEE, showing LVEF 30-35%, normal RV, no LAA thrombus, no valvular or ICD vegetations. She was continued on 2 weeks of ciprofloxacin .  Echo in 4/25 showed EF 35-40%, normal RV.   St Jude device upgraded to CRT-D in 6/25.  S/p right TKR 8/25.  Echo 10/25 showed EF 45-50%, normal RV.  She presents today for an acute work in visit. She has had diarrhea over the last 6 days and feels that she may be dehydrated. She held lasix  yesterday and today. Still taking Farxiga  and Entresto . She denies any known sick contact. No fever, chills. Only mild abdominal cramping. No recent abx use. BP normotensive at 126/74. She feels weak but no orthostatic symptoms. She has been trying to stay  hydrated by drinking Gatorade and water . Not eating much solids currently.   Device interrogation shows normal impedence. No VT/VF. 97% BiV pacing, personally reviewed   She has f/u w/ her PCP on Friday.  Labs (2/25): K 4.4, creatinine 1.04 Labs (4/25): hgb 10.4, LDL 122  Labs (5/25): K 5.3, creatinine 1.11 Labs (7/25): K 4.3, creatinine 0.92 Labs (8/25): LDL 6 Labs (10/25): K 4.8, creatinine 1.10  PMH: 1. HTN 2. Hyperlipidemia 3. GERD 4. Gout 5. Fe deficiency anemia 6. CAD: CABG in 1996 with sequential LIMA-diagonal and LAD.  - LHC (5/21): totally occluded LAD with patent sequent LIMA-D and LAD, no significant LCx or RCA disease.  7. Chronic systolic CHF: Ischemic cardiomyopathy.  St Jude CRT-D device.  - Echo (9/22): EF 25-30%, low normal RV function, moderate MR.  - Echo (2/24): EF 30-35%, normal RV - TEE (3/24): LVEF 30-35%, normal RV, no LAA thrombus, no valvular or ICD vegetations. - Echo (4/25): EF 35-40%, normal RV - Echo (10/25): EF 45-50%, normal RV 8. Low back pain: Arthritis, has spinal stimulator.  9. Chronic LBBB 10. Rheumatoid arthritis.  11. COPD: Prior smoker.   Social History   Socioeconomic History   Marital status: Married    Spouse name: Not on file   Number of children: 3   Years of education: Not on file   Highest education level: Not on file  Occupational History   Not on file  Tobacco Use   Smoking status: Former    Current packs/day: 0.00    Average packs/day: 0.8 packs/day for 15.0 years (11.3 ttl pk-yrs)    Types: Cigarettes    Start date: 11/21/1979    Quit date: 11/20/1994    Years since quitting: 30.0   Smokeless tobacco: Never   Tobacco comments:    Quit in 1996  Vaping Use   Vaping status: Never Used  Substance and Sexual Activity   Alcohol  use: Never    Alcohol /week: 0.0 standard drinks of alcohol    Drug use: Never   Sexual activity: Yes  Other Topics Concern   Not on file  Social History Narrative   2 living children, one  deceased age 44   Right handed   One story home   Drinks coffee am   Social Drivers of Health   Tobacco Use: Medium Risk (12/10/2024)   Patient History    Smoking Tobacco Use: Former    Smokeless Tobacco Use: Never    Passive Exposure: Not on Actuary Strain: Not on file  Food Insecurity: No Food Insecurity (06/30/2024)   Epic    Worried About Programme Researcher, Broadcasting/film/video in the Last Year: Never true    Ran Out of Food in the Last Year: Never true  Transportation Needs: No Transportation Needs (06/30/2024)   Epic    Lack of Transportation (Medical): No    Lack of Transportation (Non-Medical): No  Physical Activity: Not on file  Stress: Not on file  Social Connections: Moderately Isolated (06/30/2024)   Social Connection and Isolation Panel    Frequency of Communication with Friends and Family: More than three times a week    Frequency of Social Gatherings with Friends and Family: More than three times a week    Attends Religious Services: Never    Database Administrator or Organizations: No    Attends Banker Meetings: Never    Marital Status: Married  Catering Manager Violence: Not At Risk (06/30/2024)   Epic    Fear of Current or Ex-Partner: No    Emotionally Abused: No    Physically Abused: No    Sexually Abused: No  Depression (PHQ2-9): Low Risk (09/08/2024)   Depression (PHQ2-9)    PHQ-2 Score: 0  Alcohol  Screen: Not on file  Housing: Low Risk (06/30/2024)   Epic    Unable to Pay for Housing in the Last Year: No    Number of Times Moved in the Last Year: 0    Homeless in the Last Year: No  Utilities: Not At Risk (06/30/2024)   Epic    Threatened with loss of utilities: No  Health Literacy: Not on file   Family History  Problem Relation Age of Onset   Heart attack Father    Heart attack Mother    Bladder Cancer Brother    Cervical cancer Daughter        ?   Colon cancer Neg Hx    ROS: All systems reviewed and negative except as per HPI.    Current Outpatient Medications  Medication Sig Dispense Refill   acetaminophen  (TYLENOL ) 500 MG tablet Take 1 tablet (500 mg total) by mouth every 6 (six) hours as needed. 30 tablet 0   albuterol  (VENTOLIN  HFA) 108 (90 Base) MCG/ACT inhaler Inhale 1-2 puffs into the lungs every 6 (six) hours as needed for wheezing or shortness of breath. 18 g 0   allopurinol  (ZYLOPRIM ) 300 MG tablet Take 300  mg by mouth daily.     aspirin  EC 325 MG tablet Take 1 tablet (325 mg total) by mouth 2 (two) times daily. 30 tablet 0   atorvastatin  (LIPITOR ) 40 MG tablet Take 1 tablet (40 mg total) by mouth daily. 90 tablet 3   azelastine  (ASTELIN ) 0.1 % nasal spray Place 1 spray into both nostrils 2 (two) times daily. Use in each nostril as directed 30 mL 0   budesonide -formoterol  (SYMBICORT ) 160-4.5 MCG/ACT inhaler Inhale 2 puffs into the lungs in the morning and at bedtime. 3 each 3   carvedilol  (COREG ) 6.25 MG tablet Take 1 tablet (6.25 mg total) by mouth 2 (two) times daily with a meal. 180 tablet 3   cholecalciferol (VITAMIN D3) 25 MCG (1000 UT) tablet Take 1,000 Units by mouth daily.     docusate sodium  (COLACE) 100 MG capsule Take 1 capsule (100 mg total) by mouth 2 (two) times daily. 60 capsule 2   Evolocumab  (REPATHA  SURECLICK) 140 MG/ML SOAJ Inject 140 mg into the skin every 14 (fourteen) days. 6 mL 3   FARXIGA  10 MG TABS tablet TAKE 1 TABLET(10 MG) BY MOUTH DAILY BEFORE BREAKFAST 30 tablet 10   ferrous sulfate 325 (65 FE) MG tablet Take 325 mg by mouth daily with breakfast.     fexofenadine (ALLEGRA) 180 MG tablet Take 180 mg by mouth daily.     fluticasone  (FLONASE ) 50 MCG/ACT nasal spray Place 1 spray into both nostrils daily.     furosemide  (LASIX ) 40 MG tablet Take 0.5 tablets (20 mg total) by mouth daily. 45 tablet 3   gabapentin  (NEURONTIN ) 300 MG capsule Take 300 mg by mouth 2 (two) times daily.     lansoprazole  (PREVACID ) 30 MG capsule Take 1 capsule (30 mg total) by mouth daily before breakfast. 90  capsule 3   leflunomide  (ARAVA ) 10 MG tablet Take 10 mg by mouth daily.     MAGNESIUM -OXIDE 400 (240 Mg) MG tablet TAKE 1 TABLET(400 MG) BY MOUTH DAILY 30 tablet 2   meclizine  (ANTIVERT ) 12.5 MG tablet Take 12.5 mg by mouth 3 (three) times daily as needed for dizziness.     mirtazapine (REMERON) 15 MG tablet Take 15 mg by mouth at bedtime.     montelukast  (SINGULAIR ) 10 MG tablet Take 10 mg by mouth at bedtime.     Multiple Vitamins-Minerals (PRESERVISION AREDS 2) CAPS Take 1 capsule by mouth 2 (two) times daily.     nitroGLYCERIN  (NITROSTAT ) 0.4 MG SL tablet DISSOLVE 1 TABLET UNDER THE  TONGUE EVERY 5 MINUTES AS NEEDED FOR CHEST PAIN. MAX OF 3 TABLETS IN 15 MINUTES. CALL 911 IF PAIN  PERSISTS. 75 tablet 4   oxyCODONE -acetaminophen  (PERCOCET) 10-325 MG tablet Take 1 tablet by mouth in the morning, at noon, in the evening, and at bedtime.     pantoprazole  (PROTONIX ) 40 MG tablet Take 1 tablet (40 mg total) by mouth daily. 30 tablet 11   sacubitril -valsartan  (ENTRESTO ) 24-26 MG Take 1 tablet by mouth 2 (two) times daily. 60 tablet 6   spironolactone  (ALDACTONE ) 25 MG tablet Take 0.5 tablets (12.5 mg total) by mouth daily. 45 tablet 3   VASCEPA  1 g capsule TAKE 1 CAPSULE(1 GRAM) BY MOUTH TWICE DAILY 120 capsule 10   vitamin B-12 (CYANOCOBALAMIN ) 250 MCG tablet Take 250 mcg by mouth daily.     ondansetron  (ZOFRAN -ODT) 4 MG disintegrating tablet 1 tablet on the tongue and allow to dissolve Orally Once a day; Duration: 30 day(s)     promethazine -dextromethorphan  (  PROMETHAZINE -DM) 6.25-15 MG/5ML syrup Take 5 mLs by mouth 4 (four) times daily as needed. 100 mL 0   tiZANidine  (ZANAFLEX ) 2 MG tablet Take 1 tablet (2 mg total) by mouth every 6 (six) hours as needed. 60 tablet 0   No current facility-administered medications for this encounter.   Wt Readings from Last 3 Encounters:  12/10/24 52.4 kg (115 lb 9.6 oz)  10/21/24 52.5 kg (115 lb 12.8 oz)  10/08/24 53.8 kg (118 lb 9.6 oz)   BP 126/74   Pulse  95   Ht 5' 2.5 (1.588 m)   Wt 52.4 kg (115 lb 9.6 oz)   SpO2 97%   BMI 20.81 kg/m   Physical Exam  GENERAL: elderly, fatigued appearing, NAD Lungs- clear CARDIAC:  JVP not elevated          Normal rate with regular rhythm. No murmur.  No LEE  ABDOMEN: Soft, non-tender, non-distended.  EXTREMITIES: Warm and well perfused.  NEUROLOGIC: No obvious FND   Assessment/Plan: 1. Chronic systolic CHF: St Jude CRT-D device.  Probably ischemic cardiomyopathy (h/o occluded LAD, s/p CABG). Coronaries have been unchanged since CABG in 1996. However, EF has declined over the years and I worry that another process may be present.  Unable to obtain cardiac MRI with spinal cord stimulator. 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 TEE (3/24) showed LVEF 30-35%, normal RV. Echo in 2/25 showed EF 35-40%, normal RV. Echo 10/25, post CRT upgrade, showed improved EF 45-50%. Stable NYHA Class I-II symptoms. 97% BiV pacing on device interrogation. Euvolemic on exam and by CorVue. Given recent ongoing diarrhea, will check BMP to check BUN/SCr - for now, continue to hold Lasix  until diarrhea resolves, PO intake back to normal and wt trends back up (currently 5 lb below dry wt)  - if SCr/ BUN elevated, will instruct to hold Farxiga  until GI issues resolve  - Continue Entresto  24-26 mg bid. Did not tolerate higher doses in the past   - Continue spironolactone  12.5 mg daily - Continue Coreg  6.25 mg bid.  - Check BMP today  2. CAD: CABG 1996.  Cath in 5/21 due to fall in EF showed totally occluded LAD, patent RCA and LCx, patent LIMA-LAD and diagonal.  No change, no interventional target.  Denies CP   - Continue ASA 81 mg daily.  - Continue atorvastatin  80 mg daily and Repatha , LDL 6 3. Chronic Respiratory Failure with hypoxia: Admission (3/24) for multilobar PNA, BCx + pseudomonas aeruginosa. Required BiPap and eventually  discharged with home O2. CXR suggestive of chronic emphysema and scarring. Of note, had COVID 2/24. Using oxygen  at night.  4. Rheumatoid arthritis: Now s/p right TKR.  5. Diarrhea: etiology uncertain but duration of 6+ days concerning. She denies any recent abx use. She has f/u w/ PCP on Friday. If symptoms persist at time of f/u, I encouraged she discuss w/ PCP obtaining stool cultures.  - check BMP per above - encouraged continued hydration w/ fluids w/ water  and Gatorade   Will continue remote monitoring of volume status through [facility] clinic. Otherwise f/u w/ APP in 3-4 months.    Caffie Shed, PA-C  12/10/2024 "

## 2024-12-10 NOTE — Patient Instructions (Signed)
 Medication Changes:  CONTINUE TO HOLD LASIX (FUROSEMIDE ) UNTIL YOUR DIARRHEA IS RESOLVED   Lab Work:  Labs done today, your results will be available in MyChart, we will contact you for abnormal readings.  Follow-Up in: 3 months as scheduled with APP clinic   At the [facility], you and your health needs are our priority. We have a designated team specialized in the treatment of Heart Failure. This Care Team includes your primary Heart Failure Specialized Cardiologist (physician), Advanced Practice Providers (APPs- Physician Assistants and Nurse Practitioners), and Pharmacist who all work together to provide you with the care you need, when you need it.   You may see any of the following providers on your designated Care Team at your next follow up:  Dr. Toribio Fuel Dr. Ezra Shuck Dr. Odis Brownie Greig Mosses, NP Caffie Shed, [place] [facility] [place], [place] Beckey Coe, NP Jordan Lee, NP Tinnie Redman, PharmD   Please be sure to bring in all your medications bottles to every appointment.   Need to Contact Us :  If you have any questions or concerns before your next appointment please send us  a message through [place] or call our office at [phone].    TO LEAVE A MESSAGE FOR THE NURSE SELECT OPTION 2, PLEASE LEAVE A MESSAGE INCLUDING: YOUR NAME DATE OF BIRTH CALL BACK NUMBER REASON FOR CALL**this is important as we prioritize the call backs  YOU WILL RECEIVE A CALL BACK THE SAME DAY AS LONG AS YOU CALL BEFORE 4:00 PM

## 2024-12-16 ENCOUNTER — Ambulatory Visit: Admitting: Internal Medicine

## 2024-12-16 ENCOUNTER — Ambulatory Visit

## 2024-12-16 DIAGNOSIS — Z95810 Presence of automatic (implantable) cardiac defibrillator: Secondary | ICD-10-CM

## 2024-12-16 DIAGNOSIS — I5022 Chronic systolic (congestive) heart failure: Secondary | ICD-10-CM

## 2024-12-16 NOTE — Progress Notes (Signed)
 EPIC Encounter for ICM Monitoring  Patient Name: Katherine Larson is a 79 y.o. female Date: 12/16/2024 Primary Care Physican: Teresa Channel, MD Primary Cardiologist: Rolan Electrophysiologist: [place] BiV Pacing: 97% 01/25/2024 Office Weight: 126 lbs     04/17/2024 Office Weight: 128 lbs    06/24/2024 Weight: 128 lbs (125 lbs baseline)        07/07/2024 Weight: 128 lbs (125 lbs on 8/17) 09/24/2024 Weight: 120 lbs      11/04/2024 Weight: 115 lbs       12/08/2024 Weight: 111 lbs                               Spoke with patient and heart failure questions reviewed.  Transmission results reviewed.  Pt asymptomatic for fluid accumulation.  She stated the diarrhea has improved and feeling better.    Since 12/08/2024 ICM Remote Transmission:  CorVue thoracic impedance suggesting fluid levels returned to normal.   Prescribed:  Furosemide  40 mg take 0.5 tablet(s) (20 mg total) by mouth daily.   Labs: 12/10/2024 Creatinine 0.98, BUN 24, Potassium 4.9, Sodium 139, GFR 59 10/24/2024 Creatinine 1.00 09/08/2024 Creatinine 1.10, BUN 25, Potassium 4.8, Sodium 141, GFR 52  08/27/2024 Creatinine 1.00, BUN 19, Potassium 4.7, Sodium 140, GFR 58  07/24/2024 Creatinine 1.20, BUN 29, Potassium 4.8, Sodium 137, GFR 47 07/08/2024 Creatinine 1.34, BUN 28, Potassium 4.8, Sodium 139, GFR 41 A complete set of results can be found in Results Review.   Recommendations:  Advised to call physicians office for any changes in condition.     Follow-up plan: ICM clinic 31 day follow up currently on hold but 91 day remote monitoring will continue.      91 day device clinic remote transmission 02/12/2025.     EP/Cardiology Office Visits:  Recall 04/06/2025 with Dr Rolan.   Recall 04/01/2025 with Charlies Riling, PA.   Copy of ICM check sent to Dr. Almetta.   Remote monitoring is medically necessary for Heart Failure Management.    Daily Thoracic Impedance ICM trend: 09/16/2024 through 12/16/2024.    12-14 Month Thoracic  Impedance ICM trend:     Katherine Larson Garner, RN 12/16/2024 3:32 PM

## 2024-12-22 ENCOUNTER — Other Ambulatory Visit (HOSPITAL_COMMUNITY): Payer: Self-pay | Admitting: Internal Medicine

## 2024-12-25 NOTE — Progress Notes (Unsigned)
 "  [facility] [address], [place] [address]   CLINIC:  Medical Oncology/Hematology  PCP:  Teresa Channel, MD [address] [place] [address] [phone]   REASON FOR VISIT:  Follow-up for anemia of CKD/functional iron  deficiency + leukocytosis   CURRENT THERAPY: Intermittent IV iron  + Retacrit  10,000 units monthly  INTERVAL HISTORY:   Katherine Larson 79 y.o. female returns for routine follow-up of anemia of CKD/functional iron  deficiency. She was last seen by Pleasant Barefoot PA-C on 09/08/2024.  At today's visit, she reports feeling ***. She denies any interval surgeries, hospitalizations, or new diagnoses. She has had a very stressful month due to her husband being hospitalized for over a month in the ICU.*** She reports a very poor appetite and very poor energy secondary to stress.*** She has lost 6 pounds in the past 2 months, which she attributes to poor appetite and situational stressors.*** She reports *** energy and *** appetite. She is maintaining a stable weight at this time.  ***  She has not received Retacrit  injections since 08/11/2024 (Hgb >11 in October and November, and canceled her appointments in December and January).*** She denies any major side effects from her Retacrit  injections.***  Blood pressure has been at goal. *** No symptoms concerning for DVT or PE.  *** She  denies any bleeding episodes.  *** No pica, chest pain, syncope, or dyspnea on exertion.  ***  She denies any B symptoms.  No new lumps or bumps.  *** She is taking iron  tablet every other day.***  ASSESSMENT & PLAN:  1.  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   - No history of blood transfusion. - Intermittent IV iron , most recent Feraheme  on 08/03/2023 - Currently taking iron  supplement Nu-iron  every other day.***   - Reports episode of dark stool in the summer 2022, and followed with GI.   - EGD  (06/03/2021 by Dr. Shaaron): Normal esophagus, stomach, duodenum - Colonoscopy (04/27/2020 by Dr. Shaaron): Internal hemorrhoids and diverticulosis - Hemoccult stool x3 negative (December 2022) - Work-up of anemia (10/17/2021): Iron  saturation 15% but elevated ferritin 329 (high suspicion that elevated ferritin was acute phase reactant, as multiple inflammatory markers were also positive and patient has underlying RA).   Normal folate, copper , methylmalonic acid.  Elevated B12 at 2718 (likely acute phase reactant).  SPEP negative for M spike.  Immunofixation negative for monoclonal protein.  Mildly elevated kappa light chains 41.1/normal lambda 25.6, normal ratio 1.61. - No bright red blood per rectum or melena*** - Retacrit  initiated December 2024.  Current dose of Retacrit  10,000 units every 4 weeks.*** - Most recent labs (***): Hgb ***/MCV ***.  Ferritin ***, iron  saturation ***%. - DIFFERENTIAL DIAGNOSIS: Anemia related to CKD, chronic inflammation (rheumatoid arthritis) and medication (leflunomide ) - PLAN: *** TBD  - possible decrease to every 6 weeks versus discontinue Retacrit .  *** Continue CBC and Retacrit  10,000 units every 4 weeks.  If she continues to have Hgb >11, we will decrease to Retacrit  every 6 weeks. - RTC in 3 months with CBC/D, CMP, ferritin, iron /TIBC.  -- She can DECREASE iron  tablet to EOD   2.  Leukocytosis, reactive - She has a longstanding history of intermittent leukocytosis since at least 2014, felt to be reactive leukocytosis - BCR/ABL by PCR testing was negative in January 2014 - JAK2 with reflex was negative - Inflammatory markers positive (10/17/2021): CRP 2.4, ESR 76, elevated  B12 and ferritin - She had COVID-19 infection in November 2022. - She uses steroid inhaler (Trelegy) and intermittent Kenalog  cream. - She is a current non-smoker. - She has underlying rheumatoid arthritis.  She is on leflunomide , which is managed by Dr. Jon Jacob. - She denies any B  symptoms such as fever, chills, night sweats, unintentional weight loss.  No new lumps or bumps.  - Differential diagnosis favors reactive leukocytosis in the setting of rheumatoid arthritis and chronic disease - PLAN: No further work-up or intervention at this time.    3.  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 Negative for appetite change, chills, diaphoresis, fever and unexpected weight change.  HENT:   Negative for lump/mass and nosebleeds.   Eyes:  Negative for eye problems.  Respiratory:  Negative for cough, hemoptysis and shortness of breath.   Cardiovascular:  Negative for chest pain, leg swelling and palpitations.  Gastrointestinal:  Negative for abdominal pain, blood in stool, constipation, diarrhea, nausea and vomiting.  Genitourinary:  Negative for hematuria.   Musculoskeletal:  Positive for arthralgias and back pain.  Skin: Negative.   Neurological:  Positive for headaches. Negative for dizziness and light-headedness.  Hematological:  Does not bruise/bleed easily.     PHYSICAL EXAM:***  ECOG PERFORMANCE STATUS: 2 - Symptomatic, <50% confined to bed  There were no vitals filed for this visit.   There were no vitals filed for this visit.   Physical Exam Constitutional:      Appearance: Normal appearance. She is normal weight.  Cardiovascular:     Heart sounds: Normal heart sounds.  Pulmonary:     Breath sounds: Normal breath sounds.  Neurological:     General: No focal  deficit present.     Mental Status: Mental status is at baseline.  Psychiatric:        Behavior: Behavior normal. Behavior is cooperative.    PAST MEDICAL/SURGICAL HISTORY:  Past Medical History:  Diagnosis Date   AICD (automatic cardioverter/defibrillator) present    Anemia    anemia of chronic disease +/- IDA followed by Dr. Gatha. received iron  infusions in the past   Borderline glaucoma    CAD (coronary artery disease)    a. s/p CABG 1996. b. low risk nuc 2011. c. LHC 04/2016 due to drop in EF -> occluded native LAD,  widely patent sequential LIMA-D1-LAD.   Chronic kidney disease    mild stage of kidney disease   Chronic systolic CHF (congestive heart failure) (HCC)    Complication of anesthesia    DM2 (diabetes mellitus, type 2) (HCC)    not on any medicine for this at this time, 05/2016   Dyspnea    with exertion   GERD (gastroesophageal reflux disease)    Gout    History of blood transfusion    History of hiatal hernia    in 20s   History of pneumonia    March, 2016   HLD (hyperlipidemia)    HTN (hypertension)    Inappropriate sinus tachycardia    LBBB (left bundle branch  block)    a. 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 Dx 04/2016 - EF 25-30%, diffuse HK, elevated LVEDP, mild MR, mod LAE.   Normocytic anemia 10/17/2021   NSVT (nonsustained ventricular tachycardia) (HCC)    PAT (paroxysmal atrial tachycardia)    Pneumonia    PONV (postoperative nausea and vomiting)    after just about every surgery I've had   Rheumatoid arthritis (HCC)    RA- hands   Seasonal allergies    Past Surgical History:  Procedure Laterality Date   ABDOMINAL HYSTERECTOMY     APPENDECTOMY     BACK SURGERY     BIOPSY  04/27/2020   Procedure: BIOPSY;  Surgeon: Shaaron Lamar HERO, MD;  Location: [facility] ENDO SUITE;  Service:  Endoscopy;;  ascending colon biopsy   BIV UPGRADE N/A 05/14/2024   Procedure: BIV UPGRADE;  Surgeon: Waddell Danelle ORN, MD;  Location: [facility] INVASIVE CV LAB;  Service: Cardiovascular;  Laterality: N/A;   CARDIAC CATHETERIZATION N/A 05/01/2016   Procedure: Right/Left Heart Cath and Coronary/Graft Angiography;  Surgeon: Alm ORN Clay, MD;  Location: [facility] INVASIVE CV LAB;  Service: Cardiovascular;  Laterality: N/A;   CHOLECYSTECTOMY     COLONOSCOPY  11/2011   Dr. Magod:ext/int hemorrhoids, diverticulosis sigmoid colon and distal desc colon, mid-desc colon hyperplastic polyps.    COLONOSCOPY WITH PROPOFOL  N/A 04/27/2020   Rourk: Diverticulosis, hemorrhoids, normal terminal ileum.  Random colon biopsies significant for chronic lymphocytic colitis. No repeat due to age.   CORONARY ARTERY BYPASS GRAFT  1996   2 vessels   ESOPHAGOGASTRODUODENOSCOPY  11/2011   Dr. Rosalie: small hiatal hernia, one non-bleeding superficial gastric ulcer, medium-sized diverticulum in area of papilla   ESOPHAGOGASTRODUODENOSCOPY N/A 12/29/2015   Dr. Shaaron: Chronic inactive gastritis, normal esophagus status post dilation, duodenal diverticula   ESOPHAGOGASTRODUODENOSCOPY (EGD) WITH PROPOFOL  N/A 07/24/2019   Dr. Shaaron: Normal esophagus status post empiric dilation, small hiatal hernia, large D2 diverticulum   ESOPHAGOGASTRODUODENOSCOPY (EGD) WITH PROPOFOL  N/A 06/03/2021   Surgeon: Shaaron Lamar HERO, MD; normal esophagus, normal stomach, normal duodenum.  Abnormal hypopharyngeal mucosa bilaterally just distal to the base of the tongue (right side greater than left), overlying mucosa appeared irregular and somewhat verrucous in appearance.  Recommended ENT evaluation.   EYE SURGERY Bilateral    cataract removal   FOOT SURGERY     removal bone spur   FRACTURE SURGERY Right    arm   GIVENS CAPSULE STUDY  11/2012   Dr. Rosalie: minimal gastritis, normal small bowel capsule   HERNIA REPAIR     hiatal hernia   ICD IMPLANT N/A  02/04/2021   Procedure: ICD IMPLANT;  Surgeon: Waddell Danelle ORN, MD;  Location: [facility] INVASIVE CV LAB;  Service: Cardiovascular;  Laterality: N/A;   MALONEY DILATION N/A 07/24/2019   Procedure: AGAPITO DILATION;  Surgeon: Shaaron Lamar HERO, MD;  Location: [facility];  Service: Endoscopy;  Laterality: N/A;   MALONEY DILATION N/A 06/03/2021   Procedure: AGAPITO DILATION;  Surgeon: Shaaron Lamar HERO, MD;  Location: [facility];  Service: Endoscopy;  Laterality: N/A;   MAXIMUM ACCESS (MAS)POSTERIOR LUMBAR INTERBODY FUSION (PLIF) 1 LEVEL N/A 08/14/2013   Procedure:  MAXIMUM ACCESS SURGERY(MAS) POSTERIOR LUMBAR INTERBODY FUSION LUMBAR THREE-FOUR ;  Surgeon: Alm GORMAN Molt, MD;  Location: [facility] NEURO ORS;  Service: Neurosurgery;  Laterality: N/A;   MAXIMUM ACCESS SURGERY(MAS) POSTERIOR LUMBAR INTERBODY FUSION LUMBAR THREE-FOUR    PTCA     RIGHT/LEFT HEART CATH AND CORONARY ANGIOGRAPHY N/A 03/26/2020   Procedure: RIGHT/LEFT HEART CATH AND CORONARY ANGIOGRAPHY;  Surgeon: Anner Alm ORN, MD;  Location: [facility] INVASIVE CV LAB;  Service: Cardiovascular;  Laterality: N/A;   SPINAL CORD STIMULATOR BATTERY EXCHANGE N/A 01/19/2021   Procedure: Spinal cord stimulator battery change;  Surgeon: Darlis Deatrice RAMAN, MD;  Location: [facility] OR;  Service: Neurosurgery;  Laterality: N/A;   SPINAL CORD STIMULATOR INSERTION N/A 06/16/2016   Procedure: LUMBAR SPINAL CORD STIMULATOR INSERTION;  Surgeon: Deward Fabian, MD;  Location: [facility] NEURO ORS;  Service: Neurosurgery;  Laterality: N/A;  LUMBAR SPINAL CORD STIMULATOR INSERTION   TEE WITHOUT CARDIOVERSION N/A 02/01/2023   Procedure: TRANSESOPHAGEAL ECHOCARDIOGRAM (TEE);  Surgeon: Debera Jayson MATSU, MD;  Location: [facility] ORS;  Service: Cardiovascular;  Laterality: N/A;   tens  05/2016   TONSILLECTOMY     TOTAL KNEE ARTHROPLASTY Right 06/30/2024   Procedure: ARTHROPLASTY, KNEE, TOTAL;  Surgeon: Yvone Rush, MD;  Location: [facility] ORS;  Service: Orthopedics;  Laterality: Right;  RIGHT TOTAL KNEE  ARTHROPLASTY    SOCIAL HISTORY:  Social History   Socioeconomic History   Marital status: Married    Spouse name: Not on file   Number of children: 3   Years of education: Not on file   Highest education level: Not on file  Occupational History   Not on file  Tobacco Use   Smoking status: Former    Current packs/day: 0.00    Average packs/day: 0.8 packs/day for 15.0 years (11.3 ttl pk-yrs)    Types: Cigarettes    Start date: 11/21/1979    Quit date: 11/20/1994    Years since quitting: 30.1   Smokeless tobacco: Never   Tobacco comments:    Quit in 1996  Vaping Use   Vaping status: Never Used  Substance and Sexual Activity   Alcohol  use: Never    Alcohol /week: 0.0 standard drinks of alcohol    Drug use: Never   Sexual activity: Yes  Other Topics Concern   Not on file  Social History Narrative   2 living children, one deceased age 84   Right handed   One story home   Drinks coffee am   Social Drivers of Health   Tobacco Use: Medium Risk (12/10/2024)   Patient History    Smoking Tobacco Use: Former    Smokeless Tobacco Use: Never    Passive Exposure: Not on Actuary Strain: Not on file  Food Insecurity: No Food Insecurity (06/30/2024)   Epic    Worried About Programme Researcher, Broadcasting/film/video in the Last Year: Never true    Ran Out of Food in the Last Year: Never true  Transportation Needs: No Transportation Needs (06/30/2024)   Epic    Lack of Transportation (Medical): No    Lack of Transportation (Non-Medical): No  Physical Activity: Not on file  Stress: Not on file  Social Connections: Moderately Isolated (06/30/2024)   Social Connection and Isolation Panel    Frequency of Communication with Friends and Family: More than three times a week    Frequency of Social Gatherings with Friends and Family: More than three times a week    Attends Religious Services: Never    Database Administrator or Organizations: No    Attends Banker Meetings: Never     Marital Status: Married  Intimate  Partner Violence: Not At Risk (06/30/2024)   Epic    Fear of Current or Ex-Partner: No    Emotionally Abused: No    Physically Abused: No    Sexually Abused: No  Depression (PHQ2-9): Low Risk (09/08/2024)   Depression (PHQ2-9)    PHQ-2 Score: 0  Alcohol  Screen: Not on file  Housing: Low Risk (06/30/2024)   Epic    Unable to Pay for Housing in the Last Year: No    Number of Times Moved in the Last Year: 0    Homeless in the Last Year: No  Utilities: Not At Risk (06/30/2024)   Epic    Threatened with loss of utilities: No  Health Literacy: Not on file    FAMILY HISTORY:  Family History  Problem Relation Age of Onset   Heart attack Father    Heart attack Mother    Bladder Cancer Brother    Cervical cancer Daughter        ?   Colon cancer Neg Hx     CURRENT MEDICATIONS:  Outpatient Encounter Medications as of 12/29/2024  Medication Sig   acetaminophen  (TYLENOL ) 500 MG tablet Take 1 tablet (500 mg total) by mouth every 6 (six) hours as needed.   albuterol  (VENTOLIN  HFA) 108 (90 Base) MCG/ACT inhaler Inhale 1-2 puffs into the lungs every 6 (six) hours as needed for wheezing or shortness of breath.   allopurinol  (ZYLOPRIM ) 300 MG tablet Take 300 mg by mouth daily.   aspirin  EC 325 MG tablet Take 1 tablet (325 mg total) by mouth 2 (two) times daily.   atorvastatin  (LIPITOR ) 40 MG tablet Take 1 tablet (40 mg total) by mouth daily.   azelastine  (ASTELIN ) 0.1 % nasal spray Place 1 spray into both nostrils 2 (two) times daily. Use in each nostril as directed   budesonide -formoterol  (SYMBICORT ) 160-4.5 MCG/ACT inhaler Inhale 2 puffs into the lungs in the morning and at bedtime.   carvedilol  (COREG ) 6.25 MG tablet Take 1 tablet (6.25 mg total) by mouth 2 (two) times daily with a meal.   cholecalciferol (VITAMIN D3) 25 MCG (1000 UT) tablet Take 1,000 Units by mouth daily.   docusate sodium  (COLACE) 100 MG capsule Take 1 capsule (100 mg total) by mouth 2  (two) times daily.   Evolocumab  (REPATHA  SURECLICK) 140 MG/ML SOAJ Inject 140 mg into the skin every 14 (fourteen) days.   FARXIGA  10 MG TABS tablet TAKE 1 TABLET(10 MG) BY MOUTH DAILY BEFORE BREAKFAST   ferrous sulfate 325 (65 FE) MG tablet Take 325 mg by mouth daily with breakfast.   fexofenadine (ALLEGRA) 180 MG tablet Take 180 mg by mouth daily.   fluticasone  (FLONASE ) 50 MCG/ACT nasal spray Place 1 spray into both nostrils daily.   furosemide  (LASIX ) 40 MG tablet Take 0.5 tablets (20 mg total) by mouth daily.   gabapentin  (NEURONTIN ) 300 MG capsule Take 300 mg by mouth 2 (two) times daily.   lansoprazole  (PREVACID ) 30 MG capsule Take 1 capsule (30 mg total) by mouth daily before breakfast.   leflunomide  (ARAVA ) 10 MG tablet Take 10 mg by mouth daily.   MAGNESIUM -OXIDE 400 (240 Mg) MG tablet TAKE 1 TABLET(400 MG) BY MOUTH DAILY   meclizine  (ANTIVERT ) 12.5 MG tablet Take 12.5 mg by mouth 3 (three) times daily as needed for dizziness.   mirtazapine (REMERON) 15 MG tablet Take 15 mg by mouth at bedtime.   montelukast  (SINGULAIR ) 10 MG tablet Take 10 mg by mouth at bedtime.   Multiple Vitamins-Minerals (PRESERVISION  AREDS 2) CAPS Take 1 capsule by mouth 2 (two) times daily.   nitroGLYCERIN  (NITROSTAT ) 0.4 MG SL tablet DISSOLVE 1 TABLET UNDER THE  TONGUE EVERY 5 MINUTES AS NEEDED FOR CHEST PAIN. MAX OF 3 TABLETS IN 15 MINUTES. CALL 911 IF PAIN  PERSISTS.   ondansetron  (ZOFRAN -ODT) 4 MG disintegrating tablet 1 tablet on the tongue and allow to dissolve Orally Once a day; Duration: 30 day(s)   oxyCODONE -acetaminophen  (PERCOCET) 10-325 MG tablet Take 1 tablet by mouth in the morning, at noon, in the evening, and at bedtime.   pantoprazole  (PROTONIX ) 40 MG tablet Take 1 tablet (40 mg total) by mouth daily.   promethazine -dextromethorphan  (PROMETHAZINE -DM) 6.25-15 MG/5ML syrup Take 5 mLs by mouth 4 (four) times daily as needed.   sacubitril -valsartan  (ENTRESTO ) 24-26 MG Take 1 tablet by mouth 2 (two)  times daily.   spironolactone  (ALDACTONE ) 25 MG tablet Take 0.5 tablets (12.5 mg total) by mouth daily.   tiZANidine  (ZANAFLEX ) 2 MG tablet Take 1 tablet (2 mg total) by mouth every 6 (six) hours as needed.   VASCEPA  1 g capsule TAKE 1 CAPSULE(1 GRAM) BY MOUTH TWICE DAILY   vitamin B-12 (CYANOCOBALAMIN ) 250 MCG tablet Take 250 mcg by mouth daily.   No facility-administered encounter medications on file as of 12/29/2024.    ALLERGIES:  Allergies  Allergen Reactions   Biaxin [Clarithromycin] Rash and Other (See Comments)    Blisters in mouth    Penicillins Rash and Other (See Comments)    Blisters in mouth Has patient had a PCN reaction causing immediate rash, facial/tongue/throat swelling, SOB or lightheadedness with hypotension: Yesyes Has patient had a PCN reaction causing severe rash involving mucus membranes or skin necrosis: Nono Has patient had a PCN reaction that required hospitalization Nono Has patient had a PCN reaction occurring within the last 10 years: Nono If all of the above answers are NO, then may proceed   Hydroxychloroquine  Other (See Comments)   Sulfamethoxazole     Other Reaction(s): Other (See Comments)   Sulfasalazine Other (See Comments)   Losartan  Potassium     Other reaction(s): elevated creatinine    LABORATORY DATA:  I have reviewed the labs as listed.  CBC    Component Value Date/Time   WBC 10.0 10/06/2024 1320   RBC 3.48 (L) 10/06/2024 1320   HGB 11.1 (L) 10/06/2024 1320   HGB 10.8 (L) 11/29/2022 0931   HGB 12.4 11/01/2015 1134   HCT 35.2 (L) 10/06/2024 1320   HCT 33.2 (L) 11/29/2022 0931   HCT 37.4 11/01/2015 1134   PLT 207 10/06/2024 1320   PLT 229 11/29/2022 0931   MCV 101.1 (H) 10/06/2024 1320   MCV 97 11/29/2022 0931   MCV 91.7 11/01/2015 1134   MCH 31.9 10/06/2024 1320   MCHC 31.5 10/06/2024 1320   RDW 14.7 10/06/2024 1320   RDW 13.1 11/29/2022 0931   RDW 15.0 (H) 11/01/2015 1134   LYMPHSABS 2.0 09/08/2024 0737   LYMPHSABS 2.6  11/29/2022 0931   LYMPHSABS 2.9 11/01/2015 1134   MONOABS 0.8 09/08/2024 0737   MONOABS 0.7 11/01/2015 1134   EOSABS 0.3 09/08/2024 0737   EOSABS 0.3 11/29/2022 0931   BASOSABS 0.0 09/08/2024 0737   BASOSABS 0.0 11/29/2022 0931   BASOSABS 0.0 11/01/2015 1134      Latest Ref Rng & Units 12/10/2024    3:30 PM 10/24/2024    3:51 PM 09/08/2024    7:37 AM  CMP  Glucose 70 - 99 mg/dL 90   881  BUN 8 - 23 mg/dL 24   25   Creatinine 9.55 - 1.00 mg/dL 9.01  8.99  8.89   Sodium 135 - 145 mmol/L 139   141   Potassium 3.5 - 5.1 mmol/L 4.9   4.8   Chloride 98 - 111 mmol/L 104   102   CO2 22 - 32 mmol/L 23   28   Calcium  8.9 - 10.3 mg/dL 9.4   9.5   Total Protein 6.5 - 8.1 g/dL   7.1   Total Bilirubin 0.0 - 1.2 mg/dL   0.2   Alkaline Phos 38 - 126 U/L   124   AST 15 - 41 U/L   24   ALT 0 - 44 U/L   18     DIAGNOSTIC IMAGING:  I have independently reviewed the relevant imaging and discussed with the patient.   WRAP UP:  All questions were answered. The patient knows to call the clinic with any problems, questions or concerns.  Medical decision making: Moderate  Time spent on visit: I spent 20 minutes counseling the patient face to face. The total time spent in the appointment was 30 minutes and more than 50% was on counseling.  Pleasant CHRISTELLA Barefoot, PA-C  *** "

## 2024-12-29 ENCOUNTER — Inpatient Hospital Stay

## 2024-12-29 ENCOUNTER — Inpatient Hospital Stay: Admitting: Physician Assistant

## 2025-01-30 ENCOUNTER — Ambulatory Visit

## 2025-02-12 ENCOUNTER — Encounter

## 2025-03-10 ENCOUNTER — Ambulatory Visit (HOSPITAL_COMMUNITY)

## 2025-05-01 ENCOUNTER — Ambulatory Visit

## 2025-05-14 ENCOUNTER — Encounter

## 2025-07-31 ENCOUNTER — Ambulatory Visit

## 2025-08-13 ENCOUNTER — Encounter

## 2025-11-14 ENCOUNTER — Encounter

## 2026-02-13 ENCOUNTER — Encounter

## 2026-05-15 ENCOUNTER — Encounter
